# Patient Record
Sex: Female | Born: 1950
Health system: Southern US, Community
[De-identification: ages and names within clinical notes are randomized; demographics above are authoritative.]

## PROBLEM LIST (undated history)

## (undated) DIAGNOSIS — R7302 Impaired glucose tolerance (oral): Secondary | ICD-10-CM

## (undated) DIAGNOSIS — R599 Enlarged lymph nodes, unspecified: Secondary | ICD-10-CM

## (undated) DIAGNOSIS — G9341 Metabolic encephalopathy: Secondary | ICD-10-CM

## (undated) DIAGNOSIS — M25579 Pain in unspecified ankle and joints of unspecified foot: Secondary | ICD-10-CM

## (undated) DIAGNOSIS — H0259 Other disorders affecting eyelid function: Secondary | ICD-10-CM

## (undated) DIAGNOSIS — M2340 Loose body in knee, unspecified knee: Secondary | ICD-10-CM

## (undated) DIAGNOSIS — D869 Sarcoidosis, unspecified: Secondary | ICD-10-CM

## (undated) DIAGNOSIS — R0602 Shortness of breath: Secondary | ICD-10-CM

## (undated) DIAGNOSIS — J984 Other disorders of lung: Secondary | ICD-10-CM

## (undated) DIAGNOSIS — N189 Chronic kidney disease, unspecified: Secondary | ICD-10-CM

## (undated) DIAGNOSIS — E739 Lactose intolerance, unspecified: Secondary | ICD-10-CM

## (undated) DIAGNOSIS — G4710 Hypersomnia, unspecified: Secondary | ICD-10-CM

## (undated) DIAGNOSIS — F411 Generalized anxiety disorder: Secondary | ICD-10-CM

## (undated) DIAGNOSIS — M25469 Effusion, unspecified knee: Secondary | ICD-10-CM

## (undated) DIAGNOSIS — G4730 Sleep apnea, unspecified: Secondary | ICD-10-CM

## (undated) DIAGNOSIS — D649 Anemia, unspecified: Secondary | ICD-10-CM

## (undated) DIAGNOSIS — G43909 Migraine, unspecified, not intractable, without status migrainosus: Secondary | ICD-10-CM

## (undated) DIAGNOSIS — H409 Unspecified glaucoma: Secondary | ICD-10-CM

## (undated) DIAGNOSIS — Z86010 Personal history of colonic polyps: Secondary | ICD-10-CM

## (undated) DIAGNOSIS — M1990 Unspecified osteoarthritis, unspecified site: Secondary | ICD-10-CM

## (undated) DIAGNOSIS — R609 Edema, unspecified: Secondary | ICD-10-CM

## (undated) DIAGNOSIS — J302 Other seasonal allergic rhinitis: Secondary | ICD-10-CM

## (undated) DIAGNOSIS — M25569 Pain in unspecified knee: Secondary | ICD-10-CM

## (undated) DIAGNOSIS — K635 Polyp of colon: Secondary | ICD-10-CM

## (undated) DIAGNOSIS — M25519 Pain in unspecified shoulder: Secondary | ICD-10-CM

## (undated) DIAGNOSIS — F329 Major depressive disorder, single episode, unspecified: Secondary | ICD-10-CM

## (undated) DIAGNOSIS — T07XXXA Unspecified multiple injuries, initial encounter: Secondary | ICD-10-CM

## (undated) DIAGNOSIS — H269 Unspecified cataract: Secondary | ICD-10-CM

## (undated) DIAGNOSIS — R42 Dizziness and giddiness: Secondary | ICD-10-CM

## (undated) DIAGNOSIS — E785 Hyperlipidemia, unspecified: Secondary | ICD-10-CM

## (undated) DIAGNOSIS — I10 Essential (primary) hypertension: Secondary | ICD-10-CM

## (undated) DIAGNOSIS — R7303 Prediabetes: Secondary | ICD-10-CM

## (undated) HISTORY — DX: Metabolic encephalopathy: G93.41

## (undated) HISTORY — DX: Major depressive disorder, single episode, unspecified: F32.9

## (undated) HISTORY — PX: GUM SURGERY: SHX658

## (undated) HISTORY — PX: BREAST SURGERY: SHX581

## (undated) HISTORY — PX: OTHER SURGICAL HISTORY: SHX169

## (undated) HISTORY — PX: KNEE ARTHROSCOPY: SHX127

## (undated) HISTORY — DX: Enlarged lymph nodes, unspecified: R59.9

## (undated) HISTORY — DX: Morbid (severe) obesity due to excess calories: E66.01

## (undated) HISTORY — DX: Pain in unspecified ankle and joints of unspecified foot: M25.579

## (undated) HISTORY — DX: Essential (primary) hypertension: I10

## (undated) HISTORY — DX: Unspecified cataract: H26.9

## (undated) HISTORY — DX: Unspecified glaucoma: H40.9

## (undated) HISTORY — PX: COLONOSCOPY: SHX174

## (undated) HISTORY — PX: CATARACT EXTRACTION: SUR2

## (undated) HISTORY — DX: Edema, unspecified: R60.9

## (undated) HISTORY — DX: Pain in unspecified knee: M25.569

## (undated) HISTORY — DX: Effusion, unspecified knee: M25.469

## (undated) HISTORY — DX: Polyp of colon: K63.5

## (undated) HISTORY — DX: Hyperlipidemia, unspecified: E78.5

## (undated) HISTORY — DX: Chronic kidney disease, unspecified: N18.9

## (undated) HISTORY — DX: Other disorders of lung: J98.4

## (undated) HISTORY — DX: Lactose intolerance, unspecified: E73.9

## (undated) HISTORY — DX: Shortness of breath: R06.02

## (undated) HISTORY — DX: Generalized anxiety disorder: F41.1

## (undated) HISTORY — DX: Other seasonal allergic rhinitis: J30.2

## (undated) HISTORY — DX: Unspecified multiple injuries, initial encounter: T07.XXXA

## (undated) HISTORY — PX: FRACTURE SURGERY: SHX138

## (undated) HISTORY — DX: Dizziness and giddiness: R42

## (undated) HISTORY — DX: Sarcoidosis, unspecified: D86.9

## (undated) HISTORY — DX: Loose body in knee, unspecified knee: M23.40

## (undated) HISTORY — DX: Impaired glucose tolerance (oral): R73.02

## (undated) HISTORY — DX: Other disorders affecting eyelid function: H02.59

## (undated) HISTORY — DX: Pain in unspecified shoulder: M25.519

## (undated) HISTORY — PX: LYMPH NODE BIOPSY: SHX201

## (undated) HISTORY — DX: Personal history of colonic polyps: Z86.010

## (undated) HISTORY — DX: Hypersomnia, unspecified: G47.10

## (undated) HISTORY — DX: Hypercalcemia: E83.52

---

## 1992-07-16 HISTORY — PX: MYOMECTOMY: SHX85

## 1996-07-16 HISTORY — PX: HUMERUS SURGERY: SHX672

## 1998-04-16 ENCOUNTER — Emergency Department (HOSPITAL_COMMUNITY): Admission: EM | Admit: 1998-04-16 | Discharge: 1998-04-16 | Payer: Self-pay | Admitting: Emergency Medicine

## 1998-07-16 HISTORY — PX: REFRACTIVE SURGERY: SHX103

## 1998-08-16 HISTORY — PX: REFRACTIVE SURGERY: SHX103

## 1999-04-08 ENCOUNTER — Ambulatory Visit: Admission: RE | Admit: 1999-04-08 | Discharge: 1999-04-08 | Payer: Self-pay | Admitting: Internal Medicine

## 1999-04-18 ENCOUNTER — Other Ambulatory Visit: Admission: RE | Admit: 1999-04-18 | Discharge: 1999-04-18 | Payer: Self-pay | Admitting: Obstetrics and Gynecology

## 2000-07-25 ENCOUNTER — Other Ambulatory Visit: Admission: RE | Admit: 2000-07-25 | Discharge: 2000-07-25 | Payer: Self-pay | Admitting: Obstetrics and Gynecology

## 2002-06-16 ENCOUNTER — Ambulatory Visit (HOSPITAL_BASED_OUTPATIENT_CLINIC_OR_DEPARTMENT_OTHER): Admission: RE | Admit: 2002-06-16 | Discharge: 2002-06-16 | Payer: Self-pay | Admitting: Internal Medicine

## 2003-04-30 LAB — HM COLONOSCOPY

## 2004-02-25 ENCOUNTER — Other Ambulatory Visit: Admission: RE | Admit: 2004-02-25 | Discharge: 2004-02-25 | Payer: Self-pay | Admitting: Obstetrics and Gynecology

## 2004-05-23 ENCOUNTER — Ambulatory Visit: Payer: Self-pay | Admitting: Internal Medicine

## 2004-05-30 ENCOUNTER — Ambulatory Visit: Payer: Self-pay | Admitting: Internal Medicine

## 2004-09-04 ENCOUNTER — Ambulatory Visit: Payer: Self-pay | Admitting: Internal Medicine

## 2004-10-31 ENCOUNTER — Ambulatory Visit: Payer: Self-pay | Admitting: Internal Medicine

## 2005-02-26 ENCOUNTER — Other Ambulatory Visit: Admission: RE | Admit: 2005-02-26 | Discharge: 2005-02-26 | Payer: Self-pay | Admitting: Addiction Medicine

## 2005-07-24 ENCOUNTER — Ambulatory Visit: Payer: Self-pay | Admitting: Internal Medicine

## 2005-07-31 ENCOUNTER — Ambulatory Visit: Payer: Self-pay | Admitting: Internal Medicine

## 2005-08-07 ENCOUNTER — Encounter: Admission: RE | Admit: 2005-08-07 | Discharge: 2005-11-05 | Payer: Self-pay | Admitting: Psychiatry

## 2005-08-16 LAB — HM MAMMOGRAPHY: HM Mammogram: NORMAL

## 2005-11-15 ENCOUNTER — Encounter: Admission: RE | Admit: 2005-11-15 | Discharge: 2005-11-15 | Payer: Self-pay | Admitting: Psychiatry

## 2006-02-14 ENCOUNTER — Encounter: Admission: RE | Admit: 2006-02-14 | Discharge: 2006-05-15 | Payer: Self-pay | Admitting: Psychiatry

## 2006-06-05 ENCOUNTER — Encounter: Admission: RE | Admit: 2006-06-05 | Discharge: 2006-09-03 | Payer: Self-pay | Admitting: Psychiatry

## 2006-08-06 ENCOUNTER — Ambulatory Visit: Payer: Self-pay | Admitting: Internal Medicine

## 2006-08-06 LAB — CONVERTED CEMR LAB
ALT: 32 units/L (ref 0–40)
AST: 28 units/L (ref 0–37)
Alkaline Phosphatase: 83 units/L (ref 39–117)
Basophils Relative: 0.5 % (ref 0.0–1.0)
Bilirubin Urine: NEGATIVE
Calcium: 10.1 mg/dL (ref 8.4–10.5)
Chloride: 100 meq/L (ref 96–112)
Cholesterol: 180 mg/dL (ref 0–200)
Direct LDL: 69.7 mg/dL
Eosinophils Relative: 0 % (ref 0.0–5.0)
GFR calc Af Amer: 67 mL/min
GFR calc non Af Amer: 55 mL/min
HCT: 35.9 % — ABNORMAL LOW (ref 36.0–46.0)
HDL: 46.9 mg/dL (ref >39.0)
Hemoglobin, Urine: NEGATIVE
Leukocytes, UA: NEGATIVE
Lymphocytes Relative: 36.5 % (ref 12.0–46.0)
MCV: 81.9 fL (ref 78.0–100.0)
Monocytes Absolute: 0.5 10*3/uL (ref 0.2–0.7)
Platelets: 298 10*3/uL (ref 150–400)
Potassium: 4.2 meq/L (ref 3.5–5.1)
RBC: 4.38 M/uL (ref 3.87–5.11)
Sodium: 137 meq/L (ref 135–145)
Total Protein: 7.9 g/dL (ref 6.0–8.3)
Urobilinogen, UA: 0.2 (ref 0.0–1.0)
WBC: 5.5 10*3/uL (ref 4.5–10.5)

## 2006-08-19 ENCOUNTER — Ambulatory Visit: Payer: Self-pay | Admitting: Internal Medicine

## 2006-10-08 ENCOUNTER — Encounter: Admission: RE | Admit: 2006-10-08 | Discharge: 2007-01-06 | Payer: Self-pay | Admitting: Psychiatry

## 2007-01-28 ENCOUNTER — Encounter: Admission: RE | Admit: 2007-01-28 | Discharge: 2007-01-28 | Payer: Self-pay | Admitting: Psychiatry

## 2007-03-25 ENCOUNTER — Encounter: Admission: RE | Admit: 2007-03-25 | Discharge: 2007-03-25 | Payer: Self-pay | Admitting: Psychiatry

## 2007-04-17 ENCOUNTER — Encounter: Payer: Self-pay | Admitting: Internal Medicine

## 2007-04-17 DIAGNOSIS — F32A Depression, unspecified: Secondary | ICD-10-CM | POA: Insufficient documentation

## 2007-04-17 DIAGNOSIS — F3289 Other specified depressive episodes: Secondary | ICD-10-CM

## 2007-04-17 DIAGNOSIS — I10 Essential (primary) hypertension: Secondary | ICD-10-CM | POA: Insufficient documentation

## 2007-04-17 DIAGNOSIS — G43909 Migraine, unspecified, not intractable, without status migrainosus: Secondary | ICD-10-CM | POA: Insufficient documentation

## 2007-04-17 DIAGNOSIS — F329 Major depressive disorder, single episode, unspecified: Secondary | ICD-10-CM

## 2007-04-17 DIAGNOSIS — F411 Generalized anxiety disorder: Secondary | ICD-10-CM | POA: Insufficient documentation

## 2007-04-17 HISTORY — DX: Other specified depressive episodes: F32.89

## 2007-04-17 HISTORY — DX: Essential (primary) hypertension: I10

## 2007-04-17 HISTORY — DX: Generalized anxiety disorder: F41.1

## 2007-04-17 HISTORY — DX: Major depressive disorder, single episode, unspecified: F32.9

## 2007-04-20 HISTORY — DX: Morbid (severe) obesity due to excess calories: E66.01

## 2007-06-24 ENCOUNTER — Encounter: Admission: RE | Admit: 2007-06-24 | Discharge: 2007-06-25 | Payer: Self-pay | Admitting: Psychiatry

## 2007-07-17 HISTORY — PX: COMBINED MEDIASTINOSCOPY AND BRONCHOSCOPY: SUR303

## 2007-08-01 ENCOUNTER — Ambulatory Visit: Payer: Self-pay | Admitting: Internal Medicine

## 2007-08-01 ENCOUNTER — Encounter: Payer: Self-pay | Admitting: Internal Medicine

## 2007-08-01 DIAGNOSIS — E739 Lactose intolerance, unspecified: Secondary | ICD-10-CM

## 2007-08-01 DIAGNOSIS — Z860100 Personal history of colon polyps, unspecified: Secondary | ICD-10-CM

## 2007-08-01 DIAGNOSIS — R42 Dizziness and giddiness: Secondary | ICD-10-CM

## 2007-08-01 DIAGNOSIS — E785 Hyperlipidemia, unspecified: Secondary | ICD-10-CM

## 2007-08-01 DIAGNOSIS — J984 Other disorders of lung: Secondary | ICD-10-CM

## 2007-08-01 DIAGNOSIS — R0602 Shortness of breath: Secondary | ICD-10-CM | POA: Insufficient documentation

## 2007-08-01 HISTORY — DX: Shortness of breath: R06.02

## 2007-08-01 HISTORY — DX: Personal history of colonic polyps: Z86.010

## 2007-08-01 HISTORY — DX: Lactose intolerance, unspecified: E73.9

## 2007-08-01 HISTORY — DX: Personal history of colon polyps, unspecified: Z86.0100

## 2007-08-01 HISTORY — DX: Hyperlipidemia, unspecified: E78.5

## 2007-08-01 HISTORY — DX: Dizziness and giddiness: R42

## 2007-08-01 HISTORY — DX: Other disorders of lung: J98.4

## 2007-08-11 ENCOUNTER — Encounter: Payer: Self-pay | Admitting: Internal Medicine

## 2007-08-11 ENCOUNTER — Ambulatory Visit: Payer: Self-pay | Admitting: Cardiology

## 2007-08-11 ENCOUNTER — Ambulatory Visit: Payer: Self-pay

## 2007-08-13 ENCOUNTER — Encounter: Payer: Self-pay | Admitting: Internal Medicine

## 2007-08-13 DIAGNOSIS — R599 Enlarged lymph nodes, unspecified: Secondary | ICD-10-CM | POA: Insufficient documentation

## 2007-08-13 HISTORY — DX: Enlarged lymph nodes, unspecified: R59.9

## 2007-08-15 ENCOUNTER — Ambulatory Visit: Payer: Self-pay | Admitting: Internal Medicine

## 2007-08-16 LAB — CONVERTED CEMR LAB
Alkaline Phosphatase: 104 units/L (ref 39–117)
BUN: 13 mg/dL (ref 6–23)
Basophils Relative: 1.3 % — ABNORMAL HIGH (ref 0.0–1.0)
Bilirubin Urine: NEGATIVE
Bilirubin, Direct: 0.1 mg/dL (ref 0.0–0.3)
CO2: 31 meq/L (ref 19–32)
Calcium: 9.8 mg/dL (ref 8.4–10.5)
Cholesterol: 176 mg/dL (ref 0–200)
Eosinophils Absolute: 0.2 10*3/uL (ref 0.0–0.6)
GFR calc Af Amer: 74 mL/min
GFR calc non Af Amer: 61 mL/min
HDL: 42.2 mg/dL (ref >39.0)
Hemoglobin, Urine: NEGATIVE
Hemoglobin: 12.5 g/dL (ref 12.0–15.0)
Leukocytes, UA: NEGATIVE
Lymphocytes Relative: 20.7 % (ref 12.0–46.0)
MCHC: 34.2 g/dL (ref 30.0–36.0)
MCV: 83.8 fL (ref 78.0–100.0)
Monocytes Absolute: 0.7 10*3/uL (ref 0.2–0.7)
Monocytes Relative: 12.8 % — ABNORMAL HIGH (ref 3.0–11.0)
Neutro Abs: 3.1 10*3/uL (ref 1.4–7.7)
Platelets: 247 10*3/uL (ref 150–400)
Potassium: 4.3 meq/L (ref 3.5–5.1)
Specific Gravity, Urine: <=1.005 (ref 1.000–1.03)
TSH: 6.05 microintl units/mL — ABNORMAL HIGH (ref 0.35–5.50)
Total Protein, Urine: NEGATIVE mg/dL
Total Protein: 7.2 g/dL (ref 6.0–8.3)
Triglycerides: 201 mg/dL (ref 0–149)
Urine Glucose: NEGATIVE mg/dL

## 2007-08-17 HISTORY — PX: COMBINED MEDIASTINOSCOPY AND BRONCHOSCOPY: SUR303

## 2007-08-19 ENCOUNTER — Ambulatory Visit: Payer: Self-pay | Admitting: Internal Medicine

## 2007-08-19 ENCOUNTER — Ambulatory Visit: Payer: Self-pay | Admitting: Thoracic Surgery

## 2007-08-26 ENCOUNTER — Ambulatory Visit: Payer: Self-pay | Admitting: Internal Medicine

## 2007-08-26 ENCOUNTER — Encounter: Payer: Self-pay | Admitting: Internal Medicine

## 2007-09-05 ENCOUNTER — Encounter: Payer: Self-pay | Admitting: Thoracic Surgery

## 2007-09-05 ENCOUNTER — Encounter: Payer: Self-pay | Admitting: Internal Medicine

## 2007-09-05 ENCOUNTER — Ambulatory Visit (HOSPITAL_COMMUNITY): Admission: RE | Admit: 2007-09-05 | Discharge: 2007-09-05 | Payer: Self-pay | Admitting: Thoracic Surgery

## 2007-09-08 ENCOUNTER — Ambulatory Visit: Payer: Self-pay | Admitting: Thoracic Surgery

## 2007-09-09 ENCOUNTER — Ambulatory Visit: Payer: Self-pay | Admitting: Thoracic Surgery

## 2007-09-09 ENCOUNTER — Encounter: Payer: Self-pay | Admitting: Internal Medicine

## 2007-09-16 ENCOUNTER — Encounter: Admission: RE | Admit: 2007-09-16 | Discharge: 2007-09-16 | Payer: Self-pay | Admitting: Psychiatry

## 2007-09-25 ENCOUNTER — Ambulatory Visit: Payer: Self-pay | Admitting: Internal Medicine

## 2007-09-25 DIAGNOSIS — D869 Sarcoidosis, unspecified: Secondary | ICD-10-CM | POA: Insufficient documentation

## 2007-09-25 HISTORY — DX: Sarcoidosis, unspecified: D86.9

## 2007-09-30 ENCOUNTER — Ambulatory Visit: Payer: Self-pay | Admitting: Internal Medicine

## 2007-09-30 LAB — CONVERTED CEMR LAB
ALT: 30 units/L (ref 0–35)
Albumin: 3.7 g/dL (ref 3.5–5.2)
Angiotensin 1 Converting Enzyme: 103 units/L — ABNORMAL HIGH (ref 9–67)
Bilirubin, Direct: 0.1 mg/dL (ref 0.0–0.3)

## 2007-10-01 ENCOUNTER — Ambulatory Visit: Payer: Self-pay | Admitting: Thoracic Surgery

## 2007-10-02 ENCOUNTER — Ambulatory Visit: Payer: Self-pay | Admitting: Internal Medicine

## 2007-10-02 ENCOUNTER — Encounter: Payer: Self-pay | Admitting: Internal Medicine

## 2007-10-31 ENCOUNTER — Ambulatory Visit: Payer: Self-pay | Admitting: Internal Medicine

## 2007-11-02 ENCOUNTER — Encounter: Payer: Self-pay | Admitting: Internal Medicine

## 2007-11-25 ENCOUNTER — Encounter: Payer: Self-pay | Admitting: Internal Medicine

## 2007-12-05 ENCOUNTER — Ambulatory Visit: Payer: Self-pay | Admitting: Internal Medicine

## 2007-12-30 ENCOUNTER — Encounter: Admission: RE | Admit: 2007-12-30 | Discharge: 2007-12-30 | Payer: Self-pay | Admitting: Psychiatry

## 2008-02-10 ENCOUNTER — Ambulatory Visit: Payer: Self-pay | Admitting: Internal Medicine

## 2008-04-01 ENCOUNTER — Ambulatory Visit: Payer: Self-pay | Admitting: Internal Medicine

## 2008-04-01 DIAGNOSIS — M25579 Pain in unspecified ankle and joints of unspecified foot: Secondary | ICD-10-CM

## 2008-04-01 HISTORY — DX: Pain in unspecified ankle and joints of unspecified foot: M25.579

## 2008-04-21 ENCOUNTER — Encounter: Admission: RE | Admit: 2008-04-21 | Discharge: 2008-04-21 | Payer: Self-pay | Admitting: Psychiatry

## 2008-05-08 ENCOUNTER — Encounter: Payer: Self-pay | Admitting: Internal Medicine

## 2008-05-12 ENCOUNTER — Ambulatory Visit: Payer: Self-pay | Admitting: Internal Medicine

## 2008-05-24 ENCOUNTER — Ambulatory Visit: Payer: Self-pay | Admitting: Obstetrics and Gynecology

## 2008-05-24 ENCOUNTER — Other Ambulatory Visit: Admission: RE | Admit: 2008-05-24 | Discharge: 2008-05-24 | Payer: Self-pay | Admitting: Obstetrics and Gynecology

## 2008-05-24 ENCOUNTER — Encounter: Payer: Self-pay | Admitting: Obstetrics and Gynecology

## 2008-06-22 ENCOUNTER — Ambulatory Visit: Payer: Self-pay | Admitting: Obstetrics and Gynecology

## 2008-06-25 ENCOUNTER — Ambulatory Visit (HOSPITAL_BASED_OUTPATIENT_CLINIC_OR_DEPARTMENT_OTHER): Admission: RE | Admit: 2008-06-25 | Discharge: 2008-06-25 | Payer: Self-pay | Admitting: Internal Medicine

## 2008-06-25 ENCOUNTER — Encounter: Payer: Self-pay | Admitting: Internal Medicine

## 2008-07-08 ENCOUNTER — Encounter: Payer: Self-pay | Admitting: Internal Medicine

## 2008-07-10 ENCOUNTER — Ambulatory Visit: Payer: Self-pay | Admitting: Internal Medicine

## 2008-07-13 ENCOUNTER — Ambulatory Visit: Payer: Self-pay | Admitting: Internal Medicine

## 2008-07-28 DIAGNOSIS — G4710 Hypersomnia, unspecified: Secondary | ICD-10-CM

## 2008-07-28 HISTORY — DX: Hypersomnia, unspecified: G47.10

## 2008-10-04 ENCOUNTER — Ambulatory Visit: Payer: Self-pay | Admitting: Internal Medicine

## 2008-10-21 ENCOUNTER — Ambulatory Visit: Payer: Self-pay | Admitting: Internal Medicine

## 2008-10-21 LAB — CONVERTED CEMR LAB
ALT: 29 units/L (ref 0–35)
AST: 23 units/L (ref 0–37)
Albumin: 3.8 g/dL (ref 3.5–5.2)
Basophils Relative: 0.4 % (ref 0.0–3.0)
Chloride: 95 meq/L — ABNORMAL LOW (ref 96–112)
Cholesterol: 157 mg/dL (ref 0–200)
Eosinophils Relative: 3.2 % (ref 0.0–5.0)
GFR calc non Af Amer: 78.89 mL/min (ref >60)
HCT: 40.9 % (ref 36.0–46.0)
Hemoglobin: 14.2 g/dL (ref 12.0–15.0)
Lymphs Abs: 1 10*3/uL (ref 0.7–4.0)
Monocytes Relative: 11.9 % (ref 3.0–12.0)
Neutro Abs: 5.2 10*3/uL (ref 1.4–7.7)
Potassium: 4 meq/L (ref 3.5–5.1)
Sodium: 136 meq/L (ref 135–145)
Specific Gravity, Urine: <=1.005 (ref 1.000–1.030)
Total Bilirubin: 0.5 mg/dL (ref 0.3–1.2)
Total CHOL/HDL Ratio: 3
Total Protein: 7.1 g/dL (ref 6.0–8.3)
Urine Glucose: NEGATIVE mg/dL
Urobilinogen, UA: 0.2 (ref 0.0–1.0)
WBC: 7.3 10*3/uL (ref 4.5–10.5)

## 2008-10-22 LAB — CONVERTED CEMR LAB: Vit D, 25-Hydroxy: 44 ng/mL (ref 30–89)

## 2008-11-09 ENCOUNTER — Telehealth: Payer: Self-pay | Admitting: Internal Medicine

## 2008-11-10 ENCOUNTER — Telehealth: Payer: Self-pay | Admitting: Internal Medicine

## 2008-11-18 ENCOUNTER — Telehealth (INDEPENDENT_AMBULATORY_CARE_PROVIDER_SITE_OTHER): Payer: Self-pay | Admitting: *Deleted

## 2009-04-01 ENCOUNTER — Encounter: Payer: Self-pay | Admitting: Internal Medicine

## 2009-04-01 ENCOUNTER — Ambulatory Visit: Payer: Self-pay | Admitting: Internal Medicine

## 2009-04-01 DIAGNOSIS — M25519 Pain in unspecified shoulder: Secondary | ICD-10-CM | POA: Insufficient documentation

## 2009-04-01 DIAGNOSIS — T07XXXA Unspecified multiple injuries, initial encounter: Secondary | ICD-10-CM | POA: Insufficient documentation

## 2009-04-01 DIAGNOSIS — M25569 Pain in unspecified knee: Secondary | ICD-10-CM

## 2009-04-01 DIAGNOSIS — M2340 Loose body in knee, unspecified knee: Secondary | ICD-10-CM

## 2009-04-01 HISTORY — DX: Pain in unspecified shoulder: M25.519

## 2009-04-01 HISTORY — DX: Pain in unspecified knee: M25.569

## 2009-04-01 HISTORY — DX: Loose body in knee, unspecified knee: M23.40

## 2009-04-01 HISTORY — DX: Unspecified multiple injuries, initial encounter: T07.XXXA

## 2009-04-04 ENCOUNTER — Encounter: Payer: Self-pay | Admitting: Internal Medicine

## 2009-04-06 ENCOUNTER — Encounter: Payer: Self-pay | Admitting: Internal Medicine

## 2009-04-21 ENCOUNTER — Ambulatory Visit: Payer: Self-pay | Admitting: Internal Medicine

## 2009-04-21 DIAGNOSIS — R609 Edema, unspecified: Secondary | ICD-10-CM

## 2009-04-21 HISTORY — DX: Edema, unspecified: R60.9

## 2009-04-21 LAB — CONVERTED CEMR LAB
BUN: 14 mg/dL (ref 6–23)
Basophils Absolute: 0 10*3/uL (ref 0.0–0.1)
Bilirubin, Direct: 0 mg/dL (ref 0.0–0.3)
CO2: 34 meq/L — ABNORMAL HIGH (ref 19–32)
Chloride: 106 meq/L (ref 96–112)
Creatinine, Ser: 0.8 mg/dL (ref 0.4–1.2)
Eosinophils Absolute: 0 10*3/uL (ref 0.0–0.7)
MCHC: 34.2 g/dL (ref 30.0–36.0)
MCV: 83.2 fL (ref 78.0–100.0)
Monocytes Absolute: 0.7 10*3/uL (ref 0.1–1.0)
Neutrophils Relative %: 64.2 % (ref 43.0–77.0)
Nitrite: NEGATIVE
Platelets: 249 10*3/uL (ref 150.0–400.0)
Total Bilirubin: 0.5 mg/dL (ref 0.3–1.2)
Total Protein, Urine: NEGATIVE mg/dL
WBC: 5.4 10*3/uL (ref 4.5–10.5)
pH: 7 (ref 5.0–8.0)

## 2009-05-03 ENCOUNTER — Encounter: Payer: Self-pay | Admitting: Internal Medicine

## 2009-05-03 ENCOUNTER — Ambulatory Visit: Payer: Self-pay

## 2009-05-03 ENCOUNTER — Ambulatory Visit: Payer: Self-pay | Admitting: Internal Medicine

## 2009-05-03 ENCOUNTER — Ambulatory Visit (HOSPITAL_COMMUNITY): Admission: RE | Admit: 2009-05-03 | Discharge: 2009-05-03 | Payer: Self-pay | Admitting: Internal Medicine

## 2009-05-12 ENCOUNTER — Ambulatory Visit: Payer: Self-pay | Admitting: Internal Medicine

## 2009-05-25 ENCOUNTER — Telehealth (INDEPENDENT_AMBULATORY_CARE_PROVIDER_SITE_OTHER): Payer: Self-pay | Admitting: *Deleted

## 2009-06-13 ENCOUNTER — Encounter: Payer: Self-pay | Admitting: Internal Medicine

## 2009-11-03 ENCOUNTER — Ambulatory Visit: Payer: Self-pay | Admitting: Internal Medicine

## 2009-11-10 ENCOUNTER — Ambulatory Visit: Payer: Self-pay | Admitting: Internal Medicine

## 2009-11-10 LAB — CONVERTED CEMR LAB
ALT: 35 units/L (ref 0–35)
AST: 27 units/L (ref 0–37)
Albumin: 3.9 g/dL (ref 3.5–5.2)
Alkaline Phosphatase: 106 units/L (ref 39–117)
Basophils Relative: 0.6 % (ref 0.0–3.0)
CO2: 35 meq/L — ABNORMAL HIGH (ref 19–32)
Calcium: 9.5 mg/dL (ref 8.4–10.5)
Chloride: 100 meq/L (ref 96–112)
Direct LDL: 86.1 mg/dL
Eosinophils Absolute: 0 10*3/uL (ref 0.0–0.7)
Eosinophils Relative: 0 % (ref 0.0–5.0)
HDL: 42.1 mg/dL (ref >39.00)
Hemoglobin: 14.4 g/dL (ref 12.0–15.0)
Leukocytes, UA: NEGATIVE
Lymphocytes Relative: 27.7 % (ref 12.0–46.0)
MCHC: 34 g/dL (ref 30.0–36.0)
Neutro Abs: 3.7 10*3/uL (ref 1.4–7.7)
Nitrite: NEGATIVE
RBC: 5.11 M/uL (ref 3.87–5.11)
Sodium: 141 meq/L (ref 135–145)
Specific Gravity, Urine: <=1.005 (ref 1.000–1.030)
TSH: 2.67 microintl units/mL (ref 0.35–5.50)
Total Protein, Urine: NEGATIVE mg/dL
Total Protein: 7.2 g/dL (ref 6.0–8.3)
WBC: 6 10*3/uL (ref 4.5–10.5)
pH: 7 (ref 5.0–8.0)

## 2010-04-06 ENCOUNTER — Telehealth (INDEPENDENT_AMBULATORY_CARE_PROVIDER_SITE_OTHER): Payer: Self-pay | Admitting: *Deleted

## 2010-06-02 ENCOUNTER — Ambulatory Visit: Payer: Self-pay | Admitting: Internal Medicine

## 2010-06-02 DIAGNOSIS — M25469 Effusion, unspecified knee: Secondary | ICD-10-CM

## 2010-06-02 HISTORY — DX: Effusion, unspecified knee: M25.469

## 2010-06-13 ENCOUNTER — Encounter: Admission: RE | Admit: 2010-06-13 | Discharge: 2010-06-13 | Payer: Self-pay | Admitting: Internal Medicine

## 2010-07-16 HISTORY — PX: KNEE SURGERY: SHX244

## 2010-07-31 ENCOUNTER — Encounter: Payer: Self-pay | Admitting: Internal Medicine

## 2010-08-13 LAB — CONVERTED CEMR LAB: Pap Smear: NORMAL

## 2010-08-17 NOTE — Assessment & Plan Note (Signed)
Summary: left knee problem/#/cd   Vital Signs:  Patient profile:   60 year old female Height:      66 inches Weight:      241 pounds BMI:     39.04 O2 Sat:      97 % on Room air Temp:     97.4 degrees F oral Pulse rate:   58 / minute BP sitting:   100 / 64  (left arm) Cuff size:   large  Vitals Entered By: Shirlean Mylar Ewing CMA ([place]) (June 02, 2010 3:20 PM)  O2 Flow:  Room air CC: Left knee pain/RE   Primary Care Adylene Dlugosz:  Cathlean Cower  CC:  Left knee pain/RE.  History of Present Illness: here with c/o left knee pain and sweling, mild to mod over 4 wks, without obvious trauma, twisting, fever, or hx of gout. Denies increaed activity such as more standing, walking, or other wt bearing excercise.  Does have significant click/catch but no locking, with pain to the anterolat aspect, and some sense of tendency to give away over the past wk.  No falls, injury.   Pt denies CP, worsening sob, doe, wheezing, orthopnea, pnd, worsening LE edema, palps, dizziness or syncope  Pt denies new neuro symptoms such as headache, facial or extremity weakness  Pt denies polydipsia, polyuria.   Overall good compliance with meds, trying to follow low chol, DM diet with some indiscretions, wt down 5 lbs from last visit, little excercise however  Denies worsening depressive symptoms, suicidal ideation, or panic., and good med tolerance, overall improved recently with addition of abilty.    Problems Prior to Update: 1)  Joint Effusion, Left Knee  (ICD-719.06) 2)  Knee Pain, Left  (ICD-719.46) 3)  Peripheral Edema  (ICD-782.3) 4)  Loose Body in Knee  (ICD-717.6) 5)  Contusions, Multiple  (ICD-924.8) 6)  Shoulder Pain, Left  (ICD-719.41) 7)  Knee Pain, Left, Acute  (ICD-719.46) 8)  Preventive Health Care  (ICD-V70.0) 9)  Hypersomnia  (ICD-780.54) 10)  Ankle Pain, Left  (ICD-719.47) 11)  Sarcoidosis  (ICD-135) 12)  Preventive Health Care  (ICD-V70.0) 13)  Routine General Medical Exam@health  Care Facl   (ICD-V70.0) 14)  Enlargement of Lymph Nodes  (ICD-785.6) 15)  Colonic Polyps, Hx of  (ICD-V12.72) 16)  Hyperlipidemia  (ICD-272.4) 17)  Other Diseases of Lung Not Elsewhere Classified  (ICD-518.89) 18)  Dizziness  (ICD-780.4) 19)  Dyspnea  (ICD-786.05) 20)  Glucose Intolerance  (ICD-271.3) 21)  Morbid Obesity  (ICD-278.01) 22)  Migraines, Hx of  (ICD-V13.8) 23)  Hypertension  (ICD-401.9) 24)  Depression  (ICD-311) 25)  Anxiety  (ICD-300.00)  Medications Prior to Update: 1)  Diltiazem Hcl Coated Beads 240 Mg Cp24 (Diltiazem Hcl Coated Beads) .... Take 1 Capsule By Mouth Once A Day 2)  Diovan 320 Mg Tabs (Valsartan) .Marland Kitchen.. 1 By Mouth Once Daily 3)  Tenormin 50 Mg Tabs (Atenolol) .... Take 1 Tablet By Mouth Every Night 4)  Wellbutrin Sr 100 Mg  Tb12 (Bupropion Hcl) .... 3 By Mouth Qd 5)  Clorazepate Dipotassium 7.5 Mg  Tabs (Clorazepate Dipotassium) .Marland Kitchen.. 1 By Mouth Qid 6)  Flexeril 10 Mg  Tabs (Cyclobenzaprine Hcl) .Marland Kitchen.. 1po Once Daily Prn 7)  Ecotrin Low Strength 81 Mg  Tbec (Aspirin) .Marland Kitchen.. 1 By Mouth Qd 8)  Cpap 5 Cwp [facility] 9)  Lexapro 10 Mg Tabs (Escitalopram Oxalate) .Marland Kitchen.. 1po Once Daily 10)  Furosemide 40 Mg Tabs (Furosemide) .Marland Kitchen.. 1po Once Daily  Current Medications (verified): 1)  Diltiazem Hcl Coated  Beads 240 Mg Cp24 (Diltiazem Hcl Coated Beads) .... Take 1 Capsule By Mouth Once A Day 2)  Diovan 320 Mg Tabs (Valsartan) .Marland Kitchen.. 1 By Mouth Once Daily 3)  Tenormin 50 Mg Tabs (Atenolol) .... Take 1 Tablet By Mouth Every Night 4)  Wellbutrin Sr 100 Mg  Tb12 (Bupropion Hcl) .Marland Kitchen.. 1 By Mouth Two Times A Day 5)  Clorazepate Dipotassium 7.5 Mg  Tabs (Clorazepate Dipotassium) .Marland Kitchen.. 1 By Mouth Qid 6)  Flexeril 10 Mg  Tabs (Cyclobenzaprine Hcl) .Marland Kitchen.. 1po Once Daily Prn 7)  Ecotrin Low Strength 81 Mg  Tbec (Aspirin) .Marland Kitchen.. 1 By Mouth Qd 8)  Cpap 5 Cwp [facility] 9)  Lexapro 10 Mg Tabs (Escitalopram Oxalate) .Marland Kitchen.. 1po Once Daily 10)  Furosemide 40 Mg Tabs (Furosemide) .Marland Kitchen.. 1po Once Daily 11)  Tramadol Hcl  50 Mg Tabs (Tramadol Hcl) .Marland Kitchen.. 1po Once Daily 12)  Abilify 5 Mg Tabs (Aripiprazole) .... 1/2 By Mouth Once Daily  Allergies (verified): 1)  ! Sulfa 2)  ! Prozac  Past History:  Past Medical History: Last updated: 05/12/2009 Anxiety Depression Hypertension Migraine Headache OSA- NPSG 04/08/99 AHI 15/hr, CPAP 9/ AHI 3/hr.  Morbid Obesity glucose intolerance Hyperlipidemia Colonic polyps, hx of sarcoid by med. 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   Head:  normocephalic and atraumatic.   Eyes:  vision grossly intact, pupils equal, and pupils round.   Ears:  R ear normal and L ear normal.   Nose:  no external deformity and no nasal discharge.   Mouth:  no gingival abnormalities and pharynx pink and moist.   Neck:  supple and no masses.   Lungs:  normal respiratory effort and normal breath sounds.   Heart:  normal rate and regular rhythm.   Msk:  left knee with tender anterolat aspect with 2+ diffuse knee swelling, mild crepitus and mild decreased ROM Extremities:  no edema, no erythema  Psych:  not depressed appearing and slightly anxious.     Impression & Recommendations:  Problem # 1:  KNEE PAIN, LEFT (ICD-719.46)  Her updated medication list for this problem includes:    Flexeril 10 Mg Tabs (Cyclobenzaprine hcl) .Marland Kitchen... 1po once daily prn    Ecotrin Low Strength 81 Mg Tbec (Aspirin) .Marland Kitchen... 1 by mouth qd     Tramadol Hcl 50 Mg Tabs (Tramadol hcl) .Marland Kitchen... 1po once daily with marked effusion and pain anterolat with click - cant r/o recurrent cartilage damage - for MRI left knee, refer Dr Carollee Leitz whom she has seen in the past for the right knee  Orders: Radiology Referral (Radiology) Orthopedic Surgeon Referral (Ortho Surgeon)  Problem # 2:  HYPERTENSION (ICD-401.9)  Her updated medication list for this problem includes:    Diltiazem Hcl Coated Beads 240 Mg Cp24 (Diltiazem hcl coated beads) .Marland Kitchen... Take 1 capsule by mouth once a day    Diovan 320 Mg Tabs (Valsartan) .Marland Kitchen... 1 by mouth once daily    Tenormin 50 Mg Tabs (Atenolol) .Marland Kitchen... Take  1 tablet by mouth every night    Furosemide 40 Mg Tabs (Furosemide) .Marland Kitchen... 1po once daily  BP today: 100/64 Prior BP: 100/70 (11/10/2009)  Labs Reviewed: K+: 4.4 (11/10/2009) Creat: : 0.9 (11/10/2009)   Chol: 220 (11/10/2009)   HDL: 42.10 (11/10/2009)   LDL: DEL (08/15/2007)   TG: 345.0 (11/10/2009) stable overall by hx and exam, ok to continue meds/tx as is   Problem # 3:  DEPRESSION (ICD-311)  Her updated medication list for this problem includes:    Wellbutrin Sr 100 Mg Tb12 (Bupropion hcl) .Marland Kitchen... 1 by mouth two times a day    Clorazepate Dipotassium 7.5 Mg Tabs (Clorazepate dipotassium) .Marland Kitchen... 1 by mouth qid    Lexapro 10 Mg Tabs (Escitalopram oxalate) .Marland Kitchen... 1po once daily stable overall by hx and exam, ok to continue meds/tx as is , to cont to see her psychiatrist,  declines counseling at this time  Complete Medication List: 1)  Diltiazem Hcl Coated Beads 240 Mg Cp24 (Diltiazem hcl coated beads) .... Take 1 capsule by mouth once a day 2)  Diovan 320 Mg Tabs (Valsartan) .Marland Kitchen.. 1 by mouth once daily 3)  Tenormin 50 Mg Tabs (Atenolol) .... Take 1 tablet by mouth every night 4)  Wellbutrin Sr 100 Mg Tb12 (Bupropion hcl) .Marland Kitchen.. 1 by mouth two times a day 5)  Clorazepate Dipotassium 7.5 Mg Tabs (Clorazepate dipotassium) .Marland Kitchen.. 1 by mouth qid 6)  Flexeril 10 Mg  Tabs (Cyclobenzaprine hcl) .Marland Kitchen.. 1po once daily prn 7)  Ecotrin Low Strength 81 Mg Tbec (Aspirin) .Marland Kitchen.. 1 by mouth qd 8)  Cpap 5 C[facility]  9)  Lexapro 10 Mg Tabs (Escitalopram oxalate) .Marland Kitchen.. 1po once daily 10)  Furosemide 40 Mg Tabs (Furosemide) .Marland Kitchen.. 1po once daily 11)  Tramadol Hcl 50 Mg Tabs (Tramadol hcl) .Marland Kitchen.. 1po once daily 12)  Abilify 5 Mg Tabs (Aripiprazole) .... 1/2 by mouth once daily  Patient Instructions: 1)  Please take all new medications as prescribed 2)  Continue all previous medications as before this visit  3)  You will be contacted about the referral(s) to: MRI for the left knee, and Dr Collins/ortho 4)  Please schedule a follow-up appointment as needed. Prescriptions: TRAMADOL HCL 50 MG TABS (TRAMADOL HCL) 1po once daily  #60 x 2   Entered and Authorized by:   Biagio Borg MD   Signed by:   Biagio Borg MD on 06/02/2010   Method used:   Print then Give to Patient   RxID:   [id number]    Orders Added: 1)  Radiology Referral [Radiology] 2)  Orthopedic Surgeon Referral [Ortho Surgeon] 3)  Est. Patient Level IV [id number]

## 2010-08-17 NOTE — Assessment & Plan Note (Signed)
Summary: 60min walk  Nurse Visit   Vital Signs:  Patient Profile:   60 Years Old Female Pulse rate:   56 / minute BP sitting:   124 / 64                 Prior Medications: DILTIAZEM HCL COATED BEADS 240 MG CP24 (DILTIAZEM HCL COATED BEADS) Take 1 capsule by mouth once a day DIOVAN HCT 320-25 MG TABS (VALSARTAN-HYDROCHLOROTHIAZIDE) 1 by mouth qd TENORMIN 50 MG TABS (ATENOLOL) Take 1 tablet by mouth every night LEXAPRO 20 MG  TABS (ESCITALOPRAM OXALATE) 1po qd WELLBUTRIN SR 100 MG  TB12 (BUPROPION HCL) 3 by mouth qd KEPPRA 500 MG  TABS (LEVETIRACETAM) 3 by mouth qd CLORAZEPATE DIPOTASSIUM 7.5 MG  TABS (CLORAZEPATE DIPOTASSIUM) 1 by mouth qid FLEXERIL 10 MG  TABS (CYCLOBENZAPRINE HCL) 1po once daily prn ECOTRIN LOW STRENGTH 81 MG  TBEC (ASPIRIN) 1 by mouth qd Current Allergies: ! SULFA ! PROZAC    Orders Added: 1)  Pulmonary Stress (6 min walk) [email or web address]    ]  Six Minute Walk Test Medications taken before test(dose and time): diova, wellbutrin, clorazepate, aspirin, flexeril, and lexapro taken at 8am Supplemental oxygen during the test: No  Lap counter(place a tick mark inside a square for each lap completed) lap 1 complete  lap 2 complete   lap 3 complete   lap 4 complete  lap 5 complete  lap 6 complete  lap 7 complete   lap 8 complete   lap 9 complete  lap 10 complete  lap 11 complete   lap 12 complete   Baseline  BP sitting: 124/ 64 Heart rate: 56 Dyspnea ( Borg scale) 0.5 Fatigue (Borg scale) 4 SPO2 96  End Of Test  BP sitting: 150/ 76 Heart rate: 89 Dyspnea ( Borg scale) 2 Fatigue (Borg scale) 4 SPO2 95  2 Minutes post  BP sitting: 126/ 72 Heart rate: 73 SPO2 94  Stopped or paused before six minutes? No  Interpretation: Number of laps  12 X 48 meters =   576 meters+ final partial lap: 24 meters =    600 meters   Total distance walked in six minutes: 600 meters  Tech ID: Parke Poisson CNA (October 02, 2007 2:13 PM) Tech Comments pt  completed 6 min walk with no rest breaks and no complaints at end of test.

## 2010-08-17 NOTE — Consult Note (Signed)
Summary: [facility]  [facility] PA   Imported By: Rise Patience 08/09/2010 14:10:32  _____________________________________________________________________  External Attachment:    Type:   Image     Comment:   External Document

## 2010-08-17 NOTE — Assessment & Plan Note (Signed)
Summary: f/u appt/cd   Vital Signs:  Patient profile:   60 year old female Height:      66 inches Weight:      246.50 pounds BMI:     39.93 O2 Sat:      97 % on Room air Temp:     97.5 degrees F oral Pulse rate:   56 / minute BP sitting:   100 / 70  (left arm) Cuff size:   large  Vitals Entered ByShirlean Mylar Ewing (November 10, 2009 2:29 PM)  O2 Flow:  Room air  Preventive Care Screening  Bone Density:    Next Due:  06/2010  CC: followoup/RE   Primary Care Provider:  Cathlean Cower  CC:  followoup/RE.  History of Present Illness: overall doing well, Pt denies CP, sob, doe, wheezing, orthopnea, pnd, worsening LE edema, palps, dizziness or syncope  Pt denies new neuro symptoms such as headache, facial or extremity weakness   Pt denies polydipsia, polyuria, or low sugar symptoms such as shakiness improved with eating.  Left knee much improved after recent surgury.    Preventive Screening-Counseling & Management      Drug Use:  no.    Problems Prior to Update: 1)  Peripheral Edema  (ICD-782.3) 2)  Loose Body in Knee  (ICD-717.6) 3)  Contusions, Multiple  (ICD-924.8) 4)  Shoulder Pain, Left  (ICD-719.41) 5)  Knee Pain, Left, Acute  (ICD-719.46) 6)  Preventive Health Care  (ICD-V70.0) 7)  Hypersomnia  (ICD-780.54) 8)  Ankle Pain, Left  (ICD-719.47) 9)  Sarcoidosis  (ICD-135) 10)  Preventive Health Care  (ICD-V70.0) 11)  Routine General Medical Exam@health  Care Facl  (ICD-V70.0) 12)  Enlargement of Lymph Nodes  (ICD-785.6) 13)  Colonic Polyps, Hx of  (ICD-V12.72) 14)  Hyperlipidemia  (ICD-272.4) 15)  Other Diseases of Lung Not Elsewhere Classified  (ICD-518.89) 16)  Dizziness  (ICD-780.4) 17)  Dyspnea  (ICD-786.05) 18)  Glucose Intolerance  (ICD-271.3) 19)  Morbid Obesity  (ICD-278.01) 20)  Migraines, Hx of  (ICD-V13.8) 21)  Hypertension  (ICD-401.9) 22)  Depression  (ICD-311) 23)  Anxiety  (ICD-300.00)  Medications Prior to Update: 1)  Diltiazem Hcl Coated Beads 240 Mg  Cp24 (Diltiazem Hcl Coated Beads) .... Take 1 Capsule By Mouth Once A Day 2)  Diovan 320 Mg Tabs (Valsartan) .Marland Kitchen.. 1 By Mouth Once Daily 3)  Tenormin 50 Mg Tabs (Atenolol) .... Take 1 Tablet By Mouth Every Night 4)  Wellbutrin Sr 100 Mg  Tb12 (Bupropion Hcl) .... 3 By Mouth Qd 5)  Clorazepate Dipotassium 7.5 Mg  Tabs (Clorazepate Dipotassium) .Marland Kitchen.. 1 By Mouth Qid 6)  Flexeril 10 Mg  Tabs (Cyclobenzaprine Hcl) .Marland Kitchen.. 1po Once Daily Prn 7)  Ecotrin Low Strength 81 Mg  Tbec (Aspirin) .Marland Kitchen.. 1 By Mouth Qd 8)  Cpap 5 C[facility] 9)  Darvocet-N 100 100-650 Mg Tabs (Propoxyphene N-Apap) .Marland Kitchen.. 1 By Mouth Q 6 Hrs As Needed Pain 10)  Nuvigil 250 Mg Tabs (Armodafinil) .Marland Kitchen.. 1 Daily As Need 11)  Pristiq 50 Mg Xr24h-Tab (Desvenlafaxine Succinate) .Marland Kitchen.. 1 By Mouth Once Daily 12)  Furosemide 40 Mg Tabs (Furosemide) .Marland Kitchen.. 1po Once Daily  Current Medications (verified): 1)  Diltiazem Hcl Coated Beads 240 Mg Cp24 (Diltiazem Hcl Coated Beads) .... Take 1 Capsule By Mouth Once A Day 2)  Diovan 320 Mg Tabs (Valsartan) .Marland Kitchen.. 1 By Mouth Once Daily 3)  Tenormin 50 Mg Tabs (Atenolol) .... Take 1 Tablet By Mouth Every Night 4)  Wellbutrin Sr 100 Mg  Tb12 (Bupropion Hcl) .... 3 By Mouth Qd 5)  Clorazepate Dipotassium 7.5 Mg  Tabs (Clorazepate Dipotassium) .Marland Kitchen.. 1 By Mouth Qid 6)  Flexeril 10 Mg  Tabs (Cyclobenzaprine Hcl) .Marland Kitchen.. 1po Once Daily Prn 7)  Ecotrin Low Strength 81 Mg  Tbec (Aspirin) .Marland Kitchen.. 1 By Mouth Qd 8)  Cpap 5 C[facility] 9)  Lexapro 10 Mg Tabs (Escitalopram Oxalate) .Marland Kitchen.. 1po Once Daily 10)  Furosemide 40 Mg Tabs (Furosemide) .Marland Kitchen.. 1po Once Daily  Allergies (verified): 1)  ! Sulfa 2)  ! Prozac  Past History:  Past Medical History: Last updated: 05/12/2009 Anxiety Depression Hypertension Migraine Headache OSA- NPSG 04/08/99 AHI 15/hr, CPAP 9/ AHI 3/hr.  Morbid Obesity glucose intolerance Hyperlipidemia Colonic polyps, hx of sarcoid by med. 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 Smoked Alcohol use-yes no children Drug use-no  Risk Factors: Smoking Status: never (08/01/2007)  Past Surgical History: removal uterine fibroids left arm surgery with rod s/p right knee arthroscopy lasik vision sgy mediastinoscopy and bronchoscopy 08/2007 gum surgeries s/p left knee arthroscopy dec 2010 - dr Theda Sers  Social History: financial analyst widow, 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         all otherwise negative per pt -    Physical Exam  General:  alert and overweight-appearing.   Head:  normocephalic and atraumatic.   Eyes:  vision grossly intact, pupils equal, and pupils round.   Ears:  R ear normal and L ear normal.   Nose:  no external deformity and no nasal discharge.   Mouth:  no gingival abnormalities and pharynx pink and moist.   Neck:  supple and no masses.   Lungs:  normal respiratory effort and normal breath sounds.   Heart:  normal rate and regular rhythm.   Abdomen:  soft, non-tender, and normal bowel sounds.   Msk:  no joint tenderness and no joint swelling.   Extremities:  no edema, no erythema  Neurologic:  cranial nerves II-XII intact and strength normal in all extremities.   Skin:   color normal and no rashes.   Psych:  not depressed appearing and slightly anxious.     Impression & Recommendations:  Problem # 1:  Preventive Health Care (ICD-V70.0)  Overall doing well, age appropriate education and counseling updated and referral for appropriate preventive services done unless declined, immunizations up to date or declined, diet counseling done if overweight, urged to quit smoking if smokes , most recent labs reviewed and current ordered if appropriate, ecg reviewed or declined (interpretation per ECG scanned in the EMR if done); information regarding Medicare Prevention requirements given if appropriate   Orders: TLB-BMP (Basic Metabolic Panel-BMET) (99991111) TLB-CBC Platelet - w/Differential (85025-CBCD) TLB-Hepatic/Liver Function Pnl (80076-HEPATIC) TLB-Lipid Panel (80061-LIPID) TLB-TSH (Thyroid Stimulating Hormone) (84443-TSH) TLB-Udip ONLY (81003-UDIP)  Complete Medication List: 1)  Diltiazem Hcl Coated Beads 240 Mg Cp24 (Diltiazem hcl  coated beads) .... Take 1 capsule by mouth once a day 2)  Diovan 320 Mg Tabs (Valsartan) .Marland Kitchen.. 1 by mouth once daily 3)  Tenormin 50 Mg Tabs (Atenolol) .... Take 1 tablet by mouth every night 4)  Wellbutrin Sr 100 Mg Tb12 (Bupropion hcl) .... 3 by mouth qd 5)  Clorazepate Dipotassium 7.5 Mg Tabs (Clorazepate dipotassium) .Marland Kitchen.. 1 by mouth qid 6)  Flexeril 10 Mg Tabs (Cyclobenzaprine hcl) .Marland Kitchen.. 1po once daily prn 7)  Ecotrin Low Strength 81 Mg Tbec (Aspirin) .Marland Kitchen.. 1 by mouth qd 8)  Cpap 5 C[facility]  9)  Lexapro 10 Mg Tabs (Escitalopram oxalate) .Marland Kitchen.. 1po once daily 10)  Furosemide 40 Mg Tabs (Furosemide) .Marland Kitchen.. 1po once daily  Patient Instructions: 1)  Please go to the Lab in the basement for your blood and/or urine tests today  2)  Continue all previous medications as before this visit  3)  Your medications were sent to the pharmacy ([facility]) 4)  Please schedule a follow-up appointment in 6 months or sooner or  needed Prescriptions: FUROSEMIDE 40 MG TABS (FUROSEMIDE) 1po once daily  #90 x 3   Entered and Authorized by:   Biagio Borg MD   Signed by:   Biagio Borg MD on 11/10/2009   Method used:   Electronically to        [id number]* (retail)       [place], [place]  [address]       Ph: [phone]       Fax: [phone]   RxID:   [id number] TENORMIN 50 MG TABS (ATENOLOL) Take 1 tablet by mouth every night  #90 x 3   Entered and Authorized by:   Biagio Borg MD   Signed by:   Biagio Borg MD on 11/10/2009   Method used:   Electronically to        [facility]. [id number]* (retail)       [place], [place]  [address]       Ph: [phone]       Fax: [phone]   RxID:   [id number] DIOVAN 320 MG TABS (VALSARTAN) 1 by mouth once daily  #90 x 3   Entered and Authorized by:   Biagio Borg MD   Signed by:   Biagio Borg MD on 11/10/2009   Method used:   Electronically to        [facility]. #[id number]* (retail)       [place], [place]  [address]       Ph: [phone]       Fax: [phone]   RxID:   [id number] DILTIAZEM HCL COATED BEADS 240 MG CP24 (DILTIAZEM HCL COATED BEADS) Take 1 capsule by mouth once a day  #90 x 3   Entered and Authorized by:   Biagio Borg MD   Signed by:   Biagio Borg MD on 11/10/2009   Method used:   Electronically to        [facility]. [id number]* (retail)       [place], [place]  [address]       Ph: [phone]       Fax: [phone]   RxID:   [id number]

## 2010-08-17 NOTE — Progress Notes (Signed)
  Phone Note Other Incoming   Request: Send information Summary of Call: Request for records received from [facility]. Request forwarded to [place]. [name])

## 2010-10-31 ENCOUNTER — Other Ambulatory Visit: Payer: Self-pay | Admitting: Internal Medicine

## 2010-11-19 ENCOUNTER — Other Ambulatory Visit: Payer: Self-pay | Admitting: Internal Medicine

## 2010-11-28 NOTE — Assessment & Plan Note (Signed)
OFFICE VISIT   Tara [name], Tara [name]  DOB:  [birth year]                                        October 01, 2007  CHART #:  [id number]   Tara [name] came for followup and her incisions are well healed.  She is  seeing Dr. Baird Lyons for her sarcoidosis.  Her blood pressure is  152/83, pulse 61, respirations 18.  Sats are 97%.   I will see her back again if she has any future problems.   Nicanor Alcon, M.D.  Electronically Signed   DPB/MEDQ  D:  10/01/2007  T:  10/01/2007  Job:  [id number]

## 2010-11-28 NOTE — Op Note (Signed)
Tara [name], Tara [name]               ACCOUNT NO.:  [id number]   MEDICAL RECORD NO.:  [id number]          PATIENT TYPE:  AMB   LOCATION:  SDS                          FACILITY:  [facility]   PHYSICIAN:  Nicanor Alcon, M.D. DATE OF BIRTH:  18-Mar-1951   DATE OF PROCEDURE:  09/05/2007  DATE OF DISCHARGE:                               OPERATIVE REPORT   PREOPERATIVE DIAGNOSIS:  Mediastinal adenopathy.   POSTOPERATIVE DIAGNOSIS:  Mediastinal adenopathy.   OPERATION PERFORMED:  Fiber bronchoscopy, mediastinoscopy.   After general anesthesia, the video bronchoscope was passed through the  endotracheal tube.  The carina was in the midline.  The right upper  lobe, right middle lobe, and right lower lobe orifices were normal.  The  left main stem, left upper lobe, and left lower lobe orifices were  normal.  Cultures and washings for cytology were taken.  The video  bronchoscope was removed.  The anterior neck was prepped and draped in  the usual sterile manner.  A transverse incision was made and carried  down with electrocautery through subcutaneous tissue and fascia.  The  pretracheal fascia was entered.  Biopsies of a 2R and a 4R node were  done and they revealed sarcoidosis.  The strap muscles were closed with  2-0 Vicryl, the subcutaneous tissue with 3-0 Vicryl, and Dermabond for  the skin.  The patient returned to the recovery room in stable  condition.      Nicanor Alcon, M.D.  Electronically Signed     DPB/MEDQ  D:  09/05/2007  T:  09/05/2007  Job:  [id number]   cc:   Biagio Borg, MD

## 2010-11-28 NOTE — Letter (Signed)
September 09, 2007   Biagio Borg, MD  [address], [place] [address]   Re:  Tara [name], Tara [name]                 DOB:  [birth year]   Dear Dr. [name]:   I saw Tara [name] back today after her bronchoscopy.  Her  mediastinoscopy site was healing well.  Her blood pressure was 147/85.  Pulse 63.  Respirations 18.  Saturations were 96%.  Her pathology report  revealed granulomatous lymphadenitis consistent with sarcoid, and I  explained this to her, and told her to check with you to see if you  would require any treatment with steroids.  Given that adenopathy, this  probably just a stage I sarcoidosis.  I would think that observation  would be the way to go, but I will let you decide that.  I will see her  back in 3 weeks and check on her wound.   Nicanor Alcon, M.D.  Electronically Signed   DPB/MEDQ  D:  09/09/2007  T:  09/10/2007  Job:  [id number]

## 2010-11-28 NOTE — Procedures (Signed)
NAME:  Tara [name], Tara [name]               ACCOUNT NO.:  [id number]   MEDICAL RECORD NO.:  [id number]          PATIENT TYPE:  OUT   LOCATION:  SLEEP CENTER                 FACILITY:  [facility]   PHYSICIAN:  Clinton D. Annamaria Boots, MD, FCCP, FACPDATE OF BIRTH:  1950-10-04   DATE OF STUDY:  06/25/2008                            NOCTURNAL POLYSOMNOGRAM   REFERRING PHYSICIAN:   DATE OF STUDY:  June 25, 2008.   INDICATION FOR STUDY:  Hypersomnia with sleep apnea.   EPWORTH SLEEPINESS SCORE:  Epworth sleepiness score 17/24.  BMI 39.5.  Weight 245 pounds.  Height 66 inches.  Neck 17 inches.   MEDICATIONS:  Home medications charted and reviewed.   SLEEP ARCHITECTURE:  Total sleep time 338 minutes with sleep efficiency  81.9%.  Stage I is 15.5%.  Stage II 84.5%.  Stage III and REM were  absent.  Sleep latency 44 minutes.  Awake after sleep onset 31.5  minutes.  Arousal index 19.3.  Bedtime medication included atenolol,  diltiazem, and clorazepate.   RESPIRATORY DATA:  Apnea-hypopnea index (AHI) 1.4 per hour, respiratory  disturbance index (RDI) 2.5 per hour.  A total of 8 events were scored  including 1 obstructive apnea and 7 hypopneas.  Events were most common  while supine.  There were insufficient events to permit CPAP titration  by split study protocol on the study night.   OXYGEN DATA:  Moderately loud snoring with oxygen desaturation to a  nadir of 89% with mean oxygen saturation through the study was 94.4% on  room air.   CARDIAC DATA:  Normal sinus rhythm.   MOVEMENT/PARASOMNIA:  No significant movement disturbance.  No bathroom  trips.   IMPRESSION/RECOMMENDATIONS:  1. Sleep architecture after clorazepate was significant for absent      rapid eye movements and slow wave sleep.  2. Occasional respiratory event affecting sleep, apnea-hypopnea index      1.4 per hour (normal range 0-5 per hour).      The events were more common while supine.  Moderately loud snoring      without oxygen  desaturation to a nadir of 89%.      Clinton D. Annamaria Boots, MD, [facility], FACP  Diplomate, Tax adviser of Sleep Medicine  Electronically Signed     CDY/MEDQ  D:  07/03/2008 10:56:23  T:  07/03/2008 23:01:51  Job:  [id number]

## 2010-11-28 NOTE — Letter (Signed)
August 19, 2007   Biagio Borg, MD  [address], [place] [address]   Re:  Tara [name], Tara [name]                 DOB:  22-Sep-1950   Dear Dr. [name]:   I appreciate the opportunity to examine this patient.  She is a 60-year-  old Caucasian female who was found to have mediastinal adenopathy.  She  had a chest x-ray and subsequent CT that showed increased thoracic  inlet, mediastinal and hilar, and upper abdominal adenopathy, which is  worrisome for possible sarcoidosis or lymphoma.  Her 2D echo showed  ejection fraction of 65%.  No effusion.  No hemoptysis, fevers or  chills.  No weight loss.   PAST MEDICAL HISTORY:  Medications include Diltiazem 240 mg a day,  Diovan hydrochlorothiazide 300 mg daily.  Tenormin 50 mg daily.  Lexapro  20 mg daily.  Wellbutrin SR 100 mg.  Keppra 500 mg 3 times a day.  Clorazepate 7.5 mg 4 times a day.  Flexeril 10 mg a day.  Ecotrin.   She has been treated for anxiety, depression, migraine headaches,  glucose intolerance, hyperlipidemia, colon polyps, and osteoarthritis.  Previous surgeries include left arm surgery, right knee arthroscopy, and  removal of fibroids.   FAMILY HISTORY:  Not positive for cardiac disease.   SOCIAL HISTORY:  She is widowed.  Has no children.  Works as Sports administrator.  Does not smoke or drink.  She is allergic to Prozac, which  causes mental reaction, and sulfa, which causes rash.   REVIEW OF SYSTEMS:  She is 235 pounds, 5 feet 6 inches.  CARDIAC:  She gets short of breath with exertion.  No angina or atrial  fibrillation.  PULMONARY:  She has had no hemoptysis, fevers or chills.  GASTROINTESTINAL:  No nausea or vomiting.  No constipation or diarrhea.  GENITOURINARY:  No kidney disease, dysuria, or frequent urination.  VASCULAR:  No claudication, DVT, TIA.  NEUROLOGIC:  She has migraine headaches.  MUSCULOSKELETAL:  Osteoarthritis.  PSYCH:  Depression and nervousness.  HEENT:  No change in her eyesight or  hearing.  HEMATOLOGICAL:  NO problems with bleeding or clotting disorders.   PHYSICAL EXAM:  She is an obese Caucasian female in no acute distress.  Blood pressure 133/88, pulse 62, respirations 18, sats 98%.  Head, eyes,  ears, nose, and throat unremarkable.  Neck supple.  No thyromegaly.  Pulses good.  No clavicular adenopathy.  Chest is clear to auscultation  and percussion.  Heart is normal sinus rhythm.  No murmurs.  Abdomen is  soft with no hepatosplenomegaly.  Pulses 2+.  There is no clubbing or  edema.  Neurological, she is alert and oriented x3.  Sensory and motor  intact.  Cranial nerves intact.   I feel that she probably has either sarcoidosis or lymphoma.  I  discussed the options with her.  We plan to do bronchoscopy and  mediastinoscopy on her on the 20th.  I will let you know the operative  findings.   Nicanor Alcon, M.D.  Electronically Signed   DPB/MEDQ  D:  08/19/2007  T:  08/20/2007  Job:  [id number]

## 2011-03-09 DIAGNOSIS — R0602 Shortness of breath: Secondary | ICD-10-CM

## 2011-03-21 ENCOUNTER — Ambulatory Visit: Payer: Managed Care, Other (non HMO)

## 2011-03-21 DIAGNOSIS — Z0000 Encounter for general adult medical examination without abnormal findings: Secondary | ICD-10-CM

## 2011-03-21 LAB — TSH: TSH: 2.14 u[IU]/mL (ref 0.35–5.50)

## 2011-03-21 LAB — BASIC METABOLIC PANEL
BUN: 20 mg/dL (ref 6–23)
CO2: 30 mEq/L (ref 19–32)
Calcium: 9.7 mg/dL (ref 8.4–10.5)
Chloride: 103 mEq/L (ref 96–112)
Creatinine, Ser: 1 mg/dL (ref 0.4–1.2)

## 2011-03-21 LAB — HEPATIC FUNCTION PANEL
ALT: 20 U/L (ref 0–35)
Total Bilirubin: 0.7 mg/dL (ref 0.3–1.2)

## 2011-03-21 LAB — CBC WITH DIFFERENTIAL/PLATELET
Basophils Absolute: 0 10*3/uL (ref 0.0–0.1)
Eosinophils Relative: 3.3 % (ref 0.0–5.0)
HCT: 43.7 % (ref 36.0–46.0)
Lymphocytes Relative: 19.3 % (ref 12.0–46.0)
Lymphs Abs: 1.1 10*3/uL (ref 0.7–4.0)
Monocytes Relative: 9.6 % (ref 3.0–12.0)
Platelets: 206 10*3/uL (ref 150.0–400.0)
WBC: 5.8 10*3/uL (ref 4.5–10.5)

## 2011-03-21 LAB — URINALYSIS
Hgb urine dipstick: NEGATIVE
Leukocytes, UA: NEGATIVE
Specific Gravity, Urine: 1.015 (ref 1.000–1.030)
Urine Glucose: NEGATIVE
Urobilinogen, UA: 0.2 (ref 0.0–1.0)

## 2011-03-21 LAB — LIPID PANEL
Cholesterol: 177 mg/dL (ref 0–200)
HDL: 47.4 mg/dL (ref >39.00)
LDL Cholesterol: 91 mg/dL (ref 0–99)
Triglycerides: 192 mg/dL — ABNORMAL HIGH (ref 0.0–149.0)

## 2011-03-23 ENCOUNTER — Encounter: Payer: Self-pay | Admitting: Internal Medicine

## 2011-03-23 DIAGNOSIS — E099 Drug or chemical induced diabetes mellitus without complications: Secondary | ICD-10-CM | POA: Insufficient documentation

## 2011-03-23 DIAGNOSIS — T380X5A Adverse effect of glucocorticoids and synthetic analogues, initial encounter: Secondary | ICD-10-CM

## 2011-03-23 DIAGNOSIS — Z0001 Encounter for general adult medical examination with abnormal findings: Secondary | ICD-10-CM | POA: Insufficient documentation

## 2011-03-23 DIAGNOSIS — R7302 Impaired glucose tolerance (oral): Secondary | ICD-10-CM

## 2011-03-23 HISTORY — DX: Impaired glucose tolerance (oral): R73.02

## 2011-03-26 ENCOUNTER — Ambulatory Visit (INDEPENDENT_AMBULATORY_CARE_PROVIDER_SITE_OTHER): Payer: Managed Care, Other (non HMO) | Admitting: Internal Medicine

## 2011-03-26 ENCOUNTER — Encounter: Payer: Self-pay | Admitting: Internal Medicine

## 2011-03-26 VITALS — BP 112/80 | HR 48 | Temp 97.8°F | Ht 66.0 in | Wt 226.5 lb

## 2011-03-26 DIAGNOSIS — Z0000 Encounter for general adult medical examination without abnormal findings: Secondary | ICD-10-CM

## 2011-03-26 DIAGNOSIS — Z23 Encounter for immunization: Secondary | ICD-10-CM

## 2011-03-26 DIAGNOSIS — R7302 Impaired glucose tolerance (oral): Secondary | ICD-10-CM

## 2011-03-26 DIAGNOSIS — R7309 Other abnormal glucose: Secondary | ICD-10-CM

## 2011-03-26 MED ORDER — ESCITALOPRAM OXALATE 20 MG PO TABS: 20.0000 mg | ORAL_TABLET | Freq: Every day | ORAL | Status: DC

## 2011-03-26 NOTE — Patient Instructions (Addendum)
Please remember to followup with your GYN for the yearly pap smear and/or mammogram You are otherwise up to date with prevention today Please continue to exercise as best as you can, continue to lose wt, and continue your low cholesterol diet Please return in 1 year for your yearly visit, or sooner if needed, with Lab testing done 3-5 days before

## 2011-03-26 NOTE — Assessment & Plan Note (Signed)

## 2011-03-26 NOTE — Progress Notes (Signed)
Subjective:    Patient ID: Tara [name], female    DOB: 09-09-1950, 60 y.o.   MRN: [id number]  HPI  Here for wellness and f/u;  Overall doing ok;  Pt denies CP, worsening SOB, DOE, wheezing, orthopnea, PND, worsening LE edema, palpitations, dizziness or syncope.  Pt denies neurological change such as new Headache, facial or extremity weakness.  Pt denies polydipsia, polyuria, or low sugar symptoms. Pt states overall good compliance with treatment and medications, good tolerability, and trying to follow lower cholesterol diet.  Pt denies worsening depressive symptoms, suicidal ideation or panic. No fever, wt loss, night sweats, loss of appetite, or other constitutional symptoms.  Pt states good ability with ADL's, low fall risk, home safety reviewed and adequate, no significant changes in hearing or vision, and occasionally active with exercise.  Still has some left knee pain after hitting it on gym equipment.  Did have wellbutrin stopped due to ongoing fear and anxiety - lexparo now at 20 mg (though she was incr to 30 mg per psychiatry)  Past Medical History  Diagnosis Date  . ANKLE PAIN, LEFT 04/01/2008  . ANXIETY 04/17/2007  . COLONIC POLYPS, HX OF 08/01/2007  . CONTUSIONS, MULTIPLE 04/01/2009  . DEPRESSION 04/17/2007  . DIZZINESS 08/01/2007  . DYSPNEA 08/01/2007  . Enlargement of lymph nodes 08/13/2007  . GLUCOSE INTOLERANCE 08/01/2007  . HYPERLIPIDEMIA 08/01/2007  . HYPERSOMNIA 07/28/2008  . HYPERTENSION 04/17/2007  . JOINT EFFUSION, LEFT KNEE 06/02/2010  . Loose body in knee 04/01/2009  . MIGRAINES, HX OF 04/17/2007  . Morbid obesity 04/20/2007  . OTHER DISEASES OF LUNG NOT ELSEWHERE CLASSIFIED 08/01/2007  . Pain in joint, lower leg 04/01/2009  . PERIPHERAL EDEMA 04/21/2009  . Sarcoidosis 09/25/2007  . SHOULDER PAIN, LEFT 04/01/2009  . Impaired glucose tolerance 03/23/2011   Past Surgical History  Procedure Date  . Removal uterine fibroids   . Left arm surgury with rod   . S/p right knee  arthroscopy   . Lasik vision surg   . Combined mediastinoscopy and bronchoscopy 08/2007  . Gum surgeries   . S/p left knee arthroscopy dec. 2010    Dr. Theda Sers    reports that she has never smoked. She does not have any smokeless tobacco history on file. She reports that she drinks alcohol. She reports that she does not use illicit drugs. family history includes Heart disease (age of onset:78) in her father. Allergies  Allergen Reactions  . Fluoxetine Hcl   . Sulfonamide Derivatives    Current Outpatient Prescriptions on File Prior to Visit  Medication Sig Dispense Refill  . ARIPiprazole (ABILIFY) 5 MG tablet 1/2 by mouth once daily       . aspirin 81 MG tablet Take 81 mg by mouth daily.        Marland Kitchen atenolol (TENORMIN) 50 MG tablet TAKE 1 TABLET BY MOUTH EVERY NIGHT AT BEDTIME  90 tablet  1  . CARTIA XT 240 MG 24 hr capsule TAKE ONE CAPSULE BY MOUTH DAILY  90 capsule  1  . clorazepate (TRANXENE) 7.5 MG tablet Take 7.5 mg by mouth QID.        Marland Kitchen DIOVAN 320 MG tablet TAKE 1 TABLET BY MOUTH DAILY  90 tablet  2  . escitalopram (LEXAPRO) 10 MG tablet Take 10 mg by mouth daily.        . furosemide (LASIX) 40 MG tablet TAKE 1 TABLET BY MOUTH DAILY  90 tablet  1  . traMADol (ULTRAM) 50 MG tablet Take 50  mg by mouth daily.        Marland Kitchen buPROPion (WELLBUTRIN SR) 100 MG 12 hr tablet Take 100 mg by mouth 2 (two) times daily.        . cyclobenzaprine (FLEXERIL) 10 MG tablet Take 10 mg by mouth daily as needed.        . NON FORMULARY CPAP 5 CWP [employer]        Review of Systems Review of Systems  Constitutional: Negative for diaphoresis, activity change, appetite change and unexpected weight change.  HENT: Negative for hearing loss, ear pain, facial swelling, mouth sores and neck stiffness.   Eyes: Negative for pain, redness and visual disturbance.  Respiratory: Negative for shortness of breath and wheezing.   Cardiovascular: Negative for chest pain and palpitations.  Gastrointestinal: Negative for  diarrhea, blood in stool, abdominal distention and rectal pain.  Genitourinary: Negative for hematuria, flank pain and decreased urine volume.  Musculoskeletal: Negative for myalgias and joint swelling.  Skin: Negative for color change and wound.  Neurological: Negative for syncope and numbness.  Hematological: Negative for adenopathy.  Psychiatric/Behavioral: Negative for hallucinations, self-injury, decreased concentration and agitation.      Objective:   Physical Exam BP 112/80  Pulse 48  Temp(Src) 97.8 F (36.6 C) (Oral)  Ht 5\' 6"  (1.676 m)  Wt 226 lb 8 oz (102.74 kg)  BMI 36.56 kg/m2  SpO2 95% Physical Exam  VS noted Constitutional: Pt is oriented to person, place, and time. Appears well-developed and well-nourished.  HENT:  Head: Normocephalic and atraumatic.  Right Ear: External ear normal.  Left Ear: External ear normal.  Nose: Nose normal.  Mouth/Throat: Oropharynx is clear and moist.  Eyes: Conjunctivae and EOM are normal. Pupils are equal, round, and reactive to light.  Neck: Normal range of motion. Neck supple. No JVD present. No tracheal deviation present.  Cardiovascular: Normal rate, regular rhythm, normal heart sounds and intact distal pulses.   Pulmonary/Chest: Effort normal and breath sounds normal.  Abdominal: Soft. Bowel sounds are normal. There is no tenderness.  Musculoskeletal: Normal range of motion. Exhibits no edema.  Lymphadenopathy:  Has no cervical adenopathy.  Neurological: Pt is alert and oriented to person, place, and time. Pt has normal reflexes. No cranial nerve deficit.  Skin: Skin is warm and dry. No rash noted.  Psychiatric:  Has  normal mood and affect. Behavior is normal. 1+ nervous       Assessment & Plan:

## 2011-04-06 LAB — COMPREHENSIVE METABOLIC PANEL
Albumin: 3.9
Alkaline Phosphatase: 123 — ABNORMAL HIGH
BUN: 10
CO2: 32
Chloride: 92 — ABNORMAL LOW
Glucose, Bld: 94
Potassium: 3.8
Total Bilirubin: 0.6

## 2011-04-06 LAB — TYPE AND SCREEN

## 2011-04-06 LAB — AFB CULTURE WITH SMEAR (NOT AT ARMC): Acid Fast Smear: NONE SEEN

## 2011-04-06 LAB — FUNGUS CULTURE W SMEAR

## 2011-04-06 LAB — CULTURE, RESPIRATORY W GRAM STAIN: Gram Stain: NONE SEEN

## 2011-04-06 LAB — CBC
HCT: 38.6
Hemoglobin: 13.3
Platelets: 262
RBC: 4.55
WBC: 4.9

## 2011-04-06 LAB — PROTIME-INR: INR: 0.9

## 2011-04-23 ENCOUNTER — Other Ambulatory Visit: Payer: Self-pay | Admitting: Internal Medicine

## 2011-05-21 ENCOUNTER — Other Ambulatory Visit: Payer: Self-pay | Admitting: Internal Medicine

## 2011-07-20 ENCOUNTER — Other Ambulatory Visit: Payer: Self-pay | Admitting: Obstetrics and Gynecology

## 2011-07-20 DIAGNOSIS — Z1231 Encounter for screening mammogram for malignant neoplasm of breast: Secondary | ICD-10-CM

## 2011-08-03 ENCOUNTER — Other Ambulatory Visit: Payer: Self-pay | Admitting: Internal Medicine

## 2011-08-09 ENCOUNTER — Ambulatory Visit
Admission: RE | Admit: 2011-08-09 | Discharge: 2011-08-09 | Disposition: A | Discharge status: Home / Self Care | Payer: Commercial Indemnity | Source: Ambulatory Visit | Attending: Obstetrics and Gynecology | Admitting: Obstetrics and Gynecology

## 2011-08-09 DIAGNOSIS — Z1231 Encounter for screening mammogram for malignant neoplasm of breast: Secondary | ICD-10-CM

## 2011-08-13 ENCOUNTER — Encounter: Payer: Self-pay | Admitting: Gynecology

## 2011-08-13 DIAGNOSIS — D219 Benign neoplasm of connective and other soft tissue, unspecified: Secondary | ICD-10-CM | POA: Insufficient documentation

## 2011-08-13 DIAGNOSIS — N915 Oligomenorrhea, unspecified: Secondary | ICD-10-CM | POA: Insufficient documentation

## 2011-08-17 ENCOUNTER — Other Ambulatory Visit: Payer: Self-pay | Admitting: *Deleted

## 2011-08-17 DIAGNOSIS — N63 Unspecified lump in unspecified breast: Secondary | ICD-10-CM

## 2011-08-28 ENCOUNTER — Other Ambulatory Visit: Payer: Self-pay | Admitting: Obstetrics and Gynecology

## 2011-08-28 ENCOUNTER — Ambulatory Visit
Admission: RE | Admit: 2011-08-28 | Discharge: 2011-08-28 | Disposition: A | Discharge status: Home / Self Care | Payer: Commercial Indemnity | Source: Ambulatory Visit | Attending: Obstetrics and Gynecology | Admitting: Obstetrics and Gynecology

## 2011-08-28 DIAGNOSIS — N63 Unspecified lump in unspecified breast: Secondary | ICD-10-CM

## 2011-09-03 ENCOUNTER — Encounter: Payer: Self-pay | Admitting: Obstetrics and Gynecology

## 2011-09-03 ENCOUNTER — Ambulatory Visit (INDEPENDENT_AMBULATORY_CARE_PROVIDER_SITE_OTHER): Payer: Managed Care, Other (non HMO) | Admitting: Obstetrics and Gynecology

## 2011-09-03 ENCOUNTER — Other Ambulatory Visit (HOSPITAL_COMMUNITY)
Admission: RE | Admit: 2011-09-03 | Discharge: 2011-09-03 | Disposition: A | Discharge status: Home / Self Care | Payer: Managed Care, Other (non HMO) | Source: Ambulatory Visit | Attending: Obstetrics and Gynecology | Admitting: Obstetrics and Gynecology

## 2011-09-03 VITALS — BP 124/74 | Ht 66.0 in | Wt 226.0 lb

## 2011-09-03 DIAGNOSIS — Z01419 Encounter for gynecological examination (general) (routine) without abnormal findings: Secondary | ICD-10-CM

## 2011-09-03 LAB — URINALYSIS W MICROSCOPIC + REFLEX CULTURE
Bilirubin Urine: NEGATIVE
Glucose, UA: NEGATIVE mg/dL
Ketones, ur: NEGATIVE mg/dL
Protein, ur: NEGATIVE mg/dL

## 2011-09-03 NOTE — Progress Notes (Signed)
The patient came back to see me today for annual GYN exam. She is having no menstrual cycle. She is having no vaginal bleeding. She is having no pelvic pain. She just had an abnormal mammogram. Tomorrow she is having bilateral breast biopsies. She does her lab through her PCP. She has had 2 normal bone densities.  Physical examination: [name] present. HEENT within normal limits. Neck: Thyroid not large. No masses. Supraclavicular nodes: not enlarged. Breasts: Examined in both sitting midline position. No skin changes and no masses. Abdomen: Soft no guarding rebound or masses or hernia. Pelvic: External: Within normal limits. BUS: Within normal limits. Vaginal:within normal limits. Good estrogen effect. No evidence of cystocele rectocele or enterocele. Cervix: clean. Uterus: Normal size and shape. Adnexa: No masses. Rectovaginal exam: Confirmatory and negative. Extremities: Within normal limits.  Assessment: Normal GYN exam. Abnormal mammogram.  Plan: Bilateral breast biopsies scheduled.

## 2011-09-04 ENCOUNTER — Other Ambulatory Visit: Payer: Self-pay

## 2011-09-04 ENCOUNTER — Other Ambulatory Visit: Payer: Self-pay | Admitting: Obstetrics and Gynecology

## 2011-09-04 ENCOUNTER — Ambulatory Visit
Admission: RE | Admit: 2011-09-04 | Discharge: 2011-09-04 | Disposition: A | Discharge status: Home / Self Care | Payer: Managed Care, Other (non HMO) | Source: Ambulatory Visit | Attending: Obstetrics and Gynecology | Admitting: Obstetrics and Gynecology

## 2011-09-04 DIAGNOSIS — N63 Unspecified lump in unspecified breast: Secondary | ICD-10-CM

## 2011-09-04 DIAGNOSIS — R3129 Other microscopic hematuria: Secondary | ICD-10-CM

## 2011-09-04 LAB — URINE CULTURE
Colony Count: NO GROWTH
Organism ID, Bacteria: NO GROWTH

## 2011-10-03 ENCOUNTER — Other Ambulatory Visit: Payer: Managed Care, Other (non HMO)

## 2011-10-03 DIAGNOSIS — R3129 Other microscopic hematuria: Secondary | ICD-10-CM

## 2011-10-04 LAB — URINALYSIS W MICROSCOPIC + REFLEX CULTURE
Bacteria, UA: NONE SEEN
Hgb urine dipstick: NEGATIVE
Ketones, ur: NEGATIVE mg/dL
Nitrite: NEGATIVE
Urobilinogen, UA: 0.2 mg/dL (ref 0.0–1.0)

## 2012-03-04 ENCOUNTER — Other Ambulatory Visit: Payer: Self-pay

## 2012-03-04 MED ORDER — VALSARTAN 320 MG PO TABS: 320.0000 mg | ORAL_TABLET | Freq: Every day | ORAL | Status: DC

## 2012-03-04 MED ORDER — ATENOLOL 50 MG PO TABS: 50.0000 mg | ORAL_TABLET | Freq: Every day | ORAL | Status: DC

## 2012-03-05 ENCOUNTER — Other Ambulatory Visit: Payer: Self-pay

## 2012-03-05 MED ORDER — FUROSEMIDE 40 MG PO TABS: 40.0000 mg | ORAL_TABLET | Freq: Every day | ORAL | Status: DC

## 2012-03-05 MED ORDER — DILTIAZEM HCL ER COATED BEADS 240 MG PO CP24: 240.0000 mg | ORAL_CAPSULE | Freq: Every day | ORAL | Status: DC

## 2012-03-05 NOTE — Telephone Encounter (Signed)
Called the patient to inform (left detailed message) refilled her prescriptions today but does need to schedule office visit as will be one year in September since being seen by PCP.

## 2012-04-04 ENCOUNTER — Other Ambulatory Visit: Payer: Self-pay | Admitting: Internal Medicine

## 2012-05-21 ENCOUNTER — Encounter: Payer: Self-pay | Admitting: Internal Medicine

## 2012-05-21 ENCOUNTER — Ambulatory Visit (INDEPENDENT_AMBULATORY_CARE_PROVIDER_SITE_OTHER): Payer: Managed Care, Other (non HMO) | Admitting: Internal Medicine

## 2012-05-21 VITALS — BP 160/80 | HR 67 | Temp 97.0°F | Ht 66.0 in | Wt 230.0 lb

## 2012-05-21 DIAGNOSIS — I10 Essential (primary) hypertension: Secondary | ICD-10-CM

## 2012-05-21 DIAGNOSIS — R7309 Other abnormal glucose: Secondary | ICD-10-CM

## 2012-05-21 DIAGNOSIS — E785 Hyperlipidemia, unspecified: Secondary | ICD-10-CM

## 2012-05-21 DIAGNOSIS — R7302 Impaired glucose tolerance (oral): Secondary | ICD-10-CM

## 2012-05-21 DIAGNOSIS — Z0000 Encounter for general adult medical examination without abnormal findings: Secondary | ICD-10-CM

## 2012-05-21 NOTE — Patient Instructions (Addendum)
Please check with your insurance to see if the shingles shot is covered Please check your blood pressures at home weekly and bring with you to your next visit Continue all other medications as before Please have the pharmacy call with any refills you may need. Please return in Jul 23, 2012 for your yearly visit, or sooner if needed, with Lab testing done 3-5 days before

## 2012-05-21 NOTE — Progress Notes (Signed)
Subjective:    Patient ID: Tara [name], female    DOB: [birth year], 61 y.o.   MRN: [id number]  HPI  Here to f/u; overall doing ok,  Pt denies chest pain, increased sob or doe, wheezing, orthopnea, PND, increased LE swelling, palpitations, dizziness or syncope.  Pt denies new neurological symptoms such as new headache, or facial or extremity weakness or numbness   Pt denies polydipsia, polyuria, or low sugar symptoms such as weakness or confusion improved with po intake.  Pt states overall good compliance with meds, trying to follow lower cholesterol, diabetic diet, wt overall stable but little exercise however.   Pt denies fever, wt loss, night sweats, loss of appetite, or other constitutional symptoms  Denies worsening depressive symptoms, suicidal ideation, or panic, though has ongoing anxiety, not increased recently.  Past Medical History  Diagnosis Date  . ANKLE PAIN, LEFT 04/01/2008  . ANXIETY 04/17/2007  . COLONIC POLYPS, HX OF 08/01/2007  . CONTUSIONS, MULTIPLE 04/01/2009  . DEPRESSION 04/17/2007  . DIZZINESS 08/01/2007  . DYSPNEA 08/01/2007  . Enlargement of lymph nodes 08/13/2007  . GLUCOSE INTOLERANCE 08/01/2007  . HYPERLIPIDEMIA 08/01/2007  . HYPERSOMNIA 07/28/2008  . HYPERTENSION 04/17/2007  . JOINT EFFUSION, LEFT KNEE 06/02/2010  . Loose body in knee 04/01/2009  . MIGRAINES, HX OF 04/17/2007  . Morbid obesity 04/20/2007  . OTHER DISEASES OF LUNG NOT ELSEWHERE CLASSIFIED 08/01/2007  . Pain in joint, lower leg 04/01/2009  . PERIPHERAL EDEMA 04/21/2009  . Sarcoidosis 09/25/2007  . SHOULDER PAIN, LEFT 04/01/2009  . Impaired glucose tolerance 03/23/2011  . Fibroid   . Oligomenorrhea    Past Surgical History  Procedure Date  . Removal uterine fibroids   . Left arm surgury with rod   . S/p right knee arthroscopy   . Lasik vision surg   . Combined mediastinoscopy and bronchoscopy 08/2007  . Gum surgeries   . S/p left knee arthroscopy dec. 2010    Dr. Theda Sers  . Lymph node biopsy     reports that she has never smoked. She does not have any smokeless tobacco history on file. She reports that she drinks alcohol. She reports that she does not use illicit drugs. family history includes Diabetes in her mother; Heart disease (age of onset:78) in her father; Hypertension in her father, mother, and sister; and Stroke in her mother. Allergies  Allergen Reactions  . Floxin (Ocuflox)   . Fluoxetine Hcl   . Sulfonamide Derivatives    Current Outpatient Prescriptions on File Prior to Visit  Medication Sig Dispense Refill  . amphetamine-dextroamphetamine (ADDERALL, 20MG ,) 20 MG tablet Take 20 mg by mouth 2 (two) times daily.      Marland Kitchen aspirin 81 MG tablet Take 81 mg by mouth daily.        Marland Kitchen atenolol (TENORMIN) 50 MG tablet Take 1 tablet (50 mg total) by mouth daily.  90 tablet  0  . Calcium Carbonate-Vitamin D (CALCIUM + D PO) Take by mouth.      . clorazepate (TRANXENE) 7.5 MG tablet Take 7.5 mg by mouth QID.        Marland Kitchen desvenlafaxine (PRISTIQ) 50 MG 24 hr tablet Take 50 mg by mouth daily.      Marland Kitchen diltiazem (CARTIA XT) 240 MG 24 hr capsule Take 1 capsule (240 mg total) by mouth daily.  90 capsule  0  . furosemide (LASIX) 40 MG tablet Take 1 tablet (40 mg total) by mouth daily.  90 tablet  0  . glucosamine-chondroitin 500-400 MG  tablet Take 1 tablet by mouth 3 (three) times daily.      . Multiple Vitamin (MULTIVITAMIN) tablet Take 1 tablet by mouth daily.      . valsartan (DIOVAN) 320 MG tablet Take 1 tablet (320 mg total) by mouth daily.  90 tablet  0  . [DISCONTINUED] CARTIA XT 240 MG 24 hr capsule TAKE ONE CAPSULE BY MOUTH DAILY  90 each  0  . escitalopram (LEXAPRO) 20 MG tablet Take 1 tablet (20 mg total) by mouth daily.  30 tablet  11   Review of Systems  Constitutional: Negative for diaphoresis and unexpected weight change.  HENT: Negative for tinnitus.   Eyes: Negative for photophobia and visual disturbance.  Respiratory: Negative for choking and stridor.   Gastrointestinal:  Negative for vomiting and blood in stool.  Genitourinary: Negative for hematuria and decreased urine volume.  Musculoskeletal: Negative for gait problem.  Skin: Negative for color change and wound.  Neurological: Negative for tremors and numbness.  Psychiatric/Behavioral: Negative for decreased concentration. The patient is not hyperactive.       Objective:   Physical Exam BP 160/80  Pulse 67  Temp 97 F (36.1 C) (Oral)  Ht 5\' 6"  (1.676 m)  Wt 230 lb (104.327 kg)  BMI 37.12 kg/m2  SpO2 96% Physical Exam  VS noted Constitutional: Pt appears well-developed and well-nourished.  HENT: Head: Normocephalic.  Right Ear: External ear normal.  Left Ear: External ear normal.  Eyes: Conjunctivae and EOM are normal. Pupils are equal, round, and reactive to light.  Neck: Normal range of motion. Neck supple.  Cardiovascular: Normal rate and regular rhythm.   Pulmonary/Chest: Effort normal and breath sounds normal.  Neurological: Pt is alert. Not confused  Skin: Skin is warm. No erythema.  Psychiatric: Pt behavior is normal. Thought content normal.     Assessment & Plan:

## 2012-05-21 NOTE — Assessment & Plan Note (Addendum)
Elevated today, d/w pt , declines change in tx at this time, o/w stable overall by hx and exam, most recent data reviewed with pt, and pt to continue medical treatment as before, f/u BP at home and drug store, f/u next visit BP Readings from Last 3 Encounters:  05/21/12 160/80  09/03/11 124/74  03/26/11 112/80

## 2012-05-21 NOTE — Assessment & Plan Note (Signed)
stable overall by hx and exam, most recent data reviewed with pt, and pt to continue medical treatment as before Lab Results  Component Value Date   HGBA1C 6.0 04/21/2009

## 2012-05-21 NOTE — Assessment & Plan Note (Signed)
stable overall by hx and exam, most recent data reviewed with pt, and pt to continue medical treatment as before Lab Results  Component Value Date   LDLCALC 91 03/21/2011

## 2012-05-22 ENCOUNTER — Other Ambulatory Visit: Payer: Self-pay

## 2012-05-22 MED ORDER — FUROSEMIDE 40 MG PO TABS: 40.0000 mg | ORAL_TABLET | Freq: Every day | ORAL | Status: DC

## 2012-05-23 ENCOUNTER — Other Ambulatory Visit: Payer: Self-pay

## 2012-05-23 MED ORDER — ATENOLOL 50 MG PO TABS: 50.0000 mg | ORAL_TABLET | Freq: Every day | ORAL | Status: DC

## 2012-05-23 MED ORDER — VALSARTAN 320 MG PO TABS: 320.0000 mg | ORAL_TABLET | Freq: Every day | ORAL | Status: DC

## 2012-05-23 MED ORDER — DILTIAZEM HCL ER COATED BEADS 240 MG PO CP24: 240.0000 mg | ORAL_CAPSULE | Freq: Every day | ORAL | Status: DC

## 2012-07-16 DIAGNOSIS — D869 Sarcoidosis, unspecified: Secondary | ICD-10-CM

## 2012-07-16 HISTORY — DX: Sarcoidosis, unspecified: D86.9

## 2012-07-16 HISTORY — DX: Hypercalcemia: E83.52

## 2012-07-22 ENCOUNTER — Encounter (HOSPITAL_COMMUNITY): Payer: Self-pay | Admitting: *Deleted

## 2012-07-22 ENCOUNTER — Inpatient Hospital Stay (HOSPITAL_COMMUNITY)
Admission: EM | Admit: 2012-07-22 | Discharge: 2012-07-25 | DRG: 197 | Disposition: A | Discharge status: Home / Self Care | Payer: Managed Care, Other (non HMO) | Attending: Internal Medicine | Admitting: Internal Medicine

## 2012-07-22 ENCOUNTER — Telehealth: Payer: Self-pay | Admitting: Internal Medicine

## 2012-07-22 ENCOUNTER — Telehealth: Payer: Self-pay

## 2012-07-22 ENCOUNTER — Emergency Department (HOSPITAL_COMMUNITY): Payer: Managed Care, Other (non HMO)

## 2012-07-22 ENCOUNTER — Other Ambulatory Visit (INDEPENDENT_AMBULATORY_CARE_PROVIDER_SITE_OTHER): Payer: Managed Care, Other (non HMO)

## 2012-07-22 DIAGNOSIS — N19 Unspecified kidney failure: Secondary | ICD-10-CM

## 2012-07-22 DIAGNOSIS — Z882 Allergy status to sulfonamides status: Secondary | ICD-10-CM

## 2012-07-22 DIAGNOSIS — E785 Hyperlipidemia, unspecified: Secondary | ICD-10-CM | POA: Diagnosis present

## 2012-07-22 DIAGNOSIS — Z860100 Personal history of colon polyps, unspecified: Secondary | ICD-10-CM

## 2012-07-22 DIAGNOSIS — Z7982 Long term (current) use of aspirin: Secondary | ICD-10-CM

## 2012-07-22 DIAGNOSIS — J99 Respiratory disorders in diseases classified elsewhere: Secondary | ICD-10-CM | POA: Diagnosis present

## 2012-07-22 DIAGNOSIS — Z0000 Encounter for general adult medical examination without abnormal findings: Secondary | ICD-10-CM

## 2012-07-22 DIAGNOSIS — I10 Essential (primary) hypertension: Secondary | ICD-10-CM | POA: Diagnosis present

## 2012-07-22 DIAGNOSIS — F329 Major depressive disorder, single episode, unspecified: Secondary | ICD-10-CM | POA: Diagnosis present

## 2012-07-22 DIAGNOSIS — F411 Generalized anxiety disorder: Secondary | ICD-10-CM | POA: Diagnosis present

## 2012-07-22 DIAGNOSIS — N179 Acute kidney failure, unspecified: Secondary | ICD-10-CM | POA: Diagnosis present

## 2012-07-22 DIAGNOSIS — N289 Disorder of kidney and ureter, unspecified: Secondary | ICD-10-CM | POA: Diagnosis present

## 2012-07-22 DIAGNOSIS — F3289 Other specified depressive episodes: Secondary | ICD-10-CM | POA: Diagnosis present

## 2012-07-22 DIAGNOSIS — Z888 Allergy status to other drugs, medicaments and biological substances status: Secondary | ICD-10-CM

## 2012-07-22 DIAGNOSIS — Z79899 Other long term (current) drug therapy: Secondary | ICD-10-CM

## 2012-07-22 DIAGNOSIS — D869 Sarcoidosis, unspecified: Principal | ICD-10-CM | POA: Diagnosis present

## 2012-07-22 DIAGNOSIS — F22 Delusional disorders: Secondary | ICD-10-CM

## 2012-07-22 HISTORY — DX: Migraine, unspecified, not intractable, without status migrainosus: G43.909

## 2012-07-22 LAB — CBC WITH DIFFERENTIAL/PLATELET
Basophils Absolute: 0 10*3/uL (ref 0.0–0.1)
Basophils Relative: 0 % (ref 0–1)
Eosinophils Absolute: 0 10*3/uL (ref 0.0–0.7)
Eosinophils Relative: 0 % (ref 0.0–5.0)
HCT: 36 % (ref 36.0–46.0)
HCT: 34.1 % — ABNORMAL LOW (ref 36.0–46.0)
Hemoglobin: 12.4 g/dL (ref 12.0–15.0)
Hemoglobin: 11.8 g/dL — ABNORMAL LOW (ref 12.0–15.0)
Lymphocytes Relative: 13 % (ref 12–46)
Lymphs Abs: 0.8 10*3/uL (ref 0.7–4.0)
Monocytes Absolute: 1 10*3/uL (ref 0.1–1.0)
Monocytes Relative: 15.3 % — ABNORMAL HIGH (ref 3.0–12.0)
Monocytes Relative: 14 % — ABNORMAL HIGH (ref 3–12)
Neutro Abs: 4.7 10*3/uL (ref 1.4–7.7)
Neutro Abs: 4.9 10*3/uL (ref 1.7–7.7)
Neutrophils Relative %: 73 % (ref 43–77)
Platelets: 211 10*3/uL (ref 150.0–400.0)
RBC: 4.44 Mil/uL (ref 3.87–5.11)
RBC: 4.34 MIL/uL (ref 3.87–5.11)
WBC: 6.8 10*3/uL (ref 4.5–10.5)
WBC: 6.7 10*3/uL (ref 4.0–10.5)

## 2012-07-22 LAB — URINALYSIS, ROUTINE W REFLEX MICROSCOPIC
Nitrite: NEGATIVE
Specific Gravity, Urine: 1.015 (ref 1.000–1.030)
pH: 6.5 (ref 5.0–8.0)

## 2012-07-22 LAB — HEPATIC FUNCTION PANEL
ALT: 22 U/L (ref 0–35)
AST: 20 U/L (ref 0–37)
Bilirubin, Direct: 0.1 mg/dL (ref 0.0–0.3)
Total Bilirubin: 0.6 mg/dL (ref 0.3–1.2)

## 2012-07-22 LAB — BASIC METABOLIC PANEL
BUN: 33 mg/dL — ABNORMAL HIGH (ref 6–23)
Chloride: 97 mEq/L (ref 96–112)
Creatinine, Ser: 2.5 mg/dL — ABNORMAL HIGH (ref 0.4–1.2)
GFR: 20.62 mL/min — ABNORMAL LOW (ref >60.00)
Potassium: 3.7 mEq/L (ref 3.5–5.1)

## 2012-07-22 LAB — COMPREHENSIVE METABOLIC PANEL
Albumin: 3.7 g/dL (ref 3.5–5.2)
Alkaline Phosphatase: 101 U/L (ref 39–117)
BUN: 30 mg/dL — ABNORMAL HIGH (ref 6–23)
CO2: 27 mEq/L (ref 19–32)
Chloride: 93 mEq/L — ABNORMAL LOW (ref 96–112)
GFR calc non Af Amer: 21 mL/min — ABNORMAL LOW (ref >90)
Glucose, Bld: 99 mg/dL (ref 70–99)
Potassium: 3.4 mEq/L — ABNORMAL LOW (ref 3.5–5.1)
Total Bilirubin: 0.3 mg/dL (ref 0.3–1.2)

## 2012-07-22 LAB — LIPID PANEL
LDL Cholesterol: 61 mg/dL (ref 0–99)
Total CHOL/HDL Ratio: 3

## 2012-07-22 LAB — TSH: TSH: 4.47 u[IU]/mL (ref 0.35–5.50)

## 2012-07-22 NOTE — Telephone Encounter (Signed)
Patient informed and will return to the lab in the morning.

## 2012-07-22 NOTE — ED Provider Notes (Signed)
History     CSN: [id number]  Arrival date & time 07/22/12  [id number]   First MD Initiated Contact with Patient 07/22/12 2257      Chief Complaint  Patient presents with  . abnormal labs     (Consider location/radiation/quality/duration/timing/severity/associated sxs/prior treatment) HPI Comments: 62 year old female with a history of sarcoidosis, hypertension, hyperlipidemia, obesity. She presents with a complaint of weakness and was told by her family Dr. that she had abnormal lab values today. The patient states that she was supposed to see her family doctor tomorrow for a routine physical and was asked to come in today for laboratory draw. She was called later this evening by her doctor to come to the hospital for emergent evaluation for acute renal failure. The patient states that over the last week she has felt increasingly weak, she has had difficulty walking because her legs feels awake. Does weakness his bilateral, she has no unilateral symptoms and does not have any weakness of her upper extremities. She denies vertigo or a feeling of in balance. She also denies dysuria and states that she has had increased urinary frequency. She has a history of Lasix eye surgery many years ago but states that she's had increased dryness of her eyes and an increased headache over the last couple of weeks. She also admits to having a midabdominal pain which is rather persistent. She denies diarrhea, swelling, rashes or changes in her speech. There has been no slurred speech and no changes in her vision. The patient does take calcium supplementation but was unaware that her calcium was elevated today. She denies any history of thyroid or parathyroid problems and denies any history of cancer.    The weakness was gradual in onset, persistent, gradually worsening, nothing seems to make it better or worse. It is not associated with fevers chills nausea or vomiting and she has had a normal appetite though she does note  a slight weight loss over the last several months.  The history is provided by the patient.    Past Medical History  Diagnosis Date  . ANKLE PAIN, LEFT 04/01/2008  . ANXIETY 04/17/2007  . COLONIC POLYPS, HX OF 08/01/2007  . CONTUSIONS, MULTIPLE 04/01/2009  . DEPRESSION 04/17/2007  . DIZZINESS 08/01/2007  . DYSPNEA 08/01/2007  . Enlargement of lymph nodes 08/13/2007  . GLUCOSE INTOLERANCE 08/01/2007  . HYPERLIPIDEMIA 08/01/2007  . HYPERSOMNIA 07/28/2008  . HYPERTENSION 04/17/2007  . JOINT EFFUSION, LEFT KNEE 06/02/2010  . Loose body in knee 04/01/2009  . MIGRAINES, HX OF 04/17/2007  . Morbid obesity 04/20/2007  . OTHER DISEASES OF LUNG NOT ELSEWHERE CLASSIFIED 08/01/2007  . Pain in joint, lower leg 04/01/2009  . PERIPHERAL EDEMA 04/21/2009  . Sarcoidosis 09/25/2007  . SHOULDER PAIN, LEFT 04/01/2009  . Impaired glucose tolerance 03/23/2011  . Fibroid   . Oligomenorrhea     Past Surgical History  Procedure Date  . Removal uterine fibroids   . Left arm surgury with rod   . S/p right knee arthroscopy   . Lasik vision surg   . Combined mediastinoscopy and bronchoscopy 08/2007  . Gum surgeries   . S/p left knee arthroscopy dec. 2010    Dr. Theda Sers  . Lymph node biopsy     Family History  Problem Relation Age of Onset  . Heart disease Father 30  . Hypertension Father   . Diabetes Mother   . Hypertension Mother   . Stroke Mother   . Hypertension Sister     History  Substance Use Topics  . Smoking status: Never Smoker   . Smokeless tobacco: Not on file  . Alcohol Use: Yes     Comment: rare    OB History    Grav Para Term Preterm Abortions TAB SAB Ect Mult Living   0               Review of Systems  All other systems reviewed and are negative.    Allergies  Floxin; Fluoxetine hcl; and Sulfonamide derivatives  Home Medications   Current Outpatient Rx  Name  Route  Sig  Dispense  Refill  . AMPHETAMINE-DEXTROAMPHETAMINE 20 MG PO TABS   Oral   Take 20 mg by mouth 2 (two)  times daily.         . ASPIRIN 81 MG PO TABS   Oral   Take 81 mg by mouth daily.           . ATENOLOL 50 MG PO TABS   Oral   Take 1 tablet (50 mg total) by mouth daily.   90 tablet   3   . CALCIUM + D PO   Oral   Take 1 tablet by mouth daily.          Marland Kitchen CLORAZEPATE DIPOTASSIUM 7.5 MG PO TABS   Oral   Take 7.5 mg by mouth QID.          Marland Kitchen DESVENLAFAXINE SUCCINATE ER 50 MG PO TB24   Oral   Take 50 mg by mouth daily.         Marland Kitchen DILTIAZEM HCL ER COATED BEADS 240 MG PO CP24   Oral   Take 1 capsule (240 mg total) by mouth daily.   90 capsule   3   . FUROSEMIDE 40 MG PO TABS   Oral   Take 1 tablet (40 mg total) by mouth daily.   90 tablet   3   . GLUCOSAMINE-CHONDROITIN 500-400 MG PO TABS   Oral   Take 1 tablet by mouth 3 (three) times daily.         Marland Kitchen ONE-DAILY MULTI VITAMINS PO TABS   Oral   Take 1 tablet by mouth daily.         Marland Kitchen VALSARTAN 320 MG PO TABS   Oral   Take 1 tablet (320 mg total) by mouth daily.   90 tablet   3     BP 208/64  Pulse 61  Temp 97.9 F (36.6 C) (Oral)  Resp 18  SpO2 98%  Physical Exam  Nursing note and vitals reviewed. Constitutional: She appears well-developed and well-nourished. No distress.  HENT:  Head: Normocephalic and atraumatic.  Mouth/Throat: Oropharynx is clear and moist. No oropharyngeal exudate.  Eyes: Conjunctivae normal and EOM are normal. Pupils are equal, round, and reactive to light. Right eye exhibits no discharge. Left eye exhibits no discharge. No scleral icterus.  Neck: Normal range of motion. Neck supple. No JVD present. No thyromegaly present.  Cardiovascular: Normal rate, regular rhythm, normal heart sounds and intact distal pulses.  Exam reveals no gallop and no friction rub.   No murmur heard. Pulmonary/Chest: Effort normal and breath sounds normal. No respiratory distress. She has no wheezes. She has no rales.  Abdominal: Soft. Bowel sounds are normal. She exhibits no distension and no  mass. There is tenderness ( minimal LUQ ttp.).  Musculoskeletal: Normal range of motion. She exhibits no edema and no tenderness.  Lymphadenopathy:    She has no cervical adenopathy.  Neurological: She is alert. Coordination normal.       The patient is alert and oriented, she has 5 out of 5 strength in all 4 extremities and follows commands flawlessly. She is able to ambulate without difficulty on my exam and has no difficulty with balance. She ambulates unassisted.  Skin: Skin is warm and dry. No rash noted. No erythema.  Psychiatric: She has a normal mood and affect. Her behavior is normal.    ED Course  Procedures (including critical care time)  Labs Reviewed  CBC WITH DIFFERENTIAL - Abnormal; Notable for the following:    Hemoglobin 11.8 (*)     HCT 34.1 (*)     Monocytes Relative 14 (*)     All other components within normal limits  COMPREHENSIVE METABOLIC PANEL - Abnormal; Notable for the following:    Sodium 131 (*)     Potassium 3.4 (*)     Chloride 93 (*)     BUN 30 (*)     Creatinine, Ser 2.38 (*)     Calcium 15.8 (*)     GFR calc non Af Amer 21 (*)     GFR calc Af Amer 25 (*)     All other components within normal limits  URINALYSIS, ROUTINE W REFLEX MICROSCOPIC  TSH  T4   Ct Head Wo Contrast  07/23/2012  *RADIOLOGY REPORT*  Clinical Data: Severe headache.  CT HEAD WITHOUT CONTRAST  Technique:  Contiguous axial images were obtained from the base of the skull through the vertex without contrast.  Comparison: Brain MRI 05/08/2008.  Findings: The brain appears normal without infarct, hemorrhage, mass lesion, mass effect, midline shift or abnormal extra-axial fluid collection.  No hydrocephalus or pneumocephalus.  Calvarium intact.  IMPRESSION: Negative exam.   Original Report Authenticated By: Orlean Patten, M.D.      1. Hypercalcemia   2. Renal failure       MDM  Labs show that the patient is hyponatremic, hypokalemic, acute renal failure with a creatinine of  2.4 and a calcium of close to 16. According to prior lab values from one year ago she had a normal calcium, normal renal function. We'll obtain CT scan of the head due to the patient's increased headache, thyroid function studies, urinalysis to evaluate for UTI. Due to her urinary flow it is unlikely to be an obstructive uropathy.  ED ECG REPORT  I personally interpreted this EKG   Date: 07/23/2012   Rate: 62  Rhythm: normal sinus rhythm and sinus arrhythmia  QRS Axis: normal  Intervals: normal  ST/T Wave abnormalities: normal  Conduction Disutrbances:nonspecific intraventricular conduction delay  Narrative Interpretation:   Old EKG Reviewed: Compared with 03/26/2011, QRS is prolonged from 94-104.  Laboratory data shows a normal urinalysis without dehydration or infection, mild hyponatremia, hypokalemia and renal failure with hyper calcium you. I have discussed the patient's care with the hospitalist who will admit her to the hospital for further evaluation of her hypercalcemia and evaluation for cancer, possible parathyroid dysfunction. CT scan of the head did not show any signs of abnormalities. The patient appears comfortable at this time     Johnna Acosta, MD 07/23/12 [phone]

## 2012-07-22 NOTE — Telephone Encounter (Signed)
Ok for pt to come back in AM -   Will need PTH with calcium, Vit D, spep, vit A (already had albumin and TSH done)  I will order

## 2012-07-22 NOTE — ED Notes (Signed)
Lab called and notified of pt's critical calcium level, notified EDP [place].

## 2012-07-22 NOTE — Telephone Encounter (Signed)
Patient informed of MD instructions.  She agreed to go to the hospital, but would have to wait to go later today after work.

## 2012-07-22 NOTE — Telephone Encounter (Signed)
This is very unusually high  Please call pt - to repeat calcium (hypercalcemia) stat

## 2012-07-22 NOTE — Telephone Encounter (Signed)
CRITICAL LAB RESULTS Calcium 14.8

## 2012-07-22 NOTE — Telephone Encounter (Signed)
Just received full labs from this am;  Pt also with evidence for acute renal failure, likely does have significant hypercalcemia and prob volume depletion as the reason -   Please ask pt to present to ER  - needs urgent eval and tx in ER, and possibly even inpatient stay if the calcium is not reduced and the Kidney function improved easily (may be appropriate for "observation" status to get this done)

## 2012-07-22 NOTE — Telephone Encounter (Signed)
Labs for CPX entered.

## 2012-07-22 NOTE — Addendum Note (Signed)
Addended by: Biagio Borg on: 07/22/2012 01:06 PM   Modules accepted: Orders

## 2012-07-22 NOTE — ED Notes (Signed)
Pt ambulated to bathroom with steady gait. 

## 2012-07-22 NOTE — ED Notes (Signed)
The pt had her physical exam this am and was called back this afternoon and told to come to the ed due to abnormal labs

## 2012-07-23 ENCOUNTER — Encounter: Payer: Managed Care, Other (non HMO) | Admitting: Internal Medicine

## 2012-07-23 ENCOUNTER — Encounter (HOSPITAL_COMMUNITY): Payer: Self-pay | Admitting: Radiology

## 2012-07-23 ENCOUNTER — Inpatient Hospital Stay (HOSPITAL_COMMUNITY): Payer: Managed Care, Other (non HMO)

## 2012-07-23 DIAGNOSIS — F411 Generalized anxiety disorder: Secondary | ICD-10-CM

## 2012-07-23 DIAGNOSIS — D869 Sarcoidosis, unspecified: Principal | ICD-10-CM

## 2012-07-23 DIAGNOSIS — N289 Disorder of kidney and ureter, unspecified: Secondary | ICD-10-CM | POA: Diagnosis present

## 2012-07-23 DIAGNOSIS — N19 Unspecified kidney failure: Secondary | ICD-10-CM

## 2012-07-23 LAB — CBC
Hemoglobin: 12 g/dL (ref 12.0–15.0)
MCH: 27 pg (ref 26.0–34.0)
MCV: 78.8 fL (ref 78.0–100.0)
Platelets: 181 10*3/uL (ref 150–400)
Platelets: 183 10*3/uL (ref 150–400)
RBC: 4.44 MIL/uL (ref 3.87–5.11)
RBC: 4.29 MIL/uL (ref 3.87–5.11)
RDW: 14.3 % (ref 11.5–15.5)
WBC: 6.4 10*3/uL (ref 4.0–10.5)
WBC: 6.4 10*3/uL (ref 4.0–10.5)

## 2012-07-23 LAB — NA AND K (SODIUM & POTASSIUM), RAND UR: Potassium Urine: 26 mEq/L

## 2012-07-23 LAB — CREATININE, SERUM
Creatinine, Ser: 2.47 mg/dL — ABNORMAL HIGH (ref 0.50–1.10)
GFR calc Af Amer: 23 mL/min — ABNORMAL LOW (ref >90)
GFR calc non Af Amer: 20 mL/min — ABNORMAL LOW (ref >90)

## 2012-07-23 LAB — URINALYSIS, ROUTINE W REFLEX MICROSCOPIC
Glucose, UA: NEGATIVE mg/dL
Hgb urine dipstick: NEGATIVE
Ketones, ur: NEGATIVE mg/dL
Leukocytes, UA: NEGATIVE
Protein, ur: NEGATIVE mg/dL

## 2012-07-23 LAB — BASIC METABOLIC PANEL
Calcium: 14.7 mg/dL (ref 8.4–10.5)
Chloride: 98 mEq/L (ref 96–112)
Creatinine, Ser: 2.28 mg/dL — ABNORMAL HIGH (ref 0.50–1.10)
GFR calc Af Amer: 26 mL/min — ABNORMAL LOW (ref >90)
Sodium: 135 mEq/L (ref 135–145)

## 2012-07-23 MED ORDER — CALCITONIN (SALMON) 200 UNIT/ML IJ SOLN
400.0000 [IU] | Freq: Once | INTRAMUSCULAR | Status: AC
  Administered 2012-07-23: 400 [IU] via SUBCUTANEOUS
  Filled 2012-07-23: qty 2

## 2012-07-23 MED ORDER — CALCITONIN (SALMON) 200 UNIT/ML IJ SOLN
400.0000 [IU] | Freq: Two times a day (BID) | INTRAMUSCULAR | Status: DC
  Administered 2012-07-23 – 2012-07-24 (×3): 400 [IU] via SUBCUTANEOUS
  Filled 2012-07-23 (×7): qty 2

## 2012-07-23 MED ORDER — SODIUM CHLORIDE 0.9 % IV SOLN: INTRAVENOUS | Status: DC

## 2012-07-23 MED ORDER — CALCITONIN (SALMON) 200 UNIT/ML IJ SOLN: 4.0000 [IU]/kg | Freq: Two times a day (BID) | INTRAMUSCULAR | Status: DC

## 2012-07-23 MED ORDER — ATENOLOL 50 MG PO TABS
50.0000 mg | ORAL_TABLET | Freq: Every day | ORAL | Status: DC
  Administered 2012-07-23 – 2012-07-25 (×3): 50 mg via ORAL
  Filled 2012-07-23 (×3): qty 1

## 2012-07-23 MED ORDER — ASPIRIN 81 MG PO TABS: 81.0000 mg | ORAL_TABLET | Freq: Every day | ORAL | Status: DC

## 2012-07-23 MED ORDER — VENLAFAXINE HCL ER 75 MG PO CP24
75.0000 mg | ORAL_CAPSULE | Freq: Every day | ORAL | Status: DC
  Administered 2012-07-23 – 2012-07-25 (×3): 75 mg via ORAL
  Filled 2012-07-23 (×4): qty 1

## 2012-07-23 MED ORDER — IOHEXOL 300 MG/ML  SOLN
20.0000 mL | INTRAMUSCULAR | Status: AC
  Administered 2012-07-23 (×2): 20 mL via ORAL

## 2012-07-23 MED ORDER — IRBESARTAN 300 MG PO TABS
300.0000 mg | ORAL_TABLET | Freq: Every day | ORAL | Status: DC
  Filled 2012-07-23: qty 1

## 2012-07-23 MED ORDER — ASPIRIN EC 81 MG PO TBEC
81.0000 mg | DELAYED_RELEASE_TABLET | Freq: Every day | ORAL | Status: DC
  Administered 2012-07-23 – 2012-07-25 (×3): 81 mg via ORAL
  Filled 2012-07-23 (×3): qty 1

## 2012-07-23 MED ORDER — ONDANSETRON HCL 4 MG/2ML IJ SOLN
4.0000 mg | Freq: Four times a day (QID) | INTRAMUSCULAR | Status: DC | PRN
  Administered 2012-07-23: 4 mg via INTRAVENOUS
  Filled 2012-07-23: qty 2

## 2012-07-23 MED ORDER — HEPARIN SODIUM (PORCINE) 5000 UNIT/ML IJ SOLN
5000.0000 [IU] | Freq: Three times a day (TID) | INTRAMUSCULAR | Status: DC
  Administered 2012-07-23 – 2012-07-25 (×6): 5000 [IU] via SUBCUTANEOUS
  Filled 2012-07-23 (×10): qty 1

## 2012-07-23 MED ORDER — CLORAZEPATE DIPOTASSIUM 3.75 MG PO TABS
7.5000 mg | ORAL_TABLET | Freq: Four times a day (QID) | ORAL | Status: DC
  Administered 2012-07-23 – 2012-07-25 (×8): 7.5 mg via ORAL
  Filled 2012-07-23: qty 2
  Filled 2012-07-23: qty 1
  Filled 2012-07-23 (×2): qty 2
  Filled 2012-07-23: qty 1
  Filled 2012-07-23 (×4): qty 2

## 2012-07-23 MED ORDER — PREDNISONE 50 MG PO TABS
60.0000 mg | ORAL_TABLET | Freq: Every day | ORAL | Status: DC
  Administered 2012-07-24 – 2012-07-25 (×2): 60 mg via ORAL
  Filled 2012-07-23 (×4): qty 1

## 2012-07-23 MED ORDER — POTASSIUM CHLORIDE CRYS ER 20 MEQ PO TBCR
40.0000 meq | EXTENDED_RELEASE_TABLET | Freq: Every day | ORAL | Status: DC
  Administered 2012-07-23 – 2012-07-25 (×3): 40 meq via ORAL
  Filled 2012-07-23 (×3): qty 2

## 2012-07-23 MED ORDER — SODIUM CHLORIDE 0.9 % IV SOLN
INTRAVENOUS | Status: DC
  Administered 2012-07-23 – 2012-07-25 (×5): via INTRAVENOUS

## 2012-07-23 MED ORDER — FUROSEMIDE 10 MG/ML IJ SOLN
40.0000 mg | Freq: Three times a day (TID) | INTRAMUSCULAR | Status: DC
  Administered 2012-07-23 (×2): 40 mg via INTRAVENOUS
  Filled 2012-07-23 (×5): qty 4

## 2012-07-23 MED ORDER — SODIUM CHLORIDE 0.9 % IV BOLUS (SEPSIS)
1000.0000 mL | Freq: Once | INTRAVENOUS | Status: AC
  Administered 2012-07-23: 1000 mL via INTRAVENOUS

## 2012-07-23 MED ORDER — DILTIAZEM HCL ER COATED BEADS 240 MG PO CP24
240.0000 mg | ORAL_CAPSULE | Freq: Every day | ORAL | Status: DC
  Administered 2012-07-23 – 2012-07-25 (×3): 240 mg via ORAL
  Filled 2012-07-23 (×3): qty 1

## 2012-07-23 MED ORDER — ACETAMINOPHEN 325 MG PO TABS: 650.0000 mg | ORAL_TABLET | Freq: Four times a day (QID) | ORAL | Status: DC | PRN

## 2012-07-23 NOTE — Progress Notes (Signed)
PATIENT DETAILS Name: Tara [name] Age: 62 y.o. Sex: female Date of Birth: 1951-03-14 Admit Date: 07/22/2012 Admitting Physician Etta Quill, DO [id number] [name], MD  Subjective: Admitted with ARF and hypercalcemia  Assessment/Plan: Principal Problem:  *Hypercalcemia -reviewed her prior history-patient has Biopsy proven sarcoid-previously she was on prednisone -suspect hypercalcemia is from sarcoidosis-repeated a CT Chest today-she now has small nodules in the lung parenchyma -will restart steroids -start Lasix -continue with IVF and Calcitonin -anticipate calcium improving over the next few days, we can try pamidronate if renal function improves further -await PTH, PTH related peptide, Vit D levels, SPEP, UPEP-However suspicion is for active sarcoidosis at this point, therefore will get a ACE level as well  Active Problems:  Renal insufficiency -likely 2/2 sarcoidosis -hydrate and with improvement of hypercalcemia-suspect improvement in renal function as well.  Sarcoidosis -as above -starting steroids -PCCM to see  HTN -continue with atenolol and cardizem -uncontrolled-starting Lasix for hypercalcemia -Valsartan on hold given ARF  Depression/Anxiety -c/w Effexor and Clorazepate  Disposition: Remain inpatient  DVT Prophylaxis: Prophylactic Heparin  Code Status: Full code   Procedures:  None  CONSULTS:  pulmonary/intensive care  PHYSICAL EXAM: Vital signs in last 24 hours: Filed Vitals:   07/23/12 0100 07/23/12 0300 07/23/12 0309 07/23/12 0613  BP: 168/82  191/88 175/84  Pulse: 62  65 67  Temp:   97.6 F (36.4 C) 98.4 F (36.9 C)  TempSrc:   Oral Oral  Resp: 14  18 18   Height:  5' 6.14" (1.68 m) 5\' 6"  (1.676 m)   Weight:  104.3 kg (229 lb 15 oz) 104.6 kg (230 lb 9.6 oz)   SpO2: 100%  100% 100%    Weight change:  Body mass index is 37.22 kg/(m^2).   Gen Exam: Awake and alert with clear speech.   Neck: Supple, No JVD.   Chest: B/L Clear.     CVS: S1 S2 Regular, no murmurs.  Abdomen: soft, BS +, non tender, non distended.  Extremities: no edema, lower extremities warm to touch. Neurologic: Non Focal.   Skin: No Rash.   Wounds: N/A.    Intake/Output from previous day:  Intake/Output Summary (Last 24 hours) at 07/23/12 1340 Last data filed at 07/23/12 0600  Gross per 24 hour  Intake   1551 ml  Output      0 ml  Net   1551 ml     LAB RESULTS: CBC  Lab 07/23/12 0630 07/23/12 0208 07/22/12 1942 07/22/12 0844  WBC 6.4 6.4 6.7 6.8  HGB 11.5* 12.0 11.8* 12.4  HCT 33.8* 35.1* 34.1* 36.0  PLT 183 181 194 211.0  MCV 78.8 79.1 78.6 80.9  MCH 26.8 27.0 27.2 --  MCHC 34.0 34.2 34.6 34.4  RDW 14.3 14.0 14.0 14.5  LYMPHSABS -- -- 0.8 1.0  MONOABS -- -- 0.9 1.0  EOSABS -- -- 0.0 0.0  BASOSABS -- -- 0.0 0.0  BANDABS -- -- -- --    Chemistries   Lab 07/23/12 0630 07/23/12 0300 07/23/12 0208 07/22/12 1942 07/22/12 0844  NA 135 -- -- 131* 134*  K 3.4* -- -- 3.4* 3.7  CL 98 -- -- 93* 97  CO2 27 -- -- 27 31  GLUCOSE 111* -- -- 99 123*  BUN 28* -- -- 30* 33*  CREATININE 2.28* -- 2.47* 2.38* 2.5*  CALCIUM 14.7* -- -- 15.8* 14.8*  MG -- 2.3 -- -- --    CBG: No results found for this basename: GLUCAP:5 in the last 168 hours  GFR Estimated Creatinine Clearance: 32.5 ml/min (by C-G formula based on Cr of 2.28).  Coagulation profile No results found for this basename: INR:5,PROTIME:5 in the last 168 hours  Cardiac Enzymes No results found for this basename: CK:3,CKMB:3,TROPONINI:3,MYOGLOBIN:3 in the last 168 hours  No components found with this basename: POCBNP:3 No results found for this basename: DDIMER:2 in the last 72 hours No results found for this basename: HGBA1C:2 in the last 72 hours  Basename 07/22/12 0844  CHOL 149  HDL 53.50  LDLCALC 61  TRIG 171.0*  CHOLHDL 3  LDLDIRECT --    Basename 07/22/12 2338  TSH 3.379  T4TOTAL 9.7  T3FREE --  THYROIDAB --   No results found for this basename:  VITAMINB12:2,FOLATE:2,FERRITIN:2,TIBC:2,IRON:2,RETICCTPCT:2 in the last 72 hours No results found for this basename: LIPASE:2,AMYLASE:2 in the last 72 hours  Urine Studies No results found for this basename: UACOL:2,UAPR:2,USPG:2,UPH:2,UTP:2,UGL:2,UKET:2,UBIL:2,UHGB:2,UNIT:2,UROB:2,ULEU:2,UEPI:2,UWBC:2,URBC:2,UBAC:2,CAST:2,CRYS:2,UCOM:2,BILUA:2 in the last 72 hours  MICROBIOLOGY: No results found for this or any previous visit (from the past 240 hour(s)).  RADIOLOGY STUDIES/RESULTS: Ct Abdomen Pelvis Wo Contrast  07/23/2012  *RADIOLOGY REPORT*  Clinical Data: Periumbilical pain.  CT ABDOMEN AND PELVIS WITHOUT CONTRAST  Technique:  Multidetector CT imaging of the abdomen and pelvis was performed following the standard protocol without intravenous contrast.  Comparison: None.  Findings: Multiple micronodules are present in the lung bases bilaterally most consistent with some prior infectious or inflammatory process.  There is no pneumothorax or pleural effusion.  The gallbladder, liver, spleen, pancreas and adrenal glands appear normal.  Uterus, adnexa and urinary bladder unremarkable.  The kidneys have a normal appearance.  No urinary tract stones are identified.  The stomach and small and large bowel appear normal. The appendix is not visualized but no evidence of inflammatory process is seen.  No lymphadenopathy or fluid.  There is no focal bony abnormality with degenerative disc disease noted at L5-S1.  IMPRESSION:  1.  No acute finding.  Negative for urinary tract stone. 2.  Multiple micronodules in the lung bases are most consistent with prior infectious or inflammatory process.   Original Report Authenticated By: Orlean Patten, M.D.    Ct Head Wo Contrast  07/23/2012  *RADIOLOGY REPORT*  Clinical Data: Severe headache.  CT HEAD WITHOUT CONTRAST  Technique:  Contiguous axial images were obtained from the base of the skull through the vertex without contrast.  Comparison: Brain MRI 05/08/2008.   Findings: The brain appears normal without infarct, hemorrhage, mass lesion, mass effect, midline shift or abnormal extra-axial fluid collection.  No hydrocephalus or pneumocephalus.  Calvarium intact.  IMPRESSION: Negative exam.   Original Report Authenticated By: Orlean Patten, M.D.    Ct Chest Wo Contrast  07/23/2012  *RADIOLOGY REPORT*  Clinical Data: Hypercalcemia.  History of sarcoid.  CT CHEST WITHOUT CONTRAST  Technique:  Multidetector CT imaging of the chest was performed following the standard protocol without IV contrast.  Comparison: Chest CT 08/11/2007.  Findings:  Mediastinum: Heart size is borderline enlarged. There is no significant pericardial fluid, thickening or pericardial calcification.  There are numerous borderline enlarged and mildly enlarged mediastinal and bilateral hilar lymph nodes, with the largest single node measuring approximately 1.1 cm in short axis in the right paratracheal station.  Hilar lymphadenopathy is poorly evaluated on this noncontrast CT examination.  Esophagus is unremarkable in appearance.  Lungs/Pleura: Scattered throughout the lungs bilaterally there are innumerable pulmonary nodules which appear to be located predominantly in a peribronchovascular distribution, although there are several subpleural nodules in the periphery of  the lungs and along the fissures bilaterally (i.e., this is a perilymphatic distribution of this process).  Additionally, there is an upper lung predominance of these findings with relative sparing of the lung bases.  No acute consolidative airspace disease.  No pleural effusions.  Upper Abdomen: Unremarkable.  Musculoskeletal: There are no aggressive appearing lytic or blastic lesions noted in the visualized portions of the skeleton.  IMPRESSION: 1.  While the previously noted mediastinal and bilateral hilar lymphadenopathy appears to have decreased, there are now innumerable perilymphatic nodules scattered throughout the lungs  bilaterally, with a definite upper lung predominance.  Overall, the findings are compatible with sarcoidosis (i.e., progression from stage I to stage II).   Original Report Authenticated By: [name], M.D.     MEDICATIONS: Scheduled Meds:   . sodium chloride   Intravenous STAT  . aspirin EC  81 mg Oral Daily  . atenolol  50 mg Oral Daily  . calcitonin  400 Units Subcutaneous Q12H  . clorazepate  7.5 mg Oral QID  . diltiazem  240 mg Oral Daily  . furosemide  40 mg Intravenous TID  . heparin  5,000 Units Subcutaneous Q8H  . predniSONE  60 mg Oral Q breakfast  . venlafaxine XR  75 mg Oral Q breakfast   Continuous Infusions:   . sodium chloride 200 mL/hr at 07/23/12 1037   PRN Meds:.acetaminophen, ondansetron (ZOFRAN) IV  Antibiotics: Anti-infectives    None       Oren Binet, MD  [facility] Pager:[phone]  If 7PM-7AM, please contact night-coverage [email or web address] Password [email or web address] 07/23/2012, 1:40 PM   LOS: 1 day

## 2012-07-23 NOTE — Consult Note (Signed)
PULMONARY  / CRITICAL CARE MEDICINE  Name: Tara [name] MRN: [id number] DOB: September 04, 1950    LOS: 1  REFERRING PROVIDER:  Dene Gentry  CHIEF COMPLAINT:  Sarcoidosis  BRIEF PATIENT DESCRIPTION:  62 yo female never smoker went to PCP for routine labs and admitted on 07/22/2012 with renal failure and hypercalcemia.  She has hx of sarcoidosis after LAN bx February 2009, and previously followed by Dr. Annamaria Boots (Last seen 10/04/08).  CT chest showed findings of sarcoidosis and PCCM consulted 1/08.  Significant PMHx of Sarcoidosis, HTN, Depression, Anxiety, Migraines, Hyperlipidemia  EVENTS:   TESTS: PFT 10/02/07 >> FEV1 1.97 (71%), FEV1% 82, TLC 4.27 (74%), DLCO 71%, +BD CT chest 07/23/12 >> b/l hilar and mediastinal LAN up to 1.1 cm, innumerable pulmonary nodules with peribronchovascular distribution with upper lobe predominance   HISTORY OF PRESENT ILLNESS:   62 yo female with prior hx of sarcoidosis (off tx for several yrs) presented to her PCP on 07/21/12 for routine physical.  She noted fatigue, weakness, and nausea.  She had lab work done with showed creatinine 2.5 and calcium 14.8.  She was advised to go to hospital for admission.  After admission she had CT chest which showed b/l upper lobe predominant pulmonary nodules and b/l hilar and mediastinal adenopathy.  She denies recent cough, fever, chills, sweats, gland swelling, weight loss, chest pain, joint swelling, muscle cramps, or skin rash.  She denies history of smoking.  She denies history of pneumonia or tuberculosis.  She has not had recent sick exposures.  She had hx of sleep apnea.  She was told this was mild, and has not been on CPAP therapy.  Of note is that her AHI from sleep study in December 2009 was only 1.4.  Of note is that she had Rt and Lt breast biopsy and Lt axillary node biopsy from February 2013 which showed non-caseating granulomas also.  PAST MEDICAL HISTORY :  Past Medical History  Diagnosis Date  . ANKLE PAIN,  LEFT 04/01/2008  . ANXIETY 04/17/2007  . COLONIC POLYPS, HX OF 08/01/2007  . CONTUSIONS, MULTIPLE 04/01/2009  . DEPRESSION 04/17/2007  . DIZZINESS 08/01/2007  . DYSPNEA 08/01/2007  . Enlargement of lymph nodes 08/13/2007  . GLUCOSE INTOLERANCE 08/01/2007  . HYPERLIPIDEMIA 08/01/2007  . HYPERSOMNIA 07/28/2008  . HYPERTENSION 04/17/2007  . JOINT EFFUSION, LEFT KNEE 06/02/2010  . Loose body in knee 04/01/2009  . MIGRAINES, HX OF 04/17/2007  . Morbid obesity 04/20/2007  . OTHER DISEASES OF LUNG NOT ELSEWHERE CLASSIFIED 08/01/2007  . Pain in joint, lower leg 04/01/2009  . PERIPHERAL EDEMA 04/21/2009  . Sarcoidosis 09/25/2007  . SHOULDER PAIN, LEFT 04/01/2009  . Impaired glucose tolerance 03/23/2011  . Fibroid   . Oligomenorrhea    Past Surgical History  Procedure Date  . Removal uterine fibroids   . Left arm surgury with rod   . S/p right knee arthroscopy   . Lasik vision surg   . Combined mediastinoscopy and bronchoscopy 08/2007  . Gum surgeries   . S/p left knee arthroscopy dec. 2010    Dr. Theda Sers  . Lymph node biopsy    Prior to Admission medications   Medication Sig Start Date End Date Taking? Authorizing Provider  amphetamine-dextroamphetamine (ADDERALL, 20MG ,) 20 MG tablet Take 20 mg by mouth 2 (two) times daily.   Yes Historical Provider, MD  aspirin 81 MG tablet Take 81 mg by mouth daily.     Yes Historical Provider, MD  atenolol (TENORMIN) 50 MG tablet Take 1 tablet (  50 mg total) by mouth daily. 05/23/12  Yes Biagio Borg, MD  Calcium Carbonate-Vitamin D (CALCIUM + D PO) Take 1 tablet by mouth daily.    Yes Historical Provider, MD  clorazepate (TRANXENE) 7.5 MG tablet Take 7.5 mg by mouth QID.    Yes Historical Provider, MD  desvenlafaxine (PRISTIQ) 50 MG 24 hr tablet Take 50 mg by mouth daily.   Yes Historical Provider, MD  diltiazem (CARTIA XT) 240 MG 24 hr capsule Take 1 capsule (240 mg total) by mouth daily. 05/23/12  Yes Biagio Borg, MD  furosemide (LASIX) 40 MG tablet Take 1 tablet  (40 mg total) by mouth daily. 05/22/12  Yes Biagio Borg, MD  glucosamine-chondroitin 500-400 MG tablet Take 1 tablet by mouth 3 (three) times daily.   Yes Historical Provider, MD  Multiple Vitamin (MULTIVITAMIN) tablet Take 1 tablet by mouth daily.   Yes Historical Provider, MD  valsartan (DIOVAN) 320 MG tablet Take 1 tablet (320 mg total) by mouth daily. 05/23/12  Yes Biagio Borg, MD   Allergies  Allergen Reactions  . Floxin (Ocuflox)   . Fluoxetine Hcl   . Sulfonamide Derivatives     FAMILY HISTORY:  Family History  Problem Relation Age of Onset  . Heart disease Father 53  . Hypertension Father   . Diabetes Mother   . Hypertension Mother   . Stroke Mother   . Hypertension Sister    SOCIAL HISTORY:  reports that she has never smoked. She does not have any smokeless tobacco history on file. She reports that she drinks alcohol. She reports that she does not use illicit drugs.  REVIEW OF SYSTEMS:   Negative except above.  INTERVAL HISTORY:   VITAL SIGNS: Temp:  [97.6 F (36.4 C)-98.4 F (36.9 C)] 98.4 F (36.9 C) (01/08 [id number]) Pulse Rate:  [61-67] 67  (01/08 0613) Resp:  [14-20] 18  (01/08 0613) BP: (168-208)/(64-88) 175/84 mmHg (01/08 0613) SpO2:  [98 %-100 %] 100 % (01/08 0613) Weight:  [229 lb 15 oz (104.3 kg)-230 lb 9.6 oz (104.6 kg)] 230 lb 9.6 oz (104.6 kg) (01/08 0309)  PHYSICAL EXAMINATION: General:  No distress Neuro:  Alert, follows commands, normal strength, CN intact, moves all extremities HEENT:  PERRLA, EOMI, no sinus tenderness, MP 3, no oral exudate, no LAN Cardiovascular:  s1s2 regular, no murmur Lungs:  Good air entry, no wheeze/rales Abdomen:  Soft, non tender, + bowel sounds, no organomegaly Musculoskeletal:  No e/c/c Skin:  No rashes   Lab 07/23/12 0630 07/23/12 0208 07/22/12 1942 07/22/12 0844  NA 135 -- 131* 134*  K 3.4* -- 3.4* 3.7  CL 98 -- 93* 97  CO2 27 -- 27 31  BUN 28* -- 30* 33*  CREATININE 2.28* 2.47* 2.38* --  GLUCOSE 111* -- 99  123*    Lab 07/23/12 0630 07/23/12 0208 07/22/12 1942  HGB 11.5* 12.0 11.8*  HCT 33.8* 35.1* 34.1*  WBC 6.4 6.4 6.7  PLT 183 181 194   Ct Abdomen Pelvis Wo Contrast  07/23/2012  *RADIOLOGY REPORT*  Clinical Data: Periumbilical pain.  CT ABDOMEN AND PELVIS WITHOUT CONTRAST  Technique:  Multidetector CT imaging of the abdomen and pelvis was performed following the standard protocol without intravenous contrast.  Comparison: None.  Findings: Multiple micronodules are present in the lung bases bilaterally most consistent with some prior infectious or inflammatory process.  There is no pneumothorax or pleural effusion.  The gallbladder, liver, spleen, pancreas and adrenal glands appear normal.  Uterus,  adnexa and urinary bladder unremarkable.  The kidneys have a normal appearance.  No urinary tract stones are identified.  The stomach and small and large bowel appear normal. The appendix is not visualized but no evidence of inflammatory process is seen.  No lymphadenopathy or fluid.  There is no focal bony abnormality with degenerative disc disease noted at L5-S1.  IMPRESSION:  1.  No acute finding.  Negative for urinary tract stone. 2.  Multiple micronodules in the lung bases are most consistent with prior infectious or inflammatory process.   Original Report Authenticated By: Orlean Patten, M.D.    Ct Head Wo Contrast  07/23/2012  *RADIOLOGY REPORT*  Clinical Data: Severe headache.  CT HEAD WITHOUT CONTRAST  Technique:  Contiguous axial images were obtained from the base of the skull through the vertex without contrast.  Comparison: Brain MRI 05/08/2008.  Findings: The brain appears normal without infarct, hemorrhage, mass lesion, mass effect, midline shift or abnormal extra-axial fluid collection.  No hydrocephalus or pneumocephalus.  Calvarium intact.  IMPRESSION: Negative exam.   Original Report Authenticated By: Orlean Patten, M.D.    Ct Chest Wo Contrast  07/23/2012  *RADIOLOGY REPORT*  Clinical  Data: Hypercalcemia.  History of sarcoid.  CT CHEST WITHOUT CONTRAST  Technique:  Multidetector CT imaging of the chest was performed following the standard protocol without IV contrast.  Comparison: Chest CT 08/11/2007.  Findings:  Mediastinum: Heart size is borderline enlarged. There is no significant pericardial fluid, thickening or pericardial calcification.  There are numerous borderline enlarged and mildly enlarged mediastinal and bilateral hilar lymph nodes, with the largest single node measuring approximately 1.1 cm in short axis in the right paratracheal station.  Hilar lymphadenopathy is poorly evaluated on this noncontrast CT examination.  Esophagus is unremarkable in appearance.  Lungs/Pleura: Scattered throughout the lungs bilaterally there are innumerable pulmonary nodules which appear to be located predominantly in a peribronchovascular distribution, although there are several subpleural nodules in the periphery of the lungs and along the fissures bilaterally (i.e., this is a perilymphatic distribution of this process).  Additionally, there is an upper lung predominance of these findings with relative sparing of the lung bases.  No acute consolidative airspace disease.  No pleural effusions.  Upper Abdomen: Unremarkable.  Musculoskeletal: There are no aggressive appearing lytic or blastic lesions noted in the visualized portions of the skeleton.  IMPRESSION: 1.  While the previously noted mediastinal and bilateral hilar lymphadenopathy appears to have decreased, there are now innumerable perilymphatic nodules scattered throughout the lungs bilaterally, with a definite upper lung predominance.  Overall, the findings are compatible with sarcoidosis (i.e., progression from stage I to stage II).   Original Report Authenticated By: [name], M.D.     ASSESSMENT / PLAN:  62 yo female with b/l pulmonary nodules with upper lob predominance, b/l hilar and mediastinal adenopathy, and hypercalcemia  with hx of sarcoidosis.   Her current lab, and radiographic findings are consistent with sarcoidosis.  Pulmonary sarcoidosis. Plan: Agree with plan for prednisone F/u CXR Monitor oxygenation Don't think she needs repeat lung tissue sampling at this time  Hypercalcemia. Likely related to sarcoidosis. Plan: Continue prednisone Rest per primary team  Acute renal failure. Likely related to hypercalcemia in setting of pulmonary sarcoidosis. Plan: Per primary team   Chesley Mires, MD [facility] Pulmonary/Critical Care 07/23/2012, 2:04 PM Pager:  [phone] After 3pm call: [phone]

## 2012-07-23 NOTE — Progress Notes (Signed)
CRITICAL VALUE ALERT  Critical value received:  Ca 14.7  Date of notification:  1/8  Time of notification:  0751  Critical value read back:y  Nurse who received alert:  Z Wava Kildow  MD notified (1st page):  Ghimire  Time of first page:  0750  MD notified (2nd page):  Time of second page:  Responding MD:  Sloan Leiter  Time MD responded:  [phone]

## 2012-07-23 NOTE — H&P (Signed)
[facility] History and Physical  Tara [name] [id number] DOB: 1951-05-15 DOA: 07/22/2012  Referring physician: ED PCP: Cathlean Cower, MD  Specialists: None  Chief Complaint: Abnormal labs  HPI: Tara [name] is a 62 y.o. female who presents to the ED after getting pre-physical labs which came back with hypercalcemia and elevated creatinine.  Her labs 1 year ago were normal.  The patient does note she has been feeling more fatigued and weak recently and has been having a nagging abdominal pain in her periumbilical area for the past couple of weeks.  In the ED patient was confirmed to have a calcium of 15.8, and creatinine of 2.38.  Hospitalist has been asked to admit.  Review of Systems: 12 systems reviewed and otherwise negative.  Past Medical History  Diagnosis Date  . ANKLE PAIN, LEFT 04/01/2008  . ANXIETY 04/17/2007  . COLONIC POLYPS, HX OF 08/01/2007  . CONTUSIONS, MULTIPLE 04/01/2009  . DEPRESSION 04/17/2007  . DIZZINESS 08/01/2007  . DYSPNEA 08/01/2007  . Enlargement of lymph nodes 08/13/2007  . GLUCOSE INTOLERANCE 08/01/2007  . HYPERLIPIDEMIA 08/01/2007  . HYPERSOMNIA 07/28/2008  . HYPERTENSION 04/17/2007  . JOINT EFFUSION, LEFT KNEE 06/02/2010  . Loose body in knee 04/01/2009  . MIGRAINES, HX OF 04/17/2007  . Morbid obesity 04/20/2007  . OTHER DISEASES OF LUNG NOT ELSEWHERE CLASSIFIED 08/01/2007  . Pain in joint, lower leg 04/01/2009  . PERIPHERAL EDEMA 04/21/2009  . Sarcoidosis 09/25/2007  . SHOULDER PAIN, LEFT 04/01/2009  . Impaired glucose tolerance 03/23/2011  . Fibroid   . Oligomenorrhea    Past Surgical History  Procedure Date  . Removal uterine fibroids   . Left arm surgury with rod   . S/p right knee arthroscopy   . Lasik vision surg   . Combined mediastinoscopy and bronchoscopy 08/2007  . Gum surgeries   . S/p left knee arthroscopy dec. 2010    Dr. Theda Sers  . Lymph node biopsy    Social History:  reports that she has never smoked. She does not have any  smokeless tobacco history on file. She reports that she drinks alcohol. She reports that she does not use illicit drugs.   Allergies  Allergen Reactions  . Floxin (Ocuflox)   . Fluoxetine Hcl   . Sulfonamide Derivatives     Family History  Problem Relation Age of Onset  . Heart disease Father 35  . Hypertension Father   . Diabetes Mother   . Hypertension Mother   . Stroke Mother   . Hypertension Sister      Prior to Admission medications   Medication Sig Start Date End Date Taking? Authorizing Provider  amphetamine-dextroamphetamine (ADDERALL, 20MG ,) 20 MG tablet Take 20 mg by mouth 2 (two) times daily.   Yes Historical Provider, MD  aspirin 81 MG tablet Take 81 mg by mouth daily.     Yes Historical Provider, MD  atenolol (TENORMIN) 50 MG tablet Take 1 tablet (50 mg total) by mouth daily. 05/23/12  Yes Biagio Borg, MD  Calcium Carbonate-Vitamin D (CALCIUM + D PO) Take 1 tablet by mouth daily.    Yes Historical Provider, MD  clorazepate (TRANXENE) 7.5 MG tablet Take 7.5 mg by mouth QID.    Yes Historical Provider, MD  desvenlafaxine (PRISTIQ) 50 MG 24 hr tablet Take 50 mg by mouth daily.   Yes Historical Provider, MD  diltiazem (CARTIA XT) 240 MG 24 hr capsule Take 1 capsule (240 mg total) by mouth daily. 05/23/12  Yes Biagio Borg, MD  furosemide (LASIX) 40 MG tablet Take 1 tablet (40 mg total) by mouth daily. 05/22/12  Yes Biagio Borg, MD  glucosamine-chondroitin 500-400 MG tablet Take 1 tablet by mouth 3 (three) times daily.   Yes Historical Provider, MD  Multiple Vitamin (MULTIVITAMIN) tablet Take 1 tablet by mouth daily.   Yes Historical Provider, MD  valsartan (DIOVAN) 320 MG tablet Take 1 tablet (320 mg total) by mouth daily. 05/23/12  Yes Biagio Borg, MD   Physical Exam: Filed Vitals:   07/22/12 1933 07/22/12 2323  BP: 178/83 208/64  Pulse: 65 61  Temp: 98 F (36.7 C) 97.9 F (36.6 C)  TempSrc: Oral Oral  Resp: 20 18  SpO2: 100% 98%    General:  NAD, resting  comfortably in bed Eyes: PEERLA EOMI ENT: mucous membranes moist Neck: supple w/o JVD Cardiovascular: RRR w/o MRG Respiratory: CTA B Abdomen: soft, very mild diffuse periumbilical tenderness, nd, bs+ Skin: no rash nor lesion Musculoskeletal: MAE, full ROM all 4 extremities Psychiatric: normal tone and affect Neurologic: AAOx3, grossly non-focal  Labs on Admission:  Basic Metabolic Panel:  Lab [id number] 1942 07/22/12 0844  NA 131* 134*  K 3.4* 3.7  CL 93* 97  CO2 27 31  GLUCOSE 99 123*  BUN 30* 33*  CREATININE 2.38* 2.5*  CALCIUM 15.8* 14.8*  MG -- --  PHOS -- --   Liver Function Tests:  Lab 07/22/12 1942 07/22/12 0844  AST 21 20  ALT 19 22  ALKPHOS 101 102  BILITOT 0.3 0.6  PROT 7.5 7.3  ALBUMIN 3.7 3.6   No results found for this basename: LIPASE:5,AMYLASE:5 in the last 168 hours No results found for this basename: AMMONIA:5 in the last 168 hours CBC:  Lab 07/23/12 0208 07/22/12 1942 07/22/12 0844  WBC 6.4 6.7 6.8  NEUTROABS -- 4.9 4.7  HGB 12.0 11.8* 12.4  HCT 35.1* 34.1* 36.0  MCV 79.1 78.6 80.9  PLT 181 194 211.0   Cardiac Enzymes: No results found for this basename: CKTOTAL:5,CKMB:5,CKMBINDEX:5,TROPONINI:5 in the last 168 hours  BNP (last 3 results) No results found for this basename: PROBNP:3 in the last 8760 hours CBG: No results found for this basename: GLUCAP:5 in the last 168 hours  Radiological Exams on Admission: Ct Head Wo Contrast  07/23/2012  *RADIOLOGY REPORT*  Clinical Data: Severe headache.  CT HEAD WITHOUT CONTRAST  Technique:  Contiguous axial images were obtained from the base of the skull through the vertex without contrast.  Comparison: Brain MRI 05/08/2008.  Findings: The brain appears normal without infarct, hemorrhage, mass lesion, mass effect, midline shift or abnormal extra-axial fluid collection.  No hydrocephalus or pneumocephalus.  Calvarium intact.  IMPRESSION: Negative exam.   Original Report Authenticated By: Orlean Patten,  M.D.     EKG: Independently reviewed.  Assessment/Plan Principal Problem:  *Hypercalcemia Active Problems:  Renal insufficiency   1. Hypercalcemia - getting fluid bolus in ED followed by 125cc/hr NS, also ordered calcitonin 4 units/kg, will hold off on ordering a bisphosphonate given lack of symptoms for emergent treatment and issue #2 and instead defer this decision to day team.   PTH/PTHrp are ordered, cancer is at the top of the differential unfortunately as I discussed with patient, given her vague periumbilical pain will start with non-contrast (due to #2) CT abd/pelvis to see if there is a neoplastic process that shows up. 2. Renal insufficiency - acute vs chronic - ordered FeNa, check daily BMPs, fluids for #1 as above.   Code Status: Full  Code (must indicate code status--if unknown or must be presumed, indicate so) Family Communication: no family at bedside (indicate person spoken with, if applicable, with phone number if by telephone) Disposition Plan: admit to inpatient (indicate anticipated LOS)  Time spent: 70 min  Luvina Poirier M. [facility] Pager [phone]  If 7PM-7AM, please contact night-coverage [email or web address] Password [email or web address] 07/23/2012, 2:45 AM

## 2012-07-24 LAB — RENAL FUNCTION PANEL
BUN: 24 mg/dL — ABNORMAL HIGH (ref 6–23)
CO2: 27 mEq/L (ref 19–32)
Chloride: 103 mEq/L (ref 96–112)
Creatinine, Ser: 2.4 mg/dL — ABNORMAL HIGH (ref 0.50–1.10)
Glucose, Bld: 103 mg/dL — ABNORMAL HIGH (ref 70–99)

## 2012-07-24 LAB — PARATHYROID HORMONE, INTACT (NO CA): PTH: 7.2 pg/mL — ABNORMAL LOW (ref 14.0–72.0)

## 2012-07-24 NOTE — Progress Notes (Addendum)
PATIENT DETAILS Name: Tara [name] Age: 62 y.o. Sex: female Date of Birth: August 05, 1950 Admit Date: 07/22/2012 Admitting Physician Etta Quill, DO [id number] [name], MD  Subjective: No major complaints overnight.  Assessment/Plan: Principal Problem:  *Hypercalcemia -reviewed her prior history-patient has Biopsy proven sarcoid-previously she was on prednisone -suspect hypercalcemia is from sarcoidosis-repeated a CT Chest on 1/8-she now has small nodules in the lung parenchyma - On admission she was given IV fluids, and started on calcitonin. Given severe hypercalcemia, she was also started on Lasix. Once her prior records were reviewed and it was confirmed that she did have biopsy proven sarcoidosis, prednisone was started on 1/8. With these measures, her calcium has significantly improved today and is down to 11.6. -will stop Lasix on 1/9-given significant improvement in Calcium -continue with IVF, prednisone and Calcitonin. Calcitonin can be stopped on 1/10 -await PTH, PTH related peptide, Vit D levels, SPEP, UPEP-However suspicion is for active sarcoidosis at this point, tACE level significantly elevated at 86.  Active Problems:  Renal insufficiency -likely 2/2 sarcoidosis - Creatinine still at 2.4, continue with IV fluids but will stop Lasix today. Significant diuresis of 6 L overnight. Hopefully her creatinine would improve once Lasix discontinued - Repeat electrolytes in the morning  Sarcoidosis -as above - On 60 mg of prednisone, taper per PCCM on discharge  - Appreciate PCCM evaluation - Will need followup with Dr. Annamaria Boots on discharge  HTN -continue with atenolol and cardizem -Valsartan on hold given ARF - BP with moderate control and fluctuating, hopefully decreasing the IV fluid will further control it. Monitor for another 24 hours before adjusting medications.  Depression/Anxiety -c/w Effexor and Clorazepate  Disposition: Remain inpatient- possible home in the next  1-2 days.  DVT Prophylaxis: Prophylactic Heparin  Code Status: Full code   Procedures:  None  CONSULTS:  pulmonary/intensive care  PHYSICAL EXAM: Vital signs in last 24 hours: Filed Vitals:   07/23/12 0613 07/23/12 1415 07/23/12 2301 07/24/12 0703  BP: 175/84 166/70 162/70 157/69  Pulse: 67  70 66  Temp: 98.4 F (36.9 C) 98.1 F (36.7 C) 99.1 F (37.3 C) 98.6 F (37 C)  TempSrc: Oral Oral Oral Oral  Resp: 18 18 18 18   Height:      Weight:      SpO2: 100% 94% 95% 98%    Weight change:  Body mass index is 37.22 kg/(m^2).   Gen Exam: Awake and alert with clear speech.   Neck: Supple, No JVD.   Chest: B/L Clear.   CVS: S1 S2 Regular, no murmurs.  Abdomen: soft, BS +, non tender, non distended.  Extremities: no edema, lower extremities warm to touch. Neurologic: Non Focal.   Skin: No Rash.   Wounds: N/A.    Intake/Output from previous day:  Intake/Output Summary (Last 24 hours) at 07/24/12 1249 Last data filed at 07/24/12 0900  Gross per 24 hour  Intake 5655.33 ml  Output   6000 ml  Net -344.67 ml     LAB RESULTS: CBC  Lab 07/23/12 0630 07/23/12 0208 07/22/12 1942 07/22/12 0844  WBC 6.4 6.4 6.7 6.8  HGB 11.5* 12.0 11.8* 12.4  HCT 33.8* 35.1* 34.1* 36.0  PLT 183 181 194 211.0  MCV 78.8 79.1 78.6 80.9  MCH 26.8 27.0 27.2 --  MCHC 34.0 34.2 34.6 34.4  RDW 14.3 14.0 14.0 14.5  LYMPHSABS -- -- 0.8 1.0  MONOABS -- -- 0.9 1.0  EOSABS -- -- 0.0 0.0  BASOSABS -- -- 0.0 0.0  BANDABS -- -- -- --  Chemistries   Lab 07/24/12 0425 07/23/12 0630 07/23/12 0300 07/23/12 0208 07/22/12 1942 07/22/12 0844  NA 139 135 -- -- 131* 134*  K 3.2* 3.4* -- -- 3.4* 3.7  CL 103 98 -- -- 93* 97  CO2 27 27 -- -- 27 31  GLUCOSE 103* 111* -- -- 99 123*  BUN 24* 28* -- -- 30* 33*  CREATININE 2.40* 2.28* -- 2.47* 2.38* 2.5*  CALCIUM 11.6* 14.7* -- -- 15.8* 14.8*  MG -- -- 2.3 -- -- --    CBG: No results found for this basename: GLUCAP:5 in the last 168  hours  GFR Estimated Creatinine Clearance: 30.8 ml/min (by C-G formula based on Cr of 2.4).  Coagulation profile No results found for this basename: INR:5,PROTIME:5 in the last 168 hours  Cardiac Enzymes No results found for this basename: CK:3,CKMB:3,TROPONINI:3,MYOGLOBIN:3 in the last 168 hours  No components found with this basename: POCBNP:3 No results found for this basename: DDIMER:2 in the last 72 hours No results found for this basename: HGBA1C:2 in the last 72 hours  Basename 07/22/12 0844  CHOL 149  HDL 53.50  LDLCALC 61  TRIG 171.0*  CHOLHDL 3  LDLDIRECT --    Basename 07/22/12 2338  TSH 3.379  T4TOTAL 9.7  T3FREE --  THYROIDAB --   No results found for this basename: VITAMINB12:2,FOLATE:2,FERRITIN:2,TIBC:2,IRON:2,RETICCTPCT:2 in the last 72 hours No results found for this basename: LIPASE:2,AMYLASE:2 in the last 72 hours  Urine Studies No results found for this basename: UACOL:2,UAPR:2,USPG:2,UPH:2,UTP:2,UGL:2,UKET:2,UBIL:2,UHGB:2,UNIT:2,UROB:2,ULEU:2,UEPI:2,UWBC:2,URBC:2,UBAC:2,CAST:2,CRYS:2,UCOM:2,BILUA:2 in the last 72 hours  MICROBIOLOGY: No results found for this or any previous visit (from the past 240 hour(s)).  RADIOLOGY STUDIES/RESULTS: Ct Abdomen Pelvis Wo Contrast  07/23/2012  *RADIOLOGY REPORT*  Clinical Data: Periumbilical pain.  CT ABDOMEN AND PELVIS WITHOUT CONTRAST  Technique:  Multidetector CT imaging of the abdomen and pelvis was performed following the standard protocol without intravenous contrast.  Comparison: None.  Findings: Multiple micronodules are present in the lung bases bilaterally most consistent with some prior infectious or inflammatory process.  There is no pneumothorax or pleural effusion.  The gallbladder, liver, spleen, pancreas and adrenal glands appear normal.  Uterus, adnexa and urinary bladder unremarkable.  The kidneys have a normal appearance.  No urinary tract stones are identified.  The stomach and small and large bowel  appear normal. The appendix is not visualized but no evidence of inflammatory process is seen.  No lymphadenopathy or fluid.  There is no focal bony abnormality with degenerative disc disease noted at L5-S1.  IMPRESSION:  1.  No acute finding.  Negative for urinary tract stone. 2.  Multiple micronodules in the lung bases are most consistent with prior infectious or inflammatory process.   Original Report Authenticated By: Orlean Patten, M.D.    Ct Head Wo Contrast  07/23/2012  *RADIOLOGY REPORT*  Clinical Data: Severe headache.  CT HEAD WITHOUT CONTRAST  Technique:  Contiguous axial images were obtained from the base of the skull through the vertex without contrast.  Comparison: Brain MRI 05/08/2008.  Findings: The brain appears normal without infarct, hemorrhage, mass lesion, mass effect, midline shift or abnormal extra-axial fluid collection.  No hydrocephalus or pneumocephalus.  Calvarium intact.  IMPRESSION: Negative exam.   Original Report Authenticated By: Orlean Patten, M.D.    Ct Chest Wo Contrast  07/23/2012  *RADIOLOGY REPORT*  Clinical Data: Hypercalcemia.  History of sarcoid.  CT CHEST WITHOUT CONTRAST  Technique:  Multidetector CT imaging of the chest was performed following the standard protocol without IV contrast.  Comparison: Chest CT 08/11/2007.  Findings:  Mediastinum: Heart size is borderline enlarged. There is no significant pericardial fluid, thickening or pericardial calcification.  There are numerous borderline enlarged and mildly enlarged mediastinal and bilateral hilar lymph nodes, with the largest single node measuring approximately 1.1 cm in short axis in the right paratracheal station.  Hilar lymphadenopathy is poorly evaluated on this noncontrast CT examination.  Esophagus is unremarkable in appearance.  Lungs/Pleura: Scattered throughout the lungs bilaterally there are innumerable pulmonary nodules which appear to be located predominantly in a peribronchovascular  distribution, although there are several subpleural nodules in the periphery of the lungs and along the fissures bilaterally (i.e., this is a perilymphatic distribution of this process).  Additionally, there is an upper lung predominance of these findings with relative sparing of the lung bases.  No acute consolidative airspace disease.  No pleural effusions.  Upper Abdomen: Unremarkable.  Musculoskeletal: There are no aggressive appearing lytic or blastic lesions noted in the visualized portions of the skeleton.  IMPRESSION: 1.  While the previously noted mediastinal and bilateral hilar lymphadenopathy appears to have decreased, there are now innumerable perilymphatic nodules scattered throughout the lungs bilaterally, with a definite upper lung predominance.  Overall, the findings are compatible with sarcoidosis (i.e., progression from stage I to stage II).   Original Report Authenticated By: [name], M.D.     MEDICATIONS: Scheduled Meds:    . aspirin EC  81 mg Oral Daily  . atenolol  50 mg Oral Daily  . calcitonin  400 Units Subcutaneous Q12H  . clorazepate  7.5 mg Oral QID  . diltiazem  240 mg Oral Daily  . heparin  5,000 Units Subcutaneous Q8H  . potassium chloride  40 mEq Oral Daily  . predniSONE  60 mg Oral Q breakfast  . venlafaxine XR  75 mg Oral Q breakfast   Continuous Infusions:    . sodium chloride 75 mL/hr at 07/24/12 1231   PRN Meds:.acetaminophen, ondansetron (ZOFRAN) IV  Antibiotics: Anti-infectives    None       Oren Binet, MD  [facility] Pager:[phone]  If 7PM-7AM, please contact night-coverage [email or web address] Password [email or web address] 07/24/2012, 12:49 PM   LOS: 2 days

## 2012-07-24 NOTE — Progress Notes (Signed)
PULMONARY  / CRITICAL CARE MEDICINE  Name: Tara [name] MRN: [id number] DOB: 10-28-1950    LOS: 2  REFERRING PROVIDER:  Dene Gentry  CHIEF COMPLAINT:  Sarcoidosis  BRIEF PATIENT DESCRIPTION:  49 yowf never smoker went to PCP for routine labs and admitted on 07/22/2012 with renal failure and hypercalcemia.  She has hx of sarcoidosis after LAN bx February 2009, and previously followed by Dr. Annamaria Boots (Last seen 10/04/08).  CT chest showed findings of sarcoidosis and PCCM consulted 1/08.  Significant PMHx of Sarcoidosis, HTN, Depression, Anxiety, Migraines, Hyperlipidemia  EVENTS:   TESTS: PFT 10/02/07 >> FEV1 1.97 (71%), FEV1% 82, TLC 4.27 (74%), DLCO 71%, +BD CT chest 07/23/12 >> b/l hilar and mediastinal LAN up to 1.1 cm, innumerable pulmonary nodules with peribronchovascular distribution with upper lobe predominance  INTERVAL HISTORY:  No c/o, but worried about her sick cat  VITAL SIGNS: Temp:  [98.1 F (36.7 C)-99.1 F (37.3 C)] 98.6 F (37 C) (01/09 0703) Pulse Rate:  [66-70] 66  (01/09 0703) Resp:  [18] 18  (01/09 0703) BP: (157-166)/(69-70) 157/69 mmHg (01/09 0703) SpO2:  [94 %-98 %] 98 % (01/09 0703) FIO2  Room air   PHYSICAL EXAMINATION: General:  No distress Neuro:  Alert, follows commands, normal strength, CN intact, moves all extremities HEENT:  PERRLA, EOMI, no sinus tenderness, MP 3, no oral exudate, no LAN Cardiovascular:  s1s2 regular, no murmur Lungs:  Good air entry, no wheeze/rales Abdomen:  Soft, non tender, + bowel sounds, no organomegaly Musculoskeletal:  No e/c/c Skin:  No rashes   Lab 07/24/12 0425 07/23/12 0630 07/23/12 0208 07/22/12 1942  NA 139 135 -- 131*  K 3.2* 3.4* -- 3.4*  CL 103 98 -- 93*  CO2 27 27 -- 27  BUN 24* 28* -- 30*  CREATININE 2.40* 2.28* 2.47* --  GLUCOSE 103* 111* -- 99    Lab 07/23/12 0630 07/23/12 0208 07/22/12 1942  HGB 11.5* 12.0 11.8*  HCT 33.8* 35.1* 34.1*  WBC 6.4 6.4 6.7  PLT 183 181 194   Lab Results    Component Value Date   CALCIUM 11.6* 07/24/2012   PHOS 2.0* 07/24/2012    ASSESSMENT / PLAN:  62 yo female with b/l pulmonary nodules with upper lob predominance, b/l hilar and mediastinal adenopathy, and hypercalcemia with hx of sarcoidosis.   Her current lab, and radiographic findings are consistent with sarcoidosis.  Pulmonary sarcoidosis. Plan: Cont pred. Will work on slow taper, likely looking at holding at a maintenance dose. Of 20 mg per day until seen by Dr Annamaria Boots as outpt Don't think she needs repeat lung tissue sampling at this time Call [phone] prior to d/c to set up follow-up w/ Dr young  Hypercalcemia. Likely related to sarcoidosis. Plan: Continue prednisone Rest per primary team  Acute renal failure. Likely related to hypercalcemia in setting of pulmonary sarcoidosis. Not better. Agree w/ stopping lasix Plan: Per primary team Not much further to add at this point. Call PRN.     Christinia Gully, MD [facility] [place] [phone] After 5:30 PM or weekends, call [phone]

## 2012-07-25 LAB — RENAL FUNCTION PANEL
BUN: 22 mg/dL (ref 6–23)
Chloride: 102 mEq/L (ref 96–112)
GFR calc Af Amer: 32 mL/min — ABNORMAL LOW (ref >90)
Glucose, Bld: 149 mg/dL — ABNORMAL HIGH (ref 70–99)
Potassium: 3.9 mEq/L (ref 3.5–5.1)
Sodium: 136 mEq/L (ref 135–145)

## 2012-07-25 LAB — PROTEIN ELECTROPHORESIS, SERUM
Albumin ELP: 51.2 % — ABNORMAL LOW (ref 55.8–66.1)
Beta 2: 7 % — ABNORMAL HIGH (ref 3.2–6.5)
Gamma Globulin: 18.4 % (ref 11.1–18.8)
M-Spike, %: NOT DETECTED g/dL

## 2012-07-25 LAB — VITAMIN D 1,25 DIHYDROXY
Vitamin D2 1, 25 (OH)2: <8 pg/mL
Vitamin D3 1, 25 (OH)2: 116 pg/mL

## 2012-07-25 LAB — UIFE/LIGHT CHAINS/TP QN, 24-HR UR
Alpha 2, Urine: DETECTED — AB
Beta, Urine: DETECTED — AB
Free Kappa Lt Chains,Ur: 4.05 mg/dL — ABNORMAL HIGH (ref 0.14–2.42)
Gamma Globulin, Urine: DETECTED — AB
Total Protein, Urine: 6.8 mg/dL

## 2012-07-25 MED ORDER — PREDNISONE 20 MG PO TABS: 60.0000 mg | ORAL_TABLET | Freq: Every day | ORAL | Status: DC

## 2012-07-25 NOTE — Evaluation (Signed)
Physical Therapy Evaluation Patient Details Name: Tara [name] MRN: [id number] DOB: Sep 18, 1950 Today's Date: 07/25/2012 Time: [id number] PT Time Calculation (min): 20 min  PT Assessment / Plan / Recommendation Clinical Impression  Pt admitted with hypercalcemia and acute renal failure. Pt presenting with gernealized weakness and fatigue. Pt will benefit from skilled PT in the acute care setting in order to maximize functional strength and independence prior to d/c home    PT Assessment  Patient needs continued PT services    Follow Up Recommendations  No PT follow up;Supervision - Intermittent    Does the patient have the potential to tolerate intense rehabilitation      Barriers to Discharge        Equipment Recommendations  None recommended by PT    Recommendations for Other Services     Frequency Min 3X/week    Precautions / Restrictions Precautions Precautions: Fall Restrictions Weight Bearing Restrictions: No   Pertinent Vitals/Pain No complaints of pain      Mobility  Bed Mobility Bed Mobility: Supine to Sit Supine to Sit: 4: Min guard Details for Bed Mobility Assistance: Minguard for safety. Pt slow moving, slightly dizzy upon sitting Transfers Transfers: Sit to Stand;Stand to Sit Sit to Stand: 4: Min guard;With upper extremity assist;From bed Stand to Sit: 4: Min guard;With upper extremity assist;To bed Details for Transfer Assistance: Minguard for safety as pt slightly unsteady. Upon standing, pt feeling lightheaded and sat back down. After 2nd stand, pt able to ambulate with less fatigue Ambulation/Gait Ambulation/Gait Assistance: 4: Min guard;4: Min Wellsite geologist (Feet): 100 Feet Assistive device: Other (Comment);None (IV pole) Ambulation/Gait Assistance Details: Min assist without AD, with pt holding IV pole, pt was minguard only. Discussed use of pt's cane upon d/c for increased stability and safety.  Gait Pattern: Step-to pattern Gait  velocity: slow gait speed Stairs: No    Shoulder Instructions     Exercises     PT Diagnosis: Generalized weakness  PT Problem List: Decreased strength;Decreased activity tolerance;Decreased mobility;Decreased balance;Decreased knowledge of use of DME;Decreased safety awareness;Decreased knowledge of precautions PT Treatment Interventions: DME instruction;Gait training;Functional mobility training;Therapeutic activities;Therapeutic exercise;Balance training;Patient/family education   PT Goals Acute Rehab PT Goals PT Goal Formulation: With patient Time For Goal Achievement: 08/01/12 Potential to Achieve Goals: Good Pt will go Supine/Side to Sit: with modified independence PT Goal: Supine/Side to Sit - Progress: Goal set today Pt will go Sit to Supine/Side: with modified independence PT Goal: Sit to Supine/Side - Progress: Goal set today Pt will go Sit to Stand: with modified independence PT Goal: Sit to Stand - Progress: Goal set today Pt will go Stand to Sit: with modified independence PT Goal: Stand to Sit - Progress: Goal set today Pt will Transfer Bed to Chair/Chair to Bed: with modified independence PT Transfer Goal: Bed to Chair/Chair to Bed - Progress: Goal set today Pt will Ambulate: >150 feet;with modified independence;with least restrictive assistive device PT Goal: Ambulate - Progress: Goal set today  Visit Information  Last PT Received On: 07/25/12 Assistance Needed: +1    Subjective Data  Patient Stated Goal: to get better   Prior Functioning  Home Living Lives With: Alone Available Help at Discharge: Neighbor;Available PRN/intermittently Type of Home: House Home Access: Ramped entrance Home Layout: One level Bathroom Shower/Tub: Tub/shower unit;Curtain Biochemist, clinical: Standard Bathroom Accessibility: No Home Adaptive Equipment: Straight cane Prior Function Level of Independence: Independent Able to Take Stairs?: Yes Driving: Yes Vocation: Full time  employment Comments: Proofreader Communication:  No difficulties Dominant Hand: Right    Cognition  Overall Cognitive Status: Appears within functional limits for tasks assessed/performed Arousal/Alertness: Awake/alert Orientation Level: Appears intact for tasks assessed Behavior During Session: [facility] for tasks performed    Extremity/Trunk Assessment Right Lower Extremity Assessment RLE ROM/Strength/Tone: Deficits RLE ROM/Strength/Tone Deficits: grossly 4/5 RLE Sensation: WFL - Light Touch Left Lower Extremity Assessment LLE ROM/Strength/Tone: Deficits LLE ROM/Strength/Tone Deficits: grossly 4/5 LLE Sensation: WFL - Light Touch   Balance    End of Session PT - End of Session Equipment Utilized During Treatment: Gait belt Activity Tolerance: Patient limited by fatigue Patient left: in chair;with call bell/phone within reach Nurse Communication: Mobility status  GP     Ambrose Finland 07/25/2012, 12:13 PM  07/25/2012 Ambrose Finland DPT PAGER: [phone] OFFICE: [phone]

## 2012-07-25 NOTE — Discharge Summary (Signed)
Physician Discharge Summary  Tara [name] [id number] DOB: 16-Feb-1951 DOA: 07/22/2012  PCP: Tara Cower, MD  Admit date: 07/22/2012 Discharge date: 07/25/2012  Time spent: 25 minutes  Recommendations for Outpatient Follow-up:  1. Patient will follow up with her primary care physician in the next few weeks. 2. She will hold on her Lasix and Diovan until she follows up with her primary care physician who can reassess how her renal function is doing 3. Primary care physician will follow up on patient's serum and urine protein electrophoresis  Discharge Diagnoses:  Principal Problem:  *Hypercalcemia Active Problems:  Sarcoidosis  HYPERLIPIDEMIA  HYPERTENSION  Renal insufficiency   Discharge Condition: Improved, being discharged home  Diet recommendation: Low-sodium  Filed Weights   07/23/12 0300 07/23/12 0309  Weight: 104.3 kg (229 lb 15 oz) 104.6 kg (230 lb 9.6 oz)    History of present illness:  Tara [name] is a 62 y.o. female who presents to the ED after getting pre-physical labs which came back with hypercalcemia and elevated creatinine. Her labs 1 year ago were normal. The patient does note she has been feeling more fatigued and weak recently and has been having a nagging abdominal pain in her periumbilical area for the past couple of weeks.   In the ED patient was confirmed to have a calcium of 15.8, and creatinine of 2.38. Hospitalist has been asked to admit.  Hospital Course:  *Hypercalcemia  -reviewed her prior history-patient has Biopsy proven sarcoid-previously she was on prednisone  -suspect hypercalcemia is from sarcoidosis-repeated a CT Chest on 1/8-she now has small nodules in the lung parenchyma  - On admission she was given IV fluids, and started on calcitonin. Given severe hypercalcemia, she was also started on Lasix. Once her prior records were reviewed and it was confirmed that she did have biopsy proven sarcoidosis, prednisone was started on 1/8. With these  measures, her calcium has significantly improved today and is down to 10.4 on day of discharge Lasix was stopped on 1/9 given significant improvement in calcium. Patient was continued on continue with IVF, prednisone. Calcitonin was stopped after 1/9 PTH level came back suppressed, consistent with hypercalcemia from outside source. Serum and urine electrophoresis is pending will be followed by primary care physician Active Problems:  Renal insufficiency  -likely 2/2 sarcoidosis  Lasix was discontinued on 1/9 and creatinine improved from 2.4 down to 1.93. Plan will be to discharge patient home holding Lasix and Diovan until patient follows with her primary care physician  Sarcoidosis  -as above  - On 60 mg of prednisone, patient will continue on this dose for the next month followed by a taper slowly of 5-10 per few weeks - Appreciate PCCM evaluation  - Will need followup with Dr. Annamaria Boots on discharge  HTN  -continue with atenolol and cardizem  Lasix and valsartan put on hold because of renal failure. Patient will hold on these medications even as outpatient as she follows up with her primary care physician Depression/Anxiety  -c/w Effexor and Clorazepate   Procedures:  None  Consultations:  Pulmonary medicine  Discharge Exam: Filed Vitals:   07/24/12 0703 07/24/12 1325 07/24/12 2204 07/25/12 0605  BP: 157/69 157/79 171/73 162/80  Pulse: 66 68 70 70  Temp: 98.6 F (37 C) 97.8 F (36.6 C) 98.2 F (36.8 C) 98.4 F (36.9 C)  TempSrc: Oral  Oral Oral  Resp: 18 20 18 16   Height:      Weight:      SpO2: 98% 97% 98%  98%    General: Alert and oriented x3, no acute distress, fatigued Cardiovascular: Regular rate and rhythm, S1-S2 Respiratory: Clear to auscultation bilaterally Abdomen: Soft, nontender, nondistended, positive bowel sounds next  extremities: No clubbing or cyanosis, trace pitting edema  Discharge Instructions  Discharge Orders    Future Orders Please Complete  By Expires   Diet - low sodium heart healthy      Increase activity slowly          Medication List     As of 07/25/2012 11:18 AM    STOP taking these medications         CALCIUM + D PO      furosemide 40 MG tablet   Commonly known as: LASIX      valsartan 320 MG tablet   Commonly known as: DIOVAN      TAKE these medications         ADDERALL 20 MG tablet   Generic drug: amphetamine-dextroamphetamine   Take 20 mg by mouth 2 (two) times daily.      aspirin 81 MG tablet   Take 81 mg by mouth daily.      atenolol 50 MG tablet   Commonly known as: TENORMIN   Take 1 tablet (50 mg total) by mouth daily.      clorazepate 7.5 MG tablet   Commonly known as: TRANXENE   Take 7.5 mg by mouth QID.      diltiazem 240 MG 24 hr capsule   Commonly known as: CARDIZEM CD   Take 1 capsule (240 mg total) by mouth daily.      glucosamine-chondroitin 500-400 MG tablet   Take 1 tablet by mouth 3 (three) times daily.      multivitamin tablet   Take 1 tablet by mouth daily.      predniSONE 20 MG tablet   Commonly known as: DELTASONE   Take 3 tablets (60 mg total) by mouth daily.      PRISTIQ 50 MG 24 hr tablet   Generic drug: desvenlafaxine   Take 50 mg by mouth daily.           Follow-up Information    Follow up with Tara Cower, MD. Schedule an appointment as soon as possible for a visit in 2 weeks. (Stop Diovan & Lasix until you talk to [place])    Contact information:   [address] [place] [address] [phone]           The results of significant diagnostics from this hospitalization (including imaging, microbiology, ancillary and laboratory) are listed below for reference.    Significant Diagnostic Studies: Ct Abdomen Pelvis Wo Contrast  07/23/2012    IMPRESSION:  1.  No acute finding.  Negative for urinary tract stone. 2.  Multiple micronodules in the lung bases are most consistent with prior infectious or inflammatory process.    Original Report Authenticated By: Orlean Patten, M.D.    Ct Head Wo Contrast  07/23/2012    IMPRESSION: Negative exam.   Original Report Authenticated By: Orlean Patten, M.D.    Ct Chest Wo Contrast  07/23/2012  IMPRESSION: 1.  While the previously noted mediastinal and bilateral hilar lymphadenopathy appears to have decreased, there are now innumerable perilymphatic nodules scattered throughout the lungs bilaterally, with a definite upper lung predominance.  Overall, the findings are compatible with sarcoidosis (i.e., progression from stage I to stage II).   Original Report Authenticated By: [name], M.D.  Microbiology: No results found for this or any previous visit (from the past 240 hour(s)).   Labs: Basic Metabolic Panel:  Lab [id number] 07/24/12 0425 07/23/12 0630 07/23/12 0300 07/23/12 0208 07/22/12 1942 07/22/12 0844  NA 136 139 135 -- -- 131* 134*  K 3.9 3.2* 3.4* -- -- 3.4* 3.7  CL 102 103 98 -- -- 93* 97  CO2 24 27 27  -- -- 27 31  GLUCOSE 149* 103* 111* -- -- 99 123*  BUN 22 24* 28* -- -- 30* 33*  CREATININE 1.93* 2.40* 2.28* -- 2.47* 2.38* --  CALCIUM 10.4 11.6* 14.7* -- -- 15.8* 14.8*  MG -- -- -- 2.3 -- -- --  PHOS 1.3* 2.0* -- 3.5 -- -- --   Liver Function Tests:  Lab 07/25/12 0430 07/24/12 0425 07/22/12 1942 07/22/12 0844  AST -- -- 21 20  ALT -- -- 19 22  ALKPHOS -- -- 101 102  BILITOT -- -- 0.3 0.6  PROT -- -- 7.5 7.3  ALBUMIN 2.9* 3.1* 3.7 3.6   CBC:  Lab 07/23/12 0630 07/23/12 0208 07/22/12 1942 07/22/12 0844  WBC 6.4 6.4 6.7 6.8  NEUTROABS -- -- 4.9 4.7  HGB 11.5* 12.0 11.8* 12.4  HCT 33.8* 35.1* 34.1* 36.0  MCV 78.8 79.1 78.6 80.9  PLT 183 181 194 211.0      Signed:  Emiko Osorto K  [facility] 07/25/2012, 11:18 AM

## 2012-07-25 NOTE — Discharge Planning (Signed)
d'cd per w/c with all personal belongings to private car home. Has copy of home instructions

## 2012-07-25 NOTE — Progress Notes (Signed)
Paged Pincus Sanes, NP to clarify if patient needed to receive calcitonin this am. Per Pincus Sanes RN to hold am dose and clarify with rounding physician this am.

## 2012-07-29 ENCOUNTER — Encounter: Payer: Self-pay | Admitting: Internal Medicine

## 2012-07-29 ENCOUNTER — Other Ambulatory Visit (INDEPENDENT_AMBULATORY_CARE_PROVIDER_SITE_OTHER): Payer: Managed Care, Other (non HMO)

## 2012-07-29 ENCOUNTER — Ambulatory Visit (INDEPENDENT_AMBULATORY_CARE_PROVIDER_SITE_OTHER): Payer: Managed Care, Other (non HMO) | Admitting: Internal Medicine

## 2012-07-29 VITALS — BP 142/90 | HR 73 | Temp 98.1°F | Ht 64.0 in | Wt 233.2 lb

## 2012-07-29 DIAGNOSIS — R609 Edema, unspecified: Secondary | ICD-10-CM

## 2012-07-29 DIAGNOSIS — I10 Essential (primary) hypertension: Secondary | ICD-10-CM

## 2012-07-29 DIAGNOSIS — N289 Disorder of kidney and ureter, unspecified: Secondary | ICD-10-CM

## 2012-07-29 DIAGNOSIS — Z0000 Encounter for general adult medical examination without abnormal findings: Secondary | ICD-10-CM

## 2012-07-29 MED ORDER — VALSARTAN 320 MG PO TABS: 320.0000 mg | ORAL_TABLET | Freq: Every day | ORAL | Status: DC

## 2012-07-29 NOTE — Patient Instructions (Addendum)
Please take all new medication as prescribed - to re-start the [place] to hold off on the lasix for now Please continue all other medications as before, and refills have been done if requested. Please have the pharmacy call with any other refills you may need. You are otherwise up to date with prevention measures today. Please go to the LAB in the Basement (turn left off the elevator) for the tests to be done today You will be contacted by phone if any changes need to be made immediately.  Otherwise, you will receive a letter about your results with an explanation, but please check with MyChart first. Thank you for enrolling in [facility]. Please follow the instructions below to securely access your online medical record. MyChart allows you to send messages to your doctor, view your test results, renew your prescriptions, schedule appointments, and more. To Log into My Chart online, please go by [employer] or [employer] to [email or web address], or download the MyChart App from the [employer].  Your Username is:  [email or web address] Please keep your appointments with your specialists as you have planned  - Dr Charyl Dancer for the sarcoid Please return in 6 months, or sooner if needed

## 2012-07-30 ENCOUNTER — Encounter: Payer: Self-pay | Admitting: Internal Medicine

## 2012-07-30 LAB — BASIC METABOLIC PANEL
CO2: 26 mEq/L (ref 19–32)
GFR: 38.58 mL/min — ABNORMAL LOW (ref >60.00)
Glucose, Bld: 106 mg/dL — ABNORMAL HIGH (ref 70–99)
Potassium: 3.5 mEq/L (ref 3.5–5.1)
Sodium: 136 mEq/L (ref 135–145)

## 2012-07-30 LAB — PTH-RELATED PEPTIDE: PTH-related peptide: 21 pg/mL (ref 14–27)

## 2012-07-30 NOTE — Progress Notes (Signed)
Subjective:    Patient ID: Tara [name], female    DOB: 11-13-1950, 62 y.o.   MRN: [id number]  HPI Here for wellness and f/u;  Overall doing ok;  Pt denies CP, worsening SOB, DOE, wheezing, orthopnea, PND, worsening LE edema, palpitations, dizziness or syncope.  Pt denies neurological change such as new headache, facial or extremity weakness.  Pt denies polydipsia, polyuria, or low sugar symptoms. Pt states overall good compliance with treatment and medications, good tolerability, and has been trying to follow lower cholesterol diet.  Pt denies worsening depressive symptoms, suicidal ideation or panic. No fever, night sweats, wt loss, loss of appetite, or other constitutional symptoms.  Pt states good ability with ADL's, has low fall risk, home safety reviewed and adequate, no other significant changes in hearing or vision, and only occasionally active with exercise.  Was hospd recently with elev calcium, due to sardoisos, has f/u with Dr Annamaria Boots soon.  Needs f/u bmet/calcium, never had SPEP actually drawn yet.  Still holdin diovan and lasix Past Medical History  Diagnosis Date  . ANKLE PAIN, LEFT 04/01/2008  . ANXIETY 04/17/2007  . COLONIC POLYPS, HX OF 08/01/2007  . CONTUSIONS, MULTIPLE 04/01/2009  . DEPRESSION 04/17/2007  . DIZZINESS 08/01/2007  . DYSPNEA 08/01/2007  . Enlargement of lymph nodes 08/13/2007  . GLUCOSE INTOLERANCE 08/01/2007  . HYPERLIPIDEMIA 08/01/2007  . HYPERSOMNIA 07/28/2008  . HYPERTENSION 04/17/2007  . JOINT EFFUSION, LEFT KNEE 06/02/2010  . Loose body in knee 04/01/2009  . Morbid obesity 04/20/2007  . OTHER DISEASES OF LUNG NOT ELSEWHERE CLASSIFIED 08/01/2007  . Pain in joint, lower leg 04/01/2009  . PERIPHERAL EDEMA 04/21/2009  . Sarcoidosis 09/25/2007  . SHOULDER PAIN, LEFT 04/01/2009  . Impaired glucose tolerance 03/23/2011  . Fibroid   . Oligomenorrhea   . Migraines     "stopped 3-4 yr ago" (07/23/2012)   Past Surgical History  Procedure Date  . Myomectomy 1994  . Fracture  surgery ?02/1997    "left upper arm; put rod in" (07/23/2012)  . Refractive surgery 08/1998    "both eyes" (07/23/2012)  . Combined mediastinoscopy and bronchoscopy 08/2007  . Gum surgery 2000-?2009    "several ORs; soft tissue graft; took material from roof of mouth" (07/23/2012  . Knee arthroscopy 03/2004; 06/2009    "right; left" Dr. Theda Sers  . Lymph node biopsy ~ 2009    "for sarcoidosis; don't know exactly which nodes" (07/23/2102)    reports that she has never smoked. She has never used smokeless tobacco. She reports that she drinks alcohol. She reports that she does not use illicit drugs. family history includes Diabetes in her mother; Heart disease (age of onset:78) in her father; Hypertension in her father, mother, and sister; and Stroke in her mother. Allergies  Allergen Reactions  . Floxin (Ocuflox)   . Fluoxetine Hcl   . Sulfonamide Derivatives    Current Outpatient Prescriptions on File Prior to Visit  Medication Sig Dispense Refill  . amphetamine-dextroamphetamine (ADDERALL, 20MG ,) 20 MG tablet Take 20 mg by mouth 2 (two) times daily.      Marland Kitchen aspirin 81 MG tablet Take 81 mg by mouth daily.        Marland Kitchen atenolol (TENORMIN) 50 MG tablet Take 1 tablet (50 mg total) by mouth daily.  90 tablet  3  . clorazepate (TRANXENE) 7.5 MG tablet Take 7.5 mg by mouth QID.       Marland Kitchen desvenlafaxine (PRISTIQ) 50 MG 24 hr tablet Take 50 mg by mouth daily.      Marland Kitchen  diltiazem (CARTIA XT) 240 MG 24 hr capsule Take 1 capsule (240 mg total) by mouth daily.  90 capsule  3  . glucosamine-chondroitin 500-400 MG tablet Take 1 tablet by mouth 3 (three) times daily.      . Multiple Vitamin (MULTIVITAMIN) tablet Take 1 tablet by mouth daily.      . predniSONE (DELTASONE) 20 MG tablet Take 3 tablets (60 mg total) by mouth daily.  90 tablet  1   Review of Systems Constitutional: Negative for diaphoresis, activity change, appetite change or unexpected weight change.  HENT: Negative for hearing loss, ear pain, facial  swelling, mouth sores and neck stiffness.   Eyes: Negative for pain, redness and visual disturbance.  Respiratory: Negative for shortness of breath and wheezing.   Cardiovascular: Negative for chest pain and palpitations.  Gastrointestinal: Negative for diarrhea, blood in stool, abdominal distention or other pain Genitourinary: Negative for hematuria, flank pain or change in urine volume.  Musculoskeletal: Negative for myalgias and joint swelling.  Skin: Negative for color change and wound.  Neurological: Negative for syncope and numbness. other than noted Hematological: Negative for adenopathy.  Psychiatric/Behavioral: Negative for hallucinations, self-injury, decreased concentration and agitation.      Objective:   Physical Exam BP 142/90  Pulse 73  Temp 98.1 F (36.7 C) (Oral)  Ht 5\' 4"  (1.626 m)  Wt 233 lb 4 oz (105.802 kg)  BMI 40.04 kg/m2  SpO2 95% VS noted,  Constitutional: Pt is oriented to person, place, and time. Appears well-developed and well-nourished./obese  Head: Normocephalic and atraumatic.  Right Ear: External ear normal.  Left Ear: External ear normal.  Nose: Nose normal.  Mouth/Throat: Oropharynx is clear and moist.  Eyes: Conjunctivae and EOM are normal. Pupils are equal, round, and reactive to light.  Neck: Normal range of motion. Neck supple. No JVD present. No tracheal deviation present.  Cardiovascular: Normal rate, regular rhythm, normal heart sounds and intact distal pulses.   Pulmonary/Chest: Effort normal and breath sounds normal.  Abdominal: Soft. Bowel sounds are normal. There is no tenderness. No HSM  Musculoskeletal: Normal range of motion. Exhibits no edema.  Lymphadenopathy:  Has no cervical adenopathy.  Neurological: Pt is alert and oriented to person, place, and time. Pt has normal reflexes. No cranial nerve deficit.  Skin: Skin is warm and dry. No rash noted.  Psychiatric:  Has  normal mood and affect. Behavior is normal.     Assessment &  Plan:

## 2012-07-30 NOTE — Assessment & Plan Note (Signed)
To cont holding lasix

## 2012-07-30 NOTE — Assessment & Plan Note (Signed)
stable overall by history and exam, recent data reviewed with pt,  to f/u any worsening symptoms or concerns - to re-start diovan

## 2012-07-30 NOTE — Assessment & Plan Note (Signed)
For f/u lab/ca+2, for SPEP as well but elev ca likely related to sarcoid

## 2012-07-30 NOTE — Assessment & Plan Note (Signed)

## 2012-08-01 LAB — PROTEIN ELECTROPHORESIS, SERUM
Albumin ELP: 54.2 % — ABNORMAL LOW (ref 55.8–66.1)
Alpha-2-Globulin: 12.7 % — ABNORMAL HIGH (ref 7.1–11.8)
Beta Globulin: 5.9 % (ref 4.7–7.2)
Total Protein, Serum Electrophoresis: 6.9 g/dL (ref 6.0–8.3)

## 2012-08-04 ENCOUNTER — Other Ambulatory Visit: Payer: Managed Care, Other (non HMO)

## 2012-08-04 LAB — VITAMIN A: Vitamin A (Retinoic Acid): 82 ug/dL (ref 38–98)

## 2012-08-06 LAB — UPEP/TP, 24-HR URINE: Total Protein, Urine: 19 mg/dL

## 2012-08-22 ENCOUNTER — Encounter: Payer: Self-pay | Admitting: Internal Medicine

## 2012-08-22 ENCOUNTER — Other Ambulatory Visit (INDEPENDENT_AMBULATORY_CARE_PROVIDER_SITE_OTHER): Payer: Managed Care, Other (non HMO)

## 2012-08-22 ENCOUNTER — Ambulatory Visit (INDEPENDENT_AMBULATORY_CARE_PROVIDER_SITE_OTHER): Payer: Managed Care, Other (non HMO) | Admitting: Internal Medicine

## 2012-08-22 VITALS — BP 148/92 | HR 78 | Ht 66.0 in | Wt 243.0 lb

## 2012-08-22 DIAGNOSIS — D869 Sarcoidosis, unspecified: Secondary | ICD-10-CM

## 2012-08-22 DIAGNOSIS — N289 Disorder of kidney and ureter, unspecified: Secondary | ICD-10-CM

## 2012-08-22 LAB — BASIC METABOLIC PANEL
BUN: 29 mg/dL — ABNORMAL HIGH (ref 6–23)
CO2: 26 mEq/L (ref 19–32)
Calcium: 9.1 mg/dL (ref 8.4–10.5)
Glucose, Bld: 182 mg/dL — ABNORMAL HIGH (ref 70–99)
Sodium: 132 mEq/L — ABNORMAL LOW (ref 135–145)

## 2012-08-22 NOTE — Patient Instructions (Addendum)
Prednisone- reduce now to 50 mg daily ( 2 twenties and a half= 10 mg) x 7 days                     Then 40 mg daily x 1 week, then 30 mg (1 twenty and a half) until you see me next  Order lab- ACE, BMET   Dx sarcoid  Continue to drink generous amounts of water  Please call as needed

## 2012-08-22 NOTE — Assessment & Plan Note (Signed)
We discussed sarcoid and we discussed prednisone purpose, side effects and alternatives, answering her questions Plan-she has completed one month prednisone 60 mg daily. Gradually taper. Lab for chemistry followup with potential for hyperglycemia and need to track renal function. Metabolic management decisions are directed to Dr. [name] as her PCP.

## 2012-08-22 NOTE — Assessment & Plan Note (Signed)
After hospital 1/7 through 07/25/12 creatinine was down to 1.93. Denies history of diabetes.

## 2012-08-22 NOTE — Progress Notes (Signed)
08/22/12- 61 yoF never smoker followed for sarcoid complicated by hypercalcemia, renal insufficency  Post Hospital-also would like to know about Iron supplement. Dr [name] PCP, Dr [facility]. Last seen here 10/04/08- had been dx'd w/ OSA but disliked CPAP and f/u NPSG 06/25/08 was negative so that issue considered resolved Sarcoid dx'd 2009 - mediastinoscopy and bronchoscopy. Prednisone x 1 year. Now hosp 1/7-1/10/14 for hypercalcemia and renal insufficiency associated with multiple pulmonary nodules of stage II sarcoid. Breast biopsy positive for noncaseating granulomas. Started on prednisone 60 mg daily at that time, anticipating one month to reduce by 5-10 mg increments. She notes that eyes burn and visual acuity is not as sharp, appetite increased on steroids. No night sweats or fever and little cough. ACE 07/23/12  86 (8-52) CT chest 07/23/12 IMPRESSION:  1. While the previously noted mediastinal and bilateral hilar  lymphadenopathy appears to have decreased, there are now  innumerable perilymphatic nodules scattered throughout the lungs  bilaterally, with a definite upper lung predominance. Overall, the  findings are compatible with sarcoidosis (i.e., progression from  stage I to stage II).  Original Report Authenticated By: [name], M.D.  ROS-see HPI Constitutional:   No-   weight loss, night sweats, fevers, +chills, no-fatigue, lassitude. HEENT:   No-  headaches, difficulty swallowing, tooth/dental problems, sore throat,       No-  sneezing, itching, ear ache, nasal congestion, post nasal drip,  CV:  No-   chest pain, orthopnea, PND, swelling in lower extremities, anasarca,                                  dizziness, palpitations Resp: No-   shortness of breath with exertion or at rest.              No-   productive cough,  No non-productive cough,  No- coughing up of blood.              No-   change in color of mucus.  No- wheezing.   Skin: No-   rash or lesions. GI:  No-    heartburn, indigestion, abdominal pain, nausea, vomiting, diarrhea,                 change in bowel habits, loss of appetite GU: No-   dysuria, change in color of urine, no urgency or frequency.  No- flank pain. MS:  No-   joint pain or swelling.  No- decreased range of motion.  No- back pain. Neuro-     nothing unusual Psych:  No- change in mood or affect. No depression or anxiety.  No memory loss.  OBJ- Physical Exam General- Alert, Oriented, Affect-appropriate, Distress- none acute Skin- rash-none, lesions- none, excoriation- none Lymphadenopathy- none Head- atraumatic            Eyes- Gross vision intact, PERRLA, conjunctivae and secretions clear, frequent blinking            Ears- Hearing, canals-normal            Nose- Clear, no-Septal dev, mucus, polyps, erosion, perforation             Throat- Mallampati II , mucosa clear , drainage- none, tonsils- atrophic Neck- flexible , trachea midline, no stridor , thyroid nl, carotid no bruit Chest - symmetrical excursion , unlabored           Heart/CV- RRR , no murmur , no gallop  ,  no rub, nl s1 s2                           - JVD- none , edema- none, stasis changes- none, varices- none           Lung- clear to P&A, wheeze- none, cough- none , dullness-none, rub- none           Chest wall-  Abd- tender-no, distended-no, bowel sounds-present, HSM- no Br/ Gen/ Rectal- Not done, not indicated Extrem- cyanosis- none, clubbing, none, atrophy- none, strength- nl Neuro- blinking, grimacing

## 2012-08-22 NOTE — Assessment & Plan Note (Signed)
This should respond clinically with treatment of sarcoid

## 2012-08-27 ENCOUNTER — Ambulatory Visit: Payer: Managed Care, Other (non HMO) | Admitting: Internal Medicine

## 2012-08-27 ENCOUNTER — Encounter: Payer: Self-pay | Admitting: Internal Medicine

## 2012-08-27 ENCOUNTER — Other Ambulatory Visit (INDEPENDENT_AMBULATORY_CARE_PROVIDER_SITE_OTHER): Payer: Managed Care, Other (non HMO)

## 2012-08-27 ENCOUNTER — Ambulatory Visit (INDEPENDENT_AMBULATORY_CARE_PROVIDER_SITE_OTHER): Payer: Managed Care, Other (non HMO) | Admitting: Internal Medicine

## 2012-08-27 VITALS — BP 122/72 | HR 73 | Temp 97.0°F | Ht 66.0 in | Wt 239.2 lb

## 2012-08-27 DIAGNOSIS — R7302 Impaired glucose tolerance (oral): Secondary | ICD-10-CM

## 2012-08-27 DIAGNOSIS — R7309 Other abnormal glucose: Secondary | ICD-10-CM

## 2012-08-27 DIAGNOSIS — I10 Essential (primary) hypertension: Secondary | ICD-10-CM

## 2012-08-27 DIAGNOSIS — N289 Disorder of kidney and ureter, unspecified: Secondary | ICD-10-CM

## 2012-08-27 DIAGNOSIS — R809 Proteinuria, unspecified: Secondary | ICD-10-CM | POA: Insufficient documentation

## 2012-08-27 MED ORDER — LANCETS MISC: 1.0000 "application " | Freq: Two times a day (BID) | Status: DC

## 2012-08-27 MED ORDER — METFORMIN HCL ER 500 MG PO TB24: ORAL_TABLET | ORAL | Status: DC

## 2012-08-27 MED ORDER — GLUCOSE BLOOD VI STRP: ORAL_STRIP | Status: DC

## 2012-08-27 NOTE — Patient Instructions (Addendum)
Please take all new medication as prescribed - the metformin as prescribed Please continue all other medications as before You were instructed on how to check your blood sugars, given the OneTouch VerioIQ glucometer, and the prescription for the supplies were sent to your pharmacy (the strips and lancets) Please check your blood sugars twice per day for the first week, and send a message in 1 week to me (Dr [name]) with the results After that, we should be able to cut back to checking once per day, either before breakfast, or before dinner Please let us know by message or calling if your blood sugars are over 200 despite the medication Please go to the LAB in the Basement (turn left off the elevator) for the tests to be done today  - just the Hgba1c (sugar blood test) You will be contacted regarding the referral for: Nephrology Please keep your appointments with your specialists as you have planned - Dr Annamaria Boots Please return in 12 wks, or sooner if needed, with Hgba1c done 2-3 days before

## 2012-08-28 NOTE — Assessment & Plan Note (Signed)
signficant 24 hr of unclear etiology - for renal referral

## 2012-08-28 NOTE — Progress Notes (Signed)
Subjective:    Patient ID: Tara [name], female    DOB: 1951-03-18, 62 y.o.   MRN: [id number]  HPI  Here to f/u, recent calcium, renal fxn improved with steroid tx now planned for prolonged course up to a yr for the sarcoid, but also found to have significant proteinuria - 570 mg on 24 hr urine, and also elevated sugars.   Pt denies polydipsia, polyuria, or low sugar symptoms such as weakness or confusion improved with po intake.  Pt states overall good compliance with meds, trying to follow lower cholesterol, diabetic diet, wt overall stable but little exercise however.   Pt denies chest pain, increased sob or doe, wheezing, orthopnea, PND, increased LE swelling, palpitations, dizziness or syncope.  Pt denies new neurological symptoms such as new headache, or facial or extremity weakness or numbness  No abd pain, n/v, worsening confusion, weakness, recent calcium normal. Cr 1.2.      Past Medical History  Diagnosis Date  . ANKLE PAIN, LEFT 04/01/2008  . ANXIETY 04/17/2007  . COLONIC POLYPS, HX OF 08/01/2007  . CONTUSIONS, MULTIPLE 04/01/2009  . DEPRESSION 04/17/2007  . DIZZINESS 08/01/2007  . DYSPNEA 08/01/2007  . Enlargement of lymph nodes 08/13/2007  . GLUCOSE INTOLERANCE 08/01/2007  . HYPERLIPIDEMIA 08/01/2007  . HYPERSOMNIA 07/28/2008  . HYPERTENSION 04/17/2007  . JOINT EFFUSION, LEFT KNEE 06/02/2010  . Loose body in knee 04/01/2009  . Morbid obesity 04/20/2007  . OTHER DISEASES OF LUNG NOT ELSEWHERE CLASSIFIED 08/01/2007  . Pain in joint, lower leg 04/01/2009  . PERIPHERAL EDEMA 04/21/2009  . Sarcoidosis 09/25/2007  . SHOULDER PAIN, LEFT 04/01/2009  . Impaired glucose tolerance 03/23/2011  . Fibroid   . Oligomenorrhea   . Migraines     "stopped 3-4 yr ago" (07/23/2012)  . 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 Chronic kidney disease    Past Surgical History  Procedure Laterality Date  . Myomectomy  1994  . Fracture surgery  ?02/1997    "left upper arm; put rod in" (07/23/2012)  . Refractive  surgery  08/1998    "both eyes" (07/23/2012)  . Combined mediastinoscopy and bronchoscopy  08/2007  . Gum surgery  2000-?2009    "several ORs; soft tissue graft; took material from roof of mouth" (07/23/2012  . Knee arthroscopy  03/2004; 06/2009    "right; left" Dr. Theda Sers  . Lymph node biopsy  ~ 2009    "for sarcoidosis; don't know exactly which nodes" (07/23/2102)  . Combined mediastinoscopy and bronchoscopy  2009    reports that she has never smoked. She has never used smokeless tobacco. She reports that  drinks alcohol. She reports that she does not use illicit drugs. family history includes Diabetes in her mother; Heart disease (age of onset: 55) in her father; Hypertension in her father, mother, and sister; and Stroke in her mother. Allergies  Allergen Reactions  . Floxin (Ocuflox)   . Fluoxetine Hcl   . Sulfonamide Derivatives    Current Outpatient Prescriptions on File Prior to Visit  Medication Sig Dispense Refill  . amphetamine-dextroamphetamine (ADDERALL) 30 MG tablet Take 30 mg by mouth 2 (two) times daily.      Marland Kitchen aspirin 81 MG tablet Take 81 mg by mouth daily.        Marland Kitchen atenolol (TENORMIN) 50 MG tablet Take 1 tablet (50 mg total) by mouth daily.  90 tablet  3  . clorazepate (TRANXENE) 7.5 MG tablet Take 7.5 mg by mouth QID.       Marland Kitchen desvenlafaxine (  PRISTIQ) 50 MG 24 hr tablet Take 50 mg by mouth daily.      Marland Kitchen diltiazem (CARTIA XT) 240 MG 24 hr capsule Take 1 capsule (240 mg total) by mouth daily.  90 capsule  3  . glucosamine-chondroitin 500-400 MG tablet Take 1 tablet by mouth 3 (three) times daily.      . Multiple Vitamin (MULTIVITAMIN) tablet Take 1 tablet by mouth daily.      . predniSONE (DELTASONE) 20 MG tablet Take 3 tablets (60 mg total) by mouth daily.  90 tablet  1  . valsartan (DIOVAN) 320 MG tablet Take 1 tablet (320 mg total) by mouth daily.  90 tablet  3   No current facility-administered medications on file prior to visit.   Review of Systems  Constitutional:  Negative for unexpected weight change, or unusual diaphoresis  HENT: Negative for tinnitus.   Eyes: Negative for photophobia and visual disturbance.  Respiratory: Negative for choking and stridor.   Gastrointestinal: Negative for vomiting and blood in stool.  Genitourinary: Negative for hematuria and decreased urine volume.  Musculoskeletal: Negative for acute joint swelling Skin: Negative for color change and wound.  Neurological: Negative for tremors and numbness other than noted  Psychiatric/Behavioral: Negative for decreased concentration or  hyperactivity.       Objective:   Physical Exam BP 122/72  Pulse 73  Temp(Src) 97 F (36.1 C) (Oral)  Ht 5\' 6"  ([id number] m)  Wt 239 lb 4 oz (108.523 kg)  BMI 38.63 kg/m2  SpO2 97% VS noted,  Constitutional: Pt appears well-developed and well-nourished.  HENT: Head: NCAT.  Right Ear: External ear normal.  Left Ear: External ear normal.  Eyes: Conjunctivae and EOM are normal. Pupils are equal, round, and reactive to light.  Neck: Normal range of motion. Neck supple.  Cardiovascular: Normal rate and regular rhythm.   Pulmonary/Chest: Effort normal and breath sounds normal.  Abd:  Soft, NT, non-distended, + BS Neurological: Pt is alert. Not confused  Skin: Skin is warm. No erythema.  Psychiatric: Pt behavior is normal.     Assessment & Plan:

## 2012-08-28 NOTE — Progress Notes (Deleted)
Subjective:     Patient ID: Tara [name], female   DOB: 04-03-1951, 62 y.o.   MRN: [id number]  HPI   Review of Systems     Objective:   Physical Exam     Assessment:     ***    Plan:     ***

## 2012-08-28 NOTE — Assessment & Plan Note (Addendum)
Improved, for f/u labs soon, volume stable, BP ok

## 2012-08-28 NOTE — Assessment & Plan Note (Signed)
Mild uncontrolled in obese pt with prior impair glu tol/morbid obesity with normal a1c, will add "sliding scale" metformin relatively low dose, to check cbg's bid (gave glucometer, supplies,and teaching today in office) for 1 wk, then daily after, recent cr in female adequate for metformin use and hopeful no further wt increase due to med in the setting of obesity and planned extended prednisone use, declines DM education for now

## 2012-08-28 NOTE — Assessment & Plan Note (Signed)
stable overall by history and exam, recent data reviewed with pt, and pt to continue medical treatment as before,  to f/u any worsening symptoms or concerns BP Readings from Last 3 Encounters:  08/27/12 122/72  08/22/12 148/92  07/29/12 142/90

## 2012-09-02 ENCOUNTER — Other Ambulatory Visit: Payer: Self-pay | Admitting: Gynecology

## 2012-09-02 DIAGNOSIS — Z1231 Encounter for screening mammogram for malignant neoplasm of breast: Secondary | ICD-10-CM

## 2012-09-10 ENCOUNTER — Encounter: Payer: Self-pay | Admitting: Internal Medicine

## 2012-09-25 ENCOUNTER — Ambulatory Visit (INDEPENDENT_AMBULATORY_CARE_PROVIDER_SITE_OTHER): Payer: Managed Care, Other (non HMO) | Admitting: Internal Medicine

## 2012-09-25 ENCOUNTER — Ambulatory Visit (INDEPENDENT_AMBULATORY_CARE_PROVIDER_SITE_OTHER)
Admission: RE | Admit: 2012-09-25 | Discharge: 2012-09-25 | Disposition: A | Discharge status: Home / Self Care | Payer: Managed Care, Other (non HMO) | Source: Ambulatory Visit | Attending: Internal Medicine | Admitting: Internal Medicine

## 2012-09-25 ENCOUNTER — Encounter: Payer: Self-pay | Admitting: Internal Medicine

## 2012-09-25 VITALS — BP 140/98 | HR 87 | Ht 66.0 in | Wt 246.6 lb

## 2012-09-25 DIAGNOSIS — E139 Other specified diabetes mellitus without complications: Secondary | ICD-10-CM

## 2012-09-25 DIAGNOSIS — E099 Drug or chemical induced diabetes mellitus without complications: Secondary | ICD-10-CM

## 2012-09-25 DIAGNOSIS — D869 Sarcoidosis, unspecified: Secondary | ICD-10-CM

## 2012-09-25 DIAGNOSIS — T380X5A Adverse effect of glucocorticoids and synthetic analogues, initial encounter: Secondary | ICD-10-CM

## 2012-09-25 NOTE — Patient Instructions (Addendum)
Reduce prednisone to 20 mg daily until our next visit  Order- CXR   Dx sarcoid

## 2012-09-25 NOTE — Progress Notes (Signed)
08/22/12- 61 yoF never smoker followed for sarcoid complicated by hypercalcemia, renal insufficency  [facility]-also would like to know about Iron supplement. Dr [name] PCP, [facility]. Last seen here 10/04/08- had been dx'd w/ OSA but disliked CPAP and f/u NPSG 06/25/08 was negative so that issue considered resolved Sarcoid dx'd 2009 - mediastinoscopy and bronchoscopy. Prednisone x 1 year. Now hosp 1/7-1/10/14 for hypercalcemia and renal insufficiency associated with multiple pulmonary nodules of stage II sarcoid. Breast biopsy positive for noncaseating granulomas. Started on prednisone 60 mg daily at that time, anticipating one month to reduce by 5-10 mg increments. She notes that eyes burn and visual acuity is not as sharp, appetite increased on steroids. No night sweats or fever and little cough. ACE 07/23/12  86 (8-52) CT chest 07/23/12 IMPRESSION:  1. While the previously noted mediastinal and bilateral hilar  lymphadenopathy appears to have decreased, there are now  innumerable perilymphatic nodules scattered throughout the lungs  bilaterally, with a definite upper lung predominance. Overall, the  findings are compatible with sarcoidosis (i.e., progression from  stage I to stage II).  Original Report Authenticated By: [name], M.D.  09/25/12- 57 yoF never smoker followed for sarcoid complicated by hypercalcemia, renal insufficency   Dr [name] PCP, Dr Ninetta Lights. FOLLOWS FOR: increased SOB but no wheezing; "just feel winded". She remains on prednisone, started at 60 mg per day on January 10 while in hospital. Now tapering by 10 mg per week and is at 30 mg daily. ACE 08/22/12- down from 86 2 months ago to 26. She says her persistent blinking is because of dry eyes despite use of lubricant from her eye doctor.  ROS-see HPI Constitutional:   No-   weight loss, night sweats, fevers, +chills, no-fatigue, lassitude. HEENT:   No-  headaches, difficulty swallowing, tooth/dental problems, sore  throat,       No-  sneezing, itching, ear ache, nasal congestion, post nasal drip,  CV:  No-   chest pain, orthopnea, PND, swelling in lower extremities, anasarca,                                  dizziness, palpitations Resp: + shortness of breath with exertion or at rest.              No-   productive cough,  No non-productive cough,  No- coughing up of blood.              No-   change in color of mucus.  No- wheezing.   Skin: No-   rash or lesions. GI:  No-   heartburn, indigestion, abdominal pain, nausea, vomiting,  GU:  MS:  No-   joint pain or swelling.   Neuro-     nothing unusual Psych:  No- change in mood or affect. No depression or anxiety.  No memory loss.  OBJ- Physical Exam General- Alert, Oriented, Affect-appropriate, Distress- none acute, overweight Skin- rash-none, lesions- none, excoriation- none Lymphadenopathy- none Head- atraumatic            Eyes- Gross vision intact, PERRLA, conjunctivae clear, frequent blinking            Ears- Hearing, canals-normal            Nose- Clear, no-Septal dev, mucus, polyps, erosion, perforation             Throat- Mallampati II , mucosa clear , drainage- none, tonsils- atrophic  Neck- flexible , trachea midline, no stridor , thyroid nl, carotid no bruit Chest - symmetrical excursion , unlabored           Heart/CV- RRR , no murmur , no gallop  , no rub, nl s1 s2                           - JVD- none , edema+1-2, stasis changes- none, varices- none           Lung- clear to P&A, wheeze- none, cough- none , dullness-none, rub- none           Chest wall-  Abd-  Br/ Gen/ Rectal- Not done, not indicated Extrem- cyanosis- none, clubbing, none, atrophy- none, strength- nl Neuro- blinking, grimacing

## 2012-09-28 NOTE — Assessment & Plan Note (Signed)
Prednisone restarted at 60 mg daily at 07/25/2012. For the last 2 weeks she has been holding at 30 mg daily. Fluid retention noted in her legs. Plan-reduce prednisone to 20 mg daily. Chest x-ray. Elevate legs  But call if edema persists.

## 2012-09-28 NOTE — Assessment & Plan Note (Addendum)
This is being monitored. Expect improvement as the steroid dose is lowered.

## 2012-09-29 ENCOUNTER — Ambulatory Visit
Admission: RE | Admit: 2012-09-29 | Discharge: 2012-09-29 | Disposition: A | Discharge status: Home / Self Care | Payer: Managed Care, Other (non HMO) | Source: Ambulatory Visit | Attending: Gynecology | Admitting: Gynecology

## 2012-09-29 DIAGNOSIS — Z1231 Encounter for screening mammogram for malignant neoplasm of breast: Secondary | ICD-10-CM

## 2012-09-30 LAB — HM MAMMOGRAPHY

## 2012-10-06 ENCOUNTER — Ambulatory Visit (INDEPENDENT_AMBULATORY_CARE_PROVIDER_SITE_OTHER): Payer: Managed Care, Other (non HMO) | Admitting: Gynecology

## 2012-10-06 ENCOUNTER — Encounter: Payer: Self-pay | Admitting: Gynecology

## 2012-10-06 ENCOUNTER — Other Ambulatory Visit (HOSPITAL_COMMUNITY)
Admission: RE | Admit: 2012-10-06 | Discharge: 2012-10-06 | Disposition: A | Discharge status: Home / Self Care | Payer: Managed Care, Other (non HMO) | Source: Ambulatory Visit | Attending: Gynecology | Admitting: Gynecology

## 2012-10-06 VITALS — BP 124/78 | Ht 66.0 in | Wt 240.0 lb

## 2012-10-06 DIAGNOSIS — Z1151 Encounter for screening for human papillomavirus (HPV): Secondary | ICD-10-CM | POA: Insufficient documentation

## 2012-10-06 DIAGNOSIS — Z01419 Encounter for gynecological examination (general) (routine) without abnormal findings: Secondary | ICD-10-CM

## 2012-10-06 DIAGNOSIS — Z780 Asymptomatic menopausal state: Secondary | ICD-10-CM

## 2012-10-06 NOTE — Patient Instructions (Signed)
Followup for bone density as scheduled. Otherwise followup in 1 year for annual exam.

## 2012-10-06 NOTE — Progress Notes (Signed)
Tara [name] Mar 23, 1951 [id number]        61 y.o.  G0P0 for annual exam.  Former patient of Dr. Cherylann Banas. Several issues noted below.  Past medical history,surgical history, medications, allergies, family history and social history were all reviewed and documented in the EPIC chart. ROS:  Was performed and pertinent positives and negatives are included in the history.  Exam: [name] assistant Filed Vitals:   10/06/12 1357  BP: 124/78  Height: 5\' 6"  (1.676 m)  Weight: 240 lb (108.863 kg)   General appearance  Normal Skin grossly normal Head/Neck normal with no cervical or supraclavicular adenopathy thyroid normal Lungs  clear Cardiac RR, without RMG Abdominal  soft, nontender, without masses, organomegaly or hernia Breasts  examined lying and sitting without masses, retractions, discharge or axillary adenopathy. Pelvic  Ext/BUS/vagina  normal with mild atrophic changes  Cervix  normal high in the vaginal vault  Uterus  grossly normal size, midline and mobile nontender   Adnexa  Without gross masses or tenderness    Anus and perineum  normal   Rectovaginal  normal sphincter tone without palpated masses or tenderness.    Assessment/Plan:  62 y.o. G0P0 female for annual exam.   1. Postmenopausal. Doing well without significant hot flashes/sweats or vaginal symptoms such as dryness/dyspareunia. We'll continue to monitor. Patient knows to report any vaginal bleeding or other symptoms. 2. DEXA 2009 normal. Repeat now at the 5 year interval as she is postmenopausal and on chronic steroids for her sarcoidosis. 3. Mammography 09/2012. Continue with annual mammography. SBE monthly. 4. Pap smear 2013. No endocervical cells and no HPV done. Pap with HPV today. No history of abnormal Pap smears. We'll plan less frequent screening interval assuming this Pap returns normal. 5. Colonoscopy 2004. Due this year and she knows to schedule this. 6. Health maintenance. No blood work done as it is all done  through her other 32 office who she actively sees for her medical conditions. Followup for DEXA otherwise annually.    Anastasio Auerbach MD, 2:27 PM 10/06/2012

## 2012-10-07 ENCOUNTER — Encounter: Payer: Self-pay | Admitting: Gynecology

## 2012-10-07 ENCOUNTER — Other Ambulatory Visit: Payer: Self-pay | Admitting: Gynecology

## 2012-10-07 LAB — URINALYSIS W MICROSCOPIC + REFLEX CULTURE
Bacteria, UA: NONE SEEN
Bilirubin Urine: NEGATIVE
Crystals: NONE SEEN
Glucose, UA: 100 mg/dL — AB
Ketones, ur: NEGATIVE mg/dL
Protein, ur: NEGATIVE mg/dL
Squamous Epithelial / LPF: NONE SEEN

## 2012-10-07 MED ORDER — NITROFURANTOIN MONOHYD MACRO 100 MG PO CAPS: 100.0000 mg | ORAL_CAPSULE | Freq: Two times a day (BID) | ORAL | Status: DC

## 2012-10-09 LAB — URINE CULTURE

## 2012-10-16 ENCOUNTER — Ambulatory Visit (INDEPENDENT_AMBULATORY_CARE_PROVIDER_SITE_OTHER): Payer: Managed Care, Other (non HMO)

## 2012-10-16 DIAGNOSIS — Z780 Asymptomatic menopausal state: Secondary | ICD-10-CM

## 2012-10-16 DIAGNOSIS — D860 Sarcoidosis of lung: Secondary | ICD-10-CM

## 2012-10-16 DIAGNOSIS — Z13820 Encounter for screening for osteoporosis: Secondary | ICD-10-CM

## 2012-11-03 ENCOUNTER — Other Ambulatory Visit: Payer: Managed Care, Other (non HMO)

## 2012-11-03 ENCOUNTER — Encounter: Payer: Self-pay | Admitting: Internal Medicine

## 2012-11-03 ENCOUNTER — Encounter: Payer: Self-pay | Admitting: *Deleted

## 2012-11-03 ENCOUNTER — Ambulatory Visit (INDEPENDENT_AMBULATORY_CARE_PROVIDER_SITE_OTHER): Payer: Managed Care, Other (non HMO) | Admitting: Internal Medicine

## 2012-11-03 VITALS — BP 140/86 | HR 71 | Ht 66.0 in | Wt 251.0 lb

## 2012-11-03 DIAGNOSIS — D869 Sarcoidosis, unspecified: Secondary | ICD-10-CM

## 2012-11-03 NOTE — Patient Instructions (Addendum)
Reduce prednisone now to 10 mg daily ( 1/2 x 20 mg tab)  If you run out before next visit, call and we will refill with 10 mg tabs  Order- ACE level     Dx Sarcoid

## 2012-11-03 NOTE — Progress Notes (Signed)
08/22/12- 61 yoF never smoker followed for sarcoid complicated by hypercalcemia, renal insufficency  Post Hospital-also would like to know about Iron supplement. Dr [name] PCP, Dr [facility]. Last seen here 10/04/08- had been dx'd w/ OSA but disliked CPAP and f/u NPSG 06/25/08 was negative so that issue considered resolved Sarcoid dx'd 2009 - mediastinoscopy and bronchoscopy. Prednisone x 1 year. Now hosp 1/7-1/10/14 for hypercalcemia and renal insufficiency associated with multiple pulmonary nodules of stage II sarcoid. Breast biopsy positive for noncaseating granulomas. Started on prednisone 60 mg daily at that time, anticipating one month to reduce by 5-10 mg increments. She notes that eyes burn and visual acuity is not as sharp, appetite increased on steroids. No night sweats or fever and little cough. ACE 07/23/12  86 (8-52) CT chest 07/23/12 IMPRESSION:  1. While the previously noted mediastinal and bilateral hilar  lymphadenopathy appears to have decreased, there are now  innumerable perilymphatic nodules scattered throughout the lungs  bilaterally, with a definite upper lung predominance. Overall, the  findings are compatible with sarcoidosis (i.e., progression from  stage I to stage II).  Original Report Authenticated By: [name], M.D.  09/25/12- 41 yoF never smoker followed for sarcoid complicated by hypercalcemia, renal insufficency   Dr [name] PCP, Dr Ninetta Lights. FOLLOWS FOR: increased SOB but no wheezing; "just feel winded". She remains on prednisone, started at 60 mg per day on January 10 while in hospital. Now tapering by 10 mg per week and is at 30 mg daily. ACE 08/22/12- down from 86 2 months ago to 26. She says her persistent blinking is because of dry eyes despite use of lubricant from her eye doctor.  11/03/12- 61 yoF never smoker followed for sarcoid complicated by hypercalcemia, renal insufficency   Dr [name] PCP, Dr Ninetta Lights. FOLLOWS FOR: Breathing is unchanged. Reports SOB  with exertion, chest pain and coughing from time to time. Denies chest tightness or wheezing. She did a multiple sclerosis fundraiser walk, stopping on hills. This is an unusual amount of exercise for her. Denies seasonal rhinitis complaints. Continues prednisone 20 mg daily for sarcoid. ACE 08/22/12- 26  CXR 09/25/12 Comparison: Chest x-ray of 04/21/2009 and CT of 07/23/2012.  Findings: The heart is borderline. The hila and mediastinum appear  normal. Right paratracheal thickening on the preceding chest x-ray  is less obvious. The lungs appear clear. Changes of sarcoid seen  on the recent CT examination are not clearly evident on the  radiographs because of the small size of the perilymphatic nodules  noted. No airspace disease. No effusions or pneumothoraces. No  acute bony changes.  IMPRESSION:  No acute findings in the chest.  Original Report Authenticated By: Vallery Ridge, M.D.   ROS-see HPI Constitutional:   No-   weight loss, night sweats, fevers, chills, no-fatigue, lassitude. HEENT:   No-  headaches, difficulty swallowing, tooth/dental problems, sore throat,       No-  sneezing, itching, ear ache, nasal congestion, post nasal drip,  CV:  No-   chest pain, orthopnea, PND, swelling in lower extremities, anasarca,                                  dizziness, palpitations Resp: + shortness of breath with exertion or at rest.              No-   productive cough,  No non-productive cough,  No- coughing up of blood.  No- wheezing.   Skin: No-   rash or lesions. GI:  No-   heartburn, indigestion, abdominal pain, nausea, vomiting,  GU:  MS:  No-   joint pain or swelling.   Neuro-     nothing unusual Psych:  No- change in mood or affect. No depression or anxiety.  No memory loss.  OBJ- Physical Exam General- Alert, Oriented, Affect-appropriate, Distress- none acute, overweight Skin- rash-none, lesions- none, excoriation- none Lymphadenopathy- none Head- atraumatic             Eyes- Gross vision intact, PERRLA, conjunctivae clear, frequent blinking            Ears- Hearing, canals-normal            Nose- Clear, no-Septal dev, mucus, polyps, erosion, perforation             Throat- Mallampati II , mucosa clear , drainage- none, tonsils- atrophic Neck- flexible , trachea midline, no stridor , thyroid nl, carotid no bruit Chest - symmetrical excursion , unlabored           Heart/CV- RRR , no murmur , no gallop  , no rub, nl s1 s2                           - JVD- none , edema+1, stasis changes- none, varices- none           Lung- clear to P&A, wheeze- none, cough- none , dullness-none, rub- none           Chest wall-  Abd-  Br/ Gen/ Rectal- Not done, not indicated Extrem- cyanosis- none, clubbing, none, atrophy- none, strength- nl Neuro- +blinking, grimacing

## 2012-11-07 NOTE — Progress Notes (Signed)
Quick Note:  ATC patient at given numbers-no answer and unable to leave VM; will need to try again later. ______

## 2012-11-09 NOTE — Assessment & Plan Note (Signed)
She has been on 20 mg daily prednisone and is clinically much improved. Steroid side effects have been discussed again. Plan-reduce prednisone to 10 mg daily (1/2x20 mg)

## 2012-11-12 NOTE — Progress Notes (Signed)
Quick Note:  LMTCB at work number ______

## 2012-11-13 NOTE — Progress Notes (Signed)
Quick Note:  Pt aware of results. ______ 

## 2012-11-26 ENCOUNTER — Encounter: Payer: Self-pay | Admitting: Internal Medicine

## 2012-11-26 ENCOUNTER — Other Ambulatory Visit (INDEPENDENT_AMBULATORY_CARE_PROVIDER_SITE_OTHER): Payer: Managed Care, Other (non HMO)

## 2012-11-26 ENCOUNTER — Ambulatory Visit (INDEPENDENT_AMBULATORY_CARE_PROVIDER_SITE_OTHER): Payer: Managed Care, Other (non HMO) | Admitting: Internal Medicine

## 2012-11-26 VITALS — BP 120/80 | HR 69 | Temp 98.1°F | Ht 66.0 in | Wt 247.2 lb

## 2012-11-26 DIAGNOSIS — E139 Other specified diabetes mellitus without complications: Secondary | ICD-10-CM

## 2012-11-26 DIAGNOSIS — E099 Drug or chemical induced diabetes mellitus without complications: Secondary | ICD-10-CM

## 2012-11-26 DIAGNOSIS — R609 Edema, unspecified: Secondary | ICD-10-CM

## 2012-11-26 DIAGNOSIS — I10 Essential (primary) hypertension: Secondary | ICD-10-CM

## 2012-11-26 DIAGNOSIS — E785 Hyperlipidemia, unspecified: Secondary | ICD-10-CM

## 2012-11-26 DIAGNOSIS — T380X5A Adverse effect of glucocorticoids and synthetic analogues, initial encounter: Secondary | ICD-10-CM

## 2012-11-26 LAB — BASIC METABOLIC PANEL
Calcium: 9.5 mg/dL (ref 8.4–10.5)
GFR: 47.79 mL/min — ABNORMAL LOW (ref >60.00)
Potassium: 3.8 mEq/L (ref 3.5–5.1)
Sodium: 138 mEq/L (ref 135–145)

## 2012-11-26 LAB — LIPID PANEL
HDL: 54.7 mg/dL (ref >39.00)
Total CHOL/HDL Ratio: 3
Triglycerides: 221 mg/dL — ABNORMAL HIGH (ref 0.0–149.0)

## 2012-11-26 LAB — HEPATIC FUNCTION PANEL
Alkaline Phosphatase: 90 U/L (ref 39–117)
Bilirubin, Direct: 0 mg/dL (ref 0.0–0.3)

## 2012-11-26 NOTE — Assessment & Plan Note (Signed)
stable overall by history and exam, recent data reviewed with pt, and pt to continue medical treatment as before,  to f/u any worsening symptoms or concerns BP Readings from Last 3 Encounters:  11/26/12 120/80  11/03/12 140/86  10/06/12 124/78

## 2012-11-26 NOTE — Assessment & Plan Note (Signed)
None today, stable overall by history and exam, recent data reviewed with pt, and pt to continue medical treatment as before,  to f/u any worsening symptoms or concerns Lab Results  Component Value Date   WBC 6.4 07/23/2012   HGB 11.5* 07/23/2012   HCT 33.8* 07/23/2012   PLT 183 07/23/2012   GLUCOSE 147* 11/26/2012   CHOL 177 11/26/2012   TRIG 221.0* 11/26/2012   HDL 54.70 11/26/2012   LDLDIRECT 59.8 11/26/2012   LDLCALC 61 07/22/2012   ALT 29 11/26/2012   AST 23 11/26/2012   NA 138 11/26/2012   K 3.8 11/26/2012   CL 101 11/26/2012   CREATININE 1.2 11/26/2012   BUN 17 11/26/2012   CO2 31 11/26/2012   TSH 3.379 07/22/2012   INR 0.9 09/04/2007   HGBA1C 6.5 11/26/2012

## 2012-11-26 NOTE — Progress Notes (Signed)
Subjective:    Patient ID: Tara [name], female    DOB: Nov 17, 1950, 62 y.o.   MRN: [id number]  HPI Here to f/u; overall doing ok,  Pt denies chest pain, increased sob or doe, wheezing, orthopnea, PND, increased LE swelling, palpitations, dizziness or syncope.  Pt denies polydipsia, polyuria, or low sugar symptoms such as weakness or confusion improved with po intake.  Pt denies new neurological symptoms such as new headache, or facial or extremity weakness or numbness.   Pt states overall good compliance with meds, has been trying to follow lower cholesterol, diabetic diet, with wt overall stable,  but little exercise however. On prednisone tapering slowly, recent ACE normal. CBG's in low 100's Past Medical History  Diagnosis Date  . ANKLE PAIN, LEFT 04/01/2008  . ANXIETY 04/17/2007  . COLONIC POLYPS, HX OF 08/01/2007  . CONTUSIONS, MULTIPLE 04/01/2009  . DEPRESSION 04/17/2007  . DIZZINESS 08/01/2007  . DYSPNEA 08/01/2007  . Enlargement of lymph nodes 08/13/2007  . GLUCOSE INTOLERANCE 08/01/2007  . HYPERLIPIDEMIA 08/01/2007  . HYPERSOMNIA 07/28/2008  . HYPERTENSION 04/17/2007  . JOINT EFFUSION, LEFT KNEE 06/02/2010  . Loose body in knee 04/01/2009  . Morbid obesity 04/20/2007  . OTHER DISEASES OF LUNG NOT ELSEWHERE CLASSIFIED 08/01/2007  . Pain in joint, lower leg 04/01/2009  . PERIPHERAL EDEMA 04/21/2009  . Sarcoidosis 09/25/2007  . SHOULDER PAIN, LEFT 04/01/2009  . Impaired glucose tolerance 03/23/2011  . Oligomenorrhea   . Migraines     "stopped 3-4 yr ago" (07/23/2012)  . 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 Chronic kidney disease    Past Surgical History  Procedure Laterality Date  . Myomectomy  1994  . Fracture surgery  ?02/1997    "left upper arm; put rod in" (07/23/2012)  . Refractive surgery  08/1998    "both eyes" (07/23/2012)  . Combined mediastinoscopy and bronchoscopy  08/2007  . Gum surgery  2000-?2009    "several ORs; soft tissue graft; took material from roof of mouth" (07/23/2012  .  Knee arthroscopy  03/2004; 06/2009    "right; left" Dr. Theda Sers  . Lymph node biopsy  ~ 2009    "for sarcoidosis; don't know exactly which nodes" (07/23/2102)  . Combined mediastinoscopy and bronchoscopy  2009    reports that she has never smoked. She has never used smokeless tobacco. She reports that  drinks alcohol. She reports that she does not use illicit drugs. family history includes Asthma in her sister; Diabetes in her mother; Heart disease (age of onset: 68) in her father; Hypertension in her father, mother, and sister; and Stroke in her mother. Allergies  Allergen Reactions  . Floxin (Ocuflox)   . Fluoxetine Hcl   . Sulfonamide Derivatives    Current Outpatient Prescriptions on File Prior to Visit  Medication Sig Dispense Refill  . amphetamine-dextroamphetamine (ADDERALL) 30 MG tablet Take 30 mg by mouth 2 (two) times daily.      Marland Kitchen aspirin 81 MG tablet Take 81 mg by mouth daily.        Marland Kitchen atenolol (TENORMIN) 50 MG tablet Take 1 tablet (50 mg total) by mouth daily.  90 tablet  3  . clorazepate (TRANXENE) 7.5 MG tablet Take 7.5 mg by mouth QID.       Marland Kitchen desvenlafaxine (PRISTIQ) 50 MG 24 hr tablet Take 50 mg by mouth daily.      Marland Kitchen diltiazem (CARTIA XT) 240 MG 24 hr capsule Take 1 capsule (240 mg total) by mouth daily.  90 capsule  3  .  ferrous sulfate 325 (65 FE) MG tablet Take 325 mg by mouth daily with breakfast.      . glucosamine-chondroitin 500-400 MG tablet Take 1 tablet by mouth 3 (three) times daily.      Marland Kitchen glucose blood (ONETOUCH VERIO) test strip Use as instructed twice per day  250.02  200 each  12  . Lancets MISC 1 application by Does not apply route 2 (two) times daily.  200 each  5  . metFORMIN (GLUCOPHAGE XR) 500 MG 24 hr tablet 1 tab by mouth every AM, except for 2 tabs in the AM for CBG > 180  180 tablet  3  . Multiple Vitamin (MULTIVITAMIN) tablet Take 1 tablet by mouth daily.      . predniSONE (DELTASONE) 20 MG tablet Take 20 mg by mouth daily.       . valsartan  (DIOVAN) 320 MG tablet Take 1 tablet (320 mg total) by mouth daily.  90 tablet  3   No current facility-administered medications on file prior to visit.   Review of Systems  Constitutional: Negative for unexpected weight change, or unusual diaphoresis  HENT: Negative for tinnitus.   Eyes: Negative for photophobia and visual disturbance.  Respiratory: Negative for choking and stridor.   Gastrointestinal: Negative for vomiting and blood in stool.  Genitourinary: Negative for hematuria and decreased urine volume.  Musculoskeletal: Negative for acute joint swelling Skin: Negative for color change and wound.  Neurological: Negative for tremors and numbness other than noted  Psychiatric/Behavioral: Negative for decreased concentration or  hyperactivity.       Objective:   Physical Exam BP 120/80  Pulse 69  Temp(Src) 98.1 F (36.7 C) (Oral)  Ht 5\' 6"  (1.676 m)  Wt 247 lb 4 oz (112.152 kg)  BMI 39.93 kg/m2  SpO2 97% VS noted,  Constitutional: Pt appears well-developed and well-nourished.  HENT: Head: NCAT.  Right Ear: External ear normal.  Left Ear: External ear normal.  Eyes: Conjunctivae and EOM are normal. Pupils are equal, round, and reactive to light.  Neck: Normal range of motion. Neck supple.  Cardiovascular: Normal rate and regular rhythm.   Pulmonary/Chest: Effort normal and breath sounds normal.  Abd:  Soft, NT, non-distended, + BS Neurological: Pt is alert. Not confused  Skin: Skin is warm. No erythema. No LE edema Psychiatric: Pt behavior is normal. Thought content normal.     Assessment & Plan:

## 2012-11-26 NOTE — Patient Instructions (Signed)
Please continue all other medications as before, and refills have been done if requested. Please have the pharmacy call with any other refills you may need. Please continue to monitor your blood sugars as you have been doing Please go to the LAB in the Basement (turn left off the elevator) for the tests to be done today You will be contacted by phone if any changes need to be made immediately.  Otherwise, you will receive a letter about your results with an explanation, but please check with MyChart first. Thank you for enrolling in [facility]. Please follow the instructions below to securely access your online medical record. MyChart allows you to send messages to your doctor, view your test results, renew your prescriptions, schedule appointments, and more. Please keep your appointments with your specialists as you have planned Please return in 3 months, or sooner if needed

## 2012-11-26 NOTE — Assessment & Plan Note (Signed)
stable overall by history and exam, recent data reviewed with pt, and pt to continue medical treatment as before,  to f/u any worsening symptoms or concerns Lab Results  Component Value Date   LDLCALC 61 07/22/2012

## 2012-11-26 NOTE — Assessment & Plan Note (Signed)
stable overall by history and exam, recent data reviewed with pt, and pt to continue medical treatment as before,  to f/u any worsening symptoms or concerns Lab Results  Component Value Date   HGBA1C 6.5 11/26/2012

## 2012-12-15 ENCOUNTER — Telehealth: Payer: Self-pay

## 2012-12-15 ENCOUNTER — Other Ambulatory Visit: Payer: Self-pay | Admitting: Internal Medicine

## 2012-12-15 MED ORDER — ATENOLOL 50 MG PO TABS: 50.0000 mg | ORAL_TABLET | Freq: Every day | ORAL | Status: DC

## 2012-12-15 NOTE — Telephone Encounter (Signed)
refill 

## 2012-12-19 ENCOUNTER — Ambulatory Visit (INDEPENDENT_AMBULATORY_CARE_PROVIDER_SITE_OTHER): Payer: Managed Care, Other (non HMO) | Admitting: Internal Medicine

## 2012-12-19 ENCOUNTER — Encounter: Payer: Self-pay | Admitting: Internal Medicine

## 2012-12-19 VITALS — BP 138/80 | HR 74 | Ht 66.0 in | Wt 250.2 lb

## 2012-12-19 DIAGNOSIS — D869 Sarcoidosis, unspecified: Secondary | ICD-10-CM

## 2012-12-19 NOTE — Progress Notes (Signed)
08/22/12- 61 yoF never smoker followed for sarcoid complicated by hypercalcemia, renal insufficency  [facility]-also would like to know about Iron supplement. Dr [name] PCP, Dr [facility]. Last seen here 10/04/08- had been dx'd w/ OSA but disliked CPAP and f/u NPSG 06/25/08 was negative so that issue considered resolved Sarcoid dx'd 2009 - mediastinoscopy and bronchoscopy. Prednisone x 1 year. Now hosp 1/7-1/10/14 for hypercalcemia and renal insufficiency associated with multiple pulmonary nodules of stage II sarcoid. Breast biopsy positive for noncaseating granulomas. Started on prednisone 60 mg daily at that time, anticipating one month to reduce by 5-10 mg increments. She notes that eyes burn and visual acuity is not as sharp, appetite increased on steroids. No night sweats or fever and little cough. ACE 07/23/12  86 (8-52) CT chest 07/23/12 IMPRESSION:  1. While the previously noted mediastinal and bilateral hilar  lymphadenopathy appears to have decreased, there are now  innumerable perilymphatic nodules scattered throughout the lungs  bilaterally, with a definite upper lung predominance. Overall, the  findings are compatible with sarcoidosis (i.e., progression from  stage I to stage II).  Original Report Authenticated By: [name], M.D.  09/25/12- 73 yoF never smoker followed for sarcoid complicated by hypercalcemia, renal insufficency   Dr [name] PCP, Dr Ninetta Lights. FOLLOWS FOR: increased SOB but no wheezing; "just feel winded". She remains on prednisone, started at 60 mg per day on January 10 while in hospital. Now tapering by 10 mg per week and is at 30 mg daily. ACE 08/22/12- down from 86 2 months ago to 26. She says her persistent blinking is because of dry eyes despite use of lubricant from her eye doctor.  11/03/12- 61 yoF never smoker followed for sarcoid complicated by hypercalcemia, renal insufficency   Dr [name] PCP, Dr Ninetta Lights. FOLLOWS FOR: Breathing is unchanged. Reports SOB  with exertion, chest pain and coughing from time to time. Denies chest tightness or wheezing. She did a multiple sclerosis fundraiser walk, stopping on hills. This is an unusual amount of exercise for her. Denies seasonal rhinitis complaints. Continues prednisone 20 mg daily for sarcoid. ACE 08/22/12- 26  CXR 09/25/12 Comparison: Chest x-ray of 04/21/2009 and CT of 07/23/2012.  Findings: The heart is borderline. The hila and mediastinum appear  normal. Right paratracheal thickening on the preceding chest x-ray  is less obvious. The lungs appear clear. Changes of sarcoid seen  on the recent CT examination are not clearly evident on the  radiographs because of the small size of the perilymphatic nodules  noted. No airspace disease. No effusions or pneumothoraces. No  acute bony changes.  IMPRESSION:  No acute findings in the chest.  Original Report Authenticated By: Vallery Ridge, M.D.  12/19/12- 51 yoF never smoker followed for sarcoid complicated by hypercalcemia, renal insufficency   Dr [name] PCP, Dr Ninetta Lights.ROS-see HPI FOLLOWS FOR: still notices SOB with activity Occasionally sore upper sternum, dyspnea with exertion but denies cough more swollen glands. Has been on prednisone 10 mg daily since last visit. ACE 11/03/12- 30 (8-52)   Glucose elevated on steroids CXR 09/25/12 IMPRESSION:  No acute findings in the chest.  Original Report Authenticated By: Vallery Ridge, M.D.   Constitutional:   No-   weight loss, night sweats, fevers, chills, no-fatigue, lassitude. HEENT:   No-  headaches, difficulty swallowing, tooth/dental problems, sore throat,       No-  sneezing, itching, ear ache, nasal congestion, post nasal drip,  CV:  +atypical chest pain, no-orthopnea, PND, swelling in lower  extremities, anasarca,                                  dizziness, palpitations Resp: + shortness of breath with exertion or at rest.              No-   productive cough,  No non-productive cough,  No-  coughing up of blood.              No- wheezing.   Skin: No-   rash or lesions. GI:  No-   heartburn, indigestion, abdominal pain, nausea, vomiting,  GU:  MS:  No-   joint pain or swelling.   Neuro-     nothing unusual Psych:  No- change in mood or affect. No depression or anxiety.  No memory loss.  OBJ- Physical Exam General- Alert, Oriented, Affect-appropriate, Distress- none acute, overweight Skin- rash-none, lesions- none, excoriation- none Lymphadenopathy- none Head- atraumatic            Eyes- Gross vision intact, PERRLA, conjunctivae clear, frequent blinking            Ears- Hearing, canals-normal            Nose- Clear, no-Septal dev, mucus, polyps, erosion, perforation             Throat- Mallampati II , mucosa clear , drainage- none, tonsils- atrophic Neck- flexible , trachea midline, no stridor , thyroid nl, carotid no bruit Chest - symmetrical excursion , unlabored           Heart/CV- RRR , no murmur , no gallop  , no rub, nl s1 s2                           - JVD- none , edema+1, stasis changes- none, varices- none           Lung- clear to P&A, wheeze- none, cough- none , dullness-none, rub- none           Chest wall-  Abd-  Br/ Gen/ Rectal- Not done, not indicated Extrem- cyanosis- none, clubbing, none, atrophy- none, strength- nl Neuro- +blinking, grimacing, ? Tics or frontal lobe

## 2012-12-19 NOTE — Patient Instructions (Addendum)
We can reduce prednisone to 1/2 x 20 mg = 10 mg EVERY OTHER DAY

## 2013-01-01 ENCOUNTER — Encounter: Payer: Self-pay | Admitting: Internal Medicine

## 2013-01-01 NOTE — Assessment & Plan Note (Addendum)
ACE level is within normal range. Not coughing. We discussed reduction of prednisone to 10 mg every other day. Steroid talk

## 2013-01-03 ENCOUNTER — Ambulatory Visit (INDEPENDENT_AMBULATORY_CARE_PROVIDER_SITE_OTHER): Payer: Managed Care, Other (non HMO) | Admitting: Emergency Medicine

## 2013-01-03 VITALS — BP 140/82 | HR 78 | Temp 97.7°F | Resp 16 | Ht 67.0 in | Wt 251.2 lb

## 2013-01-03 DIAGNOSIS — L255 Unspecified contact dermatitis due to plants, except food: Secondary | ICD-10-CM

## 2013-01-03 DIAGNOSIS — L259 Unspecified contact dermatitis, unspecified cause: Secondary | ICD-10-CM

## 2013-01-03 MED ORDER — OLOPATADINE HCL 0.1 % OP SOLN: 1.0000 [drp] | Freq: Two times a day (BID) | OPHTHALMIC | Status: DC

## 2013-01-03 MED ORDER — HYDROCORTISONE 1 % EX CREA: TOPICAL_CREAM | Freq: Two times a day (BID) | CUTANEOUS | Status: DC

## 2013-01-03 NOTE — Patient Instructions (Addendum)

## 2013-01-03 NOTE — Progress Notes (Signed)
[facility] [address], [place] [address] [phone]  Date:  01/03/2013   Name:  Tara [name]   DOB:  1951-07-15   MRN:  [id number]  PCP:  Cathlean Cower, MD    Chief Complaint: Rash and Toe Pain   History of Present Illness:  Tara [name] is a 62 y.o. very pleasant female patient who presents with the following:  Has a week long duration rash under her left eye that is pruritic.  No drainage or discharge,  No gluing or redness of eye.  No FB sensation.  Has an avulsed nail dangling by a thread.  No improvement with over the counter medications or other home remedies. Denies other complaint or health concern today.   Patient Active Problem List   Diagnosis Date Noted  . Asymptomatic proteinuria 08/27/2012  . Hypercalcemia 07/23/2012  . Renal insufficiency 07/23/2012  . Fibroid   . Oligomenorrhea   . 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 Preventative health care 03/23/2011  . JOINT EFFUSION, LEFT KNEE 06/02/2010  . PERIPHERAL EDEMA 04/21/2009  . Pain in joint, lower leg 04/01/2009  . HYPERSOMNIA 07/28/2008  . Sarcoidosis 09/25/2007  . HYPERLIPIDEMIA 08/01/2007  . DIZZINESS 08/01/2007  . COLONIC POLYPS, HX OF 08/01/2007  . Morbid obesity 04/20/2007  . ANXIETY 04/17/2007  . DEPRESSION 04/17/2007  . HYPERTENSION 04/17/2007  . MIGRAINES, HX OF 04/17/2007    Past Medical History  Diagnosis Date  . ANKLE PAIN, LEFT 04/01/2008  . ANXIETY 04/17/2007  . COLONIC POLYPS, HX OF 08/01/2007  . CONTUSIONS, MULTIPLE 04/01/2009  . DEPRESSION 04/17/2007  . DIZZINESS 08/01/2007  . DYSPNEA 08/01/2007  . Enlargement of lymph nodes 08/13/2007  . GLUCOSE INTOLERANCE 08/01/2007  . HYPERLIPIDEMIA 08/01/2007  . HYPERSOMNIA 07/28/2008  . HYPERTENSION 04/17/2007  . JOINT EFFUSION, LEFT KNEE 06/02/2010  . Loose body in knee 04/01/2009  . Morbid obesity 04/20/2007  . OTHER DISEASES OF LUNG NOT ELSEWHERE CLASSIFIED 08/01/2007  . Pain in joint, lower leg 04/01/2009  .  PERIPHERAL EDEMA 04/21/2009  . Sarcoidosis 09/25/2007  . SHOULDER PAIN, LEFT 04/01/2009  . Impaired glucose tolerance 03/23/2011  . Oligomenorrhea   . Migraines     "stopped 3-4 yr ago" (07/23/2012)  . 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 Chronic kidney disease     Past Surgical History  Procedure Laterality Date  . Myomectomy  1994  . Fracture surgery  ?02/1997    "left upper arm; put rod in" (07/23/2012)  . Refractive surgery  08/1998    "both eyes" (07/23/2012)  . Combined mediastinoscopy and bronchoscopy  08/2007  . Gum surgery  2000-?2009    "several ORs; soft tissue graft; took material from roof of mouth" (07/23/2012  . Knee arthroscopy  03/2004; 06/2009    "right; left" Dr. Theda Sers  . Lymph node biopsy  ~ 2009    "for sarcoidosis; don't know exactly which nodes" (07/23/2102)  . Combined mediastinoscopy and bronchoscopy  2009    History  Substance Use Topics  . Smoking status: Never Smoker   . Smokeless tobacco: Never Used  . Alcohol Use: Yes     Comment: rare    Family History  Problem Relation Age of Onset  . Heart disease Father 19  . Hypertension Father   . Diabetes Mother   . Hypertension Mother   . Stroke Mother   . Hypertension Sister   . Asthma Sister     Allergies  Allergen Reactions  . Floxin (Ocuflox)   .  Fluoxetine Hcl   . Sulfonamide Derivatives     Medication list has been reviewed and updated.  Current Outpatient Prescriptions on File Prior to Visit  Medication Sig Dispense Refill  . amphetamine-dextroamphetamine (ADDERALL) 30 MG tablet Take 30 mg by mouth 2 (two) times daily.      Marland Kitchen aspirin 81 MG tablet Take 81 mg by mouth daily.        Marland Kitchen atenolol (TENORMIN) 50 MG tablet TAKE 1 TABLET BY MOUTH EVERY NIGHT AT BEDTIME  90 tablet  3  . clorazepate (TRANXENE) 7.5 MG tablet Take 7.5 mg by mouth QID.       Marland Kitchen desvenlafaxine (PRISTIQ) 50 MG 24 hr tablet Take 50 mg by mouth daily.      Marland Kitchen diltiazem (CARTIA XT) 240 MG 24 hr capsule Take 1 capsule (240 mg  total) by mouth daily.  90 capsule  3  . ferrous sulfate 325 (65 FE) MG tablet Take 325 mg by mouth daily with breakfast.      . furosemide (LASIX) 20 MG tablet Take 20 mg by mouth daily.      Marland Kitchen glucosamine-chondroitin 500-400 MG tablet Take 1 tablet by mouth 3 (three) times daily.      Marland Kitchen glucose blood (ONETOUCH VERIO) test strip Use as instructed twice per day  250.02  200 each  12  . Lancets MISC 1 application by Does not apply route 2 (two) times daily.  200 each  5  . metFORMIN (GLUCOPHAGE XR) 500 MG 24 hr tablet 1 tab by mouth every AM, except for 2 tabs in the AM for CBG > 180  180 tablet  3  . Multiple Vitamin (MULTIVITAMIN) tablet Take 1 tablet by mouth daily.      . predniSONE (DELTASONE) 20 MG tablet Take 10 mg by mouth daily.       . valsartan (DIOVAN) 320 MG tablet Take 1 tablet (320 mg total) by mouth daily.  90 tablet  3   No current facility-administered medications on file prior to visit.    Review of Systems:  As per HPI, otherwise negative.    Physical Examination: Filed Vitals:   01/03/13 1435  BP: 140/82  Pulse: 78  Temp: 97.7 F (36.5 C)  Resp: 16   Filed Vitals:   01/03/13 1435  Height: 5\' 7"  (1.702 m)  Weight: 251 lb 3.2 oz (113.944 kg)   Body mass index is 39.33 kg/(m^2). Ideal Body Weight: Weight in (lb) to have BMI = 25: 159.3   GEN: WDWN, NAD, Non-toxic, Alert & Oriented x 3 HEENT: Atraumatic, Normocephalic. PRRERLA EOMI  No conjunctival injection or marginal exudate Ears and Nose: No external deformity. EXTR: No clubbing/cyanosis/edema NEURO: Normal gait.  PSYCH: Normally interactive. Conversant. Not depressed or anxious appearing.  Calm demeanor.  Skin: erythematous rash on left cheek below left eyelid.    Assessment and Plan: Contact dermatitis Hydrocortisone 1% patanol   Signed,  Ellison Carwin, MD

## 2013-01-28 ENCOUNTER — Ambulatory Visit: Payer: Managed Care, Other (non HMO) | Admitting: Internal Medicine

## 2013-02-06 ENCOUNTER — Ambulatory Visit (INDEPENDENT_AMBULATORY_CARE_PROVIDER_SITE_OTHER): Payer: Managed Care, Other (non HMO) | Admitting: Internal Medicine

## 2013-02-06 ENCOUNTER — Encounter: Payer: Self-pay | Admitting: Internal Medicine

## 2013-02-06 ENCOUNTER — Other Ambulatory Visit: Payer: Managed Care, Other (non HMO)

## 2013-02-06 ENCOUNTER — Ambulatory Visit (INDEPENDENT_AMBULATORY_CARE_PROVIDER_SITE_OTHER)
Admission: RE | Admit: 2013-02-06 | Discharge: 2013-02-06 | Disposition: A | Discharge status: Home / Self Care | Payer: Managed Care, Other (non HMO) | Source: Ambulatory Visit | Attending: Internal Medicine | Admitting: Internal Medicine

## 2013-02-06 VITALS — BP 140/80 | HR 66 | Ht 66.0 in | Wt 247.6 lb

## 2013-02-06 DIAGNOSIS — D869 Sarcoidosis, unspecified: Secondary | ICD-10-CM

## 2013-02-06 MED ORDER — PREDNISONE 20 MG PO TABS: 10.0000 mg | ORAL_TABLET | ORAL | Status: DC

## 2013-02-06 NOTE — Progress Notes (Signed)
08/22/12- 61 yoF never smoker followed for sarcoid complicated by hypercalcemia, renal insufficency  [facility]-also would like to know about Iron supplement. Dr [name] PCP, Dr [facility]. Last seen here 10/04/08- had been dx'd w/ OSA but disliked CPAP and f/u NPSG 06/25/08 was negative so that issue considered resolved Sarcoid dx'd 2009 - mediastinoscopy and bronchoscopy. Prednisone x 1 year. Now hosp 1/7-1/10/14 for hypercalcemia and renal insufficiency associated with multiple pulmonary nodules of stage II sarcoid. Breast biopsy positive for noncaseating granulomas. Started on prednisone 60 mg daily at that time, anticipating one month to reduce by 5-10 mg increments. She notes that eyes burn and visual acuity is not as sharp, appetite increased on steroids. No night sweats or fever and little cough. ACE 07/23/12  86 (8-52) CT chest 07/23/12 IMPRESSION:  1. While the previously noted mediastinal and bilateral hilar  lymphadenopathy appears to have decreased, there are now  innumerable perilymphatic nodules scattered throughout the lungs  bilaterally, with a definite upper lung predominance. Overall, the  findings are compatible with sarcoidosis (i.e., progression from  stage I to stage II).  Original Report Authenticated By: [name], M.D.  09/25/12- 17 yoF never smoker followed for sarcoid complicated by hypercalcemia, renal insufficency   Dr [name] PCP, Dr Ninetta Lights. FOLLOWS FOR: increased SOB but no wheezing; "just feel winded". She remains on prednisone, started at 60 mg per day on January 10 while in hospital. Now tapering by 10 mg per week and is at 30 mg daily. ACE 08/22/12- down from 86 2 months ago to 26. She says her persistent blinking is because of dry eyes despite use of lubricant from her eye doctor.  11/03/12- 61 yoF never smoker followed for sarcoid complicated by hypercalcemia, renal insufficency   Dr [name] PCP, Dr Ninetta Lights. FOLLOWS FOR: Breathing is unchanged. Reports SOB  with exertion, chest pain and coughing from time to time. Denies chest tightness or wheezing. She did a multiple sclerosis fundraiser walk, stopping on hills. This is an unusual amount of exercise for her. Denies seasonal rhinitis complaints. Continues prednisone 20 mg daily for sarcoid. ACE 08/22/12- 26  CXR 09/25/12 Comparison: Chest x-ray of 04/21/2009 and CT of 07/23/2012.  Findings: The heart is borderline. The hila and mediastinum appear  normal. Right paratracheal thickening on the preceding chest x-ray  is less obvious. The lungs appear clear. Changes of sarcoid seen  on the recent CT examination are not clearly evident on the  radiographs because of the small size of the perilymphatic nodules  noted. No airspace disease. No effusions or pneumothoraces. No  acute bony changes.  IMPRESSION:  No acute findings in the chest.  Original Report Authenticated By: Vallery Ridge, M.D.  12/19/12- 16 yoF never smoker followed for sarcoid complicated by hypercalcemia, renal insufficency   Dr [name] PCP, Dr Ninetta Lights.ROS-see HPI FOLLOWS FOR: still notices SOB with activity Occasionally sore upper sternum, dyspnea with exertion but denies cough more swollen glands. Has been on prednisone 10 mg daily since last visit. ACE 11/03/12- 30 (8-52)   Glucose elevated on steroids CXR 09/25/12 IMPRESSION:  No acute findings in the chest.  Original Report Authenticated By: Vallery Ridge, M.D.  02/06/13- 62 yoF never smoker followed for sarcoid complicated by hypercalcemia, renal insufficency   Dr [name] PCP, Dr Ninetta Lights. FOLLOWS FOR: Pt reports breathing is about the same-- denies any other concerns at this time  has been on 1/2x20 mg prednisone every other day x3 months. Feels well. Describes one day of  transient nonexertional left anterior chest pain associated with migraine. Still, nonradiating, left of sternum. Chronic left knee pain limits exercise. Pending injection to treat.  ROS- see  HPI Constitutional:   No-   weight loss, night sweats, fevers, chills, no-fatigue, lassitude. HEENT:   No-  headaches, difficulty swallowing, tooth/dental problems, sore throat,       No-  sneezing, itching, ear ache, nasal congestion, post nasal drip,  CV:  +atypical chest pain, no-orthopnea, PND, swelling in lower extremities, anasarca,                                  dizziness, palpitations Resp: + shortness of breath with exertion or at rest.              No-   productive cough,  No non-productive cough,  No- coughing up of blood.              No- wheezing.   Skin: No-   rash or lesions. GI:  No-   heartburn, indigestion, abdominal pain, nausea, vomiting,  GU:  MS:  No-   joint pain or swelling.   Neuro-     nothing unusual Psych:  No- change in mood or affect. No depression or anxiety.  No memory loss.  OBJ- Physical Exam General- Alert, Oriented, Affect-appropriate, Distress- none acute, overweight Skin- rash-none, lesions- none, excoriation- none Lymphadenopathy- none Head- atraumatic            Eyes- Gross vision intact, PERRLA, conjunctivae clear, frequent blinking            Ears- Hearing, canals-normal            Nose- Clear, no-Septal dev, mucus, polyps, erosion, perforation             Throat- Mallampati II , mucosa clear , drainage- none, tonsils- atrophic Neck- flexible , trachea midline, no stridor , thyroid nl, carotid no bruit Chest - symmetrical excursion , unlabored           Heart/CV- RRR , no murmur , no gallop  , no rub, nl s1 s2                           - JVD- none , edema+1, stasis changes- none, varices- none           Lung- clear to P&A, wheeze- none, cough- none , dullness-none, rub- none           Chest wall-  Abd-  Br/ Gen/ Rectal- Not done, not indicated Extrem- cyanosis- none, clubbing, none, atrophy- none, strength- nl Neuro- +blinking, grimacing, ? Tics or frontal lobe

## 2013-02-06 NOTE — Patient Instructions (Addendum)
We can continue prednisone 1/2 x 20 mg every other day until next visit  Order- CXR    Dx Sarcoid              Lab-  ACE level

## 2013-02-10 NOTE — Progress Notes (Signed)
Quick Note:  Pt aware of results. ______ 

## 2013-02-12 ENCOUNTER — Encounter: Payer: Self-pay | Admitting: Gastroenterology

## 2013-02-13 IMAGING — CT CT HEAD W/O CM
1 series · 16 of 30 positions shown, 20 images · non-contrast
Comparison: Brain MRI 05/08/2008.

CLINICAL DATA: Severe headache.

CT HEAD WITHOUT CONTRAST
TECHNIQUE: Contiguous axial images were obtained from the base of
the skull through the vertex without contrast.

[Series 2: head routine 4.8 h37s · axial · 0.43mm/px · z∈[-153,-1]mm · 16 of 36 slices shown, 20 images]
[im 2/36  brain]
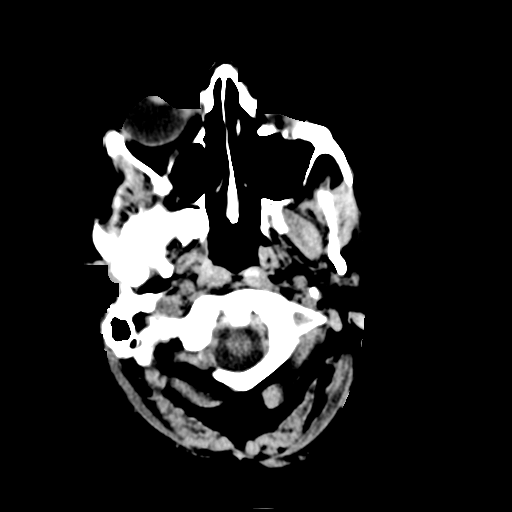
[im 2/36  bone]
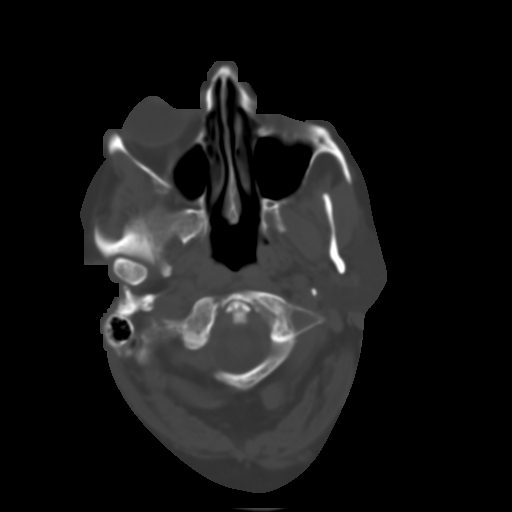
[im 4/36  brain]
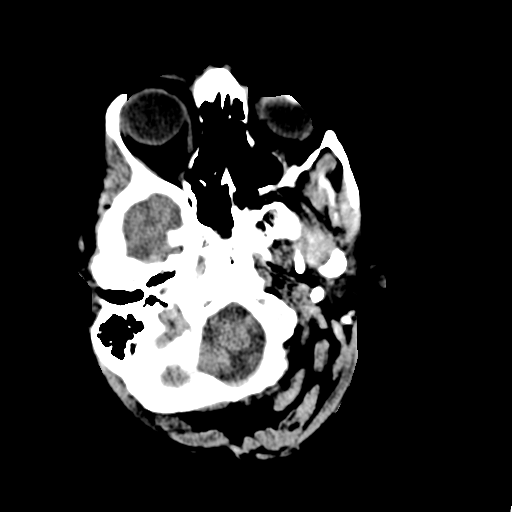
[im 7/36  brain]
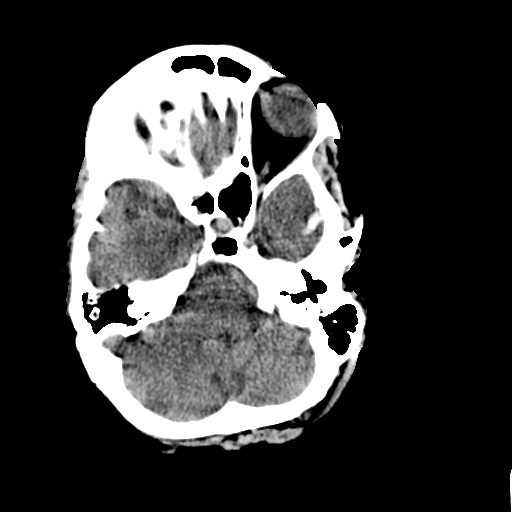
[im 9/36  brain]
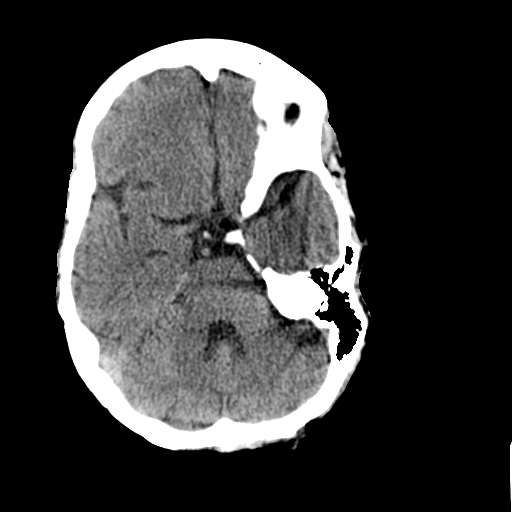
[im 10/36  brain]
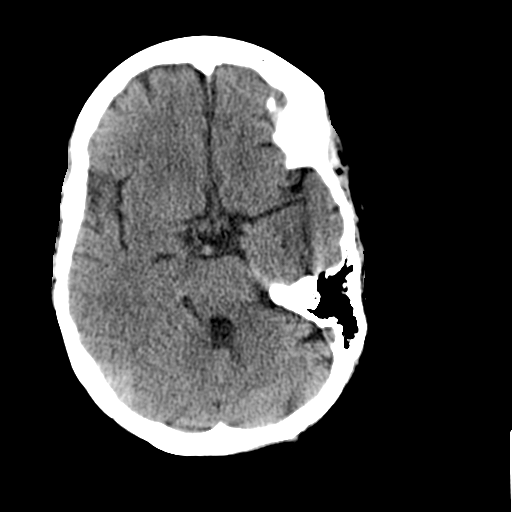
[im 10/36  bone]
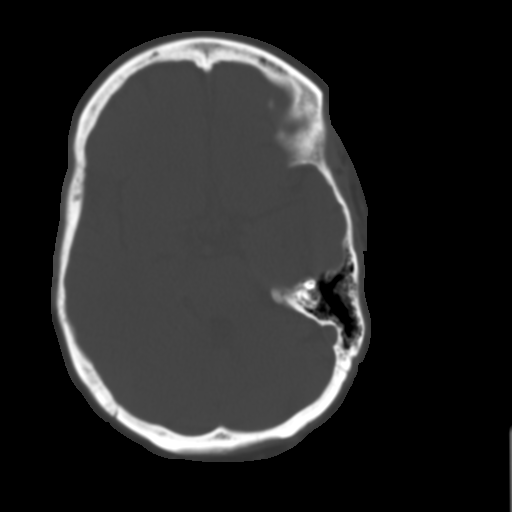
[im 13/36  brain]
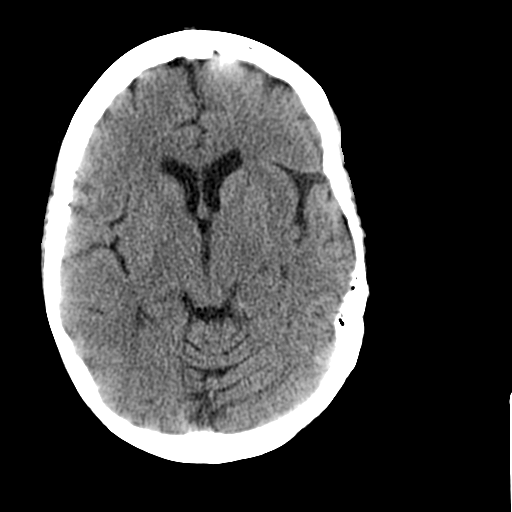
[im 15/36  brain]
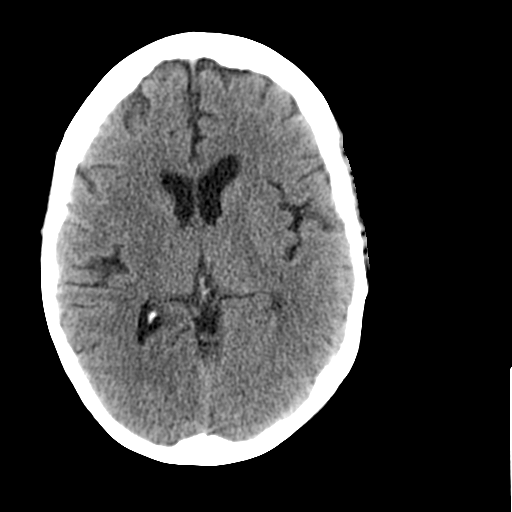
[im 17/36  brain]
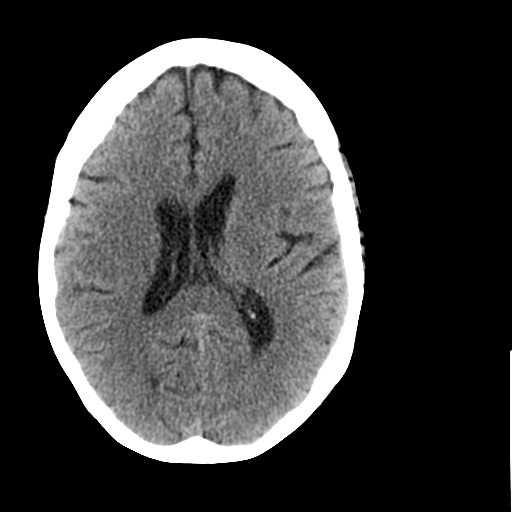
[im 19/36  brain]
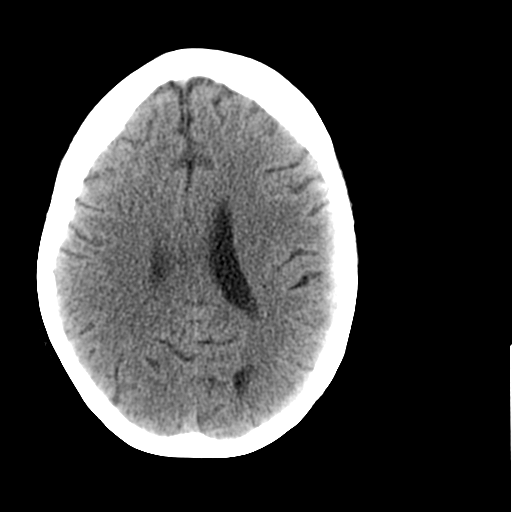
[im 19/36  bone]
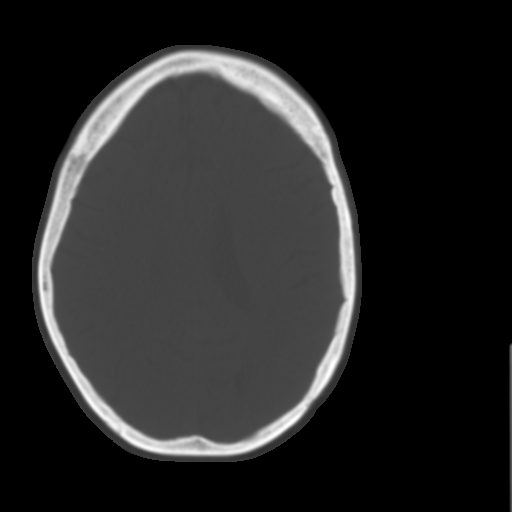
[im 21/36  brain]
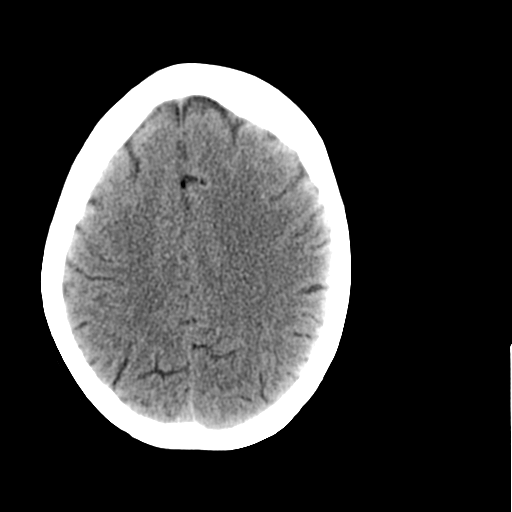
[im 23/36  brain]
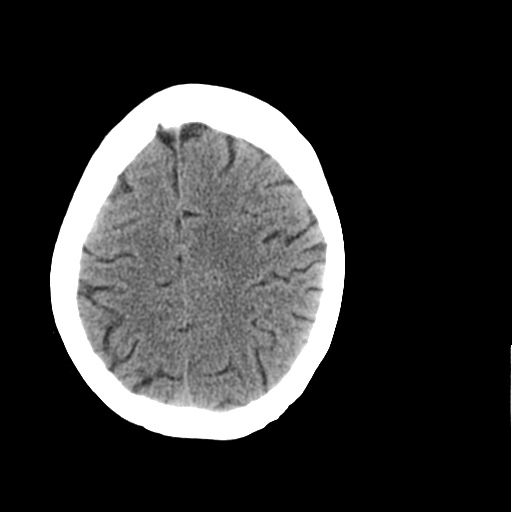
[im 26/36  brain]
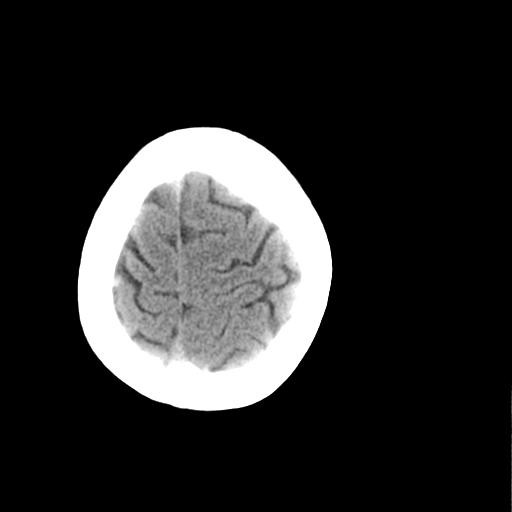
[im 27/36  brain]
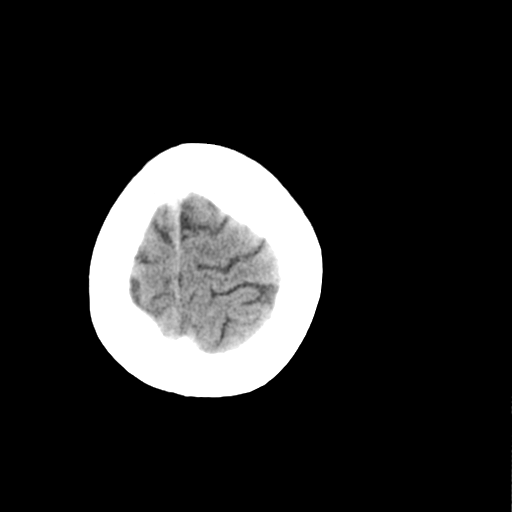
[im 27/36  bone]
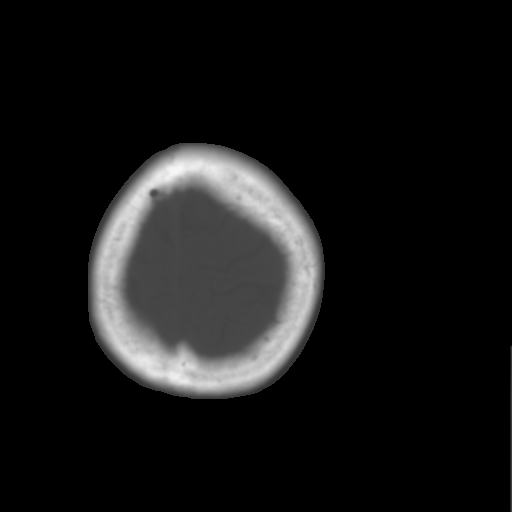
[im 29/36  brain]
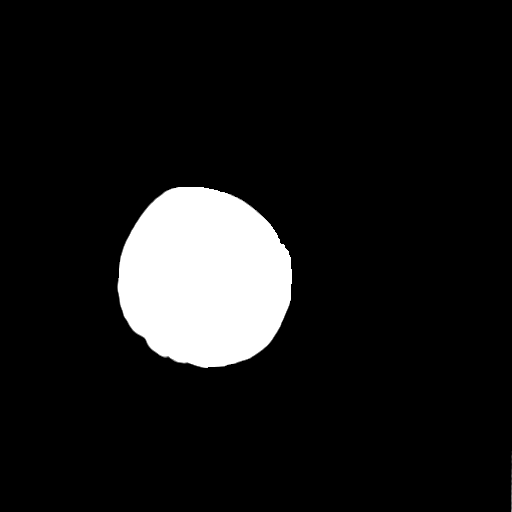
[im 32/36  brain]
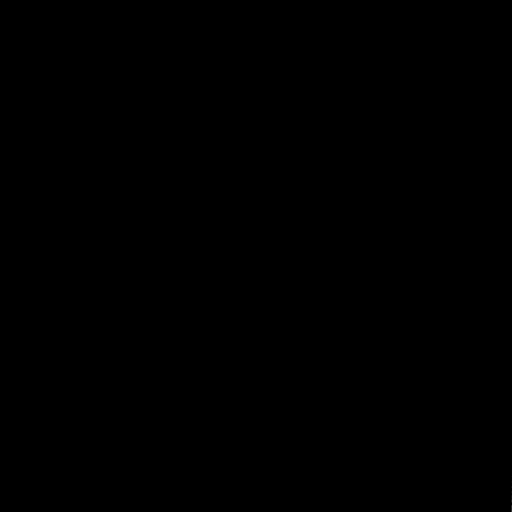
[im 34/36  brain]
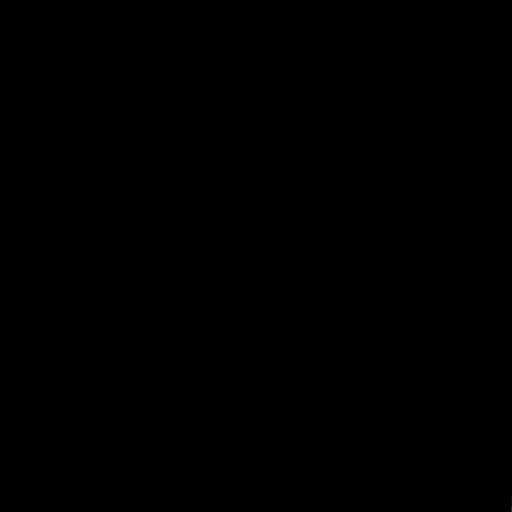

[16 of 30 positions shown; findings below may reference images not displayed]

FINDINGS: The brain appears normal without infarct, hemorrhage,
mass lesion, mass effect, midline shift or abnormal extra-axial
fluid collection.  No hydrocephalus or pneumocephalus.  Calvarium
intact.
IMPRESSION: Negative exam.

## 2013-02-13 IMAGING — CT CT CHEST W/O CM
2 of 4 series · 15 of 36 positions shown, 18 images · non-contrast
Comparison: Chest CT 08/11/2007.

CLINICAL DATA: Hypercalcemia.  History of sarcoid.

CT CHEST WITHOUT CONTRAST
TECHNIQUE: Multidetector CT imaging of the chest was performed
following the standard protocol without IV contrast.

[Series 2: routine chest 5.0 st · axial · 0.64mm/px · z∈[-266,-40]mm · 12 of 53 slices shown, 15 images]
[im 4/53  mediastinal]
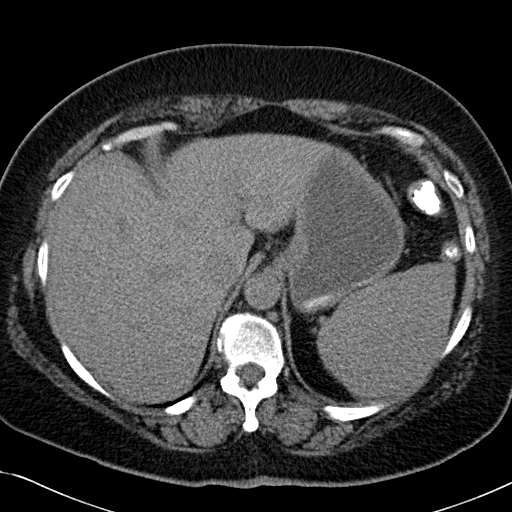
[im 4/53  lung]
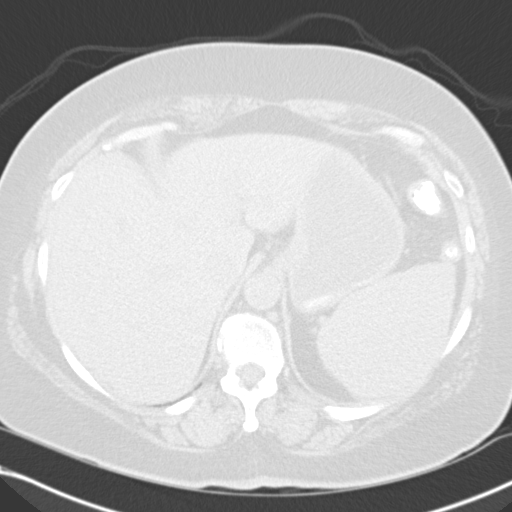
[im 8/53  lung]
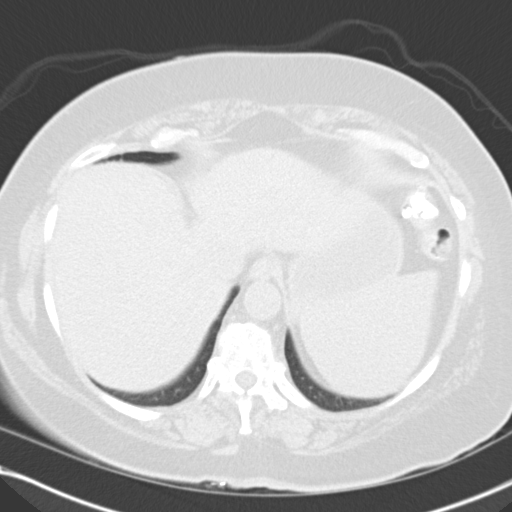
[im 12/53  lung]
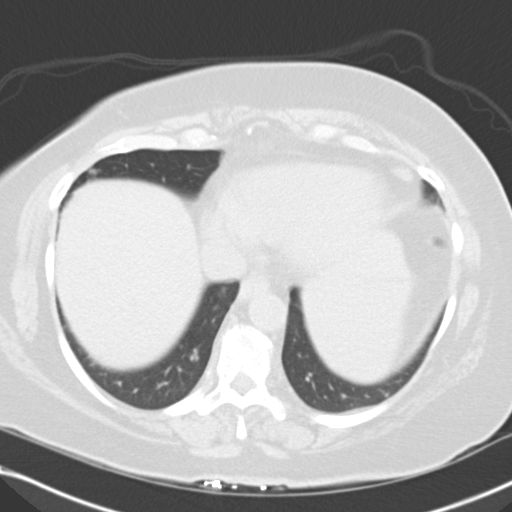
[im 15/53  lung]
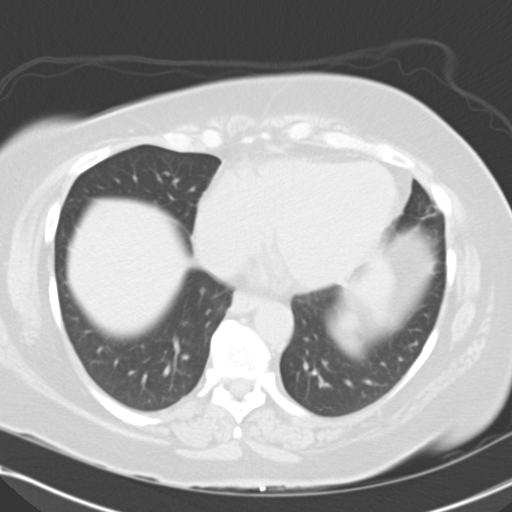
[im 19/53  mediastinal]
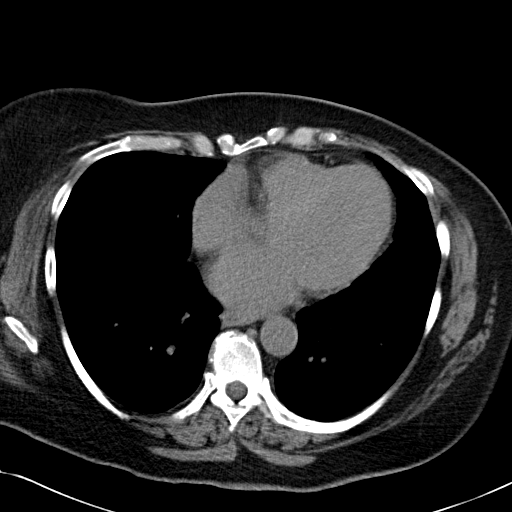
[im 19/53  lung]
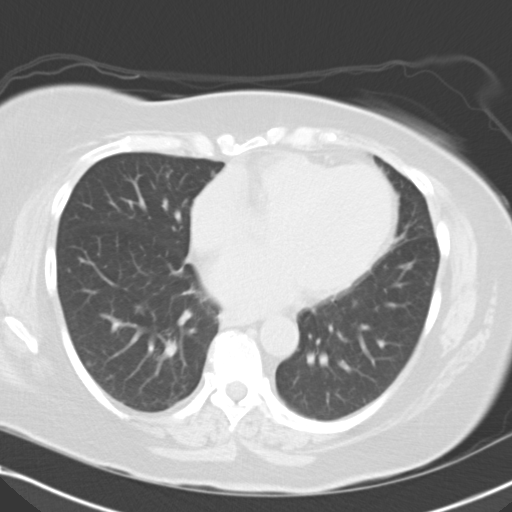
[im 23/53  lung]
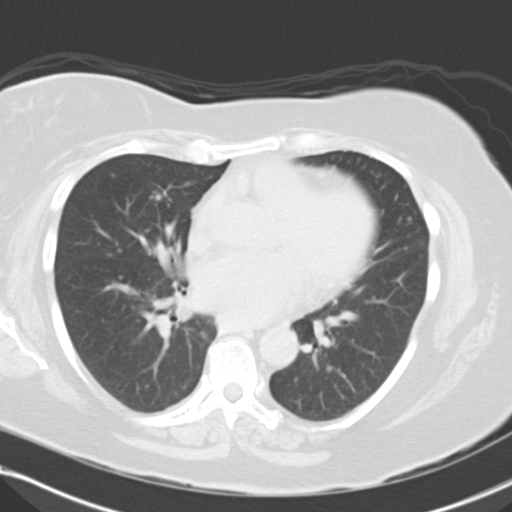
[im 30/53  lung]
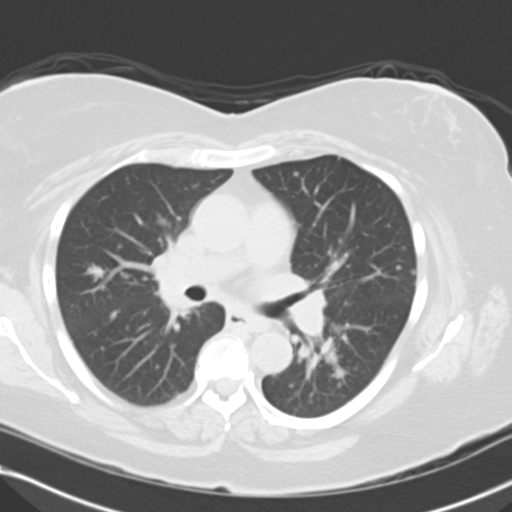
[im 34/53  lung]
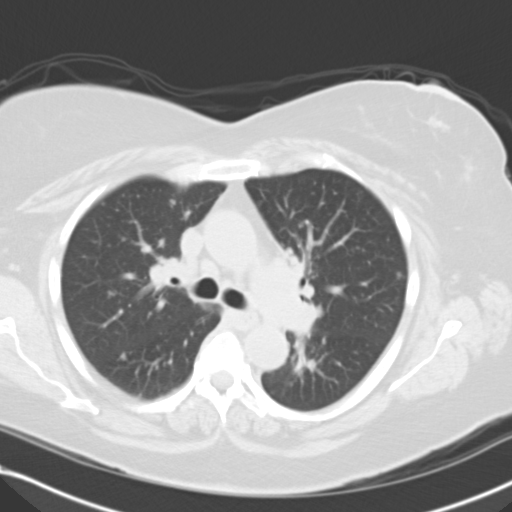
[im 38/53  mediastinal]
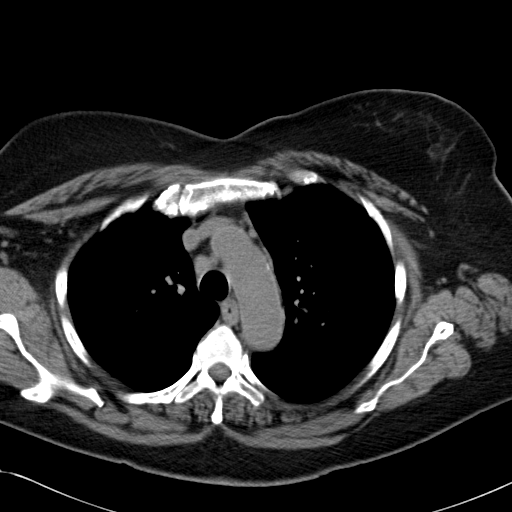
[im 38/53  lung]
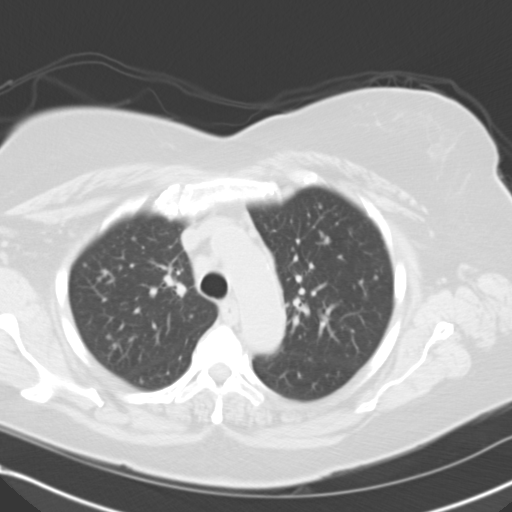
[im 41/53  lung]
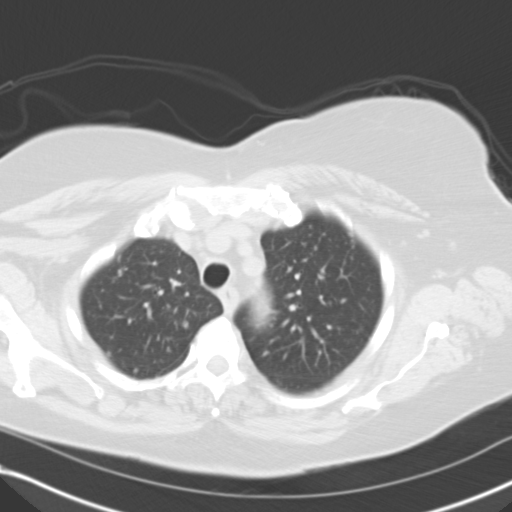
[im 45/53  lung]
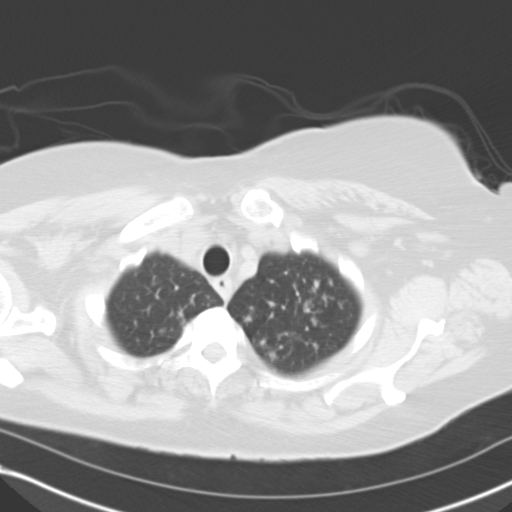
[im 49/53  lung]
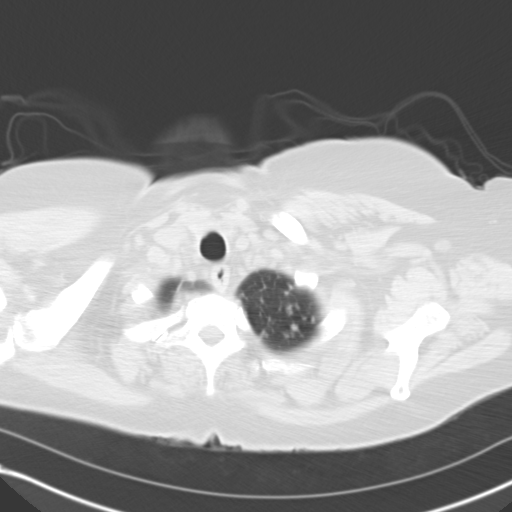

[Series 5: coronals · coronal · 0.53mm/px · 3 of 98 slices shown]
[im 20/98  lung]
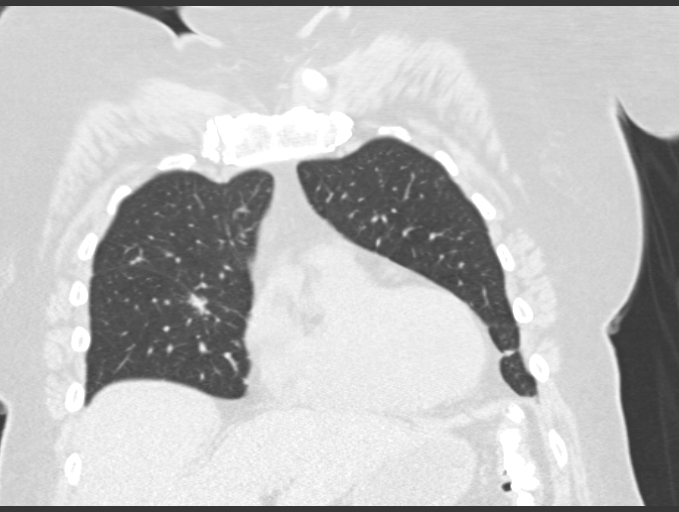
[im 39/98  lung]
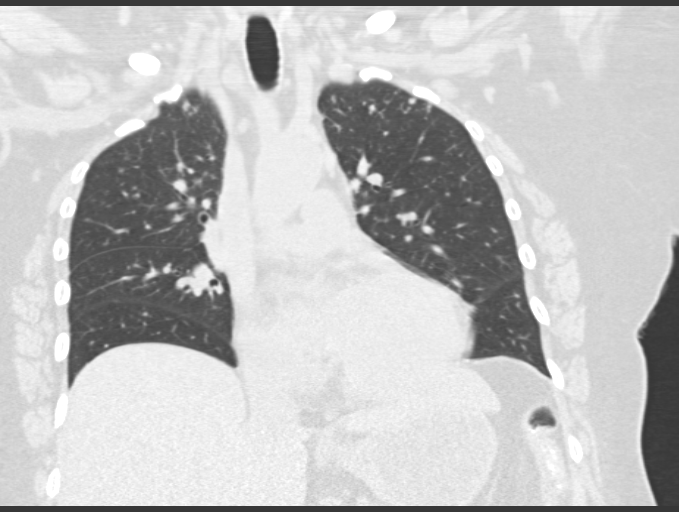
[im 59/98  lung]
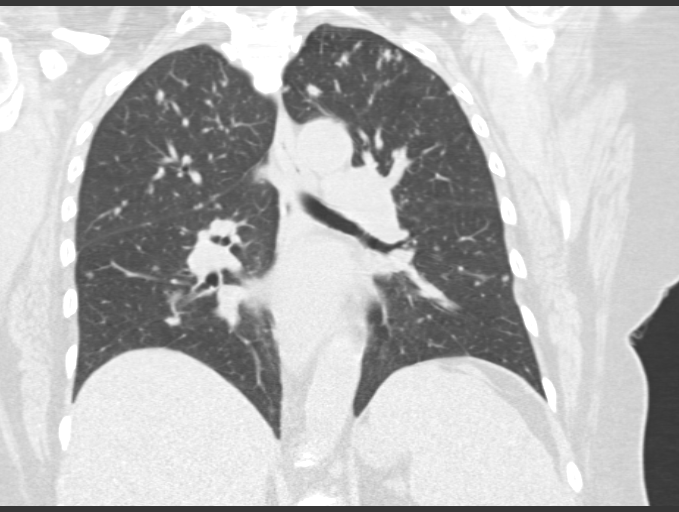

[15 of 36 positions shown; findings below may reference images not displayed]

FINDINGS: Mediastinum: Heart size is borderline enlarged. There is no
significant pericardial fluid, thickening or pericardial
calcification.  There are numerous borderline enlarged and mildly
enlarged mediastinal and bilateral hilar lymph nodes, with the
largest single node measuring approximately 1.1 cm in short axis in
the right paratracheal station.  Hilar lymphadenopathy is poorly
evaluated on this noncontrast CT examination.  Esophagus is
unremarkable in appearance.

Lungs/Pleura: Scattered throughout the lungs bilaterally there are
innumerable pulmonary nodules which appear to be located
predominantly in a peribronchovascular distribution, although there
are several subpleural nodules in the periphery of the lungs and
along the fissures bilaterally (i.e., this is a perilymphatic
distribution of this process).  Additionally, there is an upper
lung predominance of these findings with relative sparing of the
lung bases.  No acute consolidative airspace disease.  No pleural
effusions.

Upper Abdomen: Unremarkable.

Musculoskeletal: There are no aggressive appearing lytic or blastic
lesions noted in the visualized portions of the skeleton.
IMPRESSION: 1.  While the previously noted mediastinal and bilateral hilar
lymphadenopathy appears to have decreased, there are now
innumerable perilymphatic nodules scattered throughout the lungs
bilaterally, with a definite upper lung predominance.  Overall, the
findings are compatible with sarcoidosis (i.e., progression from
stage I to stage II).

## 2013-02-21 NOTE — Assessment & Plan Note (Signed)
Has been on prednisone 10 mg every other day for the past 3 months. She blames recent atypical chest pains on sarcoid. Plan-continue prednisone 10 mg every other day for now while we check chest x-ray and ACE level for sarcoid activity. Steroid talk done again.

## 2013-02-26 ENCOUNTER — Encounter: Payer: Self-pay | Admitting: Internal Medicine

## 2013-02-26 ENCOUNTER — Ambulatory Visit (INDEPENDENT_AMBULATORY_CARE_PROVIDER_SITE_OTHER): Payer: Managed Care, Other (non HMO) | Admitting: Internal Medicine

## 2013-02-26 VITALS — BP 142/90 | HR 76 | Temp 97.3°F | Ht 66.0 in | Wt 242.0 lb

## 2013-02-26 DIAGNOSIS — E099 Drug or chemical induced diabetes mellitus without complications: Secondary | ICD-10-CM

## 2013-02-26 DIAGNOSIS — T380X5A Adverse effect of glucocorticoids and synthetic analogues, initial encounter: Secondary | ICD-10-CM

## 2013-02-26 DIAGNOSIS — Z0000 Encounter for general adult medical examination without abnormal findings: Secondary | ICD-10-CM

## 2013-02-26 DIAGNOSIS — E139 Other specified diabetes mellitus without complications: Secondary | ICD-10-CM

## 2013-02-26 DIAGNOSIS — I10 Essential (primary) hypertension: Secondary | ICD-10-CM

## 2013-02-26 DIAGNOSIS — F3289 Other specified depressive episodes: Secondary | ICD-10-CM

## 2013-02-26 DIAGNOSIS — F329 Major depressive disorder, single episode, unspecified: Secondary | ICD-10-CM

## 2013-02-26 DIAGNOSIS — IMO0001 Reserved for inherently not codable concepts without codable children: Secondary | ICD-10-CM

## 2013-02-26 NOTE — Progress Notes (Signed)
Subjective:    Patient ID: Tara [name], female    DOB: [birth year], 62 y.o.   MRN: [id number]  HPI  Here to f/u; overall doing ok,  Pt denies chest pain, increased sob or doe, wheezing, orthopnea, PND, increased LE swelling, palpitations, dizziness or syncope.  Pt denies polydipsia, polyuria, or low sugar symptoms such as weakness or confusion improved with po intake.  Pt denies new neurological symptoms such as new headache, or facial or extremity weakness or numbness.   Pt states overall good compliance with meds, has been trying to follow lower cholesterol, diabetic diet, with wt overall stable,  but little exercise however. Due for colonoscopy later this yr  Still with left knee pain, has seen [facility], due for 5th injection hyalgan next wk. . Has seen DR Annamaria Boots for sarcoid.  CBG's occas in the 140's  Denies worsening depressive symptoms, suicidal ideation, or panic; has ongoing anxiety Past Medical History  Diagnosis Date  . ANKLE PAIN, LEFT 04/01/2008  . ANXIETY 04/17/2007  . COLONIC POLYPS, HX OF 08/01/2007  . CONTUSIONS, MULTIPLE 04/01/2009  . DEPRESSION 04/17/2007  . DIZZINESS 08/01/2007  . DYSPNEA 08/01/2007  . Enlargement of lymph nodes 08/13/2007  . GLUCOSE INTOLERANCE 08/01/2007  . HYPERLIPIDEMIA 08/01/2007  . HYPERSOMNIA 07/28/2008  . HYPERTENSION 04/17/2007  . JOINT EFFUSION, LEFT KNEE 06/02/2010  . Loose body in knee 04/01/2009  . Morbid obesity 04/20/2007  . OTHER DISEASES OF LUNG NOT ELSEWHERE CLASSIFIED 08/01/2007  . Pain in joint, lower leg 04/01/2009  . PERIPHERAL EDEMA 04/21/2009  . Sarcoidosis 09/25/2007  . SHOULDER PAIN, LEFT 04/01/2009  . Impaired glucose tolerance 03/23/2011  . Oligomenorrhea   . Migraines     "stopped 3-4 yr ago" (07/23/2012)  . 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 Chronic kidney disease    Past Surgical History  Procedure Laterality Date  . Myomectomy  1994  . Fracture surgery  ?02/1997    "left upper arm; put rod in" (07/23/2012)  . Refractive  surgery  08/1998    "both eyes" (07/23/2012)  . Combined mediastinoscopy and bronchoscopy  08/2007  . Gum surgery  2000-?2009    "several ORs; soft tissue graft; took material from roof of mouth" (07/23/2012  . Knee arthroscopy  03/2004; 06/2009    "right; left" Dr. Theda Sers  . Lymph node biopsy  ~ 2009    "for sarcoidosis; don't know exactly which nodes" (07/23/2102)  . Combined mediastinoscopy and bronchoscopy  2009    reports that she has never smoked. She has never used smokeless tobacco. She reports that  drinks alcohol. She reports that she does not use illicit drugs. family history includes Asthma in her sister; Diabetes in her mother; Heart disease (age of onset: 32) in her father; Hypertension in her father, mother, and sister; Stroke in her mother. Allergies  Allergen Reactions  . Floxin [Ocuflox]   . Fluoxetine Hcl   . Sulfonamide Derivatives    Current Outpatient Prescriptions on File Prior to Visit  Medication Sig Dispense Refill  . amphetamine-dextroamphetamine (ADDERALL) 30 MG tablet Take 30 mg by mouth 2 (two) times daily.      Marland Kitchen aspirin 81 MG tablet Take 81 mg by mouth daily.        Marland Kitchen atenolol (TENORMIN) 50 MG tablet TAKE 1 TABLET BY MOUTH EVERY NIGHT AT BEDTIME  90 tablet  3  . clorazepate (TRANXENE) 7.5 MG tablet Take 7.5 mg by mouth QID.       Marland Kitchen desvenlafaxine (PRISTIQ) 50 MG  24 hr tablet Take 50 mg by mouth daily.      Marland Kitchen diltiazem (CARTIA XT) 240 MG 24 hr capsule Take 1 capsule (240 mg total) by mouth daily.  90 capsule  3  . ferrous sulfate 325 (65 FE) MG tablet Take 325 mg by mouth daily with breakfast.      . furosemide (LASIX) 20 MG tablet Take 20 mg by mouth 2 (two) times daily.       Marland Kitchen glucosamine-chondroitin 500-400 MG tablet Take 1 tablet by mouth 3 (three) times daily.      Marland Kitchen glucose blood (ONETOUCH VERIO) test strip Use as instructed twice per day  250.02  200 each  12  . Lancets MISC 1 application by Does not apply route 2 (two) times daily.  200 each  5  .  metFORMIN (GLUCOPHAGE XR) 500 MG 24 hr tablet 1 tab by mouth every AM, except for 2 tabs in the AM for CBG > 180  180 tablet  3  . Multiple Vitamin (MULTIVITAMIN) tablet Take 1 tablet by mouth daily.      . predniSONE (DELTASONE) 20 MG tablet Take 0.5 tablets (10 mg total) by mouth every other day. Every other day  30 tablet  prn  . valsartan (DIOVAN) 320 MG tablet Take 1 tablet (320 mg total) by mouth daily.  90 tablet  3   No current facility-administered medications on file prior to visit.   Review of Systems  Constitutional: Negative for unexpected weight change, or unusual diaphoresis  HENT: Negative for tinnitus.   Eyes: Negative for photophobia and visual disturbance.  Respiratory: Negative for choking and stridor.   Gastrointestinal: Negative for vomiting and blood in stool.  Genitourinary: Negative for hematuria and decreased urine volume.  Musculoskeletal: Negative for acute joint swelling Skin: Negative for color change and wound.  Neurological: Negative for tremors and numbness other than noted  Psychiatric/Behavioral: Negative for decreased concentration or  hyperactivity.  '    Objective:   Physical Exam BP 142/90  Pulse 76  Temp(Src) 97.3 F (36.3 C) (Oral)  Ht 5\' 6"  (1.676 m)  Wt 242 lb (109.77 kg)  BMI 39.08 kg/m2  SpO2 96% VS noted,  Constitutional: Pt appears well-developed and well-nourished/obese.  HENT: Head: NCAT.  Right Ear: External ear normal.  Left Ear: External ear normal.  Eyes: Conjunctivae and EOM are normal. Pupils are equal, round, and reactive to light.  Neck: Normal range of motion. Neck supple.  Cardiovascular: Normal rate and regular rhythm.   Pulmonary/Chest: Effort normal and breath sounds normal.  Abd:  Soft, NT, non-distended, + BS Neurological: Pt is alert. Not confused, motor 5/5  Skin: Skin is warm. No erythema.  Psychiatric: Pt behavior is normal. Thought content normal. mild nervous    Assessment & Plan:

## 2013-02-26 NOTE — Assessment & Plan Note (Signed)
stable overall by history and exam, recent data reviewed with pt, and pt to continue medical treatment as before,  to f/u any worsening symptoms or concerns Lab Results  Component Value Date   WBC 6.4 07/23/2012   HGB 11.5* 07/23/2012   HCT 33.8* 07/23/2012   PLT 183 07/23/2012   GLUCOSE 147* 11/26/2012   CHOL 177 11/26/2012   TRIG 221.0* 11/26/2012   HDL 54.70 11/26/2012   LDLDIRECT 59.8 11/26/2012   LDLCALC 61 07/22/2012   ALT 29 11/26/2012   AST 23 11/26/2012   NA 138 11/26/2012   K 3.8 11/26/2012   CL 101 11/26/2012   CREATININE 1.2 11/26/2012   BUN 17 11/26/2012   CO2 31 11/26/2012   TSH 3.379 07/22/2012   INR 0.9 09/04/2007   HGBA1C 6.5 11/26/2012

## 2013-02-26 NOTE — Assessment & Plan Note (Signed)
stable overall by history and exam, recent data reviewed with pt, and pt to continue medical treatment as before,  to f/u any worsening symptoms or concerns BP Readings from Last 3 Encounters:  02/26/13 142/90  02/06/13 140/80  01/03/13 140/82

## 2013-02-26 NOTE — Patient Instructions (Addendum)
You will be contacted regarding the referral for: colonoscopy Please continue all other medications as before, and refills have been done if requested. Please have the pharmacy call with any other refills you may need. Please keep your appointments with your specialists as you have planned, including orthopedic for the left knee No further lab work needed today  Please return in 6 months, or sooner if needed, with Lab testing done 3-5 days before

## 2013-02-26 NOTE — Assessment & Plan Note (Signed)
stable overall by history and exam, recent data reviewed with pt, and pt to continue medical treatment as before,  to f/u any worsening symptoms or concerns Lab Results  Component Value Date   HGBA1C 6.5 11/26/2012

## 2013-03-02 ENCOUNTER — Encounter: Payer: Self-pay | Admitting: Gastroenterology

## 2013-05-08 ENCOUNTER — Ambulatory Visit (AMBULATORY_SURGERY_CENTER): Payer: Self-pay | Admitting: *Deleted

## 2013-05-08 VITALS — Ht 66.0 in | Wt 241.0 lb

## 2013-05-08 DIAGNOSIS — Z86010 Personal history of colonic polyps: Secondary | ICD-10-CM

## 2013-05-08 MED ORDER — MOVIPREP 100 G PO SOLR: ORAL | Status: DC

## 2013-05-08 NOTE — Progress Notes (Signed)
No allergies to eggs or soy. No problems with anesthesia.  No oxygen use

## 2013-05-13 ENCOUNTER — Encounter: Payer: Self-pay | Admitting: Gastroenterology

## 2013-05-15 ENCOUNTER — Ambulatory Visit (INDEPENDENT_AMBULATORY_CARE_PROVIDER_SITE_OTHER): Payer: Managed Care, Other (non HMO) | Admitting: Internal Medicine

## 2013-05-15 ENCOUNTER — Encounter: Payer: Self-pay | Admitting: Internal Medicine

## 2013-05-15 VITALS — BP 118/78 | HR 86 | Ht 66.0 in | Wt 244.6 lb

## 2013-05-15 DIAGNOSIS — D869 Sarcoidosis, unspecified: Secondary | ICD-10-CM

## 2013-05-15 NOTE — Progress Notes (Signed)
08/22/12- 61 yoF never smoker followed for sarcoid complicated by hypercalcemia, renal insufficency  Post Hospital-also would like to know about Iron supplement. Dr [name] PCP, Dr [facility]. Last seen here 10/04/08- had been dx'd w/ OSA but disliked CPAP and f/u NPSG 06/25/08 was negative so that issue considered resolved Sarcoid dx'd 2009 - mediastinoscopy and bronchoscopy. Prednisone x 1 year. Now hosp 1/7-1/10/14 for hypercalcemia and renal insufficiency associated with multiple pulmonary nodules of stage II sarcoid. Breast biopsy positive for noncaseating granulomas. Started on prednisone 60 mg daily at that time, anticipating one month to reduce by 5-10 mg increments. She notes that eyes burn and visual acuity is not as sharp, appetite increased on steroids. No night sweats or fever and little cough. ACE 07/23/12  86 (8-52) CT chest 07/23/12 IMPRESSION:  1. While the previously noted mediastinal and bilateral hilar  lymphadenopathy appears to have decreased, there are now  innumerable perilymphatic nodules scattered throughout the lungs  bilaterally, with a definite upper lung predominance. Overall, the  findings are compatible with sarcoidosis (i.e., progression from  stage I to stage II).  Original Report Authenticated By: [name], M.D.  09/25/12- 17 yoF never smoker followed for sarcoid complicated by hypercalcemia, renal insufficency   Dr [name] PCP, Dr Ninetta Lights. FOLLOWS FOR: increased SOB but no wheezing; "just feel winded". She remains on prednisone, started at 60 mg per day on January 10 while in hospital. Now tapering by 10 mg per week and is at 30 mg daily. ACE 08/22/12- down from 86 2 months ago to 26. She says her persistent blinking is because of dry eyes despite use of lubricant from her eye doctor.  11/03/12- 61 yoF never smoker followed for sarcoid complicated by hypercalcemia, renal insufficency   Dr [name] PCP, Dr Ninetta Lights. FOLLOWS FOR: Breathing is unchanged. Reports SOB  with exertion, chest pain and coughing from time to time. Denies chest tightness or wheezing. She did a multiple sclerosis fundraiser walk, stopping on hills. This is an unusual amount of exercise for her. Denies seasonal rhinitis complaints. Continues prednisone 20 mg daily for sarcoid. ACE 08/22/12- 26  CXR 09/25/12 Comparison: Chest x-ray of 04/21/2009 and CT of 07/23/2012.  Findings: The heart is borderline. The hila and mediastinum appear  normal. Right paratracheal thickening on the preceding chest x-ray  is less obvious. The lungs appear clear. Changes of sarcoid seen  on the recent CT examination are not clearly evident on the  radiographs because of the small size of the perilymphatic nodules  noted. No airspace disease. No effusions or pneumothoraces. No  acute bony changes.  IMPRESSION:  No acute findings in the chest.  Original Report Authenticated By: Vallery Ridge, M.D.  12/19/12- 16 yoF never smoker followed for sarcoid complicated by hypercalcemia, renal insufficency   Dr [name] PCP, Dr Ninetta Lights.ROS-see HPI FOLLOWS FOR: still notices SOB with activity Occasionally sore upper sternum, dyspnea with exertion but denies cough more swollen glands. Has been on prednisone 10 mg daily since last visit. ACE 11/03/12- 30 (8-52)   Glucose elevated on steroids CXR 09/25/12 IMPRESSION:  No acute findings in the chest.  Original Report Authenticated By: Vallery Ridge, M.D.  02/06/13- 62 yoF never smoker followed for sarcoid complicated by hypercalcemia, renal insufficency   Dr [name] PCP, Dr Ninetta Lights. FOLLOWS FOR: Pt reports breathing is about the same-- denies any other concerns at this time  has been on 1/2x20 mg prednisone every other day x3 months. Feels well. Describes one day of  transient nonexertional left anterior chest pain associated with migraine. Still, nonradiating, left of sternum. Chronic left knee pain limits exercise. Pending injection to treat.  05/15/13- 63 yoF  never smoker followed for sarcoid complicated by hypercalcemia, renal insufficency   Dr [name] PCP, Dr Ninetta Lights. FOLLOWS FOR: patient states she continues to have SOB with activity; also states she has recently had knee pain/issues/ osteoarthritis. Occasional dry cough. Knee pain limits exertion more than her dyspnea does. Has continued prednisone 10 mg every other day for sarcoid and dyspnea. CXR 02/06/13 Findings: The heart size and pulmonary vascularity are normal and  the lungs are clear. No osseous abnormality. No effusions.  IMPRESSION:  No acute disease.  Original Report Authenticated By: Lorriane Shire, M.D.  ROS- see HPI Constitutional:   No-   weight loss, night sweats, fevers, chills, no-fatigue, lassitude. HEENT:   No-  headaches, difficulty swallowing, tooth/dental problems, sore throat,       No-  sneezing, itching, ear ache, nasal congestion, post nasal drip,  CV:  No- chest pain, no-orthopnea, PND, swelling in lower extremities, anasarca,dizziness, palpitations  Resp: + shortness of breath with exertion or at rest.              No-   productive cough,  No non-productive cough,  No- coughing up of blood.              No- wheezing.   Skin: No-   rash or lesions. GI:  No-   heartburn, indigestion, abdominal pain, nausea, vomiting,  GU:  MS:  +  joint pain or swelling.   Neuro-     nothing unusual Psych:  No- change in mood or affect. No depression or anxiety.  No memory loss.  OBJ- Physical Exam General- Alert, Oriented, Affect-appropriate, Distress- none acute, overweight Skin- rash-none, lesions- none, excoriation- none Lymphadenopathy- none Head- atraumatic            Eyes- Gross vision intact, PERRLA, conjunctivae clear, frequent blinking            Ears- Hearing, canals-normal            Nose- Clear, no-Septal dev, mucus, polyps, erosion, perforation             Throat- Mallampati II , mucosa clear , drainage- none, tonsils- atrophic Neck- flexible , trachea  midline, no stridor , thyroid nl, carotid no bruit Chest - symmetrical excursion , unlabored           Heart/CV- RRR , no murmur , no gallop  , no rub, nl s1 s2                           - JVD- none , edema+1, stasis changes- none, varices- none           Lung- clear to P&A, wheeze- none, cough- none , dullness-none, rub- none           Chest wall-  Abd-  Br/ Gen/ Rectal- Not done, not indicated Extrem- cyanosis- none, clubbing, none, atrophy- none, strength- nl Neuro- +blinking, grimacing, ? Tics or frontal lobe

## 2013-05-15 NOTE — Patient Instructions (Signed)
Ok to stop prednisone and we will watch to see how you do.  Order- PFT   Dx sarcoid

## 2013-05-22 ENCOUNTER — Ambulatory Visit (AMBULATORY_SURGERY_CENTER): Payer: Managed Care, Other (non HMO) | Admitting: Gastroenterology

## 2013-05-22 ENCOUNTER — Encounter: Payer: Self-pay | Admitting: Gastroenterology

## 2013-05-22 VITALS — BP 140/64 | HR 74 | Temp 98.9°F | Resp 16 | Ht 66.0 in | Wt 241.0 lb

## 2013-05-22 DIAGNOSIS — Z860100 Personal history of colon polyps, unspecified: Secondary | ICD-10-CM

## 2013-05-22 DIAGNOSIS — D126 Benign neoplasm of colon, unspecified: Secondary | ICD-10-CM

## 2013-05-22 LAB — GLUCOSE, CAPILLARY: Glucose-Capillary: 115 mg/dL — ABNORMAL HIGH (ref 70–99)

## 2013-05-22 MED ORDER — SODIUM CHLORIDE 0.9 % IV SOLN: 500.0000 mL | INTRAVENOUS | Status: DC

## 2013-05-22 NOTE — Progress Notes (Signed)
Patient did not experience any of the following events: a burn prior to discharge; a fall within the facility; wrong site/side/patient/procedure/implant event; or a hospital transfer or hospital admission upon discharge from the facility. (G8907) Patient did not have preoperative order for IV antibiotic SSI prophylaxis. (G8918)  

## 2013-05-22 NOTE — Patient Instructions (Signed)
YOU HAD AN ENDOSCOPIC PROCEDURE TODAY AT THE [facility]: Refer to the procedure report that was given to you for any specific questions about what was found during the examination.  If the procedure report does not answer your questions, please call your gastroenterologist to clarify.  If you requested that your care partner not be given the details of your procedure findings, then the procedure report has been included in a sealed envelope for you to review at your convenience later.  YOU SHOULD EXPECT: Some feelings of bloating in the abdomen. Passage of more gas than usual.  Walking can help get rid of the air that was put into your GI tract during the procedure and reduce the bloating. If you had a lower endoscopy (such as a colonoscopy or flexible sigmoidoscopy) you may notice spotting of blood in your stool or on the toilet paper. If you underwent a bowel prep for your procedure, then you may not have a normal bowel movement for a few days.  DIET: Your first meal following the procedure should be a light meal and then it is ok to progress to your normal diet.  A half-sandwich or bowl of soup is an example of a good first meal.  Heavy or fried foods are harder to digest and may make you feel nauseous or bloated.  Likewise meals heavy in dairy and vegetables can cause extra gas to form and this can also increase the bloating.  Drink plenty of fluids but you should avoid alcoholic beverages for 24 hours.  ACTIVITY: Your care partner should take you home directly after the procedure.  You should plan to take it easy, moving slowly for the rest of the day.  You can resume normal activity the day after the procedure however you should NOT DRIVE or use heavy machinery for 24 hours (because of the sedation medicines used during the test).    SYMPTOMS TO REPORT IMMEDIATELY: A gastroenterologist can be reached at any hour.  During normal business hours, 8:30 AM to 5:00 PM Monday through Friday,  call [phone].  After hours and on weekends, please call the GI answering service at [phone] who will take a message and have the physician on call contact you.   Following lower endoscopy (colonoscopy or flexible sigmoidoscopy):  Excessive amounts of blood in the stool  Significant tenderness or worsening of abdominal pains  Swelling of the abdomen that is new, acute  Fever of 100F or higher    FOLLOW UP: If any biopsies were taken you will be contacted by phone or by letter within the next 1-3 weeks.  Call your gastroenterologist if you have not heard about the biopsies in 3 weeks.  Our staff will call the home number listed on your records the next business day following your procedure to check on you and address any questions or concerns that you may have at that time regarding the information given to you following your procedure. This is a courtesy call and so if there is no answer at the home number and we have not heard from you through the emergency physician on call, we will assume that you have returned to your regular daily activities without incident.  SIGNATURES/CONFIDENTIALITY: You and/or your care partner have signed paperwork which will be entered into your electronic medical record.  These signatures attest to the fact that that the information above on your After Visit Summary has been reviewed and is understood.  Full responsibility of the confidentiality   of this discharge information lies with you and/or your care-partner.    Information on polyps given to you today 

## 2013-05-22 NOTE — Progress Notes (Signed)
Called to room to assist during endoscopic procedure.  Patient ID and intended procedure confirmed with present staff. Received instructions for my participation in the procedure from the performing physician.  

## 2013-05-22 NOTE — Op Note (Signed)
[facility]  [address] [place], [address]   COLONOSCOPY PROCEDURE REPORT  PATIENT: [name], Tara  MR#: [id number] BIRTHDATE: Sep 29, 1950 , 62  yrs. old GENDER: Female ENDOSCOPIST: Inda Castle, MD REFERRED BY: PROCEDURE DATE:  05/22/2013 PROCEDURE:   Colonoscopy with snare polypectomy and Colonoscopy with cold biopsy polypectomy First Screening Colonoscopy - Avg.  risk and is 50 yrs.  old or older - No.  Prior Negative Screening - Now for repeat screening. 10 or more years since last screening  History of Adenoma - Now for follow-up colonoscopy & has been > or = to 3 yrs.  N/A  Polyps Removed Today? Yes. ASA CLASS:   Class II INDICATIONS:Average risk patient for colon cancer. MEDICATIONS: MAC sedation, administered by CRNA and propofol (Diprivan) 250mg  IV  DESCRIPTION OF PROCEDURE:   After the risks benefits and alternatives of the procedure were thoroughly explained, informed consent was obtained.  A digital rectal exam revealed no abnormalities of the rectum.   The LB [id number]  endoscope was introduced through the anus and advanced to the cecum, which was identified by both the appendix and ileocecal valve. No adverse events experienced.   The quality of the prep was excellent using Suprep  The instrument was then slowly withdrawn as the colon was fully examined.      COLON FINDINGS: Two sessile polyps were found in the ascending colon, 3 and 24mm respectively.   A polypectomy was performed with a cold snare and with cold forceps, respectively.  The resection was complete and the polyp tissue was completely retrieved.   The colon mucosa was otherwise normal.  Retroflexed views revealed no abnormalities. The time to cecum=3 minutes 19 seconds.  Withdrawal time=10 minutes 28 seconds.  The scope was withdrawn and the procedure completed. COMPLICATIONS: There were no complications.  ENDOSCOPIC IMPRESSION: 1.   Two sessile polyps were found  in the ascending colon; polypectomy was performed with a cold snare and with cold forceps, respectively 2.   The colon mucosa was otherwise normal  RECOMMENDATIONS: If the polyp(s) removed today are proven to be adenomatous (pre-cancerous) polyps, you will need a repeat colonoscopy in 5 years.  Otherwise you should continue to follow colorectal cancer screening guidelines for "routine risk" patients with colonoscopy in 10 years.  You will receive a letter within 1-2 weeks with the results of your biopsy as well as final recommendations.  Please call my office if you have not received a letter after 3 weeks.   eSigned:  Inda Castle, MD 05/22/2013 9:54 AM   cc: Biagio Borg, MD   PATIENT NAME:  Tara, [name] MR#: [id number]

## 2013-05-22 NOTE — Progress Notes (Signed)
Pt batted or blinked eyes frequently therefore questioned admitting nurse who said pt did this on admission and pt also says this is normal for her.

## 2013-05-22 NOTE — Progress Notes (Signed)
Procedure ends, to recovery, report given and VSS. 

## 2013-05-25 ENCOUNTER — Telehealth: Payer: Self-pay | Admitting: *Deleted

## 2013-05-25 NOTE — Telephone Encounter (Signed)
  Follow up Call-  Call back number 05/22/2013  Post procedure Call Back phone  # [phone] ( may leave a message here)    or [phone]  Permission to leave phone message Yes     Patient questions:  Do you have a fever, pain , or abdominal swelling? no Pain Score  0 *  Have you tolerated food without any problems? yes  Have you been able to return to your normal activities? yes  Do you have any questions about your discharge instructions: Diet   no Medications  no Follow up visit  no  Do you have questions or concerns about your Care? no  Actions: * If pain score is 4 or above: No action needed, pain <4.pt did say she had a cold with cough all weekend but nothing related to procedure on Friday & planned on making it to work today, she said she did fine when got home on Friday,ate ok and passed flatus without any problem and no abd. Pain.

## 2013-05-27 ENCOUNTER — Encounter: Payer: Self-pay | Admitting: Gastroenterology

## 2013-05-30 ENCOUNTER — Emergency Department (HOSPITAL_COMMUNITY): Payer: Managed Care, Other (non HMO)

## 2013-05-30 ENCOUNTER — Encounter (HOSPITAL_COMMUNITY): Payer: Self-pay | Admitting: Emergency Medicine

## 2013-05-30 ENCOUNTER — Emergency Department (HOSPITAL_COMMUNITY)
Admission: EM | Admit: 2013-05-30 | Discharge: 2013-05-31 | Disposition: A | Discharge status: Home / Self Care | Payer: Managed Care, Other (non HMO) | Attending: Emergency Medicine | Admitting: Emergency Medicine

## 2013-05-30 DIAGNOSIS — Z860100 Personal history of colon polyps, unspecified: Secondary | ICD-10-CM | POA: Insufficient documentation

## 2013-05-30 DIAGNOSIS — Z8619 Personal history of other infectious and parasitic diseases: Secondary | ICD-10-CM | POA: Insufficient documentation

## 2013-05-30 DIAGNOSIS — Z79899 Other long term (current) drug therapy: Secondary | ICD-10-CM | POA: Insufficient documentation

## 2013-05-30 DIAGNOSIS — J40 Bronchitis, not specified as acute or chronic: Secondary | ICD-10-CM

## 2013-05-30 DIAGNOSIS — F3289 Other specified depressive episodes: Secondary | ICD-10-CM | POA: Insufficient documentation

## 2013-05-30 DIAGNOSIS — I129 Hypertensive chronic kidney disease with stage 1 through stage 4 chronic kidney disease, or unspecified chronic kidney disease: Secondary | ICD-10-CM | POA: Insufficient documentation

## 2013-05-30 DIAGNOSIS — N189 Chronic kidney disease, unspecified: Secondary | ICD-10-CM | POA: Insufficient documentation

## 2013-05-30 DIAGNOSIS — R5381 Other malaise: Secondary | ICD-10-CM | POA: Insufficient documentation

## 2013-05-30 DIAGNOSIS — Z8742 Personal history of other diseases of the female genital tract: Secondary | ICD-10-CM | POA: Insufficient documentation

## 2013-05-30 DIAGNOSIS — E871 Hypo-osmolality and hyponatremia: Secondary | ICD-10-CM

## 2013-05-30 DIAGNOSIS — Z9181 History of falling: Secondary | ICD-10-CM | POA: Insufficient documentation

## 2013-05-30 DIAGNOSIS — Z87828 Personal history of other (healed) physical injury and trauma: Secondary | ICD-10-CM | POA: Insufficient documentation

## 2013-05-30 DIAGNOSIS — R630 Anorexia: Secondary | ICD-10-CM | POA: Insufficient documentation

## 2013-05-30 DIAGNOSIS — IMO0001 Reserved for inherently not codable concepts without codable children: Secondary | ICD-10-CM | POA: Insufficient documentation

## 2013-05-30 DIAGNOSIS — Z7982 Long term (current) use of aspirin: Secondary | ICD-10-CM | POA: Insufficient documentation

## 2013-05-30 DIAGNOSIS — F329 Major depressive disorder, single episode, unspecified: Secondary | ICD-10-CM | POA: Insufficient documentation

## 2013-05-30 DIAGNOSIS — F411 Generalized anxiety disorder: Secondary | ICD-10-CM | POA: Insufficient documentation

## 2013-05-30 DIAGNOSIS — J3489 Other specified disorders of nose and nasal sinuses: Secondary | ICD-10-CM | POA: Insufficient documentation

## 2013-05-30 MED ORDER — PREDNISONE 20 MG PO TABS
60.0000 mg | ORAL_TABLET | Freq: Once | ORAL | Status: AC
  Administered 2013-05-31: 60 mg via ORAL
  Filled 2013-05-30: qty 3

## 2013-05-30 MED ORDER — ALBUTEROL SULFATE (5 MG/ML) 0.5% IN NEBU
5.0000 mg | INHALATION_SOLUTION | Freq: Once | RESPIRATORY_TRACT | Status: AC
  Administered 2013-05-31: 5 mg via RESPIRATORY_TRACT
  Filled 2013-05-30: qty 1

## 2013-05-30 NOTE — ED Provider Notes (Signed)
CSN: [id number]     Arrival date & time 05/30/13  2232 History   This chart was scribed for non-physician practitioner Clayton Bibles, PA-C, working with Kathalene Frames, MD, by Neta Ehlers, ED Scribe. This patient was seen in room WTR6/WTR6 and the patient's care was started at 11:17 PM.  First MD Initiated Contact with Patient 05/30/13 2315     Chief Complaint  Patient presents with  . Cough  . Nasal Congestion    Patient is a 62 y.o. female presenting with cough. The history is provided by the patient. No language interpreter was used.  Cough Cough characteristics:  Dry Severity:  Mild Onset quality:  Unable to specify Duration:  7 days Timing:  Intermittent Progression:  Unchanged Smoker: no   Context: not sick contacts   Relieved by:  None tried Worsened by:  Nothing tried Ineffective treatments:  None tried Associated symptoms: chest pain (Post-tussive. ), chills, fever, myalgias and shortness of breath    HPI Comments: Tara [name] is a 62 y.o. female, with a h/o sarcoidisis, who presents to the Emergency Department complaining of a non-productive cough which has persisted for approximately a week. As associated symptoms, she also experienced congestion, wheezing, intermittent dyspnea, post-tussive chest pain, fatigue, generalized weakness, a subjective fever, intermittent myalgia, a decreased appetite, and rhinorrhea. She reports that she has fallen several times due to weakness in her knees; the pt reports the weakness and fatigue are not symptoms she has experienced with prior colds. The pt has not treated the symptoms with any medication. She denies any abdominal pain, emesis, diarrhea, epistaxis, or hematuria. She also denies any sick contacts. The pt states she has experienced baseline urinary incontinency; she has been treated by a nephrologist for the symptoms. The pt recently discontinued prednisone for the sarcoidosis. She is a non-smoker.   The pt works as a Education officer, community.  Past Medical History  Diagnosis Date  . ANKLE PAIN, LEFT 04/01/2008  . ANXIETY 04/17/2007  . COLONIC POLYPS, HX OF 08/01/2007  . CONTUSIONS, MULTIPLE 04/01/2009  . DEPRESSION 04/17/2007  . DIZZINESS 08/01/2007  . DYSPNEA 08/01/2007  . Enlargement of lymph nodes 08/13/2007  . GLUCOSE INTOLERANCE 08/01/2007  . HYPERLIPIDEMIA 08/01/2007  . HYPERSOMNIA 07/28/2008  . HYPERTENSION 04/17/2007  . JOINT EFFUSION, LEFT KNEE 06/02/2010  . Loose body in knee 04/01/2009  . Morbid obesity 04/20/2007  . OTHER DISEASES OF LUNG NOT ELSEWHERE CLASSIFIED 08/01/2007  . Pain in joint, lower leg 04/01/2009  . PERIPHERAL EDEMA 04/21/2009  . Sarcoidosis 09/25/2007  . SHOULDER PAIN, LEFT 04/01/2009  . Impaired glucose tolerance 03/23/2011  . Oligomenorrhea   . Migraines     "stopped 3-4 yr ago" (07/23/2012)  . 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 Chronic kidney disease    Past Surgical History  Procedure Laterality Date  . Myomectomy  1994  . Fracture surgery  ?02/1997    "left upper arm; put rod in" (07/23/2012)  . Refractive surgery  08/1998    "both eyes" (07/23/2012)  . Combined mediastinoscopy and bronchoscopy  08/2007  . Gum surgery  2000-?2009    "several ORs; soft tissue graft; took material from roof of mouth" (07/23/2012  . Knee arthroscopy  03/2004; 06/2009    "right; left" Dr. Theda Sers  . Lymph node biopsy  ~ 2009    "for sarcoidosis; don't know exactly which nodes" (07/23/2102)  . Combined mediastinoscopy and bronchoscopy  2009   Family History  Problem Relation Age of Onset  . Heart  disease Father 82  . Hypertension Father   . Diabetes Mother   . Hypertension Mother   . Stroke Mother   . Hypertension Sister   . Asthma Sister   . Colon cancer Neg Hx    History  Substance Use Topics  . Smoking status: Never Smoker   . Smokeless tobacco: Never Used  . Alcohol Use: Yes     Comment: rare   OB History   Grav Para Term Preterm Abortions TAB SAB Ect Mult Living   0              Review of  Systems  Constitutional: Positive for fever, chills, appetite change and fatigue.  HENT: Positive for congestion.   Respiratory: Positive for cough and shortness of breath.   Cardiovascular: Positive for chest pain (Post-tussive. ).  Gastrointestinal: Negative for vomiting, abdominal pain and diarrhea.  Musculoskeletal: Positive for myalgias.  Neurological: Positive for weakness.    Allergies  Floxin; Fluoxetine hcl; and Sulfonamide derivatives  Home Medications   Current Outpatient Rx  Name  Route  Sig  Dispense  Refill  . amphetamine-dextroamphetamine (ADDERALL) 30 MG tablet   Oral   Take 30 mg by mouth 2 (two) times daily.         Marland Kitchen aspirin 81 MG tablet   Oral   Take 81 mg by mouth daily.           Marland Kitchen atenolol (TENORMIN) 50 MG tablet      TAKE 1 TABLET BY MOUTH EVERY NIGHT AT BEDTIME   90 tablet   3   . clorazepate (TRANXENE) 7.5 MG tablet   Oral   Take 7.5 mg by mouth QID.          Marland Kitchen desvenlafaxine (PRISTIQ) 50 MG 24 hr tablet   Oral   Take 50 mg by mouth daily.         Marland Kitchen diltiazem (CARTIA XT) 240 MG 24 hr capsule   Oral   Take 1 capsule (240 mg total) by mouth daily.   90 capsule   3   . ferrous sulfate 325 (65 FE) MG tablet   Oral   Take 325 mg by mouth daily with breakfast.         . furosemide (LASIX) 20 MG tablet   Oral   Take 20 mg by mouth 2 (two) times daily.          Marland Kitchen glucosamine-chondroitin 500-400 MG tablet   Oral   Take 1 tablet by mouth 3 (three) times daily.         Marland Kitchen glucose blood (ONETOUCH VERIO) test strip      Use as instructed twice per day  250.02   200 each   12   . Lancets MISC   Does not apply   1 application by Does not apply route 2 (two) times daily.   200 each   5   . metFORMIN (GLUCOPHAGE XR) 500 MG 24 hr tablet      1 tab by mouth every AM, except for 2 tabs in the AM for CBG > 180   180 tablet   3   . Multiple Vitamin (MULTIVITAMIN) tablet   Oral   Take 1 tablet by mouth daily.         .  valsartan (DIOVAN) 320 MG tablet   Oral   Take 1 tablet (320 mg total) by mouth daily.   90 tablet   3   . zaleplon (SONATA) 10  MG capsule   Oral   Take 10 mg by mouth at bedtime.          Triage Vitals: BP 125/55  Pulse 74  Temp(Src) 97.6 F (36.4 C) (Oral)  Resp 20  SpO2 98%  Physical Exam  Nursing note and vitals reviewed. Constitutional: She appears well-developed and well-nourished. No distress.  HENT:  Head: Normocephalic and atraumatic.  Neck: Neck supple.  Cardiovascular: Normal rate and regular rhythm.   Pulmonary/Chest: Effort normal. No respiratory distress. She has wheezes. She has no rales.  Abdominal: Soft. She exhibits no distension. There is no tenderness. There is no rebound and no guarding.  Neurological: She is alert.  Skin: She is not diaphoretic.    ED Course  Procedures (including critical care time)  DIAGNOSTIC STUDIES: Oxygen Saturation is 98% on room air, normal by my interpretation.    COORDINATION OF CARE:  11:35 PM- Discussed treatment plan with patient, and the patient agreed to the plan.   Labs Review Labs Reviewed  CBC WITH DIFFERENTIAL - Abnormal; Notable for the following:    WBC 11.3 (*)    RBC 3.67 (*)    Hemoglobin 9.8 (*)    HCT 28.5 (*)    MCV 77.7 (*)    RDW 15.6 (*)    Neutro Abs 8.7 (*)    Monocytes Absolute 1.2 (*)    All other components within normal limits  BASIC METABOLIC PANEL - Abnormal; Notable for the following:    Sodium 127 (*)    Potassium 3.3 (*)    Chloride 88 (*)    Glucose, Bld 123 (*)    Creatinine, Ser 1.55 (*)    Calcium 10.6 (*)    GFR calc non Af Amer 35 (*)    GFR calc Af Amer 41 (*)    All other components within normal limits  POCT I-STAT, CHEM 8 - Abnormal; Notable for the following:    Sodium 131 (*)    Potassium 3.3 (*)    Chloride 94 (*)    Creatinine, Ser 1.70 (*)    Glucose, Bld 121 (*)    Calcium, Ion 1.33 (*)    Hemoglobin 9.5 (*)    HCT 28.0 (*)    All other components  within normal limits   Imaging Review Dg Chest 2 View  05/30/2013   CLINICAL DATA:  Nonproductive cough and nasal congestion. History of sarcoidosis.  EXAM: CHEST  2 VIEW  COMPARISON:  Chest radiograph from 02/06/2013  FINDINGS: The lungs are well-aerated. Vascular congestion is noted. Chronically increased interstitial markings are seen. There is no evidence of pleural effusion or pneumothorax.  The heart is normal in size; the mediastinal contour is within normal limits. No acute osseous abnormalities are seen.  IMPRESSION: Mild chronic lung changes noted, with underlying vascular congestion. No definite superimposed focal airspace consolidation seen.   Electronically Signed   By: Garald Balding M.D.   On: 05/30/2013 23:10    EKG Interpretation   None      4:22 AM Discussed Patient and labs with Dr Sharol Given.   MDM   1. Bronchitis   2. Hyponatremia    Pt with cough, myalgias, generalized weakness x 1 week.  Wheezing on exam, improved with nebs.  Pt d/c home with tussionex, albuterol hfa, prednisone.  IVF given in ED.  Pt resting comfortably.  Afebrile, nontoxic.  Pt anemic at baseline, hyponatremic.  Renal function is baseline.  Advised close PCP follow up this week.  Discussed  result, findings, treatment, and follow up  with patient.  Pt given return precautions.  Pt verbalizes understanding and agrees with plan.      I personally performed the services described in this documentation, which was scribed in my presence. The recorded information has been reviewed and is accurate.    Clayton Bibles, PA-C 05/31/13 [phone]

## 2013-05-30 NOTE — ED Notes (Signed)
Per pt, cough congestion and weakness x 1 week.  Unknown for fever at home. Not taking meds at home.  Cough is dry.

## 2013-05-31 LAB — POCT I-STAT, CHEM 8
BUN: 19 mg/dL (ref 6–23)
Calcium, Ion: 1.33 mmol/L — ABNORMAL HIGH (ref 1.13–1.30)
Chloride: 94 mEq/L — ABNORMAL LOW (ref 96–112)
Creatinine, Ser: 1.7 mg/dL — ABNORMAL HIGH (ref 0.50–1.10)
Glucose, Bld: 121 mg/dL — ABNORMAL HIGH (ref 70–99)
HCT: 28 % — ABNORMAL LOW (ref 36.0–46.0)
Hemoglobin: 9.5 g/dL — ABNORMAL LOW (ref 12.0–15.0)
Potassium: 3.3 mEq/L — ABNORMAL LOW (ref 3.5–5.1)
Sodium: 131 mEq/L — ABNORMAL LOW (ref 135–145)
TCO2: 22 mmol/L (ref 0–100)

## 2013-05-31 LAB — BASIC METABOLIC PANEL
BUN: 20 mg/dL (ref 6–23)
Chloride: 88 mEq/L — ABNORMAL LOW (ref 96–112)
Creatinine, Ser: 1.55 mg/dL — ABNORMAL HIGH (ref 0.50–1.10)
GFR calc non Af Amer: 35 mL/min — ABNORMAL LOW (ref >90)
Glucose, Bld: 123 mg/dL — ABNORMAL HIGH (ref 70–99)
Potassium: 3.3 mEq/L — ABNORMAL LOW (ref 3.5–5.1)
Sodium: 127 mEq/L — ABNORMAL LOW (ref 135–145)

## 2013-05-31 LAB — CBC WITH DIFFERENTIAL/PLATELET
Eosinophils Absolute: 0 10*3/uL (ref 0.0–0.7)
HCT: 28.5 % — ABNORMAL LOW (ref 36.0–46.0)
Hemoglobin: 9.8 g/dL — ABNORMAL LOW (ref 12.0–15.0)
Lymphs Abs: 1.3 10*3/uL (ref 0.7–4.0)
MCH: 26.7 pg (ref 26.0–34.0)
MCHC: 34.4 g/dL (ref 30.0–36.0)
Monocytes Absolute: 1.2 10*3/uL — ABNORMAL HIGH (ref 0.1–1.0)
Monocytes Relative: 11 % (ref 3–12)
Neutro Abs: 8.7 10*3/uL — ABNORMAL HIGH (ref 1.7–7.7)
Neutrophils Relative %: 77 % (ref 43–77)
RBC: 3.67 MIL/uL — ABNORMAL LOW (ref 3.87–5.11)
RDW: 15.6 % — ABNORMAL HIGH (ref 11.5–15.5)

## 2013-05-31 MED ORDER — PREDNISONE (PAK) 10 MG PO TABS: ORAL_TABLET | Freq: Every day | ORAL | Status: DC

## 2013-05-31 MED ORDER — ALBUTEROL SULFATE (5 MG/ML) 0.5% IN NEBU
5.0000 mg | INHALATION_SOLUTION | Freq: Once | RESPIRATORY_TRACT | Status: AC
  Administered 2013-05-31: 5 mg via RESPIRATORY_TRACT
  Filled 2013-05-31: qty 1

## 2013-05-31 MED ORDER — SODIUM CHLORIDE 0.9 % IV BOLUS (SEPSIS)
1000.0000 mL | Freq: Once | INTRAVENOUS | Status: AC
  Administered 2013-05-31: 1000 mL via INTRAVENOUS

## 2013-05-31 MED ORDER — ALBUTEROL SULFATE HFA 108 (90 BASE) MCG/ACT IN AERS: 1.0000 | INHALATION_SPRAY | Freq: Four times a day (QID) | RESPIRATORY_TRACT | Status: DC | PRN

## 2013-05-31 MED ORDER — HYDROCOD POLST-CHLORPHEN POLST 10-8 MG/5ML PO LQCR: 5.0000 mL | Freq: Two times a day (BID) | ORAL | Status: DC | PRN

## 2013-05-31 NOTE — ED Notes (Signed)
Pt has been d/c'd however pt states she is very sleepy and concerned about driving herself home.  Pt reports that there is no one else that can pick her up.  Spoke w/ Raquel Sarna, PA and it was decided that pt should stay in ED until she feels awake enough to safely drive home.

## 2013-05-31 NOTE — Assessment & Plan Note (Signed)
Clinical remission/control on low-dose prednisone. We discussed tapering off and stopping prednisone, with potential adrenal insufficiency or relapse.  Plan- Taper off of prednisone. Schedule PFT

## 2013-05-31 NOTE — ED Provider Notes (Signed)
Medical screening examination/treatment/procedure(s) were performed by non-physician practitioner and as supervising physician I was immediately available for consultation/collaboration.    Kalman Drape, MD 05/31/13 [phone]

## 2013-06-03 ENCOUNTER — Observation Stay (HOSPITAL_COMMUNITY): Payer: Managed Care, Other (non HMO)

## 2013-06-03 ENCOUNTER — Encounter (HOSPITAL_COMMUNITY): Payer: Self-pay | Admitting: Emergency Medicine

## 2013-06-03 ENCOUNTER — Inpatient Hospital Stay (HOSPITAL_COMMUNITY)
Admission: EM | Admit: 2013-06-03 | Discharge: 2013-06-08 | DRG: 641 | Disposition: A | Discharge status: Home Health | Payer: Managed Care, Other (non HMO) | Attending: Internal Medicine | Admitting: Internal Medicine

## 2013-06-03 ENCOUNTER — Other Ambulatory Visit (INDEPENDENT_AMBULATORY_CARE_PROVIDER_SITE_OTHER): Payer: Managed Care, Other (non HMO)

## 2013-06-03 ENCOUNTER — Encounter: Payer: Self-pay | Admitting: Internal Medicine

## 2013-06-03 ENCOUNTER — Ambulatory Visit (INDEPENDENT_AMBULATORY_CARE_PROVIDER_SITE_OTHER): Payer: Managed Care, Other (non HMO) | Admitting: Internal Medicine

## 2013-06-03 VITALS — BP 112/80 | HR 75 | Temp 98.1°F | Ht 66.0 in | Wt 244.4 lb

## 2013-06-03 DIAGNOSIS — R5383 Other fatigue: Secondary | ICD-10-CM

## 2013-06-03 DIAGNOSIS — D869 Sarcoidosis, unspecified: Secondary | ICD-10-CM | POA: Diagnosis present

## 2013-06-03 DIAGNOSIS — R42 Dizziness and giddiness: Secondary | ICD-10-CM

## 2013-06-03 DIAGNOSIS — R5381 Other malaise: Secondary | ICD-10-CM | POA: Diagnosis present

## 2013-06-03 DIAGNOSIS — R531 Weakness: Secondary | ICD-10-CM

## 2013-06-03 DIAGNOSIS — M25569 Pain in unspecified knee: Secondary | ICD-10-CM | POA: Diagnosis present

## 2013-06-03 DIAGNOSIS — E139 Other specified diabetes mellitus without complications: Secondary | ICD-10-CM

## 2013-06-03 DIAGNOSIS — N390 Urinary tract infection, site not specified: Secondary | ICD-10-CM | POA: Diagnosis present

## 2013-06-03 DIAGNOSIS — Z6839 Body mass index (BMI) 39.0-39.9, adult: Secondary | ICD-10-CM

## 2013-06-03 DIAGNOSIS — F329 Major depressive disorder, single episode, unspecified: Secondary | ICD-10-CM | POA: Diagnosis present

## 2013-06-03 DIAGNOSIS — D638 Anemia in other chronic diseases classified elsewhere: Secondary | ICD-10-CM | POA: Diagnosis present

## 2013-06-03 DIAGNOSIS — N179 Acute kidney failure, unspecified: Secondary | ICD-10-CM

## 2013-06-03 DIAGNOSIS — F32A Depression, unspecified: Secondary | ICD-10-CM | POA: Diagnosis present

## 2013-06-03 DIAGNOSIS — I10 Essential (primary) hypertension: Secondary | ICD-10-CM | POA: Diagnosis present

## 2013-06-03 DIAGNOSIS — E099 Drug or chemical induced diabetes mellitus without complications: Secondary | ICD-10-CM

## 2013-06-03 DIAGNOSIS — R609 Edema, unspecified: Secondary | ICD-10-CM | POA: Diagnosis present

## 2013-06-03 DIAGNOSIS — N184 Chronic kidney disease, stage 4 (severe): Secondary | ICD-10-CM | POA: Diagnosis present

## 2013-06-03 DIAGNOSIS — N189 Chronic kidney disease, unspecified: Secondary | ICD-10-CM | POA: Diagnosis present

## 2013-06-03 DIAGNOSIS — T380X5A Adverse effect of glucocorticoids and synthetic analogues, initial encounter: Secondary | ICD-10-CM

## 2013-06-03 DIAGNOSIS — K5900 Constipation, unspecified: Secondary | ICD-10-CM | POA: Diagnosis present

## 2013-06-03 DIAGNOSIS — I129 Hypertensive chronic kidney disease with stage 1 through stage 4 chronic kidney disease, or unspecified chronic kidney disease: Secondary | ICD-10-CM | POA: Diagnosis present

## 2013-06-03 DIAGNOSIS — E119 Type 2 diabetes mellitus without complications: Secondary | ICD-10-CM | POA: Diagnosis present

## 2013-06-03 DIAGNOSIS — Z833 Family history of diabetes mellitus: Secondary | ICD-10-CM

## 2013-06-03 DIAGNOSIS — D509 Iron deficiency anemia, unspecified: Secondary | ICD-10-CM | POA: Diagnosis present

## 2013-06-03 DIAGNOSIS — E785 Hyperlipidemia, unspecified: Secondary | ICD-10-CM | POA: Diagnosis present

## 2013-06-03 DIAGNOSIS — E871 Hypo-osmolality and hyponatremia: Secondary | ICD-10-CM

## 2013-06-03 DIAGNOSIS — Z79899 Other long term (current) drug therapy: Secondary | ICD-10-CM

## 2013-06-03 DIAGNOSIS — T502X5A Adverse effect of carbonic-anhydrase inhibitors, benzothiadiazides and other diuretics, initial encounter: Secondary | ICD-10-CM | POA: Diagnosis present

## 2013-06-03 DIAGNOSIS — D72829 Elevated white blood cell count, unspecified: Secondary | ICD-10-CM | POA: Diagnosis present

## 2013-06-03 DIAGNOSIS — F3289 Other specified depressive episodes: Secondary | ICD-10-CM | POA: Diagnosis present

## 2013-06-03 DIAGNOSIS — Z7982 Long term (current) use of aspirin: Secondary | ICD-10-CM

## 2013-06-03 DIAGNOSIS — M129 Arthropathy, unspecified: Secondary | ICD-10-CM | POA: Diagnosis present

## 2013-06-03 DIAGNOSIS — A498 Other bacterial infections of unspecified site: Secondary | ICD-10-CM | POA: Diagnosis present

## 2013-06-03 LAB — LIPID PANEL
Cholesterol: 111 mg/dL (ref 0–200)
HDL: 28.3 mg/dL — ABNORMAL LOW (ref >39.00)
Total CHOL/HDL Ratio: 4
Triglycerides: 321 mg/dL — ABNORMAL HIGH (ref 0.0–149.0)
VLDL: 64.2 mg/dL — ABNORMAL HIGH (ref 0.0–40.0)

## 2013-06-03 LAB — URINE MICROSCOPIC-ADD ON

## 2013-06-03 LAB — URINALYSIS, ROUTINE W REFLEX MICROSCOPIC
Bilirubin Urine: NEGATIVE
Bilirubin Urine: NEGATIVE
Ketones, ur: NEGATIVE
Ketones, ur: NEGATIVE mg/dL
Nitrite: NEGATIVE
Nitrite: NEGATIVE
Protein, ur: NEGATIVE mg/dL
Urine Glucose: NEGATIVE
Urobilinogen, UA: 0.2 (ref 0.0–1.0)
Urobilinogen, UA: 0.2 mg/dL (ref 0.0–1.0)

## 2013-06-03 LAB — CBC
HCT: 27.6 % — ABNORMAL LOW (ref 36.0–46.0)
Hemoglobin: 9.2 g/dL — ABNORMAL LOW (ref 12.0–15.0)
MCHC: 33.3 g/dL (ref 30.0–36.0)
Platelets: 460 10*3/uL — ABNORMAL HIGH (ref 150–400)
RBC: 3.55 MIL/uL — ABNORMAL LOW (ref 3.87–5.11)

## 2013-06-03 LAB — CBC WITH DIFFERENTIAL/PLATELET
Basophils Relative: 0.5 % (ref 0.0–3.0)
Eosinophils Relative: 0 % (ref 0.0–5.0)
Hemoglobin: 9.5 g/dL — ABNORMAL LOW (ref 12.0–15.0)
Lymphocytes Relative: 2.4 % — ABNORMAL LOW (ref 12.0–46.0)
Monocytes Relative: 5.3 % (ref 3.0–12.0)
Neutro Abs: 31.2 10*3/uL — ABNORMAL HIGH (ref 1.4–7.7)
Neutrophils Relative %: 91.8 % — ABNORMAL HIGH (ref 43.0–77.0)
Platelets: 469 10*3/uL — ABNORMAL HIGH (ref 150.0–400.0)
WBC: 34 10*3/uL (ref 4.5–10.5)

## 2013-06-03 LAB — HEPATIC FUNCTION PANEL
ALT: 25 U/L (ref 0–35)
AST: 16 U/L (ref 0–37)
Albumin: 2.8 g/dL — ABNORMAL LOW (ref 3.5–5.2)

## 2013-06-03 LAB — COMPREHENSIVE METABOLIC PANEL
ALT: 21 U/L (ref 0–35)
Albumin: 2.6 g/dL — ABNORMAL LOW (ref 3.5–5.2)
Alkaline Phosphatase: 98 U/L (ref 39–117)
BUN: 40 mg/dL — ABNORMAL HIGH (ref 6–23)
CO2: 21 mEq/L (ref 19–32)
Creatinine, Ser: 2.08 mg/dL — ABNORMAL HIGH (ref 0.50–1.10)
GFR calc Af Amer: 29 mL/min — ABNORMAL LOW (ref >90)
GFR calc non Af Amer: 25 mL/min — ABNORMAL LOW (ref >90)
Glucose, Bld: 268 mg/dL — ABNORMAL HIGH (ref 70–99)
Potassium: 4.2 mEq/L (ref 3.5–5.1)
Sodium: 125 mEq/L — ABNORMAL LOW (ref 135–145)
Total Bilirubin: 0.2 mg/dL — ABNORMAL LOW (ref 0.3–1.2)

## 2013-06-03 LAB — BASIC METABOLIC PANEL
BUN: 34 mg/dL — ABNORMAL HIGH (ref 6–23)
Chloride: 94 mEq/L — ABNORMAL LOW (ref 96–112)
Creatinine, Ser: 2 mg/dL — ABNORMAL HIGH (ref 0.4–1.2)
Glucose, Bld: 247 mg/dL — ABNORMAL HIGH (ref 70–99)
Potassium: 4.8 mEq/L (ref 3.5–5.1)

## 2013-06-03 LAB — LDL CHOLESTEROL, DIRECT: Direct LDL: 24.5 mg/dL

## 2013-06-03 LAB — SEDIMENTATION RATE: Sed Rate: 86 mm/hr — ABNORMAL HIGH (ref 0–22)

## 2013-06-03 LAB — HEMOGLOBIN A1C: Hgb A1c MFr Bld: 6.7 % — ABNORMAL HIGH (ref 4.6–6.5)

## 2013-06-03 MED ORDER — SODIUM CHLORIDE 0.9 % IV SOLN
Freq: Once | INTRAVENOUS | Status: AC
  Administered 2013-06-03: 23:00:00 via INTRAVENOUS

## 2013-06-03 MED ORDER — SODIUM CHLORIDE 0.9 % IV SOLN
INTRAVENOUS | Status: DC
  Administered 2013-06-03: via INTRAVENOUS

## 2013-06-03 MED ORDER — ONDANSETRON HCL 4 MG/2ML IJ SOLN: 4.0000 mg | Freq: Three times a day (TID) | INTRAMUSCULAR | Status: AC | PRN

## 2013-06-03 NOTE — Progress Notes (Signed)
Subjective:    Patient ID: Tara [name], female    DOB: 30-Jul-1950, 62 y.o.   MRN: [id number]  HPI Here after seen in ER with resp infection, tx with predpack, taking and overall improved; mentioned low sodium that may need to be f/u, on chornic lasix 20 bid, then less po intake last wk. Still quite fatigued and sleepy during the day, has had increased problem getting got sleep at night, did have a rx of sonata per Dr Kaur/psychiatry.  Feels sleepy most days for over 3 mo, "prob" snores at night, lives alone, no real change in wt, did have borderline sleep apnea dx approx 4-5 yrs ago, did not seem better with cpap so stopped. No further fever , but has ongoing prob with incont at night, seems to have no warning, less problem during day but with wetting accidents every 3-4 days.  Has not seen urology. Pt denies chest pain, increased sob or doe, orthopnea, PND, increased LE swelling, palpitations, dizziness or syncope. Not checked sugars lately. Denies hyper or hypo thyroid symptoms such as voice, skin or hair change Past Medical History  Diagnosis Date  . ANKLE PAIN, LEFT 04/01/2008  . ANXIETY 04/17/2007  . COLONIC POLYPS, HX OF 08/01/2007  . CONTUSIONS, MULTIPLE 04/01/2009  . DEPRESSION 04/17/2007  . DIZZINESS 08/01/2007  . DYSPNEA 08/01/2007  . Enlargement of lymph nodes 08/13/2007  . GLUCOSE INTOLERANCE 08/01/2007  . HYPERLIPIDEMIA 08/01/2007  . HYPERSOMNIA 07/28/2008  . HYPERTENSION 04/17/2007  . JOINT EFFUSION, LEFT KNEE 06/02/2010  . Loose body in knee 04/01/2009  . Morbid obesity 04/20/2007  . OTHER DISEASES OF LUNG NOT ELSEWHERE CLASSIFIED 08/01/2007  . Pain in joint, lower leg 04/01/2009  . PERIPHERAL EDEMA 04/21/2009  . Sarcoidosis 09/25/2007  . SHOULDER PAIN, LEFT 04/01/2009  . Impaired glucose tolerance 03/23/2011  . Oligomenorrhea   . Migraines     "stopped 3-4 yr ago" (07/23/2012)  . 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 Chronic kidney disease    Past Surgical History  Procedure  Laterality Date  . Myomectomy  1994  . Fracture surgery  ?02/1997    "left upper arm; put rod in" (07/23/2012)  . Refractive surgery  08/1998    "both eyes" (07/23/2012)  . Combined mediastinoscopy and bronchoscopy  08/2007  . Gum surgery  2000-?2009    "several ORs; soft tissue graft; took material from roof of mouth" (07/23/2012  . Knee arthroscopy  03/2004; 06/2009    "right; left" Dr. Theda Sers  . Lymph node biopsy  ~ 2009    "for sarcoidosis; don't know exactly which nodes" (07/23/2102)  . Combined mediastinoscopy and bronchoscopy  2009    reports that she has never smoked. She has never used smokeless tobacco. She reports that she drinks alcohol. She reports that she does not use illicit drugs. family history includes Asthma in her sister; Diabetes in her mother; Heart disease (age of onset: 32) in her father; Hypertension in her father, mother, and sister; Stroke in her mother. There is no history of Colon cancer. Allergies  Allergen Reactions  . Floxin [Ocuflox]     shaky  . Fluoxetine Hcl     Suicidal thoughts  . Sulfonamide Derivatives Rash   Current Outpatient Prescriptions on File Prior to Visit  Medication Sig Dispense Refill  . albuterol (PROVENTIL HFA;VENTOLIN HFA) 108 (90 BASE) MCG/ACT inhaler Inhale 1-2 puffs into the lungs every 6 (six) hours as needed for wheezing or shortness of breath.  1 Inhaler  0  . amphetamine-dextroamphetamine (  ADDERALL) 30 MG tablet Take 30 mg by mouth 2 (two) times daily.      Marland Kitchen aspirin 81 MG tablet Take 81 mg by mouth daily.        Marland Kitchen atenolol (TENORMIN) 50 MG tablet TAKE 1 TABLET BY MOUTH EVERY NIGHT AT BEDTIME  90 tablet  3  . chlorpheniramine-HYDROcodone (TUSSIONEX PENNKINETIC ER) 10-8 MG/5ML LQCR Take 5 mLs by mouth every 12 (twelve) hours as needed for cough.  115 mL  0  . clorazepate (TRANXENE) 7.5 MG tablet Take 7.5 mg by mouth QID.       Marland Kitchen desvenlafaxine (PRISTIQ) 50 MG 24 hr tablet Take 50 mg by mouth daily.      Marland Kitchen diltiazem (CARTIA XT) 240 MG  24 hr capsule Take 1 capsule (240 mg total) by mouth daily.  90 capsule  3  . ferrous sulfate 325 (65 FE) MG tablet Take 325 mg by mouth daily with breakfast.      . furosemide (LASIX) 20 MG tablet Take 20 mg by mouth 2 (two) times daily.       Marland Kitchen glucosamine-chondroitin 500-400 MG tablet Take 1 tablet by mouth 3 (three) times daily.      . Lancets MISC 1 application by Does not apply route 2 (two) times daily.  200 each  5  . metFORMIN (GLUCOPHAGE XR) 500 MG 24 hr tablet 1 tab by mouth every AM, except for 2 tabs in the AM for CBG > 180  180 tablet  3  . Multiple Vitamin (MULTIVITAMIN) tablet Take 1 tablet by mouth daily.      . predniSONE (STERAPRED UNI-PAK) 10 MG tablet Take by mouth daily. Day 1: take 6 tabs.  Day 2: 5 tabs  Day 3: 4 tabs  Day 4: 3 tabs  Day 5: 2 tabs  Day 6: 1 tab  21 tablet  0  . valsartan (DIOVAN) 320 MG tablet Take 1 tablet (320 mg total) by mouth daily.  90 tablet  3  . zaleplon (SONATA) 10 MG capsule Take 10 mg by mouth at bedtime.       No current facility-administered medications on file prior to visit.    Review of Systems  Constitutional: Negative for unexpected weight change, or unusual diaphoresis  HENT: Negative for tinnitus.   Eyes: Negative for photophobia and visual disturbance.  Respiratory: Negative for choking and stridor.   Gastrointestinal: Negative for vomiting and blood in stool.  Genitourinary: Negative for hematuria and decreased urine volume.  Musculoskeletal: Negative for acute joint swelling Skin: Negative for color change and wound.  Neurological: Negative for tremors and numbness other than noted  Psychiatric/Behavioral: Negative for decreased concentration or  hyperactivity.       Objective:   Physical Exam BP 112/80  Pulse 75  Temp(Src) 98.1 F (36.7 C) (Oral)  Ht 5\' 6"  (1.676 m)  Wt 244 lb 6 oz (110.848 kg)  BMI 39.46 kg/m2  SpO2 97% VS noted,  Constitutional: Pt appears well-developed and well-nourished.obese  HENT: Head:  NCAT.  Right Ear: External ear normal.  Left Ear: External ear normal.  Eyes: Conjunctivae and EOM are normal. Pupils are equal, round, and reactive to light.  Neck: Normal range of motion. Neck supple.  Cardiovascular: Normal rate and regular rhythm.   Pulmonary/Chest: Effort normal and breath sounds decresased, no rales or wheezing.  Abd:  Soft, NT, non-distended, + BS Neurological: Pt is alert. Not confused  Skin: Skin is warm. No erythema.  Psychiatric: Pt behavior is normal.  Thought content normal.     Assessment & Plan:

## 2013-06-03 NOTE — ED Notes (Signed)
EKG given to EDP, Pollina, MD.

## 2013-06-03 NOTE — Assessment & Plan Note (Signed)
With pulm involvement, and steroid induced elev sugar - has pulm f/u dec 2014,  to f/u any worsening symptoms or concerns

## 2013-06-03 NOTE — Progress Notes (Signed)
Pre-visit discussion using our clinic review tool. No additional management support is needed unless otherwise documented below in the visit note.  

## 2013-06-03 NOTE — H&P (Signed)
[facility] History and Physical  Tara [name] [id number] DOB: [birth year] DOA: 06/03/2013  Referring physician: ED PCP: Cathlean Cower, MD   Chief Complaint:  Sent by PCP for abnormal labs  HPI:  62 year old obese female week history of mediastinal sarcoidosis (on off and on prednisone), diabetes mellitus, depression, arthritis, hypertension,CKD stage 2 ( follows with Dr Posey Pronto),  who was seen in the ED few days ago for URI symptoms suggestive of bronchitis and discharged on a short tapering dose of prednisone . She was found to have hyponatremia and worsening of her renal function. Patient is on chronic Lasix 20 mg twice a day for leg edema. She was seen at her PCPs office today and complained that she was feeling fatigued with body aches and chills for past 2 weeks. She also reported persistent symptom of bronchitis. She denied any chest pain, shortness of breath, orthopnea, PND, worsening leg swellings, palpitations, headache, blurred vision, dizziness, polyuria, polydipsia, change in her skin or hair. She does report urinary urgency for possible one month. Denies dysuria or bowel symptoms. Denies nausea or vomiting.   her PCP repeated blood work which showed significant leukocytosis with WBC of 34,000, acute on chronic kidney injury with creatinine of 2 and worsened hyponatremia with sodium of 125. She was then referred to the ED. Patient reports some chest congestion and cough. She also reports feeling increasingly tired with body aches and having chills. Denies any sick contacts. Reports taking a flu vaccine this season. She has chronic knee jt pain and uses walker for ambulation.  She reports taking Aleve for her joint pain until 2 weeks back and stopped upon her nephrologists advice.  Patient was Started on IV hydration and [facility] called  for admission under  observation.  Review of Systems:  Constitutional:chills,  appetite change and fatigue.  bodyaches+, Denies  fever,  diaphoresis, HEENT: Denies photophobia, eye pain, redness, hearing loss, ear pain, congestion, sore throat, rhinorrhea, sneezing, mouth sores, trouble swallowing, neck pain, neck stiffness and tinnitus.   Respiratory: cough and chest congestion, Denies SOB, DOE, chest tightness,  and wheezing.   Cardiovascular: Denies chest pain, palpitations , chronic leg swelling.  Gastrointestinal: Denies nausea, vomiting, abdominal pain, diarrhea, constipation, blood in stool and abdominal distention.  Genitourinary: urgency+, Denies dysuria,  frequency, hematuria, flank pain and difficulty urinating.  Endocrine: Denies: hot or cold intolerance, sweats,  polyuria, polydipsia. Musculoskeletal: Denies myalgias, back pain, joint swelling, arthralgias and gait problem.  Skin: Denies pallor, rash and wound.  Neurological: weakness, Denies dizziness, seizures, syncope,  light-headedness, numbness and headaches.  Psychiatric/Behavioral: reports sleep disturbance, Denies  mood changes, confusion, nervousness,  and agitation   Past Medical History  Diagnosis Date  . ANKLE PAIN, LEFT 04/01/2008  . ANXIETY 04/17/2007  . COLONIC POLYPS, HX OF 08/01/2007  . CONTUSIONS, MULTIPLE 04/01/2009  . DEPRESSION 04/17/2007  . DIZZINESS 08/01/2007  . DYSPNEA 08/01/2007  . Enlargement of lymph nodes 08/13/2007  . GLUCOSE INTOLERANCE 08/01/2007  . HYPERLIPIDEMIA 08/01/2007  . HYPERSOMNIA 07/28/2008  . HYPERTENSION 04/17/2007  . JOINT EFFUSION, LEFT KNEE 06/02/2010  . Loose body in knee 04/01/2009  . Morbid obesity 04/20/2007  . OTHER DISEASES OF LUNG NOT ELSEWHERE CLASSIFIED 08/01/2007  . Pain in joint, lower leg 04/01/2009  . PERIPHERAL EDEMA 04/21/2009  . Sarcoidosis 09/25/2007  . SHOULDER PAIN, LEFT 04/01/2009  . Impaired glucose tolerance 03/23/2011  . Oligomenorrhea   . Migraines     "stopped 3-4 yr ago" (07/23/2012)  . Hypercalcemia due to sarcoidosis  2014  . Chronic kidney disease    Past Surgical History  Procedure  Laterality Date  . Myomectomy  1994  . Fracture surgery  ?02/1997    "left upper arm; put rod in" (07/23/2012)  . Refractive surgery  08/1998    "both eyes" (07/23/2012)  . Combined mediastinoscopy and bronchoscopy  08/2007  . Gum surgery  2000-?2009    "several ORs; soft tissue graft; took material from roof of mouth" (07/23/2012  . Knee arthroscopy  03/2004; 06/2009    "right; left" Dr. Theda Sers  . Lymph node biopsy  ~ 2009    "for sarcoidosis; don't know exactly which nodes" (07/23/2102)  . Combined mediastinoscopy and bronchoscopy  2009   Social History:  reports that she has never smoked. She has never used smokeless tobacco. She reports that she drinks alcohol. She reports that she does not use illicit drugs.  Allergies  Allergen Reactions  . Floxin [Ocuflox]     shaky  . Fluoxetine Hcl     Suicidal thoughts  . Sulfonamide Derivatives Rash    Family History  Problem Relation Age of Onset  . Heart disease Father 25  . Hypertension Father   . Diabetes Mother   . Hypertension Mother   . Stroke Mother   . Hypertension Sister   . Asthma Sister   . Colon cancer Neg Hx     Prior to Admission medications   Medication Sig Start Date End Date Taking? Authorizing Provider  albuterol (PROVENTIL HFA;VENTOLIN HFA) 108 (90 BASE) MCG/ACT inhaler Inhale 1-2 puffs into the lungs every 6 (six) hours as needed for wheezing or shortness of breath. 05/31/13  Yes Clayton Bibles, PA-C  amphetamine-dextroamphetamine (ADDERALL) 30 MG tablet Take 30 mg by mouth 2 (two) times daily.   Yes Historical Provider, MD  aspirin 81 MG tablet Take 81 mg by mouth daily.     Yes Historical Provider, MD  atenolol (TENORMIN) 50 MG tablet TAKE 1 TABLET BY MOUTH EVERY NIGHT AT BEDTIME 12/15/12  Yes Biagio Borg, MD  chlorpheniramine-HYDROcodone [facility] PENNKINETIC ER) 10-8 MG/5ML LQCR Take 5 mLs by mouth every 12 (twelve) hours as needed for cough. 05/31/13  Yes Clayton Bibles, PA-C  clorazepate (TRANXENE) 7.5 MG tablet Take  7.5 mg by mouth QID.    Yes Historical Provider, MD  desvenlafaxine (PRISTIQ) 50 MG 24 hr tablet Take 50 mg by mouth daily.   Yes Historical Provider, MD  diltiazem (CARTIA XT) 240 MG 24 hr capsule Take 1 capsule (240 mg total) by mouth daily. 05/23/12  Yes Biagio Borg, MD  ferrous sulfate 325 (65 FE) MG tablet Take 325 mg by mouth daily with breakfast.   Yes Historical Provider, MD  furosemide (LASIX) 20 MG tablet Take 20 mg by mouth 2 (two) times daily.    Yes Historical Provider, MD  glucosamine-chondroitin 500-400 MG tablet Take 1 tablet by mouth 3 (three) times daily.   Yes Historical Provider, MD  Lancets MISC 1 application by Does not apply route 2 (two) times daily. 08/27/12  Yes Biagio Borg, MD  metFORMIN (GLUCOPHAGE XR) 500 MG 24 hr tablet 1 tab by mouth every AM, except for 2 tabs in the AM for CBG > 180 08/27/12  Yes Biagio Borg, MD  Multiple Vitamin (MULTIVITAMIN) tablet Take 1 tablet by mouth daily.   Yes Historical Provider, MD  predniSONE (STERAPRED UNI-PAK) 10 MG tablet Take by mouth daily. Day 1: take 6 tabs.  Day 2: 5 tabs  Day 3:  4 tabs  Day 4: 3 tabs  Day 5: 2 tabs  Day 6: 1 tab 05/31/13  Yes Clayton Bibles, PA-C  valsartan (DIOVAN) 320 MG tablet Take 1 tablet (320 mg total) by mouth daily. 07/29/12  Yes Biagio Borg, MD  zaleplon (SONATA) 10 MG capsule Take 10 mg by mouth at bedtime.   Yes Historical Provider, MD    Physical Exam:  Filed Vitals:   06/03/13 2016 06/03/13 2237  BP: 120/58 130/70  Pulse: 70 59  Temp: 97.7 F (36.5 C)   TempSrc: Oral   Resp: 18 16  SpO2: 94% 96%    Constitutional: Vital signs reviewed.  Patient is an elderly obese female in NAD HEENT: no pallor, no icterus, moist oral mucosa Cardiovascular: RRR, S1 normal, S2 normal, no MRG, Pulmonary/Chest: CTAB, no wheezes, rales, or rhonchi Abdominal: Soft. Non-tender, non-distended, bowel sounds are normal, no masses, organomegaly, or guarding present.  EXT: Warm, trace edema b/l Hematology: no  cervical adenopathy.  Neurological: A&O x3, non focal  Labs on Admission:  Basic Metabolic Panel:  Recent Labs Lab 05/31/13 0054 05/31/13 0145 06/03/13 1505 06/03/13 2113  NA 131* 127* 125* 125*  K 3.3* 3.3* 4.8 4.2  CL 94* 88* 94* 91*  CO2  --  23 24 21   GLUCOSE 121* 123* 247* 268*  BUN 19 20 34* 40*  CREATININE 1.70* 1.55* 2.0* 2.08*  CALCIUM  --  10.6* 9.8 10.2  MG  --   --   --  1.8   Liver Function Tests:  Recent Labs Lab 06/03/13 1505 06/03/13 2113  AST 16 12  ALT 25 21  ALKPHOS 86 98  BILITOT 0.5 0.2*  PROT 6.8 6.9  ALBUMIN 2.8* 2.6*   No results found for this basename: LIPASE, AMYLASE,  in the last 168 hours No results found for this basename: AMMONIA,  in the last 168 hours CBC:  Recent Labs Lab 05/31/13 0054 05/31/13 0145 06/03/13 1505 06/03/13 2113  WBC  --  11.3* 34.0 Repeated and verified X2.* 32.0*  NEUTROABS  --  8.7* 31.2*  --   HGB 9.5* 9.8* 9.5* 9.2*  HCT 28.0* 28.5* 28.5* 27.6*  MCV  --  77.7* 78.9 77.7*  PLT  --  355 469.0* 460*   Cardiac Enzymes:  Recent Labs Lab 06/03/13 2113  TROPONINI <0.30   BNP: No components found with this basename: POCBNP,  CBG: No results found for this basename: GLUCAP,  in the last 168 hours  Radiological Exams on Admission: No results found.  EKG: Normal sinus rhythm at 67, no ST-T changes  Assessment/Plan Principal Problem:   Acute on chronic kidney failure Possibly in  the setting of poor by mouth intake, lasix and ACEi inhibitor, ?NSAIDs ( although patient denies using it for past few months) Discontinue Lasix, valsartan and metformin. -has baseline stage 2 CKD. ( follows with Dr Posey Pronto) Check renal US. -Avoid NSAIDs   Active Problems:  Hyponatremia Possibly in the setting of diuretics and poor po intake. Monitor urine osm, serum Osm and FeNa.  Hold lasix and given IV hydration.     UTI (urinary tract infection) Place on rocephin empirically. Follow cx  URI symptom with  generalized malaise Still has symptoms . Will place on rocephin and azithromycin. Prn nebs and albuterol inhaler. continue Antitussives. Check flu PCR     Leucocytosis On prednisone for past few days . also has UTI. No clinical sign of sepsis. We will perform prednisone at this time. Monitor with  antibiotics.  Diabetes mellitus hold metformin. Place on SSI. A1C of 6.7    Sarcoidosis Not on any medication at present. Follow as an outpatient  Hypertension Continue Cardizem and atenolol. Holding Lasix and valsartan  Depression Switch pristiq to effexor.   B/l Leg edema  chronic and on lasix. Has low albumin as well       Pain in joint, lower leg continue glucosamine  Diet: diabetic  DVT prophylaxis: sq heparin  Code Status: full code Family Communication: none at bedside Disposition Plan: home once stable  Louellen Molder [facility] Pager [phone]  If 7PM-7AM, please contact night-coverage [email or web address] Password [facility] 06/03/2013, 11:41 PM    Total time spent: 70 minutes

## 2013-06-03 NOTE — Assessment & Plan Note (Signed)
For f/u labs, suspect due to acute on chronic low sodium related to recent decr po intake related to infectious illness

## 2013-06-03 NOTE — Assessment & Plan Note (Addendum)
Unusual for her, hopefully transient related to recent infectious illness, exam o/w benign, for labs today,  to f/u any worsening symptoms or concerns  Note:  Total time for pt hx, exam, review of record with pt in the room, determination of diagnoses and plan for further eval and tx is > 40 min, with over 50% spent in coordination and counseling of patient

## 2013-06-03 NOTE — ED Provider Notes (Signed)
CSN: [id number]     Arrival date & time 06/03/13  1956 History   First MD Initiated Contact with Patient 06/03/13 2030     Chief Complaint  Patient presents with  . abnormal labs    (Consider location/radiation/quality/duration/timing/severity/associated sxs/prior Treatment) The history is provided by the patient and medical records. No language interpreter was used.    Tara [name] is a 62 y.o. female  with a hx of anxiety, depression, hypertension, morbid obesity, peripheral edema, migraines, hypercalcemia, chronic kidney disease presents to the Emergency Department complaining of gradual, persistent, progressively worsening weakness with associated fatigue onset 2 weeks ago. She was seen Saturday night in the ER and found to be hyponatremic.  She reports that she followed-up with Dr. Wille Glaser today as recommended and was told that her sodium was still low.  He recommended admission.  Pt denies difficulties with her sodium in the past. Pt was also Dx with bronchitis and she reports that this is much improved.  Nothing makes her symptoms better or worse Pt denies fever, chills, headache, neck pain, chest pain, SOB above baseline (hx sarcoidosis), abd pain, N/V/D, syncope, dysuria.     Past Medical History  Diagnosis Date  . ANKLE PAIN, LEFT 04/01/2008  . ANXIETY 04/17/2007  . COLONIC POLYPS, HX OF 08/01/2007  . CONTUSIONS, MULTIPLE 04/01/2009  . DEPRESSION 04/17/2007  . DIZZINESS 08/01/2007  . DYSPNEA 08/01/2007  . Enlargement of lymph nodes 08/13/2007  . GLUCOSE INTOLERANCE 08/01/2007  . HYPERLIPIDEMIA 08/01/2007  . HYPERSOMNIA 07/28/2008  . HYPERTENSION 04/17/2007  . JOINT EFFUSION, LEFT KNEE 06/02/2010  . Loose body in knee 04/01/2009  . Morbid obesity 04/20/2007  . OTHER DISEASES OF LUNG NOT ELSEWHERE CLASSIFIED 08/01/2007  . Pain in joint, lower leg 04/01/2009  . PERIPHERAL EDEMA 04/21/2009  . Sarcoidosis 09/25/2007  . SHOULDER PAIN, LEFT 04/01/2009  . Impaired glucose tolerance 03/23/2011  .  Oligomenorrhea   . Migraines     "stopped 3-4 yr ago" (07/23/2012)  . 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 Chronic kidney disease    Past Surgical History  Procedure Laterality Date  . Myomectomy  1994  . Fracture surgery  ?02/1997    "left upper arm; put rod in" (07/23/2012)  . Refractive surgery  08/1998    "both eyes" (07/23/2012)  . Combined mediastinoscopy and bronchoscopy  08/2007  . Gum surgery  2000-?2009    "several ORs; soft tissue graft; took material from roof of mouth" (07/23/2012  . Knee arthroscopy  03/2004; 06/2009    "right; left" Dr. Theda Sers  . Lymph node biopsy  ~ 2009    "for sarcoidosis; don't know exactly which nodes" (07/23/2102)  . Combined mediastinoscopy and bronchoscopy  2009   Family History  Problem Relation Age of Onset  . Heart disease Father 30  . Hypertension Father   . Diabetes Mother   . Hypertension Mother   . Stroke Mother   . Hypertension Sister   . Asthma Sister   . Colon cancer Neg Hx    History  Substance Use Topics  . Smoking status: Never Smoker   . Smokeless tobacco: Never Used  . Alcohol Use: Yes     Comment: rare   OB History   Grav Para Term Preterm Abortions TAB SAB Ect Mult Living   0              Review of Systems  Constitutional: Positive for fatigue. Negative for fever, diaphoresis, appetite change and unexpected weight change.  HENT: Negative  for mouth sores.   Eyes: Negative for visual disturbance.  Respiratory: Negative for cough, chest tightness, shortness of breath and wheezing.   Cardiovascular: Negative for chest pain.  Gastrointestinal: Negative for nausea, vomiting, abdominal pain, diarrhea and constipation.  Endocrine: Negative for polydipsia, polyphagia and polyuria.  Genitourinary: Negative for dysuria, urgency, frequency and hematuria.  Musculoskeletal: Negative for back pain and neck stiffness.  Skin: Negative for rash.  Allergic/Immunologic: Negative for immunocompromised state.  Neurological: Positive  for light-headedness. Negative for syncope and headaches.  Hematological: Does not bruise/bleed easily.  Psychiatric/Behavioral: Negative for sleep disturbance. The patient is not nervous/anxious.     Allergies  Floxin; Fluoxetine hcl; and Sulfonamide derivatives  Home Medications   Current Outpatient Rx  Name  Route  Sig  Dispense  Refill  . albuterol (PROVENTIL HFA;VENTOLIN HFA) 108 (90 BASE) MCG/ACT inhaler   Inhalation   Inhale 1-2 puffs into the lungs every 6 (six) hours as needed for wheezing or shortness of breath.   1 Inhaler   0   . amphetamine-dextroamphetamine (ADDERALL) 30 MG tablet   Oral   Take 30 mg by mouth 2 (two) times daily.         Marland Kitchen aspirin 81 MG tablet   Oral   Take 81 mg by mouth daily.           Marland Kitchen atenolol (TENORMIN) 50 MG tablet      TAKE 1 TABLET BY MOUTH EVERY NIGHT AT BEDTIME   90 tablet   3   . chlorpheniramine-HYDROcodone (TUSSIONEX PENNKINETIC ER) 10-8 MG/5ML LQCR   Oral   Take 5 mLs by mouth every 12 (twelve) hours as needed for cough.   115 mL   0   . clorazepate (TRANXENE) 7.5 MG tablet   Oral   Take 7.5 mg by mouth QID.          Marland Kitchen desvenlafaxine (PRISTIQ) 50 MG 24 hr tablet   Oral   Take 50 mg by mouth daily.         Marland Kitchen diltiazem (CARTIA XT) 240 MG 24 hr capsule   Oral   Take 1 capsule (240 mg total) by mouth daily.   90 capsule   3   . ferrous sulfate 325 (65 FE) MG tablet   Oral   Take 325 mg by mouth daily with breakfast.         . furosemide (LASIX) 20 MG tablet   Oral   Take 20 mg by mouth 2 (two) times daily.          Marland Kitchen glucosamine-chondroitin 500-400 MG tablet   Oral   Take 1 tablet by mouth 3 (three) times daily.         . Lancets MISC   Does not apply   1 application by Does not apply route 2 (two) times daily.   200 each   5   . metFORMIN (GLUCOPHAGE XR) 500 MG 24 hr tablet      1 tab by mouth every AM, except for 2 tabs in the AM for CBG > 180   180 tablet   3   . Multiple Vitamin  (MULTIVITAMIN) tablet   Oral   Take 1 tablet by mouth daily.         . predniSONE (STERAPRED UNI-PAK) 10 MG tablet   Oral   Take by mouth daily. Day 1: take 6 tabs.  Day 2: 5 tabs  Day 3: 4 tabs  Day 4: 3 tabs  Day 5:  2 tabs  Day 6: 1 tab   21 tablet   0   . valsartan (DIOVAN) 320 MG tablet   Oral   Take 1 tablet (320 mg total) by mouth daily.   90 tablet   3   . zaleplon (SONATA) 10 MG capsule   Oral   Take 10 mg by mouth at bedtime.          BP 122/66  Pulse 59  Temp(Src) 97.7 F (36.5 C) (Oral)  Resp 18  SpO2 97% Physical Exam  Nursing note and vitals reviewed. Constitutional: She appears well-developed and well-nourished. No distress.  Awake, alert, nontoxic appearance  HENT:  Head: Normocephalic and atraumatic.  Mouth/Throat: Oropharynx is clear and moist. No oropharyngeal exudate.  Eyes: Conjunctivae and EOM are normal. Pupils are equal, round, and reactive to light. No scleral icterus.  Neck: Normal range of motion. Neck supple.  Cardiovascular: Normal rate, regular rhythm, normal heart sounds and intact distal pulses.   No murmur heard. Pulmonary/Chest: Effort normal and breath sounds normal. No respiratory distress. She has no wheezes.  Abdominal: Soft. Bowel sounds are normal. She exhibits no distension. There is no tenderness. There is no rebound.  Musculoskeletal: Normal range of motion. She exhibits no edema and no tenderness.  Neurological: She is alert. She exhibits normal muscle tone. Coordination normal.  Speech is clear and goal oriented Moves extremities without ataxia  Skin: Skin is warm and dry. No rash noted. She is not diaphoretic. No erythema. There is pallor.  Psychiatric: She has a normal mood and affect. Her behavior is normal.    ED Course  Procedures (including critical care time) Labs Review Labs Reviewed  CBC - Abnormal; Notable for the following:    WBC 32.0 (*)    RBC 3.55 (*)    Hemoglobin 9.2 (*)    HCT 27.6 (*)    MCV  77.7 (*)    MCH 25.9 (*)    RDW 16.6 (*)    Platelets 460 (*)    All other components within normal limits  COMPREHENSIVE METABOLIC PANEL - Abnormal; Notable for the following:    Sodium 125 (*)    Chloride 91 (*)    Glucose, Bld 268 (*)    BUN 40 (*)    Creatinine, Ser 2.08 (*)    Albumin 2.6 (*)    Total Bilirubin 0.2 (*)    GFR calc non Af Amer 25 (*)    GFR calc Af Amer 29 (*)    All other components within normal limits  URINALYSIS, ROUTINE W REFLEX MICROSCOPIC - Abnormal; Notable for the following:    APPearance CLOUDY (*)    Hgb urine dipstick SMALL (*)    Leukocytes, UA MODERATE (*)    All other components within normal limits  URINE MICROSCOPIC-ADD ON - Abnormal; Notable for the following:    Bacteria, UA FEW (*)    All other components within normal limits  URINE CULTURE  TROPONIN I  MAGNESIUM  SODIUM, URINE, RANDOM  CREATININE, URINE, RANDOM  URIC ACID  OSMOLALITY, URINE  OSMOLALITY  INFLUENZA PANEL BY PCR  CREATININE, URINE, RANDOM  SODIUM, URINE, RANDOM  OSMOLALITY, URINE   Imaging Review No results found.  EKG Interpretation    Date/Time:  Wednesday June 03 2013 21:01:09 EST Ventricular Rate:  67 PR Interval:  166 QRS Duration: 95 QT Interval:  394 QTC Calculation: 416 R Axis:   29 Text Interpretation:  Sinus rhythm Normal ECG Confirmed by [facility]  MD, CHRISTOPHER [phone]) on 06/03/2013 9:06:24 PM            MDM   1. Hyponatremia   2. UTI (urinary tract infection)   3. Acute on chronic kidney failure   4. Leucocytosis   5. Sarcoidosis   6. DIZZINESS   7. Weakness      Tara [name] presents from primary care office with reports of abnormal laboratory testing.  Review shows the patient was seen on 11/15 for nonproductive cough and diagnosed with bronchitis. At that time it was noted that she was hyponatremic. She was given IV fluids and discharged home with primary care followup.  Today patient found to be hyponatremic at 125,  hypochloremic at 91 and with increased BUN and creatinine. Patient also with significant leukocytosis of 32.0 and evidence of urinary tract infection. Her EKG is within normal limits and her troponin is negative. Magnesium within normal limits as well.    Of concern at this time is patient's trending downward of her sodium and chloride with a trending up for her to have her BUN and creatinine over the last 3 days. Patient without previous history of hyponatremia. Her leukocytosis is likely secondary to prednisone but may also be due to UTI.  Patient is afebrile and not tachycardic. No hypertension. Alert and oriented, nontoxic-appearing.  Will admit to [facility]. Patient was discussed with and seen by Dr. Betsey Holiday who agrees with admission.    Jarrett Soho Thia Olesen, PA-C 06/04/13 [phone]

## 2013-06-03 NOTE — Patient Instructions (Signed)
Please continue all other medications as before, and refills have been done if requested. Hopefully the fatigue is due your recent illness No new medications are needed today Please go to the LAB in the Basement (turn left off the elevator) for the tests to be done today You will be contacted by phone if any changes need to be made immediately.  Otherwise, you will receive a letter about your results with an explanation, but please check with MyChart first.  Please keep your appointments with your specialists as you have planned  - Dr Annamaria Boots in December 2014  Please return in 6 months, or sooner if needed

## 2013-06-03 NOTE — Assessment & Plan Note (Signed)
stable overall by history and exam, recent data reviewed with pt, and pt to continue medical treatment as before,  to f/u any worsening symptoms or concerns BP Readings from Last 3 Encounters:  06/03/13 112/80  05/31/13 114/51  05/22/13 140/64

## 2013-06-03 NOTE — ED Notes (Signed)
Pt states she was seen here Saturday and was told her sodium level was low  Pt states she followed up today with Dr [name] and had labs repeated and was told her sodium level was still low and return here for treatment

## 2013-06-04 ENCOUNTER — Encounter (HOSPITAL_COMMUNITY): Payer: Self-pay

## 2013-06-04 LAB — GLUCOSE, CAPILLARY
Glucose-Capillary: 139 mg/dL — ABNORMAL HIGH (ref 70–99)
Glucose-Capillary: 136 mg/dL — ABNORMAL HIGH (ref 70–99)
Glucose-Capillary: 188 mg/dL — ABNORMAL HIGH (ref 70–99)
Glucose-Capillary: 178 mg/dL — ABNORMAL HIGH (ref 70–99)
Glucose-Capillary: 123 mg/dL — ABNORMAL HIGH (ref 70–99)

## 2013-06-04 LAB — CBC
MCH: 26.5 pg (ref 26.0–34.0)
MCV: 78.3 fL (ref 78.0–100.0)
Platelets: 442 10*3/uL — ABNORMAL HIGH (ref 150–400)
RBC: 3.55 MIL/uL — ABNORMAL LOW (ref 3.87–5.11)
RDW: 16.8 % — ABNORMAL HIGH (ref 11.5–15.5)
WBC: 24.8 10*3/uL — ABNORMAL HIGH (ref 4.0–10.5)

## 2013-06-04 LAB — BASIC METABOLIC PANEL
CO2: 22 mEq/L (ref 19–32)
Calcium: 9.6 mg/dL (ref 8.4–10.5)
Creatinine, Ser: 2.22 mg/dL — ABNORMAL HIGH (ref 0.50–1.10)
GFR calc Af Amer: 27 mL/min — ABNORMAL LOW (ref >90)
Glucose, Bld: 182 mg/dL — ABNORMAL HIGH (ref 70–99)
Potassium: 4 mEq/L (ref 3.5–5.1)
Sodium: 127 mEq/L — ABNORMAL LOW (ref 135–145)

## 2013-06-04 LAB — OSMOLALITY: Osmolality: 287 mOsm/kg (ref 275–300)

## 2013-06-04 LAB — URIC ACID: Uric Acid, Serum: 8.5 mg/dL — ABNORMAL HIGH (ref 2.4–7.0)

## 2013-06-04 LAB — INFLUENZA PANEL BY PCR (TYPE A & B)
H1N1 flu by pcr: NOT DETECTED
Influenza B By PCR: NEGATIVE

## 2013-06-04 LAB — OSMOLALITY, URINE: Osmolality, Ur: 188 mOsm/kg — ABNORMAL LOW (ref 390–1090)

## 2013-06-04 MED ORDER — GUAIFENESIN-DM 100-10 MG/5ML PO SYRP: 5.0000 mL | ORAL_SOLUTION | ORAL | Status: DC | PRN

## 2013-06-04 MED ORDER — INSULIN ASPART 100 UNIT/ML ~~LOC~~ SOLN
0.0000 [IU] | Freq: Three times a day (TID) | SUBCUTANEOUS | Status: DC
  Administered 2013-06-04 (×2): 3 [IU] via SUBCUTANEOUS
  Administered 2013-06-04: 2 [IU] via SUBCUTANEOUS
  Administered 2013-06-05 (×2): 3 [IU] via SUBCUTANEOUS
  Administered 2013-06-06 (×2): 2 [IU] via SUBCUTANEOUS
  Administered 2013-06-06: 8 [IU] via SUBCUTANEOUS
  Administered 2013-06-07 – 2013-06-08 (×4): 2 [IU] via SUBCUTANEOUS

## 2013-06-04 MED ORDER — SODIUM CHLORIDE 0.9 % IJ SOLN
3.0000 mL | Freq: Two times a day (BID) | INTRAMUSCULAR | Status: DC
  Administered 2013-06-05: 3 mL via INTRAVENOUS

## 2013-06-04 MED ORDER — HEPARIN SODIUM (PORCINE) 5000 UNIT/ML IJ SOLN
5000.0000 [IU] | Freq: Three times a day (TID) | INTRAMUSCULAR | Status: DC
  Administered 2013-06-04 – 2013-06-08 (×13): 5000 [IU] via SUBCUTANEOUS
  Filled 2013-06-04 (×17): qty 1

## 2013-06-04 MED ORDER — ACETAMINOPHEN 325 MG PO TABS
650.0000 mg | ORAL_TABLET | Freq: Four times a day (QID) | ORAL | Status: DC | PRN
  Administered 2013-06-04 – 2013-06-05 (×3): 650 mg via ORAL
  Filled 2013-06-04 (×3): qty 2

## 2013-06-04 MED ORDER — ZOLPIDEM TARTRATE 5 MG PO TABS: 5.0000 mg | ORAL_TABLET | Freq: Every evening | ORAL | Status: DC | PRN

## 2013-06-04 MED ORDER — VENLAFAXINE HCL ER 75 MG PO CP24
75.0000 mg | ORAL_CAPSULE | Freq: Every day | ORAL | Status: DC
  Administered 2013-06-04 – 2013-06-08 (×5): 75 mg via ORAL
  Filled 2013-06-04 (×6): qty 1

## 2013-06-04 MED ORDER — ALBUTEROL SULFATE (5 MG/ML) 0.5% IN NEBU
2.5000 mg | INHALATION_SOLUTION | Freq: Four times a day (QID) | RESPIRATORY_TRACT | Status: DC
  Administered 2013-06-04: 2.5 mg via RESPIRATORY_TRACT
  Filled 2013-06-04: qty 0.5

## 2013-06-04 MED ORDER — ATENOLOL 50 MG PO TABS
50.0000 mg | ORAL_TABLET | Freq: Every day | ORAL | Status: DC
  Administered 2013-06-04 – 2013-06-08 (×5): 50 mg via ORAL
  Filled 2013-06-04 (×5): qty 1

## 2013-06-04 MED ORDER — DILTIAZEM HCL ER 240 MG PO CP24
240.0000 mg | ORAL_CAPSULE | Freq: Every day | ORAL | Status: DC
  Administered 2013-06-04 – 2013-06-08 (×5): 240 mg via ORAL
  Filled 2013-06-04 (×5): qty 1

## 2013-06-04 MED ORDER — DEXTROSE 5 % IV SOLN
1.0000 g | Freq: Every day | INTRAVENOUS | Status: DC
  Administered 2013-06-04 – 2013-06-05 (×3): 1 g via INTRAVENOUS
  Filled 2013-06-04 (×4): qty 10

## 2013-06-04 MED ORDER — ADULT MULTIVITAMIN W/MINERALS CH
1.0000 | ORAL_TABLET | Freq: Every day | ORAL | Status: DC
  Administered 2013-06-04 – 2013-06-08 (×5): 1 via ORAL
  Filled 2013-06-04 (×5): qty 1

## 2013-06-04 MED ORDER — AZITHROMYCIN 500 MG PO TABS
500.0000 mg | ORAL_TABLET | Freq: Every day | ORAL | Status: DC
  Administered 2013-06-04 – 2013-06-08 (×5): 500 mg via ORAL
  Filled 2013-06-04 (×5): qty 1

## 2013-06-04 MED ORDER — ACETAMINOPHEN 650 MG RE SUPP: 650.0000 mg | Freq: Four times a day (QID) | RECTAL | Status: DC | PRN

## 2013-06-04 MED ORDER — PNEUMOCOCCAL VAC POLYVALENT 25 MCG/0.5ML IJ INJ
0.5000 mL | INJECTION | INTRAMUSCULAR | Status: AC
  Administered 2013-06-05: 0.5 mL via INTRAMUSCULAR
  Filled 2013-06-04 (×2): qty 0.5

## 2013-06-04 MED ORDER — GLUCOSAMINE-CHONDROITIN 500-400 MG PO TABS: 1.0000 | ORAL_TABLET | Freq: Three times a day (TID) | ORAL | Status: DC

## 2013-06-04 MED ORDER — CLORAZEPATE DIPOTASSIUM 7.5 MG PO TABS
7.5000 mg | ORAL_TABLET | Freq: Four times a day (QID) | ORAL | Status: DC
  Administered 2013-06-04 – 2013-06-08 (×17): 7.5 mg via ORAL
  Filled 2013-06-04 (×17): qty 1

## 2013-06-04 MED ORDER — FERROUS SULFATE 325 (65 FE) MG PO TABS
325.0000 mg | ORAL_TABLET | Freq: Every day | ORAL | Status: DC
  Administered 2013-06-04 – 2013-06-08 (×5): 325 mg via ORAL
  Filled 2013-06-04 (×6): qty 1

## 2013-06-04 MED ORDER — ALBUTEROL SULFATE HFA 108 (90 BASE) MCG/ACT IN AERS
2.0000 | INHALATION_SPRAY | Freq: Four times a day (QID) | RESPIRATORY_TRACT | Status: DC
  Filled 2013-06-04: qty 6.7

## 2013-06-04 MED ORDER — SODIUM CHLORIDE 0.9 % IV SOLN
INTRAVENOUS | Status: DC
  Administered 2013-06-05 – 2013-06-06 (×5): via INTRAVENOUS
  Administered 2013-06-07: 100 mL via INTRAVENOUS
  Administered 2013-06-07: 03:00:00 via INTRAVENOUS

## 2013-06-04 MED ORDER — ALBUTEROL SULFATE (5 MG/ML) 0.5% IN NEBU
2.5000 mg | INHALATION_SOLUTION | Freq: Four times a day (QID) | RESPIRATORY_TRACT | Status: DC
  Administered 2013-06-04 (×2): 2.5 mg via RESPIRATORY_TRACT
  Filled 2013-06-04 (×2): qty 0.5

## 2013-06-04 NOTE — Progress Notes (Signed)
[facility] PROGRESS NOTE  Tara [name] [id number] DOB: 24-Aug-1950 DOA: 06/03/2013  PCP: Cathlean Cower, MD  Brief HPI: 62 year old obese female with history of mediastinal sarcoidosis (on, off and on prednisone), diabetes mellitus, depression, arthritis, hypertension,CKD stage 2 ( follows with Dr Posey Pronto), who was seen in the ED few days prior to this admission for URI symptoms suggestive of bronchitis and discharged on a short tapering dose of prednisone. She was found to have hyponatremia and worsening of her renal function. Patient is on chronic Lasix 20 mg twice a day for leg edema. She was seen at her PCPs office on day of admission and complained that she was feeling fatigued with body aches and chills for past 2 weeks. She also reported persistent symptom of bronchitis. She denied any chest pain, shortness of breath, orthopnea, PND, worsening leg swellings, palpitations, headache, blurred vision, dizziness, polyuria, polydipsia, change in her skin or hair. She does report urinary urgency for possible one month. Denied dysuria or bowel symptoms. Denied nausea or vomiting. She was found to have low sodium levels with elevated creatinine and was referred for admission.  Past medical history:  Past Medical History  Diagnosis Date  . ANKLE PAIN, LEFT 04/01/2008  . ANXIETY 04/17/2007  . COLONIC POLYPS, HX OF 08/01/2007  . CONTUSIONS, MULTIPLE 04/01/2009  . DEPRESSION 04/17/2007  . DIZZINESS 08/01/2007  . DYSPNEA 08/01/2007  . Enlargement of lymph nodes 08/13/2007  . GLUCOSE INTOLERANCE 08/01/2007  . HYPERLIPIDEMIA 08/01/2007  . HYPERSOMNIA 07/28/2008  . HYPERTENSION 04/17/2007  . JOINT EFFUSION, LEFT KNEE 06/02/2010  . Loose body in knee 04/01/2009  . Morbid obesity 04/20/2007  . OTHER DISEASES OF LUNG NOT ELSEWHERE CLASSIFIED 08/01/2007  . Pain in joint, lower leg 04/01/2009  . PERIPHERAL EDEMA 04/21/2009  . Sarcoidosis 09/25/2007  . SHOULDER PAIN, LEFT 04/01/2009  . Impaired glucose tolerance  03/23/2011  . Oligomenorrhea   . Migraines     "stopped 3-4 yr ago" (07/23/2012)  . 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 Chronic kidney disease     Consultants: None  Procedures: None  Antibiotics: Ceftriaxone 11/19 Azithromycin 11/19  Subjective: Patient feels slightly better. Denies any nausea or vomiting. Denies headaches. Denies chest pain. Cough is present on and off but is dry. Unable to state when she was started on Lasix. Not a good historian.  Objective: Vital Signs  Filed Vitals:   06/03/13 2358 06/04/13 0103 06/04/13 0114 06/04/13 0537  BP: 122/66  172/63 152/74  Pulse:   77 80  Temp: 97.7 F (36.5 C)  97.9 F (36.6 C) 98.2 F (36.8 C)  TempSrc: Oral  Oral Oral  Resp: 18  20 18   Height:  5\' 6"  (1.676 m)    Weight:  110.8 kg (244 lb 4.3 oz)    SpO2: 97%  95% 93%    Filed Weights   06/04/13 0103  Weight: 110.8 kg (244 lb 4.3 oz)    Intake/Output from previous day: 11/19 0701 - 11/20 0700 In: 802.1 [I.V.:752.1; IV Piggyback:50] Out: 800 [Urine:800]  General appearance: alert, cooperative, appears stated age, no distress and moderately obese Head: Normocephalic, without obvious abnormality, atraumatic Eyes: conjunctivae/corneas clear. PERRL, EOM's intact. Resp: decreased air entry at bases but no obvious crackles or wheezes. Cardio: regular rate and rhythm, S1, S2 normal, no murmur, click, rub or gallop GI: soft, non-tender; bowel sounds normal; no masses,  no organomegaly Extremities: minimal edema noted bilateral LE. No calf tenderness. Pulses: 2+ and symmetric Skin: Skin color, texture, turgor normal.  No rashes or lesions Lymph nodes: Cervical, supraclavicular, and axillary nodes normal. Neurologic: No focal deficits.  Lab Results:  Basic Metabolic Panel:  Recent Labs Lab 05/31/13 0054 05/31/13 0145 06/03/13 1505 06/03/13 2113 06/04/13 0421  NA 131* 127* 125* 125* 127*  K 3.3* 3.3* 4.8 4.2 4.0  CL 94* 88* 94* 91* 92*  CO2  --  23 24  21 22   GLUCOSE 121* 123* 247* 268* 182*  BUN 19 20 34* 40* 43*  CREATININE 1.70* 1.55* 2.0* 2.08* 2.22*  CALCIUM  --  10.6* 9.8 10.2 9.6  MG  --   --   --  1.8  --    Liver Function Tests:  Recent Labs Lab 06/03/13 1505 06/03/13 2113  AST 16 12  ALT 25 21  ALKPHOS 86 98  BILITOT 0.5 0.2*  PROT 6.8 6.9  ALBUMIN 2.8* 2.6*   CBC:  Recent Labs Lab 05/31/13 0054 05/31/13 0145 06/03/13 1505 06/03/13 2113 06/04/13 0630  WBC  --  11.3* 34.0 Repeated and verified X2.* 32.0* 24.8*  NEUTROABS  --  8.7* 31.2*  --   --   HGB 9.5* 9.8* 9.5* 9.2* 9.4*  HCT 28.0* 28.5* 28.5* 27.6* 27.8*  MCV  --  77.7* 78.9 77.7* 78.3  PLT  --  355 469.0* 460* 442*   Cardiac Enzymes:  Recent Labs Lab 06/03/13 2113  TROPONINI <0.30   CBG:  Recent Labs Lab 06/04/13 0534 06/04/13 0754  GLUCAP 139* 136*    No results found for this or any previous visit (from the past 240 hour(s)).    Studies/Results: US Renal  06/04/2013   CLINICAL DATA:  Acute kidney injury  EXAM: RENAL/URINARY TRACT ULTRASOUND COMPLETE  COMPARISON:  None.  FINDINGS: Right Kidney  Length: 13 cm. Diffuse increased echogenicity. No mass or hydronephrosis seen.  Left Kidney  Length: 13 cm, relatively similar to CT 07/23/2012. Diffuse increased echogenicity. No mass or hydronephrosis seen.  Bladder  Appears normal for degree of bladder distention. Bladder volume 700 cc.  IMPRESSION: 1. Nephromegaly and medical renal disease. 2. No hydronephrosis. 3. Bladder volume 700 cc.   Electronically Signed   By: Jorje Guild M.D.   On: 06/04/2013 01:46    Medications:  Scheduled: . albuterol  2.5 mg Nebulization Q6H WA  . atenolol  50 mg Oral Daily  . azithromycin  500 mg Oral Daily  . cefTRIAXone (ROCEPHIN)  IV  1 g Intravenous QHS  . clorazepate  7.5 mg Oral Q6H  . diltiazem  240 mg Oral Daily  . ferrous sulfate  325 mg Oral Q breakfast  . heparin  5,000 Units Subcutaneous Q8H  . insulin aspart  0-15 Units Subcutaneous TID  WC  . multivitamin with minerals  1 tablet Oral Daily  . [START ON 06/05/2013] pneumococcal 23 valent vaccine  0.5 mL Intramuscular Tomorrow-1000  . sodium chloride  3 mL Intravenous Q12H  . venlafaxine XR  75 mg Oral Q breakfast   Continuous: . sodium chloride     [id number], acetaminophen, guaiFENesin-dextromethorphan, ondansetron (ZOFRAN) IV, zolpidem  Assessment/Plan:  Principal Problem:   Acute on chronic kidney failure Active Problems:   Sarcoidosis   HYPERLIPIDEMIA   Morbid obesity   DEPRESSION   HYPERTENSION   Pain in joint, lower leg   Other malaise and fatigue   UTI (urinary tract infection)   Leucocytosis    Hyponatremia  Urine osmolality is low. SIADH is unlikely. Likely due to diuretics and dehydration from recent bronchitis. Elevation in BUN  and creatinine supports this as well. Continue IVF. Holding diuretics. Repeat labs daily.   Acute on Chronic Kidney Disease Stage 2 Continue IVF. Follow urine output. [place] report reviewed.  UTI (urinary tract infection)  Continue Ceftriaxone. Follow culture.    URI symptom with generalized malaise  Still has symptoms. Continue rocephin and azithromycin. Prn nebs and albuterol inhaler. continue Antitussives. Unlikely to be Influenza.  Leucocytosis  Could be due to Prednisone that she was on the past few days. Also has UTI. No clinical sign of sepsis.   Diabetes mellitus  Holding metformin. Continue SSI. A1C of 6.7   History of Sarcoidosis  Not on any medication at present. Follow as an outpatient   Hypertension  Continue Cardizem and atenolol. Holding Lasix and valsartan due to renal failure and hyponatremia.  Depression  Continue current medications.  B/l Leg edema  This is chronic per patient. Unclear when she was started on Lasix but could have been 4-5 months ago.   Pain in joint, lower leg  Chronic. Patient states she has had limited mobility. Denies any falls or injuries. Will have PT evaluate.    Code Status: Full Code  DVT Prophylaxis: Heparin    Family Communication: Discussed with patient  Disposition Plan: Not ready for discharge.    LOS: 1 day   [facility] Pager [phone] 06/04/2013, 9:14 AM  If 8PM-8AM, please contact night-coverage at [email or web address], password [facility]

## 2013-06-04 NOTE — Care Management Note (Addendum)
    Page 1 of 1   06/08/2013     1:37:50 PM   CARE MANAGEMENT NOTE 06/08/2013  Patient:  Tara [name], Tara [name]   Account Number:  [id number]  Date Initiated:  06/04/2013  Documentation initiated by:  Sunday Spillers  Subjective/Objective Assessment:   62 yo female admitted with ARF, hypnatremia, elevated WBC. PTA lived at home alone.     Action/Plan:   Home when stable   Anticipated DC Date:  06/06/2013   Anticipated DC Plan:  [place]  CM consult      [facility] Choice  HOME HEALTH   Choice offered to / List presented to:  C-1 Patient        [place] arranged  [place].   Status of service:  Completed, signed off Medicare Important Message given?   (If response is "NO", the following Medicare IM given date fields will be blank) Date Medicare IM given:   Date Additional Medicare IM given:    Discharge Disposition:  [place]  Per UR Regulation:  Reviewed for med. necessity/level of care/duration of stay  If discussed at [place] of Stay Meetings, dates discussed:    Comments:

## 2013-06-04 NOTE — Progress Notes (Signed)
PHARMACIST - PHYSICIAN ORDER COMMUNICATION  CONCERNING: P&T Medication Policy on Herbal Medications  DESCRIPTION:  This patient's order for: Glucosamine-chondroitin    has been noted.  This product(s) is classified as an "herbal" or natural product. Due to a lack of definitive safety studies or FDA approval, nonstandard manufacturing practices, plus the potential risk of unknown drug-drug interactions while on inpatient medications, the Pharmacy and Therapeutics Committee does not permit the use of "herbal" or natural products of this type within [facility].   ACTION TAKEN: The pharmacy department is unable to verify this order at this time and your patient has been informed of this safety policy. Please reevaluate patient's clinical condition at discharge and address if the herbal or natural product(s) should be resumed at that time.  Lawana Pai R 06/04/2013 1:10 AM

## 2013-06-04 NOTE — Evaluation (Addendum)
Physical Therapy Evaluation Patient Details Name: Tara [name] MRN: [id number] DOB: Mar 07, 1951 Today's Date: 06/04/2013 Time: [id number] PT Time Calculation (min): 36 min  PT Assessment / Plan / Recommendation History of Present Illness  62 yo female admitted with acute on chronic kidney failure. Hx of sarcoidosis, DM, chronic knee pain/instability (L knee), bil LE edema  Clinical Impression  On eval, pt required Min assist for mobility-able to ambulate ~150 feet with walker. Pt was unsteady and appeared somewhat lethargic-difficulty keeping eyes open. Recommend HHPT with supervision for OOB/mobility at this time.     PT Assessment  Patient needs continued PT services    Follow Up Recommendations  Home health PT;Supervision for mobility/OOB (depending on progress)    Does the patient have the potential to tolerate intense rehabilitation      Barriers to Discharge        Equipment Recommendations  None recommended by PT    Recommendations for Other Services     Frequency      Precautions / Restrictions Precautions Precautions: Fall Restrictions Weight Bearing Restrictions: No   Pertinent Vitals/Pain Pt denied knee pain during session      Mobility  Bed Mobility Bed Mobility: Supine to Sit Supine to Sit: 5: Supervision Transfers Transfers: Sit to Stand;Stand to Sit Sit to Stand: 4: Min guard;From bed;From toilet Stand to Sit: 4: Min guard;To toilet;To chair/3-in-1 Details for Transfer Assistance: close guard for safety.  Ambulation/Gait Ambulation/Gait Assistance: 4: Min assist Ambulation Distance (Feet): 150 Feet Assistive device: Rolling walker Ambulation/Gait Assistance Details: Assist to stabilize throughout ambulation. Unsteady. Pt reports L knee instability at baseline. Moderate cues for safety, distance from RW.  Gait Pattern: Step-through pattern;Decreased stride length    Exercises     PT Diagnosis: Difficulty walking;Abnormality of gait  PT Problem  List: Decreased mobility;Decreased strength;Decreased safety awareness PT Treatment Interventions: DME instruction;Gait training;Functional mobility training;Therapeutic activities;Therapeutic exercise;Patient/family education     PT Goals(Current goals can be found in the care plan section) Acute Rehab PT Goals Patient Stated Goal: home tomorrow PT Goal Formulation: With patient Time For Goal Achievement: 06/11/13 Potential to Achieve Goals: Good  Visit Information  Last PT Received On: 06/04/13 Assistance Needed: +1 History of Present Illness: 62 yo female admitted with acute on chronic kidney failure. Hx of sarcoidosis, DM, chronic knee pain/instability (L knee), bil LE edema       Prior Functioning  Home Living Family/patient expects to be discharged to:: Private residence Living Arrangements: Alone Available Help at Discharge: Neighbor;Available PRN/intermittently Type of Home: House Home Access: Ramped entrance Home Layout: One level Home Equipment: Walker - 2 wheels;Cane - single point Prior Function Level of Independence: Independent with assistive device(s) Communication Communication: No difficulties    Cognition  Cognition Arousal/Alertness: Lethargic Behavior During Therapy: WFL for tasks assessed/performed Overall Cognitive Status: Within Functional Limits for tasks assessed    Extremity/Trunk Assessment Upper Extremity Assessment Upper Extremity Assessment: Generalized weakness Lower Extremity Assessment Lower Extremity Assessment: Generalized weakness Cervical / Trunk Assessment Cervical / Trunk Assessment: Normal   Balance Balance Balance Assessed: Yes Dynamic Standing Balance Dynamic Standing - Balance Support: Bilateral upper extremity supported Dynamic Standing - Level of Assistance: 4: Min assist  End of Session PT - End of Session Equipment Utilized During Treatment: Gait belt Activity Tolerance: Patient tolerated treatment well Patient left:  in chair;with call bell/phone within reach  GP     Weston Anna, MPT Pager: [phone]

## 2013-06-05 LAB — BASIC METABOLIC PANEL
CO2: 20 mEq/L (ref 19–32)
Calcium: 9.2 mg/dL (ref 8.4–10.5)
Chloride: 98 mEq/L (ref 96–112)
Creatinine, Ser: 2.97 mg/dL — ABNORMAL HIGH (ref 0.50–1.10)
GFR calc Af Amer: 19 mL/min — ABNORMAL LOW (ref >90)
Glucose, Bld: 160 mg/dL — ABNORMAL HIGH (ref 70–99)
Potassium: 3.6 mEq/L (ref 3.5–5.1)
Sodium: 128 mEq/L — ABNORMAL LOW (ref 135–145)

## 2013-06-05 LAB — CBC
HCT: 25 % — ABNORMAL LOW (ref 36.0–46.0)
Hemoglobin: 8.5 g/dL — ABNORMAL LOW (ref 12.0–15.0)
MCH: 26.2 pg (ref 26.0–34.0)
MCV: 77.2 fL — ABNORMAL LOW (ref 78.0–100.0)
Platelets: 417 10*3/uL — ABNORMAL HIGH (ref 150–400)
RBC: 3.24 MIL/uL — ABNORMAL LOW (ref 3.87–5.11)
RDW: 16.8 % — ABNORMAL HIGH (ref 11.5–15.5)
WBC: 20.1 10*3/uL — ABNORMAL HIGH (ref 4.0–10.5)

## 2013-06-05 LAB — GLUCOSE, CAPILLARY
Glucose-Capillary: 108 mg/dL — ABNORMAL HIGH (ref 70–99)
Glucose-Capillary: 177 mg/dL — ABNORMAL HIGH (ref 70–99)
Glucose-Capillary: 188 mg/dL — ABNORMAL HIGH (ref 70–99)

## 2013-06-05 MED ORDER — POLYETHYLENE GLYCOL 3350 17 G PO PACK
17.0000 g | PACK | Freq: Every day | ORAL | Status: DC
  Administered 2013-06-05 – 2013-06-08 (×4): 17 g via ORAL
  Filled 2013-06-05 (×4): qty 1

## 2013-06-05 MED ORDER — ALBUTEROL SULFATE (5 MG/ML) 0.5% IN NEBU
2.5000 mg | INHALATION_SOLUTION | Freq: Three times a day (TID) | RESPIRATORY_TRACT | Status: DC
Start: 2013-06-05 — End: 2013-06-08
  Administered 2013-06-05 – 2013-06-08 (×11): 2.5 mg via RESPIRATORY_TRACT
  Filled 2013-06-05 (×11): qty 0.5

## 2013-06-05 MED ORDER — SODIUM CHLORIDE 0.9 % IV BOLUS (SEPSIS)
500.0000 mL | Freq: Once | INTRAVENOUS | Status: AC
  Administered 2013-06-05: 500 mL via INTRAVENOUS

## 2013-06-05 MED ORDER — ALBUTEROL SULFATE (5 MG/ML) 0.5% IN NEBU: 2.5000 mg | INHALATION_SOLUTION | Freq: Four times a day (QID) | RESPIRATORY_TRACT | Status: DC | PRN

## 2013-06-05 MED ORDER — DOCUSATE SODIUM 100 MG PO CAPS
100.0000 mg | ORAL_CAPSULE | Freq: Two times a day (BID) | ORAL | Status: DC
  Administered 2013-06-05 – 2013-06-08 (×6): 100 mg via ORAL
  Filled 2013-06-05 (×8): qty 1

## 2013-06-05 NOTE — Progress Notes (Signed)
[facility] PROGRESS NOTE  Tara [name] [id number] DOB: 06-01-1951 DOA: 06/03/2013  PCP: Cathlean Cower, MD  Brief HPI: 62 year old obese female with history of mediastinal sarcoidosis (on, off and on prednisone), diabetes mellitus, depression, arthritis, hypertension,CKD stage 2 ( follows with Dr Posey Pronto), who was seen in the ED few days prior to this admission for URI symptoms suggestive of bronchitis and discharged on a short tapering dose of prednisone. She was found to have hyponatremia and worsening of her renal function. Patient is on chronic Lasix 20 mg twice a day for leg edema. She was seen at her PCPs office on day of admission and complained that she was feeling fatigued with body aches and chills for past 2 weeks. She also reported persistent symptom of bronchitis. She denied any chest pain, shortness of breath, orthopnea, PND, worsening leg swellings, palpitations, headache, blurred vision, dizziness, polyuria, polydipsia, change in her skin or hair. She does report urinary urgency for possible one month. Denied dysuria or bowel symptoms. Denied nausea or vomiting. She was found to have low sodium levels with elevated creatinine and was referred for admission.  Past medical history:  Past Medical History  Diagnosis Date  . ANKLE PAIN, LEFT 04/01/2008  . ANXIETY 04/17/2007  . COLONIC POLYPS, HX OF 08/01/2007  . CONTUSIONS, MULTIPLE 04/01/2009  . DEPRESSION 04/17/2007  . DIZZINESS 08/01/2007  . DYSPNEA 08/01/2007  . Enlargement of lymph nodes 08/13/2007  . GLUCOSE INTOLERANCE 08/01/2007  . HYPERLIPIDEMIA 08/01/2007  . HYPERSOMNIA 07/28/2008  . HYPERTENSION 04/17/2007  . JOINT EFFUSION, LEFT KNEE 06/02/2010  . Loose body in knee 04/01/2009  . Morbid obesity 04/20/2007  . OTHER DISEASES OF LUNG NOT ELSEWHERE CLASSIFIED 08/01/2007  . Pain in joint, lower leg 04/01/2009  . PERIPHERAL EDEMA 04/21/2009  . Sarcoidosis 09/25/2007  . SHOULDER PAIN, LEFT 04/01/2009  . Impaired glucose tolerance  03/23/2011  . Oligomenorrhea   . Migraines     "stopped 3-4 yr ago" (07/23/2012)  . 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 Chronic kidney disease     Consultants: None  Procedures: None  Antibiotics: Ceftriaxone 11/19 Azithromycin 11/19  Subjective: Patient complains of some abdominal discomfort but denies nausea or vomiting. Says she has been constipated. Denies shortness of breath.   Objective: Vital Signs  Filed Vitals:   06/04/13 1410 06/04/13 1440 06/04/13 2122 06/05/13 0518  BP: 115/63  120/69 130/69  Pulse: 73  90 82  Temp: 98.1 F (36.7 C)  100.5 F (38.1 C) 99 F (37.2 C)  TempSrc: Oral  Oral Oral  Resp: 18  18 16   Height:      Weight:      SpO2: 94% 96% 93% 98%    Filed Weights   06/04/13 0103  Weight: 110.8 kg (244 lb 4.3 oz)    Intake/Output from previous day: 11/20 0701 - 11/21 0700 In: 3570.4 [P.O.:960; I.V.:2560.4; IV Piggyback:50] Out: 3801 [Urine:3800; Stool:1]  General appearance: alert, cooperative, appears stated age, no distress and moderately obese Resp: decreased air entry at bases but no obvious crackles or wheezes. Cardio: regular rate and rhythm, S1, S2 normal, no murmur, click, rub or gallop GI: soft, non-tender; bowel sounds normal; no masses,  no organomegaly Extremities: minimal edema noted bilateral LE. No calf tenderness. Pulses: 2+ and symmetric Neurologic: No focal deficits.  Lab Results:  Basic Metabolic Panel:  Recent Labs Lab 05/31/13 0145 06/03/13 1505 06/03/13 2113 06/04/13 0421 06/05/13 0450  NA 127* 125* 125* 127* 128*  K 3.3* 4.8 4.2 4.0 3.6  CL 88* 94* 91* 92* 98  CO2 23 24 21 22 20   GLUCOSE 123* 247* 268* 182* 160*  BUN 20 34* 40* 43* 47*  CREATININE 1.55* 2.0* 2.08* 2.22* 2.97*  CALCIUM 10.6* 9.8 10.2 9.6 9.2  MG  --   --  1.8  --   --    Liver Function Tests:  Recent Labs Lab 06/03/13 1505 06/03/13 2113  AST 16 12  ALT 25 21  ALKPHOS 86 98  BILITOT 0.5 0.2*  PROT 6.8 6.9  ALBUMIN 2.8*  2.6*   CBC:  Recent Labs Lab 05/31/13 0145 06/03/13 1505 06/03/13 2113 06/04/13 0630 06/05/13 0450  WBC 11.3* 34.0 Repeated and verified X2.* 32.0* 24.8* 20.1*  NEUTROABS 8.7* 31.2*  --   --   --   HGB 9.8* 9.5* 9.2* 9.4* 8.5*  HCT 28.5* 28.5* 27.6* 27.8* 25.0*  MCV 77.7* 78.9 77.7* 78.3 77.2*  PLT 355 469.0* 460* 442* 417*   Cardiac Enzymes:  Recent Labs Lab 06/03/13 2113  TROPONINI <0.30   CBG:  Recent Labs Lab 06/04/13 0754 06/04/13 1202 06/04/13 1634 06/04/13 2159 06/05/13 0742  GLUCAP 136* 188* 178* 123* 108*    No results found for this or any previous visit (from the past 240 hour(s)).    Studies/Results: US Renal  06/04/2013   CLINICAL DATA:  Acute kidney injury  EXAM: RENAL/URINARY TRACT ULTRASOUND COMPLETE  COMPARISON:  None.  FINDINGS: Right Kidney  Length: 13 cm. Diffuse increased echogenicity. No mass or hydronephrosis seen.  Left Kidney  Length: 13 cm, relatively similar to CT 07/23/2012. Diffuse increased echogenicity. No mass or hydronephrosis seen.  Bladder  Appears normal for degree of bladder distention. Bladder volume 700 cc.  IMPRESSION: 1. Nephromegaly and medical renal disease. 2. No hydronephrosis. 3. Bladder volume 700 cc.   Electronically Signed   By: Jorje Guild M.D.   On: 06/04/2013 01:46    Medications:  Scheduled: . albuterol  2.5 mg Nebulization TID  . atenolol  50 mg Oral Daily  . azithromycin  500 mg Oral Daily  . cefTRIAXone (ROCEPHIN)  IV  1 g Intravenous QHS  . clorazepate  7.5 mg Oral Q6H  . diltiazem  240 mg Oral Daily  . ferrous sulfate  325 mg Oral Q breakfast  . heparin  5,000 Units Subcutaneous Q8H  . insulin aspart  0-15 Units Subcutaneous TID WC  . multivitamin with minerals  1 tablet Oral Daily  . pneumococcal 23 valent vaccine  0.5 mL Intramuscular Tomorrow-1000  . sodium chloride  500 mL Intravenous Once  . sodium chloride  3 mL Intravenous Q12H  . venlafaxine XR  75 mg Oral Q breakfast   Continuous: .  sodium chloride 125 mL/hr at 06/05/13 0825   [id number], acetaminophen, albuterol, guaiFENesin-dextromethorphan, zolpidem  Assessment/Plan:  Principal Problem:   Acute on chronic kidney failure Active Problems:   Sarcoidosis   HYPERLIPIDEMIA   Morbid obesity   DEPRESSION   HYPERTENSION   Pain in joint, lower leg   Other malaise and fatigue   UTI (urinary tract infection)   Leucocytosis    Hyponatremia  Likely due to recent diuretics and dehydration from recent bronchitis. Elevation in BUN and creatinine supports this as well. Continue IVF. Holding diuretics. Repeat labs daily.   Acute on Chronic Kidney Disease Stage 2 Creatinine is worse today. She is putting out more than 3ltrs of urine. Will increase IVF. Follow urine output. [place] report reviewed. May need to involve Nephrology if no improvement in  another day.   UTI (urinary tract infection)  Continue Ceftriaxone. Culture is pending.    URI symptom with generalized malaise  Improved. Continue rocephin and azithromycin. Prn nebs and albuterol inhaler. Continue Antitussives. Influenza panel negative.  Leucocytosis  Could be due to Prednisone that she was on the past few days. Also has UTI. No clinical sign of sepsis. Improving.  Diabetes mellitus  Holding metformin. Continue SSI. A1C of 6.7   History of Sarcoidosis  Not on any medication at present. Follow as an outpatient   Hypertension  Continue Cardizem and atenolol. Holding Lasix and valsartan due to renal failure and hyponatremia.  Depression  Continue current medications.  B/l Leg edema  This is chronic per patient. Unclear when she was started on Lasix but could have been 4-5 months ago.   Pain in joint, lower leg  Chronic. Patient states she has had limited mobility. Denies any falls or injuries. Will have PT evaluate.   Code Status: Full Code  DVT Prophylaxis: Heparin    Family Communication: Discussed with patient  Disposition Plan: Not ready  for discharge. Will need Home health PT on discharge.    LOS: 2 days   [facility] Pager [phone] 06/05/2013, 9:03 AM  If 8PM-8AM, please contact night-coverage at [email or web address], password [facility]

## 2013-06-05 NOTE — Evaluation (Signed)
Occupational Therapy Evaluation Patient Details Name: Tara [name] MRN: [id number] DOB: February 26, 1951 Today's Date: 06/05/2013 Time: [id number] OT Time Calculation (min): 15 min  OT Assessment / Plan / Recommendation History of present illness 62 yo female admitted with acute on chronic kidney failure. Hx of sarcoidosis, DM, chronic knee pain/instability (L knee), bil LE edema   Clinical Impression   Pt opening and closing eyes frequently during session and states her eyes burn. Informed nursing. She appears to stare off at times but able to follow commands. Needs frequent verbal cues for safety especially with RW use as she tends to push it too far in front of her. Feel she needs initial supervision for safety at home.    OT Assessment  Patient needs continued OT Services    Follow Up Recommendations  Home health OT;Supervision/Assistance - 24 hour    Barriers to Discharge      Equipment Recommendations  3 in 1 bedside comode    Recommendations for Other Services    Frequency  Min 2X/week    Precautions / Restrictions Precautions Precautions: Fall Restrictions Weight Bearing Restrictions: No   Pertinent Vitals/Pain Pt states eyes burn; informed nursing.    ADL  Eating/Feeding: Simulated;Independent Where Assessed - Eating/Feeding: Chair Grooming: Performed;Wash/dry hands;Minimal assistance Where Assessed - Grooming: Unsupported standing Upper Body Bathing: Simulated;Chest;Right arm;Left arm;Abdomen;Supervision/safety;Set up Where Assessed - Upper Body Bathing: Unsupported sitting Lower Body Bathing: Simulated;Minimal assistance Where Assessed - Lower Body Bathing: Supported sit to stand Upper Body Dressing: Simulated;Set up;Supervision/safety Where Assessed - Upper Body Dressing: Unsupported sitting Lower Body Dressing: Simulated;Minimal assistance Where Assessed - Lower Body Dressing: Supported sit to stand Toilet Transfer: Performed;Minimal assistance Community education officer: Comfort height toilet Toileting - Clothing Manipulation and Hygiene: Simulated;Minimal assistance Where Assessed - Best boy and Hygiene: Sit to stand from 3-in-1 or toilet Equipment Used: Rolling walker ADL Comments: Pt opening and closing eyes frequently during session. When asked why, pt stating her eyes burn. Informed nursing. DIfficult to determine pt's cognitive status as she appears a little lethargic. Instructed pt to not push the walker too far in front of  her or step outside the walker but pt continued to do so despite repeated cues. ?? lethargic.     OT Diagnosis: Generalized weakness  OT Problem List: Decreased strength;Decreased safety awareness;Decreased knowledge of use of DME or AE OT Treatment Interventions: Self-care/ADL training;DME and/or AE instruction;Therapeutic activities;Patient/family education   OT Goals(Current goals can be found in the care plan section) Acute Rehab OT Goals Patient Stated Goal: none stated OT Goal Formulation: With patient Time For Goal Achievement: 06/19/13 Potential to Achieve Goals: Good  Visit Information  Last OT Received On: 06/05/13 Assistance Needed: +1 History of Present Illness: 62 yo female admitted with acute on chronic kidney failure. Hx of sarcoidosis, DM, chronic knee pain/instability (L knee), bil LE edema       Prior Functioning     Home Living Family/patient expects to be discharged to:: Private residence Living Arrangements: Alone Available Help at Discharge: Neighbor;Available PRN/intermittently Type of Home: House Home Access: Ramped entrance Home Layout: One level Home Equipment: Walker - 2 wheels;Cane - single point Prior Function Level of Independence: Independent with assistive device(s) Communication Communication: No difficulties         Vision/Perception     Cognition  Cognition Arousal/Alertness: Lethargic Behavior During Therapy: WFL for tasks  assessed/performed Overall Cognitive Status: Difficult to assess (pt somewhat lethargic)    Extremity/Trunk Assessment Upper Extremity Assessment Upper Extremity Assessment:  Overall WFL for tasks assessed     Mobility Transfers Transfers: Sit to Stand;Stand to Sit Sit to Stand: 4: Min guard;With upper extremity assist;From chair/3-in-1;From toilet Stand to Sit: 4: Min guard;With upper extremity assist;To chair/3-in-1;To toilet Details for Transfer Assistance: cues for hand placement and min guard for safety.     Exercise     Balance Balance Balance Assessed: Yes Dynamic Standing Balance Dynamic Standing - Balance Support: Bilateral upper extremity supported Dynamic Standing - Level of Assistance: 4: Min assist   End of Session OT - End of Session Equipment Utilized During Treatment: Rolling walker Activity Tolerance: Patient tolerated treatment well Patient left: in chair;with call bell/phone within reach  GO     Jules Schick [id number] 06/05/2013, 11:10 AM

## 2013-06-05 NOTE — Progress Notes (Signed)
Physical Therapy Treatment Patient Details Name: Tara [name] MRN: [id number] DOB: 1951/02/06 Today's Date: 06/05/2013 Time: [id number] PT Time Calculation (min): 16 min  PT Assessment / Plan / Recommendation  History of Present Illness 62 yo female admitted with acute on chronic kidney failure. Hx of sarcoidosis, DM, chronic knee pain/instability (L knee), bil LE edema   PT Comments   Progressing slowly with mobility. Pt remains somewhat lethargic. Recommend daily mobility with nursing,especially over weekend if pt remains in hospital  Follow Up Recommendations  Home health PT;Supervision for mobility/OOB     Does the patient have the potential to tolerate intense rehabilitation     Barriers to Discharge        Equipment Recommendations  None recommended by PT    Recommendations for Other Services OT consult  Frequency Min 3X/week   Progress towards PT Goals Progress towards PT goals: Progressing toward goals  Plan Current plan remains appropriate    Precautions / Restrictions Precautions Precautions: Fall Restrictions Weight Bearing Restrictions: No   Pertinent Vitals/Pain L knee-unrated    Mobility  Bed Mobility Bed Mobility: Not assessed Details for Bed Mobility Assistance: pt sitting in recliner Transfers Transfers: Sit to Stand;Stand to Sit Sit to Stand: 4: Min guard;From chair/3-in-1;From toilet Stand to Sit: 4: Min guard;To toilet;To chair/3-in-1 Details for Transfer Assistance: cues for hand placement and min guard for safety. Ambulation/Gait Ambulation/Gait Assistance: 4: Min guard Ambulation Distance (Feet): 300 Feet Assistive device: Rolling walker Ambulation/Gait Assistance Details: VCs safety, distance from walker. NO LOB with ambulation. Pt requires Moderated cueing for safe walker use Gait Pattern: Step-through pattern;Decreased stride length;Trunk flexed    Exercises     PT Diagnosis:    PT Problem List:   PT Treatment Interventions:     PT  Goals (current goals can now be found in the care plan section)    Visit Information  Last PT Received On: 06/05/13 Assistance Needed: +1 History of Present Illness: 62 yo female admitted with acute on chronic kidney failure. Hx of sarcoidosis, DM, chronic knee pain/instability (L knee), bil LE edema    Subjective Data      Cognition  Cognition Arousal/Alertness: Lethargic Behavior During Therapy: WFL for tasks assessed/performed Overall Cognitive Status: Difficult to assess    Balance     End of Session PT - End of Session Equipment Utilized During Treatment: Gait belt Activity Tolerance: Patient tolerated treatment well Patient left: in chair;with call bell/phone within reach   GP     Weston Anna, MPT Pager: [phone]

## 2013-06-06 DIAGNOSIS — D509 Iron deficiency anemia, unspecified: Secondary | ICD-10-CM

## 2013-06-06 LAB — BASIC METABOLIC PANEL
BUN: 41 mg/dL — ABNORMAL HIGH (ref 6–23)
Calcium: 8.7 mg/dL (ref 8.4–10.5)
GFR calc Af Amer: 20 mL/min — ABNORMAL LOW (ref >90)
GFR calc non Af Amer: 17 mL/min — ABNORMAL LOW (ref >90)
Glucose, Bld: 141 mg/dL — ABNORMAL HIGH (ref 70–99)
Potassium: 3.9 mEq/L (ref 3.5–5.1)
Sodium: 132 mEq/L — ABNORMAL LOW (ref 135–145)

## 2013-06-06 LAB — GLUCOSE, CAPILLARY
Glucose-Capillary: 140 mg/dL — ABNORMAL HIGH (ref 70–99)
Glucose-Capillary: 173 mg/dL — ABNORMAL HIGH (ref 70–99)

## 2013-06-06 LAB — CBC
MCH: 25.6 pg — ABNORMAL LOW (ref 26.0–34.0)
MCHC: 33.5 g/dL (ref 30.0–36.0)
Platelets: 403 10*3/uL — ABNORMAL HIGH (ref 150–400)
RBC: 3.08 MIL/uL — ABNORMAL LOW (ref 3.87–5.11)
RDW: 17.1 % — ABNORMAL HIGH (ref 11.5–15.5)

## 2013-06-06 LAB — URINE CULTURE

## 2013-06-06 MED ORDER — CEPHALEXIN 250 MG PO CAPS
250.0000 mg | ORAL_CAPSULE | Freq: Two times a day (BID) | ORAL | Status: DC
  Administered 2013-06-06 – 2013-06-08 (×5): 250 mg via ORAL
  Filled 2013-06-06 (×6): qty 1

## 2013-06-06 NOTE — Progress Notes (Signed)
[facility] PROGRESS NOTE  Tara [name] [id number] DOB: Mar 25, 1951 DOA: 06/03/2013  PCP: Cathlean Cower, MD  Brief HPI: 62 year old obese female with history of mediastinal sarcoidosis (on, off and on prednisone), diabetes mellitus, depression, arthritis, hypertension,CKD stage 2 ( follows with Dr Posey Pronto), who was seen in the ED few days prior to this admission for URI symptoms suggestive of bronchitis and discharged on a short tapering dose of prednisone. She was found to have hyponatremia and worsening of her renal function. Patient is on chronic Lasix 20 mg twice a day for leg edema. She was seen at her PCPs office on day of admission and complained that she was feeling fatigued with body aches and chills for past 2 weeks. She also reported persistent symptom of bronchitis. She denied any chest pain, shortness of breath, orthopnea, PND, worsening leg swellings, palpitations, headache, blurred vision, dizziness, polyuria, polydipsia, change in her skin or hair. She does report urinary urgency for possible one month. Denied dysuria or bowel symptoms. Denied nausea or vomiting. She was found to have low sodium levels with elevated creatinine and was referred for admission.  Past medical history:  Past Medical History  Diagnosis Date  . ANKLE PAIN, LEFT 04/01/2008  . ANXIETY 04/17/2007  . COLONIC POLYPS, HX OF 08/01/2007  . CONTUSIONS, MULTIPLE 04/01/2009  . DEPRESSION 04/17/2007  . DIZZINESS 08/01/2007  . DYSPNEA 08/01/2007  . Enlargement of lymph nodes 08/13/2007  . GLUCOSE INTOLERANCE 08/01/2007  . HYPERLIPIDEMIA 08/01/2007  . HYPERSOMNIA 07/28/2008  . HYPERTENSION 04/17/2007  . JOINT EFFUSION, LEFT KNEE 06/02/2010  . Loose body in knee 04/01/2009  . Morbid obesity 04/20/2007  . OTHER DISEASES OF LUNG NOT ELSEWHERE CLASSIFIED 08/01/2007  . Pain in joint, lower leg 04/01/2009  . PERIPHERAL EDEMA 04/21/2009  . Sarcoidosis 09/25/2007  . SHOULDER PAIN, LEFT 04/01/2009  . Impaired glucose tolerance  03/23/2011  . Oligomenorrhea   . Migraines     "stopped 3-4 yr ago" (07/23/2012)  . 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 Chronic kidney disease     Consultants: None  Procedures: None  Antibiotics: Ceftriaxone 11/19-->11/22 Azithromycin 11/19 Keflex 11/22-->  Subjective: Patient feels well. Denies any pain today. Denies shortness of breath.   Objective: Vital Signs  Filed Vitals:   06/06/13 0500 06/06/13 0506 06/06/13 0630 06/06/13 0945  BP: 158/74  144/70 159/76  Pulse: 88  76 80  Temp: 100.4 F (38 C)  99.9 F (37.7 C) 97.6 F (36.4 C)  TempSrc: Oral  Oral Oral  Resp: 28  26 20   Height:      Weight:      SpO2: 88% 95% 99% 99%    Filed Weights   06/04/13 0103  Weight: 110.8 kg (244 lb 4.3 oz)    Intake/Output from previous day: 11/21 0701 - 11/22 0700 In: 4403.3 [P.O.:1080; I.V.:3323.3] Out: 4051 [Urine:4050; Stool:1]  General appearance: alert, cooperative, appears stated age, no distress and moderately obese Resp: decreased air entry at bases but no obvious crackles or wheezes. Cardio: regular rate and rhythm, S1, S2 normal, no murmur, click, rub or gallop GI: soft, non-tender; bowel sounds normal; no masses,  no organomegaly Extremities: minimal edema noted bilateral LE. No calf tenderness. Pulses: 2+ and symmetric Neurologic: No focal deficits.  Lab Results:  Basic Metabolic Panel:  Recent Labs Lab 06/03/13 1505 06/03/13 2113 06/04/13 0421 06/05/13 0450 06/06/13 0513  NA 125* 125* 127* 128* 132*  K 4.8 4.2 4.0 3.6 3.9  CL 94* 91* 92* 98 102  CO2  24 21 22 20 20   GLUCOSE 247* 268* 182* 160* 141*  BUN 34* 40* 43* 47* 41*  CREATININE 2.0* 2.08* 2.22* 2.97* 2.79*  CALCIUM 9.8 10.2 9.6 9.2 8.7  MG  --  1.8  --   --   --    Liver Function Tests:  Recent Labs Lab 06/03/13 1505 06/03/13 2113  AST 16 12  ALT 25 21  ALKPHOS 86 98  BILITOT 0.5 0.2*  PROT 6.8 6.9  ALBUMIN 2.8* 2.6*   CBC:  Recent Labs Lab 05/31/13 0145  06/03/13 1505 06/03/13 2113 06/04/13 0630 06/05/13 0450 06/06/13 0513  WBC 11.3* 34.0 Repeated and verified X2.* 32.0* 24.8* 20.1* 17.4*  NEUTROABS 8.7* 31.2*  --   --   --   --   HGB 9.8* 9.5* 9.2* 9.4* 8.5* 7.9*  HCT 28.5* 28.5* 27.6* 27.8* 25.0* 23.6*  MCV 77.7* 78.9 77.7* 78.3 77.2* 76.6*  PLT 355 469.0* 460* 442* 417* 403*   Cardiac Enzymes:  Recent Labs Lab 06/03/13 2113  TROPONINI <0.30   CBG:  Recent Labs Lab 06/05/13 0742 06/05/13 1217 06/05/13 1742 06/05/13 2200 06/06/13 0722  GLUCAP 108* 169* 177* 188* 125*    Recent Results (from the past 240 hour(s))  URINE CULTURE     Status: None   Collection Time    06/03/13  9:01 PM      Result Value Range Status   Specimen Description URINE, CLEAN CATCH   Final   Special Requests NONE   Final   Culture  Setup Time     Final   Value: 06/04/2013 01:10     Performed at [place]     Final   Value: >=100,000 COLONIES/ML     Performed at [facility]   Culture     Final   Value: ESCHERICHIA COLI     Performed at [facility]   Report Status 06/06/2013 FINAL   Final   Organism ID, Bacteria ESCHERICHIA COLI   Final      Studies/Results: No results found.  Medications:  Scheduled: . albuterol  2.5 mg Nebulization TID  . atenolol  50 mg Oral Daily  . azithromycin  500 mg Oral Daily  . cefTRIAXone (ROCEPHIN)  IV  1 g Intravenous QHS  . clorazepate  7.5 mg Oral Q6H  . diltiazem  240 mg Oral Daily  . docusate sodium  100 mg Oral BID  . ferrous sulfate  325 mg Oral Q breakfast  . heparin  5,000 Units Subcutaneous Q8H  . insulin aspart  0-15 Units Subcutaneous TID WC  . multivitamin with minerals  1 tablet Oral Daily  . polyethylene glycol  17 g Oral Daily  . sodium chloride  3 mL Intravenous Q12H  . venlafaxine XR  75 mg Oral Q breakfast   Continuous: . sodium chloride 150 mL/hr at 06/06/13 0036   [id number], acetaminophen, albuterol,  guaiFENesin-dextromethorphan, zolpidem  Assessment/Plan:  Principal Problem:   Acute on chronic kidney failure Active Problems:   Sarcoidosis   HYPERLIPIDEMIA   Morbid obesity   DEPRESSION   HYPERTENSION   Pain in joint, lower leg   Other malaise and fatigue   UTI (urinary tract infection)   Leucocytosis    Hyponatremia  Improving. Likely due to recent diuretics and dehydration from recent bronchitis. Elevation in BUN and creatinine supports this as well. Continue IVF. Holding diuretics. Repeat labs daily.   Acute on Chronic Kidney Disease Stage 2 Creatinine is  better today. Continue current rate of IVF. She is putting out more than 3ltrs of urine. Follow urine output. [place] report reviewed. Can hold off on getting Nephrology input.    UTI (urinary tract infection)  Culture show E coli. Change to Keflex adjusted for renal function.   Microcytic Anemia Check Anemia panel. No overt bleeding identified. Drop in Hgb likely dilutional.  URI symptom with generalized malaise  Improved. Continue azithromycin. Prn nebs and albuterol inhaler. Continue Antitussives. Influenza panel negative.  Leucocytosis  Could be due to Prednisone that she was on the past few days. Also has UTI. No clinical sign of sepsis. Improving.  Diabetes mellitus  Holding metformin. Continue SSI. A1C of 6.7   History of Sarcoidosis  Not on any medication at present. Follow as an outpatient   Hypertension  Continue Cardizem and atenolol. Holding Lasix and valsartan due to renal failure and hyponatremia.  Depression  Continue current medications.  B/l Leg edema  This is chronic per patient. Unclear when she was started on Lasix but could have been 4-5 months ago.   Pain in joint, lower leg  Chronic. Patient states she has had limited mobility. Denies any falls or injuries. Pt/OT has seen and HH recommended.   Code Status: Full Code  DVT Prophylaxis: Heparin    Family Communication: Discussed with  patient  Disposition Plan: Not ready for discharge. Will need Home health PT/OT on discharge. Anticipate discharge in 2-3 days.    LOS: 3 days   [facility] Pager [phone] 06/06/2013, 12:06 PM  If 8PM-8AM, please contact night-coverage at [email or web address], password [facility]

## 2013-06-06 NOTE — Progress Notes (Signed)
Occupational Therapy Treatment Patient Details Name: Tara [name] MRN: [id number] DOB: 1951/02/19 Today's Date: 06/06/2013 Time: 1235-1300 OT Time Calculation (min): 25 min  OT Assessment / Plan / Recommendation  History of present illness 62 yo female admitted with acute on chronic kidney failure. Hx of sarcoidosis, DM, chronic knee pain/instability (L knee), bil LE edema   OT comments  Do not feel this patient is safe to return home alone. Pt needed constant verbal cues during bathing task.  Unsure pts baseline cognitive status.   Pt did not demonstrate ability to problem solve during this OT session   Follow Up Recommendations  SNF                Progress towards OT Goals Progress towards OT goals: Progressing toward goals  Plan Discharge plan needs to be updated    Precautions / Restrictions Precautions Precautions: Fall Restrictions Weight Bearing Restrictions: No       ADL  Grooming: Set up Where Assessed - Grooming: Unsupported sitting Upper Body Bathing: Set up Where Assessed - Upper Body Bathing: Unsupported sitting Lower Body Bathing: Minimal assistance Where Assessed - Lower Body Bathing: Unsupported sit to stand Upper Body Dressing: Set up Where Assessed - Upper Body Dressing: Unsupported sitting Lower Body Dressing: Minimal assistance Where Assessed - Lower Body Dressing: Unsupported sit to stand Toilet Transfer: Minimal assistance Toilet Transfer Method: Sit to stand Toilet Transfer Equipment: Bedside commode Toileting - Clothing Manipulation and Hygiene: Minimal assistance Where Assessed - Camera operator Manipulation and Hygiene: Standing ADL Comments: Pt needed constant verbal cues to perform bath- asking OT what she should do next.  Do not feel pt is safe to return home alone.          Visit Information  Last OT Received On: 06/06/13 Assistance Needed: +1 History of Present Illness: 62 yo female admitted with acute on chronic kidney failure.  Hx of sarcoidosis, DM, chronic knee pain/instability (L knee), bil LE edema          Cognition  Cognition Arousal/Alertness: Awake/alert Behavior During Therapy: Flat affect Overall Cognitive Status: No family/caregiver present to determine baseline cognitive functioning    Mobility  Bed Mobility Bed Mobility: Supine to Sit Supine to Sit: 5: Supervision;HOB flat Transfers Transfers: Sit to Stand;Stand to Sit Sit to Stand: 4: Min guard;From chair/3-in-1;From toilet Stand to Sit: 4: Min guard;To toilet;To chair/3-in-1 Details for Transfer Assistance: cues for hand placement and min guard for safety.          End of Session OT - End of Session Equipment Utilized During Treatment: Rolling walker Activity Tolerance: Patient tolerated treatment well Patient left: in chair;with call bell/phone within reach  [place], Thereasa Parkin 06/06/2013, 1:06 PM

## 2013-06-06 NOTE — ED Provider Notes (Signed)
Medical screening examination/treatment/procedure(s) were conducted as a shared visit with non-physician practitioner(s) and myself.  I personally evaluated the patient during the encounter.  Patient recently treated for bronchitis, had followup to her primary care doctor's office for repeat blood work. Was sent to the emergency department for repeat evaluation for abnormal labs. Patient is found to be hyponatremic. She has had progressive decline in her sodium, now down to 1.5. She has a leukocytosis of 32, at least partially secondary to steroid use. She does have evidence of infection, specifically UTI. No pneumonia seen on x-ray. Patient does exhibit progressively worsening acute kidney injury. Based on these findings, will require hospitalization for further management.  EKG Interpretation    Date/Time:  Wednesday June 03 2013 21:01:09 EST Ventricular Rate:  67 PR Interval:  166 QRS Duration: 95 QT Interval:  394 QTC Calculation: 416 R Axis:   29 Text Interpretation:  Sinus rhythm Normal ECG Confirmed by Betsey Holiday  MD, Deaundre Allston ([id number]) on 06/03/2013 9:06:24 PM              Orpah Greek, MD 06/06/13 [phone]

## 2013-06-07 DIAGNOSIS — I10 Essential (primary) hypertension: Secondary | ICD-10-CM

## 2013-06-07 LAB — GLUCOSE, CAPILLARY
Glucose-Capillary: 127 mg/dL — ABNORMAL HIGH (ref 70–99)
Glucose-Capillary: 139 mg/dL — ABNORMAL HIGH (ref 70–99)

## 2013-06-07 LAB — RETICULOCYTES
RBC.: 2.98 MIL/uL — ABNORMAL LOW (ref 3.87–5.11)
Retic Count, Absolute: 35.8 10*3/uL (ref 19.0–186.0)

## 2013-06-07 LAB — BASIC METABOLIC PANEL
BUN: 30 mg/dL — ABNORMAL HIGH (ref 6–23)
Calcium: 8.7 mg/dL (ref 8.4–10.5)
Creatinine, Ser: 2.34 mg/dL — ABNORMAL HIGH (ref 0.50–1.10)
GFR calc Af Amer: 25 mL/min — ABNORMAL LOW (ref >90)
GFR calc non Af Amer: 21 mL/min — ABNORMAL LOW (ref >90)
Glucose, Bld: 127 mg/dL — ABNORMAL HIGH (ref 70–99)
Potassium: 4.1 mEq/L (ref 3.5–5.1)

## 2013-06-07 LAB — CBC
MCHC: 32.9 g/dL (ref 30.0–36.0)
Platelets: 393 10*3/uL (ref 150–400)
RDW: 17.4 % — ABNORMAL HIGH (ref 11.5–15.5)

## 2013-06-07 LAB — VITAMIN B12: Vitamin B-12: 623 pg/mL (ref 211–911)

## 2013-06-07 LAB — FOLATE: Folate: >20 ng/mL

## 2013-06-07 NOTE — Progress Notes (Signed)
[facility] PROGRESS NOTE  Kasy Doke [id number] DOB: Aug 30, 1950 DOA: 06/03/2013  PCP: Cathlean Cower, MD  Brief HPI: 62 year old obese female with history of mediastinal sarcoidosis (on, off and on prednisone), diabetes mellitus, depression, arthritis, hypertension,CKD stage 2 ( follows with Dr Posey Pronto), who was seen in the ED few days prior to this admission for URI symptoms suggestive of bronchitis and discharged on a short tapering dose of prednisone. She was found to have hyponatremia and worsening of her renal function. Patient is on chronic Lasix 20 mg twice a day for leg edema. She was seen at her PCPs office on day of admission and complained that she was feeling fatigued with body aches and chills for past 2 weeks. She also reported persistent symptom of bronchitis. She denied any chest pain, shortness of breath, orthopnea, PND, worsening leg swellings, palpitations, headache, blurred vision, dizziness, polyuria, polydipsia, change in her skin or hair. She does report urinary urgency for possible one month. Denied dysuria or bowel symptoms. Denied nausea or vomiting. She was found to have low sodium levels with elevated creatinine and was referred for admission.  Past medical history:  Past Medical History  Diagnosis Date  . ANKLE PAIN, LEFT 04/01/2008  . ANXIETY 04/17/2007  . COLONIC POLYPS, HX OF 08/01/2007  . CONTUSIONS, MULTIPLE 04/01/2009  . DEPRESSION 04/17/2007  . DIZZINESS 08/01/2007  . DYSPNEA 08/01/2007  . Enlargement of lymph nodes 08/13/2007  . GLUCOSE INTOLERANCE 08/01/2007  . HYPERLIPIDEMIA 08/01/2007  . HYPERSOMNIA 07/28/2008  . HYPERTENSION 04/17/2007  . JOINT EFFUSION, LEFT KNEE 06/02/2010  . Loose body in knee 04/01/2009  . Morbid obesity 04/20/2007  . OTHER DISEASES OF LUNG NOT ELSEWHERE CLASSIFIED 08/01/2007  . Pain in joint, lower leg 04/01/2009  . PERIPHERAL EDEMA 04/21/2009  . Sarcoidosis 09/25/2007  . SHOULDER PAIN, LEFT 04/01/2009  . Impaired glucose tolerance  03/23/2011  . Oligomenorrhea   . Migraines     "stopped 3-4 yr ago" (07/23/2012)  . 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 Chronic kidney disease     Consultants: None  Procedures: None  Antibiotics: Ceftriaxone 11/19-->11/22 Azithromycin 11/19 Keflex 11/22-->  Subjective: Patient feels well. No complaints offered. Denies any pain today. Denies shortness of breath.   Objective: Vital Signs  Filed Vitals:   06/06/13 1934 06/06/13 2130 06/07/13 0500 06/07/13 0857  BP:  141/73 170/90   Pulse:  74 80   Temp:  98.3 F (36.8 C) 99 F (37.2 C)   TempSrc:  Oral Oral   Resp:  18 16   Height:      Weight:      SpO2: 98% 100% 100% 97%    Filed Weights   06/04/13 0103  Weight: 110.8 kg (244 lb 4.3 oz)    Intake/Output from previous day: 11/22 0701 - 11/23 0700 In: 3990 [P.O.:240; I.V.:3750] Out: 1400 [Urine:1400]  General appearance: alert, cooperative, appears stated age, no distress and moderately obese Resp: decreased air entry at bases but no obvious crackles or wheezes. Cardio: regular rate and rhythm, S1, S2 normal, no murmur, click, rub or gallop GI: soft, non-tender; bowel sounds normal; no masses,  no organomegaly Extremities: minimal edema noted bilateral LE. No calf tenderness. Pulses: 2+ and symmetric Neurologic: No focal deficits.  Lab Results:  Basic Metabolic Panel:  Recent Labs Lab 06/03/13 2113 06/04/13 0421 06/05/13 0450 06/06/13 0513 06/07/13 0511  NA 125* 127* 128* 132* 134*  K 4.2 4.0 3.6 3.9 4.1  CL 91* 92* 98 102 104  CO2 21  22 20 20 20   GLUCOSE 268* 182* 160* 141* 127*  BUN 40* 43* 47* 41* 30*  CREATININE 2.08* 2.22* 2.97* 2.79* 2.34*  CALCIUM 10.2 9.6 9.2 8.7 8.7  MG 1.8  --   --   --   --    Liver Function Tests:  Recent Labs Lab 06/03/13 1505 06/03/13 2113  AST 16 12  ALT 25 21  ALKPHOS 86 98  BILITOT 0.5 0.2*  PROT 6.8 6.9  ALBUMIN 2.8* 2.6*   CBC:  Recent Labs Lab 06/03/13 1505 06/03/13 2113 06/04/13 0630  06/05/13 0450 06/06/13 0513 06/07/13 0511  WBC 34.0 Repeated and verified X2.* 32.0* 24.8* 20.1* 17.4* 18.6*  NEUTROABS 31.2*  --   --   --   --   --   HGB 9.5* 9.2* 9.4* 8.5* 7.9* 7.6*  HCT 28.5* 27.6* 27.8* 25.0* 23.6* 23.1*  MCV 78.9 77.7* 78.3 77.2* 76.6* 77.5*  PLT 469.0* 460* 442* 417* 403* 393   Cardiac Enzymes:  Recent Labs Lab 06/03/13 2113  TROPONINI <0.30   CBG:  Recent Labs Lab 06/06/13 0722 06/06/13 1237 06/06/13 1732 06/06/13 2215 06/07/13 0709  GLUCAP 125* 271* 140* 173* 127*    Recent Results (from the past 240 hour(s))  URINE CULTURE     Status: None   Collection Time    06/03/13  9:01 PM      Result Value Range Status   Specimen Description URINE, CLEAN CATCH   Final   Special Requests NONE   Final   Culture  Setup Time     Final   Value: 06/04/2013 01:10     Performed at [place]     Final   Value: >=100,000 COLONIES/ML     Performed at [facility]   Culture     Final   Value: ESCHERICHIA COLI     Performed at [facility]   Report Status 06/06/2013 FINAL   Final   Organism ID, Bacteria ESCHERICHIA COLI   Final      Studies/Results: No results found.  Medications:  Scheduled: . albuterol  2.5 mg Nebulization TID  . atenolol  50 mg Oral Daily  . azithromycin  500 mg Oral Daily  . cephALEXin  250 mg Oral Q12H  . clorazepate  7.5 mg Oral Q6H  . diltiazem  240 mg Oral Daily  . docusate sodium  100 mg Oral BID  . ferrous sulfate  325 mg Oral Q breakfast  . heparin  5,000 Units Subcutaneous Q8H  . insulin aspart  0-15 Units Subcutaneous TID WC  . multivitamin with minerals  1 tablet Oral Daily  . polyethylene glycol  17 g Oral Daily  . sodium chloride  3 mL Intravenous Q12H  . venlafaxine XR  75 mg Oral Q breakfast   Continuous: . sodium chloride 100 mL (06/07/13 0946)   [id number], acetaminophen, albuterol, guaiFENesin-dextromethorphan, zolpidem  Assessment/Plan:  Principal  Problem:   Acute on chronic kidney failure Active Problems:   Sarcoidosis   HYPERLIPIDEMIA   Morbid obesity   DEPRESSION   HYPERTENSION   Pain in joint, lower leg   Other malaise and fatigue   UTI (urinary tract infection)   Leucocytosis   Microcytic anemia    Hyponatremia  Improving. Likely due to recent diuretics and dehydration from recent bronchitis. Elevation in BUN and creatinine supports this as well. Continue IVF but decrease rate. Holding diuretics. Repeat labs daily.   Acute on Chronic Kidney Disease  Stage 2 Creatinine continues to improve. Decrease IVF. Good UO. [place] report reviewed. Can hold off on getting Nephrology input. She should follow up with her Nephrologist.  UTI (urinary tract infection)  Culture show E coli. Change to Keflex adjusted for renal function.   Microcytic Anemia Anemia panel pending. No overt bleeding identified. Drop in Hgb likely dilutional.  URI symptom with generalized malaise  Improved. Continue azithromycin. Prn nebs and albuterol inhaler. Continue Antitussives. Influenza panel negative.  Leucocytosis  Could be due to Prednisone that she was on the past few days. Also has UTI. No clinical sign of sepsis. Improving.  Diabetes mellitus  Holding metformin. Continue SSI. A1C of 6.7   History of Sarcoidosis  Not on any medication at present. Follow as an outpatient   Hypertension  BP noted to be high this AM. Will monitor for now and adjust meds if remains elevated. Continue Cardizem and atenolol. Holding Lasix and valsartan due to renal failure and hyponatremia.  Depression  Continue current medications.  B/l Leg edema  This is chronic per patient. Unclear when she was started on Lasix but could have been 4-5 months ago.   Pain in joint, lower leg  Chronic. Patient states she has had limited mobility. Denies any falls or injuries. Pt/OT has seen. SNF suggested by OT. Patient would rather go home. Will discuss again in AM.   Check  RA sats.  Code Status: Full Code  DVT Prophylaxis: Heparin    Family Communication: Discussed with patient  Disposition Plan: Anticipate discharge in AM. Patient reluctant to consider SNF. Will discuss again in AM. Will likely go with [facility].    LOS: 4 days   [facility] Pager [phone] 06/07/2013, 9:50 AM  If 8PM-8AM, please contact night-coverage at [email or web address], password [facility]

## 2013-06-08 DIAGNOSIS — N184 Chronic kidney disease, stage 4 (severe): Secondary | ICD-10-CM

## 2013-06-08 LAB — BASIC METABOLIC PANEL
Calcium: 8.9 mg/dL (ref 8.4–10.5)
Chloride: 103 mEq/L (ref 96–112)
GFR calc non Af Amer: 24 mL/min — ABNORMAL LOW (ref >90)
Glucose, Bld: 128 mg/dL — ABNORMAL HIGH (ref 70–99)
Potassium: 4.1 mEq/L (ref 3.5–5.1)
Sodium: 134 mEq/L — ABNORMAL LOW (ref 135–145)

## 2013-06-08 LAB — CBC
Hemoglobin: 7.7 g/dL — ABNORMAL LOW (ref 12.0–15.0)
MCH: 25.3 pg — ABNORMAL LOW (ref 26.0–34.0)
MCHC: 32.8 g/dL (ref 30.0–36.0)
Platelets: 368 10*3/uL (ref 150–400)

## 2013-06-08 LAB — GLUCOSE, CAPILLARY
Glucose-Capillary: 113 mg/dL — ABNORMAL HIGH (ref 70–99)
Glucose-Capillary: 147 mg/dL — ABNORMAL HIGH (ref 70–99)

## 2013-06-08 MED ORDER — CEPHALEXIN 250 MG PO CAPS: 250.0000 mg | ORAL_CAPSULE | Freq: Two times a day (BID) | ORAL | Status: DC

## 2013-06-08 NOTE — Discharge Summary (Addendum)
[facility]  Physician Discharge Summary   Patient ID: Tara [name] MRN: [id number] DOB/AGE: Nov 19, 1950 62 y.o.  Admit date: 06/03/2013 Discharge date: 06/08/2013  PCP: Cathlean Cower, MD  DISCHARGE DIAGNOSES:  Active Problems:   Sarcoidosis   HYPERLIPIDEMIA   Morbid obesity   DEPRESSION   HYPERTENSION   Pain in joint, lower leg   Other malaise and fatigue   UTI (urinary tract infection)   Leucocytosis   Microcytic anemia   CKD (chronic kidney disease) stage 4, GFR 15-29 ml/min   RECOMMENDATIONS FOR OUTPATIENT FOLLOW UP: 1. Patient to have close follow up with PCP for labs 2. Holding Lasix and metformin 3. Home Health arranged  DISCHARGE CONDITION: fair  Diet recommendation: Modified Carb  Filed Weights   06/04/13 0103  Weight: 110.8 kg (244 lb 4.3 oz)    INITIAL HISTORY: 62 year old obese female with history of mediastinal sarcoidosis (on, off and on prednisone), diabetes mellitus, depression, arthritis, hypertension,CKD stage 2 ( follows with Dr Posey Pronto), who was seen in the ED few days prior to this admission for URI symptoms suggestive of bronchitis and discharged on a short tapering dose of prednisone. She was found to have hyponatremia and worsening of her renal function. Patient is on chronic Lasix 20 mg twice a day for leg edema. She was seen at her PCPs office on day of admission and complained that she was feeling fatigued with body aches and chills for past 2 weeks. She also reported persistent symptom of bronchitis. She denied any chest pain, shortness of breath, orthopnea, PND, worsening leg swellings, palpitations, headache, blurred vision, dizziness, polyuria, polydipsia, change in her skin or hair. Denied dysuria or bowel symptoms. Denied nausea or vomiting. She was found to have low sodium levels with elevated creatinine and was referred for admission.  Consultations:  None  Procedures:  None  HOSPITAL COURSE:   Hyponatremia  Improved with  IVF. It was low likely due to recent diuretics and dehydration from recent bronchitis. Elevation in BUN and creatinine supported this as well.   Acute on Chronic Kidney Disease Stage 2  Creatinine continues to improve and is close to baseline. She has good UO. [place] report reviewed. She will need to follow up with her Nephrologist and with her PCP with repeat labs. Can resume ARB with close follow up. Holding Lasix.  UTI (urinary tract infection)  Culture show E coli. Continue Keflex for 4 more days.   Microcytic Anemia/Anemia of Chronic Disease Anemia panel reviewed. No iron deficiency noted. Hgb is stable. No overt blood loss. Drop in Hgb likely dilutional. Can be followed as OP.  URI symptom with generalized malaise  Improved. Completed course of Azithromycin. Influenza panel negative.   Leucocytosis  Could be due to Prednisone that she was on the past few days prior to admission or from UTI. No clinical sign of sepsis. Improving. She is afebrile.  Diabetes mellitus  HBA1C of 6.7. She was on Metformin at homer which is being held due to renal failure. PCP to further address this.  History of Sarcoidosis  Not on any medication at present. Follow as an outpatient   Hypertension  Resume home medications.   Depression  Continue current medications.   B/l Leg edema  This is chronic per patient. Unclear when she was started on Lasix but could have been 4-5 months ago. Holding Lasix for now till sees her PCP in follow up.  Pain in joint, lower leg  This is chronic. Patient states she has had limited  mobility. Denies any falls or injuries. PT/OT has seen. Home health will be arranged.  Patient doing well today. She is ebing discharged. Her O2 sats were 95% on RA with ambulation. Patient asked to have someone drive her home today. Given note for work.   PERTINENT LABS:  The results of significant diagnostics from this hospitalization (including imaging, microbiology, ancillary and  laboratory) are listed below for reference.    Microbiology: Recent Results (from the past 240 hour(s))  URINE CULTURE     Status: None   Collection Time    06/03/13  9:01 PM      Result Value Range Status   Specimen Description URINE, CLEAN CATCH   Final   Special Requests NONE   Final   Culture  Setup Time     Final   Value: 06/04/2013 01:10     Performed at [place]     Final   Value: >=100,000 COLONIES/ML     Performed at [facility]   Culture     Final   Value: ESCHERICHIA COLI     Performed at [facility]   Report Status 06/06/2013 FINAL   Final   Organism ID, Bacteria ESCHERICHIA COLI   Final     Labs: Basic Metabolic Panel:  Recent Labs Lab 06/03/13 2113 06/04/13 0421 06/05/13 0450 06/06/13 0513 06/07/13 0511 06/08/13 0505  NA 125* 127* 128* 132* 134* 134*  K 4.2 4.0 3.6 3.9 4.1 4.1  CL 91* 92* 98 102 104 103  CO2 21 22 20 20 20 21   GLUCOSE 268* 182* 160* 141* 127* 128*  BUN 40* 43* 47* 41* 30* 26*  CREATININE 2.08* 2.22* 2.97* 2.79* 2.34* 2.13*  CALCIUM 10.2 9.6 9.2 8.7 8.7 8.9  MG 1.8  --   --   --   --   --    Liver Function Tests:  Recent Labs Lab 06/03/13 1505 06/03/13 2113  AST 16 12  ALT 25 21  ALKPHOS 86 98  BILITOT 0.5 0.2*  PROT 6.8 6.9  ALBUMIN 2.8* 2.6*   CBC:  Recent Labs Lab 06/03/13 1505  06/04/13 0630 06/05/13 0450 06/06/13 0513 06/07/13 0511 06/08/13 0505  WBC 34.0 Repeated and verified X2.*  < > 24.8* 20.1* 17.4* 18.6* 17.2*  NEUTROABS 31.2*  --   --   --   --   --   --   HGB 9.5*  < > 9.4* 8.5* 7.9* 7.6* 7.7*  HCT 28.5*  < > 27.8* 25.0* 23.6* 23.1* 23.5*  MCV 78.9  < > 78.3 77.2* 76.6* 77.5* 77.3*  PLT 469.0*  < > 442* 417* 403* 393 368  < > = values in this interval not displayed. Cardiac Enzymes:  Recent Labs Lab 06/03/13 2113  TROPONINI <0.30   CBG:  Recent Labs Lab 06/07/13 0709 06/07/13 1138 06/07/13 1712 06/07/13 2221 06/08/13 0712  GLUCAP 127* 127*  136* 139* 113*     IMAGING STUDIES Dg Chest 2 View  05/30/2013   CLINICAL DATA:  Nonproductive cough and nasal congestion. History of sarcoidosis.  EXAM: CHEST  2 VIEW  COMPARISON:  Chest radiograph from 02/06/2013  FINDINGS: The lungs are well-aerated. Vascular congestion is noted. Chronically increased interstitial markings are seen. There is no evidence of pleural effusion or pneumothorax.  The heart is normal in size; the mediastinal contour is within normal limits. No acute osseous abnormalities are seen.  IMPRESSION: Mild chronic lung changes noted, with underlying vascular congestion.  No definite superimposed focal airspace consolidation seen.   Electronically Signed   By: Garald Balding M.D.   On: 05/30/2013 23:10   US Renal  06/04/2013   CLINICAL DATA:  Acute kidney injury  EXAM: RENAL/URINARY TRACT ULTRASOUND COMPLETE  COMPARISON:  None.  FINDINGS: Right Kidney  Length: 13 cm. Diffuse increased echogenicity. No mass or hydronephrosis seen.  Left Kidney  Length: 13 cm, relatively similar to CT 07/23/2012. Diffuse increased echogenicity. No mass or hydronephrosis seen.  Bladder  Appears normal for degree of bladder distention. Bladder volume 700 cc.  IMPRESSION: 1. Nephromegaly and medical renal disease. 2. No hydronephrosis. 3. Bladder volume 700 cc.   Electronically Signed   By: Jorje Guild M.D.   On: 06/04/2013 01:46    DISCHARGE EXAMINATION: Filed Vitals:   06/07/13 2130 06/08/13 0510 06/08/13 0830 06/08/13 0959  BP: 146/81 149/75    Pulse: 73 76    Temp: 98.1 F (36.7 C) 98.7 F (37.1 C)    TempSrc: Oral Oral    Resp: 18 18    Height:      Weight:      SpO2: 100% 99% 99% 95%   General appearance: alert, cooperative, appears stated age and moderately obese Head: Normocephalic, without obvious abnormality, atraumatic Resp: clear to auscultation bilaterally Cardio: regular rate and rhythm, S1, S2 normal, no murmur, click, rub or gallop Neurologic: Grossly  normal  DISPOSITION: Home with Home health  Discharge Orders   Future Appointments Provider Department Dept Phone   07/15/2013 10:45 AM Deneise Lever, MD [facility] [phone]   09/01/2013 1:00 PM Biagio Borg, MD [place] [phone]   Future Orders Complete By Expires   Diet Carb Modified  As directed    Discharge instructions  As directed    Comments:     Please follow up with your PCP by end of week for blood work and examination. We are holding your metformin due to kidney failure. Please discuss this with your PCP. Please follow up with your Nephrologist in 1-2 weeks.   Increase activity slowly  As directed       ALLERGIES:  Allergies  Allergen Reactions  . Floxin [Ocuflox]     shaky  . Fluoxetine Hcl     Suicidal thoughts  . Sulfonamide Derivatives Rash    Current Discharge Medication List    START taking these medications   Details  cephALEXin (KEFLEX) 250 MG capsule Take 1 capsule (250 mg total) by mouth every 12 (twelve) hours. For 4 more days Qty: 8 capsule, Refills: 0      CONTINUE these medications which have NOT CHANGED   Details  albuterol (PROVENTIL HFA;VENTOLIN HFA) 108 (90 BASE) MCG/ACT inhaler Inhale 1-2 puffs into the lungs every 6 (six) hours as needed for wheezing or shortness of breath. Qty: 1 Inhaler, Refills: 0    amphetamine-dextroamphetamine (ADDERALL) 30 MG tablet Take 30 mg by mouth 2 (two) times daily.    aspirin 81 MG tablet Take 81 mg by mouth daily.      atenolol (TENORMIN) 50 MG tablet TAKE 1 TABLET BY MOUTH EVERY NIGHT AT BEDTIME Qty: 90 tablet, Refills: 3    clorazepate (TRANXENE) 7.5 MG tablet Take 7.5 mg by mouth QID.     desvenlafaxine (PRISTIQ) 50 MG 24 hr tablet Take 50 mg by mouth daily.    diltiazem (CARTIA XT) 240 MG 24 hr capsule Take 1 capsule (240 mg total) by mouth daily. Qty: 90 capsule,  Refills: 3    ferrous sulfate 325 (65 FE) MG tablet Take 325 mg by mouth daily with  breakfast.    glucosamine-chondroitin 500-400 MG tablet Take 1 tablet by mouth 3 (three) times daily.    Lancets MISC 1 application by Does not apply route 2 (two) times daily. Qty: 200 each, Refills: 5    Multiple Vitamin (MULTIVITAMIN) tablet Take 1 tablet by mouth daily.    valsartan (DIOVAN) 320 MG tablet Take 1 tablet (320 mg total) by mouth daily. Qty: 90 tablet, Refills: 3    zaleplon (SONATA) 10 MG capsule Take 10 mg by mouth at bedtime.      STOP taking these medications     chlorpheniramine-HYDROcodone (TUSSIONEX PENNKINETIC ER) 10-8 MG/5ML LQCR      furosemide (LASIX) 20 MG tablet      metFORMIN (GLUCOPHAGE XR) 500 MG 24 hr tablet      predniSONE (STERAPRED UNI-PAK) 10 MG tablet        Follow-up Information   Follow up with Cathlean Cower, MD. Schedule an appointment as soon as possible for a visit in 3 days.   Specialties:  Internal Medicine, Radiology   Contact information:   [address] [place] [address] [phone]       Follow up with Ulla Potash., MD. Schedule an appointment as soon as possible for a visit in 2 weeks.   Specialty:  Nephrology   Contact information:   [place] [address] [phone]       TOTAL DISCHARGE TIME: 38 mins  [facility] Pager [phone]  06/08/2013, 11:22 AM

## 2013-06-08 NOTE — Progress Notes (Signed)
Loss of IV access.  Notified Dr. Maryland Pink.  Per Dr. Maryland Pink, IV can be discontinued.  Read back and confirmed.  Updated on patient's O2 levels remaining stable at 98% when ambulating with room air.

## 2013-06-08 NOTE — Progress Notes (Signed)
Physical Therapy Treatment Patient Details Name: Tara [name] MRN: [id number] DOB: [birth year] Today's Date: 06/08/2013 Time: [id number] PT Time Calculation (min): 10 min  PT Assessment / Plan / Recommendation  History of Present Illness 62 yo female admitted with acute on chronic kidney failure. Hx of sarcoidosis, DM, chronic knee pain/instability (L knee), bil LE edema   PT Comments   Progressing with mobility. More alert this session. Plan is for d/c home. Pt's walker (from home) was missing. Therapist located it and replaced L and R rear walker leg "stoppers/glides".   Follow Up Recommendations  Home health PT     Does the patient have the potential to tolerate intense rehabilitation     Barriers to Discharge        Equipment Recommendations  None recommended by PT    Recommendations for Other Services OT consult  Frequency Min 3X/week   Progress towards PT Goals Progress towards PT goals: Progressing toward goals  Plan Current plan remains appropriate    Precautions / Restrictions Precautions Precautions: Fall Restrictions Weight Bearing Restrictions: No   Pertinent Vitals/Pain No c/o pain    Mobility  Bed Mobility Bed Mobility: Not assessed Transfers Transfers: Sit to Stand;Stand to Sit Sit to Stand: 6: Modified independent (Device/Increase time);From chair/3-in-1 Stand to Sit: 6: Modified independent (Device/Increase time);To chair/3-in-1 Ambulation/Gait Ambulation/Gait Assistance: 5: Supervision Ambulation Distance (Feet): 300 Feet Assistive device: Rolling walker Ambulation/Gait Assistance Details: VCs safety, distance from walker. NO LOB Gait Pattern: Step-through pattern;Decreased stride length;Trunk flexed    Exercises     PT Diagnosis:    PT Problem List:   PT Treatment Interventions:     PT Goals (current goals can now be found in the care plan section) Acute Rehab PT Goals Patient Stated Goal: go home and care for cats  Visit Information  Last PT Received On: 06/08/13 Assistance Needed: +1 History of Present Illness: 63 yo female admitted with acute on chronic kidney failure. Hx of sarcoidosis, DM, chronic knee pain/instability (L knee), bil LE edema    Subjective Data  Patient Stated Goal: go home and care for cats   Cognition  Cognition Arousal/Alertness: Awake/alert Behavior During Therapy: WFL for tasks assessed/performed Overall Cognitive Status: Within Functional Limits for tasks assessed    Balance     End of Session PT - End of Session Activity Tolerance: Patient tolerated treatment well Patient left: in chair;with call bell/phone within reach   GP     Weston Anna, MPT Pager: [phone]

## 2013-06-08 NOTE — Progress Notes (Signed)
[facility]  [facility] is providing the following services: Commode  If patient discharges after hours, please call [phone].   Linward Headland 06/08/2013, 12:43 PM

## 2013-06-08 NOTE — Progress Notes (Signed)
Occupational Therapy Treatment Patient Details Name: Tara [name] MRN: [id number] DOB: 08/03/1950 Today's Date: 06/08/2013 Time: [id number] OT Time Calculation (min): 29 min  OT Assessment / Plan / Recommendation  History of present illness 62 yo female admitted with acute on chronic kidney failure. Hx of sarcoidosis, DM, chronic knee pain/instability (L knee), bil LE edema   OT comments  Pt doing much better this OT session  Follow Up Recommendations  Home health OT       Equipment Recommendations  3 in 1 bedside comode       Frequency Min 2X/week   Progress towards OT Goals Progress towards OT goals: Progressing toward goals  Plan Discharge plan needs to be updated    Precautions / Restrictions Precautions Precautions: Fall Restrictions Weight Bearing Restrictions: No       ADL  Grooming: Set up Where Assessed - Grooming: Unsupported sitting Upper Body Bathing: Set up Where Assessed - Upper Body Bathing: Unsupported sitting Lower Body Bathing: Minimal assistance;Set up Where Assessed - Lower Body Bathing: Unsupported sit to stand Upper Body Dressing: Set up Where Assessed - Upper Body Dressing: Unsupported sitting Lower Body Dressing: Set up Where Assessed - Lower Body Dressing: Unsupported sit to stand Toilet Transfer: Set up Toilet Transfer Method: Sit to stand Toilet Transfer Equipment: Regular height toilet Toileting - Clothing Manipulation and Hygiene: Minimal assistance;Set up Where Assessed - Toileting Clothing Manipulation and Hygiene: Standing Equipment Used: Rolling walker ADL Comments: Pt doing better this day .  Feel will benefit from [facility] therapy as well as a bath aide      OT Goals(current goals can now be found in the care plan section) Acute Rehab OT Goals Patient Stated Goal: go home and care for cats  Visit Information  Last OT Received On: 06/08/13 Assistance Needed: +1 History of Present Illness: 62 yo female admitted with acute on  chronic kidney failure. Hx of sarcoidosis, DM, chronic knee pain/instability (L knee), bil LE edema          Cognition  Cognition Arousal/Alertness: Awake/alert Behavior During Therapy: WFL for tasks assessed/performed Overall Cognitive Status: Within Functional Limits for tasks assessed    Mobility  Transfers Transfers: Sit to Stand;Stand to Sit Sit to Stand: From chair/3-in-1;From toilet;5: Supervision Stand to Sit: To toilet;To chair/3-in-1;5: Supervision          End of Session OT - End of Session Equipment Utilized During Treatment: Rolling walker Activity Tolerance: Patient tolerated treatment well Patient left: in chair with call bell with in reach  [place], Thereasa Parkin 06/08/2013, 10:04 AM

## 2013-06-09 ENCOUNTER — Telehealth: Payer: Self-pay

## 2013-06-09 NOTE — Telephone Encounter (Signed)
Ok for verbal 

## 2013-06-09 NOTE — Telephone Encounter (Signed)
Patty at [facility] ext. [phone].  Requesting nursing and PT started on this patient. Verbal is ok.

## 2013-06-09 NOTE — Telephone Encounter (Signed)
HHRN informed of verbal ok per PCP

## 2013-06-10 ENCOUNTER — Encounter: Payer: Self-pay | Admitting: Internal Medicine

## 2013-06-10 ENCOUNTER — Ambulatory Visit (INDEPENDENT_AMBULATORY_CARE_PROVIDER_SITE_OTHER): Payer: Managed Care, Other (non HMO) | Admitting: Internal Medicine

## 2013-06-10 VITALS — BP 118/80 | HR 78 | Temp 97.3°F | Wt 244.5 lb

## 2013-06-10 DIAGNOSIS — E099 Drug or chemical induced diabetes mellitus without complications: Secondary | ICD-10-CM

## 2013-06-10 DIAGNOSIS — D72829 Elevated white blood cell count, unspecified: Secondary | ICD-10-CM

## 2013-06-10 DIAGNOSIS — N184 Chronic kidney disease, stage 4 (severe): Secondary | ICD-10-CM

## 2013-06-10 DIAGNOSIS — T380X5A Adverse effect of glucocorticoids and synthetic analogues, initial encounter: Secondary | ICD-10-CM

## 2013-06-10 DIAGNOSIS — E139 Other specified diabetes mellitus without complications: Secondary | ICD-10-CM

## 2013-06-10 DIAGNOSIS — E871 Hypo-osmolality and hyponatremia: Secondary | ICD-10-CM

## 2013-06-10 NOTE — Progress Notes (Signed)
Pre-visit discussion using our clinic review tool. No additional management support is needed unless otherwise documented below in the visit note.  

## 2013-06-10 NOTE — Assessment & Plan Note (Signed)
To cont to hold metformin for now, check f/u when lab open on nov 28

## 2013-06-10 NOTE — Assessment & Plan Note (Signed)
For f/u lab in 2 days, then f/u with renal as planned,  to f/u any worsening symptoms or concerns

## 2013-06-10 NOTE — Assessment & Plan Note (Signed)
For f/u lab, symptomatically improved,  to f/u any worsening symptoms or concerns

## 2013-06-10 NOTE — Patient Instructions (Signed)
Please continue all other medications as before, and refills have been done if requested. Please have the pharmacy call with any other refills you may need.  Please return on Friday for LAB only - cbc and bmet to re- check the sodium, kidney function and white blood cells  Please keep your appointments with your specialists as you have planned  - Dr Posey Pronto on Dec 30  We will likely need to re-start the metformin, but I think we should let Dr Posey Pronto handle th lasix to re-start if needed

## 2013-06-10 NOTE — Progress Notes (Signed)
Subjective:    Patient ID: Tara [name], female    DOB: Jan 21, 1951, 62 y.o.   MRN: [id number]  HPI  Here to f/u, overall much improved after inpt tx for UTI, leukocytosis, dehydration, AKI, worsening hyponatremia in the setting of CKD, sarcoid, DM and finding of anemia of chronic inflammation.  Pt denies chest pain, increased sob or doe, wheezing, orthopnea, PND, increased LE swelling, palpitations, dizziness or syncope. Denies urinary symptoms such as dysuria, frequency, urgency, flank pain, hematuria or n/v, fever, chills.  No acute complaints.  Still holding the lasix and metformiin as instructed .  Has f/u planned with Dr Patel/renal on Dec 30.  Has some LE edema but no worse than baseling Past Medical History  Diagnosis Date  . ANKLE PAIN, LEFT 04/01/2008  . ANXIETY 04/17/2007  . COLONIC POLYPS, HX OF 08/01/2007  . CONTUSIONS, MULTIPLE 04/01/2009  . DEPRESSION 04/17/2007  . DIZZINESS 08/01/2007  . DYSPNEA 08/01/2007  . Enlargement of lymph nodes 08/13/2007  . GLUCOSE INTOLERANCE 08/01/2007  . HYPERLIPIDEMIA 08/01/2007  . HYPERSOMNIA 07/28/2008  . HYPERTENSION 04/17/2007  . JOINT EFFUSION, LEFT KNEE 06/02/2010  . Loose body in knee 04/01/2009  . Morbid obesity 04/20/2007  . OTHER DISEASES OF LUNG NOT ELSEWHERE CLASSIFIED 08/01/2007  . Pain in joint, lower leg 04/01/2009  . PERIPHERAL EDEMA 04/21/2009  . Sarcoidosis 09/25/2007  . SHOULDER PAIN, LEFT 04/01/2009  . Impaired glucose tolerance 03/23/2011  . Oligomenorrhea   . Migraines     "stopped 3-4 yr ago" (07/23/2012)  . 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 Chronic kidney disease    Past Surgical History  Procedure Laterality Date  . Myomectomy  1994  . Fracture surgery  ?02/1997    "left upper arm; put rod in" (07/23/2012)  . Refractive surgery  08/1998    "both eyes" (07/23/2012)  . Combined mediastinoscopy and bronchoscopy  08/2007  . Gum surgery  2000-?2009    "several ORs; soft tissue graft; took material from roof of mouth" (07/23/2012    . Knee arthroscopy  03/2004; 06/2009    "right; left" Dr. Theda Sers  . Lymph node biopsy  ~ 2009    "for sarcoidosis; don't know exactly which nodes" (07/23/2102)  . Combined mediastinoscopy and bronchoscopy  2009    reports that she has never smoked. She has never used smokeless tobacco. She reports that she drinks alcohol. She reports that she does not use illicit drugs. family history includes Asthma in her sister; Diabetes in her mother; Heart disease (age of onset: 12) in her father; Hypertension in her father, mother, and sister; Stroke in her mother. There is no history of Colon cancer. Allergies  Allergen Reactions  . Floxin [Ocuflox]     shaky  . Fluoxetine Hcl     Suicidal thoughts  . Sulfonamide Derivatives Rash   Current Outpatient Prescriptions on File Prior to Visit  Medication Sig Dispense Refill  . albuterol (PROVENTIL HFA;VENTOLIN HFA) 108 (90 BASE) MCG/ACT inhaler Inhale 1-2 puffs into the lungs every 6 (six) hours as needed for wheezing or shortness of breath.  1 Inhaler  0  . amphetamine-dextroamphetamine (ADDERALL) 30 MG tablet Take 30 mg by mouth 2 (two) times daily.      Marland Kitchen aspirin 81 MG tablet Take 81 mg by mouth daily.        Marland Kitchen atenolol (TENORMIN) 50 MG tablet TAKE 1 TABLET BY MOUTH EVERY NIGHT AT BEDTIME  90 tablet  3  . cephALEXin (KEFLEX) 250 MG capsule Take 1  capsule (250 mg total) by mouth every 12 (twelve) hours. For 4 more days  8 capsule  0  . clorazepate (TRANXENE) 7.5 MG tablet Take 7.5 mg by mouth QID.       Marland Kitchen desvenlafaxine (PRISTIQ) 50 MG 24 hr tablet Take 50 mg by mouth daily.      Marland Kitchen diltiazem (CARTIA XT) 240 MG 24 hr capsule Take 1 capsule (240 mg total) by mouth daily.  90 capsule  3  . ferrous sulfate 325 (65 FE) MG tablet Take 325 mg by mouth daily with breakfast.      . glucosamine-chondroitin 500-400 MG tablet Take 1 tablet by mouth 3 (three) times daily.      . Lancets MISC 1 application by Does not apply route 2 (two) times daily.  200 each  5   . Multiple Vitamin (MULTIVITAMIN) tablet Take 1 tablet by mouth daily.      . valsartan (DIOVAN) 320 MG tablet Take 1 tablet (320 mg total) by mouth daily.  90 tablet  3  . zaleplon (SONATA) 10 MG capsule Take 10 mg by mouth at bedtime.       No current facility-administered medications on file prior to visit.   Review of Systems  Constitutional: Negative for unexpected weight change, or unusual diaphoresis  HENT: Negative for tinnitus.   Eyes: Negative for photophobia and visual disturbance.  Respiratory: Negative for choking and stridor.   Gastrointestinal: Negative for vomiting and blood in stool.  Genitourinary: Negative for hematuria and decreased urine volume.  Musculoskeletal: Negative for acute joint swelling Skin: Negative for color change and wound.  Neurological: Negative for tremors and numbness other than noted  Psychiatric/Behavioral: Negative for decreased concentration or  hyperactivity.       Objective:   Physical Exam BP 118/80  Pulse 78  Temp(Src) 97.3 F (36.3 C) (Oral)  Wt 244 lb 8 oz (110.904 kg)  SpO2 95% VS noted, not ill appearing Constitutional: Pt appears well-developed and well-nourished.  HENT: Head: NCAT.  Right Ear: External ear normal.  Left Ear: External ear normal.  Eyes: Conjunctivae and EOM are normal. Pupils are equal, round, and reactive to light.  Neck: Normal range of motion. Neck supple.  Cardiovascular: Normal rate and regular rhythm.   Pulmonary/Chest: Effort normal and breath sounds normal.  Abd:  Soft, NT, non-distended, + BS Neurological: Pt is alert. Not confused  Skin: Skin is warm. No erythema. trace to 1+ Le edema bilat Psychiatric: Pt behavior is normal. Thought content normal.     Assessment & Plan:

## 2013-06-10 NOTE — Telephone Encounter (Signed)
[facility] went out to do their admissions visit this am.  The patient had just returned from [facility], is going back to work and getting out of the house going to the movies.  HHRN cannot see the patient as patient is obviously not home bound at this time.

## 2013-06-10 NOTE — Telephone Encounter (Signed)
Noted, thanks!

## 2013-06-12 ENCOUNTER — Other Ambulatory Visit (INDEPENDENT_AMBULATORY_CARE_PROVIDER_SITE_OTHER): Payer: Managed Care, Other (non HMO)

## 2013-06-12 DIAGNOSIS — D72829 Elevated white blood cell count, unspecified: Secondary | ICD-10-CM

## 2013-06-12 DIAGNOSIS — E871 Hypo-osmolality and hyponatremia: Secondary | ICD-10-CM

## 2013-06-12 LAB — CBC WITH DIFFERENTIAL/PLATELET
Basophils Absolute: 0 10*3/uL (ref 0.0–0.1)
Basophils Relative: 0.3 % (ref 0.0–3.0)
Eosinophils Absolute: 0 10*3/uL (ref 0.0–0.7)
Eosinophils Relative: 0 % (ref 0.0–5.0)
HCT: 26 % — ABNORMAL LOW (ref 36.0–46.0)
Hemoglobin: 8.7 g/dL — ABNORMAL LOW (ref 12.0–15.0)
Lymphs Abs: 1 10*3/uL (ref 0.7–4.0)
MCHC: 33.6 g/dL (ref 30.0–36.0)
MCV: 76.9 fl — ABNORMAL LOW (ref 78.0–100.0)
Monocytes Absolute: 0.9 10*3/uL (ref 0.1–1.0)
Neutro Abs: 8.8 10*3/uL — ABNORMAL HIGH (ref 1.4–7.7)
Platelets: 466 10*3/uL — ABNORMAL HIGH (ref 150.0–400.0)
RBC: 3.37 Mil/uL — ABNORMAL LOW (ref 3.87–5.11)
RDW: 18.1 % — ABNORMAL HIGH (ref 11.5–14.6)

## 2013-06-12 LAB — BASIC METABOLIC PANEL WITH GFR
BUN: 17 mg/dL (ref 6–23)
CO2: 26 meq/L (ref 19–32)
Calcium: 9.5 mg/dL (ref 8.4–10.5)
Chloride: 101 meq/L (ref 96–112)
Creatinine, Ser: 1.8 mg/dL — ABNORMAL HIGH (ref 0.4–1.2)
GFR: 30.26 mL/min — ABNORMAL LOW
Glucose, Bld: 154 mg/dL — ABNORMAL HIGH (ref 70–99)
Potassium: 3.9 meq/L (ref 3.5–5.1)
Sodium: 135 meq/L (ref 135–145)

## 2013-07-08 ENCOUNTER — Other Ambulatory Visit: Payer: Self-pay | Admitting: Internal Medicine

## 2013-07-15 ENCOUNTER — Encounter: Payer: Self-pay | Admitting: Internal Medicine

## 2013-07-15 ENCOUNTER — Ambulatory Visit (INDEPENDENT_AMBULATORY_CARE_PROVIDER_SITE_OTHER): Payer: Managed Care, Other (non HMO) | Admitting: Internal Medicine

## 2013-07-15 VITALS — BP 126/80 | HR 71 | Ht 66.0 in | Wt 232.0 lb

## 2013-07-15 DIAGNOSIS — D869 Sarcoidosis, unspecified: Secondary | ICD-10-CM

## 2013-07-15 NOTE — Patient Instructions (Signed)
Continue prednisone 10 mg every other day  Nephrology will manage your fluid and calcium balances.   Please call as needd

## 2013-07-15 NOTE — Progress Notes (Signed)
08/22/12- 61 yoF never smoker followed for sarcoid complicated by hypercalcemia, renal insufficency  [facility]-also would like to know about Iron supplement. Dr [name] PCP, Dr [facility]. Last seen here 10/04/08- had been dx'd w/ OSA but disliked CPAP and f/u NPSG 06/25/08 was negative so that issue considered resolved Sarcoid dx'd 2009 - mediastinoscopy and bronchoscopy. Prednisone x 1 year. Now hosp 1/7-1/10/14 for hypercalcemia and renal insufficiency associated with multiple pulmonary nodules of stage II sarcoid. Breast biopsy positive for noncaseating granulomas. Started on prednisone 60 mg daily at that time, anticipating one month to reduce by 5-10 mg increments. She notes that eyes burn and visual acuity is not as sharp, appetite increased on steroids. No night sweats or fever and little cough. ACE 07/23/12  86 (8-52) CT chest 07/23/12 IMPRESSION:  1. While the previously noted mediastinal and bilateral hilar  lymphadenopathy appears to have decreased, there are now  innumerable perilymphatic nodules scattered throughout the lungs  bilaterally, with a definite upper lung predominance. Overall, the  findings are compatible with sarcoidosis (i.e., progression from  stage I to stage II).  Original Report Authenticated By: [name], M.D.  09/25/12- 72 yoF never smoker followed for sarcoid complicated by hypercalcemia, renal insufficency   Dr [name] PCP, Dr Ninetta Lights. FOLLOWS FOR: increased SOB but no wheezing; "just feel winded". She remains on prednisone, started at 60 mg per day on January 10 while in hospital. Now tapering by 10 mg per week and is at 30 mg daily. ACE 08/22/12- down from 86 2 months ago to 26. She says her persistent blinking is because of dry eyes despite use of lubricant from her eye doctor.  11/03/12- 61 yoF never smoker followed for sarcoid complicated by hypercalcemia, renal insufficency   Dr [name] PCP, Dr Ninetta Lights. FOLLOWS FOR: Breathing is unchanged. Reports SOB  with exertion, chest pain and coughing from time to time. Denies chest tightness or wheezing. She did a multiple sclerosis fundraiser walk, stopping on hills. This is an unusual amount of exercise for her. Denies seasonal rhinitis complaints. Continues prednisone 20 mg daily for sarcoid. ACE 08/22/12- 26  CXR 09/25/12 Comparison: Chest x-ray of 04/21/2009 and CT of 07/23/2012.  Findings: The heart is borderline. The hila and mediastinum appear  normal. Right paratracheal thickening on the preceding chest x-ray  is less obvious. The lungs appear clear. Changes of sarcoid seen  on the recent CT examination are not clearly evident on the  radiographs because of the small size of the perilymphatic nodules  noted. No airspace disease. No effusions or pneumothoraces. No  acute bony changes.  IMPRESSION:  No acute findings in the chest.  Original Report Authenticated By: Vallery Ridge, M.D.  12/19/12- 62 yoF never smoker followed for sarcoid complicated by hypercalcemia, renal insufficency   Dr [name] PCP, Dr Ninetta Lights.ROS-see HPI FOLLOWS FOR: still notices SOB with activity Occasionally sore upper sternum, dyspnea with exertion but denies cough more swollen glands. Has been on prednisone 10 mg daily since last visit. ACE 11/03/12- 30 (8-52)   Glucose elevated on steroids CXR 09/25/12 IMPRESSION:  No acute findings in the chest.  Original Report Authenticated By: Vallery Ridge, M.D.  02/06/13- 62 yoF never smoker followed for sarcoid complicated by hypercalcemia, renal insufficency   Dr [name] PCP, Dr Ninetta Lights. FOLLOWS FOR: Pt reports breathing is about the same-- denies any other concerns at this time  has been on 1/2x20 mg prednisone every other day x3 months. Feels well. Describes one day of  transient nonexertional left anterior chest pain associated with migraine. Still, nonradiating, left of sternum. Chronic left knee pain limits exercise. Pending injection to treat.  05/15/13- 89 yoF  never smoker followed for sarcoid complicated by hypercalcemia, renal insufficency   Dr [name] PCP, Dr Ninetta Lights. FOLLOWS FOR: patient states she continues to have SOB with activity; also states she has recently had knee pain/issues/ osteoarthritis. Occasional dry cough. Knee pain limits exertion more than her dyspnea does. Has continued prednisone 10 mg every other day for sarcoid and dyspnea. CXR 02/06/13 Findings: The heart size and pulmonary vascularity are normal and  the lungs are clear. No osseous abnormality. No effusions.  IMPRESSION:  No acute disease.  Original Report Authenticated By: Lorriane Shire, M.D.  07/14/13- 62 yoF never smoker followed for sarcoid complicated by hypercalcemia, renal insufficency   Dr [name] PCP, Dr Ninetta Lights.  Husband died of kidney disease. FOLLOWS FOR: has slight wheezing at times; needs to discuss Prednisone Rx-what to take.  hospital in November with anemia and urinary infection. Was on prednisone 10 mg daily until hospital. Nephrology told her to resume prednisone 10 mg every other day. She was given Lasix 20 mg twice daily. We reviewed her ACE levels recently back up to 86, reflecting interval off of prednisone. We discussed long-term steroids and she will follow directions from nephrology about her calcium management. CXR 05/30/13 IMPRESSION:  Mild chronic lung changes noted, with underlying vascular  congestion. No definite superimposed focal airspace consolidation  seen.  Electronically Signed  By: Garald Balding M.D.  On: 05/30/2013 23:10  ROS- see HPI Constitutional:   No-   weight loss, night sweats, fevers, chills, no-fatigue, lassitude. HEENT:   No-  headaches, difficulty swallowing, tooth/dental problems, sore throat,       No-  sneezing, itching, ear ache, nasal congestion, post nasal drip,  CV:  No- chest pain, no-orthopnea, PND, swelling in lower extremities, anasarca,dizziness, palpitations  Resp: + shortness of breath with exertion or  at rest.              No-   productive cough,  No non-productive cough,  No- coughing up of blood.              No- wheezing.   Skin: No-   rash or lesions. GI:  No-   heartburn, indigestion, abdominal pain, nausea, vomiting,  GU:  MS:  +  joint pain or swelling.   Neuro-     nothing unusual Psych:  No- change in mood or affect. No depression or anxiety.  No memory loss.  OBJ- Physical Exam General- Alert, Oriented, Affect-appropriate, Distress- none acute, overweight Skin- rash-none, lesions- none, excoriation- none Lymphadenopathy- none Head- atraumatic            Eyes- Gross vision intact, PERRLA, conjunctivae clear, frequent blinking            Ears- Hearing, canals-normal            Nose- Clear, no-Septal dev, mucus, polyps, erosion, perforation             Throat- Mallampati II , mucosa clear , drainage- none, tonsils- atrophic Neck- flexible , trachea midline, no stridor , thyroid nl, carotid no bruit Chest - symmetrical excursion , unlabored           Heart/CV- RRR , no murmur , no gallop  , no rub, nl s1 s2                           -  JVD- none , edema+ trace, stasis changes- none, varices- none           Lung- clear to P&A, wheeze- none, cough- none , dullness-none, rub- none           Chest wall-  Abd-  Br/ Gen/ Rectal- Not done, not indicated Extrem- cyanosis- none, clubbing, none, atrophy- none, strength- nl Neuro- +blinking, grimacing, ? Tics or frontal lobe

## 2013-08-08 ENCOUNTER — Encounter: Payer: Self-pay | Admitting: Internal Medicine

## 2013-08-08 NOTE — Assessment & Plan Note (Signed)
Maintaining now prednisone 10 mg every other day with nephrology supervision.

## 2013-08-08 NOTE — Assessment & Plan Note (Signed)
May reflect her sarcoid

## 2013-08-18 ENCOUNTER — Telehealth: Payer: Self-pay

## 2013-08-18 MED ORDER — DILTIAZEM HCL ER COATED BEADS 240 MG PO CP24: 240.0000 mg | ORAL_CAPSULE | Freq: Every day | ORAL | Status: DC

## 2013-08-18 NOTE — Telephone Encounter (Signed)
The pt is hoping to get the medication Cartia 240mg  (generic) called into the [facility] on [address] and [address].

## 2013-08-28 ENCOUNTER — Telehealth: Payer: Self-pay | Admitting: Internal Medicine

## 2013-08-28 ENCOUNTER — Other Ambulatory Visit: Payer: Managed Care, Other (non HMO)

## 2013-08-28 NOTE — Telephone Encounter (Signed)
Patient and lab informed.

## 2013-08-28 NOTE — Telephone Encounter (Signed)
Message copied by Biagio Borg on Fri Aug 28, 2013  9:35 AM ------      Message from: Tara [name]      Created: Fri Aug 28, 2013  9:12 AM       The patient was in the lab today, has appointment on Tuesday 09/01/13.  There was no order in the computer. ------

## 2013-08-28 NOTE — Telephone Encounter (Signed)
This is bc there was no plan for blood work.  When pt seen at [facility] nov 2014, I had asked to return in 6 mo

## 2013-09-01 ENCOUNTER — Encounter: Payer: Managed Care, Other (non HMO) | Admitting: Internal Medicine

## 2013-09-16 ENCOUNTER — Telehealth: Payer: Self-pay | Admitting: *Deleted

## 2013-09-16 MED ORDER — VALSARTAN 320 MG PO TABS: 320.0000 mg | ORAL_TABLET | Freq: Every day | ORAL | Status: DC

## 2013-09-16 NOTE — Telephone Encounter (Signed)
Patient phoned requesting refill on diovan.  Last OV with PCP 06/10/13.  Refilled per protocol and notified patient.

## 2013-09-22 ENCOUNTER — Ambulatory Visit (INDEPENDENT_AMBULATORY_CARE_PROVIDER_SITE_OTHER): Payer: Managed Care, Other (non HMO) | Admitting: Internal Medicine

## 2013-09-22 ENCOUNTER — Encounter: Payer: Self-pay | Admitting: Internal Medicine

## 2013-09-22 ENCOUNTER — Other Ambulatory Visit (INDEPENDENT_AMBULATORY_CARE_PROVIDER_SITE_OTHER): Payer: Managed Care, Other (non HMO)

## 2013-09-22 VITALS — BP 142/82 | HR 72 | Temp 98.0°F | Ht 66.0 in | Wt 232.4 lb

## 2013-09-22 DIAGNOSIS — E099 Drug or chemical induced diabetes mellitus without complications: Secondary | ICD-10-CM

## 2013-09-22 DIAGNOSIS — E139 Other specified diabetes mellitus without complications: Secondary | ICD-10-CM

## 2013-09-22 DIAGNOSIS — Z0000 Encounter for general adult medical examination without abnormal findings: Secondary | ICD-10-CM

## 2013-09-22 DIAGNOSIS — T380X5A Adverse effect of glucocorticoids and synthetic analogues, initial encounter: Secondary | ICD-10-CM

## 2013-09-22 LAB — URINALYSIS, ROUTINE W REFLEX MICROSCOPIC
Bilirubin Urine: NEGATIVE
Ketones, ur: NEGATIVE
Nitrite: POSITIVE — AB
PH: 6 (ref 5.0–8.0)
Specific Gravity, Urine: 1.02 (ref 1.000–1.030)
Total Protein, Urine: NEGATIVE
UROBILINOGEN UA: 0.2 (ref 0.0–1.0)
Urine Glucose: NEGATIVE

## 2013-09-22 LAB — BASIC METABOLIC PANEL
BUN: 25 mg/dL — ABNORMAL HIGH (ref 6–23)
CO2: 28 meq/L (ref 19–32)
CREATININE: 1.6 mg/dL — AB (ref 0.4–1.2)
Calcium: 9.4 mg/dL (ref 8.4–10.5)
Chloride: 104 mEq/L (ref 96–112)
GFR: 34.35 mL/min — ABNORMAL LOW (ref >60.00)
GLUCOSE: 129 mg/dL — AB (ref 70–99)
Potassium: 4 mEq/L (ref 3.5–5.1)
Sodium: 140 mEq/L (ref 135–145)

## 2013-09-22 LAB — HEPATIC FUNCTION PANEL
ALBUMIN: 3.9 g/dL (ref 3.5–5.2)
ALK PHOS: 93 U/L (ref 39–117)
ALT: 35 U/L (ref 0–35)
AST: 24 U/L (ref 0–37)
Bilirubin, Direct: 0 mg/dL (ref 0.0–0.3)
TOTAL PROTEIN: 7.6 g/dL (ref 6.0–8.3)
Total Bilirubin: 0.5 mg/dL (ref 0.3–1.2)

## 2013-09-22 LAB — CBC WITH DIFFERENTIAL/PLATELET
BASOS ABS: 0 10*3/uL (ref 0.0–0.1)
Basophils Relative: 0.3 % (ref 0.0–3.0)
Eosinophils Absolute: 0 10*3/uL (ref 0.0–0.7)
Eosinophils Relative: 0 % (ref 0.0–5.0)
HEMATOCRIT: 37.5 % (ref 36.0–46.0)
HEMOGLOBIN: 12.4 g/dL (ref 12.0–15.0)
LYMPHS PCT: 8.3 % — AB (ref 12.0–46.0)
Lymphs Abs: 0.9 10*3/uL (ref 0.7–4.0)
MCHC: 33.2 g/dL (ref 30.0–36.0)
MCV: 79.9 fl (ref 78.0–100.0)
MONOS PCT: 8.3 % (ref 3.0–12.0)
Monocytes Absolute: 0.9 10*3/uL (ref 0.1–1.0)
NEUTROS ABS: 9.5 10*3/uL — AB (ref 1.4–7.7)
Neutrophils Relative %: 83.1 % — ABNORMAL HIGH (ref 43.0–77.0)
Platelets: 276 10*3/uL (ref 150.0–400.0)
RBC: 4.69 Mil/uL (ref 3.87–5.11)
RDW: 18 % — AB (ref 11.5–14.6)
WBC: 11.4 10*3/uL — AB (ref 4.5–10.5)

## 2013-09-22 LAB — MICROALBUMIN / CREATININE URINE RATIO
Creatinine,U: 88.2 mg/dL
MICROALB UR: 3.8 mg/dL — AB (ref 0.0–1.9)
Microalb Creat Ratio: 4.3 mg/g (ref 0.0–30.0)

## 2013-09-22 LAB — TSH: TSH: 2.53 u[IU]/mL (ref 0.35–5.50)

## 2013-09-22 LAB — LIPID PANEL
CHOL/HDL RATIO: 4
CHOLESTEROL: 261 mg/dL — AB (ref 0–200)
HDL: 64 mg/dL (ref >39.00)
LDL CALC: 133 mg/dL — AB (ref 0–99)
TRIGLYCERIDES: 322 mg/dL — AB (ref 0.0–149.0)
VLDL: 64.4 mg/dL — ABNORMAL HIGH (ref 0.0–40.0)

## 2013-09-22 LAB — HEMOGLOBIN A1C: Hgb A1c MFr Bld: 6.9 % — ABNORMAL HIGH (ref 4.6–6.5)

## 2013-09-22 NOTE — Assessment & Plan Note (Signed)

## 2013-09-22 NOTE — Progress Notes (Signed)
Pre visit review using our clinic review tool, if applicable. No additional management support is needed unless otherwise documented below in the visit note. 

## 2013-09-22 NOTE — Patient Instructions (Signed)
Please continue all other medications as before, and refills have been done if requested. Please have the pharmacy call with any other refills you may need.  Please continue your efforts at being more active, low cholesterol diet, and weight control. You are otherwise up to date with prevention measures today.  Please keep your appointments with your specialists as you have planned - nephrology  Please remember to sign up for MyChart if you have not done so, as this will be important to you in the future with finding out test results, communicating by private email, and scheduling acute appointments online when needed.  Please go to the LAB in the Basement (turn left off the elevator) for the tests to be done today You will be contacted by phone if any changes need to be made immediately.  Otherwise, you will receive a letter about your results with an explanation, but please check with MyChart first.  Please return in 6 months, or sooner if needed, with Lab testing done 3-5 days before

## 2013-09-22 NOTE — Progress Notes (Signed)
Subjective:    Patient ID: Tara [name], female    DOB: 01/01/1951, 63 y.o.   MRN: [id number]  HPI  Here for wellness and f/u;  Overall doing ok;  Pt denies CP, worsening SOB, DOE, wheezing, orthopnea, PND, worsening LE edema, palpitations, dizziness or syncope.  Pt denies neurological change such as new headache, facial or extremity weakness.  Pt denies polydipsia, polyuria, or low sugar symptoms. Pt states overall good compliance with treatment and medications, good tolerability, and has been trying to follow lower cholesterol diet.  Pt denies worsening depressive symptoms, suicidal ideation or panic. No fever, night sweats, wt loss, loss of appetite, or other constitutional symptoms.  Pt states good ability with ADL's, has low fall risk, home safety reviewed and adequate, no other significant changes in hearing or vision, and only occasionally active with exercise.  hospd Nov 2014 with renal insufficiency. Now back on pred 10 mg due to elev calcium per Tara [name].nephrology, also now on calcitonin. Past Medical History  Diagnosis Date  . ANKLE PAIN, LEFT 04/01/2008  . ANXIETY 04/17/2007  . COLONIC POLYPS, HX OF 08/01/2007  . CONTUSIONS, MULTIPLE 04/01/2009  . DEPRESSION 04/17/2007  . DIZZINESS 08/01/2007  . DYSPNEA 08/01/2007  . Enlargement of lymph nodes 08/13/2007  . GLUCOSE INTOLERANCE 08/01/2007  . HYPERLIPIDEMIA 08/01/2007  . HYPERSOMNIA 07/28/2008  . HYPERTENSION 04/17/2007  . JOINT EFFUSION, LEFT KNEE 06/02/2010  . Loose body in knee 04/01/2009  . Morbid obesity 04/20/2007  . OTHER DISEASES OF LUNG NOT ELSEWHERE CLASSIFIED 08/01/2007  . Pain in joint, lower leg 04/01/2009  . PERIPHERAL EDEMA 04/21/2009  . Sarcoidosis 09/25/2007  . SHOULDER PAIN, LEFT 04/01/2009  . Impaired glucose tolerance 03/23/2011  . Oligomenorrhea   . Migraines     "stopped 3-4 yr ago" (07/23/2012)  . 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 Chronic kidney disease    Past Surgical History  Procedure Laterality Date  .  Myomectomy  1994  . Fracture surgery  ?02/1997    "left upper arm; put rod in" (07/23/2012)  . Refractive surgery  08/1998    "both eyes" (07/23/2012)  . Combined mediastinoscopy and bronchoscopy  08/2007  . Gum surgery  2000-?2009    "several ORs; soft tissue graft; took material from roof of mouth" (07/23/2012  . Knee arthroscopy  03/2004; 06/2009    "right; left" Tara. Theda Sers  . Lymph node biopsy  ~ 2009    "for sarcoidosis; don't know exactly which nodes" (07/23/2102)  . Combined mediastinoscopy and bronchoscopy  2009    reports that she has never smoked. She has never used smokeless tobacco. She reports that she drinks alcohol. She reports that she does not use illicit drugs. family history includes Asthma in her sister; Diabetes in her mother; Heart disease (age of onset: 45) in her father; Hypertension in her father, mother, and sister; Stroke in her mother. There is no history of Colon cancer. Allergies  Allergen Reactions  . Floxin [Ocuflox]     shaky  . Fluoxetine Hcl     Suicidal thoughts  . Sulfonamide Derivatives Rash   Current Outpatient Prescriptions on File Prior to Visit  Medication Sig Dispense Refill  . albuterol (PROVENTIL HFA;VENTOLIN HFA) 108 (90 BASE) MCG/ACT inhaler Inhale 1-2 puffs into the lungs every 6 (six) hours as needed for wheezing or shortness of breath.  1 Inhaler  0  . amphetamine-dextroamphetamine (ADDERALL) 30 MG tablet Take 30 mg by mouth 2 (two) times daily.      Marland Kitchen  aspirin 81 MG tablet Take 81 mg by mouth daily.        Marland Kitchen atenolol (TENORMIN) 50 MG tablet TAKE 1 TABLET (50MG  TOTAL) BY MOUTH DAILY  90 tablet  3  . clorazepate (TRANXENE) 7.5 MG tablet Take 7.5 mg by mouth QID.       Marland Kitchen desvenlafaxine (PRISTIQ) 50 MG 24 hr tablet Take 50 mg by mouth daily.      Marland Kitchen diltiazem (CARTIA XT) 240 MG 24 hr capsule Take 1 capsule (240 mg total) by mouth daily.  90 capsule  3  . ferrous sulfate 325 (65 FE) MG tablet Take 325 mg by mouth daily with breakfast.      .  furosemide (LASIX) 40 MG tablet Take 20 mg by mouth 2 (two) times daily.      Marland Kitchen glucosamine-chondroitin 500-400 MG tablet Take 1 tablet by mouth 3 (three) times daily.      . Lancets MISC 1 application by Does not apply route 2 (two) times daily.  200 each  5  . Multiple Vitamin (MULTIVITAMIN) tablet Take 1 tablet by mouth daily.      . valsartan (DIOVAN) 320 MG tablet Take 1 tablet (320 mg total) by mouth daily.  90 tablet  3  . zaleplon (SONATA) 10 MG capsule Take 10 mg by mouth at bedtime.       No current facility-administered medications on file prior to visit.       Review of Systems Constitutional: Negative for diaphoresis, activity change, appetite change or unexpected weight change.  HENT: Negative for hearing loss, ear pain, facial swelling, mouth sores and neck stiffness.   Eyes: Negative for pain, redness and visual disturbance.  Respiratory: Negative for shortness of breath and wheezing.   Cardiovascular: Negative for chest pain and palpitations.  Gastrointestinal: Negative for diarrhea, blood in stool, abdominal distention or other pain Genitourinary: Negative for hematuria, flank pain or change in urine volume.  Musculoskeletal: Negative for myalgias and joint swelling.  Skin: Negative for color change and wound.  Neurological: Negative for syncope and numbness. other than noted Hematological: Negative for adenopathy.  Psychiatric/Behavioral: Negative for hallucinations, self-injury, decreased concentration and agitation.      Objective:   Physical Exam BP 142/82  Pulse 72  Temp(Src) 98 F (36.7 C) (Oral)  Ht 5\' 6"  (1.676 m)  Wt 232 lb 6 oz (105.405 kg)  BMI 37.52 kg/m2  SpO2 99% VS noted,  Constitutional: Pt is oriented to person, place, and time. Appears well-developed and well-nourished. Tara [name] Head: Normocephalic and atraumatic.  Right Ear: External ear normal.  Left Ear: External ear normal.  Nose: Nose normal.  Mouth/Throat: Oropharynx is clear and  moist.  Eyes: Conjunctivae and EOM are normal. Pupils are equal, round, and reactive to light.  Neck: Normal range of motion. Neck supple. No JVD present. No tracheal deviation present.  Cardiovascular: Normal rate, regular rhythm, normal heart sounds and intact distal pulses.   Pulmonary/Chest: Effort normal and breath sounds normal.  Abdominal: Soft. Bowel sounds are normal. There is no tenderness. No HSM  Musculoskeletal: Normal range of motion. Exhibits no edema.  Lymphadenopathy:  Has no cervical adenopathy.  Neurological: Pt is alert and oriented to person, place, and time. Pt has normal reflexes. No cranial nerve deficit.  Skin: Skin is warm and dry. No rash noted.  Psychiatric:  Has  normal mood and affect. Behavior is normal.  Bilat knee bony deg changes noted, no effusion, left some decr ROM, NT  Assessment & Plan:

## 2013-09-22 NOTE — Assessment & Plan Note (Signed)
stable overall by history and exam, recent data reviewed with pt, and pt to continue medical treatment as before,  to f/u any worsening symptoms or concerns Lab Results  Component Value Date   HGBA1C 6.7* 06/03/2013   No longer on metformin due to CKD, for f/u a1c

## 2014-01-01 ENCOUNTER — Other Ambulatory Visit: Payer: Self-pay | Admitting: Internal Medicine

## 2014-01-12 ENCOUNTER — Ambulatory Visit (INDEPENDENT_AMBULATORY_CARE_PROVIDER_SITE_OTHER)
Admission: RE | Admit: 2014-01-12 | Discharge: 2014-01-12 | Disposition: A | Discharge status: Home / Self Care | Payer: Managed Care, Other (non HMO) | Source: Ambulatory Visit | Attending: Internal Medicine | Admitting: Internal Medicine

## 2014-01-12 ENCOUNTER — Encounter: Payer: Self-pay | Admitting: Internal Medicine

## 2014-01-12 ENCOUNTER — Ambulatory Visit (INDEPENDENT_AMBULATORY_CARE_PROVIDER_SITE_OTHER): Payer: Managed Care, Other (non HMO) | Admitting: Internal Medicine

## 2014-01-12 ENCOUNTER — Other Ambulatory Visit: Payer: Managed Care, Other (non HMO)

## 2014-01-12 VITALS — BP 124/72 | HR 69 | Ht 66.0 in | Wt 254.6 lb

## 2014-01-12 DIAGNOSIS — E139 Other specified diabetes mellitus without complications: Secondary | ICD-10-CM

## 2014-01-12 DIAGNOSIS — D869 Sarcoidosis, unspecified: Secondary | ICD-10-CM

## 2014-01-12 DIAGNOSIS — T380X5A Adverse effect of glucocorticoids and synthetic analogues, initial encounter: Secondary | ICD-10-CM

## 2014-01-12 DIAGNOSIS — E099 Drug or chemical induced diabetes mellitus without complications: Secondary | ICD-10-CM

## 2014-01-12 NOTE — Patient Instructions (Signed)
Order- lab- ACE level    Dx sarcoid              CXR   Dx sarcoid  Please call as needed

## 2014-01-12 NOTE — Progress Notes (Signed)
08/22/12- 61 yoF never smoker followed for sarcoid complicated by hypercalcemia, renal insufficency  [facility]-also would like to know about Iron supplement. Dr [name] PCP, Dr [facility]. Last seen here 10/04/08- had been dx'd w/ OSA but disliked CPAP and f/u NPSG 06/25/08 was negative so that issue considered resolved Sarcoid dx'd 2009 - mediastinoscopy and bronchoscopy. Prednisone x 1 year. Now hosp 1/7-1/10/14 for hypercalcemia and renal insufficiency associated with multiple pulmonary nodules of stage II sarcoid. Breast biopsy positive for noncaseating granulomas. Started on prednisone 60 mg daily at that time, anticipating one month to reduce by 5-10 mg increments. She notes that eyes burn and visual acuity is not as sharp, appetite increased on steroids. No night sweats or fever and little cough. ACE 07/23/12  86 (8-52) CT chest 07/23/12 IMPRESSION:  1. While the previously noted mediastinal and bilateral hilar  lymphadenopathy appears to have decreased, there are now  innumerable perilymphatic nodules scattered throughout the lungs  bilaterally, with a definite upper lung predominance. Overall, the  findings are compatible with sarcoidosis (i.e., progression from  stage I to stage II).  Original Report Authenticated By: [name], M.D.  09/25/12- 17 yoF never smoker followed for sarcoid complicated by hypercalcemia, renal insufficency   Dr [name] PCP, Dr Ninetta Lights. FOLLOWS FOR: increased SOB but no wheezing; "just feel winded". She remains on prednisone, started at 60 mg per day on January 10 while in hospital. Now tapering by 10 mg per week and is at 30 mg daily. ACE 08/22/12- down from 86 2 months ago to 26. She says her persistent blinking is because of dry eyes despite use of lubricant from her eye doctor.  11/03/12- 61 yoF never smoker followed for sarcoid complicated by hypercalcemia, renal insufficency   Dr [name] PCP, Dr Ninetta Lights. FOLLOWS FOR: Breathing is unchanged. Reports SOB  with exertion, chest pain and coughing from time to time. Denies chest tightness or wheezing. She did a multiple sclerosis fundraiser walk, stopping on hills. This is an unusual amount of exercise for her. Denies seasonal rhinitis complaints. Continues prednisone 20 mg daily for sarcoid. ACE 08/22/12- 26  CXR 09/25/12 Comparison: Chest x-ray of 04/21/2009 and CT of 07/23/2012.  Findings: The heart is borderline. The hila and mediastinum appear  normal. Right paratracheal thickening on the preceding chest x-ray  is less obvious. The lungs appear clear. Changes of sarcoid seen  on the recent CT examination are not clearly evident on the  radiographs because of the small size of the perilymphatic nodules  noted. No airspace disease. No effusions or pneumothoraces. No  acute bony changes.  IMPRESSION:  No acute findings in the chest.  Original Report Authenticated By: Vallery Ridge, M.D.  12/19/12- 64 yoF never smoker followed for sarcoid complicated by hypercalcemia, renal insufficency   Dr [name] PCP, Dr Ninetta Lights.ROS-see HPI FOLLOWS FOR: still notices SOB with activity Occasionally sore upper sternum, dyspnea with exertion but denies cough more swollen glands. Has been on prednisone 10 mg daily since last visit. ACE 11/03/12- 30 (8-52)   Glucose elevated on steroids CXR 09/25/12 IMPRESSION:  No acute findings in the chest.  Original Report Authenticated By: Vallery Ridge, M.D.  02/06/13- 62 yoF never smoker followed for sarcoid complicated by hypercalcemia, renal insufficency   Dr [name] PCP, Dr Ninetta Lights. FOLLOWS FOR: Pt reports breathing is about the same-- denies any other concerns at this time  has been on 1/2x20 mg prednisone every other day x3 months. Feels well. Describes one day of  transient nonexertional left anterior chest pain associated with migraine. Still, nonradiating, left of sternum. Chronic left knee pain limits exercise. Pending injection to treat.  05/15/13- 51 yoF  never smoker followed for sarcoid complicated by hypercalcemia, renal insufficency   Dr [name] PCP, Dr Ninetta Lights. FOLLOWS FOR: patient states she continues to have SOB with activity; also states she has recently had knee pain/issues/ osteoarthritis. Occasional dry cough. Knee pain limits exertion more than her dyspnea does. Has continued prednisone 10 mg every other day for sarcoid and dyspnea. CXR 02/06/13 Findings: The heart size and pulmonary vascularity are normal and  the lungs are clear. No osseous abnormality. No effusions.  IMPRESSION:  No acute disease.  Original Report Authenticated By: Lorriane Shire, M.D.  07/14/13- 62 yoF never smoker followed for sarcoid complicated by hypercalcemia, renal insufficency   Dr [name] PCP, Dr Ninetta Lights.  Husband died of kidney disease. FOLLOWS FOR: has slight wheezing at times; needs to discuss Prednisone Rx-what to take.  hospital in November with anemia and urinary infection. Was on prednisone 10 mg daily until hospital. Nephrology told her to resume prednisone 10 mg every other day. She was given Lasix 20 mg twice daily. We reviewed her ACE levels recently back up to 86, reflecting interval off of prednisone. We discussed long-term steroids and she will follow directions from nephrology about her calcium management. CXR 05/30/13 IMPRESSION:  Mild chronic lung changes noted, with underlying vascular  congestion. No definite superimposed focal airspace consolidation  seen.  Electronically Signed  By: Garald Balding M.D.  On: 05/30/2013 23:10  01/12/14- 32 yoF never smoker followed for sarcoid complicated by hypercalcemia, renal insufficency   Dr [name] PCP, Dr Ninetta Lights.  Husband died of kidney disease. FOLLOWS FOR: Pt states has occasional chest congestion at times. SOB if carrying alot of weight Tells me she is stable, but can get overheated. Nephrology/Dr Posey Pronto reducing prednisone (given for sarcoid) 10 mg->5 mg daily, to stop in August, for  kidneys. Mentions knees stiff/sore cw osteo, not hot acute arthritis  ROS- see HPI Constitutional:   No-   weight loss, night sweats, fevers, chills, no-fatigue, lassitude. HEENT:   No-  headaches, difficulty swallowing, tooth/dental problems, sore throat,       No-  sneezing, itching, ear ache, nasal congestion, post nasal drip,  CV:  No- chest pain, no-orthopnea, PND, swelling in lower extremities, anasarca,dizziness, palpitations  Resp: + shortness of breath with exertion or at rest.              No-   productive cough,  + non-productive cough,  No- coughing up of blood.              No- wheezing.   Skin: No-   rash or lesions. GI:  No-   heartburn, indigestion, abdominal pain, nausea, vomiting,  GU:  MS:  +  joint pain or swelling.   Neuro-     nothing unusual Psych:  No- change in mood or affect. No depression or anxiety.  No memory loss.  OBJ- Physical Exam General- Alert, Oriented, Affect-appropriate, Distress- none acute, overweight Skin- rash-none, lesions- none, excoriation- none, cosmetics smeared on face Lymphadenopathy- none Head- atraumatic            Eyes- Gross vision intact, PERRLA, conjunctivae clear, frequent blinking            Ears- Hearing, canals-normal            Nose- Clear, no-Septal dev, mucus, polyps, erosion, perforation  Throat- Mallampati II , mucosa clear , drainage- none, tonsils- atrophic Neck- flexible , trachea midline, no stridor , thyroid nl, carotid no bruit Chest - symmetrical excursion , unlabored           Heart/CV- RRR , no murmur , no gallop  , no rub, nl s1 s2                           - JVD- none , edema+ trace, stasis changes- none, varices- none           Lung- clear to P&A, wheeze- none, cough- none , dullness-none, rub- none           Chest wall-  Abd-  Br/ Gen/ Rectal- Not done, not indicated Extrem- cyanosis- none, clubbing, none, atrophy- none, strength- nl Neuro- +blinking, grimacing, ? Tics or frontal lobe ?

## 2014-01-13 LAB — ANGIOTENSIN CONVERTING ENZYME: Angiotensin-Converting Enzyme: 69 U/L — ABNORMAL HIGH (ref 8–52)

## 2014-01-14 DIAGNOSIS — M109 Gout, unspecified: Secondary | ICD-10-CM | POA: Insufficient documentation

## 2014-01-14 NOTE — Assessment & Plan Note (Addendum)
Toe hurts

## 2014-01-14 NOTE — Assessment & Plan Note (Signed)
Clinically stable with stage II-III lung changes, persistent elevation of ACE. Has been on maintenance low dose prednisone, now being reduced. Discussed steroid removal/ adrenal insufficiency. I do not think her arthritis is due to sarcoid.  Plan- ACE level, cxr

## 2014-01-14 NOTE — Assessment & Plan Note (Signed)
Watch sugars as prednisone reduced- PCP

## 2014-03-25 ENCOUNTER — Other Ambulatory Visit (INDEPENDENT_AMBULATORY_CARE_PROVIDER_SITE_OTHER): Payer: Managed Care, Other (non HMO)

## 2014-03-25 ENCOUNTER — Ambulatory Visit (INDEPENDENT_AMBULATORY_CARE_PROVIDER_SITE_OTHER): Payer: Managed Care, Other (non HMO) | Admitting: Internal Medicine

## 2014-03-25 ENCOUNTER — Encounter: Payer: Self-pay | Admitting: Internal Medicine

## 2014-03-25 VITALS — BP 162/90 | HR 75 | Temp 97.9°F | Wt 256.2 lb

## 2014-03-25 DIAGNOSIS — R7989 Other specified abnormal findings of blood chemistry: Secondary | ICD-10-CM

## 2014-03-25 DIAGNOSIS — I10 Essential (primary) hypertension: Secondary | ICD-10-CM

## 2014-03-25 DIAGNOSIS — IMO0001 Reserved for inherently not codable concepts without codable children: Secondary | ICD-10-CM

## 2014-03-25 DIAGNOSIS — H9192 Unspecified hearing loss, left ear: Secondary | ICD-10-CM

## 2014-03-25 DIAGNOSIS — E1165 Type 2 diabetes mellitus with hyperglycemia: Secondary | ICD-10-CM

## 2014-03-25 DIAGNOSIS — Z23 Encounter for immunization: Secondary | ICD-10-CM

## 2014-03-25 DIAGNOSIS — M25569 Pain in unspecified knee: Secondary | ICD-10-CM

## 2014-03-25 DIAGNOSIS — H9190 Unspecified hearing loss, unspecified ear: Secondary | ICD-10-CM

## 2014-03-25 LAB — HEPATIC FUNCTION PANEL
ALBUMIN: 3.6 g/dL (ref 3.5–5.2)
ALT: 23 U/L (ref 0–35)
AST: 25 U/L (ref 0–37)
Alkaline Phosphatase: 113 U/L (ref 39–117)
Bilirubin, Direct: 0 mg/dL (ref 0.0–0.3)
Total Bilirubin: 0.3 mg/dL (ref 0.2–1.2)
Total Protein: 7.3 g/dL (ref 6.0–8.3)

## 2014-03-25 LAB — LIPID PANEL
CHOLESTEROL: 205 mg/dL — AB (ref 0–200)
HDL: 36.7 mg/dL — ABNORMAL LOW (ref >39.00)
NonHDL: 168.3
TRIGLYCERIDES: 280 mg/dL — AB (ref 0.0–149.0)
Total CHOL/HDL Ratio: 6
VLDL: 56 mg/dL — AB (ref 0.0–40.0)

## 2014-03-25 LAB — BASIC METABOLIC PANEL
BUN: 16 mg/dL (ref 6–23)
CHLORIDE: 98 meq/L (ref 96–112)
CO2: 28 mEq/L (ref 19–32)
Calcium: 12.3 mg/dL — ABNORMAL HIGH (ref 8.4–10.5)
Creatinine, Ser: 1.6 mg/dL — ABNORMAL HIGH (ref 0.4–1.2)
GFR: 34.05 mL/min — ABNORMAL LOW (ref >60.00)
Glucose, Bld: 110 mg/dL — ABNORMAL HIGH (ref 70–99)
Potassium: 3.8 mEq/L (ref 3.5–5.1)
Sodium: 134 mEq/L — ABNORMAL LOW (ref 135–145)

## 2014-03-25 LAB — LDL CHOLESTEROL, DIRECT: LDL DIRECT: 98 mg/dL

## 2014-03-25 LAB — HEMOGLOBIN A1C: Hgb A1c MFr Bld: 6.2 % (ref 4.6–6.5)

## 2014-03-25 MED ORDER — HYDROCHLOROTHIAZIDE 25 MG PO TABS: 25.0000 mg | ORAL_TABLET | Freq: Every day | ORAL | Status: DC

## 2014-03-25 NOTE — Assessment & Plan Note (Signed)
Unconttrolled, with recent wt gain, and worsening LE edema, will add HCT 25 qd since cr improved last check to 1.6, f/u 1 month, to check BP at home as well BP Readings from Last 3 Encounters:  03/25/14 162/90  01/12/14 124/72  09/22/13 142/82

## 2014-03-25 NOTE — Patient Instructions (Addendum)
You had the flu shot today  Your left ear was irrigated of wax today  Please take all new medication as prescribed - the fluid pill (HCT 25 mg per day)  Please continue all other medications as before, and refills have been done if requested.  Please have the pharmacy call with any other refills you may need.  Please continue your efforts at being more active, low cholesterol diet, and weight control.  You are otherwise up to date with prevention measures today.  Please keep your appointments with your specialists as you may have planned  You will be contacted regarding the referral for: Dr Tamala Julian - sport med for the knees  Please go to the LAB in the Basement (turn left off the elevator) for the tests to be done today  You will be contacted by phone if any changes need to be made immediately.  Otherwise, you will receive a letter about your results with an explanation, but please check with MyChart first.  Please remember to sign up for MyChart if you have not done so, as this will be important to you in the future with finding out test results, communicating by private email, and scheduling acute appointments online when needed.  Please return in 1 month, or sooner if needed

## 2014-03-25 NOTE — Assessment & Plan Note (Signed)
Improved with wax irrigation

## 2014-03-25 NOTE — Progress Notes (Signed)
Subjective:    Patient ID: Tara [name], female    DOB: 1950/08/04, 63 y.o.   MRN: [id number]  HPI  Here to f/u; overall doing ok,  Pt denies chest pain, increased sob or doe, wheezing, orthopnea, PND, palpitations, dizziness or syncope, but has had midl worsening acute on chronic edema to legs, not clear if related to bilat knee effusions vs other, last echo 2010 with normal EF, + LVH. Has been on lasix 40 in the past, stopped per Dr Patel/renal. Has also ongoing knee pain, s/p left arthroscopy x 2, but both per pt with near end stage DJD, trying to avoid surgury, has seen Dr Theda Sers in past.  Pt denies polydipsia, polyuria, or low sugar symptoms such as weakness or confusion improved with po intake.  Pt denies new neurological symptoms such as new headache, or facial or extremity weakness or numbness.   Pt states overall good compliance with meds, has been trying to follow lower cholesterol, diabetic diet, with wt overall stable,  but little exercise however. Has BP monitor at home, not tried to use yet, BP recently have been controlled at provider visits.  Has gained at least 10 lbs since nov 2014.  Does c/o ongoing fatigue, but denies signficant daytime hypersomnolence during workdays, but often sleeps during day on weekends.  Used to be CPAP for borderline need for tx, did not think helped at the time or very little.  Also with left hearing loss - ? Wax, needs irrigated.  Due for flu shot. Past Medical History  Diagnosis Date  . ANKLE PAIN, LEFT 04/01/2008  . ANXIETY 04/17/2007  . COLONIC POLYPS, HX OF 08/01/2007  . CONTUSIONS, MULTIPLE 04/01/2009  . DEPRESSION 04/17/2007  . DIZZINESS 08/01/2007  . DYSPNEA 08/01/2007  . Enlargement of lymph nodes 08/13/2007  . GLUCOSE INTOLERANCE 08/01/2007  . HYPERLIPIDEMIA 08/01/2007  . HYPERSOMNIA 07/28/2008  . HYPERTENSION 04/17/2007  . JOINT EFFUSION, LEFT KNEE 06/02/2010  . Loose body in knee 04/01/2009  . Morbid obesity 04/20/2007  . OTHER DISEASES OF LUNG NOT  ELSEWHERE CLASSIFIED 08/01/2007  . Pain in joint, lower leg 04/01/2009  . PERIPHERAL EDEMA 04/21/2009  . Sarcoidosis 09/25/2007  . SHOULDER PAIN, LEFT 04/01/2009  . Impaired glucose tolerance 03/23/2011  . Oligomenorrhea   . Migraines     "stopped 3-4 yr ago" (07/23/2012)  . 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 Chronic kidney disease    Past Surgical History  Procedure Laterality Date  . Myomectomy  1994  . Fracture surgery  ?02/1997    "left upper arm; put rod in" (07/23/2012)  . Refractive surgery  08/1998    "both eyes" (07/23/2012)  . Combined mediastinoscopy and bronchoscopy  08/2007  . Gum surgery  2000-?2009    "several ORs; soft tissue graft; took material from roof of mouth" (07/23/2012  . Knee arthroscopy  03/2004; 06/2009    "right; left" Dr. Theda Sers  . Lymph node biopsy  ~ 2009    "for sarcoidosis; don't know exactly which nodes" (07/23/2102)  . Combined mediastinoscopy and bronchoscopy  2009    reports that she has never smoked. She has never used smokeless tobacco. She reports that she drinks alcohol. She reports that she does not use illicit drugs. family history includes Asthma in her sister; Diabetes in her mother; Heart disease (age of onset: 46) in her father; Hypertension in her father, mother, and sister; Stroke in her mother. There is no history of Colon cancer. Allergies  Allergen Reactions  . Floxin [  Ocuflox]     shaky  . Fluoxetine Hcl     Suicidal thoughts  . Sulfonamide Derivatives Rash   Current Outpatient Prescriptions on File Prior to Visit  Medication Sig Dispense Refill  . amphetamine-dextroamphetamine (ADDERALL) 30 MG tablet Take 30 mg by mouth 2 (two) times daily.      Marland Kitchen aspirin 81 MG tablet Take 81 mg by mouth daily.        Marland Kitchen atenolol (TENORMIN) 50 MG tablet TAKE 1 TABLET BY MOUTH EVERY NIGHT AT BEDTIME  90 tablet  3  . clorazepate (TRANXENE) 7.5 MG tablet Take 7.5 mg by mouth QID.       Marland Kitchen desvenlafaxine (PRISTIQ) 50 MG 24 hr tablet Take 50 mg by mouth  daily.      Marland Kitchen diltiazem (CARTIA XT) 240 MG 24 hr capsule Take 1 capsule (240 mg total) by mouth daily.  90 capsule  3  . ferrous sulfate 325 (65 FE) MG tablet Take 325 mg by mouth daily with breakfast.      . glucosamine-chondroitin 500-400 MG tablet Take 1 tablet by mouth 3 (three) times daily.      . Multiple Vitamin (MULTIVITAMIN) tablet Take 1 tablet by mouth daily.      . valsartan (DIOVAN) 320 MG tablet Take 1 tablet (320 mg total) by mouth daily.  90 tablet  3  . zaleplon (SONATA) 10 MG capsule Take 10 mg by mouth at bedtime.       No current facility-administered medications on file prior to visit.    Review of Systems  Constitutional: Negative for unusual diaphoresis or other sweats  HENT: Negative for ringing in ear Eyes: Negative for double vision or worsening visual disturbance.  Respiratory: Negative for choking and stridor.   Gastrointestinal: Negative for vomiting or other signifcant bowel change Genitourinary: Negative for hematuria or decreased urine volume.  Musculoskeletal: Negative for other MSK pain or swelling Skin: Negative for color change and worsening wound.  Neurological: Negative for tremors and numbness other than noted  Psychiatric/Behavioral: Negative for decreased concentration or agitation other than above       Objective:   Physical Exam BP 162/90  Pulse 75  Temp(Src) 97.9 F (36.6 C) (Oral)  Wt 256 lb 4 oz (116.234 kg)  SpO2 95% VS noted,  Constitutional: Pt appears well-developed, well-nourished.  HENT: Head: NCAT.  Right Ear: External ear normal.  Left Ear: External ear normal.  Left ear wax irrigated successfully Eyes: . Pupils are equal, round, and reactive to light. Conjunctivae and EOM are normal Neck: Normal range of motion. Neck supple.  Cardiovascular: Normal rate and regular rhythm.   Pulmonary/Chest: Effort normal and breath sounds normal.  Abd:  Soft, NT, ND, + BS Neurological: Pt is alert. Not confused , motor grossly  intact Skin: Skin is warm. No rash, but 2+ edema to knees Psychiatric: Pt behavior is normal. No agitation.  Bilat knees NT, but small effusion, decr ROM, mild crepitus    Assessment & Plan:   BP Readings from Last 3 Encounters:  03/25/14 162/90  01/12/14 124/72  09/22/13 142/82   Wt Readings from Last 3 Encounters:  03/25/14 256 lb 4 oz (116.234 kg)  01/12/14 254 lb 9.6 oz (115.486 kg)  09/22/13 232 lb 6 oz (105.405 kg)

## 2014-03-25 NOTE — Progress Notes (Signed)
Pre visit review using our clinic review tool, if applicable. No additional management support is needed unless otherwise documented below in the visit note. 

## 2014-03-25 NOTE — Assessment & Plan Note (Signed)
Also for referral DR smith/sport med

## 2014-03-31 ENCOUNTER — Encounter: Payer: Self-pay | Admitting: Internal Medicine

## 2014-04-13 ENCOUNTER — Ambulatory Visit (INDEPENDENT_AMBULATORY_CARE_PROVIDER_SITE_OTHER)
Admission: RE | Admit: 2014-04-13 | Discharge: 2014-04-13 | Disposition: A | Discharge status: Home / Self Care | Payer: Managed Care, Other (non HMO) | Source: Ambulatory Visit | Attending: Family Medicine | Admitting: Family Medicine

## 2014-04-13 ENCOUNTER — Ambulatory Visit (INDEPENDENT_AMBULATORY_CARE_PROVIDER_SITE_OTHER): Payer: Managed Care, Other (non HMO) | Admitting: Family Medicine

## 2014-04-13 ENCOUNTER — Encounter: Payer: Self-pay | Admitting: Family Medicine

## 2014-04-13 VITALS — BP 130/84 | HR 78 | Ht 66.0 in | Wt 247.0 lb

## 2014-04-13 DIAGNOSIS — M25569 Pain in unspecified knee: Secondary | ICD-10-CM

## 2014-04-13 DIAGNOSIS — M25562 Pain in left knee: Principal | ICD-10-CM

## 2014-04-13 DIAGNOSIS — M25561 Pain in right knee: Secondary | ICD-10-CM

## 2014-04-13 DIAGNOSIS — M1710 Unilateral primary osteoarthritis, unspecified knee: Secondary | ICD-10-CM | POA: Insufficient documentation

## 2014-04-13 MED ORDER — FUROSEMIDE 20 MG PO TABS: 20.0000 mg | ORAL_TABLET | Freq: Every day | ORAL | Status: DC

## 2014-04-13 NOTE — Patient Instructions (Signed)
Good to see you Ice 20 minutes 2 times daily. Usually after activity and before bed. Exercises 3 times a week.  Wear the brace with the activity.  Physical therapy will be calling you.  Take tylenol 650 mg three times a day is the best evidence based medicine we have for arthritis.  Glucosamine sulfate 750mg  twice a day is a supplement that has been shown to help moderate to severe arthritis. Vitamin D 1000 IU daily but no extra calcium! I will send Dr. Posey Pronto my note and see what he thinks.  Fish oil 2 grams daily.  Tumeric 500mg  twice daily.  Capsaicin topically up to four times a day may also help with pain. Cortisone injections are an option if these interventions do not seem to make a difference or need more relief.  If cortisone injections do not help, there are different types of shots that may help but they take longer to take effect.  We can discuss this at follow up.  It's important that you continue to stay active. Controlling your weight is important.  Consider physical therapy to strengthen muscles around the joint that hurts to take pressure off of the joint itself. Shoe inserts with good arch support may be helpful.  [employer] orth[facility] at [employer] [facility] could help.  Water aerobics and cycling with low resistance are the best two types of exercise for arthritis. Come back and see me in 3 weeks.

## 2014-04-13 NOTE — Assessment & Plan Note (Signed)
Patient was given an injection today. I did not put patient on any other oral long term medications secondary to patient's other comorbidities. Patient's sarcoidosis and steroid induced diabetes likely unfortunately increase her likelihood of having advanced arch arthritic changes. X-rays were ordered today for further evaluation. I do feel that patient's last vitamin D being 31 and being on the low side of normal and recent evidence showing that vitamin D supplementation without calcium can be helpful in sarcoidosis patient is going to start on vitamin D 1000 international units daily. We will discuss with patient's nephrologist as well. Patient was given a brace was fitted by me as well. The discussed home exercises as well as over-the-counter medications that would be safe for her other comorbidities. Patient is going to formal physical therapy. Patient will come back and see me in 3-4 weeks. Continuing to have difficulty she would be a candidate for viscous supplementation.  Patient being started on vitamin D she is going to have her labs drawn and 1-2 weeks and then we'll be checking her calcium level.

## 2014-04-13 NOTE — Addendum Note (Signed)
Addended by: Biagio Borg on: 04/13/2014 08:23 PM   Modules accepted: Orders, Medications

## 2014-04-13 NOTE — Progress Notes (Signed)
[name] [address] [place], [place] [address] Phone: [phone] Subjective:    I'm seeing this patient by the request  of:  Cathlean Cower, MD   CC: Left knee pain  [id number] Tara [name] is a 63 y.o. female coming in with complaint of knee pain. Patient has had knee pain since 2010. Patient has actually had to arthroscopic procedures done on this knee for severe arthritis she states. Patient has been seen by [facility] per orthopedics. Patient states though that they have told her that all she can do is have a knee replacement at this time and she would like to avoid this. Patient was last seen by them greater than a year ago. Patient states that over the course of the last several months she's been having increasing pain again. Patient states that the pain is all over her knee. This does cause her to have difficulty with walking. Patient states patient has difficulty with medications secondary to her other comorbidities and tries to avoid them at all costs. Patient states that sometimes her left knee feels like it is going to give out on her. Patient has no she's been putting more stress on her right knee recently as well. She rates the severity a 7/10. Once again patient would like to avoid surgical intervention if all possible.     Past medical history, social, surgical and family history all reviewed in electronic medical record.   Review of Systems: No headache, visual changes, nausea, vomiting, diarrhea, constipation, dizziness, abdominal pain, skin rash, fevers, chills, night sweats, weight loss, swollen lymph nodes, body aches, joint swelling, muscle aches, chest pain, shortness of breath, mood changes.   Objective Blood pressure 130/84, pulse 78, height 5\' 6"  (1.676 m), weight 247 lb (112.038 kg), SpO2 96.00%.  General: No apparent distress alert and oriented x3 mood and affect normal, dressed appropriately. Hirsutism, overweight HEENT: Pupils equal,  extraocular movements intact  Respiratory: Patient's speak in full sentences and does not appear short of breath  Cardiovascular: No lower extremity edema, non tender, no erythema  Skin: Warm dry intact with no signs of infection or rash on extremities or on axial skeleton.  Abdomen: Soft nontender  Neuro: Cranial nerves II through XII are intact, neurovascularly intact in all extremities with 2+ DTRs and 2+ pulses.  Lymph: No lymphadenopathy of posterior or anterior cervical chain or axillae bilaterally.  Severe antalgic gait MSK:  Non tender with full range of motion and good stability and symmetric strength and tone of shoulders, elbows, wrist, hip, and ankles bilaterally.  Knee: The left Bilateral knees have valgus deformity at baseline Moderate tenderness to palpation mostly over the medial and lateral joint lines. ROM full in flexion and extension and lower leg rotation. Ligaments with solid consistent endpoints including ACL, PCL, LCL, MCL. Negative Mcmurray's, Apley's, and Thessalonian tests.  painful patellar compression. Patellar glide severe crepitus. Patellar and quadriceps tendons unremarkable. Hamstring and quadriceps strength is normal.   Procedure: Real-time Ultrasound Guided Injection of left knee Device: GE Logiq E  Ultrasound guided injection is preferred based studies that show increased duration, increased effect, greater accuracy, decreased procedural pain, increased response rate, and decreased cost with ultrasound guided versus blind injection.  Verbal informed consent obtained.  Time-out conducted.  Noted no overlying erythema, induration, or other signs of local infection.  Skin prepped in a sterile fashion.  Local anesthesia: Topical Ethyl chloride.  With sterile technique and under real time ultrasound guidance: With a 22-gauge  2 inch needle patient was injected with 4 cc of 0.5% Marcaine and 1 cc of Kenalog 40 mg/dL. This was from a superior lateral approach.    Completed without difficulty  Pain immediately resolved suggesting accurate placement of the medication.  Advised to call if fevers/chills, erythema, induration, drainage, or persistent bleeding.  Images permanently stored and available for review in the ultrasound unit.  Impression: Technically successful ultrasound guided injection.     Impression and Recommendations:     This case required medical decision making of moderate complexity.

## 2014-04-13 NOTE — Assessment & Plan Note (Signed)
Likely secondary to her sarcoid. We will continue to monitor. Patient to is on hydrochlorothiazide which can increase her calcium levels as well. We may want to consider changing patient's diuresis medication. We will discuss with primary care provider. We'll continue to monitor with patient started on vitamin D supplementation but at a lower dose.

## 2014-04-13 NOTE — Progress Notes (Signed)
Robin to contact pt  Dr Tamala Julian noted recent elev calcium, and I agree we should stop the HCTZ , and start lasis 20 qd - a new rx has been sent to her pharmacy

## 2014-04-27 ENCOUNTER — Encounter: Payer: Self-pay | Admitting: Internal Medicine

## 2014-04-27 ENCOUNTER — Other Ambulatory Visit (INDEPENDENT_AMBULATORY_CARE_PROVIDER_SITE_OTHER): Payer: Managed Care, Other (non HMO)

## 2014-04-27 ENCOUNTER — Ambulatory Visit (INDEPENDENT_AMBULATORY_CARE_PROVIDER_SITE_OTHER): Payer: Managed Care, Other (non HMO) | Admitting: Internal Medicine

## 2014-04-27 DIAGNOSIS — I10 Essential (primary) hypertension: Secondary | ICD-10-CM

## 2014-04-27 DIAGNOSIS — E871 Hypo-osmolality and hyponatremia: Secondary | ICD-10-CM

## 2014-04-27 LAB — BASIC METABOLIC PANEL
BUN: 20 mg/dL (ref 6–23)
CALCIUM: 11.5 mg/dL — AB (ref 8.4–10.5)
CO2: 26 mEq/L (ref 19–32)
CREATININE: 1.8 mg/dL — AB (ref 0.4–1.2)
Chloride: 96 mEq/L (ref 96–112)
GFR: 31.37 mL/min — AB (ref >60.00)
Glucose, Bld: 95 mg/dL (ref 70–99)
Potassium: 3.4 mEq/L — ABNORMAL LOW (ref 3.5–5.1)
Sodium: 130 mEq/L — ABNORMAL LOW (ref 135–145)

## 2014-04-27 MED ORDER — HYDROCHLOROTHIAZIDE 25 MG PO TABS: 25.0000 mg | ORAL_TABLET | Freq: Every day | ORAL | Status: DC

## 2014-04-27 NOTE — Patient Instructions (Addendum)

## 2014-04-27 NOTE — Assessment & Plan Note (Signed)
Exam stable, for f/u today

## 2014-04-27 NOTE — Progress Notes (Signed)
Pre visit review using our clinic review tool, if applicable. No additional management support is needed unless otherwise documented below in the visit note. 

## 2014-04-27 NOTE — Progress Notes (Signed)
Subjective:    Patient ID: Tara [name], female    DOB: September 18, 1950, 63 y.o.   MRN: [id number]  HPI  Here to f/u; overall doing ok,  Pt denies chest pain, increased sob or doe, wheezing, orthopnea, PND, increased LE swelling, palpitations, dizziness or syncope.  Pt denies polydipsia, polyuria, or low sugar symptoms such as weakness or confusion improved with po intake.  Pt denies new neurological symptoms such as new headache, or facial or extremity weakness or numbness.   Pt states overall good compliance with meds, has been trying to follow lower cholesterol diet, with wt overall stable.  Did not change the hct 25 to lasix 20 per phone note. Asks for re-check Calcium today.  Knee pain much improved after seeing Dr Tamala Julian for cortisone shot Past Medical History  Diagnosis Date  . ANKLE PAIN, LEFT 04/01/2008  . ANXIETY 04/17/2007  . COLONIC POLYPS, HX OF 08/01/2007  . CONTUSIONS, MULTIPLE 04/01/2009  . DEPRESSION 04/17/2007  . DIZZINESS 08/01/2007  . DYSPNEA 08/01/2007  . Enlargement of lymph nodes 08/13/2007  . GLUCOSE INTOLERANCE 08/01/2007  . HYPERLIPIDEMIA 08/01/2007  . HYPERSOMNIA 07/28/2008  . HYPERTENSION 04/17/2007  . JOINT EFFUSION, LEFT KNEE 06/02/2010  . Loose body in knee 04/01/2009  . Morbid obesity 04/20/2007  . OTHER DISEASES OF LUNG NOT ELSEWHERE CLASSIFIED 08/01/2007  . Pain in joint, lower leg 04/01/2009  . PERIPHERAL EDEMA 04/21/2009  . Sarcoidosis 09/25/2007  . SHOULDER PAIN, LEFT 04/01/2009  . Impaired glucose tolerance 03/23/2011  . Oligomenorrhea   . Migraines     "stopped 3-4 yr ago" (07/23/2012)  . 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 Chronic kidney disease    Past Surgical History  Procedure Laterality Date  . Myomectomy  1994  . Fracture surgery  ?02/1997    "left upper arm; put rod in" (07/23/2012)  . Refractive surgery  08/1998    "both eyes" (07/23/2012)  . Combined mediastinoscopy and bronchoscopy  08/2007  . Gum surgery  2000-?2009    "several ORs; soft tissue graft;  took material from roof of mouth" (07/23/2012  . Knee arthroscopy  03/2004; 06/2009    "right; left" Dr. Theda Sers  . Lymph node biopsy  ~ 2009    "for sarcoidosis; don't know exactly which nodes" (07/23/2102)  . Combined mediastinoscopy and bronchoscopy  2009    reports that she has never smoked. She has never used smokeless tobacco. She reports that she drinks alcohol. She reports that she does not use illicit drugs. family history includes Asthma in her sister; Diabetes in her mother; Heart disease (age of onset: 10) in her father; Hypertension in her father, mother, and sister; Stroke in her mother. There is no history of Colon cancer. Allergies  Allergen Reactions  . Floxin [Ocuflox]     shaky  . Fluoxetine Hcl     Suicidal thoughts  . Sulfonamide Derivatives Rash   Current Outpatient Prescriptions on File Prior to Visit  Medication Sig Dispense Refill  . amphetamine-dextroamphetamine (ADDERALL) 30 MG tablet Take 30 mg by mouth 2 (two) times daily.      Marland Kitchen aspirin 81 MG tablet Take 81 mg by mouth daily.        Marland Kitchen atenolol (TENORMIN) 50 MG tablet TAKE 1 TABLET BY MOUTH EVERY NIGHT AT BEDTIME  90 tablet  3  . clorazepate (TRANXENE) 7.5 MG tablet Take 7.5 mg by mouth QID.       Marland Kitchen desvenlafaxine (PRISTIQ) 50 MG 24 hr tablet Take 50 mg by  mouth daily.      Marland Kitchen diltiazem (CARTIA XT) 240 MG 24 hr capsule Take 1 capsule (240 mg total) by mouth daily.  90 capsule  3  . ferrous sulfate 325 (65 FE) MG tablet Take 325 mg by mouth daily with breakfast.      . glucosamine-chondroitin 500-400 MG tablet Take 1 tablet by mouth 3 (three) times daily.      . Multiple Vitamin (MULTIVITAMIN) tablet Take 1 tablet by mouth daily.      . valsartan (DIOVAN) 320 MG tablet Take 1 tablet (320 mg total) by mouth daily.  90 tablet  3  . zaleplon (SONATA) 10 MG capsule Take 10 mg by mouth at bedtime.       No current facility-administered medications on file prior to visit.     Review of Systems  Constitutional:  Negative for unusual diaphoresis or other sweats  HENT: Negative for ringing in ear Eyes: Negative for double vision or worsening visual disturbance.  Respiratory: Negative for choking and stridor.   Gastrointestinal: Negative for vomiting or other signifcant bowel change Genitourinary: Negative for hematuria or decreased urine volume.  Musculoskeletal: Negative for other MSK pain or swelling Skin: Negative for color change and worsening wound.  Neurological: Negative for tremors and numbness other than noted  Psychiatric/Behavioral: Negative for decreased concentration or agitation other than above       Objective:   Physical Exam BP 120/72  Pulse 81  Temp(Src) 97.7 F (36.5 C) (Oral)  Wt 245 lb 8 oz (111.358 kg)  SpO2 97% VS noted,  Constitutional: Pt appears well-developed, well-nourished.  HENT: Head: NCAT.  Right Ear: External ear normal.  Left Ear: External ear normal.  Eyes: . Pupils are equal, round, and reactive to light. Conjunctivae and EOM are normal Neck: Normal range of motion. Neck supple.  Cardiovascular: Normal rate and regular rhythm.   Pulmonary/Chest: Effort normal and breath sounds normal.  Abd:  Soft, NT, ND, + BS Neurological: Pt is alert. Not confused , motor grossly intact Skin: Skin is warm. No rash Psychiatric: Pt behavior is normal. No agitation.  Trace ankle edema bilat       Assessment & Plan:

## 2014-04-27 NOTE — Assessment & Plan Note (Signed)
stable overall by history and exam, recent data reviewed with pt, and pt to continue medical treatment as before,  to f/u any worsening symptoms or concerns BP Readings from Last 3 Encounters:  04/27/14 120/72  04/13/14 130/84  03/25/14 162/90

## 2014-04-27 NOTE — Assessment & Plan Note (Signed)
For calcium f/u, also PTH level, cont hct for now but consider d/c for low sodium or persistent elev calcium

## 2014-04-28 ENCOUNTER — Other Ambulatory Visit: Payer: Self-pay | Admitting: Internal Medicine

## 2014-05-04 ENCOUNTER — Ambulatory Visit (INDEPENDENT_AMBULATORY_CARE_PROVIDER_SITE_OTHER): Payer: Managed Care, Other (non HMO) | Admitting: Family Medicine

## 2014-05-04 ENCOUNTER — Encounter: Payer: Self-pay | Admitting: Family Medicine

## 2014-05-04 VITALS — BP 140/82 | HR 65 | Ht 66.0 in | Wt 249.0 lb

## 2014-05-04 DIAGNOSIS — M179 Osteoarthritis of knee, unspecified: Secondary | ICD-10-CM

## 2014-05-04 DIAGNOSIS — M1710 Unilateral primary osteoarthritis, unspecified knee: Secondary | ICD-10-CM

## 2014-05-04 NOTE — Patient Instructions (Signed)
You are doing amazing.  Physical therapy will be great! Ice is your friend at night Try to wear the brace a little less especially at home.  Continue the exercises.  See me again in 2 weeks.

## 2014-05-04 NOTE — Assessment & Plan Note (Signed)
Patient is doing remarkably well one month after having a intra-articular injection. Patient is close to 100% she states. I am hoping that this continues. Patient is going to start formal physical therapy though next week which I think will also be beneficial. We discussed continuing the icing and bracing as needed. Patient then is going to continue the over-the-counter medications. Patient will come back and see me again in 2 months for further evaluation and treatment.

## 2014-05-04 NOTE — Progress Notes (Signed)
  [name] [place], [place] [address] Phone: [phone] Subjective:    I'm seeing this patient by the request  of:  Tara Cower, MD   CC: Left knee pain  [id number] Tara [name] is a 63 y.o. female coming in with complaint of knee pain. Patient has had knee pain since 2010. Patient has actually had to arthroscopic procedures done on this knee for severe arthritis she states. Patient has been seen [name]. Patient states though that they have told her that all she can do is have a knee replacement at this time and she would like to avoid this.   Patient was seen 3 weeks ago and was given a corticosteroid injection. Patient states that she is approximately 95-100% better. Some mild discomfort at the end of the day but as long as she wears her brace she seems to be doing significantly well. Patient denies any new symptoms. Denies any swelling. Patient is very happy with the results and is resting comfortably at night.     Past medical history, social, surgical and family history all reviewed in electronic medical record.   Review of Systems: No headache, visual changes, nausea, vomiting, diarrhea, constipation, dizziness, abdominal pain, skin rash, fevers, chills, night sweats, weight loss, swollen lymph nodes, body aches, joint swelling, muscle aches, chest pain, shortness of breath, mood changes.   Objective Blood pressure 140/82, pulse 65, weight 249 lb (112.946 kg), SpO2 99.00%.  General: No apparent distress alert and oriented x3 mood and affect normal, dressed appropriately. Hirsutism, overweight HEENT: Pupils equal, extraocular movements intact  Respiratory: Patient's speak in full sentences and does not appear short of breath  Cardiovascular: No lower extremity edema, non tender, no erythema  Skin: Warm dry intact with no signs of infection or rash on extremities or on axial skeleton.  Abdomen: Soft nontender    Neuro: Cranial nerves II through XII are intact, neurovascularly intact in all extremities with 2+ DTRs and 2+ pulses.  Lymph: No lymphadenopathy of posterior or anterior cervical chain or axillae bilaterally.  Severe antalgic gait MSK:  Non tender with full range of motion and good stability and symmetric strength and tone of shoulders, elbows, wrist, hip, and ankles bilaterally.  Knee: left Bilateral knees have valgus deformity at baseline Mild tenderness over the medial joint line but non-tender over the lateral joint line ROM full in flexion and extension and lower leg rotation. Ligaments with solid consistent endpoints including ACL, PCL, LCL, MCL. Negative Mcmurray's, Apley's, and Thessalonian tests.  Non painful patellar compression. Patellar glide severe crepitus. Patellar and quadriceps tendons unremarkable. Hamstring and quadriceps strength is normal.      Impression and Recommendations:     This case required medical decision making of moderate complexity.

## 2014-05-05 ENCOUNTER — Ambulatory Visit: Payer: Managed Care, Other (non HMO) | Admitting: Physical Therapy

## 2014-05-06 ENCOUNTER — Other Ambulatory Visit: Payer: Self-pay

## 2014-05-06 ENCOUNTER — Ambulatory Visit: Payer: Managed Care, Other (non HMO) | Attending: Family Medicine | Admitting: Physical Therapy

## 2014-05-06 DIAGNOSIS — M25561 Pain in right knee: Secondary | ICD-10-CM | POA: Diagnosis not present

## 2014-05-06 DIAGNOSIS — Y9355 Activity, bike riding: Secondary | ICD-10-CM | POA: Diagnosis not present

## 2014-05-06 DIAGNOSIS — M25562 Pain in left knee: Secondary | ICD-10-CM | POA: Insufficient documentation

## 2014-05-06 DIAGNOSIS — S8992XD Unspecified injury of left lower leg, subsequent encounter: Secondary | ICD-10-CM | POA: Diagnosis not present

## 2014-05-06 DIAGNOSIS — X58XXXD Exposure to other specified factors, subsequent encounter: Secondary | ICD-10-CM | POA: Diagnosis not present

## 2014-05-06 MED ORDER — VALSARTAN 320 MG PO TABS: 320.0000 mg | ORAL_TABLET | Freq: Every day | ORAL | Status: DC

## 2014-05-06 MED ORDER — DILTIAZEM HCL ER COATED BEADS 240 MG PO CP24: 240.0000 mg | ORAL_CAPSULE | Freq: Every day | ORAL | Status: DC

## 2014-05-06 MED ORDER — ATENOLOL 50 MG PO TABS: 50.0000 mg | ORAL_TABLET | Freq: Every day | ORAL | Status: DC

## 2014-05-06 NOTE — Addendum Note (Signed)
Addended by: Sharon Seller B on: 05/06/2014 11:16 AM   Modules accepted: Orders

## 2014-05-11 ENCOUNTER — Ambulatory Visit: Payer: Managed Care, Other (non HMO) | Admitting: Physical Therapy

## 2014-05-11 DIAGNOSIS — M25561 Pain in right knee: Secondary | ICD-10-CM | POA: Diagnosis not present

## 2014-05-14 ENCOUNTER — Ambulatory Visit: Payer: Managed Care, Other (non HMO) | Admitting: Physical Therapy

## 2014-05-14 DIAGNOSIS — M25561 Pain in right knee: Secondary | ICD-10-CM | POA: Diagnosis not present

## 2014-05-18 ENCOUNTER — Other Ambulatory Visit (INDEPENDENT_AMBULATORY_CARE_PROVIDER_SITE_OTHER): Payer: Managed Care, Other (non HMO)

## 2014-05-18 LAB — BASIC METABOLIC PANEL
BUN: 18 mg/dL (ref 6–23)
CALCIUM: 10.7 mg/dL — AB (ref 8.4–10.5)
CO2: 22 mEq/L (ref 19–32)
Chloride: 101 mEq/L (ref 96–112)
Creatinine, Ser: 1.8 mg/dL — ABNORMAL HIGH (ref 0.4–1.2)
GFR: 30.95 mL/min — ABNORMAL LOW (ref >60.00)
Glucose, Bld: 156 mg/dL — ABNORMAL HIGH (ref 70–99)
POTASSIUM: 3.8 meq/L (ref 3.5–5.1)
SODIUM: 136 meq/L (ref 135–145)

## 2014-05-19 ENCOUNTER — Ambulatory Visit: Payer: Managed Care, Other (non HMO) | Attending: Family Medicine | Admitting: Physical Therapy

## 2014-05-19 DIAGNOSIS — M25561 Pain in right knee: Secondary | ICD-10-CM | POA: Insufficient documentation

## 2014-05-19 DIAGNOSIS — M25562 Pain in left knee: Secondary | ICD-10-CM | POA: Insufficient documentation

## 2014-05-25 ENCOUNTER — Ambulatory Visit: Payer: Managed Care, Other (non HMO) | Admitting: Physical Therapy

## 2014-05-25 DIAGNOSIS — M25562 Pain in left knee: Secondary | ICD-10-CM | POA: Diagnosis not present

## 2014-05-25 DIAGNOSIS — M25561 Pain in right knee: Secondary | ICD-10-CM | POA: Diagnosis not present

## 2014-05-28 ENCOUNTER — Ambulatory Visit: Payer: Managed Care, Other (non HMO) | Admitting: Physical Therapy

## 2014-05-28 DIAGNOSIS — M25561 Pain in right knee: Secondary | ICD-10-CM | POA: Diagnosis not present

## 2014-05-31 ENCOUNTER — Ambulatory Visit: Payer: Managed Care, Other (non HMO) | Admitting: Physical Therapy

## 2014-05-31 DIAGNOSIS — M25561 Pain in right knee: Secondary | ICD-10-CM | POA: Diagnosis not present

## 2014-06-02 ENCOUNTER — Ambulatory Visit: Payer: Managed Care, Other (non HMO) | Admitting: Physical Therapy

## 2014-06-02 DIAGNOSIS — M25561 Pain in right knee: Secondary | ICD-10-CM | POA: Diagnosis not present

## 2014-06-14 ENCOUNTER — Ambulatory Visit: Payer: Managed Care, Other (non HMO) | Admitting: Physical Therapy

## 2014-06-14 DIAGNOSIS — M25561 Pain in right knee: Secondary | ICD-10-CM | POA: Diagnosis not present

## 2014-06-18 ENCOUNTER — Ambulatory Visit: Payer: Managed Care, Other (non HMO) | Attending: Family Medicine | Admitting: Physical Therapy

## 2014-06-18 DIAGNOSIS — M25561 Pain in right knee: Secondary | ICD-10-CM | POA: Diagnosis not present

## 2014-06-18 DIAGNOSIS — M25562 Pain in left knee: Secondary | ICD-10-CM | POA: Insufficient documentation

## 2014-06-21 ENCOUNTER — Ambulatory Visit: Payer: Managed Care, Other (non HMO) | Admitting: Physical Therapy

## 2014-06-24 ENCOUNTER — Ambulatory Visit: Payer: Managed Care, Other (non HMO) | Admitting: Physical Therapy

## 2014-06-28 ENCOUNTER — Ambulatory Visit: Payer: Managed Care, Other (non HMO) | Admitting: Physical Therapy

## 2014-06-28 DIAGNOSIS — M25561 Pain in right knee: Secondary | ICD-10-CM | POA: Diagnosis not present

## 2014-06-30 ENCOUNTER — Ambulatory Visit: Payer: Managed Care, Other (non HMO) | Admitting: Physical Therapy

## 2014-06-30 DIAGNOSIS — M25561 Pain in right knee: Secondary | ICD-10-CM | POA: Diagnosis not present

## 2014-07-06 ENCOUNTER — Ambulatory Visit (INDEPENDENT_AMBULATORY_CARE_PROVIDER_SITE_OTHER): Payer: Managed Care, Other (non HMO) | Admitting: Internal Medicine

## 2014-07-06 ENCOUNTER — Encounter: Payer: Self-pay | Admitting: Internal Medicine

## 2014-07-06 VITALS — BP 128/80 | HR 83 | Temp 97.8°F | Ht 66.0 in | Wt 240.4 lb

## 2014-07-06 DIAGNOSIS — G43809 Other migraine, not intractable, without status migrainosus: Secondary | ICD-10-CM

## 2014-07-06 DIAGNOSIS — I10 Essential (primary) hypertension: Secondary | ICD-10-CM

## 2014-07-06 DIAGNOSIS — R7302 Impaired glucose tolerance (oral): Secondary | ICD-10-CM

## 2014-07-06 DIAGNOSIS — Z0000 Encounter for general adult medical examination without abnormal findings: Secondary | ICD-10-CM

## 2014-07-06 DIAGNOSIS — F411 Generalized anxiety disorder: Secondary | ICD-10-CM

## 2014-07-06 DIAGNOSIS — Z0189 Encounter for other specified special examinations: Secondary | ICD-10-CM

## 2014-07-06 MED ORDER — TORSEMIDE 20 MG PO TABS: 20.0000 mg | ORAL_TABLET | Freq: Every day | ORAL | Status: DC

## 2014-07-06 NOTE — Progress Notes (Signed)
Pre visit review using our clinic review tool, if applicable. No additional management support is needed unless otherwise documented below in the visit note. 

## 2014-07-06 NOTE — Assessment & Plan Note (Signed)
stable overall by history and exam, recent data reviewed with pt, and pt to continue medical treatment as before,  to f/u any worsening symptoms or concerns Lab Results  Component Value Date   WBC 11.4* 09/22/2013   HGB 12.4 09/22/2013   HCT 37.5 09/22/2013   PLT 276.0 09/22/2013   GLUCOSE 156* 05/18/2014   CHOL 205* 03/25/2014   TRIG 280.0* 03/25/2014   HDL 36.70* 03/25/2014   LDLDIRECT 98.0 03/25/2014   LDLCALC 133* 09/22/2013   ALT 23 03/25/2014   AST 25 03/25/2014   NA 136 05/18/2014   K 3.8 05/18/2014   CL 101 05/18/2014   CREATININE 1.8* 05/18/2014   BUN 18 05/18/2014   CO2 22 05/18/2014   TSH 2.53 09/22/2013   INR 0.9 09/04/2007   HGBA1C 6.2 03/25/2014   MICROALBUR 3.8* 09/22/2013

## 2014-07-06 NOTE — Assessment & Plan Note (Signed)
stable overall by history and exam, recent data reviewed with pt, and pt to continue medical treatment as before,  to f/u any worsening symptoms or concerns BP Readings from Last 3 Encounters:  07/06/14 128/80  05/04/14 140/82  04/27/14 120/72

## 2014-07-06 NOTE — Progress Notes (Signed)
Subjective:    Patient ID: Tara [name], female    DOB: [birth year], 63 y.o.   MRN: [id number]  HPI  Here to f/u; overall doing ok,  Pt denies chest pain, increased sob or doe, wheezing, orthopnea, PND, increased LE swelling, palpitations, dizziness or syncope.  Pt denies polydipsia, polyuria, or low sugar symptoms such as weakness or confusion improved with po intake.  Pt denies new neurological symptoms such as new headache, or facial or extremity weakness or numbness.   Pt states overall good compliance with meds, has been trying to follow lower cholesterol diet, with wt overall stable,  but little exercise however. Cont's to see mental health for anx/depression, also renal - now torsemide.  Does mention migraine type HA starting last night with persistent nausea today.  Gettting PT for right knee pain, plans to see Dr Smith/sport [facility] next wk.  Still working as a Designer, jewellery Past Medical History  Diagnosis Date  . ANKLE PAIN, LEFT 04/01/2008  . ANXIETY 04/17/2007  . COLONIC POLYPS, HX OF 08/01/2007  . CONTUSIONS, MULTIPLE 04/01/2009  . DEPRESSION 04/17/2007  . DIZZINESS 08/01/2007  . DYSPNEA 08/01/2007  . Enlargement of lymph nodes 08/13/2007  . GLUCOSE INTOLERANCE 08/01/2007  . HYPERLIPIDEMIA 08/01/2007  . HYPERSOMNIA 07/28/2008  . HYPERTENSION 04/17/2007  . JOINT EFFUSION, LEFT KNEE 06/02/2010  . Loose body in knee 04/01/2009  . Morbid obesity 04/20/2007  . OTHER DISEASES OF LUNG NOT ELSEWHERE CLASSIFIED 08/01/2007  . Pain in joint, lower leg 04/01/2009  . PERIPHERAL EDEMA 04/21/2009  . Sarcoidosis 09/25/2007  . SHOULDER PAIN, LEFT 04/01/2009  . Impaired glucose tolerance 03/23/2011  . Oligomenorrhea   . Migraines     "stopped 3-4 yr ago" (07/23/2012)  . 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 Chronic kidney disease    Past Surgical History  Procedure Laterality Date  . Myomectomy  1994  . Fracture surgery  ?02/1997    "left upper arm; put rod in" (07/23/2012)  . Refractive surgery  08/1998     "both eyes" (07/23/2012)  . Combined mediastinoscopy and bronchoscopy  08/2007  . Gum surgery  2000-?2009    "several ORs; soft tissue graft; took material from roof of mouth" (07/23/2012  . Knee arthroscopy  03/2004; 06/2009    "right; left" Dr. Theda Sers  . Lymph node biopsy  ~ 2009    "for sarcoidosis; don't know exactly which nodes" (07/23/2102)  . Combined mediastinoscopy and bronchoscopy  2009    reports that she has never smoked. She has never used smokeless tobacco. She reports that she drinks alcohol. She reports that she does not use illicit drugs. family history includes Asthma in her sister; Diabetes in her mother; Heart disease (age of onset: 38) in her father; Hypertension in her father, mother, and sister; Stroke in her mother. There is no history of Colon cancer. Allergies  Allergen Reactions  . Floxin [Ocuflox]     shaky  . Fluoxetine Hcl     Suicidal thoughts  . Sulfonamide Derivatives Rash   Current Outpatient Prescriptions on File Prior to Visit  Medication Sig Dispense Refill  . amphetamine-dextroamphetamine (ADDERALL) 30 MG tablet Take 30 mg by mouth 2 (two) times daily.    Marland Kitchen aspirin 81 MG tablet Take 81 mg by mouth daily.      Marland Kitchen atenolol (TENORMIN) 50 MG tablet Take 1 tablet (50 mg total) by mouth daily. 90 tablet 3  . clorazepate (TRANXENE) 7.5 MG tablet Take 7.5 mg by mouth QID.     Marland Kitchen  desvenlafaxine (PRISTIQ) 50 MG 24 hr tablet Take 50 mg by mouth daily.    Marland Kitchen diltiazem (CARTIA XT) 240 MG 24 hr capsule Take 1 capsule (240 mg total) by mouth daily. 90 capsule 3  . ferrous sulfate 325 (65 FE) MG tablet Take 325 mg by mouth daily with breakfast.    . glucosamine-chondroitin 500-400 MG tablet Take 1 tablet by mouth 3 (three) times daily.    . Multiple Vitamin (MULTIVITAMIN) tablet Take 1 tablet by mouth daily.    . valsartan (DIOVAN) 320 MG tablet Take 1 tablet (320 mg total) by mouth daily. 90 tablet 3  . zaleplon (SONATA) 10 MG capsule Take 10 mg by mouth at bedtime.      No current facility-administered medications on file prior to visit.   Review of Systems  Constitutional: Negative for unusual diaphoresis or other sweats  HENT: Negative for ringing in ear Eyes: Negative for double vision or worsening visual disturbance.  Respiratory: Negative for choking and stridor.   Gastrointestinal: Negative for vomiting or other signifcant bowel change Genitourinary: Negative for hematuria or decreased urine volume.  Musculoskeletal: Negative for other MSK pain or swelling Skin: Negative for color change and worsening wound.  Neurological: Negative for tremors and numbness other than noted  Psychiatric/Behavioral: Negative for decreased concentration or agitation other than above       Objective:   Physical Exam BP 128/80 mmHg  Pulse 83  Temp(Src) 97.8 F (36.6 C) (Oral)  Ht 5\' 6"  (1.676 m)  Wt 240 lb 6 oz (109.033 kg)  BMI 38.82 kg/m2  SpO2 96% VS noted,  Constitutional: Pt appears well-developed, well-nourished.  HENT: Head: NCAT.  Right Ear: External ear normal.  Left Ear: External ear normal.  Eyes: . Pupils are equal, round, and reactive to light. Conjunctivae and EOM are normal Neck: Normal range of motion. Neck supple.  Cardiovascular: Normal rate and regular rhythm.   Pulmonary/Chest: Effort normal and breath sounds normal.  Abd:  Soft, NT, ND, + BS Neurological: Pt is alert. Not confused , motor grossly intact, cn 2-12 intact Skin: Skin is warm. No rash Psychiatric: Pt behavior is normal. No agitation. mild nervous    Assessment & Plan:

## 2014-07-06 NOTE — Patient Instructions (Signed)
Please continue all other medications as before, and refills have been done if requested.  Please have the pharmacy call with any other refills you may need.  Please continue your efforts at being more active, low cholesterol diet, and weight control..  Please keep your appointments with your specialists as you may have planned  Please return in 6 months, or sooner if needed, with Lab testing done 3-5 days before  

## 2014-07-06 NOTE — Assessment & Plan Note (Signed)
Mild to mod, for otc med use, delicnes other tx at this time,  to f/u any worsening symptoms or concerns

## 2014-07-07 ENCOUNTER — Telehealth: Payer: Self-pay | Admitting: Internal Medicine

## 2014-07-07 NOTE — Telephone Encounter (Signed)
emmi emailed °

## 2014-07-14 ENCOUNTER — Ambulatory Visit (INDEPENDENT_AMBULATORY_CARE_PROVIDER_SITE_OTHER): Payer: Managed Care, Other (non HMO) | Admitting: Internal Medicine

## 2014-07-14 ENCOUNTER — Other Ambulatory Visit: Payer: Managed Care, Other (non HMO)

## 2014-07-14 ENCOUNTER — Encounter: Payer: Self-pay | Admitting: Internal Medicine

## 2014-07-14 VITALS — BP 138/84 | HR 69 | Ht 66.0 in | Wt 246.0 lb

## 2014-07-14 DIAGNOSIS — D869 Sarcoidosis, unspecified: Secondary | ICD-10-CM

## 2014-07-14 NOTE — Progress Notes (Signed)
08/22/12- 61 yoF never smoker followed for sarcoid complicated by hypercalcemia, renal insufficency  [facility]-also would like to know about Iron supplement. Dr [name] PCP, Dr [facility]. Last seen here 10/04/08- had been dx'd w/ OSA but disliked CPAP and f/u NPSG 06/25/08 was negative so that issue considered resolved Sarcoid dx'd 2009 - mediastinoscopy and bronchoscopy. Prednisone x 1 year. Now hosp 1/7-1/10/14 for hypercalcemia and renal insufficiency associated with multiple pulmonary nodules of stage II sarcoid. Breast biopsy positive for noncaseating granulomas. Started on prednisone 60 mg daily at that time, anticipating one month to reduce by 5-10 mg increments. She notes that eyes burn and visual acuity is not as sharp, appetite increased on steroids. No night sweats or fever and little cough. ACE 07/23/12  86 (8-52) CT chest 07/23/12 IMPRESSION:  1. While the previously noted mediastinal and bilateral hilar  lymphadenopathy appears to have decreased, there are now  innumerable perilymphatic nodules scattered throughout the lungs  bilaterally, with a definite upper lung predominance. Overall, the  findings are compatible with sarcoidosis (i.e., progression from  stage I to stage II).  Original Report Authenticated By: [name], M.D.  09/25/12- 25 yoF never smoker followed for sarcoid complicated by hypercalcemia, renal insufficency   Dr [name] PCP, Dr Ninetta Lights. FOLLOWS FOR: increased SOB but no wheezing; "just feel winded". She remains on prednisone, started at 60 mg per day on January 10 while in hospital. Now tapering by 10 mg per week and is at 30 mg daily. ACE 08/22/12- down from 86 2 months ago to 26. She says her persistent blinking is because of dry eyes despite use of lubricant from her eye doctor.  11/03/12- 61 yoF never smoker followed for sarcoid complicated by hypercalcemia, renal insufficency   Dr [name] PCP, Dr Ninetta Lights. FOLLOWS FOR: Breathing is unchanged. Reports SOB  with exertion, chest pain and coughing from time to time. Denies chest tightness or wheezing. She did a multiple sclerosis fundraiser walk, stopping on hills. This is an unusual amount of exercise for her. Denies seasonal rhinitis complaints. Continues prednisone 20 mg daily for sarcoid. ACE 08/22/12- 26  CXR 09/25/12 Comparison: Chest x-ray of 04/21/2009 and CT of 07/23/2012.  Findings: The heart is borderline. The hila and mediastinum appear  normal. Right paratracheal thickening on the preceding chest x-ray  is less obvious. The lungs appear clear. Changes of sarcoid seen  on the recent CT examination are not clearly evident on the  radiographs because of the small size of the perilymphatic nodules  noted. No airspace disease. No effusions or pneumothoraces. No  acute bony changes.  IMPRESSION:  No acute findings in the chest.  Original Report Authenticated By: Vallery Ridge, M.D.  12/19/12- 30 yoF never smoker followed for sarcoid complicated by hypercalcemia, renal insufficency   Dr [name] PCP, Dr Ninetta Lights.ROS-see HPI FOLLOWS FOR: still notices SOB with activity Occasionally sore upper sternum, dyspnea with exertion but denies cough more swollen glands. Has been on prednisone 10 mg daily since last visit. ACE 11/03/12- 30 (8-52)   Glucose elevated on steroids CXR 09/25/12 IMPRESSION:  No acute findings in the chest.  Original Report Authenticated By: Vallery Ridge, M.D.  02/06/13- 62 yoF never smoker followed for sarcoid complicated by hypercalcemia, renal insufficency   Dr [name] PCP, Dr Ninetta Lights. FOLLOWS FOR: Pt reports breathing is about the same-- denies any other concerns at this time  has been on 1/2x20 mg prednisone every other day x3 months. Feels well. Describes one day of  transient nonexertional left anterior chest pain associated with migraine. Still, nonradiating, left of sternum. Chronic left knee pain limits exercise. Pending injection to treat.  05/15/13- 41 yoF  never smoker followed for sarcoid complicated by hypercalcemia, renal insufficency   Dr [name] PCP, Dr Ninetta Lights. FOLLOWS FOR: patient states she continues to have SOB with activity; also states she has recently had knee pain/issues/ osteoarthritis. Occasional dry cough. Knee pain limits exertion more than her dyspnea does. Has continued prednisone 10 mg every other day for sarcoid and dyspnea. CXR 02/06/13 Findings: The heart size and pulmonary vascularity are normal and  the lungs are clear. No osseous abnormality. No effusions.  IMPRESSION:  No acute disease.  Original Report Authenticated By: Lorriane Shire, M.D.  07/14/13- 62 yoF never smoker followed for sarcoid complicated by hypercalcemia, renal insufficency   Dr [name] PCP, Dr Ninetta Lights.  Husband died of kidney disease. FOLLOWS FOR: has slight wheezing at times; needs to discuss Prednisone Rx-what to take.  hospital in November with anemia and urinary infection. Was on prednisone 10 mg daily until hospital. Nephrology told her to resume prednisone 10 mg every other day. She was given Lasix 20 mg twice daily. We reviewed her ACE levels recently back up to 86, reflecting interval off of prednisone. We discussed long-term steroids and she will follow directions from nephrology about her calcium management. CXR 05/30/13 IMPRESSION:  Mild chronic lung changes noted, with underlying vascular  congestion. No definite superimposed focal airspace consolidation  seen.  Electronically Signed  By: Garald Balding M.D.  On: 05/30/2013 23:10  01/12/14- 52 yoF never smoker followed for sarcoid complicated by hypercalcemia, renal insufficency   Dr [name] PCP, Dr Ninetta Lights.  Husband died of kidney disease. FOLLOWS FOR: Pt states has occasional chest congestion at times. SOB if carrying alot of weight Tells me she is stable, but can get overheated. Nephrology/Dr Posey Pronto reducing prednisone (given for sarcoid) 10 mg->5 mg daily, to stop in August, for  kidneys. Mentions knees stiff/sore cw osteo, not hot acute arthritis  07/14/14- 63 yoF never smoker followed for sarcoid complicated by hypercalcemia, renal insufficency  FOLLOWS FOR: pt c/o occasional nonprod cough, sob with exertion.  Pt states this is stable.  She doesn't recognize any real change in exertional dyspnea. Denies fever, sweat, swollen glands, cough. ACE was 69 CXR 01/12/14 IMPRESSION: No active cardiopulmonary disease. Electronically Signed  By: Ivar Drape M.D.  On: 01/12/2014 13:35  ROS- see HPI Constitutional:   No-   weight loss, night sweats, fevers, chills, no-fatigue, lassitude. HEENT:   No-  headaches, difficulty swallowing, tooth/dental problems, sore throat,       No-  sneezing, itching, ear ache, nasal congestion, post nasal drip,  CV:  No- chest pain, no-orthopnea, PND, swelling in lower extremities, anasarca,dizziness, palpitations  Resp: + shortness of breath with exertion or at rest.              No-   productive cough,  No non-productive cough,  No- coughing up of blood.              No- wheezing.   Skin: No-   rash or lesions. GI:  No-   heartburn, indigestion, abdominal pain, nausea, vomiting,  GU:  MS:  +  joint pain or swelling.   Neuro-     nothing unusual Psych:  No- change in mood or affect. No depression or anxiety.  No memory loss.  OBJ- Physical Exam General- Alert, Oriented, Affect-appropriate, Distress- none acute, overweight  Skin- rash-none, lesions- none, excoriation- none, cosmetics smeared on face Lymphadenopathy- none Head- atraumatic            Eyes- Gross vision intact, PERRLA, conjunctivae clear, frequent blinking            Ears- Hearing, canals-normal            Nose- Clear, no-Septal dev, mucus, polyps, erosion, perforation             Throat- Mallampati II , mucosa clear , drainage- none, tonsils- atrophic Neck- flexible , trachea midline, no stridor , thyroid nl, carotid no bruit Chest - symmetrical excursion ,  unlabored           Heart/CV- RRR , no murmur , no gallop  , no rub, nl s1 s2                           - JVD- none , edema+ trace, stasis changes- none, varices- none           Lung- clear to P&A, wheeze- none, cough- none , dullness-none, rub- none           Chest wall-  Abd-  Br/ Gen/ Rectal- Not done, not indicated Extrem- cyanosis- none, clubbing, none, atrophy- none, strength- nl Neuro- +blinking, grimacing, ? Tics or frontal lobe ?

## 2014-07-14 NOTE — Patient Instructions (Signed)
Order- lab  Angiotensin Converting Enzyme level   Dx sarcoid  Please call as needed- good luck with your office move.

## 2014-07-15 LAB — ANGIOTENSIN CONVERTING ENZYME: Angiotensin-Converting Enzyme: >100 U/L — ABNORMAL HIGH (ref 8–52)

## 2014-07-16 NOTE — Assessment & Plan Note (Signed)
No systemic symptoms suggesting relapse Plan-we can skip chest x-ray this time but update ACE level

## 2014-07-19 NOTE — Progress Notes (Signed)
Quick Note:  Patient notified. Nothing further needed. ______ 

## 2014-09-21 ENCOUNTER — Other Ambulatory Visit: Payer: Self-pay

## 2014-09-21 ENCOUNTER — Telehealth: Payer: Self-pay | Admitting: Internal Medicine

## 2014-09-21 MED ORDER — VALSARTAN 320 MG PO TABS: 320.0000 mg | ORAL_TABLET | Freq: Every day | ORAL | Status: DC

## 2014-09-21 NOTE — Telephone Encounter (Signed)
Patient is requesting script for valsartan to be sent to [facility] at [facility] [address].   Patient would also like her mail order pharmacy in the system to be changed to [facility].

## 2014-09-21 NOTE — Telephone Encounter (Signed)
Rx done. 

## 2014-10-27 ENCOUNTER — Ambulatory Visit: Payer: Managed Care, Other (non HMO) | Admitting: Internal Medicine

## 2014-11-02 ENCOUNTER — Ambulatory Visit (INDEPENDENT_AMBULATORY_CARE_PROVIDER_SITE_OTHER)
Admission: RE | Admit: 2014-11-02 | Discharge: 2014-11-02 | Disposition: A | Discharge status: Home / Self Care | Payer: BLUE CROSS/BLUE SHIELD | Source: Ambulatory Visit | Attending: Internal Medicine | Admitting: Internal Medicine

## 2014-11-02 ENCOUNTER — Other Ambulatory Visit (INDEPENDENT_AMBULATORY_CARE_PROVIDER_SITE_OTHER): Payer: BLUE CROSS/BLUE SHIELD

## 2014-11-02 ENCOUNTER — Ambulatory Visit (INDEPENDENT_AMBULATORY_CARE_PROVIDER_SITE_OTHER): Payer: BLUE CROSS/BLUE SHIELD | Admitting: Internal Medicine

## 2014-11-02 ENCOUNTER — Encounter: Payer: Self-pay | Admitting: Internal Medicine

## 2014-11-02 VITALS — BP 120/78 | HR 70 | Temp 97.6°F | Resp 18 | Ht 66.0 in | Wt 234.1 lb

## 2014-11-02 DIAGNOSIS — Z0000 Encounter for general adult medical examination without abnormal findings: Secondary | ICD-10-CM | POA: Diagnosis not present

## 2014-11-02 DIAGNOSIS — M25571 Pain in right ankle and joints of right foot: Secondary | ICD-10-CM

## 2014-11-02 DIAGNOSIS — Z0189 Encounter for other specified special examinations: Secondary | ICD-10-CM

## 2014-11-02 DIAGNOSIS — R7989 Other specified abnormal findings of blood chemistry: Secondary | ICD-10-CM | POA: Diagnosis not present

## 2014-11-02 DIAGNOSIS — T380X5A Adverse effect of glucocorticoids and synthetic analogues, initial encounter: Secondary | ICD-10-CM

## 2014-11-02 DIAGNOSIS — E099 Drug or chemical induced diabetes mellitus without complications: Secondary | ICD-10-CM

## 2014-11-02 DIAGNOSIS — R7302 Impaired glucose tolerance (oral): Secondary | ICD-10-CM | POA: Diagnosis not present

## 2014-11-02 LAB — CBC WITH DIFFERENTIAL/PLATELET
BASOS ABS: 0 10*3/uL (ref 0.0–0.1)
Basophils Relative: 0.3 % (ref 0.0–3.0)
Eosinophils Absolute: 0 10*3/uL (ref 0.0–0.7)
Eosinophils Relative: 0 % (ref 0.0–5.0)
HCT: 33.1 % — ABNORMAL LOW (ref 36.0–46.0)
Hemoglobin: 11.2 g/dL — ABNORMAL LOW (ref 12.0–15.0)
LYMPHS PCT: 26.3 % (ref 12.0–46.0)
Lymphs Abs: 1.5 10*3/uL (ref 0.7–4.0)
MCHC: 34 g/dL (ref 30.0–36.0)
MCV: 81.3 fl (ref 78.0–100.0)
MONOS PCT: 14.1 % — AB (ref 3.0–12.0)
Monocytes Absolute: 0.8 10*3/uL (ref 0.1–1.0)
Neutro Abs: 3.3 10*3/uL (ref 1.4–7.7)
Neutrophils Relative %: 59.3 % (ref 43.0–77.0)
PLATELETS: 241 10*3/uL (ref 150.0–400.0)
RBC: 4.07 Mil/uL (ref 3.87–5.11)
RDW: 15.9 % — ABNORMAL HIGH (ref 11.5–15.5)
WBC: 5.5 10*3/uL (ref 4.0–10.5)

## 2014-11-02 LAB — LIPID PANEL
Cholesterol: 221 mg/dL — ABNORMAL HIGH (ref 0–200)
HDL: 40.7 mg/dL (ref >39.00)
NONHDL: 180.3
Total CHOL/HDL Ratio: 5
Triglycerides: 374 mg/dL — ABNORMAL HIGH (ref 0.0–149.0)
VLDL: 74.8 mg/dL — ABNORMAL HIGH (ref 0.0–40.0)

## 2014-11-02 LAB — HEPATIC FUNCTION PANEL
ALBUMIN: 4.2 g/dL (ref 3.5–5.2)
ALT: 19 U/L (ref 0–35)
AST: 20 U/L (ref 0–37)
Alkaline Phosphatase: 147 U/L — ABNORMAL HIGH (ref 39–117)
BILIRUBIN TOTAL: 0.4 mg/dL (ref 0.2–1.2)
Bilirubin, Direct: 0.1 mg/dL (ref 0.0–0.3)
Total Protein: 7.5 g/dL (ref 6.0–8.3)

## 2014-11-02 LAB — BASIC METABOLIC PANEL
BUN: 27 mg/dL — ABNORMAL HIGH (ref 6–23)
CALCIUM: 10.8 mg/dL — AB (ref 8.4–10.5)
CO2: 30 meq/L (ref 19–32)
CREATININE: 1.96 mg/dL — AB (ref 0.40–1.20)
Chloride: 100 mEq/L (ref 96–112)
GFR: 27.47 mL/min — ABNORMAL LOW (ref >60.00)
GLUCOSE: 105 mg/dL — AB (ref 70–99)
Potassium: 4.2 mEq/L (ref 3.5–5.1)
Sodium: 136 mEq/L (ref 135–145)

## 2014-11-02 LAB — URINALYSIS, ROUTINE W REFLEX MICROSCOPIC
BILIRUBIN URINE: NEGATIVE
Hgb urine dipstick: NEGATIVE
Ketones, ur: NEGATIVE
Nitrite: NEGATIVE
PH: 6.5 (ref 5.0–8.0)
SPECIFIC GRAVITY, URINE: 1.01 (ref 1.000–1.030)
TOTAL PROTEIN, URINE-UPE24: NEGATIVE
Urine Glucose: NEGATIVE
Urobilinogen, UA: 0.2 (ref 0.0–1.0)

## 2014-11-02 LAB — TSH: TSH: 2.51 u[IU]/mL (ref 0.35–4.50)

## 2014-11-02 LAB — HEMOGLOBIN A1C: HEMOGLOBIN A1C: 6 % (ref 4.6–6.5)

## 2014-11-02 LAB — LDL CHOLESTEROL, DIRECT: Direct LDL: 75 mg/dL

## 2014-11-02 NOTE — Assessment & Plan Note (Signed)

## 2014-11-02 NOTE — Progress Notes (Signed)
Subjective:    Patient ID: Tara [name], female    DOB: 1951/07/01, 64 y.o.   MRN: [id number]  HPI Here for wellness and f/u;  Overall doing ok;  Pt denies Chest pain, worsening SOB, DOE, wheezing, orthopnea, PND, worsening LE edema, palpitations, dizziness or syncope.  Pt denies neurological change such as new headache, facial or extremity weakness.  Pt denies polydipsia, polyuria, or low sugar symptoms. Pt states overall good compliance with treatment and medications, good tolerability, and has been trying to follow appropriate diet.  Pt denies worsening depressive symptoms, suicidal ideation or panic. No fever, night sweats, wt loss, loss of appetite, or other constitutional symptoms.  Pt states good ability with ADL's, has low fall risk, home safety reviewed and adequate, no other significant changes in hearing or vision, and only occasionally active with exercise.  C/o right anlke pain with sweling for approx 2 mo, limps slightly to walk, not as severe as ongoing left knee pain however, has seen Dr Tamala Julian for this Past Medical History  Diagnosis Date  . ANKLE PAIN, LEFT 04/01/2008  . ANXIETY 04/17/2007  . COLONIC POLYPS, HX OF 08/01/2007  . CONTUSIONS, MULTIPLE 04/01/2009  . DEPRESSION 04/17/2007  . DIZZINESS 08/01/2007  . DYSPNEA 08/01/2007  . Enlargement of lymph nodes 08/13/2007  . GLUCOSE INTOLERANCE 08/01/2007  . HYPERLIPIDEMIA 08/01/2007  . HYPERSOMNIA 07/28/2008  . HYPERTENSION 04/17/2007  . JOINT EFFUSION, LEFT KNEE 06/02/2010  . Loose body in knee 04/01/2009  . Morbid obesity 04/20/2007  . OTHER DISEASES OF LUNG NOT ELSEWHERE CLASSIFIED 08/01/2007  . Pain in joint, lower leg 04/01/2009  . PERIPHERAL EDEMA 04/21/2009  . Sarcoidosis 09/25/2007  . SHOULDER PAIN, LEFT 04/01/2009  . Impaired glucose tolerance 03/23/2011  . Oligomenorrhea   . Migraines     "stopped 3-4 yr ago" (07/23/2012)  . 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 Chronic kidney disease    Past Surgical History  Procedure  Laterality Date  . Myomectomy  1994  . Fracture surgery  ?02/1997    "left upper arm; put rod in" (07/23/2012)  . Refractive surgery  08/1998    "both eyes" (07/23/2012)  . Combined mediastinoscopy and bronchoscopy  08/2007  . Gum surgery  2000-?2009    "several ORs; soft tissue graft; took material from roof of mouth" (07/23/2012  . Knee arthroscopy  03/2004; 06/2009    "right; left" Dr. Theda Sers  . Lymph node biopsy  ~ 2009    "for sarcoidosis; don't know exactly which nodes" (07/23/2102)  . Combined mediastinoscopy and bronchoscopy  2009    reports that she has never smoked. She has never used smokeless tobacco. She reports that she drinks alcohol. She reports that she does not use illicit drugs. family history includes Asthma in her sister; Diabetes in her mother; Heart disease (age of onset: 10) in her father; Hypertension in her father, mother, and sister; Stroke in her mother. There is no history of Colon cancer. Allergies  Allergen Reactions  . Floxin [Ocuflox]     shaky  . Fluoxetine Hcl     Suicidal thoughts  . Sulfonamide Derivatives Rash   Current Outpatient Prescriptions on File Prior to Visit  Medication Sig Dispense Refill  . amphetamine-dextroamphetamine (ADDERALL) 30 MG tablet Take 30 mg by mouth 2 (two) times daily.    Marland Kitchen aspirin 81 MG tablet Take 81 mg by mouth daily.      Marland Kitchen atenolol (TENORMIN) 50 MG tablet Take 1 tablet (50 mg total) by mouth daily. Suarez  tablet 3  . clorazepate (TRANXENE) 7.5 MG tablet Take 7.5 mg by mouth QID.     Marland Kitchen desvenlafaxine (PRISTIQ) 50 MG 24 hr tablet Take 50 mg by mouth daily.    Marland Kitchen diltiazem (CARTIA XT) 240 MG 24 hr capsule Take 1 capsule (240 mg total) by mouth daily. 90 capsule 3  . ferrous sulfate 325 (65 FE) MG tablet Take 325 mg by mouth daily with breakfast.    . glucosamine-chondroitin 500-400 MG tablet Take 1 tablet by mouth 3 (three) times daily.    . Multiple Vitamin (MULTIVITAMIN) tablet Take 1 tablet by mouth daily.    Marland Kitchen torsemide  (DEMADEX) 20 MG tablet Take 1 tablet (20 mg total) by mouth daily. 90 tablet 3  . valsartan (DIOVAN) 320 MG tablet Take 1 tablet (320 mg total) by mouth daily. 90 tablet 3  . zaleplon (SONATA) 10 MG capsule Take 10 mg by mouth at bedtime.     No current facility-administered medications on file prior to visit.   Review of Systems Constitutional: Negative for increased diaphoresis, other activity, appetite or siginficant weight change other than noted HENT: Negative for worsening hearing loss, ear pain, facial swelling, mouth sores and neck stiffness.   Eyes: Negative for other worsening pain, redness or visual disturbance.  Respiratory: Negative for shortness of breath and wheezing  Cardiovascular: Negative for chest pain and palpitations.  Gastrointestinal: Negative for diarrhea, blood in stool, abdominal distention or other pain Genitourinary: Negative for hematuria, flank pain or change in urine volume.  Musculoskeletal: Negative for myalgias or other joint complaints.  Skin: Negative for color change and wound or drainage.  Neurological: Negative for syncope and numbness. other than noted Hematological: Negative for adenopathy. or other swelling Psychiatric/Behavioral: Negative for hallucinations, SI, self-injury, decreased concentration or other worsening agitation.      Objective:   Physical Exam BP 120/78 mmHg  Pulse 70  Temp(Src) 97.6 F (36.4 C) (Oral)  Resp 18  Ht 5\' 6"  (1.676 m)  Wt 234 lb 1.3 oz (106.178 kg)  BMI 37.80 kg/m2  SpO2 96% BP Readings from Last 3 Encounters:  11/02/14 120/78  07/14/14 138/84  07/06/14 128/80   Wt Readings from Last 3 Encounters:  11/02/14 234 lb 1.3 oz (106.178 kg)  07/14/14 246 lb (111.585 kg)  07/06/14 240 lb 6 oz (109.033 kg)   VS noted,  Constitutional: Pt is oriented to person, place, and time. Appears well-developed and well-nourished, in no significant distress Head: Normocephalic and atraumatic.  Right Ear: External ear  normal.  Left Ear: External ear normal.  Nose: Nose normal.  Mouth/Throat: Oropharynx is clear and moist.  Eyes: Conjunctivae and EOM are normal. Pupils are equal, round, and reactive to light.  Neck: Normal range of motion. Neck supple. No JVD present. No tracheal deviation present or significant neck LA or mass Cardiovascular: Normal rate, regular rhythm, normal heart sounds and intact distal pulses.   Pulmonary/Chest: Effort normal and breath sounds without rales or wheezing  Abdominal: Soft. Bowel sounds are normal. NT. No HSM  Musculoskeletal: Normal range of motion. Exhibits no edema.  Lymphadenopathy:  Has no cervical adenopathy.  Neurological: Pt is alert and oriented to person, place, and time. Pt has normal reflexes. No cranial nerve deficit. Motor grossly intact Skin: Skin is warm and dry. No rash noted.  Psychiatric:  Has normal mood and affect. Behavior is normal.  Right ankle with trace to 1+ effusion, mild tender, non warm, FROM but limps to walk  Lab Results  Component Value Date   WBC 11.4* 09/22/2013   HGB 12.4 09/22/2013   HCT 37.5 09/22/2013   PLT 276.0 09/22/2013   GLUCOSE 156* 05/18/2014   CHOL 205* 03/25/2014   TRIG 280.0* 03/25/2014   HDL 36.70* 03/25/2014   LDLDIRECT 98.0 03/25/2014   LDLCALC 133* 09/22/2013   ALT 23 03/25/2014   AST 25 03/25/2014   NA 136 05/18/2014   K 3.8 05/18/2014   CL 101 05/18/2014   CREATININE 1.8* 05/18/2014   BUN 18 05/18/2014   CO2 22 05/18/2014   TSH 2.53 09/22/2013   INR 0.9 09/04/2007   HGBA1C 6.2 03/25/2014   MICROALBUR 3.8* 09/22/2013       Assessment & Plan:

## 2014-11-02 NOTE — Assessment & Plan Note (Signed)
For f/u a1c today, cont efforts at diet , exercise, wt loss

## 2014-11-02 NOTE — Patient Instructions (Signed)
Please continue all other medications as before, and refills have been done if requested.  Please have the pharmacy call with any other refills you may need.  Please continue your efforts at being more active, low cholesterol diet, and weight control.  You are otherwise up to date with prevention measures today.  Please keep your appointments with your specialists as you may have planned  Please go to the XRAY Department in the Basement (go straight as you get off the elevator) for the x-ray testing  Please go to the LAB in the Basement (turn left off the elevator) for the tests to be done today  You will be contacted by phone if any changes need to be made immediately.  Otherwise, you will receive a letter about your results with an explanation, but please check with MyChart first.  Please remember to sign up for MyChart if you have not done so, as this will be important to you in the future with finding out test results, communicating by private email, and scheduling acute appointments online when needed.  Please return in 6 months, or sooner if needed

## 2014-11-02 NOTE — Progress Notes (Signed)
Pre visit review using our clinic review tool, if applicable. No additional management support is needed unless otherwise documented below in the visit note. 

## 2014-11-02 NOTE — Assessment & Plan Note (Signed)
?   DJD vs other, for xray, also refer to Dr Tamala Julian for this

## 2014-11-03 ENCOUNTER — Encounter: Payer: Self-pay | Admitting: Internal Medicine

## 2014-11-12 ENCOUNTER — Ambulatory Visit: Payer: Managed Care, Other (non HMO) | Admitting: Internal Medicine

## 2014-11-17 ENCOUNTER — Ambulatory Visit (INDEPENDENT_AMBULATORY_CARE_PROVIDER_SITE_OTHER): Payer: BLUE CROSS/BLUE SHIELD | Admitting: Family Medicine

## 2014-11-17 ENCOUNTER — Encounter: Payer: Self-pay | Admitting: Family Medicine

## 2014-11-17 VITALS — BP 138/84 | HR 74 | Ht 66.0 in | Wt 236.0 lb

## 2014-11-17 DIAGNOSIS — M216X9 Other acquired deformities of unspecified foot: Secondary | ICD-10-CM | POA: Insufficient documentation

## 2014-11-17 DIAGNOSIS — M1710 Unilateral primary osteoarthritis, unspecified knee: Secondary | ICD-10-CM

## 2014-11-17 DIAGNOSIS — M216X1 Other acquired deformities of right foot: Secondary | ICD-10-CM

## 2014-11-17 DIAGNOSIS — M7671 Peroneal tendinitis, right leg: Secondary | ICD-10-CM

## 2014-11-17 DIAGNOSIS — M179 Osteoarthritis of knee, unspecified: Secondary | ICD-10-CM

## 2014-11-17 NOTE — Progress Notes (Signed)
[name] [address], [place] [address] Phone: [phone] Subjective:    I'm seeing this patient by the request  of:  Cathlean Cower, MD   CC: Right ankle pain  [id number] Tara [name] is a 64 y.o. female coming in with complaint of  Right ankle pain. Patient states that this been hurting her more recently. Patient did see primary care provider 2 weeks ago and did have x-rays. X-rays were reviewed by me and patient showed some soft tissue swelling with mild osteophytic changes. Patient states the pain seems to be on the lateral aspect of the ankle. States that it can have some weakness and numbness occasionally. Denies that it is ever caused her to fall.  Patient also has left knee pain. It has been since September since we seen patient and patient did have extensive osteophytic changes of the knees bilaterally mostly of the medial compartment. Patient did have a steroid injection and states that it did help for some time. Patient was to continue to avoid any surgical intervention. Patient states that as long as she wears the brace she seems to do relatively well.    Past medical history, social, surgical and family history all reviewed in electronic medical record.   Review of Systems: No headache, visual changes, nausea, vomiting, diarrhea, constipation, dizziness, abdominal pain, skin rash, fevers, chills, night sweats, weight loss, swollen lymph nodes, body aches, joint swelling, muscle aches, chest pain, shortness of breath, mood changes.   Objective Blood pressure 138/84, pulse 74, height 5\' 6"  (1.676 m), weight 236 lb (107.049 kg), SpO2 98 %.  General: No apparent distress alert and oriented x3 mood and affect normal, dressed appropriately.  HEENT: Pupils equal, extraocular movements intact  Respiratory: Patient's speak in full sentences and does not appear short of breath  Cardiovascular: No lower extremity edema, non tender, no  erythema  Skin: Warm dry intact with no signs of infection or rash on extremities or on axial skeleton.  Abdomen: Soft nontender  Neuro: Cranial nerves II through XII are intact, neurovascularly intact in all extremities with 2+ DTRs and 2+ pulses.  Lymph: No lymphadenopathy of posterior or anterior cervical chain or axillae bilaterally.  Gait normal with good balance and coordination.  MSK:  Non tender with full range of motion and good stability and symmetric strength and tone of shoulders, elbows, wrist, hip, bilaterally.   Ankle: Left Significant bony changes of the medial and lateral malleolus Range of motion is full in all directions. Strength is 5/5 in all directions. Stable lateral and medial ligaments; squeeze test and kleiger test unremarkable; Talar dome minimally tender No pain at base of 5th MT; No tenderness over cuboid; No tenderness over N spot or navicular prominence No tenderness on posterior aspects of lateral and medial malleolus Tenderness over the peroneal tendons on the right side Negative tarsal tunnel tinel's Able to walk 4 steps.  Foot exam shows the patient does have severe over pronation of the hindfoot the patient does work walk in a supinated state. Patient also has significant splaying between the first and second toes with breakdown of the transverse arch. Mild hallux rigidus noted  Knee: The left Bilateral knees have valgus deformity at baseline Moderate tenderness to palpation mostly over the medial and lateral joint lines. ROM full in flexion and extension and lower leg rotation. Ligaments with solid consistent endpoints including ACL, PCL, LCL, MCL. Negative Mcmurray's, Apley's, and Thessalonian tests. painful patellar compression. Patellar glide  severe crepitus. Patellar and quadriceps tendons unremarkable. Hamstring and quadriceps strength is normal.    Procedure: Real-time Ultrasound Guided Injection of left knee Device: GE Logiq E    Ultrasound guided injection is preferred based studies that show increased duration, increased effect, greater accuracy, decreased procedural pain, increased response rate, and decreased cost with ultrasound guided versus blind injection.  Verbal informed consent obtained.  Time-out conducted.  Noted no overlying erythema, induration, or other signs of local infection.  Skin prepped in a sterile fashion.  Local anesthesia: Topical Ethyl chloride.  With sterile technique and under real time ultrasound guidance: With a 22-gauge 2 inch needle patient was injected with 4 cc of 0.5% Marcaine and 1 cc of Kenalog 40 mg/dL. This was from a superior lateral approach.  Completed without difficulty  Pain immediately resolved suggesting accurate placement of the medication.  Advised to call if fevers/chills, erythema, induration, drainage, or persistent bleeding.  Images permanently stored and available for review in the ultrasound unit.  Impression: Technically successful ultrasound guided injection.   Impression and Recommendations:     This case required medical decision making of moderate complexity.

## 2014-11-17 NOTE — Assessment & Plan Note (Signed)
Discuss weight loss, discussed custom orthotics and patient will be waist and knees. Gait patient trial topical anti-inflammatories and some home exercises to strengthen the lateral column. Patient will come back and see me again in 4 weeks for further evaluation and treatment. At that time if continuing have pain we will ultrasound the area.

## 2014-11-17 NOTE — Assessment & Plan Note (Signed)
Patient was given another injection today. We discussed icing regimen home exercises. We discussed over-the-counter medications the patient wants to avoid this secondary to her sarcoidosis and hypercalcemia. Patient will continue with the brace and patient was given a new back. Patient and will come back and see me again in 4 weeks. If continuing to have trouble she may be a candidate for viscous supplementation. Patient states she had this done years ago at another facility.

## 2014-11-17 NOTE — Progress Notes (Signed)
Pre visit review using our clinic review tool, if applicable. No additional management support is needed unless otherwise documented below in the visit note. 

## 2014-11-17 NOTE — Patient Instructions (Addendum)
Good to see you Tara [name] is your friend to the foot and the knee Try pennsaid twice daily as needed We will get you in custom orthotics Good shoes with rigid bottom.  Jalene Mullet, Merrell or New balance greater then 700 Injected your knee today and keep up with the exercises.  See me again in  4 weeks.

## 2014-11-29 ENCOUNTER — Encounter: Payer: Self-pay | Admitting: Family Medicine

## 2014-11-29 ENCOUNTER — Ambulatory Visit (INDEPENDENT_AMBULATORY_CARE_PROVIDER_SITE_OTHER): Payer: BLUE CROSS/BLUE SHIELD | Admitting: Family Medicine

## 2014-11-29 DIAGNOSIS — M1710 Unilateral primary osteoarthritis, unspecified knee: Secondary | ICD-10-CM

## 2014-11-29 DIAGNOSIS — M179 Osteoarthritis of knee, unspecified: Secondary | ICD-10-CM | POA: Diagnosis not present

## 2014-11-29 NOTE — Assessment & Plan Note (Signed)
Discussed wear and increasing slowly, discussed RTC in 2-4 weeks  See patient instructions.

## 2014-11-29 NOTE — Progress Notes (Signed)
Patient was fitted for a : standard, cushioned, semi-rigid orthotic. The orthotic was heated and afterward the patient was in a seated position and the orthotic molded. The patient was positioned in subtalar neutral position and 10 degrees of ankle dorsiflexion in a non-weight bearing stance. After completion of molding, patient did have orthotic management which included instructions on acclimating to the orthotics, signs of ill fit as well as care for the orthotic.  I spent 69minutes with the patient fitting the orthotics, discussing proper fit and   The blank was ground to a stable position for weight bearing. Size: 8.5 (igli Comfort)  Base: Carbon fiber Additional Posting and Padding: The following postings were fitted onto the molded orthotics to help maintain a talar neutral position - Wedge posting for transverse arch: None    Silicone posting for longitudinal arch: 250/100 x2 right   250/100 left  The patient ambulated these, and they were very comfortable and supportive.

## 2014-11-29 NOTE — Patient Instructions (Signed)
Acclimating to your Igli orthotics -  ° °We recommend that you allow up to 2 weeks for you to fully acclimate to your new custom orthotics. Please use the following recommended plan to build into full day wear.  ° °Day 1 - 2hours/day °Every day afterwards add 1 hour of wear(3hrs/day, 4hrs/day, etc) until you are able to wear them for an entire day without issues.  ° °If you notice any irritation or increasing discomfort with your new orthotics, please do not hesitate in contacting the office(leave a message for Jody or Lindsay) or sending Jody a message through MyChart to arrange a time to review your fit.  ° °Enjoy your new orthotics!!  ° °Jody McConnell ° ° ° °

## 2014-12-15 ENCOUNTER — Ambulatory Visit: Payer: BLUE CROSS/BLUE SHIELD | Admitting: Family Medicine

## 2014-12-23 ENCOUNTER — Ambulatory Visit (INDEPENDENT_AMBULATORY_CARE_PROVIDER_SITE_OTHER): Payer: BLUE CROSS/BLUE SHIELD | Admitting: Family Medicine

## 2014-12-23 ENCOUNTER — Encounter: Payer: Self-pay | Admitting: Family Medicine

## 2014-12-23 VITALS — BP 116/82 | HR 63 | Ht 66.0 in | Wt 235.0 lb

## 2014-12-23 DIAGNOSIS — M25571 Pain in right ankle and joints of right foot: Secondary | ICD-10-CM | POA: Diagnosis not present

## 2014-12-23 DIAGNOSIS — M179 Osteoarthritis of knee, unspecified: Secondary | ICD-10-CM | POA: Diagnosis not present

## 2014-12-23 DIAGNOSIS — M216X1 Other acquired deformities of right foot: Secondary | ICD-10-CM | POA: Diagnosis not present

## 2014-12-23 DIAGNOSIS — M1710 Unilateral primary osteoarthritis, unspecified knee: Secondary | ICD-10-CM

## 2014-12-23 NOTE — Assessment & Plan Note (Signed)
Continue with current therapy. Patient denies injections every 3-4 months. Patient would be a candidate for viscous supplementation if necessary. We discussed home exercises and patient will continue to wear the brace as tolerated. Patient will follow-up with me again on an as-needed basis.

## 2014-12-23 NOTE — Progress Notes (Signed)
[name] [place],  [address] Phone: [phone] Subjective:    I'm seeing this patient by the request  of:  Cathlean Cower, MD   CC: Right ankle pain, left knee pain  [id number] Tara [name] is a 64 y.o. female coming in with complaint of  Right ankle pain. Patient was seen previously and I think the overpronation was given her ankle pain. Patient as long she continues to wear her orthotics seems to be doing well. Noticed some mild discomfort on the mild side of the longitudinal arch with the orthotics and is wondering if something can be change.  Patient's left knee was given an injection previously for osteoarthritis. Patient has been wearing the custom orthotics and feeling better. Patient sates that there is no locking or giving out on her. Patient can do all daily activities and is resting comfortably at night.    Past medical history, social, surgical and family history all reviewed in electronic medical record.   Review of Systems: No headache, visual changes, nausea, vomiting, diarrhea, constipation, dizziness, abdominal pain, skin rash, fevers, chills, night sweats, weight loss, swollen lymph nodes, body aches, joint swelling, muscle aches, chest pain, shortness of breath, mood changes.   Objective Blood pressure 116/82, pulse 63, height 5\' 6"  (1.676 m), weight 235 lb (106.595 kg), SpO2 97 %.  General: No apparent distress alert and oriented x3 mood and affect normal, dressed appropriately.  HEENT: Pupils equal, extraocular movements intact  Respiratory: Patient's speak in full sentences and does not appear short of breath  Cardiovascular: No lower extremity edema, non tender, no erythema  Skin: Warm dry intact with no signs of infection or rash on extremities or on axial skeleton.  Abdomen: Soft nontender  Neuro: Cranial nerves II through XII are intact, neurovascularly intact in all extremities with 2+ DTRs and 2+  pulses.  Lymph: No lymphadenopathy of posterior or anterior cervical chain or axillae bilaterally.  Gait normal with good balance and coordination.  MSK:  Non tender with full range of motion and good stability and symmetric strength and tone of shoulders, elbows, wrist, hip, bilaterally.   Ankle: Left Significant bony changes of the medial and lateral malleolus still present Range of motion is full in all directions. Strength is 5/5 in all directions. Stable lateral and medial ligaments; squeeze test and kleiger test unremarkable; Talar dome was tender than previous No pain at base of 5th MT; No tenderness over cuboid; No tenderness over N spot or navicular prominence No tenderness on posterior aspects of lateral and medial malleolus Tenderness over the peroneal tendons on the right side Negative tarsal tunnel tinel's Able to walk 4 steps.  Foot exam shows the patient does have severe over pronation of the hindfoot the patient does work walk in a supinated state. Patient also has significant splaying between the first and second toes with breakdown of the transverse arch. Mild hallux rigidus noted  Knee: left Bilateral knees have valgus deformity at baseline Minimal tenderness over the medial joint line and mild tenderness over the lateral joint line ROM full in flexion and extension and lower leg rotation. Ligaments with solid consistent endpoints including ACL, PCL, LCL, MCL. Negative Mcmurray's, Apley's, and Thessalonian tests. painful patellar compression. Patellar glide severe crepitus still present. Patellar and quadriceps tendons unremarkable. Hamstring and quadriceps strength is normal.      Impression and Recommendations:     This case required medical decision making of moderate complexity.

## 2014-12-23 NOTE — Progress Notes (Signed)
Pre visit review using our clinic review tool, if applicable. No additional management support is needed unless otherwise documented below in the visit note. 

## 2014-12-23 NOTE — Patient Instructions (Signed)
Good to see you Ice can help Made adjustment to the orthotics and call if anything else needs to be change Continue the exercises for the knee.  See me again when you need me and can repeat injection every 3-4 months.

## 2014-12-23 NOTE — Assessment & Plan Note (Signed)
Better with patient's use of the orthotics. We'll continue to monitor. Patient will continue the range of motion exercises.

## 2014-12-23 NOTE — Assessment & Plan Note (Signed)
Patient had changes made to the orthotics today. We did decrease the amount of posting on the longitudinal arch. Patient says that that felt significantly better. Denies any numbing sensations. Patient will call if any other changes as necessary.

## 2015-01-10 ENCOUNTER — Other Ambulatory Visit: Payer: Self-pay

## 2015-01-12 ENCOUNTER — Encounter: Payer: Self-pay | Admitting: Internal Medicine

## 2015-01-12 DIAGNOSIS — E119 Type 2 diabetes mellitus without complications: Secondary | ICD-10-CM

## 2015-01-19 ENCOUNTER — Ambulatory Visit (INDEPENDENT_AMBULATORY_CARE_PROVIDER_SITE_OTHER): Payer: BLUE CROSS/BLUE SHIELD | Admitting: Internal Medicine

## 2015-01-19 ENCOUNTER — Other Ambulatory Visit: Payer: BLUE CROSS/BLUE SHIELD

## 2015-01-19 ENCOUNTER — Encounter: Payer: Self-pay | Admitting: Internal Medicine

## 2015-01-19 VITALS — BP 122/88 | HR 62 | Ht 66.0 in | Wt 233.0 lb

## 2015-01-19 DIAGNOSIS — R0609 Other forms of dyspnea: Secondary | ICD-10-CM | POA: Diagnosis not present

## 2015-01-19 DIAGNOSIS — D869 Sarcoidosis, unspecified: Secondary | ICD-10-CM

## 2015-01-19 DIAGNOSIS — E119 Type 2 diabetes mellitus without complications: Secondary | ICD-10-CM | POA: Diagnosis not present

## 2015-01-19 DIAGNOSIS — R0600 Dyspnea, unspecified: Secondary | ICD-10-CM

## 2015-01-19 LAB — MICROALBUMIN / CREATININE URINE RATIO
CREATININE, U: 14.1 mg/dL
MICROALB/CREAT RATIO: 5 mg/g (ref 0.0–30.0)
Microalb, Ur: <0.7 mg/dL (ref 0.0–1.9)

## 2015-01-19 NOTE — Patient Instructions (Signed)
Order- schedule PFT   Dx sarcoid, dyspnea with exertion  Try for any kind of regular exercise so you can build some stamina. With your knees, swimming might be good

## 2015-01-19 NOTE — Assessment & Plan Note (Signed)
Mostly just out of shape. Plan- reschedule PFT, suggested aerobic swimming class and etc to improve stamina.

## 2015-01-19 NOTE — Assessment & Plan Note (Signed)
Last ACE was a little higher, but don't think this is active sarcoid.

## 2015-01-19 NOTE — Progress Notes (Signed)
08/22/12- 61 yoF never smoker followed for sarcoid complicated by hypercalcemia, renal insufficency  Post Hospital-also would like to know about Iron supplement. Dr [name] PCP, Dr [facility]. Last seen here 10/04/08- had been dx'd w/ OSA but disliked CPAP and f/u NPSG 06/25/08 was negative so that issue considered resolved Sarcoid dx'd 2009 - mediastinoscopy and bronchoscopy. Prednisone x 1 year. Now hosp 1/7-1/10/14 for hypercalcemia and renal insufficiency associated with multiple pulmonary nodules of stage II sarcoid. Breast biopsy positive for noncaseating granulomas. Started on prednisone 60 mg daily at that time, anticipating one month to reduce by 5-10 mg increments. She notes that eyes burn and visual acuity is not as sharp, appetite increased on steroids. No night sweats or fever and little cough. ACE 07/23/12  86 (8-52) CT chest 07/23/12 IMPRESSION:  1. While the previously noted mediastinal and bilateral hilar  lymphadenopathy appears to have decreased, there are now  innumerable perilymphatic nodules scattered throughout the lungs  bilaterally, with a definite upper lung predominance. Overall, the  findings are compatible with sarcoidosis (i.e., progression from  stage I to stage II).  Original Report Authenticated By: [name], M.D.  09/25/12- 65 yoF never smoker followed for sarcoid complicated by hypercalcemia, renal insufficency   Dr [name] PCP, Dr Ninetta Lights. FOLLOWS FOR: increased SOB but no wheezing; "just feel winded". She remains on prednisone, started at 60 mg per day on January 10 while in hospital. Now tapering by 10 mg per week and is at 30 mg daily. ACE 08/22/12- down from 86 2 months ago to 26. She says her persistent blinking is because of dry eyes despite use of lubricant from her eye doctor.  11/03/12- 61 yoF never smoker followed for sarcoid complicated by hypercalcemia, renal insufficency   Dr [name] PCP, Dr Ninetta Lights. FOLLOWS FOR: Breathing is unchanged. Reports SOB  with exertion, chest pain and coughing from time to time. Denies chest tightness or wheezing. She did a multiple sclerosis fundraiser walk, stopping on hills. This is an unusual amount of exercise for her. Denies seasonal rhinitis complaints. Continues prednisone 20 mg daily for sarcoid. ACE 08/22/12- 26  CXR 09/25/12 Comparison: Chest x-ray of 04/21/2009 and CT of 07/23/2012.  Findings: The heart is borderline. The hila and mediastinum appear  normal. Right paratracheal thickening on the preceding chest x-ray  is less obvious. The lungs appear clear. Changes of sarcoid seen  on the recent CT examination are not clearly evident on the  radiographs because of the small size of the perilymphatic nodules  noted. No airspace disease. No effusions or pneumothoraces. No  acute bony changes.  IMPRESSION:  No acute findings in the chest.  Original Report Authenticated By: Vallery Ridge, M.D.  12/19/12- 75 yoF never smoker followed for sarcoid complicated by hypercalcemia, renal insufficency   Dr [name] PCP, Dr Ninetta Lights.ROS-see HPI FOLLOWS FOR: still notices SOB with activity Occasionally sore upper sternum, dyspnea with exertion but denies cough more swollen glands. Has been on prednisone 10 mg daily since last visit. ACE 11/03/12- 30 (8-52)   Glucose elevated on steroids CXR 09/25/12 IMPRESSION:  No acute findings in the chest.  Original Report Authenticated By: Vallery Ridge, M.D.  02/06/13- 62 yoF never smoker followed for sarcoid complicated by hypercalcemia, renal insufficency   Dr [name] PCP, Dr Ninetta Lights. FOLLOWS FOR: Pt reports breathing is about the same-- denies any other concerns at this time  has been on 1/2x20 mg prednisone every other day x3 months. Feels well. Describes one day of  transient nonexertional left anterior chest pain associated with migraine. Still, nonradiating, left of sternum. Chronic left knee pain limits exercise. Pending injection to treat.  05/15/13- 52 yoF  never smoker followed for sarcoid complicated by hypercalcemia, renal insufficency   Dr [name] PCP, Dr Ninetta Lights. FOLLOWS FOR: patient states she continues to have SOB with activity; also states she has recently had knee pain/issues/ osteoarthritis. Occasional dry cough. Knee pain limits exertion more than her dyspnea does. Has continued prednisone 10 mg every other day for sarcoid and dyspnea. CXR 02/06/13 Findings: The heart size and pulmonary vascularity are normal and  the lungs are clear. No osseous abnormality. No effusions.  IMPRESSION:  No acute disease.  Original Report Authenticated By: Lorriane Shire, M.D.  07/14/13- 62 yoF never smoker followed for sarcoid complicated by hypercalcemia, renal insufficency   Dr [name] PCP, Dr Ninetta Lights.  Husband died of kidney disease. FOLLOWS FOR: has slight wheezing at times; needs to discuss Prednisone Rx-what to take.  hospital in November with anemia and urinary infection. Was on prednisone 10 mg daily until hospital. Nephrology told her to resume prednisone 10 mg every other day. She was given Lasix 20 mg twice daily. We reviewed her ACE levels recently back up to 86, reflecting interval off of prednisone. We discussed long-term steroids and she will follow directions from nephrology about her calcium management. CXR 05/30/13 IMPRESSION:  Mild chronic lung changes noted, with underlying vascular  congestion. No definite superimposed focal airspace consolidation  seen.  Electronically Signed  By: Garald Balding M.D.  On: 05/30/2013 23:10  01/12/14- 47 yoF never smoker followed for sarcoid complicated by hypercalcemia, renal insufficency   Dr [name] PCP, Dr Ninetta Lights.  Husband died of kidney disease. FOLLOWS FOR: Pt states has occasional chest congestion at times. SOB if carrying alot of weight Tells me she is stable, but can get overheated. Nephrology/Dr Posey Pronto reducing prednisone (given for sarcoid) 10 mg->5 mg daily, to stop in August, for  kidneys. Mentions knees stiff/sore cw osteo, not hot acute arthritis  07/14/14- 63 yoF never smoker followed for sarcoid complicated by hypercalcemia, renal insufficency  FOLLOWS FOR: pt c/o occasional nonprod cough, sob with exertion.  Pt states this is stable.  She doesn't recognize any real change in exertional dyspnea. Denies fever, sweat, swollen glands, cough. ACE was 69 CXR 01/12/14 IMPRESSION: No active cardiopulmonary disease. Electronically Signed  By: Ivar Drape M.D.  On: 01/12/2014 13:35  01/19/15- 65 yoF never smoker followed for sarcoid complicated by hypercalcemia, renal insufficency  FOLLOWS FOR: c/o still having occasional nonprod cough, sob with exertion.  Still DOE, but admits deconditioned. Little cough or wheeze. Got faint [place] bags at airport.  ROS- see HPI Constitutional:   No-   weight loss, night sweats, fevers, chills, no-fatigue, lassitude. HEENT:   No-  headaches, difficulty swallowing, tooth/dental problems, sore throat,       No-  sneezing, itching, ear ache, nasal congestion, post nasal drip,  CV:  No- chest pain, no-orthopnea, PND, swelling in lower extremities, anasarca,dizziness, palpitations  Resp: + shortness of breath with exertion or at rest.              No-   productive cough,  No non-productive cough,  No- coughing up of blood.              No- wheezing.   Skin: No-   rash or lesions. GI:  No-   heartburn, indigestion, abdominal pain, nausea, vomiting,  GU:  MS:  +  joint pain or swelling.   Neuro-     nothing unusual Psych:  No- change in mood or affect. No depression or anxiety.  No memory loss.  OBJ- Physical Exam General- Alert, Oriented, Affect-appropriate, Distress- none acute,                     +overweight Skin- rash-none, lesions- none, excoriation- none, cosmetics smeared on face Lymphadenopathy- none Head- atraumatic            Eyes- Gross vision intact, PERRLA, conjunctivae clear, frequent blinking            Ears-  Hearing, canals-normal            Nose- Clear, no-Septal dev, mucus, polyps, erosion, perforation             Throat- Mallampati II , mucosa clear , drainage- none, tonsils- atrophic Neck- flexible , trachea midline, no stridor , thyroid nl, carotid no bruit Chest - symmetrical excursion , unlabored           Heart/CV- RRR , no murmur , no gallop  , no rub, nl s1 s2                           - JVD- none , edema-none, stasis changes- none, varices- none           Lung- clear to P&A, wheeze- none, cough- none , dullness-none, rub- none           Chest wall-  Abd-  Br/ Gen/ Rectal- Not done, not indicated Extrem- cyanosis- none, clubbing, none, atrophy- none, strength- nl Neuro- +blinking, grimacing, ? Tics or frontal lobe ?

## 2015-01-28 ENCOUNTER — Other Ambulatory Visit: Payer: Self-pay | Admitting: Internal Medicine

## 2015-01-31 ENCOUNTER — Other Ambulatory Visit: Payer: Self-pay

## 2015-01-31 MED ORDER — ATENOLOL 50 MG PO TABS: 50.0000 mg | ORAL_TABLET | Freq: Every day | ORAL | Status: DC

## 2015-02-07 ENCOUNTER — Ambulatory Visit (INDEPENDENT_AMBULATORY_CARE_PROVIDER_SITE_OTHER): Payer: BLUE CROSS/BLUE SHIELD | Admitting: Internal Medicine

## 2015-02-07 DIAGNOSIS — R0609 Other forms of dyspnea: Secondary | ICD-10-CM

## 2015-02-07 DIAGNOSIS — D869 Sarcoidosis, unspecified: Secondary | ICD-10-CM | POA: Diagnosis not present

## 2015-02-07 DIAGNOSIS — R0600 Dyspnea, unspecified: Secondary | ICD-10-CM

## 2015-02-07 LAB — PULMONARY FUNCTION TEST
DL/VA % PRED: 104 %
DL/VA: 5.28 ml/min/mmHg/L
DLCO unc % pred: 78 %
DLCO unc: 21.16 ml/min/mmHg
FEF 25-75 POST: 2.43 L/s
FEF 25-75 Pre: 2.01 L/sec
FEF2575-%Change-Post: 20 %
FEF2575-%Pred-Post: 102 %
FEF2575-%Pred-Pre: 85 %
FEV1-%Change-Post: 5 %
FEV1-%PRED-POST: 81 %
FEV1-%Pred-Pre: 77 %
FEV1-Post: 2.19 L
FEV1-Pre: 2.08 L
FEV1FVC-%Change-Post: 7 %
FEV1FVC-%Pred-Pre: 102 %
FEV6-%Change-Post: -1 %
FEV6-%PRED-PRE: 76 %
FEV6-%Pred-Post: 75 %
FEV6-PRE: 2.59 L
FEV6-Post: 2.55 L
FEV6FVC-%Pred-Post: 103 %
FEV6FVC-%Pred-Pre: 103 %
FVC-%Change-Post: -1 %
FVC-%PRED-PRE: 74 %
FVC-%Pred-Post: 73 %
FVC-PRE: 2.59 L
FVC-Post: 2.55 L
POST FEV6/FVC RATIO: 100 %
PRE FEV1/FVC RATIO: 80 %
Post FEV1/FVC ratio: 86 %
Pre FEV6/FVC Ratio: 100 %
RV % PRED: 55 %
RV: 1.19 L
TLC % pred: 81 %
TLC: 4.35 L

## 2015-02-07 NOTE — Progress Notes (Signed)
PFT done today. Hena Ewalt,CMA  

## 2015-02-08 ENCOUNTER — Other Ambulatory Visit: Payer: Self-pay | Admitting: Internal Medicine

## 2015-02-09 ENCOUNTER — Encounter: Payer: Self-pay | Admitting: Gynecology

## 2015-02-09 ENCOUNTER — Ambulatory Visit (INDEPENDENT_AMBULATORY_CARE_PROVIDER_SITE_OTHER): Payer: BLUE CROSS/BLUE SHIELD | Admitting: Gynecology

## 2015-02-09 VITALS — BP 122/78 | Ht 66.0 in | Wt 234.0 lb

## 2015-02-09 DIAGNOSIS — Z01419 Encounter for gynecological examination (general) (routine) without abnormal findings: Secondary | ICD-10-CM | POA: Diagnosis not present

## 2015-02-09 DIAGNOSIS — N952 Postmenopausal atrophic vaginitis: Secondary | ICD-10-CM | POA: Diagnosis not present

## 2015-02-09 NOTE — Patient Instructions (Signed)
Schedule your mammogram as we discussed.  You may obtain a copy of any labs that were done today by logging onto MyChart as outlined in the instructions provided with your AVS (after visit summary). The office will not call with normal lab results but certainly if there are any significant abnormalities then we will contact you.   Health Maintenance, Female A healthy lifestyle and preventative care can promote health and wellness.  Maintain regular health, dental, and eye exams.  Eat a healthy diet. Foods like vegetables, fruits, whole grains, low-fat dairy products, and lean protein foods contain the nutrients you need without too many calories. Decrease your intake of foods high in solid fats, added sugars, and salt. Get information about a proper diet from your caregiver, if necessary.  Regular physical exercise is one of the most important things you can do for your health. Most adults should get at least 150 minutes of moderate-intensity exercise (any activity that increases your heart rate and causes you to sweat) each week. In addition, most adults need muscle-strengthening exercises on 2 or more days a week.   Maintain a healthy weight. The body mass index (BMI) is a screening tool to identify possible weight problems. It provides an estimate of body fat based on height and weight. Your caregiver can help determine your BMI, and can help you achieve or maintain a healthy weight. For adults 20 years and older:  A BMI below 18.5 is considered underweight.  A BMI of 18.5 to 24.9 is normal.  A BMI of 25 to 29.9 is considered overweight.  A BMI of 30 and above is considered obese.  Maintain normal blood lipids and cholesterol by exercising and minimizing your intake of saturated fat. Eat a balanced diet with plenty of fruits and vegetables. Blood tests for lipids and cholesterol should begin at age 66 and be repeated every 5 years. If your lipid or cholesterol levels are high, you are over  50, or you are a high risk for heart disease, you may need your cholesterol levels checked more frequently.Ongoing high lipid and cholesterol levels should be treated with medicines if diet and exercise are not effective.  If you smoke, find out from your caregiver how to quit. If you do not use tobacco, do not start.  Lung cancer screening is recommended for adults aged 26 80 years who are at high risk for developing lung cancer because of a history of smoking. Yearly low-dose computed tomography (CT) is recommended for people who have at least a 30-pack-year history of smoking and are a current smoker or have quit within the past 15 years. A pack year of smoking is smoking an average of 1 pack of cigarettes a day for 1 year (for example: 1 pack a day for 30 years or 2 packs a day for 15 years). Yearly screening should continue until the smoker has stopped smoking for at least 15 years. Yearly screening should also be stopped for people who develop a health problem that would prevent them from having lung cancer treatment.  If you are pregnant, do not drink alcohol. If you are breastfeeding, be very cautious about drinking alcohol. If you are not pregnant and choose to drink alcohol, do not exceed 1 drink per day. One drink is considered to be 12 ounces (355 mL) of beer, 5 ounces (148 mL) of wine, or 1.5 ounces (44 mL) of liquor.  Avoid use of street drugs. Do not share needles with anyone. Ask for help if  if you need support or instructions about stopping the use of drugs.  High blood pressure causes heart disease and increases the risk of stroke. Blood pressure should be checked at least every 1 to 2 years. Ongoing high blood pressure should be treated with medicines, if weight loss and exercise are not effective.  If you are 55 to 62 years old, ask your caregiver if you should take aspirin to prevent strokes.  Diabetes screening involves taking a blood sample to check your fasting blood sugar level.  This should be done once every 3 years, after age 45, if you are within normal weight and without risk factors for diabetes. Testing should be considered at a younger age or be carried out more frequently if you are overweight and have at least 1 risk factor for diabetes.  Breast cancer screening is essential preventative care for women. You should practice "breast self-awareness." This means understanding the normal appearance and feel of your breasts and may include breast self-examination. Any changes detected, no matter how small, should be reported to a caregiver. Women in their 20s and 30s should have a clinical breast exam (CBE) by a caregiver as part of a regular health exam every 1 to 3 years. After age 40, women should have a CBE every year. Starting at age 40, women should consider having a mammogram (breast X-ray) every year. Women who have a family history of breast cancer should talk to their caregiver about genetic screening. Women at a high risk of breast cancer should talk to their caregiver about having an MRI and a mammogram every year.  Breast cancer gene (BRCA)-related cancer risk assessment is recommended for women who have family members with BRCA-related cancers. BRCA-related cancers include breast, ovarian, tubal, and peritoneal cancers. Having family members with these cancers may be associated with an increased risk for harmful changes (mutations) in the breast cancer genes BRCA1 and BRCA2. Results of the assessment will determine the need for genetic counseling and BRCA1 and BRCA2 testing.  The Pap test is a screening test for cervical cancer. Women should have a Pap test starting at age 21. Between ages 21 and 29, Pap tests should be repeated every 2 years. Beginning at age 30, you should have a Pap test every 3 years as long as the past 3 Pap tests have been normal. If you had a hysterectomy for a problem that was not cancer or a condition that could lead to cancer, then you no  longer need Pap tests. If you are between ages 65 and 70, and you have had normal Pap tests going back 10 years, you no longer need Pap tests. If you have had past treatment for cervical cancer or a condition that could lead to cancer, you need Pap tests and screening for cancer for at least 20 years after your treatment. If Pap tests have been discontinued, risk factors (such as a new sexual partner) need to be reassessed to determine if screening should be resumed. Some women have medical problems that increase the chance of getting cervical cancer. In these cases, your caregiver may recommend more frequent screening and Pap tests.  The human papillomavirus (HPV) test is an additional test that may be used for cervical cancer screening. The HPV test looks for the virus that can cause the cell changes on the cervix. The cells collected during the Pap test can be tested for HPV. The HPV test could be used to screen women aged 30 years and older, and   be used in women of any age who have unclear Pap test results. After the age of 63, women should have HPV testing at the same frequency as a Pap test.  Colorectal cancer can be detected and often prevented. Most routine colorectal cancer screening begins at the age of 67 and continues through age 18. However, your caregiver Emel recommend screening at an earlier age if you have risk factors for colon cancer. On a yearly basis, your caregiver Vila provide home test kits to check for hidden blood in the stool. Use of a small camera at the end of a tube, to directly examine the colon (sigmoidoscopy or colonoscopy), can detect the earliest forms of colorectal cancer. Talk to your caregiver about this at age 65, when routine screening begins. Direct examination of the colon should be repeated every 5 to 10 years through age 70, unless early forms of pre-cancerous polyps or small growths are found.  Hepatitis C blood testing is recommended for all people born from  [birth year] through 1963 and any individual with known risks for hepatitis C.  Practice safe sex. Use condoms and avoid high-risk sexual practices to reduce the spread of sexually transmitted infections (STIs). Sexually active women aged 26 and younger should be checked for Chlamydia, which is a common sexually transmitted infection. Older women with new or multiple partners should also be tested for Chlamydia. Testing for other STIs is recommended if you are sexually active and at increased risk.  Osteoporosis is a disease in which the bones lose minerals and strength with aging. This can result in serious bone fractures. The risk of osteoporosis can be identified using a bone density scan. Women ages 9 and over and women at risk for fractures or osteoporosis should discuss screening with their caregivers. Ask your caregiver whether you should be taking a calcium supplement or vitamin D to reduce the rate of osteoporosis.  Menopause can be associated with physical symptoms and risks. Hormone replacement therapy is available to decrease symptoms and risks. You should talk to your caregiver about whether hormone replacement therapy is right for you.  Use sunscreen. Apply sunscreen liberally and repeatedly throughout the day. You should seek shade when your shadow is shorter than you. Protect yourself by wearing long sleeves, pants, a wide-brimmed hat, and sunglasses year round, whenever you are outdoors.  Notify your caregiver of new moles or changes in moles, especially if there is a change in shape or color. Also notify your caregiver if a mole is larger than the size of a pencil eraser.  Stay current with your immunizations. Document Released: 01/15/2011 Document Revised: 10/27/2012 Document Reviewed: 01/15/2011 [facility] Patient Information 2014 [employer].

## 2015-02-09 NOTE — Progress Notes (Signed)
Tara [name] August 25, 1950 [id number]        64 y.o.  G0P0 for annual exam.  Doing well without complaints.  Past medical history,surgical history, problem list, medications, allergies, family history and social history were all reviewed and documented as reviewed in the EPIC chart.  ROS:  Performed with pertinent positives and negatives included in the history, assessment and plan.   Additional significant findings :  none   Exam: [name] Counsellor Vitals:   02/09/15 1516  BP: 122/78  Height: 5\' 6"  (1.676 m)  Weight: 234 lb (106.142 kg)   General appearance:  Normal affect, orientation and appearance. Skin: Grossly normal HEENT: Without gross lesions.  No cervical or supraclavicular adenopathy. Thyroid normal.  Lungs:  Clear without wheezing, rales or rhonchi Cardiac: RR, without RMG Abdominal:  Soft, nontender, without masses, guarding, rebound, organomegaly or hernia Breasts:  Examined lying and sitting without masses, retractions, discharge or axillary adenopathy. Pelvic:  Ext/BUS/vagina with atrophic changes  Cervix high in the vaginal vault. Grossly normal  Uterus difficult to palpate but grossly normal on bimanual   Adnexa  Without gross masses or tenderness    Anus and perineum  Normal   Rectovaginal  Normal sphincter tone without palpated masses or tenderness.    Assessment/Plan:  64 y.o. G0P0 female for annual exam.   1. Postmenopausal/atrophic genital changes. Patient without significant symptoms of hot flashes, night sweats, vaginal dryness or any vaginal bleeding. Patient knows to report any vaginal bleeding. 2. Pap smear/HPV negative 2014. No Pap smear done today. No history of abnormal Pap smears previously. Plan repeat Pap smear at 3-5 year interval. 3. DEXA 2014 normal. Plan repeat five-year interval. 4. Colonoscopy 2014. Repeated their recommended interval. 5. Mammogram overdo. Patient knows to schedule and plans to do this coming month. SBE monthly  reviewed. 6. Health maintenance. No routine lab work done as patient does this at her primary physician's office. Follow up in one year, sooner as needed.   Anastasio Auerbach MD, 3:34 PM 02/09/2015

## 2015-03-25 ENCOUNTER — Other Ambulatory Visit (INDEPENDENT_AMBULATORY_CARE_PROVIDER_SITE_OTHER): Payer: BLUE CROSS/BLUE SHIELD

## 2015-03-25 ENCOUNTER — Encounter: Payer: Self-pay | Admitting: Family Medicine

## 2015-03-25 ENCOUNTER — Ambulatory Visit (INDEPENDENT_AMBULATORY_CARE_PROVIDER_SITE_OTHER): Payer: BLUE CROSS/BLUE SHIELD | Admitting: Family Medicine

## 2015-03-25 VITALS — BP 132/82 | HR 73 | Wt 243.0 lb

## 2015-03-25 DIAGNOSIS — M25571 Pain in right ankle and joints of right foot: Secondary | ICD-10-CM | POA: Diagnosis not present

## 2015-03-25 NOTE — Progress Notes (Signed)
[name] [address] [place], [place] [address] Phone: [phone] Subjective:    I'm seeing this patient by the request  of:  Cathlean Cower, MD   CC: Right ankle pain follow up  [id number] Tara [name] is a 64 y.o. female coming in with complaint of  Right ankle pain. Patient was seen previously and I think the overpronation was given her ankle pain. Patient as long she continues to wear her orthotics seems to be doing well.  He is having no more pain overall. Patient states that walking long distances causes her to actually stop. Patient has not been able to get new shoes. Patient is also not taking vitamin D because her nephrologist has advised against it secondary to her elevated calcium is likely secondary to the sarcoidosis. Patient states that the pain goes away but does increase when she has to do a lot of walking.  Left knee pain is doing better.  Past medical history, social, surgical and family history all reviewed in electronic medical record.   Review of Systems: No headache, visual changes, nausea, vomiting, diarrhea, constipation, dizziness, abdominal pain, skin rash, fevers, chills, night sweats, weight loss, swollen lymph nodes, body aches, joint swelling, muscle aches, chest pain, shortness of breath, mood changes.   Objective Blood pressure 132/82, pulse 73, weight 243 lb (110.224 kg), SpO2 99 %.  General: No apparent distress alert and oriented x3 mood and affect normal, dressed appropriately.  HEENT: Pupils equal, extraocular movements intact  Respiratory: Patient's speak in full sentences and does not appear short of breath  Cardiovascular: No lower extremity edema, non tender, no erythema  Skin: Warm dry intact with no signs of infection or rash on extremities or on axial skeleton.  Abdomen: Soft nontender  Neuro: Cranial nerves II through XII are intact, neurovascularly intact in all extremities with 2+ DTRs and 2+ pulses.    Lymph: No lymphadenopathy of posterior or anterior cervical chain or axillae bilaterally.  Gait normal with good balance and coordination.  MSK:  Non tender with full range of motion and good stability and symmetric strength and tone of shoulders, elbows, wrist, hip, bilaterally.   Ankle: Left Significant bony changes of the medial and lateral malleolus still present Range of motion is full in all directions. Strength is 5/5 in all directions. Stable lateral and medial ligaments; squeeze test and kleiger test unremarkable; Talar dome was tender and more than previous exam No pain at base of 5th MT; No tenderness over cuboid; No tenderness over N spot or navicular prominence No tenderness on posterior aspects of lateral and medial malleolus Tenderness over the peroneal tendons on the right side Negative tarsal tunnel tinel's Able to walk 4 steps.  Foot exam shows the patient does have severe over pronation of the hindfoot the patient does work walk in a supinated state. Patient also has significant splaying between the first and second toes with breakdown of the transverse arch. Mild hallux rigidus noted   Procedure: Real-time Ultrasound Guided Injection of left ankle mortise Device: GE Logiq E  Ultrasound guided injection is preferred based studies that show increased duration, increased effect, greater accuracy, decreased procedural pain, increased response rate, and decreased cost with ultrasound guided versus blind injection.  Verbal informed consent obtained.  Time-out conducted.  Noted no overlying erythema, induration, or other signs of local infection.  Skin prepped in a sterile fashion.  Local anesthesia: Topical Ethyl chloride.  With sterile technique and under real time  ultrasound guidance: With a 21-gauge 2 inch needle patient was injected with a total of 0.5 mL of 0.5% Marcaine and 0.5 mL of Kenalog 40 mg/dL  Completed without difficulty  Pain immediately resolved  suggesting accurate placement of the medication.  Advised to call if fevers/chills, erythema, induration, drainage, or persistent bleeding.  Images permanently stored and available for review in the ultrasound unit.  Impression: Technically successful ultrasound guided injection.    Impression and Recommendations:     This case required medical decision making of moderate complexity.

## 2015-03-25 NOTE — Assessment & Plan Note (Signed)
Patient's pain is secondary to underlying arthritis. Patient's hypocalcemia as well as [place] likely not helping. I do feel that actually vitamin D supplementation can, as patient to have less calcium and deposits in the joints patient will consider this. We discussed icing regimen. We discussed getting proper shoes and patient will go shopping. Patient will continue with the custom orthotics. Patient and will come back and see me again in 3 weeks to make sure she is responding.

## 2015-03-25 NOTE — Patient Instructions (Signed)
Good to see you Looking at shoes new balance greater than 700 and look for 2E width if possible.  Ice the ankle in 6 hours.  Try to avoid being barefoot for the next 2 weeks even in the house.  See me again in 3 weeks.

## 2015-03-29 ENCOUNTER — Encounter: Payer: Self-pay | Admitting: Internal Medicine

## 2015-03-29 ENCOUNTER — Ambulatory Visit (INDEPENDENT_AMBULATORY_CARE_PROVIDER_SITE_OTHER): Payer: BLUE CROSS/BLUE SHIELD | Admitting: Internal Medicine

## 2015-03-29 ENCOUNTER — Other Ambulatory Visit (INDEPENDENT_AMBULATORY_CARE_PROVIDER_SITE_OTHER): Payer: BLUE CROSS/BLUE SHIELD

## 2015-03-29 VITALS — BP 132/86 | HR 61 | Temp 98.0°F | Ht 66.0 in | Wt 236.0 lb

## 2015-03-29 DIAGNOSIS — I10 Essential (primary) hypertension: Secondary | ICD-10-CM

## 2015-03-29 DIAGNOSIS — E099 Drug or chemical induced diabetes mellitus without complications: Secondary | ICD-10-CM

## 2015-03-29 DIAGNOSIS — T380X5A Adverse effect of glucocorticoids and synthetic analogues, initial encounter: Secondary | ICD-10-CM

## 2015-03-29 DIAGNOSIS — Z0000 Encounter for general adult medical examination without abnormal findings: Secondary | ICD-10-CM

## 2015-03-29 DIAGNOSIS — E785 Hyperlipidemia, unspecified: Secondary | ICD-10-CM

## 2015-03-29 DIAGNOSIS — Z0189 Encounter for other specified special examinations: Secondary | ICD-10-CM

## 2015-03-29 DIAGNOSIS — N184 Chronic kidney disease, stage 4 (severe): Secondary | ICD-10-CM

## 2015-03-29 LAB — HEMOGLOBIN A1C: Hgb A1c MFr Bld: 5.8 % (ref 4.6–6.5)

## 2015-03-29 NOTE — Assessment & Plan Note (Signed)
stable overall by history and exam, recent data reviewed with pt, and pt to continue medical treatment as before,  to f/u any worsening symptoms or concerns BP Readings from Last 3 Encounters:  03/29/15 132/86  03/25/15 132/82  02/09/15 122/78

## 2015-03-29 NOTE — Progress Notes (Signed)
Pre visit review using our clinic review tool, if applicable. No additional management support is needed unless otherwise documented below in the visit note. 

## 2015-03-29 NOTE — Patient Instructions (Signed)

## 2015-03-29 NOTE — Assessment & Plan Note (Signed)
stable overall by history and exam, recent data reviewed with pt, and pt to continue medical treatment as before,  to f/u any worsening symptoms or concerns Lab Results  Component Value Date   LDLCALC 133* 09/22/2013   For fu lab

## 2015-03-29 NOTE — Assessment & Plan Note (Signed)
?   Mild worse recent Lab Results  Component Value Date   CREATININE 1.96* 11/02/2014   Cont f/u with renal as planned

## 2015-03-29 NOTE — Progress Notes (Signed)
Subjective:    Patient ID: Tara [name], female    DOB: Mar 28, 1951, 64 y.o.   MRN: [id number]  HPI  Here to f/u; overall doing ok,  Pt denies chest pain, increasing sob or doe, wheezing, orthopnea, PND, increased LE swelling, palpitations, dizziness or syncope.  Pt denies new neurological symptoms such as new headache, or facial or extremity weakness or numbness.  Pt denies polydipsia, polyuria, or low sugar episode.   Pt denies new neurological symptoms such as new headache, or facial or extremity weakness or numbness.   Pt states overall good compliance with meds, mostly trying to follow appropriate diet, with wt overall stable,  but little exercise however.  Has been seeing Dr Tamala Julian for right ankle pain, and now left knee hurting x 2-3 days. Past Medical History  Diagnosis Date  . ANKLE PAIN, LEFT 04/01/2008  . ANXIETY 04/17/2007  . COLONIC POLYPS, HX OF 08/01/2007  . CONTUSIONS, MULTIPLE 04/01/2009  . DEPRESSION 04/17/2007  . DIZZINESS 08/01/2007  . DYSPNEA 08/01/2007  . Enlargement of lymph nodes 08/13/2007  . GLUCOSE INTOLERANCE 08/01/2007  . HYPERLIPIDEMIA 08/01/2007  . HYPERSOMNIA 07/28/2008  . HYPERTENSION 04/17/2007  . JOINT EFFUSION, LEFT KNEE 06/02/2010  . Loose body in knee 04/01/2009  . Morbid obesity 04/20/2007  . OTHER DISEASES OF LUNG NOT ELSEWHERE CLASSIFIED 08/01/2007  . Pain in joint, lower leg 04/01/2009  . PERIPHERAL EDEMA 04/21/2009  . Sarcoidosis 09/25/2007  . SHOULDER PAIN, LEFT 04/01/2009  . Impaired glucose tolerance 03/23/2011  . Migraines     "stopped 3-4 yr ago" (07/23/2012)  . 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 Chronic kidney disease    Past Surgical History  Procedure Laterality Date  . Myomectomy  1994  . Fracture surgery  ?02/1997    "left upper arm; put rod in" (07/23/2012)  . Refractive surgery  08/1998    "both eyes" (07/23/2012)  . Combined mediastinoscopy and bronchoscopy  08/2007  . Gum surgery  2000-?2009    "several ORs; soft tissue graft; took material  from roof of mouth" (07/23/2012  . Knee arthroscopy  03/2004; 06/2009    "right; left" Dr. Theda Sers  . Lymph node biopsy  ~ 2009    "for sarcoidosis; don't know exactly which nodes" (07/23/2102)  . Combined mediastinoscopy and bronchoscopy  2009  . Breast surgery      Biopsy benign    reports that she has never smoked. She has never used smokeless tobacco. She reports that she drinks about 4.2 oz of alcohol per week. She reports that she does not use illicit drugs. family history includes Asthma in her sister; Diabetes in her mother; Heart disease (age of onset: 68) in her father; Hypertension in her father, mother, and sister; Stroke in her mother. There is no history of Colon cancer. Allergies  Allergen Reactions  . Floxin [Ocuflox]     shaky  . Fluoxetine Hcl     Suicidal thoughts  . Sulfonamide Derivatives Rash   Current Outpatient Prescriptions on File Prior to Visit  Medication Sig Dispense Refill  . amphetamine-dextroamphetamine (ADDERALL) 30 MG tablet Take 30 mg by mouth 2 (two) times daily.    Marland Kitchen aspirin 81 MG tablet Take 81 mg by mouth daily.      Marland Kitchen atenolol (TENORMIN) 50 MG tablet Take 1 tablet (50 mg total) by mouth at bedtime. 90 tablet 3  . calcitonin, salmon, (MIACALCIN/FORTICAL) 200 UNIT/ACT nasal spray Place 1 spray into alternate nostrils.  2  . clorazepate (TRANXENE) 7.5 MG  tablet Take 7.5 mg by mouth QID.     Marland Kitchen desvenlafaxine (PRISTIQ) 50 MG 24 hr tablet Take 50 mg by mouth daily.    Marland Kitchen diltiazem (CARTIA XT) 240 MG 24 hr capsule Take 1 capsule (240 mg total) by mouth daily. 90 capsule 3  . ferrous sulfate 325 (65 FE) MG tablet Take 325 mg by mouth daily with breakfast.    . glucosamine-chondroitin 500-400 MG tablet Take 1 tablet by mouth 3 (three) times daily.    . Multiple Vitamin (MULTIVITAMIN) tablet Take 1 tablet by mouth daily.    Marland Kitchen torsemide (DEMADEX) 20 MG tablet Take 1 tablet (20 mg total) by mouth daily. 90 tablet 3  . valsartan (DIOVAN) 320 MG tablet Take 1  tablet (320 mg total) by mouth daily. 90 tablet 3  . zaleplon (SONATA) 10 MG capsule Take 10 mg by mouth at bedtime.     No current facility-administered medications on file prior to visit.      Review of Systems  Constitutional: Negative for unusual diaphoresis or night sweats HENT: Negative for ringing in ear or discharge Eyes: Negative for double vision or worsening visual disturbance.  Respiratory: Negative for choking and stridor.   Gastrointestinal: Negative for vomiting or other signifcant bowel change Genitourinary: Negative for hematuria or change in urine volume.  Musculoskeletal: Negative for other MSK pain or swelling Skin: Negative for color change and worsening wound.  Neurological: Negative for tremors and numbness other than noted  Psychiatric/Behavioral: Negative for decreased concentration or agitation other than above       Objective:   Physical Exam BP 132/86 mmHg  Pulse 61  Temp(Src) 98 F (36.7 C) (Oral)  Ht 5\' 6"  (1.676 m)  Wt 236 lb (107.049 kg)  BMI 38.11 kg/m2  SpO2 97% VS noted,  Constitutional: Pt appears in no significant distress HENT: Head: NCAT.  Right Ear: External ear normal.  Left Ear: External ear normal.  Eyes: . Pupils are equal, round, and reactive to light. Conjunctivae and EOM are normal Neck: Normal range of motion. Neck supple.  Cardiovascular: Normal rate and regular rhythm.   Pulmonary/Chest: Effort normal and breath sounds without rales or wheezing.  Neurological: Pt is alert. Not confused , motor grossly intact Skin: Skin is warm. No rash, no LE edema Psychiatric: Pt behavior is normal. No agitation.  Left knee with bony deg changes, small effusion       Assessment & Plan:

## 2015-04-15 ENCOUNTER — Ambulatory Visit: Payer: BLUE CROSS/BLUE SHIELD | Admitting: Internal Medicine

## 2015-04-18 ENCOUNTER — Encounter: Payer: Self-pay | Admitting: Family Medicine

## 2015-04-18 ENCOUNTER — Ambulatory Visit (INDEPENDENT_AMBULATORY_CARE_PROVIDER_SITE_OTHER): Payer: BLUE CROSS/BLUE SHIELD | Admitting: Family Medicine

## 2015-04-18 VITALS — BP 130/84 | HR 61 | Ht 66.0 in | Wt 236.0 lb

## 2015-04-18 DIAGNOSIS — M216X1 Other acquired deformities of right foot: Secondary | ICD-10-CM

## 2015-04-18 DIAGNOSIS — M1710 Unilateral primary osteoarthritis, unspecified knee: Secondary | ICD-10-CM

## 2015-04-18 DIAGNOSIS — M179 Osteoarthritis of knee, unspecified: Secondary | ICD-10-CM

## 2015-04-18 DIAGNOSIS — M25571 Pain in right ankle and joints of right foot: Secondary | ICD-10-CM | POA: Diagnosis not present

## 2015-04-18 NOTE — Assessment & Plan Note (Signed)
Significant arthritis but doing much better after the injection. She is 70% better. I do think that her sarcoidosis will likely give her some difficult he from time to time. Patient will continue to monitor. We discussed once again about different supplementation the can be beneficial. Encourage her to continue to wear the orthotics as well as proper shoes. Patient can see me every 3 months for injection if necessary.

## 2015-04-18 NOTE — Assessment & Plan Note (Signed)
We'll continue to monitor knee. If worsening symptoms patient may need injection.

## 2015-04-18 NOTE — Progress Notes (Signed)
Pre visit review using our clinic review tool, if applicable. No additional management support is needed unless otherwise documented below in the visit note. 

## 2015-04-18 NOTE — Assessment & Plan Note (Signed)
Continue orthotics.

## 2015-04-18 NOTE — Patient Instructions (Addendum)
It is good to see you Conitnue with the good shoes Take 1/2 cup of orange juice with the iron to help with absorption.  Look into Dr. Windell Hummingbird.  Valarian root at night Turmeric 500mg  daily can help with some of the pains.  Lets keep trucking along and can repeat injection every 3 months.

## 2015-04-18 NOTE — Progress Notes (Signed)
   [name] [place], [place] [address] Phone: [phone] Subjective:    I'm seeing this patient by the request  of:  Cathlean Cower, MD   CC: Right ankle pain follow up  [id number] Tara [name] is a 64 y.o. female coming in with complaint of  Right ankle pain. Patient was seen previously and I think the overpronation was given her ankle pain. Patient is in custom orthotics and does have underlying osteoarthritic changes. Patient was given an injection in the ankle mortise at last exam. Patient was to increase her activity slowly. Patient states she is a proximally 75% better. States that she can do a lot more activity. States that sitting for long amount of time can give her some mild discomfort. Patient denies though any locking or any feeling of giving out on her. Overall is happy with the results.  Left knee pain is doing better. Still some mild dull aching pain from time to time but not as severe as what spent. Seems to be improving slowly.  Past medical history, social, surgical and family history all reviewed in electronic medical record.   Review of Systems: No headache, visual changes, nausea, vomiting, diarrhea, constipation, dizziness, abdominal pain, skin rash, fevers, chills, night sweats, weight loss, swollen lymph nodes, body aches, joint swelling, muscle aches, chest pain, shortness of breath, mood changes.   Objective Blood pressure 130/84, pulse 61, height 5\' 6"  (1.676 m), weight 236 lb (107.049 kg), SpO2 97 %.  General: No apparent distress alert and oriented x3 mood and affect normal, dressed appropriately.  HEENT: Pupils equal, extraocular movements intact  Respiratory: Patient's speak in full sentences and does not appear short of breath  Cardiovascular: No lower extremity edema, non tender, no erythema  Skin: Warm dry intact with no signs of infection or rash on extremities or on axial skeleton.  Abdomen: Soft nontender    Neuro: Cranial nerves II through XII are intact, neurovascularly intact in all extremities with 2+ DTRs and 2+ pulses.  Lymph: No lymphadenopathy of posterior or anterior cervical chain or axillae bilaterally.  Gait normal with good balance and coordination.  MSK:  Non tender with full range of motion and good stability and symmetric strength and tone of shoulders, elbows, wrist, hip, bilaterally.   Ankle: Left Significant bony changes of the medial and lateral malleolus still present Range of motion is full in all directions. Strength is 5/5 in all directions. Stable lateral and medial ligaments; squeeze test and kleiger test unremarkable; Nontender over the talar dome which is an improvement No pain at base of 5th MT; No tenderness over cuboid; No tenderness over N spot or navicular prominence No tenderness on posterior aspects of lateral and medial malleolus Only minimal tenderness over the peroneal tendon still remaining. Negative tarsal tunnel tinel's Able to walk 4 steps.  Foot exam shows the patient does have severe over pronation of the hindfoot the patient does work walk in a supinated state. Patient also has significant splaying between the first and second toes with breakdown of the transverse arch. Mild hallux rigidus noted      Impression and Recommendations:     This case required medical decision making of moderate complexity.

## 2015-05-02 ENCOUNTER — Other Ambulatory Visit: Payer: Self-pay | Admitting: Internal Medicine

## 2015-05-04 ENCOUNTER — Encounter: Payer: Self-pay | Admitting: Internal Medicine

## 2015-05-04 ENCOUNTER — Ambulatory Visit: Payer: BLUE CROSS/BLUE SHIELD | Admitting: Internal Medicine

## 2015-05-04 VITALS — BP 120/78 | HR 62 | Ht 66.0 in | Wt 240.6 lb

## 2015-05-04 DIAGNOSIS — D869 Sarcoidosis, unspecified: Secondary | ICD-10-CM

## 2015-05-04 NOTE — Progress Notes (Signed)
08/22/12- 61 yoF never smoker followed for sarcoid complicated by hypercalcemia, renal insufficency  [facility]-also would like to know about Iron supplement. Dr [name] PCP, Dr [facility]. Last seen here 10/04/08- had been dx'd w/ OSA but disliked CPAP and f/u NPSG 06/25/08 was negative so that issue considered resolved Sarcoid dx'd 2009 - mediastinoscopy and bronchoscopy. Prednisone x 1 year. Now hosp 1/7-1/10/14 for hypercalcemia and renal insufficiency associated with multiple pulmonary nodules of stage II sarcoid. Breast biopsy positive for noncaseating granulomas. Started on prednisone 60 mg daily at that time, anticipating one month to reduce by 5-10 mg increments. She notes that eyes burn and visual acuity is not as sharp, appetite increased on steroids. No night sweats or fever and little cough. ACE 07/23/12  86 (8-52) CT chest 07/23/12 IMPRESSION:  1. While the previously noted mediastinal and bilateral hilar  lymphadenopathy appears to have decreased, there are now  innumerable perilymphatic nodules scattered throughout the lungs  bilaterally, with a definite upper lung predominance. Overall, the  findings are compatible with sarcoidosis (i.e., progression from  stage I to stage II).  Original Report Authenticated By: [name], M.D.  09/25/12- 64 yoF never smoker followed for sarcoid complicated by hypercalcemia, renal insufficency   Dr [name] PCP, Dr Ninetta Lights. FOLLOWS FOR: increased SOB but no wheezing; "just feel winded". She remains on prednisone, started at 60 mg per day on January 10 while in hospital. Now tapering by 10 mg per week and is at 30 mg daily. ACE 08/22/12- down from 86 2 months ago to 26. She says her persistent blinking is because of dry eyes despite use of lubricant from her eye doctor.  11/03/12- 61 yoF never smoker followed for sarcoid complicated by hypercalcemia, renal insufficency   Dr [name] PCP, Dr Ninetta Lights. FOLLOWS FOR: Breathing is unchanged. Reports SOB  with exertion, chest pain and coughing from time to time. Denies chest tightness or wheezing. She did a multiple sclerosis fundraiser walk, stopping on hills. This is an unusual amount of exercise for her. Denies seasonal rhinitis complaints. Continues prednisone 20 mg daily for sarcoid. ACE 08/22/12- 26  CXR 09/25/12 Comparison: Chest x-ray of 04/21/2009 and CT of 07/23/2012.  Findings: The heart is borderline. The hila and mediastinum appear  normal. Right paratracheal thickening on the preceding chest x-ray  is less obvious. The lungs appear clear. Changes of sarcoid seen  on the recent CT examination are not clearly evident on the  radiographs because of the small size of the perilymphatic nodules  noted. No airspace disease. No effusions or pneumothoraces. No  acute bony changes.  IMPRESSION:  No acute findings in the chest.  Original Report Authenticated By: Vallery Ridge, M.D.  12/19/12- 33 yoF never smoker followed for sarcoid complicated by hypercalcemia, renal insufficency   Dr [name] PCP, Dr Ninetta Lights.ROS-see HPI FOLLOWS FOR: still notices SOB with activity Occasionally sore upper sternum, dyspnea with exertion but denies cough more swollen glands. Has been on prednisone 10 mg daily since last visit. ACE 11/03/12- 30 (8-52)   Glucose elevated on steroids CXR 09/25/12 IMPRESSION:  No acute findings in the chest.  Original Report Authenticated By: Vallery Ridge, M.D.  02/06/13- 62 yoF never smoker followed for sarcoid complicated by hypercalcemia, renal insufficency   Dr [name] PCP, Dr Ninetta Lights. FOLLOWS FOR: Pt reports breathing is about the same-- denies any other concerns at this time  has been on 1/2x20 mg prednisone every other day x3 months. Feels well. Describes one day of  transient nonexertional left anterior chest pain associated with migraine. Still, nonradiating, left of sternum. Chronic left knee pain limits exercise. Pending injection to treat.  05/15/13- 73 yoF  never smoker followed for sarcoid complicated by hypercalcemia, renal insufficency   Dr [name] PCP, Dr Ninetta Lights. FOLLOWS FOR: patient states she continues to have SOB with activity; also states she has recently had knee pain/issues/ osteoarthritis. Occasional dry cough. Knee pain limits exertion more than her dyspnea does. Has continued prednisone 10 mg every other day for sarcoid and dyspnea. CXR 02/06/13 Findings: The heart size and pulmonary vascularity are normal and  the lungs are clear. No osseous abnormality. No effusions.  IMPRESSION:  No acute disease.  Original Report Authenticated By: Lorriane Shire, M.D.  07/14/13- 62 yoF never smoker followed for sarcoid complicated by hypercalcemia, renal insufficency   Dr [name] PCP, Dr Ninetta Lights.  Husband died of kidney disease. FOLLOWS FOR: has slight wheezing at times; needs to discuss Prednisone Rx-what to take.  hospital in November with anemia and urinary infection. Was on prednisone 10 mg daily until hospital. Nephrology told her to resume prednisone 10 mg every other day. She was given Lasix 20 mg twice daily. We reviewed her ACE levels recently back up to 86, reflecting interval off of prednisone. We discussed long-term steroids and she will follow directions from nephrology about her calcium management. CXR 05/30/13 IMPRESSION:  Mild chronic lung changes noted, with underlying vascular  congestion. No definite superimposed focal airspace consolidation  seen.  Electronically Signed  By: Garald Balding M.D.  On: 05/30/2013 23:10  01/12/14- 51 yoF never smoker followed for sarcoid complicated by hypercalcemia, renal insufficency   Dr [name] PCP, Dr Ninetta Lights.  Husband died of kidney disease. FOLLOWS FOR: Pt states has occasional chest congestion at times. SOB if carrying alot of weight Tells me she is stable, but can get overheated. Nephrology/Dr Posey Pronto reducing prednisone (given for sarcoid) 10 mg->5 mg daily, to stop in August, for  kidneys. Mentions knees stiff/sore cw osteo, not hot acute arthritis  07/14/14- 63 yoF never smoker followed for sarcoid complicated by hypercalcemia, renal insufficency  FOLLOWS FOR: pt c/o occasional nonprod cough, sob with exertion.  Pt states this is stable.  She doesn't recognize any real change in exertional dyspnea. Denies fever, sweat, swollen glands, cough. ACE was 69 CXR 01/12/14 IMPRESSION: No active cardiopulmonary disease. Electronically Signed  By: Ivar Drape M.D.  On: 01/12/2014 13:35  01/19/15- 15 yoF never smoker followed for sarcoid complicated by hypercalcemia, renal insufficency  FOLLOWS FOR: c/o still having occasional nonprod cough, sob with exertion.  Still DOE, but admits deconditioned. Little cough or wheeze. Got faint carrying bags at airport.  05/04/15-64 yoF never smoker followed for sarcoid complicated by hypercalcemia, renal insufficency  FOLLOWS FOR: Review PFT with patient in detail; pt continues to have SOB with exertion.  PFT 02/07/15-  ROS- see HPI Constitutional:   No-   weight loss, night sweats, fevers, chills, no-fatigue, lassitude. HEENT:   No-  headaches, difficulty swallowing, tooth/dental problems, sore throat,       No-  sneezing, itching, ear ache, nasal congestion, post nasal drip,  CV:  No- chest pain, no-orthopnea, PND, swelling in lower extremities, anasarca,dizziness, palpitations  Resp: + shortness of breath with exertion or at rest.              No-   productive cough,  No non-productive cough,  No- coughing up of blood.  No- wheezing.   Skin: No-   rash or lesions. GI:  No-   heartburn, indigestion, abdominal pain, nausea, vomiting,  GU:  MS:  +  joint pain or swelling.   Neuro-     nothing unusual Psych:  No- change in mood or affect. No depression or anxiety.  No memory loss.  OBJ- Physical Exam General- Alert, Oriented, Affect-appropriate, Distress- none acute,                     +overweight Skin- rash-none,  lesions- none, excoriation- none, cosmetics smeared on face Lymphadenopathy- none Head- atraumatic            Eyes- Gross vision intact, PERRLA, conjunctivae clear, frequent blinking            Ears- Hearing, canals-normal            Nose- Clear, no-Septal dev, mucus, polyps, erosion, perforation             Throat- Mallampati II , mucosa clear , drainage- none, tonsils- atrophic Neck- flexible , trachea midline, no stridor , thyroid nl, carotid no bruit Chest - symmetrical excursion , unlabored           Heart/CV- RRR , no murmur , no gallop  , no rub, nl s1 s2                           - JVD- none , edema-none, stasis changes- none, varices- none           Lung- clear to P&A, wheeze- none, cough- none , dullness-none, rub- none           Chest wall-  Abd-  Br/ Gen/ Rectal- Not done, not indicated Extrem- cyanosis- none, clubbing, none, atrophy- none, strength- nl Neuro- +blinking, grimacing, ? Tics or frontal lobe ?

## 2015-05-04 NOTE — Patient Instructions (Signed)
Your pulmonary function tests are really quite good and your lungs shouldn't be limiting you. Try to get a little more exercise on a long term basis and see if your stamina improves.

## 2015-05-30 ENCOUNTER — Ambulatory Visit (INDEPENDENT_AMBULATORY_CARE_PROVIDER_SITE_OTHER): Payer: BLUE CROSS/BLUE SHIELD | Admitting: Family Medicine

## 2015-05-30 ENCOUNTER — Other Ambulatory Visit (INDEPENDENT_AMBULATORY_CARE_PROVIDER_SITE_OTHER): Payer: BLUE CROSS/BLUE SHIELD

## 2015-05-30 ENCOUNTER — Encounter: Payer: Self-pay | Admitting: Family Medicine

## 2015-05-30 VITALS — BP 122/82 | HR 68 | Ht 66.0 in | Wt 242.0 lb

## 2015-05-30 DIAGNOSIS — M25571 Pain in right ankle and joints of right foot: Secondary | ICD-10-CM

## 2015-05-30 DIAGNOSIS — M179 Osteoarthritis of knee, unspecified: Secondary | ICD-10-CM

## 2015-05-30 DIAGNOSIS — M1710 Unilateral primary osteoarthritis, unspecified knee: Secondary | ICD-10-CM

## 2015-05-30 NOTE — Patient Instructions (Signed)
Good to see you Ice  Is your friend Stay active Injected the knee and the ankle See me again in 4-6 weeks to make sure you are doing well.  Happy holidays!

## 2015-05-30 NOTE — Progress Notes (Signed)
[name] [address] [place], [place] [address] Phone: [phone] Subjective:    I'm seeing this patient by the request  of:  Cathlean Cower, MD   CC: Right ankle pain follow up, left knee follow-up  [id number] Tara [name] is a 64 y.o. female coming in with complaint of  Right ankle pain. Patient was seen previously and I think the overpronation was given her ankle pain. Patient is in custom orthotics and does have underlying osteoarthritic changes. Patient is doing quite well for quite some time and then started having increasing pain again. Patient states that the pain is mostly in the ankle mortise again. Starting to affect her daily activities and walking. Denies any worsening swelling or any new symptoms such as numbness or weakness.  Left knee pain.  Patient has been found to have severe arthritic changes of the knees bilaterally left greater than right. Seems to be mostly the medial compartment. Patient was doing very well and started having increasing pain. Patient did go on a cruise and do a significant amount walking. Patient states since then it seems to have exacerbated the ankle and the knee. No increasing instability noted.  Past medical history, social, surgical and family history all reviewed in electronic medical record.   Review of Systems: No headache, visual changes, nausea, vomiting, diarrhea, constipation, dizziness, abdominal pain, skin rash, fevers, chills, night sweats, weight loss, swollen lymph nodes, body aches, joint swelling, muscle aches, chest pain, shortness of breath, mood changes.   Objective Blood pressure 122/82, pulse 68, height 5\' 6"  (1.676 m), weight 242 lb (109.77 kg), SpO2 99 %.  General: No apparent distress alert and oriented x3 mood and affect normal, dressed appropriately.  HEENT: Pupils equal, extraocular movements intact  Respiratory: Patient's speak in full sentences and does not appear short of breath    Cardiovascular: No lower extremity edema, non tender, no erythema  Skin: Warm dry intact with no signs of infection or rash on extremities or on axial skeleton.  Abdomen: Soft nontender  Neuro: Cranial nerves II through XII are intact, neurovascularly intact in all extremities with 2+ DTRs and 2+ pulses.  Lymph: No lymphadenopathy of posterior or anterior cervical chain or axillae bilaterally.  Gait normal with good balance and coordination.  MSK:  Non tender with full range of motion and good stability and symmetric strength and tone of shoulders, elbows, wrist, hip, bilaterally.   Ankle: right Significant bony changes of the medial and lateral malleolus still present Range of motion is full in all directions. Strength is 5/5 in all directions. Stable lateral and medial ligaments; squeeze test and kleiger test unremarkable; Nontender over the talar dome which is an improvement No pain at base of 5th MT; No tenderness over cuboid; No tenderness over N spot or navicular prominence No tenderness on posterior aspects of lateral and medial malleolus Only minimal tenderness over the peroneal tendon still remaining. Negative tarsal tunnel tinel's Able to walk 4 steps.   Right Knee exam shows the patient does have varus deformity of the knee. Significant instability even with walking. Antalgic gait noted. Tender to palpation over the medial joint line. Neurovascular intact distally. Contralateral knee has arthritic changes noted as well.  Foot exam shows the patient does have severe over pronation of the hindfoot the patient does work walk in a supinated state. Patient also has significant splaying between the first and second toes with breakdown of the transverse arch. Mild hallux rigidus noted  Procedure: Real-time Ultrasound Guided Injection of rightankle mortise Device: GE Logiq E  Ultrasound guided injection is preferred based studies that show increased duration, increased effect,  greater accuracy, decreased procedural pain, increased response rate, and decreased cost with ultrasound guided versus blind injection.  Verbal informed consent obtained.  Time-out conducted.  Noted no overlying erythema, induration, or other signs of local infection.  Skin prepped in a sterile fashion.  Local anesthesia: Topical Ethyl chloride.  With sterile technique and under real time ultrasound guidance: With a 21-gauge 2 inch needle patient was injected with a total of 0.5 mL of 0.5% Marcaine and 0.5 mL of Kenalog 40 mg/dL  Completed without difficulty  Pain immediately resolved suggesting accurate placement of the medication.  Advised to call if fevers/chills, erythema, induration, drainage, or persistent bleeding.  Images permanently stored and available for review in the ultrasound unit.  Impression: Technically successful ultrasound guided injection.   Procedure: Real-time Ultrasound Guided Injection of left knee Device: GE Logiq E  Ultrasound guided injection is preferred based studies that show increased duration, increased effect, greater accuracy, decreased procedural pain, increased response rate, and decreased cost with ultrasound guided versus blind injection.  Verbal informed consent obtained.  Time-out conducted.  Noted no overlying erythema, induration, or other signs of local infection.  Skin prepped in a sterile fashion.  Local anesthesia: Topical Ethyl chloride.  With sterile technique and under real time ultrasound guidance: With a 22-gauge 2 inch needle patient was injected with 4 cc of 0.5% Marcaine and 1 cc of Kenalog 40 mg/dL. This was from a superior lateral approach.  Completed without difficulty  Pain immediately resolved suggesting accurate placement of the medication.  Advised to call if fevers/chills, erythema, induration, drainage, or persistent bleeding.  Images permanently stored and available for review in the ultrasound unit.  Impression:  Technically successful ultrasound guided injection.   Impression and Recommendations:     This case required medical decision making of moderate complexity.

## 2015-05-30 NOTE — Assessment & Plan Note (Signed)
Severe arthritis of the ankle. Patient was given another injection today. Patient tolerated the procedure very well. We will continue to monitor. Unable to do certain medications secondary to her comorbidities. Patient will come back and see me again in 4 weeks for further evaluation and treatment. Continue custom orthotics.

## 2015-05-30 NOTE — Progress Notes (Signed)
Pre visit review using our clinic review tool, if applicable. No additional management support is needed unless otherwise documented below in the visit note. 

## 2015-05-30 NOTE — Assessment & Plan Note (Signed)
Patient given injection today and tolerated the procedure well. Secondary to patient's body habitus we did use the ultrasound. Patient tolerated the procedure well. Encourage her to continue with the conservative therapy. If any worsening symptoms within the next 3 months patient would be a candidate for viscous supplementation. She would have failed all other conservative therapy.

## 2015-06-27 ENCOUNTER — Ambulatory Visit (INDEPENDENT_AMBULATORY_CARE_PROVIDER_SITE_OTHER): Payer: BLUE CROSS/BLUE SHIELD | Admitting: Family Medicine

## 2015-06-27 ENCOUNTER — Encounter: Payer: Self-pay | Admitting: Family Medicine

## 2015-06-27 VITALS — BP 128/84 | HR 70 | Ht 66.0 in | Wt 242.0 lb

## 2015-06-27 DIAGNOSIS — M25571 Pain in right ankle and joints of right foot: Secondary | ICD-10-CM

## 2015-06-27 DIAGNOSIS — M179 Osteoarthritis of knee, unspecified: Secondary | ICD-10-CM

## 2015-06-27 DIAGNOSIS — M1710 Unilateral primary osteoarthritis, unspecified knee: Secondary | ICD-10-CM

## 2015-06-27 NOTE — Assessment & Plan Note (Signed)
Improved overall continue to monitor. May need reinjection at a later date.

## 2015-06-27 NOTE — Patient Instructions (Addendum)
Good to see you Happy holidays!  You are doing great See me in 3 months to check in on the knees and possible injection.

## 2015-06-27 NOTE — Progress Notes (Signed)
Pre visit review using our clinic review tool, if applicable. No additional management support is needed unless otherwise documented below in the visit note. 

## 2015-06-27 NOTE — Progress Notes (Signed)
[name] [address], [place] [address] Phone: [phone] Subjective:    I'm seeing this patient by the request  of:  Cathlean Cower, MD   CC: Right ankle pain follow up, left knee follow-up  [id number] Tara [name] is a 64 y.o. female coming in with complaint of  Right ankle pain. Patient was seen previously and I think the overpronation was given her ankle pain. Patient is in custom orthotics and does have underlying osteoarthritic changes. Patient was having more of an exacerbation was given an injection. Patient does have a history of sarcoidosis to avoid any significant long-term medications. Patient states significantly improved at this time. Not having as much pain. Very happy with the results.  Bilateral knee pain.  Patient has been found to have severe arthritic changes of the knees bilaterally left greater than right. Seems to be mostly the medial compartment. Patient was given an injection at last exam and was to do conservative therapy including home exercises and icing. Patient states overall patient is doing relatively well. States that the corticosteroid injection has decreased the pain by 90%. Not having any significant instability. Pain is not stopping her from any daily activities. Patient is feeling curative measure would be a knee replacement which patient wants to avoid. Has not had viscous supplementation.  Past medical history, social, surgical and family history all reviewed in electronic medical record.   Review of Systems: No headache, visual changes, nausea, vomiting, diarrhea, constipation, dizziness, abdominal pain, skin rash, fevers, chills, night sweats, weight loss, swollen lymph nodes, body aches, joint swelling, muscle aches, chest pain, shortness of breath, mood changes.   Objective Blood pressure 128/84, pulse 70, height 5\' 6"  (1.676 m), weight 242 lb (109.77 kg), SpO2 98 %.  General: No apparent distress alert  and oriented x3 mood and affect normal, dressed appropriately.  HEENT: Pupils equal, extraocular movements intact  Respiratory: Patient's speak in full sentences and does not appear short of breath  Cardiovascular: No lower extremity edema, non tender, no erythema  Skin: Warm dry intact with no signs of infection or rash on extremities or on axial skeleton.  Abdomen: Soft nontender  Neuro: Cranial nerves II through XII are intact, neurovascularly intact in all extremities with 2+ DTRs and 2+ pulses.  Lymph: No lymphadenopathy of posterior or anterior cervical chain or axillae bilaterally.  Gait normal with good balance and coordination.  MSK:  Non tender with full range of motion and good stability and symmetric strength and tone of shoulders, elbows, wrist, hip, bilaterally.   Ankle: right Significant bony changes of the medial and lateral malleolus still present Range of motion is full in all directions. Strength is 5/5 in all directions. Stable lateral and medial ligaments; squeeze test and kleiger test unremarkable; Nontender over the talar dome which is an improvement No pain at base of 5th MT; No tenderness over cuboid; No tenderness over N spot or navicular prominence No tenderness on posterior aspects of lateral and medial malleolus Nontender over the peroneal tendon Negative tarsal tunnel tinel's Able to walk 4 steps.   Bilateral Knee exam shows the patient does have varus deformity of the knee. Significant instability even with walking. Antalgic gait noted. Significant decrease in tenderness over the medial joint line bilaterally Contralateral knee has arthritic changes noted as well.  Foot exam shows the patient does have severe over pronation of the hindfoot the patient does work walk in a supinated state. Patient also has significant  splaying between the first and second toes with breakdown of the transverse arch. Mild hallux rigidus noted     Impression and  Recommendations:     This case required medical decision making of moderate complexity.

## 2015-06-27 NOTE — Assessment & Plan Note (Signed)
Discussed with patient at great length. Patient has any worsening symptoms we will consider doing another injection. Patient can have this every 3 months. She would be a candidate for viscous supplementation. Patient knows that the prognosis though is fairly poor. Patient will need surgery at some point but with patient's history of sarcoidosis patient wants to avoid any surgical intervention secondary to her being high risk. Patient will come back in 2 months for further evaluation and treatment.

## 2015-07-09 ENCOUNTER — Encounter: Payer: Self-pay | Admitting: Medical

## 2015-07-09 ENCOUNTER — Ambulatory Visit (INDEPENDENT_AMBULATORY_CARE_PROVIDER_SITE_OTHER): Payer: BLUE CROSS/BLUE SHIELD | Admitting: Medical

## 2015-07-09 VITALS — BP 128/80 | HR 69 | Temp 97.8°F | Ht 66.0 in | Wt 245.0 lb

## 2015-07-09 DIAGNOSIS — M791 Myalgia: Secondary | ICD-10-CM

## 2015-07-09 DIAGNOSIS — R062 Wheezing: Secondary | ICD-10-CM | POA: Diagnosis not present

## 2015-07-09 DIAGNOSIS — J209 Acute bronchitis, unspecified: Secondary | ICD-10-CM

## 2015-07-09 DIAGNOSIS — IMO0001 Reserved for inherently not codable concepts without codable children: Secondary | ICD-10-CM

## 2015-07-09 DIAGNOSIS — R0981 Nasal congestion: Secondary | ICD-10-CM

## 2015-07-09 DIAGNOSIS — M609 Myositis, unspecified: Secondary | ICD-10-CM

## 2015-07-09 MED ORDER — AZITHROMYCIN 250 MG PO TABS: ORAL_TABLET | ORAL | Status: DC

## 2015-07-09 MED ORDER — BENZONATATE 100 MG PO CAPS: 100.0000 mg | ORAL_CAPSULE | Freq: Three times a day (TID) | ORAL | Status: DC | PRN

## 2015-07-09 MED ORDER — BECLOMETHASONE DIPROPIONATE 40 MCG/ACT IN AERS: 2.0000 | INHALATION_SPRAY | Freq: Two times a day (BID) | RESPIRATORY_TRACT | Status: DC

## 2015-07-09 MED ORDER — FLUTICASONE PROPIONATE 50 MCG/ACT NA SUSP: 2.0000 | Freq: Every day | NASAL | Status: DC

## 2015-07-09 MED ORDER — ALBUTEROL SULFATE HFA 108 (90 BASE) MCG/ACT IN AERS: 2.0000 | INHALATION_SPRAY | Freq: Four times a day (QID) | RESPIRATORY_TRACT | Status: DC | PRN

## 2015-07-09 NOTE — Progress Notes (Signed)
Subjective:    Patient ID: Tara [name], female    DOB: 1951-01-28, 64 y.o.   MRN: [id number]  HPI   Pt in feeling sick for one week. Pt states on past Saturday had diffuse body aches, cough, fatigued and some chills. The severe soreness 1 wk ago went away. Some nasal congestion last night and she is blowing mucous from her nose. Pt has some chest congestion. Pt has cough but not productive.   Review of Systems  Constitutional: Negative for fever, chills and fatigue.       Pt states on first day of illness one week ago felt hot and sweaty.  HENT: Positive for congestion and sore throat. Negative for ear pain, postnasal drip and sinus pressure.        Very faint occasional st.  Respiratory: Positive for cough. Negative for chest tightness, shortness of breath and wheezing.   Cardiovascular: Negative for chest pain and palpitations.  Gastrointestinal: Negative for abdominal pain.  Musculoskeletal: Negative for back pain.       Faint aches not but much less than a week ago.  Neurological: Negative for dizziness and headaches.  Hematological: Negative for adenopathy. Does not bruise/bleed easily.  Psychiatric/Behavioral: Negative for behavioral problems and confusion.     Past Medical History  Diagnosis Date  . ANKLE PAIN, LEFT 04/01/2008  . ANXIETY 04/17/2007  . COLONIC POLYPS, HX OF 08/01/2007  . CONTUSIONS, MULTIPLE 04/01/2009  . DEPRESSION 04/17/2007  . DIZZINESS 08/01/2007  . DYSPNEA 08/01/2007  . Enlargement of lymph nodes 08/13/2007  . GLUCOSE INTOLERANCE 08/01/2007  . HYPERLIPIDEMIA 08/01/2007  . HYPERSOMNIA 07/28/2008  . HYPERTENSION 04/17/2007  . JOINT EFFUSION, LEFT KNEE 06/02/2010  . Loose body in knee 04/01/2009  . Morbid obesity ([place]) 04/20/2007  . OTHER DISEASES OF LUNG NOT ELSEWHERE CLASSIFIED 08/01/2007  . Pain in joint, lower leg 04/01/2009  . PERIPHERAL EDEMA 04/21/2009  . Sarcoidosis ([place]) 09/25/2007  . SHOULDER PAIN, LEFT 04/01/2009  . Impaired glucose tolerance  03/23/2011  . Migraines     "stopped 3-4 yr ago" (07/23/2012)  . 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 Chronic kidney disease     Social History   Social History  . Marital Status: Widowed    Spouse Name: N/A  . Number of Children: N/A  . Years of Education: N/A   Occupational History  . financial analyst    Social History Main Topics  . Smoking status: Never Smoker   . Smokeless tobacco: Never Used  . Alcohol Use: 4.2 oz/week    7 Standard drinks or equivalent per week  . Drug Use: No  . Sexual Activity: No     Comment: 1st intercourse 9 yo-1 partner   Other Topics Concern  . Not on file   Social History Narrative   Lives with 4 cats    Past Surgical History  Procedure Laterality Date  . Myomectomy  1994  . Fracture surgery  ?02/1997    "left upper arm; put rod in" (07/23/2012)  . Refractive surgery  08/1998    "both eyes" (07/23/2012)  . Combined mediastinoscopy and bronchoscopy  08/2007  . Gum surgery  2000-?2009    "several ORs; soft tissue graft; took material from roof of mouth" (07/23/2012  . Knee arthroscopy  03/2004; 06/2009    "right; left" Dr. Theda Sers  . Lymph node biopsy  ~ 2009    "for sarcoidosis; don't know exactly which nodes" (07/23/2102)  . Combined mediastinoscopy and bronchoscopy  2009  .  Breast surgery      Biopsy benign    Family History  Problem Relation Age of Onset  . Heart disease Father 105  . Hypertension Father   . Diabetes Mother   . Hypertension Mother   . Stroke Mother   . Hypertension Sister   . Asthma Sister   . Colon cancer Neg Hx     Allergies  Allergen Reactions  . Floxin [Ocuflox]     shaky  . Fluoxetine Hcl     Suicidal thoughts  . Sulfonamide Derivatives Rash    Current Outpatient Prescriptions on File Prior to Visit  Medication Sig Dispense Refill  . amphetamine-dextroamphetamine (ADDERALL) 30 MG tablet Take 30 mg by mouth 2 (two) times daily.    Marland Kitchen aspirin 81 MG tablet Take 81 mg by mouth daily.      Marland Kitchen atenolol  (TENORMIN) 50 MG tablet Take 1 tablet (50 mg total) by mouth at bedtime. 90 tablet 3  . calcitonin, salmon, (MIACALCIN/FORTICAL) 200 UNIT/ACT nasal spray Place 1 spray into alternate nostrils.  2  . clorazepate (TRANXENE) 7.5 MG tablet Take 7.5 mg by mouth QID.     Marland Kitchen diltiazem (CARTIA XT) 240 MG 24 hr capsule Take 1 capsule (240 mg total) by mouth daily. 90 capsule 3  . ferrous sulfate 325 (65 FE) MG tablet Take 325 mg by mouth daily with breakfast.    . glucosamine-chondroitin 500-400 MG tablet Take 1 tablet by mouth 3 (three) times daily.    . Multiple Vitamin (MULTIVITAMIN) tablet Take 1 tablet by mouth daily.    Marland Kitchen PRISTIQ 100 MG 24 hr tablet Take 1 tablet by mouth daily.  5  . torsemide (DEMADEX) 20 MG tablet Take 1 tablet (20 mg total) by mouth daily. 90 tablet 3  . valsartan (DIOVAN) 320 MG tablet Take 1 tablet (320 mg total) by mouth daily. 90 tablet 3  . zaleplon (SONATA) 10 MG capsule Take 10 mg by mouth at bedtime.     No current facility-administered medications on file prior to visit.    BP 128/80 mmHg  Pulse 69  Temp(Src) 97.8 F (36.6 C) (Oral)  Ht 5\' 6"  (1.676 m)  Wt 245 lb (111.131 kg)  BMI 39.56 kg/m2  SpO2 99%       Objective:   Physical Exam  General  Mental Status - Alert. General Appearance - Well groomed. Not in acute distress.  Skin Rashes- No Rashes.  HEENT Head- Normal. Ear Auditory Canal - Left- Normal. Right - Normal.Tympanic Membrane- Left- Normal. Right- Normal. Eye Sclera/Conjunctiva- Left- Normal. Right- Normal. Nose & Sinuses Nasal Mucosa- Left-  Boggy and Congested. Right-  Boggy and  Congested.Bilateral no  maxillary and no  frontal sinus pressure. Mouth & Throat Lips: Upper Lip- Normal: no dryness, cracking, pallor, cyanosis, or vesicular eruption. Lower Lip-Normal: no dryness, cracking, pallor, cyanosis or vesicular eruption. Buccal Mucosa- Bilateral- No Aphthous ulcers. Oropharynx- No Discharge or Erythema. Tonsils: Characteristics-  Bilateral- No Erythema or Congestion. Size/Enlargement- Bilateral- No enlargement. Discharge- bilateral-None.  Neck Neck- Supple. No Masses.   Chest and Lung Exam Auscultation: Breath Sounds:- even and unlabored. Scattered mild wheezing.faint upper lobe rhonchi.  Cardiovascular Auscultation:Rythm- Regular, rate and rhythm. Murmurs & Other Heart Sounds:Ausculatation of the heart reveal- No Murmurs.  Lymphatic Head & Neck General Head & Neck Lymphatics: Bilateral: Description- No Localized lymphadenopathy.       Assessment & Plan:  For bronchitis rx azithromycin.  For cough rx benzonatate.  For wheezing rx qvar and albuterol.  For nasal congestion rx flonase.  Your flu test was negative. But your symptoms one week ago may have been the flu. You are past the tx time window. But presently may have secondary infection post flu.  Follow up in 7 days or as needed

## 2015-07-09 NOTE — Progress Notes (Signed)
Pre visit review using our clinic review tool, if applicable. No additional management support is needed unless otherwise documented below in the visit note. 

## 2015-07-09 NOTE — Patient Instructions (Addendum)
For bronchitis rx azithromycin.  For cough rx benzonatate.  For wheezing rx qvar and albuterol.  For nasal congestion rx flonase.  Your flu test was negative. But your symptoms one week ago may have been the flu. You are past the tx time window. But presently may have secondary infection post flu.  Follow up in 7 days or as needed

## 2015-07-19 ENCOUNTER — Ambulatory Visit (INDEPENDENT_AMBULATORY_CARE_PROVIDER_SITE_OTHER): Payer: BLUE CROSS/BLUE SHIELD | Admitting: Internal Medicine

## 2015-07-19 ENCOUNTER — Encounter: Payer: Self-pay | Admitting: Internal Medicine

## 2015-07-19 VITALS — BP 120/86 | HR 75 | Temp 97.6°F | Ht 66.0 in | Wt 242.0 lb

## 2015-07-19 DIAGNOSIS — I10 Essential (primary) hypertension: Secondary | ICD-10-CM

## 2015-07-19 DIAGNOSIS — R059 Cough, unspecified: Secondary | ICD-10-CM

## 2015-07-19 DIAGNOSIS — R05 Cough: Secondary | ICD-10-CM

## 2015-07-19 NOTE — Patient Instructions (Signed)
OK to stop the Qvar  Please continue all other medications as before, and refills have been done if requested.  Please have the pharmacy call with any other refills you may need.  Please continue your efforts at being more active, low cholesterol diet, and weight control.  Please keep your appointments with your specialists as you may have planned

## 2015-07-19 NOTE — Progress Notes (Signed)
Subjective:    Patient ID: Tara [name], female    DOB: [birth year], 65 y.o.   MRN: [id number]  HPI  Here to f/u recent illness, no longer c/o ST, HA, general weakness and malaise, with prod cough greenish sputum, and Pt denies chest pain, increased sob or doe, wheezing, orthopnea, PND, increased LE swelling, palpitations, dizziness or syncope. Pt denies new neurological symptoms such as new headache, or facial or extremity weakness or numbness   Pt denies polydipsia, polyuria Past Medical History  Diagnosis Date  . ANKLE PAIN, LEFT 04/01/2008  . ANXIETY 04/17/2007  . COLONIC POLYPS, HX OF 08/01/2007  . CONTUSIONS, MULTIPLE 04/01/2009  . DEPRESSION 04/17/2007  . DIZZINESS 08/01/2007  . DYSPNEA 08/01/2007  . Enlargement of lymph nodes 08/13/2007  . GLUCOSE INTOLERANCE 08/01/2007  . HYPERLIPIDEMIA 08/01/2007  . HYPERSOMNIA 07/28/2008  . HYPERTENSION 04/17/2007  . JOINT EFFUSION, LEFT KNEE 06/02/2010  . Loose body in knee 04/01/2009  . Morbid obesity ([place]) 04/20/2007  . OTHER DISEASES OF LUNG NOT ELSEWHERE CLASSIFIED 08/01/2007  . Pain in joint, lower leg 04/01/2009  . PERIPHERAL EDEMA 04/21/2009  . Sarcoidosis ([place]) 09/25/2007  . SHOULDER PAIN, LEFT 04/01/2009  . Impaired glucose tolerance 03/23/2011  . Migraines     "stopped 3-4 yr ago" (07/23/2012)  . 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 Chronic kidney disease    Past Surgical History  Procedure Laterality Date  . Myomectomy  1994  . Fracture surgery  ?02/1997    "left upper arm; put rod in" (07/23/2012)  . Refractive surgery  08/1998    "both eyes" (07/23/2012)  . Combined mediastinoscopy and bronchoscopy  08/2007  . Gum surgery  2000-?2009    "several ORs; soft tissue graft; took material from roof of mouth" (07/23/2012  . Knee arthroscopy  03/2004; 06/2009    "right; left" Dr. Theda Sers  . Lymph node biopsy  ~ 2009    "for sarcoidosis; don't know exactly which nodes" (07/23/2102)  . Combined mediastinoscopy and bronchoscopy  2009  . Breast  surgery      Biopsy benign    reports that she has never smoked. She has never used smokeless tobacco. She reports that she drinks about 4.2 oz of alcohol per week. She reports that she does not use illicit drugs. family history includes Asthma in her sister; Diabetes in her mother; Heart disease (age of onset: 19) in her father; Hypertension in her father, mother, and sister; Stroke in her mother. There is no history of Colon cancer. Allergies  Allergen Reactions  . Floxin [Ocuflox]     shaky  . Fluoxetine Hcl     Suicidal thoughts  . Sulfonamide Derivatives Rash   Current Outpatient Prescriptions on File Prior to Visit  Medication Sig Dispense Refill  . albuterol (PROVENTIL HFA;VENTOLIN HFA) 108 (90 BASE) MCG/ACT inhaler Inhale 2 puffs into the lungs every 6 (six) hours as needed for wheezing or shortness of breath. 1 Inhaler 0  . amphetamine-dextroamphetamine (ADDERALL) 30 MG tablet Take 30 mg by mouth 2 (two) times daily.    Marland Kitchen aspirin 81 MG tablet Take 81 mg by mouth daily.      Marland Kitchen atenolol (TENORMIN) 50 MG tablet Take 1 tablet (50 mg total) by mouth at bedtime. 90 tablet 3  . calcitonin, salmon, (MIACALCIN/FORTICAL) 200 UNIT/ACT nasal spray Place 1 spray into alternate nostrils.  2  . clorazepate (TRANXENE) 7.5 MG tablet Take 7.5 mg by mouth QID.     Marland Kitchen diltiazem (CARTIA XT) 240 MG  24 hr capsule Take 1 capsule (240 mg total) by mouth daily. 90 capsule 3  . ferrous sulfate 325 (65 FE) MG tablet Take 325 mg by mouth daily with breakfast.    . fluticasone (FLONASE) 50 MCG/ACT nasal spray Place 2 sprays into both nostrils daily. 16 g 1  . glucosamine-chondroitin 500-400 MG tablet Take 1 tablet by mouth 3 (three) times daily.    . Multiple Vitamin (MULTIVITAMIN) tablet Take 1 tablet by mouth daily.    Marland Kitchen PRISTIQ 100 MG 24 hr tablet Take 1 tablet by mouth daily.  5  . torsemide (DEMADEX) 20 MG tablet Take 1 tablet (20 mg total) by mouth daily. 90 tablet 3  . valsartan (DIOVAN) 320 MG tablet  Take 1 tablet (320 mg total) by mouth daily. 90 tablet 3  . zaleplon (SONATA) 10 MG capsule Take 10 mg by mouth at bedtime.    Marland Kitchen azithromycin (ZITHROMAX) 250 MG tablet Take 2 tablets by mouth on day 1, followed by 1 tablet by mouth daily for 4 days. (Patient not taking: Reported on 07/19/2015) 6 tablet 0  . benzonatate (TESSALON) 100 MG capsule Take 1 capsule (100 mg total) by mouth 3 (three) times daily as needed for cough. (Patient not taking: Reported on 07/19/2015) 21 capsule 0   No current facility-administered medications on file prior to visit.   Review of Systems  All otherwise neg per pt     Objective:   Physical Exam BP 120/86 mmHg  Pulse 75  Temp(Src) 97.6 F (36.4 C) (Oral)  Ht 5\' 6"  (1.676 m)  Wt 242 lb (109.77 kg)  BMI 39.08 kg/m2  SpO2 98% VS noted, not ill appearing Constitutional: Pt appears in no significant distress HENT: Head: NCAT.  Right Ear: External ear normal.  Left Ear: External ear normal.  Eyes: . Pupils are equal, round, and reactive to light. Conjunctivae and EOM are normal Neck: Normal range of motion. Neck supple.  Cardiovascular: Normal rate and regular rhythm.   Pulmonary/Chest: Effort normal and breath sounds without rales or wheezing.  Neurological: Pt is alert. Not confused , motor grossly intact Skin: Skin is warm. No rash, no LE edema Psychiatric: Pt behavior is normal. No agitation.     Assessment & Plan:

## 2015-07-19 NOTE — Progress Notes (Signed)
Pre visit review using our clinic review tool, if applicable. No additional management support is needed unless otherwise documented below in the visit note. 

## 2015-07-20 DIAGNOSIS — R05 Cough: Secondary | ICD-10-CM | POA: Insufficient documentation

## 2015-07-20 DIAGNOSIS — R059 Cough, unspecified: Secondary | ICD-10-CM | POA: Insufficient documentation

## 2015-07-20 NOTE — Assessment & Plan Note (Addendum)
With recent infectious illness now resolved, reassured,  to f/u any worsening symptoms or concerns, ok to stop the qvar

## 2015-07-20 NOTE — Assessment & Plan Note (Signed)
stable overall by history and exam, recent data reviewed with pt, and pt to continue medical treatment as before,  to f/u any worsening symptoms or concerns BP Readings from Last 3 Encounters:  07/19/15 120/86  07/09/15 128/80  06/27/15 128/84

## 2015-07-27 ENCOUNTER — Other Ambulatory Visit: Payer: Self-pay | Admitting: Internal Medicine

## 2015-09-12 ENCOUNTER — Other Ambulatory Visit: Payer: Self-pay | Admitting: Internal Medicine

## 2015-09-27 ENCOUNTER — Other Ambulatory Visit (INDEPENDENT_AMBULATORY_CARE_PROVIDER_SITE_OTHER): Payer: BLUE CROSS/BLUE SHIELD

## 2015-09-27 ENCOUNTER — Ambulatory Visit (INDEPENDENT_AMBULATORY_CARE_PROVIDER_SITE_OTHER): Payer: BLUE CROSS/BLUE SHIELD | Admitting: Internal Medicine

## 2015-09-27 ENCOUNTER — Encounter: Payer: Self-pay | Admitting: Family Medicine

## 2015-09-27 ENCOUNTER — Encounter: Payer: Self-pay | Admitting: Internal Medicine

## 2015-09-27 ENCOUNTER — Ambulatory Visit (INDEPENDENT_AMBULATORY_CARE_PROVIDER_SITE_OTHER): Payer: BLUE CROSS/BLUE SHIELD | Admitting: Family Medicine

## 2015-09-27 VITALS — BP 120/74 | HR 74 | Temp 98.4°F | Resp 20 | Wt 239.0 lb

## 2015-09-27 VITALS — BP 122/82 | HR 71 | Ht 66.0 in | Wt 234.0 lb

## 2015-09-27 DIAGNOSIS — T380X5A Adverse effect of glucocorticoids and synthetic analogues, initial encounter: Secondary | ICD-10-CM

## 2015-09-27 DIAGNOSIS — I10 Essential (primary) hypertension: Secondary | ICD-10-CM

## 2015-09-27 DIAGNOSIS — Z0000 Encounter for general adult medical examination without abnormal findings: Secondary | ICD-10-CM

## 2015-09-27 DIAGNOSIS — M216X1 Other acquired deformities of right foot: Secondary | ICD-10-CM

## 2015-09-27 DIAGNOSIS — E099 Drug or chemical induced diabetes mellitus without complications: Secondary | ICD-10-CM | POA: Diagnosis not present

## 2015-09-27 DIAGNOSIS — F329 Major depressive disorder, single episode, unspecified: Secondary | ICD-10-CM

## 2015-09-27 DIAGNOSIS — M179 Osteoarthritis of knee, unspecified: Secondary | ICD-10-CM

## 2015-09-27 DIAGNOSIS — R7989 Other specified abnormal findings of blood chemistry: Secondary | ICD-10-CM | POA: Diagnosis not present

## 2015-09-27 DIAGNOSIS — M1710 Unilateral primary osteoarthritis, unspecified knee: Secondary | ICD-10-CM

## 2015-09-27 DIAGNOSIS — F32A Depression, unspecified: Secondary | ICD-10-CM

## 2015-09-27 LAB — URINALYSIS, ROUTINE W REFLEX MICROSCOPIC
BILIRUBIN URINE: NEGATIVE
HGB URINE DIPSTICK: NEGATIVE
KETONES UR: NEGATIVE
NITRITE: NEGATIVE
PH: 6.5 (ref 5.0–8.0)
RBC / HPF: NONE SEEN (ref 0–?)
Specific Gravity, Urine: 1.01 (ref 1.000–1.030)
Total Protein, Urine: NEGATIVE
Urine Glucose: NEGATIVE
Urobilinogen, UA: 0.2 (ref 0.0–1.0)

## 2015-09-27 LAB — LIPID PANEL
CHOLESTEROL: 148 mg/dL (ref 0–200)
HDL: 38.6 mg/dL — AB (ref >39.00)
NonHDL: 109.73
TRIGLYCERIDES: 255 mg/dL — AB (ref 0.0–149.0)
Total CHOL/HDL Ratio: 4
VLDL: 51 mg/dL — AB (ref 0.0–40.0)

## 2015-09-27 LAB — BASIC METABOLIC PANEL
BUN: 18 mg/dL (ref 6–23)
CO2: 26 meq/L (ref 19–32)
Calcium: 10.6 mg/dL — ABNORMAL HIGH (ref 8.4–10.5)
Chloride: 100 mEq/L (ref 96–112)
Creatinine, Ser: 1.81 mg/dL — ABNORMAL HIGH (ref 0.40–1.20)
GFR: 30.03 mL/min — AB (ref >60.00)
GLUCOSE: 139 mg/dL — AB (ref 70–99)
POTASSIUM: 3.3 meq/L — AB (ref 3.5–5.1)
SODIUM: 137 meq/L (ref 135–145)

## 2015-09-27 LAB — CBC WITH DIFFERENTIAL/PLATELET
Basophils Absolute: 0 10*3/uL (ref 0.0–0.1)
Basophils Relative: 0.1 % (ref 0.0–3.0)
EOS PCT: 0 % (ref 0.0–5.0)
Eosinophils Absolute: 0 10*3/uL (ref 0.0–0.7)
HCT: 39.6 % (ref 36.0–46.0)
Hemoglobin: 13.3 g/dL (ref 12.0–15.0)
LYMPHS ABS: 1.3 10*3/uL (ref 0.7–4.0)
Lymphocytes Relative: 18.1 % (ref 12.0–46.0)
MCHC: 33.5 g/dL (ref 30.0–36.0)
MCV: 80 fl (ref 78.0–100.0)
MONO ABS: 0.8 10*3/uL (ref 0.1–1.0)
Monocytes Relative: 11.7 % (ref 3.0–12.0)
NEUTROS ABS: 4.9 10*3/uL (ref 1.4–7.7)
NEUTROS PCT: 70.1 % (ref 43.0–77.0)
PLATELETS: 236 10*3/uL (ref 150.0–400.0)
RBC: 4.95 Mil/uL (ref 3.87–5.11)
RDW: 14.6 % (ref 11.5–15.5)
WBC: 7 10*3/uL (ref 4.0–10.5)

## 2015-09-27 LAB — MICROALBUMIN / CREATININE URINE RATIO
Creatinine,U: 68 mg/dL
Microalb Creat Ratio: 2.6 mg/g (ref 0.0–30.0)
Microalb, Ur: 1.8 mg/dL (ref 0.0–1.9)

## 2015-09-27 LAB — HEPATIC FUNCTION PANEL
ALBUMIN: 4.1 g/dL (ref 3.5–5.2)
ALK PHOS: 108 U/L (ref 39–117)
ALT: 23 U/L (ref 0–35)
AST: 21 U/L (ref 0–37)
Bilirubin, Direct: 0 mg/dL (ref 0.0–0.3)
TOTAL PROTEIN: 7.8 g/dL (ref 6.0–8.3)
Total Bilirubin: 0.5 mg/dL (ref 0.2–1.2)

## 2015-09-27 LAB — HEMOGLOBIN A1C: HEMOGLOBIN A1C: 6.6 % — AB (ref 4.6–6.5)

## 2015-09-27 LAB — LDL CHOLESTEROL, DIRECT: Direct LDL: 42 mg/dL

## 2015-09-27 LAB — TSH: TSH: 4.72 u[IU]/mL — AB (ref 0.35–4.50)

## 2015-09-27 NOTE — Patient Instructions (Addendum)
Please continue all other medications as before, and refills have been done if requested.  Please have the pharmacy call with any other refills you may need.  Please continue your efforts at being more active, low cholesterol diet, and weight control.  You are otherwise up to date with prevention measures today.  Please keep your appointments with your specialists as you may have planned  Your lab work was done earlier today  You will be contacted by phone if any changes need to be made immediately.  Otherwise, you will receive a letter about your results with an explanation, but please check with MyChart first.  Please remember to sign up for MyChart if you have not done so, as this will be important to you in the future with finding out test results, communicating by private email, and scheduling acute appointments online when needed.  Please return in 6 months, or sooner if needed, with Lab testing done 3-5 days before

## 2015-09-27 NOTE — Progress Notes (Signed)
Pre visit review using our clinic review tool, if applicable. No additional management support is needed unless otherwise documented below in the visit note. 

## 2015-09-27 NOTE — Assessment & Plan Note (Signed)
Encourage weight loss. 

## 2015-09-27 NOTE — Progress Notes (Signed)
Subjective:    Patient ID: Tara [name], female    DOB: Aug 02, 1950, 65 y.o.   MRN: [id number]  HPI  Here for wellness and f/u;  Overall doing ok;  Pt denies Chest pain, worsening SOB, DOE, wheezing, orthopnea, PND, worsening LE edema, palpitations, dizziness or syncope.  Pt denies neurological change such as new headache, facial or extremity weakness.  Pt denies polydipsia, polyuria, or low sugar symptoms. Pt states overall good compliance with treatment and medications, good tolerability, and has been trying to follow appropriate diet.  Pt denies worsening depressive symptoms, suicidal ideation or panic. No fever, night sweats, wt loss, loss of appetite, or other constitutional symptoms.  Pt states good ability with ADL's, has low fall risk, home safety reviewed and adequate, no other significant changes in hearing or vision, and only occasionally active with exercise. Did have cortisone shot to left knee earlier today, already better pain. May need TKR soon but trying to hold off.  Past Medical History  Diagnosis Date  . ANKLE PAIN, LEFT 04/01/2008  . ANXIETY 04/17/2007  . COLONIC POLYPS, HX OF 08/01/2007  . CONTUSIONS, MULTIPLE 04/01/2009  . DEPRESSION 04/17/2007  . DIZZINESS 08/01/2007  . DYSPNEA 08/01/2007  . Enlargement of lymph nodes 08/13/2007  . GLUCOSE INTOLERANCE 08/01/2007  . HYPERLIPIDEMIA 08/01/2007  . HYPERSOMNIA 07/28/2008  . HYPERTENSION 04/17/2007  . JOINT EFFUSION, LEFT KNEE 06/02/2010  . Loose body in knee 04/01/2009  . Morbid obesity ([place]) 04/20/2007  . OTHER DISEASES OF LUNG NOT ELSEWHERE CLASSIFIED 08/01/2007  . Pain in joint, lower leg 04/01/2009  . PERIPHERAL EDEMA 04/21/2009  . Sarcoidosis ([place]) 09/25/2007  . SHOULDER PAIN, LEFT 04/01/2009  . Impaired glucose tolerance 03/23/2011  . Migraines     "stopped 3-4 yr ago" (07/23/2012)  . 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 Chronic kidney disease    Past Surgical History  Procedure Laterality Date  . Myomectomy  1994  .  Fracture surgery  ?02/1997    "left upper arm; put rod in" (07/23/2012)  . Refractive surgery  08/1998    "both eyes" (07/23/2012)  . Combined mediastinoscopy and bronchoscopy  08/2007  . Gum surgery  2000-?2009    "several ORs; soft tissue graft; took material from roof of mouth" (07/23/2012  . Knee arthroscopy  03/2004; 06/2009    "right; left" Dr. Theda Sers  . Lymph node biopsy  ~ 2009    "for sarcoidosis; don't know exactly which nodes" (07/23/2102)  . Combined mediastinoscopy and bronchoscopy  2009  . Breast surgery      Biopsy benign    reports that she has never smoked. She has never used smokeless tobacco. She reports that she drinks about 4.2 oz of alcohol per week. She reports that she does not use illicit drugs. family history includes Asthma in her sister; Diabetes in her mother; Heart disease (age of onset: 55) in her father; Hypertension in her father, mother, and sister; Stroke in her mother. There is no history of Colon cancer. Allergies  Allergen Reactions  . Floxin [Ocuflox]     shaky  . Fluoxetine Hcl     Suicidal thoughts  . Sulfonamide Derivatives Rash   Current Outpatient Prescriptions on File Prior to Visit  Medication Sig Dispense Refill  . amphetamine-dextroamphetamine (ADDERALL) 30 MG tablet Take 30 mg by mouth 2 (two) times daily.    Marland Kitchen aspirin 81 MG tablet Take 81 mg by mouth daily.      Marland Kitchen atenolol (TENORMIN) 50 MG tablet TAKE 1  TABLET(50 MG) BY MOUTH DAILY 90 tablet 1  . calcitonin, salmon, (MIACALCIN/FORTICAL) 200 UNIT/ACT nasal spray Place 1 spray into alternate nostrils.  2  . clorazepate (TRANXENE) 7.5 MG tablet Take 7.5 mg by mouth QID.     Marland Kitchen diltiazem (CARTIA XT) 240 MG 24 hr capsule Take 1 capsule (240 mg total) by mouth daily. 90 capsule 3  . ferrous sulfate 325 (65 FE) MG tablet Take 325 mg by mouth daily with breakfast.    . glucosamine-chondroitin 500-400 MG tablet Take 1 tablet by mouth 3 (three) times daily.    . Multiple Vitamin (MULTIVITAMIN) tablet  Take 1 tablet by mouth daily.    Marland Kitchen PRISTIQ 100 MG 24 hr tablet Take 1 tablet by mouth daily.  5  . torsemide (DEMADEX) 20 MG tablet Take 1 tablet (20 mg total) by mouth daily. 90 tablet 3  . valsartan (DIOVAN) 320 MG tablet TAKE 1 TABLET BY MOUTH EVERY DAY 90 tablet 0  . zaleplon (SONATA) 10 MG capsule Take 10 mg by mouth at bedtime.     No current facility-administered medications on file prior to visit.    Review of Systems Constitutional: Negative for increased diaphoresis, other activity, appetite or siginficant weight change other than noted HENT: Negative for worsening hearing loss, ear pain, facial swelling, mouth sores and neck stiffness.   Eyes: Negative for other worsening pain, redness or visual disturbance.  Respiratory: Negative for shortness of breath and wheezing  Cardiovascular: Negative for chest pain and palpitations.  Gastrointestinal: Negative for diarrhea, blood in stool, abdominal distention or other pain Genitourinary: Negative for hematuria, flank pain or change in urine volume.  Musculoskeletal: Negative for myalgias or other joint complaints.  Skin: Negative for color change and wound or drainage.  Neurological: Negative for syncope and numbness. other than noted Hematological: Negative for adenopathy. or other swelling Psychiatric/Behavioral: Negative for hallucinations, SI, self-injury, decreased concentration or other worsening agitation.      Objective:   Physical Exam BP 120/74 mmHg  Pulse 74  Temp(Src) 98.4 F (36.9 C) (Oral)  Resp 20  Wt 239 lb (108.41 kg)  SpO2 97% VS noted, morbid obese Constitutional: Pt is oriented to person, place, and time. Appears well-developed and well-nourished, in no significant distress Head: Normocephalic and atraumatic.  Right Ear: External ear normal.  Left Ear: External ear normal.  Nose: Nose normal.  Mouth/Throat: Oropharynx is clear and moist.  Eyes: Conjunctivae and EOM are normal. Pupils are equal, round,  and reactive to light.  Neck: Normal range of motion. Neck supple. No JVD present. No tracheal deviation present or significant neck LA or mass Cardiovascular: Normal rate, regular rhythm, normal heart sounds and intact distal pulses.   Pulmonary/Chest: Effort normal and breath sounds without rales or wheezing  Abdominal: Soft. Bowel sounds are normal. NT. No HSM  Musculoskeletal: Normal range of motion. Exhibits no edema.  Lymphadenopathy:  Has no cervical adenopathy.  Neurological: Pt is alert and oriented to person, place, and time. Pt has normal reflexes. No cranial nerve deficit. Motor grossly intact Skin: Skin is warm and dry. No rash noted.  Psychiatric:  Has normal mood and affect. Behavior is normal.     Assessment & Plan:

## 2015-09-27 NOTE — Progress Notes (Signed)
[name] [address] [place], [place] [address] Phone: [phone] Subjective:    I'm seeing this patient by the request  of:  Cathlean Cower, MD   CC: Left knee follow-up  [id number] Tara [name] is a 65 y.o. female coming in with complaint of  Right ankle pain. Patient states as long as he wears good shoes she is doing relatively well. Denies any giving out on her.  Bilateral knee pain.  Patient has been found to have severe arthritic changes of the knees bilaterally left greater than right. Patient states that her left knee starting to give her more trouble again. States that it seems to be giving her some instability. The wearing the brace on a regular basis. Patient is concerned because it is stopping her from daily activities sometimes. Patient states that she has concern about going upstairs. Was doing very well with her last injection greater than 3 months ago.  Past Medical History  Diagnosis Date  . ANKLE PAIN, LEFT 04/01/2008  . ANXIETY 04/17/2007  . COLONIC POLYPS, HX OF 08/01/2007  . CONTUSIONS, MULTIPLE 04/01/2009  . DEPRESSION 04/17/2007  . DIZZINESS 08/01/2007  . DYSPNEA 08/01/2007  . Enlargement of lymph nodes 08/13/2007  . GLUCOSE INTOLERANCE 08/01/2007  . HYPERLIPIDEMIA 08/01/2007  . HYPERSOMNIA 07/28/2008  . HYPERTENSION 04/17/2007  . JOINT EFFUSION, LEFT KNEE 06/02/2010  . Loose body in knee 04/01/2009  . Morbid obesity ([place]) 04/20/2007  . OTHER DISEASES OF LUNG NOT ELSEWHERE CLASSIFIED 08/01/2007  . Pain in joint, lower leg 04/01/2009  . PERIPHERAL EDEMA 04/21/2009  . Sarcoidosis ([place]) 09/25/2007  . SHOULDER PAIN, LEFT 04/01/2009  . Impaired glucose tolerance 03/23/2011  . Migraines     "stopped 3-4 yr ago" (07/23/2012)  . 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 Chronic kidney disease    Past Surgical History  Procedure Laterality Date  . Myomectomy  1994  . Fracture surgery  ?02/1997    "left upper arm; put rod in" (07/23/2012)  .  Refractive surgery  08/1998    "both eyes" (07/23/2012)  . Combined mediastinoscopy and bronchoscopy  08/2007  . Gum surgery  2000-?2009    "several ORs; soft tissue graft; took material from roof of mouth" (07/23/2012  . Knee arthroscopy  03/2004; 06/2009    "right; left" Dr. Theda Sers  . Lymph node biopsy  ~ 2009    "for sarcoidosis; don't know exactly which nodes" (07/23/2102)  . Combined mediastinoscopy and bronchoscopy  2009  . Breast surgery      Biopsy benign   Social History  Substance Use Topics  . Smoking status: Never Smoker   . Smokeless tobacco: Never Used  . Alcohol Use: 4.2 oz/week    7 Standard drinks or equivalent per week   Allergies  Allergen Reactions  . Floxin [Ocuflox]     shaky  . Fluoxetine Hcl     Suicidal thoughts  . Sulfonamide Derivatives Rash   Family History  Problem Relation Age of Onset  . Heart disease Father 76  . Hypertension Father   . Diabetes Mother   . Hypertension Mother   . Stroke Mother   . Hypertension Sister   . Asthma Sister   . Colon cancer Neg Hx      Past medical history, social, surgical and family history all reviewed in electronic medical record.   Review of Systems: No headache, visual changes, nausea, vomiting, diarrhea, constipation, dizziness, abdominal pain, skin rash, fevers, chills, night sweats, weight  loss, swollen lymph nodes, body aches, joint swelling, muscle aches, chest pain, shortness of breath, mood changes.   Objective Blood pressure 122/82, pulse 71, height 5\' 6"  (1.676 m), weight 234 lb (106.142 kg), SpO2 96 %.  General: No apparent distress alert and oriented x3 mood and affect normal, dressed appropriately.  HEENT: Pupils equal, extraocular movements intact  Respiratory: Patient's speak in full sentences and does not appear short of breath  Cardiovascular: No lower extremity edema, non tender, no erythema  Skin: Warm dry intact with no signs of infection or rash on extremities or on axial skeleton.    Abdomen: Soft nontender  Neuro: Cranial nerves II through XII are intact, neurovascularly intact in all extremities with 2+ DTRs and 2+ pulses.  Lymph: No lymphadenopathy of posterior or anterior cervical chain or axillae bilaterally.  Gait normal with good balance and coordination.  MSK:  Non tender with full range of motion and good stability and symmetric strength and tone of shoulders, elbows, wrist, hip, bilaterally.     Bilateral Knee exam shows the patient does have varus deformity of the knee. Significant instability even with walking. Antalgic gait noted. Significant decrease in tenderness over the medial joint line bilaterally Contralateral knee has arthritic changes noted as well.   After informed written and verbal consent, patient was seated on exam table. Left knee was prepped with alcohol swab and utilizing anterolateral approach, patient's left knee space was injected with 4:1  marcaine 0.5%: Kenalog 40mg /dL. Patient tolerated the procedure well without immediate complications.   Impression and Recommendations:     This case required medical decision making of moderate complexity.

## 2015-09-27 NOTE — Assessment & Plan Note (Signed)
Encourage new shoes.

## 2015-09-27 NOTE — Assessment & Plan Note (Signed)
Patient given another injection today and tolerated the procedure well. We discussed possible individualized bracing and patient declined. We also discussed formal physical therapy which patient declined. Patient knows that at some point she may need of surgical intervention but she would like to wait longer. Patient will continue to stay active. Patient will come back and see me again in 4 weeks. At that time she would be a candidate for viscous supplementation if necessary.

## 2015-09-27 NOTE — Patient Instructions (Signed)
Good to see you  Ice is your friend still  Continue to monitor it and get back on the bike We can inject the knee every 3 months.  Consider going shoe shopping soon.  See me again in 4 weeks if in pain and we can discuss other injection if needed.

## 2015-09-28 NOTE — Assessment & Plan Note (Signed)
stable overall by history and exam, recent data reviewed with pt, and pt to continue medical treatment as before,  to f/u any worsening symptoms or concerns Lab Results  Component Value Date   HGBA1C 6.6* 09/27/2015

## 2015-09-28 NOTE — Assessment & Plan Note (Signed)
stable overall by history and exam, recent data reviewed with pt, and pt to continue medical treatment as before,  to f/u any worsening symptoms or concerns Lab Results  Component Value Date   WBC 7.0 09/27/2015   HGB 13.3 09/27/2015   HCT 39.6 09/27/2015   PLT 236.0 09/27/2015   GLUCOSE 139* 09/27/2015   CHOL 148 09/27/2015   TRIG 255.0* 09/27/2015   HDL 38.60* 09/27/2015   LDLDIRECT 42.0 09/27/2015   LDLCALC 133* 09/22/2013   ALT 23 09/27/2015   AST 21 09/27/2015   NA 137 09/27/2015   K 3.3* 09/27/2015   CL 100 09/27/2015   CREATININE 1.81* 09/27/2015   BUN 18 09/27/2015   CO2 26 09/27/2015   TSH 4.72* 09/27/2015   INR 0.9 09/04/2007   HGBA1C 6.6* 09/27/2015   MICROALBUR 1.8 09/27/2015

## 2015-09-28 NOTE — Assessment & Plan Note (Signed)

## 2015-09-28 NOTE — Assessment & Plan Note (Signed)
stable overall by history and exam, recent data reviewed with pt, and pt to continue medical treatment as before,  to f/u any worsening symptoms or concerns BP Readings from Last 3 Encounters:  09/27/15 120/74  09/27/15 122/82  07/19/15 120/86

## 2015-10-12 ENCOUNTER — Emergency Department (HOSPITAL_COMMUNITY): Payer: BLUE CROSS/BLUE SHIELD

## 2015-10-12 ENCOUNTER — Encounter (HOSPITAL_COMMUNITY): Payer: Self-pay | Admitting: Cardiology

## 2015-10-12 ENCOUNTER — Emergency Department (HOSPITAL_COMMUNITY)
Admission: EM | Admit: 2015-10-12 | Discharge: 2015-10-12 | Disposition: A | Discharge status: Home / Self Care | Payer: BLUE CROSS/BLUE SHIELD | Attending: Emergency Medicine | Admitting: Emergency Medicine

## 2015-10-12 DIAGNOSIS — Y9389 Activity, other specified: Secondary | ICD-10-CM | POA: Insufficient documentation

## 2015-10-12 DIAGNOSIS — Z79899 Other long term (current) drug therapy: Secondary | ICD-10-CM | POA: Insufficient documentation

## 2015-10-12 DIAGNOSIS — S0990XA Unspecified injury of head, initial encounter: Secondary | ICD-10-CM | POA: Insufficient documentation

## 2015-10-12 DIAGNOSIS — N189 Chronic kidney disease, unspecified: Secondary | ICD-10-CM | POA: Diagnosis not present

## 2015-10-12 DIAGNOSIS — Y92410 Unspecified street and highway as the place of occurrence of the external cause: Secondary | ICD-10-CM | POA: Insufficient documentation

## 2015-10-12 DIAGNOSIS — Y998 Other external cause status: Secondary | ICD-10-CM | POA: Insufficient documentation

## 2015-10-12 DIAGNOSIS — Z86010 Personal history of colonic polyps: Secondary | ICD-10-CM | POA: Diagnosis not present

## 2015-10-12 DIAGNOSIS — E785 Hyperlipidemia, unspecified: Secondary | ICD-10-CM | POA: Diagnosis not present

## 2015-10-12 DIAGNOSIS — F419 Anxiety disorder, unspecified: Secondary | ICD-10-CM | POA: Insufficient documentation

## 2015-10-12 DIAGNOSIS — I129 Hypertensive chronic kidney disease with stage 1 through stage 4 chronic kidney disease, or unspecified chronic kidney disease: Secondary | ICD-10-CM | POA: Insufficient documentation

## 2015-10-12 MED ORDER — HYDROCODONE-ACETAMINOPHEN 5-325 MG PO TABS: 1.0000 | ORAL_TABLET | Freq: Four times a day (QID) | ORAL | Status: DC | PRN
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
## 2015-10-12 NOTE — ED Notes (Signed)
Pt ambulated in the hall without difficulty. No complaints noted.

## 2015-10-12 NOTE — Discharge Instructions (Signed)
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
## 2015-10-18 ENCOUNTER — Ambulatory Visit (INDEPENDENT_AMBULATORY_CARE_PROVIDER_SITE_OTHER): Payer: BLUE CROSS/BLUE SHIELD | Admitting: Internal Medicine

## 2015-10-18 ENCOUNTER — Encounter: Payer: Self-pay | Admitting: Internal Medicine

## 2015-10-18 VITALS — BP 140/70 | HR 65 | Temp 98.0°F | Resp 20 | Wt 238.0 lb

## 2015-10-18 DIAGNOSIS — I10 Essential (primary) hypertension: Secondary | ICD-10-CM

## 2015-10-18 DIAGNOSIS — N184 Chronic kidney disease, stage 4 (severe): Secondary | ICD-10-CM | POA: Diagnosis not present

## 2015-10-18 DIAGNOSIS — F411 Generalized anxiety disorder: Secondary | ICD-10-CM | POA: Diagnosis not present

## 2015-10-18 NOTE — Progress Notes (Signed)
Pre visit review using our clinic review tool, if applicable. No additional management support is needed unless otherwise documented below in the visit note. 

## 2015-10-18 NOTE — Patient Instructions (Signed)
Please continue all other medications as before, and refills have been done if requested.  Please have the pharmacy call with any other refills you may need.  Please keep your appointments with your specialists as you may have planned    
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
## 2015-10-19 ENCOUNTER — Other Ambulatory Visit: Payer: Self-pay | Admitting: Internal Medicine

## 2015-10-19 NOTE — Assessment & Plan Note (Signed)
stable overall by history and exam, recent data reviewed with pt, and pt to continue medical treatment as before,  to f/u any worsening symptoms or concerns BP Readings from Last 3 Encounters:  10/18/15 140/70  10/12/15 159/80  09/27/15 120/74

## 2015-10-19 NOTE — Assessment & Plan Note (Signed)
Mild to mod, stable overall by history and exam, recent data reviewed with pt, and pt to continue medical treatment as before,  to f/u any worsening symptoms or concerns Lab Results  Component Value Date   WBC 7.0 09/27/2015   HGB 13.3 09/27/2015   HCT 39.6 09/27/2015   PLT 236.0 09/27/2015   GLUCOSE 139* 09/27/2015   CHOL 148 09/27/2015   TRIG 255.0* 09/27/2015   HDL 38.60* 09/27/2015   LDLDIRECT 42.0 09/27/2015   LDLCALC 133* 09/22/2013   ALT 23 09/27/2015   AST 21 09/27/2015   NA 137 09/27/2015   K 3.3* 09/27/2015   CL 100 09/27/2015   CREATININE 1.81* 09/27/2015   BUN 18 09/27/2015   CO2 26 09/27/2015   TSH 4.72* 09/27/2015   INR 0.9 09/04/2007   HGBA1C 6.6* 09/27/2015   MICROALBUR 1.8 09/27/2015

## 2015-10-19 NOTE — Assessment & Plan Note (Signed)
With bruising and aching noted today, o/w no other new significant injuries, for tylenol prn,  to f/u any worsening symptoms or concerns

## 2015-10-19 NOTE — Assessment & Plan Note (Signed)
stable overall by history and exam, recent data reviewed with pt, and pt to continue medical treatment as before,  to f/u any worsening symptoms or concerns Lab Results  Component Value Date   CREATININE 1.81* 09/27/2015

## 2015-10-25 ENCOUNTER — Encounter: Payer: Self-pay | Admitting: Family Medicine

## 2015-10-25 ENCOUNTER — Ambulatory Visit (INDEPENDENT_AMBULATORY_CARE_PROVIDER_SITE_OTHER): Payer: BLUE CROSS/BLUE SHIELD | Admitting: Family Medicine

## 2015-10-25 VITALS — BP 128/82 | HR 74 | Ht 66.0 in | Wt 238.0 lb

## 2015-10-25 DIAGNOSIS — M1712 Unilateral primary osteoarthritis, left knee: Secondary | ICD-10-CM | POA: Diagnosis not present

## 2015-10-25 DIAGNOSIS — M1710 Unilateral primary osteoarthritis, unspecified knee: Secondary | ICD-10-CM

## 2015-10-25 DIAGNOSIS — M170 Bilateral primary osteoarthritis of knee: Secondary | ICD-10-CM

## 2015-10-25 NOTE — Progress Notes (Signed)
Pre visit review using our clinic review tool, if applicable. No additional management support is needed unless otherwise documented below in the visit note. 

## 2015-10-25 NOTE — Patient Instructions (Signed)
Good to see you  Ice is your friend Arnica lotion 2 times daily can help with the bruising Started synvisc today and I think it will help See you again next week for 2nd in a series of 3 injections.  I am so glad you are all right.
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
## 2015-10-25 NOTE — Assessment & Plan Note (Addendum)
Patient did have significant amount pain. I do think that she will respond somewhat to the viscous supplementation. We discussed icing regimen and continuing conservative therapy. Patient still does not want to wear her brace on a regular basis. Still avoid any surgical intervention. Patient will come back and see me again in 1 week for her second in a series of 3 injections. Spent  25 minutes with patient face-to-face and had greater than 50% of counseling including as described above in assessment and plan.

## 2015-11-01 ENCOUNTER — Ambulatory Visit (INDEPENDENT_AMBULATORY_CARE_PROVIDER_SITE_OTHER): Payer: BLUE CROSS/BLUE SHIELD | Admitting: Family Medicine

## 2015-11-01 ENCOUNTER — Encounter: Payer: Self-pay | Admitting: Family Medicine

## 2015-11-01 VITALS — BP 122/82 | HR 64 | Ht 66.0 in | Wt 238.0 lb

## 2015-11-01 DIAGNOSIS — M1712 Unilateral primary osteoarthritis, left knee: Secondary | ICD-10-CM | POA: Diagnosis not present

## 2015-11-01 DIAGNOSIS — M1710 Unilateral primary osteoarthritis, unspecified knee: Secondary | ICD-10-CM

## 2015-11-01 NOTE — Progress Notes (Signed)
Pre visit review using our clinic review tool, if applicable. No additional management support is needed unless otherwise documented below in the visit note. 

## 2015-11-01 NOTE — Assessment & Plan Note (Signed)
Patient was given second in a series of 3 injections. Patient will be leaving out of town and come back again in 10 days. Will be fitted for an OA brace. Patient will come back and see me again for the final injection as stated above.

## 2015-11-01 NOTE — Patient Instructions (Signed)
Good to see you  Ice again in 6 hours  Overall I hope you will be another 10-20% better next week.  Keep trucking along' See you next week for 3rd and final injection

## 2015-11-01 NOTE — Progress Notes (Signed)
[name] [address] [place], [place] [address] Phone: [phone] Subjective:    I'm seeing this patient by the request  of:  Cathlean Cower, MD   CC: Left knee follow-up  [id number] Tara [name] is a 65 y.o. female coming in with complaint of  Bilateral knee pain.  Patient has been found to have severe arthritic changes of the knees bilaterally left greater than right.patient was seen previously and started viscous supplementation. Patient is here for second in a series of 3 injections.patient states that she did have some mild improvement. Having more stress though. Had to do a significant amount walking recently and that seem to exacerbate it.  Past Medical History  Diagnosis Date  . ANKLE PAIN, LEFT 04/01/2008  . ANXIETY 04/17/2007  . COLONIC POLYPS, HX OF 08/01/2007  . CONTUSIONS, MULTIPLE 04/01/2009  . DEPRESSION 04/17/2007  . DIZZINESS 08/01/2007  . DYSPNEA 08/01/2007  . Enlargement of lymph nodes 08/13/2007  . GLUCOSE INTOLERANCE 08/01/2007  . HYPERLIPIDEMIA 08/01/2007  . HYPERSOMNIA 07/28/2008  . HYPERTENSION 04/17/2007  . JOINT EFFUSION, LEFT KNEE 06/02/2010  . Loose body in knee 04/01/2009  . Morbid obesity ([place]) 04/20/2007  . OTHER DISEASES OF LUNG NOT ELSEWHERE CLASSIFIED 08/01/2007  . Pain in joint, lower leg 04/01/2009  . PERIPHERAL EDEMA 04/21/2009  . Sarcoidosis ([place]) 09/25/2007  . SHOULDER PAIN, LEFT 04/01/2009  . Impaired glucose tolerance 03/23/2011  . Migraines     "stopped 3-4 yr ago" (07/23/2012)  . 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 Chronic kidney disease    Past Surgical History  Procedure Laterality Date  . Myomectomy  1994  . Fracture surgery  ?02/1997    "left upper arm; put rod in" (07/23/2012)  . Refractive surgery  08/1998    "both eyes" (07/23/2012)  . Combined mediastinoscopy and bronchoscopy  08/2007  . Gum surgery  2000-?2009    "several ORs; soft tissue graft; took material from roof of mouth" (07/23/2012  . Knee  arthroscopy  03/2004; 06/2009    "right; left" Dr. Theda Sers  . Lymph node biopsy  ~ 2009    "for sarcoidosis; don't know exactly which nodes" (07/23/2102)  . Combined mediastinoscopy and bronchoscopy  2009  . Breast surgery      Biopsy benign   Social History  Substance Use Topics  . Smoking status: Never Smoker   . Smokeless tobacco: Never Used  . Alcohol Use: 4.2 oz/week    7 Standard drinks or equivalent per week   Allergies  Allergen Reactions  . Floxin [Ocuflox]     shaky  . Fluoxetine Hcl     Suicidal thoughts  . Sulfonamide Derivatives Rash   Family History  Problem Relation Age of Onset  . Heart disease Father 34  . Hypertension Father   . Diabetes Mother   . Hypertension Mother   . Stroke Mother   . Hypertension Sister   . Asthma Sister   . Colon cancer Neg Hx      Past medical history, social, surgical and family history all reviewed in electronic medical record.   Review of Systems: No headache, visual changes, nausea, vomiting, diarrhea, constipation, dizziness, abdominal pain, skin rash, fevers, chills, night sweats, weight loss, swollen lymph nodes, body aches, joint swelling, muscle aches, chest pain, shortness of breath, mood changes.   Objective Blood pressure 122/82, pulse 64, height 5\' 6"  (1.676 m), weight 238 lb (107.956 kg), SpO2 96 %.  General: No apparent distress alert and  oriented x3 mood and affect normal, dressed appropriately.  HEENT: Pupils equal, extraocular movements intact  Respiratory: Patient's speak in full sentences and does not appear short of breath  Cardiovascular: No lower extremity edema, non tender, no erythema  Skin: Warm dry intact with no signs of infection or rash on extremities or on axial skeleton.  Abdomen: Soft nontender  Neuro: Cranial nerves II through XII are intact, neurovascularly intact in all extremities with 2+ DTRs and 2+ pulses.  Lymph: No lymphadenopathy of posterior or anterior cervical chain or axillae  bilaterally.  Gait normal with good balance and coordination.  MSK:  Non tender with full range of motion and good stability and symmetric strength and tone of shoulders, elbows, wrist, hip, bilaterally.     Bilateral Knee exam shows the patient does have varus deformity of the knee. Significant instability even with walking. Antalgic gait noted. Severe pain over the medial joint line on the left side. Patient also has some bruising on the left knee medial aspect Contralateral knee has arthritic changes noted as well. No change from previous exam   After informed written and verbal consent, patient was seated on exam table. Left knee was prepped with alcohol swab and utilizing anterolateral approach, patient's left knee space was injected with 16 mg/2.5 mL of Synvisc (sodium hyaluronate) in a prefilled syringe was injected easily into the knee through a 22-gauge needle.. Patient tolerated the procedure well without immediate complications.   Impression and Recommendations:     This case required medical decision making of moderate complexity.

## 2015-11-18 ENCOUNTER — Ambulatory Visit (INDEPENDENT_AMBULATORY_CARE_PROVIDER_SITE_OTHER): Payer: BLUE CROSS/BLUE SHIELD | Admitting: Family Medicine

## 2015-11-18 ENCOUNTER — Encounter: Payer: Self-pay | Admitting: Family Medicine

## 2015-11-18 VITALS — BP 144/90 | HR 67 | Ht 66.0 in | Wt 238.0 lb

## 2015-11-18 DIAGNOSIS — M1712 Unilateral primary osteoarthritis, left knee: Secondary | ICD-10-CM

## 2015-11-18 DIAGNOSIS — M1710 Unilateral primary osteoarthritis, unspecified knee: Secondary | ICD-10-CM

## 2015-11-18 MED ORDER — TRAMADOL HCL 50 MG PO TABS: 50.0000 mg | ORAL_TABLET | Freq: Two times a day (BID) | ORAL | Status: DC | PRN

## 2015-11-18 NOTE — Progress Notes (Signed)
[name] [address] [place], [place] [address] Phone: [phone] Subjective:    I'm seeing this patient by the request  of:  Cathlean Cower, MD   CC: Left knee follow-up  [id number] Tara [name] is a 65 y.o. female coming in with complaint of  Bilateral knee pain.  Patient has been found to have severe arthritic changes of the knees bilaterally left greater than right.patient was seen previously and started viscous supplementation. Patient is here to finish her series of viscous supplementation today. No significant improvement at the moment.  Past Medical History  Diagnosis Date  . ANKLE PAIN, LEFT 04/01/2008  . ANXIETY 04/17/2007  . COLONIC POLYPS, HX OF 08/01/2007  . CONTUSIONS, MULTIPLE 04/01/2009  . DEPRESSION 04/17/2007  . DIZZINESS 08/01/2007  . DYSPNEA 08/01/2007  . Enlargement of lymph nodes 08/13/2007  . GLUCOSE INTOLERANCE 08/01/2007  . HYPERLIPIDEMIA 08/01/2007  . HYPERSOMNIA 07/28/2008  . HYPERTENSION 04/17/2007  . JOINT EFFUSION, LEFT KNEE 06/02/2010  . Loose body in knee 04/01/2009  . Morbid obesity ([place]) 04/20/2007  . OTHER DISEASES OF LUNG NOT ELSEWHERE CLASSIFIED 08/01/2007  . Pain in joint, lower leg 04/01/2009  . PERIPHERAL EDEMA 04/21/2009  . Sarcoidosis ([place]) 09/25/2007  . SHOULDER PAIN, LEFT 04/01/2009  . Impaired glucose tolerance 03/23/2011  . Migraines     "stopped 3-4 yr ago" (07/23/2012)  . 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 Chronic kidney disease    Past Surgical History  Procedure Laterality Date  . Myomectomy  1994  . Fracture surgery  ?02/1997    "left upper arm; put rod in" (07/23/2012)  . Refractive surgery  08/1998    "both eyes" (07/23/2012)  . Combined mediastinoscopy and bronchoscopy  08/2007  . Gum surgery  2000-?2009    "several ORs; soft tissue graft; took material from roof of mouth" (07/23/2012  . Knee arthroscopy  03/2004; 06/2009    "right; left" Dr. Theda Sers  . Lymph node biopsy  ~ 2009    "for  sarcoidosis; don't know exactly which nodes" (07/23/2102)  . Combined mediastinoscopy and bronchoscopy  2009  . Breast surgery      Biopsy benign   Social History  Substance Use Topics  . Smoking status: Never Smoker   . Smokeless tobacco: Never Used  . Alcohol Use: 4.2 oz/week    7 Standard drinks or equivalent per week   Allergies  Allergen Reactions  . Floxin [Ocuflox]     shaky  . Fluoxetine Hcl     Suicidal thoughts  . Sulfonamide Derivatives Rash   Family History  Problem Relation Age of Onset  . Heart disease Father 87  . Hypertension Father   . Diabetes Mother   . Hypertension Mother   . Stroke Mother   . Hypertension Sister   . Asthma Sister   . Colon cancer Neg Hx      Past medical history, social, surgical and family history all reviewed in electronic medical record.   Review of Systems: No headache, visual changes, nausea, vomiting, diarrhea, constipation, dizziness, abdominal pain, skin rash, fevers, chills, night sweats, weight loss, swollen lymph nodes, body aches, joint swelling, muscle aches, chest pain, shortness of breath, mood changes.   Objective There were no vitals taken for this visit.  General: No apparent distress alert and oriented x3 mood and affect normal, dressed appropriately.  HEENT: Pupils equal, extraocular movements intact  Respiratory: Patient's speak in full sentences and does not appear short of breath  Cardiovascular: No lower extremity edema, non tender, no erythema  Skin: Warm dry intact with no signs of infection or rash on extremities or on axial skeleton.  Abdomen: Soft nontender  Neuro: Cranial nerves II through XII are intact, neurovascularly intact in all extremities with 2+ DTRs and 2+ pulses.  Lymph: No lymphadenopathy of posterior or anterior cervical chain or axillae bilaterally.  Gait normal with good balance and coordination.  MSK:  Non tender with full range of motion and good stability and symmetric strength and tone  of shoulders, elbows, wrist, hip, bilaterally.     Bilateral Knee exam shows the patient does have varus deformity of the knee. Significant instability even with walking. Antalgic gait noted. Severe pain over the medial joint line on the left side. Patient also has some bruising on the left knee medial aspect Contralateral knee has arthritic changes noted as well. No change from previous exam   After informed written and verbal consent, patient was seated on exam table. Left knee was prepped with alcohol swab and utilizing anterolateral approach, patient's left knee space was injected with 16 mg/2.5 mL of Synvisc (sodium hyaluronate) in a prefilled syringe was injected easily into the knee through a 22-gauge needle.. Patient tolerated the procedure well without immediate complications.   Impression and Recommendations:     This case required medical decision making of moderate complexity.

## 2015-11-18 NOTE — Assessment & Plan Note (Signed)
Patient did not respond to the viscous supplementation yet. I'm hoping that this will be beneficial. We discussed icing regimen. Prescription for tramadol given. Patient come back and see me again in 1 month. If continued have pain patient will need surgical intervention.

## 2015-11-18 NOTE — Patient Instructions (Addendum)
Good to see you  I am going to keep my fingers crossed and this one will help you round the corner.  If not we can go back to a steroid injection  If pain is worse then we may need to consider replacement.  Tramadol 50mg  up to 2 times a day can help and take with 325 mg of tylenol Continue icing,  See me again in 1 month

## 2015-11-18 NOTE — Progress Notes (Signed)
Pre visit review using our clinic review tool, if applicable. No additional management support is needed unless otherwise documented below in the visit note. 

## 2015-11-22 DIAGNOSIS — N183 Chronic kidney disease, stage 3 (moderate): Secondary | ICD-10-CM | POA: Diagnosis not present

## 2015-11-22 DIAGNOSIS — I129 Hypertensive chronic kidney disease with stage 1 through stage 4 chronic kidney disease, or unspecified chronic kidney disease: Secondary | ICD-10-CM | POA: Diagnosis not present

## 2015-11-22 DIAGNOSIS — D631 Anemia in chronic kidney disease: Secondary | ICD-10-CM | POA: Diagnosis not present

## 2015-12-09 ENCOUNTER — Encounter (HOSPITAL_COMMUNITY): Payer: Self-pay | Admitting: *Deleted

## 2015-12-09 ENCOUNTER — Emergency Department (HOSPITAL_COMMUNITY): Payer: BLUE CROSS/BLUE SHIELD

## 2015-12-09 ENCOUNTER — Inpatient Hospital Stay (HOSPITAL_COMMUNITY)
Admission: EM | Admit: 2015-12-09 | Discharge: 2015-12-27 | DRG: 640 | Disposition: A | Discharge status: Skilled Nursing Facility | Payer: BLUE CROSS/BLUE SHIELD | Attending: Internal Medicine | Admitting: Internal Medicine

## 2015-12-09 DIAGNOSIS — F319 Bipolar disorder, unspecified: Secondary | ICD-10-CM | POA: Diagnosis present

## 2015-12-09 DIAGNOSIS — N184 Chronic kidney disease, stage 4 (severe): Secondary | ICD-10-CM | POA: Diagnosis present

## 2015-12-09 DIAGNOSIS — E860 Dehydration: Secondary | ICD-10-CM | POA: Diagnosis not present

## 2015-12-09 DIAGNOSIS — Z6837 Body mass index (BMI) 37.0-37.9, adult: Secondary | ICD-10-CM

## 2015-12-09 DIAGNOSIS — F330 Major depressive disorder, recurrent, mild: Secondary | ICD-10-CM | POA: Diagnosis present

## 2015-12-09 DIAGNOSIS — T502X5A Adverse effect of carbonic-anhydrase inhibitors, benzothiadiazides and other diuretics, initial encounter: Secondary | ICD-10-CM | POA: Diagnosis not present

## 2015-12-09 DIAGNOSIS — G9341 Metabolic encephalopathy: Secondary | ICD-10-CM | POA: Diagnosis present

## 2015-12-09 DIAGNOSIS — R4781 Slurred speech: Secondary | ICD-10-CM | POA: Diagnosis not present

## 2015-12-09 DIAGNOSIS — S37009S Unspecified injury of unspecified kidney, sequela: Secondary | ICD-10-CM | POA: Diagnosis not present

## 2015-12-09 DIAGNOSIS — R253 Fasciculation: Secondary | ICD-10-CM | POA: Diagnosis not present

## 2015-12-09 DIAGNOSIS — I129 Hypertensive chronic kidney disease with stage 1 through stage 4 chronic kidney disease, or unspecified chronic kidney disease: Secondary | ICD-10-CM | POA: Diagnosis present

## 2015-12-09 DIAGNOSIS — N390 Urinary tract infection, site not specified: Secondary | ICD-10-CM | POA: Diagnosis not present

## 2015-12-09 DIAGNOSIS — E785 Hyperlipidemia, unspecified: Secondary | ICD-10-CM | POA: Diagnosis present

## 2015-12-09 DIAGNOSIS — E876 Hypokalemia: Secondary | ICD-10-CM | POA: Diagnosis present

## 2015-12-09 DIAGNOSIS — R7989 Other specified abnormal findings of blood chemistry: Secondary | ICD-10-CM

## 2015-12-09 DIAGNOSIS — R4182 Altered mental status, unspecified: Secondary | ICD-10-CM | POA: Diagnosis not present

## 2015-12-09 DIAGNOSIS — R471 Dysarthria and anarthria: Secondary | ICD-10-CM

## 2015-12-09 DIAGNOSIS — N179 Acute kidney failure, unspecified: Secondary | ICD-10-CM | POA: Diagnosis present

## 2015-12-09 DIAGNOSIS — R05 Cough: Secondary | ICD-10-CM | POA: Diagnosis not present

## 2015-12-09 DIAGNOSIS — F419 Anxiety disorder, unspecified: Secondary | ICD-10-CM | POA: Diagnosis not present

## 2015-12-09 DIAGNOSIS — I248 Other forms of acute ischemic heart disease: Secondary | ICD-10-CM | POA: Diagnosis not present

## 2015-12-09 DIAGNOSIS — Z7982 Long term (current) use of aspirin: Secondary | ICD-10-CM | POA: Diagnosis not present

## 2015-12-09 DIAGNOSIS — B9620 Unspecified Escherichia coli [E. coli] as the cause of diseases classified elsewhere: Secondary | ICD-10-CM | POA: Diagnosis present

## 2015-12-09 DIAGNOSIS — R6889 Other general symptoms and signs: Secondary | ICD-10-CM | POA: Diagnosis not present

## 2015-12-09 DIAGNOSIS — R410 Disorientation, unspecified: Secondary | ICD-10-CM | POA: Diagnosis not present

## 2015-12-09 DIAGNOSIS — N189 Chronic kidney disease, unspecified: Secondary | ICD-10-CM | POA: Diagnosis not present

## 2015-12-09 DIAGNOSIS — F3110 Bipolar disorder, current episode manic without psychotic features, unspecified: Secondary | ICD-10-CM | POA: Diagnosis not present

## 2015-12-09 DIAGNOSIS — R278 Other lack of coordination: Secondary | ICD-10-CM | POA: Diagnosis not present

## 2015-12-09 DIAGNOSIS — I10 Essential (primary) hypertension: Secondary | ICD-10-CM | POA: Diagnosis not present

## 2015-12-09 DIAGNOSIS — G9340 Encephalopathy, unspecified: Secondary | ICD-10-CM | POA: Diagnosis not present

## 2015-12-09 DIAGNOSIS — G43909 Migraine, unspecified, not intractable, without status migrainosus: Secondary | ICD-10-CM | POA: Diagnosis not present

## 2015-12-09 DIAGNOSIS — F339 Major depressive disorder, recurrent, unspecified: Secondary | ICD-10-CM | POA: Diagnosis not present

## 2015-12-09 DIAGNOSIS — D869 Sarcoidosis, unspecified: Secondary | ICD-10-CM | POA: Diagnosis present

## 2015-12-09 DIAGNOSIS — R778 Other specified abnormalities of plasma proteins: Secondary | ICD-10-CM | POA: Diagnosis present

## 2015-12-09 DIAGNOSIS — N19 Unspecified kidney failure: Secondary | ICD-10-CM | POA: Diagnosis not present

## 2015-12-09 DIAGNOSIS — M6259 Muscle wasting and atrophy, not elsewhere classified, multiple sites: Secondary | ICD-10-CM | POA: Diagnosis not present

## 2015-12-09 DIAGNOSIS — Z09 Encounter for follow-up examination after completed treatment for conditions other than malignant neoplasm: Secondary | ICD-10-CM

## 2015-12-09 DIAGNOSIS — M6281 Muscle weakness (generalized): Secondary | ICD-10-CM | POA: Diagnosis not present

## 2015-12-09 LAB — CBC
HEMATOCRIT: 32.4 % — AB (ref 36.0–46.0)
Hemoglobin: 10.8 g/dL — ABNORMAL LOW (ref 12.0–15.0)
MCH: 26.2 pg (ref 26.0–34.0)
MCHC: 33.3 g/dL (ref 30.0–36.0)
MCV: 78.6 fL (ref 78.0–100.0)
Platelets: 255 10*3/uL (ref 150–400)
RBC: 4.12 MIL/uL (ref 3.87–5.11)
RDW: 15.2 % (ref 11.5–15.5)
WBC: 7.3 10*3/uL (ref 4.0–10.5)

## 2015-12-09 LAB — URINALYSIS, ROUTINE W REFLEX MICROSCOPIC
Bilirubin Urine: NEGATIVE
Glucose, UA: NEGATIVE mg/dL
Ketones, ur: NEGATIVE mg/dL
NITRITE: NEGATIVE
Protein, ur: 30 mg/dL — AB
SPECIFIC GRAVITY, URINE: 1.013 (ref 1.005–1.030)
pH: 6.5 (ref 5.0–8.0)

## 2015-12-09 LAB — COMPREHENSIVE METABOLIC PANEL
ALT: 27 U/L (ref 14–54)
AST: 34 U/L (ref 15–41)
Albumin: 3.9 g/dL (ref 3.5–5.0)
Alkaline Phosphatase: 71 U/L (ref 38–126)
Anion gap: 9 (ref 5–15)
BUN: 23 mg/dL — AB (ref 6–20)
CHLORIDE: 102 mmol/L (ref 101–111)
CO2: 28 mmol/L (ref 22–32)
Calcium: 14.5 mg/dL (ref 8.9–10.3)
Creatinine, Ser: 2.82 mg/dL — ABNORMAL HIGH (ref 0.44–1.00)
GFR, EST AFRICAN AMERICAN: 19 mL/min — AB (ref >60)
GFR, EST NON AFRICAN AMERICAN: 17 mL/min — AB (ref >60)
Glucose, Bld: 95 mg/dL (ref 65–99)
POTASSIUM: 2.3 mmol/L — AB (ref 3.5–5.1)
Sodium: 139 mmol/L (ref 135–145)
Total Bilirubin: 0.5 mg/dL (ref 0.3–1.2)
Total Protein: 7.8 g/dL (ref 6.5–8.1)

## 2015-12-09 LAB — CBG MONITORING, ED: Glucose-Capillary: 86 mg/dL (ref 65–99)

## 2015-12-09 LAB — ETHANOL: Alcohol, Ethyl (B): <5 mg/dL (ref <5)

## 2015-12-09 LAB — I-STAT TROPONIN, ED: Troponin i, poc: 0.16 ng/mL (ref 0.00–0.08)

## 2015-12-09 LAB — URINE MICROSCOPIC-ADD ON

## 2015-12-09 LAB — AMMONIA: Ammonia: 17 umol/L (ref 9–35)

## 2015-12-09 LAB — RAPID URINE DRUG SCREEN, HOSP PERFORMED
AMPHETAMINES: POSITIVE — AB
Barbiturates: NOT DETECTED
Benzodiazepines: POSITIVE — AB
COCAINE: NOT DETECTED
OPIATES: NOT DETECTED
TETRAHYDROCANNABINOL: NOT DETECTED

## 2015-12-09 LAB — I-STAT CG4 LACTIC ACID, ED: Lactic Acid, Venous: 1.06 mmol/L (ref 0.5–2.0)

## 2015-12-09 MED ORDER — ONDANSETRON HCL 4 MG PO TABS: 4.0000 mg | ORAL_TABLET | Freq: Four times a day (QID) | ORAL | Status: DC | PRN

## 2015-12-09 MED ORDER — SODIUM CHLORIDE 0.9 % IV BOLUS (SEPSIS)
500.0000 mL | Freq: Once | INTRAVENOUS | Status: AC
  Administered 2015-12-09: 500 mL via INTRAVENOUS

## 2015-12-09 MED ORDER — ACETAMINOPHEN 650 MG RE SUPP: 650.0000 mg | Freq: Four times a day (QID) | RECTAL | Status: DC | PRN

## 2015-12-09 MED ORDER — POTASSIUM CHLORIDE 20 MEQ PO PACK
40.0000 meq | PACK | Freq: Once | ORAL | Status: AC
  Administered 2015-12-09: 40 meq via ORAL
  Filled 2015-12-09 (×2): qty 2

## 2015-12-09 MED ORDER — HYDRALAZINE HCL 20 MG/ML IJ SOLN
10.0000 mg | Freq: Four times a day (QID) | INTRAMUSCULAR | Status: DC | PRN
  Administered 2015-12-10: 10 mg via INTRAVENOUS
  Filled 2015-12-09: qty 1

## 2015-12-09 MED ORDER — POTASSIUM CHLORIDE 10 MEQ/100ML IV SOLN
10.0000 meq | INTRAVENOUS | Status: AC
  Administered 2015-12-10 (×3): 10 meq via INTRAVENOUS
  Filled 2015-12-09 (×3): qty 100

## 2015-12-09 MED ORDER — ONDANSETRON HCL 4 MG/2ML IJ SOLN: 4.0000 mg | Freq: Four times a day (QID) | INTRAMUSCULAR | Status: DC | PRN

## 2015-12-09 MED ORDER — HYDROMORPHONE HCL 1 MG/ML IJ SOLN
0.5000 mg | INTRAMUSCULAR | Status: DC | PRN
  Administered 2015-12-10 – 2015-12-11 (×7): 1 mg via INTRAVENOUS
  Filled 2015-12-09 (×7): qty 1

## 2015-12-09 MED ORDER — ACETAMINOPHEN 325 MG PO TABS
650.0000 mg | ORAL_TABLET | Freq: Four times a day (QID) | ORAL | Status: DC | PRN
  Filled 2015-12-09: qty 2

## 2015-12-09 MED ORDER — SODIUM CHLORIDE 0.9 % IV SOLN
60.0000 mg | Freq: Once | INTRAVENOUS | Status: AC
  Administered 2015-12-10: 60 mg via INTRAVENOUS
  Filled 2015-12-09: qty 20

## 2015-12-09 MED ORDER — DILTIAZEM HCL ER COATED BEADS 240 MG PO CP24
240.0000 mg | ORAL_CAPSULE | Freq: Every day | ORAL | Status: DC
  Administered 2015-12-10 – 2015-12-27 (×18): 240 mg via ORAL
  Filled 2015-12-09 (×19): qty 1

## 2015-12-09 MED ORDER — ENOXAPARIN SODIUM 30 MG/0.3ML ~~LOC~~ SOLN
30.0000 mg | SUBCUTANEOUS | Status: DC
  Administered 2015-12-09 – 2015-12-10 (×2): 30 mg via SUBCUTANEOUS
  Filled 2015-12-09 (×2): qty 0.3

## 2015-12-09 MED ORDER — POTASSIUM CHLORIDE 10 MEQ/100ML IV SOLN
10.0000 meq | INTRAVENOUS | Status: AC
  Administered 2015-12-09 (×3): 10 meq via INTRAVENOUS
  Filled 2015-12-09 (×3): qty 100

## 2015-12-09 MED ORDER — DEXTROSE 5 % IV SOLN
1.0000 g | Freq: Once | INTRAVENOUS | Status: AC
  Administered 2015-12-09: 1 g via INTRAVENOUS
  Filled 2015-12-09: qty 10

## 2015-12-09 MED ORDER — DEXTROSE 5 % IV SOLN
1.0000 g | INTRAVENOUS | Status: DC
  Administered 2015-12-10 – 2015-12-16 (×7): 1 g via INTRAVENOUS
  Filled 2015-12-09 (×8): qty 10

## 2015-12-09 MED ORDER — SODIUM CHLORIDE 0.9 % IV SOLN
INTRAVENOUS | Status: DC
  Administered 2015-12-09 – 2015-12-12 (×5): via INTRAVENOUS

## 2015-12-09 MED ORDER — ATENOLOL 50 MG PO TABS
50.0000 mg | ORAL_TABLET | Freq: Every day | ORAL | Status: DC
  Administered 2015-12-10 – 2015-12-27 (×18): 50 mg via ORAL
  Filled 2015-12-09 (×18): qty 1

## 2015-12-09 MED ORDER — CALCITONIN (SALMON) 200 UNIT/ACT NA SOLN
1.0000 | Freq: Every day | NASAL | Status: DC
  Administered 2015-12-10 – 2015-12-26 (×15): 1 via NASAL
  Filled 2015-12-09 (×4): qty 3.7

## 2015-12-09 NOTE — ED Provider Notes (Signed)
CSN: [id number]     Arrival date & time 12/09/15  1812 History   First MD Initiated Contact with Patient 12/09/15 1838     Chief Complaint  Patient presents with  . Altered Mental Status     (Consider location/radiation/quality/duration/timing/severity/associated sxs/prior Treatment) HPI Comments: 65 y.o. Female with history of anxiety, hyperlipidemia, sarcoidosis presents for altered mental status.  The patient was brought in by EMS after being found at home by coworkers who went to check on her when she did not show up for work.  Reportedly she had a bad day at work yesterday which she reports was just because she arrived a little late and then didn't get home until midnight.  The police were apparently called last night and walked her home after she went to her neighbors house to talk late at night.  Patient only gives limited history but answers basic questions.  She knows she is at the hospital, her name, and general time frame.  Denies any complaints.  Reports taking double of her clonazepam this AM.  Denies alcohol consumption.  Denies fever, chills, chest pain, shortness of breath.   Past Medical History  Diagnosis Date  . ANKLE PAIN, LEFT 04/01/2008  . ANXIETY 04/17/2007  . COLONIC POLYPS, HX OF 08/01/2007  . CONTUSIONS, MULTIPLE 04/01/2009  . DEPRESSION 04/17/2007  . DIZZINESS 08/01/2007  . DYSPNEA 08/01/2007  . Enlargement of lymph nodes 08/13/2007  . GLUCOSE INTOLERANCE 08/01/2007  . HYPERLIPIDEMIA 08/01/2007  . HYPERSOMNIA 07/28/2008  . HYPERTENSION 04/17/2007  . JOINT EFFUSION, LEFT KNEE 06/02/2010  . Loose body in knee 04/01/2009  . Morbid obesity ([place]) 04/20/2007  . OTHER DISEASES OF LUNG NOT ELSEWHERE CLASSIFIED 08/01/2007  . Pain in joint, lower leg 04/01/2009  . PERIPHERAL EDEMA 04/21/2009  . Sarcoidosis ([place]) 09/25/2007  . SHOULDER PAIN, LEFT 04/01/2009  . Impaired glucose tolerance 03/23/2011  . Migraines     "stopped 3-4 yr ago" (07/23/2012)  . Hypercalcemia due to sarcoidosis  2014  . Chronic kidney disease    Past Surgical History  Procedure Laterality Date  . Myomectomy  1994  . Fracture surgery  ?02/1997    "left upper arm; put rod in" (07/23/2012)  . Refractive surgery  08/1998    "both eyes" (07/23/2012)  . Combined mediastinoscopy and bronchoscopy  08/2007  . Gum surgery  2000-?2009    "several ORs; soft tissue graft; took material from roof of mouth" (07/23/2012  . Knee arthroscopy  03/2004; 06/2009    "right; left" Dr. Theda Sers  . Lymph node biopsy  ~ 2009    "for sarcoidosis; don't know exactly which nodes" (07/23/2102)  . Combined mediastinoscopy and bronchoscopy  2009  . Breast surgery      Biopsy benign   Family History  Problem Relation Age of Onset  . Heart disease Father 39  . Hypertension Father   . Diabetes Mother   . Hypertension Mother   . Stroke Mother   . Hypertension Sister   . Asthma Sister   . Colon cancer Neg Hx    Social History  Substance Use Topics  . Smoking status: Never Smoker   . Smokeless tobacco: Never Used  . Alcohol Use: 4.2 oz/week    7 Standard drinks or equivalent per week   OB History    Gravida Para Term Preterm AB TAB SAB Ectopic Multiple Living   0              Review of Systems  Unable to perform ROS:  Mental status change  Constitutional: Negative for fever and chills.  HENT: Negative for congestion, postnasal drip and trouble swallowing.   Eyes: Negative for pain.  Respiratory: Negative for cough and shortness of breath.   Cardiovascular: Negative for chest pain.  Gastrointestinal: Negative for nausea, vomiting and abdominal pain.  Genitourinary: Negative for dysuria and flank pain.  Musculoskeletal: Negative for myalgias and back pain.  Skin: Negative for rash.  Neurological: Negative for dizziness, weakness and headaches.  Hematological: Does not bruise/bleed easily.      Allergies  Chocolate; Floxin; Fluoxetine hcl; Other; and Sulfonamide derivatives  Home Medications   Prior to Admission  medications   Medication Sig Start Date End Date Taking? Authorizing Provider  amphetamine-dextroamphetamine (ADDERALL) 30 MG tablet Take 30 mg by mouth 2 (two) times daily.    Historical Provider, MD  aspirin 81 MG tablet Take 81 mg by mouth daily.      Historical Provider, MD  atenolol (TENORMIN) 50 MG tablet TAKE 1 TABLET(50 MG) BY MOUTH DAILY 07/27/15   Biagio Borg, MD  calcitonin, salmon, (MIACALCIN/FORTICAL) 200 UNIT/ACT nasal spray Place 1 spray into alternate nostrils. 01/14/15   Historical Provider, MD  clorazepate (TRANXENE) 7.5 MG tablet Take 7.5 mg by mouth QID.     Historical Provider, MD  DILT-XR 240 MG 24 hr capsule TAKE 1 CAPSULE BY MOUTH DAILY 10/19/15   Biagio Borg, MD  diltiazem (CARTIA XT) 240 MG 24 hr capsule Take 1 capsule (240 mg total) by mouth daily. 05/06/14   Biagio Borg, MD  ferrous sulfate 325 (65 FE) MG tablet Take 325 mg by mouth daily with breakfast.    Historical Provider, MD  glucosamine-chondroitin 500-400 MG tablet Take 1 tablet by mouth daily.     Historical Provider, MD  HYDROcodone-acetaminophen (NORCO/VICODIN) 5-325 MG tablet Take 1 tablet by mouth every 6 (six) hours as needed. 10/12/15   Montine Circle, PA-C  Multiple Vitamins-Minerals (MULTIVITAMIN WITH MINERALS) tablet Take 1 tablet by mouth daily.    Historical Provider, MD  PRISTIQ 100 MG 24 hr tablet Take 1 tablet by mouth at bedtime as needed.  04/13/15   Historical Provider, MD  torsemide (DEMADEX) 20 MG tablet Take 1 tablet (20 mg total) by mouth daily. 07/06/14   Biagio Borg, MD  traMADol (ULTRAM) 50 MG tablet Take 1 tablet (50 mg total) by mouth every 12 (twelve) hours as needed. 11/18/15   Lyndal Pulley, DO  valsartan (DIOVAN) 320 MG tablet TAKE 1 TABLET BY MOUTH EVERY DAY 09/12/15   Biagio Borg, MD  zaleplon (SONATA) 10 MG capsule Take 10 mg by mouth at bedtime.    Historical Provider, MD   BP 161/78 mmHg  Pulse 64  Temp(Src) 98.4 F (36.9 C) (Rectal)  Resp 17  Ht 5\' 6"  (1.676 m)  Wt 235 lb  (106.595 kg)  BMI 37.95 kg/m2  SpO2 98% Physical Exam  Constitutional: She is oriented to person, place, and time. No distress.  Appears confused and disheveled  HENT:  Head: Normocephalic and atraumatic.  Right Ear: External ear normal.  Left Ear: External ear normal.  Nose: Nose normal.  Mouth/Throat: Oropharynx is clear and moist. No oropharyngeal exudate.  Eyes: EOM are normal. Pupils are equal, round, and reactive to light.  Neck: Normal range of motion. Neck supple.  Cardiovascular: Normal rate, regular rhythm, normal heart sounds and intact distal pulses.   No murmur heard. Pulmonary/Chest: Effort normal. No respiratory distress. She has no wheezes. She has  no rales.  Abdominal: Soft. She exhibits no distension. There is no tenderness.  Musculoskeletal: Normal range of motion. She exhibits no edema or tenderness.  Neurological: She is alert and oriented to person, place, and time. No cranial nerve deficit. She exhibits normal muscle tone.  Skin: Skin is warm. No rash noted. She is diaphoretic.  Vitals reviewed.   ED Course  Procedures (including critical care time) Labs Review Labs Reviewed  COMPREHENSIVE METABOLIC PANEL - Abnormal; Notable for the following:    Potassium 2.3 (*)    BUN 23 (*)    Creatinine, Ser 2.82 (*)    Calcium 14.5 (*)    GFR calc non Af Amer 17 (*)    GFR calc Af Amer 19 (*)    All other components within normal limits  CBC - Abnormal; Notable for the following:    Hemoglobin 10.8 (*)    HCT 32.4 (*)    All other components within normal limits  URINALYSIS, ROUTINE W REFLEX MICROSCOPIC (NOT AT [facility]) - Abnormal; Notable for the following:    APPearance CLOUDY (*)    Hgb urine dipstick SMALL (*)    Protein, ur 30 (*)    Leukocytes, UA MODERATE (*)    All other components within normal limits  URINE RAPID DRUG SCREEN, HOSP PERFORMED - Abnormal; Notable for the following:    Benzodiazepines POSITIVE (*)    Amphetamines POSITIVE (*)    All  other components within normal limits  URINE MICROSCOPIC-ADD ON - Abnormal; Notable for the following:    Squamous Epithelial / LPF 0-5 (*)    Bacteria, UA FEW (*)    All other components within normal limits  I-STAT TROPOININ, ED - Abnormal; Notable for the following:    Troponin i, poc 0.16 (*)    All other components within normal limits  URINE CULTURE  ETHANOL  AMMONIA  CBG MONITORING, ED  I-STAT CG4 LACTIC ACID, ED    Imaging Review Dg Chest 2 View  12/09/2015  CLINICAL DATA:  Altered mental status. EXAM: CHEST  2 VIEW COMPARISON:  October 12, 2015. FINDINGS: The heart size and mediastinal contours are within normal limits. Both lungs are clear. No pneumothorax or pleural effusion is noted. The visualized skeletal structures are unremarkable. IMPRESSION: No active cardiopulmonary disease. Electronically Signed   By: Marijo Conception, M.D.   On: 12/09/2015 19:44   Ct Head Wo Contrast  12/09/2015  CLINICAL DATA:  Altered mental status. EXAM: CT HEAD WITHOUT CONTRAST TECHNIQUE: Contiguous axial images were obtained from the base of the skull through the vertex without intravenous contrast. COMPARISON:  CT scan of July 23, 2012. FINDINGS: Bony calvarium appears intact. No mass effect or midline shift is noted. Ventricular size is within normal limits. There is no evidence of mass lesion, hemorrhage or acute infarction. IMPRESSION: Normal head CT. Electronically Signed   By: Marijo Conception, M.D.   On: 12/09/2015 19:55   I have personally reviewed and evaluated these images and lab results as part of my medical decision-making.   EKG Interpretation   Date/Time:  Friday Dec 09 2015 18:23:02 EDT Ventricular Rate:  62 PR Interval:  192 QRS Duration: 111 QT Interval:  369 QTC Calculation: 375 R Axis:   18 Text Interpretation:  Sinus rhythm Nonspecific repol abnormality, diffuse  leads T wave flattening compared to previous tracing Confirmed by Lonia Skinner ([id number]) on 12/09/2015  6:46:33 PM      MDM  Patient seen and evaluated in  stable condition.  Appears confused and unkempt but answers questions appropriately.  LAbs reveal hypokalemia, hypercalcemia, elevated troponin.  EKG with t wave flattening likely secondary to hypokalemia.  Potassium supplemented.  IV fluids given for hypercalcemia.  No sign of chest pain or shortness of breath at this time.  Discussed with Dr. Arnoldo Morale from [facility] who agrees with admission and patient admitted to Telemetry for continued treatment, trending troponins. Final diagnoses:  None    1. Hypercalcemia  2. Hypokalemia  3. Elevated troponin    Harvel Quale, MD 12/11/15 2045

## 2015-12-09 NOTE — H&P (Addendum)
[facility] Admission History and Physical       Tara [name] [id number] DOB: September 23, 1950 DOA: 12/09/2015  Referring physician: EDP PCP: Cathlean Cower, MD  Specialists:   Chief Complaint: Confusion  HPI: Tara [name] is a 65 y.o. female with a history of Sarcoidosis, HTN, CKD who did not show up for work today and her colleagues had [employer] go to her home and she was found to be severely confused.  [employer], and EMS had been told that she had a very bad day at work the day before.   In the ED, patient was evaluated and a CT scan of the head was performed and was negative for acute findings, and so was the Chest X-Ray.   Laboratory Studies revealed Hypercalcemia at 14.5, and Hypokalemia at 2.3, along with a elevated BUN/Cr  Of 23/2.82.   She also had an elevated Troponin level at 0.16.    She was referred for admission.       Review of Systems:   Unable to Obtain from Patient  Past Medical History  Diagnosis Date  . ANKLE PAIN, LEFT 04/01/2008  . ANXIETY 04/17/2007  . COLONIC POLYPS, HX OF 08/01/2007  . CONTUSIONS, MULTIPLE 04/01/2009  . DEPRESSION 04/17/2007  . DIZZINESS 08/01/2007  . DYSPNEA 08/01/2007  . Enlargement of lymph nodes 08/13/2007  . GLUCOSE INTOLERANCE 08/01/2007  . HYPERLIPIDEMIA 08/01/2007  . HYPERSOMNIA 07/28/2008  . HYPERTENSION 04/17/2007  . JOINT EFFUSION, LEFT KNEE 06/02/2010  . Loose body in knee 04/01/2009  . Morbid obesity ([place]) 04/20/2007  . OTHER DISEASES OF LUNG NOT ELSEWHERE CLASSIFIED 08/01/2007  . Pain in joint, lower leg 04/01/2009  . PERIPHERAL EDEMA 04/21/2009  . Sarcoidosis ([place]) 09/25/2007  . SHOULDER PAIN, LEFT 04/01/2009  . Impaired glucose tolerance 03/23/2011  . Migraines     "stopped 3-4 yr ago" (07/23/2012)  . 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 Chronic kidney disease      Past Surgical History  Procedure Laterality Date  . Myomectomy  1994  . Fracture surgery  ?02/1997    "left upper arm; put rod in" (07/23/2012)  . Refractive surgery   08/1998    "both eyes" (07/23/2012)  . Combined mediastinoscopy and bronchoscopy  08/2007  . Gum surgery  2000-?2009    "several ORs; soft tissue graft; took material from roof of mouth" (07/23/2012  . Knee arthroscopy  03/2004; 06/2009    "right; left" Dr. Theda Sers  . Lymph node biopsy  ~ 2009    "for sarcoidosis; don't know exactly which nodes" (07/23/2102)  . Combined mediastinoscopy and bronchoscopy  2009  . Breast surgery      Biopsy benign      Prior to Admission medications   Medication Sig Start Date End Date Taking? Authorizing Provider  amphetamine-dextroamphetamine (ADDERALL) 30 MG tablet Take 30 mg by mouth 2 (two) times daily.    Historical Provider, MD  aspirin 81 MG tablet Take 81 mg by mouth daily.      Historical Provider, MD  atenolol (TENORMIN) 50 MG tablet TAKE 1 TABLET(50 MG) BY MOUTH DAILY 07/27/15   Biagio Borg, MD  calcitonin, salmon, (MIACALCIN/FORTICAL) 200 UNIT/ACT nasal spray Place 1 spray into alternate nostrils. 01/14/15   Historical Provider, MD  clorazepate (TRANXENE) 7.5 MG tablet Take 7.5 mg by mouth QID.     Historical Provider, MD  DILT-XR 240 MG 24 hr capsule TAKE 1 CAPSULE BY MOUTH DAILY 10/19/15   Biagio Borg, MD  diltiazem (CARTIA XT) 240 MG 24  hr capsule Take 1 capsule (240 mg total) by mouth daily. 05/06/14   Biagio Borg, MD  ferrous sulfate 325 (65 FE) MG tablet Take 325 mg by mouth daily with breakfast.    Historical Provider, MD  glucosamine-chondroitin 500-400 MG tablet Take 1 tablet by mouth daily.     Historical Provider, MD  HYDROcodone-acetaminophen (NORCO/VICODIN) 5-325 MG tablet Take 1 tablet by mouth every 6 (six) hours as needed. 10/12/15   Montine Circle, PA-C  Multiple Vitamins-Minerals (MULTIVITAMIN WITH MINERALS) tablet Take 1 tablet by mouth daily.    Historical Provider, MD  PRISTIQ 100 MG 24 hr tablet Take 1 tablet by mouth at bedtime as needed.  04/13/15   Historical Provider, MD  torsemide (DEMADEX) 20 MG tablet Take 1 tablet (20 mg  total) by mouth daily. 07/06/14   Biagio Borg, MD  traMADol (ULTRAM) 50 MG tablet Take 1 tablet (50 mg total) by mouth every 12 (twelve) hours as needed. 11/18/15   Lyndal Pulley, DO  valsartan (DIOVAN) 320 MG tablet TAKE 1 TABLET BY MOUTH EVERY DAY 09/12/15   Biagio Borg, MD  zaleplon (SONATA) 10 MG capsule Take 10 mg by mouth at bedtime.    Historical Provider, MD     Allergies  Allergen Reactions  . Chocolate Other (See Comments)    SOMETIMES CAUSES SEVERE HEADACHES  . Floxin [Ocuflox]     shaky  . Fluoxetine Hcl     Suicidal thoughts  . Other Nausea And Vomiting    INSTANT ICED TEA PACKETS  . Sulfonamide Derivatives Rash    Social History:  reports that she has never smoked. She has never used smokeless tobacco. She reports that she drinks about 4.2 oz of alcohol per week. She reports that she does not use illicit drugs.    Family History  Problem Relation Age of Onset  . Heart disease Father 63  . Hypertension Father   . Diabetes Mother   . Hypertension Mother   . Stroke Mother   . Hypertension Sister   . Asthma Sister   . Colon cancer Neg Hx        Physical Exam:  GEN:  Pleasant Confused Obese 65 y.o. Caucasian female examined and in no acute distress; cooperative with exam Filed Vitals:   12/09/15 2045 12/09/15 2100 12/09/15 2115 12/09/15 2130  BP: 172/93 179/80 161/78 172/83  Pulse: 61 64 64 61  Temp:      TempSrc:      Resp: 16 14 17 16   Height:      Weight:      SpO2: 99% 100% 98% 100%   Blood pressure 172/83, pulse 61, temperature 98.4 F (36.9 C), temperature source Rectal, resp. rate 16, height 5\' 6"  (1.676 m), weight 106.595 kg (235 lb), SpO2 100 %. PSYCH: She is alert and oriented x4; does not appear anxious does not appear depressed; affect is normal HEENT: Normocephalic and Atraumatic, Mucous membranes pink; PERRLA; EOM intact; Fundi:  Benign;  No scleral icterus, Nares: Patent, Oropharynx: Clear, Fair Dentition,    Neck:  FROM, No Cervical  Lymphadenopathy nor Thyromegaly or Carotid Bruit; No JVD; Breasts:: Not examined CHEST WALL: No tenderness CHEST: Normal respiration, clear to auscultation bilaterally HEART: Regular rate and rhythm; no murmurs rubs or gallops BACK: No kyphosis or scoliosis; No CVA tenderness ABDOMEN: Positive Bowel Sounds, Obese, Soft Non-Tender, No Rebound or Guarding; No Masses, No Organomegaly Rectal Exam: Not done EXTREMITIES: No Cyanosis, Clubbing, or Edema; No Ulcerations. Genitalia: not  examined PULSES: 2+ and symmetric SKIN: Normal hydration no rash or ulceration CNS:  Alert and Oriented x  1, No Focal Deficits Vascular: pulses palpable throughout    Labs on Admission:  Basic Metabolic Panel:  Recent Labs Lab 12/09/15 1850  NA 139  K 2.3*  CL 102  CO2 28  GLUCOSE 95  BUN 23*  CREATININE 2.82*  CALCIUM 14.5*   Liver Function Tests:  Recent Labs Lab 12/09/15 1850  AST 34  ALT 27  ALKPHOS 71  BILITOT 0.5  PROT 7.8  ALBUMIN 3.9   No results for input(s): LIPASE, AMYLASE in the last 168 hours.  Recent Labs Lab 12/09/15 2014  AMMONIA 17   CBC:  Recent Labs Lab 12/09/15 1850  WBC 7.3  HGB 10.8*  HCT 32.4*  MCV 78.6  PLT 255   Cardiac Enzymes: No results for input(s): CKTOTAL, CKMB, CKMBINDEX, TROPONINI in the last 168 hours.  BNP (last 3 results) No results for input(s): BNP in the last 8760 hours.  ProBNP (last 3 results) No results for input(s): PROBNP in the last 8760 hours.  CBG:  Recent Labs Lab 12/09/15 1832  GLUCAP 74    Radiological Exams on Admission: Dg Chest 2 View  12/09/2015  CLINICAL DATA:  Altered mental status. EXAM: CHEST  2 VIEW COMPARISON:  October 12, 2015. FINDINGS: The heart size and mediastinal contours are within normal limits. Both lungs are clear. No pneumothorax or pleural effusion is noted. The visualized skeletal structures are unremarkable. IMPRESSION: No active cardiopulmonary disease. Electronically Signed   By: Marijo Conception, M.D.   On: 12/09/2015 19:44   Ct Head Wo Contrast  12/09/2015  CLINICAL DATA:  Altered mental status. EXAM: CT HEAD WITHOUT CONTRAST TECHNIQUE: Contiguous axial images were obtained from the base of the skull through the vertex without intravenous contrast. COMPARISON:  CT scan of July 23, 2012. FINDINGS: Bony calvarium appears intact. No mass effect or midline shift is noted. Ventricular size is within normal limits. There is no evidence of mass lesion, hemorrhage or acute infarction. IMPRESSION: Normal head CT. Electronically Signed   By: Marijo Conception, M.D.   On: 12/09/2015 19:55     EKG: Independently reviewed. Normal Sinus Rhythm  Rate = 62    Assessment/Plan:     65 y.o. female with  Principal Problem:    Metabolic encephalopathy- due to Hypercalcemia and Uremia   IVFs   IV Pamidronate   Correct Electrolytes   Cardiac Monitoring  Active Problems:    Hypercalcemia   Cardiac Monitoring   IV Pamidronate   Resume Nasal Calcitonin      Hypokalemia- due to Diuretic Rx   Replace K+ IV and PO   Check Magnesium level Replace PRN    UTI   Urine C+S sent   IV Rocephin      Acute on chronic kidney failure (HCC)   Hold Torsemide   IVFs     CKD (chronic kidney disease) stage 4, GFR 15-29 ml/min (HCC)   Monitor BUN/Cr   Hold Torsemide Rx        Elevated troponin   Cardiac Monitoring   Cycle Troponins    Sarcoidosis (HCC)   Hx      Essential hypertension   Monitor BPs        DVT Prophylaxis   Lovenox    Code Status:     FULL CODE        Family Communication:   No Family Present  Disposition Plan:    Inpatient Status        Time spent:  [facility] Pager [phone]   If [place] Please Contact the Day Rounding Team MD for [facility]  If 7PM-7AM, Please Contact Night-Floor Coverage  [email or web address] Password [facility] 12/09/2015, 9:46 PM     ADDENDUM:   Patient was seen and examined on  12/09/2015

## 2015-12-09 NOTE — ED Notes (Signed)
Potassium 2.3, critical lab value, calcium 14.5

## 2015-12-09 NOTE — ED Notes (Signed)
Dr. Alfonse Spruce notified of lab values

## 2015-12-09 NOTE — ED Notes (Signed)
Pt returned from CT °

## 2015-12-09 NOTE — ED Notes (Addendum)
Pt here via [facility] from home for altered mental status.  Pt's co-workers went to her house today b/c she did not show up to work (they also stated she "had a bad day at work yesterday".  Per neighbors, last night pt knocked on their door and came over to talk to them, so they called the police b/c she never does that and [employer] walked pt back to her house.  Pt states she doubled her clonazipam this morning, but cannot give a reason why.  Able to answer all questions correctly, but pt appears "drunk".  Per EMS, pt denies drinking, but her fridge was full of alcohol.  Bil LE swelling, redness and warmth.  Multiple bruises.  CBG  115.

## 2015-12-10 ENCOUNTER — Inpatient Hospital Stay (HOSPITAL_COMMUNITY): Payer: BLUE CROSS/BLUE SHIELD

## 2015-12-10 DIAGNOSIS — R4182 Altered mental status, unspecified: Secondary | ICD-10-CM

## 2015-12-10 LAB — BASIC METABOLIC PANEL
Anion gap: 13 (ref 5–15)
Anion gap: 10 (ref 5–15)
BUN: 20 mg/dL (ref 6–20)
BUN: 21 mg/dL — AB (ref 6–20)
CALCIUM: 13.4 mg/dL — AB (ref 8.9–10.3)
CHLORIDE: 109 mmol/L (ref 101–111)
CHLORIDE: 106 mmol/L (ref 101–111)
CO2: 21 mmol/L — AB (ref 22–32)
CO2: 22 mmol/L (ref 22–32)
CREATININE: 2.5 mg/dL — AB (ref 0.44–1.00)
Calcium: 12.7 mg/dL — ABNORMAL HIGH (ref 8.9–10.3)
Creatinine, Ser: 2.41 mg/dL — ABNORMAL HIGH (ref 0.44–1.00)
GFR calc non Af Amer: 19 mL/min — ABNORMAL LOW (ref >60)
GFR calc non Af Amer: 20 mL/min — ABNORMAL LOW (ref >60)
GFR, EST AFRICAN AMERICAN: 22 mL/min — AB (ref >60)
GFR, EST AFRICAN AMERICAN: 23 mL/min — AB (ref >60)
GLUCOSE: 108 mg/dL — AB (ref 65–99)
GLUCOSE: 192 mg/dL — AB (ref 65–99)
POTASSIUM: 3.1 mmol/L — AB (ref 3.5–5.1)
Potassium: 3.3 mmol/L — ABNORMAL LOW (ref 3.5–5.1)
SODIUM: 138 mmol/L (ref 135–145)
Sodium: 143 mmol/L (ref 135–145)

## 2015-12-10 LAB — CBC
HCT: 34.5 % — ABNORMAL LOW (ref 36.0–46.0)
HEMOGLOBIN: 11.3 g/dL — AB (ref 12.0–15.0)
MCH: 26 pg (ref 26.0–34.0)
MCHC: 32.8 g/dL (ref 30.0–36.0)
MCV: 79.3 fL (ref 78.0–100.0)
PLATELETS: 255 10*3/uL (ref 150–400)
RBC: 4.35 MIL/uL (ref 3.87–5.11)
RDW: 15.3 % (ref 11.5–15.5)
WBC: 9.4 10*3/uL (ref 4.0–10.5)

## 2015-12-10 LAB — TROPONIN I
TROPONIN I: 0.18 ng/mL — AB (ref <0.031)
Troponin I: 0.13 ng/mL — ABNORMAL HIGH (ref <0.031)
Troponin I: 0.12 ng/mL — ABNORMAL HIGH (ref <0.031)

## 2015-12-10 LAB — PHOSPHORUS: Phosphorus: 1.3 mg/dL — ABNORMAL LOW (ref 2.5–4.6)

## 2015-12-10 LAB — MAGNESIUM: Magnesium: 1.9 mg/dL (ref 1.7–2.4)

## 2015-12-10 LAB — ECHOCARDIOGRAM COMPLETE
Height: 66 in
WEIGHTICAEL: 3760 [oz_av]

## 2015-12-10 MED ORDER — HYDRALAZINE HCL 20 MG/ML IJ SOLN
20.0000 mg | Freq: Four times a day (QID) | INTRAMUSCULAR | Status: DC | PRN
  Administered 2015-12-10 – 2015-12-26 (×7): 20 mg via INTRAVENOUS
  Filled 2015-12-10 (×8): qty 1

## 2015-12-10 MED ORDER — HYDRALAZINE HCL 10 MG PO TABS
10.0000 mg | ORAL_TABLET | Freq: Three times a day (TID) | ORAL | Status: DC
  Administered 2015-12-10 – 2015-12-11 (×3): 10 mg via ORAL
  Filled 2015-12-10 (×3): qty 1

## 2015-12-10 MED ORDER — LORAZEPAM 0.5 MG PO TABS
0.5000 mg | ORAL_TABLET | Freq: Once | ORAL | Status: AC
  Administered 2015-12-10: 0.5 mg via ORAL
  Filled 2015-12-10: qty 1

## 2015-12-10 MED ORDER — MAGNESIUM SULFATE 2 GM/50ML IV SOLN
2.0000 g | Freq: Once | INTRAVENOUS | Status: AC
  Administered 2015-12-10: 2 g via INTRAVENOUS
  Filled 2015-12-10: qty 50

## 2015-12-10 MED ORDER — MAGNESIUM SULFATE 50 % IJ SOLN
2.0000 g | Freq: Once | INTRAVENOUS | Status: DC
  Administered 2015-12-10: 2 g via INTRAVENOUS

## 2015-12-10 MED ORDER — DEXTROSE 5 % IV SOLN
30.0000 mmol | Freq: Once | INTRAVENOUS | Status: AC
  Administered 2015-12-10: 30 mmol via INTRAVENOUS
  Filled 2015-12-10: qty 10

## 2015-12-10 MED ORDER — POLYVINYL ALCOHOL 1.4 % OP SOLN
1.0000 [drp] | OPHTHALMIC | Status: DC | PRN
  Administered 2015-12-10 – 2015-12-27 (×6): 1 [drp] via OPHTHALMIC
  Filled 2015-12-10: qty 15

## 2015-12-10 MED ORDER — PREDNISONE 20 MG PO TABS
20.0000 mg | ORAL_TABLET | Freq: Every day | ORAL | Status: DC
  Administered 2015-12-10 – 2015-12-25 (×16): 20 mg via ORAL
  Filled 2015-12-10 (×17): qty 1

## 2015-12-10 MED ORDER — POTASSIUM CHLORIDE CRYS ER 20 MEQ PO TBCR
40.0000 meq | EXTENDED_RELEASE_TABLET | Freq: Once | ORAL | Status: AC
  Administered 2015-12-10: 40 meq via ORAL
  Filled 2015-12-10: qty 2

## 2015-12-10 MED ORDER — HYDRALAZINE HCL 20 MG/ML IJ SOLN
10.0000 mg | Freq: Once | INTRAMUSCULAR | Status: AC
  Administered 2015-12-10: 10 mg via INTRAVENOUS

## 2015-12-10 MED ORDER — FUROSEMIDE 10 MG/ML IJ SOLN
40.0000 mg | Freq: Once | INTRAMUSCULAR | Status: AC
  Administered 2015-12-10: 40 mg via INTRAVENOUS
  Filled 2015-12-10: qty 4
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
## 2015-12-10 NOTE — Progress Notes (Signed)
[facility]                                                                              Patient Demographics  Tara [name], is a 65 y.o. female, DOB - [birth year], [id number]  Admit date - 12/09/2015   Admitting Physician Theressa Millard, MD  Outpatient Primary MD for the patient is Cathlean Cower, MD  Outpatient specialists:   LOS - 1  days    Chief Complaint  Patient presents with  . Altered Mental Status       Brief summary   Tara [name] is a 65 y.o. female with a history of Sarcoidosis, HTN, CKD who did not show up for work today and her colleagues had [employer] go to her home and she was found to be severely confused.In the ED, patient was evaluated and a CT scan of the head, Chest x-ray all were unremarkable.BMET Hypercalcemia at 14.5, and Hypokalemia at 2.3, along with a elevated BUN/Cr Of 23/2.82. She also had an elevated Troponin level at 0.16. She was referred for admission.    Assessment & Plan    Principal Problem:   Acute Metabolic encephalopathy: Likely due to profound dehydration, hypercalcemia and uremia.  -CT head negative, ammonia level 17 - Patient placed on aggressive IV fluid hydration, IV pamidronate 1, miacalcin - Calcium only slightly down, renal consult obtained, discussed with Dr. Posey Pronto  Active Problems:   Sarcoidosis [facility]) - Follows Dr. Annamaria Boots, last visit 04/2015, sarcoidosis diagnosed in 2009. Patient was hospitalized in 2014 with similar symptoms, hypercalcemia and renal insufficiency and was placed on prednisone with slow taper at that time - Obtain ACE level, likely will need to be back on prednisone, will also await renal recommendations given acute kidney injury at this time    Essential hypertension - Currently stable    Hypercalcemia: Due to underlying sarcoidosis, not on steroids prior to admission - Continue aggressive IV fluid hydration, received IV pamidronate 1, continue miacalcin    Acute on chronic  kidney failure [facility]): Due to profound dehydration and hypercalcemia, has underlying chronic kidney disease, stage IV - Continue aggressive IV fluid hydration - Hold torsemide, valsartan    UTI (urinary tract infection) - Follow urine culture, continue IV Rocephin    Hypokalemia - Replaced    Elevated troponin: Likely due to acute renal insufficiency and hypercalcemia, demand ischemia - No chest pain or shortness of breath currently, follow 2-D echo  Code Status: Full CODE STATUS  DVT Prophylaxis:  Lovenox   Family Communication: Discussed in detail with the patient, all imaging results, lab results explained to the patient    Disposition Plan:  Time Spent in minutes   25 minutes  Procedures:  CT head negative   Consultants:   Nephrology  Antimicrobials    IV Rocephin 5/26 >   Medications  Scheduled Meds: . atenolol  50 mg Oral Daily  . calcitonin (salmon)  1 spray Alternating Nares Daily  . cefTRIAXone (ROCEPHIN)  IV  1 g Intravenous Q24H  . diltiazem  240 mg Oral Daily  . enoxaparin (LOVENOX) injection  30 mg Subcutaneous Q24H  . potassium chloride  40 mEq Oral Once   Continuous Infusions: . sodium chloride 100 mL/hr at 12/10/15 0648   PRN Meds:.acetaminophen **OR** acetaminophen, hydrALAZINE, HYDROmorphone (DILAUDID) injection, ondansetron **OR** ondansetron (ZOFRAN) IV, polyvinyl alcohol   Antibiotics   Anti-infectives    Start     Dose/Rate Route Frequency Ordered Stop   12/10/15 2200  cefTRIAXone (ROCEPHIN) 1 g in dextrose 5 % 50 mL IVPB     1 g 100 mL/hr over 30 Minutes Intravenous Every 24 hours 12/09/15 2155     12/09/15 2200  cefTRIAXone (ROCEPHIN) 1 g in dextrose 5 % 50 mL IVPB     1 g 100 mL/hr over 30 Minutes Intravenous  Once 12/09/15 2148 12/09/15 2233        Subjective:   Tara [name] was seen and examined today. Somewhat confused, oriented to herself. Denies any pain. Somewhat tremulous. Denies any chest pain or shortness of  breath. No abdominal pain, nausea, vomiting or diarrhea.   difficult to obtain review of system from the patient   Objective:   Filed Vitals:   12/10/15 0300 12/10/15 0511 12/10/15 0649 12/10/15 0650  BP:  180/91 154/134 148/121  Pulse:  79 89 76  Temp:  98.2 F (36.8 C)    TempSrc:  Oral    Resp:      Height:      Weight:      SpO2: 97% 97%      Intake/Output Summary (Last 24 hours) at 12/10/15 0956 Last data filed at 12/10/15 0900  Gross per 24 hour  Intake   2075 ml  Output   1350 ml  Net    725 ml     Wt Readings from Last 3 Encounters:  12/09/15 106.595 kg (235 lb)  11/18/15 107.956 kg (238 lb)  11/01/15 107.956 kg (238 lb)     Exam  General: Alert and oriented x self, NAD confused   HEENT:  PERRLA, EOMI, Anicteric Sclera, mucous membranes moist.   Neck: Supple, no JVD, no masses  Cardiovascular: S1 S2 auscultated, no rubs, murmurs or gallops. Regular rate and rhythm.  Respiratory: Clear to auscultation bilaterally, no wheezing, rales or rhonchi  Gastrointestinal: Soft, nontender, nondistended, + bowel sounds  Ext: no cyanosis clubbing or edema, somewhat tremulous   Neuro: moving all 4 extremities   Skin: No rashes  Psych: confused    Data Reviewed:  I have personally reviewed following labs and imaging studies  Micro Results No results found for this or any previous visit (from the past 240 hour(s)).  Radiology Reports Dg Chest 2 View  12/09/2015  CLINICAL DATA:  Altered mental status. EXAM: CHEST  2 VIEW COMPARISON:  October 12, 2015. FINDINGS: The heart size and mediastinal contours are within normal limits. Both lungs are clear. No pneumothorax or pleural effusion is noted. The visualized skeletal structures are unremarkable. IMPRESSION: No active cardiopulmonary disease. Electronically Signed   By: Marijo Conception, M.D.   On: 12/09/2015 19:44   Ct Head Wo Contrast  12/09/2015  CLINICAL DATA:  Altered mental status. EXAM: CT HEAD WITHOUT  CONTRAST TECHNIQUE: Contiguous axial images were obtained from the base of the skull through the vertex without intravenous contrast. COMPARISON:  CT scan of July 23, 2012. FINDINGS: Bony calvarium appears intact. No mass effect or midline shift is noted. Ventricular size is within normal limits. There is no evidence of mass lesion, hemorrhage or acute infarction. IMPRESSION: Normal head CT. Electronically Signed   By: Marijo Conception, M.D.  On: 12/09/2015 19:55    Lab Data:  CBC:  Recent Labs Lab 12/09/15 1850 12/10/15 0555  WBC 7.3 9.4  HGB 10.8* 11.3*  HCT 32.4* 34.5*  MCV 78.6 79.3  PLT 255 [id number]   Basic Metabolic Panel:  Recent Labs Lab 12/09/15 1850 12/10/15 0015 12/10/15 0555  NA 139  --  143  K 2.3*  --  3.3*  CL 102  --  109  CO2 28  --  21*  GLUCOSE 95  --  108*  BUN 23*  --  20  CREATININE 2.82*  --  2.50*  CALCIUM 14.5*  --  13.4*  MG  --  1.9  --    GFR: Estimated Creatinine Clearance: 28.4 mL/min (by C-G formula based on Cr of 2.5). Liver Function Tests:  Recent Labs Lab 12/09/15 1850  AST 34  ALT 27  ALKPHOS 71  BILITOT 0.5  PROT 7.8  ALBUMIN 3.9   No results for input(s): LIPASE, AMYLASE in the last 168 hours.  Recent Labs Lab 12/09/15 2014  AMMONIA 17   Coagulation Profile: No results for input(s): INR, PROTIME in the last 168 hours. Cardiac Enzymes:  Recent Labs Lab 12/10/15 0015 12/10/15 0555  TROPONINI 0.13* 0.12*   BNP (last 3 results) No results for input(s): PROBNP in the last 8760 hours. HbA1C: No results for input(s): HGBA1C in the last 72 hours. CBG:  Recent Labs Lab 12/09/15 1832  GLUCAP 86   Lipid Profile: No results for input(s): CHOL, HDL, LDLCALC, TRIG, CHOLHDL, LDLDIRECT in the last 72 hours. Thyroid Function Tests: No results for input(s): TSH, T4TOTAL, FREET4, T3FREE, THYROIDAB in the last 72 hours. Anemia Panel: No results for input(s): VITAMINB12, FOLATE, FERRITIN, TIBC, IRON, RETICCTPCT in the last  72 hours. Urine analysis:    Component Value Date/Time   COLORURINE YELLOW 12/09/2015 1837   APPEARANCEUR CLOUDY* 12/09/2015 1837   LABSPEC 1.013 12/09/2015 1837   PHURINE 6.5 12/09/2015 1837   GLUCOSEU NEGATIVE 12/09/2015 1837   GLUCOSEU NEGATIVE 09/27/2015 0849   HGBUR SMALL* 12/09/2015 1837   BILIRUBINUR NEGATIVE 12/09/2015 1837   [place] 12/09/2015 1837   PROTEINUR 30* 12/09/2015 1837   UROBILINOGEN 0.2 09/27/2015 0849   NITRITE NEGATIVE 12/09/2015 1837   LEUKOCYTESUR MODERATE* 12/09/2015 1837     RAI,RIPUDEEP M.D. [facility] 12/10/2015, 9:56 AM  Pager: [phone] Between 7am to 7pm - call Pager - [phone]  After 7pm go to [email or web address] - password [email or web address]  Call night coverage person covering after 7pm

## 2015-12-10 NOTE — Progress Notes (Signed)
*  PRELIMINARY RESULTS* Echocardiogram 2D Echocardiogram has been performed.  Leavy Cella 12/10/2015, 3:54 PM

## 2015-12-10 NOTE — Progress Notes (Signed)
NURSING PROGRESS NOTE  [name]: [id number] Admission Data: 12/09/2015 at 11PM Attending Provider: Theressa Millard, MD PCP: Cathlean Cower, MD Code status: Full  Allergies:  Allergies  Allergen Reactions  . Chocolate Other (See Comments)    SOMETIMES CAUSES SEVERE HEADACHES  . Floxin [Ocuflox]     shaky  . Fluoxetine Hcl     Suicidal thoughts  . Other Nausea And Vomiting    INSTANT ICED TEA PACKETS  . Sulfonamide Derivatives Rash    Past Medical History:  Past Medical History  Diagnosis Date  . ANKLE PAIN, LEFT 04/01/2008  . ANXIETY 04/17/2007  . COLONIC POLYPS, HX OF 08/01/2007  . CONTUSIONS, MULTIPLE 04/01/2009  . DEPRESSION 04/17/2007  . DIZZINESS 08/01/2007  . DYSPNEA 08/01/2007  . Enlargement of lymph nodes 08/13/2007  . GLUCOSE INTOLERANCE 08/01/2007  . HYPERLIPIDEMIA 08/01/2007  . HYPERSOMNIA 07/28/2008  . HYPERTENSION 04/17/2007  . JOINT EFFUSION, LEFT KNEE 06/02/2010  . Loose body in knee 04/01/2009  . Morbid obesity ([place]) 04/20/2007  . OTHER DISEASES OF LUNG NOT ELSEWHERE CLASSIFIED 08/01/2007  . Pain in joint, lower leg 04/01/2009  . PERIPHERAL EDEMA 04/21/2009  . Sarcoidosis ([place]) 09/25/2007  . SHOULDER PAIN, LEFT 04/01/2009  . Impaired glucose tolerance 03/23/2011  . Migraines     "stopped 3-4 yr ago" (07/23/2012)  . 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 Chronic kidney disease     Past Surgical History:  Past Surgical History  Procedure Laterality Date  . Myomectomy  1994  . Fracture surgery  ?02/1997    "left upper arm; put rod in" (07/23/2012)  . Refractive surgery  08/1998    "both eyes" (07/23/2012)  . Combined mediastinoscopy and bronchoscopy  08/2007  . Gum surgery  2000-?2009    "several ORs; soft tissue graft; took material from roof of mouth" (07/23/2012  . Knee arthroscopy  03/2004; 06/2009    "right; left" Dr. Theda Sers  . Lymph node biopsy  ~ 2009    "for sarcoidosis; don't know exactly which nodes" (07/23/2102)  . Combined mediastinoscopy and  bronchoscopy  2009  . Breast surgery      Biopsy benign    Tara [name] is a 65 y.o. female patient, arrived to floor in room [phone] via stretcher, transferred from ED. Patient alert and oriented X 4. No acute distress noted. Denies pain.   Vital signs: Oral temperature 97.9 F (36.6 C), Blood pressure 189/72, Pulse 66, RR 18, SpO2 100 % on room air.  Cardiac monitoring: Telemetry box 5W #23 in place.  IV access: Left AC; condition patent and no redness with IV potassium infusing.  Skin: intact, no pressure ulcer noted in sacral area; Bruising noted on bilateral arms and legs and on left hip.  Patient's ID armband verified with patient and in place. Information packet given to patient. Fall risk assessed, SR up X2, patient able to verbalize understanding of risks associated with falls and to call nurse or staff to assist before getting out of bed. Patient oriented to room and equipment. Call bell within reach.

## 2015-12-11 LAB — RENAL FUNCTION PANEL
ALBUMIN: 3.4 g/dL — AB (ref 3.5–5.0)
ANION GAP: 11 (ref 5–15)
BUN: 21 mg/dL — ABNORMAL HIGH (ref 6–20)
CHLORIDE: 109 mmol/L (ref 101–111)
CO2: 20 mmol/L — ABNORMAL LOW (ref 22–32)
Calcium: 11.3 mg/dL — ABNORMAL HIGH (ref 8.9–10.3)
Creatinine, Ser: 2.3 mg/dL — ABNORMAL HIGH (ref 0.44–1.00)
GFR calc Af Amer: 25 mL/min — ABNORMAL LOW (ref >60)
GFR calc non Af Amer: 21 mL/min — ABNORMAL LOW (ref >60)
GLUCOSE: 133 mg/dL — AB (ref 65–99)
PHOSPHORUS: 2.4 mg/dL — AB (ref 2.5–4.6)
POTASSIUM: 3.6 mmol/L (ref 3.5–5.1)
Sodium: 140 mmol/L (ref 135–145)

## 2015-12-11 LAB — CBC
HEMATOCRIT: 34.2 % — AB (ref 36.0–46.0)
HEMOGLOBIN: 11.3 g/dL — AB (ref 12.0–15.0)
MCH: 26.6 pg (ref 26.0–34.0)
MCHC: 33 g/dL (ref 30.0–36.0)
MCV: 80.5 fL (ref 78.0–100.0)
Platelets: 262 10*3/uL (ref 150–400)
RBC: 4.25 MIL/uL (ref 3.87–5.11)
RDW: 16.1 % — ABNORMAL HIGH (ref 11.5–15.5)
WBC: 16.6 10*3/uL — ABNORMAL HIGH (ref 4.0–10.5)

## 2015-12-11 LAB — PARATHYROID HORMONE, INTACT (NO CA): PTH: 13 pg/mL — ABNORMAL LOW (ref 15–65)

## 2015-12-11 LAB — ANGIOTENSIN CONVERTING ENZYME: Angiotensin-Converting Enzyme: 90 U/L — ABNORMAL HIGH (ref 14–82)

## 2015-12-11 MED ORDER — POTASSIUM CHLORIDE CRYS ER 20 MEQ PO TBCR
20.0000 meq | EXTENDED_RELEASE_TABLET | Freq: Two times a day (BID) | ORAL | Status: AC
Start: 2015-12-11 — End: 2015-12-11
  Administered 2015-12-11 (×2): 20 meq via ORAL
  Filled 2015-12-11 (×2): qty 1

## 2015-12-11 MED ORDER — CLONIDINE HCL 0.1 MG PO TABS
0.1000 mg | ORAL_TABLET | Freq: Once | ORAL | Status: AC
  Administered 2015-12-11: 0.1 mg via ORAL
  Filled 2015-12-11: qty 1

## 2015-12-11 MED ORDER — ENOXAPARIN SODIUM 40 MG/0.4ML ~~LOC~~ SOLN
40.0000 mg | SUBCUTANEOUS | Status: DC
  Administered 2015-12-11 – 2015-12-26 (×16): 40 mg via SUBCUTANEOUS
  Filled 2015-12-11 (×16): qty 0.4

## 2015-12-11 MED ORDER — FUROSEMIDE 10 MG/ML IJ SOLN
40.0000 mg | Freq: Once | INTRAMUSCULAR | Status: AC
  Administered 2015-12-11: 40 mg via INTRAVENOUS
  Filled 2015-12-11: qty 4

## 2015-12-11 MED ORDER — HYDRALAZINE HCL 25 MG PO TABS
25.0000 mg | ORAL_TABLET | Freq: Three times a day (TID) | ORAL | Status: DC
  Administered 2015-12-11 – 2015-12-12 (×3): 25 mg via ORAL
  Filled 2015-12-11 (×3): qty 1

## 2015-12-11 NOTE — Progress Notes (Signed)
pt blood pressure this morning is 187/103. I have given scheduled and PRN hydralazine throughout the night without any significant drop in BP. Text paged T.Rogue Bussing. No new orders at this time

## 2015-12-11 NOTE — Progress Notes (Signed)
Pharmacy - enoxaparin dose  Body mass index is 37.95 kg/(m^2). CrCl ~31 ml/min  Adjusted enoxaparin doe to 40 mg SQ q24h for BMI >30 and borderline CrCl  [facility], Pharm.D., BCPS Clinical Pharmacist Pager: [phone] 12/11/2015 2:40 PM

## 2015-12-11 NOTE — Progress Notes (Signed)
[facility] Progress Note   Assessment/ Plan:   1. Hypercalcemia: This appears most consistent with her sarcoidosis. PTH, 25 and 1,25-hydroxy vitamin D Ievels pending. Calcium level improving status post intravenous bisphosphonate given in the emergency room, volume expansion/forced diuresis and now prednisone. Mental status improving but not yet back to baseline . 2. Acute renal failure on chronic kidney disease stage III-IV: Most likely hemodynamically mediated from hypercalcemia with attendant polyuria/volume contraction with ongoing diuretic and ARB use - renal function initially better with intravenous fluids however, unchanged overnight. Will decrease rate of IV fluids. 3. Anemia: Suspect that hemoglobin level is artificially elevated by hemoconcentration/volume contraction from hypercalcemia. We'll continue to follow hemoglobin trend with volume expansion. She has not required ESA so far. 4. Hypokalemia: Likely secondary to polyuria from hypercalcemia with limited oral intake on account of altered mental status- will replace cautiously via oral route. 5. Hypertension: Likely worsened by ongoing intravenous normal saline, on atenolol/diltiazem-elevated blood pressures noted overnight. Decrease the rate of normal saline to limit salt load. Repeat dose furosemide.  Subjective:   Reports she feels somewhat better and denies any chest pain or shortness of breath. Her sister from [place] who spoke to me on the phone states that Mr. Madyun is still having confusion and word finding difficulty which I agree with.    Objective:   BP 189/106 mmHg  Pulse 81  Temp(Src) 97.6 F (36.4 C) (Oral)  Resp 20  Ht 5\' 6"  (1.676 m)  Wt 106.595 kg (235 lb)  BMI 37.95 kg/m2  SpO2 96%  Intake/Output Summary (Last 24 hours) at 12/11/15 1106 Last data filed at 12/11/15 1000  Gross per 24 hour  Intake 3127.25 ml  Output   1550 ml  Net 1577.25 ml   Weight change:   Physical  Exam: [id number], alert, still intermittently confused CVS: Pulse regular rhythm, S1 and S2 with ESM Resp: Decreased breath sounds bases without rales. Intermittent expiratory rhonchi audible Abd: Soft, obese, nontender Ext: 1+ edema over lower extremities  Imaging: Dg Chest 2 View  12/09/2015  CLINICAL DATA:  Altered mental status. EXAM: CHEST  2 VIEW COMPARISON:  October 12, 2015. FINDINGS: The heart size and mediastinal contours are within normal limits. Both lungs are clear. No pneumothorax or pleural effusion is noted. The visualized skeletal structures are unremarkable. IMPRESSION: No active cardiopulmonary disease. Electronically Signed   By: Marijo Conception, M.D.   On: 12/09/2015 19:44   Ct Head Wo Contrast  12/09/2015  CLINICAL DATA:  Altered mental status. EXAM: CT HEAD WITHOUT CONTRAST TECHNIQUE: Contiguous axial images were obtained from the base of the skull through the vertex without intravenous contrast. COMPARISON:  CT scan of July 23, 2012. FINDINGS: Bony calvarium appears intact. No mass effect or midline shift is noted. Ventricular size is within normal limits. There is no evidence of mass lesion, hemorrhage or acute infarction. IMPRESSION: Normal head CT. Electronically Signed   By: Marijo Conception, M.D.   On: 12/09/2015 19:55    Labs: BMET  Recent Labs Lab 12/09/15 1850 12/10/15 0555 12/10/15 1201 12/10/15 1603 12/11/15 0453  NA 139 143  --  138 140  K 2.3* 3.3*  --  3.1* 3.6  CL 102 109  --  106 109  CO2 28 21*  --  22 20*  GLUCOSE 95 108*  --  192* 133*  BUN 23* 20  --  21* 21*  CREATININE 2.82* 2.50*  --  2.41* 2.30*  CALCIUM 14.5* 13.4*  --  12.7* 11.3*  PHOS  --   --  1.3*  --  2.4*   CBC  Recent Labs Lab 12/09/15 1850 12/10/15 0555 12/11/15 0453  WBC 7.3 9.4 16.6*  HGB 10.8* 11.3* 11.3*  HCT 32.4* 34.5* 34.2*  MCV 78.6 79.3 80.5  PLT 255 255 262    Medications:    . atenolol  50 mg Oral Daily  . calcitonin (salmon)  1 spray Alternating  Nares Daily  . cefTRIAXone (ROCEPHIN)  IV  1 g Intravenous Q24H  . diltiazem  240 mg Oral Daily  . enoxaparin (LOVENOX) injection  30 mg Subcutaneous Q24H  . hydrALAZINE  25 mg Oral Q8H  . predniSONE  20 mg Oral Q breakfast   Elmarie Shiley, MD 12/11/2015, 11:06 AM

## 2015-12-11 NOTE — Progress Notes (Signed)
[facility]                                                                              Patient Demographics  Tara [name], is a 65 y.o. female, DOB - Apr 17, 1951, [id number]  Admit date - 12/09/2015   Admitting Physician Theressa Millard, MD  Outpatient Primary MD for the patient is Cathlean Cower, MD  Outpatient specialists:   LOS - 2  days    Chief Complaint  Patient presents with  . Altered Mental Status       Brief summary   Tara [name] is a 65 y.o. female with a history of Sarcoidosis, HTN, CKD who did not show up for work today and her colleagues had [employer] go to her home and she was found to be severely confused.In the ED, patient was evaluated and a CT scan of the head, Chest x-ray all were unremarkable.BMET Hypercalcemia at 14.5, and Hypokalemia at 2.3, along with a elevated BUN/Cr Of 23/2.82. She also had an elevated Troponin level at 0.16. She was referred for admission.    Assessment & Plan    Principal Problem:   Acute Metabolic encephalopathy: Likely due to profound dehydration, hypercalcemia and uremia.  - CT head negative, ammonia level 17 - Patient placed on aggressive IV fluid hydration, IV pamidronate 1, miacalcin - Calcium trending down, appreciate nephrology recommendations  Active Problems:   Sarcoidosis [facility]) - Follows Dr. Annamaria Boots, last visit 04/2015, sarcoidosis diagnosed in 2009. Patient was hospitalized in 2014 with similar symptoms, hypercalcemia and renal insufficiency and was placed on prednisone with slow taper at that time - Continue prednisone    Essential hypertension - Currently stable    Hypercalcemia: Due to underlying sarcoidosis, not on steroids prior to admission - Continue aggressive IV fluid hydration, received IV pamidronate 1, continue miacalcin - Started prednisone, follow PTH RP, PTH, vitamin D levels, ACE     Acute on chronic kidney failure ([place]): Due to profound dehydration and hypercalcemia,  has underlying chronic kidney disease, stage IV - Continue aggressive IV fluid hydration - Hold torsemide, valsartan  Escherichia coli  UTI (urinary tract infection) - Urine culture showing Escherichia coli, continue IV Rocephin    Elevated troponin: Likely due to acute renal insufficiency and hypercalcemia, demand ischemia - No chest pain or shortness of breath currently - 2-D echo showed EF of 60-65% with normal wall motion  Code Status: Full CODE STATUS  DVT Prophylaxis:  Lovenox   Family Communication: Discussed in detail with the patient, all imaging results, lab results explained to the patient    Disposition Plan:  Time Spent in minutes   25 minutes  Procedures:  CT head negative   Consultants:   Nephrology  Antimicrobials    IV Rocephin 5/26 >   Medications  Scheduled Meds: . atenolol  50 mg Oral Daily  . calcitonin (salmon)  1 spray Alternating Nares Daily  . cefTRIAXone (ROCEPHIN)  IV  1 g Intravenous Q24H  . diltiazem  240 mg Oral Daily  . enoxaparin (LOVENOX) injection  30 mg Subcutaneous Q24H  . hydrALAZINE  25 mg Oral Q8H  . predniSONE  20  mg Oral Q breakfast   Continuous Infusions: . sodium chloride 150 mL/hr at 12/11/15 0152   PRN Meds:.acetaminophen **OR** acetaminophen, hydrALAZINE, HYDROmorphone (DILAUDID) injection, ondansetron **OR** ondansetron (ZOFRAN) IV, polyvinyl alcohol   Antibiotics   Anti-infectives    Start     Dose/Rate Route Frequency Ordered Stop   12/10/15 2200  cefTRIAXone (ROCEPHIN) 1 g in dextrose 5 % 50 mL IVPB     1 g 100 mL/hr over 30 Minutes Intravenous Every 24 hours 12/09/15 2155     12/09/15 2200  cefTRIAXone (ROCEPHIN) 1 g in dextrose 5 % 50 mL IVPB     1 g 100 mL/hr over 30 Minutes Intravenous  Once 12/09/15 2148 12/09/15 2233        Subjective:   Tara [name] was seen and examined today. Still somewhat confused. Tremulousness is improving. Denies any chest pain or shortness of breath. No abdominal  pain, nausea, vomiting or diarrhea.   Objective:   Filed Vitals:   12/10/15 2110 12/11/15 0006 12/11/15 0415 12/11/15 0650  BP: 187/83 188/83 187/103 189/106  Pulse: 77 71 85 81  Temp: 99 F (37.2 C) 98.6 F (37 C) 97.6 F (36.4 C)   TempSrc:      Resp: 17 18 20    Height:      Weight:      SpO2: 97% 100% 96%     Intake/Output Summary (Last 24 hours) at 12/11/15 1041 Last data filed at 12/11/15 1000  Gross per 24 hour  Intake 3127.25 ml  Output   1550 ml  Net 1577.25 ml     Wt Readings from Last 3 Encounters:  12/09/15 106.595 kg (235 lb)  11/18/15 107.956 kg (238 lb)  11/01/15 107.956 kg (238 lb)     Exam  General: Alert and oriented x self, NAD confused   HEENT:   Neck:   Cardiovascular: S1 S2 auscultated, no rubs, murmurs or gallops. Regular rate and rhythm.  Respiratory:Mild bilateral expiratory wheezing  Gastrointestinal: Soft, nontender, nondistended, + bowel sounds  Ext: no cyanosis clubbing or edema, somewhat tremulous   Neuro: moving all 4 extremities   Skin: No rashes  Psych: confused    Data Reviewed:  I have personally reviewed following labs and imaging studies  Micro Results Recent Results (from the past 240 hour(s))  Urine culture     Status: Abnormal (Preliminary result)   Collection Time: 12/09/15  6:37 PM  Result Value Ref Range Status   Specimen Description URINE, RANDOM  Final   Special Requests NONE  Final   Culture >=100,000 COLONIES/mL ESCHERICHIA COLI (A)  Final   Report Status PENDING  Incomplete    Radiology Reports Dg Chest 2 View  12/09/2015  CLINICAL DATA:  Altered mental status. EXAM: CHEST  2 VIEW COMPARISON:  October 12, 2015. FINDINGS: The heart size and mediastinal contours are within normal limits. Both lungs are clear. No pneumothorax or pleural effusion is noted. The visualized skeletal structures are unremarkable. IMPRESSION: No active cardiopulmonary disease. Electronically Signed   By: Marijo Conception, M.D.    On: 12/09/2015 19:44   Ct Head Wo Contrast  12/09/2015  CLINICAL DATA:  Altered mental status. EXAM: CT HEAD WITHOUT CONTRAST TECHNIQUE: Contiguous axial images were obtained from the base of the skull through the vertex without intravenous contrast. COMPARISON:  CT scan of July 23, 2012. FINDINGS: Bony calvarium appears intact. No mass effect or midline shift is noted. Ventricular size is within normal limits. There is no evidence of mass  lesion, hemorrhage or acute infarction. IMPRESSION: Normal head CT. Electronically Signed   By: Marijo Conception, M.D.   On: 12/09/2015 19:55    Lab Data:  CBC:  Recent Labs Lab 12/09/15 1850 12/10/15 0555 12/11/15 0453  WBC 7.3 9.4 16.6*  HGB 10.8* 11.3* 11.3*  HCT 32.4* 34.5* 34.2*  MCV 78.6 79.3 80.5  PLT 255 255 [id number]   Basic Metabolic Panel:  Recent Labs Lab 12/09/15 1850 12/10/15 0015 12/10/15 0555 12/10/15 1201 12/10/15 1603 12/11/15 0453  NA 139  --  143  --  138 140  K 2.3*  --  3.3*  --  3.1* 3.6  CL 102  --  109  --  106 109  CO2 28  --  21*  --  22 20*  GLUCOSE 95  --  108*  --  192* 133*  BUN 23*  --  20  --  21* 21*  CREATININE 2.82*  --  2.50*  --  2.41* 2.30*  CALCIUM 14.5*  --  13.4*  --  12.7* 11.3*  MG  --  1.9  --   --   --   --   PHOS  --   --   --  1.3*  --  2.4*   GFR: Estimated Creatinine Clearance: 30.9 mL/min (by C-G formula based on Cr of 2.3). Liver Function Tests:  Recent Labs Lab 12/09/15 1850 12/11/15 0453  AST 34  --   ALT 27  --   ALKPHOS 71  --   BILITOT 0.5  --   PROT 7.8  --   ALBUMIN 3.9 3.4*   No results for input(s): LIPASE, AMYLASE in the last 168 hours.  Recent Labs Lab 12/09/15 2014  AMMONIA 17   Coagulation Profile: No results for input(s): INR, PROTIME in the last 168 hours. Cardiac Enzymes:  Recent Labs Lab 12/10/15 0015 12/10/15 0555 12/10/15 1105  TROPONINI 0.13* 0.12* 0.18*   BNP (last 3 results) No results for input(s): PROBNP in the last 8760  hours. HbA1C: No results for input(s): HGBA1C in the last 72 hours. CBG:  Recent Labs Lab 12/09/15 1832  GLUCAP 86   Lipid Profile: No results for input(s): CHOL, HDL, LDLCALC, TRIG, CHOLHDL, LDLDIRECT in the last 72 hours. Thyroid Function Tests: No results for input(s): TSH, T4TOTAL, FREET4, T3FREE, THYROIDAB in the last 72 hours. Anemia Panel: No results for input(s): VITAMINB12, FOLATE, FERRITIN, TIBC, IRON, RETICCTPCT in the last 72 hours. Urine analysis:    Component Value Date/Time   COLORURINE YELLOW 12/09/2015 1837   APPEARANCEUR CLOUDY* 12/09/2015 1837   LABSPEC 1.013 12/09/2015 1837   PHURINE 6.5 12/09/2015 1837   GLUCOSEU NEGATIVE 12/09/2015 1837   GLUCOSEU NEGATIVE 09/27/2015 0849   HGBUR SMALL* 12/09/2015 1837   BILIRUBINUR NEGATIVE 12/09/2015 1837   [place] 12/09/2015 1837   PROTEINUR 30* 12/09/2015 1837   UROBILINOGEN 0.2 09/27/2015 0849   NITRITE NEGATIVE 12/09/2015 1837   LEUKOCYTESUR MODERATE* 12/09/2015 1837     RAI,RIPUDEEP M.D. [facility] 12/11/2015, 10:41 AM  Pager: [phone] Between 7am to 7pm - call Pager - [phone]  After 7pm go to [email or web address] - password [email or web address]  Call night coverage person covering after 7pm

## 2015-12-12 ENCOUNTER — Inpatient Hospital Stay (HOSPITAL_COMMUNITY): Payer: BLUE CROSS/BLUE SHIELD

## 2015-12-12 LAB — CBC
HEMATOCRIT: 33.8 % — AB (ref 36.0–46.0)
HEMOGLOBIN: 10.7 g/dL — AB (ref 12.0–15.0)
MCH: 26.2 pg (ref 26.0–34.0)
MCHC: 31.7 g/dL (ref 30.0–36.0)
MCV: 82.6 fL (ref 78.0–100.0)
Platelets: 253 10*3/uL (ref 150–400)
RBC: 4.09 MIL/uL (ref 3.87–5.11)
RDW: 16.7 % — ABNORMAL HIGH (ref 11.5–15.5)
WBC: 14.2 10*3/uL — ABNORMAL HIGH (ref 4.0–10.5)

## 2015-12-12 LAB — RENAL FUNCTION PANEL
ANION GAP: 11 (ref 5–15)
Albumin: 3.2 g/dL — ABNORMAL LOW (ref 3.5–5.0)
BUN: 22 mg/dL — ABNORMAL HIGH (ref 6–20)
CALCIUM: 10.3 mg/dL (ref 8.9–10.3)
CO2: 19 mmol/L — AB (ref 22–32)
Chloride: 109 mmol/L (ref 101–111)
Creatinine, Ser: 2.3 mg/dL — ABNORMAL HIGH (ref 0.44–1.00)
GFR calc non Af Amer: 21 mL/min — ABNORMAL LOW (ref >60)
GFR, EST AFRICAN AMERICAN: 25 mL/min — AB (ref >60)
Glucose, Bld: 92 mg/dL (ref 65–99)
PHOSPHORUS: 1.5 mg/dL — AB (ref 2.5–4.6)
Potassium: 3.4 mmol/L — ABNORMAL LOW (ref 3.5–5.1)
SODIUM: 139 mmol/L (ref 135–145)

## 2015-12-12 LAB — URINE CULTURE

## 2015-12-12 LAB — CALCITRIOL (1,25 DI-OH VIT D): Vit D, 1,25-Dihydroxy: 58.6 pg/mL (ref 19.9–79.3)

## 2015-12-12 MED ORDER — SODIUM BICARBONATE 650 MG PO TABS
1300.0000 mg | ORAL_TABLET | Freq: Two times a day (BID) | ORAL | Status: DC
  Administered 2015-12-12 – 2015-12-27 (×31): 1300 mg via ORAL
  Filled 2015-12-12 (×31): qty 2

## 2015-12-12 MED ORDER — CLONIDINE HCL 0.1 MG PO TABS
0.1000 mg | ORAL_TABLET | Freq: Two times a day (BID) | ORAL | Status: DC
  Administered 2015-12-12 – 2015-12-27 (×31): 0.1 mg via ORAL
  Filled 2015-12-12 (×31): qty 1

## 2015-12-12 MED ORDER — HYDRALAZINE HCL 50 MG PO TABS
50.0000 mg | ORAL_TABLET | Freq: Three times a day (TID) | ORAL | Status: DC
  Administered 2015-12-12 – 2015-12-15 (×8): 50 mg via ORAL
  Filled 2015-12-12 (×8): qty 1

## 2015-12-12 MED ORDER — CLONIDINE HCL 0.1 MG PO TABS
0.1000 mg | ORAL_TABLET | Freq: Once | ORAL | Status: AC
  Administered 2015-12-12: 0.1 mg via ORAL
  Filled 2015-12-12: qty 1

## 2015-12-12 MED ORDER — POTASSIUM CHLORIDE CRYS ER 20 MEQ PO TBCR
40.0000 meq | EXTENDED_RELEASE_TABLET | Freq: Two times a day (BID) | ORAL | Status: AC
  Administered 2015-12-12 (×2): 40 meq via ORAL
  Filled 2015-12-12 (×2): qty 2

## 2015-12-12 MED ORDER — FUROSEMIDE 80 MG PO TABS
80.0000 mg | ORAL_TABLET | Freq: Two times a day (BID) | ORAL | Status: DC
  Administered 2015-12-12 – 2015-12-13 (×3): 80 mg via ORAL
  Filled 2015-12-12 (×3): qty 1

## 2015-12-12 NOTE — Progress Notes (Signed)
[facility]                                                                              Patient Demographics  Tara [name], is a 65 y.o. female, DOB - 1951/04/02, [id number]  Admit date - 12/09/2015   Admitting Physician Ron Parker, MD  Outpatient Primary MD for the patient is Oliver Barre, MD  Outpatient specialists:   LOS - 3  days    Chief Complaint  Patient presents with  . Altered Mental Status       Brief summary   Tara [name] is a 65 y.o. female with a history of Sarcoidosis, HTN, CKD who did not show up for work today and her colleagues had [employer] go to her home and she was found to be severely confused.In the ED, patient was evaluated and a CT scan of the head, Chest x-ray all were unremarkable.BMET Hypercalcemia at 14.5, and Hypokalemia at 2.3, along with a elevated BUN/Cr Of 23/2.82. She also had an elevated Troponin level at 0.16. She was referred for admission.    Assessment & Plan    Principal Problem:   Acute Metabolic encephalopathy: Likely due to profound dehydration, hypercalcemia and uremia. - Significantly improving today, much more alert and oriented - CT head negative, ammonia level 17 - Patient was cplaced on aggressive IV fluid hydration, IV pamidronate 1, miacalcin - Calcium trending down, appreciate nephrology recommendations - Still slurring her speech, tangential speech although she is much more alert and oriented and appropriately answering many questions, rule out any underlying CVA   Active Problems:   Sarcoidosis ([facility]) - Follows Dr. Maple Hudson, last visit 04/2015, sarcoidosis diagnosed in 2009. Patient was hospitalized in 2014 with similar symptoms, hypercalcemia and renal insufficiency and was placed on prednisone with slow taper at that time - Continue prednisone, will make pulmonology follow-up prior to discharge  Uncontrolled hypertension - Continue beta blocker, added hydralazine, clonidine   Hypercalcemia: Due to underlying sarcoidosis, not on steroids prior to admission - Patient received aggressive IV fluid hydration, received IV pamidronate 1, continue miacalcin - Started prednisone - PTH appropriately low 13, vitamin D levels 1,25  58.6, PTH RP pending - ACE level high, 90 will need to stay on prednisone     Acute on chronic kidney failure ([facility]): Due to profound dehydration and hypercalcemia, has underlying chronic kidney disease, stage IV - Creatinine appears to be at baseline, plateaued at 2.3 - Currently on Lasix, renal following  Escherichia coli  UTI (urinary tract infection) - Urine culture showing Escherichia coli, continue IV Rocephin    Elevated troponin: Likely due to acute renal insufficiency and hypercalcemia, demand ischemia - No chest pain or shortness of breath currently - 2-D echo showed EF of 60-65% with normal wall motion  Generalized debility - PTOT evaluation, increase ambulation, out of bed  Code Status: Full CODE STATUS  DVT Prophylaxis:  Lovenox   Family Communication: Discussed in detail with the patient, all imaging results, lab results explained to the patient    Disposition Plan: Hopefully DC in 24-48 hours  Time Spent in minutes   25 minutes  Procedures:  CT  head negative   Consultants:   Nephrology  Antimicrobials    IV Rocephin 5/26 >   Medications  Scheduled Meds: . atenolol  50 mg Oral Daily  . calcitonin (salmon)  1 spray Alternating Nares Daily  . cefTRIAXone (ROCEPHIN)  IV  1 g Intravenous Q24H  . cloNIDine  0.1 mg Oral BID  . diltiazem  240 mg Oral Daily  . enoxaparin (LOVENOX) injection  40 mg Subcutaneous Q24H  . furosemide  80 mg Oral BID  . hydrALAZINE  50 mg Oral Q8H  . potassium chloride  40 mEq Oral BID  . predniSONE  20 mg Oral Q breakfast   Continuous Infusions:   PRN Meds:.acetaminophen **OR** acetaminophen, hydrALAZINE, HYDROmorphone (DILAUDID) injection, ondansetron **OR** ondansetron  (ZOFRAN) IV, polyvinyl alcohol   Antibiotics   Anti-infectives    Start     Dose/Rate Route Frequency Ordered Stop   12/10/15 2200  cefTRIAXone (ROCEPHIN) 1 g in dextrose 5 % 50 mL IVPB     1 g 100 mL/hr over 30 Minutes Intravenous Every 24 hours 12/09/15 2155     12/09/15 2200  cefTRIAXone (ROCEPHIN) 1 g in dextrose 5 % 50 mL IVPB     1 g 100 mL/hr over 30 Minutes Intravenous  Once 12/09/15 2148 12/09/15 2233        Subjective:   Tara [name] was seen and examined today. Much more alert and awake and oriented today, still has slurred speech and word finding difficulty. Denies any chest pain or shortness of breath. No abdominal pain, nausea, vomiting or diarrhea.   Objective:   Filed Vitals:   12/12/15 0019 12/12/15 0210 12/12/15 0415 12/12/15 0628  BP: 179/77 171/80 173/70 185/80  Pulse: 70 72 73 70  Temp: 97.9 F (36.6 C)  99.1 F (37.3 C)   TempSrc: Oral  Oral   Resp: 18 18 18    Height:      Weight:      SpO2: 100% 100% 96%     Intake/Output Summary (Last 24 hours) at 12/12/15 1029 Last data filed at 12/11/15 1300  Gross per 24 hour  Intake 566.25 ml  Output      0 ml  Net 566.25 ml     Wt Readings from Last 3 Encounters:  12/09/15 106.595 kg (235 lb)  11/18/15 107.956 kg (238 lb)  11/01/15 107.956 kg (238 lb)     Exam  General: Alert and oriented x 3, nad, slurred speech  HEENT:   Neck:   Cardiovascular: S1 S2 Clear, RRR  Respiratory:  Decreased breath sounds at the bases  Gastrointestinal: Soft, nontender, nondistended, + bowel sounds  Ext: no cyanosis clubbing or edema, somewhat tremulous   Neuro: slurred speech but strength 5/5 in upper and lower extremities  Skin: No rashes  Psych: alert and oriented   Data Reviewed:  I have personally reviewed following labs and imaging studies  Micro Results Recent Results (from the past 240 hour(s))  Urine culture     Status: Abnormal   Collection Time: 12/09/15  6:37 PM  Result Value Ref  Range Status   Specimen Description URINE, RANDOM  Final   Special Requests NONE  Final   Culture >=100,000 COLONIES/mL ESCHERICHIA COLI (A)  Final   Report Status 12/12/2015 FINAL  Final   Organism ID, Bacteria ESCHERICHIA COLI (A)  Final      Susceptibility   Escherichia coli - MIC*    AMPICILLIN <=2 SENSITIVE Sensitive     CEFAZOLIN <=4 SENSITIVE Sensitive  CEFTRIAXONE <=1 SENSITIVE Sensitive     CIPROFLOXACIN <=0.25 SENSITIVE Sensitive     GENTAMICIN <=1 SENSITIVE Sensitive     IMIPENEM <=0.25 SENSITIVE Sensitive     NITROFURANTOIN <=16 SENSITIVE Sensitive     TRIMETH/SULFA <=20 SENSITIVE Sensitive     AMPICILLIN/SULBACTAM <=2 SENSITIVE Sensitive     PIP/TAZO <=4 SENSITIVE Sensitive     * >=100,000 COLONIES/mL ESCHERICHIA COLI    Radiology Reports Dg Chest 2 View  12/09/2015  CLINICAL DATA:  Altered mental status. EXAM: CHEST  2 VIEW COMPARISON:  October 12, 2015. FINDINGS: The heart size and mediastinal contours are within normal limits. Both lungs are clear. No pneumothorax or pleural effusion is noted. The visualized skeletal structures are unremarkable. IMPRESSION: No active cardiopulmonary disease. Electronically Signed   By: Lupita Raider, M.D.   On: 12/09/2015 19:44   Ct Head Wo Contrast  12/09/2015  CLINICAL DATA:  Altered mental status. EXAM: CT HEAD WITHOUT CONTRAST TECHNIQUE: Contiguous axial images were obtained from the base of the skull through the vertex without intravenous contrast. COMPARISON:  CT scan of July 23, 2012. FINDINGS: Bony calvarium appears intact. No mass effect or midline shift is noted. Ventricular size is within normal limits. There is no evidence of mass lesion, hemorrhage or acute infarction. IMPRESSION: Normal head CT. Electronically Signed   By: Lupita Raider, M.D.   On: 12/09/2015 19:55    Lab Data:  CBC:  Recent Labs Lab 12/09/15 1850 12/10/15 0555 12/11/15 0453 12/12/15 0440  WBC 7.3 9.4 16.6* 14.2*  HGB 10.8* 11.3* 11.3*  10.7*  HCT 32.4* 34.5* 34.2* 33.8*  MCV 78.6 79.3 80.5 82.6  PLT 255 255 262 253   Basic Metabolic Panel:  Recent Labs Lab 12/09/15 1850 12/10/15 0015 12/10/15 0555 12/10/15 1201 12/10/15 1603 12/11/15 0453 12/12/15 0440  NA 139  --  143  --  138 140 139  K 2.3*  --  3.3*  --  3.1* 3.6 3.4*  CL 102  --  109  --  106 109 109  CO2 28  --  21*  --  22 20* 19*  GLUCOSE 95  --  108*  --  192* 133* 92  BUN 23*  --  20  --  21* 21* 22*  CREATININE 2.82*  --  2.50*  --  2.41* 2.30* 2.30*  CALCIUM 14.5*  --  13.4*  --  12.7* 11.3* 10.3  MG  --  1.9  --   --   --   --   --   PHOS  --   --   --  1.3*  --  2.4* 1.5*   GFR: Estimated Creatinine Clearance: 30.9 mL/min (by C-G formula based on Cr of 2.3). Liver Function Tests:  Recent Labs Lab 12/09/15 1850 12/11/15 0453 12/12/15 0440  AST 34  --   --   ALT 27  --   --   ALKPHOS 71  --   --   BILITOT 0.5  --   --   PROT 7.8  --   --   ALBUMIN 3.9 3.4* 3.2*   No results for input(s): LIPASE, AMYLASE in the last 168 hours.  Recent Labs Lab 12/09/15 2014  AMMONIA 17   Coagulation Profile: No results for input(s): INR, PROTIME in the last 168 hours. Cardiac Enzymes:  Recent Labs Lab 12/10/15 0015 12/10/15 0555 12/10/15 1105  TROPONINI 0.13* 0.12* 0.18*   BNP (last 3 results) No results for input(s): PROBNP in  the last 8760 hours. HbA1C: No results for input(s): HGBA1C in the last 72 hours. CBG:  Recent Labs Lab 12/09/15 1832  GLUCAP 86   Lipid Profile: No results for input(s): CHOL, HDL, LDLCALC, TRIG, CHOLHDL, LDLDIRECT in the last 72 hours. Thyroid Function Tests: No results for input(s): TSH, T4TOTAL, FREET4, T3FREE, THYROIDAB in the last 72 hours. Anemia Panel: No results for input(s): VITAMINB12, FOLATE, FERRITIN, TIBC, IRON, RETICCTPCT in the last 72 hours. Urine analysis:    Component Value Date/Time   COLORURINE YELLOW 12/09/2015 1837   APPEARANCEUR CLOUDY* 12/09/2015 1837   LABSPEC 1.013  12/09/2015 1837   PHURINE 6.5 12/09/2015 1837   GLUCOSEU NEGATIVE 12/09/2015 1837   GLUCOSEU NEGATIVE 09/27/2015 0849   HGBUR SMALL* 12/09/2015 1837   BILIRUBINUR NEGATIVE 12/09/2015 1837   KETONESUR NEGATIVE 12/09/2015 1837   PROTEINUR 30* 12/09/2015 1837   UROBILINOGEN 0.2 09/27/2015 0849   NITRITE NEGATIVE 12/09/2015 1837   LEUKOCYTESUR MODERATE* 12/09/2015 1837     Tara [name] M.D. [facility] 12/12/2015, 10:29 AM  Pager: [phone] Between 7am to 7pm - call Pager - [phone]  After 7pm go to [email or web address] - password [email or web address]  Call night coverage person covering after 7pm

## 2015-12-12 NOTE — Evaluation (Signed)
Physical Therapy Evaluation Patient Details Name: Tara [name] MRN: [id number] DOB: Feb 20, 1951 Today's Date: 12/12/2015   History of Present Illness  Patient is a 65 y/o female with hx of chronic kidney failure, hx sarcoidosis, DM, chronic knee pain/stability presents with confusion. Found to have metabolic encephalopathy- due to Hypercalcemia and Uremia.  Clinical Impression  Patient presents with confusion, tangential speech, reported hallucinations, vision deficits, dyspnea on exertion, difficulty using the phone/call bell and impaired balance/mobility. Tolerated gait training with Min A and RW for support for safety. Pt with DOE. Difficult to obtain history/PLOF due to expressive deficits? 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 Not safe to return home. Will follow acutely to maximize independence and mobility prior to return home.    Follow Up Recommendations SNF    Equipment Recommendations  None recommended by PT    Recommendations for Other Services OT consult     Precautions / Restrictions Precautions Precautions: Fall Restrictions Weight Bearing Restrictions: No      Mobility  Bed Mobility               General bed mobility comments: Sitting EOB upon PT arrival.  Transfers Overall transfer level: Needs assistance Equipment used: Rolling walker (2 wheeled) Transfers: Sit to/from Stand Sit to Stand: Min guard         General transfer comment: Min guard for safety. Slow to stand.   Ambulation/Gait Ambulation/Gait assistance: Min assist Ambulation Distance (Feet): 150 Feet Assistive device: Rolling walker (2 wheeled) Gait Pattern/deviations: Step-through pattern;Decreased stride length;Trunk flexed   Gait velocity interpretation: at or above normal speed for age/gender General Gait Details: Increased gait speed. Cues for RW proximity.  3/4 DOE.  Stairs            Wheelchair Mobility     Modified Rankin (Stroke Patients Only)       Balance Overall balance assessment: Needs assistance Sitting-balance support: Feet supported;No upper extremity supported Sitting balance-Leahy Scale: Good     Standing balance support: During functional activity Standing balance-Leahy Scale: Poor Standing balance comment: Reliant on BUEs for support.                              Pertinent Vitals/Pain Pain Assessment: No/denies pain    Home Living Family/patient expects to be discharged to:: Private residence Living Arrangements: Alone   Type of Home: House Home Access: Ramped entrance     Home Layout: One level Home Equipment: Environmental consultant - 2 wheels;Cane - single point      Prior Function Level of Independence: Independent         Comments: Optician, dispensing Dominance   Dominant Hand: Right    Extremity/Trunk Assessment   Upper Extremity Assessment: Defer to OT evaluation           Lower Extremity Assessment: Generalized weakness         Communication   Communication: Expressive difficulties (tangential speech)  Cognition Arousal/Alertness: Awake/alert Behavior During Therapy: WFL for tasks assessed/performed Overall Cognitive Status: Impaired/Different from baseline Area of Impairment: Orientation;Safety/judgement;Problem solving         Safety/Judgement: Decreased awareness of safety   Problem Solving: Requires verbal cues General Comments: Tangetial speech. Difficult to get accurate history of information. Reports seeing 4 of me and having vision problems. Reports seeing electrical wires jumping out of her purse and seeing her house pad locked.    General  Comments General comments (skin integrity, edema, etc.): Pt reports being very concerned about not being able to use the call bell and phone.     Exercises        Assessment/Plan    PT Assessment Patient needs continued PT services  PT Diagnosis Difficulty  walking;Generalized weakness;Altered mental status   PT Problem List Decreased strength;Cardiopulmonary status limiting activity;Decreased cognition;Decreased activity tolerance;Decreased balance;Decreased mobility;Decreased knowledge of use of DME;Decreased safety awareness  PT Treatment Interventions Balance training;Gait training;Functional mobility training;Therapeutic activities;Therapeutic exercise;Patient/family education;Stair training;DME instruction   PT Goals (Current goals can be found in the Care Plan section) Acute Rehab PT Goals Patient Stated Goal: to get myself sorted out PT Goal Formulation: With patient Time For Goal Achievement: 12/26/15 Potential to Achieve Goals: Fair    Frequency Min 3X/week   Barriers to discharge Decreased caregiver support lives alone    Co-evaluation               End of Session Equipment Utilized During Treatment: Gait belt Activity Tolerance: Patient tolerated treatment well Patient left: in bed;with call bell/phone within reach;with bed alarm set (sitting EOB.) Nurse Communication: Mobility status;Other (comment) (behaviors)         Time: [id number] PT Time Calculation (min) (ACUTE ONLY): 25 min   Charges:   PT Evaluation $PT Eval Moderate Complexity: 1 Procedure PT Treatments $Gait Training: 8-22 mins   PT G Codes:        Clarice Zulauf A Porshea Janowski 12/12/2015, 3:50 PM  Mylo Red, PT, DPT [phone]

## 2015-12-12 NOTE — Progress Notes (Signed)
Subjective: Interval History: has complaints concern about being able to do fine tasks..  Objective: Vital signs in last 24 hours: Temp:  [97.9 F (36.6 C)-99.1 F (37.3 C)] 99.1 F (37.3 C) (05/29 0415) Pulse Rate:  [70-74] 70 (05/29 0628) Resp:  [18-20] 18 (05/29 0415) BP: (152-185)/(65-80) 185/80 mmHg (05/29 0628) SpO2:  [94 %-100 %] 96 % (05/29 0415) Weight change:   Intake/Output from previous day: 05/28 0701 - 05/29 0700 In: 1176.3 [P.O.:360; I.V.:816.3] Out: 450 [Urine:450] Intake/Output this shift:    General appearance: alert, cooperative and aggitated Resp: diminished breath sounds bilaterally Cardio: S1, S2 normal GI: soft, non-tender; bowel sounds normal; no masses,  no organomegaly and obese Extremities: edema tr-1+  Lab Results:  Recent Labs  12/11/15 0453 12/12/15 0440  WBC 16.6* 14.2*  HGB 11.3* 10.7*  HCT 34.2* 33.8*  PLT 262 253   BMET:  Recent Labs  12/11/15 0453 12/12/15 0440  NA 140 139  K 3.6 3.4*  CL 109 109  CO2 20* 19*  GLUCOSE 133* 92  BUN 21* 22*  CREATININE 2.30* 2.30*  CALCIUM 11.3* 10.3    Recent Labs  12/10/15 1201  PTH 13*   Iron Studies: No results for input(s): IRON, TIBC, TRANSFERRIN, FERRITIN in the last 72 hours.  Studies/Results: No results found.  I have reviewed the patient's current medications.  Assessment/Plan: 1 CKD/AKI  Improving.  Not far from baseline. Ca normalizing on steroids,fluids, calcitonin, pamidronate.  Has mild acidemia will tx 2 HTN vol xs. Will add Lasix on bid dose 3 Anemia follow 4 Sarcoid cause of ^ Ca 5 AMS not at baseline P Lasix, stop ivf, cont Pred, will s/o for now    LOS: 3 days   Tara [name] L 12/12/2015,12:00 PM

## 2015-12-12 NOTE — Progress Notes (Signed)
Pt still with elevated bp post receiving scheduled and prn dose of hydralazine. Paged probider, Rogue Bussing. Awaiting further orders. Will continue to monitor pt. Pt to get clonidine tablet po

## 2015-12-12 NOTE — Progress Notes (Signed)
Pt bp still elevated at 180/80. Pt given scheduled dose of hydralazine po. Will pass on to nurse to reassess.

## 2015-12-13 ENCOUNTER — Other Ambulatory Visit: Payer: Self-pay | Admitting: Internal Medicine

## 2015-12-13 LAB — RENAL FUNCTION PANEL
ALBUMIN: 3.1 g/dL — AB (ref 3.5–5.0)
ANION GAP: 9 (ref 5–15)
BUN: 30 mg/dL — ABNORMAL HIGH (ref 6–20)
CALCIUM: 9.9 mg/dL (ref 8.9–10.3)
CO2: 22 mmol/L (ref 22–32)
CREATININE: 1.94 mg/dL — AB (ref 0.44–1.00)
Chloride: 108 mmol/L (ref 101–111)
GFR calc non Af Amer: 26 mL/min — ABNORMAL LOW (ref >60)
GFR, EST AFRICAN AMERICAN: 31 mL/min — AB (ref >60)
Glucose, Bld: 127 mg/dL — ABNORMAL HIGH (ref 65–99)
PHOSPHORUS: 2.3 mg/dL — AB (ref 2.5–4.6)
Potassium: 3.6 mmol/L (ref 3.5–5.1)
SODIUM: 139 mmol/L (ref 135–145)

## 2015-12-13 LAB — CBC
HCT: 31 % — ABNORMAL LOW (ref 36.0–46.0)
HEMOGLOBIN: 9.9 g/dL — AB (ref 12.0–15.0)
MCH: 25.9 pg — AB (ref 26.0–34.0)
MCHC: 31.9 g/dL (ref 30.0–36.0)
MCV: 81.2 fL (ref 78.0–100.0)
PLATELETS: 276 10*3/uL (ref 150–400)
RBC: 3.82 MIL/uL — AB (ref 3.87–5.11)
RDW: 16.7 % — ABNORMAL HIGH (ref 11.5–15.5)
WBC: 9.6 10*3/uL (ref 4.0–10.5)

## 2015-12-13 LAB — VITAMIN D 25 HYDROXY (VIT D DEFICIENCY, FRACTURES): Vit D, 25-Hydroxy: 35.3 ng/mL (ref 30.0–100.0)

## 2015-12-13 MED ORDER — LORAZEPAM 0.5 MG PO TABS
0.5000 mg | ORAL_TABLET | Freq: Once | ORAL | Status: AC
  Administered 2015-12-13: 0.5 mg via ORAL
  Filled 2015-12-13: qty 1

## 2015-12-13 MED ORDER — AMPHETAMINE-DEXTROAMPHET ER 10 MG PO CP24
20.0000 mg | ORAL_CAPSULE | Freq: Every day | ORAL | Status: DC
  Administered 2015-12-13 – 2015-12-15 (×3): 20 mg via ORAL
  Filled 2015-12-13 (×2): qty 2
  Filled 2015-12-13: qty 1

## 2015-12-13 MED ORDER — CLORAZEPATE DIPOTASSIUM 3.75 MG PO TABS
7.5000 mg | ORAL_TABLET | Freq: Every day | ORAL | Status: DC
  Administered 2015-12-13 – 2015-12-14 (×2): 7.5 mg via ORAL
  Filled 2015-12-13 (×2): qty 2

## 2015-12-13 MED ORDER — VENLAFAXINE HCL ER 75 MG PO CP24
75.0000 mg | ORAL_CAPSULE | Freq: Every day | ORAL | Status: DC
Start: 2015-12-14 — End: 2015-12-27
  Administered 2015-12-14 – 2015-12-27 (×14): 75 mg via ORAL
  Filled 2015-12-13 (×14): qty 1

## 2015-12-13 NOTE — Care Management Note (Addendum)
Case Management Note  Patient Details  Name: Tara [name] MRN: [id number] Date of Birth: 06-Feb-1951  Subjective/Objective:          Admitted with confused/ metabolic encephalopathy secondary to Hypercalcemia and Uremia. Hx of chronic kidney failure, sarcoidosis, DM, chronic knee pain/stability. Per pt's sister Fraser Din, [place] pt lived alone and was independent with ADL's and required no DME usage. PCP: Cathlean Cower.  Action/Plan: Psych consult pending Per PT's recommedation,SNF. CM to f/u with disposition needs  Expected Discharge Date:                  Expected Discharge Plan:  [place].  In-House Referral:  Clinical Social Work  Ship broker  CM Consult  Post Acute Care Choice:    Choice offered to:     DME Arranged:    DME Agency:     HH Arranged:    HH Agency:     Status of Service:  In process, will continue to follow  Medicare Important Message Given:    Date Medicare IM Given:    Medicare IM give by:    Date Additional Medicare IM Given:    Additional Medicare Important Message give by:     If discussed at [place] of Stay Meetings, dates discussed:    Additional Comments: [name] (Sister) [phone]  Whitman Hero [place], [place] [phone] 12/13/2015, 9:04 AM
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
## 2015-12-13 NOTE — Clinical Social Work Note (Signed)
Clinical Social Work Assessment  Patient Details  Name: Tara [name] MRN: [id number] Date of Birth: 08-08-1950  Date of referral:  12/13/15               Reason for consult:  Facility Placement                Permission sought to share information with:  Facility Sport and exercise psychologist, Family Supports Permission granted to share information::  Yes, Verbal Permission Granted  Name::     Actuary::  SNFs  Relationship::  Sister  Contact Information:  [phone]  Housing/Transportation Living arrangements for the past 2 months:  Single Family Home Source of Information:  Other (Comment Required) (Sister) Patient Interpreter Needed:  None Criminal Activity/Legal Involvement Pertinent to Current Situation/Hospitalization:  No - Comment as needed Significant Relationships:  Siblings Lives with:  Self Do you feel safe going back to the place where you live?  No Need for family participation in patient care:  Yes (Comment)  Care giving concerns:  CSW received referral for possible SNF placement at time of discharge. Patient is confused. CSW spoke with patient's sister regarding PT recommendation of SNF placement at time of discharge. Per patient's sister, patient lives alone and is currently unable to care for herself at home given patient's current physical needs. Patient's sister expressed understanding of PT recommendation and is agreeable to SNF placement at time of discharge. CSW to continue to follow and assist with discharge planning needs.   Social Worker assessment / plan:  CSW spoke with patient's sister concerning possibility of rehab at [facility] before returning home.  Employment status:  Other (Comment) Insurance information:  Managed Care PT Recommendations:  [place] / Referral to community resources:  [place]  Patient/Family's Response to care:  Patient's sister recognizes need for rehab before returning home and is  agreeable to a SNF. Patient's sister lives in [place] and will not be able to help patient at home. CSW explained insurance approval process and patient's sister stated that she thought patient would be able to pay privately at [facility] until insurance authorization is obtained. CSW to follow up.   Patient/Family's Understanding of and Emotional Response to Diagnosis, Current Treatment, and Prognosis:  Patient is realistic regarding therapy needs. No questions/concerns about plan or treatment.    Emotional Assessment Appearance:  Appears stated age Attitude/Demeanor/Rapport:  Paranoid Affect (typically observed):  Unable to Assess Orientation:  Oriented to Self, Oriented to Place, Oriented to  Time, Oriented to Situation Alcohol / Substance use:  Not Applicable Psych involvement (Current and /or in the community):  Yes (Comment) (Psych consulted for hallucinations)  Discharge Needs  Concerns to be addressed:  Care Coordination, Cognitive Concerns Readmission within the last 30 days:  No Current discharge risk:  None Barriers to Discharge:  Continued Medical Work up   [employer], [place] 12/13/2015, 10:53 AM

## 2015-12-13 NOTE — Progress Notes (Signed)
Physical Therapy Treatment Patient Details Name: Tara [name] MRN: [id number] DOB: 1951/02/22 Today's Date: 12/13/2015    History of Present Illness Patient is a 65 y/o female with hx of chronic kidney failure, hx sarcoidosis, DM, chronic knee pain/stability presents with confusion. Found to have metabolic encephalopathy- due to Hypercalcemia and Uremia.    PT Comments    Pt in recliner having difficulty using/dialing phone.  Tremors throughout.  Appears nervous/anxious.  Assisted with dialing the phone for pt then amb to bathroom.  Very unsteady/shaky gait.  Poor self correction reaction.  HIGH FALL RISK.  amb in hallway a limited distance due to c/o fatigue.  Cont to demonstrate difficulty finding her words but very able to follow directions and multi step instructions.  Pt will need ST Rehab at SNF prior to returning home.   Follow Up Recommendations  SNF     Equipment Recommendations       Recommendations for Other Services       Precautions / Restrictions Precautions Precautions: Fall Precaution Comments: acute expressive aphagia Restrictions Weight Bearing Restrictions: No    Mobility  Bed Mobility                  Transfers                    Ambulation/Gait                 Stairs            Wheelchair Mobility    Modified Rankin (Stroke Patients Only)       Balance                                    Cognition Arousal/Alertness: Awake/alert Behavior During Therapy: Anxious;Impulsive Overall Cognitive Status: Impaired/Different from baseline Area of Impairment: Orientation;Safety/judgement;Problem solving               General Comments: expressive aphagia with difficulty word finding with some frustration and anxiety    Exercises      General Comments        Pertinent Vitals/Pain Pain Assessment: No/denies pain    Home Living                      Prior Function            PT  Goals (current goals can now be found in the care plan section) Progress towards PT goals: Progressing toward goals    Frequency  Min 3X/week    PT Plan Current plan remains appropriate    Co-evaluation             End of Session Equipment Utilized During Treatment: Gait belt Activity Tolerance: Patient tolerated treatment well Patient left: in chair;with call bell/phone within reach     Time: [id number] PT Time Calculation (min) (ACUTE ONLY): 18 min  Charges:  $Gait Training: 8-22 mins                    G Codes:      Rica Koyanagi  PTA [facility]  Acute  Rehab Pager      [phone]

## 2015-12-13 NOTE — Progress Notes (Signed)
[facility]                                                                              Patient Demographics  Tara [name], is a 65 y.o. female, DOB - [birth year], KGM:[id number]  Admit date - 12/09/2015   Admitting Physician Ron Parker, MD  Outpatient Primary MD for the patient is Oliver Barre, MD  Outpatient specialists:   LOS - 4  days    Chief Complaint  Patient presents with  . Altered Mental Status       Brief summary   Tara [name] is a 65 y.o. female with a history of Sarcoidosis, HTN, CKD who did not show up for work today and her colleagues had [employer] go to her home and she was found to be severely confused.In the ED, patient was evaluated and a CT scan of the head, Chest x-ray all were unremarkable.BMET Hypercalcemia at 14.5, and Hypokalemia at 2.3, along with a elevated BUN/Cr Of 23/2.82. She also had an elevated Troponin level at 0.16. She was referred for admission.    Assessment & Plan    Principal Problem:   Acute Metabolic encephalopathy: Likely due to profound dehydration, hypercalcemia and uremia. Patient acting off today, suspicious and paranoid and rambling - CT head negative, ammonia level 17 - Patient was placed on aggressive IV fluid hydration, IV pamidronate 1, miacalcin - Calcium has trended down - MRI brain Negative for any acute CVA - Patient continued to be paranoid, pressured speech and rambling, "I am being set up, my phone disappeared this morning, talking about the cats and Romeo (?)". I called patient's sister however was unable to contact her to assess if she has any underlying psych history. Called psychiatry consult. Ativan 0.5 mg by mouth 1   Active Problems:   Sarcoidosis (HCC) - Follows Dr. Maple Hudson, last visit 04/2015, sarcoidosis diagnosed in 2009. Patient was hospitalized in 2014 with similar symptoms, hypercalcemia and renal insufficiency and was placed on prednisone with slow taper at that time -  Continue prednisone, will make pulmonology follow-up prior to discharge  Uncontrolled hypertension - Continue beta blocker, added hydralazine, clonidine - BP also somewhat elevated due to anxiety    Hypercalcemia: Due to underlying sarcoidosis, not on steroids prior to admission - Patient received aggressive IV fluid hydration, received IV pamidronate 1, continue miacalcin - Started prednisone - PTH appropriately low 13, vitamin D levels 1,25  58.6, PTH RP pending - ACE level high, 90 will need to stay on prednisone     Acute on chronic kidney failure (HCC): Due to profound dehydration and hypercalcemia, has underlying chronic kidney disease, stage IV - Creatinine improving, 1.9 today - Currently on Lasix, renal following  Escherichia coli  UTI (urinary tract infection) - Urine culture showing Escherichia coli, continue IV Rocephin    Elevated troponin: Likely due to acute renal insufficiency and hypercalcemia, demand ischemia - No chest pain or shortness of breath currently - 2-D echo showed EF of 60-65% with normal wall motion  Generalized debility - PTOT evaluation recommending skilled nursing facility  Code Status: Full CODE STATUS  DVT Prophylaxis:  Lovenox  Family Communication: Discussed in detail with the patient, all imaging results, lab results explained to the patient. I was unable to contact her sister.    Disposition Plan: Hopefully DC in 24-48 hours if mental status is improving, will need skilled nursing facility  Time Spent in minutes   25 minutes  Procedures:  CT head negative   Consultants:   Nephrology  Antimicrobials    IV Rocephin 5/26 >   Medications  Scheduled Meds: . atenolol  50 mg Oral Daily  . calcitonin (salmon)  1 spray Alternating Nares Daily  . cefTRIAXone (ROCEPHIN)  IV  1 g Intravenous Q24H  . cloNIDine  0.1 mg Oral BID  . diltiazem  240 mg Oral Daily  . enoxaparin (LOVENOX) injection  40 mg Subcutaneous Q24H  . furosemide   80 mg Oral BID  . hydrALAZINE  50 mg Oral Q8H  . predniSONE  20 mg Oral Q breakfast  . sodium bicarbonate  1,300 mg Oral BID   Continuous Infusions:   PRN Meds:.acetaminophen **OR** acetaminophen, hydrALAZINE, HYDROmorphone (DILAUDID) injection, ondansetron **OR** ondansetron (ZOFRAN) IV, polyvinyl alcohol   Antibiotics   Anti-infectives    Start     Dose/Rate Route Frequency Ordered Stop   12/10/15 2200  cefTRIAXone (ROCEPHIN) 1 g in dextrose 5 % 50 mL IVPB     1 g 100 mL/hr over 30 Minutes Intravenous Every 24 hours 12/09/15 2155     12/09/15 2200  cefTRIAXone (ROCEPHIN) 1 g in dextrose 5 % 50 mL IVPB     1 g 100 mL/hr over 30 Minutes Intravenous  Once 12/09/15 2148 12/09/15 2233        Subjective:   Tara [name] was seen and examined today. Alert and awake, oriented to self and place. However very confused, tangential, paranoid and tearful. Denies any chest pain or shortness of breath. No abdominal pain, nausea, vomiting or diarrhea.   Objective:   Filed Vitals:   12/12/15 1615 12/12/15 2226 12/13/15 0208 12/13/15 0934  BP: 160/79 192/84 140/90 161/99  Pulse: 62 65 65   Temp: 98.5 F (36.9 C) 98.3 F (36.8 C) 98 F (36.7 C)   TempSrc:  Oral Oral   Resp: 19 18 18    Height:      Weight:      SpO2: 100% 100% 97%     Intake/Output Summary (Last 24 hours) at 12/13/15 1019 Last data filed at 12/13/15 0900  Gross per 24 hour  Intake    680 ml  Output   1702 ml  Net  -1022 ml     Wt Readings from Last 3 Encounters:  12/09/15 106.595 kg (235 lb)  11/18/15 107.956 kg (238 lb)  11/01/15 107.956 kg (238 lb)     Exam  General: Alert and oriented x 2, Oriented to self and place however otherwise tangential, paranoid in her speech  HEENT:   Neck:   Cardiovascular: S1 S2 Clear, RRR  Respiratory:  Decreased breath sounds at the bases  Gastrointestinal: Soft, nontender, nondistended, + bowel sounds  Ext: no cyanosis clubbing or edema   Neuro: strength  5/5 in upper and lower extremities, ambulating  Skin: No rashes  Psych: paranoid and anxious.   Data Reviewed:  I have personally reviewed following labs and imaging studies  Micro Results Recent Results (from the past 240 hour(s))  Urine culture     Status: Abnormal   Collection Time: 12/09/15  6:37 PM  Result Value Ref Range Status   Specimen Description URINE,  RANDOM  Final   Special Requests NONE  Final   Culture >=100,000 COLONIES/mL ESCHERICHIA COLI (A)  Final   Report Status 12/12/2015 FINAL  Final   Organism ID, Bacteria ESCHERICHIA COLI (A)  Final      Susceptibility   Escherichia coli - MIC*    AMPICILLIN <=2 SENSITIVE Sensitive     CEFAZOLIN <=4 SENSITIVE Sensitive     CEFTRIAXONE <=1 SENSITIVE Sensitive     CIPROFLOXACIN <=0.25 SENSITIVE Sensitive     GENTAMICIN <=1 SENSITIVE Sensitive     IMIPENEM <=0.25 SENSITIVE Sensitive     NITROFURANTOIN <=16 SENSITIVE Sensitive     TRIMETH/SULFA <=20 SENSITIVE Sensitive     AMPICILLIN/SULBACTAM <=2 SENSITIVE Sensitive     PIP/TAZO <=4 SENSITIVE Sensitive     * >=100,000 COLONIES/mL ESCHERICHIA COLI    Radiology Reports Dg Chest 2 View  12/09/2015  CLINICAL DATA:  Altered mental status. EXAM: CHEST  2 VIEW COMPARISON:  October 12, 2015. FINDINGS: The heart size and mediastinal contours are within normal limits. Both lungs are clear. No pneumothorax or pleural effusion is noted. The visualized skeletal structures are unremarkable. IMPRESSION: No active cardiopulmonary disease. Electronically Signed   By: Lupita Raider, M.D.   On: 12/09/2015 19:44   Ct Head Wo Contrast  12/09/2015  CLINICAL DATA:  Altered mental status. EXAM: CT HEAD WITHOUT CONTRAST TECHNIQUE: Contiguous axial images were obtained from the base of the skull through the vertex without intravenous contrast. COMPARISON:  CT scan of July 23, 2012. FINDINGS: Bony calvarium appears intact. No mass effect or midline shift is noted. Ventricular size is within normal  limits. There is no evidence of mass lesion, hemorrhage or acute infarction. IMPRESSION: Normal head CT. Electronically Signed   By: Lupita Raider, M.D.   On: 12/09/2015 19:55   Mr Brain Wo Contrast  12/12/2015  CLINICAL DATA:  65 year old hypertensive female with history of sarcoidosis, chronic kidney disease and migraines. Episode of slurred speech with difficulty finding words. Confusion. Subsequent encounter. EXAM: MRI HEAD WITHOUT CONTRAST TECHNIQUE: Multiplanar, multiecho pulse sequences of the brain and surrounding structures were obtained without intravenous contrast. COMPARISON:  12/09/2015 CT.  05/08/2008 MR. FINDINGS: Exam is significantly motion degraded. No acute infarct or intracranial hemorrhage. Minimal nonspecific white matter changes may be related to small vessel disease or migraine headaches. Mild global atrophy without hydrocephalus. No intracranial mass lesion noted on this unenhanced exam. Major intracranial vascular structures are patent. No acute orbital abnormality noted. Cervical medullary junction appears intact. IMPRESSION: Significantly motion degraded exam without evidence of acute infarct. Electronically Signed   By: Lacy Duverney M.D.   On: 12/12/2015 14:35    Lab Data:  CBC:  Recent Labs Lab 12/09/15 1850 12/10/15 0555 12/11/15 0453 12/12/15 0440 12/13/15 0659  WBC 7.3 9.4 16.6* 14.2* 9.6  HGB 10.8* 11.3* 11.3* 10.7* 9.9*  HCT 32.4* 34.5* 34.2* 33.8* 31.0*  MCV 78.6 79.3 80.5 82.6 81.2  PLT 255 255 262 253 276   Basic Metabolic Panel:  Recent Labs Lab 12/10/15 0015 12/10/15 0555 12/10/15 1201 12/10/15 1603 12/11/15 0453 12/12/15 0440 12/13/15 0659  NA  --  143  --  138 140 139 139  K  --  3.3*  --  3.1* 3.6 3.4* 3.6  CL  --  109  --  106 109 109 108  CO2  --  21*  --  22 20* 19* 22  GLUCOSE  --  108*  --  192* 133* 92 127*  BUN  --  20  --  21* 21* 22* 30*  CREATININE  --  2.50*  --  2.41* 2.30* 2.30* 1.94*  CALCIUM  --  13.4*  --  12.7*  11.3* 10.3 9.9  MG 1.9  --   --   --   --   --   --   PHOS  --   --  1.3*  --  2.4* 1.5* 2.3*   GFR: Estimated Creatinine Clearance: 36.6 mL/min (by C-G formula based on Cr of 1.94). Liver Function Tests:  Recent Labs Lab 12/09/15 1850 12/11/15 0453 12/12/15 0440 12/13/15 0659  AST 34  --   --   --   ALT 27  --   --   --   ALKPHOS 71  --   --   --   BILITOT 0.5  --   --   --   PROT 7.8  --   --   --   ALBUMIN 3.9 3.4* 3.2* 3.1*   No results for input(s): LIPASE, AMYLASE in the last 168 hours.  Recent Labs Lab 12/09/15 2014  AMMONIA 17   Coagulation Profile: No results for input(s): INR, PROTIME in the last 168 hours. Cardiac Enzymes:  Recent Labs Lab 12/10/15 0015 12/10/15 0555 12/10/15 1105  TROPONINI 0.13* 0.12* 0.18*   BNP (last 3 results) No results for input(s): PROBNP in the last 8760 hours. HbA1C: No results for input(s): HGBA1C in the last 72 hours. CBG:  Recent Labs Lab 12/09/15 1832  GLUCAP 86   Lipid Profile: No results for input(s): CHOL, HDL, LDLCALC, TRIG, CHOLHDL, LDLDIRECT in the last 72 hours. Thyroid Function Tests: No results for input(s): TSH, T4TOTAL, FREET4, T3FREE, THYROIDAB in the last 72 hours. Anemia Panel: No results for input(s): VITAMINB12, FOLATE, FERRITIN, TIBC, IRON, RETICCTPCT in the last 72 hours. Urine analysis:    Component Value Date/Time   COLORURINE YELLOW 12/09/2015 1837   APPEARANCEUR CLOUDY* 12/09/2015 1837   LABSPEC 1.013 12/09/2015 1837   PHURINE 6.5 12/09/2015 1837   GLUCOSEU NEGATIVE 12/09/2015 1837   GLUCOSEU NEGATIVE 09/27/2015 0849   HGBUR SMALL* 12/09/2015 1837   BILIRUBINUR NEGATIVE 12/09/2015 1837   KETONESUR NEGATIVE 12/09/2015 1837   PROTEINUR 30* 12/09/2015 1837   UROBILINOGEN 0.2 09/27/2015 0849   NITRITE NEGATIVE 12/09/2015 1837   LEUKOCYTESUR MODERATE* 12/09/2015 1837     Darl Kuss M.D. [facility] 12/13/2015, 10:19 AM  Pager: [phone] Between 7am to 7pm - call Pager -  [phone]  After 7pm go to [email or web address] - password [email or web address]  Call night coverage person covering after 7pm
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
## 2015-12-13 NOTE — Clinical Social Work Placement (Signed)
   CLINICAL SOCIAL WORK PLACEMENT  NOTE  Date:  12/13/2015  Patient Details  Name: Tara [name] MRN: [id number] Date of Birth: March 11, 1951  Clinical Social Work is seeking post-discharge placement for this patient at the [place] level of care (*CSW will initial, date and re-position this form in  chart as items are completed):      Patient/family provided with [facility] Work Department's list of facilities offering this level of care within the geographic area requested by the patient (or if unable, by the patient's family).      Patient/family informed of their freedom to choose among providers that offer the needed level of care, that participate in Medicare, Medicaid or managed care program needed by the patient, have an available bed and are willing to accept the patient.      Patient/family informed of [facility]'s ownership interest in [facility] and [facility], as well as of the fact that they are under no obligation to receive care at these facilities.  PASRR submitted to EDS on 12/13/15     PASRR number received on 12/13/15     Existing PASRR number confirmed on       FL2 transmitted to all facilities in geographic area requested by pt/family on 12/13/15     FL2 transmitted to all facilities within larger geographic area on       Patient informed that his/her managed care company has contracts with or will negotiate with certain facilities, including the following:            Patient/family informed of bed offers received.  Patient chooses bed at       Physician recommends and patient chooses bed at      Patient to be transferred to   on  .  Patient to be transferred to facility by       Patient family notified on   of transfer.  Name of family member notified:        PHYSICIAN Please sign FL2     Additional Comment:    _______________________________________________ Benard Halsted, [place] 12/13/2015, 11:01  AM

## 2015-12-14 LAB — CBC
HCT: 33 % — ABNORMAL LOW (ref 36.0–46.0)
HEMOGLOBIN: 10.5 g/dL — AB (ref 12.0–15.0)
MCH: 25.6 pg — AB (ref 26.0–34.0)
MCHC: 31.8 g/dL (ref 30.0–36.0)
MCV: 80.5 fL (ref 78.0–100.0)
PLATELETS: 338 10*3/uL (ref 150–400)
RBC: 4.1 MIL/uL (ref 3.87–5.11)
RDW: 16.2 % — ABNORMAL HIGH (ref 11.5–15.5)
WBC: 10.1 10*3/uL (ref 4.0–10.5)

## 2015-12-14 LAB — RENAL FUNCTION PANEL
ALBUMIN: 3.3 g/dL — AB (ref 3.5–5.0)
Anion gap: 10 (ref 5–15)
BUN: 32 mg/dL — AB (ref 6–20)
CALCIUM: 9.7 mg/dL (ref 8.9–10.3)
CHLORIDE: 105 mmol/L (ref 101–111)
CO2: 26 mmol/L (ref 22–32)
Creatinine, Ser: 2.14 mg/dL — ABNORMAL HIGH (ref 0.44–1.00)
GFR calc Af Amer: 27 mL/min — ABNORMAL LOW (ref >60)
GFR calc non Af Amer: 23 mL/min — ABNORMAL LOW (ref >60)
GLUCOSE: 105 mg/dL — AB (ref 65–99)
POTASSIUM: 3.2 mmol/L — AB (ref 3.5–5.1)
Phosphorus: 2.9 mg/dL (ref 2.5–4.6)
Sodium: 141 mmol/L (ref 135–145)

## 2015-12-14 MED ORDER — FUROSEMIDE 40 MG PO TABS
40.0000 mg | ORAL_TABLET | Freq: Two times a day (BID) | ORAL | Status: DC
  Administered 2015-12-14 – 2015-12-27 (×26): 40 mg via ORAL
  Filled 2015-12-14 (×27): qty 1
  Filled 2015-12-14: qty 2

## 2015-12-14 MED ORDER — POTASSIUM CHLORIDE CRYS ER 20 MEQ PO TBCR
40.0000 meq | EXTENDED_RELEASE_TABLET | Freq: Once | ORAL | Status: AC
  Administered 2015-12-14: 40 meq via ORAL
  Filled 2015-12-14: qty 2

## 2015-12-14 NOTE — Progress Notes (Signed)
[facility]                                                                              Patient Demographics  Tara [name], is a 65 y.o. female, DOB - 1951-06-07, [id number]  Admit date - 12/09/2015   Admitting Physician Theressa Millard, MD  Outpatient Primary MD for the patient is Cathlean Cower, MD  Outpatient specialists:   LOS - 5  days    Chief Complaint  Patient presents with  . Altered Mental Status       Brief summary   Tara [name] is a 65 y.o. female with a history of Sarcoidosis, HTN, CKD who did not show up for work today and her colleagues had [employer] go to her home and she was found to be severely confused.In the ED, patient was evaluated and a CT scan of the head, Chest x-ray all were unremarkable.BMET Hypercalcemia at 14.5, and Hypokalemia at 2.3, along with a elevated BUN/Cr Of 23/2.82. She also had an elevated Troponin level at 0.16. She was referred for admission.    Assessment & Plan    Principal Problem:   Acute Metabolic encephalopathy: Likely due to profound dehydration, hypercalcemia and uremia. Patient acting off today, suspicious and paranoid and rambling - CT head negative, ammonia level 17 - Patient was placed on aggressive IV fluid hydration, IV pamidronate 1, miacalcin - Calcium has trended down - MRI brain Negative for any acute CVA - Confusion is slightly improving, more calm and cooperative today, appreciate psychiatry recommendations. Per psychiatry, patient was started on Adderall XR, venlafaxine XL, clorazepate. Will require inpatient psych hospitalization if dental status is not improved.  Active Problems:   Sarcoidosis [facility]) - Follows Dr. Annamaria Boots, last visit 04/2015, sarcoidosis diagnosed in 2009. Patient was hospitalized in 2014 with similar symptoms, hypercalcemia and renal insufficiency and was placed on prednisone with slow taper at that time - Continue prednisone, will make pulmonology follow-up prior to  discharge  Uncontrolled hypertension -  better controlled today, continue atenolol, hydralazine, clonidine    Hypercalcemia: Due to underlying sarcoidosis, not on steroids prior to admission - Patient received aggressive IV fluid hydration, received IV pamidronate 1, continue miacalcin - Started prednisone - PTH appropriately low 13, vitamin D levels 1,25  58.6, PTH RP pending - ACE level high, 90 will need to stay on prednisone     Acute on chronic kidney failure ([place]): Due to profound dehydration and hypercalcemia, has underlying chronic kidney disease, stage IV -  creatinine trend up to 2.14 today, decrease Lasix to 40 mg twice a day  - Potassium replaced   Escherichia coli  UTI (urinary tract infection) - Urine culture showing Escherichia coli, continue IV Rocephin    Elevated troponin: Likely due to acute renal insufficiency and hypercalcemia, demand ischemia - No chest pain or shortness of breath currently - 2-D echo showed EF of 60-65% with normal wall motion  Generalized debility - PTOT evaluation recommending skilled nursing facility  Code Status: Full CODE STATUS  DVT Prophylaxis:  Lovenox   Family Communication: Discussed in detail with the patient, all imaging results, lab results explained to the patient.  I was unable to contact her sister.    Disposition Plan:   Time Spent in minutes   25 minutes  Procedures:  CT head negative   Consultants:   Nephrology  Antimicrobials    IV Rocephin 5/26 >   Medications  Scheduled Meds: . amphetamine-dextroamphetamine  20 mg Oral Daily  . atenolol  50 mg Oral Daily  . calcitonin (salmon)  1 spray Alternating Nares Daily  . cefTRIAXone (ROCEPHIN)  IV  1 g Intravenous Q24H  . cloNIDine  0.1 mg Oral BID  . clorazepate  7.5 mg Oral QHS  . diltiazem  240 mg Oral Daily  . enoxaparin (LOVENOX) injection  40 mg Subcutaneous Q24H  . furosemide  40 mg Oral BID  . hydrALAZINE  50 mg Oral Q8H  . predniSONE  20 mg  Oral Q breakfast  . sodium bicarbonate  1,300 mg Oral BID  . venlafaxine XR  75 mg Oral Q breakfast   Continuous Infusions:   PRN Meds:.acetaminophen **OR** acetaminophen, hydrALAZINE, HYDROmorphone (DILAUDID) injection, ondansetron **OR** ondansetron (ZOFRAN) IV, polyvinyl alcohol   Antibiotics   Anti-infectives    Start     Dose/Rate Route Frequency Ordered Stop   12/10/15 2200  cefTRIAXone (ROCEPHIN) 1 g in dextrose 5 % 50 mL IVPB     1 g 100 mL/hr over 30 Minutes Intravenous Every 24 hours 12/09/15 2155     12/09/15 2200  cefTRIAXone (ROCEPHIN) 1 g in dextrose 5 % 50 mL IVPB     1 g 100 mL/hr over 30 Minutes Intravenous  Once 12/09/15 2148 12/09/15 2233        Subjective:   Tara [name] was seen and examined today.Much more alert and oriented today. Still a bit off and confused although improving from yesterday.   Denies any chest pain or shortness of breath. No abdominal pain, nausea, vomiting or diarrhea.   Objective:   Filed Vitals:   12/13/15 1613 12/13/15 2037 12/14/15 0012 12/14/15 0453  BP: 153/63 144/76 136/67 156/62  Pulse: 69 72 71 61  Temp:  98.9 F (37.2 C) 98.4 F (36.9 C) 98.7 F (37.1 C)  TempSrc:      Resp:  16 16 16   Height:      Weight:      SpO2:  98% 99% 98%    Intake/Output Summary (Last 24 hours) at 12/14/15 1304 Last data filed at 12/14/15 0900  Gross per 24 hour  Intake    840 ml  Output   1300 ml  Net   -460 ml     Wt Readings from Last 3 Encounters:  12/09/15 106.595 kg (235 lb)  11/18/15 107.956 kg (238 lb)  11/01/15 107.956 kg (238 lb)     Exam  General: Alert and oriented x 2, NAD  HEENT:   Neck:   Cardiovascular: S1 S2 Clear, RRR  Respiratory:  Decreased breath sounds at the bases  Gastrointestinal: Soft, nontender, nondistended, + bowel sounds  Ext: no cyanosis clubbing or edema   Neuro: strength 5/5 in upper and lower extremities, ambulating  Skin: No rashes  Psych: much more alert and oriented today  however still somewhat confused   Data Reviewed:  I have personally reviewed following labs and imaging studies  Micro Results Recent Results (from the past 240 hour(s))  Urine culture     Status: Abnormal   Collection Time: 12/09/15  6:37 PM  Result Value Ref Range Status   Specimen Description URINE, RANDOM  Final   Special  Requests NONE  Final   Culture >=100,000 COLONIES/mL ESCHERICHIA COLI (A)  Final   Report Status 12/12/2015 FINAL  Final   Organism ID, Bacteria ESCHERICHIA COLI (A)  Final      Susceptibility   Escherichia coli - MIC*    AMPICILLIN <=2 SENSITIVE Sensitive     CEFAZOLIN <=4 SENSITIVE Sensitive     CEFTRIAXONE <=1 SENSITIVE Sensitive     CIPROFLOXACIN <=0.25 SENSITIVE Sensitive     GENTAMICIN <=1 SENSITIVE Sensitive     IMIPENEM <=0.25 SENSITIVE Sensitive     NITROFURANTOIN <=16 SENSITIVE Sensitive     TRIMETH/SULFA <=20 SENSITIVE Sensitive     AMPICILLIN/SULBACTAM <=2 SENSITIVE Sensitive     PIP/TAZO <=4 SENSITIVE Sensitive     * >=100,000 COLONIES/mL ESCHERICHIA COLI    Radiology Reports Dg Chest 2 View  12/09/2015  CLINICAL DATA:  Altered mental status. EXAM: CHEST  2 VIEW COMPARISON:  October 12, 2015. FINDINGS: The heart size and mediastinal contours are within normal limits. Both lungs are clear. No pneumothorax or pleural effusion is noted. The visualized skeletal structures are unremarkable. IMPRESSION: No active cardiopulmonary disease. Electronically Signed   By: Marijo Conception, M.D.   On: 12/09/2015 19:44   Ct Head Wo Contrast  12/09/2015  CLINICAL DATA:  Altered mental status. EXAM: CT HEAD WITHOUT CONTRAST TECHNIQUE: Contiguous axial images were obtained from the base of the skull through the vertex without intravenous contrast. COMPARISON:  CT scan of July 23, 2012. FINDINGS: Bony calvarium appears intact. No mass effect or midline shift is noted. Ventricular size is within normal limits. There is no evidence of mass lesion, hemorrhage or acute  infarction. IMPRESSION: Normal head CT. Electronically Signed   By: Marijo Conception, M.D.   On: 12/09/2015 19:55   Mr Brain Wo Contrast  12/12/2015  CLINICAL DATA:  65 year old hypertensive female with history of sarcoidosis, chronic kidney disease and migraines. Episode of slurred speech with difficulty finding words. Confusion. Subsequent encounter. EXAM: MRI HEAD WITHOUT CONTRAST TECHNIQUE: Multiplanar, multiecho pulse sequences of the brain and surrounding structures were obtained without intravenous contrast. COMPARISON:  12/09/2015 CT.  05/08/2008 MR. FINDINGS: Exam is significantly motion degraded. No acute infarct or intracranial hemorrhage. Minimal nonspecific white matter changes may be related to small vessel disease or migraine headaches. Mild global atrophy without hydrocephalus. No intracranial mass lesion noted on this unenhanced exam. Major intracranial vascular structures are patent. No acute orbital abnormality noted. Cervical medullary junction appears intact. IMPRESSION: Significantly motion degraded exam without evidence of acute infarct. Electronically Signed   By: Genia Del M.D.   On: 12/12/2015 14:35    Lab Data:  CBC:  Recent Labs Lab 12/10/15 0555 12/11/15 0453 12/12/15 0440 12/13/15 0659 12/14/15 0625  WBC 9.4 16.6* 14.2* 9.6 10.1  HGB 11.3* 11.3* 10.7* 9.9* 10.5*  HCT 34.5* 34.2* 33.8* 31.0* 33.0*  MCV 79.3 80.5 82.6 81.2 80.5  PLT 255 262 253 276 [id number]   Basic Metabolic Panel:  Recent Labs Lab 12/10/15 0015  12/10/15 1201 12/10/15 1603 12/11/15 0453 12/12/15 0440 12/13/15 0659 12/14/15 0625  NA  --   < >  --  138 140 139 139 141  K  --   < >  --  3.1* 3.6 3.4* 3.6 3.2*  CL  --   < >  --  106 109 109 108 105  CO2  --   < >  --  22 20* 19* 22 26  GLUCOSE  --   < >  --  192* 133* 92 127* 105*  BUN  --   < >  --  21* 21* 22* 30* 32*  CREATININE  --   < >  --  2.41* 2.30* 2.30* 1.94* 2.14*  CALCIUM  --   < >  --  12.7* 11.3* 10.3 9.9 9.7  MG 1.9  --    --   --   --   --   --   --   PHOS  --   --  1.3*  --  2.4* 1.5* 2.3* 2.9  < > = values in this interval not displayed. GFR: Estimated Creatinine Clearance: 33.2 mL/min (by C-G formula based on Cr of 2.14). Liver Function Tests:  Recent Labs Lab 12/09/15 1850 12/11/15 0453 12/12/15 0440 12/13/15 0659 12/14/15 0625  AST 34  --   --   --   --   ALT 27  --   --   --   --   ALKPHOS 71  --   --   --   --   BILITOT 0.5  --   --   --   --   PROT 7.8  --   --   --   --   ALBUMIN 3.9 3.4* 3.2* 3.1* 3.3*   No results for input(s): LIPASE, AMYLASE in the last 168 hours.  Recent Labs Lab 12/09/15 2014  AMMONIA 17   Coagulation Profile: No results for input(s): INR, PROTIME in the last 168 hours. Cardiac Enzymes:  Recent Labs Lab 12/10/15 0015 12/10/15 0555 12/10/15 1105  TROPONINI 0.13* 0.12* 0.18*   BNP (last 3 results) No results for input(s): PROBNP in the last 8760 hours. HbA1C: No results for input(s): HGBA1C in the last 72 hours. CBG:  Recent Labs Lab 12/09/15 1832  GLUCAP 86   Lipid Profile: No results for input(s): CHOL, HDL, LDLCALC, TRIG, CHOLHDL, LDLDIRECT in the last 72 hours. Thyroid Function Tests: No results for input(s): TSH, T4TOTAL, FREET4, T3FREE, THYROIDAB in the last 72 hours. Anemia Panel: No results for input(s): VITAMINB12, FOLATE, FERRITIN, TIBC, IRON, RETICCTPCT in the last 72 hours. Urine analysis:    Component Value Date/Time   COLORURINE YELLOW 12/09/2015 1837   APPEARANCEUR CLOUDY* 12/09/2015 1837   LABSPEC 1.013 12/09/2015 1837   PHURINE 6.5 12/09/2015 1837   GLUCOSEU NEGATIVE 12/09/2015 1837   GLUCOSEU NEGATIVE 09/27/2015 0849   HGBUR SMALL* 12/09/2015 1837   BILIRUBINUR NEGATIVE 12/09/2015 1837   [place] 12/09/2015 1837   PROTEINUR 30* 12/09/2015 1837   UROBILINOGEN 0.2 09/27/2015 0849   NITRITE NEGATIVE 12/09/2015 1837   LEUKOCYTESUR MODERATE* 12/09/2015 1837     RAI,RIPUDEEP M.D. [facility] 12/14/2015, 1:04 PM  Pager: [phone] Between 7am to 7pm - call Pager - [phone]  After 7pm go to [email or web address] - password [email or web address]  Call night coverage person covering after 7pm

## 2015-12-15 DIAGNOSIS — F3110 Bipolar disorder, current episode manic without psychotic features, unspecified: Secondary | ICD-10-CM

## 2015-12-15 LAB — BASIC METABOLIC PANEL
Anion gap: 9 (ref 5–15)
BUN: 33 mg/dL — AB (ref 6–20)
CHLORIDE: 104 mmol/L (ref 101–111)
CO2: 24 mmol/L (ref 22–32)
CREATININE: 2.01 mg/dL — AB (ref 0.44–1.00)
Calcium: 9.3 mg/dL (ref 8.9–10.3)
GFR calc Af Amer: 29 mL/min — ABNORMAL LOW (ref >60)
GFR calc non Af Amer: 25 mL/min — ABNORMAL LOW (ref >60)
GLUCOSE: 103 mg/dL — AB (ref 65–99)
POTASSIUM: 3.7 mmol/L (ref 3.5–5.1)
SODIUM: 137 mmol/L (ref 135–145)

## 2015-12-15 MED ORDER — AMPHETAMINE-DEXTROAMPHET ER 10 MG PO CP24
30.0000 mg | ORAL_CAPSULE | Freq: Every day | ORAL | Status: DC
  Administered 2015-12-16 – 2015-12-27 (×12): 30 mg via ORAL
  Filled 2015-12-15 (×12): qty 3

## 2015-12-15 MED ORDER — CLORAZEPATE DIPOTASSIUM 3.75 MG PO TABS
15.0000 mg | ORAL_TABLET | Freq: Every day | ORAL | Status: DC
  Administered 2015-12-15 – 2015-12-26 (×12): 15 mg via ORAL
  Filled 2015-12-15 (×12): qty 4

## 2015-12-15 MED ORDER — QUETIAPINE FUMARATE ER 50 MG PO TB24
100.0000 mg | ORAL_TABLET | Freq: Every day | ORAL | Status: DC
  Administered 2015-12-15 – 2015-12-22 (×8): 100 mg via ORAL
  Filled 2015-12-15 (×8): qty 2

## 2015-12-15 MED ORDER — HYDRALAZINE HCL 50 MG PO TABS
100.0000 mg | ORAL_TABLET | Freq: Three times a day (TID) | ORAL | Status: DC
Start: 2015-12-15 — End: 2015-12-27
  Administered 2015-12-15 – 2015-12-27 (×36): 100 mg via ORAL
  Filled 2015-12-15 (×38): qty 2

## 2015-12-15 NOTE — Progress Notes (Signed)
Physical Therapy Treatment Patient Details Name: Tara [name] MRN: [id number] DOB: [birth year] Today's Date: 12/15/2015    History of Present Illness Patient is a 65 y/o female with hx of chronic kidney failure, hx sarcoidosis, DM, chronic knee pain/stability presents with confusion. Found to have metabolic encephalopathy- due to Hypercalcemia and Uremia.    PT Comments    Patient with tangential speech with breaks only when asked questions/given cues but answered questions inappropriately and fixated on her late husband. Min A with HHA for ambulation. Pt with DOE. Current plan remains appropriate.   Follow Up Recommendations  SNF     Equipment Recommendations  None recommended by PT    Recommendations for Other Services OT consult     Precautions / Restrictions Precautions Precautions: Fall Precaution Comments: acute expressive aphagia Restrictions Weight Bearing Restrictions: No    Mobility  Bed Mobility               General bed mobility comments: OOB in chair upon arrival  Transfers Overall transfer level: Needs assistance Equipment used: None Transfers: Sit to/from Stand Sit to Stand: Min guard         General transfer comment: increased time  Ambulation/Gait Ambulation/Gait assistance: Min assist Ambulation Distance (Feet): 250 Feet Assistive device: 1 person hand held assist Gait Pattern/deviations: Step-through pattern;Decreased stride length     General Gait Details: cues for cadence and posture; increased gait velocity; cues for directional changes with slow processing at times   Stairs            Wheelchair Mobility    Modified Rankin (Stroke Patients Only)       Balance Overall balance assessment: Needs assistance Sitting-balance support: No upper extremity supported;Feet supported Sitting balance-Leahy Scale: Good     Standing balance support: Single extremity supported Standing balance-Leahy Scale: Fair                      Cognition Arousal/Alertness: Awake/alert Behavior During Therapy: Anxious;Impulsive Overall Cognitive Status: Impaired/Different from baseline Area of Impairment: Orientation;Safety/judgement;Problem solving Orientation Level: Situation       Safety/Judgement: Decreased awareness of safety   Problem Solving: Requires verbal cues General Comments: tangential speech     Exercises      General Comments General comments (skin integrity, edema, etc.): educated pt on use of call bell and phone       Pertinent Vitals/Pain Pain Assessment: Faces Faces Pain Scale: No hurt    Home Living                      Prior Function            PT Goals (current goals can now be found in the care plan section) Acute Rehab PT Goals Patient Stated Goal: none stated Progress towards PT goals: Progressing toward goals    Frequency  Min 3X/week    PT Plan Current plan remains appropriate    Co-evaluation             End of Session Equipment Utilized During Treatment: Gait belt Activity Tolerance: Patient tolerated treatment well Patient left: in chair;with call bell/phone within reach;with chair alarm set     Time: 1206-1227 PT Time Calculation (min) (ACUTE ONLY): 21 min  Charges:  $Gait Training: 8-22 mins                    G Codes:      Salina April, PTA  Pager: [phone]   12/15/2015, 1:50 PM
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
## 2015-12-15 NOTE — Progress Notes (Signed)
CSW was following for possible SNF placement- walked 250 ft with PT today and has no medical needs that would skill patient- very unlikely to get [employer] authorization  Per psych note pt is being recommended for inpatient psych placement  CSW will sign off at this time- please reconsult if needed  Domenica Reamer, [place] Worker [phone]

## 2015-12-15 NOTE — Progress Notes (Signed)
[facility]                                                                              Patient Demographics  Tara [name], is a 65 y.o. female, DOB - 05/07/1951, [id number]  Admit date - 12/09/2015   Admitting Physician Theressa Millard, MD  Outpatient Primary MD for the patient is Cathlean Cower, MD  Outpatient specialists:   LOS - 6  days    Chief Complaint  Patient presents with  . Altered Mental Status       Brief summary   Tara [name] is a 65 y.o. female with a history of Sarcoidosis, HTN, CKD who did not show up for work today and her colleagues had [employer] go to her home and she was found to be severely confused.In the ED, patient was evaluated and a CT scan of the head, Chest x-ray all were unremarkable.BMET Hypercalcemia at 14.5, and Hypokalemia at 2.3, along with a elevated BUN/Cr Of 23/2.82. She also had an elevated Troponin level at 0.16. She was referred for admission.    Assessment & Plan    Principal Problem:   Acute Metabolic encephalopathy: Likely due to profound dehydration, hypercalcemia and uremia.Improving but still has tangential speech - CT head negative, ammonia level 17 - Patient was placed on aggressive IV fluid hydration, IV pamidronate 1, miacalcin - Calcium has trended down - MRI brain Negative for any acute CVA - Per psychiatry, patient was started on Adderall XR, venlafaxine XL, clorazepate. Psych following.  Active Problems:   Sarcoidosis [facility]) - Follows Dr. Annamaria Boots, last visit 04/2015, sarcoidosis diagnosed in 2009. Patient was hospitalized in 2014 with similar symptoms, hypercalcemia and renal insufficiency and was placed on prednisone with slow taper at that time - Continue prednisone, will make pulmonology follow-up prior to discharge  Uncontrolled hypertension -  Increase hydralazine, continue Lasix, atenolol, hydralazine, clonidine    Hypercalcemia: Due to underlying sarcoidosis, not on steroids prior to  admission - Patient received aggressive IV fluid hydration, received IV pamidronate 1, continue miacalcin - Started prednisone - PTH appropriately low 13, vitamin D levels 1,25  58.6, PTH RP pending - ACE level high, 90 will need to stay on prednisone     Acute on chronic kidney failure ([place]): Due to profound dehydration and hypercalcemia, has underlying chronic kidney disease, stage IV - Creatinine slightly improving today, continue Lasix 40 mg twice a day   Escherichia coli  UTI (urinary tract infection) - Urine culture showing Escherichia coli, continue IV Rocephin    Elevated troponin: Likely due to acute renal insufficiency and hypercalcemia, demand ischemia - No chest pain or shortness of breath currently - 2-D echo showed EF of 60-65% with normal wall motion  Generalized debility - PTOT evaluation recommending skilled nursing facility versus inpatient psych  Code Status: Full CODE STATUS  DVT Prophylaxis:  Lovenox   Family Communication:  no family member at bedside   Disposition Plan:   Time Spent in minutes   25 minutes  Procedures:  CT head negative   Consultants:   Nephrology  Antimicrobials    IV Rocephin 5/26 >   Medications  Scheduled Meds: . amphetamine-dextroamphetamine  20 mg Oral Daily  . atenolol  50 mg Oral Daily  . calcitonin (salmon)  1 spray Alternating Nares Daily  . cefTRIAXone (ROCEPHIN)  IV  1 g Intravenous Q24H  . cloNIDine  0.1 mg Oral BID  . clorazepate  7.5 mg Oral QHS  . diltiazem  240 mg Oral Daily  . enoxaparin (LOVENOX) injection  40 mg Subcutaneous Q24H  . furosemide  40 mg Oral BID  . hydrALAZINE  50 mg Oral Q8H  . predniSONE  20 mg Oral Q breakfast  . sodium bicarbonate  1,300 mg Oral BID  . venlafaxine XR  75 mg Oral Q breakfast   Continuous Infusions:   PRN Meds:.acetaminophen **OR** acetaminophen, hydrALAZINE, HYDROmorphone (DILAUDID) injection, ondansetron **OR** ondansetron (ZOFRAN) IV, polyvinyl  alcohol   Antibiotics   Anti-infectives    Start     Dose/Rate Route Frequency Ordered Stop   12/10/15 2200  cefTRIAXone (ROCEPHIN) 1 g in dextrose 5 % 50 mL IVPB     1 g 100 mL/hr over 30 Minutes Intravenous Every 24 hours 12/09/15 2155     12/09/15 2200  cefTRIAXone (ROCEPHIN) 1 g in dextrose 5 % 50 mL IVPB     1 g 100 mL/hr over 30 Minutes Intravenous  Once 12/09/15 2148 12/09/15 2233        Subjective:   Tara [name] was seen and examined today.Alert and awake, now she is in the hospital, needed help. However tangential after that.   Denies any chest pain or shortness of breath. No abdominal pain, nausea, vomiting or diarrhea.   Objective:   Filed Vitals:   12/15/15 0124 12/15/15 0125 12/15/15 0224 12/15/15 0327  BP:  179/67 162/66 156/54  Pulse:  70  71  Temp: 98.2 F (36.8 C)   98.2 F (36.8 C)  TempSrc: Oral   Oral  Resp:  17  17  Height:      Weight:      SpO2:  98%  98%    Intake/Output Summary (Last 24 hours) at 12/15/15 1357 Last data filed at 12/15/15 0900  Gross per 24 hour  Intake    240 ml  Output   2300 ml  Net  -2060 ml     Wt Readings from Last 3 Encounters:  12/09/15 106.595 kg (235 lb)  11/18/15 107.956 kg (238 lb)  11/01/15 107.956 kg (238 lb)     Exam  General: Alert and oriented x 2, Tangential speech NAD  HEENT:   Neck:   Cardiovascular: S1 S2 Clear, RRR  Respiratory:  Decreased breath sounds at the bases  Gastrointestinal: Soft, nontender, nondistended, + bowel sounds  Ext: no cyanosis clubbing or edema   Neuro: strength 5/5 in upper and lower extremities, ambulating  Skin: No rashes  Psych: confused   Data Reviewed:  I have personally reviewed following labs and imaging studies  Micro Results Recent Results (from the past 240 hour(s))  Urine culture     Status: Abnormal   Collection Time: 12/09/15  6:37 PM  Result Value Ref Range Status   Specimen Description URINE, RANDOM  Final   Special Requests NONE   Final   Culture >=100,000 COLONIES/mL ESCHERICHIA COLI (A)  Final   Report Status 12/12/2015 FINAL  Final   Organism ID, Bacteria ESCHERICHIA COLI (A)  Final      Susceptibility   Escherichia coli - MIC*    AMPICILLIN <=2 SENSITIVE Sensitive     CEFAZOLIN <=4 SENSITIVE Sensitive  CEFTRIAXONE <=1 SENSITIVE Sensitive     CIPROFLOXACIN <=0.25 SENSITIVE Sensitive     GENTAMICIN <=1 SENSITIVE Sensitive     IMIPENEM <=0.25 SENSITIVE Sensitive     NITROFURANTOIN <=16 SENSITIVE Sensitive     TRIMETH/SULFA <=20 SENSITIVE Sensitive     AMPICILLIN/SULBACTAM <=2 SENSITIVE Sensitive     PIP/TAZO <=4 SENSITIVE Sensitive     * >=100,000 COLONIES/mL ESCHERICHIA COLI    Radiology Reports Dg Chest 2 View  12/09/2015  CLINICAL DATA:  Altered mental status. EXAM: CHEST  2 VIEW COMPARISON:  October 12, 2015. FINDINGS: The heart size and mediastinal contours are within normal limits. Both lungs are clear. No pneumothorax or pleural effusion is noted. The visualized skeletal structures are unremarkable. IMPRESSION: No active cardiopulmonary disease. Electronically Signed   By: Marijo Conception, M.D.   On: 12/09/2015 19:44   Ct Head Wo Contrast  12/09/2015  CLINICAL DATA:  Altered mental status. EXAM: CT HEAD WITHOUT CONTRAST TECHNIQUE: Contiguous axial images were obtained from the base of the skull through the vertex without intravenous contrast. COMPARISON:  CT scan of July 23, 2012. FINDINGS: Bony calvarium appears intact. No mass effect or midline shift is noted. Ventricular size is within normal limits. There is no evidence of mass lesion, hemorrhage or acute infarction. IMPRESSION: Normal head CT. Electronically Signed   By: Marijo Conception, M.D.   On: 12/09/2015 19:55   Mr Brain Wo Contrast  12/12/2015  CLINICAL DATA:  65 year old hypertensive female with history of sarcoidosis, chronic kidney disease and migraines. Episode of slurred speech with difficulty finding words. Confusion. Subsequent  encounter. EXAM: MRI HEAD WITHOUT CONTRAST TECHNIQUE: Multiplanar, multiecho pulse sequences of the brain and surrounding structures were obtained without intravenous contrast. COMPARISON:  12/09/2015 CT.  05/08/2008 MR. FINDINGS: Exam is significantly motion degraded. No acute infarct or intracranial hemorrhage. Minimal nonspecific white matter changes may be related to small vessel disease or migraine headaches. Mild global atrophy without hydrocephalus. No intracranial mass lesion noted on this unenhanced exam. Major intracranial vascular structures are patent. No acute orbital abnormality noted. Cervical medullary junction appears intact. IMPRESSION: Significantly motion degraded exam without evidence of acute infarct. Electronically Signed   By: Genia Del M.D.   On: 12/12/2015 14:35    Lab Data:  CBC:  Recent Labs Lab 12/10/15 0555 12/11/15 0453 12/12/15 0440 12/13/15 0659 12/14/15 0625  WBC 9.4 16.6* 14.2* 9.6 10.1  HGB 11.3* 11.3* 10.7* 9.9* 10.5*  HCT 34.5* 34.2* 33.8* 31.0* 33.0*  MCV 79.3 80.5 82.6 81.2 80.5  PLT 255 262 253 276 [id number]   Basic Metabolic Panel:  Recent Labs Lab 12/10/15 0015  12/10/15 1201  12/11/15 0453 12/12/15 0440 12/13/15 0659 12/14/15 0625 12/15/15 0531  NA  --   < >  --   < > 140 139 139 141 137  K  --   < >  --   < > 3.6 3.4* 3.6 3.2* 3.7  CL  --   < >  --   < > 109 109 108 105 104  CO2  --   < >  --   < > 20* 19* 22 26 24   GLUCOSE  --   < >  --   < > 133* 92 127* 105* 103*  BUN  --   < >  --   < > 21* 22* 30* 32* 33*  CREATININE  --   < >  --   < > 2.30* 2.30* 1.94* 2.14* 2.01*  CALCIUM  --   < >  --   < > 11.3* 10.3 9.9 9.7 9.3  MG 1.9  --   --   --   --   --   --   --   --   PHOS  --   --  1.3*  --  2.4* 1.5* 2.3* 2.9  --   < > = values in this interval not displayed. GFR: Estimated Creatinine Clearance: 35.4 mL/min (by C-G formula based on Cr of 2.01). Liver Function Tests:  Recent Labs Lab 12/09/15 1850 12/11/15 0453  12/12/15 0440 12/13/15 0659 12/14/15 0625  AST 34  --   --   --   --   ALT 27  --   --   --   --   ALKPHOS 71  --   --   --   --   BILITOT 0.5  --   --   --   --   PROT 7.8  --   --   --   --   ALBUMIN 3.9 3.4* 3.2* 3.1* 3.3*   No results for input(s): LIPASE, AMYLASE in the last 168 hours.  Recent Labs Lab 12/09/15 2014  AMMONIA 17   Coagulation Profile: No results for input(s): INR, PROTIME in the last 168 hours. Cardiac Enzymes:  Recent Labs Lab 12/10/15 0015 12/10/15 0555 12/10/15 1105  TROPONINI 0.13* 0.12* 0.18*   BNP (last 3 results) No results for input(s): PROBNP in the last 8760 hours. HbA1C: No results for input(s): HGBA1C in the last 72 hours. CBG:  Recent Labs Lab 12/09/15 1832  GLUCAP 86   Lipid Profile: No results for input(s): CHOL, HDL, LDLCALC, TRIG, CHOLHDL, LDLDIRECT in the last 72 hours. Thyroid Function Tests: No results for input(s): TSH, T4TOTAL, FREET4, T3FREE, THYROIDAB in the last 72 hours. Anemia Panel: No results for input(s): VITAMINB12, FOLATE, FERRITIN, TIBC, IRON, RETICCTPCT in the last 72 hours. Urine analysis:    Component Value Date/Time   COLORURINE YELLOW 12/09/2015 1837   APPEARANCEUR CLOUDY* 12/09/2015 1837   LABSPEC 1.013 12/09/2015 1837   PHURINE 6.5 12/09/2015 1837   GLUCOSEU NEGATIVE 12/09/2015 1837   GLUCOSEU NEGATIVE 09/27/2015 0849   HGBUR SMALL* 12/09/2015 1837   BILIRUBINUR NEGATIVE 12/09/2015 1837   [place] 12/09/2015 1837   PROTEINUR 30* 12/09/2015 1837   UROBILINOGEN 0.2 09/27/2015 0849   NITRITE NEGATIVE 12/09/2015 1837   LEUKOCYTESUR MODERATE* 12/09/2015 1837     RAI,RIPUDEEP M.D. [facility] 12/15/2015, 1:57 PM  Pager: [phone] Between 7am to 7pm - call Pager - [phone]  After 7pm go to [email or web address] - password [email or web address]  Call night coverage person covering after 7pm

## 2015-12-16 LAB — BASIC METABOLIC PANEL
Anion gap: 7 (ref 5–15)
BUN: 37 mg/dL — AB (ref 6–20)
CHLORIDE: 105 mmol/L (ref 101–111)
CO2: 25 mmol/L (ref 22–32)
Calcium: 8.8 mg/dL — ABNORMAL LOW (ref 8.9–10.3)
Creatinine, Ser: 2.04 mg/dL — ABNORMAL HIGH (ref 0.44–1.00)
GFR calc Af Amer: 29 mL/min — ABNORMAL LOW (ref >60)
GFR calc non Af Amer: 25 mL/min — ABNORMAL LOW (ref >60)
GLUCOSE: 100 mg/dL — AB (ref 65–99)
POTASSIUM: 3.7 mmol/L (ref 3.5–5.1)
Sodium: 137 mmol/L (ref 135–145)

## 2015-12-16 NOTE — Clinical Social Work Psych Note (Signed)
Covering Psych LCSW has completed appropriate referral to [facility] Inpatient placement.  Awaiting referral review.  Lubertha Sayres, [facility]: [phone] Surgical: [phone]

## 2015-12-16 NOTE — Progress Notes (Signed)
[facility]                                                                              Patient Demographics  Tara [name], is a 65 y.o. female, DOB - 10-May-1951, [id number]  Admit date - 12/09/2015   Admitting Physician Ron Parker, MD  Outpatient Primary MD for the patient is Oliver Barre, MD  Outpatient specialists:   LOS - 7  days    Chief Complaint  Patient presents with  . Altered Mental Status       Brief summary   Tara [name] is a 65 y.o. female with a history of Sarcoidosis, HTN, CKD who did not show up for work today and her colleagues had [employer] go to her home and she was found to be severely confused.In the ED, patient was evaluated and a CT scan of the head, Chest x-ray all were unremarkable.BMET Hypercalcemia at 14.5, and Hypokalemia at 2.3, along with a elevated BUN/Cr Of 23/2.82. She also had an elevated Troponin level at 0.16. She was referred for admission.  Patient was seen by nephrology, calcium has been normalized after aggressive IV fluid hydration, diaphoresis, pamidronate, Miacalcin. Nephrology has signed off. She was also placed on prednisone and she will need to continue prednisone and outpatient follow-up with Dr. Maple Hudson, her pulmonologist. Patient is being closely followed by psychiatry service, patient started on Seroquel, Ativan, venlafaxine, clorazepate. She will need inpatient psych when bed available. At this point, patient is medically clear.   Assessment & Plan    Principal Problem:   Acute Metabolic encephalopathy: Likely due to profound dehydration, hypercalcemia and uremia.Improving but still has tangential speech - CT head negative, ammonia level 17 - Patient was placed on aggressive IV fluid hydration, IV pamidronate 1, miacalcin - Calcium has trended down, Now normalized. - MRI brain Negative for any acute CVA - Per psychiatry, patient was started on Adderall XR, venlafaxine XL, clorazepate, started  on Seroquel on 6/1. Psych following. Patient is very tangential and rambling today.  Active Problems:   Sarcoidosis [facility]) - Follows Dr. Maple Hudson, last visit 04/2015, sarcoidosis diagnosed in 2009. Patient was hospitalized in 2014 with similar symptoms, hypercalcemia and renal insufficiency and was placed on prednisone with slow taper at that time - Continue prednisone, will make pulmonology follow-up prior to discharge  Uncontrolled hypertension - BP now better controlled, continue Lasix, atenolol, hydralazine, clonidine    Hypercalcemia: Due to underlying sarcoidosis, not on steroids prior to admission - Patient received aggressive IV fluid hydration, received IV pamidronate 1, continue miacalcin - Started prednisone - PTH appropriately low 13, vitamin D levels 1,25  58.6, PTH RP pending - ACE level high, 90 will need to stay on prednisone     Acute on chronic kidney failure ([facility]): Due to profound dehydration and hypercalcemia, has underlying chronic kidney disease, stage IV - Creatinine slightly improving today, continue Lasix 40 mg twice a day   Escherichia coli  UTI (urinary tract infection) - Urine culture showing Escherichia coli, continue IV Rocephin    Elevated troponin: Likely due to acute renal insufficiency and hypercalcemia, demand ischemia - No chest pain or shortness  of breath currently - 2-D echo showed EF of 60-65% with normal wall motion  Generalized debility - Patient will need inpatient psych at the time of discharge  Code Status: Full CODE STATUS  DVT Prophylaxis:  Lovenox   Family Communication:  no family member at bedside   Disposition Plan:   Time Spent in minutes   25 minutes  Procedures:  CT head negative   Consultants:   Nephrology  Antimicrobials    IV Rocephin 5/26 >   Medications  Scheduled Meds: . amphetamine-dextroamphetamine  30 mg Oral Daily  . atenolol  50 mg Oral Daily  . calcitonin (salmon)  1 spray Alternating Nares Daily    . cefTRIAXone (ROCEPHIN)  IV  1 g Intravenous Q24H  . cloNIDine  0.1 mg Oral BID  . clorazepate  15 mg Oral QHS  . diltiazem  240 mg Oral Daily  . enoxaparin (LOVENOX) injection  40 mg Subcutaneous Q24H  . furosemide  40 mg Oral BID  . hydrALAZINE  100 mg Oral Q8H  . predniSONE  20 mg Oral Q breakfast  . QUEtiapine  100 mg Oral QHS  . sodium bicarbonate  1,300 mg Oral BID  . venlafaxine XR  75 mg Oral Q breakfast   Continuous Infusions:   PRN Meds:.acetaminophen **OR** acetaminophen, hydrALAZINE, HYDROmorphone (DILAUDID) injection, ondansetron **OR** ondansetron (ZOFRAN) IV, polyvinyl alcohol   Antibiotics   Anti-infectives    Start     Dose/Rate Route Frequency Ordered Stop   12/10/15 2200  cefTRIAXone (ROCEPHIN) 1 g in dextrose 5 % 50 mL IVPB     1 g 100 mL/hr over 30 Minutes Intravenous Every 24 hours 12/09/15 2155     12/09/15 2200  cefTRIAXone (ROCEPHIN) 1 g in dextrose 5 % 50 mL IVPB     1 g 100 mL/hr over 30 Minutes Intravenous  Once 12/09/15 2148 12/09/15 2233        Subjective:   Tara [name] was seen and examined today. Patient is alert and oriented however very tangential in her conversations. No abdominal pain, nausea, vomiting or diarrhea. No fevers or chills.  Objective:   Filed Vitals:   12/16/15 0224 12/16/15 0646 12/16/15 0845 12/16/15 1209  BP: 102/53 105/58 131/57 139/65  Pulse: 63 53 50 60  Temp:  97.4 F (36.3 C) 97.8 F (36.6 C) 97.9 F (36.6 C)  TempSrc:      Resp: 18 18 16 17   Height:      Weight:      SpO2: 100% 98% 100% 100%    Intake/Output Summary (Last 24 hours) at 12/16/15 1400 Last data filed at 12/16/15 1030  Gross per 24 hour  Intake    640 ml  Output      0 ml  Net    640 ml     Wt Readings from Last 3 Encounters:  12/09/15 106.595 kg (235 lb)  11/18/15 107.956 kg (238 lb)  11/01/15 107.956 kg (238 lb)     Exam  General: Alert and oriented x 2, Tangential speech, Rambling  HEENT:   Neck:    Cardiovascular: S1 S2 Clear, RRR  Respiratory:  Clear to auscultation bilaterally  Gastrointestinal: Soft, nontender, nondistended, + bowel sounds  Ext: no cyanosis clubbing or edema   Neuro: no new deficits  Skin: No rashes  Psych: confused, tangential and rambling   Data Reviewed:  I have personally reviewed following labs and imaging studies  Micro Results Recent Results (from the past 240 hour(s))  Urine  culture     Status: Abnormal   Collection Time: 12/09/15  6:37 PM  Result Value Ref Range Status   Specimen Description URINE, RANDOM  Final   Special Requests NONE  Final   Culture >=100,000 COLONIES/mL ESCHERICHIA COLI (A)  Final   Report Status 12/12/2015 FINAL  Final   Organism ID, Bacteria ESCHERICHIA COLI (A)  Final      Susceptibility   Escherichia coli - MIC*    AMPICILLIN <=2 SENSITIVE Sensitive     CEFAZOLIN <=4 SENSITIVE Sensitive     CEFTRIAXONE <=1 SENSITIVE Sensitive     CIPROFLOXACIN <=0.25 SENSITIVE Sensitive     GENTAMICIN <=1 SENSITIVE Sensitive     IMIPENEM <=0.25 SENSITIVE Sensitive     NITROFURANTOIN <=16 SENSITIVE Sensitive     TRIMETH/SULFA <=20 SENSITIVE Sensitive     AMPICILLIN/SULBACTAM <=2 SENSITIVE Sensitive     PIP/TAZO <=4 SENSITIVE Sensitive     * >=100,000 COLONIES/mL ESCHERICHIA COLI    Radiology Reports Dg Chest 2 View  12/09/2015  CLINICAL DATA:  Altered mental status. EXAM: CHEST  2 VIEW COMPARISON:  October 12, 2015. FINDINGS: The heart size and mediastinal contours are within normal limits. Both lungs are clear. No pneumothorax or pleural effusion is noted. The visualized skeletal structures are unremarkable. IMPRESSION: No active cardiopulmonary disease. Electronically Signed   By: Lupita Raider, M.D.   On: 12/09/2015 19:44   Ct Head Wo Contrast  12/09/2015  CLINICAL DATA:  Altered mental status. EXAM: CT HEAD WITHOUT CONTRAST TECHNIQUE: Contiguous axial images were obtained from the base of the skull through the vertex  without intravenous contrast. COMPARISON:  CT scan of July 23, 2012. FINDINGS: Bony calvarium appears intact. No mass effect or midline shift is noted. Ventricular size is within normal limits. There is no evidence of mass lesion, hemorrhage or acute infarction. IMPRESSION: Normal head CT. Electronically Signed   By: Lupita Raider, M.D.   On: 12/09/2015 19:55   Mr Brain Wo Contrast  12/12/2015  CLINICAL DATA:  65 year old hypertensive female with history of sarcoidosis, chronic kidney disease and migraines. Episode of slurred speech with difficulty finding words. Confusion. Subsequent encounter. EXAM: MRI HEAD WITHOUT CONTRAST TECHNIQUE: Multiplanar, multiecho pulse sequences of the brain and surrounding structures were obtained without intravenous contrast. COMPARISON:  12/09/2015 CT.  05/08/2008 MR. FINDINGS: Exam is significantly motion degraded. No acute infarct or intracranial hemorrhage. Minimal nonspecific white matter changes may be related to small vessel disease or migraine headaches. Mild global atrophy without hydrocephalus. No intracranial mass lesion noted on this unenhanced exam. Major intracranial vascular structures are patent. No acute orbital abnormality noted. Cervical medullary junction appears intact. IMPRESSION: Significantly motion degraded exam without evidence of acute infarct. Electronically Signed   By: Lacy Duverney M.D.   On: 12/12/2015 14:35    Lab Data:  CBC:  Recent Labs Lab 12/10/15 0555 12/11/15 0453 12/12/15 0440 12/13/15 0659 12/14/15 0625  WBC 9.4 16.6* 14.2* 9.6 10.1  HGB 11.3* 11.3* 10.7* 9.9* 10.5*  HCT 34.5* 34.2* 33.8* 31.0* 33.0*  MCV 79.3 80.5 82.6 81.2 80.5  PLT 255 262 253 276 338   Basic Metabolic Panel:  Recent Labs Lab 12/10/15 0015  12/10/15 1201  12/11/15 0453 12/12/15 0440 12/13/15 0659 12/14/15 0625 12/15/15 0531 12/16/15 0503  NA  --   < >  --   < > 140 139 139 141 137 137  K  --   < >  --   < > 3.6 3.4* 3.6  3.2* 3.7 3.7   CL  --   < >  --   < > 109 109 108 105 104 105  CO2  --   < >  --   < > 20* 19* 22 26 24 25   GLUCOSE  --   < >  --   < > 133* 92 127* 105* 103* 100*  BUN  --   < >  --   < > 21* 22* 30* 32* 33* 37*  CREATININE  --   < >  --   < > 2.30* 2.30* 1.94* 2.14* 2.01* 2.04*  CALCIUM  --   < >  --   < > 11.3* 10.3 9.9 9.7 9.3 8.8*  MG 1.9  --   --   --   --   --   --   --   --   --   PHOS  --   --  1.3*  --  2.4* 1.5* 2.3* 2.9  --   --   < > = values in this interval not displayed. GFR: Estimated Creatinine Clearance: 34.8 mL/min (by C-G formula based on Cr of 2.04). Liver Function Tests:  Recent Labs Lab 12/09/15 1850 12/11/15 0453 12/12/15 0440 12/13/15 0659 12/14/15 0625  AST 34  --   --   --   --   ALT 27  --   --   --   --   ALKPHOS 71  --   --   --   --   BILITOT 0.5  --   --   --   --   PROT 7.8  --   --   --   --   ALBUMIN 3.9 3.4* 3.2* 3.1* 3.3*   No results for input(s): LIPASE, AMYLASE in the last 168 hours.  Recent Labs Lab 12/09/15 2014  AMMONIA 17   Coagulation Profile: No results for input(s): INR, PROTIME in the last 168 hours. Cardiac Enzymes:  Recent Labs Lab 12/10/15 0015 12/10/15 0555 12/10/15 1105  TROPONINI 0.13* 0.12* 0.18*   BNP (last 3 results) No results for input(s): PROBNP in the last 8760 hours. HbA1C: No results for input(s): HGBA1C in the last 72 hours. CBG:  Recent Labs Lab 12/09/15 1832  GLUCAP 86   Lipid Profile: No results for input(s): CHOL, HDL, LDLCALC, TRIG, CHOLHDL, LDLDIRECT in the last 72 hours. Thyroid Function Tests: No results for input(s): TSH, T4TOTAL, FREET4, T3FREE, THYROIDAB in the last 72 hours. Anemia Panel: No results for input(s): VITAMINB12, FOLATE, FERRITIN, TIBC, IRON, RETICCTPCT in the last 72 hours. Urine analysis:    Component Value Date/Time   COLORURINE YELLOW 12/09/2015 1837   APPEARANCEUR CLOUDY* 12/09/2015 1837   LABSPEC 1.013 12/09/2015 1837   PHURINE 6.5 12/09/2015 1837   GLUCOSEU NEGATIVE  12/09/2015 1837   GLUCOSEU NEGATIVE 09/27/2015 0849   HGBUR SMALL* 12/09/2015 1837   BILIRUBINUR NEGATIVE 12/09/2015 1837   KETONESUR NEGATIVE 12/09/2015 1837   PROTEINUR 30* 12/09/2015 1837   UROBILINOGEN 0.2 09/27/2015 0849   NITRITE NEGATIVE 12/09/2015 1837   LEUKOCYTESUR MODERATE* 12/09/2015 1837     Adelia Baptista M.D. [facility] 12/16/2015, 2:00 PM  Pager: [phone] Between 7am to 7pm - call Pager - [phone]  After 7pm go to [email or web address] - password [email or web address]  Call night coverage person covering after 7pm

## 2015-12-17 LAB — BASIC METABOLIC PANEL
ANION GAP: 11 (ref 5–15)
BUN: 43 mg/dL — ABNORMAL HIGH (ref 6–20)
CALCIUM: 9 mg/dL (ref 8.9–10.3)
CO2: 24 mmol/L (ref 22–32)
Chloride: 102 mmol/L (ref 101–111)
Creatinine, Ser: 2.01 mg/dL — ABNORMAL HIGH (ref 0.44–1.00)
GFR, EST AFRICAN AMERICAN: 29 mL/min — AB (ref >60)
GFR, EST NON AFRICAN AMERICAN: 25 mL/min — AB (ref >60)
Glucose, Bld: 114 mg/dL — ABNORMAL HIGH (ref 65–99)
POTASSIUM: 3.6 mmol/L (ref 3.5–5.1)
Sodium: 137 mmol/L (ref 135–145)

## 2015-12-17 LAB — VITAMIN B12: Vitamin B-12: 381 pg/mL (ref 180–914)

## 2015-12-17 MED ORDER — CYANOCOBALAMIN 1000 MCG/ML IJ SOLN
1000.0000 ug | Freq: Once | INTRAMUSCULAR | Status: AC
  Administered 2015-12-17: 1000 ug via INTRAMUSCULAR
  Filled 2015-12-17: qty 1

## 2015-12-17 MED ORDER — SODIUM CHLORIDE 0.9 % IV SOLN
500.0000 mg | INTRAVENOUS | Status: DC
  Administered 2015-12-17 – 2015-12-19 (×3): 500 mg via INTRAVENOUS
  Filled 2015-12-17 (×4): qty 5

## 2015-12-17 NOTE — Progress Notes (Signed)
PROGRESS NOTE  Tara [name] [id number] DOB: 03-Nov-1950 DOA: 12/09/2015 PCP: Cathlean Cower, MD   LOS: 8 days   Brief Narrative: Tara [name] is a 65 y.o. female with a history of Sarcoidosis, HTN, CKD who did not show up for work today and her colleagues had [employer] go to her home and she was found to be severely confused.In the ED, patient was evaluated and a CT scan of the head, Chest x-ray all were unremarkable.BMET Hypercalcemia at 14.5, and Hypokalemia at 2.3, along with a elevated BUN/Cr Of 23/2.82. She also had an elevated Troponin level at 0.16.She was referred for admission.Patient was seen by nephrology, calcium has been normalized after aggressive IV fluid hydration, diaphoresis, pamidronate, Miacalcin. Nephrology has signed off. She was also placed on prednisone and she will need to continue prednisone and outpatient follow-up with Dr. Annamaria Boots, her pulmonologist. Patient is being closely followed by psychiatry service, patient started on Seroquel, Ativan, venlafaxine, clorazepate. She will need inpatient psych when bed available. At this point, patient is medically clear.  Assessment & Plan: Principal Problem:   Metabolic encephalopathy Active Problems:   Sarcoidosis ([place])   Essential hypertension   Hypercalcemia   Acute on chronic kidney failure (HCC)   UTI (urinary tract infection)   CKD (chronic kidney disease) stage 4, GFR 15-29 ml/min (HCC)   Hypokalemia   Elevated troponin   Acute kidney injury ([place])   Bipolar I disorder, most recent episode (or current) manic ([place])   Acute Metabolic encephalopathy  - Likely due to profound dehydration, hypercalcemia and uremia. Improving but still has tangential speech - CT head negative, ammonia level 17 - Patient was placed on aggressive IV fluid hydration, IV pamidronate 1 - Calcium has trended down, Now normalized. - MRI brain Negative for any acute CVA - Per psychiatry, patient was started on Adderall XR, venlafaxine XL,  clorazepate, started on Seroquel on 6/1. Psych following. Patient is very tangential and rambling today. - check B1, B12. Empiric thiamine and B12 until resulted  Sarcoidosis ([place]) - Follows Dr. Annamaria Boots, last visit 04/2015, sarcoidosis diagnosed in 2009. Patient was hospitalized in 2014 with similar symptoms, hypercalcemia and renal insufficiency and was placed on prednisone with slow taper at that time - Continue prednisone, will make pulmonology follow-up prior to discharge  Uncontrolled hypertension - BP now better controlled, continue Lasix, atenolol, hydralazine, clonidine  Hypercalcemia: Due to underlying sarcoidosis, not on steroids prior to admission - Patient received aggressive IV fluid hydration, received IV pamidronate 1, continue miacalcin - Started prednisone - PTH appropriately low 13, vitamin D levels 1,25 58.6, PTH RP pending - ACE level high, 90 will need to stay on prednisone  Acute on chronic kidney failure ([place]): Due to profound dehydration and hypercalcemia, has underlying chronic kidney disease, stage IV - Creatinine stable, continue Lasix 40 mg twice a day   Escherichia coli UTI (urinary tract infection) - Urine culture showing Escherichia coli, s/p 8 days of Rocephin, discontinue today   Elevated troponin: Likely due to acute renal insufficiency and hypercalcemia, demand ischemia - No chest pain or shortness of breath currently - 2-D echo showed EF of 60-65% with normal wall motion  Generalized debility - Patient will need inpatient psych at the time of discharge   DVT prophylaxis: Lovenox Code Status: Full Family Communication: called sister several times, no answer, could not leave message due to full mailbox. Will try later. Disposition Plan: inpatient psych  Consultants:   Psychiatry   Nephrology  Procedures:   None  Antimicrobials:  Ceftriaxone 5/26 >> 6/3   Subjective: - confused, tangential speech, confabulating - no chest pain,  shortness of breath, no abdominal pain, nausea or vomiting.   Objective: Filed Vitals:   12/17/15 0021 12/17/15 0422 12/17/15 0422 12/17/15 0842  BP: 150/64  143/56 149/68  Pulse: 73  65 93  Temp:  98.1 F (36.7 C)    TempSrc:  Oral    Resp: 18  18 17   Height:      Weight:      SpO2: 98%  98% 99%    Intake/Output Summary (Last 24 hours) at 12/17/15 1125 Last data filed at 12/17/15 [id number]  Gross per 24 hour  Intake   1010 ml  Output      0 ml  Net   1010 ml   Filed Weights   12/09/15 1825  Weight: 106.595 kg (235 lb)    Examination: Constitutional: NAD Filed Vitals:   12/17/15 0021 12/17/15 0422 12/17/15 0422 12/17/15 0842  BP: 150/64  143/56 149/68  Pulse: 73  65 93  Temp:  98.1 F (36.7 C)    TempSrc:  Oral    Resp: 18  18 17   Height:      Weight:      SpO2: 98%  98% 99%   Eyes: PERRL, lids and conjunctivae normal ENMT: Mucous membranes are moist.  Respiratory: clear to auscultation bilaterally, no wheezing, no crackles. Normal respiratory effort. No accessory muscle use.  Cardiovascular: Regular rate and rhythm, no murmurs / rubs / gallops. No LE edema. Abdomen: no tenderness. Bowel sounds positive.  Neurologic: CN 2-12 grossly intact. Strength 5/5 in all 4.    Data Reviewed: I have personally reviewed following labs and imaging studies  CBC:  Recent Labs Lab 12/11/15 0453 12/12/15 0440 12/13/15 0659 12/14/15 0625  WBC 16.6* 14.2* 9.6 10.1  HGB 11.3* 10.7* 9.9* 10.5*  HCT 34.2* 33.8* 31.0* 33.0*  MCV 80.5 82.6 81.2 80.5  PLT 262 253 276 [id number]   Basic Metabolic Panel:  Recent Labs Lab 12/10/15 1201  12/11/15 0453 12/12/15 0440 12/13/15 0659 12/14/15 0625 12/15/15 0531 12/16/15 0503 12/17/15 0549  NA  --   < > 140 139 139 141 137 137 137  K  --   < > 3.6 3.4* 3.6 3.2* 3.7 3.7 3.6  CL  --   < > 109 109 108 105 104 105 102  CO2  --   < > 20* 19* 22 26 24 25 24   GLUCOSE  --   < > 133* 92 127* 105* 103* 100* 114*  BUN  --   < > 21* 22* 30*  32* 33* 37* 43*  CREATININE  --   < > 2.30* 2.30* 1.94* 2.14* 2.01* 2.04* 2.01*  CALCIUM  --   < > 11.3* 10.3 9.9 9.7 9.3 8.8* 9.0  PHOS 1.3*  --  2.4* 1.5* 2.3* 2.9  --   --   --   < > = values in this interval not displayed. GFR: Estimated Creatinine Clearance: 35.4 mL/min (by C-G formula based on Cr of 2.01). Liver Function Tests:  Recent Labs Lab 12/11/15 0453 12/12/15 0440 12/13/15 0659 12/14/15 0625  ALBUMIN 3.4* 3.2* 3.1* 3.3*   No results for input(s): LIPASE, AMYLASE in the last 168 hours. No results for input(s): AMMONIA in the last 168 hours. Coagulation Profile: No results for input(s): INR, PROTIME in the last 168 hours. Cardiac Enzymes: No results for input(s): CKTOTAL, CKMB, CKMBINDEX, TROPONINI in the last  168 hours. BNP (last 3 results) No results for input(s): PROBNP in the last 8760 hours. HbA1C: No results for input(s): HGBA1C in the last 72 hours. CBG: No results for input(s): GLUCAP in the last 168 hours. Lipid Profile: No results for input(s): CHOL, HDL, LDLCALC, TRIG, CHOLHDL, LDLDIRECT in the last 72 hours. Thyroid Function Tests: No results for input(s): TSH, T4TOTAL, FREET4, T3FREE, THYROIDAB in the last 72 hours. Anemia Panel:  Recent Labs  12/17/15 0948  VITAMINB12 381   Urine analysis:    Component Value Date/Time   COLORURINE YELLOW 12/09/2015 1837   APPEARANCEUR CLOUDY* 12/09/2015 1837   LABSPEC 1.013 12/09/2015 1837   PHURINE 6.5 12/09/2015 1837   GLUCOSEU NEGATIVE 12/09/2015 1837   GLUCOSEU NEGATIVE 09/27/2015 0849   HGBUR SMALL* 12/09/2015 1837   BILIRUBINUR NEGATIVE 12/09/2015 [place] 12/09/2015 1837   PROTEINUR 30* 12/09/2015 1837   UROBILINOGEN 0.2 09/27/2015 0849   NITRITE NEGATIVE 12/09/2015 1837   LEUKOCYTESUR MODERATE* 12/09/2015 1837   Sepsis Labs: Invalid input(s): PROCALCITONIN, LACTICIDVEN  Recent Results (from the past 240 hour(s))  Urine culture     Status: Abnormal   Collection Time:  12/09/15  6:37 PM  Result Value Ref Range Status   Specimen Description URINE, RANDOM  Final   Special Requests NONE  Final   Culture >=100,000 COLONIES/mL ESCHERICHIA COLI (A)  Final   Report Status 12/12/2015 FINAL  Final   Organism ID, Bacteria ESCHERICHIA COLI (A)  Final      Susceptibility   Escherichia coli - MIC*    AMPICILLIN <=2 SENSITIVE Sensitive     CEFAZOLIN <=4 SENSITIVE Sensitive     CEFTRIAXONE <=1 SENSITIVE Sensitive     CIPROFLOXACIN <=0.25 SENSITIVE Sensitive     GENTAMICIN <=1 SENSITIVE Sensitive     IMIPENEM <=0.25 SENSITIVE Sensitive     NITROFURANTOIN <=16 SENSITIVE Sensitive     TRIMETH/SULFA <=20 SENSITIVE Sensitive     AMPICILLIN/SULBACTAM <=2 SENSITIVE Sensitive     PIP/TAZO <=4 SENSITIVE Sensitive     * >=100,000 COLONIES/mL ESCHERICHIA COLI      Radiology Studies: No results found.   Scheduled Meds: . amphetamine-dextroamphetamine  30 mg Oral Daily  . atenolol  50 mg Oral Daily  . calcitonin (salmon)  1 spray Alternating Nares Daily  . cefTRIAXone (ROCEPHIN)  IV  1 g Intravenous Q24H  . cloNIDine  0.1 mg Oral BID  . clorazepate  15 mg Oral QHS  . cyanocobalamin  1,000 mcg Intramuscular Once  . diltiazem  240 mg Oral Daily  . enoxaparin (LOVENOX) injection  40 mg Subcutaneous Q24H  . furosemide  40 mg Oral BID  . hydrALAZINE  100 mg Oral Q8H  . predniSONE  20 mg Oral Q breakfast  . QUEtiapine  100 mg Oral QHS  . sodium bicarbonate  1,300 mg Oral BID  . thiamine IV  500 mg Intravenous Q24H  . venlafaxine XR  75 mg Oral Q breakfast   Continuous Infusions:    Marzetta Board, MD, PhD [facility] Pager [phone]  If 7PM-7AM, please contact night-coverage [email or web address] Password [email or web address] 12/17/2015, 11:25 AM

## 2015-12-18 LAB — CBC
HCT: 36.1 % (ref 36.0–46.0)
HEMOGLOBIN: 11.5 g/dL — AB (ref 12.0–15.0)
MCH: 25.8 pg — AB (ref 26.0–34.0)
MCHC: 31.9 g/dL (ref 30.0–36.0)
MCV: 81.1 fL (ref 78.0–100.0)
PLATELETS: 321 10*3/uL (ref 150–400)
RBC: 4.45 MIL/uL (ref 3.87–5.11)
RDW: 15.9 % — ABNORMAL HIGH (ref 11.5–15.5)
WBC: 10.5 10*3/uL (ref 4.0–10.5)

## 2015-12-18 LAB — BASIC METABOLIC PANEL
ANION GAP: 10 (ref 5–15)
BUN: 43 mg/dL — ABNORMAL HIGH (ref 6–20)
CALCIUM: 9 mg/dL (ref 8.9–10.3)
CO2: 25 mmol/L (ref 22–32)
Chloride: 102 mmol/L (ref 101–111)
Creatinine, Ser: 2.14 mg/dL — ABNORMAL HIGH (ref 0.44–1.00)
GFR calc non Af Amer: 23 mL/min — ABNORMAL LOW (ref >60)
GFR, EST AFRICAN AMERICAN: 27 mL/min — AB (ref >60)
Glucose, Bld: 131 mg/dL — ABNORMAL HIGH (ref 65–99)
Potassium: 3.5 mmol/L (ref 3.5–5.1)
SODIUM: 137 mmol/L (ref 135–145)

## 2015-12-18 LAB — PTH-RELATED PEPTIDE: PTH-related peptide: <1.1 pmol/L

## 2015-12-18 NOTE — Progress Notes (Signed)
Error--this was charted on the wrong pt-these stats are not for this pt

## 2015-12-18 NOTE — Progress Notes (Signed)
PROGRESS NOTE  Tara [name] [id number] DOB: 07/17/1950 DOA: 12/09/2015 PCP: Cathlean Cower, MD   LOS: 9 days   Brief Narrative: Tara [name] is a 65 y.o. female with a history of Sarcoidosis, HTN, CKD who did not show up for work today and her colleagues had [employer] go to her home and she was found to be severely confused.In the ED, patient was evaluated and a CT scan of the head, Chest x-ray all were unremarkable.BMET Hypercalcemia at 14.5, and Hypokalemia at 2.3, along with a elevated BUN/Cr Of 23/2.82. She also had an elevated Troponin level at 0.16.She was referred for admission.Patient was seen by nephrology, calcium has been normalized after aggressive IV fluid hydration, diaphoresis, pamidronate, Miacalcin. Nephrology has signed off. She was also placed on prednisone and she will need to continue prednisone and outpatient follow-up with Dr. Annamaria Boots, her pulmonologist. Patient is being closely followed by psychiatry service, patient started on Seroquel, Ativan, venlafaxine, clorazepate. She will need inpatient psych when bed available. At this point, patient is medically clear.  Assessment & Plan: Principal Problem:   Metabolic encephalopathy Active Problems:   Sarcoidosis ([place])   Essential hypertension   Hypercalcemia   Acute on chronic kidney failure (HCC)   UTI (urinary tract infection)   CKD (chronic kidney disease) stage 4, GFR 15-29 ml/min (HCC)   Hypokalemia   Elevated troponin   Acute kidney injury ([place])   Bipolar I disorder, most recent episode (or current) manic ([place])   Acute Metabolic encephalopathy  - Likely due to profound dehydration, hypercalcemia and uremia. Improving but still has tangential speech - CT head negative, ammonia level 17 - Patient was placed on aggressive IV fluid hydration, IV pamidronate 1 - Calcium has trended down, Now normalized. - MRI brain Negative for any acute CVA - Per psychiatry, patient was started on Adderall XR, venlafaxine XL,  clorazepate, started on Seroquel on 6/1. Psych following. Patient is very tangential and rambling today. - check B1, B12. Empiric thiamine and B12 until resulted  Sarcoidosis ([place]) - Follows Dr. Annamaria Boots, last visit 04/2015, sarcoidosis diagnosed in 2009. Patient was hospitalized in 2014 with similar symptoms, hypercalcemia and renal insufficiency and was placed on prednisone with slow taper at that time - Continue prednisone, will make pulmonology follow-up prior to discharge  Uncontrolled hypertension - BP now better controlled, continue Lasix, atenolol, hydralazine, clonidine  Hypercalcemia: Due to underlying sarcoidosis, not on steroids prior to admission - Patient received aggressive IV fluid hydration, received IV pamidronate 1, continue miacalcin - Started prednisone - PTH appropriately low 13, vitamin D levels 1,25 58.6, PTH RP pending - ACE level high, 90 will need to stay on prednisone  Acute on chronic kidney failure ([place]): Due to profound dehydration and hypercalcemia, has underlying chronic kidney disease, stage IV - Creatinine stable, continue Lasix 40 mg twice a day   Escherichia coli UTI (urinary tract infection) - Urine culture showing Escherichia coli, s/p 8 days of Rocephin, discontinue today   Elevated troponin: Likely due to acute renal insufficiency and hypercalcemia, demand ischemia - No chest pain or shortness of breath currently - 2-D echo showed EF of 60-65% with normal wall motion  Generalized debility - Patient will need inpatient psych at the time of discharge   DVT prophylaxis: Lovenox Code Status: Full Family Communication: called sister several times, no answer, could not leave message due to full mailbox. Will try later. Disposition Plan: inpatient psych  Consultants:   Psychiatry   Nephrology  Procedures:   None  Antimicrobials:  Ceftriaxone 5/26 >> 6/3   Subjective: - confused, tangential speech, confabulating, no significant  improvement since yesterday  Objective: Filed Vitals:   12/17/15 1327 12/17/15 2111 12/18/15 0415 12/18/15 0425  BP: 139/60 136/69 149/75 121/61  Pulse: 90 88 90 82  Temp: 98.1 F (36.7 C) 98.2 F (36.8 C) 98.4 F (36.9 C) 97.7 F (36.5 C)  TempSrc: Oral Oral Oral Oral  Resp: 19 20 16 16   Height:      Weight:      SpO2: 98%  99% 97%    Intake/Output Summary (Last 24 hours) at 12/18/15 1057 Last data filed at 12/17/15 1328  Gross per 24 hour  Intake    460 ml  Output      0 ml  Net    460 ml   Filed Weights   12/09/15 1825  Weight: 106.595 kg (235 lb)    Examination: Constitutional: NAD Filed Vitals:   12/17/15 1327 12/17/15 2111 12/18/15 0415 12/18/15 0425  BP: 139/60 136/69 149/75 121/61  Pulse: 90 88 90 82  Temp: 98.1 F (36.7 C) 98.2 F (36.8 C) 98.4 F (36.9 C) 97.7 F (36.5 C)  TempSrc: Oral Oral Oral Oral  Resp: 19 20 16 16   Height:      Weight:      SpO2: 98%  99% 97%   ENMT: Mucous membranes are moist.  Respiratory: clear to auscultation bilaterally, no wheezing, no crackles. Normal respiratory effort. No accessory muscle use.  Cardiovascular: Regular rate and rhythm, no murmurs / rubs / gallops. No LE edema.    Data Reviewed: I have personally reviewed following labs and imaging studies  CBC:  Recent Labs Lab 12/12/15 0440 12/13/15 0659 12/14/15 0625 12/18/15 0536  WBC 14.2* 9.6 10.1 10.5  HGB 10.7* 9.9* 10.5* 11.5*  HCT 33.8* 31.0* 33.0* 36.1  MCV 82.6 81.2 80.5 81.1  PLT 253 276 338 [id number]   Basic Metabolic Panel:  Recent Labs Lab 12/12/15 0440 12/13/15 0659 12/14/15 0625 12/15/15 0531 12/16/15 0503 12/17/15 0549 12/18/15 0536  NA 139 139 141 137 137 137 137  K 3.4* 3.6 3.2* 3.7 3.7 3.6 3.5  CL 109 108 105 104 105 102 102  CO2 19* 22 26 24 25 24 25   GLUCOSE 92 127* 105* 103* 100* 114* 131*  BUN 22* 30* 32* 33* 37* 43* 43*  CREATININE 2.30* 1.94* 2.14* 2.01* 2.04* 2.01* 2.14*  CALCIUM 10.3 9.9 9.7 9.3 8.8* 9.0 9.0  PHOS  1.5* 2.3* 2.9  --   --   --   --    GFR: Estimated Creatinine Clearance: 33.2 mL/min (by C-G formula based on Cr of 2.14). Liver Function Tests:  Recent Labs Lab 12/12/15 0440 12/13/15 0659 12/14/15 0625  ALBUMIN 3.2* 3.1* 3.3*   No results for input(s): LIPASE, AMYLASE in the last 168 hours. No results for input(s): AMMONIA in the last 168 hours. Coagulation Profile: No results for input(s): INR, PROTIME in the last 168 hours. Cardiac Enzymes: No results for input(s): CKTOTAL, CKMB, CKMBINDEX, TROPONINI in the last 168 hours. BNP (last 3 results) No results for input(s): PROBNP in the last 8760 hours. HbA1C: No results for input(s): HGBA1C in the last 72 hours. CBG: No results for input(s): GLUCAP in the last 168 hours. Lipid Profile: No results for input(s): CHOL, HDL, LDLCALC, TRIG, CHOLHDL, LDLDIRECT in the last 72 hours. Thyroid Function Tests: No results for input(s): TSH, T4TOTAL, FREET4, T3FREE, THYROIDAB in the last 72 hours. Anemia Panel:  Recent  Labs  12/17/15 0948  VITAMINB12 381   Urine analysis:    Component Value Date/Time   COLORURINE YELLOW 12/09/2015 1837   APPEARANCEUR CLOUDY* 12/09/2015 1837   LABSPEC 1.013 12/09/2015 1837   PHURINE 6.5 12/09/2015 1837   GLUCOSEU NEGATIVE 12/09/2015 1837   GLUCOSEU NEGATIVE 09/27/2015 0849   HGBUR SMALL* 12/09/2015 1837   BILIRUBINUR NEGATIVE 12/09/2015 [place] 12/09/2015 1837   PROTEINUR 30* 12/09/2015 1837   UROBILINOGEN 0.2 09/27/2015 0849   NITRITE NEGATIVE 12/09/2015 1837   LEUKOCYTESUR MODERATE* 12/09/2015 1837   Sepsis Labs: Invalid input(s): PROCALCITONIN, LACTICIDVEN  Recent Results (from the past 240 hour(s))  Urine culture     Status: Abnormal   Collection Time: 12/09/15  6:37 PM  Result Value Ref Range Status   Specimen Description URINE, RANDOM  Final   Special Requests NONE  Final   Culture >=100,000 COLONIES/mL ESCHERICHIA COLI (A)  Final   Report Status 12/12/2015  FINAL  Final   Organism ID, Bacteria ESCHERICHIA COLI (A)  Final      Susceptibility   Escherichia coli - MIC*    AMPICILLIN <=2 SENSITIVE Sensitive     CEFAZOLIN <=4 SENSITIVE Sensitive     CEFTRIAXONE <=1 SENSITIVE Sensitive     CIPROFLOXACIN <=0.25 SENSITIVE Sensitive     GENTAMICIN <=1 SENSITIVE Sensitive     IMIPENEM <=0.25 SENSITIVE Sensitive     NITROFURANTOIN <=16 SENSITIVE Sensitive     TRIMETH/SULFA <=20 SENSITIVE Sensitive     AMPICILLIN/SULBACTAM <=2 SENSITIVE Sensitive     PIP/TAZO <=4 SENSITIVE Sensitive     * >=100,000 COLONIES/mL ESCHERICHIA COLI      Radiology Studies: No results found.   Scheduled Meds: . amphetamine-dextroamphetamine  30 mg Oral Daily  . atenolol  50 mg Oral Daily  . calcitonin (salmon)  1 spray Alternating Nares Daily  . cloNIDine  0.1 mg Oral BID  . clorazepate  15 mg Oral QHS  . diltiazem  240 mg Oral Daily  . enoxaparin (LOVENOX) injection  40 mg Subcutaneous Q24H  . furosemide  40 mg Oral BID  . hydrALAZINE  100 mg Oral Q8H  . predniSONE  20 mg Oral Q breakfast  . QUEtiapine  100 mg Oral QHS  . sodium bicarbonate  1,300 mg Oral BID  . thiamine IV  500 mg Intravenous Q24H  . venlafaxine XR  75 mg Oral Q breakfast   Continuous Infusions:    Marzetta Board, MD, PhD [facility] Pager [phone]  If 7PM-7AM, please contact night-coverage [email or web address] Password [email or web address] 12/18/2015, 10:57 AM

## 2015-12-19 LAB — BASIC METABOLIC PANEL
ANION GAP: 9 (ref 5–15)
BUN: 40 mg/dL — ABNORMAL HIGH (ref 6–20)
CO2: 24 mmol/L (ref 22–32)
Calcium: 8.6 mg/dL — ABNORMAL LOW (ref 8.9–10.3)
Chloride: 103 mmol/L (ref 101–111)
Creatinine, Ser: 2.06 mg/dL — ABNORMAL HIGH (ref 0.44–1.00)
GFR calc Af Amer: 28 mL/min — ABNORMAL LOW (ref >60)
GFR, EST NON AFRICAN AMERICAN: 24 mL/min — AB (ref >60)
GLUCOSE: 109 mg/dL — AB (ref 65–99)
POTASSIUM: 3.1 mmol/L — AB (ref 3.5–5.1)
Sodium: 136 mmol/L (ref 135–145)

## 2015-12-19 LAB — VITAMIN B1: VITAMIN B1 (THIAMINE): 149.2 nmol/L (ref 66.5–200.0)

## 2015-12-19 MED ORDER — POTASSIUM CHLORIDE CRYS ER 20 MEQ PO TBCR
30.0000 meq | EXTENDED_RELEASE_TABLET | ORAL | Status: AC
  Administered 2015-12-19 (×2): 30 meq via ORAL
  Filled 2015-12-19 (×2): qty 1

## 2015-12-19 NOTE — Clinical Social Work Psych Note (Addendum)
Psych CSW sent referrals last week to the following facilities: [facility] - 6/5 states admissions are on hold due to critical staffing issues [facility]- 6/5- no "high acuity" bed available [facility]- referral resent [facility]- referral resent [facility]- referral resent [facility]- referral resent [facility]- referral resent [facility]- currently at [place]- referral resent [facility]- currently reviewing referral (resent on 6/6) [facility]- patient admitted to admission waitlist  Psych CSW continues to seek inpatient gero-psych placement for patient as the patient is medically ready to be transferred.  Lubertha Sayres, [facility] [facility]: [phone] Surgical: [phone]

## 2015-12-19 NOTE — Progress Notes (Signed)
Physical Therapy Treatment Patient Details Name: Tara [name] MRN: [id number] DOB: 02/26/1951 Today's Date: 12/19/2015    History of Present Illness Patient is a 65 y/o female with hx of chronic kidney failure, hx sarcoidosis, DM, chronic knee pain/stability presents with confusion. Found to have metabolic encephalopathy- due to Hypercalcemia and Uremia.    PT Comments    Patient continues with tangential speech throughout session, answered yes/no questions appropriately, and followed commands consistently. Decreased initiation of tasks and needs frequent cues to stay task focused. Continue to need min A for ambulation due to unsteadiness.   Follow Up Recommendations  SNF     Equipment Recommendations  None recommended by PT    Recommendations for Other Services OT consult     Precautions / Restrictions Precautions Precautions: Fall Precaution Comments: acute expressive aphagia Restrictions Weight Bearing Restrictions: No    Mobility  Bed Mobility               General bed mobility comments: OOB in chair upon arrival  Transfers Overall transfer level: Needs assistance Equipment used: None   Sit to Stand: Min guard         General transfer comment: min guard for safety due to impulsivity  Ambulation/Gait Ambulation/Gait assistance: Min assist Ambulation Distance (Feet): 100 Feet Assistive device:  (held onto rail in hallway) Gait Pattern/deviations: Step-through pattern;Decreased stride length;Staggering left;Staggering right     General Gait Details: vc for cadence and tactile cues for weight shifting and min A to maintain balance with frequent changes in gait velocity   Stairs            Wheelchair Mobility    Modified Rankin (Stroke Patients Only)       Balance     Sitting balance-Leahy Scale: Good       Standing balance-Leahy Scale: Fair                      Cognition Arousal/Alertness: Awake/alert Behavior During  Therapy: Anxious;Impulsive Overall Cognitive Status: Impaired/Different from baseline Area of Impairment: Orientation;Safety/judgement;Problem solving Orientation Level: Situation;Disoriented to       Safety/Judgement: Decreased awareness of safety   Problem Solving: Requires verbal cues;Decreased initiation General Comments: tangential speech     Exercises      General Comments General comments (skin integrity, edema, etc.): pt able to demo using call button this session       Pertinent Vitals/Pain Pain Assessment: Faces Faces Pain Scale: No hurt Pain Intervention(s): Monitored during session    Home Living                      Prior Function            PT Goals (current goals can now be found in the care plan section) Acute Rehab PT Goals Patient Stated Goal: none stated Progress towards PT goals: Progressing toward goals    Frequency  Min 3X/week    PT Plan Current plan remains appropriate    Co-evaluation             End of Session Equipment Utilized During Treatment: Gait belt Activity Tolerance: Patient tolerated treatment well Patient left: in chair;with call bell/phone within reach;with chair alarm set     Time: 1400-1417 PT Time Calculation (min) (ACUTE ONLY): 17 min  Charges:  $Gait Training: 8-22 mins                    G Codes:  [place] April, PTA Pager: [phone]   12/19/2015, 2:24 PM

## 2015-12-19 NOTE — Progress Notes (Signed)
PROGRESS NOTE  Tara [name] [id number] DOB: 01/16/1951 DOA: 12/09/2015 PCP: Cathlean Cower, MD   LOS: 10 days   Brief Narrative: Tara [name] is a 65 y.o. female with a history of Sarcoidosis, HTN, CKD who did not show up for work today and her colleagues had [employer] go to her home and she was found to be severely confused.In the ED, patient was evaluated and a CT scan of the head, Chest x-ray all were unremarkable.BMET Hypercalcemia at 14.5, and Hypokalemia at 2.3, along with a elevated BUN/Cr Of 23/2.82. She also had an elevated Troponin level at 0.16.She was referred for admission.Patient was seen by nephrology, calcium has been normalized after aggressive IV fluid hydration, diaphoresis, pamidronate, Miacalcin. Nephrology has signed off. She was also placed on prednisone and she will need to continue prednisone and outpatient follow-up with Dr. Annamaria Boots, her pulmonologist. Patient is being closely followed by psychiatry service, patient started on Seroquel, Ativan, venlafaxine, clorazepate. She will need inpatient psych when bed available.   Assessment & Plan: Principal Problem:   Metabolic encephalopathy Active Problems:   Sarcoidosis ([place])   Essential hypertension   Hypercalcemia   Acute on chronic kidney failure (HCC)   UTI (urinary tract infection)   CKD (chronic kidney disease) stage 4, GFR 15-29 ml/min (HCC)   Hypokalemia   Elevated troponin   Acute kidney injury ([place])   Bipolar I disorder, most recent episode (or current) manic ([place])   Acute Metabolic encephalopathy  - Likely due to profound dehydration, hypercalcemia and uremia. Improving but still has tangential speech - CT head negative, ammonia level 17 - Patient was placed on aggressive IV fluid hydration, IV pamidronate 1 - Calcium has trended down, Now normalized. - MRI brain Negative for any acute CVA - Per psychiatry, patient was started on Adderall XR, venlafaxine XL, clorazepate, started on Seroquel on 6/1.  Psych following. Patient is very tangential and rambling today. - check B1, B12. Empiric thiamine for now. - patient medically stable for discharge  Sarcoidosis [facility]) - Follows Dr. Annamaria Boots, last visit 04/2015, sarcoidosis diagnosed in 2009. Patient was hospitalized in 2014 with similar symptoms, hypercalcemia and renal insufficiency and was placed on prednisone with slow taper at that time - Continue prednisone  Uncontrolled hypertension - BP now better controlled, continue Lasix, atenolol, hydralazine, clonidine  Hypercalcemia: Due to underlying sarcoidosis, not on steroids prior to admission - Patient received aggressive IV fluid hydration, received IV pamidronate 1, continue miacalcin - Started prednisone - PTH appropriately low 13, vitamin D levels 1,25 58.6, PTH RP negative - ACE level high, 90 will need to stay on prednisone  Acute on chronic kidney failure ([place]): Due to profound dehydration and hypercalcemia, has underlying chronic kidney disease, stage IV - Creatinine stable, continue Lasix 40 mg twice a day   Escherichia coli UTI (urinary tract infection) - Urine culture showing Escherichia coli, s/p 8 days of Rocephin  Elevated troponin: Likely due to acute renal insufficiency and hypercalcemia, demand ischemia - No chest pain or shortness of breath currently - 2-D echo showed EF of 60-65% with normal wall motion  Generalized debility - Patient will need inpatient psych at the time of discharge.  - Medically stable   DVT prophylaxis: Lovenox Code Status: Full Family Communication: called sister several times, no answer, could not leave message due to full mailbox.  Disposition Plan: inpatient psych  Consultants:   Psychiatry   Nephrology  Procedures:   None   Antimicrobials:  Ceftriaxone 5/26 >> 6/3   Subjective: -  remains confused  Objective: Filed Vitals:   12/19/15 0059 12/19/15 0434 12/19/15 0920 12/19/15 0932  BP: 129/60 110/52 132/65 132/65    Pulse: 73 73 72 73  Temp: 97.7 F (36.5 C) 98 F (36.7 C)  97.7 F (36.5 C)  TempSrc:      Resp: 18 18  17   Height:      Weight:      SpO2: 97% 98%  98%   No intake or output data in the 24 hours ending 12/19/15 1133 Filed Weights   12/09/15 1825  Weight: 106.595 kg (235 lb)    Examination: Constitutional: NAD Filed Vitals:   12/19/15 0059 12/19/15 0434 12/19/15 0920 12/19/15 0932  BP: 129/60 110/52 132/65 132/65  Pulse: 73 73 72 73  Temp: 97.7 F (36.5 C) 98 F (36.7 C)  97.7 F (36.5 C)  TempSrc:      Resp: 18 18  17   Height:      Weight:      SpO2: 97% 98%  98%   ENMT: Mucous membranes are moist.  Respiratory: clear to auscultation bilaterally, no wheezing, no crackles. Normal respiratory effort. No accessory muscle use.  Cardiovascular: Regular rate and rhythm, no murmurs / rubs / gallops. No LE edema.    Data Reviewed: I have personally reviewed following labs and imaging studies  CBC:  Recent Labs Lab 12/13/15 0659 12/14/15 0625 12/18/15 0536  WBC 9.6 10.1 10.5  HGB 9.9* 10.5* 11.5*  HCT 31.0* 33.0* 36.1  MCV 81.2 80.5 81.1  PLT 276 338 [id number]   Basic Metabolic Panel:  Recent Labs Lab 12/13/15 0659 12/14/15 0625 12/15/15 0531 12/16/15 0503 12/17/15 0549 12/18/15 0536 12/19/15 0618  NA 139 141 137 137 137 137 136  K 3.6 3.2* 3.7 3.7 3.6 3.5 3.1*  CL 108 105 104 105 102 102 103  CO2 22 26 24 25 24 25 24   GLUCOSE 127* 105* 103* 100* 114* 131* 109*  BUN 30* 32* 33* 37* 43* 43* 40*  CREATININE 1.94* 2.14* 2.01* 2.04* 2.01* 2.14* 2.06*  CALCIUM 9.9 9.7 9.3 8.8* 9.0 9.0 8.6*  PHOS 2.3* 2.9  --   --   --   --   --    GFR: Estimated Creatinine Clearance: 34.5 mL/min (by C-G formula based on Cr of 2.06). Liver Function Tests:  Recent Labs Lab 12/13/15 0659 12/14/15 0625  ALBUMIN 3.1* 3.3*   No results for input(s): LIPASE, AMYLASE in the last 168 hours. No results for input(s): AMMONIA in the last 168 hours. Coagulation Profile: No  results for input(s): INR, PROTIME in the last 168 hours. Cardiac Enzymes: No results for input(s): CKTOTAL, CKMB, CKMBINDEX, TROPONINI in the last 168 hours. BNP (last 3 results) No results for input(s): PROBNP in the last 8760 hours. HbA1C: No results for input(s): HGBA1C in the last 72 hours. CBG: No results for input(s): GLUCAP in the last 168 hours. Lipid Profile: No results for input(s): CHOL, HDL, LDLCALC, TRIG, CHOLHDL, LDLDIRECT in the last 72 hours. Thyroid Function Tests: No results for input(s): TSH, T4TOTAL, FREET4, T3FREE, THYROIDAB in the last 72 hours. Anemia Panel:  Recent Labs  12/17/15 0948  VITAMINB12 381   Urine analysis:    Component Value Date/Time   COLORURINE YELLOW 12/09/2015 1837   APPEARANCEUR CLOUDY* 12/09/2015 1837   LABSPEC 1.013 12/09/2015 1837   PHURINE 6.5 12/09/2015 1837   GLUCOSEU NEGATIVE 12/09/2015 1837   GLUCOSEU NEGATIVE 09/27/2015 0849   HGBUR SMALL* 12/09/2015 1837   BILIRUBINUR NEGATIVE  12/09/2015 [place] 12/09/2015 1837   PROTEINUR 30* 12/09/2015 1837   UROBILINOGEN 0.2 09/27/2015 0849   NITRITE NEGATIVE 12/09/2015 1837   LEUKOCYTESUR MODERATE* 12/09/2015 1837   Sepsis Labs: Invalid input(s): PROCALCITONIN, LACTICIDVEN  Recent Results (from the past 240 hour(s))  Urine culture     Status: Abnormal   Collection Time: 12/09/15  6:37 PM  Result Value Ref Range Status   Specimen Description URINE, RANDOM  Final   Special Requests NONE  Final   Culture >=100,000 COLONIES/mL ESCHERICHIA COLI (A)  Final   Report Status 12/12/2015 FINAL  Final   Organism ID, Bacteria ESCHERICHIA COLI (A)  Final      Susceptibility   Escherichia coli - MIC*    AMPICILLIN <=2 SENSITIVE Sensitive     CEFAZOLIN <=4 SENSITIVE Sensitive     CEFTRIAXONE <=1 SENSITIVE Sensitive     CIPROFLOXACIN <=0.25 SENSITIVE Sensitive     GENTAMICIN <=1 SENSITIVE Sensitive     IMIPENEM <=0.25 SENSITIVE Sensitive     NITROFURANTOIN <=16  SENSITIVE Sensitive     TRIMETH/SULFA <=20 SENSITIVE Sensitive     AMPICILLIN/SULBACTAM <=2 SENSITIVE Sensitive     PIP/TAZO <=4 SENSITIVE Sensitive     * >=100,000 COLONIES/mL ESCHERICHIA COLI      Radiology Studies: No results found.   Scheduled Meds: . amphetamine-dextroamphetamine  30 mg Oral Daily  . atenolol  50 mg Oral Daily  . calcitonin (salmon)  1 spray Alternating Nares Daily  . cloNIDine  0.1 mg Oral BID  . clorazepate  15 mg Oral QHS  . diltiazem  240 mg Oral Daily  . enoxaparin (LOVENOX) injection  40 mg Subcutaneous Q24H  . furosemide  40 mg Oral BID  . hydrALAZINE  100 mg Oral Q8H  . potassium chloride  30 mEq Oral Q3H  . predniSONE  20 mg Oral Q breakfast  . QUEtiapine  100 mg Oral QHS  . sodium bicarbonate  1,300 mg Oral BID  . thiamine IV  500 mg Intravenous Q24H  . venlafaxine XR  75 mg Oral Q breakfast   Continuous Infusions:    Marzetta Board, MD, PhD [facility] Pager [phone]  If 7PM-7AM, please contact night-coverage [email or web address] Password [facility] 12/19/2015, 11:33 AM

## 2015-12-20 ENCOUNTER — Ambulatory Visit: Payer: BLUE CROSS/BLUE SHIELD | Admitting: Family Medicine

## 2015-12-20 DIAGNOSIS — F330 Major depressive disorder, recurrent, mild: Secondary | ICD-10-CM | POA: Diagnosis present
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
## 2015-12-20 NOTE — Progress Notes (Addendum)
PROGRESS NOTE  Tara [name] [id number] DOB: Nov 20, 1950 DOA: 12/09/2015 PCP: Cathlean Cower, MD   LOS: 11 days   Brief Narrative: Tara [name] is a 65 y.o. female with a history of Sarcoidosis, HTN, CKD who did not show up for work today and her colleagues had [employer] go to her home and she was found to be severely confused.In the ED, patient was evaluated and a CT scan of the head, Chest x-ray all were unremarkable.BMET Hypercalcemia at 14.5, and Hypokalemia at 2.3, along with a elevated BUN/Cr Of 23/2.82. She also had an elevated Troponin level at 0.16.She was referred for admission.Patient was seen by nephrology, calcium has been normalized after aggressive IV fluid hydration, diaphoresis, pamidronate, Miacalcin. Nephrology has signed off. She was also placed on prednisone and she will need to continue prednisone and outpatient follow-up with Dr. Annamaria Boots, her pulmonologist. Patient is being closely followed by psychiatry service, patient started on Seroquel, Ativan, venlafaxine, clorazepate. She will need inpatient psych when bed available.   Assessment & Plan: Principal Problem:   Metabolic encephalopathy Active Problems:   Sarcoidosis ([place])   Essential hypertension   Hypercalcemia   Acute on chronic kidney failure (HCC)   UTI (urinary tract infection)   CKD (chronic kidney disease) stage 4, GFR 15-29 ml/min (HCC)   Hypokalemia   Elevated troponin   Acute kidney injury ([place])   Major depressive disorder, recurrent episode, mild (HCC)   Acute Metabolic encephalopathy  - Likely due to profound dehydration, hypercalcemia and uremia. Improving but still has tangential speech - CT head negative, ammonia level 17 - Patient was placed on aggressive IV fluid hydration, IV pamidronate 1 - Calcium has trended down, Now normalized. - MRI brain Negative for any acute CVA - Per psychiatry, patient was started on Adderall XR, venlafaxine XL, clorazepate, started on Seroquel on 6/1. Psych  following. Patient is very tangential and rambling today. - check B1, B12. Empiric thiamine for now. - patient medically stable for discharge  Sarcoidosis [facility]) - Follows Dr. Annamaria Boots, last visit 04/2015, sarcoidosis diagnosed in 2009. Patient was hospitalized in 2014 with similar symptoms, hypercalcemia and renal insufficiency and was placed on prednisone with slow taper at that time - Continue prednisone  Uncontrolled hypertension - BP now better controlled, continue Lasix, atenolol, hydralazine, clonidine  Hypercalcemia: Due to underlying sarcoidosis, not on steroids prior to admission - Patient received aggressive IV fluid hydration, received IV pamidronate 1, continue miacalcin - Started prednisone - PTH appropriately low 13, vitamin D levels 1,25 58.6, PTH RP negative - ACE level high, 90 will need to stay on prednisone  Acute on chronic kidney failure ([place]): Due to profound dehydration and hypercalcemia, has underlying chronic kidney disease, stage IV - Creatinine stable, continue Lasix 40 mg twice a day   Escherichia coli UTI (urinary tract infection) - Urine culture showing Escherichia coli, s/p 8 days of Rocephin  Elevated troponin: Likely due to acute renal insufficiency and hypercalcemia, demand ischemia - No chest pain or shortness of breath currently - 2-D echo showed EF of 60-65% with normal wall motion  Generalized debility - Patient will need inpatient psych at the time of discharge.  - Medically stable   DVT prophylaxis: Lovenox Code Status: Full Family Communication: called sister several times, no answer, could not leave message due to full mailbox.  Disposition Plan: inpatient psych  Consultants:   Psychiatry   Nephrology  Procedures:   None   Antimicrobials:  Ceftriaxone 5/26 >> 6/3   Subjective: - remains confused. No  new complaints  Objective: Filed Vitals:   12/19/15 2058 12/20/15 0401 12/20/15 0837 12/20/15 1053  BP: 158/72 124/69  111/55 140/68  Pulse: 34 70 64 66  Temp: 98.1 F (36.7 C) 97.9 F (36.6 C) 98.6 F (37 C)   TempSrc: Oral Oral    Resp: 18 17 18 20   Height:      Weight:      SpO2: 95% 100% 98% 100%    Intake/Output Summary (Last 24 hours) at 12/20/15 1208 Last data filed at 12/20/15 1139  Gross per 24 hour  Intake    240 ml  Output      0 ml  Net    240 ml   Filed Weights   12/09/15 1825  Weight: 106.595 kg (235 lb)    Examination: Constitutional: NAD Filed Vitals:   12/19/15 2058 12/20/15 0401 12/20/15 0837 12/20/15 1053  BP: 158/72 124/69 111/55 140/68  Pulse: 34 70 64 66  Temp: 98.1 F (36.7 C) 97.9 F (36.6 C) 98.6 F (37 C)   TempSrc: Oral Oral    Resp: 18 17 18 20   Height:      Weight:      SpO2: 95% 100% 98% 100%   ENMT: Mucous membranes are moist.  Respiratory: clear to auscultation bilaterally, no wheezing, no crackles. Normal respiratory effort. No accessory muscle use.  Cardiovascular: Regular rate and rhythm, no murmurs / rubs / gallops. No LE edema.    Data Reviewed: I have personally reviewed following labs and imaging studies  CBC:  Recent Labs Lab 12/14/15 0625 12/18/15 0536  WBC 10.1 10.5  HGB 10.5* 11.5*  HCT 33.0* 36.1  MCV 80.5 81.1  PLT 338 [id number]   Basic Metabolic Panel:  Recent Labs Lab 12/14/15 0625 12/15/15 0531 12/16/15 0503 12/17/15 0549 12/18/15 0536 12/19/15 0618  NA 141 137 137 137 137 136  K 3.2* 3.7 3.7 3.6 3.5 3.1*  CL 105 104 105 102 102 103  CO2 26 24 25 24 25 24   GLUCOSE 105* 103* 100* 114* 131* 109*  BUN 32* 33* 37* 43* 43* 40*  CREATININE 2.14* 2.01* 2.04* 2.01* 2.14* 2.06*  CALCIUM 9.7 9.3 8.8* 9.0 9.0 8.6*  PHOS 2.9  --   --   --   --   --    GFR: Estimated Creatinine Clearance: 34.5 mL/min (by C-G formula based on Cr of 2.06). Liver Function Tests:  Recent Labs Lab 12/14/15 0625  ALBUMIN 3.3*   No results for input(s): LIPASE, AMYLASE in the last 168 hours. No results for input(s): AMMONIA in the last  168 hours. Coagulation Profile: No results for input(s): INR, PROTIME in the last 168 hours. Cardiac Enzymes: No results for input(s): CKTOTAL, CKMB, CKMBINDEX, TROPONINI in the last 168 hours. BNP (last 3 results) No results for input(s): PROBNP in the last 8760 hours. HbA1C: No results for input(s): HGBA1C in the last 72 hours. CBG: No results for input(s): GLUCAP in the last 168 hours. Lipid Profile: No results for input(s): CHOL, HDL, LDLCALC, TRIG, CHOLHDL, LDLDIRECT in the last 72 hours. Thyroid Function Tests: No results for input(s): TSH, T4TOTAL, FREET4, T3FREE, THYROIDAB in the last 72 hours. Anemia Panel: No results for input(s): VITAMINB12, FOLATE, FERRITIN, TIBC, IRON, RETICCTPCT in the last 72 hours. Urine analysis:    Component Value Date/Time   COLORURINE YELLOW 12/09/2015 1837   APPEARANCEUR CLOUDY* 12/09/2015 1837   LABSPEC 1.013 12/09/2015 1837   PHURINE 6.5 12/09/2015 1837   GLUCOSEU NEGATIVE 12/09/2015 1837  GLUCOSEU NEGATIVE 09/27/2015 0849   HGBUR SMALL* 12/09/2015 1837   BILIRUBINUR NEGATIVE 12/09/2015 [place] 12/09/2015 1837   PROTEINUR 30* 12/09/2015 1837   UROBILINOGEN 0.2 09/27/2015 0849   NITRITE NEGATIVE 12/09/2015 1837   LEUKOCYTESUR MODERATE* 12/09/2015 1837   Sepsis Labs: Invalid input(s): PROCALCITONIN, LACTICIDVEN  No results found for this or any previous visit (from the past 240 hour(s)).    Radiology Studies: No results found.   Scheduled Meds: . amphetamine-dextroamphetamine  30 mg Oral Daily  . atenolol  50 mg Oral Daily  . calcitonin (salmon)  1 spray Alternating Nares Daily  . cloNIDine  0.1 mg Oral BID  . clorazepate  15 mg Oral QHS  . diltiazem  240 mg Oral Daily  . enoxaparin (LOVENOX) injection  40 mg Subcutaneous Q24H  . furosemide  40 mg Oral BID  . hydrALAZINE  100 mg Oral Q8H  . predniSONE  20 mg Oral Q breakfast  . QUEtiapine  100 mg Oral QHS  . sodium bicarbonate  1,300 mg Oral BID  .  venlafaxine XR  75 mg Oral Q breakfast   Continuous Infusions:    Marzetta Board, MD, PhD [facility] Pager [phone]  If 7PM-7AM, please contact night-coverage [email or web address] Password [email or web address] 12/20/2015, 12:08 PM

## 2015-12-21 ENCOUNTER — Inpatient Hospital Stay (HOSPITAL_COMMUNITY): Payer: BLUE CROSS/BLUE SHIELD

## 2015-12-21 ENCOUNTER — Ambulatory Visit: Payer: BLUE CROSS/BLUE SHIELD | Admitting: Family Medicine

## 2015-12-21 ENCOUNTER — Telehealth: Payer: Self-pay | Admitting: Internal Medicine

## 2015-12-21 DIAGNOSIS — N179 Acute kidney failure, unspecified: Secondary | ICD-10-CM

## 2015-12-21 DIAGNOSIS — N189 Chronic kidney disease, unspecified: Secondary | ICD-10-CM

## 2015-12-21 DIAGNOSIS — Z0289 Encounter for other administrative examinations: Secondary | ICD-10-CM

## 2015-12-21 DIAGNOSIS — I10 Essential (primary) hypertension: Secondary | ICD-10-CM

## 2015-12-21 LAB — BASIC METABOLIC PANEL
ANION GAP: 9 (ref 5–15)
BUN: 35 mg/dL — AB (ref 6–20)
CALCIUM: 8.2 mg/dL — AB (ref 8.9–10.3)
CO2: 23 mmol/L (ref 22–32)
CREATININE: 1.73 mg/dL — AB (ref 0.44–1.00)
Chloride: 105 mmol/L (ref 101–111)
GFR calc Af Amer: 35 mL/min — ABNORMAL LOW (ref >60)
GFR calc non Af Amer: 30 mL/min — ABNORMAL LOW (ref >60)
GLUCOSE: 117 mg/dL — AB (ref 65–99)
Potassium: 3.7 mmol/L (ref 3.5–5.1)
Sodium: 137 mmol/L (ref 135–145)

## 2015-12-21 LAB — CBC
HEMATOCRIT: 35.6 % — AB (ref 36.0–46.0)
Hemoglobin: 11.1 g/dL — ABNORMAL LOW (ref 12.0–15.0)
MCH: 25.8 pg — AB (ref 26.0–34.0)
MCHC: 31.2 g/dL (ref 30.0–36.0)
MCV: 82.6 fL (ref 78.0–100.0)
Platelets: 289 10*3/uL (ref 150–400)
RBC: 4.31 MIL/uL (ref 3.87–5.11)
RDW: 15.9 % — AB (ref 11.5–15.5)
WBC: 10.7 10*3/uL — ABNORMAL HIGH (ref 4.0–10.5)

## 2015-12-21 LAB — COPPER, SERUM: Copper: 158 ug/dL (ref 72–166)

## 2015-12-21 NOTE — Progress Notes (Signed)
PROGRESS NOTE  Tara [name] [id number] DOB: 12/24/1950 DOA: 12/09/2015 PCP: Cathlean Cower, MD   LOS: 12 days   Brief Narrative: Tara [name] is a 65 y.o. female with a history of Sarcoidosis, HTN, CKD who did not show up for work today and her colleagues had [employer] go to her home and she was found to be severely confused.In the ED, patient was evaluated and a CT scan of the head, Chest x-ray all were unremarkable.BMET Hypercalcemia at 14.5, and Hypokalemia at 2.3, along with a elevated BUN/Cr Of 23/2.82. She also had an elevated Troponin level at 0.16.She was referred for admission.Patient was seen by nephrology, calcium has been normalized after aggressive IV fluid hydration, diaphoresis, pamidronate, Miacalcin. Nephrology has signed off. She was also placed on prednisone and she will need to continue prednisone and outpatient follow-up with Dr. Annamaria Boots, her pulmonologist. Patient is being closely followed by psychiatry service, patient started on Seroquel, Ativan, venlafaxine, clorazepate. She will need inpatient psych and SNF when bed available.   Assessment & Plan: Principal Problem:   Metabolic encephalopathy Active Problems:   Sarcoidosis ([place])   Essential hypertension   Hypercalcemia   Acute on chronic kidney failure (HCC)   UTI (urinary tract infection)   CKD (chronic kidney disease) stage 4, GFR 15-29 ml/min (HCC)   Hypokalemia   Elevated troponin   Acute kidney injury ([place])   Major depressive disorder, recurrent episode, mild (HCC)   Acute Metabolic encephalopathy  - Likely due to profound dehydration, hypercalcemia and uremia. Improving but still has tangential speech. - CT head negative, ammonia level 17 - Patient was placed on aggressive IV fluid hydration, IV pamidronate 1 - Calcium has trended down, Now normalized. - MRI brain Negative for any acute CVA - Per psychiatry, patient was started on Adderall XR, venlafaxine XL, clorazepate, started on Seroquel on 6/1.  Psych following. Patient is very tangential TODAY. - normal B1, B12. Empiric thiamine for now. - patient medically stable for discharge  Sarcoidosis [facility]) - Follows Dr. Annamaria Boots, last visit 04/2015, sarcoidosis diagnosed in 2009. Patient was hospitalized in 2014 with similar symptoms, hypercalcemia and renal insufficiency and was placed on prednisone with slow taper at that time - Continue prednisone  Uncontrolled hypertension - BP now better controlled, continue Lasix, atenolol, hydralazine, clonidine  Hypercalcemia: Due to underlying sarcoidosis, not on steroids prior to admission - Patient received aggressive IV fluid hydration, received IV pamidronate 1, continue miacalcin - Started prednisone - PTH appropriately low 13, vitamin D levels 1,25 58.6, PTH RP negative - ACE level high, 90 will need to stay on prednisone  Acute on chronic kidney failure ([place]): Due to profound dehydration and hypercalcemia, has underlying chronic kidney disease, stage IV - Creatinine stable, continue Lasix 40 mg twice a day   Escherichia coli UTI (urinary tract infection) - Urine culture showing Escherichia coli, s/p 8 days of Rocephin. Completed the treatement.  Elevated troponin: Likely due to acute renal insufficiency and hypercalcemia, demand ischemia - No chest pain or shortness of breath currently - 2-D echo showed EF of 60-65% with normal wall motion  Generalized debility - Patient will need inpatient psych at the time of discharge.  - Medically stable   DVT prophylaxis: Lovenox Code Status: Full Family Communication: called sister several times, no answer, could not leave message due to full mailbox.  Disposition Plan: inpatient psych/ SNF  Consultants:   Psychiatry   Nephrology  Procedures:   None   Antimicrobials:  Ceftriaxone 5/26 >> 6/3   Subjective: -  remains confused. No new complaints  Objective: Filed Vitals:   12/20/15 1520 12/20/15 2129 12/21/15 0522 12/21/15  1411  BP: 103/58 166/75 133/68 119/51  Pulse:  75 75 67  Temp:  98.3 F (36.8 C) 98.3 F (36.8 C) 97.7 F (36.5 C)  TempSrc:  Oral Oral Oral  Resp:  20 20 18   Height:      Weight:      SpO2:  99% 98% 99%    Intake/Output Summary (Last 24 hours) at 12/21/15 1726 Last data filed at 12/21/15 1335  Gross per 24 hour  Intake    240 ml  Output      0 ml  Net    240 ml   Filed Weights   12/09/15 1825  Weight: 106.595 kg (235 lb)    Examination: Constitutional: NAD Filed Vitals:   12/20/15 1520 12/20/15 2129 12/21/15 0522 12/21/15 1411  BP: 103/58 166/75 133/68 119/51  Pulse:  75 75 67  Temp:  98.3 F (36.8 C) 98.3 F (36.8 C) 97.7 F (36.5 C)  TempSrc:  Oral Oral Oral  Resp:  20 20 18   Height:      Weight:      SpO2:  99% 98% 99%   ENMT: Mucous membranes are moist.  Respiratory: clear to auscultation bilaterally, no wheezing, no crackles. Normal respiratory effort. No accessory muscle use.  Cardiovascular: Regular rate and rhythm, no murmurs / rubs / gallops. No LE edema.    Data Reviewed: I have personally reviewed following labs and imaging studies  CBC:  Recent Labs Lab 12/18/15 0536 12/21/15 0507  WBC 10.5 10.7*  HGB 11.5* 11.1*  HCT 36.1 35.6*  MCV 81.1 82.6  PLT 321 [id number]   Basic Metabolic Panel:  Recent Labs Lab 12/16/15 0503 12/17/15 0549 12/18/15 0536 12/19/15 0618 12/21/15 0507  NA 137 137 137 136 137  K 3.7 3.6 3.5 3.1* 3.7  CL 105 102 102 103 105  CO2 25 24 25 24 23   GLUCOSE 100* 114* 131* 109* 117*  BUN 37* 43* 43* 40* 35*  CREATININE 2.04* 2.01* 2.14* 2.06* 1.73*  CALCIUM 8.8* 9.0 9.0 8.6* 8.2*   GFR: Estimated Creatinine Clearance: 41.1 mL/min (by C-G formula based on Cr of 1.73). Liver Function Tests: No results for input(s): AST, ALT, ALKPHOS, BILITOT, PROT, ALBUMIN in the last 168 hours. No results for input(s): LIPASE, AMYLASE in the last 168 hours. No results for input(s): AMMONIA in the last 168 hours. Coagulation  Profile: No results for input(s): INR, PROTIME in the last 168 hours. Cardiac Enzymes: No results for input(s): CKTOTAL, CKMB, CKMBINDEX, TROPONINI in the last 168 hours. BNP (last 3 results) No results for input(s): PROBNP in the last 8760 hours. HbA1C: No results for input(s): HGBA1C in the last 72 hours. CBG: No results for input(s): GLUCAP in the last 168 hours. Lipid Profile: No results for input(s): CHOL, HDL, LDLCALC, TRIG, CHOLHDL, LDLDIRECT in the last 72 hours. Thyroid Function Tests: No results for input(s): TSH, T4TOTAL, FREET4, T3FREE, THYROIDAB in the last 72 hours. Anemia Panel: No results for input(s): VITAMINB12, FOLATE, FERRITIN, TIBC, IRON, RETICCTPCT in the last 72 hours. Urine analysis:    Component Value Date/Time   COLORURINE YELLOW 12/09/2015 1837   APPEARANCEUR CLOUDY* 12/09/2015 1837   LABSPEC 1.013 12/09/2015 1837   PHURINE 6.5 12/09/2015 1837   GLUCOSEU NEGATIVE 12/09/2015 1837   GLUCOSEU NEGATIVE 09/27/2015 0849   HGBUR SMALL* 12/09/2015 1837   BILIRUBINUR NEGATIVE 12/09/2015 [place]  12/09/2015 1837   PROTEINUR 30* 12/09/2015 1837   UROBILINOGEN 0.2 09/27/2015 0849   NITRITE NEGATIVE 12/09/2015 1837   LEUKOCYTESUR MODERATE* 12/09/2015 1837   Sepsis Labs: Invalid input(s): PROCALCITONIN, LACTICIDVEN  No results found for this or any previous visit (from the past 240 hour(s)).    Radiology Studies: Dg Chest 2 View  12/21/2015  CLINICAL DATA:  Cough, confusion EXAM: CHEST  2 VIEW COMPARISON:  12/09/2015 FINDINGS: Cardiomediastinal silhouette is unremarkable. No acute infiltrate or pleural effusion. No pulmonary edema. Mild degenerative changes mid thoracic spine. IMPRESSION: No active cardiopulmonary disease. Electronically Signed   By: Lahoma Crocker M.D.   On: 12/21/2015 14:05     Scheduled Meds: . amphetamine-dextroamphetamine  30 mg Oral Daily  . atenolol  50 mg Oral Daily  . calcitonin (salmon)  1 spray Alternating Nares  Daily  . cloNIDine  0.1 mg Oral BID  . clorazepate  15 mg Oral QHS  . diltiazem  240 mg Oral Daily  . enoxaparin (LOVENOX) injection  40 mg Subcutaneous Q24H  . furosemide  40 mg Oral BID  . hydrALAZINE  100 mg Oral Q8H  . predniSONE  20 mg Oral Q breakfast  . QUEtiapine  100 mg Oral QHS  . sodium bicarbonate  1,300 mg Oral BID  . venlafaxine XR  75 mg Oral Q breakfast   Continuous Infusions:    Hosie Poisson, MD,  [facility] Pager [phone]   If 7PM-7AM, please contact night-coverage [email or web address] Password [facility] 12/21/2015, 5:26 PM

## 2015-12-21 NOTE — Telephone Encounter (Signed)
[name], sister, called request to speak to the assistant concern Ms. Markson. [name] stated that the pt is in the hospital for 2 weeks now and she is not getting any better, pt is confuse, unaware of what kind of treatment she is getting or who her doctor in the hospital is. Pt need some advise to help her sister out, no other family member is with her in the hospital at the moment.

## 2015-12-21 NOTE — Progress Notes (Signed)
Physical Therapy Treatment Patient Details Name: Tara [name] MRN: [id number] DOB: 12/08/1950 Today's Date: 12/21/2015    History of Present Illness Patient is a 65 y/o female with hx of chronic kidney failure, hx sarcoidosis, DM, chronic knee pain/stability presents with confusion. Found to have metabolic encephalopathy- due to Hypercalcemia and Uremia.    PT Comments    Patient with greatly improved communication and appropriately answered questions 75% time. Oriented X3 with inability to state why she is in the hospital. No tangential speech this session. Pt appeared to be more anxious in distracting environment. Continues to require assistance for problem solving. Current plan remains appropriate.   Follow Up Recommendations  SNF     Equipment Recommendations  None recommended by PT    Recommendations for Other Services OT consult     Precautions / Restrictions Precautions Precautions: Fall Precaution Comments: acute expressive aphagia Restrictions Weight Bearing Restrictions: No    Mobility  Bed Mobility               General bed mobility comments: OOB in chair upon arrival  Transfers Overall transfer level: Needs assistance Equipment used: None Transfers: Sit to/from Stand Sit to Stand: Supervision         General transfer comment: supervision for safety  Ambulation/Gait Ambulation/Gait assistance: Min guard Ambulation Distance (Feet): 200 Feet Assistive device: None Gait Pattern/deviations: Step-through pattern;Decreased stride length;Drifts right/left   Gait velocity interpretation: at or above normal speed for age/gender General Gait Details: cues for posture and cadence; pt drifts R and L but no LOB; directional cues needed; pt felt slightly SOB but VSS   Stairs            Wheelchair Mobility    Modified Rankin (Stroke Patients Only)       Balance     Sitting balance-Leahy Scale: Good       Standing balance-Leahy Scale:  Fair                      Cognition Arousal/Alertness: Awake/alert Behavior During Therapy: Anxious Overall Cognitive Status: Impaired/Different from baseline Area of Impairment: Safety/judgement;Problem solving;Orientation Orientation Level: Situation       Safety/Judgement: Decreased awareness of safety   Problem Solving: Requires verbal cues General Comments: pt oriented X3 and unable to state why she is in the hospital; without tangential speech and answered questions appropriately 75 % of the time which is much different than in previous sessions    Exercises General Exercises - Lower Extremity Long Arc Quad: Strengthening;Both;10 reps;Seated Hip ABduction/ADduction: Strengthening;Both;10 reps;Seated Hip Flexion/Marching: Strengthening;Both;10 reps;Standing Toe Raises: Strengthening;Both;10 reps;Seated Heel Raises: Strengthening;Both;10 reps;Seated Mini-Sqauts: Strengthening;10 reps;Standing    General Comments        Pertinent Vitals/Pain Pain Assessment: No/denies pain    Home Living                      Prior Function            PT Goals (current goals can now be found in the care plan section) Acute Rehab PT Goals Patient Stated Goal: none stated Progress towards PT goals: Progressing toward goals    Frequency  Min 3X/week    PT Plan Current plan remains appropriate    Co-evaluation             End of Session Equipment Utilized During Treatment: Gait belt Activity Tolerance: Patient tolerated treatment well Patient left: in chair;with call bell/phone within reach;with chair alarm set  Time: [id number] PT Time Calculation (min) (ACUTE ONLY): 29 min  Charges:  $Gait Training: 8-22 mins $Therapeutic Exercise: 8-22 mins                    G Codes:      [place] April, PTA Pager: [phone]   12/21/2015, 5:06 PM

## 2015-12-21 NOTE — Clinical Social Work Psych Note (Signed)
Psych CSW spoke with admission's coordinator at [facility] who requests patient have: EKG, chest x-ray, and UA within the last 72 hours.  Upon chart review the requested labs/images were completed in late May.  MD paged to have updated labs/images.    Nonnie Done, LCSW [phone]  [address] [place] Licensed Clinical Social Worker

## 2015-12-21 NOTE — Telephone Encounter (Signed)
i tried to call patient, got ring tone but never went to voicemail---if sister calls back, she needs to talk with tamara

## 2015-12-22 DIAGNOSIS — G9341 Metabolic encephalopathy: Secondary | ICD-10-CM

## 2015-12-22 LAB — BASIC METABOLIC PANEL
Anion gap: 10 (ref 5–15)
BUN: 32 mg/dL — AB (ref 6–20)
CHLORIDE: 101 mmol/L (ref 101–111)
CO2: 23 mmol/L (ref 22–32)
CREATININE: 1.7 mg/dL — AB (ref 0.44–1.00)
Calcium: 8.6 mg/dL — ABNORMAL LOW (ref 8.9–10.3)
GFR calc Af Amer: 36 mL/min — ABNORMAL LOW (ref >60)
GFR calc non Af Amer: 31 mL/min — ABNORMAL LOW (ref >60)
Glucose, Bld: 273 mg/dL — ABNORMAL HIGH (ref 65–99)
POTASSIUM: 4.3 mmol/L (ref 3.5–5.1)
Sodium: 134 mmol/L — ABNORMAL LOW (ref 135–145)

## 2015-12-22 LAB — URINALYSIS, ROUTINE W REFLEX MICROSCOPIC
BILIRUBIN URINE: NEGATIVE
Glucose, UA: 100 mg/dL — AB
HGB URINE DIPSTICK: NEGATIVE
Ketones, ur: NEGATIVE mg/dL
Leukocytes, UA: NEGATIVE
Nitrite: NEGATIVE
PH: 6.5 (ref 5.0–8.0)
Protein, ur: NEGATIVE mg/dL
SPECIFIC GRAVITY, URINE: 1.008 (ref 1.005–1.030)

## 2015-12-22 MED ORDER — FUROSEMIDE 40 MG PO TABS: 40.0000 mg | ORAL_TABLET | Freq: Two times a day (BID) | ORAL | Status: DC

## 2015-12-22 MED ORDER — AMPHETAMINE-DEXTROAMPHET ER 30 MG PO CP24: 30.0000 mg | ORAL_CAPSULE | Freq: Every day | ORAL | Status: DC

## 2015-12-22 MED ORDER — CLONIDINE HCL 0.1 MG PO TABS: 0.1000 mg | ORAL_TABLET | Freq: Two times a day (BID) | ORAL | Status: DC

## 2015-12-22 MED ORDER — VENLAFAXINE HCL ER 75 MG PO CP24: 75.0000 mg | ORAL_CAPSULE | Freq: Every day | ORAL | Status: DC

## 2015-12-22 MED ORDER — PREDNISONE 20 MG PO TABS: ORAL_TABLET | ORAL | Status: DC

## 2015-12-22 MED ORDER — CLORAZEPATE DIPOTASSIUM 15 MG PO TABS: 15.0000 mg | ORAL_TABLET | Freq: Every day | ORAL | Status: DC

## 2015-12-22 MED ORDER — QUETIAPINE FUMARATE ER 50 MG PO TB24: 100.0000 mg | ORAL_TABLET | Freq: Every day | ORAL | Status: DC

## 2015-12-22 MED ORDER — PREDNISONE 20 MG PO TABS: 20.0000 mg | ORAL_TABLET | Freq: Every day | ORAL | Status: DC

## 2015-12-22 MED ORDER — HYDRALAZINE HCL 100 MG PO TABS: 100.0000 mg | ORAL_TABLET | Freq: Three times a day (TID) | ORAL | Status: DC

## 2015-12-22 NOTE — Discharge Summary (Addendum)
Physician Discharge Summary  Tara [name] [id number] DOB: 1951/07/07 DOA: 12/09/2015  PCP: Cathlean Cower, MD  Admit date: 12/09/2015 Discharge date: 12/22/2015  Time spent: 25 minutes  Recommendations for Outpatient Follow-up:  1. Follow up with PCP in 1 to 2 weeks.  2. Please check your renal function at your pcp office.  3. We have started you on steroids, with a slow taper, please follow up with your PCP regarding that.  4. followup with your nephrologist Dr Elmarie Shiley in 2 weeks. Please check renal function and calcium level.    Discharge Diagnoses:  Principal Problem:   Metabolic encephalopathy Active Problems:   Sarcoidosis ([place])   Essential hypertension   Hypercalcemia   Acute on chronic kidney failure (HCC)   UTI (urinary tract infection)   CKD (chronic kidney disease) stage 4, GFR 15-29 ml/min (HCC)   Hypokalemia   Elevated troponin   Acute kidney injury ([place])   Major depressive disorder, recurrent episode, mild ([place])   Discharge Condition: improved  Diet recommendation: low sodium diet.   Filed Weights   12/09/15 1825  Weight: 106.595 kg (235 lb)    History of present illness:  Tara [name] is a 65 y.o. female with a history of Sarcoidosis, HTN, CKD who did not show up for work today and her colleagues had [employer] go to her home and she was found to be severely confused.In the ED, patient was evaluated and a CT scan of the head, Chest x-ray all were unremarkable.BMET Hypercalcemia at 14.5, and Hypokalemia at 2.3, along with a elevated BUN/Cr Of 23/2.82. She also had an elevated Troponin level at 0.16.She was referred for admission.Patient was seen by nephrology, calcium has been normalized after aggressive IV fluid hydration, diaphoresis, pamidronate, Miacalcin. Nephrology has signed off. She was also placed on prednisone and she will need to continue prednisone and outpatient follow-up with Dr. Annamaria Boots, her pulmonologist. Patient is being closely followed by  psychiatry service, patient started on Seroquel, Ativan, venlafaxine, clorazepate. She will need inpatient psych when bed available.   Hospital Course:  Acute Metabolic encephalopathy  - Likely due to profound dehydration, hypercalcemia and uremia. Improving but still has tangential speech - CT head negative, ammonia level 17 - Patient was placed on aggressive IV fluid hydration, IV pamidronate 1 - Calcium has trended down, Now normalized. - MRI brain Negative for any acute CVA - Per psychiatry, patient was started on Adderall XR, venlafaxine XL, clorazepate, started on Seroquel on 6/1. Psych following.  - patient's B1, B12 levels normal.  - patient medically stable for discharge  Sarcoidosis [facility]) - Follows Dr. Annamaria Boots, last visit 04/2015, sarcoidosis diagnosed in 2009. Patient was hospitalized in 2014 with similar symptoms, hypercalcemia and renal insufficiency and was placed on prednisone with slow taper at this time - Continue prednisone  Uncontrolled hypertension - BP now better controlled, continue Lasix, atenolol, hydralazine, clonidine  Hypercalcemia: Due to underlying sarcoidosis, not on steroids prior to admission - Patient received aggressive IV fluid hydration, received IV pamidronate 1, continue miacalcin - Started prednisone - PTH appropriately low 13, vitamin D levels 1,25 58.6, PTH RP negative - ACE level high, 90 will need to stay on prednisone  Acute on chronic kidney failure ([place]): Due to profound dehydration and hypercalcemia, has underlying chronic kidney disease, stage IV - Creatinine stable, continue Lasix 40 mg twice a day   Escherichia coli UTI (urinary tract infection) - Urine culture showing Escherichia coli, s/p 8 days of Rocephin  Elevated troponin: Likely due to acute  renal insufficiency and hypercalcemia, demand ischemia.  - No chest pain or shortness of breath currently - 2-D echo showed EF of 60-65% with normal wall motion  Generalized  debility - Patient will need inpatient psych at the time of discharge.  - Medically stable   Procedures:  None  Consultations:  Psychiatry.  Nephrology  Discharge Exam: Filed Vitals:   12/21/15 1411 12/22/15 0612  BP: 119/51 143/69  Pulse: 67 72  Temp: 97.7 F (36.5 C) 98 F (36.7 C)  Resp: 18 19    General: alert comfortable. confused Cardiovascular: s1s2 Respiratory: ctab  Discharge Instructions   Discharge Instructions    Diet - low sodium heart healthy    Complete by:  As directed      Discharge instructions    Complete by:  As directed   Please follow up with your PCP IN 1 to 2 weeks, post hospitalization visit.  We have started you on prednisone for your sarcoidosis.          Current Discharge Medication List    START taking these medications   Details  amphetamine-dextroamphetamine (ADDERALL XR) 30 MG 24 hr capsule Take 1 capsule (30 mg total) by mouth daily. Refills: 0    cloNIDine (CATAPRES) 0.1 MG tablet Take 1 tablet (0.1 mg total) by mouth 2 (two) times daily. Qty: 60 tablet, Refills: 11    furosemide (LASIX) 40 MG tablet Take 1 tablet (40 mg total) by mouth 2 (two) times daily. Qty: 30 tablet    hydrALAZINE (APRESOLINE) 100 MG tablet Take 1 tablet (100 mg total) by mouth every 8 (eight) hours.    predniSONE (DELTASONE) 20 MG tablet Prednisone 20 mg daily for 4 day followed by  Prednisone 10 mg daily for 4 days.  Please see PCP before the steroids are completed. Qty: 7 tablet, Refills: 0    QUEtiapine (SEROQUEL XR) 50 MG TB24 24 hr tablet Take 2 tablets (100 mg total) by mouth at bedtime. Qty: 90 each    venlafaxine XR (EFFEXOR-XR) 75 MG 24 hr capsule Take 1 capsule (75 mg total) by mouth daily with breakfast.      CONTINUE these medications which have CHANGED   Details  clorazepate (TRANXENE) 15 MG tablet Take 1 tablet (15 mg total) by mouth at bedtime. Qty: 10 tablet, Refills: 0      CONTINUE these medications which have NOT  CHANGED   Details  albuterol (PROVENTIL HFA;VENTOLIN HFA) 108 (90 Base) MCG/ACT inhaler Inhale 2 puffs into the lungs every 6 (six) hours as needed for wheezing or shortness of breath.    aspirin 81 MG tablet Take 81 mg by mouth daily.      atenolol (TENORMIN) 50 MG tablet TAKE 1 TABLET(50 MG) BY MOUTH DAILY Qty: 90 tablet, Refills: 1    calcitonin, salmon, (MIACALCIN/FORTICAL) 200 UNIT/ACT nasal spray Place 1 spray into both nostrils daily.  Refills: 2    ferrous sulfate 325 (65 FE) MG tablet Take 325 mg by mouth daily with breakfast.    fluticasone (FLONASE) 50 MCG/ACT nasal spray Place 2 sprays into both nostrils daily.    glucosamine-chondroitin 500-400 MG tablet Take 1 tablet by mouth daily.     Multiple Vitamins-Minerals (MULTIVITAMIN WITH MINERALS) tablet Take 1 tablet by mouth daily.    traMADol (ULTRAM) 50 MG tablet Take 1 tablet (50 mg total) by mouth every 12 (twelve) hours as needed. Qty: 60 tablet, Refills: 0    DILT-XR 240 MG 24 hr capsule TAKE 1 CAPSULE BY  MOUTH DAILY Qty: 90 capsule, Refills: 0      STOP taking these medications     amphetamine-dextroamphetamine (ADDERALL) 30 MG tablet      HYDROcodone-acetaminophen (NORCO/VICODIN) 5-325 MG tablet      PRISTIQ 100 MG 24 hr tablet      torsemide (DEMADEX) 20 MG tablet      zaleplon (SONATA) 10 MG capsule      diltiazem (CARTIA XT) 240 MG 24 hr capsule      valsartan (DIOVAN) 320 MG tablet        Allergies  Allergen Reactions  . Chocolate Other (See Comments)    SOMETIMES CAUSES SEVERE HEADACHES  . Floxin [Ocuflox]     shaky  . Fluoxetine Hcl     Suicidal thoughts  . Other Nausea And Vomiting    INSTANT ICED TEA PACKETS  . Sulfonamide Derivatives Rash   Follow-up Information    Follow up with Cathlean Cower, MD. Schedule an appointment as soon as possible for a visit in 1 week.   Specialties:  Internal Medicine, Radiology   Why:  post hospitalization visit.    Contact information:   [place] [address] [phone]        The results of significant diagnostics from this hospitalization (including imaging, microbiology, ancillary and laboratory) are listed below for reference.    Significant Diagnostic Studies: Dg Chest 2 View  12/21/2015  CLINICAL DATA:  Cough, confusion EXAM: CHEST  2 VIEW COMPARISON:  12/09/2015 FINDINGS: Cardiomediastinal silhouette is unremarkable. No acute infiltrate or pleural effusion. No pulmonary edema. Mild degenerative changes mid thoracic spine. IMPRESSION: No active cardiopulmonary disease. Electronically Signed   By: Lahoma Crocker M.D.   On: 12/21/2015 14:05   Dg Chest 2 View  12/09/2015  CLINICAL DATA:  Altered mental status. EXAM: CHEST  2 VIEW COMPARISON:  October 12, 2015. FINDINGS: The heart size and mediastinal contours are within normal limits. Both lungs are clear. No pneumothorax or pleural effusion is noted. The visualized skeletal structures are unremarkable. IMPRESSION: No active cardiopulmonary disease. Electronically Signed   By: Marijo Conception, M.D.   On: 12/09/2015 19:44   Ct Head Wo Contrast  12/09/2015  CLINICAL DATA:  Altered mental status. EXAM: CT HEAD WITHOUT CONTRAST TECHNIQUE: Contiguous axial images were obtained from the base of the skull through the vertex without intravenous contrast. COMPARISON:  CT scan of July 23, 2012. FINDINGS: Bony calvarium appears intact. No mass effect or midline shift is noted. Ventricular size is within normal limits. There is no evidence of mass lesion, hemorrhage or acute infarction. IMPRESSION: Normal head CT. Electronically Signed   By: Marijo Conception, M.D.   On: 12/09/2015 19:55   Mr Brain Wo Contrast  12/12/2015  CLINICAL DATA:  65 year old hypertensive female with history of sarcoidosis, chronic kidney disease and migraines. Episode of slurred speech with difficulty finding words. Confusion. Subsequent encounter. EXAM: MRI HEAD WITHOUT CONTRAST TECHNIQUE: Multiplanar,  multiecho pulse sequences of the brain and surrounding structures were obtained without intravenous contrast. COMPARISON:  12/09/2015 CT.  05/08/2008 MR. FINDINGS: Exam is significantly motion degraded. No acute infarct or intracranial hemorrhage. Minimal nonspecific white matter changes may be related to small vessel disease or migraine headaches. Mild global atrophy without hydrocephalus. No intracranial mass lesion noted on this unenhanced exam. Major intracranial vascular structures are patent. No acute orbital abnormality noted. Cervical medullary junction appears intact. IMPRESSION: Significantly motion degraded exam without evidence of acute infarct. Electronically Signed  By: Genia Del M.D.   On: 12/12/2015 14:35    Microbiology: No results found for this or any previous visit (from the past 240 hour(s)).   Labs: Basic Metabolic Panel:  Recent Labs Lab 12/17/15 0549 12/18/15 0536 12/19/15 0618 12/21/15 0507 12/22/15 1238  NA 137 137 136 137 134*  K 3.6 3.5 3.1* 3.7 4.3  CL 102 102 103 105 101  CO2 24 25 24 23 23   GLUCOSE 114* 131* 109* 117* 273*  BUN 43* 43* 40* 35* 32*  CREATININE 2.01* 2.14* 2.06* 1.73* 1.70*  CALCIUM 9.0 9.0 8.6* 8.2* 8.6*   Liver Function Tests: No results for input(s): AST, ALT, ALKPHOS, BILITOT, PROT, ALBUMIN in the last 168 hours. No results for input(s): LIPASE, AMYLASE in the last 168 hours. No results for input(s): AMMONIA in the last 168 hours. CBC:  Recent Labs Lab 12/18/15 0536 12/21/15 0507  WBC 10.5 10.7*  HGB 11.5* 11.1*  HCT 36.1 35.6*  MCV 81.1 82.6  PLT 321 289   Cardiac Enzymes: No results for input(s): CKTOTAL, CKMB, CKMBINDEX, TROPONINI in the last 168 hours. BNP: BNP (last 3 results) No results for input(s): BNP in the last 8760 hours.  ProBNP (last 3 results) No results for input(s): PROBNP in the last 8760 hours.  CBG: No results for input(s): GLUCAP in the last 168 hours.     SignedHosie Poisson MD.   [facility] 12/22/2015, 1:41 PM
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
## 2015-12-23 MED ORDER — QUETIAPINE FUMARATE ER 200 MG PO TB24
200.0000 mg | ORAL_TABLET | Freq: Every day | ORAL | Status: DC
  Administered 2015-12-23 – 2015-12-26 (×4): 200 mg via ORAL
  Filled 2015-12-23 (×5): qty 1

## 2015-12-23 NOTE — Discharge Summary (Signed)
Physician Discharge Summary  Tara [name] DOB: 1951/01/18 DOA: 12/09/2015  PCP: Cathlean Cower, MD  Admit date: 12/09/2015 Discharge date: 12/23/2015  Time spent: 25 minutes  Recommendations for Outpatient Follow-up:  1. Follow up with PCP in 1 to 2 weeks.  2. Please check your renal function at your pcp office.  3. We have started you on steroids, with a slow taper, please follow up with your PCP regarding that.  4. followup with your nephrologist Dr Elmarie Shiley in 2 weeks. Please check renal function and calcium level.    Discharge Diagnoses:  Principal Problem:   Metabolic encephalopathy Active Problems:   Sarcoidosis ([place])   Essential hypertension   Hypercalcemia   Acute on chronic kidney failure (HCC)   UTI (urinary tract infection)   CKD (chronic kidney disease) stage 4, GFR 15-29 ml/min (HCC)   Hypokalemia   Elevated troponin   Acute kidney injury ([place])   Major depressive disorder, recurrent episode, mild ([place])   Discharge Condition: improved  Diet recommendation: low sodium diet.   Filed Weights   12/09/15 1825  Weight: 106.595 kg (235 lb)    History of present illness:  Tara [name] is a 65 y.o. female with a history of Sarcoidosis, HTN, CKD who did not show up for work today and her colleagues had [employer] go to her home and she was found to be severely confused.In the ED, patient was evaluated and a CT scan of the head, Chest x-ray all were unremarkable.BMET Hypercalcemia at 14.5, and Hypokalemia at 2.3, along with a elevated BUN/Cr Of 23/2.82. She also had an elevated Troponin level at 0.16.She was referred for admission.Patient was seen by nephrology, calcium has been normalized after aggressive IV fluid hydration, diaphoresis, pamidronate, Miacalcin. Nephrology has signed off. She was also placed on prednisone and she will need to continue prednisone and outpatient follow-up with Dr. Annamaria Boots, her pulmonologist. Patient is being closely followed by  psychiatry service, patient started on Seroquel, Ativan, venlafaxine, clorazepate. She will need inpatient psych when bed available.   Hospital Course:  Acute Metabolic encephalopathy  - Likely due to profound dehydration, hypercalcemia and uremia. Improving but still has tangential speech - CT head negative, ammonia level 17 - Patient was placed on aggressive IV fluid hydration, IV pamidronate 1 - Calcium has trended down, Now normalized. - MRI brain Negative for any acute CVA - Per psychiatry, patient was started on Adderall XR, venlafaxine XL, clorazepate, started on Seroquel on 6/1. Psych following.  - patient's B1, B12 levels normal.  - patient medically stable for discharge  Sarcoidosis [name]) - Follows Dr. Annamaria Boots, last visit 04/2015, sarcoidosis diagnosed in 2009. Patient was hospitalized in 2014 with similar symptoms, hypercalcemia and renal insufficiency and was placed on prednisone with slow taper at this time - Continue prednisone  Uncontrolled hypertension - BP now better controlled, continue Lasix, atenolol, hydralazine, clonidine  Hypercalcemia: Due to underlying sarcoidosis, not on steroids prior to admission - Patient received aggressive IV fluid hydration, received IV pamidronate 1, continue miacalcin - Started prednisone - PTH appropriately low 13, vitamin D levels 1,25 58.6, PTH RP negative - ACE level high, 90 will need to stay on prednisone  Acute on chronic kidney failure ([place]): Due to profound dehydration and hypercalcemia, has underlying chronic kidney disease, stage IV - Creatinine stable, continue Lasix 40 mg twice a day   Escherichia coli UTI (urinary tract infection) - Urine culture showing Escherichia coli, s/p 8 days of Rocephin  Elevated troponin: Likely due to acute  renal insufficiency and hypercalcemia, demand ischemia.  - No chest pain or shortness of breath currently - 2-D echo showed EF of 60-65% with normal wall motion  Generalized  debility - Patient will need inpatient psych at the time of discharge.  - Medically stable   Procedures:  None  Consultations:  Psychiatry.  Nephrology  Discharge Exam: Filed Vitals:   12/23/15 0551 12/23/15 1306  BP: 133/66 159/62  Pulse: 70 73  Temp: 98.1 F (36.7 C) 98 F (36.7 C)  Resp: 20 20    General: alert comfortable. confused Cardiovascular: s1s2 Respiratory: ctab  Discharge Instructions   Discharge Instructions    Diet - low sodium heart healthy    Complete by:  As directed      Discharge instructions    Complete by:  As directed   Please follow up with your PCP IN 1 to 2 weeks, post hospitalization visit.  We have started you on prednisone for your sarcoidosis.          Current Discharge Medication List    START taking these medications   Details  amphetamine-dextroamphetamine (ADDERALL XR) 30 MG 24 hr capsule Take 1 capsule (30 mg total) by mouth daily. Refills: 0    cloNIDine (CATAPRES) 0.1 MG tablet Take 1 tablet (0.1 mg total) by mouth 2 (two) times daily. Qty: 60 tablet, Refills: 11    furosemide (LASIX) 40 MG tablet Take 1 tablet (40 mg total) by mouth 2 (two) times daily. Qty: 30 tablet    hydrALAZINE (APRESOLINE) 100 MG tablet Take 1 tablet (100 mg total) by mouth every 8 (eight) hours.    predniSONE (DELTASONE) 20 MG tablet Prednisone 20 mg daily for 4 day followed by  Prednisone 10 mg daily for 4 days.  Please see PCP before the steroids are completed. Qty: 7 tablet, Refills: 0    QUEtiapine (SEROQUEL XR) 50 MG TB24 24 hr tablet Take 2 tablets (100 mg total) by mouth at bedtime. Qty: 90 each    venlafaxine XR (EFFEXOR-XR) 75 MG 24 hr capsule Take 1 capsule (75 mg total) by mouth daily with breakfast.      CONTINUE these medications which have CHANGED   Details  clorazepate (TRANXENE) 15 MG tablet Take 1 tablet (15 mg total) by mouth at bedtime. Qty: 10 tablet, Refills: 0      CONTINUE these medications which have NOT  CHANGED   Details  albuterol (PROVENTIL HFA;VENTOLIN HFA) 108 (90 Base) MCG/ACT inhaler Inhale 2 puffs into the lungs every 6 (six) hours as needed for wheezing or shortness of breath.    aspirin 81 MG tablet Take 81 mg by mouth daily.      atenolol (TENORMIN) 50 MG tablet TAKE 1 TABLET(50 MG) BY MOUTH DAILY Qty: 90 tablet, Refills: 1    calcitonin, salmon, (MIACALCIN/FORTICAL) 200 UNIT/ACT nasal spray Place 1 spray into both nostrils daily.  Refills: 2    ferrous sulfate 325 (65 FE) MG tablet Take 325 mg by mouth daily with breakfast.    fluticasone (FLONASE) 50 MCG/ACT nasal spray Place 2 sprays into both nostrils daily.    glucosamine-chondroitin 500-400 MG tablet Take 1 tablet by mouth daily.     Multiple Vitamins-Minerals (MULTIVITAMIN WITH MINERALS) tablet Take 1 tablet by mouth daily.    traMADol (ULTRAM) 50 MG tablet Take 1 tablet (50 mg total) by mouth every 12 (twelve) hours as needed. Qty: 60 tablet, Refills: 0    DILT-XR 240 MG 24 hr capsule TAKE 1 CAPSULE BY  MOUTH DAILY Qty: 90 capsule, Refills: 0      STOP taking these medications     amphetamine-dextroamphetamine (ADDERALL) 30 MG tablet      HYDROcodone-acetaminophen (NORCO/VICODIN) 5-325 MG tablet      PRISTIQ 100 MG 24 hr tablet      torsemide (DEMADEX) 20 MG tablet      zaleplon (SONATA) 10 MG capsule      diltiazem (CARTIA XT) 240 MG 24 hr capsule      valsartan (DIOVAN) 320 MG tablet        Allergies  Allergen Reactions  . Chocolate Other (See Comments)    SOMETIMES CAUSES SEVERE HEADACHES  . Floxin [Ocuflox]     shaky  . Fluoxetine Hcl     Suicidal thoughts  . Other Nausea And Vomiting    INSTANT ICED TEA PACKETS  . Sulfonamide Derivatives Rash   Follow-up Information    Follow up with Cathlean Cower, MD. Schedule an appointment as soon as possible for a visit in 1 week.   Specialties:  Internal Medicine, Radiology   Why:  post hospitalization visit.    Contact information:   [place] [address] [phone]        The results of significant diagnostics from this hospitalization (including imaging, microbiology, ancillary and laboratory) are listed below for reference.    Significant Diagnostic Studies: Dg Chest 2 View  12/21/2015  CLINICAL DATA:  Cough, confusion EXAM: CHEST  2 VIEW COMPARISON:  12/09/2015 FINDINGS: Cardiomediastinal silhouette is unremarkable. No acute infiltrate or pleural effusion. No pulmonary edema. Mild degenerative changes mid thoracic spine. IMPRESSION: No active cardiopulmonary disease. Electronically Signed   By: Lahoma Crocker M.D.   On: 12/21/2015 14:05   Dg Chest 2 View  12/09/2015  CLINICAL DATA:  Altered mental status. EXAM: CHEST  2 VIEW COMPARISON:  October 12, 2015. FINDINGS: The heart size and mediastinal contours are within normal limits. Both lungs are clear. No pneumothorax or pleural effusion is noted. The visualized skeletal structures are unremarkable. IMPRESSION: No active cardiopulmonary disease. Electronically Signed   By: Marijo Conception, M.D.   On: 12/09/2015 19:44   Ct Head Wo Contrast  12/09/2015  CLINICAL DATA:  Altered mental status. EXAM: CT HEAD WITHOUT CONTRAST TECHNIQUE: Contiguous axial images were obtained from the base of the skull through the vertex without intravenous contrast. COMPARISON:  CT scan of July 23, 2012. FINDINGS: Bony calvarium appears intact. No mass effect or midline shift is noted. Ventricular size is within normal limits. There is no evidence of mass lesion, hemorrhage or acute infarction. IMPRESSION: Normal head CT. Electronically Signed   By: Marijo Conception, M.D.   On: 12/09/2015 19:55   Mr Brain Wo Contrast  12/12/2015  CLINICAL DATA:  65 year old hypertensive female with history of sarcoidosis, chronic kidney disease and migraines. Episode of slurred speech with difficulty finding words. Confusion. Subsequent encounter. EXAM: MRI HEAD WITHOUT CONTRAST TECHNIQUE: Multiplanar,  multiecho pulse sequences of the brain and surrounding structures were obtained without intravenous contrast. COMPARISON:  12/09/2015 CT.  05/08/2008 MR. FINDINGS: Exam is significantly motion degraded. No acute infarct or intracranial hemorrhage. Minimal nonspecific white matter changes may be related to small vessel disease or migraine headaches. Mild global atrophy without hydrocephalus. No intracranial mass lesion noted on this unenhanced exam. Major intracranial vascular structures are patent. No acute orbital abnormality noted. Cervical medullary junction appears intact. IMPRESSION: Significantly motion degraded exam without evidence of acute infarct. Electronically Signed  By: Genia Del M.D.   On: 12/12/2015 14:35    Microbiology: No results found for this or any previous visit (from the past 240 hour(s)).   Labs: Basic Metabolic Panel:  Recent Labs Lab 12/17/15 0549 12/18/15 0536 12/19/15 0618 12/21/15 0507 12/22/15 1238  NA 137 137 136 137 134*  K 3.6 3.5 3.1* 3.7 4.3  CL 102 102 103 105 101  CO2 24 25 24 23 23   GLUCOSE 114* 131* 109* 117* 273*  BUN 43* 43* 40* 35* 32*  CREATININE 2.01* 2.14* 2.06* 1.73* 1.70*  CALCIUM 9.0 9.0 8.6* 8.2* 8.6*   Liver Function Tests: No results for input(s): AST, ALT, ALKPHOS, BILITOT, PROT, ALBUMIN in the last 168 hours. No results for input(s): LIPASE, AMYLASE in the last 168 hours. No results for input(s): AMMONIA in the last 168 hours. CBC:  Recent Labs Lab 12/18/15 0536 12/21/15 0507  WBC 10.5 10.7*  HGB 11.5* 11.1*  HCT 36.1 35.6*  MCV 81.1 82.6  PLT 321 289   Cardiac Enzymes: No results for input(s): CKTOTAL, CKMB, CKMBINDEX, TROPONINI in the last 168 hours. BNP: BNP (last 3 results) No results for input(s): BNP in the last 8760 hours.  ProBNP (last 3 results) No results for input(s): PROBNP in the last 8760 hours.  CBG: No results for input(s): GLUCAP in the last 168 hours.     SignedHosie Poisson MD.   [facility] 12/23/2015, 5:28 PM   Addendum: currently awaiting for a bed at the facility. No new changes.   Hosie Poisson, MD [phone]
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
## 2015-12-24 NOTE — Progress Notes (Signed)
PROGRESS NOTE  Tara [name] [id number] DOB: August 28, 1950 DOA: 12/09/2015 PCP: Cathlean Cower, MD   LOS: 15 days   Brief Narrative: Tara [name] is a 65 y.o. female with a history of Sarcoidosis, HTN, CKD who did not show up for work today and her colleagues had GPD go to her home and she was found to be severely confused.In the ED, patient was evaluated and a CT scan of the head, Chest x-ray all were unremarkable.BMET Hypercalcemia at 14.5, and Hypokalemia at 2.3, along with a elevated BUN/Cr Of 23/2.82. She also had an elevated Troponin level at 0.16.She was referred for admission.Patient was seen by nephrology, calcium has been normalized after aggressive IV fluid hydration, diaphoresis, pamidronate, Miacalcin. Nephrology has signed off. She was also placed on prednisone and she will need to continue prednisone and outpatient follow-up with Dr. Annamaria Boots, her pulmonologist. Patient is being closely followed by psychiatry service, patient started on Seroquel, Ativan, venlafaxine, clorazepate. She will need inpatient psych and SNF when bed available.   Assessment & Plan: Principal Problem:   Metabolic encephalopathy Active Problems:   Sarcoidosis ([place])   Essential hypertension   Hypercalcemia   Acute on chronic kidney failure (HCC)   UTI (urinary tract infection)   CKD (chronic kidney disease) stage 4, GFR 15-29 ml/min (HCC)   Hypokalemia   Elevated troponin   Acute kidney injury ([place])   Major depressive disorder, recurrent episode, mild (HCC)   Acute Metabolic encephalopathy  - Likely due to profound dehydration, hypercalcemia and uremia. Improving but still has tangential speech. - CT head negative, ammonia level 17 - Patient was placed on aggressive IV fluid hydration, IV pamidronate 1 - Calcium has trended down, Now normalized. - MRI brain Negative for any acute CVA - Per psychiatry, patient was started on Adderall XR, venlafaxine XL, clorazepate, started on Seroquel on 6/1.  Psych following. Patient is very tangential TODAY. - normal B1, B12. Empiric thiamine for now. - patient medically stable for discharge  Sarcoidosis [facility]) - Follows Dr. Annamaria Boots, last visit 04/2015, sarcoidosis diagnosed in 2009. Patient was hospitalized in 2014 with similar symptoms, hypercalcemia and renal insufficiency and was placed on prednisone with slow taper at that time - Continue prednisone  Uncontrolled hypertension - BP now better controlled, continue Lasix, atenolol, hydralazine, clonidine  Hypercalcemia: Due to underlying sarcoidosis, not on steroids prior to admission - Patient received aggressive IV fluid hydration, received IV pamidronate 1, continue miacalcin - Started prednisone - PTH appropriately low 13, vitamin D levels 1,25 58.6, PTH RP negative - ACE level high, 90 will need to stay on prednisone  Acute on chronic kidney failure ([place]): Due to profound dehydration and hypercalcemia, has underlying chronic kidney disease, stage IV - Creatinine stable, continue Lasix 40 mg twice a day   Escherichia coli UTI (urinary tract infection) - Urine culture showing Escherichia coli, s/p 8 days of Rocephin. Completed the treatement.  Elevated troponin: Likely due to acute renal insufficiency and hypercalcemia, demand ischemia - No chest pain or shortness of breath currently - 2-D echo showed EF of 60-65% with normal wall motion  Generalized debility - Patient will need inpatient psych at the time of discharge.  - Medically stable   DVT prophylaxis: Lovenox Code Status: Full Family Communication: called sister several times, no answer, could not leave message due to full mailbox.  Disposition Plan: inpatient psych/ SNF  Consultants:   Psychiatry   Nephrology  Procedures:   None   Antimicrobials:  Ceftriaxone 5/26 >> 6/3   Subjective: -  remains confused. No new complaints  Objective: Filed Vitals:   12/24/15 0613 12/24/15 1022 12/24/15 1104 12/24/15  1344  BP: 131/72 117/54 113/58 134/84  Pulse: 84 91 93 75  Temp: 98.2 F (36.8 C)  97.4 F (36.3 C) 98.1 F (36.7 C)  TempSrc: Oral  Oral Oral  Resp: 17  19 16   Height:      Weight:      SpO2: 99%  100% 100%    Intake/Output Summary (Last 24 hours) at 12/24/15 1752 Last data filed at 12/24/15 1400  Gross per 24 hour  Intake    822 ml  Output   1000 ml  Net   -178 ml   Filed Weights   12/09/15 1825  Weight: 106.595 kg (235 lb)    Examination: Constitutional: NAD Filed Vitals:   12/24/15 0613 12/24/15 1022 12/24/15 1104 12/24/15 1344  BP: 131/72 117/54 113/58 134/84  Pulse: 84 91 93 75  Temp: 98.2 F (36.8 C)  97.4 F (36.3 C) 98.1 F (36.7 C)  TempSrc: Oral  Oral Oral  Resp: 17  19 16   Height:      Weight:      SpO2: 99%  100% 100%   ENMT: Mucous membranes are moist.  Respiratory: clear to auscultation bilaterally, no wheezing, no crackles. Normal respiratory effort. No accessory muscle use.  Cardiovascular: Regular rate and rhythm, no murmurs / rubs / gallops. No LE edema.    Data Reviewed: I have personally reviewed following labs and imaging studies  CBC:  Recent Labs Lab 12/18/15 0536 12/21/15 0507  WBC 10.5 10.7*  HGB 11.5* 11.1*  HCT 36.1 35.6*  MCV 81.1 82.6  PLT 321 [id number]   Basic Metabolic Panel:  Recent Labs Lab 12/18/15 0536 12/19/15 0618 12/21/15 0507 12/22/15 1238  NA 137 136 137 134*  K 3.5 3.1* 3.7 4.3  CL 102 103 105 101  CO2 25 24 23 23   GLUCOSE 131* 109* 117* 273*  BUN 43* 40* 35* 32*  CREATININE 2.14* 2.06* 1.73* 1.70*  CALCIUM 9.0 8.6* 8.2* 8.6*   GFR: Estimated Creatinine Clearance: 41.8 mL/min (by C-G formula based on Cr of 1.7). Liver Function Tests: No results for input(s): AST, ALT, ALKPHOS, BILITOT, PROT, ALBUMIN in the last 168 hours. No results for input(s): LIPASE, AMYLASE in the last 168 hours. No results for input(s): AMMONIA in the last 168 hours. Coagulation Profile: No results for input(s): INR,  PROTIME in the last 168 hours. Cardiac Enzymes: No results for input(s): CKTOTAL, CKMB, CKMBINDEX, TROPONINI in the last 168 hours. BNP (last 3 results) No results for input(s): PROBNP in the last 8760 hours. HbA1C: No results for input(s): HGBA1C in the last 72 hours. CBG: No results for input(s): GLUCAP in the last 168 hours. Lipid Profile: No results for input(s): CHOL, HDL, LDLCALC, TRIG, CHOLHDL, LDLDIRECT in the last 72 hours. Thyroid Function Tests: No results for input(s): TSH, T4TOTAL, FREET4, T3FREE, THYROIDAB in the last 72 hours. Anemia Panel: No results for input(s): VITAMINB12, FOLATE, FERRITIN, TIBC, IRON, RETICCTPCT in the last 72 hours. Urine analysis:    Component Value Date/Time   COLORURINE YELLOW 12/22/2015 [place] 12/22/2015 0557   LABSPEC 1.008 12/22/2015 0557   PHURINE 6.5 12/22/2015 0557   GLUCOSEU 100* 12/22/2015 0557   GLUCOSEU NEGATIVE 09/27/2015 0849   HGBUR NEGATIVE 12/22/2015 0557   BILIRUBINUR NEGATIVE 12/22/2015 0557   KETONESUR NEGATIVE 12/22/2015 0557   PROTEINUR NEGATIVE 12/22/2015 0557   UROBILINOGEN 0.2 09/27/2015 0849  NITRITE NEGATIVE 12/22/2015 0557   LEUKOCYTESUR NEGATIVE 12/22/2015 0557   Sepsis Labs: Invalid input(s): PROCALCITONIN, LACTICIDVEN  No results found for this or any previous visit (from the past 240 hour(s)).    Radiology Studies: No results found.   Scheduled Meds: . amphetamine-dextroamphetamine  30 mg Oral Daily  . atenolol  50 mg Oral Daily  . calcitonin (salmon)  1 spray Alternating Nares Daily  . cloNIDine  0.1 mg Oral BID  . clorazepate  15 mg Oral QHS  . diltiazem  240 mg Oral Daily  . enoxaparin (LOVENOX) injection  40 mg Subcutaneous Q24H  . furosemide  40 mg Oral BID  . hydrALAZINE  100 mg Oral Q8H  . predniSONE  20 mg Oral Q breakfast  . QUEtiapine  200 mg Oral QHS  . sodium bicarbonate  1,300 mg Oral BID  . venlafaxine XR  75 mg Oral Q breakfast   Continuous Infusions:     Hosie Poisson, MD,  [facility] Pager [phone]   If 7PM-7AM, please contact night-coverage [email or web address] Password [email or web address] 12/24/2015, 5:52 PM

## 2015-12-24 NOTE — Progress Notes (Signed)
Physical Therapy Treatment Patient Details Name: Tara [name] MRN: [id number] DOB: [birth year] Today's Date: 12/24/2015    History of Present Illness Patient is a 65 y/o female with hx of chronic kidney failure, hx sarcoidosis, DM, chronic knee pain/stability presents with confusion. Found to have metabolic encephalopathy- due to Hypercalcemia and Uremia.    PT Comments    Pt is significantly more tangential today compared to last session.  She is unable to stay focused and needs manual/tactile cues to initiate mobility.  PT did not feel safe ambulating her into the hallway today, so stayed in room.  Pt needed more assist.  Nothing she is saying is related to topic at hand or commands.  PT continues to recommend SNF at discharge.    Follow Up Recommendations  SNF     Equipment Recommendations  None recommended by PT    Recommendations for Other Services   NA     Precautions / Restrictions Precautions Precautions: Fall    Mobility  Bed Mobility Overal bed mobility: Needs Assistance Bed Mobility: Supine to Sit     Supine to sit: Min guard     General bed mobility comments: PT had to initiate movement to EOB, but once initiated, pt followed  Transfers Overall transfer level: Needs assistance Equipment used: 1 person hand held assist Transfers: Sit to/from Stand Sit to Stand: Min assist         General transfer comment: Min assist for safety and balance.  Once again, PT had to initiate forward momentum to come to stand as pt was too distracted to initiate to verbal command only.   Ambulation/Gait Ambulation/Gait assistance: Min assist Ambulation Distance (Feet): 20 Feet Assistive device: 1 person hand held assist Gait Pattern/deviations: Step-through pattern;Staggering left;Staggering right     General Gait Details: Manual assist to steer pt to bathroom and to chair on far side of bed.  PT did not feel safe taking pt into hallway today because I was worried that she  would not want to go back in her room and she was significantly more unsteady.  Heavy min assist for balance during short distance, in room gait today.           Balance Overall balance assessment: Needs assistance Sitting-balance support: Feet supported;No upper extremity supported Sitting balance-Leahy Scale: Good     Standing balance support: Single extremity supported;Bilateral upper extremity supported;No upper extremity supported Standing balance-Leahy Scale: Poor Standing balance comment: needs external assist today during dynamic mobility.                     Cognition Arousal/Alertness: Awake/alert Behavior During Therapy: Restless Overall Cognitive Status: Impaired/Different from baseline Area of Impairment: Orientation;Memory;Attention;Following commands;Safety/judgement;Awareness;Problem solving Orientation Level: Disoriented to;Person;Place;Time;Situation Current Attention Level: Focused Memory: Decreased recall of precautions;Decreased short-term memory Following Commands: Follows one step commands inconsistently Safety/Judgement: Decreased awareness of safety;Decreased awareness of deficits Awareness: Intellectual Problem Solving: Decreased initiation;Requires verbal cues;Requires tactile cues General Comments: Pt not making any sense today.  Extremely tangental           Pertinent Vitals/Pain Pain Assessment: Faces Pain Score: 0-No pain           PT Goals (current goals can now be found in the care plan section) Acute Rehab PT Goals Patient Stated Goal: none stated Progress towards PT goals: Not progressing toward goals - comment (more tangental today)    Frequency  Min 2X/week (would benefit from more, 2xs is departmental standard)    PT Plan  Frequency needs to be updated       End of Session Equipment Utilized During Treatment: Gait belt Activity Tolerance: Other (comment) (limited by poor cognition. ) Patient left: in chair;with call  bell/phone within reach;with chair alarm set     Time: 1610-9604 PT Time Calculation (min) (ACUTE ONLY): 20 min  Charges:  $Therapeutic Activity: 8-22 mins                      Jasmyne Lodato B. Juanmanuel Marohl, PT, DPT [phone]   12/24/2015, 10:11 AM

## 2015-12-24 NOTE — Plan of Care (Signed)
Problem: Health Behavior/Discharge Planning: Goal: Ability to manage health-related needs will improve Outcome: Not Progressing Pt cannot care for herself or meds etc

## 2015-12-25 MED ORDER — PREDNISONE 10 MG PO TABS
10.0000 mg | ORAL_TABLET | Freq: Every day | ORAL | Status: DC
  Administered 2015-12-26 – 2015-12-27 (×2): 10 mg via ORAL
  Filled 2015-12-25 (×2): qty 1

## 2015-12-25 NOTE — Progress Notes (Signed)
PROGRESS NOTE  Tara [name] [id number] DOB: [birth year] DOA: 12/09/2015 PCP: Cathlean Cower, MD   LOS: 16 days   Brief Narrative: Tara [name] is a 65 y.o. female with a history of Sarcoidosis, HTN, CKD who did not show up for work today and her colleagues had [employer] go to her home and she was found to be severely confused.In the ED, patient was evaluated and a CT scan of the head, Chest x-ray all were unremarkable.BMET Hypercalcemia at 14.5, and Hypokalemia at 2.3, along with a elevated BUN/Cr Of 23/2.82. She also had an elevated Troponin level at 0.16.She was referred for admission.Patient was seen by nephrology, calcium has been normalized after aggressive IV fluid hydration, diaphoresis, pamidronate, Miacalcin. Nephrology has signed off. She was also placed on prednisone and she will need to continue prednisone and outpatient follow-up with Dr. Annamaria Boots, her pulmonologist. Patient is being closely followed by psychiatry service, patient started on Seroquel, Ativan, venlafaxine, clorazepate. She will need inpatient psych and SNF when bed available.   Assessment & Plan: Principal Problem:   Metabolic encephalopathy Active Problems:   Sarcoidosis ([place])   Essential hypertension   Hypercalcemia   Acute on chronic kidney failure (HCC)   UTI (urinary tract infection)   CKD (chronic kidney disease) stage 4, GFR 15-29 ml/min (HCC)   Hypokalemia   Elevated troponin   Acute kidney injury ([place])   Major depressive disorder, recurrent episode, mild (HCC)   Acute Metabolic encephalopathy  - Likely due to profound dehydration, hypercalcemia and uremia. Improving but still has tangential speech. - CT head negative, ammonia level 17 - Patient was placed on aggressive IV fluid hydration, IV pamidronate 1 - Calcium has trended down, Now normalized. - MRI brain Negative for any acute CVA - Per psychiatry, patient was started on Adderall XR, venlafaxine XL, clorazepate, started on Seroquel on 6/1.  Psych following. Patient is very tangential TODAY. - normal B1, B12. Empiric thiamine for now. - patient medically stable for discharge - CURRENTLY AWAITING BED.   Sarcoidosis (HCC) - Follows Dr. Annamaria Boots, last visit 04/2015, sarcoidosis diagnosed in 2009. Patient was hospitalized in 2014 with similar symptoms, hypercalcemia and renal insufficiency and was placed on prednisone with slow taper at that time - Continue prednisone  Uncontrolled hypertension - BP now better controlled, continue Lasix, atenolol, hydralazine, clonidine  Hypercalcemia: Due to underlying sarcoidosis, not on steroids prior to admission - Patient received aggressive IV fluid hydration, received IV pamidronate 1, continue miacalcin - Started prednisone - PTH appropriately low 13, vitamin D levels 1,25 58.6, PTH RP negative - ACE level high, 90 will need to stay on prednisone  Acute on chronic kidney failure ([place]): Due to profound dehydration and hypercalcemia, has underlying chronic kidney disease, stage IV - Creatinine stable, continue Lasix 40 mg twice a day   Escherichia coli UTI (urinary tract infection) - Urine culture showing Escherichia coli, s/p 8 days of Rocephin. Completed the treatement.  Elevated troponin: Likely due to acute renal insufficiency and hypercalcemia, demand ischemia - No chest pain or shortness of breath currently - 2-D echo showed EF of 60-65% with normal wall motion  Generalized debility - Patient will need inpatient psych at the time of discharge.  - Medically stable   DVT prophylaxis: Lovenox Code Status: Full Family Communication: SPOKE TO SISTER ON 6/9.  Disposition Plan: inpatient psych/ SNF  Consultants:   Psychiatry   Nephrology  Procedures:   None   Antimicrobials:  Ceftriaxone 5/26 >> 6/3   Subjective: - remains confused. No  new complaints  Objective: Filed Vitals:   12/24/15 2012 12/24/15 2351 12/25/15 0430 12/25/15 1304  BP: 146/75 126/64 134/69  121/69  Pulse: 76 79 72 74  Temp: 98.4 F (36.9 C) 98.4 F (36.9 C) 98.2 F (36.8 C) 98.1 F (36.7 C)  TempSrc: Oral Oral Oral Oral  Resp: 16 16 16 18   Height:      Weight:      SpO2: 99% 96% 100% 96%    Intake/Output Summary (Last 24 hours) at 12/25/15 1502 Last data filed at 12/25/15 1347  Gross per 24 hour  Intake    480 ml  Output   1000 ml  Net   -520 ml   Filed Weights   12/09/15 1825  Weight: 106.595 kg (235 lb)    Examination: Constitutional: NAD Filed Vitals:   12/24/15 2012 12/24/15 2351 12/25/15 0430 12/25/15 1304  BP: 146/75 126/64 134/69 121/69  Pulse: 76 79 72 74  Temp: 98.4 F (36.9 C) 98.4 F (36.9 C) 98.2 F (36.8 C) 98.1 F (36.7 C)  TempSrc: Oral Oral Oral Oral  Resp: 16 16 16 18   Height:      Weight:      SpO2: 99% 96% 100% 96%   ENMT: Mucous membranes are moist.  Respiratory: clear to auscultation bilaterally, no wheezing, no crackles. Normal respiratory effort. No accessory muscle use.  Cardiovascular: Regular rate and rhythm, no murmurs / rubs / gallops. No LE edema.    Data Reviewed: I have personally reviewed following labs and imaging studies  CBC:  Recent Labs Lab 12/21/15 0507  WBC 10.7*  HGB 11.1*  HCT 35.6*  MCV 82.6  PLT [id number]   Basic Metabolic Panel:  Recent Labs Lab 12/19/15 0618 12/21/15 0507 12/22/15 1238  NA 136 137 134*  K 3.1* 3.7 4.3  CL 103 105 101  CO2 24 23 23   GLUCOSE 109* 117* 273*  BUN 40* 35* 32*  CREATININE 2.06* 1.73* 1.70*  CALCIUM 8.6* 8.2* 8.6*   GFR: Estimated Creatinine Clearance: 41.8 mL/min (by C-G formula based on Cr of 1.7). Liver Function Tests: No results for input(s): AST, ALT, ALKPHOS, BILITOT, PROT, ALBUMIN in the last 168 hours. No results for input(s): LIPASE, AMYLASE in the last 168 hours. No results for input(s): AMMONIA in the last 168 hours. Coagulation Profile: No results for input(s): INR, PROTIME in the last 168 hours. Cardiac Enzymes: No results for input(s):  CKTOTAL, CKMB, CKMBINDEX, TROPONINI in the last 168 hours. BNP (last 3 results) No results for input(s): PROBNP in the last 8760 hours. HbA1C: No results for input(s): HGBA1C in the last 72 hours. CBG: No results for input(s): GLUCAP in the last 168 hours. Lipid Profile: No results for input(s): CHOL, HDL, LDLCALC, TRIG, CHOLHDL, LDLDIRECT in the last 72 hours. Thyroid Function Tests: No results for input(s): TSH, T4TOTAL, FREET4, T3FREE, THYROIDAB in the last 72 hours. Anemia Panel: No results for input(s): VITAMINB12, FOLATE, FERRITIN, TIBC, IRON, RETICCTPCT in the last 72 hours. Urine analysis:    Component Value Date/Time   COLORURINE YELLOW 12/22/2015 [place] 12/22/2015 0557   LABSPEC 1.008 12/22/2015 0557   PHURINE 6.5 12/22/2015 0557   GLUCOSEU 100* 12/22/2015 0557   GLUCOSEU NEGATIVE 09/27/2015 0849   HGBUR NEGATIVE 12/22/2015 0557   BILIRUBINUR NEGATIVE 12/22/2015 0557   KETONESUR NEGATIVE 12/22/2015 0557   PROTEINUR NEGATIVE 12/22/2015 0557   UROBILINOGEN 0.2 09/27/2015 0849   NITRITE NEGATIVE 12/22/2015 0557   LEUKOCYTESUR NEGATIVE 12/22/2015 0557  Sepsis Labs: Invalid input(s): PROCALCITONIN, LACTICIDVEN  No results found for this or any previous visit (from the past 240 hour(s)).    Radiology Studies: No results found.   Scheduled Meds: . amphetamine-dextroamphetamine  30 mg Oral Daily  . atenolol  50 mg Oral Daily  . calcitonin (salmon)  1 spray Alternating Nares Daily  . cloNIDine  0.1 mg Oral BID  . clorazepate  15 mg Oral QHS  . diltiazem  240 mg Oral Daily  . enoxaparin (LOVENOX) injection  40 mg Subcutaneous Q24H  . furosemide  40 mg Oral BID  . hydrALAZINE  100 mg Oral Q8H  . predniSONE  20 mg Oral Q breakfast  . QUEtiapine  200 mg Oral QHS  . sodium bicarbonate  1,300 mg Oral BID  . venlafaxine XR  75 mg Oral Q breakfast   Continuous Infusions:    Hosie Poisson, MD,  [facility] Pager [phone]   If 7PM-7AM,  please contact night-coverage [email or web address] Password [email or web address] 12/25/2015, 3:02 PM

## 2015-12-26 NOTE — Progress Notes (Signed)
Physical Therapy Treatment Patient Details Name: Tara [name] MRN: [id number] DOB: March 27, 1951 Today's Date: 12/26/2015    History of Present Illness Patient is a 65 y/o female with hx of chronic kidney failure, hx sarcoidosis, DM, chronic knee pain/stability presents with confusion. Found to have metabolic encephalopathy- due to Hypercalcemia and Uremia.    PT Comments    Pt ambulated 150' with min HHA but was progressively more unsteady with distance in addition to being unable to hold any kind of conversation of follow commands, highly distracted by compulsory taking off and putting on of glasses. No insight into safety or awareness of deficits. Unsafe to be left alone for any period of time. PT will continue to follow.   Follow Up Recommendations  SNF;Supervision/Assistance - 24 hour     Equipment Recommendations  None recommended by PT    Recommendations for Other Services OT consult     Precautions / Restrictions Precautions Precautions: Fall Precaution Comments: acute expressive aphasia Restrictions Weight Bearing Restrictions: No    Mobility  Bed Mobility               General bed mobility comments: received in chair  Transfers Overall transfer level: Needs assistance Equipment used: None Transfers: Sit to/from Stand Sit to Stand: Min guard         General transfer comment: pt instructed to wait until gait belt was applied, she did not. Had the power to stand without assist but was unsteady and required min once up  Ambulation/Gait Ambulation/Gait assistance: Min assist Ambulation Distance (Feet): 150 Feet Assistive device: 1 person hand held assist Gait Pattern/deviations: Step-through pattern;Staggering left;Staggering right Gait velocity: WFL Gait velocity interpretation: at or above normal speed for age/gender General Gait Details: got progrerssively more unsteady with distance, did not feel safe walking her any further than this. She was still  taking glasses on and off and appeared to be unable to focus which caused her to be unsteady and several times took large steps to front or side, seemingly to avoid something. Tactile cues needed to guide her.    Stairs            Wheelchair Mobility    Modified Rankin (Stroke Patients Only)       Balance Overall balance assessment: Needs assistance Sitting-balance support: No upper extremity supported Sitting balance-Leahy Scale: Good     Standing balance support: No upper extremity supported Standing balance-Leahy Scale: Poor Standing balance comment: extremely unsafe with decreased balance reactions and awareness of fall risk                    Cognition Arousal/Alertness: Awake/alert Behavior During Therapy: Restless Overall Cognitive Status: Impaired/Different from baseline Area of Impairment: Orientation;Memory;Attention;Following commands;Safety/judgement;Awareness;Problem solving Orientation Level: Disoriented to;Person;Place;Time;Situation Current Attention Level:  (not even focused) Memory: Decreased short-term memory;Decreased recall of precautions Following Commands: Follows one step commands inconsistently Safety/Judgement: Decreased awareness of safety;Decreased awareness of deficits Awareness: Intellectual Problem Solving: Decreased initiation;Requires verbal cues;Requires tactile cues General Comments: pt repeatedly taking glasses on and off face, poked self in eye, but could not stop doing it even while ambulating. Throughout session was looking at letters and stating what they stood for, eg "P, now that's for patriarch, the father at the wedding". Could not hold any kind of conversation, followed a few directional commands but otherwise remained in her own world completely distracted by what was going on in her head    Exercises      General Comments General comments (  skin integrity, edema, etc.): pt had not eaten her lunch, could not attend to  it. Was pleasant but completely confused.       Pertinent Vitals/Pain Pain Assessment: Faces Faces Pain Scale: No hurt    Home Living                      Prior Function            PT Goals (current goals can now be found in the care plan section) Acute Rehab PT Goals Patient Stated Goal: none stated PT Goal Formulation: With patient Time For Goal Achievement: 01/09/16 Potential to Achieve Goals: Fair Progress towards PT goals: Not progressing toward goals - comment (limited by cognitive deficits)    Frequency  Min 2X/week    PT Plan Current plan remains appropriate    Co-evaluation             End of Session Equipment Utilized During Treatment: Gait belt Activity Tolerance: Treatment limited secondary to agitation Patient left: in chair;with chair alarm set;with call bell/phone within reach;with nursing/sitter in room     Time: [id number] PT Time Calculation (min) (ACUTE ONLY): 11 min  Charges:  $Gait Training: 8-22 mins                    G Codes:     Leighton Roach, PT  Acute Rehab Services  [phone]  Leighton Roach 12/26/2015, 2:49 PM

## 2015-12-26 NOTE — Progress Notes (Signed)
CSW spoke with patient's HCPOA (sister) Wannetta Sender. Wannetta Sender is very concerned with patient being discharged without knowing what is causing her altered mental status. CSW explained that the cause could be addressed in an outpatient setting, but Wannetta Sender is still concerned since she lives in [place] and is unable to help the patient. Wannetta Sender is working on getting access to patient's financial information so that she may arrange care on patient's behalf but has not yet been successful. CSW explained that patient was not safe to go home alone and Trish agreed but states that she is not able to bring patient to her house in [place], especially if she does not know patient's diagnosis. CSW explained that SNF is short term solution and would have to be paid privately if insurance does not authorize stay. Wannetta Sender stated she is not able to come to [facility] for a few days.  CSW to continue to follow.  Percell Locus Derry Arbogast LCSWA [phone]
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
## 2015-12-26 NOTE — Progress Notes (Signed)
PROGRESS NOTE  Tara [name] [id number] DOB: 05/31/1951 DOA: 12/09/2015 PCP: Cathlean Cower, MD   LOS: 17 days   Brief Narrative: Tara [name] is a 66 y.o. female with a history of Sarcoidosis, HTN, CKD who did not show up for work today and her colleagues had [employer] go to her home and she was found to be severely confused.In the ED, patient was evaluated and a CT scan of the head, Chest x-ray all were unremarkable.BMET Hypercalcemia at 14.5, and Hypokalemia at 2.3, along with a elevated BUN/Cr Of 23/2.82. She also had an elevated Troponin level at 0.16.She was referred for admission.Patient was seen by nephrology, calcium has been normalized after aggressive IV fluid hydration, diaphoresis, pamidronate, Miacalcin. Nephrology has signed off. She was also placed on prednisone and she will need to continue prednisone and outpatient follow-up with Dr. Annamaria Boots, her pulmonologist. Patient is being closely followed by psychiatry service, patient started on Seroquel, Ativan, venlafaxine, clorazepate. She will need  SNF when bed available.   Assessment & Plan: Principal Problem:   Metabolic encephalopathy Active Problems:   Sarcoidosis ([place])   Essential hypertension   Hypercalcemia   Acute on chronic kidney failure (HCC)   UTI (urinary tract infection)   CKD (chronic kidney disease) stage 4, GFR 15-29 ml/min (HCC)   Hypokalemia   Elevated troponin   Acute kidney injury ([place])   Major depressive disorder, recurrent episode, mild (HCC)   Acute Metabolic encephalopathy  - Likely due to profound dehydration, hypercalcemia and uremia. Improving but still has tangential speech. - CT head negative, ammonia level 17 - Patient was placed on aggressive IV fluid hydration, IV pamidronate 1 - Calcium has trended down, Now normalized. - MRI brain Negative for any acute CVA - Per psychiatry, patient was started on Adderall XR, venlafaxine XL, clorazepate, started on Seroquel on 6/1. Psych following.  Patient is very tangential TODAY. - normal B1, B12. Empiric thiamine for now. - patient medically stable for discharge - CURRENTLY AWAITING BED.   Sarcoidosis (HCC) - Follows Dr. Annamaria Boots, last visit 04/2015, sarcoidosis diagnosed in 2009. Patient was hospitalized in 2014 with similar symptoms, hypercalcemia and renal insufficiency and was placed on prednisone with slow taper at that time - Continue prednisone  Uncontrolled hypertension - BP now better controlled, continue Lasix, atenolol, hydralazine, clonidine  Hypercalcemia: Due to underlying sarcoidosis, not on steroids prior to admission - Patient received aggressive IV fluid hydration, received IV pamidronate 1, continue miacalcin - Started prednisone - PTH appropriately low 13, vitamin D levels 1,25 58.6, PTH RP negative - ACE level high, 90 will need to stay on prednisone  Acute on chronic kidney failure ([place]): Due to profound dehydration and hypercalcemia, has underlying chronic kidney disease, stage IV - Creatinine stable, continue Lasix 40 mg twice a day   Escherichia coli UTI (urinary tract infection) - Urine culture showing Escherichia coli, s/p 8 days of Rocephin. Completed the treatement.  Elevated troponin: Likely due to acute renal insufficiency and hypercalcemia, demand ischemia - No chest pain or shortness of breath currently - 2-D echo showed EF of 60-65% with normal wall motion  Generalized debility - Patient will need inpatient psych at the time of discharge.  - Medically stable   DVT prophylaxis: Lovenox Code Status: Full Family Communication: SPOKE TO SISTER ON 6/9.  Disposition Plan: inpatient psych/ SNF  Consultants:   Psychiatry   Nephrology  Procedures:   None   Antimicrobials:  Ceftriaxone 5/26 >> 6/3   Subjective: - remains confused. No new complaints  Objective: Filed Vitals:   12/26/15 0432 12/26/15 1101 12/26/15 1625 12/26/15 1813  BP: 134/74 142/78 162/75 140/56  Pulse: 76  82 77   Temp: 97.7 F (36.5 C) 98.3 F (36.8 C) 98.3 F (36.8 C)   TempSrc: Oral Oral Oral   Resp: 16 16 17    Height:      Weight:      SpO2: 97% 97% 99%     Intake/Output Summary (Last 24 hours) at 12/26/15 1848 Last data filed at 12/26/15 1600  Gross per 24 hour  Intake    240 ml  Output   1800 ml  Net  -1560 ml   Filed Weights   12/09/15 1825  Weight: 106.595 kg (235 lb)    Examination: Constitutional: NAD Filed Vitals:   12/26/15 0432 12/26/15 1101 12/26/15 1625 12/26/15 1813  BP: 134/74 142/78 162/75 140/56  Pulse: 76 82 77   Temp: 97.7 F (36.5 C) 98.3 F (36.8 C) 98.3 F (36.8 C)   TempSrc: Oral Oral Oral   Resp: 16 16 17    Height:      Weight:      SpO2: 97% 97% 99%    ENMT: Mucous membranes are moist.  Respiratory: clear to auscultation bilaterally, no wheezing, no crackles. Normal respiratory effort. No accessory muscle use.  Cardiovascular: Regular rate and rhythm, no murmurs / rubs / gallops. No LE edema.    Data Reviewed: I have personally reviewed following labs and imaging studies  CBC:  Recent Labs Lab 12/21/15 0507  WBC 10.7*  HGB 11.1*  HCT 35.6*  MCV 82.6  PLT [id number]   Basic Metabolic Panel:  Recent Labs Lab 12/21/15 0507 12/22/15 1238  NA 137 134*  K 3.7 4.3  CL 105 101  CO2 23 23  GLUCOSE 117* 273*  BUN 35* 32*  CREATININE 1.73* 1.70*  CALCIUM 8.2* 8.6*   GFR: Estimated Creatinine Clearance: 41.8 mL/min (by C-G formula based on Cr of 1.7). Liver Function Tests: No results for input(s): AST, ALT, ALKPHOS, BILITOT, PROT, ALBUMIN in the last 168 hours. No results for input(s): LIPASE, AMYLASE in the last 168 hours. No results for input(s): AMMONIA in the last 168 hours. Coagulation Profile: No results for input(s): INR, PROTIME in the last 168 hours. Cardiac Enzymes: No results for input(s): CKTOTAL, CKMB, CKMBINDEX, TROPONINI in the last 168 hours. BNP (last 3 results) No results for input(s): PROBNP in the last 8760  hours. HbA1C: No results for input(s): HGBA1C in the last 72 hours. CBG: No results for input(s): GLUCAP in the last 168 hours. Lipid Profile: No results for input(s): CHOL, HDL, LDLCALC, TRIG, CHOLHDL, LDLDIRECT in the last 72 hours. Thyroid Function Tests: No results for input(s): TSH, T4TOTAL, FREET4, T3FREE, THYROIDAB in the last 72 hours. Anemia Panel: No results for input(s): VITAMINB12, FOLATE, FERRITIN, TIBC, IRON, RETICCTPCT in the last 72 hours. Urine analysis:    Component Value Date/Time   COLORURINE YELLOW 12/22/2015 [place] 12/22/2015 0557   LABSPEC 1.008 12/22/2015 0557   PHURINE 6.5 12/22/2015 0557   GLUCOSEU 100* 12/22/2015 0557   GLUCOSEU NEGATIVE 09/27/2015 0849   HGBUR NEGATIVE 12/22/2015 0557   BILIRUBINUR NEGATIVE 12/22/2015 0557   KETONESUR NEGATIVE 12/22/2015 0557   PROTEINUR NEGATIVE 12/22/2015 0557   UROBILINOGEN 0.2 09/27/2015 0849   NITRITE NEGATIVE 12/22/2015 0557   LEUKOCYTESUR NEGATIVE 12/22/2015 0557   Sepsis Labs: Invalid input(s): PROCALCITONIN, LACTICIDVEN  No results found for this or any previous visit (from the past 240  hour(s)).    Radiology Studies: No results found.   Scheduled Meds: . amphetamine-dextroamphetamine  30 mg Oral Daily  . atenolol  50 mg Oral Daily  . calcitonin (salmon)  1 spray Alternating Nares Daily  . cloNIDine  0.1 mg Oral BID  . clorazepate  15 mg Oral QHS  . diltiazem  240 mg Oral Daily  . enoxaparin (LOVENOX) injection  40 mg Subcutaneous Q24H  . furosemide  40 mg Oral BID  . hydrALAZINE  100 mg Oral Q8H  . predniSONE  10 mg Oral Q breakfast  . QUEtiapine  200 mg Oral QHS  . sodium bicarbonate  1,300 mg Oral BID  . venlafaxine XR  75 mg Oral Q breakfast   Continuous Infusions:    Hosie Poisson, MD,  [facility] Pager [phone]   If 7PM-7AM, please contact night-coverage [email or web address] Password [facility] 12/26/2015, 6:48 PM

## 2015-12-27 DIAGNOSIS — N184 Chronic kidney disease, stage 4 (severe): Secondary | ICD-10-CM

## 2015-12-27 DIAGNOSIS — N189 Chronic kidney disease, unspecified: Secondary | ICD-10-CM | POA: Diagnosis not present

## 2015-12-27 DIAGNOSIS — I10 Essential (primary) hypertension: Secondary | ICD-10-CM | POA: Diagnosis not present

## 2015-12-27 DIAGNOSIS — M6259 Muscle wasting and atrophy, not elsewhere classified, multiple sites: Secondary | ICD-10-CM | POA: Diagnosis not present

## 2015-12-27 DIAGNOSIS — S37009S Unspecified injury of unspecified kidney, sequela: Secondary | ICD-10-CM | POA: Diagnosis not present

## 2015-12-27 DIAGNOSIS — G3184 Mild cognitive impairment, so stated: Secondary | ICD-10-CM | POA: Diagnosis not present

## 2015-12-27 DIAGNOSIS — F333 Major depressive disorder, recurrent, severe with psychotic symptoms: Secondary | ICD-10-CM | POA: Diagnosis not present

## 2015-12-27 DIAGNOSIS — N390 Urinary tract infection, site not specified: Secondary | ICD-10-CM | POA: Diagnosis not present

## 2015-12-27 DIAGNOSIS — N179 Acute kidney failure, unspecified: Secondary | ICD-10-CM | POA: Diagnosis not present

## 2015-12-27 DIAGNOSIS — F339 Major depressive disorder, recurrent, unspecified: Secondary | ICD-10-CM | POA: Diagnosis not present

## 2015-12-27 DIAGNOSIS — N19 Unspecified kidney failure: Secondary | ICD-10-CM | POA: Diagnosis not present

## 2015-12-27 DIAGNOSIS — D869 Sarcoidosis, unspecified: Secondary | ICD-10-CM | POA: Diagnosis not present

## 2015-12-27 DIAGNOSIS — R278 Other lack of coordination: Secondary | ICD-10-CM | POA: Diagnosis not present

## 2015-12-27 DIAGNOSIS — M6281 Muscle weakness (generalized): Secondary | ICD-10-CM | POA: Diagnosis not present

## 2015-12-27 DIAGNOSIS — G9341 Metabolic encephalopathy: Secondary | ICD-10-CM | POA: Diagnosis not present

## 2015-12-27 DIAGNOSIS — E876 Hypokalemia: Secondary | ICD-10-CM | POA: Diagnosis not present

## 2015-12-27 DIAGNOSIS — F419 Anxiety disorder, unspecified: Secondary | ICD-10-CM | POA: Diagnosis not present

## 2015-12-27 LAB — BASIC METABOLIC PANEL
Anion gap: 8 (ref 5–15)
BUN: 34 mg/dL — AB (ref 6–20)
CALCIUM: 9.2 mg/dL (ref 8.9–10.3)
CHLORIDE: 97 mmol/L — AB (ref 101–111)
CO2: 30 mmol/L (ref 22–32)
CREATININE: 1.86 mg/dL — AB (ref 0.44–1.00)
GFR calc non Af Amer: 28 mL/min — ABNORMAL LOW (ref >60)
GFR, EST AFRICAN AMERICAN: 32 mL/min — AB (ref >60)
Glucose, Bld: 201 mg/dL — ABNORMAL HIGH (ref 65–99)
Potassium: 3.2 mmol/L — ABNORMAL LOW (ref 3.5–5.1)
SODIUM: 135 mmol/L (ref 135–145)

## 2015-12-27 MED ORDER — PREDNISONE 10 MG PO TABS: 10.0000 mg | ORAL_TABLET | Freq: Every day | ORAL | Status: DC

## 2015-12-27 MED ORDER — AMPHETAMINE-DEXTROAMPHET ER 30 MG PO CP24: 30.0000 mg | ORAL_CAPSULE | Freq: Every day | ORAL | Status: DC

## 2015-12-27 MED ORDER — TRAMADOL HCL 50 MG PO TABS: 50.0000 mg | ORAL_TABLET | Freq: Two times a day (BID) | ORAL | Status: DC | PRN

## 2015-12-27 MED ORDER — FUROSEMIDE 40 MG PO TABS: 40.0000 mg | ORAL_TABLET | Freq: Two times a day (BID) | ORAL | Status: DC

## 2015-12-27 MED ORDER — POTASSIUM CHLORIDE CRYS ER 20 MEQ PO TBCR: 40.0000 meq | EXTENDED_RELEASE_TABLET | Freq: Two times a day (BID) | ORAL | Status: DC

## 2015-12-27 NOTE — Clinical Social Work Placement (Signed)
   CLINICAL SOCIAL WORK PLACEMENT  NOTE  Date:  12/27/2015  Patient Details  Name: Tara [name] MRN: [id number] Date of Birth: Nov 08, 1950  Clinical Social Work is seeking post-discharge placement for this patient at the [place] level of care (*CSW will initial, date and re-position this form in  chart as items are completed):  Yes   Patient/family provided with [place] Work Department's list of facilities offering this level of care within the geographic area requested by the patient (or if unable, by the patient's family).  Yes   Patient/family informed of their freedom to choose among providers that offer the needed level of care, that participate in Medicare, Medicaid or managed care program needed by the patient, have an available bed and are willing to accept the patient.  Yes   Patient/family informed of [facility]'s ownership interest in [facility] and [facility], as well as of the fact that they are under no obligation to receive care at these facilities.  PASRR submitted to EDS on 12/13/15     PASRR number received on 12/13/15     Existing PASRR number confirmed on       FL2 transmitted to all facilities in geographic area requested by pt/family on 12/13/15     FL2 transmitted to all facilities within larger geographic area on       Patient informed that his/her managed care company has contracts with or will negotiate with certain facilities, including the following:        Yes   Patient/family informed of bed offers received.  Patient chooses bed at [facility]     Physician recommends and patient chooses bed at      Patient to be transferred to [facility] on 12/27/15.  Patient to be transferred to facility by [facility]     Patient family notified on 12/27/15 of transfer.  Name of family member notified:  Trish     PHYSICIAN       Additional Comment:     _______________________________________________ Benard Halsted, [place] 12/27/2015, 4:07 PM

## 2015-12-27 NOTE — Progress Notes (Signed)
Patient will DC to: [place] Anticipated DC date: 12/27/15 Family notified: Sister   Transport by: Corey Harold   Per MD patient ready for DC to [place]. RN, patient, patient's family, and facility notified of DC. RN given number for report. DC packet on chart. Ambulance transport requested for patient.   CSW signing off.  Cedric Fishman, [place] Social Worker [phone]

## 2015-12-27 NOTE — Progress Notes (Addendum)
Report given to receiving nurse at [facility]. All questions answered. Pt remains in stable condition. Pt will be d/c'd via [facility] to SNF. IV removed and paper gown placed on pt.

## 2015-12-27 NOTE — Discharge Summary (Signed)
Physician Discharge Summary  Katrenia Copenhaver [id number] DOB: 05-31-1951 DOA: 12/09/2015  PCP: Cathlean Cower, MD  Admit date: 12/09/2015 Discharge date: 12/27/2015  Time spent: 25 minutes  Recommendations for Outpatient Follow-up:  1. Follow up with PCP in 1 to 2 weeks.  2. Please check your renal function at your pcp office.  3. We have started you on steroids, with a slow taper, please follow up with your PCP regarding that.  4. followup with your nephrologist Dr Elmarie Shiley in 2 weeks. Please check renal function and calcium level.  5. Please follow up with psychiatry in one week.  6. Please check your bmp in two days to check your potassium level.  7. Please follow up with Dr Annamaria Boots her pulmonologist in one week.    Discharge Diagnoses:  Principal Problem:   Metabolic encephalopathy Active Problems:   Sarcoidosis ([place])   Essential hypertension   Hypercalcemia   Acute on chronic kidney failure (HCC)   UTI (urinary tract infection)   CKD (chronic kidney disease) stage 4, GFR 15-29 ml/min (HCC)   Hypokalemia   Elevated troponin   Acute kidney injury ([place])   Major depressive disorder, recurrent episode, mild ([place])   Discharge Condition: improved  Diet recommendation: low sodium diet.   Filed Weights   12/09/15 1825  Weight: 106.595 kg (235 lb)    History of present illness:  Tara [name] is a 65 y.o. female with a history of Sarcoidosis, HTN, CKD who did not show up for work today and her colleagues had [employer] go to her home and she was found to be severely confused.In the ED, patient was evaluated and a CT scan of the head, Chest x-ray all were unremarkable.BMET Hypercalcemia at 14.5, and Hypokalemia at 2.3, along with a elevated BUN/Cr Of 23/2.82. She also had an elevated Troponin level at 0.16.She was referred for admission.Patient was seen by nephrology, calcium has been normalized after aggressive IV fluid hydration, diaphoresis, pamidronate, Miacalcin. Nephrology has  signed off. She was also placed on prednisone and she will need to continue prednisone and outpatient follow-up with Dr. Annamaria Boots, her pulmonologist. Patient is being closely followed by psychiatry service, patient started on Seroquel, Ativan, venlafaxine, clorazepate.    Hospital Course:  Acute Metabolic encephalopathy  - Likely due to profound dehydration, hypercalcemia and uremia. Improving but still has tangential speech - CT head negative, ammonia level 17 - Patient was placed on aggressive IV fluid hydration, IV pamidronate 1 - Calcium has trended down, Now normalized. - MRI brain Negative for any acute CVA - Per psychiatry, patient was started on Adderall XR, venlafaxine XL, clorazepate, started on Seroquel on 6/1. Psych following.  - patient's B1, B12 levels normal.  - patient medically stable for discharge  Sarcoidosis [facility]) - Follows Dr. Annamaria Boots, last visit 04/2015, sarcoidosis diagnosed in 2009. Patient was hospitalized in 2014 with similar symptoms, hypercalcemia and renal insufficiency and was placed on prednisone with slow taper at this time - Continue prednisone  Uncontrolled hypertension - BP now better controlled, continue Lasix, atenolol, hydralazine, clonidine  Hypercalcemia: Due to underlying sarcoidosis, not on steroids prior to admission - Patient received aggressive IV fluid hydration, received IV pamidronate 1, continue miacalcin - Started prednisone - PTH appropriately low 13, vitamin D levels 1,25 58.6, PTH RP negative - ACE level high, 90 will need to stay on prednisone  Acute on chronic kidney failure ([place]): Due to profound dehydration and hypercalcemia, has underlying chronic kidney disease, stage IV - Creatinine stable, continue Lasix 40  mg twice a day   Escherichia coli UTI (urinary tract infection) - Urine culture showing Escherichia coli, s/p 8 days of Rocephin  Elevated troponin: Likely due to acute renal insufficiency and hypercalcemia, demand  ischemia.  - No chest pain or shortness of breath currently - 2-D echo showed EF of 60-65% with normal wall motion  Generalized debility - Patient will need inpatient psych at the time of discharge.  - Medically stable   Bipolar disorder: patient is on seroquel and venlafaxine as per psychiatry recommendations.   Procedures:  None  Consultations:  Psychiatry.  Nephrology  Discharge Exam: Filed Vitals:   12/27/15 0906 12/27/15 1342  BP: 114/47 142/68  Pulse: 77 64  Temp: 98.4 F (36.9 C) 98.4 F (36.9 C)  Resp: 18 20    General: alert comfortable. confused Cardiovascular: s1s2 Respiratory: ctab  Discharge Instructions   Discharge Instructions    Diet - low sodium heart healthy    Complete by:  As directed      Diet - low sodium heart healthy    Complete by:  As directed      Discharge instructions    Complete by:  As directed   Please follow up with your PCP IN 1 to 2 weeks, post hospitalization visit.  We have started you on prednisone for your sarcoidosis.     Discharge instructions    Complete by:  As directed   Please follow up with psychiatry in less than one week for medication management.  Please follow up with BMP in two days to check potassium  Please follow up with PCP in one week post hospitalization visit.          Current Discharge Medication List    START taking these medications   Details  amphetamine-dextroamphetamine (ADDERALL XR) 30 MG 24 hr capsule Take 1 capsule (30 mg total) by mouth daily. Refills: 0    cloNIDine (CATAPRES) 0.1 MG tablet Take 1 tablet (0.1 mg total) by mouth 2 (two) times daily. Qty: 60 tablet, Refills: 11    furosemide (LASIX) 40 MG tablet Take 1 tablet (40 mg total) by mouth 2 (two) times daily. Qty: 30 tablet, Refills: 0    hydrALAZINE (APRESOLINE) 100 MG tablet Take 1 tablet (100 mg total) by mouth every 8 (eight) hours.    predniSONE (DELTASONE) 10 MG tablet Take 1 tablet (10 mg total) by mouth daily  with breakfast. Qty: 3 tablet, Refills: 0    QUEtiapine (SEROQUEL XR) 50 MG TB24 24 hr tablet Take 2 tablets (100 mg total) by mouth at bedtime. Qty: 90 each    venlafaxine XR (EFFEXOR-XR) 75 MG 24 hr capsule Take 1 capsule (75 mg total) by mouth daily with breakfast.      CONTINUE these medications which have CHANGED   Details  clorazepate (TRANXENE) 15 MG tablet Take 1 tablet (15 mg total) by mouth at bedtime. Qty: 10 tablet, Refills: 0    traMADol (ULTRAM) 50 MG tablet Take 1 tablet (50 mg total) by mouth every 12 (twelve) hours as needed. Qty: 10 tablet, Refills: 0      CONTINUE these medications which have NOT CHANGED   Details  albuterol (PROVENTIL HFA;VENTOLIN HFA) 108 (90 Base) MCG/ACT inhaler Inhale 2 puffs into the lungs every 6 (six) hours as needed for wheezing or shortness of breath.    aspirin 81 MG tablet Take 81 mg by mouth daily.      atenolol (TENORMIN) 50 MG tablet TAKE 1 TABLET(50 MG) BY  MOUTH DAILY Qty: 90 tablet, Refills: 1    calcitonin, salmon, (MIACALCIN/FORTICAL) 200 UNIT/ACT nasal spray Place 1 spray into both nostrils daily.  Refills: 2    ferrous sulfate 325 (65 FE) MG tablet Take 325 mg by mouth daily with breakfast.    fluticasone (FLONASE) 50 MCG/ACT nasal spray Place 2 sprays into both nostrils daily.    glucosamine-chondroitin 500-400 MG tablet Take 1 tablet by mouth daily.     Multiple Vitamins-Minerals (MULTIVITAMIN WITH MINERALS) tablet Take 1 tablet by mouth daily.    DILT-XR 240 MG 24 hr capsule TAKE 1 CAPSULE BY MOUTH DAILY Qty: 90 capsule, Refills: 0      STOP taking these medications     amphetamine-dextroamphetamine (ADDERALL) 30 MG tablet      HYDROcodone-acetaminophen (NORCO/VICODIN) 5-325 MG tablet      PRISTIQ 100 MG 24 hr tablet      torsemide (DEMADEX) 20 MG tablet      zaleplon (SONATA) 10 MG capsule      diltiazem (CARTIA XT) 240 MG 24 hr capsule      valsartan (DIOVAN) 320 MG tablet        Allergies   Allergen Reactions  . Chocolate Other (See Comments)    SOMETIMES CAUSES SEVERE HEADACHES  . Floxin [Ocuflox]     shaky  . Fluoxetine Hcl     Suicidal thoughts  . Other Nausea And Vomiting    INSTANT ICED TEA PACKETS  . Sulfonamide Derivatives Rash   Follow-up Information    Follow up with Cathlean Cower, MD. Schedule an appointment as soon as possible for a visit in 1 week.   Specialties:  Internal Medicine, Radiology   Why:  post hospitalization visit.    Contact information:   [place]  [id number] [phone]        The results of significant diagnostics from this hospitalization (including imaging, microbiology, ancillary and laboratory) are listed below for reference.    Significant Diagnostic Studies: Dg Chest 2 View  12/21/2015  CLINICAL DATA:  Cough, confusion EXAM: CHEST  2 VIEW COMPARISON:  12/09/2015 FINDINGS: Cardiomediastinal silhouette is unremarkable. No acute infiltrate or pleural effusion. No pulmonary edema. Mild degenerative changes mid thoracic spine. IMPRESSION: No active cardiopulmonary disease. Electronically Signed   By: Lahoma Crocker M.D.   On: 12/21/2015 14:05   Dg Chest 2 View  12/09/2015  CLINICAL DATA:  Altered mental status. EXAM: CHEST  2 VIEW COMPARISON:  October 12, 2015. FINDINGS: The heart size and mediastinal contours are within normal limits. Both lungs are clear. No pneumothorax or pleural effusion is noted. The visualized skeletal structures are unremarkable. IMPRESSION: No active cardiopulmonary disease. Electronically Signed   By: Marijo Conception, M.D.   On: 12/09/2015 19:44   Ct Head Wo Contrast  12/09/2015  CLINICAL DATA:  Altered mental status. EXAM: CT HEAD WITHOUT CONTRAST TECHNIQUE: Contiguous axial images were obtained from the base of the skull through the vertex without intravenous contrast. COMPARISON:  CT scan of July 23, 2012. FINDINGS: Bony calvarium appears intact. No mass effect or midline shift is noted.  Ventricular size is within normal limits. There is no evidence of mass lesion, hemorrhage or acute infarction. IMPRESSION: Normal head CT. Electronically Signed   By: Marijo Conception, M.D.   On: 12/09/2015 19:55   Mr Brain Wo Contrast  12/12/2015  CLINICAL DATA:  65 year old hypertensive female with history of sarcoidosis, chronic kidney disease and migraines. Episode of slurred speech with  difficulty finding words. Confusion. Subsequent encounter. EXAM: MRI HEAD WITHOUT CONTRAST TECHNIQUE: Multiplanar, multiecho pulse sequences of the brain and surrounding structures were obtained without intravenous contrast. COMPARISON:  12/09/2015 CT.  05/08/2008 MR. FINDINGS: Exam is significantly motion degraded. No acute infarct or intracranial hemorrhage. Minimal nonspecific white matter changes may be related to small vessel disease or migraine headaches. Mild global atrophy without hydrocephalus. No intracranial mass lesion noted on this unenhanced exam. Major intracranial vascular structures are patent. No acute orbital abnormality noted. Cervical medullary junction appears intact. IMPRESSION: Significantly motion degraded exam without evidence of acute infarct. Electronically Signed   By: Genia Del M.D.   On: 12/12/2015 14:35    Microbiology: No results found for this or any previous visit (from the past 240 hour(s)).   Labs: Basic Metabolic Panel:  Recent Labs Lab 12/21/15 0507 12/22/15 1238 12/27/15 1028  NA 137 134* 135  K 3.7 4.3 3.2*  CL 105 101 97*  CO2 23 23 30   GLUCOSE 117* 273* 201*  BUN 35* 32* 34*  CREATININE 1.73* 1.70* 1.86*  CALCIUM 8.2* 8.6* 9.2   Liver Function Tests: No results for input(s): AST, ALT, ALKPHOS, BILITOT, PROT, ALBUMIN in the last 168 hours. No results for input(s): LIPASE, AMYLASE in the last 168 hours. No results for input(s): AMMONIA in the last 168 hours. CBC:  Recent Labs Lab 12/21/15 0507  WBC 10.7*  HGB 11.1*  HCT 35.6*  MCV 82.6  PLT 289    Cardiac Enzymes: No results for input(s): CKTOTAL, CKMB, CKMBINDEX, TROPONINI in the last 168 hours. BNP: BNP (last 3 results) No results for input(s): BNP in the last 8760 hours.  ProBNP (last 3 results) No results for input(s): PROBNP in the last 8760 hours.  CBG: No results for input(s): GLUCAP in the last 168 hours.     SignedHosie Poisson MD.  [facility] 12/27/2015, 3:47 PM

## 2015-12-27 NOTE — Progress Notes (Signed)
   12/27/15 1200  Clinical Encounter Type  Visited With Patient  Visit Type Initial  Spiritual Encounters  Spiritual Needs Emotional  Stress Factors  Patient Stress Factors Health changes;Loss of control  Family Stress Factors Not reviewed   Chaplain visited with pt. During conversation, Chaplain noticed that the pt was confused and not aware.  Chaplain offered ministry of presence to pt and listened to her for awhile.    Tara [name] 12/27/2015 12:18 PM [id number]

## 2015-12-28 ENCOUNTER — Telehealth: Payer: Self-pay | Admitting: Internal Medicine

## 2015-12-28 NOTE — Telephone Encounter (Signed)
Pt request speak to the assistant, she need more information and what to do to get Dr. [name] to coordinate her care since she is in the rehabilitation right now (just got out of the hospital).

## 2015-12-29 NOTE — Telephone Encounter (Signed)
Unable to reach patient, and also unable to leave voicemail due to inbox being full.

## 2015-12-30 DIAGNOSIS — N179 Acute kidney failure, unspecified: Secondary | ICD-10-CM | POA: Diagnosis not present

## 2015-12-30 DIAGNOSIS — N184 Chronic kidney disease, stage 4 (severe): Secondary | ICD-10-CM | POA: Diagnosis not present

## 2015-12-30 DIAGNOSIS — D869 Sarcoidosis, unspecified: Secondary | ICD-10-CM | POA: Diagnosis not present

## 2016-01-02 ENCOUNTER — Telehealth: Payer: Self-pay | Admitting: Internal Medicine

## 2016-01-02 DIAGNOSIS — G3184 Mild cognitive impairment, so stated: Secondary | ICD-10-CM | POA: Diagnosis not present

## 2016-01-02 DIAGNOSIS — N189 Chronic kidney disease, unspecified: Secondary | ICD-10-CM | POA: Diagnosis not present

## 2016-01-02 DIAGNOSIS — I10 Essential (primary) hypertension: Secondary | ICD-10-CM | POA: Diagnosis not present

## 2016-01-02 DIAGNOSIS — F333 Major depressive disorder, recurrent, severe with psychotic symptoms: Secondary | ICD-10-CM | POA: Diagnosis not present

## 2016-01-02 NOTE — Telephone Encounter (Signed)
Yes this is fine. Thanks.

## 2016-01-03 ENCOUNTER — Telehealth: Payer: Self-pay | Admitting: Family Medicine

## 2016-01-03 NOTE — Telephone Encounter (Signed)
Patient states she is in a rehabilitation facility.  Patient does not want facility to coordinate issues in regard to no slip socks and braces for her knee.  She is requesting a call back in regard.

## 2016-01-03 NOTE — Telephone Encounter (Signed)
HFU scheduled for 01/04/16 @ 11:30 with Eric Form Nothing further needed.

## 2016-01-04 ENCOUNTER — Inpatient Hospital Stay: Payer: Self-pay | Admitting: Acute Care

## 2016-01-04 NOTE — Telephone Encounter (Signed)
Called pt, vmail box is full. Unable to leave a msg.

## 2016-01-05 ENCOUNTER — Inpatient Hospital Stay: Payer: Self-pay | Admitting: Internal Medicine

## 2016-01-13 ENCOUNTER — Telehealth: Payer: Self-pay

## 2016-01-13 NOTE — Telephone Encounter (Signed)
Pt lm on triage wanting to know about her medications. She stated that it changed after she got out of the hospital and she states that there is some BP meds not listed on medication list any more.   Tried to call pt back to got over the discharge instructions and determine if there were any additional information needed from PCP.   Not able to lm - pt mailbox is full.

## 2016-01-18 ENCOUNTER — Ambulatory Visit (INDEPENDENT_AMBULATORY_CARE_PROVIDER_SITE_OTHER): Payer: BLUE CROSS/BLUE SHIELD | Admitting: Internal Medicine

## 2016-01-18 ENCOUNTER — Telehealth: Payer: Self-pay | Admitting: Internal Medicine

## 2016-01-18 ENCOUNTER — Encounter: Payer: Self-pay | Admitting: Internal Medicine

## 2016-01-18 ENCOUNTER — Other Ambulatory Visit (INDEPENDENT_AMBULATORY_CARE_PROVIDER_SITE_OTHER): Payer: BLUE CROSS/BLUE SHIELD

## 2016-01-18 VITALS — BP 130/70 | HR 67 | Temp 98.1°F | Resp 20 | Wt 223.0 lb

## 2016-01-18 DIAGNOSIS — D869 Sarcoidosis, unspecified: Secondary | ICD-10-CM

## 2016-01-18 DIAGNOSIS — I10 Essential (primary) hypertension: Secondary | ICD-10-CM

## 2016-01-18 DIAGNOSIS — F330 Major depressive disorder, recurrent, mild: Secondary | ICD-10-CM

## 2016-01-18 DIAGNOSIS — N179 Acute kidney failure, unspecified: Secondary | ICD-10-CM | POA: Diagnosis not present

## 2016-01-18 DIAGNOSIS — N189 Chronic kidney disease, unspecified: Secondary | ICD-10-CM

## 2016-01-18 LAB — BASIC METABOLIC PANEL
BUN: 13 mg/dL (ref 6–23)
CALCIUM: 9.9 mg/dL (ref 8.4–10.5)
CO2: 34 mEq/L — ABNORMAL HIGH (ref 19–32)
CREATININE: 1.44 mg/dL — AB (ref 0.40–1.20)
Chloride: 98 mEq/L (ref 96–112)
GFR: 39.06 mL/min — AB (ref >60.00)
GLUCOSE: 113 mg/dL — AB (ref 70–99)
Potassium: 3.9 mEq/L (ref 3.5–5.1)
SODIUM: 135 meq/L (ref 135–145)

## 2016-01-18 MED ORDER — ATENOLOL 50 MG PO TABS: ORAL_TABLET | ORAL | Status: DC

## 2016-01-18 MED ORDER — DILTIAZEM HCL ER 240 MG PO CP24: 240.0000 mg | ORAL_CAPSULE | Freq: Every day | ORAL | Status: DC

## 2016-01-18 MED ORDER — ALBUTEROL SULFATE HFA 108 (90 BASE) MCG/ACT IN AERS: 2.0000 | INHALATION_SPRAY | Freq: Four times a day (QID) | RESPIRATORY_TRACT | Status: DC | PRN

## 2016-01-18 NOTE — Patient Instructions (Addendum)
OK to stop the valsartan for now due to the recent kidney issues  OK to stay off of the seroquel as you have not been taking.  We can hold on further steroid for now    Please continue all other medications as before, and refills have been done if requested - the atenolol, diltiazem, and the inhaler  Please have the pharmacy call with any other refills you may need.  Please keep your appointments with your specialists as you may have planned, but please make appt with Dr Annamaria Boots in 1 week , also see Dr Posey Pronto as planned.   Please go to the LAB in the Basement (turn left off the elevator) for the tests to be done today  You will be contacted by phone if any changes need to be made immediately.  Otherwise, you will receive a letter about your results with an explanation, but please check with MyChart first.  Please remember to sign up for MyChart if you have not done so, as this will be important to you in the future with finding out test results, communicating by private email, and scheduling acute appointments online when needed.  Please return in 3 months, or sooner if needed

## 2016-01-18 NOTE — Progress Notes (Signed)
Subjective:    Patient ID: Tara [name], female    DOB: [birth year], 65 y.o.   MRN: [id number]  HPI  Here to f/u recent hypcalcemia related to sarcoid not on steroid at the time, along with acute TME/confusion, AKI likely volume and UTI related, and uncontrolled HTN. Last calcium: Lab Results  Component Value Date   CALCIUM 9.2 12/27/2015   PHOS 2.9 12/14/2015   BP Readings from Last 3 Encounters:  01/18/16 130/70  12/27/15 145/68  11/18/15 144/90  D/c med list stated prednisone 10 qd for 3 days, but she's not sure if she took this as the rehab since she still had some confusion at time of d/c to rehab, now fortunately resolved.  Seen per telepsych/ Dr Toy Care last Friday at home, and pt has been contd on her meds, though she will need to look for the adderall rx in the mail (coming), and asked to change the effexor back to the Pristiq., and tranxene changed fro qid to tid.  Was rec'd to see Dr Annamaria Boots at one wk from June 13 d/c, but pt did not realize this. Does have appt with him Oct 19, and also has f/u appt with Dr Patel/renal for July 24.  Pt due for f/u labs today.  Also still some confused about her meds as several were changed.  Not taking the seroquel as she was not sure what it was for.  On multiple meds for BP, but as  Above was not sure of a few things, and did go back to taking her previous valsartan as she was fearful of increased BP again.   Past Medical History  Diagnosis Date  . ANKLE PAIN, LEFT 04/01/2008  . ANXIETY 04/17/2007  . COLONIC POLYPS, HX OF 08/01/2007  . CONTUSIONS, MULTIPLE 04/01/2009  . DEPRESSION 04/17/2007  . DIZZINESS 08/01/2007  . DYSPNEA 08/01/2007  . Enlargement of lymph nodes 08/13/2007  . GLUCOSE INTOLERANCE 08/01/2007  . HYPERLIPIDEMIA 08/01/2007  . HYPERSOMNIA 07/28/2008  . HYPERTENSION 04/17/2007  . JOINT EFFUSION, LEFT KNEE 06/02/2010  . Loose body in knee 04/01/2009  . Morbid obesity ([place]) 04/20/2007  . OTHER DISEASES OF LUNG NOT ELSEWHERE CLASSIFIED  08/01/2007  . Pain in joint, lower leg 04/01/2009  . PERIPHERAL EDEMA 04/21/2009  . Sarcoidosis ([place]) 09/25/2007  . SHOULDER PAIN, LEFT 04/01/2009  . Impaired glucose tolerance 03/23/2011  . Migraines     "stopped 3-4 yr ago" (07/23/2012)  . 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 Chronic kidney disease    Past Surgical History  Procedure Laterality Date  . Myomectomy  1994  . Fracture surgery  ?02/1997    "left upper arm; put rod in" (07/23/2012)  . Refractive surgery  08/1998    "both eyes" (07/23/2012)  . Combined mediastinoscopy and bronchoscopy  08/2007  . Gum surgery  2000-?2009    "several ORs; soft tissue graft; took material from roof of mouth" (07/23/2012  . Knee arthroscopy  03/2004; 06/2009    "right; left" Dr. Theda Sers  . Lymph node biopsy  ~ 2009    "for sarcoidosis; don't know exactly which nodes" (07/23/2102)  . Combined mediastinoscopy and bronchoscopy  2009  . Breast surgery      Biopsy benign    reports that she has never smoked. She has never used smokeless tobacco. She reports that she drinks about 4.2 oz of alcohol per week. She reports that she does not use illicit drugs. family history includes Asthma in her sister; Diabetes in her mother; Heart  disease (age of onset: 54) in her father; Hypertension in her father, mother, and sister; Stroke in her mother. There is no history of Colon cancer. Allergies  Allergen Reactions  . Chocolate Other (See Comments)    SOMETIMES CAUSES SEVERE HEADACHES  . Floxin [Ocuflox]     shaky  . Fluoxetine Hcl     Suicidal thoughts  . Other Nausea And Vomiting    INSTANT ICED TEA PACKETS  . Sulfonamide Derivatives Rash   Review of Systems  Constitutional: Negative for unusual diaphoresis or night sweats HENT: Negative for ear swelling or discharge Eyes: Negative for worsening visual haziness  Respiratory: Negative for choking and stridor.   Gastrointestinal: Negative for distension or worsening eructation Genitourinary: Negative for  retention or change in urine volume.  Musculoskeletal: Negative for other MSK pain or swelling Skin: Negative for color change and worsening wound Neurological: Negative for tremors and numbness other than noted  Psychiatric/Behavioral: Negative for decreased concentration or agitation other than above       Objective:   Physical Exam BP 130/70 mmHg  Pulse 67  Temp(Src) 98.1 F (36.7 C) (Oral)  Resp 20  Wt 223 lb (101.152 kg)  SpO2 98% VS noted,  Constitutional: Pt appears in no apparent distress HENT: Head: NCAT.  Right Ear: External ear normal.  Left Ear: External ear normal.  Eyes: . Pupils are equal, round, and reactive to light. Conjunctivae and EOM are normal Neck: Normal range of motion. Neck supple.  Cardiovascular: Normal rate and regular rhythm.   Pulmonary/Chest: Effort normal and breath sounds without rales or wheezing.  Abd:  Soft, NT, ND, + BS Neurological: Pt is alert. Not confused , motor grossly intact Skin: Skin is warm. No rash, no LE edema Psychiatric: Pt behavior is normal. No agitation.     Assessment & Plan:

## 2016-01-18 NOTE — Telephone Encounter (Signed)
Per hospital d/c notes pt needs to be seen by CY within a week of d/c, which was 12/27/15. Pt called today to schedule OV, but CY's schedule is full and there are no NP openings.   CY/Katie -  Please advise as to when this pt can be seen. Thanks!

## 2016-01-18 NOTE — Progress Notes (Signed)
Pre visit review using our clinic review tool, if applicable. No additional management support is needed unless otherwise documented below in the visit note. 

## 2016-01-18 NOTE — Telephone Encounter (Signed)
Pt can be seen by NP. Thanks.

## 2016-01-19 NOTE — Telephone Encounter (Signed)
Attempted to contact pt. No answer, no option to leave a message. Will try back.  

## 2016-01-19 NOTE — Telephone Encounter (Signed)
Made the patient an appt on 01/25/16 with Judson Roch

## 2016-01-19 NOTE — Telephone Encounter (Signed)
ATC pt. Phone would ring then someone would pick up but not speak. WCB.

## 2016-01-23 NOTE — Assessment & Plan Note (Signed)
stable overall by history and exam, recent data reviewed with pt, and pt to continue medical treatment as before,  to f/u any worsening symptoms or concerns . Lab Results  Component Value Date   WBC 10.7* 12/21/2015   HGB 11.1* 12/21/2015   HCT 35.6* 12/21/2015   PLT 289 12/21/2015   GLUCOSE 113* 01/18/2016   CHOL 148 09/27/2015   TRIG 255.0* 09/27/2015   HDL 38.60* 09/27/2015   LDLDIRECT 42.0 09/27/2015   LDLCALC 133* 09/22/2013   ALT 27 12/09/2015   AST 34 12/09/2015   NA 135 01/18/2016   K 3.9 01/18/2016   CL 98 01/18/2016   CREATININE 1.44* 01/18/2016   BUN 13 01/18/2016   CO2 34* 01/18/2016   TSH 4.72* 09/27/2015   INR 0.9 09/04/2007   HGBA1C 6.6* 09/27/2015   MICROALBUR 1.8 09/27/2015

## 2016-01-23 NOTE — Assessment & Plan Note (Signed)
With recent exacerbation, for f/u BMP today, cont same tx for now pending results

## 2016-01-23 NOTE — Assessment & Plan Note (Signed)
Will hold on further steroid at this time, f/u pulm as planned

## 2016-01-23 NOTE — Assessment & Plan Note (Signed)
Ok to stop the valsartan she has taken on her own at home due to recent AKI, cont monitor BP at home and next visit BP Readings from Last 3 Encounters:  01/18/16 130/70  12/27/15 145/68  11/18/15 144/90

## 2016-01-24 ENCOUNTER — Other Ambulatory Visit: Payer: Self-pay | Admitting: Family Medicine

## 2016-01-25 ENCOUNTER — Encounter: Payer: Self-pay | Admitting: Acute Care

## 2016-01-25 ENCOUNTER — Telehealth: Payer: Self-pay | Admitting: Internal Medicine

## 2016-01-25 ENCOUNTER — Other Ambulatory Visit: Payer: BLUE CROSS/BLUE SHIELD

## 2016-01-25 ENCOUNTER — Ambulatory Visit (INDEPENDENT_AMBULATORY_CARE_PROVIDER_SITE_OTHER): Payer: BLUE CROSS/BLUE SHIELD | Admitting: Acute Care

## 2016-01-25 DIAGNOSIS — D869 Sarcoidosis, unspecified: Secondary | ICD-10-CM | POA: Diagnosis not present

## 2016-01-25 NOTE — Telephone Encounter (Signed)
Please advise 

## 2016-01-25 NOTE — Patient Instructions (Addendum)
It is nice to meet you today. We will check some labs today.( ACE Level) We will call you with results. Continue taking your Flonase for allergies. Follow up with Dr. Annamaria Boots in 1 month to recheck labs at that time to determine if you need to be placed on prednisone Please contact office for sooner follow up if symptoms do not improve or worsen or seek emergency care

## 2016-01-25 NOTE — Telephone Encounter (Signed)
Patient states she was reviewing her medication list and seen clonidine, lasix and hydralazine on the list.  Patient states she does not take these medications.  She would like to know if she needs to be taking them and if so she will need scripts sent to [facility] at [address].

## 2016-01-25 NOTE — Progress Notes (Addendum)
History of Present Illness Tara [name] is a 65 y.o. female never smoker with sarcoid diagnosed in 2009 by  mediastinoscopy and bronchoscopy, with pulmonary and renal involvement.    7/12/2017Hospital Follow up:  Pt. Presents to the office for hospital follow up. She was admitted to the hospital for confusion and hypercalcemia from 12/09/15-12/27/2015. She was treated with aggressive IV fluid hydration, diaphoresis, pamidronate, and Miacalcin. She was told to follow up with Pulmonary in 1 week in her discharge paperwork, but did not realize that she needed to do this. She comes in today for her follow up one month after discharge.. She states she is better. She was placed on prednisone in the hospital, with a slow taper  and was told to follow up with pulmonary regarding further prednisone use. Her confusion is resolved. Her hypercalcemia is resolved.She did have her follow up appointment with Dr. [name] today, but did not ask for the  refills she needed for several of her medications. She is going to stop by his office on the way out today for those refills. She stated that she has not been taking here lasix, as she had run out of the medication. Last calcium checked 01/18/16 by Dr. [name] was 9.9 mg/dL.I spoke with Dr. Annamaria Boots about this patient as he knows her well. She has been prednisone dependent in the past, but has not been on chronic  prednisone since 12/2013 due to worsening renal function. He feels the best plan at this point, as her calcium is currently WNL,  is to check ACE levels and base further prednisone dosage on the result. She will follow up with Dr. Annamaria Boots in 1 month. She states she has rarely needed to use her rescue inhaler for dyspnea. She is compliant with her Flonase use for allergies.We reviewed her medication list in detail.She has not been taking her Lasix or anti hypertensive medications.  Significant Events:   Admit date: 12/09/2015 Discharge date: 12/27/2015  Time spent: 25  minutes  Recommendations for Outpatient Follow-up:  1. Follow up with PCP in 1 to 2 weeks.  2. Please check your renal function at your pcp office.  3. We have started you on steroids, with a slow taper, please follow up with your PCP regarding that.  4. followup with your nephrologist Dr Elmarie Shiley in 2 weeks. Please check renal function and calcium level.  5. Please follow up with psychiatry in one week.  6. Please check your bmp in two days to check your potassium level.  7. Please follow up with Dr Annamaria Boots her pulmonologist in one week.   Discharge Diagnoses:  Principal Problem:  Metabolic encephalopathy Active Problems:  Sarcoidosis ([place])  Essential hypertension  Hypercalcemia  Acute on chronic kidney failure (HCC)  UTI (urinary tract infection)  CKD (chronic kidney disease) stage 4, GFR 15-29 ml/min (HCC)  Hypokalemia  Elevated troponin  Acute kidney injury ([place])  Major depressive disorder, recurrent episode, mild ([place])   Discharge Condition: improved  Diet recommendation: low sodium diet.   Tests  CXR 12/21/2015  FINDINGS: Cardiomediastinal silhouette is unremarkable. No acute infiltrate or pleural effusion. No pulmonary edema. Mild degenerative changes mid thoracic spine.  IMPRESSION: No active cardiopulmonary disease  Echo 12/10/2015  Study Conclusions  - Left ventricle: The cavity size was normal. Wall thickness was  increased in a pattern of mild LVH. Systolic function was normal.  The estimated ejection fraction was in the range of 60% to 65%.  Wall motion was normal; there were no  regional wall motion  abnormalities. Left ventricular diastolic function parameters  were normal. - Left atrium: The atrium was moderately dilated. - Atrial septum: No defect or patent foramen ovale was identified.  Past medical hx Past Medical History  Diagnosis Date  . ANKLE PAIN, LEFT 04/01/2008  . ANXIETY 04/17/2007  . COLONIC POLYPS, HX OF  08/01/2007  . CONTUSIONS, MULTIPLE 04/01/2009  . DEPRESSION 04/17/2007  . DIZZINESS 08/01/2007  . DYSPNEA 08/01/2007  . Enlargement of lymph nodes 08/13/2007  . GLUCOSE INTOLERANCE 08/01/2007  . HYPERLIPIDEMIA 08/01/2007  . HYPERSOMNIA 07/28/2008  . HYPERTENSION 04/17/2007  . JOINT EFFUSION, LEFT KNEE 06/02/2010  . Loose body in knee 04/01/2009  . Morbid obesity ([place]) 04/20/2007  . OTHER DISEASES OF LUNG NOT ELSEWHERE CLASSIFIED 08/01/2007  . Pain in joint, lower leg 04/01/2009  . PERIPHERAL EDEMA 04/21/2009  . Sarcoidosis ([place]) 09/25/2007  . SHOULDER PAIN, LEFT 04/01/2009  . Impaired glucose tolerance 03/23/2011  . Migraines     "stopped 3-4 yr ago" (07/23/2012)  . 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 Chronic kidney disease      Past surgical hx, Family hx, Social hx all reviewed.  Current Outpatient Prescriptions on File Prior to Visit  Medication Sig  . albuterol (PROVENTIL HFA;VENTOLIN HFA) 108 (90 Base) MCG/ACT inhaler Inhale 2 puffs into the lungs every 6 (six) hours as needed for wheezing or shortness of breath.  . amphetamine-dextroamphetamine (ADDERALL XR) 30 MG 24 hr capsule Take 1 capsule (30 mg total) by mouth daily.  Marland Kitchen aspirin 81 MG tablet Take 81 mg by mouth daily.    Marland Kitchen atenolol (TENORMIN) 50 MG tablet TAKE 1 TABLET(50 MG) BY MOUTH DAILY  . calcitonin, salmon, (MIACALCIN/FORTICAL) 200 UNIT/ACT nasal spray Place 1 spray into both nostrils daily.   . clorazepate (TRANXENE) 15 MG tablet Take 1 tablet (15 mg total) by mouth at bedtime.  Marland Kitchen diltiazem (DILT-XR) 240 MG 24 hr capsule Take 1 capsule (240 mg total) by mouth daily.  . ferrous sulfate 325 (65 FE) MG tablet Take 325 mg by mouth daily with breakfast.  . fluticasone (FLONASE) 50 MCG/ACT nasal spray Place 2 sprays into both nostrils daily.  . furosemide (LASIX) 40 MG tablet Take 1 tablet (40 mg total) by mouth 2 (two) times daily.  Marland Kitchen glucosamine-chondroitin 500-400 MG tablet Take 1 tablet by mouth daily.   . Multiple  Vitamins-Minerals (MULTIVITAMIN WITH MINERALS) tablet Take 1 tablet by mouth daily.  . traMADol (ULTRAM) 50 MG tablet TAKE 1 TABLET BY MOUTH EVERY 12 HOURS AS NEEDED  . venlafaxine XR (EFFEXOR-XR) 75 MG 24 hr capsule Take 1 capsule (75 mg total) by mouth daily with breakfast.  . cloNIDine (CATAPRES) 0.1 MG tablet Take 1 tablet (0.1 mg total) by mouth 2 (two) times daily. (Patient not taking: Reported on 01/25/2016)  . hydrALAZINE (APRESOLINE) 100 MG tablet Take 1 tablet (100 mg total) by mouth every 8 (eight) hours. (Patient not taking: Reported on 01/25/2016)   No current facility-administered medications on file prior to visit.     Allergies  Allergen Reactions  . Chocolate Other (See Comments)    SOMETIMES CAUSES SEVERE HEADACHES  . Floxin [Ocuflox]     shaky  . Fluoxetine Hcl     Suicidal thoughts  . Other Nausea And Vomiting    INSTANT ICED TEA PACKETS  . Sulfonamide Derivatives Rash    Review Of Systems:  Constitutional:   No  weight loss, night sweats,  Fevers, chills, fatigue, or  lassitude.  HEENT:  No headaches,  Difficulty swallowing,  Tooth/dental problems, or  Sore throat,                No sneezing, itching, ear ache, nasal congestion,+ post nasal drip,   CV:  No chest pain,  Orthopnea, PND, swelling in lower extremities, anasarca, dizziness, palpitations, syncope.   GI  No heartburn, indigestion, abdominal pain, nausea, vomiting, diarrhea, change in bowel habits, loss of appetite, bloody stools.   Resp: No shortness of breath with exertion or at rest.  No excess mucus, no productive cough,  No non-productive cough,  No coughing up of blood.  No change in color of mucus.  No wheezing.  No chest wall deformity  Skin: no rash or lesions.  GU: no dysuria, change in color of urine, no urgency or frequency.  No flank pain, no hematuria   MS:  No joint pain or swelling.  No decreased range of motion.  No back pain.  Psych:  No change in mood or affect. No depression or  anxiety.  No memory loss.   Vital Signs BP 128/84 mmHg  Pulse 70  Ht 5\' 6"  (1.676 m)  Wt 223 lb 3.2 oz (101.243 kg)  BMI 36.04 kg/m2  SpO2 100%   Physical Exam:  General- No distress,  A&Ox3, flat affect ENT: No sinus tenderness, TM clear, pale nasal mucosa, no oral exudate,no post nasal drip, no LAN Cardiac: S1, S2, regular rate and rhythm, no murmur Chest: No wheeze/ rales/ dullness; no accessory muscle use, no nasal flaring, no sternal retractions Abd.: Soft Non-tender Ext: No clubbing cyanosis, edema Neuro:  normal strength Skin: No rashes, warm and dry Psych: flat affect   Assessment/Plan  Sarcoidosis (HCC) Relapse with hypercalcemia and confusion, renal insufficiency requiring hospitalization. Calcium normalized, renal function to baseline Plan: We will check some labs today.( ACE Level) We will call you with results. If ACE level elavated we will treat with prednisone. Continue taking your Flonase for allergies. Follow up with Dr. Annamaria Boots in 1 month to recheck labs at that time to determine if you need to be placed on prednisone Please contact office for sooner follow up if symptoms do not improve or worsen or seek emergency care       Magdalen Spatz, NP 01/25/2016  8:57 PM

## 2016-01-25 NOTE — Telephone Encounter (Signed)
Ok to not take those meds at this time  OK to stay off the seroquel

## 2016-01-25 NOTE — Telephone Encounter (Signed)
Patient advised.

## 2016-01-25 NOTE — Telephone Encounter (Signed)
Patient also would like to double check with Dr. [name] to make sure she does not need to be taking seroquel.  Please follow up in regards.

## 2016-01-25 NOTE — Assessment & Plan Note (Signed)
Relapse with hypercalcemia and confusion, renal insufficiency requiring hospitalization. Calcium normalized, renal function to baseline Plan: We will check some labs today.( ACE Level) We will call you with results. If ACE level elavated we will treat with prednisone. Continue taking your Flonase for allergies. Follow up with Dr. Annamaria Boots in 1 month to recheck labs at that time to determine if you need to be placed on prednisone Please contact office for sooner follow up if symptoms do not improve or worsen or seek emergency care

## 2016-01-25 NOTE — Telephone Encounter (Signed)
Refill done.  

## 2016-01-26 LAB — ANGIOTENSIN CONVERTING ENZYME: ANGIOTENSIN-CONVERTING ENZYME: 74 U/L — AB (ref 8–52)

## 2016-01-27 ENCOUNTER — Ambulatory Visit (INDEPENDENT_AMBULATORY_CARE_PROVIDER_SITE_OTHER): Payer: BLUE CROSS/BLUE SHIELD | Admitting: Family Medicine

## 2016-01-27 ENCOUNTER — Encounter: Payer: Self-pay | Admitting: Family Medicine

## 2016-01-27 VITALS — BP 126/84 | HR 72 | Wt 214.0 lb

## 2016-01-27 DIAGNOSIS — M170 Bilateral primary osteoarthritis of knee: Secondary | ICD-10-CM | POA: Insufficient documentation

## 2016-01-27 MED ORDER — TRAMADOL HCL 50 MG PO TABS: 50.0000 mg | ORAL_TABLET | Freq: Two times a day (BID) | ORAL | Status: DC | PRN

## 2016-01-27 NOTE — Patient Instructions (Signed)
God to see you  Please only use the tramadol when in severe pain  Try to do the exercises 2-3 times a week I do feel the weight loss should help a lot. Keep it up New shoes 8.5 size and 2E width could be great  Good shoes with rigid bottom.  Jalene Mullet, Merrell or New balance greater then [employer] is still a good idea after activity  See me again if it gets worse.

## 2016-01-27 NOTE — Assessment & Plan Note (Signed)
Degenerative knees bilaterally. We discussed icing regimen, patient work with Product/process development scientist to learn home exercises in greater detail, patient declined formal physical therapy. We discussed proper shoes and how this will be beneficial as well. Discussed icing regimen. We will look into possible custom bracing again for patient. Patient knows that there is severe osteophytic changes and can follow-up more on an as-needed basis knowing that we would either consider injections or possible referral for replacement.  Spent  25 minutes with patient face-to-face and had greater than 50% of counseling including as described above in assessment and plan.

## 2016-01-27 NOTE — Progress Notes (Signed)
[name] [place], [place] [address] Phone: [phone] Subjective:    I'm seeing this patient by the request  of:  Cathlean Cower, MD   CC: Left knee follow-up  [id number] Tara [name] is a 65 y.o. female coming in with complaint of  Bilateral knee pain.  Patient has been found to have severe arthritic changes of the knees bilaterally left greater than right. Patient recently did have a flare of her sarcoidosis and hypercalcemia. Patient was in rehabilitation. Patient states she has lost weight since then. States that she has been more active but since she has been more active she is noticing mild increase in pain in the knees. We discussed icing regimen which she has been doing occasionally. Feels that she can do more activity but she does not remember the exercises that have been given to her. Does not want to go to formal physical therapy. Still has some instability in his stating that she does not have the custom brace made for her.  Past Medical History  Diagnosis Date  . ANKLE PAIN, LEFT 04/01/2008  . ANXIETY 04/17/2007  . COLONIC POLYPS, HX OF 08/01/2007  . CONTUSIONS, MULTIPLE 04/01/2009  . DEPRESSION 04/17/2007  . DIZZINESS 08/01/2007  . DYSPNEA 08/01/2007  . Enlargement of lymph nodes 08/13/2007  . GLUCOSE INTOLERANCE 08/01/2007  . HYPERLIPIDEMIA 08/01/2007  . HYPERSOMNIA 07/28/2008  . HYPERTENSION 04/17/2007  . JOINT EFFUSION, LEFT KNEE 06/02/2010  . Loose body in knee 04/01/2009  . Morbid obesity ([place]) 04/20/2007  . OTHER DISEASES OF LUNG NOT ELSEWHERE CLASSIFIED 08/01/2007  . Pain in joint, lower leg 04/01/2009  . PERIPHERAL EDEMA 04/21/2009  . Sarcoidosis ([place]) 09/25/2007  . SHOULDER PAIN, LEFT 04/01/2009  . Impaired glucose tolerance 03/23/2011  . Migraines     "stopped 3-4 yr ago" (07/23/2012)  . 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 Chronic kidney disease    Past Surgical History  Procedure Laterality Date  . Myomectomy  1994    . Fracture surgery  ?02/1997    "left upper arm; put rod in" (07/23/2012)  . Refractive surgery  08/1998    "both eyes" (07/23/2012)  . Combined mediastinoscopy and bronchoscopy  08/2007  . Gum surgery  2000-?2009    "several ORs; soft tissue graft; took material from roof of mouth" (07/23/2012  . Knee arthroscopy  03/2004; 06/2009    "right; left" Dr. Theda Sers  . Lymph node biopsy  ~ 2009    "for sarcoidosis; don't know exactly which nodes" (07/23/2102)  . Combined mediastinoscopy and bronchoscopy  2009  . Breast surgery      Biopsy benign   Social History  Substance Use Topics  . Smoking status: Never Smoker   . Smokeless tobacco: Never Used  . Alcohol Use: 4.2 oz/week    7 Standard drinks or equivalent per week   Allergies  Allergen Reactions  . Chocolate Other (See Comments)    SOMETIMES CAUSES SEVERE HEADACHES  . Floxin [Ocuflox]     shaky  . Fluoxetine Hcl     Suicidal thoughts  . Other Nausea And Vomiting    INSTANT ICED TEA PACKETS  . Sulfonamide Derivatives Rash   Family History  Problem Relation Age of Onset  . Heart disease Father 35  . Hypertension Father   . Diabetes Mother   . Hypertension Mother   . Stroke Mother   . Hypertension Sister   . Asthma Sister   . Colon cancer Neg Hx  Past medical history, social, surgical and family history all reviewed in electronic medical record.   Review of Systems: No headache, visual changes, nausea, vomiting, diarrhea, constipation, dizziness, abdominal pain, skin rash, fevers, chills, night sweats, weight loss, swollen lymph nodes, body aches, joint swelling, muscle aches, chest pain, shortness of breath, mood changes.   Objective Blood pressure 126/84, pulse 72, weight 214 lb (97.07 kg).  General: No apparent distress alert and oriented x3 mood and affect normal, dressed appropriately. Has lost weight from previous exam HEENT: Pupils equal, extraocular movements intact and blinks a lot likely nervous  twitch Respiratory: Patient's speak in full sentences and does not appear short of breath  Cardiovascular: No lower extremity edema, non tender, no erythema  Skin: Warm dry intact with no signs of infection or rash on extremities or on axial skeleton.  Abdomen: Soft nontender  Neuro: Cranial nerves II through XII are intact, neurovascularly intact in all extremities with 2+ DTRs and 2+ pulses.  Lymph: No lymphadenopathy of posterior or anterior cervical chain or axillae bilaterally.  Gait normal with good balance and coordination.  MSK:  Non tender with full range of motion and good stability and symmetric strength and tone of shoulders, elbows, wrist, hip, bilaterally.    Bilateral Knee exam shows the patient does have varus deformity of the knee. Significant instability even with walking. Antalgic gait noted but mild improvement. Severe pain over the medial joint line on the left side Contralateral knee has arthritic changes noted as well. Mild pain. Patient though on of the medius easier to C7 she has lost weight.  Procedure note [id number]; 15 minutes spent for Therapeutic exercises as stated in above notes.  This included exercises focusing on stretching, strengthening, with significant focus on eccentric aspects.  Patient in flexion and extension exercises working on the vastus medialis oblique, hip abductor's as well as stretching of the hamstrings. Proper technique shown and discussed handout in great detail with ATC.  All questions were discussed and answered.     Impression and Recommendations:     This case required medical decision making of moderate complexity.

## 2016-02-02 DIAGNOSIS — M179 Osteoarthritis of knee, unspecified: Secondary | ICD-10-CM | POA: Diagnosis not present

## 2016-02-06 DIAGNOSIS — N183 Chronic kidney disease, stage 3 (moderate): Secondary | ICD-10-CM | POA: Diagnosis not present

## 2016-02-06 DIAGNOSIS — I129 Hypertensive chronic kidney disease with stage 1 through stage 4 chronic kidney disease, or unspecified chronic kidney disease: Secondary | ICD-10-CM | POA: Diagnosis not present

## 2016-02-06 DIAGNOSIS — N189 Chronic kidney disease, unspecified: Secondary | ICD-10-CM | POA: Diagnosis not present

## 2016-02-06 DIAGNOSIS — D631 Anemia in chronic kidney disease: Secondary | ICD-10-CM | POA: Diagnosis not present

## 2016-02-06 DIAGNOSIS — E669 Obesity, unspecified: Secondary | ICD-10-CM | POA: Diagnosis not present

## 2016-02-11 ENCOUNTER — Other Ambulatory Visit: Payer: Self-pay | Admitting: Internal Medicine

## 2016-02-29 ENCOUNTER — Ambulatory Visit: Payer: BLUE CROSS/BLUE SHIELD | Admitting: Acute Care

## 2016-03-10 NOTE — Progress Notes (Signed)
[name] [address] [place], [place] [address] Phone: [phone] Subjective:    I'm seeing this patient by the request  of:  Cathlean Cower, MD   CC: Left knee follow-up  [id number]  Tara [name] is a 65 y.o. female coming in with complaint of  Bilateral knee pain.  Patient has been found to have severe arthritic changes of the knees bilaterally left greater than right. Patient recently did have a flare of her sarcoidosis and hypercalcemia. Patient was in rehabilitation. Patient seems to be doing very well. We discussed icing regimen and home exercises. We discussed which activities to do an which was potentially avoid. Patient will continue to be very active. Patient states that she is having worsening pain when she wears the brace. Denies though any new symptoms. Patient is looking forward to starting a walking regimen.  Past Medical History:  Diagnosis Date  . ANKLE PAIN, LEFT 04/01/2008  . ANXIETY 04/17/2007  . Chronic kidney disease   . COLONIC POLYPS, HX OF 08/01/2007  . CONTUSIONS, MULTIPLE 04/01/2009  . DEPRESSION 04/17/2007  . DIZZINESS 08/01/2007  . DYSPNEA 08/01/2007  . Enlargement of lymph nodes 08/13/2007  . GLUCOSE INTOLERANCE 08/01/2007  . 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 HYPERLIPIDEMIA 08/01/2007  . HYPERSOMNIA 07/28/2008  . HYPERTENSION 04/17/2007  . Impaired glucose tolerance 03/23/2011  . JOINT EFFUSION, LEFT KNEE 06/02/2010  . Loose body in knee 04/01/2009  . Migraines    "stopped 3-4 yr ago" (07/23/2012)  . Morbid obesity ([place]) 04/20/2007  . OTHER DISEASES OF LUNG NOT ELSEWHERE CLASSIFIED 08/01/2007  . Pain in joint, lower leg 04/01/2009  . PERIPHERAL EDEMA 04/21/2009  . Sarcoidosis ([place]) 09/25/2007  . SHOULDER PAIN, LEFT 04/01/2009   Past Surgical History:  Procedure Laterality Date  . BREAST SURGERY     Biopsy benign  . COMBINED MEDIASTINOSCOPY AND BRONCHOSCOPY  08/2007  . COMBINED MEDIASTINOSCOPY AND BRONCHOSCOPY  2009    . FRACTURE SURGERY  ?02/1997   "left upper arm; put rod in" (07/23/2012)  . GUM SURGERY  2000-?2009   "several ORs; soft tissue graft; took material from roof of mouth" (07/23/2012  . KNEE ARTHROSCOPY  03/2004; 06/2009   "right; left" Dr. Theda Sers  . LYMPH NODE BIOPSY  ~ 2009   "for sarcoidosis; don't know exactly which nodes" (07/23/2102)  . MYOMECTOMY  1994  . REFRACTIVE SURGERY  08/1998   "both eyes" (07/23/2012)   Social History  Substance Use Topics  . Smoking status: Never Smoker  . Smokeless tobacco: Never Used  . Alcohol use 4.2 oz/week    7 Standard drinks or equivalent per week   Allergies  Allergen Reactions  . Chocolate Other (See Comments)    SOMETIMES CAUSES SEVERE HEADACHES  . Floxin [Ocuflox]     shaky  . Fluoxetine Hcl     Suicidal thoughts  . Other Nausea And Vomiting    INSTANT ICED TEA PACKETS  . Sulfonamide Derivatives Rash   Family History  Problem Relation Age of Onset  . Heart disease Father 54  . Hypertension Father   . Diabetes Mother   . Hypertension Mother   . Stroke Mother   . Hypertension Sister   . Asthma Sister   . Colon cancer Neg Hx      Past medical history, social, surgical and family history all reviewed in electronic medical record.   Review of Systems: No headache, visual changes, nausea, vomiting, diarrhea, constipation, dizziness, abdominal pain, skin rash,  fevers, chills, night sweats, weight loss, swollen lymph nodes, body aches, joint swelling, muscle aches, chest pain, shortness of breath, mood changes.   Objective  Blood pressure 130/80, pulse 76, weight 212 lb (96.2 kg), SpO2 99 %.  General: No apparent distress alert and oriented x3 mood and affect normal, dressed appropriately. Has lost weight from previous exam HEENT: Pupils equal, extraocular movements intact and blinks a lot likely nervous twitch Respiratory: Patient's speak in full sentences and does not appear short of breath  Cardiovascular: No lower extremity edema,  non tender, no erythema  Skin: Warm dry intact with no signs of infection or rash on extremities or on axial skeleton.  Abdomen: Soft nontender  Neuro: Cranial nerves II through XII are intact, neurovascularly intact in all extremities with 2+ DTRs and 2+ pulses.  Lymph: No lymphadenopathy of posterior or anterior cervical chain or axillae bilaterally.  Gait normal with good balance and coordination.  MSK:  Non tender with full range of motion and good stability and symmetric strength and tone of shoulders, elbows, wrist, hip, bilaterally.    Bilateral Knee exam shows the patient does have varus deformity of the knee. Significant instability even with walking. Antalgic gait noted but mild improvement. Continue mild instability noted. Patient has significant less pain but still is tender over the medial joint line.     Impression and Recommendations:     This case required medical decision making of moderate complexity.

## 2016-03-12 ENCOUNTER — Encounter: Payer: Self-pay | Admitting: Family Medicine

## 2016-03-12 ENCOUNTER — Ambulatory Visit (INDEPENDENT_AMBULATORY_CARE_PROVIDER_SITE_OTHER): Payer: BLUE CROSS/BLUE SHIELD | Admitting: Family Medicine

## 2016-03-12 DIAGNOSIS — M170 Bilateral primary osteoarthritis of knee: Secondary | ICD-10-CM | POA: Diagnosis not present

## 2016-03-12 NOTE — Patient Instructions (Signed)
I am proud of you.  I will have Thur[facility] Butts give you a call and see if they can make adjustments.  I think you are doing great and do not change a thing.  Try to increase activity slowly.   Start with walking 3 times a week.  Start 20 minutes every day Next week add 5 minutes to 25 minutes.   Continue to add 5 minutes a week.  If worsening pain then take a step back. Wear the good shoes See me again in 5 weeks and we will make sure you are dong well.

## 2016-03-12 NOTE — Assessment & Plan Note (Signed)
Patient seems to be doing relatively well with conservative therapy. Patient doesn't want to start doing more walking/running protocol. Patient given very slowly titration up. We discussed that running with her knees only likely not be the best thing. Patient is going to work on icing regimen. Patient come back and see me again if any worsening symptoms any point. Patient will have her custom brace readjusted to see if this will be beneficial.  Spent  25 minutes with patient face-to-face and had greater than 50% of counseling including as described above in assessment and plan.

## 2016-03-28 ENCOUNTER — Encounter: Payer: Self-pay | Admitting: Internal Medicine

## 2016-03-28 ENCOUNTER — Encounter: Payer: Self-pay | Admitting: Family Medicine

## 2016-03-28 DIAGNOSIS — Z23 Encounter for immunization: Secondary | ICD-10-CM | POA: Diagnosis not present

## 2016-03-29 ENCOUNTER — Other Ambulatory Visit (INDEPENDENT_AMBULATORY_CARE_PROVIDER_SITE_OTHER): Payer: BLUE CROSS/BLUE SHIELD

## 2016-03-29 ENCOUNTER — Ambulatory Visit (INDEPENDENT_AMBULATORY_CARE_PROVIDER_SITE_OTHER): Payer: BLUE CROSS/BLUE SHIELD | Admitting: Internal Medicine

## 2016-03-29 ENCOUNTER — Encounter: Payer: Self-pay | Admitting: Internal Medicine

## 2016-03-29 VITALS — BP 124/78 | HR 71 | Temp 98.6°F | Resp 20 | Wt 218.0 lb

## 2016-03-29 DIAGNOSIS — T380X5A Adverse effect of glucocorticoids and synthetic analogues, initial encounter: Secondary | ICD-10-CM

## 2016-03-29 DIAGNOSIS — I10 Essential (primary) hypertension: Secondary | ICD-10-CM

## 2016-03-29 DIAGNOSIS — E099 Drug or chemical induced diabetes mellitus without complications: Secondary | ICD-10-CM

## 2016-03-29 DIAGNOSIS — N184 Chronic kidney disease, stage 4 (severe): Secondary | ICD-10-CM

## 2016-03-29 DIAGNOSIS — Z0000 Encounter for general adult medical examination without abnormal findings: Secondary | ICD-10-CM | POA: Diagnosis not present

## 2016-03-29 LAB — LIPID PANEL
CHOL/HDL RATIO: 3
CHOLESTEROL: 157 mg/dL (ref 0–200)
HDL: 46 mg/dL (ref >39.00)
LDL Cholesterol: 78 mg/dL (ref 0–99)
NonHDL: 110.98
TRIGLYCERIDES: 166 mg/dL — AB (ref 0.0–149.0)
VLDL: 33.2 mg/dL (ref 0.0–40.0)

## 2016-03-29 LAB — BASIC METABOLIC PANEL
BUN: 38 mg/dL — AB (ref 6–23)
CHLORIDE: 100 meq/L (ref 96–112)
CO2: 31 mEq/L (ref 19–32)
CREATININE: 1.49 mg/dL — AB (ref 0.40–1.20)
Calcium: 10 mg/dL (ref 8.4–10.5)
GFR: 37.53 mL/min — ABNORMAL LOW (ref >60.00)
GLUCOSE: 103 mg/dL — AB (ref 70–99)
Potassium: 3.9 mEq/L (ref 3.5–5.1)
Sodium: 136 mEq/L (ref 135–145)

## 2016-03-29 LAB — HEPATIC FUNCTION PANEL
ALBUMIN: 4.1 g/dL (ref 3.5–5.2)
ALT: 22 U/L (ref 0–35)
AST: 19 U/L (ref 0–37)
Alkaline Phosphatase: 103 U/L (ref 39–117)
BILIRUBIN TOTAL: 0.4 mg/dL (ref 0.2–1.2)
Bilirubin, Direct: 0.1 mg/dL (ref 0.0–0.3)
TOTAL PROTEIN: 7.4 g/dL (ref 6.0–8.3)

## 2016-03-29 LAB — HEMOGLOBIN A1C: Hgb A1c MFr Bld: 5.9 % (ref 4.6–6.5)

## 2016-03-29 NOTE — Assessment & Plan Note (Signed)
stable overall by history and exam, recent data reviewed with pt, and pt to continue medical treatment as before,  to f/u any worsening symptoms or concerns Lab Results  Component Value Date   HGBA1C 6.6 (H) 09/27/2015

## 2016-03-29 NOTE — Patient Instructions (Signed)
Please continue all other medications as before, and refills have been done if requested.  Please have the pharmacy call with any other refills you may need.  Please continue your efforts at being more active, low cholesterol diet, and weight control.  You are otherwise up to date with prevention measures today.  Please keep your appointments with your specialists as you may have planned - Dr Toy Care, and Dr Posey Pronto  Please go to the LAB in the Basement (turn left off the elevator) for the tests to be done today  You will be contacted by phone if any changes need to be made immediately.  Otherwise, you will receive a letter about your results with an explanation, but please check with MyChart first.  Please remember to sign up for MyChart if you have not done so, as this will be important to you in the future with finding out test results, communicating by private email, and scheduling acute appointments online when needed.  Please return in 6 months, or sooner if needed

## 2016-03-29 NOTE — Assessment & Plan Note (Signed)
stable overall by history and exam, recent data reviewed with pt, and pt to continue medical treatment as before,  to f/u any worsening symptoms or concerns Lab Results  Component Value Date   CREATININE 1.44 (H) 01/18/2016   For fu lab, also f//u renal as planned

## 2016-03-29 NOTE — Assessment & Plan Note (Signed)
stable overall by history and exam, recent data reviewed with pt, and pt to continue medical treatment as before,  to f/u any worsening symptoms or concerns BP Readings from Last 3 Encounters:  03/29/16 124/78  03/12/16 130/80  01/27/16 126/84

## 2016-03-29 NOTE — Progress Notes (Signed)
Pre visit review using our clinic review tool, if applicable. No additional management support is needed unless otherwise documented below in the visit note. 

## 2016-03-29 NOTE — Progress Notes (Signed)
Subjective:    Patient ID: Tara [name], female    DOB: [birth year], 65 y.o.   MRN: [id number]  HPI  Here to f/u; overall doing ok,  Pt denies chest pain, increasing sob or doe, wheezing, orthopnea, PND, increased LE swelling, palpitations, dizziness or syncope.  Pt denies new neurological symptoms such as new headache, or facial or extremity weakness or numbness.  Pt denies polydipsia, polyuria, or low sugar episode.   Pt denies new neurological symptoms such as new headache, or facial or extremity weakness or numbness.   Pt states overall good compliance with meds, mostly trying to follow appropriate diet, with wt overall stable,  but little exercise however.  Conts to see renal with next appt in oct per pt.  Back to work since aug 15, going ok, but still with recurring dizzy, nausea without HA, fever, ear or sinus disease. Fredrich Birks does not want her to work overtime, has done this quite a bit in past without being paid.  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   Past Medical History:  Diagnosis Date  . ANKLE PAIN, LEFT 04/01/2008  . ANXIETY 04/17/2007  . Chronic kidney disease   . COLONIC POLYPS, HX OF 08/01/2007  . CONTUSIONS, MULTIPLE 04/01/2009  . DEPRESSION 04/17/2007  . DIZZINESS 08/01/2007  . DYSPNEA 08/01/2007  . Enlargement of lymph nodes 08/13/2007  . GLUCOSE INTOLERANCE 08/01/2007  . 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 HYPERLIPIDEMIA 08/01/2007  . HYPERSOMNIA 07/28/2008  . HYPERTENSION 04/17/2007  . Impaired glucose tolerance 03/23/2011  . JOINT EFFUSION, LEFT KNEE 06/02/2010  . Loose body in knee 04/01/2009  . Migraines    "stopped 3-4 yr ago" (07/23/2012)  . Morbid obesity ([place]) 04/20/2007  . OTHER DISEASES OF LUNG NOT ELSEWHERE CLASSIFIED 08/01/2007  . Pain in joint, lower leg 04/01/2009  . PERIPHERAL EDEMA 04/21/2009  . Sarcoidosis ([place]) 09/25/2007  . SHOULDER PAIN, LEFT 04/01/2009   Past Surgical History:  Procedure Laterality Date  . BREAST SURGERY     Biopsy  benign  . COMBINED MEDIASTINOSCOPY AND BRONCHOSCOPY  08/2007  . COMBINED MEDIASTINOSCOPY AND BRONCHOSCOPY  2009  . FRACTURE SURGERY  ?02/1997   "left upper arm; put rod in" (07/23/2012)  . GUM SURGERY  2000-?2009   "several ORs; soft tissue graft; took material from roof of mouth" (07/23/2012  . KNEE ARTHROSCOPY  03/2004; 06/2009   "right; left" Dr. Theda Sers  . LYMPH NODE BIOPSY  ~ 2009   "for sarcoidosis; don't know exactly which nodes" (07/23/2102)  . MYOMECTOMY  1994  . REFRACTIVE SURGERY  08/1998   "both eyes" (07/23/2012)    reports that she has never smoked. She has never used smokeless tobacco. She reports that she drinks about 4.2 oz of alcohol per week . She reports that she does not use drugs. family history includes Asthma in her sister; Diabetes in her mother; Heart disease (age of onset: 9) in her father; Hypertension in her father, mother, and sister; Stroke in her mother. Allergies  Allergen Reactions  . Chocolate Other (See Comments)    SOMETIMES CAUSES SEVERE HEADACHES  . Floxin [Ocuflox]     shaky  . Fluoxetine Hcl     Suicidal thoughts  . Other Nausea And Vomiting    INSTANT ICED TEA PACKETS  . Sulfonamide Derivatives Rash   Current Outpatient Prescriptions on File Prior to Visit  Medication Sig Dispense Refill  . albuterol (PROVENTIL HFA;VENTOLIN HFA) 108 (90 Base) MCG/ACT inhaler Inhale 2 puffs into  the lungs every 6 (six) hours as needed for wheezing or shortness of breath. 18 g 11  . amphetamine-dextroamphetamine (ADDERALL XR) 30 MG 24 hr capsule Take 1 capsule (30 mg total) by mouth daily. 10 capsule 0  . aspirin 81 MG tablet Take 81 mg by mouth daily.      Marland Kitchen atenolol (TENORMIN) 50 MG tablet TAKE 1 TABLET(50 MG) BY MOUTH DAILY 90 tablet 3  . atenolol (TENORMIN) 50 MG tablet TAKE 1 TABLET(50 MG) BY MOUTH AT BEDTIME 90 tablet 0  . calcitonin, salmon, (MIACALCIN/FORTICAL) 200 UNIT/ACT nasal spray Place 1 spray into both nostrils daily.   2  . clorazepate (TRANXENE) 15 MG  tablet Take 1 tablet (15 mg total) by mouth at bedtime. 10 tablet 0  . diltiazem (DILT-XR) 240 MG 24 hr capsule Take 1 capsule (240 mg total) by mouth daily. 90 capsule 3  . ferrous sulfate 325 (65 FE) MG tablet Take 325 mg by mouth daily with breakfast.    . fluticasone (FLONASE) 50 MCG/ACT nasal spray Place 2 sprays into both nostrils daily.    . furosemide (LASIX) 40 MG tablet Take 1 tablet (40 mg total) by mouth 2 (two) times daily. 30 tablet 0  . glucosamine-chondroitin 500-400 MG tablet Take 1 tablet by mouth daily.     . hydrALAZINE (APRESOLINE) 100 MG tablet Take 1 tablet (100 mg total) by mouth every 8 (eight) hours.    . Multiple Vitamins-Minerals (MULTIVITAMIN WITH MINERALS) tablet Take 1 tablet by mouth daily.    . traMADol (ULTRAM) 50 MG tablet Take 1 tablet (50 mg total) by mouth every 12 (twelve) hours as needed. 60 tablet 0  . venlafaxine XR (EFFEXOR-XR) 75 MG 24 hr capsule Take 1 capsule (75 mg total) by mouth daily with breakfast.     No current facility-administered medications on file prior to visit.      Review of Systems  Constitutional: Negative for unusual diaphoresis or night sweats HENT: Negative for ear swelling or discharge Eyes: Negative for worsening visual haziness  Respiratory: Negative for choking and stridor.   Gastrointestinal: Negative for distension or worsening eructation Genitourinary: Negative for retention or change in urine volume.  Musculoskeletal: Negative for other MSK pain or swelling Skin: Negative for color change and worsening wound Neurological: Negative for tremors and numbness other than noted  Psychiatric/Behavioral: Negative for decreased concentration or agitation other than above       Objective:   Physical Exam BP 124/78   Pulse 71   Temp 98.6 F (37 C) (Oral)   Resp 20   Wt 218 lb (98.9 kg)   SpO2 97%   BMI 35.19 kg/m  VS noted, not ill appaering Constitutional: Pt appears in no apparent distress HENT: Head: NCAT.    Right Ear: External ear normal.  Left Ear: External ear normal.  Eyes: . Pupils are equal, round, and reactive to light. Conjunctivae and EOM are normal Neck: Normal range of motion. Neck supple.  Cardiovascular: Normal rate and regular rhythm.   Pulmonary/Chest: Effort normal and breath sounds without rales or wheezing.  Neurological: Pt is alert. Not confused , motor grossly intact Skin: Skin is warm. No rash, no LE edema Psychiatric: Pt behavior is normal. No agitation.     Assessment & Plan:

## 2016-03-29 NOTE — Assessment & Plan Note (Signed)
Mild to mod, for f/u lab,  to f/u any worsening symptoms or concerns

## 2016-03-30 LAB — HEPATITIS C ANTIBODY: HCV AB: NEGATIVE

## 2016-04-09 ENCOUNTER — Encounter: Payer: Self-pay | Admitting: Internal Medicine

## 2016-04-09 MED ORDER — ATENOLOL 50 MG PO TABS: ORAL_TABLET | ORAL | 1 refills | Status: DC

## 2016-04-09 NOTE — Telephone Encounter (Signed)
Sent rx for atenolol to [facility]...Tara [name]

## 2016-04-10 ENCOUNTER — Other Ambulatory Visit: Payer: Self-pay | Admitting: Gynecology

## 2016-04-10 DIAGNOSIS — Z1231 Encounter for screening mammogram for malignant neoplasm of breast: Secondary | ICD-10-CM

## 2016-04-17 ENCOUNTER — Inpatient Hospital Stay: Admission: RE | Admit: 2016-04-17 | Payer: Self-pay | Source: Ambulatory Visit

## 2016-04-19 ENCOUNTER — Ambulatory Visit
Admission: RE | Admit: 2016-04-19 | Discharge: 2016-04-19 | Disposition: A | Discharge status: Home / Self Care | Payer: BLUE CROSS/BLUE SHIELD | Source: Ambulatory Visit | Attending: Gynecology | Admitting: Gynecology

## 2016-04-19 DIAGNOSIS — Z1231 Encounter for screening mammogram for malignant neoplasm of breast: Secondary | ICD-10-CM

## 2016-04-23 NOTE — Progress Notes (Signed)
[name] [place],  [address] Phone: [phone] Subjective:    I'm seeing this patient by the request  of:  Cathlean Cower, MD   CC: Left knee follow-up  HMC:[id number]  Tara [name] is a 65 y.o. female coming in with complaint of  Bilateral knee pain.  Patient has been found to have severe arthritic changes of the knees bilaterally left greater than right. Patient recently did have a flare of her sarcoidosis and hypercalcemia. Patient was in rehabilitation. Patient seems to be doing very well. We discussed icing regimen and home exercises. Patient continued to lose weight. Feels that her knee feels better overall but still has instability. Has been wearing the custom brace but feels that it is moving. Patient states that it has even rubbed a blister before but now seems to have resolved. [place] to increase walking but has not found the time. No new symptoms.  Past Medical History:  Diagnosis Date  . ANKLE PAIN, LEFT 04/01/2008  . ANXIETY 04/17/2007  . Chronic kidney disease   . COLONIC POLYPS, HX OF 08/01/2007  . CONTUSIONS, MULTIPLE 04/01/2009  . DEPRESSION 04/17/2007  . DIZZINESS 08/01/2007  . DYSPNEA 08/01/2007  . Enlargement of lymph nodes 08/13/2007  . GLUCOSE INTOLERANCE 08/01/2007  . 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 HYPERLIPIDEMIA 08/01/2007  . HYPERSOMNIA 07/28/2008  . HYPERTENSION 04/17/2007  . Impaired glucose tolerance 03/23/2011  . JOINT EFFUSION, LEFT KNEE 06/02/2010  . Loose body in knee 04/01/2009  . Migraines    "stopped 3-4 yr ago" (07/23/2012)  . Morbid obesity ([place]) 04/20/2007  . OTHER DISEASES OF LUNG NOT ELSEWHERE CLASSIFIED 08/01/2007  . Pain in joint, lower leg 04/01/2009  . PERIPHERAL EDEMA 04/21/2009  . Sarcoidosis ([place]) 09/25/2007  . SHOULDER PAIN, LEFT 04/01/2009   Past Surgical History:  Procedure Laterality Date  . BREAST SURGERY     Biopsy benign  . COMBINED MEDIASTINOSCOPY AND BRONCHOSCOPY  08/2007  .  COMBINED MEDIASTINOSCOPY AND BRONCHOSCOPY  2009  . FRACTURE SURGERY  ?02/1997   "left upper arm; put rod in" (07/23/2012)  . GUM SURGERY  2000-?2009   "several ORs; soft tissue graft; took material from roof of mouth" (07/23/2012  . KNEE ARTHROSCOPY  03/2004; 06/2009   "right; left" Dr. Theda Sers  . LYMPH NODE BIOPSY  ~ 2009   "for sarcoidosis; don't know exactly which nodes" (07/23/2102)  . MYOMECTOMY  1994  . REFRACTIVE SURGERY  08/1998   "both eyes" (07/23/2012)   Social History  Substance Use Topics  . Smoking status: Never Smoker  . Smokeless tobacco: Never Used  . Alcohol use 4.2 oz/week    7 Standard drinks or equivalent per week   Allergies  Allergen Reactions  . Chocolate Other (See Comments)    SOMETIMES CAUSES SEVERE HEADACHES  . Floxin [Ocuflox]     shaky  . Fluoxetine Hcl     Suicidal thoughts  . Other Nausea And Vomiting    INSTANT ICED TEA PACKETS  . Sulfonamide Derivatives Rash   Family History  Problem Relation Age of Onset  . Heart disease Father 57  . Hypertension Father   . Diabetes Mother   . Hypertension Mother   . Stroke Mother   . Hypertension Sister   . Asthma Sister   . Colon cancer Neg Hx      Past medical history, social, surgical and family history all reviewed in electronic medical record.   Review of Systems: No headache,  visual changes, nausea, vomiting, diarrhea, constipation, dizziness, abdominal pain, skin rash, fevers, chills, night sweats, weight loss, swollen lymph nodes, body aches, joint swelling, muscle aches, chest pain, shortness of breath, mood changes.   Objective  Blood pressure 112/72, pulse 65, weight 219 lb (99.3 kg), SpO2 97 %.  General: No apparent distress alert and oriented x3 mood and affect normal, dressed appropriately.  HEENT: Pupils equal, extraocular movements intact and blinks a lot nervous twitch Respiratory: Patient's speak in full sentences and does not appear short of breath  Cardiovascular: No lower extremity  edema, non tender, no erythema  Skin: Warm dry intact with no signs of infection or rash on extremities or on axial skeleton.  Abdomen: Soft nontender  Neuro: Cranial nerves II through XII are intact, neurovascularly intact in all extremities with 2+ DTRs and 2+ pulses.  Lymph: No lymphadenopathy of posterior or anterior cervical chain or axillae bilaterally.  MSK:  Non tender with full range of motion and good stability and symmetric strength and tone of shoulders, elbows, wrist, hip, bilaterally.    Bilateral Knee exam shows the patient does have varus deformity of the knee. Significant instability even with walking. Antalgic gait noted but mild improvement. Continue mild instability noted. Continued tenderness over the medial and patellofemoral joint     Impression and Recommendations:     This case required medical decision making of moderate complexity.

## 2016-04-24 ENCOUNTER — Encounter: Payer: Self-pay | Admitting: Family Medicine

## 2016-04-24 ENCOUNTER — Ambulatory Visit: Payer: Self-pay | Admitting: Internal Medicine

## 2016-04-24 ENCOUNTER — Ambulatory Visit (INDEPENDENT_AMBULATORY_CARE_PROVIDER_SITE_OTHER): Payer: BLUE CROSS/BLUE SHIELD | Admitting: Family Medicine

## 2016-04-24 DIAGNOSIS — M170 Bilateral primary osteoarthritis of knee: Secondary | ICD-10-CM

## 2016-04-24 NOTE — Assessment & Plan Note (Signed)
Discussed with patient at great length. Patient does not want to make any significant changes with her seeming to be stable. Patient's has not taken any medication for pain on a regular dose at this moment. Doing more of the natural supplementations. Encourage patient to try to do some walking but we also discussed lower impact exercises. We discussed the possibility of surgical intervention with her having severe osteophytic changes. Patient states that she would like to continue to consider it. Otherwise patient will follow-up with me again on an as-needed basis.

## 2016-05-03 ENCOUNTER — Encounter: Payer: Self-pay | Admitting: Internal Medicine

## 2016-05-03 ENCOUNTER — Other Ambulatory Visit (INDEPENDENT_AMBULATORY_CARE_PROVIDER_SITE_OTHER): Payer: BLUE CROSS/BLUE SHIELD

## 2016-05-03 ENCOUNTER — Ambulatory Visit (INDEPENDENT_AMBULATORY_CARE_PROVIDER_SITE_OTHER)
Admission: RE | Admit: 2016-05-03 | Discharge: 2016-05-03 | Disposition: A | Discharge status: Home / Self Care | Payer: BLUE CROSS/BLUE SHIELD | Source: Ambulatory Visit | Attending: Internal Medicine | Admitting: Internal Medicine

## 2016-05-03 ENCOUNTER — Ambulatory Visit (INDEPENDENT_AMBULATORY_CARE_PROVIDER_SITE_OTHER): Payer: BLUE CROSS/BLUE SHIELD | Admitting: Internal Medicine

## 2016-05-03 VITALS — BP 118/80 | HR 66 | Ht 66.0 in | Wt 219.6 lb

## 2016-05-03 DIAGNOSIS — I10 Essential (primary) hypertension: Secondary | ICD-10-CM | POA: Diagnosis not present

## 2016-05-03 DIAGNOSIS — D869 Sarcoidosis, unspecified: Secondary | ICD-10-CM

## 2016-05-03 DIAGNOSIS — G4710 Hypersomnia, unspecified: Secondary | ICD-10-CM

## 2016-05-03 LAB — COMPREHENSIVE METABOLIC PANEL
ALBUMIN: 4.3 g/dL (ref 3.5–5.2)
ALK PHOS: 118 U/L — AB (ref 39–117)
ALT: 28 U/L (ref 0–35)
AST: 26 U/L (ref 0–37)
BUN: 41 mg/dL — AB (ref 6–23)
CALCIUM: 10.3 mg/dL (ref 8.4–10.5)
CHLORIDE: 101 meq/L (ref 96–112)
CO2: 30 mEq/L (ref 19–32)
Creatinine, Ser: 1.49 mg/dL — ABNORMAL HIGH (ref 0.40–1.20)
GFR: 37.52 mL/min — ABNORMAL LOW (ref >60.00)
Glucose, Bld: 133 mg/dL — ABNORMAL HIGH (ref 70–99)
POTASSIUM: 4.1 meq/L (ref 3.5–5.1)
SODIUM: 139 meq/L (ref 135–145)
TOTAL PROTEIN: 7.8 g/dL (ref 6.0–8.3)
Total Bilirubin: 0.4 mg/dL (ref 0.2–1.2)

## 2016-05-03 NOTE — Progress Notes (Signed)
F never smoker followed for sarcoid complicated by hypercalcemia, renal insufficency    01/19/15- 64 yoF never smoker followed for sarcoid complicated by hypercalcemia, renal insufficency  FOLLOWS FOR: c/o still having occasional nonprod cough, sob with exertion.  Still DOE, but admits deconditioned. Little cough or wheeze. Got faint carrying bags at airport.  05/04/15-64 yoF never smoker followed for sarcoid complicated by hypercalcemia, renal insufficency  FOLLOWS FOR: Review PFT with patient in detail; pt continues to have SOB with exertion. PFT 02/07/15-minimal obstructive airways disease, minimal diffusion defect, insignificant response to bronchodilator  05/03/2016-65 yoF never smoker followed for sarcoid complicated by hypercalcemia, renal insufficency  FOLLOWS FOR: Pt. states her breathing is doing well, occ. still feels SOB, some coughing, Pt. denies chest pain, or tightness Hospitalized in May with? Metabolic encephalopathy/hypercalcemia. Followed by nephrology. She dropped off prednisone after hospital. Trouble sleeping at night and daytime sleepiness. Getting new bed. Rare need for rescue inhaler. Does not have an active diagnosis of asthma. PFT minimally abnormal 02/07/2015. CXR 12/21/2015-NAD ACE in July remained elevated 74 (8-52)  ROS- see HPI Constitutional:   +  weight loss, no-night sweats, fevers, chills, + fatigue, lassitude. HEENT:   No-  headaches, difficulty swallowing, tooth/dental problems, sore throat,       No-  sneezing, itching, ear ache, nasal congestion, post nasal drip,  CV:  No- chest pain, no-orthopnea, PND, swelling in lower extremities, anasarca,dizziness, palpitations  Resp: + shortness of breath with exertion or at rest.              No-   productive cough,  No non-productive cough,  No- coughing up of blood.              No- wheezing.   Skin: No-   rash or lesions. GI:  No-   heartburn, indigestion, abdominal pain, nausea, vomiting,  GU:  MS:  +   joint pain or swelling.   Neuro-     nothing unusual Psych:  No- change in mood or affect. No depression or anxiety.  No memory loss.  OBJ- Physical Exam General- Alert, Oriented, Affect-appropriate, Distress- none acute,  Skin- rash-none, lesions- none, excoriation- none, cosmetics smeared on face Lymphadenopathy- none Head- atraumatic            Eyes- Gross vision intact, PERRLA, conjunctivae clear, frequent blinking            Ears- Hearing, canals-normal            Nose- Clear, no-Septal dev, mucus, polyps, erosion, perforation             Throat- Mallampati II , mucosa clear , drainage- none, tonsils- atrophic Neck- flexible , trachea midline, no stridor , thyroid nl, carotid no bruit Chest - symmetrical excursion , unlabored           Heart/CV- RRR , no murmur , no gallop  , no rub, nl s1 s2                           - JVD- none , edema-none, stasis changes- none, varices- none           Lung- clear to P&A, wheeze- none, cough- none , dullness-none, rub- none           Chest wall-  Abd-  Br/ Gen/ Rectal- Not done, not indicated Extrem- cyanosis- none, clubbing, none, atrophy- none, strength- nl Neuro- +blinking, grimacing, ? Tics or frontal lobe ?

## 2016-05-03 NOTE — Patient Instructions (Addendum)
Order- CXR    Dx sarcoid  Order- lab      Angiotensin Converting Enzyme level, CMET   Dx sarcoid  Please call if needed

## 2016-05-04 LAB — ANGIOTENSIN CONVERTING ENZYME: ANGIOTENSIN-CONVERTING ENZYME: 106 U/L — AB (ref 9–67)

## 2016-05-06 NOTE — Assessment & Plan Note (Signed)
She thinks she has been sleepy because of poor quality sleep blamed on 63 year old mattress. She has ordered a new mattress. Plan-reviewed basic sleep hygiene. Watch for improvement.

## 2016-05-06 NOTE — Assessment & Plan Note (Signed)
She dropped off prednisone after leaving hospital. Chronically elevated ACE level of uncertain clinical significance. Plan-update ACE level off prednisone

## 2016-05-09 ENCOUNTER — Ambulatory Visit (INDEPENDENT_AMBULATORY_CARE_PROVIDER_SITE_OTHER): Payer: BLUE CROSS/BLUE SHIELD | Admitting: Gynecology

## 2016-05-09 ENCOUNTER — Encounter: Payer: Self-pay | Admitting: Gynecology

## 2016-05-09 VITALS — BP 112/70 | Ht 66.0 in | Wt 217.0 lb

## 2016-05-09 DIAGNOSIS — Z01419 Encounter for gynecological examination (general) (routine) without abnormal findings: Secondary | ICD-10-CM | POA: Diagnosis not present

## 2016-05-09 DIAGNOSIS — N952 Postmenopausal atrophic vaginitis: Secondary | ICD-10-CM

## 2016-05-09 NOTE — Progress Notes (Signed)
    Maycel Riffe 06-06-1951 [id number]        65 y.o.  G0P0  for annual exam.  Doing well from a gynecologic standpoint without complaints  Past medical history,surgical history, problem list, medications, allergies, family history and social history were all reviewed and documented as reviewed in the EPIC chart.  ROS:  Performed with pertinent positives and negatives included in the history, assessment and plan.   Additional significant findings :  None   Exam: [name] assistant Vitals:   05/09/16 1223  BP: 112/70  Weight: 217 lb (98.4 kg)  Height: 5\' 6"  (1.676 m)   Body mass index is 35.02 kg/m.  General appearance:  Normal affect, orientation and appearance. Skin: Grossly normal HEENT: Without gross lesions.  No cervical or supraclavicular adenopathy. Thyroid normal.  Lungs:  Clear without wheezing, rales or rhonchi Cardiac: RR, without RMG Abdominal:  Soft, nontender, without masses, guarding, rebound, organomegaly or hernia Breasts:  Examined lying and sitting without masses, retractions, discharge or axillary adenopathy. Pelvic:  Ext, BUS, Vagina with atrophic changes  Cervix hila vaginal vault difficult to visualize. No palpable abnormalities.  Uterus difficult to palpate but no gross masses or tenderness  Adnexa without gross masses or tenderness    Anus and perineum normal   Rectovaginal normal sphincter tone without palpated masses or tenderness.    Assessment/Plan:  65 y.o. G0P0 female for annual exam.   1. postmenopausal/atrophic genital changes. No significant hot flushes, night sweats, vaginal dryness or any vaginal bleeding. Continue to monitor report any issues or bleeding. 2. Mammography 04/2016. Continue with annual mammography when due. SBE monthly reviewed. 3. Pap smear/HPV 2014. No Pap smear done today. No history of significant abnormal Pap smears. Plan repeat Pap smear at 5 year interval per current screening guidelines. 4. DEXA 2014 normal. Plan  repeat DEXA at 5 year interval. 5. Colonoscopy 2014. Repeat at their recommended interval.SBE monthly reviewed. 6. health maintenance. No routine lab work done as patient reports this done elsewhere. Follow up in one year, sooner as needed.   Anastasio Auerbach MD, 12:41 PM 05/09/2016

## 2016-05-09 NOTE — Patient Instructions (Signed)

## 2016-05-10 DIAGNOSIS — D631 Anemia in chronic kidney disease: Secondary | ICD-10-CM | POA: Diagnosis not present

## 2016-05-10 DIAGNOSIS — N183 Chronic kidney disease, stage 3 (moderate): Secondary | ICD-10-CM | POA: Diagnosis not present

## 2016-05-10 DIAGNOSIS — I129 Hypertensive chronic kidney disease with stage 1 through stage 4 chronic kidney disease, or unspecified chronic kidney disease: Secondary | ICD-10-CM | POA: Diagnosis not present

## 2016-06-26 DIAGNOSIS — H5213 Myopia, bilateral: Secondary | ICD-10-CM | POA: Diagnosis not present

## 2016-06-26 DIAGNOSIS — H2513 Age-related nuclear cataract, bilateral: Secondary | ICD-10-CM | POA: Diagnosis not present

## 2016-06-27 ENCOUNTER — Encounter: Payer: Self-pay | Admitting: Family Medicine

## 2016-06-27 ENCOUNTER — Ambulatory Visit (INDEPENDENT_AMBULATORY_CARE_PROVIDER_SITE_OTHER): Payer: BLUE CROSS/BLUE SHIELD | Admitting: Family Medicine

## 2016-06-27 VITALS — BP 122/84 | HR 71 | Ht 66.0 in | Wt 220.0 lb

## 2016-06-27 DIAGNOSIS — M25562 Pain in left knee: Secondary | ICD-10-CM

## 2016-06-27 DIAGNOSIS — M170 Bilateral primary osteoarthritis of knee: Secondary | ICD-10-CM

## 2016-06-27 NOTE — Patient Instructions (Signed)
Good to see you  Wear the brace at night and try to keep the arm down.  Keep it up on the weight I am proud of you Dr. Mayer Camel at [facility] will be calling you  See me when you need me Happy holidays!

## 2016-06-27 NOTE — Assessment & Plan Note (Signed)
Patient has arthritis of the knees bilaterally. Patient's left knee has failed all conservative therapy including viscous supplementation. Patient's weight loss has helped out significantly but continues to have difficulty. Patient is ready to have a replacement. Patient is a good candidate for this. Will be referred today to orthopedic surgery for further evaluation and treatment. We discussed continuing to wear the custom brace if possible but would patient's recent weight loss does not seem to fit as well. Encourage patient to continue to stay active to be careful with certain activities.  Spent  25 minutes with patient face-to-face and had greater than 50% of counseling including as described above in assessment and plan.

## 2016-06-27 NOTE — Progress Notes (Signed)
[name] [name], [name] [address] Phone: [phone] Subjective:    I'm seeing this patient by the request  of:  Tara Cower, MD   CC: Left knee follow-up  UYQ:[id number]  Tara [name] is a 65 y.o. female coming in with complaint of  Bilateral knee pain.  Patient has been found to have severe arthritic changes of the knees bilaterally left greater than right Patient at last follow-up was doing very well. Was to increase her activity as well as increase her walking. Patient states she has been doing very well. Patient has noticed plateauing though of the weight loss. Patient is having some mild increase in instability of the knee. Patient is ready to have a knee replacement at this time.  Past Medical History:  Diagnosis Date  . ANKLE PAIN, LEFT 04/01/2008  . ANXIETY 04/17/2007  . Chronic kidney disease   . COLONIC POLYPS, HX OF 08/01/2007  . CONTUSIONS, MULTIPLE 04/01/2009  . DEPRESSION 04/17/2007  . DIZZINESS 08/01/2007  . DYSPNEA 08/01/2007  . Enlargement of lymph nodes 08/13/2007  . GLUCOSE INTOLERANCE 08/01/2007  . 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 HYPERLIPIDEMIA 08/01/2007  . HYPERSOMNIA 07/28/2008  . HYPERTENSION 04/17/2007  . Impaired glucose tolerance 03/23/2011  . JOINT EFFUSION, LEFT KNEE 06/02/2010  . Loose body in knee 04/01/2009  . Migraines    "stopped 3-4 yr ago" (07/23/2012)  . Morbid obesity ([place]) 04/20/2007  . OTHER DISEASES OF LUNG NOT ELSEWHERE CLASSIFIED 08/01/2007  . Pain in joint, lower leg 04/01/2009  . PERIPHERAL EDEMA 04/21/2009  . Sarcoidosis ([place]) 09/25/2007  . SHOULDER PAIN, LEFT 04/01/2009   Past Surgical History:  Procedure Laterality Date  . BREAST SURGERY     Biopsy benign  . COMBINED MEDIASTINOSCOPY AND BRONCHOSCOPY  08/2007  . COMBINED MEDIASTINOSCOPY AND BRONCHOSCOPY  2009  . FRACTURE SURGERY  ?02/1997   "left upper arm; put rod in" (07/23/2012)  . GUM SURGERY  2000-?2009   "several ORs; soft tissue  graft; took material from roof of mouth" (07/23/2012  . KNEE ARTHROSCOPY  03/2004; 06/2009   "right; left" Dr. Theda Sers  . LYMPH NODE BIOPSY  ~ 2009   "for sarcoidosis; don't know exactly which nodes" (07/23/2102)  . MYOMECTOMY  1994   Open  . REFRACTIVE SURGERY  08/1998   "both eyes" (07/23/2012)   Social History  Substance Use Topics  . Smoking status: Never Smoker  . Smokeless tobacco: Never Used  . Alcohol use Yes     Comment: Occas   Allergies  Allergen Reactions  . Chocolate Other (See Comments)    SOMETIMES CAUSES SEVERE HEADACHES  . Floxin [Ocuflox]     shaky  . Fluoxetine Hcl     Suicidal thoughts  . Other Nausea And Vomiting    INSTANT ICED TEA PACKETS  . Sulfonamide Derivatives Rash   Family History  Problem Relation Age of Onset  . Diabetes Mother   . Hypertension Mother   . Stroke Mother   . Hypertension Sister   . Asthma Sister   . Heart disease Father 60  . Hypertension Father   . Colon cancer Neg Hx      Past medical history, social, surgical and family history all reviewed in electronic medical record.   Review of Systems: No headache, visual changes, nausea, vomiting, diarrhea, constipation, dizziness, abdominal pain, skin rash, fevers, chills, night sweats, weight loss, swollen lymph nodes, body aches, joint swelling, muscle aches, chest pain, shortness of  breath, mood changes.    Objective  Blood pressure 122/84, pulse 71, height 5\' 6"  (1.676 m), weight 220 lb (99.8 kg), SpO2 98 %.  Systems examined below as of 06/27/16 General: NAD A&O x3 mood, affect normal  HEENT: Pupils equal, extraocular movements intact  Respiratory: not short of breath at rest or with speaking Cardiovascular: No lower extremity edema, non tender Skin: Warm dry intact with no signs of infection or rash on extremities or on axial skeleton. Abdomen: Soft nontender, no masses Neuro: Cranial nerves  intact, neurovascularly intact in all extremities with 2+ DTRs and 2+  pulses. Lymph: No lymphadenopathy appreciated today  Gait Antalgic gait MSK: Non tender with full range of motion and good stability and symmetric strength and tone of shoulders, elbows, wrist,  hips and ankles bilaterally.      Bilateral Knee exam shows the patient does have varus deformity of the knee. Significant instability But patient does have a better gait than previous exam. Still minorly antalgic. Patient's left knee has more tenderness over the medial joint line. Mild crepitus with range of motion.    Impression and Recommendations:     This case required medical decision making of moderate complexity.

## 2016-07-03 DIAGNOSIS — F411 Generalized anxiety disorder: Secondary | ICD-10-CM | POA: Diagnosis not present

## 2016-07-03 DIAGNOSIS — F3342 Major depressive disorder, recurrent, in full remission: Secondary | ICD-10-CM | POA: Diagnosis not present

## 2016-07-03 DIAGNOSIS — F900 Attention-deficit hyperactivity disorder, predominantly inattentive type: Secondary | ICD-10-CM | POA: Diagnosis not present

## 2016-07-03 DIAGNOSIS — M25562 Pain in left knee: Secondary | ICD-10-CM | POA: Diagnosis not present

## 2016-08-03 DIAGNOSIS — H2513 Age-related nuclear cataract, bilateral: Secondary | ICD-10-CM | POA: Diagnosis not present

## 2016-08-03 DIAGNOSIS — H04123 Dry eye syndrome of bilateral lacrimal glands: Secondary | ICD-10-CM | POA: Diagnosis not present

## 2016-08-03 DIAGNOSIS — H401134 Primary open-angle glaucoma, bilateral, indeterminate stage: Secondary | ICD-10-CM | POA: Diagnosis not present

## 2016-08-03 DIAGNOSIS — H43813 Vitreous degeneration, bilateral: Secondary | ICD-10-CM | POA: Diagnosis not present

## 2016-08-23 ENCOUNTER — Other Ambulatory Visit: Payer: Self-pay | Admitting: Orthopedic Surgery

## 2016-08-29 DIAGNOSIS — N183 Chronic kidney disease, stage 3 (moderate): Secondary | ICD-10-CM | POA: Diagnosis not present

## 2016-08-29 DIAGNOSIS — N189 Chronic kidney disease, unspecified: Secondary | ICD-10-CM | POA: Diagnosis not present

## 2016-08-31 ENCOUNTER — Other Ambulatory Visit: Payer: Self-pay | Admitting: Internal Medicine

## 2016-09-03 DIAGNOSIS — N183 Chronic kidney disease, stage 3 (moderate): Secondary | ICD-10-CM | POA: Diagnosis not present

## 2016-09-03 DIAGNOSIS — D631 Anemia in chronic kidney disease: Secondary | ICD-10-CM | POA: Diagnosis not present

## 2016-09-03 DIAGNOSIS — I129 Hypertensive chronic kidney disease with stage 1 through stage 4 chronic kidney disease, or unspecified chronic kidney disease: Secondary | ICD-10-CM | POA: Diagnosis not present

## 2016-09-05 ENCOUNTER — Ambulatory Visit (INDEPENDENT_AMBULATORY_CARE_PROVIDER_SITE_OTHER): Payer: BLUE CROSS/BLUE SHIELD | Admitting: Internal Medicine

## 2016-09-05 ENCOUNTER — Encounter: Payer: Self-pay | Admitting: Internal Medicine

## 2016-09-05 DIAGNOSIS — D869 Sarcoidosis, unspecified: Secondary | ICD-10-CM | POA: Diagnosis not present

## 2016-09-05 DIAGNOSIS — M25469 Effusion, unspecified knee: Secondary | ICD-10-CM | POA: Diagnosis not present

## 2016-09-05 NOTE — Progress Notes (Signed)
HPI F never smoker followed for sarcoid complicated by hypercalcemia, renal insufficency  PFT 02/07/15-minimal obstructive airways disease, minimal diffusion defect, insignificant response to bronchodilator ACE in July, 2017 remained elevated 74 (8-52) ---------------------------------------------------------------------------   05/03/2016-65 yoF never smoker followed for sarcoid complicated by hypercalcemia, renal insufficency  FOLLOWS FOR: Pt. states her breathing is doing well, occ. still feels SOB, some coughing, Pt. denies chest pain, or tightness Hospitalized in May with? Metabolic encephalopathy/hypercalcemia. Followed by nephrology. She dropped off prednisone after hospital. Trouble sleeping at night and daytime sleepiness. Getting new bed. Rare need for rescue inhaler. Does not have an active diagnosis of asthma. PFT minimally abnormal 02/07/2015. CXR 12/21/2015-NAD ACE in July, 2017 remained elevated 74 (8-52)  09/05/2016-66 year old female never smoker followed for sarcoid, complicated by hypercalcemia, renal insufficiency, HBP, depression Follows For: Sarcoid, pt reports she is doing well, she is having knee surgery For left knee TKR/Dr. Berenice Primas. Reports she has had no significant respiratory problems this winter with no major respiratory infection. Unchanged dyspnea on exertion if she tries to exercise. Denies fever, adenopathy, night sweats, rash  ROS- see HPI Constitutional:     weight loss, no-night sweats, fevers, chills, + fatigue, lassitude. HEENT:   No-  headaches, difficulty swallowing, tooth/dental problems, sore throat,       No-  sneezing, itching, ear ache, nasal congestion, post nasal drip,  CV:  No- chest pain, no-orthopnea, PND, swelling in lower extremities, anasarca,dizziness, palpitations  Resp: + shortness of breath with exertion or at rest.              No-   productive cough,  No non-productive cough,  No- coughing up of blood.              No- wheezing.    Skin: No-   rash or lesions. GI:  No-   heartburn, indigestion, abdominal pain, nausea, vomiting,  GU:  MS:  +  joint pain or swelling.   Neuro-     nothing unusual Psych:  No- change in mood or affect. No depression or anxiety.  No memory loss.  OBJ- Physical Exam  + stable baseline exam for her General- Alert, Oriented, Affect-appropriate, Distress- none acute,  Skin- rash-none, lesions- none, excoriation- none, cosmetics smeared on face Lymphadenopathy- none Head- atraumatic            Eyes- Gross vision intact, PERRLA, conjunctivae clear, frequent blinking            Ears- Hearing, canals-normal            Nose- Clear, no-Septal dev, mucus, polyps, erosion, perforation             Throat- Mallampati II , mucosa clear , drainage- none, tonsils- atrophic Neck- flexible , trachea midline, no stridor , thyroid nl, carotid no bruit Chest - symmetrical excursion , unlabored           Heart/CV- RRR , no murmur , no gallop  , no rub, nl s1 s2                           - JVD- none , edema-none, stasis changes- none, varices- none           Lung- clear to P&A, wheeze- none, cough- none , dullness-none, rub- none           Chest wall-  Abd-  Br/ Gen/ Rectal- Not done, not indicated Extrem- cyanosis- none, clubbing, none, atrophy- none, strength- nl  Neuro- +blinking, grimacing, ? Tics or frontal lobe ?

## 2016-09-05 NOTE — Patient Instructions (Signed)
Glad you are doing well. Good luck with your knee and cataract surgeries !  Please call if we can help

## 2016-09-07 ENCOUNTER — Encounter: Payer: Self-pay | Admitting: Family Medicine

## 2016-09-07 DIAGNOSIS — H401122 Primary open-angle glaucoma, left eye, moderate stage: Secondary | ICD-10-CM | POA: Diagnosis not present

## 2016-09-07 DIAGNOSIS — H401113 Primary open-angle glaucoma, right eye, severe stage: Secondary | ICD-10-CM | POA: Diagnosis not present

## 2016-09-07 NOTE — Assessment & Plan Note (Signed)
ACE remained elevated at 74 in July, 2017. She has not had symptoms suggestive of active systemic disease so we are choosing to update CXR and ACE level at next visit 6 months from now unless she has new problems.

## 2016-09-07 NOTE — Assessment & Plan Note (Signed)
Now pending left TKR.

## 2016-09-17 ENCOUNTER — Encounter (HOSPITAL_COMMUNITY): Payer: Self-pay

## 2016-09-17 ENCOUNTER — Encounter (HOSPITAL_COMMUNITY)
Admission: RE | Admit: 2016-09-17 | Discharge: 2016-09-17 | Disposition: A | Discharge status: Home / Self Care | Payer: BLUE CROSS/BLUE SHIELD | Source: Ambulatory Visit | Attending: Orthopedic Surgery | Admitting: Orthopedic Surgery

## 2016-09-17 DIAGNOSIS — Z01818 Encounter for other preprocedural examination: Secondary | ICD-10-CM | POA: Diagnosis not present

## 2016-09-17 DIAGNOSIS — M1712 Unilateral primary osteoarthritis, left knee: Secondary | ICD-10-CM | POA: Insufficient documentation

## 2016-09-17 HISTORY — DX: Sleep apnea, unspecified: G47.30

## 2016-09-17 HISTORY — DX: Prediabetes: R73.03

## 2016-09-17 HISTORY — DX: Unspecified osteoarthritis, unspecified site: M19.90

## 2016-09-17 HISTORY — DX: Anemia, unspecified: D64.9

## 2016-09-17 LAB — COMPREHENSIVE METABOLIC PANEL
ALBUMIN: 4.1 g/dL (ref 3.5–5.0)
ALT: 26 U/L (ref 14–54)
ANION GAP: 7 (ref 5–15)
AST: 29 U/L (ref 15–41)
Alkaline Phosphatase: 91 U/L (ref 38–126)
BILIRUBIN TOTAL: 0.6 mg/dL (ref 0.3–1.2)
BUN: 22 mg/dL — ABNORMAL HIGH (ref 6–20)
CALCIUM: 9.8 mg/dL (ref 8.9–10.3)
CO2: 29 mmol/L (ref 22–32)
Chloride: 101 mmol/L (ref 101–111)
Creatinine, Ser: 1.44 mg/dL — ABNORMAL HIGH (ref 0.44–1.00)
GFR calc non Af Amer: 38 mL/min — ABNORMAL LOW (ref >60)
GFR, EST AFRICAN AMERICAN: 44 mL/min — AB (ref >60)
GLUCOSE: 113 mg/dL — AB (ref 65–99)
POTASSIUM: 3.4 mmol/L — AB (ref 3.5–5.1)
SODIUM: 137 mmol/L (ref 135–145)
TOTAL PROTEIN: 7.7 g/dL (ref 6.5–8.1)

## 2016-09-17 LAB — CBC WITH DIFFERENTIAL/PLATELET
BASOS PCT: 0 %
Basophils Absolute: 0 10*3/uL (ref 0.0–0.1)
EOS ABS: 0 10*3/uL (ref 0.0–0.7)
Eosinophils Relative: 0 %
HCT: 39.3 % (ref 36.0–46.0)
Hemoglobin: 12.8 g/dL (ref 12.0–15.0)
LYMPHS ABS: 1.5 10*3/uL (ref 0.7–4.0)
Lymphocytes Relative: 25 %
MCH: 27.1 pg (ref 26.0–34.0)
MCHC: 32.6 g/dL (ref 30.0–36.0)
MCV: 83.1 fL (ref 78.0–100.0)
MONOS PCT: 11 %
Monocytes Absolute: 0.7 10*3/uL (ref 0.1–1.0)
Neutro Abs: 3.9 10*3/uL (ref 1.7–7.7)
Neutrophils Relative %: 64 %
Platelets: 251 10*3/uL (ref 150–400)
RBC: 4.73 MIL/uL (ref 3.87–5.11)
RDW: 14.7 % (ref 11.5–15.5)
WBC: 6.1 10*3/uL (ref 4.0–10.5)

## 2016-09-17 LAB — URINALYSIS, ROUTINE W REFLEX MICROSCOPIC
Bilirubin Urine: NEGATIVE
Glucose, UA: NEGATIVE mg/dL
Hgb urine dipstick: NEGATIVE
KETONES UR: NEGATIVE mg/dL
LEUKOCYTES UA: NEGATIVE
NITRITE: NEGATIVE
PH: 6 (ref 5.0–8.0)
PROTEIN: NEGATIVE mg/dL
Specific Gravity, Urine: 1.002 — ABNORMAL LOW (ref 1.005–1.030)

## 2016-09-17 LAB — PROTIME-INR
INR: 0.98
Prothrombin Time: 13 seconds (ref 11.4–15.2)

## 2016-09-17 LAB — GLUCOSE, CAPILLARY: GLUCOSE-CAPILLARY: 162 mg/dL — AB (ref 65–99)

## 2016-09-17 LAB — SURGICAL PCR SCREEN
MRSA, PCR: NEGATIVE
STAPHYLOCOCCUS AUREUS: POSITIVE — AB

## 2016-09-17 LAB — APTT: aPTT: 34 seconds (ref 24–36)

## 2016-09-17 NOTE — Pre-Procedure Instructions (Addendum)
    Tara [name]  09/17/2016      [facility] [address], [place] [address] Phone: [phone] Fax: [phone]  [facility] [place] [place] [facility] [address] [facility] [address] [place] [address] Phone: [phone] Fax: [phone]    Your procedure is scheduled on Monday, March 12th   Report to [facility] Admitting at 10:30 am   Call this number if you have problems the [facility] of surgery:  [phone] or [phone] Mon-Fri from 8am - 4:30 pm   Remember:  Do not eat food or drink liquids after midnight Sunday.              4-5 days prior to surgery, STOP taking any Vitamins, Herbal Supplements, Anti-inflammatories   Take these medicines the morning of surgery with A SIP OF WATER : Tranxene, Tramadol, clonidine,apresaline   Do not wear jewelry, make-up or nail polish.  Do not wear lotions, powders, perfumes, or deoderant.  Do not shave 48 hours prior to surgery.     Do not bring valuables to the hospital.  [facility] is not responsible for any belongings or valuables.  Contacts, dentures or bridgework may not be worn into surgery.  Leave your suitcase in the car.  After surgery it may be brought to your room.  For patients admitted to the hospital, discharge time will be determined by your treatment team.  Please read over the following fact sheets that you were given. Pain Booklet, MRSA Information and Surgical Site Infection Prevention

## 2016-09-17 NOTE — Progress Notes (Signed)
Mupirocin Ointment Rx called into [facility] on [address] and [facility] for positive PCR of Staph. Pt notified and voiced understanding.

## 2016-09-18 LAB — HEMOGLOBIN A1C
Hgb A1c MFr Bld: 5.7 % — ABNORMAL HIGH (ref 4.8–5.6)
Mean Plasma Glucose: 117 mg/dL

## 2016-09-18 NOTE — Progress Notes (Signed)
Blood bank called and said yesterdays sample 'showed' antibodies.  Another sample to be drawn the DOS

## 2016-09-20 ENCOUNTER — Ambulatory Visit (INDEPENDENT_AMBULATORY_CARE_PROVIDER_SITE_OTHER): Payer: BLUE CROSS/BLUE SHIELD | Admitting: Internal Medicine

## 2016-09-20 VITALS — BP 126/86 | HR 103 | Temp 98.2°F | Ht 66.0 in | Wt 224.0 lb

## 2016-09-20 DIAGNOSIS — E099 Drug or chemical induced diabetes mellitus without complications: Secondary | ICD-10-CM

## 2016-09-20 DIAGNOSIS — T380X5A Adverse effect of glucocorticoids and synthetic analogues, initial encounter: Secondary | ICD-10-CM

## 2016-09-20 DIAGNOSIS — Z0000 Encounter for general adult medical examination without abnormal findings: Secondary | ICD-10-CM | POA: Diagnosis not present

## 2016-09-20 NOTE — Patient Instructions (Signed)
Please continue all other medications as before, and refills have been done if requested.  Please have the pharmacy call with any other refills you may need.  Please continue your efforts at being more active, low cholesterol diet, and weight control.  You are otherwise up to date with prevention measures today.  Please keep your appointments with your specialists as you may have planned  Please return in 6 months, or sooner if needed, with Lab testing done 3-5 days before  

## 2016-09-20 NOTE — Progress Notes (Signed)
Subjective:    Patient ID: Tara [name], female    DOB: 01-05-1951, 66 y.o.   MRN: [id number]  HPI  Here for wellness and f/u;  Overall doing ok;  Pt denies Chest pain, worsening SOB, DOE, wheezing, orthopnea, PND, worsening LE edema, palpitations, dizziness or syncope.  Pt denies neurological change such as new headache, facial or extremity weakness.  Pt denies polydipsia, polyuria, or low sugar symptoms. Pt states overall good compliance with treatment and medications, good tolerability, and has been trying to follow appropriate diet.  Pt denies worsening depressive symptoms, suicidal ideation or panic. No fever, night sweats, wt loss, loss of appetite, or other constitutional symptoms.  Pt states good ability with ADL's, has low fall risk, home safety reviewed and adequate, no other significant changes in hearing or vision, and only occasionally active with exercise, due to knee pain.  Due for left knee TKA next Monday.  May need cataract surgury soon, last seen per Optho feb 23, had some elevated pressures to the eyes but mild, no surgury now, has f/u appt may 2018.  Also fu planned with Dr Patel/renal may 2018. Past Medical History:  Diagnosis Date  . Anemia   . ANKLE PAIN, LEFT 04/01/2008  . ANXIETY 04/17/2007  . Arthritis   . Chronic kidney disease   . COLONIC POLYPS, HX OF 08/01/2007  . CONTUSIONS, MULTIPLE 04/01/2009  . DEPRESSION 04/17/2007  . DIZZINESS 08/01/2007  . DYSPNEA 08/01/2007   with exertion  . Enlargement of lymph nodes 08/13/2007  . GLUCOSE INTOLERANCE 08/01/2007  . 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 HYPERLIPIDEMIA 08/01/2007  . HYPERSOMNIA 07/28/2008  . HYPERTENSION 04/17/2007  . Impaired glucose tolerance 03/23/2011  . JOINT EFFUSION, LEFT KNEE 06/02/2010  . Loose body in knee 04/01/2009  . Migraines    "stopped 3-4 yr ago" (07/23/2012)  . Morbid obesity ([place]) 04/20/2007  . OTHER DISEASES OF LUNG NOT ELSEWHERE CLASSIFIED 08/01/2007  . Pain in joint, lower leg 04/01/2009    . PERIPHERAL EDEMA 04/21/2009  . Pre-diabetes   . Sarcoidosis ([place]) 09/25/2007  . SHOULDER PAIN, LEFT 04/01/2009  . Sleep apnea    does not use Cpap   Past Surgical History:  Procedure Laterality Date  . BREAST SURGERY     Biopsy benign  . COMBINED MEDIASTINOSCOPY AND BRONCHOSCOPY  08/2007  . COMBINED MEDIASTINOSCOPY AND BRONCHOSCOPY  2009  . FRACTURE SURGERY  ?02/1997   "left upper arm; put rod in" (07/23/2012)  . GUM SURGERY  2000-?2009   "several ORs; soft tissue graft; took material from roof of mouth" (07/23/2012  . KNEE ARTHROSCOPY  03/2004; 06/2009   "right; left" Dr. Theda Sers  . LYMPH NODE BIOPSY  ~ 2009   "for sarcoidosis; don't know exactly which nodes" (07/23/2102)  . MYOMECTOMY  1994   Open  . REFRACTIVE SURGERY  08/1998   "both eyes" (07/23/2012)    reports that she has never smoked. She has never used smokeless tobacco. She reports that she drinks alcohol. She reports that she does not use drugs. family history includes Asthma in her sister; Diabetes in her mother; Heart disease (age of onset: 47) in her father; Hypertension in her father, mother, and sister; Stroke in her mother. Allergies  Allergen Reactions  . Chocolate Other (See Comments)    SOMETIMES CAUSES SEVERE HEADACHES  . Floxin [Ocuflox]     shaky  . Fluoxetine Hcl     (PROZAC) Suicidal thoughts  . Other Nausea And Vomiting    INSTANT ICED  TEA PACKETS  . Sulfonamide Derivatives Rash   Current Outpatient Prescriptions on File Prior to Visit  Medication Sig Dispense Refill  . acetaminophen (TYLENOL) 500 MG tablet Take 500-1,000 mg by mouth every 6 (six) hours as needed (for pain.).    Marland Kitchen albuterol (PROVENTIL HFA;VENTOLIN HFA) 108 (90 Base) MCG/ACT inhaler Inhale 2 puffs into the lungs every 6 (six) hours as needed for wheezing or shortness of breath. 18 g 11  . amphetamine-dextroamphetamine (ADDERALL XR) 30 MG 24 hr capsule Take 1 capsule (30 mg total) by mouth daily. 10 capsule 0  . amphetamine-dextroamphetamine  (ADDERALL) 30 MG tablet Take 30 mg by mouth 2 (two) times daily.    Marland Kitchen antiseptic oral rinse (BIOTENE) LIQD 15 mLs by Mouth Rinse route 2 (two) times daily as needed for dry mouth.    Marland Kitchen aspirin EC 81 MG tablet Take 81 mg by mouth daily.    Marland Kitchen atenolol (TENORMIN) 50 MG tablet TAKE 1 TABLET(50 MG) BY MOUTH DAILY (Patient taking differently: Take 50 mg by mouth at bedtime. ) 90 tablet 3  . calcitonin, salmon, (MIACALCIN/FORTICAL) 200 UNIT/ACT nasal spray Place 1 spray into alternate nostrils daily.    . Carboxymethylcell-Hypromellose (GENTEAL OP) Place 1-2 drops into both eyes 3 (three) times daily as needed (for dry eyes/irritated eyes).    . cloNIDine (CATAPRES) 0.1 MG tablet Take 0.1 mg by mouth 2 (two) times daily.    . clorazepate (TRANXENE) 7.5 MG tablet Take 7.5 mg by mouth 3 (three) times daily.    Marland Kitchen desvenlafaxine (PRISTIQ) 100 MG 24 hr tablet Take 100 mg by mouth at bedtime.    Marland Kitchen diltiazem (DILT-XR) 240 MG 24 hr capsule Take 1 capsule (240 mg total) by mouth daily. (Patient taking differently: Take 240 mg by mouth at bedtime. ) 90 capsule 3  . ferrous sulfate 325 (65 FE) MG tablet Take 325 mg by mouth daily with breakfast.    . furosemide (LASIX) 40 MG tablet Take 1 tablet (40 mg total) by mouth 2 (two) times daily. (Patient taking differently: Take 40 mg by mouth daily. ) 30 tablet 0  . glucosamine-chondroitin 500-400 MG tablet Take 2 tablets by mouth every evening.     . hydrALAZINE (APRESOLINE) 100 MG tablet Take 1 tablet (100 mg total) by mouth every 8 (eight) hours. (Patient taking differently: Take 100 mg by mouth 3 (three) times daily. )    . latanoprost (XALATAN) 0.005 % ophthalmic solution Place 1 drop into both eyes at bedtime.    . Melatonin 5 MG TABS Take 10 mg by mouth at bedtime.    . Multiple Vitamins-Minerals (MULTIVITAMIN WITH MINERALS) tablet Take 1 tablet by mouth daily.    . Omega 3 1200 MG CAPS Take 2,400 mg by mouth at bedtime.    . timolol (TIMOPTIC) 0.5 % ophthalmic  solution Place 1 drop into both eyes 2 (two) times daily.  0  . traMADol (ULTRAM) 50 MG tablet Take 1 tablet (50 mg total) by mouth every 12 (twelve) hours as needed. (Patient taking differently: Take 50 mg by mouth every 12 (twelve) hours as needed (FOR PAIN.). ) 60 tablet 0  . zaleplon (SONATA) 10 MG capsule Take 10 mg by mouth at bedtime as needed for sleep.     No current facility-administered medications on file prior to visit.    Review of Systems Constitutional: Negative for increased diaphoresis, or other activity, appetite or siginficant weight change other than noted HENT: Negative for worsening hearing loss, ear pain,  facial swelling, mouth sores and neck stiffness.   Eyes: Negative for other worsening pain, redness or visual disturbance.  Respiratory: Negative for choking or stridor Cardiovascular: Negative for other chest pain and palpitations.  Gastrointestinal: Negative for worsening diarrhea, blood in stool, or abdominal distention Genitourinary: Negative for hematuria, flank pain or change in urine volume.  Musculoskeletal: Negative for myalgias or other joint complaints.  Skin: Negative for other color change and wound or drainage.  Neurological: Negative for syncope and numbness. other than noted Hematological: Negative for adenopathy. or other swelling Psychiatric/Behavioral: Negative for hallucinations, SI, self-injury, decreased concentration or other worsening agitation.  All other system neg per pt    Objective:   Physical Exam BP 126/86   Pulse (!) 103   Temp 98.2 F (36.8 C)   Ht 5\' 6"  (1.676 m)   Wt 224 lb (101.6 kg)   SpO2 98%   BMI 36.15 kg/m  VS noted,  Constitutional: Pt is oriented to person, place, and time. Appears well-developed and well-nourished, in no significant distress Head: Normocephalic and atraumatic  Eyes: Conjunctivae and EOM are normal. Pupils are equal, round, and reactive to light Right Ear: External ear normal.  Left Ear: External  ear normal Nose: Nose normal.  Mouth/Throat: Oropharynx is clear and moist  Neck: Normal range of motion. Neck supple. No JVD present. No tracheal deviation present or significant neck LA or mass Cardiovascular: Normal rate, regular rhythm, normal heart sounds and intact distal pulses.   Pulmonary/Chest: Effort normal and breath sounds without rales or wheezing  Abdominal: Soft. Bowel sounds are normal. NT. No HSM  Musculoskeletal: Normal range of motion. Exhibits no edema Lymphadenopathy: Has no cervical adenopathy.  Neurological: Pt is alert and oriented to person, place, and time. Pt has normal reflexes. No cranial nerve deficit. Motor grossly intact Skin: Skin is warm and dry. No rash noted or new ulcers Psychiatric:  Has normal mood and affect. Behavior is normal.  Left knee with small effusion, tender and limps to walk, reduced ROM No other exam findings  Lab Results  Component Value Date   WBC 6.1 09/17/2016   HGB 12.8 09/17/2016   HCT 39.3 09/17/2016   PLT 251 09/17/2016   GLUCOSE 113 (H) 09/17/2016   CHOL 157 03/29/2016   TRIG 166.0 (H) 03/29/2016   HDL 46.00 03/29/2016   LDLDIRECT 42.0 09/27/2015   LDLCALC 78 03/29/2016   ALT 26 09/17/2016   AST 29 09/17/2016   NA 137 09/17/2016   K 3.4 (L) 09/17/2016   CL 101 09/17/2016   CREATININE 1.44 (H) 09/17/2016   BUN 22 (H) 09/17/2016   CO2 29 09/17/2016   TSH 4.72 (H) 09/27/2015   INR 0.98 09/17/2016   HGBA1C 5.7 (H) 09/17/2016   MICROALBUR 1.8 09/27/2015       Assessment & Plan:

## 2016-09-22 NOTE — Assessment & Plan Note (Signed)
stable overall by history and exam, recent data reviewed with pt, and pt to continue medical treatment as before,  to f/u any worsening symptoms or concerns Lab Results  Component Value Date   HGBA1C 5.7 (H) 09/17/2016

## 2016-09-22 NOTE — Assessment & Plan Note (Signed)
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
## 2016-09-24 ENCOUNTER — Encounter (HOSPITAL_COMMUNITY): Payer: Self-pay | Admitting: *Deleted

## 2016-09-24 ENCOUNTER — Encounter (HOSPITAL_COMMUNITY)
Admission: RE | Disposition: A | Discharge status: Home Health | Payer: Self-pay | Source: Ambulatory Visit | Attending: Orthopedic Surgery

## 2016-09-24 ENCOUNTER — Inpatient Hospital Stay (HOSPITAL_COMMUNITY): Payer: BLUE CROSS/BLUE SHIELD | Admitting: Anesthesiology

## 2016-09-24 ENCOUNTER — Inpatient Hospital Stay (HOSPITAL_COMMUNITY)
Admission: RE | Admit: 2016-09-24 | Discharge: 2016-09-26 | DRG: 470 | Disposition: A | Discharge status: Home Health | Payer: BLUE CROSS/BLUE SHIELD | Source: Ambulatory Visit | Attending: Orthopedic Surgery | Admitting: Orthopedic Surgery

## 2016-09-24 DIAGNOSIS — Z91018 Allergy to other foods: Secondary | ICD-10-CM | POA: Diagnosis not present

## 2016-09-24 DIAGNOSIS — Z888 Allergy status to other drugs, medicaments and biological substances status: Secondary | ICD-10-CM | POA: Diagnosis not present

## 2016-09-24 DIAGNOSIS — F330 Major depressive disorder, recurrent, mild: Secondary | ICD-10-CM | POA: Diagnosis not present

## 2016-09-24 DIAGNOSIS — Z882 Allergy status to sulfonamides status: Secondary | ICD-10-CM | POA: Diagnosis not present

## 2016-09-24 DIAGNOSIS — Z79899 Other long term (current) drug therapy: Secondary | ICD-10-CM

## 2016-09-24 DIAGNOSIS — Z825 Family history of asthma and other chronic lower respiratory diseases: Secondary | ICD-10-CM | POA: Diagnosis not present

## 2016-09-24 DIAGNOSIS — M25762 Osteophyte, left knee: Secondary | ICD-10-CM | POA: Diagnosis not present

## 2016-09-24 DIAGNOSIS — G43909 Migraine, unspecified, not intractable, without status migrainosus: Secondary | ICD-10-CM | POA: Diagnosis not present

## 2016-09-24 DIAGNOSIS — I129 Hypertensive chronic kidney disease with stage 1 through stage 4 chronic kidney disease, or unspecified chronic kidney disease: Secondary | ICD-10-CM | POA: Diagnosis not present

## 2016-09-24 DIAGNOSIS — Z6836 Body mass index (BMI) 36.0-36.9, adult: Secondary | ICD-10-CM

## 2016-09-24 DIAGNOSIS — M170 Bilateral primary osteoarthritis of knee: Secondary | ICD-10-CM | POA: Diagnosis not present

## 2016-09-24 DIAGNOSIS — Z22321 Carrier or suspected carrier of Methicillin susceptible Staphylococcus aureus: Secondary | ICD-10-CM | POA: Diagnosis not present

## 2016-09-24 DIAGNOSIS — M1712 Unilateral primary osteoarthritis, left knee: Secondary | ICD-10-CM | POA: Diagnosis present

## 2016-09-24 DIAGNOSIS — G8918 Other acute postprocedural pain: Secondary | ICD-10-CM | POA: Diagnosis not present

## 2016-09-24 DIAGNOSIS — Z823 Family history of stroke: Secondary | ICD-10-CM

## 2016-09-24 DIAGNOSIS — F419 Anxiety disorder, unspecified: Secondary | ICD-10-CM | POA: Diagnosis present

## 2016-09-24 DIAGNOSIS — Z833 Family history of diabetes mellitus: Secondary | ICD-10-CM

## 2016-09-24 DIAGNOSIS — E876 Hypokalemia: Secondary | ICD-10-CM | POA: Diagnosis not present

## 2016-09-24 DIAGNOSIS — N184 Chronic kidney disease, stage 4 (severe): Secondary | ICD-10-CM | POA: Diagnosis present

## 2016-09-24 DIAGNOSIS — Z8249 Family history of ischemic heart disease and other diseases of the circulatory system: Secondary | ICD-10-CM | POA: Diagnosis not present

## 2016-09-24 HISTORY — PX: TOTAL KNEE ARTHROPLASTY: SHX125

## 2016-09-24 LAB — TYPE AND SCREEN
ABO/RH(D): O NEG
Antibody Screen: NEGATIVE

## 2016-09-24 LAB — GLUCOSE, CAPILLARY
GLUCOSE-CAPILLARY: 139 mg/dL — AB (ref 65–99)
GLUCOSE-CAPILLARY: 102 mg/dL — AB (ref 65–99)

## 2016-09-24 SURGERY — ARTHROPLASTY, KNEE, TOTAL
Anesthesia: Monitor Anesthesia Care | Laterality: Left

## 2016-09-24 MED ORDER — OXYCODONE HCL 5 MG PO TABS
ORAL_TABLET | ORAL | Status: AC
  Administered 2016-09-24: 10 mg via ORAL
  Filled 2016-09-24: qty 2

## 2016-09-24 MED ORDER — OXYCODONE HCL 5 MG PO TABS
5.0000 mg | ORAL_TABLET | ORAL | Status: DC | PRN
  Administered 2016-09-24 – 2016-09-26 (×7): 10 mg via ORAL
  Filled 2016-09-24 (×6): qty 2

## 2016-09-24 MED ORDER — PROMETHAZINE HCL 25 MG/ML IJ SOLN
6.2500 mg | INTRAMUSCULAR | Status: DC | PRN
  Administered 2016-09-24: 6.25 mg via INTRAVENOUS

## 2016-09-24 MED ORDER — MIDAZOLAM HCL 2 MG/2ML IJ SOLN
INTRAMUSCULAR | Status: AC
  Filled 2016-09-24: qty 2

## 2016-09-24 MED ORDER — GABAPENTIN 300 MG PO CAPS
300.0000 mg | ORAL_CAPSULE | Freq: Two times a day (BID) | ORAL | Status: DC
  Administered 2016-09-24 – 2016-09-26 (×4): 300 mg via ORAL
  Filled 2016-09-24 (×4): qty 1

## 2016-09-24 MED ORDER — DOCUSATE SODIUM 100 MG PO CAPS
100.0000 mg | ORAL_CAPSULE | Freq: Two times a day (BID) | ORAL | Status: DC
  Administered 2016-09-24 – 2016-09-26 (×4): 100 mg via ORAL
  Filled 2016-09-24 (×4): qty 1

## 2016-09-24 MED ORDER — TRANEXAMIC ACID 1000 MG/10ML IV SOLN
1000.0000 mg | Freq: Once | INTRAVENOUS | Status: AC
  Administered 2016-09-24: 1000 mg via INTRAVENOUS
  Filled 2016-09-24: qty 10

## 2016-09-24 MED ORDER — ONDANSETRON HCL 4 MG/2ML IJ SOLN: 4.0000 mg | Freq: Four times a day (QID) | INTRAMUSCULAR | Status: DC | PRN

## 2016-09-24 MED ORDER — ACETAMINOPHEN 500 MG PO TABS
ORAL_TABLET | ORAL | Status: AC
  Administered 2016-09-24: 1000 mg via ORAL
  Filled 2016-09-24: qty 2

## 2016-09-24 MED ORDER — PROPOFOL 10 MG/ML IV BOLUS
INTRAVENOUS | Status: AC
  Filled 2016-09-24: qty 20

## 2016-09-24 MED ORDER — FENTANYL CITRATE (PF) 100 MCG/2ML IJ SOLN
INTRAMUSCULAR | Status: AC
  Filled 2016-09-24: qty 2

## 2016-09-24 MED ORDER — TRANEXAMIC ACID 1000 MG/10ML IV SOLN
1000.0000 mg | INTRAVENOUS | Status: AC
  Administered 2016-09-24: 1000 mg via INTRAVENOUS
  Filled 2016-09-24: qty 10

## 2016-09-24 MED ORDER — ACETAMINOPHEN 650 MG RE SUPP: 650.0000 mg | Freq: Four times a day (QID) | RECTAL | Status: DC | PRN

## 2016-09-24 MED ORDER — ASPIRIN EC 325 MG PO TBEC: 325.0000 mg | DELAYED_RELEASE_TABLET | Freq: Two times a day (BID) | ORAL | 0 refills | Status: DC

## 2016-09-24 MED ORDER — KETOROLAC TROMETHAMINE 30 MG/ML IJ SOLN
INTRAMUSCULAR | Status: AC
  Administered 2016-09-24: 30 mg via INTRAVENOUS
  Filled 2016-09-24: qty 1

## 2016-09-24 MED ORDER — MIDAZOLAM HCL 2 MG/2ML IJ SOLN
2.0000 mg | Freq: Once | INTRAMUSCULAR | Status: AC
  Administered 2016-09-24: 2 mg via INTRAVENOUS

## 2016-09-24 MED ORDER — HYDROMORPHONE HCL 2 MG/ML IJ SOLN
0.5000 mg | INTRAMUSCULAR | Status: DC | PRN
Start: 2016-09-24 — End: 2016-09-26

## 2016-09-24 MED ORDER — LACTATED RINGERS IV SOLN
INTRAVENOUS | Status: DC
  Administered 2016-09-24: 12:00:00 via INTRAVENOUS

## 2016-09-24 MED ORDER — DEXAMETHASONE SODIUM PHOSPHATE 10 MG/ML IJ SOLN
10.0000 mg | Freq: Two times a day (BID) | INTRAMUSCULAR | Status: AC
  Administered 2016-09-24 – 2016-09-25 (×3): 10 mg via INTRAVENOUS
  Filled 2016-09-24 (×3): qty 1

## 2016-09-24 MED ORDER — CEFAZOLIN SODIUM-DEXTROSE 2-4 GM/100ML-% IV SOLN
INTRAVENOUS | Status: AC
  Filled 2016-09-24: qty 100

## 2016-09-24 MED ORDER — PHENYLEPHRINE HCL 10 MG/ML IJ SOLN
INTRAVENOUS | Status: DC | PRN
  Administered 2016-09-24: 25 ug/min via INTRAVENOUS

## 2016-09-24 MED ORDER — ONDANSETRON HCL 4 MG PO TABS: 4.0000 mg | ORAL_TABLET | Freq: Four times a day (QID) | ORAL | Status: DC | PRN

## 2016-09-24 MED ORDER — ACETAMINOPHEN 325 MG PO TABS: 650.0000 mg | ORAL_TABLET | Freq: Four times a day (QID) | ORAL | Status: DC | PRN

## 2016-09-24 MED ORDER — BUPIVACAINE LIPOSOME 1.3 % IJ SUSP
20.0000 mL | INTRAMUSCULAR | Status: AC
  Administered 2016-09-24: 20 mL
  Filled 2016-09-24: qty 20

## 2016-09-24 MED ORDER — BUPIVACAINE HCL (PF) 0.5 % IJ SOLN
INTRAMUSCULAR | Status: DC | PRN
  Administered 2016-09-24: 20 mL

## 2016-09-24 MED ORDER — FENTANYL CITRATE (PF) 100 MCG/2ML IJ SOLN
INTRAMUSCULAR | Status: AC
  Administered 2016-09-24: 100 ug via INTRAVENOUS
  Filled 2016-09-24: qty 2

## 2016-09-24 MED ORDER — CLONIDINE HCL 0.1 MG PO TABS
0.1000 mg | ORAL_TABLET | Freq: Two times a day (BID) | ORAL | Status: DC
  Administered 2016-09-24 – 2016-09-26 (×4): 0.1 mg via ORAL
  Filled 2016-09-24 (×4): qty 1

## 2016-09-24 MED ORDER — POLYETHYLENE GLYCOL 3350 17 G PO PACK: 17.0000 g | PACK | Freq: Every day | ORAL | Status: DC | PRN

## 2016-09-24 MED ORDER — OXYCODONE-ACETAMINOPHEN 5-325 MG PO TABS: 1.0000 | ORAL_TABLET | ORAL | 0 refills | Status: DC | PRN

## 2016-09-24 MED ORDER — ATENOLOL 50 MG PO TABS
50.0000 mg | ORAL_TABLET | Freq: Every day | ORAL | Status: DC
  Administered 2016-09-24 – 2016-09-25 (×2): 50 mg via ORAL
  Filled 2016-09-24 (×2): qty 1

## 2016-09-24 MED ORDER — HYDROMORPHONE HCL 1 MG/ML IJ SOLN: 0.5000 mg | INTRAMUSCULAR | Status: DC | PRN

## 2016-09-24 MED ORDER — MIDAZOLAM HCL 2 MG/2ML IJ SOLN
INTRAMUSCULAR | Status: AC
  Administered 2016-09-24: 2 mg via INTRAVENOUS
  Filled 2016-09-24: qty 2

## 2016-09-24 MED ORDER — SODIUM CHLORIDE 0.9 % IV SOLN
INTRAVENOUS | Status: DC
  Administered 2016-09-24 – 2016-09-25 (×2): via INTRAVENOUS

## 2016-09-24 MED ORDER — CLORAZEPATE DIPOTASSIUM 3.75 MG PO TABS
7.5000 mg | ORAL_TABLET | Freq: Three times a day (TID) | ORAL | Status: DC
  Administered 2016-09-24 – 2016-09-26 (×5): 7.5 mg via ORAL
  Filled 2016-09-24 (×5): qty 2

## 2016-09-24 MED ORDER — HYDROMORPHONE HCL 1 MG/ML IJ SOLN: 0.2500 mg | INTRAMUSCULAR | Status: DC | PRN

## 2016-09-24 MED ORDER — CEFAZOLIN SODIUM-DEXTROSE 2-4 GM/100ML-% IV SOLN
2.0000 g | INTRAVENOUS | Status: AC
  Administered 2016-09-24: 2 g via INTRAVENOUS

## 2016-09-24 MED ORDER — DOCUSATE SODIUM 100 MG PO CAPS: 100.0000 mg | ORAL_CAPSULE | Freq: Two times a day (BID) | ORAL | 0 refills | Status: DC

## 2016-09-24 MED ORDER — HYDRALAZINE HCL 50 MG PO TABS
100.0000 mg | ORAL_TABLET | Freq: Three times a day (TID) | ORAL | Status: DC
  Administered 2016-09-24 – 2016-09-26 (×6): 100 mg via ORAL
  Filled 2016-09-24 (×6): qty 2

## 2016-09-24 MED ORDER — TIMOLOL MALEATE 0.5 % OP SOLN
1.0000 [drp] | Freq: Two times a day (BID) | OPHTHALMIC | Status: DC
  Administered 2016-09-24 – 2016-09-26 (×3): 1 [drp] via OPHTHALMIC
  Filled 2016-09-24 (×2): qty 5

## 2016-09-24 MED ORDER — ALUM & MAG HYDROXIDE-SIMETH 200-200-20 MG/5ML PO SUSP
30.0000 mL | ORAL | Status: DC | PRN
Start: 2016-09-24 — End: 2016-09-26

## 2016-09-24 MED ORDER — 0.9 % SODIUM CHLORIDE (POUR BTL) OPTIME
TOPICAL | Status: DC | PRN
  Administered 2016-09-24: 1000 mL

## 2016-09-24 MED ORDER — ALBUTEROL SULFATE (2.5 MG/3ML) 0.083% IN NEBU: 3.0000 mL | INHALATION_SOLUTION | Freq: Four times a day (QID) | RESPIRATORY_TRACT | Status: DC | PRN

## 2016-09-24 MED ORDER — ALBUMIN HUMAN 5 % IV SOLN
12.5000 g | Freq: Once | INTRAVENOUS | Status: AC
  Administered 2016-09-24: 12.5 g via INTRAVENOUS

## 2016-09-24 MED ORDER — ASPIRIN EC 325 MG PO TBEC
325.0000 mg | DELAYED_RELEASE_TABLET | Freq: Two times a day (BID) | ORAL | Status: DC
  Administered 2016-09-25 – 2016-09-26 (×3): 325 mg via ORAL
  Filled 2016-09-24 (×3): qty 1

## 2016-09-24 MED ORDER — BISACODYL 5 MG PO TBEC: 5.0000 mg | DELAYED_RELEASE_TABLET | Freq: Every day | ORAL | Status: DC | PRN

## 2016-09-24 MED ORDER — CEFAZOLIN SODIUM-DEXTROSE 2-4 GM/100ML-% IV SOLN
2.0000 g | Freq: Four times a day (QID) | INTRAVENOUS | Status: AC
  Administered 2016-09-24 – 2016-09-25 (×2): 2 g via INTRAVENOUS
  Filled 2016-09-24 (×2): qty 100

## 2016-09-24 MED ORDER — ACETAMINOPHEN 500 MG PO TABS
1000.0000 mg | ORAL_TABLET | Freq: Once | ORAL | Status: AC
  Administered 2016-09-24: 1000 mg via ORAL

## 2016-09-24 MED ORDER — SODIUM CHLORIDE 0.9 % IJ SOLN
INTRAMUSCULAR | Status: DC | PRN
  Administered 2016-09-24: 10 mL

## 2016-09-24 MED ORDER — DEXTROSE 5 % IV SOLN
500.0000 mg | Freq: Four times a day (QID) | INTRAVENOUS | Status: DC | PRN
  Filled 2016-09-24: qty 5

## 2016-09-24 MED ORDER — BUPIVACAINE HCL (PF) 0.5 % IJ SOLN
INTRAMUSCULAR | Status: AC
  Filled 2016-09-24: qty 30

## 2016-09-24 MED ORDER — DIPHENHYDRAMINE HCL 12.5 MG/5ML PO ELIX: 12.5000 mg | ORAL_SOLUTION | ORAL | Status: DC | PRN

## 2016-09-24 MED ORDER — PROMETHAZINE HCL 25 MG/ML IJ SOLN
INTRAMUSCULAR | Status: AC
  Administered 2016-09-24: 6.25 mg via INTRAVENOUS
  Filled 2016-09-24: qty 1

## 2016-09-24 MED ORDER — METHOCARBAMOL 750 MG PO TABS: 750.0000 mg | ORAL_TABLET | Freq: Three times a day (TID) | ORAL | 0 refills | Status: DC | PRN

## 2016-09-24 MED ORDER — PROPOFOL 500 MG/50ML IV EMUL
INTRAVENOUS | Status: DC | PRN
  Administered 2016-09-24: 100 ug/kg/min via INTRAVENOUS

## 2016-09-24 MED ORDER — METHOCARBAMOL 500 MG PO TABS
500.0000 mg | ORAL_TABLET | Freq: Four times a day (QID) | ORAL | Status: DC | PRN
  Administered 2016-09-24 – 2016-09-26 (×4): 500 mg via ORAL
  Filled 2016-09-24 (×4): qty 1

## 2016-09-24 MED ORDER — LATANOPROST 0.005 % OP SOLN
1.0000 [drp] | Freq: Every day | OPHTHALMIC | Status: DC
  Administered 2016-09-24 – 2016-09-26 (×2): 1 [drp] via OPHTHALMIC
  Filled 2016-09-24 (×2): qty 2.5

## 2016-09-24 MED ORDER — CHLORHEXIDINE GLUCONATE 4 % EX LIQD: 60.0000 mL | Freq: Once | CUTANEOUS | Status: DC

## 2016-09-24 MED ORDER — PROMETHAZINE HCL 25 MG/ML IJ SOLN: 6.2500 mg | Freq: Four times a day (QID) | INTRAMUSCULAR | Status: DC | PRN

## 2016-09-24 MED ORDER — EPHEDRINE 5 MG/ML INJ
INTRAVENOUS | Status: AC
  Filled 2016-09-24: qty 10

## 2016-09-24 MED ORDER — LIDOCAINE 2% (20 MG/ML) 5 ML SYRINGE
INTRAMUSCULAR | Status: AC
  Filled 2016-09-24: qty 5

## 2016-09-24 MED ORDER — FENTANYL CITRATE (PF) 100 MCG/2ML IJ SOLN
100.0000 ug | Freq: Once | INTRAMUSCULAR | Status: AC
  Administered 2016-09-24: 100 ug via INTRAVENOUS

## 2016-09-24 MED ORDER — DESVENLAFAXINE SUCCINATE ER 100 MG PO TB24
100.0000 mg | ORAL_TABLET | Freq: Every day | ORAL | Status: DC
  Administered 2016-09-24 – 2016-09-25 (×2): 100 mg via ORAL
  Filled 2016-09-24 (×3): qty 1

## 2016-09-24 MED ORDER — MEPERIDINE HCL 25 MG/ML IJ SOLN: 6.2500 mg | INTRAMUSCULAR | Status: DC | PRN

## 2016-09-24 MED ORDER — FUROSEMIDE 40 MG PO TABS
40.0000 mg | ORAL_TABLET | Freq: Every day | ORAL | Status: DC
  Administered 2016-09-25 – 2016-09-26 (×2): 40 mg via ORAL
  Filled 2016-09-24 (×2): qty 1

## 2016-09-24 MED ORDER — AMPHETAMINE-DEXTROAMPHETAMINE 10 MG PO TABS
30.0000 mg | ORAL_TABLET | Freq: Two times a day (BID) | ORAL | Status: DC
  Administered 2016-09-25 – 2016-09-26 (×2): 30 mg via ORAL
  Filled 2016-09-24 (×3): qty 3

## 2016-09-24 MED ORDER — SODIUM CHLORIDE 0.9 % IR SOLN
Status: DC | PRN
  Administered 2016-09-24: 3000 mL

## 2016-09-24 MED ORDER — ALBUMIN HUMAN 5 % IV SOLN
INTRAVENOUS | Status: AC
  Administered 2016-09-24: 12.5 g via INTRAVENOUS
  Filled 2016-09-24: qty 250

## 2016-09-24 MED ORDER — METHOCARBAMOL 500 MG PO TABS
ORAL_TABLET | ORAL | Status: AC
  Administered 2016-09-24: 500 mg via ORAL
  Filled 2016-09-24: qty 1

## 2016-09-24 MED ORDER — KETOROLAC TROMETHAMINE 30 MG/ML IJ SOLN
30.0000 mg | Freq: Once | INTRAMUSCULAR | Status: AC
  Administered 2016-09-24: 30 mg via INTRAVENOUS

## 2016-09-24 MED ORDER — MAGNESIUM CITRATE PO SOLN: 1.0000 | Freq: Once | ORAL | Status: DC | PRN

## 2016-09-24 MED ORDER — EPHEDRINE SULFATE-NACL 50-0.9 MG/10ML-% IV SOSY
PREFILLED_SYRINGE | INTRAVENOUS | Status: DC | PRN
  Administered 2016-09-24 (×4): 5 mg via INTRAVENOUS

## 2016-09-24 SURGICAL SUPPLY — 63 items
APL SKNCLS STERI-STRIP NONHPOA (GAUZE/BANDAGES/DRESSINGS) ×1
BANDAGE ESMARK 6X9 LF (GAUZE/BANDAGES/DRESSINGS) ×1 IMPLANT
BENZOIN TINCTURE PRP APPL 2/3 (GAUZE/BANDAGES/DRESSINGS) ×2 IMPLANT
BLADE SAGITTAL 25.0X1.19X90 (BLADE) ×2 IMPLANT
BLADE SAW SAG 90X13X1.27 (BLADE) ×2 IMPLANT
BNDG CMPR 9X6 STRL LF SNTH (GAUZE/BANDAGES/DRESSINGS) ×1
BNDG ESMARK 6X9 LF (GAUZE/BANDAGES/DRESSINGS) ×2
BOWL SMART MIX CTS (DISPOSABLE) ×2 IMPLANT
CAPT KNEE TOTAL 3 ATTUNE ×1 IMPLANT
CEMENT HV SMART SET (Cement) ×4 IMPLANT
CLSR STERI-STRIP ANTIMIC 1/2X4 (GAUZE/BANDAGES/DRESSINGS) ×1 IMPLANT
COVER SURGICAL LIGHT HANDLE (MISCELLANEOUS) ×3 IMPLANT
CUFF TOURNIQUET SINGLE 34IN LL (TOURNIQUET CUFF) ×2 IMPLANT
CUFF TOURNIQUET SINGLE 44IN (TOURNIQUET CUFF) IMPLANT
DRAPE EXTREMITY T 121X128X90 (DRAPE) ×2 IMPLANT
DRAPE U-SHAPE 47X51 STRL (DRAPES) ×2 IMPLANT
DRSG AQUACEL AG ADV 3.5X10 (GAUZE/BANDAGES/DRESSINGS) IMPLANT
DRSG PAD ABDOMINAL 8X10 ST (GAUZE/BANDAGES/DRESSINGS) ×2 IMPLANT
DURAPREP 26ML APPLICATOR (WOUND CARE) ×2 IMPLANT
ELECT REM PT RETURN 9FT ADLT (ELECTROSURGICAL) ×2
ELECTRODE REM PT RTRN 9FT ADLT (ELECTROSURGICAL) ×1 IMPLANT
EVACUATOR 1/8 PVC DRAIN (DRAIN) IMPLANT
FACESHIELD WRAPAROUND (MASK) ×2 IMPLANT
FACESHIELD WRAPAROUND OR TEAM (MASK) ×1 IMPLANT
GAUZE SPONGE 4X4 12PLY STRL (GAUZE/BANDAGES/DRESSINGS) ×2 IMPLANT
GAUZE SPONGE 4X4 16PLY XRAY LF (GAUZE/BANDAGES/DRESSINGS) ×1 IMPLANT
GLOVE BIOGEL PI IND STRL 8 (GLOVE) ×2 IMPLANT
GLOVE BIOGEL PI INDICATOR 8 (GLOVE) ×2
GLOVE ECLIPSE 7.5 STRL STRAW (GLOVE) ×4 IMPLANT
GOWN STRL REUS W/ TWL LRG LVL3 (GOWN DISPOSABLE) ×1 IMPLANT
GOWN STRL REUS W/ TWL XL LVL3 (GOWN DISPOSABLE) ×2 IMPLANT
GOWN STRL REUS W/TWL LRG LVL3 (GOWN DISPOSABLE) ×2
GOWN STRL REUS W/TWL XL LVL3 (GOWN DISPOSABLE) ×4
HANDPIECE INTERPULSE COAX TIP (DISPOSABLE) ×2
HOOD PEEL AWAY FACE SHEILD DIS (HOOD) ×4 IMPLANT
IMMOBILIZER KNEE 22 (SOFTGOODS) ×1 IMPLANT
IMMOBILIZER KNEE 22 UNIV (SOFTGOODS) ×2 IMPLANT
KIT BASIN OR (CUSTOM PROCEDURE TRAY) ×2 IMPLANT
KIT ROOM TURNOVER OR (KITS) ×2 IMPLANT
MANIFOLD NEPTUNE II (INSTRUMENTS) ×2 IMPLANT
NEEDLE 22X1 1/2 (OR ONLY) (NEEDLE) ×2 IMPLANT
NS IRRIG 1000ML POUR BTL (IV SOLUTION) ×2 IMPLANT
PACK TOTAL JOINT (CUSTOM PROCEDURE TRAY) ×2 IMPLANT
PAD ARMBOARD 7.5X6 YLW CONV (MISCELLANEOUS) ×4 IMPLANT
SET HNDPC FAN SPRY TIP SCT (DISPOSABLE) ×1 IMPLANT
STAPLER VISISTAT 35W (STAPLE) IMPLANT
STRIP CLOSURE SKIN 1/2X4 (GAUZE/BANDAGES/DRESSINGS) IMPLANT
SUCTION FRAZIER HANDLE 10FR (MISCELLANEOUS) ×1
SUCTION TUBE FRAZIER 10FR DISP (MISCELLANEOUS) ×1 IMPLANT
SUT MNCRL AB 3-0 PS2 18 (SUTURE) ×1 IMPLANT
SUT VIC AB 0 CT1 27 (SUTURE) ×2
SUT VIC AB 0 CT1 27XBRD ANBCTR (SUTURE) IMPLANT
SUT VIC AB 0 CTB1 27 (SUTURE) ×3 IMPLANT
SUT VIC AB 1 CT1 27 (SUTURE) ×4
SUT VIC AB 1 CT1 27XBRD ANBCTR (SUTURE) ×2 IMPLANT
SUT VIC AB 2-0 CTB1 (SUTURE) ×4 IMPLANT
SYR 50ML LL SCALE MARK (SYRINGE) ×2 IMPLANT
TOWEL OR 17X24 6PK STRL BLUE (TOWEL DISPOSABLE) ×2 IMPLANT
TOWEL OR 17X26 10 PK STRL BLUE (TOWEL DISPOSABLE) ×2 IMPLANT
TRAY CATH 16FR W/PLASTIC CATH (SET/KITS/TRAYS/PACK) IMPLANT
TRAY FOLEY W/METER SILVER 16FR (SET/KITS/TRAYS/PACK) IMPLANT
WRAP KNEE MAXI GEL POST OP (GAUZE/BANDAGES/DRESSINGS) ×2 IMPLANT
YANKAUER SUCT BULB TIP NO VENT (SUCTIONS) ×1 IMPLANT

## 2016-09-24 NOTE — Anesthesia Postprocedure Evaluation (Addendum)
Anesthesia Post Note  Patient: Tara [name]  Procedure(s) Performed: Procedure(s) (LRB): TOTAL KNEE ARTHROPLASTY (Left)  Patient location during evaluation: PACU Anesthesia Type: Regional Level of consciousness: awake Pain management: pain level controlled Vital Signs Assessment: post-procedure vital signs reviewed and stable Respiratory status: spontaneous breathing Cardiovascular status: stable Postop Assessment: no headache, no backache, spinal receding, patient able to bend at knees and no signs of nausea or vomiting Anesthetic complications: no        Last Vitals:  Vitals:   09/24/16 1610 09/24/16 1620  BP: (!) 85/53 (!) 106/56  Pulse: (!) 55 (!) 53  Resp: 13 11  Temp:      Last Pain:  Vitals:   09/24/16 1214  TempSrc: Oral   Pain Goal:                 Dhruv Christina JR,JOHN Lukis Bunt

## 2016-09-24 NOTE — Anesthesia Procedure Notes (Signed)
Anesthesia Procedure Image    

## 2016-09-24 NOTE — Anesthesia Preprocedure Evaluation (Addendum)
Anesthesia Evaluation  Patient identified by MRN, date of birth, ID band Patient awake    Reviewed: Allergy & Precautions, NPO status , Patient's Chart, lab work & pertinent test results  Airway Mallampati: II       Dental no notable dental hx. (+) Missing,    Pulmonary    Pulmonary exam normal        Cardiovascular hypertension, Pt. on home beta blockers and Pt. on medications Normal cardiovascular exam     Neuro/Psych    GI/Hepatic negative GI ROS, Neg liver ROS,   Endo/Other    Renal/GU   negative genitourinary   Musculoskeletal   Abdominal (+) + obese,   Peds  Hematology   Anesthesia Other Findings   Reproductive/Obstetrics                            Anesthesia Physical Anesthesia Plan  ASA: II  Anesthesia Plan: Spinal   Post-op Pain Management:    Induction:   Airway Management Planned: Natural Airway, Nasal Cannula and Simple Face Mask  Additional Equipment:   Intra-op Plan:   Post-operative Plan:   Informed Consent: I have reviewed the patients History and Physical, chart, labs and discussed the procedure including the risks, benefits and alternatives for the proposed anesthesia with the patient or authorized representative who has indicated his/her understanding and acceptance.     Plan Discussed with: CRNA and Surgeon  Anesthesia Plan Comments:        Anesthesia Quick Evaluation

## 2016-09-24 NOTE — Anesthesia Procedure Notes (Signed)
Procedure Name: MAC Date/Time: 09/24/2016 2:00 PM Performed by: Rush Farmer E Pre-anesthesia Checklist: Patient identified, Emergency Drugs available, Suction available and Patient being monitored Oxygen Delivery Method: Simple face mask Intubation Type: IV induction Placement Confirmation: positive ETCO2

## 2016-09-24 NOTE — Brief Op Note (Signed)
09/24/2016  3:31 PM  PATIENT:  [name]  66 y.o. female  PRE-OPERATIVE DIAGNOSIS:  OSTEOARTHRITIS LEFT KNEE  POST-OPERATIVE DIAGNOSIS:  OSTEOARTHRITIS LEFT KNEE  PROCEDURE:  Procedure(s): TOTAL KNEE ARTHROPLASTY (Left)  SURGEON:  Surgeon(s) and Role:    * Dorna Leitz, MD - Primary  PHYSICIAN ASSISTANT:   ASSISTANTS: bethune   ANESTHESIA:   spinal  EBL:  Total I/O In: -  Out: 100 [Blood:100]  BLOOD ADMINISTERED:none  DRAINS: none   LOCAL MEDICATIONS USED:  MARCAINE    and OTHER experel  SPECIMEN:  No Specimen  DISPOSITION OF SPECIMEN:  N/A  COUNTS:  YES  TOURNIQUET:   Total Tourniquet Time Documented: Thigh (Left) - 62 minutes Total: Thigh (Left) - 62 minutes   DICTATION: .Other Dictation: Dictation Number [id number]  PLAN OF CARE: Admit to inpatient   PATIENT DISPOSITION:  PACU - hemodynamically stable.   Delay start of Pharmacological VTE agent (>24hrs) due to surgical blood loss or risk of bleeding: no

## 2016-09-24 NOTE — Transfer of Care (Addendum)
Immediate Anesthesia Transfer of Care Note  Patient: Mystery Schrupp  Procedure(s) Performed: Procedure(s): TOTAL KNEE ARTHROPLASTY (Left)  Patient Location: PACU  Anesthesia Type:MAC, Regional and Spinal  Level of Consciousness: awake, alert , oriented and patient cooperative  Airway & Oxygen Therapy: Patient Spontanous Breathing and Patient connected to nasal cannula oxygen  Post-op Assessment: Report given to RN and Post -op Vital signs reviewed and stable  BP 85/53.  Albumin started.  Dr hatchett aware and at bedside.  Patient awake, alert, and discussing the weather   Post vital signs: Reviewed and stable  Last Vitals:  Vitals:   09/24/16 1308 09/24/16 1309  BP:    Pulse: (!) 56 (!) 55  Resp: 15 14  Temp:      Last Pain:  Vitals:   09/24/16 1214  TempSrc: Oral         Complications: No apparent anesthesia complications

## 2016-09-24 NOTE — Anesthesia Procedure Notes (Signed)
Spinal  Patient location during procedure: OR Start time: 09/24/2016 1:55 PM End time: 09/24/2016 1:59 PM Staffing Anesthesiologist: Lyn Hollingshead Performed: anesthesiologist  Preanesthetic Checklist Completed: patient identified, surgical consent, pre-op evaluation, timeout performed, IV checked, risks and benefits discussed and monitors and equipment checked Spinal Block Patient position: sitting Prep: site prepped and draped and DuraPrep Patient monitoring: heart rate, cardiac monitor, continuous pulse ox and blood pressure Approach: midline Location: L3-4 Injection technique: single-shot Needle Needle type: Pencan  Needle gauge: 24 G Needle length: 9 cm Needle insertion depth: 6 cm Assessment Sensory level: T8

## 2016-09-24 NOTE — Anesthesia Procedure Notes (Addendum)
Anesthesia Regional Block: Adductor canal block   Pre-Anesthetic Checklist: ,, timeout performed, Correct Patient, Correct Site, Correct Laterality, Correct Procedure, Correct Position, site marked, Risks and benefits discussed,  Surgical consent,  Pre-op evaluation,  At surgeon's request and post-op pain management  Laterality: Left and Upper  Prep: chloraprep       Needles:  Injection technique: Single-shot  Needle Type: Echogenic Stimulator Needle     Needle Length: 9cm  Needle Gauge: 21   Needle insertion depth: 6 cm   Additional Needles:   Procedures: ultrasound guided,,,,,,,,  Narrative:  Start time: 09/24/2016 12:55 PM End time: 09/24/2016 1:05 PM Injection made incrementally with aspirations every 5 mL.  Performed by: Personally  Anesthesiologist: Lyn Hollingshead

## 2016-09-24 NOTE — Discharge Instructions (Signed)

## 2016-09-24 NOTE — Progress Notes (Signed)
Orthopedic Tech Progress Note Patient Details:  Tara [name] 08/11/1950 [id number]  CPM Left Knee CPM Left Knee: On Left Knee Flexion (Degrees): 90 Left Knee Extension (Degrees): 0 Additional Comments: Applied cpm on left knee/leg at 0-90 degrees.  Pt tolerated well.  (Left Knee/leg)   Kristopher Oppenheim 09/24/2016, 4:46 PM

## 2016-09-25 ENCOUNTER — Encounter (HOSPITAL_COMMUNITY): Payer: Self-pay | Admitting: Orthopedic Surgery

## 2016-09-25 LAB — CBC
HCT: 34.2 % — ABNORMAL LOW (ref 36.0–46.0)
Hemoglobin: 11 g/dL — ABNORMAL LOW (ref 12.0–15.0)
MCH: 27.3 pg (ref 26.0–34.0)
MCHC: 32.2 g/dL (ref 30.0–36.0)
MCV: 84.9 fL (ref 78.0–100.0)
Platelets: 198 10*3/uL (ref 150–400)
RBC: 4.03 MIL/uL (ref 3.87–5.11)
RDW: 15.3 % (ref 11.5–15.5)
WBC: 9.3 10*3/uL (ref 4.0–10.5)

## 2016-09-25 LAB — BASIC METABOLIC PANEL
Anion gap: 7 (ref 5–15)
BUN: 26 mg/dL — AB (ref 6–20)
CO2: 26 mmol/L (ref 22–32)
CREATININE: 1.64 mg/dL — AB (ref 0.44–1.00)
Calcium: 9.6 mg/dL (ref 8.9–10.3)
Chloride: 106 mmol/L (ref 101–111)
GFR calc Af Amer: 37 mL/min — ABNORMAL LOW (ref >60)
GFR calc non Af Amer: 32 mL/min — ABNORMAL LOW (ref >60)
GLUCOSE: 200 mg/dL — AB (ref 65–99)
Potassium: 4.3 mmol/L (ref 3.5–5.1)
Sodium: 139 mmol/L (ref 135–145)
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
## 2016-09-25 NOTE — Progress Notes (Signed)
Subjective: 1 Day Post-Op Procedure(s) (LRB): TOTAL KNEE ARTHROPLASTY (Left) Patient reports pain as mild. Taking by mouth. Patient was up to the bathroom this morning.   Objective: Vital signs in last 24 hours: Temp:  [97.3 F (36.3 C)-97.9 F (36.6 C)] 97.8 F (36.6 C) (03/13 0517) Pulse Rate:  [52-73] 73 (03/13 0517) Resp:  [6-18] 16 (03/13 0517) BP: (85-160)/(40-91) 160/67 (03/13 0517) SpO2:  [93 %-100 %] 99 % (03/13 0517) Weight:  [101.6 kg (224 lb)] 101.6 kg (224 lb) (03/12 1214)  Intake/Output from previous day: 03/12 0701 - 03/13 0700 In: 800 [I.V.:800] Out: 900 [Urine:800; Blood:100] Intake/Output this shift: Total I/O In: 600 [P.O.:600] Out: -    Recent Labs  09/25/16 0441  HGB 11.0*    Recent Labs  09/25/16 0441  WBC 9.3  RBC 4.03  HCT 34.2*  PLT 198    Recent Labs  09/25/16 0441  NA 139  K 4.3  CL 106  CO2 26  BUN 26*  CREATININE 1.64*  GLUCOSE 200*  CALCIUM 9.6   No results for input(s): LABPT, INR in the last 72 hours. Left knee exam: Neurovascular intact Sensation intact distally Intact pulses distally Dorsiflexion/Plantar flexion intact Incision: dressing C/D/I Compartment soft  Assessment/Plan: 1 Day Post-Op Procedure(s) (LRB): TOTAL KNEE ARTHROPLASTY (Left) Plan: Aspirin 325 mg twice daily for DVT prophylaxis 1 month. Up with therapy Discharge home with home health tomorrow.  Jannifer Fischler G 09/25/2016, 9:48 AM

## 2016-09-25 NOTE — Progress Notes (Signed)
Inpatient Diabetes Program Recommendations  AACE/ADA: New Consensus Statement on Inpatient Glycemic Control (2015)  Target Ranges:  Prepandial:   less than 140 mg/dL      Peak postprandial:   less than 180 mg/dL (1-2 hours)      Critically ill patients:  140 - 180 mg/dL   Lab Results  Component Value Date   GLUCAP 102 (H) 09/24/2016   HGBA1C 5.7 (H) 09/17/2016   Results for Tara [name], Tara [name] (MRN [id number]) as of 09/25/2016 14:12  Ref. Range 09/25/2016 04:41  Glucose Latest Ref Range: 65 - 99 mg/dL 200 (H)   Review of Glycemic Control  Diabetes history: Steroid induced DM, receiving steroids this admission (3 doses) Outpatient Diabetes medications: None Current orders for Inpatient glycemic control: None  Inpatient Diabetes Program Recommendations:  Please consider moderate correction scale Novolog 0-15 units TIDAC and 0-5 units QHS.  Thank you,  Windy Carina, RN, MSN Diabetes Coordinator Inpatient Diabetes Program [phone] (Team Pager)

## 2016-09-25 NOTE — Op Note (Signed)
NAME:  Tara [name], BORDENAVE                    ACCOUNT NO.:  MEDICAL RECORD NO.:  [id number]  LOCATION:                                 FACILITY:  PHYSICIAN:  Alta Corning, M.D.        DATE OF BIRTH:  DATE OF PROCEDURE:  09/24/2016 DATE OF DISCHARGE:                              OPERATIVE REPORT   PREOPERATIVE DIAGNOSIS:  End-stage degenerative joint disease, left knee with bone-on-bone change.  POSTOPERATIVE DIAGNOSIS:  End-stage degenerative joint disease, left knee with bone-on-bone change.  PROCEDURE:  Left total knee replacement with an Attune system size 5 narrow femur, size 4 tibia, size 5 bridging bearing, and a 38 mm all- polyethylene patella.  SURGEON:  Alta Corning, M.D.  ASSISTANT:  Gary Fleet, P.A.  ANESTHESIA:  General.  BRIEF HISTORY:  Ms. Tara [name] is a 66 year old female with a long history of complaints of left knee pain.  She had been treated conservatively for prolonged period of time.  After failure of all conservative care, she was taken to the operating room for left total knee replacement.  X- ray showed bone-on-bone change.  She was having night pain and light activity pain.  DESCRIPTION OF PROCEDURE:  The patient was taken to the operating room. After adequate anesthesia was obtained with general anesthetic, the patient with spinal anesthetic, placed supine on the operating table. Left leg was prepped and draped in the usual sterile fashion.  Following this, the leg was exsanguinated.  Blood pressure tourniquet was inflated to 300 mmHg.  Following this, a midline incision made with subcutaneous tissue down to the extensor mechanism and a medial parapatellar arthrotomy was undertaken.  Following this, medial meniscus removed, retropatellar fat pad, synovium on the anterior aspect of the femur, and medial and lateral meniscus, and anterior and posterior cruciates.  Once this was done, the intramedullary pilot hole was drilled in the femur. A  5-degree valgus inclination was taken with 9 mm of distal bone resection.  This was then sized to a 5.  Anterior and posterior cuts were made, chamfers and box.  Attention then turned towards the tibia, which after osteophyte resection was sized to a 4.  It was drilled and keeled, and a size 4 trial was placed.  Size 5 on the femur was placed. Looks like it is going to be a 5 narrow.  Following this, attention was turned towards the placement of these components and the trial 5 poly was placed.  Excellent range of motion and stability were achieved. Attention was then turned to the patella.  It was cut down to a level of 13 mm and 38 paddle was chosen.  Lugs were drilled.  The patellar button was placed and knee put through a range of motion.  Excellent stability and range of motion were achieved at this point.  Once that was completed, the trials were removed.  The knee was copiously and thoroughly lavaged with pulsatile lavage, irrigation and suctioned dry. The final components were cemented in place; size 4 tibia, size 5 narrow femur, and a 5 mm bridging bearing trial was placed.  A 38 patella was placed and held with  a clamp.  Once this was all done, the knee was held in extension and the cement allowed to completely harden.  Once that was done, the excess bone cement was removed and the tourniquet was let down.  All bleeding was controlled electrocautery and the knee was put through a range of motion.  Stability was checked.  Range of motion and stability were excellent.  Final size 5 poly was opened and placed. Attention at this time was turned towards closure.  Medial parapatellar arthrotomy was closed with 1 Vicryl running, skin with 0 and 2-0 Vicryl and 3-0 Monocryl subcuticular.  Benzoin and Steri-Strips were applied. Sterile compressive dressing was applied, and the patient was taken to the recovery room and was noted to be in satisfactory condition. Estimated blood loss for the  procedure is minimal.     Alta Corning, M.D.     Corliss Skains  D:  09/24/2016  T:  09/24/2016  Job:  [id number]

## 2016-09-25 NOTE — Evaluation (Signed)
Occupational Therapy Evaluation Patient Details Name: Tara [name] MRN: [id number] DOB: 01-27-1951 Today's Date: 09/25/2016    History of Present Illness Admitted for LTKA, WBAT, KI with amb;  has a past medical history of Anemia; ANKLE PAIN, LEFT (04/01/2008); ANXIETY (04/17/2007); Arthritis; Chronic kidney disease; DEPRESSION (04/17/2007); DIZZINESS (08/01/2007); DYSPNEA (08/01/2007);  GLUCOSE INTOLERANCE (08/01/2007); Hypercalcemia due to sarcoidosis (2014);  HYPERSOMNIA (07/28/2008); HYPERTENSION (04/17/2007); Migraines;   PERIPHERAL EDEMA (04/21/2009); Sarcoidosis ([place]) (09/25/2007); SHOULDER PAIN, LEFT (04/01/2009);  (Simultaneous filing. User may not have seen previous data.)   Clinical Impression   PTA, pt lived alone and was independent in ADLs, IADLs, driving, and working. Currently, pt is min guard for ADLs and functional mobility due to impulsivity. Pt demonstrates near baseline function. Will follow up to educate on safety and fall prevention. Recommend dc home with HHOT to increase pt's safety and independence.    Follow Up Recommendations  Home health OT;Supervision/Assistance - 24 hour    Equipment Recommendations  None recommended by OT    Recommendations for Other Services       Precautions / Restrictions Precautions Precautions: Knee (Simultaneous filing. User may not have seen previous data.) Precaution Booklet Issued: Yes (comment) (Simultaneous filing. User may not have seen previous data.) Required Braces or Orthoses: Knee Immobilizer - Left (in orders, not in room; nice, stable knee in stance) Restrictions Weight Bearing Restrictions: Yes LLE Weight Bearing: Weight bearing as tolerated      Mobility Bed Mobility Overal bed mobility: Needs Assistance (Simultaneous filing. User may not have seen previous data.) Bed Mobility: Sit to Supine (Simultaneous filing. User may not have seen previous data.)     Supine to sit: Supervision Sit to supine: Supervision   General  bed mobility comments: Cues for technique (Simultaneous filing. User may not have seen previous data.)  Transfers Overall transfer level: Needs assistance (Simultaneous filing. User may not have seen previous data.) Equipment used: Rolling walker (2 wheeled) (Simultaneous filing. User may not have seen previous data.) Transfers: Sit to/from Stand (Simultaneous filing. User may not have seen previous data.) Sit to Stand: Min guard (Simultaneous filing. User may not have seen previous data.)         General transfer comment: Cues for hand placement and technique (Simultaneous filing. User may not have seen previous data.)    Balance Overall balance assessment: Needs assistance Sitting-balance support: Feet supported Sitting balance-Leahy Scale: Good Sitting balance - Comments: Able to sit and don/doff socks   Standing balance support: Bilateral upper extremity supported Standing balance-Leahy Scale: Poor (approaching Fair)                              ADL Overall ADL's : Needs assistance/impaired Eating/Feeding: Set up;Sitting   Grooming: Wash/dry hands;Min guard;Standing   Upper Body Bathing: Set up;Supervision/ safety;Sitting   Lower Body Bathing: Minimal assistance;Sit to/from stand   Upper Body Dressing : Supervision/safety;Set up;Sitting   Lower Body Dressing: Min guard;Sit to/from stand   Toilet Transfer: Min guard;Regular Toilet;RW;Ambulation   Toileting- Water quality scientist and Hygiene: Set up;Supervision/safety;Sitting/lateral lean       Functional mobility during ADLs: Min guard;Rolling walker General ADL Comments: Pt able to perform ADLS but demonstrated impulsivity; encourage pt to slow down during ADLs     Vision         Perception     Praxis      Pertinent Vitals/Pain Pain Assessment: Faces (Simultaneous filing. User may not have seen previous data.)  Pain Score: 2  Pain Location: L knee (back of knee) Pain Descriptors / Indicators:  Aching;Sore (Simultaneous filing. User may not have seen previous data.) Pain Intervention(s): Monitored during session     Hand Dominance Right   Extremity/Trunk Assessment Upper Extremity Assessment Upper Extremity Assessment: Overall WFL for tasks assessed   Lower Extremity Assessment Lower Extremity Assessment: LLE deficits/detail LLE Deficits / Details: Grossly decr AROM and strength, limited by mild pain postop; Good straight leg raise with minimal quad lag; active flexion to approx 85 deg       Communication Communication Communication: Expressive difficulties (tangential speech)   Cognition Arousal/Alertness: Awake/alert (Simultaneous filing. User may not have seen previous data.) Behavior During Therapy: [facility] for tasks assessed/performed;Impulsive (Simultaneous filing. User may not have seen previous data.) Overall Cognitive Status: No family/caregiver present to determine baseline cognitive functioning (Simultaneous filing. User may not have seen previous data.)                 General Comments: Very distractible and with tangential speech (Simultaneous filing. User may not have seen previous data.)   General Comments       Exercises       Shoulder Instructions      Home Living Family/patient expects to be discharged to:: Private residence Living Arrangements: Alone Available Help at Discharge: Friend(s);Available PRN/intermittently Type of Home: House Home Access: Ramped entrance     Home Layout: One level     Bathroom Shower/Tub: Tub/shower unit Shower/tub characteristics: Curtain Biochemist, clinical: Standard Bathroom Accessibility: No   Home Equipment: Environmental consultant - 2 wheels;Cane - single point;Tub bench;Bedside commode          Prior Functioning/Environment Level of Independence: Independent        Comments: Designer, jewellery        OT Problem List: Decreased activity tolerance;Impaired balance (sitting and/or standing);Pain;Decreased safety  awareness;Decreased knowledge of precautions;Decreased knowledge of use of DME or AE      OT Treatment/Interventions: Self-care/ADL training;Therapeutic exercise;Energy conservation;Therapeutic activities    OT Goals(Current goals can be found in the care plan section) Acute Rehab OT Goals Patient Stated Goal: less knee pain (Simultaneous filing. User may not have seen previous data.) OT Goal Formulation: With patient Time For Goal Achievement: 10/09/16 Potential to Achieve Goals: Good ADL Goals Pt Will Perform Tub/Shower Transfer: with supervision;ambulating;rolling walker;tub bench Additional ADL Goal #1: Pt will inpendently verbalize three fall prevention strategies  OT Frequency: Min 1X/week   Barriers to D/C:            Co-evaluation              End of Session Equipment Utilized During Treatment: Surveyor, mining Communication: Mobility status  Activity Tolerance: Patient tolerated treatment well Patient left: in bed;with call bell/phone within reach;with nursing/sitter in room  OT Visit Diagnosis: Unsteadiness on feet (R26.81);Pain Pain - Right/Left: Left Pain - part of body: Knee                ADL either performed or assessed with clinical judgement  Time: 2694-8546 OT Time Calculation (min): 27 min Charges:  OT General Charges $OT Visit: 1 Procedure OT Evaluation $OT Eval Low Complexity: 1 Procedure OT Treatments $Self Care/Home Management : 8-22 mins G-Codes:     [employer], OTR/L [place] 09/25/2016, 4:22 PM

## 2016-09-25 NOTE — Progress Notes (Signed)
Physical Therapy Treatment Patient Details Name: Tara [name] MRN: [id number] DOB: 08/13/1950 Today's Date: 09/25/2016    History of Present Illness Admitted for LTKA, WBAT, KI with amb;  has a past medical history of Anemia; ANKLE PAIN, LEFT (04/01/2008); ANXIETY (04/17/2007); Arthritis; Chronic kidney disease; DEPRESSION (04/17/2007); DIZZINESS (08/01/2007); DYSPNEA (08/01/2007);  GLUCOSE INTOLERANCE (08/01/2007); Hypercalcemia due to sarcoidosis (2014);  HYPERSOMNIA (07/28/2008); HYPERTENSION (04/17/2007); Migraines;   PERIPHERAL EDEMA (04/21/2009); Sarcoidosis ([place]) (09/25/2007); SHOULDER PAIN, LEFT (04/01/2009);  (Simultaneous filing. User may not have seen previous data.)    PT Comments    Noting improvements in gait smoothness and symmetry; Discussed with OT -- her gait is improving and I anticipate good improvement tomorrow as well; my concern is for safety with ADLs when at home alone, and I am very interested in OT's input    Follow Up Recommendations  SNF;Other (comment) (REALLY wants to dc home; noting improvements; am interested in her performance with OT)     Equipment Recommendations  Rolling walker with 5" wheels;3in1 (PT)    Recommendations for Other Services OT consult     Precautions / Restrictions Precautions Precautions: Knee Precaution Booklet Issued: Yes (comment) (Simultaneous filing. User may not have seen previous data.) Precaution Comments: Pt educated to not allow any pillow or bolster under knee for healing with optimal range of motion. Required Braces or Orthoses: (P) Knee Immobilizer - Left (in orders, not in room; nice, stable knee in stance) Restrictions Weight Bearing Restrictions: (P) Yes LLE Weight Bearing: (P) Weight bearing as tolerated    Mobility  Bed Mobility Overal bed mobility: Needs Assistance (Simultaneous filing. User may not have seen previous data.) Bed Mobility: Sit to Supine (Simultaneous filing. User may not have seen previous data.)     Supine to sit: (P) Supervision Sit to supine: Supervision   General bed mobility comments: Cues for technique (Simultaneous filing. User may not have seen previous data.)  Transfers Overall transfer level: Needs assistance (Simultaneous filing. User may not have seen previous data.) Equipment used: Rolling walker (2 wheeled) (Simultaneous filing. User may not have seen previous data.) Transfers: Sit to/from Stand (Simultaneous filing. User may not have seen previous data.) Sit to Stand: Min guard (Simultaneous filing. User may not have seen previous data.)         General transfer comment: Cues for hand placement and technique (Simultaneous filing. User may not have seen previous data.)  Ambulation/Gait Ambulation/Gait assistance: Min guard   Assistive device: Rolling walker (2 wheeled) Gait Pattern/deviations: Step-through pattern Gait velocity: fast and unsteady, though improved from am session   General Gait Details: Cues to take slow, londer steps and keep RW on the ground; smoother than this am; cues also to activate L quad for stance stability   Stairs            Wheelchair Mobility    Modified Rankin (Stroke Patients Only)       Balance Overall balance assessment: (P) Needs assistance         Standing balance support: (P) Bilateral upper extremity supported Standing balance-Leahy Scale: (P) Poor (approaching Fair)                      Cognition Arousal/Alertness: Awake/alert (Simultaneous filing. User may not have seen previous data.) Behavior During Therapy: [facility] for tasks assessed/performed;Impulsive (Simultaneous filing. User may not have seen previous data.) Overall Cognitive Status: No family/caregiver present to determine baseline cognitive functioning (Simultaneous filing. User may not have seen previous data.)  General Comments: Very distractible and with tangential speech (Simultaneous filing. User may not have seen  previous data.)    Exercises Total Joint Exercises Quad Sets: AROM;Left;10 reps (Simultaneous filing. User may not have seen previous data.) Short Arc Quad: AROM;Left;10 reps Heel Slides: AROM;Left;10 reps (Simultaneous filing. User may not have seen previous data.) Straight Leg Raises: AROM;Left;10 reps (minimal quad lag Simultaneous filing. User may not have seen previous data.) Long Arc Quad: (P) AROM;Left;5 reps    General Comments        Pertinent Vitals/Pain Pain Assessment: Faces (Simultaneous filing. User may not have seen previous data.) Faces Pain Scale: Hurts little more Pain Location: L knee with flexion therex (Simultaneous filing. User may not have seen previous data.) Pain Descriptors / Indicators: Aching;Sore (Simultaneous filing. User may not have seen previous data.) Pain Intervention(s): Monitored during session    Home Living Family/patient expects to be discharged to:: (P) Private residence Living Arrangements: (P) Alone Available Help at Discharge: (P) Friend(s);Available PRN/intermittently Type of Home: (P) House Home Access: (P) Ramped entrance   Home Layout: (P) One level Home Equipment: (P) Walker - 2 wheels;Cane - single point;Tub bench;Bedside commode      Prior Function Level of Independence: (P) Independent      Comments: (P) Financial analyst   PT Goals (current goals can now be found in the care plan section) Acute Rehab PT Goals Patient Stated Goal: less knee pain (Simultaneous filing. User may not have seen previous data.) PT Goal Formulation: With patient Time For Goal Achievement: 10/02/16 Potential to Achieve Goals: Good Progress towards PT goals: Progressing toward goals    Frequency    7X/week      PT Plan Current plan remains appropriate    Co-evaluation             End of Session Equipment Utilized During Treatment: Gait belt Activity Tolerance: Patient tolerated treatment well Patient left: in bed;in CPM;with  call bell/phone within reach Nurse Communication: Mobility status PT Visit Diagnosis: Unsteadiness on feet (R26.81);Pain Pain - Right/Left: Left Pain - part of body: Knee     Time: 1330-1402 PT Time Calculation (min) (ACUTE ONLY): 32 min  Charges:  $Gait Training: 8-22 mins $Therapeutic Exercise: 8-22 mins                    G Codes:       Tara [name] 09/26/16, 3:40 PM  Tara [name], [place] Pager [phone] Office [phone]

## 2016-09-25 NOTE — Evaluation (Signed)
Physical Therapy Evaluation Patient Details Name: Tara [name] MRN: [id number] DOB: 08-Jun-1951 Today's Date: 09/25/2016   History of Present Illness  Admitted for LTKA, WBAT, KI with amb;  has a past medical history of Anemia; ANKLE PAIN, LEFT (04/01/2008); ANXIETY (04/17/2007); Arthritis; Chronic kidney disease; DEPRESSION (04/17/2007); DIZZINESS (08/01/2007); DYSPNEA (08/01/2007);  GLUCOSE INTOLERANCE (08/01/2007); Hypercalcemia due to sarcoidosis (2014);  HYPERSOMNIA (07/28/2008); HYPERTENSION (04/17/2007); Migraines;   PERIPHERAL EDEMA (04/21/2009); Sarcoidosis ([place]) (09/25/2007); SHOULDER PAIN, LEFT (04/01/2009);   Clinical Impression   Pt is s/p TKA resulting in the deficits listed below (see PT Problem List). Noting good ROM and strength L knee; unsteadiness with gait with fast pace; Ms. Tara [name] must be completely independent and safe to dc home with occasional assist from neighbors; At this point, we must consider SNF for post-acute rehab to maximize independence and safety with mobility prior to dc home; Still, pt very much wants to dc home, and we will monitor progress and update recs as needed; Discussed with Tara [name], [place]; Have requested OT consult for ADLs -- which will be pivotal in the decision of Home vs SNF for dc;  Pt will benefit from skilled PT to increase their independence and safety with mobility to allow discharge to the venue listed below.      Follow Up Recommendations SNF;Other (comment) (REALLY wants to dc home; Will monitor for progress; if good progress, will update to home with [name] therapies)    Equipment Recommendations  Rolling walker with 5" wheels;3in1 (PT)    Recommendations for Other Services OT consult (paged PA for OT consult (thanks!))     Precautions / Restrictions Precautions Precautions: Knee Precaution Booklet Issued: Yes (comment) Precaution Comments: Pt educated to not allow any pillow or bolster under knee for healing with optimal range of motion.  Required  Braces or Orthoses: Knee Immobilizer - Left (in orders, not in room; nice, stable knee in stance) Restrictions Weight Bearing Restrictions: Yes LLE Weight Bearing: Weight bearing as tolerated      Mobility  Bed Mobility Overal bed mobility: Needs Assistance Bed Mobility: Supine to Sit     Supine to sit: Supervision     General bed mobility comments: Cues for technique  Transfers Overall transfer level: Needs assistance Equipment used: Rolling walker (2 wheeled) Transfers: Sit to/from Stand Sit to Stand: Min guard         General transfer comment: Cues for hand placement and technique  Ambulation/Gait Ambulation/Gait assistance: Min assist   Assistive device: Rolling walker (2 wheeled) Gait Pattern/deviations: Step-through pattern (emerging) Gait velocity: fast and unsteady   General Gait Details: Cues to activate L quad for stance stability; Cues for smooth RW use as she tends to pick it up; walks impulsively fast, min assist to steady RW and modify fast, unsteady pace to slower, more stable pace, easily distractible; impulsive  Stairs            Wheelchair Mobility    Modified Rankin (Stroke Patients Only)       Balance Overall balance assessment: Needs assistance         Standing balance support: Bilateral upper extremity supported Standing balance-Leahy Scale: Poor (approaching Fair)                               Pertinent Vitals/Pain Pain Assessment: 0-10 Pain Score: 1  Pain Location: L knee Pain Descriptors / Indicators: Aching;Sore Pain Intervention(s): Monitored during session;Repositioned;Patient requesting pain meds-RN notified  Home Living Family/patient expects to be discharged to:: Private residence Living Arrangements: Alone Available Help at Discharge: Friend(s);Available PRN/intermittently Type of Home: House Home Access: Ramped entrance     Home Layout: One level Home Equipment: Walker - 2 wheels;Cane -  single point      Prior Function Level of Independence: Independent         Comments: Nurse, learning disability Dominance   Dominant Hand: Right    Extremity/Trunk Assessment   Upper Extremity Assessment Upper Extremity Assessment: Defer to OT evaluation    Lower Extremity Assessment Lower Extremity Assessment: LLE deficits/detail LLE Deficits / Details: Grossly decr AROM and strength, limited by mild pain postop; Good straight leg raise with minimal quad lag; active flexion to approx 85 deg       Communication   Communication: Expressive difficulties (tangential speech)  Cognition Arousal/Alertness: Awake/alert Behavior During Therapy: WFL for tasks assessed/performed;Impulsive Overall Cognitive Status: No family/caregiver present to determine baseline cognitive functioning                 General Comments: Very distractible and with tangential speech    General Comments      Exercises Total Joint Exercises Quad Sets: AROM;Left;5 reps Heel Slides: AROM;Left;5 reps Straight Leg Raises: AROM;Left;5 reps Long Arc Quad: AROM;Left;5 reps   Assessment/Plan    PT Assessment Patient needs continued PT services  PT Problem List Decreased strength;Decreased range of motion;Decreased activity tolerance;Decreased balance;Decreased mobility;Decreased coordination;Decreased knowledge of use of DME;Decreased safety awareness;Decreased knowledge of precautions;Pain       PT Treatment Interventions DME instruction;Gait training;Functional mobility training;Therapeutic activities;Therapeutic exercise;Patient/family education    PT Goals (Current goals can be found in the Care Plan section)  Acute Rehab PT Goals Patient Stated Goal: less knee pain PT Goal Formulation: With patient Time For Goal Achievement: 10/02/16 Potential to Achieve Goals: Good    Frequency 7X/week   Barriers to discharge Decreased caregiver support Must be completely independent and safe  to dc home    Co-evaluation               End of Session Equipment Utilized During Treatment: Gait belt Activity Tolerance: Patient tolerated treatment well Patient left: in chair;with call bell/phone within reach Nurse Communication: Mobility status PT Visit Diagnosis: Unsteadiness on feet (R26.81);Pain Pain - Right/Left: Left Pain - part of body: Knee         Time: 3888-2800 PT Time Calculation (min) (ACUTE ONLY): 31 min   Charges:   PT Evaluation $PT Eval Low Complexity: 1 Procedure PT Treatments $Gait Training: 8-22 mins   PT G Codes:         [place]ta [place] 09/25/2016, 10:56 AM  Roney Marion, PT  Acute Rehabilitation Services Pager [phone] Office [phone]

## 2016-09-26 ENCOUNTER — Encounter: Payer: Self-pay | Admitting: Family Medicine

## 2016-09-26 ENCOUNTER — Ambulatory Visit: Payer: Self-pay | Admitting: Internal Medicine

## 2016-09-26 LAB — CBC
HEMATOCRIT: 31.4 % — AB (ref 36.0–46.0)
HEMOGLOBIN: 10.1 g/dL — AB (ref 12.0–15.0)
MCH: 26.9 pg (ref 26.0–34.0)
MCHC: 32.2 g/dL (ref 30.0–36.0)
MCV: 83.5 fL (ref 78.0–100.0)
Platelets: 210 10*3/uL (ref 150–400)
RBC: 3.76 MIL/uL — ABNORMAL LOW (ref 3.87–5.11)
RDW: 15 % (ref 11.5–15.5)
WBC: 16.6 10*3/uL — ABNORMAL HIGH (ref 4.0–10.5)

## 2016-09-26 NOTE — Progress Notes (Signed)
Occupational Therapy Treatment Patient Details Name: Tara [name] MRN: [id number] DOB: Dec 24, 1950 Today's Date: 09/26/2016    History of present illness Admitted for LTKA, WBAT, [place] with amb;  has a past medical history of Anemia; ANKLE PAIN, LEFT (04/01/2008); ANXIETY (04/17/2007); Arthritis; Chronic kidney disease; DEPRESSION (04/17/2007); DIZZINESS (08/01/2007); DYSPNEA (08/01/2007);  GLUCOSE INTOLERANCE (08/01/2007); Hypercalcemia due to sarcoidosis (2014);  HYPERSOMNIA (07/28/2008); HYPERTENSION (04/17/2007); Migraines;   PERIPHERAL EDEMA (04/21/2009); Sarcoidosis ([place]) (09/25/2007); SHOULDER PAIN, LEFT (04/01/2009);    OT comments  Pt performed grooming with supervision and gathered all materials with appropriate RW management use of bag on RW. Discussed with pt fall prevention (hand out provided), safety, and performing IADLs at home. Pt verbalized understanding. Will continue to follow to facilitate safe dc home. Continue to recommend HHOT to increase pt safety and independence.    Follow Up Recommendations  Home health OT;Supervision/Assistance - 24 hour    Equipment Recommendations  None recommended by OT    Recommendations for Other Services      Precautions / Restrictions Precautions Precautions: Knee Precaution Comments: Reviewed precautions Required Braces or Orthoses: Knee Immobilizer - Left Restrictions Weight Bearing Restrictions: Yes LLE Weight Bearing: Weight bearing as tolerated       Mobility Bed Mobility Overal bed mobility: Independent       Supine to sit: Independent     General bed mobility comments: Pt performed bed mobility with supervision and HOB lowered to simulate home  Transfers Overall transfer level: Needs assistance Equipment used: Rolling walker (2 wheeled) Transfers: Sit to/from Stand Sit to Stand: Supervision              Balance Overall balance assessment: Needs assistance Sitting-balance support: Feet supported Sitting balance-Leahy  Scale: Good     Standing balance support: Bilateral upper extremity supported;During functional activity Standing balance-Leahy Scale: Good Standing balance comment: Demonstrated good balance to gather materials around room and perform grooming at sink                   ADL Overall ADL's : Needs assistance/impaired     Grooming: Oral care;Wash/dry face;Supervision/safety;Standing Grooming Details (indicate cue type and reason): Pt gathered all materials and performed grooming at sink                             Functional mobility during ADLs: Supervision/safety;Rolling walker General ADL Comments: Pt performs ADLs and funcitonal mobility wuth supervision and VCs to slow down      Vision                     Perception     Praxis      Cognition   Behavior During Therapy: [facility] for tasks assessed/performed;Impulsive Overall Cognitive Status: No family/caregiver present to determine baseline cognitive functioning                  General Comments: Very distractible and with tangential speech      Exercises     Shoulder Instructions       General Comments      Pertinent Vitals/ Pain       Pain Assessment: Faces Faces Pain Scale: Hurts a little bit Pain Location: L knee (back of knee) Pain Descriptors / Indicators: Aching;Sore Pain Intervention(s): Monitored during session  Home Living  Prior Functioning/Environment              Frequency  Min 1X/week        Progress Toward Goals  OT Goals(current goals can now be found in the care plan section)     Acute Rehab OT Goals Patient Stated Goal: less knee pain OT Goal Formulation: With patient Time For Goal Achievement: 10/09/16 Potential to Achieve Goals: Good ADL Goals Pt Will Perform Tub/Shower Transfer: with supervision;ambulating;rolling walker;tub bench Additional ADL Goal #1: Pt will inpendently  verbalize three fall prevention strategies  Plan Discharge plan remains appropriate    Co-evaluation                 End of Session Equipment Utilized During Treatment: Gait belt;Rolling walker;Other (comment) (Knee immobilizer not in room; continues to demonstrate good strength and WB tolerance)  OT Visit Diagnosis: Unsteadiness on feet (R26.81);Pain Pain - Right/Left: Left Pain - part of body: Knee   Activity Tolerance Patient tolerated treatment well   Patient Left in chair;with call bell/phone within reach (With MD)   Nurse Communication Mobility status        Time: 7493-5521 OT Time Calculation (min): 34 min  Charges: OT General Charges $OT Visit: 1 Procedure OT Treatments $Self Care/Home Management : 23-37 mins  [place], OTR/L [phone]   [place] 09/26/2016, 10:13 AM

## 2016-09-26 NOTE — Progress Notes (Signed)
Physical Therapy Treatment Patient Details Name: Elijah Michaelis MRN: [id number] DOB: April 16, 1951 Today's Date: 09/26/2016    History of Present Illness Admitted for LTKA, WBAT, [place] with amb;  has a past medical history of Anemia; ANKLE PAIN, LEFT (04/01/2008); ANXIETY (04/17/2007); Arthritis; Chronic kidney disease; DEPRESSION (04/17/2007); DIZZINESS (08/01/2007); DYSPNEA (08/01/2007);  GLUCOSE INTOLERANCE (08/01/2007); Hypercalcemia due to sarcoidosis (2014);  HYPERSOMNIA (07/28/2008); HYPERTENSION (04/17/2007); Migraines;   PERIPHERAL EDEMA (04/21/2009); Sarcoidosis ([place]) (09/25/2007); SHOULDER PAIN, LEFT (04/01/2009);     PT Comments    Patient is making good progress with PT.  From a mobility standpoint anticipate patient will be ready for DC home when medically ready.      Follow Up Recommendations  Home health PT     Equipment Recommendations  Rolling walker with 5" wheels;3in1 (PT)    Recommendations for Other Services OT consult     Precautions / Restrictions Precautions Precautions: Knee Precaution Comments: Reviewed precautions Required Braces or Orthoses: Knee Immobilizer - Left Restrictions Weight Bearing Restrictions: Yes LLE Weight Bearing: Weight bearing as tolerated    Mobility  Bed Mobility Overal bed mobility: Independent       Supine to sit: Independent     General bed mobility comments: NA  Transfers Overall transfer level: Needs assistance Equipment used: Rolling walker (2 wheeled) Transfers: Sit to/from Stand Sit to Stand: Supervision         General transfer comment: X2 from recliner; cues for hand placement; second trial with one armrest to simulate chair at home  Ambulation/Gait Ambulation/Gait assistance: Min guard Ambulation Distance (Feet): 200 Feet Assistive device: Rolling walker (2 wheeled) Gait Pattern/deviations: Step-through pattern Gait velocity: fast but more steady today   General Gait Details: cues for safe proximity of RW and gait  speed   Stairs            Wheelchair Mobility    Modified Rankin (Stroke Patients Only)       Balance Overall balance assessment: Needs assistance Sitting-balance support: Feet supported Sitting balance-Leahy Scale: Good     Standing balance support: Bilateral upper extremity supported;During functional activity Standing balance-Leahy Scale: Fair Standing balance comment: Demonstrated good balance to gather materials around room and perform grooming at sink                    Cognition Arousal/Alertness: Awake/alert Behavior During Therapy: [facility] for tasks assessed/performed;Impulsive Overall Cognitive Status: Within Functional Limits for tasks assessed                 General Comments: Very distractible and with tangential speech (pt at baseline)    Exercises Total Joint Exercises Quad Sets: AROM;Left;10 reps Short Arc Quad: AROM;Left;10 reps Heel Slides: AROM;Left;10 reps Hip ABduction/ADduction: AROM;Left;10 reps Straight Leg Raises: AROM;Left;10 reps Goniometric ROM: 0-92    General Comments General comments (skin integrity, edema, etc.): Pt continues to present impulsivity; feel this is near baseline      Pertinent Vitals/Pain Pain Assessment: Faces Faces Pain Scale: Hurts a little bit Pain Location: L knee Pain Descriptors / Indicators: Aching;Sore;Grimacing Pain Intervention(s): Monitored during session;Premedicated before session;Repositioned    Home Living                      Prior Function            PT Goals (current goals can now be found in the care plan section) Acute Rehab PT Goals Patient Stated Goal: less knee pain Progress towards PT goals: Progressing toward goals  Frequency    7X/week      PT Plan Discharge plan needs to be updated    Co-evaluation             End of Session Equipment Utilized During Treatment: Gait belt Activity Tolerance: Patient tolerated treatment well Patient left:  in chair;with call bell/phone within reach Nurse Communication: Mobility status PT Visit Diagnosis: Unsteadiness on feet (R26.81);Pain Pain - Right/Left: Left Pain - part of body: Knee     Time: 0352-4818 PT Time Calculation (min) (ACUTE ONLY): 35 min  Charges:  $Gait Training: 8-22 mins $Therapeutic Exercise: 8-22 mins                    G Codes:       [place] April, PTA Pager: [phone]   09/26/2016, 1:26 PM

## 2016-09-26 NOTE — Progress Notes (Signed)
Subjective: 2 Days Post-Op Procedure(s) (LRB): TOTAL KNEE ARTHROPLASTY (Left) Patient reports pain as mild. Taking by mouth and voiding okay. Progressing with OT/PT.   Objective: Vital signs in last 24 hours: Temp:  [98.5 F (36.9 C)-98.7 F (37.1 C)] 98.5 F (36.9 C) (03/14 0602) Pulse Rate:  [75-82] 75 (03/14 0602) Resp:  [16] 16 (03/14 0602) BP: (130-139)/(62-87) 130/87 (03/14 0602) SpO2:  [96 %-99 %] 96 % (03/14 0602)  Intake/Output from previous day: 03/13 0701 - 03/14 0700 In: 3633.3 [P.O.:1560; I.V.:2073.3] Out: -  Intake/Output this shift: No intake/output data recorded.   Recent Labs  09/25/16 0441 09/26/16 0535  HGB 11.0* 10.1*    Recent Labs  09/25/16 0441 09/26/16 0535  WBC 9.3 16.6*  RBC 4.03 3.76*  HCT 34.2* 31.4*  PLT 198 210    Recent Labs  09/25/16 0441  NA 139  K 4.3  CL 106  CO2 26  BUN 26*  CREATININE 1.64*  GLUCOSE 200*  CALCIUM 9.6   No results for input(s): LABPT, INR in the last 72 hours. Left knee exam: Dressing clean and dry. Range of motion 0 to 90. Able to easily do a straight leg raise. Calf soft and nontender. Good ankle plantar and dorsiflexion. Distal pulses 2+. Sensation intact distally.  Assessment/Plan: 2 Days Post-Op Procedure(s) (LRB): TOTAL KNEE ARTHROPLASTY (Left) Plan: Patient did well with OT this morning. Getting ready to get up with physical therapy. We'll plan discharge today. Most likely can go home if passes physical therapy. She will need home health physical therapy and home health OT. Aspirin 325 mg twice daily 1 month for DVT prophylaxis. Follow-up with Dr. Berenice Primas in 10-14 days.  Iver Fehrenbach G 09/26/2016, 10:18 AM

## 2016-09-26 NOTE — Care Management Note (Signed)
Case Management Note  Patient Details  Name: Tara [name] MRN: [id number] Date of Birth: 10-20-1950  Subjective/Objective:     66 yr old female s/p left total knee arthroplasty.               Action/Plan: Case manager spoke with patient concerning discharge plan. Patient was preoperatively setup with [place], no changes. She states she has rolling walker and 3in1. CPM will be delivered to her home.    Expected Discharge Date:  09/26/16               Expected Discharge Plan:  [place]  In-House Referral:  NA  Discharge planning Services  CM Consult  Post Acute Care Choice:  Home Health, Durable Medical Equipment Choice offered to:  Patient  DME Arranged:  CPM (Has RW and 3in1) DME Agency:  [employer]  HH Arranged:  PT, OT HH Agency:  [place]  Status of Service:  Completed, signed off  If discussed at [place] of Stay Meetings, dates discussed:    Additional Comments:  [place], [place], RN 09/26/2016, 11:17 AM

## 2016-09-26 NOTE — Discharge Summary (Signed)
Patient ID: Tara [name] MRN: [id number] DOB/AGE: [birth year] 66 y.o.  Admit date: 09/24/2016 Discharge date: 09/26/2016  Admission Diagnoses:  Principal Problem:   Primary osteoarthritis of left knee   Discharge Diagnoses:  Same  Past Medical History:  Diagnosis Date  . Anemia   . ANKLE PAIN, LEFT 04/01/2008  . ANXIETY 04/17/2007  . Arthritis   . Chronic kidney disease   . COLONIC POLYPS, HX OF 08/01/2007  . CONTUSIONS, MULTIPLE 04/01/2009  . DEPRESSION 04/17/2007  . DIZZINESS 08/01/2007  . DYSPNEA 08/01/2007   with exertion  . Enlargement of lymph nodes 08/13/2007  . GLUCOSE INTOLERANCE 08/01/2007  . 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 HYPERLIPIDEMIA 08/01/2007  . HYPERSOMNIA 07/28/2008  . HYPERTENSION 04/17/2007  . Impaired glucose tolerance 03/23/2011  . JOINT EFFUSION, LEFT KNEE 06/02/2010  . Loose body in knee 04/01/2009  . Migraines    "stopped 3-4 yr ago" (07/23/2012)  . Morbid obesity (HCC) 04/20/2007  . OTHER DISEASES OF LUNG NOT ELSEWHERE CLASSIFIED 08/01/2007  . Pain in joint, lower leg 04/01/2009  . PERIPHERAL EDEMA 04/21/2009  . Pre-diabetes   . Sarcoidosis ([facility]) 09/25/2007  . SHOULDER PAIN, LEFT 04/01/2009  . Sleep apnea    does not use Cpap    Surgeries: Procedure(s):LEFT TOTAL KNEE ARTHROPLASTY on 09/24/2016 Discharged Condition: Improved  Hospital Course: Tara [name] is an 66 y.o. female who was admitted 09/24/2016 for operative treatment ofPrimary osteoarthritis of left knee. Patient has severe unremitting pain that affects sleep, daily activities, and work/hobbies. After pre-op clearance the patient was taken to the operating room on 09/24/2016 and underwent  Procedure(s):LEFT TOTAL KNEE ARTHROPLASTY.    Patient was given perioperative antibiotics: Anti-infectives    Start     Dose/Rate Route Frequency Ordered Stop   09/24/16 2000  ceFAZolin (ANCEF) IVPB 2g/100 mL premix     2 g 200 mL/hr over 30 Minutes Intravenous Every 6 hours 09/24/16 1829 09/25/16 0230    09/24/16 1141  ceFAZolin (ANCEF) 2-4 GM/100ML-% IVPB    Comments:  Shireen Quan   : cabinet override      09/24/16 1141 09/24/16 1400   09/24/16 1137  ceFAZolin (ANCEF) IVPB 2g/100 mL premix     2 g 200 mL/hr over 30 Minutes Intravenous On call to O.R. 09/24/16 1137 09/24/16 1410       Patient was given sequential compression devices, early ambulation, and chemoprophylaxis to prevent DVT.She made good progress with occupational therapy and physical therapy.  Patient benefited maximally from hospital stay and there were no complications.    Recent vital signs: Patient Vitals for the past 24 hrs:  BP Temp Temp src Pulse Resp SpO2  09/26/16 0602 130/87 98.5 F (36.9 C) Oral 75 16 96 %  09/25/16 2136 139/62 98.7 F (37.1 C) Oral 82 16 99 %     Recent laboratory studies:  Recent Labs  09/25/16 0441 09/26/16 0535  WBC 9.3 16.6*  HGB 11.0* 10.1*  HCT 34.2* 31.4*  PLT 198 210  NA 139  --   K 4.3  --   CL 106  --   CO2 26  --   BUN 26*  --   CREATININE 1.64*  --   GLUCOSE 200*  --   CALCIUM 9.6  --      Discharge Medications:   Allergies as of 09/26/2016      Reactions   Fluoxetine Hcl Other (See Comments)   (PROZAC) Suicidal thoughts   Chocolate Other (See Comments)   SOMETIMES CAUSES  SEVERE HEADACHES   Floxin [ocuflox] Anxiety   shaky   Other Nausea And Vomiting   INSTANT ICED TEA PACKETS   Sulfonamide Derivatives Rash      Medication List    STOP taking these medications   acetaminophen 500 MG tablet Commonly known as:  TYLENOL   glucosamine-chondroitin 500-400 MG tablet     TAKE these medications   albuterol 108 (90 Base) MCG/ACT inhaler Commonly known as:  PROVENTIL HFA;VENTOLIN HFA Inhale 2 puffs into the lungs every 6 (six) hours as needed for wheezing or shortness of breath.   amphetamine-dextroamphetamine 30 MG 24 hr capsule Commonly known as:  ADDERALL XR Take 1 capsule (30 mg total) by mouth daily.   amphetamine-dextroamphetamine 30 MG  tablet Commonly known as:  ADDERALL Take 30 mg by mouth 2 (two) times daily.   antiseptic oral rinse Liqd 15 mLs by Mouth Rinse route 2 (two) times daily as needed for dry mouth.   aspirin EC 325 MG tablet Take 1 tablet (325 mg total) by mouth 2 (two) times daily after a meal. Take x 1 month post op to decrease risk of blood clots. What changed:  medication strength  how much to take  when to take this  additional instructions   atenolol 50 MG tablet Commonly known as:  TENORMIN TAKE 1 TABLET(50 MG) BY MOUTH DAILY What changed:  how much to take  how to take this  when to take this  additional instructions   calcitonin (salmon) 200 UNIT/ACT nasal spray Commonly known as:  MIACALCIN/FORTICAL Place 1 spray into alternate nostrils daily.   cloNIDine 0.1 MG tablet Commonly known as:  CATAPRES Take 0.1 mg by mouth 2 (two) times daily.   clorazepate 7.5 MG tablet Commonly known as:  TRANXENE Take 7.5 mg by mouth 3 (three) times daily.   desvenlafaxine 100 MG 24 hr tablet Commonly known as:  PRISTIQ Take 100 mg by mouth at bedtime.   diltiazem 240 MG 24 hr capsule Commonly known as:  DILT-XR Take 1 capsule (240 mg total) by mouth daily. What changed:  when to take this   docusate sodium 100 MG capsule Commonly known as:  COLACE Take 1 capsule (100 mg total) by mouth 2 (two) times daily.   ferrous sulfate 325 (65 FE) MG tablet Take 325 mg by mouth daily with breakfast.   furosemide 40 MG tablet Commonly known as:  LASIX Take 1 tablet (40 mg total) by mouth 2 (two) times daily. What changed:  when to take this   GENTEAL OP Place 1-2 drops into both eyes 3 (three) times daily as needed (for dry eyes/irritated eyes).   hydrALAZINE 100 MG tablet Commonly known as:  APRESOLINE Take 1 tablet (100 mg total) by mouth every 8 (eight) hours. What changed:  when to take this   latanoprost 0.005 % ophthalmic solution Commonly known as:  XALATAN Place 1 drop into  both eyes at bedtime.   Melatonin 5 MG Tabs Take 10 mg by mouth at bedtime.   methocarbamol 750 MG tablet Commonly known as:  ROBAXIN-750 Take 1 tablet (750 mg total) by mouth every 8 (eight) hours as needed for muscle spasms.   multivitamin with minerals tablet Take 1 tablet by mouth daily.   Omega 3 1200 MG Caps Take 2,400 mg by mouth at bedtime.   oxyCODONE-acetaminophen 5-325 MG tablet Commonly known as:  PERCOCET/ROXICET Take 1-2 tablets by mouth every 4 (four) hours as needed for severe pain.   timolol 0.5 %  ophthalmic solution Commonly known as:  TIMOPTIC Place 1 drop into both eyes 2 (two) times daily.   traMADol 50 MG tablet Commonly known as:  ULTRAM Take 1 tablet (50 mg total) by mouth every 12 (twelve) hours as needed. What changed:  reasons to take this   zaleplon 10 MG capsule Commonly known as:  SONATA Take 10 mg by mouth at bedtime as needed for sleep.       Diagnostic Studies: No results found.  Disposition: 03-Skilled Nursing Facility  Discharge Instructions    CPM    Complete by:  As directed    Continuous passive motion machine (CPM):      Use the CPM from 0 to 90 for 8 hours per day.      You may increase by 5-10 per day.  You may break it up into 2 or 3 sessions per day.      Use CPM for 1-2 weeks or until you are told to stop.   Call MD / Call 911    Complete by:  As directed    If you experience chest pain or shortness of breath, CALL 911 and be transported to the hospital emergency room.  If you develope a fever above 101 F, pus (white drainage) or increased drainage or redness at the wound, or calf pain, call your surgeon's office.   Constipation Prevention    Complete by:  As directed    Drink plenty of fluids.  Prune juice may be helpful.  You may use a stool softener, such as Colace (over the counter) 100 mg twice a day.  Use MiraLax (over the counter) for constipation as needed.   Diet general    Complete by:  As directed    Do  not put a pillow under the knee. Place it under the heel.    Complete by:  As directed    Face-to-face encounter (required for Medicare/Medicaid patients)    Complete by:  As directed    I Hartman Minahan G certify that this patient is under my care and that I, or a nurse practitioner or physician's assistant working with me, had a face-to-face encounter that meets the physician face-to-face encounter requirements with this patient on 09/26/2016. The encounter with the patient was in whole, or in part for the following medical condition(s) which is the primary reason for home health care (List medical condition): Status post left total knee arthroplasty   The encounter with the patient was in whole, or in part, for the following medical condition, which is the primary reason for home health care:  s/p left total knee arthroplasty   I certify that, based on my findings, the following services are medically necessary home health services:  Physical therapy   Reason for Medically Necessary Home Health Services:   Therapy- Investment banker, operational, Patent examiner Therapy- Instruction on Safe use of Assistive Devices for ADLs Therapy- Therapeutic Exercises to Increase Strength and Endurance Therapy- Home Adaptation to Facilitate Safety     My clinical findings support the need for the above services:   Pain interferes with ambulation/mobility Unable to leave home safely without assistance and/or assistive device     Further, I certify that my clinical findings support that this patient is homebound due to:   Ambulates short distances less than 300 feet Pain interferes with ambulation/mobility     Home Health    Complete by:  As directed    To provide the following care/treatments:  PT OT     Increase activity slowly as tolerated    Complete by:  As directed    Weight bearing as tolerated    Complete by:  As directed    Laterality:  left   Extremity:  Lower      Follow-up  Information    Levetta Bognar G, PA-C. Schedule an appointment as soon as possible for a visit on 10/04/2016.   Specialty:  Orthopedic Surgery Contact information: [facility] [address] [place] [address] [phone]            Signed: Matthew Folks 09/26/2016, 10:32 AM

## 2016-09-27 DIAGNOSIS — Z96652 Presence of left artificial knee joint: Secondary | ICD-10-CM | POA: Diagnosis not present

## 2016-09-27 DIAGNOSIS — Z471 Aftercare following joint replacement surgery: Secondary | ICD-10-CM | POA: Diagnosis not present

## 2016-09-27 LAB — TYPE AND SCREEN
ABO/RH(D): O NEG
Antibody Screen: NEGATIVE
UNIT DIVISION: 0
Unit division: 0

## 2016-09-27 LAB — BPAM RBC
BLOOD PRODUCT EXPIRATION DATE: 201803282359
BLOOD PRODUCT EXPIRATION DATE: 201803292359
ISSUE DATE / TIME: 201803051506
UNIT TYPE AND RH: 9500
Unit Type and Rh: 9500

## 2016-10-04 DIAGNOSIS — M1712 Unilateral primary osteoarthritis, left knee: Secondary | ICD-10-CM | POA: Diagnosis not present

## 2016-10-10 ENCOUNTER — Encounter: Payer: Self-pay | Admitting: Physical Therapy

## 2016-10-10 ENCOUNTER — Ambulatory Visit: Payer: BLUE CROSS/BLUE SHIELD | Attending: Orthopedic Surgery | Admitting: Physical Therapy

## 2016-10-10 DIAGNOSIS — R262 Difficulty in walking, not elsewhere classified: Secondary | ICD-10-CM

## 2016-10-10 DIAGNOSIS — M25662 Stiffness of left knee, not elsewhere classified: Secondary | ICD-10-CM | POA: Insufficient documentation

## 2016-10-10 DIAGNOSIS — M25562 Pain in left knee: Secondary | ICD-10-CM | POA: Diagnosis not present

## 2016-10-10 DIAGNOSIS — R2242 Localized swelling, mass and lump, left lower limb: Secondary | ICD-10-CM | POA: Diagnosis not present
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
## 2016-10-16 ENCOUNTER — Ambulatory Visit: Payer: BLUE CROSS/BLUE SHIELD | Attending: Orthopedic Surgery | Admitting: Physical Therapy

## 2016-10-16 ENCOUNTER — Encounter: Payer: Self-pay | Admitting: Physical Therapy

## 2016-10-16 DIAGNOSIS — M25662 Stiffness of left knee, not elsewhere classified: Secondary | ICD-10-CM | POA: Diagnosis not present

## 2016-10-16 DIAGNOSIS — R2242 Localized swelling, mass and lump, left lower limb: Secondary | ICD-10-CM | POA: Insufficient documentation

## 2016-10-16 DIAGNOSIS — M25562 Pain in left knee: Secondary | ICD-10-CM | POA: Insufficient documentation

## 2016-10-16 DIAGNOSIS — R262 Difficulty in walking, not elsewhere classified: Secondary | ICD-10-CM

## 2016-10-16 NOTE — Therapy (Signed)
04/21/2009  . Pain in joint, lower leg 04/01/2009  . Hypersomnia 07/28/2008  . Sarcoidosis ([place]) 09/25/2007  . Hyperlipidemia 08/01/2007  . DIZZINESS 08/01/2007  . COLONIC POLYPS, HX OF 08/01/2007  . Morbid obesity ([place]) 04/20/2007  . Anxiety state 04/17/2007  . Depression 04/17/2007  . Essential hypertension 04/17/2007  . Migraine 04/17/2007    Yzabella Crunk,ANGIE PTA 10/16/2016, 11:44 AM  [address] [place], [place], [address] Phone: [phone]   Fax:  [phone]  Name: Tara [name] MRN: [id number] Date of Birth: [birth year]  04/21/2009  . Pain in joint, lower leg 04/01/2009  . Hypersomnia 07/28/2008  . Sarcoidosis ([place]) 09/25/2007  . Hyperlipidemia 08/01/2007  . DIZZINESS 08/01/2007  . COLONIC POLYPS, HX OF 08/01/2007  . Morbid obesity ([place]) 04/20/2007  . Anxiety state 04/17/2007  . Depression 04/17/2007  . Essential hypertension 04/17/2007  . Migraine 04/17/2007    Yzabella Crunk,ANGIE PTA 10/16/2016, 11:44 AM  [address] [place], [place], [address] Phone: [phone]   Fax:  [phone]  Name: Tara [name] MRN: [id number] Date of Birth: [birth year]  [place], [place], [address] Phone: [phone]   Fax:  [phone]  Physical Therapy Treatment  Patient Details  Name: Tara [name] MRN: [id number] Date of Birth: Nov 25, 1950 Referring Provider: Dorna Leitz  Encounter Date: 10/16/2016      PT End of Session - 10/16/16 1141    Visit Number 2   Date for PT Re-Evaluation 12/10/16   PT Start Time 1112   PT Stop Time 1200   PT Time Calculation (min) 48 min      Past Medical History:  Diagnosis Date  . Anemia   . ANKLE PAIN, LEFT 04/01/2008  . ANXIETY 04/17/2007  . Arthritis   . Chronic kidney disease   . COLONIC POLYPS, HX OF 08/01/2007  . CONTUSIONS, MULTIPLE 04/01/2009  . DEPRESSION 04/17/2007  . DIZZINESS 08/01/2007  . DYSPNEA 08/01/2007   with exertion  . Enlargement of lymph nodes 08/13/2007  . GLUCOSE INTOLERANCE 08/01/2007  . 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 HYPERLIPIDEMIA 08/01/2007  . HYPERSOMNIA 07/28/2008  . HYPERTENSION 04/17/2007  . Impaired glucose tolerance 03/23/2011  . JOINT EFFUSION, LEFT KNEE 06/02/2010  . Loose body in knee 04/01/2009  . Migraines    "stopped 3-4 yr ago" (07/23/2012)  . Morbid obesity ([place]) 04/20/2007  . OTHER DISEASES OF LUNG NOT ELSEWHERE CLASSIFIED 08/01/2007  . Pain in joint, lower leg 04/01/2009  . PERIPHERAL EDEMA 04/21/2009  . Pre-diabetes   . Sarcoidosis ([place]) 09/25/2007  . SHOULDER PAIN, LEFT 04/01/2009  . Sleep apnea    does not use Cpap    Past Surgical History:  Procedure Laterality Date  . BREAST SURGERY     Biopsy benign  . COMBINED MEDIASTINOSCOPY AND BRONCHOSCOPY  08/2007  . COMBINED MEDIASTINOSCOPY AND BRONCHOSCOPY  2009  . FRACTURE SURGERY  ?02/1997   "left upper arm; put rod in" (07/23/2012)  . GUM SURGERY  2000-?2009   "several ORs; soft tissue graft; took material from roof of mouth" (07/23/2012  . KNEE ARTHROSCOPY  03/2004; 06/2009   "right; left" Dr. Theda Sers  . LYMPH NODE BIOPSY   ~ 2009   "for sarcoidosis; don't know exactly which nodes" (07/23/2102)  . MYOMECTOMY  1994   Open  . REFRACTIVE SURGERY  08/1998   "both eyes" (07/23/2012)  . TOTAL KNEE ARTHROPLASTY Left 09/24/2016   Procedure: TOTAL KNEE ARTHROPLASTY;  Surgeon: Dorna Leitz, MD;  Location: [facility];  Service: Orthopedics;  Laterality: Left;    There were no vitals filed for this visit.      Subjective Assessment - 10/16/16 1116    Subjective pt amb in without AD, sleep most of yesterday so didnt do much.   Currently in Pain? Yes   Pain Score 3    Pain Location Knee   Pain Orientation Left            [facility] PT Assessment - 10/16/16 0001      AROM   AROM Assessment Site Knee   Right/Left Knee Left   Left Knee Extension 3   Left Knee Flexion 110                     [facility] Adult PT Treatment/Exercise - 10/16/16 0001      Ambulation/Gait   Gait Comments amb without AD cuing to decrease deficiets     Knee/Hip Exercises: Aerobic   Stationary Bike 6 min L2      Knee/Hip Exercises: Standing   Other Standing

## 2016-10-18 ENCOUNTER — Encounter: Payer: Self-pay | Admitting: Physical Therapy

## 2016-10-18 ENCOUNTER — Ambulatory Visit: Payer: BLUE CROSS/BLUE SHIELD | Admitting: Physical Therapy

## 2016-10-18 DIAGNOSIS — M25662 Stiffness of left knee, not elsewhere classified: Secondary | ICD-10-CM | POA: Diagnosis not present

## 2016-10-18 DIAGNOSIS — R2242 Localized swelling, mass and lump, left lower limb: Secondary | ICD-10-CM | POA: Diagnosis not present

## 2016-10-18 DIAGNOSIS — M25562 Pain in left knee: Secondary | ICD-10-CM | POA: Diagnosis not present

## 2016-10-18 DIAGNOSIS — R262 Difficulty in walking, not elsewhere classified: Secondary | ICD-10-CM | POA: Diagnosis not present
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
## 2016-10-22 DIAGNOSIS — M25562 Pain in left knee: Secondary | ICD-10-CM | POA: Diagnosis not present

## 2016-10-22 DIAGNOSIS — Z96652 Presence of left artificial knee joint: Secondary | ICD-10-CM | POA: Diagnosis not present

## 2016-10-24 ENCOUNTER — Ambulatory Visit: Payer: BLUE CROSS/BLUE SHIELD | Admitting: Physical Therapy

## 2016-10-24 ENCOUNTER — Encounter: Payer: Self-pay | Admitting: Physical Therapy

## 2016-10-24 DIAGNOSIS — M25562 Pain in left knee: Secondary | ICD-10-CM

## 2016-10-24 DIAGNOSIS — M25662 Stiffness of left knee, not elsewhere classified: Secondary | ICD-10-CM | POA: Diagnosis not present

## 2016-10-24 DIAGNOSIS — R2242 Localized swelling, mass and lump, left lower limb: Secondary | ICD-10-CM | POA: Diagnosis not present

## 2016-10-24 DIAGNOSIS — R262 Difficulty in walking, not elsewhere classified: Secondary | ICD-10-CM
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
## 2016-10-26 ENCOUNTER — Encounter: Payer: Self-pay | Admitting: Physical Therapy

## 2016-10-26 ENCOUNTER — Ambulatory Visit: Payer: BLUE CROSS/BLUE SHIELD | Admitting: Physical Therapy

## 2016-10-26 DIAGNOSIS — R2242 Localized swelling, mass and lump, left lower limb: Secondary | ICD-10-CM

## 2016-10-26 DIAGNOSIS — M25662 Stiffness of left knee, not elsewhere classified: Secondary | ICD-10-CM | POA: Diagnosis not present

## 2016-10-26 DIAGNOSIS — M25562 Pain in left knee: Secondary | ICD-10-CM | POA: Diagnosis not present

## 2016-10-26 DIAGNOSIS — R262 Difficulty in walking, not elsewhere classified: Secondary | ICD-10-CM | POA: Diagnosis not present

## 2016-10-26 NOTE — Therapy (Signed)
[place], [place], [address] Phone: [phone]   Fax:  [phone]  Physical Therapy Treatment  Patient Details  Name: Tara [name] MRN: [id number] Date of Birth: 01-01-1951 Referring Provider: Dorna Leitz  Encounter Date: 10/26/2016      PT End of Session - 10/26/16 1009    Visit Number 5   Date for PT Re-Evaluation 12/10/16   PT Start Time 0918   PT Stop Time 1020   PT Time Calculation (min) 62 min   Activity Tolerance Patient tolerated treatment well   Behavior During Therapy [facility] for tasks assessed/performed;Impulsive      Past Medical History:  Diagnosis Date  . Anemia   . ANKLE PAIN, LEFT 04/01/2008  . ANXIETY 04/17/2007  . Arthritis   . Chronic kidney disease   . COLONIC POLYPS, HX OF 08/01/2007  . CONTUSIONS, MULTIPLE 04/01/2009  . DEPRESSION 04/17/2007  . DIZZINESS 08/01/2007  . DYSPNEA 08/01/2007   with exertion  . Enlargement of lymph nodes 08/13/2007  . GLUCOSE INTOLERANCE 08/01/2007  . 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 HYPERLIPIDEMIA 08/01/2007  . HYPERSOMNIA 07/28/2008  . HYPERTENSION 04/17/2007  . Impaired glucose tolerance 03/23/2011  . JOINT EFFUSION, LEFT KNEE 06/02/2010  . Loose body in knee 04/01/2009  . Migraines    "stopped 3-4 yr ago" (07/23/2012)  . Morbid obesity ([place]) 04/20/2007  . OTHER DISEASES OF LUNG NOT ELSEWHERE CLASSIFIED 08/01/2007  . Pain in joint, lower leg 04/01/2009  . PERIPHERAL EDEMA 04/21/2009  . Pre-diabetes   . Sarcoidosis ([place]) 09/25/2007  . SHOULDER PAIN, LEFT 04/01/2009  . Sleep apnea    does not use Cpap    Past Surgical History:  Procedure Laterality Date  . BREAST SURGERY     Biopsy benign  . COMBINED MEDIASTINOSCOPY AND BRONCHOSCOPY  08/2007  . COMBINED MEDIASTINOSCOPY AND BRONCHOSCOPY  2009  . FRACTURE SURGERY  ?02/1997   "left upper arm; put rod in" (07/23/2012)  . GUM SURGERY  2000-?2009   "several ORs; soft tissue graft; took  material from roof of mouth" (07/23/2012  . KNEE ARTHROSCOPY  03/2004; 06/2009   "right; left" Dr. Theda Sers  . LYMPH NODE BIOPSY  ~ 2009   "for sarcoidosis; don't know exactly which nodes" (07/23/2102)  . MYOMECTOMY  1994   Open  . REFRACTIVE SURGERY  08/1998   "both eyes" (07/23/2012)  . TOTAL KNEE ARTHROPLASTY Left 09/24/2016   Procedure: TOTAL KNEE ARTHROPLASTY;  Surgeon: Dorna Leitz, MD;  Location: [place];  Service: Orthopedics;  Laterality: Left;    There were no vitals filed for this visit.      Subjective Assessment - 10/26/16 0938    Subjective walked a couple blocked yesterday so sore   Currently in Pain? Yes   Pain Score 3    Pain Location Knee   Pain Orientation Left   Pain Descriptors / Indicators Aching                         [facility] Adult PT Treatment/Exercise - 10/26/16 0001      Knee/Hip Exercises: Aerobic   Stationary Bike 6 min L2    Elliptical     Nustep Level 6 3 min, Leve 5 3 min  LE only     Knee/Hip Exercises: Machines for Strengthening   Total Gym Leg Press LLE 3 x10 20#      Knee/Hip Exercises: Standing   Other Standing Knee  of right lower extremity 11/17/2014  . Pronation of feet 11/17/2014  . Right ankle pain 11/02/2014  . Localized osteoarthrosis, lower leg 04/13/2014  . Left ear hearing loss 03/25/2014  . CKD (chronic kidney disease) stage 4, GFR 15-29 ml/min (HCC) 06/08/2013  . Microcytic anemia 06/06/2013  . Hyponatremia 06/03/2013  . Acute on chronic kidney failure ([place]) 06/03/2013  . UTI (urinary tract infection) 06/03/2013  . Leucocytosis 06/03/2013  . Asymptomatic proteinuria 08/27/2012  . Hypercalcemia 07/23/2012  . Renal insufficiency 07/23/2012  . Fibroid   . Oligomenorrhea   . Steroid-induced diabetes ([place]) 03/23/2011  . Preventative health care 03/23/2011  . JOINT EFFUSION, LEFT KNEE 06/02/2010  . PERIPHERAL EDEMA 04/21/2009  . Pain in joint, lower leg 04/01/2009  . Hypersomnia 07/28/2008  . Sarcoidosis ([place]) 09/25/2007  . Hyperlipidemia 08/01/2007  . DIZZINESS 08/01/2007  . COLONIC POLYPS, HX OF 08/01/2007  . Morbid obesity ([place]) 04/20/2007  . Anxiety state 04/17/2007  . Depression 04/17/2007  . Essential hypertension 04/17/2007  . Migraine 04/17/2007   Alan Mulder SPTA Vannie Hilgert,ANGIE PTA  10/26/2016, 10:13 AM  [place], [place], [address] Phone: [phone]   Fax:  [phone]  Name: Tara [name] MRN: [id number] Date of Birth: Feb 12, 1951  of right lower extremity 11/17/2014  . Pronation of feet 11/17/2014  . Right ankle pain 11/02/2014  . Localized osteoarthrosis, lower leg 04/13/2014  . Left ear hearing loss 03/25/2014  . CKD (chronic kidney disease) stage 4, GFR 15-29 ml/min (HCC) 06/08/2013  . Microcytic anemia 06/06/2013  . Hyponatremia 06/03/2013  . Acute on chronic kidney failure ([place]) 06/03/2013  . UTI (urinary tract infection) 06/03/2013  . Leucocytosis 06/03/2013  . Asymptomatic proteinuria 08/27/2012  . Hypercalcemia 07/23/2012  . Renal insufficiency 07/23/2012  . Fibroid   . Oligomenorrhea   . Steroid-induced diabetes ([place]) 03/23/2011  . Preventative health care 03/23/2011  . JOINT EFFUSION, LEFT KNEE 06/02/2010  . PERIPHERAL EDEMA 04/21/2009  . Pain in joint, lower leg 04/01/2009  . Hypersomnia 07/28/2008  . Sarcoidosis ([place]) 09/25/2007  . Hyperlipidemia 08/01/2007  . DIZZINESS 08/01/2007  . COLONIC POLYPS, HX OF 08/01/2007  . Morbid obesity ([place]) 04/20/2007  . Anxiety state 04/17/2007  . Depression 04/17/2007  . Essential hypertension 04/17/2007  . Migraine 04/17/2007   Alan Mulder SPTA Vannie Hilgert,ANGIE PTA  10/26/2016, 10:13 AM  [place], [place], [address] Phone: [phone]   Fax:  [phone]  Name: Tara [name] MRN: [id number] Date of Birth: Feb 12, 1951

## 2016-11-02 ENCOUNTER — Ambulatory Visit: Payer: BLUE CROSS/BLUE SHIELD | Admitting: Physical Therapy

## 2016-11-02 ENCOUNTER — Encounter: Payer: Self-pay | Admitting: Physical Therapy

## 2016-11-02 DIAGNOSIS — M25662 Stiffness of left knee, not elsewhere classified: Secondary | ICD-10-CM

## 2016-11-02 DIAGNOSIS — M25562 Pain in left knee: Secondary | ICD-10-CM

## 2016-11-02 DIAGNOSIS — R262 Difficulty in walking, not elsewhere classified: Secondary | ICD-10-CM

## 2016-11-02 DIAGNOSIS — R2242 Localized swelling, mass and lump, left lower limb: Secondary | ICD-10-CM | POA: Diagnosis not present
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
## 2016-11-05 ENCOUNTER — Ambulatory Visit: Payer: BLUE CROSS/BLUE SHIELD | Admitting: Physical Therapy

## 2016-11-05 ENCOUNTER — Encounter: Payer: Self-pay | Admitting: Physical Therapy

## 2016-11-05 DIAGNOSIS — R2242 Localized swelling, mass and lump, left lower limb: Secondary | ICD-10-CM

## 2016-11-05 DIAGNOSIS — R262 Difficulty in walking, not elsewhere classified: Secondary | ICD-10-CM | POA: Diagnosis not present

## 2016-11-05 DIAGNOSIS — M25662 Stiffness of left knee, not elsewhere classified: Secondary | ICD-10-CM

## 2016-11-05 DIAGNOSIS — M25562 Pain in left knee: Secondary | ICD-10-CM
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
## 2016-11-07 ENCOUNTER — Ambulatory Visit: Payer: BLUE CROSS/BLUE SHIELD | Admitting: Physical Therapy

## 2016-11-07 ENCOUNTER — Encounter: Payer: Self-pay | Admitting: Physical Therapy

## 2016-11-07 DIAGNOSIS — R262 Difficulty in walking, not elsewhere classified: Secondary | ICD-10-CM | POA: Diagnosis not present

## 2016-11-07 DIAGNOSIS — R2242 Localized swelling, mass and lump, left lower limb: Secondary | ICD-10-CM

## 2016-11-07 DIAGNOSIS — M25662 Stiffness of left knee, not elsewhere classified: Secondary | ICD-10-CM

## 2016-11-07 DIAGNOSIS — M25562 Pain in left knee: Secondary | ICD-10-CM
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
## 2016-11-13 ENCOUNTER — Encounter: Payer: Self-pay | Admitting: Physical Therapy

## 2016-11-13 ENCOUNTER — Ambulatory Visit: Payer: BLUE CROSS/BLUE SHIELD | Attending: Orthopedic Surgery | Admitting: Physical Therapy

## 2016-11-13 DIAGNOSIS — M25562 Pain in left knee: Secondary | ICD-10-CM | POA: Insufficient documentation

## 2016-11-13 DIAGNOSIS — M25662 Stiffness of left knee, not elsewhere classified: Secondary | ICD-10-CM | POA: Insufficient documentation

## 2016-11-13 DIAGNOSIS — R2242 Localized swelling, mass and lump, left lower limb: Secondary | ICD-10-CM | POA: Insufficient documentation

## 2016-11-13 DIAGNOSIS — R262 Difficulty in walking, not elsewhere classified: Secondary | ICD-10-CM | POA: Diagnosis not present

## 2016-11-13 NOTE — Therapy (Signed)
[place], [place], [address] Phone: [phone]   Fax:  [phone]  Physical Therapy Treatment  Patient Details  Name: Tara [name] MRN: [id number] Date of Birth: [birth year] Referring Provider: Dorna Leitz  Encounter Date: 11/13/2016      PT End of Session - 11/13/16 1012    Visit Number 9   Date for PT Re-Evaluation 12/10/16   PT Start Time 0930   PT Stop Time 1023   PT Time Calculation (min) 53 min   Activity Tolerance Patient tolerated treatment well;Patient limited by fatigue   Behavior During Therapy [facility] for tasks assessed/performed;Impulsive      Past Medical History:  Diagnosis Date  . Anemia   . ANKLE PAIN, LEFT 04/01/2008  . ANXIETY 04/17/2007  . Arthritis   . Chronic kidney disease   . COLONIC POLYPS, HX OF 08/01/2007  . CONTUSIONS, MULTIPLE 04/01/2009  . DEPRESSION 04/17/2007  . DIZZINESS 08/01/2007  . DYSPNEA 08/01/2007   with exertion  . Enlargement of lymph nodes 08/13/2007  . GLUCOSE INTOLERANCE 08/01/2007  . 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 HYPERLIPIDEMIA 08/01/2007  . HYPERSOMNIA 07/28/2008  . HYPERTENSION 04/17/2007  . Impaired glucose tolerance 03/23/2011  . JOINT EFFUSION, LEFT KNEE 06/02/2010  . Loose body in knee 04/01/2009  . Migraines    "stopped 3-4 yr ago" (07/23/2012)  . Morbid obesity ([place]) 04/20/2007  . OTHER DISEASES OF LUNG NOT ELSEWHERE CLASSIFIED 08/01/2007  . Pain in joint, lower leg 04/01/2009  . PERIPHERAL EDEMA 04/21/2009  . Pre-diabetes   . Sarcoidosis 09/25/2007  . SHOULDER PAIN, LEFT 04/01/2009  . Sleep apnea    does not use Cpap    Past Surgical History:  Procedure Laterality Date  . BREAST SURGERY     Biopsy benign  . COMBINED MEDIASTINOSCOPY AND BRONCHOSCOPY  08/2007  . COMBINED MEDIASTINOSCOPY AND BRONCHOSCOPY  2009  . FRACTURE SURGERY  ?02/1997   "left upper arm; put rod in" (07/23/2012)  . GUM SURGERY  2000-?2009   "several ORs; soft  tissue graft; took material from roof of mouth" (07/23/2012  . KNEE ARTHROSCOPY  03/2004; 06/2009   "right; left" Dr. Theda Sers  . LYMPH NODE BIOPSY  ~ 2009   "for sarcoidosis; don't know exactly which nodes" (07/23/2102)  . MYOMECTOMY  1994   Open  . REFRACTIVE SURGERY  08/1998   "both eyes" (07/23/2012)  . TOTAL KNEE ARTHROPLASTY Left 09/24/2016   Procedure: TOTAL KNEE ARTHROPLASTY;  Surgeon: Dorna Leitz, MD;  Location: [facility];  Service: Orthopedics;  Laterality: Left;    There were no vitals filed for this visit.      Subjective Assessment - 11/13/16 0938    Subjective Pt reports no pain today just stiffness in her knee. Reported that she had no issues after last visit.    Currently in Pain? No/denies   Pain Score 0-No pain                         [facility] Adult PT Treatment/Exercise - 11/13/16 0001      Knee/Hip Exercises: Aerobic   Recumbent Bike Lvl 3 2 min/LvL2 4 min     Knee/Hip Exercises: Machines for Strengthening   Cybex Knee Extension 5lbs 2x10   Cybex Knee Flexion 25# 2x15   Cybex Leg Press 40lb 2x10   Total Gym Leg Press LLE 2 x 10 30#     Knee/Hip Exercises: Standing   Other  [place] [address] [place], [place], [address] Phone: [phone]   Fax:  [phone]  Name: Tara [name] MRN: [id number] Date of Birth: Nov 01, 1950  [place], [place], [address] Phone: [phone]   Fax:  [phone]  Physical Therapy Treatment  Patient Details  Name: Tara [name] MRN: [id number] Date of Birth: [birth year] Referring Provider: Dorna Leitz  Encounter Date: 11/13/2016      PT End of Session - 11/13/16 1012    Visit Number 9   Date for PT Re-Evaluation 12/10/16   PT Start Time 0930   PT Stop Time 1023   PT Time Calculation (min) 53 min   Activity Tolerance Patient tolerated treatment well;Patient limited by fatigue   Behavior During Therapy [facility] for tasks assessed/performed;Impulsive      Past Medical History:  Diagnosis Date  . Anemia   . ANKLE PAIN, LEFT 04/01/2008  . ANXIETY 04/17/2007  . Arthritis   . Chronic kidney disease   . COLONIC POLYPS, HX OF 08/01/2007  . CONTUSIONS, MULTIPLE 04/01/2009  . DEPRESSION 04/17/2007  . DIZZINESS 08/01/2007  . DYSPNEA 08/01/2007   with exertion  . Enlargement of lymph nodes 08/13/2007  . GLUCOSE INTOLERANCE 08/01/2007  . 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 HYPERLIPIDEMIA 08/01/2007  . HYPERSOMNIA 07/28/2008  . HYPERTENSION 04/17/2007  . Impaired glucose tolerance 03/23/2011  . JOINT EFFUSION, LEFT KNEE 06/02/2010  . Loose body in knee 04/01/2009  . Migraines    "stopped 3-4 yr ago" (07/23/2012)  . Morbid obesity ([place]) 04/20/2007  . OTHER DISEASES OF LUNG NOT ELSEWHERE CLASSIFIED 08/01/2007  . Pain in joint, lower leg 04/01/2009  . PERIPHERAL EDEMA 04/21/2009  . Pre-diabetes   . Sarcoidosis 09/25/2007  . SHOULDER PAIN, LEFT 04/01/2009  . Sleep apnea    does not use Cpap    Past Surgical History:  Procedure Laterality Date  . BREAST SURGERY     Biopsy benign  . COMBINED MEDIASTINOSCOPY AND BRONCHOSCOPY  08/2007  . COMBINED MEDIASTINOSCOPY AND BRONCHOSCOPY  2009  . FRACTURE SURGERY  ?02/1997   "left upper arm; put rod in" (07/23/2012)  . GUM SURGERY  2000-?2009   "several ORs; soft  tissue graft; took material from roof of mouth" (07/23/2012  . KNEE ARTHROSCOPY  03/2004; 06/2009   "right; left" Dr. Theda Sers  . LYMPH NODE BIOPSY  ~ 2009   "for sarcoidosis; don't know exactly which nodes" (07/23/2102)  . MYOMECTOMY  1994   Open  . REFRACTIVE SURGERY  08/1998   "both eyes" (07/23/2012)  . TOTAL KNEE ARTHROPLASTY Left 09/24/2016   Procedure: TOTAL KNEE ARTHROPLASTY;  Surgeon: Dorna Leitz, MD;  Location: [facility];  Service: Orthopedics;  Laterality: Left;    There were no vitals filed for this visit.      Subjective Assessment - 11/13/16 0938    Subjective Pt reports no pain today just stiffness in her knee. Reported that she had no issues after last visit.    Currently in Pain? No/denies   Pain Score 0-No pain                         [facility] Adult PT Treatment/Exercise - 11/13/16 0001      Knee/Hip Exercises: Aerobic   Recumbent Bike Lvl 3 2 min/LvL2 4 min     Knee/Hip Exercises: Machines for Strengthening   Cybex Knee Extension 5lbs 2x10   Cybex Knee Flexion 25# 2x15   Cybex Leg Press 40lb 2x10   Total Gym Leg Press LLE 2 x 10 30#     Knee/Hip Exercises: Standing   Other

## 2016-11-15 ENCOUNTER — Ambulatory Visit: Payer: BLUE CROSS/BLUE SHIELD | Admitting: Physical Therapy

## 2016-11-15 ENCOUNTER — Encounter: Payer: Self-pay | Admitting: Physical Therapy

## 2016-11-15 DIAGNOSIS — M25562 Pain in left knee: Secondary | ICD-10-CM

## 2016-11-15 DIAGNOSIS — R2242 Localized swelling, mass and lump, left lower limb: Secondary | ICD-10-CM

## 2016-11-15 DIAGNOSIS — R262 Difficulty in walking, not elsewhere classified: Secondary | ICD-10-CM

## 2016-11-15 DIAGNOSIS — M25662 Stiffness of left knee, not elsewhere classified: Secondary | ICD-10-CM
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
## 2016-11-19 DIAGNOSIS — M25562 Pain in left knee: Secondary | ICD-10-CM | POA: Diagnosis not present

## 2016-11-20 ENCOUNTER — Ambulatory Visit: Payer: BLUE CROSS/BLUE SHIELD | Admitting: Physical Therapy

## 2016-11-20 DIAGNOSIS — M25562 Pain in left knee: Secondary | ICD-10-CM | POA: Diagnosis not present

## 2016-11-20 DIAGNOSIS — M25662 Stiffness of left knee, not elsewhere classified: Secondary | ICD-10-CM

## 2016-11-20 DIAGNOSIS — R2242 Localized swelling, mass and lump, left lower limb: Secondary | ICD-10-CM

## 2016-11-20 DIAGNOSIS — R262 Difficulty in walking, not elsewhere classified: Secondary | ICD-10-CM | POA: Diagnosis not present
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
## 2016-11-22 ENCOUNTER — Ambulatory Visit: Payer: BLUE CROSS/BLUE SHIELD | Admitting: Physical Therapy

## 2016-11-22 ENCOUNTER — Encounter: Payer: Self-pay | Admitting: Physical Therapy

## 2016-11-22 DIAGNOSIS — R262 Difficulty in walking, not elsewhere classified: Secondary | ICD-10-CM

## 2016-11-22 DIAGNOSIS — R2242 Localized swelling, mass and lump, left lower limb: Secondary | ICD-10-CM

## 2016-11-22 DIAGNOSIS — M25562 Pain in left knee: Secondary | ICD-10-CM | POA: Diagnosis not present

## 2016-11-22 DIAGNOSIS — M25662 Stiffness of left knee, not elsewhere classified: Secondary | ICD-10-CM | POA: Diagnosis not present
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
## 2016-11-27 ENCOUNTER — Ambulatory Visit: Payer: BLUE CROSS/BLUE SHIELD | Admitting: Physical Therapy

## 2016-11-27 ENCOUNTER — Encounter: Payer: Self-pay | Admitting: Physical Therapy

## 2016-11-27 DIAGNOSIS — M25662 Stiffness of left knee, not elsewhere classified: Secondary | ICD-10-CM | POA: Diagnosis not present

## 2016-11-27 DIAGNOSIS — M25562 Pain in left knee: Secondary | ICD-10-CM

## 2016-11-27 DIAGNOSIS — R262 Difficulty in walking, not elsewhere classified: Secondary | ICD-10-CM

## 2016-11-27 DIAGNOSIS — R2242 Localized swelling, mass and lump, left lower limb: Secondary | ICD-10-CM
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
## 2016-11-29 ENCOUNTER — Encounter: Payer: Self-pay | Admitting: Physical Therapy

## 2016-11-29 ENCOUNTER — Ambulatory Visit: Payer: BLUE CROSS/BLUE SHIELD | Admitting: Physical Therapy

## 2016-11-29 DIAGNOSIS — M25562 Pain in left knee: Secondary | ICD-10-CM

## 2016-11-29 DIAGNOSIS — R262 Difficulty in walking, not elsewhere classified: Secondary | ICD-10-CM

## 2016-11-29 DIAGNOSIS — M25662 Stiffness of left knee, not elsewhere classified: Secondary | ICD-10-CM

## 2016-11-29 DIAGNOSIS — R2242 Localized swelling, mass and lump, left lower limb: Secondary | ICD-10-CM
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
## 2016-12-07 DIAGNOSIS — H401113 Primary open-angle glaucoma, right eye, severe stage: Secondary | ICD-10-CM | POA: Diagnosis not present

## 2016-12-07 DIAGNOSIS — H401122 Primary open-angle glaucoma, left eye, moderate stage: Secondary | ICD-10-CM | POA: Diagnosis not present

## 2016-12-07 DIAGNOSIS — H2513 Age-related nuclear cataract, bilateral: Secondary | ICD-10-CM | POA: Diagnosis not present

## 2016-12-07 DIAGNOSIS — H04123 Dry eye syndrome of bilateral lacrimal glands: Secondary | ICD-10-CM | POA: Diagnosis not present

## 2016-12-14 DIAGNOSIS — H04129 Dry eye syndrome of unspecified lacrimal gland: Secondary | ICD-10-CM | POA: Insufficient documentation

## 2016-12-17 DIAGNOSIS — M25562 Pain in left knee: Secondary | ICD-10-CM | POA: Diagnosis not present

## 2016-12-21 ENCOUNTER — Encounter (HOSPITAL_COMMUNITY): Payer: Self-pay | Admitting: Orthopedic Surgery

## 2016-12-21 NOTE — Addendum Note (Signed)
Addendum  created 12/21/16 1052 by Lyn Hollingshead, MD   Sign clinical note

## 2016-12-25 ENCOUNTER — Other Ambulatory Visit: Payer: Self-pay | Admitting: Internal Medicine

## 2016-12-25 DIAGNOSIS — F900 Attention-deficit hyperactivity disorder, predominantly inattentive type: Secondary | ICD-10-CM | POA: Diagnosis not present

## 2016-12-25 DIAGNOSIS — F3342 Major depressive disorder, recurrent, in full remission: Secondary | ICD-10-CM | POA: Diagnosis not present

## 2016-12-28 DIAGNOSIS — N189 Chronic kidney disease, unspecified: Secondary | ICD-10-CM | POA: Diagnosis not present

## 2016-12-28 DIAGNOSIS — N183 Chronic kidney disease, stage 3 (moderate): Secondary | ICD-10-CM | POA: Diagnosis not present

## 2016-12-31 DIAGNOSIS — I129 Hypertensive chronic kidney disease with stage 1 through stage 4 chronic kidney disease, or unspecified chronic kidney disease: Secondary | ICD-10-CM | POA: Diagnosis not present

## 2016-12-31 DIAGNOSIS — D631 Anemia in chronic kidney disease: Secondary | ICD-10-CM | POA: Diagnosis not present

## 2016-12-31 DIAGNOSIS — N183 Chronic kidney disease, stage 3 (moderate): Secondary | ICD-10-CM | POA: Diagnosis not present

## 2017-01-07 ENCOUNTER — Encounter: Payer: Self-pay | Admitting: Family Medicine

## 2017-01-07 ENCOUNTER — Ambulatory Visit (INDEPENDENT_AMBULATORY_CARE_PROVIDER_SITE_OTHER): Payer: BLUE CROSS/BLUE SHIELD | Admitting: Family Medicine

## 2017-01-07 VITALS — BP 138/77 | HR 75 | Temp 98.9°F | Ht 66.0 in | Wt 223.6 lb

## 2017-01-07 DIAGNOSIS — N189 Chronic kidney disease, unspecified: Secondary | ICD-10-CM | POA: Diagnosis not present

## 2017-01-07 DIAGNOSIS — M25475 Effusion, left foot: Secondary | ICD-10-CM

## 2017-01-07 DIAGNOSIS — R5383 Other fatigue: Secondary | ICD-10-CM

## 2017-01-07 DIAGNOSIS — L03116 Cellulitis of left lower limb: Secondary | ICD-10-CM

## 2017-01-07 LAB — POCT CBC
GRANULOCYTE PERCENT: 69.1 % (ref 37–80)
HCT, POC: 36.5 % — AB (ref 37.7–47.9)
Hemoglobin: 12.2 g/dL (ref 12.2–16.2)
LYMPH, POC: 2.1 (ref 0.6–3.4)
MCH, POC: 26.8 pg — AB (ref 27–31.2)
MCHC: 33.3 g/dL (ref 31.8–35.4)
MCV: 80.6 fL (ref 80–97)
MID (CBC): 0.6 (ref 0–0.9)
MPV: 6.8 fL (ref 0–99.8)
PLATELET COUNT, POC: 219 10*3/uL (ref 142–424)
POC Granulocyte: 6 (ref 2–6.9)
POC LYMPH %: 24.1 % (ref 10–50)
POC MID %: 6.8 %M (ref 0–12)
RBC: 4.53 M/uL (ref 4.04–5.48)
RDW, POC: 16 %
WBC: 8.7 10*3/uL (ref 4.6–10.2)

## 2017-01-07 MED ORDER — CEPHALEXIN 500 MG PO CAPS: 500.0000 mg | ORAL_CAPSULE | Freq: Three times a day (TID) | ORAL | 0 refills | Status: DC
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
## 2017-01-07 NOTE — Patient Instructions (Addendum)
It does appear that you have a skin infection or cellulitis. Start Keflex 3 times per day. Depending on your kidney function tests, we may increase that to 4 times per day. I will also check your calcium as that has been reportedly elevated in the past and a blood count and other electrolytes for fatigue. I would recommend discussing calcium level with your nephrologist.   Recheck tomorrow for skin infection, but I would like you to call your primary care provider to discuss fatigue further and follow up on other chronic medical problems/medicines.   Return to the clinic or go to the nearest emergency room if any of your symptoms worsen or new symptoms occur.   Cellulitis, Adult Cellulitis is a skin infection. The infected area is usually red and tender. This condition occurs most often in the arms and lower legs. The infection can travel to the muscles, blood, and underlying tissue and become serious. It is very important to get treated for this condition. What are the causes? Cellulitis is caused by bacteria. The bacteria enter through a break in the skin, such as a cut, burn, insect bite, open sore, or crack. What increases the risk? This condition is more likely to occur in people who:  Have a weak defense system (immune system).  Have open wounds on the skin such as cuts, burns, bites, and scrapes. Bacteria can enter the body through these open wounds.  Are older.  Have diabetes.  Have a type of long-lasting (chronic) liver disease (cirrhosis) or kidney disease.  Use IV drugs.  What are the signs or symptoms? Symptoms of this condition include:  Redness, streaking, or spotting on the skin.  Swollen area of the skin.  Tenderness or pain when an area of the skin is touched.  Warm skin.  Fever.  Chills.  Blisters.  How is this diagnosed? This condition is diagnosed based on a medical history and physical exam. You may also have tests, including:  Blood tests.  Lab  tests.  Imaging tests.  How is this treated? Treatment for this condition may include:  Medicines, such as antibiotic medicines or antihistamines.  Supportive care, such as rest and application of cold or warm cloths (cold or warm compresses) to the skin.  Hospital care, if the condition is severe.  The infection usually gets better within 1-2 days of treatment. Follow these instructions at home:  Take over-the-counter and prescription medicines only as told by your health care provider.  If you were prescribed an antibiotic medicine, take it as told by your health care provider. Do not stop taking the antibiotic even if you start to feel better.  Drink enough fluid to keep your urine clear or pale yellow.  Do not touch or rub the infected area.  Raise (elevate) the infected area above the level of your heart while you are sitting or lying down.  Apply warm or cold compresses to the affected area as told by your health care provider.  Keep all follow-up visits as told by your health care provider. This is important. These visits let your health care provider make sure a more serious infection is not developing. Contact a health care provider if:  You have a fever.  Your symptoms do not improve within 1-2 days of starting treatment.  Your bone or joint underneath the infected area becomes painful after the skin has healed.  Your infection returns in the same area or another area.  You notice a swollen bump in the  infected area.  You develop new symptoms.  You have a general ill feeling (malaise) with muscle aches and pains. Get help right away if:  Your symptoms get worse.  You feel very sleepy.  You develop vomiting or diarrhea that persists.  You notice red streaks coming from the infected area.  Your red area gets larger or turns dark in color. This information is not intended to replace advice given to you by your health care provider. Make sure you discuss  any questions you have with your health care provider. Document Released: 04/11/2005 Document Revised: 11/10/2015 Document Reviewed: 05/11/2015 Elsevier Interactive Patient Education  2017 [employer].    IF you received an x-ray today, you will receive an invoice from [facility]. Please contact [facility]iology at [phone] with questions or concerns regarding your invoice.   IF you received labwork today, you will receive an invoice from [place]. Please contact [facility] at [phone] with questions or concerns regarding your invoice.   Our billing staff will not be able to assist you with questions regarding bills from these companies.  You will be contacted with the lab results as soon as they are available. The fastest way to get your results is to activate your My Chart account. Instructions are located on the last page of this paperwork. If you have not heard from [place] regarding the results in 2 weeks, please contact this office.    We recommend that you schedule a mammogram for breast cancer screening. Typically, you do not need a referral to do this. Please contact a local imaging center to schedule your mammogram.  [facility] - [phone]  *ask for the Radiology Department [facility] [place] ([place]) - [phone] or [phone]  [facility] - [phone] [place] [phone] [facility] - [phone]  *ask for the [facility] - [phone]  *ask for the Radiology Department [facility] - [phone]  *ask for the [place] - [phone]

## 2017-01-08 ENCOUNTER — Ambulatory Visit (INDEPENDENT_AMBULATORY_CARE_PROVIDER_SITE_OTHER): Payer: BLUE CROSS/BLUE SHIELD | Admitting: Family Medicine

## 2017-01-08 ENCOUNTER — Encounter: Payer: Self-pay | Admitting: Family Medicine

## 2017-01-08 VITALS — BP 113/67 | HR 98 | Temp 98.2°F | Resp 17 | Ht 66.0 in | Wt 220.0 lb

## 2017-01-08 DIAGNOSIS — L03116 Cellulitis of left lower limb: Secondary | ICD-10-CM | POA: Diagnosis not present

## 2017-01-08 DIAGNOSIS — R5383 Other fatigue: Secondary | ICD-10-CM

## 2017-01-08 DIAGNOSIS — N183 Chronic kidney disease, stage 3 unspecified: Secondary | ICD-10-CM

## 2017-01-08 LAB — CMP14+EGFR
A/G RATIO: 1.8 (ref 1.2–2.2)
ALT: 21 IU/L (ref 0–32)
AST: 21 IU/L (ref 0–40)
Albumin: 4.3 g/dL (ref 3.6–4.8)
Alkaline Phosphatase: 126 IU/L — ABNORMAL HIGH (ref 39–117)
BUN/Creatinine Ratio: 18 (ref 12–28)
BUN: 24 mg/dL (ref 8–27)
Bilirubin Total: 0.3 mg/dL (ref 0.0–1.2)
CALCIUM: 10.1 mg/dL (ref 8.7–10.3)
CO2: 23 mmol/L (ref 20–29)
CREATININE: 1.34 mg/dL — AB (ref 0.57–1.00)
Chloride: 96 mmol/L (ref 96–106)
GFR, EST AFRICAN AMERICAN: 48 mL/min/{1.73_m2} — AB (ref >59)
GFR, EST NON AFRICAN AMERICAN: 42 mL/min/{1.73_m2} — AB (ref >59)
Globulin, Total: 2.4 g/dL (ref 1.5–4.5)
Glucose: 110 mg/dL — ABNORMAL HIGH (ref 65–99)
POTASSIUM: 4.7 mmol/L (ref 3.5–5.2)
Sodium: 134 mmol/L (ref 134–144)
TOTAL PROTEIN: 6.7 g/dL (ref 6.0–8.5)
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
## 2017-01-08 NOTE — Patient Instructions (Addendum)
The skin infection in your foot appears to be improving.  Continue cephalexin 3 times per day, Tylenol over-the-counter if needed for milder pain. If you need something stronger for pain, your tramadol or half of a oxycodone would probably be okay. You can discuss this with your primary care provider at follow-up tomorrow. I did outline the redness again today for them to check tomorrow.  Return to the clinic or go to the nearest emergency room if any of your symptoms worsen or new symptoms occur.    Cellulitis, Adult Cellulitis is a skin infection. The infected area is usually red and tender. This condition occurs most often in the arms and lower legs. The infection can travel to the muscles, blood, and underlying tissue and become serious. It is very important to get treated for this condition. What are the causes? Cellulitis is caused by bacteria. The bacteria enter through a break in the skin, such as a cut, burn, insect bite, open sore, or crack. What increases the risk? This condition is more likely to occur in people who:  Have a weak defense system (immune system).  Have open wounds on the skin such as cuts, burns, bites, and scrapes. Bacteria can enter the body through these open wounds.  Are older.  Have diabetes.  Have a type of long-lasting (chronic) liver disease (cirrhosis) or kidney disease.  Use IV drugs.  What are the signs or symptoms? Symptoms of this condition include:  Redness, streaking, or spotting on the skin.  Swollen area of the skin.  Tenderness or pain when an area of the skin is touched.  Warm skin.  Fever.  Chills.  Blisters.  How is this diagnosed? This condition is diagnosed based on a medical history and physical exam. You may also have tests, including:  Blood tests.  Lab tests.  Imaging tests.  How is this treated? Treatment for this condition may include:  Medicines, such as antibiotic medicines or  antihistamines.  Supportive care, such as rest and application of cold or warm cloths (cold or warm compresses) to the skin.  Hospital care, if the condition is severe.  The infection usually gets better within 1-2 days of treatment. Follow these instructions at home:  Take over-the-counter and prescription medicines only as told by your health care provider.  If you were prescribed an antibiotic medicine, take it as told by your health care provider. Do not stop taking the antibiotic even if you start to feel better.  Drink enough fluid to keep your urine clear or pale yellow.  Do not touch or rub the infected area.  Raise (elevate) the infected area above the level of your heart while you are sitting or lying down.  Apply warm or cold compresses to the affected area as told by your health care provider.  Keep all follow-up visits as told by your health care provider. This is important. These visits let your health care provider make sure a more serious infection is not developing. Contact a health care provider if:  You have a fever.  Your symptoms do not improve within 1-2 days of starting treatment.  Your bone or joint underneath the infected area becomes painful after the skin has healed.  Your infection returns in the same area or another area.  You notice a swollen bump in the infected area.  You develop new symptoms.  You have a general ill feeling (malaise) with muscle aches and pains. Get help right away if:  Your symptoms get  worse.  You feel very sleepy.  You develop vomiting or diarrhea that persists.  You notice red streaks coming from the infected area.  Your red area gets larger or turns dark in color. This information is not intended to replace advice given to you by your health care provider. Make sure you discuss any questions you have with your health care provider. Document Released: 04/11/2005 Document Revised: 11/10/2015 Document Reviewed:  05/11/2015 Elsevier Interactive Patient Education  2017 [employer].     IF you received an x-ray today, you will receive an invoice from [facility]. Please contact [facility] at [phone] with questions or concerns regarding your invoice.   IF you received labwork today, you will receive an invoice from [place]. Please contact [facility] at [phone] with questions or concerns regarding your invoice.   Our billing staff will not be able to assist you with questions regarding bills from these companies.  You will be contacted with the lab results as soon as they are available. The fastest way to get your results is to activate your My Chart account. Instructions are located on the last page of this paperwork. If you have not heard from [place] regarding the results in 2 weeks, please contact this office.

## 2017-01-09 ENCOUNTER — Ambulatory Visit (INDEPENDENT_AMBULATORY_CARE_PROVIDER_SITE_OTHER): Payer: BLUE CROSS/BLUE SHIELD | Admitting: Internal Medicine

## 2017-01-09 ENCOUNTER — Encounter: Payer: Self-pay | Admitting: Internal Medicine

## 2017-01-09 VITALS — BP 138/88 | HR 72 | Ht 66.0 in | Wt 218.0 lb

## 2017-01-09 DIAGNOSIS — L03116 Cellulitis of left lower limb: Secondary | ICD-10-CM | POA: Diagnosis not present

## 2017-01-09 DIAGNOSIS — N184 Chronic kidney disease, stage 4 (severe): Secondary | ICD-10-CM | POA: Diagnosis not present

## 2017-01-09 DIAGNOSIS — G4710 Hypersomnia, unspecified: Secondary | ICD-10-CM | POA: Diagnosis not present

## 2017-01-09 DIAGNOSIS — I10 Essential (primary) hypertension: Secondary | ICD-10-CM | POA: Diagnosis not present

## 2017-01-09 NOTE — Progress Notes (Signed)
Subjective:    Patient ID: Tara [name], female    DOB: 1950-09-14, 66 y.o.   MRN: [id number]  HPI Here to f/u left foot cellulitis after initial tx 6/25 at [facility] and f/u 6/26; initial ink to left foot with dates indicates improvement over this 2 days, and since has cont;d to improve as well with less red, tender, swelling; no skin ulcerations, drainage or worsening red streaks.  Does c/o ongoing fatigue, and has signficant daytime hypersomnolence, and remains chronic and distressing.  Pt denies fever, wt loss, night sweats, loss of appetite, or other constitutional symptoms Past Medical History:  Diagnosis Date  . Anemia   . ANKLE PAIN, LEFT 04/01/2008  . ANXIETY 04/17/2007  . Arthritis   . Chronic kidney disease   . COLONIC POLYPS, HX OF 08/01/2007  . CONTUSIONS, MULTIPLE 04/01/2009  . DEPRESSION 04/17/2007  . DIZZINESS 08/01/2007  . DYSPNEA 08/01/2007   with exertion  . Enlargement of lymph nodes 08/13/2007  . GLUCOSE INTOLERANCE 08/01/2007  . 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 HYPERLIPIDEMIA 08/01/2007  . HYPERSOMNIA 07/28/2008  . HYPERTENSION 04/17/2007  . Impaired glucose tolerance 03/23/2011  . JOINT EFFUSION, LEFT KNEE 06/02/2010  . Loose body in knee 04/01/2009  . Migraines    "stopped 3-4 yr ago" (07/23/2012)  . Morbid obesity ([place]) 04/20/2007  . OTHER DISEASES OF LUNG NOT ELSEWHERE CLASSIFIED 08/01/2007  . Pain in joint, lower leg 04/01/2009  . PERIPHERAL EDEMA 04/21/2009  . Pre-diabetes   . Sarcoidosis 09/25/2007  . SHOULDER PAIN, LEFT 04/01/2009  . Sleep apnea    does not use Cpap   Past Surgical History:  Procedure Laterality Date  . BREAST SURGERY     Biopsy benign  . COMBINED MEDIASTINOSCOPY AND BRONCHOSCOPY  08/2007  . COMBINED MEDIASTINOSCOPY AND BRONCHOSCOPY  2009  . FRACTURE SURGERY  ?02/1997   "left upper arm; put rod in" (07/23/2012)  . GUM SURGERY  2000-?2009   "several ORs; soft tissue graft; took material from roof of mouth" (07/23/2012  . KNEE ARTHROSCOPY  03/2004;  06/2009   "right; left" Dr. Theda Sers  . LYMPH NODE BIOPSY  ~ 2009   "for sarcoidosis; don't know exactly which nodes" (07/23/2102)  . MYOMECTOMY  1994   Open  . REFRACTIVE SURGERY  08/1998   "both eyes" (07/23/2012)  . TOTAL KNEE ARTHROPLASTY Left 09/24/2016   Procedure: TOTAL KNEE ARTHROPLASTY;  Surgeon: Dorna Leitz, MD;  Location: [place];  Service: Orthopedics;  Laterality: Left;    reports that she has never smoked. She has never used smokeless tobacco. She reports that she drinks alcohol. She reports that she does not use drugs. family history includes Asthma in her sister; Diabetes in her mother; Heart disease (age of onset: 60) in her father; Hypertension in her father, mother, and sister; Stroke in her mother. Allergies  Allergen Reactions  . Fluoxetine Hcl Other (See Comments)    (PROZAC) Suicidal thoughts  . Chocolate Other (See Comments)    SOMETIMES CAUSES SEVERE HEADACHES  . Floxin [Ocuflox] Anxiety    shaky  . Other Nausea And Vomiting    INSTANT ICED TEA PACKETS  . Sulfonamide Derivatives Rash   Current Outpatient Prescriptions on File Prior to Visit  Medication Sig Dispense Refill  . amphetamine-dextroamphetamine (ADDERALL XR) 30 MG 24 hr capsule Take 1 capsule (30 mg total) by mouth daily. 10 capsule 0  . amphetamine-dextroamphetamine (ADDERALL) 30 MG tablet Take 30 mg by mouth 2 (two) times daily.    Marland Kitchen antiseptic  oral rinse (BIOTENE) LIQD 15 mLs by Mouth Rinse route 2 (two) times daily as needed for dry mouth.    Marland Kitchen aspirin EC 81 MG tablet Take 81 mg by mouth daily.    Marland Kitchen atenolol (TENORMIN) 50 MG tablet TAKE 1 TABLET(50 MG) BY MOUTH DAILY (Patient taking differently: Take 50 mg by mouth at bedtime. ) 90 tablet 3  . Carboxymethylcell-Hypromellose (GENTEAL OP) Place 1-2 drops into both eyes 3 (three) times daily as needed (for dry eyes/irritated eyes).    . cloNIDine (CATAPRES) 0.1 MG tablet Take 0.1 mg by mouth 2 (two) times daily.    . clorazepate (TRANXENE) 7.5 MG tablet  Take 7.5 mg by mouth 3 (three) times daily.    Marland Kitchen desvenlafaxine (PRISTIQ) 100 MG 24 hr tablet Take 100 mg by mouth at bedtime.    Marland Kitchen DILT-XR 240 MG 24 hr capsule TAKE 1 CAPSULE(240 MG) BY MOUTH DAILY 90 capsule 0  . docusate sodium (COLACE) 100 MG capsule Take 1 capsule (100 mg total) by mouth 2 (two) times daily. 30 capsule 0  . ferrous sulfate 325 (65 FE) MG tablet Take 325 mg by mouth daily with breakfast.    . furosemide (LASIX) 40 MG tablet Take 1 tablet (40 mg total) by mouth 2 (two) times daily. (Patient taking differently: Take 40 mg by mouth daily. ) 30 tablet 0  . hydrALAZINE (APRESOLINE) 100 MG tablet Take 1 tablet (100 mg total) by mouth every 8 (eight) hours. (Patient taking differently: Take 100 mg by mouth 3 (three) times daily. )    . latanoprost (XALATAN) 0.005 % ophthalmic solution Place 1 drop into both eyes at bedtime.    . Melatonin 5 MG TABS Take 10 mg by mouth at bedtime.    . Multiple Vitamins-Minerals (MULTIVITAMIN WITH MINERALS) tablet Take 1 tablet by mouth daily.    . Omega 3 1200 MG CAPS Take 2,400 mg by mouth at bedtime.    Marland Kitchen oxyCODONE-acetaminophen (PERCOCET/ROXICET) 5-325 MG tablet Take 1-2 tablets by mouth every 4 (four) hours as needed for severe pain. 60 tablet 0  . timolol (TIMOPTIC) 0.5 % ophthalmic solution Place 1 drop into both eyes 2 (two) times daily.  0  . traMADol (ULTRAM) 50 MG tablet Take 1 tablet (50 mg total) by mouth every 12 (twelve) hours as needed. 60 tablet 0  . zaleplon (SONATA) 10 MG capsule Take 10 mg by mouth at bedtime as needed for sleep.     No current facility-administered medications on file prior to visit.    Review of Systems  Constitutional: Negative for other unusual diaphoresis or sweats HENT: Negative for ear discharge or swelling Eyes: Negative for other worsening visual disturbances Respiratory: Negative for stridor or other swelling  Gastrointestinal: Negative for worsening distension or other blood Genitourinary: Negative  for retention or other urinary change Musculoskeletal: Negative for other MSK pain or swelling Skin: Negative for color change or other new lesions Neurological: Negative for worsening tremors and other numbness  Psychiatric/Behavioral: Negative for worsening agitation or other fatigue All other system eng per pt    Objective:   Physical Exam BP 138/88   Pulse 72   Ht 5\' 6"  (1.676 m)   Wt 218 lb (98.9 kg)   SpO2 98%   BMI 35.19 kg/m  VS noted,  Constitutional: Pt appears in NAD HENT: Head: NCAT.  Right Ear: External ear normal.  Left Ear: External ear normal.  Eyes: . Pupils are equal, round, and reactive to light. Conjunctivae and  EOM are normal Nose: without d/c or deformity Neck: Neck supple. Gross normal ROM Cardiovascular: Normal rate and regular rhythm.   Pulmonary/Chest: Effort normal and breath sounds without rales or wheezing.  Abd:  Soft, NT, ND, + BS, no organomegaly Neurological: Pt is alert. At baseline orientation, motor grossly intact Skin: Skin is warm. No rashes, other new lesions, no LE edema except for distal half left dorsal foot with mild erythema, tender without fluctuance Psychiatric: Pt behavior is normal without agitation  No other exam findings Lab Results  Component Value Date   WBC 8.7 01/07/2017   HGB 12.2 01/07/2017   HCT 36.5 (A) 01/07/2017   PLT 210 09/26/2016   GLUCOSE 110 (H) 01/07/2017   CHOL 157 03/29/2016   TRIG 166.0 (H) 03/29/2016   HDL 46.00 03/29/2016   LDLDIRECT 42.0 09/27/2015   LDLCALC 78 03/29/2016   ALT 21 01/07/2017   AST 21 01/07/2017   NA 134 01/07/2017   K 4.7 01/07/2017   CL 96 01/07/2017   CREATININE 1.34 (H) 01/07/2017   BUN 24 01/07/2017   CO2 23 01/07/2017   TSH 4.72 (H) 09/27/2015   INR 0.98 09/17/2016   HGBA1C 5.7 (H) 09/17/2016   MICROALBUR 1.8 09/27/2015       Assessment & Plan:

## 2017-01-09 NOTE — Patient Instructions (Signed)
Please continue all other medications as before, and refills have been done if requested.  Please have the pharmacy call with any other refills you may need.  Please continue your efforts at being more active, low cholesterol diet, and weight control.  Please keep your appointments with your specialists as you may have planned  You will be contacted regarding the referral for: Pulmonary for possible sleep apnea

## 2017-01-10 ENCOUNTER — Encounter: Payer: Self-pay | Admitting: Internal Medicine

## 2017-01-12 ENCOUNTER — Encounter: Payer: Self-pay | Admitting: Family Medicine

## 2017-01-12 ENCOUNTER — Ambulatory Visit (INDEPENDENT_AMBULATORY_CARE_PROVIDER_SITE_OTHER): Payer: BLUE CROSS/BLUE SHIELD | Admitting: Family Medicine

## 2017-01-12 VITALS — BP 125/78 | HR 67 | Temp 98.7°F | Resp 16 | Ht 66.0 in | Wt 214.4 lb

## 2017-01-12 DIAGNOSIS — L03116 Cellulitis of left lower limb: Secondary | ICD-10-CM | POA: Insufficient documentation

## 2017-01-12 DIAGNOSIS — L03119 Cellulitis of unspecified part of limb: Secondary | ICD-10-CM

## 2017-01-12 DIAGNOSIS — G4710 Hypersomnia, unspecified: Secondary | ICD-10-CM | POA: Insufficient documentation

## 2017-01-12 MED ORDER — DOXYCYCLINE HYCLATE 100 MG PO TABS: 100.0000 mg | ORAL_TABLET | Freq: Two times a day (BID) | ORAL | 0 refills | Status: DC

## 2017-01-12 NOTE — Assessment & Plan Note (Signed)
Mod persistent, cant r/o osa, for refer pulmonary

## 2017-01-12 NOTE — Assessment & Plan Note (Signed)
Mild to mod, for antibx course,  to f/u any worsening symptoms or concerns 
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
## 2017-01-12 NOTE — Assessment & Plan Note (Signed)
stable overall by history and exam, recent data reviewed with pt, and pt to continue medical treatment as before,  to f/u any worsening symptoms or concerns BP Readings from Last 3 Encounters:  01/09/17 138/88  01/08/17 113/67  01/07/17 138/77  for f/u labs today as ordered

## 2017-01-12 NOTE — Patient Instructions (Addendum)
Try to avoid prolonged walking on the affected foot. Elevation when you are seated can help with the swelling. I will add doxycycline 1 pill twice per day, but continue to take Keflex to treat skin infection. Recheck in 48 hours.  Return to the clinic or go to the nearest emergency room if any of your symptoms worsen or new symptoms occur.   Cellulitis, Adult Cellulitis is a skin infection. The infected area is usually red and tender. This condition occurs most often in the arms and lower legs. The infection can travel to the muscles, blood, and underlying tissue and become serious. It is very important to get treated for this condition. What are the causes? Cellulitis is caused by bacteria. The bacteria enter through a break in the skin, such as a cut, burn, insect bite, open sore, or crack. What increases the risk? This condition is more likely to occur in people who:  Have a weak defense system (immune system).  Have open wounds on the skin such as cuts, burns, bites, and scrapes. Bacteria can enter the body through these open wounds.  Are older.  Have diabetes.  Have a type of long-lasting (chronic) liver disease (cirrhosis) or kidney disease.  Use IV drugs.  What are the signs or symptoms? Symptoms of this condition include:  Redness, streaking, or spotting on the skin.  Swollen area of the skin.  Tenderness or pain when an area of the skin is touched.  Warm skin.  Fever.  Chills.  Blisters.  How is this diagnosed? This condition is diagnosed based on a medical history and physical exam. You may also have tests, including:  Blood tests.  Lab tests.  Imaging tests.  How is this treated? Treatment for this condition may include:  Medicines, such as antibiotic medicines or antihistamines.  Supportive care, such as rest and application of cold or warm cloths (cold or warm compresses) to the skin.  Hospital care, if the condition is severe.  The infection  usually gets better within 1-2 days of treatment. Follow these instructions at home:  Take over-the-counter and prescription medicines only as told by your health care provider.  If you were prescribed an antibiotic medicine, take it as told by your health care provider. Do not stop taking the antibiotic even if you start to feel better.  Drink enough fluid to keep your urine clear or pale yellow.  Do not touch or rub the infected area.  Raise (elevate) the infected area above the level of your heart while you are sitting or lying down.  Apply warm or cold compresses to the affected area as told by your health care provider.  Keep all follow-up visits as told by your health care provider. This is important. These visits let your health care provider make sure a more serious infection is not developing. Contact a health care provider if:  You have a fever.  Your symptoms do not improve within 1-2 days of starting treatment.  Your bone or joint underneath the infected area becomes painful after the skin has healed.  Your infection returns in the same area or another area.  You notice a swollen bump in the infected area.  You develop new symptoms.  You have a general ill feeling (malaise) with muscle aches and pains. Get help right away if:  Your symptoms get worse.  You feel very sleepy.  You develop vomiting or diarrhea that persists.  You notice red streaks coming from the infected area.  Your  red area gets larger or turns dark in color. This information is not intended to replace advice given to you by your health care provider. Make sure you discuss any questions you have with your health care provider. Document Released: 04/11/2005 Document Revised: 11/10/2015 Document Reviewed: 05/11/2015 Elsevier Interactive Patient Education  2017 [employer].    IF you received an x-ray today, you will receive an invoice from [facility] Radiology. Please contact [facility]  Radiology at [phone] with questions or concerns regarding your invoice.   IF you received labwork today, you will receive an invoice from [place]. Please contact [facility] at [phone] with questions or concerns regarding your invoice.   Our billing staff will not be able to assist you with questions regarding bills from these companies.  You will be contacted with the lab results as soon as they are available. The fastest way to get your results is to activate your My Chart account. Instructions are located on the last page of this paperwork. If you have not heard from [place] regarding the results in 2 weeks, please contact this office.

## 2017-01-12 NOTE — Assessment & Plan Note (Signed)
stable overall by history and exam, recent data reviewed with pt, and pt to continue medical treatment as before,  to f/u any worsening symptoms or concerns Lab Results  Component Value Date   CREATININE 1.34 (H) 01/07/2017

## 2017-01-13 LAB — CBC
HEMOGLOBIN: 13 g/dL (ref 11.1–15.9)
Hematocrit: 39.2 % (ref 34.0–46.6)
MCH: 26.3 pg — ABNORMAL LOW (ref 26.6–33.0)
MCHC: 33.2 g/dL (ref 31.5–35.7)
MCV: 79 fL (ref 79–97)
Platelets: 291 10*3/uL (ref 150–379)
RBC: 4.95 x10E6/uL (ref 3.77–5.28)
RDW: 15.9 % — ABNORMAL HIGH (ref 12.3–15.4)
WBC: 6.7 10*3/uL (ref 3.4–10.8)

## 2017-01-13 LAB — SEDIMENTATION RATE: SED RATE: 5 mm/h (ref 0–40)

## 2017-01-14 ENCOUNTER — Encounter: Payer: Self-pay | Admitting: Family Medicine

## 2017-01-14 ENCOUNTER — Ambulatory Visit (INDEPENDENT_AMBULATORY_CARE_PROVIDER_SITE_OTHER): Payer: BLUE CROSS/BLUE SHIELD

## 2017-01-14 ENCOUNTER — Ambulatory Visit (INDEPENDENT_AMBULATORY_CARE_PROVIDER_SITE_OTHER): Payer: BLUE CROSS/BLUE SHIELD | Admitting: Family Medicine

## 2017-01-14 VITALS — BP 131/81 | HR 77 | Temp 97.4°F | Resp 16 | Ht 66.0 in | Wt 212.0 lb

## 2017-01-14 DIAGNOSIS — L03119 Cellulitis of unspecified part of limb: Secondary | ICD-10-CM

## 2017-01-14 DIAGNOSIS — M7989 Other specified soft tissue disorders: Secondary | ICD-10-CM

## 2017-01-14 DIAGNOSIS — M79675 Pain in left toe(s): Secondary | ICD-10-CM | POA: Diagnosis not present

## 2017-01-14 DIAGNOSIS — M19072 Primary osteoarthritis, left ankle and foot: Secondary | ICD-10-CM | POA: Diagnosis not present

## 2017-01-14 MED ORDER — PREDNISONE 20 MG PO TABS: 40.0000 mg | ORAL_TABLET | Freq: Every day | ORAL | 0 refills | Status: DC

## 2017-01-14 NOTE — Patient Instructions (Addendum)
Based on the area of swelling today, your foot pain could be coming from gout. I would like you to continue the antibiotics, but start prednisone to help with the inflammation and pain. If you have any hallucinations, or worsening anxiety be seen here or at the emergency room right away.  If your foot swelling is not improving in the next 3 days, please return for recheck with Harrison Mons, PA-C.  Return to the clinic or go to the nearest emergency room if any of your symptoms worsen or new symptoms occur.   IF you received an x-ray today, you will receive an invoice from [facility] Radiology. Please contact [facility] Radiology at [phone] with questions or concerns regarding your invoice.   IF you received labwork today, you will receive an invoice from [facility]. Please contact [facility] at [phone] with questions or concerns regarding your invoice.   Our billing staff will not be able to assist you with questions regarding bills from these companies.  You will be contacted with the lab results as soon as they are available. The fastest way to get your results is to activate your My Chart account. Instructions are located on the last page of this paperwork. If you have not heard from [place] regarding the results in 2 weeks, please contact this office.    We recommend that you schedule a mammogram for breast cancer screening. Typically, you do not need a referral to do this. Please contact a local imaging center to schedule your mammogram.  [facility] - [phone]  *ask for the Radiology Department [facility] - [phone] or [phone]  [facility] - [phone] [place] [phone] [facility] - [phone]  *ask for the [facility] - [phone]  *ask for the Radiology Department [facility] - [phone]  *ask for the [place] - [phone]
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
## 2017-01-15 LAB — URIC ACID: Uric Acid: 7.6 mg/dL — ABNORMAL HIGH (ref 2.5–7.1)

## 2017-01-17 ENCOUNTER — Ambulatory Visit (INDEPENDENT_AMBULATORY_CARE_PROVIDER_SITE_OTHER): Payer: BLUE CROSS/BLUE SHIELD | Admitting: Physician Assistant

## 2017-01-17 ENCOUNTER — Encounter: Payer: Self-pay | Admitting: Physician Assistant

## 2017-01-17 VITALS — BP 111/70 | HR 92 | Temp 98.8°F | Resp 16 | Ht 66.0 in | Wt 220.0 lb

## 2017-01-17 DIAGNOSIS — M7989 Other specified soft tissue disorders: Secondary | ICD-10-CM | POA: Diagnosis not present

## 2017-01-17 DIAGNOSIS — M79675 Pain in left toe(s): Secondary | ICD-10-CM

## 2017-01-17 DIAGNOSIS — M109 Gout, unspecified: Secondary | ICD-10-CM | POA: Diagnosis not present

## 2017-01-17 NOTE — Patient Instructions (Addendum)
You do not need a taper for this prednisone. When you complete the supply (total of 5 days), simply stop it.  Schedule a visit with Dr. [name] to discuss treatment to prevent future gout attacks.    IF you received an x-ray today, you will receive an invoice from [facility]. Please contact [facility] at [phone] with questions or concerns regarding your invoice.   IF you received labwork today, you will receive an invoice from [facility]. Please contact [facility] at [phone] with questions or concerns regarding your invoice.   Our billing staff will not be able to assist you with questions regarding bills from these companies.  You will be contacted with the lab results as soon as they are available. The fastest way to get your results is to activate your My Chart account. Instructions are located on the last page of this paperwork. If you have not heard from [place] regarding the results in 2 weeks, please contact this office.     Gout Gout is painful swelling that can occur in some of your joints. Gout is a type of arthritis. This condition is caused by having too much uric acid in your body. Uric acid is a chemical that forms when your body breaks down substances called purines. Purines are important for building body proteins. When your body has too much uric acid, sharp crystals can form and build up inside your joints. This causes pain and swelling. Gout attacks can happen quickly and be very painful (acute gout). Over time, the attacks can affect more joints and become more frequent (chronic gout). Gout can also cause uric acid to build up under your skin and inside your kidneys. What are the causes? This condition is caused by too much uric acid in your blood. This can occur because:  Your kidneys do not remove enough uric acid from your blood. This is the most common cause.  Your body makes too much uric acid. This can occur with some cancers and cancer treatments.  It can also occur if your body is breaking down too many red blood cells (hemolytic anemia).  You eat too many foods that are high in purines. These foods include organ meats and some seafood. Alcohol, especially beer, is also high in purines.  A gout attack may be triggered by trauma or stress. What increases the risk? This condition is more likely to develop in people who:  Have a family history of gout.  Are female and middle-aged.  Are female and have gone through menopause.  Are obese.  Frequently drink alcohol, especially beer.  Are dehydrated.  Lose weight too quickly.  Have an organ transplant.  Have lead poisoning.  Take certain medicines, including aspirin, cyclosporine, diuretics, levodopa, and niacin.  Have kidney disease or psoriasis.  What are the signs or symptoms? An attack of acute gout happens quickly. It usually occurs in just one joint. The most common place is the big toe. Attacks often start at night. Other joints that may be affected include joints of the feet, ankle, knee, fingers, wrist, or elbow. Symptoms may include:  Severe pain.  Warmth.  Swelling.  Stiffness.  Tenderness. The affected joint may be very painful to touch.  Shiny, red, or purple skin.  Chills and fever.  Chronic gout may cause symptoms more frequently. More joints may be involved. You may also have white or yellow lumps (tophi) on your hands or feet or in other areas near your joints. How is this diagnosed? This  condition is diagnosed based on your symptoms, medical history, and physical exam. You may have tests, such as:  Blood tests to measure uric acid levels.  Removal of joint fluid with a needle (aspiration) to look for uric acid crystals.  X-rays to look for joint damage.  How is this treated? Treatment for this condition has two phases: treating an acute attack and preventing future attacks. Acute gout treatment may include medicines to reduce pain and  swelling, including:  NSAIDs.  Steroids. These are strong anti-inflammatory medicines that can be taken by mouth (orally) or injected into a joint.  Colchicine. This medicine relieves pain and swelling when it is taken soon after an attack. It can be given orally or through an IV tube.  Preventive treatment may include:  Daily use of smaller doses of NSAIDs or colchicine.  Use of a medicine that reduces uric acid levels in your blood.  Changes to your diet. You may need to see a specialist about healthy eating (dietitian).  Follow these instructions at home: During a Gout Attack  If directed, apply ice to the affected area: ? Put ice in a plastic bag. ? Place a towel between your skin and the bag. ? Leave the ice on for 20 minutes, 2-3 times a day.  Rest the joint as much as possible. If the affected joint is in your leg, you may be given crutches to use.  Raise (elevate) the affected joint above the level of your heart as often as possible.  Drink enough fluids to keep your urine clear or pale yellow.  Take over-the-counter and prescription medicines only as told by your health care provider.  Do not drive or operate heavy machinery while taking prescription pain medicine.  Follow instructions from your health care provider about eating or drinking restrictions.  Return to your normal activities as told by your health care provider. Ask your health care provider what activities are safe for you. Avoiding Future Gout Attacks  Follow a low-purine diet as told by your dietitian or health care provider. Avoid foods and drinks that are high in purines, including liver, kidney, anchovies, asparagus, herring, mushrooms, mussels, and beer.  Limit alcohol intake to no more than 1 drink a day for nonpregnant women and 2 drinks a day for men. One drink equals 12 oz of beer, 5 oz of wine, or 1 oz of hard liquor.  Maintain a healthy weight or lose weight if you are overweight. If you  want to lose weight, talk with your health care provider. It is important that you do not lose weight too quickly.  Start or maintain an exercise program as told by your health care provider.  Drink enough fluids to keep your urine clear or pale yellow.  Take over-the-counter and prescription medicines only as told by your health care provider.  Keep all follow-up visits as told by your health care provider. This is important. Contact a health care provider if:  You have another gout attack.  You continue to have symptoms of a gout attack after10 days of treatment.  You have side effects from your medicines.  You have chills or a fever.  You have burning pain when you urinate.  You have pain in your lower back or belly. Get help right away if:  You have severe or uncontrolled pain.  You cannot urinate. This information is not intended to replace advice given to you by your health care provider. Make sure you discuss any questions you  have with your health care provider. Document Released: 06/29/2000 Document Revised: 12/08/2015 Document Reviewed: 04/14/2015 Elsevier Interactive Patient Education  2017 [employer] are compounds that affect the level of uric acid in your body. A low-purine diet is a diet that is low in purines. Eating a low-purine diet can prevent the level of uric acid in your body from getting too high and causing gout or kidney stones or both. What do I need to know about this diet?  Choose low-purine foods. Examples of low-purine foods are listed in the next section.  Drink plenty of fluids, especially water. Fluids can help remove uric acid from your body. Try to drink 8-16 cups (1.9-3.8 L) a day.  Limit foods high in fat, especially saturated fat, as fat makes it harder for the body to get rid of uric acid. Foods high in saturated fat include pizza, cheese, ice cream, whole milk, fried foods, and gravies. Choose foods that are  lower in fat and lean sources of protein. Use olive oil when cooking as it contains healthy fats that are not high in saturated fat.  Limit alcohol. Alcohol interferes with the elimination of uric acid from your body. If you are having a gout attack, avoid all alcohol.  Keep in mind that different people's bodies react differently to different foods. You will probably learn over time which foods do or do not affect you. If you discover that a food tends to cause your gout to flare up, avoid eating that food. You can more freely enjoy foods that do not cause problems. If you have any questions about a food item, talk to your dietitian or health care provider. Which foods are low, moderate, and high in purines? The following is a list of foods that are low, moderate, and high in purines. You can eat any amount of the foods that are low in purines. You may be able to have small amounts of foods that are moderate in purines. Ask your health care provider how much of a food moderate in purines you can have. Avoid foods high in purines. Grains  Foods low in purines: Enriched white bread, pasta, rice, cake, cornbread, popcorn.  Foods moderate in purines: Whole-grain breads and cereals, wheat germ, bran, oatmeal. Uncooked oatmeal. Dry wheat bran or wheat germ.  Foods high in purines: Pancakes, [place] toast, biscuits, muffins. Vegetables  Foods low in purines: All vegetables, except those that are moderate in purines.  Foods moderate in purines: Asparagus, cauliflower, spinach, mushrooms, green peas. Fruits  All fruits are low in purines. Meats and other Protein Foods  Foods low in purines: Eggs, nuts, peanut butter.  Foods moderate in purines: 80-90% lean beef, lamb, veal, pork, poultry, fish, eggs, peanut butter, nuts. Crab, lobster, oysters, and shrimp. Cooked dried beans, peas, and lentils.  Foods high in purines: Anchovies, sardines, herring, mussels, tuna, codfish, scallops, trout, and  haddock. Berniece Salines. Organ meats (such as liver or kidney). Tripe. Game meat. Goose. Sweetbreads. Dairy  All dairy foods are low in purines. Low-fat and fat-free dairy products are best because they are low in saturated fat. Beverages  Drinks low in purines: Water, carbonated beverages, tea, coffee, cocoa.  Drinks moderate in purines: Soft drinks and other drinks sweetened with high-fructose corn syrup. Juices. To find whether a food or drink is sweetened with high-fructose corn syrup, look at the ingredients list.  Drinks high in purines: Alcoholic beverages (such as beer). Condiments  Foods low in purines: Salt, herbs, olives,  pickles, relishes, vinegar.  Foods moderate in purines: Butter, margarine, oils, mayonnaise. Fats and Oils  Foods low in purines: All types, except gravies and sauces made with meat.  Foods high in purines: Gravies and sauces made with meat. Other Foods  Foods low in purines: Sugars, sweets, gelatin. Cake. Soups made without meat.  Foods moderate in purines: Meat-based or fish-based soups, broths, or bouillons. Foods and drinks sweetened with high-fructose corn syrup.  Foods high in purines: High-fat desserts (such as ice cream, cookies, cakes, pies, doughnuts, and chocolate). Contact your dietitian for more information on foods that are not listed here. This information is not intended to replace advice given to you by your health care provider. Make sure you discuss any questions you have with your health care provider. Document Released: 10/27/2010 Document Revised: 12/08/2015 Document Reviewed: 06/08/2013 Elsevier Interactive Patient Education  2017 [employer].
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
## 2017-01-20 ENCOUNTER — Encounter: Payer: Self-pay | Admitting: Internal Medicine

## 2017-01-20 DIAGNOSIS — Z794 Long term (current) use of insulin: Principal | ICD-10-CM

## 2017-01-20 DIAGNOSIS — E119 Type 2 diabetes mellitus without complications: Secondary | ICD-10-CM

## 2017-01-21 ENCOUNTER — Encounter: Payer: Self-pay | Admitting: Internal Medicine

## 2017-01-21 MED ORDER — CLONIDINE HCL 0.1 MG PO TABS: 0.1000 mg | ORAL_TABLET | Freq: Two times a day (BID) | ORAL | 3 refills | Status: DC

## 2017-01-30 ENCOUNTER — Encounter: Payer: Self-pay | Admitting: Internal Medicine

## 2017-01-30 ENCOUNTER — Ambulatory Visit (INDEPENDENT_AMBULATORY_CARE_PROVIDER_SITE_OTHER): Payer: BLUE CROSS/BLUE SHIELD | Admitting: Internal Medicine

## 2017-01-30 VITALS — BP 134/88 | HR 81 | Ht 66.0 in | Wt 222.0 lb

## 2017-01-30 DIAGNOSIS — T380X5A Adverse effect of glucocorticoids and synthetic analogues, initial encounter: Secondary | ICD-10-CM

## 2017-01-30 DIAGNOSIS — R609 Edema, unspecified: Secondary | ICD-10-CM | POA: Diagnosis not present

## 2017-01-30 DIAGNOSIS — E099 Drug or chemical induced diabetes mellitus without complications: Secondary | ICD-10-CM | POA: Diagnosis not present

## 2017-01-30 DIAGNOSIS — I10 Essential (primary) hypertension: Secondary | ICD-10-CM | POA: Diagnosis not present

## 2017-01-30 DIAGNOSIS — N184 Chronic kidney disease, stage 4 (severe): Secondary | ICD-10-CM

## 2017-01-30 NOTE — Patient Instructions (Signed)
Please continue all other medications as before, and refills have been done if requested.  Please have the pharmacy call with any other refills you may need.  Please continue your efforts at being more active, low cholesterol, low purine diet, and weight control.  Please keep your appointments with your specialists as you may have planned  Please return in 6 months, or sooner if needed, with Lab testing done 3-5 days before

## 2017-01-30 NOTE — Progress Notes (Signed)
Subjective:    Patient ID: Tara [name], female    DOB: 11-14-1950, 66 y.o.   MRN: [id number]  HPI  Here to f/u after recent episode gout first MTP left foot now resolved after prednisone per UC  Has hx CKD, stable per pt after seeing Dr Posey Pronto. Has plans to f/uy with pulm for hypersomnolence persisting.  Pt denies chest pain, increased sob or doe, wheezing, orthopnea, PND, palpitations, dizziness or syncope, though has recurring bilat LE mild swelling that is better in the AM, worse in the PM.  Pt denies new neurological symptoms such as new headache, or facial or extremity weakness or numbness   Pt denies polydipsia, polyuria, Wt Readings from Last 3 Encounters:  01/30/17 222 lb (100.7 kg)  01/17/17 220 lb (99.8 kg)  01/14/17 212 lb (96.2 kg)   Past Medical History:  Diagnosis Date  . Anemia   . ANKLE PAIN, LEFT 04/01/2008  . ANXIETY 04/17/2007  . Arthritis   . Chronic kidney disease   . COLONIC POLYPS, HX OF 08/01/2007  . CONTUSIONS, MULTIPLE 04/01/2009  . DEPRESSION 04/17/2007  . DIZZINESS 08/01/2007  . DYSPNEA 08/01/2007   with exertion  . Enlargement of lymph nodes 08/13/2007  . GLUCOSE INTOLERANCE 08/01/2007  . 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 HYPERLIPIDEMIA 08/01/2007  . HYPERSOMNIA 07/28/2008  . HYPERTENSION 04/17/2007  . Impaired glucose tolerance 03/23/2011  . JOINT EFFUSION, LEFT KNEE 06/02/2010  . Loose body in knee 04/01/2009  . Migraines    "stopped 3-4 yr ago" (07/23/2012)  . Morbid obesity ([place]) 04/20/2007  . OTHER DISEASES OF LUNG NOT ELSEWHERE CLASSIFIED 08/01/2007  . Pain in joint, lower leg 04/01/2009  . PERIPHERAL EDEMA 04/21/2009  . Pre-diabetes   . Sarcoidosis 09/25/2007  . SHOULDER PAIN, LEFT 04/01/2009  . Sleep apnea    does not use Cpap   Past Surgical History:  Procedure Laterality Date  . BREAST SURGERY     Biopsy benign  . COMBINED MEDIASTINOSCOPY AND BRONCHOSCOPY  08/2007  . COMBINED MEDIASTINOSCOPY AND BRONCHOSCOPY  2009  . FRACTURE SURGERY  ?02/1997    "left upper arm; put rod in" (07/23/2012)  . GUM SURGERY  2000-?2009   "several ORs; soft tissue graft; took material from roof of mouth" (07/23/2012  . KNEE ARTHROSCOPY  03/2004; 06/2009   "right; left" Dr. Theda Sers  . LYMPH NODE BIOPSY  ~ 2009   "for sarcoidosis; don't know exactly which nodes" (07/23/2102)  . MYOMECTOMY  1994   Open  . REFRACTIVE SURGERY  08/1998   "both eyes" (07/23/2012)  . TOTAL KNEE ARTHROPLASTY Left 09/24/2016   Procedure: TOTAL KNEE ARTHROPLASTY;  Surgeon: Dorna Leitz, MD;  Location: [facility];  Service: Orthopedics;  Laterality: Left;    reports that she has never smoked. She has never used smokeless tobacco. She reports that she drinks alcohol. She reports that she does not use drugs. family history includes Asthma in her sister; Diabetes in her mother; Heart disease (age of onset: 62) in her father; Hypertension in her father, mother, and sister; Stroke in her mother. Allergies  Allergen Reactions  . Fluoxetine Hcl Other (See Comments)    (PROZAC) Suicidal thoughts  . Chocolate Other (See Comments)    SOMETIMES CAUSES SEVERE HEADACHES  . Floxin [Ocuflox] Anxiety    shaky  . Other Nausea And Vomiting    INSTANT ICED TEA PACKETS  . Sulfonamide Derivatives Rash   Current Outpatient Prescriptions on File Prior to Visit  Medication Sig Dispense Refill  .  amphetamine-dextroamphetamine (ADDERALL) 30 MG tablet Take 30 mg by mouth 2 (two) times daily.    Marland Kitchen antiseptic oral rinse (BIOTENE) LIQD 15 mLs by Mouth Rinse route 2 (two) times daily as needed for dry mouth.    Marland Kitchen aspirin EC 81 MG tablet Take 81 mg by mouth daily.    Marland Kitchen atenolol (TENORMIN) 50 MG tablet TAKE 1 TABLET(50 MG) BY MOUTH DAILY (Patient taking differently: Take 50 mg by mouth at bedtime. ) 90 tablet 3  . Carboxymethylcell-Hypromellose (GENTEAL OP) Place 1-2 drops into both eyes 3 (three) times daily as needed (for dry eyes/irritated eyes).    . cloNIDine (CATAPRES) 0.1 MG tablet Take 1 tablet (0.1 mg total) by  mouth 2 (two) times daily. 180 tablet 3  . clorazepate (TRANXENE) 7.5 MG tablet Take 7.5 mg by mouth 3 (three) times daily.    Marland Kitchen desvenlafaxine (PRISTIQ) 100 MG 24 hr tablet Take 100 mg by mouth at bedtime.    Marland Kitchen DILT-XR 240 MG 24 hr capsule TAKE 1 CAPSULE(240 MG) BY MOUTH DAILY 90 capsule 0  . docusate sodium (COLACE) 100 MG capsule Take 1 capsule (100 mg total) by mouth 2 (two) times daily. 30 capsule 0  . doxycycline (VIBRA-TABS) 100 MG tablet Take 1 tablet (100 mg total) by mouth 2 (two) times daily. 20 tablet 0  . ferrous sulfate 325 (65 FE) MG tablet Take 325 mg by mouth daily with breakfast.    . furosemide (LASIX) 40 MG tablet Take 1 tablet (40 mg total) by mouth 2 (two) times daily. (Patient taking differently: Take 40 mg by mouth daily. ) 30 tablet 0  . hydrALAZINE (APRESOLINE) 100 MG tablet Take 1 tablet (100 mg total) by mouth every 8 (eight) hours. (Patient taking differently: Take 100 mg by mouth 3 (three) times daily. )    . latanoprost (XALATAN) 0.005 % ophthalmic solution Place 1 drop into both eyes at bedtime.    . Melatonin 5 MG TABS Take 10 mg by mouth at bedtime.    . Multiple Vitamins-Minerals (MULTIVITAMIN WITH MINERALS) tablet Take 1 tablet by mouth daily.    . Omega 3 1200 MG CAPS Take 2,400 mg by mouth at bedtime.    Marland Kitchen oxyCODONE-acetaminophen (PERCOCET/ROXICET) 5-325 MG tablet Take 1-2 tablets by mouth every 4 (four) hours as needed for severe pain. 60 tablet 0  . predniSONE (DELTASONE) 20 MG tablet Take 2 tablets (40 mg total) by mouth daily with breakfast. 10 tablet 0  . timolol (TIMOPTIC) 0.5 % ophthalmic solution Place 1 drop into both eyes 2 (two) times daily.  0  . traMADol (ULTRAM) 50 MG tablet Take 1 tablet (50 mg total) by mouth every 12 (twelve) hours as needed. 60 tablet 0  . zaleplon (SONATA) 10 MG capsule Take 10 mg by mouth at bedtime as needed for sleep.     No current facility-administered medications on file prior to visit.    Review of Systems   Constitutional: Negative for other unusual diaphoresis or sweats HENT: Negative for ear discharge or swelling Eyes: Negative for other worsening visual disturbances Respiratory: Negative for stridor or other swelling  Gastrointestinal: Negative for worsening distension or other blood Genitourinary: Negative for retention or other urinary change Musculoskeletal: Negative for other MSK pain or swelling Skin: Negative for color change or other new lesions Neurological: Negative for worsening tremors and other numbness  Psychiatric/Behavioral: Negative for worsening agitation or other fatigue All other system neg per pt    Objective:   Physical Exam  BP 134/88   Pulse 81   Ht 5\' 6"  (1.676 m)   Wt 222 lb (100.7 kg)   SpO2 98%   BMI 35.83 kg/m  VS noted, not ill appearing Constitutional: Pt appears in NAD HENT: Head: NCAT.  Right Ear: External ear normal.  Left Ear: External ear normal.  Eyes: . Pupils are equal, round, and reactive to light. Conjunctivae and EOM are normal Nose: without d/c or deformity Neck: Neck supple. Gross normal ROM Cardiovascular: Normal rate and regular rhythm.   Pulmonary/Chest: Effort normal and breath sounds without rales or wheezing.  Neurological: Pt is alert. At baseline orientation, motor grossly intact Skin: Skin is warm. No rashes, other new lesions, has trace to 1+ bilat LE edema, left first MTP is resolved of any increased redness/swelling/tender Psychiatric: Pt behavior is normal without agitation , mild nervous No other exam findings    Assessment & Plan:

## 2017-02-02 NOTE — Assessment & Plan Note (Signed)
stable overall by history and exam, recent data reviewed with pt, and pt to continue medical treatment as before,  to f/u any worsening symptoms or concerns BP Readings from Last 3 Encounters:  01/30/17 134/88  01/17/17 111/70  01/14/17 131/81

## 2017-02-02 NOTE — Assessment & Plan Note (Signed)
stable overall by history and exam, recent data reviewed with pt, and pt to continue medical treatment as before,  to f/u any worsening symptoms or concerns Lab Results  Component Value Date   HGBA1C 5.7 (H) 09/17/2016

## 2017-02-02 NOTE — Assessment & Plan Note (Signed)
C/w venous insufficiency, for compression stockings bilat, leg elevation

## 2017-02-02 NOTE — Assessment & Plan Note (Signed)
stable overall by history and exam, recent data reviewed with pt, and pt to continue medical treatment as before,  to f/u any worsening symptoms or concerns Lab Results  Component Value Date   CREATININE 1.34 (H) 01/07/2017

## 2017-03-05 ENCOUNTER — Encounter: Payer: Self-pay | Admitting: Internal Medicine

## 2017-03-05 ENCOUNTER — Ambulatory Visit (INDEPENDENT_AMBULATORY_CARE_PROVIDER_SITE_OTHER): Payer: BLUE CROSS/BLUE SHIELD | Admitting: Internal Medicine

## 2017-03-05 VITALS — BP 114/68 | HR 58 | Ht 66.0 in | Wt 240.6 lb

## 2017-03-05 DIAGNOSIS — G479 Sleep disorder, unspecified: Secondary | ICD-10-CM | POA: Diagnosis not present

## 2017-03-05 DIAGNOSIS — G4733 Obstructive sleep apnea (adult) (pediatric): Secondary | ICD-10-CM

## 2017-03-05 DIAGNOSIS — G4710 Hypersomnia, unspecified: Secondary | ICD-10-CM | POA: Diagnosis not present

## 2017-03-05 DIAGNOSIS — R002 Palpitations: Secondary | ICD-10-CM

## 2017-03-05 MED ORDER — ZOLPIDEM TARTRATE 5 MG PO TABS: 5.0000 mg | ORAL_TABLET | Freq: Every evening | ORAL | 3 refills | Status: DC | PRN

## 2017-03-05 NOTE — Progress Notes (Signed)
HPI F never smoker followed for sarcoid complicated by hypercalcemia, renal insufficency  PFT 02/07/15-minimal obstructive airways disease, minimal diffusion defect, insignificant response to bronchodilator ACE in July, 2017 remained elevated 74 (8-52) ---------------------------------------------------------------------------  09/05/2016-66 year old female never smoker followed for sarcoid, complicated by hypercalcemia, renal insufficiency, HBP, depression Follows For: Sarcoid, pt reports she is doing well, she is having knee surgery For left knee TKR/Dr. Berenice Primas. Reports she has had no significant respiratory problems this winter with no major respiratory infection. Unchanged dyspnea on exertion if she tries to exercise. Denies fever, adenopathy, night sweats, rash   03/05/17-66 year old female never smoker followed for sarcoid, complicated by hypercalcemia, renal insufficiency, HBP, depression  New Problem-  referred courtesy of Dr Cathlean Cower; Had sleep study 06/25/08; does not wear CPAP.  NPSG  12/ 11/09 - AhI 1.4/hour I desaturation to 89%, body weight 245 lbs Epworth score 14/24 She is widowed but husband had described a bad snoring. We discussed her remote sleep study from 2009. She complains now of inability to sleep a full night, daytime fatigue. Melatonin and Sonata help at bedtime. She continues taking clorazepate 7.5 mg 3 times daily and Adderall 30 mg twice daily. Bedtime around midnight with sleep latency 30-120 minutes. Waking at least once before up at 8 AM. She is physically comfortable in bed and not aware of limb movements or other parasomnias. Most days, in the evening, she is aware of heart pounding and shortness of breath with some lightheadedness. She is trying to cut back on caffeine to one cup daily at most. Naps only on weekends. Denies chest pain with her heart pounding, but chest can feel a little tight. Episodes do not occur during sleep, usually occur at rest or with  minimal activity. Self-limited after a couple of minutes. ENT surgery-none. Epworth score 14/24   ROS- see HPI  + = pos Constitutional:     weight loss, no-night sweats, fevers, chills, + fatigue, lassitude. HEENT:   No-  headaches, difficulty swallowing, tooth/dental problems, sore throat,       No-  sneezing, itching, ear ache, nasal congestion, post nasal drip,  CV:  No- chest pain, no-orthopnea, PND, swelling in lower extremities, anasarca,dizziness, +palpitations  Resp: + shortness of breath with exertion or at rest.              No-   productive cough,  No non-productive cough,  No- coughing up of blood.              No- wheezing.   Skin: No-   rash or lesions. GI:  No-   heartburn, indigestion, abdominal pain, nausea, vomiting,  GU:  MS:  +  joint pain or swelling.   Neuro-     nothing unusual Psych:  No- change in mood or affect. No depression or anxiety.  No memory loss.  OBJ- Physical Exam   General- Alert, Oriented, Affect-appropriate, Distress- none acute, + overweight Skin- rash-none, lesions- none, excoriation- none, cosmetics smeared on face Lymphadenopathy- none Head- atraumatic            Eyes- Gross vision intact, PERRLA, conjunctivae clear, frequent blinking            Ears- Hearing, canals-normal            Nose- Clear, no-Septal dev, mucus, polyps, erosion, perforation             Throat- Mallampati III, mucosa clear , drainage- none, tonsils- atrophic Neck- flexible , trachea midline, no stridor , thyroid nl,  carotid no bruit Chest - symmetrical excursion , unlabored           Heart/CV- RRR + extra beats , no murmur , no gallop  , no rub, nl s1 s2                           - JVD- none , + elastic hose, stasis changes- none, varices- none           Lung- clear to P&A, wheeze- none, cough- none , dullness-none, rub- none           Chest wall-  Abd-  Br/ Gen/ Rectal- Not done, not indicated Extrem- cyanosis- none, clubbing, none, atrophy- none, strength-  nl Neuro- +blinking, grimacing, ? Tics or frontal lobe ?

## 2017-03-05 NOTE — Patient Instructions (Signed)
Order- schedule unattended home sleep test   Dx OSA, hypersomnolence  Script printed - try ambien at bedtime for sleep, instead of sonata. See if it is easier for you to sleep throughthe night and to feel a little better rested the next day. Ok to take an occasional nap if needed.

## 2017-03-12 DIAGNOSIS — G4733 Obstructive sleep apnea (adult) (pediatric): Secondary | ICD-10-CM | POA: Insufficient documentation

## 2017-03-12 DIAGNOSIS — R002 Palpitations: Secondary | ICD-10-CM | POA: Insufficient documentation

## 2017-03-12 NOTE — Assessment & Plan Note (Addendum)
She is describing episodes of heart pounding and shortness of breath late in the day, with some lightheadedness. Need to consider whether her second dose of Adderall is causing this, or she is developing paroxysmal atrial fibrillation. Plan-I recommended she ask her PCP about cardiology evaluation for possible paroxysmal atrial fibrillation. She should not take her second Adderall dose later than 1 or 2 PM. That dose may need to be reduced.

## 2017-03-12 NOTE — Assessment & Plan Note (Signed)
She is describing difficulty maintaining sleep despite clorazepate and Sonata with melatonin. She has a history of loud snoring. A remote sleep study was negative for sleep apnea but it is appropriate to recheck. I don't think she is describing parasomnias such as limb movement. Question role of Adderall. Plan-schedule sleep study. Meanwhile try a sleep medicine with longer half-life-replace Sonata with Ambien 5 mg as discussed.

## 2017-03-13 ENCOUNTER — Other Ambulatory Visit: Payer: Self-pay | Admitting: *Deleted

## 2017-03-13 DIAGNOSIS — G4710 Hypersomnia, unspecified: Secondary | ICD-10-CM

## 2017-03-13 DIAGNOSIS — G4733 Obstructive sleep apnea (adult) (pediatric): Secondary | ICD-10-CM | POA: Diagnosis not present

## 2017-03-14 DIAGNOSIS — H25811 Combined forms of age-related cataract, right eye: Secondary | ICD-10-CM | POA: Diagnosis not present

## 2017-03-14 DIAGNOSIS — H2511 Age-related nuclear cataract, right eye: Secondary | ICD-10-CM | POA: Diagnosis not present

## 2017-03-14 DIAGNOSIS — H25011 Cortical age-related cataract, right eye: Secondary | ICD-10-CM | POA: Diagnosis not present

## 2017-03-24 ENCOUNTER — Other Ambulatory Visit: Payer: Self-pay | Admitting: Internal Medicine

## 2017-03-25 ENCOUNTER — Encounter: Payer: Self-pay | Admitting: Internal Medicine

## 2017-03-25 DIAGNOSIS — G4733 Obstructive sleep apnea (adult) (pediatric): Secondary | ICD-10-CM

## 2017-03-25 NOTE — Telephone Encounter (Signed)
Pt send an email in asking about the result of her Home sleep test at the end of august.  CY please advise. Thanks

## 2017-03-25 NOTE — Addendum Note (Signed)
Addended by: Desmond Dike C on: 03/25/2017 04:25 PM   Modules accepted: Orders

## 2017-03-25 NOTE — Telephone Encounter (Signed)
Her home sleep test showed moderately severe obstructive sleep apnea, with an average of 27 apneas per hour and drops in her blood oxygen saturation. Scores in this range indicate she should be treated.  I recommend order new DME, new CPAP auto 5-20, mask of choice, humidifier, supplies, AirView  Please make sure she has a follow-up appointment with me in 31- 90 days per isnurance regulations.

## 2017-03-26 ENCOUNTER — Ambulatory Visit: Payer: Self-pay | Admitting: Internal Medicine

## 2017-03-27 DIAGNOSIS — F900 Attention-deficit hyperactivity disorder, predominantly inattentive type: Secondary | ICD-10-CM | POA: Diagnosis not present

## 2017-03-27 DIAGNOSIS — F3342 Major depressive disorder, recurrent, in full remission: Secondary | ICD-10-CM | POA: Diagnosis not present

## 2017-03-27 DIAGNOSIS — F411 Generalized anxiety disorder: Secondary | ICD-10-CM | POA: Diagnosis not present

## 2017-04-15 ENCOUNTER — Encounter: Payer: Self-pay | Admitting: Internal Medicine

## 2017-04-15 MED ORDER — ATENOLOL 50 MG PO TABS: ORAL_TABLET | ORAL | 1 refills | Status: DC

## 2017-05-03 DIAGNOSIS — N183 Chronic kidney disease, stage 3 (moderate): Secondary | ICD-10-CM | POA: Diagnosis not present

## 2017-05-03 DIAGNOSIS — N189 Chronic kidney disease, unspecified: Secondary | ICD-10-CM | POA: Diagnosis not present

## 2017-05-08 DIAGNOSIS — N183 Chronic kidney disease, stage 3 (moderate): Secondary | ICD-10-CM | POA: Diagnosis not present

## 2017-05-08 DIAGNOSIS — I129 Hypertensive chronic kidney disease with stage 1 through stage 4 chronic kidney disease, or unspecified chronic kidney disease: Secondary | ICD-10-CM | POA: Diagnosis not present

## 2017-05-08 DIAGNOSIS — D631 Anemia in chronic kidney disease: Secondary | ICD-10-CM | POA: Diagnosis not present

## 2017-05-10 ENCOUNTER — Encounter: Payer: Self-pay | Admitting: Gynecology

## 2017-05-10 ENCOUNTER — Ambulatory Visit (INDEPENDENT_AMBULATORY_CARE_PROVIDER_SITE_OTHER): Payer: BLUE CROSS/BLUE SHIELD | Admitting: Gynecology

## 2017-05-10 VITALS — BP 122/78 | Ht 66.0 in | Wt 226.0 lb

## 2017-05-10 DIAGNOSIS — N952 Postmenopausal atrophic vaginitis: Secondary | ICD-10-CM | POA: Diagnosis not present

## 2017-05-10 DIAGNOSIS — Z01411 Encounter for gynecological examination (general) (routine) with abnormal findings: Secondary | ICD-10-CM | POA: Diagnosis not present

## 2017-05-10 DIAGNOSIS — Z1151 Encounter for screening for human papillomavirus (HPV): Secondary | ICD-10-CM | POA: Diagnosis not present

## 2017-05-10 NOTE — Patient Instructions (Signed)
Follow-up in 1 year for annual gynecologic exam, sooner if any problems

## 2017-05-10 NOTE — Progress Notes (Signed)
    Kellan Boehlke 08/02/1950 [id number]        66 y.o.  G0P0 for annual gynecologic exam.  When well from a gynecologic standpoint.  Past medical history,surgical history, problem list, medications, allergies, family history and social history were all reviewed and documented as reviewed in the EPIC chart.  ROS:  Performed with pertinent positives and negatives included in the history, assessment and plan.   Additional significant findings : None   Exam: [name] assistant Vitals:   05/10/17 1622  BP: 122/78  Weight: 226 lb (102.5 kg)  Height: 5\' 6"  (1.676 m)   Body mass index is 36.48 kg/m.  General appearance:  Normal affect, orientation and appearance. Skin: Grossly normal HEENT: Without gross lesions.  No cervical or supraclavicular adenopathy. Thyroid normal.  Lungs:  Clear without wheezing, rales or rhonchi Cardiac: RR, without RMG Abdominal:  Soft, nontender, without masses, guarding, rebound, organomegaly or hernia Breasts:  Examined lying and sitting without masses, retractions, discharge or axillary adenopathy. Pelvic:  Ext, BUS, Vagina: With atrophic changes  Cervix: High in the vaginal vault difficult to visualize.  No palpable abnormalities.  Pap smear/HPV  Uterus: Difficult to palpate but no gross masses or tenderness  Adnexa: Without masses or tenderness    Anus and perineum: Normal   Rectovaginal: Normal sphincter tone without palpated masses or tenderness.    Assessment/Plan:  66 y.o. G0P0 female for annual gynecologic exam.   1. Postmenopausal/atrophic genital changes.  No significant hot flushes, night sweats, vaginal dryness or any vaginal bleeding.  Continue to monitor and report any issues or bleeding. 2. Mammography 04/2016.  Patient due for mammogram now and knows to call and schedule.  Breast exam normal today.  SBE monthly reviewed. 3. Colonoscopy 2014.  Repeat at their recommended interval. 4. Pap smear/HPV 09/2012.  Pap smear/HPV done today.   No history of abnormal Pap smears previously. 5. DEXA 2014 normal.  Plan repeat DEXA next year at 5-year interval at age 66.  6. Health maintenance.  No routine lab work done as patient reports this done elsewhere.  Follow-up 1 year, sooner as needed.   Anastasio Auerbach MD, 4:50 PM 05/10/2017

## 2017-05-10 NOTE — Addendum Note (Signed)
Addended by: Nelva Nay on: 05/10/2017 04:55 PM   Modules accepted: Orders

## 2017-05-13 DIAGNOSIS — G4733 Obstructive sleep apnea (adult) (pediatric): Secondary | ICD-10-CM | POA: Diagnosis not present

## 2017-05-14 LAB — PAP IG AND HPV HIGH-RISK: HPV DNA High Risk: NOT DETECTED

## 2017-06-13 ENCOUNTER — Other Ambulatory Visit (INDEPENDENT_AMBULATORY_CARE_PROVIDER_SITE_OTHER): Payer: BLUE CROSS/BLUE SHIELD

## 2017-06-13 ENCOUNTER — Encounter: Payer: Self-pay | Admitting: Internal Medicine

## 2017-06-13 ENCOUNTER — Ambulatory Visit (INDEPENDENT_AMBULATORY_CARE_PROVIDER_SITE_OTHER): Payer: BLUE CROSS/BLUE SHIELD | Admitting: Internal Medicine

## 2017-06-13 VITALS — BP 118/66 | HR 55 | Ht 66.0 in | Wt 226.4 lb

## 2017-06-13 DIAGNOSIS — G9341 Metabolic encephalopathy: Secondary | ICD-10-CM

## 2017-06-13 DIAGNOSIS — E8881 Metabolic syndrome: Secondary | ICD-10-CM

## 2017-06-13 DIAGNOSIS — Z23 Encounter for immunization: Secondary | ICD-10-CM | POA: Diagnosis not present

## 2017-06-13 DIAGNOSIS — G4733 Obstructive sleep apnea (adult) (pediatric): Secondary | ICD-10-CM

## 2017-06-13 LAB — CBC WITH DIFFERENTIAL/PLATELET
BASOS ABS: 0.1 10*3/uL (ref 0.0–0.1)
BASOS PCT: 0.9 % (ref 0.0–3.0)
EOS ABS: 0 10*3/uL (ref 0.0–0.7)
Eosinophils Relative: 0.1 % (ref 0.0–5.0)
HEMATOCRIT: 41.6 % (ref 36.0–46.0)
Hemoglobin: 13.6 g/dL (ref 12.0–15.0)
LYMPHS ABS: 1.3 10*3/uL (ref 0.7–4.0)
Lymphocytes Relative: 20.6 % (ref 12.0–46.0)
MCHC: 32.8 g/dL (ref 30.0–36.0)
MCV: 82.2 fl (ref 78.0–100.0)
MONOS PCT: 10.2 % (ref 3.0–12.0)
Monocytes Absolute: 0.7 10*3/uL (ref 0.1–1.0)
NEUTROS ABS: 4.4 10*3/uL (ref 1.4–7.7)
NEUTROS PCT: 68.2 % (ref 43.0–77.0)
PLATELETS: 221 10*3/uL (ref 150.0–400.0)
RBC: 5.06 Mil/uL (ref 3.87–5.11)
RDW: 15.5 % (ref 11.5–15.5)
WBC: 6.4 10*3/uL (ref 4.0–10.5)

## 2017-06-13 NOTE — Progress Notes (Signed)
HPI F never smoker followed for sarcoid, OSA complicated by hypercalcemia, renal insufficency, glaucoma PFT 02/07/15-minimal obstructive airways disease, minimal diffusion defect, insignificant response to bronchodilator ACE in July, 2017 remained elevated 74 (8-52) NPSG  12/ 11/09 - AhI 1.4/hour I desaturation to 89%, body weight 245 lbs Unattended Home Sleep Test 03/12/17-AHI 27.6/hour, desaturation to 75%, body weight 220 pounds --------------------------------------------------------------------------   03/05/17-66 year old female never smoker followed for sarcoid, complicated by hypercalcemia, renal insufficiency, HBP, depression  New Problem-  referred courtesy of Dr Cathlean Cower; Had sleep study 06/25/08; does not wear CPAP.  NPSG  12/ 11/09 - AhI 1.4/hour I desaturation to 89%, body weight 245 lbs Epworth score 14/24 She is widowed but husband had described a bad snoring. We discussed her remote sleep study from 2009. She complains now of inability to sleep a full night, daytime fatigue. Melatonin and Sonata help at bedtime. She continues taking clorazepate 7.5 mg 3 times daily and Adderall 30 mg twice daily. Bedtime around midnight with sleep latency 30-120 minutes. Waking at least once before up at 8 AM. She is physically comfortable in bed and not aware of limb movements or other parasomnias. Most days, in the evening, she is aware of heart pounding and shortness of breath with some lightheadedness. She is trying to cut back on caffeine to one cup daily at most. Naps only on weekends. Denies chest pain with her heart pounding, but chest can feel a little tight. Episodes do not occur during sleep, usually occur at rest or with minimal activity. Self-limited after a couple of minutes. ENT surgery-none. Epworth score 14/24  06/13/17- 66 year old female never smoker followed for Sarcoid, OSA complicated by hypercalcemia, renal insufficiency, HBP, depression, glaucoma  Unattended Home Sleep Test  03/12/17-AHI 27.6/hour, desaturation to 75%, body weight 220 pounds CPAP auto 5-20/[facility]> today to 10-20 OSA: DME [facility].Pt tries to use CPAP as much as possible.  Has trouble getting comfortable with CPAP and says the machine was not "plugging in right" for a while.  Airflow dries her eyes. Download 67% compliance, AHI 6/hour Asks we check some labs to follow-up after hospitalization this year for metabolic syndrome complications.  Occasionally feels she is laboring to breathe but admits deconditioned.  Trying to walk more since knee surgery.  ROS- see HPI  + = pos Constitutional:     weight loss, no-night sweats, fevers, chills, + fatigue, lassitude. HEENT:   No-  headaches, difficulty swallowing, tooth/dental problems, sore throat,       No-  sneezing, itching, ear ache, nasal congestion, post nasal drip,  CV:  No- chest pain, no-orthopnea, PND, swelling in lower extremities, anasarca,dizziness, +palpitations  Resp: + shortness of breath with exertion or at rest.              No-   productive cough,  No non-productive cough,  No- coughing up of blood.              No- wheezing.   Skin: No-   rash or lesions. GI:  No-   heartburn, indigestion, abdominal pain, nausea, vomiting,  GU:  MS:  +  joint pain or swelling.   Neuro-     nothing unusual Psych:  No- change in mood or affect. No depression or anxiety.  No memory loss.  OBJ- Physical Exam   General- Alert, Oriented, Affect-appropriate, Distress- none acute, + overweight Skin- rash-none, lesions- none, excoriation- none, cosmetics smeared on face Lymphadenopathy- none Head- atraumatic  Eyes- Gross vision intact, PERRLA, conjunctivae clear, frequent blinking            Ears- Hearing, canals-normal            Nose- Clear, no-Septal dev, mucus, polyps, erosion, perforation             Throat- Mallampati III, mucosa clear , drainage- none, tonsils- atrophic Neck- flexible , trachea midline, no stridor , thyroid nl, carotid  no bruit Chest - symmetrical excursion , unlabored           Heart/CV- RRR + extra beats , no murmur , no gallop  , no rub, nl s1 s2                           - JVD- none , + elastic hose, stasis changes- none, varices- none           Lung- clear to P&A, wheeze- none, cough- none , dullness-none, rub- none           Chest wall-  Abd-  Br/ Gen/ Rectal- Not done, not indicated Extrem- cyanosis- none, clubbing, none, atrophy- none, strength- nl Neuro- +blinking, grimacing, ? Tics or frontal lobe ?

## 2017-06-13 NOTE — Patient Instructions (Addendum)
Order- DME [facility]   Please change autopap to 10-20, continue mask of choice, humidifier, supplies, AirView   Dx OSA  Our goal with CPAP is to use it at least 4 hours per night for 70% of nights. Ideally we would like to be able to use CPAP all night, every night, and anytime you sleep.   Lab- CBC w diff, CMP14 with EGFR    Dx metabolic syndrome  Please call as needed

## 2017-06-14 NOTE — Assessment & Plan Note (Signed)
Obstructive sleep apnea confirmed and she has started CPAP but needs help with comfort.  She seems to be benefiting when she can wear it and should continue.  We are going to raise lower limit of AutoPap for comfort.  Discussed use of Lacri-Lube oint

## 2017-06-14 NOTE — Assessment & Plan Note (Signed)
To repeat basic labs she had at hospitalization for recheck

## 2017-06-17 DIAGNOSIS — M25562 Pain in left knee: Secondary | ICD-10-CM | POA: Diagnosis not present

## 2017-06-17 DIAGNOSIS — M1711 Unilateral primary osteoarthritis, right knee: Secondary | ICD-10-CM | POA: Diagnosis not present

## 2017-06-17 DIAGNOSIS — Z96652 Presence of left artificial knee joint: Secondary | ICD-10-CM | POA: Diagnosis not present

## 2017-06-18 NOTE — Progress Notes (Signed)
Pt was called today regarding results, and asked to schedule follow up visit with CY for 3 months. Scheduled ROV today in March 2019 with CY.

## 2017-07-12 DIAGNOSIS — H0100A Unspecified blepharitis right eye, upper and lower eyelids: Secondary | ICD-10-CM | POA: Diagnosis not present

## 2017-07-12 DIAGNOSIS — H04123 Dry eye syndrome of bilateral lacrimal glands: Secondary | ICD-10-CM | POA: Diagnosis not present

## 2017-07-12 DIAGNOSIS — H0100B Unspecified blepharitis left eye, upper and lower eyelids: Secondary | ICD-10-CM | POA: Diagnosis not present

## 2017-07-13 DIAGNOSIS — G4733 Obstructive sleep apnea (adult) (pediatric): Secondary | ICD-10-CM | POA: Diagnosis not present

## 2017-08-01 ENCOUNTER — Ambulatory Visit: Payer: Self-pay | Admitting: Family Medicine

## 2017-08-12 DIAGNOSIS — H04123 Dry eye syndrome of bilateral lacrimal glands: Secondary | ICD-10-CM | POA: Diagnosis not present

## 2017-08-12 DIAGNOSIS — H401122 Primary open-angle glaucoma, left eye, moderate stage: Secondary | ICD-10-CM | POA: Diagnosis not present

## 2017-08-12 DIAGNOSIS — H401113 Primary open-angle glaucoma, right eye, severe stage: Secondary | ICD-10-CM | POA: Diagnosis not present

## 2017-08-13 DIAGNOSIS — G4733 Obstructive sleep apnea (adult) (pediatric): Secondary | ICD-10-CM | POA: Diagnosis not present

## 2017-08-14 NOTE — Progress Notes (Signed)
[name] [address] [place], [place] [address] Phone: [phone] Subjective:      CC: Foot pain  [id number]  Mickie Badders is a 67 y.o. female coming in with complaint of left foot pain. She has swelling bilaterally.  Initially her foot was hurting in her big toe. Not much pain. She's been wearing a compression sock. Has a prescription for higher compression socks. She noticed her left foot swelling due to stress.   Onset- June Patient has had this pain before.  Has been in custom orthotics.  Feels like to have broken down at this point. Aggravating factors- Walking Reliving factors-  Therapies tried-not any compression stockings at this time. Severity-5 out of 10     Past Medical History:  Diagnosis Date  . Anemia   . ANKLE PAIN, LEFT 04/01/2008  . ANXIETY 04/17/2007  . Arthritis   . Chronic kidney disease   . COLONIC POLYPS, HX OF 08/01/2007  . CONTUSIONS, MULTIPLE 04/01/2009  . DEPRESSION 04/17/2007  . DIZZINESS 08/01/2007  . DYSPNEA 08/01/2007   with exertion  . Enlargement of lymph nodes 08/13/2007  . GLUCOSE INTOLERANCE 08/01/2007  . 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 HYPERLIPIDEMIA 08/01/2007  . HYPERSOMNIA 07/28/2008  . HYPERTENSION 04/17/2007  . Impaired glucose tolerance 03/23/2011  . JOINT EFFUSION, LEFT KNEE 06/02/2010  . Loose body in knee 04/01/2009  . Migraines    "stopped 3-4 yr ago" (07/23/2012)  . Morbid obesity ([place]) 04/20/2007  . OTHER DISEASES OF LUNG NOT ELSEWHERE CLASSIFIED 08/01/2007  . Pain in joint, lower leg 04/01/2009  . PERIPHERAL EDEMA 04/21/2009  . Pre-diabetes   . Sarcoidosis 09/25/2007  . SHOULDER PAIN, LEFT 04/01/2009  . Sleep apnea    does not use Cpap   Past Surgical History:  Procedure Laterality Date  . BREAST SURGERY     Biopsy benign  . CATARACT EXTRACTION    . COMBINED MEDIASTINOSCOPY AND BRONCHOSCOPY  08/2007  . COMBINED MEDIASTINOSCOPY AND BRONCHOSCOPY  2009  . Dental implant    . FRACTURE  SURGERY  ?02/1997   "left upper arm; put rod in" (07/23/2012)  . GUM SURGERY  2000-?2009   "several ORs; soft tissue graft; took material from roof of mouth" (07/23/2012  . KNEE ARTHROSCOPY  03/2004; 06/2009   "right; left" Dr. Theda Sers  . LYMPH NODE BIOPSY  ~ 2009   "for sarcoidosis; don't know exactly which nodes" (07/23/2102)  . MYOMECTOMY  1994   Open  . REFRACTIVE SURGERY  08/1998   "both eyes" (07/23/2012)  . TOTAL KNEE ARTHROPLASTY Left 09/24/2016   Procedure: TOTAL KNEE ARTHROPLASTY;  Surgeon: Dorna Leitz, MD;  Location: [facility];  Service: Orthopedics;  Laterality: Left;   Social History   Socioeconomic History  . Marital status: Widowed    Spouse name: None  . Number of children: None  . Years of education: None  . Highest education level: None  Social Needs  . Financial resource strain: None  . Food insecurity - worry: None  . Food insecurity - inability: None  . Transportation needs - medical: None  . Transportation needs - non-medical: None  Occupational History  . Occupation: Designer, jewellery  Tobacco Use  . Smoking status: Never Smoker  . Smokeless tobacco: Never Used  Substance and Sexual Activity  . Alcohol use: Yes    Comment: Occas  . Drug use: No  . Sexual activity: Not Currently    Birth control/protection: Post-menopausal    Comment: 1st intercourse  12 yo-1 partner  Other Topics Concern  . None  Social History Narrative   Lives with 4 cats   Allergies  Allergen Reactions  . Fluoxetine Hcl Other (See Comments)    (PROZAC) Suicidal thoughts  . Chocolate Other (See Comments)    SOMETIMES CAUSES SEVERE HEADACHES  . Floxin [Ocuflox] Anxiety    shaky  . Other Nausea And Vomiting    INSTANT ICED TEA PACKETS  . Sulfonamide Derivatives Rash   Family History  Problem Relation Age of Onset  . Diabetes Mother   . Hypertension Mother   . Stroke Mother   . Hypertension Sister   . Asthma Sister   . Heart disease Father 27  . Hypertension Father   . Colon  cancer Neg Hx      Past medical history, social, surgical and family history all reviewed in electronic medical record.  No pertanent information unless stated regarding to the chief complaint.   Review of Systems:Review of systems updated and as accurate as of 08/15/17  No headache, visual changes, nausea, vomiting, diarrhea, constipation, dizziness, abdominal pain, skin rash, fevers, chills, night sweats, weight loss, swollen lymph nodes, body aches, joint swelling, muscle aches, chest pain, shortness of breath, mood changes.   Objective  Blood pressure 124/70, pulse (!) 56, height 5\' 6"  (1.676 m), weight 231 lb (104.8 kg), SpO2 97 %. Systems examined below as of 08/15/17   General: No apparent distress alert and oriented x3 mood and affect normal, dressed appropriately.  HEENT: Pupils equal, extraocular movements intact  Respiratory: Patient's speak in full sentences and does not appear short of breath  Cardiovascular: 1+ lower extremity edema, non tender, no erythema  Skin: Warm dry intact with no signs of infection or rash on extremities or on axial skeleton.  Abdomen: Soft nontender  Neuro: Cranial nerves II through XII are intact, neurovascularly intact in all extremities with 2+ DTRs and 2+ pulses.  Lymph: No lymphadenopathy of posterior or anterior cervical chain or axillae bilaterally.  Gait normal with good balance and coordination.  MSK:  Non tender with full range of motion and good stability and symmetric strength and tone of shoulders, elbows, wrist, hip, and ankles bilaterally.  Mild arthritic changes of multiple joints Knee: valgus deformity noted. Large thigh to calf ratio.  Tender to palpation over medial and PF joint line.  ROM full in flexion and extension and lower leg rotation. instability with valgus force.  painful patellar compression. Patellar glide with moderate crepitus. Patellar and quadriceps tendons unremarkable. Hamstring and quadriceps strength is  normal. Contralateral knee shows  Foot exam shows inversion noted of the foot bilateral Transverse arch with space between the first and second toes.  +1 dependent edema    Impression and Recommendations:     This case required medical decision making of moderate complexity.      Note: This dictation was prepared with Dragon dictation along with smaller phrase technology. Any transcriptional errors that result from this process are unintentional.

## 2017-08-15 ENCOUNTER — Encounter: Payer: Self-pay | Admitting: Family Medicine

## 2017-08-15 ENCOUNTER — Telehealth: Payer: Self-pay | Admitting: Internal Medicine

## 2017-08-15 ENCOUNTER — Ambulatory Visit (INDEPENDENT_AMBULATORY_CARE_PROVIDER_SITE_OTHER): Payer: BLUE CROSS/BLUE SHIELD | Admitting: Family Medicine

## 2017-08-15 DIAGNOSIS — M216X1 Other acquired deformities of right foot: Secondary | ICD-10-CM

## 2017-08-15 MED ORDER — ALLOPURINOL 100 MG PO TABS: 100.0000 mg | ORAL_TABLET | Freq: Every day | ORAL | 6 refills | Status: DC

## 2017-08-15 NOTE — Telephone Encounter (Signed)
Per Dr. [name] patient needs 20-30. Patient has been informed and expressed understanding.

## 2017-08-15 NOTE — Telephone Encounter (Signed)
Copied from [place] [id number]. Topic: Quick Communication - See Telephone Encounter >> Aug 15, 2017  9:39 AM Synthia Innocent wrote: CRM for notification. See Telephone encounter for:  Patient is at [facility], trying to get her compression stockings, they are needing to know the compression level 20-30 or 30-40. Please advise 08/15/17.

## 2017-08-15 NOTE — Assessment & Plan Note (Signed)
Discussed with patient.  Arthritic changes of multiple joints.  Patient did well with custom orthotics but I feel that more aggressive insoles would be beneficial.  Will be referred for a thicker EVA foam.

## 2017-08-15 NOTE — Patient Instructions (Addendum)
Good to see you  Compression stockings would be good allopurinol 100mg  daily for gout Still consider vitmain D 1000 IU 3 times a week.  Clearance Coots for orthotics at [facility] [employer].  See me again in 4 weeks

## 2017-08-19 ENCOUNTER — Ambulatory Visit (INDEPENDENT_AMBULATORY_CARE_PROVIDER_SITE_OTHER): Payer: BLUE CROSS/BLUE SHIELD | Admitting: Sports Medicine

## 2017-08-19 ENCOUNTER — Encounter: Payer: Self-pay | Admitting: Sports Medicine

## 2017-08-19 DIAGNOSIS — R269 Unspecified abnormalities of gait and mobility: Secondary | ICD-10-CM

## 2017-08-19 DIAGNOSIS — M216X1 Other acquired deformities of right foot: Secondary | ICD-10-CM

## 2017-08-19 DIAGNOSIS — M7671 Peroneal tendinitis, right leg: Secondary | ICD-10-CM | POA: Diagnosis not present

## 2017-08-19 DIAGNOSIS — M19079 Primary osteoarthritis, unspecified ankle and foot: Secondary | ICD-10-CM

## 2017-08-19 NOTE — Progress Notes (Signed)
Tara [name]. [name] at [facility] [phone]  Tara [name] - 67 y.o. female MRN [id number]  Date of birth: 02-09-1951  Visit Date: 08/19/2017  PCP: Corwin Levins, MD   Referred by: Corwin Levins, MD   Scribe for today's visit: Stevenson Clinch, CMA     SUBJECTIVE:  Tara [name] is here for No chief complaint on file.  Her LT foot pain symptoms INITIALLY: Began to flare up again this past June. Described as moderate swelling, radiating to KT knee Worsened with walking Improved with orthotics, compression socks.  Additional associated symptoms include: She c/o bilateral swelling, pain in the big toe. She also c/o pain in RT ankle.     At this time symptoms show no change compared to onset  She has been wearing compression socks. She has tried custom orthotics in the past, feels that they have broken down now. She has been taking medication for gout with some relief.      ROS Denies night time disturbances. Denies fevers, chills, or night sweats. Denies unexplained weight loss. Denies personal history of cancer. Denies changes in bowel or bladder habits. Denies recent unreported falls. Reports new or worsening dyspnea or wheezing. Reports headaches or dizziness.  Reports numbness, tingling or weakness in RT arm.  Denies dizziness or presyncopal episodes Denies lower extremity edema     HISTORY & PERTINENT PRIOR DATA:  Prior History reviewed and updated per electronic medical record.  Significant history, findings, studies and interim changes include:  reports that  has never smoked. she has never used smokeless tobacco. Recent Labs    09/17/16 1427 01/14/17 1551  HGBA1C 5.7*  --   LABURIC  --  7.6*   No specialty comments available. No problems updated.  OBJECTIVE:  VS:  HT:    WT:   BMI:     BP:   HR: bpm  TEMP: ( )  RESP:    Marked pronation of the bilateral feet with marked high cavus rigid ankles  and mid feet.  ASSESSMENT & PLAN:   1. Peroneal tendonitis of right lower extremity   2. Pronation of right foot   3. Ankle arthritis   4. Abnormal gait    PLAN:  Custom orthotics today    ++++++++++++++++++++++++++++++++++++++++++++ Follow-up: No Follow-up on file.   Pertinent documentation may be included in additional procedure notes, imaging studies, problem based documentation and patient instructions. Please see these sections of the encounter for additional information regarding this visit. CMA/ATC served as Neurosurgeon during this visit. History, Physical, and Plan performed by medical provider. Documentation and orders reviewed and attested to.      Andrena Mews, DO    [facility]

## 2017-08-19 NOTE — Procedures (Signed)
PROCEDURE: CUSTOM ORTHOTIC FABRICATION Patient's underlying musculoskeletal conditions are directly related to poor biomechanics and will benefit from a functional custom orthotic.  There are no significant foot deformities that complicate the use of a custom orthotic.  The patient was fitted for a standard, cushioned, semi-rigid orthotic. The orthotic was heated & placed on the orthotic stand. The patient was positioned in subtalar neutral position and 10 of ankle dorsiflexion and weight bearing stance on the heated orthotic blank. After completion of the molding a base was applied to the orthotic blank. The orthotic was ground to a stable position for weightbearing. The patient ambulated in these and reported they were comfortable without pressure spots.              BLANK:  Size 8 - Standard Cushioned                 BASE:  Blue EVA      POSTINGS:  n/a

## 2017-09-11 NOTE — Progress Notes (Signed)
[name] [address] [place], [place] [address] Phone: [phone] Subjective:     CC: foot pain follow up   [id number]  Tara [name] is a 67 y.o. female coming in with complaint of foot pain bilaterally, seems to be having more swelling, seems to be more on the medial aspect of the ankles.  Has had more of a peroneal tendinitis.  Put in custom orthotics.  Continues to try to do walking to lose weight.  Patient states that the swelling is somewhat severe in the system discomfort.  Wondering what she can possibly do.  Doing home exercises but feels like she needs more guidance.    Past Medical History:  Diagnosis Date  . Anemia   . ANKLE PAIN, LEFT 04/01/2008  . ANXIETY 04/17/2007  . Arthritis   . Chronic kidney disease   . COLONIC POLYPS, HX OF 08/01/2007  . CONTUSIONS, MULTIPLE 04/01/2009  . DEPRESSION 04/17/2007  . DIZZINESS 08/01/2007  . DYSPNEA 08/01/2007   with exertion  . Enlargement of lymph nodes 08/13/2007  . GLUCOSE INTOLERANCE 08/01/2007  . 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 HYPERLIPIDEMIA 08/01/2007  . HYPERSOMNIA 07/28/2008  . HYPERTENSION 04/17/2007  . Impaired glucose tolerance 03/23/2011  . JOINT EFFUSION, LEFT KNEE 06/02/2010  . Loose body in knee 04/01/2009  . Migraines    "stopped 3-4 yr ago" (07/23/2012)  . Morbid obesity ([place]) 04/20/2007  . OTHER DISEASES OF LUNG NOT ELSEWHERE CLASSIFIED 08/01/2007  . Pain in joint, lower leg 04/01/2009  . PERIPHERAL EDEMA 04/21/2009  . Pre-diabetes   . Sarcoidosis 09/25/2007  . SHOULDER PAIN, LEFT 04/01/2009  . Sleep apnea    does not use Cpap   Past Surgical History:  Procedure Laterality Date  . BREAST SURGERY     Biopsy benign  . CATARACT EXTRACTION    . COMBINED MEDIASTINOSCOPY AND BRONCHOSCOPY  08/2007  . COMBINED MEDIASTINOSCOPY AND BRONCHOSCOPY  2009  . Dental implant    . FRACTURE SURGERY  ?02/1997   "left upper arm; put rod in" (07/23/2012)  . GUM SURGERY  2000-?2009   "several  ORs; soft tissue graft; took material from roof of mouth" (07/23/2012  . KNEE ARTHROSCOPY  03/2004; 06/2009   "right; left" Dr. Theda Sers  . LYMPH NODE BIOPSY  ~ 2009   "for sarcoidosis; don't know exactly which nodes" (07/23/2102)  . MYOMECTOMY  1994   Open  . REFRACTIVE SURGERY  08/1998   "both eyes" (07/23/2012)  . TOTAL KNEE ARTHROPLASTY Left 09/24/2016   Procedure: TOTAL KNEE ARTHROPLASTY;  Surgeon: Dorna Leitz, MD;  Location: [facility];  Service: Orthopedics;  Laterality: Left;   Social History   Socioeconomic History  . Marital status: Widowed    Spouse name: None  . Number of children: None  . Years of education: None  . Highest education level: None  Social Needs  . Financial resource strain: None  . Food insecurity - worry: None  . Food insecurity - inability: None  . Transportation needs - medical: None  . Transportation needs - non-medical: None  Occupational History  . Occupation: Designer, jewellery  Tobacco Use  . Smoking status: Never Smoker  . Smokeless tobacco: Never Used  Substance and Sexual Activity  . Alcohol use: Yes    Comment: Occas  . Drug use: No  . Sexual activity: Not Currently    Birth control/protection: Post-menopausal    Comment: 1st intercourse 5 yo-1 partner  Other Topics Concern  . None  Social History Narrative   Lives with 4 cats   Allergies  Allergen Reactions  . Fluoxetine Hcl Other (See Comments)    (PROZAC) Suicidal thoughts  . Chocolate Other (See Comments)    SOMETIMES CAUSES SEVERE HEADACHES  . Floxin [Ocuflox] Anxiety    shaky  . Other Nausea And Vomiting    INSTANT ICED TEA PACKETS  . Sulfonamide Derivatives Rash   Family History  Problem Relation Age of Onset  . Diabetes Mother   . Hypertension Mother   . Stroke Mother   . Hypertension Sister   . Asthma Sister   . Heart disease Father 22  . Hypertension Father   . Colon cancer Neg Hx      Past medical history, social, surgical and family history all reviewed in  electronic medical record.  No pertanent information unless stated regarding to the chief complaint.   Review of Systems:Review of systems updated and as accurate as of 09/12/17  No headache, visual changes, nausea, vomiting, diarrhea, constipation, dizziness, abdominal pain, skin rash, fevers, chills, night sweats, weight loss, swollen lymph nodes, body aches, muscle aches, chest pain, shortness of breath, mood changes.  Positive joint swelling  Objective  Blood pressure 140/70, pulse (!) 59, height 5\' 6"  (1.676 m), weight 232 lb (105.2 kg), SpO2 97 %. Systems examined below as of 09/12/17   General: No apparent distress alert and oriented x3 mood and affect normal, dressed appropriately.  HEENT: Pupils equal, extraocular movements intact  Respiratory: Patient's speak in full sentences and does not appear short of breath  Cardiovascular: No lower extremity edema, non tender, no erythema  Skin: Warm dry intact with no signs of infection or rash on extremities or on axial skeleton.  Abdomen: Soft nontender  Neuro: Cranial nerves II through XII are intact, neurovascularly intact in all extremities with 2+ DTRs and 2+ pulses.  Lymph: No lymphadenopathy of posterior or anterior cervical chain or axillae bilaterally.  Gait antalgic MSK:  Non tender with full range of motion and good stability and symmetric strength and tone of shoulders, elbows, wrist, hip, kne bilaterally.  Mild arthritic changes of multiple joints  Knee: Bilateral valgus deformity noted. Large thigh to calf ratio.  Tender to palpation over medial and PF joint line.  ROM full in flexion and extension and lower leg rotation. instability with valgus force.  painful patellar compression. Patellar glide with moderate crepitus. Patellar and quadriceps tendons unremarkable. Hamstring and quadriceps strength is normal.  Ankles bilaterally show mild to moderate osteophytic changes.  Patient does have swelling over the medial aspect  of the ankles bilaterally.  Patient still has a peroneal cyst noted on the right ankle.  Mild limitation in all planes.  More pain over the posterior tibialis tendon.   Impression and Recommendations:     This case required medical decision making of moderate complexity.      Note: This dictation was prepared with Dragon dictation along with smaller phrase technology. Any transcriptional errors that result from this process are unintentional.

## 2017-09-12 ENCOUNTER — Encounter: Payer: Self-pay | Admitting: Family Medicine

## 2017-09-12 ENCOUNTER — Ambulatory Visit (INDEPENDENT_AMBULATORY_CARE_PROVIDER_SITE_OTHER): Payer: BLUE CROSS/BLUE SHIELD | Admitting: Family Medicine

## 2017-09-12 VITALS — BP 140/70 | HR 59 | Ht 66.0 in | Wt 232.0 lb

## 2017-09-12 DIAGNOSIS — M79672 Pain in left foot: Secondary | ICD-10-CM

## 2017-09-12 DIAGNOSIS — M76829 Posterior tibial tendinitis, unspecified leg: Secondary | ICD-10-CM | POA: Insufficient documentation

## 2017-09-12 DIAGNOSIS — M2140 Flat foot [pes planus] (acquired), unspecified foot: Secondary | ICD-10-CM

## 2017-09-12 DIAGNOSIS — G4733 Obstructive sleep apnea (adult) (pediatric): Secondary | ICD-10-CM | POA: Diagnosis not present

## 2017-09-12 DIAGNOSIS — M79671 Pain in right foot: Secondary | ICD-10-CM

## 2017-09-12 DIAGNOSIS — M216X1 Other acquired deformities of right foot: Secondary | ICD-10-CM | POA: Diagnosis not present

## 2017-09-12 NOTE — Assessment & Plan Note (Signed)
Has arthritis noted as well.  Patient does have some insufficiency of the posterior tibialis.  Will be sent to formal physical therapy.  Encourage weight loss, patient declined bracing, discussed compression socks and topical anti-inflammatories, if no improvement will consider injections.

## 2017-09-12 NOTE — Patient Instructions (Signed)
Good to se eyo u PT will be calling you  Ice 20 minutes 2 times daily. Usually after activity and before bed. pennsaid pinkie amount topically 2 times daily as needed.  Heel lift under the orthotics may help as well and can get those over the counter.  See me again in 4 weeks

## 2017-09-12 NOTE — Assessment & Plan Note (Signed)
Bilateral, sent to physical therapy, patient declined bracing, will consider heel lifts.  Follow-up again in 4 weeks

## 2017-09-17 DIAGNOSIS — M25562 Pain in left knee: Secondary | ICD-10-CM | POA: Diagnosis not present

## 2017-09-18 DIAGNOSIS — F411 Generalized anxiety disorder: Secondary | ICD-10-CM | POA: Diagnosis not present

## 2017-09-18 DIAGNOSIS — F900 Attention-deficit hyperactivity disorder, predominantly inattentive type: Secondary | ICD-10-CM | POA: Diagnosis not present

## 2017-09-18 DIAGNOSIS — F3342 Major depressive disorder, recurrent, in full remission: Secondary | ICD-10-CM | POA: Diagnosis not present

## 2017-09-22 DIAGNOSIS — H5710 Ocular pain, unspecified eye: Secondary | ICD-10-CM | POA: Diagnosis not present

## 2017-09-22 DIAGNOSIS — I10 Essential (primary) hypertension: Secondary | ICD-10-CM | POA: Diagnosis not present

## 2017-09-23 ENCOUNTER — Ambulatory Visit: Payer: Self-pay | Admitting: Internal Medicine

## 2017-09-24 ENCOUNTER — Other Ambulatory Visit (INDEPENDENT_AMBULATORY_CARE_PROVIDER_SITE_OTHER): Payer: BLUE CROSS/BLUE SHIELD

## 2017-09-24 ENCOUNTER — Encounter: Payer: Self-pay | Admitting: Internal Medicine

## 2017-09-24 ENCOUNTER — Ambulatory Visit (INDEPENDENT_AMBULATORY_CARE_PROVIDER_SITE_OTHER): Payer: BLUE CROSS/BLUE SHIELD | Admitting: Internal Medicine

## 2017-09-24 VITALS — BP 114/72 | HR 64 | Temp 97.7°F | Ht 66.0 in | Wt 234.0 lb

## 2017-09-24 DIAGNOSIS — E785 Hyperlipidemia, unspecified: Secondary | ICD-10-CM

## 2017-09-24 DIAGNOSIS — T380X5A Adverse effect of glucocorticoids and synthetic analogues, initial encounter: Secondary | ICD-10-CM

## 2017-09-24 DIAGNOSIS — Z114 Encounter for screening for human immunodeficiency virus [HIV]: Secondary | ICD-10-CM

## 2017-09-24 DIAGNOSIS — Z0001 Encounter for general adult medical examination with abnormal findings: Secondary | ICD-10-CM

## 2017-09-24 DIAGNOSIS — I10 Essential (primary) hypertension: Secondary | ICD-10-CM

## 2017-09-24 DIAGNOSIS — E099 Drug or chemical induced diabetes mellitus without complications: Secondary | ICD-10-CM | POA: Diagnosis not present

## 2017-09-24 DIAGNOSIS — H109 Unspecified conjunctivitis: Secondary | ICD-10-CM | POA: Diagnosis not present

## 2017-09-24 LAB — CBC WITH DIFFERENTIAL/PLATELET
BASOS ABS: 0.1 10*3/uL (ref 0.0–0.1)
Basophils Relative: 1.1 % (ref 0.0–3.0)
Eosinophils Absolute: 0 10*3/uL (ref 0.0–0.7)
Eosinophils Relative: 0.5 % (ref 0.0–5.0)
HCT: 39.5 % (ref 36.0–46.0)
Hemoglobin: 13.2 g/dL (ref 12.0–15.0)
Lymphocytes Relative: 21.7 % (ref 12.0–46.0)
Lymphs Abs: 1.3 10*3/uL (ref 0.7–4.0)
MCHC: 33.5 g/dL (ref 30.0–36.0)
MCV: 82.8 fl (ref 78.0–100.0)
MONO ABS: 0.9 10*3/uL (ref 0.1–1.0)
Monocytes Relative: 15 % — ABNORMAL HIGH (ref 3.0–12.0)
NEUTROS PCT: 61.7 % (ref 43.0–77.0)
Neutro Abs: 3.8 10*3/uL (ref 1.4–7.7)
PLATELETS: 223 10*3/uL (ref 150.0–400.0)
RBC: 4.77 Mil/uL (ref 3.87–5.11)
RDW: 15.4 % (ref 11.5–15.5)
WBC: 6.1 10*3/uL (ref 4.0–10.5)

## 2017-09-24 LAB — URINALYSIS, ROUTINE W REFLEX MICROSCOPIC
Bilirubin Urine: NEGATIVE
Hgb urine dipstick: NEGATIVE
Ketones, ur: NEGATIVE
LEUKOCYTES UA: NEGATIVE
Nitrite: NEGATIVE
PH: 7 (ref 5.0–8.0)
RBC / HPF: NONE SEEN (ref 0–?)
SPECIFIC GRAVITY, URINE: 1.01 (ref 1.000–1.030)
Total Protein, Urine: NEGATIVE
Urine Glucose: NEGATIVE
Urobilinogen, UA: 0.2 (ref 0.0–1.0)

## 2017-09-24 LAB — LIPID PANEL
CHOL/HDL RATIO: 3
Cholesterol: 161 mg/dL (ref 0–200)
HDL: 51.3 mg/dL (ref >39.00)
NONHDL: 109.91
Triglycerides: 232 mg/dL — ABNORMAL HIGH (ref 0.0–149.0)
VLDL: 46.4 mg/dL — AB (ref 0.0–40.0)

## 2017-09-24 LAB — HEMOGLOBIN A1C: HEMOGLOBIN A1C: 6 % (ref 4.6–6.5)

## 2017-09-24 LAB — BASIC METABOLIC PANEL
BUN: 29 mg/dL — ABNORMAL HIGH (ref 6–23)
CALCIUM: 10.4 mg/dL (ref 8.4–10.5)
CHLORIDE: 96 meq/L (ref 96–112)
CO2: 33 meq/L — AB (ref 19–32)
Creatinine, Ser: 1.27 mg/dL — ABNORMAL HIGH (ref 0.40–1.20)
GFR: 44.91 mL/min — ABNORMAL LOW (ref >60.00)
GLUCOSE: 111 mg/dL — AB (ref 70–99)
Potassium: 3.8 mEq/L (ref 3.5–5.1)
SODIUM: 136 meq/L (ref 135–145)

## 2017-09-24 LAB — HEPATIC FUNCTION PANEL
ALT: 31 U/L (ref 0–35)
AST: 24 U/L (ref 0–37)
Albumin: 4.2 g/dL (ref 3.5–5.2)
Alkaline Phosphatase: 113 U/L (ref 39–117)
BILIRUBIN TOTAL: 0.4 mg/dL (ref 0.2–1.2)
Bilirubin, Direct: 0.1 mg/dL (ref 0.0–0.3)
Total Protein: 7.5 g/dL (ref 6.0–8.3)

## 2017-09-24 LAB — TSH: TSH: 2.74 u[IU]/mL (ref 0.35–4.50)

## 2017-09-24 LAB — MICROALBUMIN / CREATININE URINE RATIO
CREATININE, U: 49.8 mg/dL
MICROALB/CREAT RATIO: 1.4 mg/g (ref 0.0–30.0)
Microalb, Ur: <0.7 mg/dL (ref 0.0–1.9)

## 2017-09-24 LAB — LDL CHOLESTEROL, DIRECT: Direct LDL: 50 mg/dL

## 2017-09-24 MED ORDER — AZELASTINE HCL 0.05 % OP SOLN: 1.0000 [drp] | Freq: Two times a day (BID) | OPHTHALMIC | 12 refills | Status: DC

## 2017-09-24 MED ORDER — TRIAMCINOLONE ACETONIDE 55 MCG/ACT NA AERO: 2.0000 | INHALATION_SPRAY | Freq: Every day | NASAL | 12 refills | Status: DC

## 2017-09-24 NOTE — Assessment & Plan Note (Signed)
?   Allergic, for optivar and nasacort asd,  to f/u any worsening symptoms or concerns  In addition to the time spent performing CPE, I spent an additional 25 minutes face to face,in which greater than 50% of this time was spent in counseling and coordination of care for patient's acute illness as documented, including the differential dx, treatment, further evaluation and other management of bilateral conjunctivitis, DM,HLD and HTN

## 2017-09-24 NOTE — Assessment & Plan Note (Signed)
stable overall by history and exam, recent data reviewed with pt, and pt to continue medical treatment as before,  to f/u any worsening symptoms or concerns  

## 2017-09-24 NOTE — Assessment & Plan Note (Addendum)
Lab Results  Component Value Date   HGBA1C 6.0 09/24/2017  stable overall by history and exam, recent data reviewed with pt, and pt to continue medical treatment as before,  to f/u any worsening symptoms or concerns, also refer to podiatry

## 2017-09-24 NOTE — Progress Notes (Signed)
Subjective:    Patient ID: Tara [name], female    DOB: [birth year], 67 y.o.   MRN: [id number]  HPI  Here for wellness and f/u;  Overall doing ok;  Pt denies Chest pain, worsening SOB, DOE, wheezing, orthopnea, PND, worsening LE edema, palpitations, dizziness or syncope.  Pt denies neurological change such as new headache, facial or extremity weakness.  Pt denies polydipsia, polyuria, or low sugar symptoms. Pt states overall good compliance with treatment and medications, good tolerability, and has been trying to follow appropriate diet.  Pt denies worsening depressive symptoms, suicidal ideation or panic. No fever, night sweats, wt loss, loss of appetite, or other constitutional symptoms.  Pt states good ability with ADL's, has low fall risk, home safety reviewed and adequate, no other significant changes in hearing or vision, and only occasionally active with exercise, slowing down in recent years.   Wt Readings from Last 3 Encounters:  09/24/17 234 lb (106.1 kg)  09/12/17 232 lb (105.2 kg)  08/15/17 231 lb (104.8 kg)  Also c/o mod constant bilat eye discomfort, redness, swelling and irritation without fever or drianage; may have had some dry eye and tx with restasis by optho but reports no improved in 2.5 months.  Has known left cataract, and also other such as intermittent blurred vision, dizziness, and light sensitivity at times.   Past Medical History:  Diagnosis Date  . Anemia   . ANKLE PAIN, LEFT 04/01/2008  . ANXIETY 04/17/2007  . Arthritis   . Chronic kidney disease   . COLONIC POLYPS, HX OF 08/01/2007  . CONTUSIONS, MULTIPLE 04/01/2009  . DEPRESSION 04/17/2007  . DIZZINESS 08/01/2007  . DYSPNEA 08/01/2007   with exertion  . Enlargement of lymph nodes 08/13/2007  . GLUCOSE INTOLERANCE 08/01/2007  . 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 HYPERLIPIDEMIA 08/01/2007  . HYPERSOMNIA 07/28/2008  . HYPERTENSION 04/17/2007  . Impaired glucose tolerance 03/23/2011  . JOINT EFFUSION, LEFT KNEE  06/02/2010  . Loose body in knee 04/01/2009  . Migraines    "stopped 3-4 yr ago" (07/23/2012)  . Morbid obesity ([place]) 04/20/2007  . OTHER DISEASES OF LUNG NOT ELSEWHERE CLASSIFIED 08/01/2007  . Pain in joint, lower leg 04/01/2009  . PERIPHERAL EDEMA 04/21/2009  . Pre-diabetes   . Sarcoidosis 09/25/2007  . SHOULDER PAIN, LEFT 04/01/2009  . Sleep apnea    does not use Cpap   Past Surgical History:  Procedure Laterality Date  . BREAST SURGERY     Biopsy benign  . CATARACT EXTRACTION    . COMBINED MEDIASTINOSCOPY AND BRONCHOSCOPY  08/2007  . COMBINED MEDIASTINOSCOPY AND BRONCHOSCOPY  2009  . Dental implant    . FRACTURE SURGERY  ?02/1997   "left upper arm; put rod in" (07/23/2012)  . GUM SURGERY  2000-?2009   "several ORs; soft tissue graft; took material from roof of mouth" (07/23/2012  . KNEE ARTHROSCOPY  03/2004; 06/2009   "right; left" Dr. Theda Sers  . LYMPH NODE BIOPSY  ~ 2009   "for sarcoidosis; don't know exactly which nodes" (07/23/2102)  . MYOMECTOMY  1994   Open  . REFRACTIVE SURGERY  08/1998   "both eyes" (07/23/2012)  . TOTAL KNEE ARTHROPLASTY Left 09/24/2016   Procedure: TOTAL KNEE ARTHROPLASTY;  Surgeon: Dorna Leitz, MD;  Location: [facility];  Service: Orthopedics;  Laterality: Left;    reports that  has never smoked. she has never used smokeless tobacco. She reports that she drinks alcohol. She reports that she does not use drugs. family history includes Asthma  in her sister; Diabetes in her mother; Heart disease (age of onset: 58) in her father; Hypertension in her father, mother, and sister; Stroke in her mother. Allergies  Allergen Reactions  . Fluoxetine Hcl Other (See Comments)    (PROZAC) Suicidal thoughts  . Chocolate Other (See Comments)    SOMETIMES CAUSES SEVERE HEADACHES  . Floxin [Ocuflox] Anxiety    shaky  . Other Nausea And Vomiting    INSTANT ICED TEA PACKETS  . Sulfonamide Derivatives Rash   Current Outpatient Medications on File Prior to Visit  Medication Sig  Dispense Refill  . allopurinol (ZYLOPRIM) 100 MG tablet Take 1 tablet (100 mg total) by mouth daily. 30 tablet 6  . amphetamine-dextroamphetamine (ADDERALL) 30 MG tablet Take 30 mg by mouth 2 (two) times daily.    Marland Kitchen antiseptic oral rinse (BIOTENE) LIQD 15 mLs by Mouth Rinse route 2 (two) times daily as needed for dry mouth.    Marland Kitchen aspirin EC 81 MG tablet Take 81 mg by mouth daily.    Marland Kitchen atenolol (TENORMIN) 50 MG tablet TAKE 1 TABLET(50 MG) BY MOUTH DAILY 90 tablet 1  . Carboxymethylcell-Hypromellose (GENTEAL OP) Place 1-2 drops into both eyes 3 (three) times daily as needed (for dry eyes/irritated eyes).    . cloNIDine (CATAPRES) 0.1 MG tablet Take 1 tablet (0.1 mg total) by mouth 2 (two) times daily. 180 tablet 3  . clorazepate (TRANXENE) 7.5 MG tablet Take 7.5 mg by mouth 3 (three) times daily.    Marland Kitchen desvenlafaxine (PRISTIQ) 100 MG 24 hr tablet Take 100 mg by mouth at bedtime.    Marland Kitchen DILT-XR 240 MG 24 hr capsule TAKE 1 CAPSULE(240 MG) BY MOUTH DAILY 90 capsule 2  . ferrous sulfate 325 (65 FE) MG tablet Take 325 mg by mouth daily with breakfast.    . furosemide (LASIX) 40 MG tablet Take 1 tablet (40 mg total) by mouth 2 (two) times daily. (Patient taking differently: Take 40 mg by mouth daily. ) 30 tablet 0  . hydrALAZINE (APRESOLINE) 100 MG tablet Take 1 tablet (100 mg total) by mouth every 8 (eight) hours. (Patient taking differently: Take 100 mg by mouth 3 (three) times daily. )    . latanoprost (XALATAN) 0.005 % ophthalmic solution Place 1 drop into both eyes at bedtime.    . Melatonin 5 MG TABS Take 10 mg by mouth at bedtime.    . Multiple Vitamins-Minerals (MULTIVITAMIN WITH MINERALS) tablet Take 1 tablet by mouth daily.    . Omega 3 1200 MG CAPS Take 2,400 mg by mouth at bedtime.    . timolol (TIMOPTIC) 0.5 % ophthalmic solution Place 1 drop into both eyes 2 (two) times daily.  0  . zaleplon (SONATA) 10 MG capsule Take 10 mg by mouth at bedtime as needed for sleep.     No current  facility-administered medications on file prior to visit.    Review of Systems Constitutional: Negative for other unusual diaphoresis, sweats, appetite or weight changes HENT: Negative for other worsening hearing loss, ear pain, facial swelling, mouth sores or neck stiffness.   Eyes: Negative for other worsening pain, redness or other visual disturbance.  Respiratory: Negative for other stridor or swelling Cardiovascular: Negative for other palpitations or other chest pain  Gastrointestinal: Negative for worsening diarrhea or loose stools, blood in stool, distention or other pain Genitourinary: Negative for hematuria, flank pain or other change in urine volume.  Musculoskeletal: Negative for myalgias or other joint swelling.  Skin: Negative for other color  change, or other wound or worsening drainage.  Neurological: Negative for other syncope or numbness. Hematological: Negative for other adenopathy or swelling Psychiatric/Behavioral: Negative for hallucinations, other worsening agitation, SI, self-injury, or new decreased concentration All other system neg per pt    Objective:   Physical Exam BP 114/72   Pulse 64   Temp 97.7 F (36.5 C) (Oral)   Ht 5\' 6"  (1.676 m)   Wt 234 lb (106.1 kg)   SpO2 96%   BMI 37.77 kg/m  VS noted, not ill appearing Constitutional: Pt is oriented to person, place, and time. Appears well-developed and well-nourished, in no significant distress and comfortable Head: Normocephalic and atraumatic  Eyes: Conjunctivae with midl erythema and bilat upper and lower lid redness and sswelling, and EOM are normal. Pupils are equal, round, and reactive to light Right Ear: External ear normal without discharge Left Ear: External ear normal without discharge Nose: Nose without discharge or deformity Mouth/Throat: Oropharynx is without other ulcerations and moist  Neck: Normal range of motion. Neck supple. No JVD present. No tracheal deviation present or significant  neck LA or mass Cardiovascular: Normal rate, regular rhythm, normal heart sounds and intact distal pulses.  Pulmonary/Chest: WOB normal and breath sounds without rales or wheezing  Abdominal: Soft. Bowel sounds are normal. NT. No HSM  Musculoskeletal: Normal range of motion. Exhibits no edema Lymphadenopathy: Has no other cervical adenopathy.  Neurological: Pt is alert and oriented to person, place, and time. Pt has normal reflexes. No cranial nerve deficit. Motor grossly intact, Gait intact Skin: Skin is warm and dry. No rash noted or new ulcerations Psychiatric:  Has normal mood and affect. Behavior is normal without agitation No other exam findings Lab Results  Component Value Date   WBC 6.1 09/24/2017   HGB 13.2 09/24/2017   HCT 39.5 09/24/2017   PLT 223.0 09/24/2017   GLUCOSE 111 (H) 09/24/2017   CHOL 161 09/24/2017   TRIG 232.0 (H) 09/24/2017   HDL 51.30 09/24/2017   LDLDIRECT 50.0 09/24/2017   LDLCALC 78 03/29/2016   ALT 31 09/24/2017   AST 24 09/24/2017   NA 136 09/24/2017   K 3.8 09/24/2017   CL 96 09/24/2017   CREATININE 1.27 (H) 09/24/2017   BUN 29 (H) 09/24/2017   CO2 33 (H) 09/24/2017   TSH 2.74 09/24/2017   INR 0.98 09/17/2016   HGBA1C 6.0 09/24/2017   MICROALBUR <0.7 09/24/2017        Assessment & Plan:

## 2017-09-24 NOTE — Patient Instructions (Signed)
Please take all new medication as prescribed - the optivar eye drops and nasacort  You will be contacted regarding the referral for: podiatry  Please continue all other medications as before, and refills have been done if requested.  Please have the pharmacy call with any other refills you may need.  Please continue your efforts at being more active, low cholesterol diet, and weight control.  You are otherwise up to date with prevention measures today.  Please keep your appointments with your specialists as you may have planned  Please go to the LAB in the Basement (turn left off the elevator) for the tests to be done today  You will be contacted by phone if any changes need to be made immediately.  Otherwise, you will receive a letter about your results with an explanation, but please check with MyChart first.  Please remember to sign up for MyChart if you have not done so, as this will be important to you in the future with finding out test results, communicating by private email, and scheduling acute appointments online when needed.  Please return in 6 months, or sooner if needed, with Lab testing done 3-5 days before

## 2017-09-24 NOTE — Assessment & Plan Note (Signed)

## 2017-09-25 LAB — HIV ANTIBODY (ROUTINE TESTING W REFLEX): HIV: NONREACTIVE

## 2017-09-28 ENCOUNTER — Other Ambulatory Visit: Payer: Self-pay | Admitting: Internal Medicine

## 2017-09-30 ENCOUNTER — Other Ambulatory Visit: Payer: Self-pay

## 2017-09-30 ENCOUNTER — Encounter: Payer: Self-pay | Admitting: Physical Therapy

## 2017-09-30 ENCOUNTER — Ambulatory Visit: Payer: BLUE CROSS/BLUE SHIELD | Attending: Family Medicine | Admitting: Physical Therapy

## 2017-09-30 DIAGNOSIS — M25571 Pain in right ankle and joints of right foot: Secondary | ICD-10-CM | POA: Diagnosis not present

## 2017-09-30 DIAGNOSIS — M25672 Stiffness of left ankle, not elsewhere classified: Secondary | ICD-10-CM | POA: Diagnosis not present

## 2017-09-30 DIAGNOSIS — R262 Difficulty in walking, not elsewhere classified: Secondary | ICD-10-CM | POA: Insufficient documentation

## 2017-09-30 DIAGNOSIS — M25572 Pain in left ankle and joints of left foot: Secondary | ICD-10-CM | POA: Insufficient documentation

## 2017-09-30 DIAGNOSIS — M25671 Stiffness of right ankle, not elsewhere classified: Secondary | ICD-10-CM | POA: Diagnosis not present

## 2017-09-30 DIAGNOSIS — R600 Localized edema: Secondary | ICD-10-CM | POA: Diagnosis not present
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
## 2017-10-01 DIAGNOSIS — H10413 Chronic giant papillary conjunctivitis, bilateral: Secondary | ICD-10-CM | POA: Diagnosis not present

## 2017-10-01 DIAGNOSIS — H04123 Dry eye syndrome of bilateral lacrimal glands: Secondary | ICD-10-CM | POA: Diagnosis not present

## 2017-10-09 ENCOUNTER — Ambulatory Visit: Payer: BLUE CROSS/BLUE SHIELD | Admitting: Physical Therapy

## 2017-10-09 ENCOUNTER — Encounter: Payer: Self-pay | Admitting: Physical Therapy

## 2017-10-09 DIAGNOSIS — R600 Localized edema: Secondary | ICD-10-CM | POA: Diagnosis not present

## 2017-10-09 DIAGNOSIS — M25672 Stiffness of left ankle, not elsewhere classified: Secondary | ICD-10-CM | POA: Diagnosis not present

## 2017-10-09 DIAGNOSIS — M25572 Pain in left ankle and joints of left foot: Secondary | ICD-10-CM | POA: Diagnosis not present

## 2017-10-09 DIAGNOSIS — M25571 Pain in right ankle and joints of right foot: Secondary | ICD-10-CM

## 2017-10-09 DIAGNOSIS — M25671 Stiffness of right ankle, not elsewhere classified: Secondary | ICD-10-CM | POA: Diagnosis not present

## 2017-10-09 DIAGNOSIS — R262 Difficulty in walking, not elsewhere classified: Secondary | ICD-10-CM | POA: Diagnosis not present
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
## 2017-10-10 NOTE — Progress Notes (Signed)
[name] [name] [place], [place] [address] Phone: [phone] Subjective:    I'm seeing this patient by the request  of:    CC: Bilateral ankle pain follow-up  [id number]  Tara [name] is a 67 y.o. female coming in with complaint of bilateral ankle pain.  Patient has been seen previously.  Had more of a posterior tibialis tendinitis and insufficiency with over pronation of the foot.  Been put in custom orthotics and sent to physical therapy.  Has been to times to physical therapy.  Feels like the ankle has improved in range of motion.  She is been doing the exercises more regularly.  Noticing more swelling of the ankles themselves.     Past Medical History:  Diagnosis Date  . Anemia   . ANKLE PAIN, LEFT 04/01/2008  . ANXIETY 04/17/2007  . Arthritis   . Chronic kidney disease   . COLONIC POLYPS, HX OF 08/01/2007  . CONTUSIONS, MULTIPLE 04/01/2009  . DEPRESSION 04/17/2007  . DIZZINESS 08/01/2007  . DYSPNEA 08/01/2007   with exertion  . Enlargement of lymph nodes 08/13/2007  . GLUCOSE INTOLERANCE 08/01/2007  . 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 HYPERLIPIDEMIA 08/01/2007  . HYPERSOMNIA 07/28/2008  . HYPERTENSION 04/17/2007  . Impaired glucose tolerance 03/23/2011  . JOINT EFFUSION, LEFT KNEE 06/02/2010  . Loose body in knee 04/01/2009  . Migraines    "stopped 3-4 yr ago" (07/23/2012)  . Morbid obesity ([place]) 04/20/2007  . OTHER DISEASES OF LUNG NOT ELSEWHERE CLASSIFIED 08/01/2007  . Pain in joint, lower leg 04/01/2009  . PERIPHERAL EDEMA 04/21/2009  . Pre-diabetes   . Sarcoidosis 09/25/2007  . SHOULDER PAIN, LEFT 04/01/2009  . Sleep apnea    does not use Cpap   Past Surgical History:  Procedure Laterality Date  . BREAST SURGERY     Biopsy benign  . CATARACT EXTRACTION    . COMBINED MEDIASTINOSCOPY AND BRONCHOSCOPY  08/2007  . COMBINED MEDIASTINOSCOPY AND BRONCHOSCOPY  2009  . Dental implant    . FRACTURE SURGERY  ?02/1997   "left upper  arm; put rod in" (07/23/2012)  . GUM SURGERY  2000-?2009   "several ORs; soft tissue graft; took material from roof of mouth" (07/23/2012  . KNEE ARTHROSCOPY  03/2004; 06/2009   "right; left" Dr. Theda Sers  . LYMPH NODE BIOPSY  ~ 2009   "for sarcoidosis; don't know exactly which nodes" (07/23/2102)  . MYOMECTOMY  1994   Open  . REFRACTIVE SURGERY  08/1998   "both eyes" (07/23/2012)  . TOTAL KNEE ARTHROPLASTY Left 09/24/2016   Procedure: TOTAL KNEE ARTHROPLASTY;  Surgeon: Dorna Leitz, MD;  Location: [facility];  Service: Orthopedics;  Laterality: Left;   Social History   Socioeconomic History  . Marital status: Widowed    Spouse name: Not on file  . Number of children: Not on file  . Years of education: Not on file  . Highest education level: Not on file  Occupational History  . Occupation: Designer, jewellery  Social Needs  . Financial resource strain: Not on file  . Food insecurity:    Worry: Not on file    Inability: Not on file  . Transportation needs:    Medical: Not on file    Non-medical: Not on file  Tobacco Use  . Smoking status: Never Smoker  . Smokeless tobacco: Never Used  Substance and Sexual Activity  . Alcohol use: Yes    Comment: Occas  . Drug use: No  .  Sexual activity: Not Currently    Birth control/protection: Post-menopausal    Comment: 1st intercourse 27 yo-1 partner  Lifestyle  . Physical activity:    Days per week: Not on file    Minutes per session: Not on file  . Stress: Not on file  Relationships  . Social connections:    Talks on phone: Not on file    Gets together: Not on file    Attends religious service: Not on file    Active member of club or organization: Not on file    Attends meetings of clubs or organizations: Not on file    Relationship status: Not on file  Other Topics Concern  . Not on file  Social History Narrative   Lives with 4 cats   Allergies  Allergen Reactions  . Fluoxetine Hcl Other (See Comments)    (PROZAC) Suicidal thoughts    . Chocolate Other (See Comments)    SOMETIMES CAUSES SEVERE HEADACHES  . Floxin [Ocuflox] Anxiety    shaky  . Other Nausea And Vomiting    INSTANT ICED TEA PACKETS  . Sulfonamide Derivatives Rash   Family History  Problem Relation Age of Onset  . Diabetes Mother   . Hypertension Mother   . Stroke Mother   . Hypertension Sister   . Asthma Sister   . Heart disease Father 48  . Hypertension Father   . Colon cancer Neg Hx      Past medical history, social, surgical and family history all reviewed in electronic medical record.  No pertanent information unless stated regarding to the chief complaint.   Review of Systems:Review of systems updated and as accurate as of 10/11/17  No headache, visual changes, nausea, vomiting, diarrhea, constipation, dizziness, abdominal pain, skin rash, fevers, chills, night sweats, weight loss, swollen lymph nodes, body aches, chest pain, shortness of breath, mood changes.  Positive muscle aches, joint swelling  Objective  Blood pressure (!) 148/90, pulse 72, height 5\' 6"  (1.676 m), weight 234 lb (106.1 kg), SpO2 98 %. Systems examined below as of 10/11/17   General: No apparent distress alert and oriented x3 mood and affect normal, dressed appropriately.  HEENT: Pupils equal, extraocular movements intact  Respiratory: Patient's speak in full sentences and does not appear short of breath  Cardiovascular: No lower extremity edema, non tender, no erythema  Skin: Warm dry intact with no signs of infection or rash on extremities or on axial skeleton.  Abdomen: Soft nontender  Neuro: Cranial nerves II through XII are intact, neurovascularly intact in all extremities with 2+ DTRs and 2+ pulses.  Lymph: No lymphadenopathy of posterior or anterior cervical chain or axillae bilaterally.  Gait normal with good balance and coordination.  MSK:  Non tender with full range of motion and good stability and symmetric strength and tone of shoulders, elbows, wrist,  hip, knee bilaterally.  Mild arthritic changes of multiple joints  Foot exam and ankle exam shows patient still has posterior tibialis insufficiency with over pronation of the hindfoot.  Patient has more swelling but seems to be more of a edema and not over the posterior tibialis tendon.  Patient has good range of motion lacking the only the last 5 degrees of dorsiflexion of the foot bilaterally     Impression and Recommendations:     This case required medical decision making of moderate complexity.      Note: This dictation was prepared with Dragon dictation along with smaller phrase technology. Any transcriptional errors that result  from this process are unintentional.

## 2017-10-11 ENCOUNTER — Ambulatory Visit (INDEPENDENT_AMBULATORY_CARE_PROVIDER_SITE_OTHER): Payer: BLUE CROSS/BLUE SHIELD | Admitting: Family Medicine

## 2017-10-11 ENCOUNTER — Encounter: Payer: Self-pay | Admitting: Family Medicine

## 2017-10-11 DIAGNOSIS — M2140 Flat foot [pes planus] (acquired), unspecified foot: Secondary | ICD-10-CM | POA: Diagnosis not present

## 2017-10-11 DIAGNOSIS — M76829 Posterior tibial tendinitis, unspecified leg: Secondary | ICD-10-CM

## 2017-10-11 DIAGNOSIS — G4733 Obstructive sleep apnea (adult) (pediatric): Secondary | ICD-10-CM | POA: Diagnosis not present

## 2017-10-11 NOTE — Assessment & Plan Note (Signed)
Improvement at this time.  Patient does have more pitting edema of the lower extremities.  Likely secondary to either the acute kidney injuries or possible other difficulties.  I do believe that patient does have dependent edema.  Patient is to do compression, we discussed different medications could be contributing to this.  Patient will continue all medications at this point but will consider increasing Lasix on a short course.  Patient will follow up with me again in 2 months

## 2017-10-11 NOTE — Patient Instructions (Signed)
Good to see you  Edema more Continue the compression socks I would stop the restasis for now and see if that helps The ankles look great  Go to PT as much as you want See me again in 2-3 months

## 2017-10-14 ENCOUNTER — Encounter: Payer: Self-pay | Admitting: Physical Therapy

## 2017-10-14 ENCOUNTER — Ambulatory Visit: Payer: BLUE CROSS/BLUE SHIELD | Attending: Family Medicine | Admitting: Physical Therapy

## 2017-10-14 DIAGNOSIS — M25571 Pain in right ankle and joints of right foot: Secondary | ICD-10-CM

## 2017-10-14 DIAGNOSIS — M25672 Stiffness of left ankle, not elsewhere classified: Secondary | ICD-10-CM

## 2017-10-14 DIAGNOSIS — M25671 Stiffness of right ankle, not elsewhere classified: Secondary | ICD-10-CM | POA: Diagnosis not present

## 2017-10-14 DIAGNOSIS — R262 Difficulty in walking, not elsewhere classified: Secondary | ICD-10-CM | POA: Insufficient documentation

## 2017-10-14 DIAGNOSIS — M25572 Pain in left ankle and joints of left foot: Secondary | ICD-10-CM | POA: Diagnosis not present

## 2017-10-14 DIAGNOSIS — R600 Localized edema: Secondary | ICD-10-CM | POA: Diagnosis not present
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
## 2017-10-17 ENCOUNTER — Encounter: Payer: Self-pay | Admitting: Podiatry

## 2017-10-17 ENCOUNTER — Ambulatory Visit (INDEPENDENT_AMBULATORY_CARE_PROVIDER_SITE_OTHER): Payer: BLUE CROSS/BLUE SHIELD | Admitting: Podiatry

## 2017-10-17 VITALS — BP 150/72 | HR 64

## 2017-10-17 DIAGNOSIS — I872 Venous insufficiency (chronic) (peripheral): Secondary | ICD-10-CM

## 2017-10-17 DIAGNOSIS — B351 Tinea unguium: Secondary | ICD-10-CM | POA: Diagnosis not present

## 2017-10-17 DIAGNOSIS — E1151 Type 2 diabetes mellitus with diabetic peripheral angiopathy without gangrene: Secondary | ICD-10-CM | POA: Diagnosis not present

## 2017-10-21 ENCOUNTER — Encounter: Payer: Self-pay | Admitting: Internal Medicine

## 2017-10-21 ENCOUNTER — Ambulatory Visit (INDEPENDENT_AMBULATORY_CARE_PROVIDER_SITE_OTHER): Payer: BLUE CROSS/BLUE SHIELD | Admitting: Internal Medicine

## 2017-10-21 VITALS — BP 122/78 | HR 67 | Ht 66.0 in | Wt 235.6 lb

## 2017-10-21 DIAGNOSIS — G4733 Obstructive sleep apnea (adult) (pediatric): Secondary | ICD-10-CM | POA: Diagnosis not present

## 2017-10-21 NOTE — Progress Notes (Signed)
HPI F never smoker followed for sarcoid, OSA complicated by hypercalcemia, renal insufficency, glaucoma PFT 02/07/15-minimal obstructive airways disease, minimal diffusion defect, insignificant response to bronchodilator ACE in July, 2017 remained elevated 74 (8-52) NPSG  12/ 11/09 - AhI 1.4/hour I desaturation to 89%, body weight 245 lbs Unattended Home Sleep Test 03/12/17-AHI 27.6/hour, desaturation to 75%, body weight 220 pounds --------------------------------------------------------------------------  06/13/17- 67 year old female never smoker followed for Sarcoid, OSA complicated by hypercalcemia, renal insufficiency, HBP, depression, glaucoma  Unattended Home Sleep Test 03/12/17-AHI 27.6/hour, desaturation to 75%, body weight 220 pounds CPAP auto 5-20/[facility]> today to 10-20 OSA: DME [facility].Pt tries to use CPAP as much as possible.  Has trouble getting comfortable with CPAP and says the machine was not "plugging in right" for a while.  Airflow dries her eyes. Download 67% compliance, AHI 6/hour Asks we check some labs to follow-up after hospitalization this year for metabolic syndrome complications.  Occasionally feels she is laboring to breathe but admits deconditioned.  Trying to walk more since knee surgery.  10/21/17- 67 year old female never smoker followed for Sarcoid, OSA complicated by hypercalcemia, renal insufficiency, HBP, depression, glaucoma ----OSA follow up, has not been using cpap due to dry eyes.  She describes a major problem with burning and irritated dry eyes for which she has been seeing her ophthalmologist.  She says this was why she has not been using CPAP.  She does not know how to adjust her humidifier.  We discussed alternatives.  She understands the option of an oral appliance but wants to try formal mask fitting for better seal, and adjustment of humidifier before giving up on CPAP.  ROS- see HPI  + = pos Constitutional:     weight loss, no-night sweats, fevers,  chills, + fatigue, lassitude. HEENT:   No-  headaches, difficulty swallowing, tooth/dental problems, sore throat,       No-  sneezing, itching, ear ache, nasal congestion, post nasal drip,  CV:  No- chest pain, no-orthopnea, PND, swelling in lower extremities, anasarca,dizziness, +palpitations  Resp: + shortness of breath with exertion or at rest.              No-   productive cough,  No non-productive cough,  No- coughing up of blood.              No- wheezing.   Skin: No-   rash or lesions. GI:  No-   heartburn, indigestion, abdominal pain, nausea, vomiting,  GU:  MS:  +  joint pain or swelling.   Neuro-     nothing unusual Psych:  No- change in mood or affect. No depression or anxiety.  No memory loss.  OBJ- Physical Exam   General- Alert, Oriented, Affect-appropriate, Distress- none acute, + overweight Skin- rash-none, lesions- none, excoriation- none, cosmetics smeared on face Lymphadenopathy- none Head- atraumatic            Eyes- Gross vision intact, PERRLA, conjunctivae clear, frequent blinking            Ears- Hearing, canals-normal            Nose- Clear, no-Septal dev, mucus, polyps, erosion, perforation             Throat- Mallampati III, mucosa clear , drainage- none, tonsils- atrophic Neck- flexible , trachea midline, no stridor , thyroid nl, carotid no bruit Chest - symmetrical excursion , unlabored           Heart/CV- RRR + extra beats , no murmur , no gallop  ,  no rub, nl s1 s2                           - JVD- none , + elastic hose, stasis changes- none, varices- none           Lung- clear to P&A, wheeze- none, cough- none , dullness-none, rub- none           Chest wall-  Abd-  Br/ Gen/ Rectal- Not done, not indicated Extrem- cyanosis- none, clubbing, none, atrophy- none, strength- nl Neuro- +blinking, grimacing, ? Tics or frontal lobe ?

## 2017-10-21 NOTE — Patient Instructions (Signed)
Order- schedule CPAP Mask Fitting with daytime sleep center technician   Dx OSA  Order- referral to Dr Augustina Mood, DDS    Consider oral appliance to treat OSA  Please call if we can help

## 2017-10-22 NOTE — Assessment & Plan Note (Signed)
She has not been able to change lifestyle sufficiently to accomplish useful weight loss.  Encouragement offered.

## 2017-10-22 NOTE — Assessment & Plan Note (Signed)
Based on her discussion today we are going to emphasize mask fitting and adjustment of her humidifier.  If that does not allow her comfortable compliant use of CPAP then she agrees to referral for an oral appliance.  Weight loss is also encouraged.

## 2017-10-23 ENCOUNTER — Ambulatory Visit: Payer: BLUE CROSS/BLUE SHIELD | Admitting: Physical Therapy

## 2017-10-23 DIAGNOSIS — R262 Difficulty in walking, not elsewhere classified: Secondary | ICD-10-CM

## 2017-10-23 DIAGNOSIS — M25572 Pain in left ankle and joints of left foot: Secondary | ICD-10-CM

## 2017-10-23 DIAGNOSIS — M25672 Stiffness of left ankle, not elsewhere classified: Secondary | ICD-10-CM

## 2017-10-23 DIAGNOSIS — M25671 Stiffness of right ankle, not elsewhere classified: Secondary | ICD-10-CM | POA: Diagnosis not present

## 2017-10-23 DIAGNOSIS — R600 Localized edema: Secondary | ICD-10-CM | POA: Diagnosis not present

## 2017-10-23 DIAGNOSIS — M25571 Pain in right ankle and joints of right foot: Secondary | ICD-10-CM
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
## 2017-10-28 ENCOUNTER — Ambulatory Visit: Payer: BLUE CROSS/BLUE SHIELD | Admitting: Physical Therapy

## 2017-10-28 ENCOUNTER — Encounter: Payer: Self-pay | Admitting: Physical Therapy

## 2017-10-28 DIAGNOSIS — R600 Localized edema: Secondary | ICD-10-CM

## 2017-10-28 DIAGNOSIS — M25672 Stiffness of left ankle, not elsewhere classified: Secondary | ICD-10-CM

## 2017-10-28 DIAGNOSIS — M25572 Pain in left ankle and joints of left foot: Secondary | ICD-10-CM

## 2017-10-28 DIAGNOSIS — M25671 Stiffness of right ankle, not elsewhere classified: Secondary | ICD-10-CM | POA: Diagnosis not present

## 2017-10-28 DIAGNOSIS — R262 Difficulty in walking, not elsewhere classified: Secondary | ICD-10-CM

## 2017-10-28 DIAGNOSIS — M25571 Pain in right ankle and joints of right foot: Secondary | ICD-10-CM | POA: Diagnosis not present
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
## 2017-10-29 DIAGNOSIS — H0100A Unspecified blepharitis right eye, upper and lower eyelids: Secondary | ICD-10-CM | POA: Diagnosis not present

## 2017-10-29 DIAGNOSIS — H04123 Dry eye syndrome of bilateral lacrimal glands: Secondary | ICD-10-CM | POA: Diagnosis not present

## 2017-10-29 DIAGNOSIS — H0100B Unspecified blepharitis left eye, upper and lower eyelids: Secondary | ICD-10-CM | POA: Diagnosis not present

## 2017-11-05 ENCOUNTER — Encounter: Payer: Self-pay | Admitting: Physical Therapy

## 2017-11-05 ENCOUNTER — Ambulatory Visit: Payer: BLUE CROSS/BLUE SHIELD | Admitting: Physical Therapy

## 2017-11-05 DIAGNOSIS — M25672 Stiffness of left ankle, not elsewhere classified: Secondary | ICD-10-CM | POA: Diagnosis not present

## 2017-11-05 DIAGNOSIS — M25572 Pain in left ankle and joints of left foot: Secondary | ICD-10-CM | POA: Diagnosis not present

## 2017-11-05 DIAGNOSIS — M25671 Stiffness of right ankle, not elsewhere classified: Secondary | ICD-10-CM | POA: Diagnosis not present

## 2017-11-05 DIAGNOSIS — M25571 Pain in right ankle and joints of right foot: Secondary | ICD-10-CM | POA: Diagnosis not present

## 2017-11-05 DIAGNOSIS — R262 Difficulty in walking, not elsewhere classified: Secondary | ICD-10-CM | POA: Diagnosis not present

## 2017-11-05 DIAGNOSIS — R600 Localized edema: Secondary | ICD-10-CM | POA: Diagnosis not present
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
## 2017-11-10 NOTE — Progress Notes (Signed)
  Subjective:  Patient ID: Tara [name], female    DOB: Feb 28, 1951,  MRN: [id number]  Chief Complaint  Patient presents with  . Diabetes    diabetic foot exam  . Nail Problem    bilateral elongated thickened toenails   67 y.o. female returns for diabetic foot care. Last AMBS was unknonwn. Reports swelling in her feet. Denies numbness and tingling in their feet. Denies cramping in legs and thighs.  Objective:   Vitals:   10/17/17 0837  BP: (!) 150/72  Pulse: 64   General AA&O x3. Normal mood and affect.  Vascular Dorsalis pedis pulses present 1+ bilaterally  Posterior tibial pulses absent bilaterally  Capillary refill normal to all digits. Pedal hair growth normal.  Neurologic Epicritic sensation present bilaterally. Protective sensation with 5.07 monofilament  present bilaterally. Vibratory sensation present bilaterally.  Dermatologic No open lesions. Interspaces clear of maceration.  Normal skin temperature and turgor. Hyperkeratotic lesions: None bilaterally. Nails: brittle, onychomycosis, thickening, elongation  Orthopedic: No history of amputation. MMT 5/5 in dorsiflexion, plantarflexion, inversion, and eversion. Normal lower extremity joint ROM without pain or crepitus.    Assessment & Plan:  Patient was evaluated and treated and all questions answered.  Diabetes with PAD, Onychomycosis -Educated on diabetic footcare. Diabetic risk level 1 -At risk foot care provided as below.  Procedure: Nail Debridement Rationale: Patient meets criteria for routine foot care due to Class B findings. Type of Debridement: manual, sharp debridement. Instrumentation: Nail nipper, rotary burr. Number of Nails: 10  Return in about 3 months (around 01/16/2018) for diabetic foot care/debride.

## 2017-11-11 DIAGNOSIS — G4733 Obstructive sleep apnea (adult) (pediatric): Secondary | ICD-10-CM | POA: Diagnosis not present

## 2017-11-12 ENCOUNTER — Ambulatory Visit: Payer: BLUE CROSS/BLUE SHIELD | Admitting: Physical Therapy

## 2017-11-12 ENCOUNTER — Encounter: Payer: Self-pay | Admitting: Physical Therapy

## 2017-11-12 DIAGNOSIS — M25571 Pain in right ankle and joints of right foot: Secondary | ICD-10-CM | POA: Diagnosis not present

## 2017-11-12 DIAGNOSIS — M25671 Stiffness of right ankle, not elsewhere classified: Secondary | ICD-10-CM | POA: Diagnosis not present

## 2017-11-12 DIAGNOSIS — R262 Difficulty in walking, not elsewhere classified: Secondary | ICD-10-CM | POA: Diagnosis not present

## 2017-11-12 DIAGNOSIS — M25572 Pain in left ankle and joints of left foot: Secondary | ICD-10-CM

## 2017-11-12 DIAGNOSIS — R600 Localized edema: Secondary | ICD-10-CM | POA: Diagnosis not present

## 2017-11-12 DIAGNOSIS — M25672 Stiffness of left ankle, not elsewhere classified: Secondary | ICD-10-CM | POA: Diagnosis not present
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
## 2017-11-15 ENCOUNTER — Ambulatory Visit (INDEPENDENT_AMBULATORY_CARE_PROVIDER_SITE_OTHER): Payer: BLUE CROSS/BLUE SHIELD | Admitting: Family Medicine

## 2017-11-15 ENCOUNTER — Encounter: Payer: Self-pay | Admitting: Family Medicine

## 2017-11-15 ENCOUNTER — Ambulatory Visit: Payer: Self-pay | Admitting: *Deleted

## 2017-11-15 VITALS — BP 138/82 | HR 68 | Temp 98.2°F | Ht 66.0 in | Wt 229.0 lb

## 2017-11-15 DIAGNOSIS — M7989 Other specified soft tissue disorders: Secondary | ICD-10-CM | POA: Insufficient documentation

## 2017-11-15 NOTE — Progress Notes (Signed)
Tara [name] - 67 y.o. female MRN [id number]  Date of birth: 1950/08/31  SUBJECTIVE:  Including CC & ROS.  Chief Complaint  Patient presents with  . Bilateral leg swelling    Tara [name] is a 67 y.o. female that is presenting with bilateral leg swelling. Ongoing for one month, increasing for one week. Edema present in lower legs and ankles. Denies injury. She has been in physical therapy for foot swelling.  Denies pain or tenderness. She has been wearing compression stockings daily.  Denies pain when ambulating. She feels her legs are restless at night.  Has started walking outside more. Denies any PND. Does have a dry cough at times.    Review of Systems  Constitutional: Negative for fever.  HENT: Negative for congestion.   Respiratory: Positive for cough. Negative for shortness of breath.   Cardiovascular: Positive for leg swelling. Negative for chest pain.    HISTORY: Past Medical, Surgical, Social, and Family History Reviewed & Updated per EMR.   Pertinent Historical Findings include:  Past Medical History:  Diagnosis Date  . Anemia   . ANKLE PAIN, LEFT 04/01/2008  . ANXIETY 04/17/2007  . Arthritis   . Chronic kidney disease   . COLONIC POLYPS, HX OF 08/01/2007  . CONTUSIONS, MULTIPLE 04/01/2009  . DEPRESSION 04/17/2007  . DIZZINESS 08/01/2007  . DYSPNEA 08/01/2007   with exertion  . Enlargement of lymph nodes 08/13/2007  . GLUCOSE INTOLERANCE 08/01/2007  . 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 HYPERLIPIDEMIA 08/01/2007  . HYPERSOMNIA 07/28/2008  . HYPERTENSION 04/17/2007  . Impaired glucose tolerance 03/23/2011  . JOINT EFFUSION, LEFT KNEE 06/02/2010  . Loose body in knee 04/01/2009  . Migraines    "stopped 3-4 yr ago" (07/23/2012)  . Morbid obesity ([place]) 04/20/2007  . OTHER DISEASES OF LUNG NOT ELSEWHERE CLASSIFIED 08/01/2007  . Pain in joint, lower leg 04/01/2009  . PERIPHERAL EDEMA 04/21/2009  . Pre-diabetes   . Sarcoidosis 09/25/2007  . SHOULDER PAIN, LEFT 04/01/2009  .  Sleep apnea    does not use Cpap    Past Surgical History:  Procedure Laterality Date  . BREAST SURGERY     Biopsy benign  . CATARACT EXTRACTION    . COMBINED MEDIASTINOSCOPY AND BRONCHOSCOPY  08/2007  . COMBINED MEDIASTINOSCOPY AND BRONCHOSCOPY  2009  . Dental implant    . FRACTURE SURGERY  ?02/1997   "left upper arm; put rod in" (07/23/2012)  . GUM SURGERY  2000-?2009   "several ORs; soft tissue graft; took material from roof of mouth" (07/23/2012  . KNEE ARTHROSCOPY  03/2004; 06/2009   "right; left" Dr. Theda Sers  . LYMPH NODE BIOPSY  ~ 2009   "for sarcoidosis; don't know exactly which nodes" (07/23/2102)  . MYOMECTOMY  1994   Open  . REFRACTIVE SURGERY  08/1998   "both eyes" (07/23/2012)  . TOTAL KNEE ARTHROPLASTY Left 09/24/2016   Procedure: TOTAL KNEE ARTHROPLASTY;  Surgeon: Dorna Leitz, MD;  Location: [facility];  Service: Orthopedics;  Laterality: Left;    Allergies  Allergen Reactions  . Fluoxetine Hcl Other (See Comments)    (PROZAC) Suicidal thoughts  . Chocolate Other (See Comments)    SOMETIMES CAUSES SEVERE HEADACHES  . Floxin [Ocuflox] Anxiety    shaky  . Other Nausea And Vomiting    INSTANT ICED TEA PACKETS  . Sulfonamide Derivatives Rash    Family History  Problem Relation Age of Onset  . Diabetes Mother   . Hypertension Mother   . Stroke Mother   .  Hypertension Sister   . Asthma Sister   . Heart disease Father 21  . Hypertension Father   . Colon cancer Neg Hx      Social History   Socioeconomic History  . Marital status: Widowed    Spouse name: Not on file  . Number of children: Not on file  . Years of education: Not on file  . Highest education level: Not on file  Occupational History  . Occupation: Designer, jewellery  Social Needs  . Financial resource strain: Not on file  . Food insecurity:    Worry: Not on file    Inability: Not on file  . Transportation needs:    Medical: Not on file    Non-medical: Not on file  Tobacco Use  . Smoking  status: Never Smoker  . Smokeless tobacco: Never Used  Substance and Sexual Activity  . Alcohol use: Yes    Comment: Occas  . Drug use: No  . Sexual activity: Not Currently    Birth control/protection: Post-menopausal    Comment: 1st intercourse 71 yo-1 partner  Lifestyle  . Physical activity:    Days per week: Not on file    Minutes per session: Not on file  . Stress: Not on file  Relationships  . Social connections:    Talks on phone: Not on file    Gets together: Not on file    Attends religious service: Not on file    Active member of club or organization: Not on file    Attends meetings of clubs or organizations: Not on file    Relationship status: Not on file  . Intimate partner violence:    Fear of current or ex partner: Not on file    Emotionally abused: Not on file    Physically abused: Not on file    Forced sexual activity: Not on file  Other Topics Concern  . Not on file  Social History Narrative   Lives with 4 cats     PHYSICAL EXAM:  VS: BP 138/82 (BP Location: Left Arm, Patient Position: Sitting, Cuff Size: Normal)   Pulse 68   Temp 98.2 F (36.8 C) (Oral)   Ht 5\' 6"  (1.676 m)   Wt 229 lb (103.9 kg)   SpO2 98%   BMI 36.96 kg/m  Physical Exam Gen: NAD, alert, cooperative with exam, well-appearing ENT: normal lips, normal nasal mucosa,  Eye: normal EOM, normal conjunctiva and lids CV:  trace edema, +2 pedal pulses, S1-S2, regular rate and rhythm Resp: no accessory muscle use, non-labored, clear to auscultation bilaterally, no crackles or wheezes. Skin: no rashes, no areas of induration  Neuro: normal tone, normal sensation to touch Psych:  normal insight, alert and oriented MSK: normal gait, normal strength      ASSESSMENT & PLAN:   Leg swelling Appears to be dependent edema. Does have renal insufficiency that could contribute. Unlikely for heart failure.  - continue compression  - elevate legs  - if no improvement consider lab work

## 2017-11-15 NOTE — Telephone Encounter (Signed)
No  Availability today with Dr [name] or anyone at [facility] -  Appointment made with Dr Raeford Razor for today at [facility]  Reason for Disposition . [1] MODERATE leg swelling (e.g., swelling extends up to knees) AND [2] new onset or worsening  Answer Assessment - Initial Assessment Questions 1. ONSET: "When did the swelling start?" (e.g., minutes, hours, days)       6 days ago   Worse  Yesterday  2. LOCATION: "What part of the leg is swollen?"  "Are both legs swollen or just one leg?"        Both legs Left worse than right   3. SEVERITY: "How bad is the swelling?" (e.g., localized; mild, moderate, severe)  - Localized - small area of swelling localized to one leg  - MILD pedal edema - swelling limited to foot and ankle, pitting edema < 1/4 inch (6 mm) deep, rest and elevation eliminate most or all swelling  - MODERATE edema - swelling of lower leg to knee, pitting edema > 1/4 inch (6 mm) deep, rest and elevation only partially reduce swelling  - SEVERE edema - swelling extends above knee, facial or hand swelling present      Severe    Face  Feels  Tight   4. REDNESS: "Does the swelling look red or infected?"      No  5. PAIN: "Is the swelling painful to touch?" If so, ask: "How painful is it?"   (Scale 1-10; mild, moderate or severe)       2   6. FEVER: "Do you have a fever?" If so, ask: "What is it, how was it measured, and when did it start?"       No  At far as  She  Knows   7. CAUSE: "What do you think is causing the leg swelling?"      Not sure  but started doxycline  And  steriod eye drops   2  Weeks  Ago   8. MEDICAL HISTORY: "Do you have a history of heart failure, kidney disease, liver failure, or cancer?"       Kidney disease sarcardosis  9. RECURRENT SYMPTOM: "Have you had leg swelling before?" If so, ask: "When was the last time?" "What happened that time?"         Yes   Sev years  Ago  Was  hospitilized   10. OTHER SYMPTOMS: "Do you have any other symptoms?" (e.g., chest pain,  difficulty breathing)           Gets  Shortness of breath on exertion   11. PREGNANCY: "Is there any chance you are pregnant?" "When was your last menstrual period?"       n/a  Protocols used: LEG SWELLING AND EDEMA-A-AH

## 2017-11-15 NOTE — Assessment & Plan Note (Addendum)
Appears to be dependent edema. Does have renal insufficiency that could contribute. Unlikely for heart failure.  - continue compression  - elevate legs  - if no improvement consider lab work

## 2017-11-15 NOTE — Patient Instructions (Addendum)
Please try to elevate your legs.  Please try to elevate them through the course of the day at work  Please let me know if your symptoms don't improve

## 2017-11-18 ENCOUNTER — Encounter: Payer: Self-pay | Admitting: Physical Therapy

## 2017-11-18 ENCOUNTER — Ambulatory Visit: Payer: BLUE CROSS/BLUE SHIELD | Attending: Family Medicine | Admitting: Physical Therapy

## 2017-11-18 DIAGNOSIS — M25571 Pain in right ankle and joints of right foot: Secondary | ICD-10-CM | POA: Diagnosis not present

## 2017-11-18 DIAGNOSIS — M25671 Stiffness of right ankle, not elsewhere classified: Secondary | ICD-10-CM | POA: Diagnosis not present

## 2017-11-18 DIAGNOSIS — R262 Difficulty in walking, not elsewhere classified: Secondary | ICD-10-CM | POA: Diagnosis not present

## 2017-11-18 DIAGNOSIS — M25572 Pain in left ankle and joints of left foot: Secondary | ICD-10-CM

## 2017-11-18 DIAGNOSIS — M25672 Stiffness of left ankle, not elsewhere classified: Secondary | ICD-10-CM | POA: Insufficient documentation

## 2017-11-18 DIAGNOSIS — R600 Localized edema: Secondary | ICD-10-CM
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
## 2017-11-19 ENCOUNTER — Ambulatory Visit (HOSPITAL_BASED_OUTPATIENT_CLINIC_OR_DEPARTMENT_OTHER): Payer: BLUE CROSS/BLUE SHIELD | Attending: Internal Medicine | Admitting: Radiology

## 2017-11-19 DIAGNOSIS — G4733 Obstructive sleep apnea (adult) (pediatric): Secondary | ICD-10-CM

## 2017-11-29 DIAGNOSIS — H0100B Unspecified blepharitis left eye, upper and lower eyelids: Secondary | ICD-10-CM | POA: Diagnosis not present

## 2017-11-29 DIAGNOSIS — H04123 Dry eye syndrome of bilateral lacrimal glands: Secondary | ICD-10-CM | POA: Diagnosis not present

## 2017-11-29 DIAGNOSIS — H0100A Unspecified blepharitis right eye, upper and lower eyelids: Secondary | ICD-10-CM | POA: Diagnosis not present

## 2017-12-03 ENCOUNTER — Ambulatory Visit: Payer: BLUE CROSS/BLUE SHIELD | Admitting: Physical Therapy

## 2017-12-03 ENCOUNTER — Encounter: Payer: Self-pay | Admitting: Physical Therapy

## 2017-12-03 DIAGNOSIS — M25572 Pain in left ankle and joints of left foot: Secondary | ICD-10-CM

## 2017-12-03 DIAGNOSIS — M25672 Stiffness of left ankle, not elsewhere classified: Secondary | ICD-10-CM | POA: Diagnosis not present

## 2017-12-03 DIAGNOSIS — M25571 Pain in right ankle and joints of right foot: Secondary | ICD-10-CM | POA: Diagnosis not present

## 2017-12-03 DIAGNOSIS — M25671 Stiffness of right ankle, not elsewhere classified: Secondary | ICD-10-CM | POA: Diagnosis not present

## 2017-12-03 DIAGNOSIS — R262 Difficulty in walking, not elsewhere classified: Secondary | ICD-10-CM

## 2017-12-03 DIAGNOSIS — R600 Localized edema: Secondary | ICD-10-CM
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
## 2017-12-04 NOTE — Addendum Note (Signed)
Addended by: Sumner Boast on: 12/04/2017 07:54 AM   Modules accepted: Orders

## 2017-12-07 ENCOUNTER — Other Ambulatory Visit: Payer: Self-pay | Admitting: Internal Medicine

## 2017-12-11 DIAGNOSIS — G4733 Obstructive sleep apnea (adult) (pediatric): Secondary | ICD-10-CM | POA: Diagnosis not present

## 2017-12-12 NOTE — Progress Notes (Signed)
[name] [place], [place] [address] Phone: [phone] Subjective:      CC: Bilateral ankle pain  ZYS:[id number]  Tara [name] is a 67 y.o. female coming in with complaint of ankle pain. She states that she has been having ups and down with the ankle but complains of knee pain bilaterally due to all of the walking she did in [place] this past weekend.  Patient has known severe pes planus with overpronation of the hindfoot and posterior tibialis insufficiency.  Patient states overall she seems to be doing relatively well.  She wears her orthotics on a regular basis seems to be doing good.  Has been even able to walk 2 miles with some mild intermittent stopping but more secondary to shortness of breath than truly anything else.    Past Medical History:  Diagnosis Date  . Anemia   . ANKLE PAIN, LEFT 04/01/2008  . ANXIETY 04/17/2007  . Arthritis   . Chronic kidney disease   . COLONIC POLYPS, HX OF 08/01/2007  . CONTUSIONS, MULTIPLE 04/01/2009  . DEPRESSION 04/17/2007  . DIZZINESS 08/01/2007  . DYSPNEA 08/01/2007   with exertion  . Enlargement of lymph nodes 08/13/2007  . GLUCOSE INTOLERANCE 08/01/2007  . 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 HYPERLIPIDEMIA 08/01/2007  . HYPERSOMNIA 07/28/2008  . HYPERTENSION 04/17/2007  . Impaired glucose tolerance 03/23/2011  . JOINT EFFUSION, LEFT KNEE 06/02/2010  . Loose body in knee 04/01/2009  . Migraines    "stopped 3-4 yr ago" (07/23/2012)  . Morbid obesity ([place]) 04/20/2007  . OTHER DISEASES OF LUNG NOT ELSEWHERE CLASSIFIED 08/01/2007  . Pain in joint, lower leg 04/01/2009  . PERIPHERAL EDEMA 04/21/2009  . Pre-diabetes   . Sarcoidosis 09/25/2007  . SHOULDER PAIN, LEFT 04/01/2009  . Sleep apnea    does not use Cpap   Past Surgical History:  Procedure Laterality Date  . BREAST SURGERY     Biopsy benign  . CATARACT EXTRACTION    . COMBINED MEDIASTINOSCOPY AND BRONCHOSCOPY  08/2007  . COMBINED  MEDIASTINOSCOPY AND BRONCHOSCOPY  2009  . Dental implant    . FRACTURE SURGERY  ?02/1997   "left upper arm; put rod in" (07/23/2012)  . GUM SURGERY  2000-?2009   "several ORs; soft tissue graft; took material from roof of mouth" (07/23/2012  . KNEE ARTHROSCOPY  03/2004; 06/2009   "right; left" Dr. Theda Sers  . LYMPH NODE BIOPSY  ~ 2009   "for sarcoidosis; don't know exactly which nodes" (07/23/2102)  . MYOMECTOMY  1994   Open  . REFRACTIVE SURGERY  08/1998   "both eyes" (07/23/2012)  . TOTAL KNEE ARTHROPLASTY Left 09/24/2016   Procedure: TOTAL KNEE ARTHROPLASTY;  Surgeon: Dorna Leitz, MD;  Location: [facility];  Service: Orthopedics;  Laterality: Left;   Social History   Socioeconomic History  . Marital status: Widowed    Spouse name: Not on file  . Number of children: Not on file  . Years of education: Not on file  . Highest education level: Not on file  Occupational History  . Occupation: Designer, jewellery  Social Needs  . Financial resource strain: Not on file  . Food insecurity:    Worry: Not on file    Inability: Not on file  . Transportation needs:    Medical: Not on file    Non-medical: Not on file  Tobacco Use  . Smoking status: Never Smoker  . Smokeless tobacco: Never Used  Substance and Sexual Activity  .  Alcohol use: Yes    Comment: Occas  . Drug use: No  . Sexual activity: Not Currently    Birth control/protection: Post-menopausal    Comment: 1st intercourse 55 yo-1 partner  Lifestyle  . Physical activity:    Days per week: Not on file    Minutes per session: Not on file  . Stress: Not on file  Relationships  . Social connections:    Talks on phone: Not on file    Gets together: Not on file    Attends religious service: Not on file    Active member of club or organization: Not on file    Attends meetings of clubs or organizations: Not on file    Relationship status: Not on file  Other Topics Concern  . Not on file  Social History Narrative   Lives with 4 cats    Allergies  Allergen Reactions  . Fluoxetine Hcl Other (See Comments)    (PROZAC) Suicidal thoughts  . Chocolate Other (See Comments)    SOMETIMES CAUSES SEVERE HEADACHES  . Floxin [Ocuflox] Anxiety    shaky  . Other Nausea And Vomiting    INSTANT ICED TEA PACKETS  . Sulfonamide Derivatives Rash   Family History  Problem Relation Age of Onset  . Diabetes Mother   . Hypertension Mother   . Stroke Mother   . Hypertension Sister   . Asthma Sister   . Heart disease Father 64  . Hypertension Father   . Colon cancer Neg Hx      Past medical history, social, surgical and family history all reviewed in electronic medical record.  No pertanent information unless stated regarding to the chief complaint.   Review of Systems:Review of systems updated and as accurate as of 12/16/17  No headache, visual changes, nausea, vomiting, diarrhea, constipation, dizziness, abdominal pain, skin rash, fevers, chills, night sweats, weight loss, swollen lymph nodes,  chest pain, shortness of breath, mood changes.  Positive muscle aches, joint swelling, body aches  Objective  Blood pressure 122/78, pulse 65, height 5\' 6"  (1.676 m), weight 227 lb (103 kg), SpO2 99 %. Systems examined below as of 12/16/17   General: No apparent distress alert and oriented x3 mood and affect normal, dressed appropriately.  HEENT: Pupils equal, extraocular movements intact  Respiratory: Patient's speak in full sentences and does not appear short of breath  Cardiovascular: Trace lower extremity edema, non tender, no erythema  Skin: Warm dry intact with no signs of infection or rash on extremities or on axial skeleton.  Abdomen: Soft nontender  Neuro: Cranial nerves II through XII are intact, neurovascularly intact in all extremities with 2+ DTRs and 2+ pulses.  Lymph: No lymphadenopathy of posterior or anterior cervical chain or axillae bilaterally.  Gait antalgic MSK:  Non tender with full range of motion and good  stability and symmetric strength and tone of shoulders, elbows, wrist, hip, knee bilaterally.  Arthritic changes of multiple joints.  Left knee replacement  Ankle exam bilaterally does show arthritic changes.  Patient has significant swelling around the posterior tibialis tendon.  Severe overpronation of the hindfoot right greater than left.  Minimal tenderness to palpation though.  Some improvement in range of motion nontender to palpation.        Impression and Recommendations:     This case required medical decision making of moderate complexity.      Note: This dictation was prepared with Dragon dictation along with smaller phrase technology. Any transcriptional errors that result from  this process are unintentional.       ;l

## 2017-12-16 ENCOUNTER — Ambulatory Visit (INDEPENDENT_AMBULATORY_CARE_PROVIDER_SITE_OTHER): Payer: BLUE CROSS/BLUE SHIELD | Admitting: Family Medicine

## 2017-12-16 ENCOUNTER — Telehealth: Payer: Self-pay | Admitting: Internal Medicine

## 2017-12-16 ENCOUNTER — Encounter: Payer: Self-pay | Admitting: Family Medicine

## 2017-12-16 ENCOUNTER — Ambulatory Visit: Payer: BLUE CROSS/BLUE SHIELD | Admitting: Physical Therapy

## 2017-12-16 DIAGNOSIS — M76829 Posterior tibial tendinitis, unspecified leg: Secondary | ICD-10-CM | POA: Diagnosis not present

## 2017-12-16 DIAGNOSIS — G4733 Obstructive sleep apnea (adult) (pediatric): Secondary | ICD-10-CM

## 2017-12-16 NOTE — Telephone Encounter (Signed)
Thank you for talking to her.

## 2017-12-16 NOTE — Telephone Encounter (Signed)
Called and spoke to patient. Reviewed with patient CY's recommendations. Patient states she thinks that would be a good idea and remembers CY mentioning that at her last appointment. Referral placed. Nothing further needed at this time.

## 2017-12-16 NOTE — Telephone Encounter (Signed)
Patient stated she was very unhappy with her experience at the sleep clinic. Patient stated she went for a mask fitting with Lynnae Sandhoff but was not satisfied with the fit of the mast. Patient stated she could feel the air escaping to her eyes and asked him if she could try a different style of mask and she states that [place] told her no to give this one a try. Patient wants CY to know that she is unable to resume CPAP use because she already has eye issues and this mask is going to cause increased problem.   CY please advise. Thank you!

## 2017-12-16 NOTE — Telephone Encounter (Signed)
Suggest we refer her to Tara [name], DDS to consider an oral appliance for OSA

## 2017-12-16 NOTE — Patient Instructions (Signed)
Good to see you  Ice is your friend Keep doing everything for your feet and ankles Consider flonase 1 spray each nostril daily for 2 weeks See me when you need me

## 2017-12-16 NOTE — Assessment & Plan Note (Signed)
Patient does have more of a posterior tibialis insufficiency and doing relatively well with conservative therapy.  Discussed icing regimen and home exercise.  Patient will follow-up as needed

## 2017-12-16 NOTE — Telephone Encounter (Signed)
Patient is requesting more information on what she needs to do next.  CY please advise, thank you.

## 2017-12-17 DIAGNOSIS — H401113 Primary open-angle glaucoma, right eye, severe stage: Secondary | ICD-10-CM | POA: Diagnosis not present

## 2017-12-17 DIAGNOSIS — H04123 Dry eye syndrome of bilateral lacrimal glands: Secondary | ICD-10-CM | POA: Diagnosis not present

## 2017-12-17 DIAGNOSIS — H401122 Primary open-angle glaucoma, left eye, moderate stage: Secondary | ICD-10-CM | POA: Diagnosis not present

## 2017-12-26 ENCOUNTER — Other Ambulatory Visit: Payer: Self-pay | Admitting: Internal Medicine

## 2018-01-11 DIAGNOSIS — G4733 Obstructive sleep apnea (adult) (pediatric): Secondary | ICD-10-CM | POA: Diagnosis not present

## 2018-01-23 DIAGNOSIS — N183 Chronic kidney disease, stage 3 (moderate): Secondary | ICD-10-CM | POA: Diagnosis not present

## 2018-01-24 ENCOUNTER — Encounter: Payer: Self-pay | Admitting: Podiatry

## 2018-01-24 ENCOUNTER — Ambulatory Visit (INDEPENDENT_AMBULATORY_CARE_PROVIDER_SITE_OTHER): Payer: BLUE CROSS/BLUE SHIELD | Admitting: Podiatry

## 2018-01-24 DIAGNOSIS — B351 Tinea unguium: Secondary | ICD-10-CM

## 2018-01-24 DIAGNOSIS — E1151 Type 2 diabetes mellitus with diabetic peripheral angiopathy without gangrene: Secondary | ICD-10-CM

## 2018-01-24 NOTE — Progress Notes (Signed)
  Subjective:  Patient ID: Tara [name], female    DOB: 10-Jan-1951,  MRN: [id number]  Chief Complaint  Patient presents with  . Diabetic Foot Exam    exam and debridement   67 y.o. female returns for diabetic foot care. Denies new pedal concerns.  Objective:   There were no vitals filed for this visit. General AA&O x3. Normal mood and affect.  Vascular Dorsalis pedis pulses present 1+ bilaterally  Posterior tibial pulses absent bilaterally  Capillary refill normal to all digits. Pedal hair growth normal.  Neurologic Epicritic sensation present bilaterally. Protective sensation with 5.07 monofilament  present bilaterally. Vibratory sensation present bilaterally.  Dermatologic No open lesions. Interspaces clear of maceration.  Normal skin temperature and turgor. Hyperkeratotic lesions: None bilaterally. Nails: brittle, onychomycosis, thickening, elongation  Orthopedic: No history of amputation. MMT 5/5 in dorsiflexion, plantarflexion, inversion, and eversion. Normal lower extremity joint ROM without pain or crepitus.    Assessment & Plan:  Patient was evaluated and treated and all questions answered.  Diabetes with PAD, Onychomycosis -Educated on diabetic footcare. Diabetic risk level 1 -At risk foot care provided as below.  Procedure: Nail Debridement Rationale: Patient meets criteria for routine foot care due to Class B findings. Type of Debridement: manual, sharp debridement. Instrumentation: Nail nipper, rotary burr. Number of Nails: 10    No follow-ups on file.

## 2018-01-29 DIAGNOSIS — I129 Hypertensive chronic kidney disease with stage 1 through stage 4 chronic kidney disease, or unspecified chronic kidney disease: Secondary | ICD-10-CM | POA: Diagnosis not present

## 2018-01-29 DIAGNOSIS — N2581 Secondary hyperparathyroidism of renal origin: Secondary | ICD-10-CM | POA: Diagnosis not present

## 2018-01-29 DIAGNOSIS — N183 Chronic kidney disease, stage 3 (moderate): Secondary | ICD-10-CM | POA: Diagnosis not present

## 2018-01-29 DIAGNOSIS — D631 Anemia in chronic kidney disease: Secondary | ICD-10-CM | POA: Diagnosis not present

## 2018-02-03 DIAGNOSIS — Z9889 Other specified postprocedural states: Secondary | ICD-10-CM | POA: Diagnosis not present

## 2018-02-03 DIAGNOSIS — Z961 Presence of intraocular lens: Secondary | ICD-10-CM | POA: Diagnosis not present

## 2018-02-03 DIAGNOSIS — H04123 Dry eye syndrome of bilateral lacrimal glands: Secondary | ICD-10-CM | POA: Diagnosis not present

## 2018-02-03 DIAGNOSIS — H2512 Age-related nuclear cataract, left eye: Secondary | ICD-10-CM | POA: Diagnosis not present

## 2018-02-10 DIAGNOSIS — G4733 Obstructive sleep apnea (adult) (pediatric): Secondary | ICD-10-CM | POA: Diagnosis not present

## 2018-02-27 ENCOUNTER — Encounter: Payer: Self-pay | Admitting: Internal Medicine

## 2018-02-27 ENCOUNTER — Ambulatory Visit (INDEPENDENT_AMBULATORY_CARE_PROVIDER_SITE_OTHER): Payer: BLUE CROSS/BLUE SHIELD | Admitting: Internal Medicine

## 2018-02-27 DIAGNOSIS — G4733 Obstructive sleep apnea (adult) (pediatric): Secondary | ICD-10-CM

## 2018-02-27 DIAGNOSIS — R059 Cough, unspecified: Secondary | ICD-10-CM

## 2018-02-27 DIAGNOSIS — R05 Cough: Secondary | ICD-10-CM | POA: Diagnosis not present

## 2018-02-27 DIAGNOSIS — H10403 Unspecified chronic conjunctivitis, bilateral: Secondary | ICD-10-CM

## 2018-02-27 NOTE — Patient Instructions (Signed)
If  Dr Corky Sing office doesn't help with the oral appliance insurance issue, we can try redirecting you to Dr Ron Parker' office to see if he can help  [facility] to get a cool mist room humidifier for dryness in your bedroom.  Try otc Nasalcrom/ Cromol/ cromolyn nasal spray to see if it helps with cough related to post nasal drip.  Your beta-blockers Timolol and Tenormin might contribute to cough  Pay attention to reflux and heartburn, as these may also contribute to cough.

## 2018-02-27 NOTE — Assessment & Plan Note (Signed)
Active care by ophthalmology, including lacrimal duct plugs

## 2018-02-27 NOTE — Progress Notes (Signed)
HPI F never smoker followed for sarcoid, OSA/ failed CPAP, complicated by hypercalcemia, renal insufficency, glaucoma PFT 02/07/15-minimal obstructive airways disease, minimal diffusion defect, insignificant response to bronchodilator ACE in July, 2017 remained elevated 74 (8-52) NPSG  12/ 11/09 - AhI 1.4/hour I desaturation to 89%, body weight 245 lbs Unattended Home Sleep Test 03/12/17-AHI 27.6/hour, desaturation to 75%, body weight 220 pounds --------------------------------------------------------------------------  10/21/17- 67 year old female never smoker followed for Sarcoid, OSA complicated by hypercalcemia, renal insufficiency, HBP, depression, glaucoma ----OSA follow up, has not been using cpap due to dry eyes.  She describes a major problem with burning and irritated dry eyes for which she has been seeing her ophthalmologist.  She says this was why she has not been using CPAP.  She does not know how to adjust her humidifier.  We discussed alternatives.  She understands the option of an oral appliance but wants to try formal mask fitting for better seal, and adjustment of humidifier before giving up on CPAP.  02/27/2018- 67 year old female never smoker followed for Sarcoid, OSA/ failed CPAP complicated by hypercalcemia, renal insufficiency, HBP, depression, glaucoma -----OSA; Pt is not using CPAP or oral appliance-she still waiting to hear from Dr. Corky Sing office about approval for oral appliance.  Waiting on insurance determination for oral appliance with Dr Toy Cookey. Possible delay because she just went on Medicare. Could not tolerate CPAP because of dry eyes. Notes dry cough. Discussed occasional reflux, beta blockers including timolol eye drops, occasional post nasal drip.  Adderall 30 mg twice daily remains sufficient for daytime sleepiness. 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  ROS- see HPI  + = positive Constitutional:     weight loss,  no-night sweats, fevers, chills, + fatigue, lassitude. HEENT:   No-  headaches, difficulty swallowing, tooth/dental problems, sore throat,       No-  sneezing, itching, ear ache, nasal congestion, post nasal drip,  CV:  No- chest pain, no-orthopnea, PND, swelling in lower extremities, anasarca,dizziness, +palpitations  Resp: + shortness of breath with exertion or at rest.              No-   productive cough,  No non-productive cough,  No- coughing up of blood.              No- wheezing.   Skin: No-   rash or lesions. GI:  No-   heartburn, indigestion, abdominal pain, nausea, vomiting,  GU:  MS:  +  joint pain or swelling.   Neuro-     nothing unusual Psych:  No- change in mood or affect. No depression or anxiety.  No memory loss.  OBJ- Physical Exam   General- Alert, Oriented, Affect-appropriate, Distress- none acute, + overweight Skin- rash-none, lesions- none, excoriation- none,  Lymphadenopathy- none Head- atraumatic            Eyes- +continues frequent blinking             Ears- Hearing, canals-normal            Nose- Clear, no-Septal dev, mucus, polyps, erosion, perforation             Throat- Mallampati III, mucosa clear , drainage- none, tonsils- atrophic Neck- flexible , trachea midline, no stridor , thyroid nl, carotid no bruit Chest - symmetrical excursion , unlabored           Heart/CV- RRR + extra beats , no murmur , no gallop  , no rub, nl s1  s2                           - JVD- none , + elastic hose, stasis changes- none, varices- none           Lung- clear to P&A, wheeze- none, cough- none , dullness-none, rub- none           Chest wall-  Abd-  Br/ Gen/ Rectal- Not done, not indicated Extrem- cyanosis- none, clubbing, none, atrophy- none, strength- nl Neuro- +blinking, grimacing, ? Tics or frontal lobe ?

## 2018-02-27 NOTE — Assessment & Plan Note (Signed)
Persistent dry cough complaint, not evident at this visit. Discussed possible rhinitis with drip and will try nasalcrom to avoid drying antihistamines. She will wstch for reflux and wheeze. Consider role of beta blockerss

## 2018-02-27 NOTE — Assessment & Plan Note (Signed)
She couldn't tolerate CPAP because it bothered her dry eyes. If Dr Toy Cookey can't help with oral appliance insurance questions, we will refer her elsewhere.

## 2018-02-28 ENCOUNTER — Other Ambulatory Visit: Payer: Self-pay | Admitting: Family Medicine

## 2018-03-10 DIAGNOSIS — Z9889 Other specified postprocedural states: Secondary | ICD-10-CM | POA: Diagnosis not present

## 2018-03-10 DIAGNOSIS — H04123 Dry eye syndrome of bilateral lacrimal glands: Secondary | ICD-10-CM | POA: Diagnosis not present

## 2018-03-10 DIAGNOSIS — Z961 Presence of intraocular lens: Secondary | ICD-10-CM | POA: Diagnosis not present

## 2018-03-10 DIAGNOSIS — H2512 Age-related nuclear cataract, left eye: Secondary | ICD-10-CM | POA: Diagnosis not present

## 2018-03-12 DIAGNOSIS — F900 Attention-deficit hyperactivity disorder, predominantly inattentive type: Secondary | ICD-10-CM | POA: Diagnosis not present

## 2018-03-12 DIAGNOSIS — F3342 Major depressive disorder, recurrent, in full remission: Secondary | ICD-10-CM | POA: Diagnosis not present

## 2018-03-18 ENCOUNTER — Other Ambulatory Visit: Payer: Self-pay | Admitting: Internal Medicine

## 2018-03-18 DIAGNOSIS — G4733 Obstructive sleep apnea (adult) (pediatric): Secondary | ICD-10-CM

## 2018-03-21 DIAGNOSIS — H04123 Dry eye syndrome of bilateral lacrimal glands: Secondary | ICD-10-CM | POA: Diagnosis not present

## 2018-03-21 DIAGNOSIS — H1840 Unspecified corneal degeneration: Secondary | ICD-10-CM | POA: Diagnosis not present

## 2018-03-27 ENCOUNTER — Other Ambulatory Visit (INDEPENDENT_AMBULATORY_CARE_PROVIDER_SITE_OTHER): Payer: BLUE CROSS/BLUE SHIELD

## 2018-03-27 ENCOUNTER — Ambulatory Visit (INDEPENDENT_AMBULATORY_CARE_PROVIDER_SITE_OTHER): Payer: BLUE CROSS/BLUE SHIELD | Admitting: Internal Medicine

## 2018-03-27 ENCOUNTER — Encounter: Payer: Self-pay | Admitting: Internal Medicine

## 2018-03-27 VITALS — BP 124/82 | HR 61 | Temp 97.9°F | Ht 66.0 in | Wt 227.0 lb

## 2018-03-27 DIAGNOSIS — H0259 Other disorders affecting eyelid function: Secondary | ICD-10-CM

## 2018-03-27 DIAGNOSIS — E785 Hyperlipidemia, unspecified: Secondary | ICD-10-CM

## 2018-03-27 DIAGNOSIS — E099 Drug or chemical induced diabetes mellitus without complications: Secondary | ICD-10-CM | POA: Diagnosis not present

## 2018-03-27 DIAGNOSIS — T380X5A Adverse effect of glucocorticoids and synthetic analogues, initial encounter: Secondary | ICD-10-CM | POA: Diagnosis not present

## 2018-03-27 DIAGNOSIS — Z23 Encounter for immunization: Secondary | ICD-10-CM

## 2018-03-27 DIAGNOSIS — N184 Chronic kidney disease, stage 4 (severe): Secondary | ICD-10-CM

## 2018-03-27 DIAGNOSIS — I10 Essential (primary) hypertension: Secondary | ICD-10-CM

## 2018-03-27 DIAGNOSIS — H5713 Ocular pain, bilateral: Secondary | ICD-10-CM | POA: Insufficient documentation

## 2018-03-27 LAB — HEMOGLOBIN A1C: HEMOGLOBIN A1C: 6.2 % (ref 4.6–6.5)

## 2018-03-27 NOTE — Assessment & Plan Note (Signed)
With excessive blinking - for optho referral [facility]

## 2018-03-27 NOTE — Assessment & Plan Note (Signed)
stable overall by history and exam, recent data reviewed with pt, and pt to continue medical treatment as before,  to f/u any worsening symptoms or concerns  

## 2018-03-27 NOTE — Patient Instructions (Signed)
You had the flu shot today  Please continue all other medications as before, and refills have been done if requested.  Please have the pharmacy call with any other refills you may need.  Please continue your efforts at being more active, low cholesterol diet, and weight control.  You are otherwise up to date with prevention measures today.  Please keep your appointments with your specialists as you may have planned  You will be contacted regarding the referral for: [facility] if [place] with your insurance  Please go to the LAB in the Basement (turn left off the elevator) for the tests to be done today  You will be contacted by phone if any changes need to be made immediately.  Otherwise, you will receive a letter about your results with an explanation, but please check with MyChart first.  Please remember to sign up for MyChart if you have not done so, as this will be important to you in the future with finding out test results, communicating by private email, and scheduling acute appointments online when needed.

## 2018-03-27 NOTE — Progress Notes (Addendum)
Subjective:    Patient ID: Tara [name], female    DOB: 1951-04-03, 67 y.o.   MRN: [id number]  HPI  Here to f/u; overall doing ok,  Pt denies chest pain, increasing sob or doe, wheezing, orthopnea, PND, increased LE swelling, palpitations, dizziness or syncope.  Pt denies new neurological symptoms such as new headache, or facial or extremity weakness or numbness.  Pt denies polydipsia, polyuria, or low sugar episode.  Pt states overall good compliance with meds, mostly trying to follow appropriate diet, with wt overall stable,  but little exercise however.  Has had no luck with effective tx for dx of dry eyes that leads to eye pain and frequent blinking.  Last optho was at [facility].  She has relatives in [place] and is ok with [place] clinic referral.   Past Medical History:  Diagnosis Date  . Anemia   . ANKLE PAIN, LEFT 04/01/2008  . ANXIETY 04/17/2007  . Arthritis   . Chronic kidney disease   . COLONIC POLYPS, HX OF 08/01/2007  . CONTUSIONS, MULTIPLE 04/01/2009  . DEPRESSION 04/17/2007  . DIZZINESS 08/01/2007  . DYSPNEA 08/01/2007   with exertion  . Enlargement of lymph nodes 08/13/2007  . GLUCOSE INTOLERANCE 08/01/2007  . 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 HYPERLIPIDEMIA 08/01/2007  . HYPERSOMNIA 07/28/2008  . HYPERTENSION 04/17/2007  . Impaired glucose tolerance 03/23/2011  . JOINT EFFUSION, LEFT KNEE 06/02/2010  . Loose body in knee 04/01/2009  . Migraines    "stopped 3-4 yr ago" (07/23/2012)  . Morbid obesity ([place]) 04/20/2007  . OTHER DISEASES OF LUNG NOT ELSEWHERE CLASSIFIED 08/01/2007  . Pain in joint, lower leg 04/01/2009  . PERIPHERAL EDEMA 04/21/2009  . Pre-diabetes   . Sarcoidosis 09/25/2007  . SHOULDER PAIN, LEFT 04/01/2009  . Sleep apnea    does not use Cpap   Past Surgical History:  Procedure Laterality Date  . BREAST SURGERY     Biopsy benign  . CATARACT EXTRACTION    . COMBINED MEDIASTINOSCOPY AND BRONCHOSCOPY  08/2007  . COMBINED MEDIASTINOSCOPY AND  BRONCHOSCOPY  2009  . Dental implant    . FRACTURE SURGERY  ?02/1997   "left upper arm; put rod in" (07/23/2012)  . GUM SURGERY  2000-?2009   "several ORs; soft tissue graft; took material from roof of mouth" (07/23/2012  . KNEE ARTHROSCOPY  03/2004; 06/2009   "right; left" Dr. Theda Sers  . LYMPH NODE BIOPSY  ~ 2009   "for sarcoidosis; don't know exactly which nodes" (07/23/2102)  . MYOMECTOMY  1994   Open  . REFRACTIVE SURGERY  08/1998   "both eyes" (07/23/2012)  . TOTAL KNEE ARTHROPLASTY Left 09/24/2016   Procedure: TOTAL KNEE ARTHROPLASTY;  Surgeon: Dorna Leitz, MD;  Location: [facility];  Service: Orthopedics;  Laterality: Left;    reports that she has never smoked. She has never used smokeless tobacco. She reports that she drinks alcohol. She reports that she does not use drugs. family history includes Asthma in her sister; Diabetes in her mother; Heart disease (age of onset: 36) in her father; Hypertension in her father, mother, and sister; Stroke in her mother. Allergies  Allergen Reactions  . Fluoxetine Hcl Other (See Comments)    (PROZAC) Suicidal thoughts  . Chocolate Other (See Comments)    SOMETIMES CAUSES SEVERE HEADACHES  . Floxin [Ocuflox] Anxiety    shaky  . Other Nausea And Vomiting    INSTANT ICED TEA PACKETS  . Sulfonamide Derivatives Rash   Current Outpatient Medications on  File Prior to Visit  Medication Sig Dispense Refill  . allopurinol (ZYLOPRIM) 100 MG tablet TAKE 1 TABLET(100 MG) BY MOUTH DAILY 30 tablet 0  . amphetamine-dextroamphetamine (ADDERALL) 30 MG tablet Take 30 mg by mouth 2 (two) times daily.    Marland Kitchen antiseptic oral rinse (BIOTENE) LIQD 15 mLs by Mouth Rinse route 2 (two) times daily as needed for dry mouth.    Marland Kitchen aspirin EC 81 MG tablet Take 81 mg by mouth daily.    Marland Kitchen atenolol (TENORMIN) 50 MG tablet TAKE 1 TABLET(50 MG) BY MOUTH DAILY 90 tablet 3  . cloNIDine (CATAPRES) 0.1 MG tablet TAKE 1 TABLET(0.1 MG) BY MOUTH TWICE DAILY 180 tablet 0  . clorazepate  (TRANXENE) 7.5 MG tablet Take 7.5 mg by mouth 3 (three) times daily.    Marland Kitchen desvenlafaxine (PRISTIQ) 100 MG 24 hr tablet Take 100 mg by mouth at bedtime.    Marland Kitchen DILT-XR 240 MG 24 hr capsule TAKE 1 CAPSULE(240 MG) BY MOUTH DAILY 90 capsule 0  . ferrous sulfate 325 (65 FE) MG tablet Take 325 mg by mouth daily with breakfast.    . furosemide (LASIX) 40 MG tablet Take 1 tablet (40 mg total) by mouth 2 (two) times daily. (Patient taking differently: Take 40 mg by mouth daily. ) 30 tablet 0  . hydrALAZINE (APRESOLINE) 100 MG tablet Take 1 tablet (100 mg total) by mouth every 8 (eight) hours. (Patient taking differently: Take 100 mg by mouth 3 (three) times daily. )    . latanoprost (XALATAN) 0.005 % ophthalmic solution Place 1 drop into both eyes at bedtime.    . Melatonin 5 MG TABS Take 10 mg by mouth at bedtime.    . Multiple Vitamins-Minerals (MULTIVITAMIN WITH MINERALS) tablet Take 1 tablet by mouth daily.    . Omega 3 1200 MG CAPS Take 2,400 mg by mouth at bedtime.    . timolol (TIMOPTIC) 0.5 % ophthalmic solution Place 1 drop into both eyes 2 (two) times daily.  0  . zaleplon (SONATA) 10 MG capsule Take 10 mg by mouth at bedtime as needed for sleep.     No current facility-administered medications on file prior to visit.    Review of Systems  Constitutional: Negative for other unusual diaphoresis or sweats HENT: Negative for ear discharge or swelling Eyes: Negative for other worsening visual disturbances Respiratory: Negative for stridor or other swelling  Gastrointestinal: Negative for worsening distension or other blood Genitourinary: Negative for retention or other urinary change Musculoskeletal: Negative for other MSK pain or swelling Skin: Negative for color change or other new lesions Neurological: Negative for worsening tremors and other numbness  Psychiatric/Behavioral: Negative for worsening agitation or other fatigue All other system neg per pt    Objective:   Physical Exam BP  124/82   Pulse 61   Temp 97.9 F (36.6 C) (Oral)   Ht 5\' 6"  (1.676 m)   Wt 227 lb (103 kg)   SpO2 96%   BMI 36.64 kg/m  VS noted,  Constitutional: Pt appears in NAD HENT: Head: NCAT.  Right Ear: External ear normal.  Left Ear: External ear normal.  Eyes: . Pupils are equal, round, and reactive to light. Conjunctivae and EOM are normal Nose: without d/c or deformity bilat eyes with irritation and very frequent blinking and some tearing Neck: Neck supple. Gross normal ROM Cardiovascular: Normal rate and regular rhythm.   Pulmonary/Chest: Effort normal and breath sounds without rales or wheezing.  Abd:  Soft, NT, ND, +  BS, no organomegaly Neurological: Pt is alert. At baseline orientation, motor grossly intact Skin: Skin is warm. No rashes, other new lesions, no LE edema Psychiatric: Pt behavior is normal without agitation  No other exam findings Lab Results  Component Value Date   WBC 6.1 09/24/2017   HGB 13.2 09/24/2017   HCT 39.5 09/24/2017   PLT 223.0 09/24/2017   GLUCOSE 111 (H) 09/24/2017   CHOL 161 09/24/2017   TRIG 232.0 (H) 09/24/2017   HDL 51.30 09/24/2017   LDLDIRECT 50.0 09/24/2017   LDLCALC 78 03/29/2016   ALT 31 09/24/2017   AST 24 09/24/2017   NA 136 09/24/2017   K 3.8 09/24/2017   CL 96 09/24/2017   CREATININE 1.27 (H) 09/24/2017   BUN 29 (H) 09/24/2017   CO2 33 (H) 09/24/2017   TSH 2.74 09/24/2017   INR 0.98 09/17/2016   HGBA1C 6.0 09/24/2017   MICROALBUR <0.7 09/24/2017       Assessment & Plan:

## 2018-03-28 ENCOUNTER — Other Ambulatory Visit: Payer: Self-pay | Admitting: Family Medicine

## 2018-03-28 LAB — LIPID PANEL
CHOL/HDL RATIO: 3
CHOLESTEROL: 145 mg/dL (ref 0–200)
HDL: 47.7 mg/dL (ref >39.00)
LDL CALC: 60 mg/dL (ref 0–99)
NonHDL: 97.62
Triglycerides: 186 mg/dL — ABNORMAL HIGH (ref 0.0–149.0)
VLDL: 37.2 mg/dL (ref 0.0–40.0)

## 2018-03-28 LAB — BASIC METABOLIC PANEL
BUN: 25 mg/dL — AB (ref 6–23)
CHLORIDE: 96 meq/L (ref 96–112)
CO2: 28 mEq/L (ref 19–32)
CREATININE: 1.24 mg/dL — AB (ref 0.40–1.20)
Calcium: 10.3 mg/dL (ref 8.4–10.5)
GFR: 46.09 mL/min — ABNORMAL LOW (ref >60.00)
Glucose, Bld: 98 mg/dL (ref 70–99)
Potassium: 4 mEq/L (ref 3.5–5.1)
Sodium: 136 mEq/L (ref 135–145)

## 2018-03-28 LAB — HEPATIC FUNCTION PANEL
ALK PHOS: 117 U/L (ref 39–117)
ALT: 30 U/L (ref 0–35)
AST: 24 U/L (ref 0–37)
Albumin: 4.4 g/dL (ref 3.5–5.2)
BILIRUBIN DIRECT: 0.1 mg/dL (ref 0.0–0.3)
TOTAL PROTEIN: 7.6 g/dL (ref 6.0–8.3)
Total Bilirubin: 0.4 mg/dL (ref 0.2–1.2)

## 2018-04-01 DIAGNOSIS — G4733 Obstructive sleep apnea (adult) (pediatric): Secondary | ICD-10-CM | POA: Diagnosis not present

## 2018-04-09 ENCOUNTER — Other Ambulatory Visit: Payer: Self-pay | Admitting: Internal Medicine

## 2018-04-09 MED ORDER — DILTIAZEM HCL ER 240 MG PO CP24: 240.0000 mg | ORAL_CAPSULE | Freq: Every day | ORAL | 1 refills | Status: DC

## 2018-04-20 ENCOUNTER — Other Ambulatory Visit: Payer: Self-pay | Admitting: Internal Medicine

## 2018-04-21 MED ORDER — CLONIDINE HCL 0.1 MG PO TABS: 0.1000 mg | ORAL_TABLET | Freq: Two times a day (BID) | ORAL | 1 refills | Status: DC

## 2018-04-25 ENCOUNTER — Ambulatory Visit (INDEPENDENT_AMBULATORY_CARE_PROVIDER_SITE_OTHER): Payer: BLUE CROSS/BLUE SHIELD | Admitting: Podiatry

## 2018-04-25 DIAGNOSIS — E1151 Type 2 diabetes mellitus with diabetic peripheral angiopathy without gangrene: Secondary | ICD-10-CM | POA: Diagnosis not present

## 2018-04-25 DIAGNOSIS — B351 Tinea unguium: Secondary | ICD-10-CM | POA: Diagnosis not present

## 2018-04-25 NOTE — Progress Notes (Signed)
Subjective:  Patient ID: Tara [name], female    DOB: [birth year],  MRN: [id number]  Chief Complaint  Patient presents with  . Nail Problem    pt is here for diabetic nail care. no other comments or concerns.    67 y.o. female presents  for diabetic foot care. Last AMBS was unknown. Using tea tree oil on her nails noticing improvement. Denies numbness and tingling in their feet. Denies cramping in legs and thighs.  Review of Systems: Negative except as noted in the HPI. Denies N/V/F/Ch.  Past Medical History:  Diagnosis Date  . Anemia   . ANKLE PAIN, LEFT 04/01/2008  . ANXIETY 04/17/2007  . Arthritis   . Chronic kidney disease   . COLONIC POLYPS, HX OF 08/01/2007  . CONTUSIONS, MULTIPLE 04/01/2009  . DEPRESSION 04/17/2007  . DIZZINESS 08/01/2007  . DYSPNEA 08/01/2007   with exertion  . Enlargement of lymph nodes 08/13/2007  . GLUCOSE INTOLERANCE 08/01/2007  . 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 HYPERLIPIDEMIA 08/01/2007  . HYPERSOMNIA 07/28/2008  . HYPERTENSION 04/17/2007  . Impaired glucose tolerance 03/23/2011  . JOINT EFFUSION, LEFT KNEE 06/02/2010  . Loose body in knee 04/01/2009  . Migraines    "stopped 3-4 yr ago" (07/23/2012)  . Morbid obesity ([place]) 04/20/2007  . OTHER DISEASES OF LUNG NOT ELSEWHERE CLASSIFIED 08/01/2007  . Pain in joint, lower leg 04/01/2009  . PERIPHERAL EDEMA 04/21/2009  . Pre-diabetes   . Sarcoidosis 09/25/2007  . SHOULDER PAIN, LEFT 04/01/2009  . Sleep apnea    does not use Cpap    Current Outpatient Medications:  .  allopurinol (ZYLOPRIM) 100 MG tablet, TAKE 1 TABLET(100 MG) BY MOUTH DAILY, Disp: 30 tablet, Rfl: 0 .  amphetamine-dextroamphetamine (ADDERALL) 30 MG tablet, Take 30 mg by mouth 2 (two) times daily., Disp: , Rfl:  .  antiseptic oral rinse (BIOTENE) LIQD, 15 mLs by Mouth Rinse route 2 (two) times daily as needed for dry mouth., Disp: , Rfl:  .  aspirin EC 81 MG tablet, Take 81 mg by mouth daily., Disp: , Rfl:  .  atenolol (TENORMIN) 50 MG  tablet, TAKE 1 TABLET(50 MG) BY MOUTH DAILY, Disp: 90 tablet, Rfl: 3 .  cloNIDine (CATAPRES) 0.1 MG tablet, Take 1 tablet (0.1 mg total) by mouth 2 (two) times daily., Disp: 180 tablet, Rfl: 1 .  clorazepate (TRANXENE) 7.5 MG tablet, Take 7.5 mg by mouth 3 (three) times daily., Disp: , Rfl:  .  desvenlafaxine (PRISTIQ) 100 MG 24 hr tablet, Take 100 mg by mouth at bedtime., Disp: , Rfl:  .  diltiazem (DILT-XR) 240 MG 24 hr capsule, Take 1 capsule (240 mg total) by mouth daily., Disp: 90 capsule, Rfl: 1 .  ferrous sulfate 325 (65 FE) MG tablet, Take 325 mg by mouth daily with breakfast., Disp: , Rfl:  .  furosemide (LASIX) 40 MG tablet, Take 1 tablet (40 mg total) by mouth 2 (two) times daily. (Patient taking differently: Take 40 mg by mouth daily. ), Disp: 30 tablet, Rfl: 0 .  hydrALAZINE (APRESOLINE) 100 MG tablet, Take 1 tablet (100 mg total) by mouth every 8 (eight) hours. (Patient taking differently: Take 100 mg by mouth 3 (three) times daily. ), Disp: , Rfl:  .  latanoprost (XALATAN) 0.005 % ophthalmic solution, Place 1 drop into both eyes at bedtime., Disp: , Rfl:  .  Melatonin 5 MG TABS, Take 10 mg by mouth at bedtime., Disp: , Rfl:  .  Multiple Vitamins-Minerals (MULTIVITAMIN WITH MINERALS) tablet, Take  1 tablet by mouth daily., Disp: , Rfl:  .  Omega 3 1200 MG CAPS, Take 2,400 mg by mouth at bedtime., Disp: , Rfl:  .  timolol (TIMOPTIC) 0.5 % ophthalmic solution, Place 1 drop into both eyes 2 (two) times daily., Disp: , Rfl: 0 .  zaleplon (SONATA) 10 MG capsule, Take 10 mg by mouth at bedtime as needed for sleep., Disp: , Rfl:   Social History   Tobacco Use  Smoking Status Never Smoker  Smokeless Tobacco Never Used    Allergies  Allergen Reactions  . Fluoxetine Hcl Other (See Comments)    (PROZAC) Suicidal thoughts  . Chocolate Other (See Comments)    SOMETIMES CAUSES SEVERE HEADACHES  . Floxin [Ocuflox] Anxiety    shaky  . Other Nausea And Vomiting    INSTANT ICED TEA PACKETS    . Sulfonamide Derivatives Rash   Objective:  There were no vitals filed for this visit. There is no height or weight on file to calculate BMI. Constitutional Well developed. Well nourished.  Vascular Dorsalis pedis pulses present 1+ bilaterally  Posterior tibial pulses absent bilaterally  Pedal hair growth absent. Capillary refill normal to all digits.  No cyanosis or clubbing noted.  Neurologic Normal speech. Oriented to person, place, and time. Epicritic sensation to light touch grossly present bilaterally. Protective sensation with 5.07 monofilament  present bilaterally. Vibratory sensation present bilaterally.  Dermatologic Nails elongated, thickened, dystrophic. No open wounds. No skin lesions.  Orthopedic: Normal joint ROM without pain or crepitus bilaterally. No visible deformities. No bony tenderness.   Assessment:   1. Diabetes mellitus type 2 with peripheral artery disease ([place])   2. Onychomycosis    Plan:  Patient was evaluated and treated and all questions answered.  Diabetes with PAD, Onychomycosis -Educated on diabetic footcare. Diabetic risk level 1 -Nails x10 debrided sharply and manually with large nail nipper and rotary burr.   Procedure: Nail Debridement Rationale: Patient meets criteria for routine foot care due to PAD Type of Debridement: manual, sharp debridement. Instrumentation: Nail nipper, rotary burr. Number of Nails: 10  Return in about 3 months (around 07/26/2018) for Diabetic Foot Care.

## 2018-04-26 ENCOUNTER — Other Ambulatory Visit: Payer: Self-pay | Admitting: Family Medicine

## 2018-04-28 NOTE — Telephone Encounter (Signed)
Refill done.  

## 2018-05-13 ENCOUNTER — Encounter: Payer: Self-pay | Admitting: Gynecology

## 2018-05-13 ENCOUNTER — Ambulatory Visit (INDEPENDENT_AMBULATORY_CARE_PROVIDER_SITE_OTHER): Payer: BLUE CROSS/BLUE SHIELD | Admitting: Gynecology

## 2018-05-13 VITALS — BP 118/76 | Ht 66.0 in | Wt 230.0 lb

## 2018-05-13 DIAGNOSIS — Z01419 Encounter for gynecological examination (general) (routine) without abnormal findings: Secondary | ICD-10-CM | POA: Diagnosis not present

## 2018-05-13 DIAGNOSIS — N952 Postmenopausal atrophic vaginitis: Secondary | ICD-10-CM | POA: Diagnosis not present

## 2018-05-13 NOTE — Patient Instructions (Addendum)
A lot for bone density as scheduled  Follow-up in 1 year for annual exam, sooner as needed.

## 2018-05-13 NOTE — Progress Notes (Signed)
    Tara [name] 10-24-1950 [id number]        67 y.o.  G0P0 for annual gynecologic exam.  Without gynecologic complaints  Past medical history,surgical history, problem list, medications, allergies, family history and social history were all reviewed and documented as reviewed in the EPIC chart.  ROS:  Performed with pertinent positives and negatives included in the history, assessment and plan.   Additional significant findings : None   Exam: [name] assistant Vitals:   05/13/18 1631  BP: 118/76  Weight: 230 lb (104.3 kg)  Height: 5\' 6"  (1.676 m)   Body mass index is 37.12 kg/m.  General appearance:  Normal affect, orientation and appearance. Skin: Grossly normal HEENT: Without gross lesions.  No cervical or supraclavicular adenopathy. Thyroid normal.  Lungs:  Clear without wheezing, rales or rhonchi Cardiac: RR, without RMG Abdominal:  Soft, nontender, without masses, guarding, rebound, organomegaly or hernia Breasts:  Examined lying and sitting without masses, retractions, discharge or axillary adenopathy. Pelvic:  Ext, BUS, Vagina: Normal with atrophic changes  Cervix: High in vaginal vault.  Difficult to visualize.  Uterus: Difficult to palpate but no gross masses or tenderness.  Adnexa: Without masses or tenderness    Anus and perineum: Normal   Rectovaginal: Normal sphincter tone without palpated masses or tenderness.    Assessment/Plan:  67 y.o. G0P0 female for annual gynecologic exam.   1. Postmenopausal/atrophic genital changes.  No significant menopausal symptoms or any vaginal bleeding. 2. Pap smear/HPV 2018.  No Pap smear done today.  No history of abnormal Pap smears previously. 3. Colonoscopy 2014.  Repeat at their recommended interval. 4. Mammography 2017.  Reminded patient she is overdue and she agrees to call and schedule.  Breast exam normal today. 5. DEXA 2014 normal.  Recommend follow-up DEXA now and patient will schedule. 6. Health maintenance.   No routine lab work done as patient does this elsewhere.  Follow-up 1 year, sooner as needed.   Anastasio Auerbach MD, 4:53 PM 05/13/2018

## 2018-05-14 DIAGNOSIS — H04123 Dry eye syndrome of bilateral lacrimal glands: Secondary | ICD-10-CM | POA: Diagnosis not present

## 2018-05-14 DIAGNOSIS — Z961 Presence of intraocular lens: Secondary | ICD-10-CM | POA: Diagnosis not present

## 2018-05-14 DIAGNOSIS — Z9889 Other specified postprocedural states: Secondary | ICD-10-CM | POA: Diagnosis not present

## 2018-05-14 DIAGNOSIS — H2512 Age-related nuclear cataract, left eye: Secondary | ICD-10-CM | POA: Diagnosis not present

## 2018-06-09 ENCOUNTER — Telehealth: Payer: Self-pay | Admitting: Internal Medicine

## 2018-06-09 DIAGNOSIS — G4733 Obstructive sleep apnea (adult) (pediatric): Secondary | ICD-10-CM

## 2018-06-09 NOTE — Telephone Encounter (Signed)
Called patient unable to reach left message to give us a call back.

## 2018-06-10 ENCOUNTER — Other Ambulatory Visit: Payer: Self-pay | Admitting: Gynecology

## 2018-06-10 ENCOUNTER — Encounter: Payer: Self-pay | Admitting: Gynecology

## 2018-06-10 ENCOUNTER — Ambulatory Visit (INDEPENDENT_AMBULATORY_CARE_PROVIDER_SITE_OTHER): Payer: BLUE CROSS/BLUE SHIELD

## 2018-06-10 DIAGNOSIS — Z780 Asymptomatic menopausal state: Secondary | ICD-10-CM | POA: Diagnosis not present

## 2018-06-10 DIAGNOSIS — Z01419 Encounter for gynecological examination (general) (routine) without abnormal findings: Secondary | ICD-10-CM

## 2018-06-10 NOTE — Telephone Encounter (Signed)
Patient is aware of recommendations and would like to go with referral to ENT nothing further needed at this time.

## 2018-06-10 NOTE — Telephone Encounter (Signed)
Called and spoke with pt letting her know that we did receive a call from [place] with [employer] stating that pt did pick up her CPAP and also that pt was still having complaints of severe dry eye. Pt has been to 4 different ophthalmologists and has been told that there is nothing they can do.  Pt takes both timolol ophthalmic solution as well as latanoprost ophthalmic solution daily but also takes Retaine (OTC eyed drop) daily but has had no relief with her dry eyes even after all these eye meds.  Dr. Annamaria Boots, please advise if you have any recommendations for pt or if pt needs to check with PCP in regards to this to get their recs. Thanks!  Allergies  Allergen Reactions  . Fluoxetine Hcl Other (See Comments)    (PROZAC) Suicidal thoughts  . Chocolate Other (See Comments)    SOMETIMES CAUSES SEVERE HEADACHES  . Floxin [Ocuflox] Anxiety    shaky  . Other Nausea And Vomiting    INSTANT ICED TEA PACKETS  . Sulfonamide Derivatives Rash     Current Outpatient Medications:  .  allopurinol (ZYLOPRIM) 100 MG tablet, TAKE 1 TABLET(100 MG) BY MOUTH DAILY, Disp: 90 tablet, Rfl: 1 .  amphetamine-dextroamphetamine (ADDERALL) 30 MG tablet, Take 30 mg by mouth 2 (two) times daily., Disp: , Rfl:  .  antiseptic oral rinse (BIOTENE) LIQD, 15 mLs by Mouth Rinse route 2 (two) times daily as needed for dry mouth., Disp: , Rfl:  .  aspirin EC 81 MG tablet, Take 81 mg by mouth daily., Disp: , Rfl:  .  atenolol (TENORMIN) 50 MG tablet, TAKE 1 TABLET(50 MG) BY MOUTH DAILY, Disp: 90 tablet, Rfl: 3 .  cloNIDine (CATAPRES) 0.1 MG tablet, Take 1 tablet (0.1 mg total) by mouth 2 (two) times daily., Disp: 180 tablet, Rfl: 1 .  clorazepate (TRANXENE) 7.5 MG tablet, Take 7.5 mg by mouth 3 (three) times daily., Disp: , Rfl:  .  desvenlafaxine (PRISTIQ) 100 MG 24 hr tablet, Take 100 mg by mouth at bedtime., Disp: , Rfl:  .  diltiazem (DILT-XR) 240 MG 24 hr capsule, Take 1 capsule (240 mg total) by mouth daily., Disp: 90 capsule,  Rfl: 1 .  ferrous sulfate 325 (65 FE) MG tablet, Take 325 mg by mouth daily with breakfast., Disp: , Rfl:  .  furosemide (LASIX) 40 MG tablet, Take 1 tablet (40 mg total) by mouth 2 (two) times daily. (Patient taking differently: Take 40 mg by mouth daily. ), Disp: 30 tablet, Rfl: 0 .  hydrALAZINE (APRESOLINE) 100 MG tablet, Take 1 tablet (100 mg total) by mouth every 8 (eight) hours. (Patient taking differently: Take 100 mg by mouth 3 (three) times daily. ), Disp: , Rfl:  .  latanoprost (XALATAN) 0.005 % ophthalmic solution, Place 1 drop into both eyes at bedtime., Disp: , Rfl:  .  Melatonin 5 MG TABS, Take 10 mg by mouth at bedtime., Disp: , Rfl:  .  Multiple Vitamins-Minerals (MULTIVITAMIN WITH MINERALS) tablet, Take 1 tablet by mouth daily., Disp: , Rfl:  .  Omega 3 1200 MG CAPS, Take 2,400 mg by mouth at bedtime., Disp: , Rfl:  .  timolol (TIMOPTIC) 0.5 % ophthalmic solution, Place 1 drop into both eyes 2 (two) times daily., Disp: , Rfl: 0 .  zaleplon (SONATA) 10 MG capsule, Take 10 mg by mouth at bedtime as needed for sleep., Disp: , Rfl:

## 2018-06-10 NOTE — Telephone Encounter (Signed)
If she can't tolerate CPAP, and if she found insurance won't cover an oral appliance (she had seen Dr Toy Cookey for this), then offer referral to ENT Dr Melida Quitter to learn about surgical options for obstructive sleep apnea.

## 2018-06-25 DIAGNOSIS — H2512 Age-related nuclear cataract, left eye: Secondary | ICD-10-CM | POA: Diagnosis not present

## 2018-06-25 DIAGNOSIS — H401122 Primary open-angle glaucoma, left eye, moderate stage: Secondary | ICD-10-CM | POA: Diagnosis not present

## 2018-06-25 DIAGNOSIS — H524 Presbyopia: Secondary | ICD-10-CM | POA: Diagnosis not present

## 2018-06-25 DIAGNOSIS — H401113 Primary open-angle glaucoma, right eye, severe stage: Secondary | ICD-10-CM | POA: Diagnosis not present

## 2018-06-26 DIAGNOSIS — G4733 Obstructive sleep apnea (adult) (pediatric): Secondary | ICD-10-CM | POA: Diagnosis not present

## 2018-07-07 DIAGNOSIS — H2512 Age-related nuclear cataract, left eye: Secondary | ICD-10-CM | POA: Diagnosis not present

## 2018-07-07 DIAGNOSIS — Z961 Presence of intraocular lens: Secondary | ICD-10-CM | POA: Diagnosis not present

## 2018-07-07 DIAGNOSIS — H04123 Dry eye syndrome of bilateral lacrimal glands: Secondary | ICD-10-CM | POA: Diagnosis not present

## 2018-07-07 DIAGNOSIS — Z9889 Other specified postprocedural states: Secondary | ICD-10-CM | POA: Diagnosis not present

## 2018-07-16 HISTORY — PX: POLYPECTOMY: SHX149

## 2018-07-16 HISTORY — PX: COLONOSCOPY: SHX174

## 2018-07-20 ENCOUNTER — Encounter: Payer: Self-pay | Admitting: Gastroenterology

## 2018-07-25 ENCOUNTER — Ambulatory Visit (INDEPENDENT_AMBULATORY_CARE_PROVIDER_SITE_OTHER): Payer: Commercial Managed Care - PPO | Admitting: Podiatry

## 2018-07-25 DIAGNOSIS — E1151 Type 2 diabetes mellitus with diabetic peripheral angiopathy without gangrene: Secondary | ICD-10-CM

## 2018-07-25 DIAGNOSIS — B351 Tinea unguium: Secondary | ICD-10-CM | POA: Diagnosis not present

## 2018-07-25 NOTE — Progress Notes (Signed)
Subjective:  Patient ID: Tara [name], female    DOB: 05/04/1951,  MRN: [id number]  Chief Complaint  Patient presents with  . Diabetes    3 month diabetic foot care/debride    68 y.o. female presents  for diabetic foot care. Denies new issues. Denies numbness and tingling in their feet. Denies cramping in legs and thighs.  Review of Systems: Negative except as noted in the HPI. Denies N/V/F/Ch.  Past Medical History:  Diagnosis Date  . Anemia   . ANKLE PAIN, LEFT 04/01/2008  . ANXIETY 04/17/2007  . Arthritis   . Chronic kidney disease   . COLONIC POLYPS, HX OF 08/01/2007  . CONTUSIONS, MULTIPLE 04/01/2009  . DEPRESSION 04/17/2007  . DIZZINESS 08/01/2007  . DYSPNEA 08/01/2007   with exertion  . Enlargement of lymph nodes 08/13/2007  . GLUCOSE INTOLERANCE 08/01/2007  . 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 HYPERLIPIDEMIA 08/01/2007  . HYPERSOMNIA 07/28/2008  . HYPERTENSION 04/17/2007  . Impaired glucose tolerance 03/23/2011  . JOINT EFFUSION, LEFT KNEE 06/02/2010  . Loose body in knee 04/01/2009  . Migraines    "stopped 3-4 yr ago" (07/23/2012)  . Morbid obesity ([place]) 04/20/2007  . OTHER DISEASES OF LUNG NOT ELSEWHERE CLASSIFIED 08/01/2007  . Pain in joint, lower leg 04/01/2009  . PERIPHERAL EDEMA 04/21/2009  . Pre-diabetes   . Sarcoidosis 09/25/2007  . SHOULDER PAIN, LEFT 04/01/2009  . Sleep apnea    does not use Cpap    Current Outpatient Medications:  .  allopurinol (ZYLOPRIM) 100 MG tablet, TAKE 1 TABLET(100 MG) BY MOUTH DAILY, Disp: 90 tablet, Rfl: 1 .  amphetamine-dextroamphetamine (ADDERALL) 30 MG tablet, Take 30 mg by mouth 2 (two) times daily., Disp: , Rfl:  .  antiseptic oral rinse (BIOTENE) LIQD, 15 mLs by Mouth Rinse route 2 (two) times daily as needed for dry mouth., Disp: , Rfl:  .  aspirin EC 81 MG tablet, Take 81 mg by mouth daily., Disp: , Rfl:  .  atenolol (TENORMIN) 50 MG tablet, TAKE 1 TABLET(50 MG) BY MOUTH DAILY, Disp: 90 tablet, Rfl: 3 .  cloNIDine  (CATAPRES) 0.1 MG tablet, Take 1 tablet (0.1 mg total) by mouth 2 (two) times daily., Disp: 180 tablet, Rfl: 1 .  clorazepate (TRANXENE) 7.5 MG tablet, Take 7.5 mg by mouth 3 (three) times daily., Disp: , Rfl:  .  desvenlafaxine (PRISTIQ) 100 MG 24 hr tablet, Take 100 mg by mouth at bedtime., Disp: , Rfl:  .  diltiazem (DILT-XR) 240 MG 24 hr capsule, Take 1 capsule (240 mg total) by mouth daily., Disp: 90 capsule, Rfl: 1 .  ferrous sulfate 325 (65 FE) MG tablet, Take 325 mg by mouth daily with breakfast., Disp: , Rfl:  .  furosemide (LASIX) 40 MG tablet, Take 1 tablet (40 mg total) by mouth 2 (two) times daily. (Patient taking differently: Take 40 mg by mouth daily. ), Disp: 30 tablet, Rfl: 0 .  hydrALAZINE (APRESOLINE) 100 MG tablet, Take 1 tablet (100 mg total) by mouth every 8 (eight) hours. (Patient taking differently: Take 100 mg by mouth 3 (three) times daily. ), Disp: , Rfl:  .  latanoprost (XALATAN) 0.005 % ophthalmic solution, Place 1 drop into both eyes at bedtime., Disp: , Rfl:  .  Melatonin 5 MG TABS, Take 10 mg by mouth at bedtime., Disp: , Rfl:  .  Multiple Vitamins-Minerals (MULTIVITAMIN WITH MINERALS) tablet, Take 1 tablet by mouth daily., Disp: , Rfl:  .  Omega 3 1200 MG CAPS, Take 2,400  mg by mouth at bedtime., Disp: , Rfl:  .  timolol (TIMOPTIC) 0.5 % ophthalmic solution, Place 1 drop into both eyes 2 (two) times daily., Disp: , Rfl: 0 .  zaleplon (SONATA) 10 MG capsule, Take 10 mg by mouth at bedtime as needed for sleep., Disp: , Rfl:   Social History   Tobacco Use  Smoking Status Never Smoker  Smokeless Tobacco Never Used    Allergies  Allergen Reactions  . Fluoxetine Hcl Other (See Comments)    (PROZAC) Suicidal thoughts  . Chocolate Other (See Comments)    SOMETIMES CAUSES SEVERE HEADACHES  . Floxin [Ocuflox] Anxiety    shaky  . Other Nausea And Vomiting    INSTANT ICED TEA PACKETS  . Sulfonamide Derivatives Rash   Objective:  There were no vitals filed for  this visit. There is no height or weight on file to calculate BMI. Constitutional Well developed. Well nourished.  Vascular Dorsalis pedis pulses present 1+ bilaterally  Posterior tibial pulses absent bilaterally  Pedal hair growth absent. Capillary refill normal to all digits.  No cyanosis or clubbing noted.  Neurologic Normal speech. Oriented to person, place, and time. Epicritic sensation to light touch grossly present bilaterally. Protective sensation with 5.07 monofilament  present bilaterally. Vibratory sensation present bilaterally.  Dermatologic Nails elongated, thickened, dystrophic. No open wounds. No skin lesions.  Orthopedic: Normal joint ROM without pain or crepitus bilaterally. No visible deformities. No bony tenderness.   Assessment:   1. Onychomycosis   2. Diabetes mellitus type 2 with peripheral artery disease ([place])    Plan:  Patient was evaluated and treated and all questions answered.  Diabetes with PAD, Onychomycosis -Educated on diabetic footcare. Diabetic risk level 1 -Nails x10 debrided sharply and manually with large nail nipper and rotary burr.   Procedure: Nail Debridement Rationale: Patient meets criteria for routine foot care due to PAD Type of Debridement: manual, sharp debridement. Instrumentation: Nail nipper, rotary burr. Number of Nails: 10    No follow-ups on file.

## 2018-08-15 ENCOUNTER — Encounter: Payer: Self-pay | Admitting: Gastroenterology

## 2018-08-18 ENCOUNTER — Other Ambulatory Visit (INDEPENDENT_AMBULATORY_CARE_PROVIDER_SITE_OTHER): Payer: Commercial Managed Care - PPO

## 2018-08-18 ENCOUNTER — Encounter: Payer: Self-pay | Admitting: Internal Medicine

## 2018-08-18 ENCOUNTER — Ambulatory Visit (INDEPENDENT_AMBULATORY_CARE_PROVIDER_SITE_OTHER)
Admission: RE | Admit: 2018-08-18 | Discharge: 2018-08-18 | Disposition: A | Discharge status: Home / Self Care | Payer: Commercial Managed Care - PPO | Source: Ambulatory Visit | Attending: Internal Medicine | Admitting: Internal Medicine

## 2018-08-18 ENCOUNTER — Ambulatory Visit (INDEPENDENT_AMBULATORY_CARE_PROVIDER_SITE_OTHER): Payer: Commercial Managed Care - PPO | Admitting: Internal Medicine

## 2018-08-18 VITALS — BP 124/86 | HR 64 | Temp 98.4°F | Ht 66.0 in | Wt 236.0 lb

## 2018-08-18 DIAGNOSIS — E099 Drug or chemical induced diabetes mellitus without complications: Secondary | ICD-10-CM

## 2018-08-18 DIAGNOSIS — R0600 Dyspnea, unspecified: Secondary | ICD-10-CM | POA: Insufficient documentation

## 2018-08-18 DIAGNOSIS — E785 Hyperlipidemia, unspecified: Secondary | ICD-10-CM

## 2018-08-18 DIAGNOSIS — T380X5A Adverse effect of glucocorticoids and synthetic analogues, initial encounter: Secondary | ICD-10-CM

## 2018-08-18 DIAGNOSIS — R0609 Other forms of dyspnea: Secondary | ICD-10-CM

## 2018-08-18 DIAGNOSIS — I10 Essential (primary) hypertension: Secondary | ICD-10-CM | POA: Diagnosis not present

## 2018-08-18 LAB — BASIC METABOLIC PANEL
BUN: 26 mg/dL — ABNORMAL HIGH (ref 6–23)
CO2: 31 mEq/L (ref 19–32)
Calcium: 9.8 mg/dL (ref 8.4–10.5)
Chloride: 99 mEq/L (ref 96–112)
Creatinine, Ser: 1.26 mg/dL — ABNORMAL HIGH (ref 0.40–1.20)
GFR: 42.52 mL/min — AB (ref >60.00)
Glucose, Bld: 99 mg/dL (ref 70–99)
Potassium: 3.3 mEq/L — ABNORMAL LOW (ref 3.5–5.1)
Sodium: 136 mEq/L (ref 135–145)

## 2018-08-18 LAB — LIPID PANEL
Cholesterol: 135 mg/dL (ref 0–200)
HDL: 42.2 mg/dL (ref >39.00)
LDL Cholesterol: 58 mg/dL (ref 0–99)
NonHDL: 93.17
Total CHOL/HDL Ratio: 3
Triglycerides: 177 mg/dL — ABNORMAL HIGH (ref 0.0–149.0)
VLDL: 35.4 mg/dL (ref 0.0–40.0)

## 2018-08-18 LAB — URINALYSIS, ROUTINE W REFLEX MICROSCOPIC
Bilirubin Urine: NEGATIVE
Hgb urine dipstick: NEGATIVE
Ketones, ur: NEGATIVE
Nitrite: NEGATIVE
RBC / HPF: NONE SEEN (ref 0–?)
Specific Gravity, Urine: <=1.005 — AB (ref 1.000–1.030)
Total Protein, Urine: NEGATIVE
Urine Glucose: NEGATIVE
Urobilinogen, UA: 0.2 (ref 0.0–1.0)
pH: 7 (ref 5.0–8.0)

## 2018-08-18 LAB — HEPATIC FUNCTION PANEL
ALT: 23 U/L (ref 0–35)
AST: 19 U/L (ref 0–37)
Albumin: 4.2 g/dL (ref 3.5–5.2)
Alkaline Phosphatase: 100 U/L (ref 39–117)
Bilirubin, Direct: 0.1 mg/dL (ref 0.0–0.3)
Total Bilirubin: 0.4 mg/dL (ref 0.2–1.2)
Total Protein: 7.2 g/dL (ref 6.0–8.3)

## 2018-08-18 LAB — CBC WITH DIFFERENTIAL/PLATELET
Basophils Absolute: 0.1 10*3/uL (ref 0.0–0.1)
Basophils Relative: 1.1 % (ref 0.0–3.0)
Eosinophils Absolute: 0.2 10*3/uL (ref 0.0–0.7)
Eosinophils Relative: 3.4 % (ref 0.0–5.0)
HCT: 38.1 % (ref 36.0–46.0)
Hemoglobin: 12.8 g/dL (ref 12.0–15.0)
Lymphocytes Relative: 24.9 % (ref 12.0–46.0)
Lymphs Abs: 1.7 10*3/uL (ref 0.7–4.0)
MCHC: 33.5 g/dL (ref 30.0–36.0)
MCV: 82 fl (ref 78.0–100.0)
Monocytes Absolute: 0.8 10*3/uL (ref 0.1–1.0)
Monocytes Relative: 12.1 % — ABNORMAL HIGH (ref 3.0–12.0)
Neutro Abs: 3.9 10*3/uL (ref 1.4–7.7)
Neutrophils Relative %: 58.5 % (ref 43.0–77.0)
Platelets: 208 10*3/uL (ref 150.0–400.0)
RBC: 4.64 Mil/uL (ref 3.87–5.11)
RDW: 15.2 % (ref 11.5–15.5)
WBC: 6.6 10*3/uL (ref 4.0–10.5)

## 2018-08-18 LAB — BRAIN NATRIURETIC PEPTIDE: Pro B Natriuretic peptide (BNP): 229 pg/mL — ABNORMAL HIGH (ref 0.0–100.0)

## 2018-08-18 LAB — SEDIMENTATION RATE: Sed Rate: 31 mm/hr — ABNORMAL HIGH (ref 0–30)

## 2018-08-18 LAB — C-REACTIVE PROTEIN: CRP: 2.8 mg/dL (ref 0.5–20.0)

## 2018-08-18 LAB — HEMOGLOBIN A1C: Hgb A1c MFr Bld: 6 % (ref 4.6–6.5)

## 2018-08-18 NOTE — Assessment & Plan Note (Signed)
stable overall by history and exam, recent data reviewed with pt, and pt to continue medical treatment as before,  to f/u any worsening symptoms or concerns  

## 2018-08-18 NOTE — Patient Instructions (Signed)
Please continue all other medications as before, and refills have been done if requested.  Please have the pharmacy call with any other refills you may need.  Please continue your efforts at being more active, low cholesterol diet, and weight control.  Please keep your appointments with your specialists as you may have planned - pulmonary, and the sleep study  Please go to the XRAY Department in the Basement (go straight as you get off the elevator) for the x-ray testing  Please go to the LAB in the Basement (turn left off the elevator) for the tests to be done today  You will be contacted by phone if any changes need to be made immediately.  Otherwise, you will receive a letter about your results with an explanation, but please check with MyChart first.  Please remember to sign up for MyChart if you have not done so, as this will be important to you in the future with finding out test results, communicating by private email, and scheduling acute appointments online when needed.

## 2018-08-18 NOTE — Assessment & Plan Note (Signed)
Etiology unclear, for cxr and labs as ordered, exam benign

## 2018-08-18 NOTE — Progress Notes (Signed)
Subjective:    Patient ID: Tara [name], female    DOB: 1950/08/26, 68 y.o.   MRN: [id number]  HPI  Just back from [facility], walked about 7 miles she thinks, now with some sob sometimes at rest, but more like dyspnea with exertion after few steps only, no fever or ST, though has some occasional nonprod cough, and no blood.  Not sure if she has been wheezing.  Pt denies chest pain, orthopnea, PND, increased LE swelling, palpitations, dizziness or syncope.  Has some numbness to right hand after using mouse at work., but o/w Pt denies new neurological symptoms such as new headache, or facial or extremity weakness or numbness   Due for sleep study next wk, here to make sure Is stable, also has pulm appt soon as well Overall good compliance with meds  Pt denies polydipsia, polyuria    Past Medical History:  Diagnosis Date  . Anemia   . ANKLE PAIN, LEFT 04/01/2008  . ANXIETY 04/17/2007  . Arthritis   . Chronic kidney disease   . COLONIC POLYPS, HX OF 08/01/2007  . CONTUSIONS, MULTIPLE 04/01/2009  . DEPRESSION 04/17/2007  . DIZZINESS 08/01/2007  . DYSPNEA 08/01/2007   with exertion  . Enlargement of lymph nodes 08/13/2007  . GLUCOSE INTOLERANCE 08/01/2007  . 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 HYPERLIPIDEMIA 08/01/2007  . HYPERSOMNIA 07/28/2008  . HYPERTENSION 04/17/2007  . Impaired glucose tolerance 03/23/2011  . JOINT EFFUSION, LEFT KNEE 06/02/2010  . Loose body in knee 04/01/2009  . Migraines    "stopped 3-4 yr ago" (07/23/2012)  . Morbid obesity ([place]) 04/20/2007  . OTHER DISEASES OF LUNG NOT ELSEWHERE CLASSIFIED 08/01/2007  . Pain in joint, lower leg 04/01/2009  . PERIPHERAL EDEMA 04/21/2009  . Pre-diabetes   . Sarcoidosis 09/25/2007  . SHOULDER PAIN, LEFT 04/01/2009  . Sleep apnea    does not use Cpap   Past Surgical History:  Procedure Laterality Date  . BREAST SURGERY     Biopsy benign  . CATARACT EXTRACTION    . COMBINED MEDIASTINOSCOPY AND BRONCHOSCOPY  08/2007  . COMBINED  MEDIASTINOSCOPY AND BRONCHOSCOPY  2009  . Dental implant    . FRACTURE SURGERY  ?02/1997   "left upper arm; put rod in" (07/23/2012)  . GUM SURGERY  2000-?2009   "several ORs; soft tissue graft; took material from roof of mouth" (07/23/2012  . KNEE ARTHROSCOPY  03/2004; 06/2009   "right; left" Dr. Theda Sers  . LYMPH NODE BIOPSY  ~ 2009   "for sarcoidosis; don't know exactly which nodes" (07/23/2102)  . MYOMECTOMY  1994   Open  . REFRACTIVE SURGERY  08/1998   "both eyes" (07/23/2012)  . TOTAL KNEE ARTHROPLASTY Left 09/24/2016   Procedure: TOTAL KNEE ARTHROPLASTY;  Surgeon: Dorna Leitz, MD;  Location: [facility];  Service: Orthopedics;  Laterality: Left;    reports that she has never smoked. She has never used smokeless tobacco. She reports current alcohol use. She reports that she does not use drugs. family history includes Asthma in her sister; Diabetes in her mother; Heart disease (age of onset: 16) in her father; Hypertension in her father, mother, and sister; Stroke in her mother. Allergies  Allergen Reactions  . Fluoxetine Hcl Other (See Comments)    (PROZAC) Suicidal thoughts  . Chocolate Other (See Comments)    SOMETIMES CAUSES SEVERE HEADACHES  . Floxin [Ocuflox] Anxiety    shaky  . Other Nausea And Vomiting    INSTANT ICED TEA PACKETS  . Sulfonamide  Derivatives Rash   Review of Systems  Constitutional: Negative for other unusual diaphoresis or sweats HENT: Negative for ear discharge or swelling Eyes: Negative for other worsening visual disturbances Respiratory: Negative for stridor or other swelling  Gastrointestinal: Negative for worsening distension or other blood Genitourinary: Negative for retention or other urinary change Musculoskeletal: Negative for other MSK pain or swelling Skin: Negative for color change or other new lesions Neurological: Negative for worsening tremors and other numbness  Psychiatric/Behavioral: Negative for worsening agitation or other fatigue All other  system neg per pt    Objective:   Physical Exam BP 124/86   Pulse 64   Temp 98.4 F (36.9 C) (Oral)   Ht 5\' 6"  (1.676 m)   Wt 236 lb (107 kg)   SpO2 95%   BMI 38.09 kg/m  VS noted,  Constitutional: Pt appears in NAD HENT: Head: NCAT.  Right Ear: External ear normal.  Left Ear: External ear normal.  Eyes: . Pupils are equal, round, and reactive to light. Conjunctivae and EOM are normal Nose: without d/c or deformity Neck: Neck supple. Gross normal ROM Cardiovascular: Normal rate and regular rhythm.   Pulmonary/Chest: Effort normal and breath sounds decreased without rales or wheezing.  Abd:  Soft, NT, ND, + BS, no organomegaly Neurological: Pt is alert. At baseline orientation, motor grossly intact Skin: Skin is warm. No rashes, other new lesions, no LE edema Psychiatric: Pt behavior is normal without agitation  No other exam findings Lab Results  Component Value Date   WBC 6.1 09/24/2017   HGB 13.2 09/24/2017   HCT 39.5 09/24/2017   PLT 223.0 09/24/2017   GLUCOSE 98 03/27/2018   CHOL 145 03/27/2018   TRIG 186.0 (H) 03/27/2018   HDL 47.70 03/27/2018   LDLDIRECT 50.0 09/24/2017   LDLCALC 60 03/27/2018   ALT 30 03/27/2018   AST 24 03/27/2018   NA 136 03/27/2018   K 4.0 03/27/2018   CL 96 03/27/2018   CREATININE 1.24 (H) 03/27/2018   BUN 25 (H) 03/27/2018   CO2 28 03/27/2018   TSH 2.74 09/24/2017   INR 0.98 09/17/2016   HGBA1C 6.2 03/27/2018   MICROALBUR <0.7 09/24/2017        Assessment & Plan:

## 2018-08-18 NOTE — Assessment & Plan Note (Signed)
stable overall by history and exam, recent data reviewed with pt, and pt to continue medical treatment as before,  to f/u any worsening symptoms or concerns, for a1c with labs 

## 2018-08-19 ENCOUNTER — Other Ambulatory Visit: Payer: Self-pay | Admitting: Internal Medicine

## 2018-08-19 ENCOUNTER — Telehealth: Payer: Self-pay

## 2018-08-19 DIAGNOSIS — R0602 Shortness of breath: Secondary | ICD-10-CM

## 2018-08-19 LAB — TSH: TSH: 2.41 u[IU]/mL (ref 0.35–4.50)

## 2018-08-19 LAB — D-DIMER, QUANTITATIVE: D-Dimer, Quant: 1.36 mcg/mL FEU — ABNORMAL HIGH (ref <0.50)

## 2018-08-19 NOTE — Telephone Encounter (Signed)
Called pt, LVM.   CRM created.  

## 2018-08-19 NOTE — Telephone Encounter (Signed)
-----   Message from Biagio Borg, MD sent at 08/19/2018 12:55 PM EST ----- Left message on MyChart, pt to cont same tx except  The test results show that your current treatment is OK, except the D Dimer test is mild elevated, which raises the possibility of blood clot in lung, though you did not have low oxygen recently.  We should check a CT scan of the chest to further look in to this.  I will order, and you should hear from the office as well.    Shirron to please inform pt, I will do order

## 2018-08-20 ENCOUNTER — Telehealth: Payer: Self-pay

## 2018-08-20 NOTE — Telephone Encounter (Signed)
-----   Message from Morey Hummingbird sent at 08/20/2018  8:09 AM EST ----- Pt returned call and lab message given to her with verbal understanding. She would like to have the CT scan of her chest done.

## 2018-08-21 ENCOUNTER — Ambulatory Visit (INDEPENDENT_AMBULATORY_CARE_PROVIDER_SITE_OTHER)
Admission: RE | Admit: 2018-08-21 | Discharge: 2018-08-21 | Disposition: A | Discharge status: Home / Self Care | Payer: Commercial Managed Care - PPO | Source: Ambulatory Visit | Attending: Internal Medicine | Admitting: Internal Medicine

## 2018-08-21 DIAGNOSIS — R0602 Shortness of breath: Secondary | ICD-10-CM

## 2018-08-21 MED ORDER — IOPAMIDOL (ISOVUE-370) INJECTION 76%
75.0000 mL | Freq: Once | INTRAVENOUS | Status: AC | PRN
  Administered 2018-08-21: 75 mL via INTRAVENOUS

## 2018-09-01 ENCOUNTER — Encounter: Payer: Self-pay | Admitting: Gastroenterology

## 2018-09-01 ENCOUNTER — Ambulatory Visit (AMBULATORY_SURGERY_CENTER): Payer: Self-pay

## 2018-09-01 VITALS — Ht 66.0 in | Wt 230.4 lb

## 2018-09-01 DIAGNOSIS — Z86010 Personal history of colonic polyps: Secondary | ICD-10-CM

## 2018-09-01 MED ORDER — NA SULFATE-K SULFATE-MG SULF 17.5-3.13-1.6 GM/177ML PO SOLN: 1.0000 | Freq: Once | ORAL | 0 refills | Status: AC

## 2018-09-01 NOTE — Progress Notes (Signed)
Per pt, no allergies to soy or egg products.Pt not taking any weight loss meds or using  O2 at home.  Pt refused emmi video. 

## 2018-09-04 ENCOUNTER — Encounter: Payer: Self-pay | Admitting: Internal Medicine

## 2018-09-04 ENCOUNTER — Ambulatory Visit (INDEPENDENT_AMBULATORY_CARE_PROVIDER_SITE_OTHER): Payer: Commercial Managed Care - PPO | Admitting: Internal Medicine

## 2018-09-04 VITALS — BP 136/80 | HR 87 | Ht 66.0 in | Wt 232.8 lb

## 2018-09-04 DIAGNOSIS — R0609 Other forms of dyspnea: Secondary | ICD-10-CM

## 2018-09-04 DIAGNOSIS — R0600 Dyspnea, unspecified: Secondary | ICD-10-CM

## 2018-09-04 DIAGNOSIS — D869 Sarcoidosis, unspecified: Secondary | ICD-10-CM | POA: Diagnosis not present

## 2018-09-04 DIAGNOSIS — R609 Edema, unspecified: Secondary | ICD-10-CM | POA: Diagnosis not present

## 2018-09-04 DIAGNOSIS — G4733 Obstructive sleep apnea (adult) (pediatric): Secondary | ICD-10-CM

## 2018-09-04 MED ORDER — FLUTICASONE FUROATE-VILANTEROL 100-25 MCG/INH IN AEPB: 1.0000 | INHALATION_SPRAY | Freq: Every day | RESPIRATORY_TRACT | 0 refills | Status: DC

## 2018-09-04 NOTE — Progress Notes (Signed)
HPI F never smoker followed for sarcoid, OSA/ failed CPAP, complicated by hypercalcemia, renal insufficency, glaucoma PFT 02/07/15-minimal obstructive airways disease, minimal diffusion defect, insignificant response to bronchodilator ACE in July, 2017 remained elevated 74 (8-52) NPSG  12/ 11/09 - AhI 1.4/hour I desaturation to 89%, body weight 245 lbs Unattended Home Sleep Test 03/12/17-AHI 27.6/hour, desaturation to 75%, body weight 220 pounds --------------------------------------------------------------------------  02/27/2018- 68 year old female never smoker followed for Sarcoid, OSA/ failed CPAP complicated by hypercalcemia, renal insufficiency, HBP, depression, glaucoma -----OSA; Pt is not using CPAP or oral appliance-she still waiting to hear from Dr. Corky Sing office about approval for oral appliance.  Waiting on insurance determination for oral appliance with Dr Toy Cookey. Possible delay because she just went on Medicare. Could not tolerate CPAP because of dry eyes. Notes dry cough. Discussed occasional reflux, beta blockers including timolol eye drops, occasional post nasal drip.  Adderall 30 mg twice daily remains sufficient for daytime sleepiness. 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  09/04/2018- 68 year old female never smoker followed for Sarcoid, OSA/ oral appliance Dr Toy Cookey complicated by hypercalcemia, renal insufficiency, HBP, depression, glaucoma -----OSA: Pt uses oral appliance for sleep apnea; had sleep study last week through Dr. Toy Cookey and due for another one on March 9th.  Weight 232 pounds today She is working with Dr. Toy Cookey to adjust oral appliance.  Has not settled into a regular pattern yet. Dr Toy Care / Psy provides Adderall. Still much trouble with dry and painful eyes with frequent blinking.  Has seen a number of different ophthalmologists. Only occasional dry cough without wheeze, on Timoptic Dr. [name] recently for shortness  of breath.  CT was negative for PE, d-dimer elevated 1.36, BNP elevated 229.  We reviewed the CT report which described cardiac enlargement. Admits easier choking with food and drink recently.  I discussed LPR management and chin tuck.  ROS- see HPI  + = positive Constitutional:     weight loss, no-night sweats, fevers, chills, + fatigue, lassitude. HEENT:   No-  headaches, +difficulty swallowing, tooth/dental problems, sore throat,       No-  sneezing, itching, ear ache, nasal congestion, post nasal drip,  CV:  No- chest pain, no-orthopnea, PND, swelling in lower extremities, anasarca,dizziness, +palpitations  Resp: + shortness of breath with exertion or at rest.              No-   productive cough,  + non-productive cough,  No- coughing up of blood.              No- wheezing.   Skin: No-   rash or lesions. GI:  No-   heartburn, indigestion, abdominal pain, nausea, vomiting,  GU:  MS:  +  joint pain or swelling.   Neuro-     nothing unusual Psych:  No- change in mood or affect. No depression or anxiety.  No memory loss.  OBJ- Physical Exam   General- Alert, Oriented, Affect-appropriate, Distress- none acute, + overweight Skin- rash-none, lesions- none, excoriation- none,  Lymphadenopathy- none Head- atraumatic            Eyes- +continues frequent blinking             Ears- Hearing, canals-normal            Nose- Clear, no-Septal dev, mucus, polyps, erosion, perforation             Throat- Mallampati III, mucosa clear , drainage- none, tonsils- atrophic Neck- flexible ,  trachea midline, no stridor , thyroid nl, carotid no bruit Chest - symmetrical excursion , unlabored           Heart/CV- RRR + extra beats , no murmur , no gallop  , no rub, nl s1 s2                           - JVD- none , + elastic hose, stasis changes- none, varices- none           Lung- clear to P&A, wheeze- none, cough- none , dullness-none, rub- none           Chest wall-  Abd-  Br/ Gen/ Rectal- Not done, not  indicated Extrem- cyanosis- none, clubbing, none, atrophy- none, strength- nl Neuro- +blinking, grimacing, ? Tics or frontal lobe ?

## 2018-09-04 NOTE — Patient Instructions (Signed)
Order- schedule PFT     Dx dyspnea on exertion  If you continue to be short of breath--You can ask Dr [name] if you should see a cardiologist since you had an elevated BNP and some cardiac enlargement on your CT scan.  Sample Breo 100    Inhale 1 puff then rinse mouth, once daily

## 2018-09-05 NOTE — Assessment & Plan Note (Signed)
Old sarcoid scarring can cause cough.  Clinically stable.  Cough may reflect LPR.  It might also reflect mild CHF.

## 2018-09-05 NOTE — Assessment & Plan Note (Signed)
Cardiac enlargement on CT scan and elevated BNP invite the possibility of mild CHF which could also contribute to dyspnea on exertion. Plan-I recommended that she discuss these issues with her PCP or cardiologist.

## 2018-09-05 NOTE — Assessment & Plan Note (Signed)
Working with Dr. Toy Cookey, fitting an oral appliance.

## 2018-09-08 ENCOUNTER — Encounter: Payer: Self-pay | Admitting: Gastroenterology

## 2018-09-08 ENCOUNTER — Ambulatory Visit (AMBULATORY_SURGERY_CENTER): Payer: Commercial Managed Care - PPO | Admitting: Gastroenterology

## 2018-09-08 VITALS — BP 158/70 | HR 60 | Temp 97.3°F | Resp 13 | Ht 66.0 in | Wt 230.0 lb

## 2018-09-08 DIAGNOSIS — D123 Benign neoplasm of transverse colon: Secondary | ICD-10-CM

## 2018-09-08 DIAGNOSIS — Z1211 Encounter for screening for malignant neoplasm of colon: Secondary | ICD-10-CM

## 2018-09-08 DIAGNOSIS — D125 Benign neoplasm of sigmoid colon: Secondary | ICD-10-CM

## 2018-09-08 DIAGNOSIS — Z86010 Personal history of colonic polyps: Secondary | ICD-10-CM

## 2018-09-08 MED ORDER — SODIUM CHLORIDE 0.9 % IV SOLN: 500.0000 mL | Freq: Once | INTRAVENOUS | Status: DC

## 2018-09-08 NOTE — Progress Notes (Signed)
To PACU, VSS. Report to [email or web address] 

## 2018-09-08 NOTE — Patient Instructions (Signed)
YOU HAD AN ENDOSCOPIC PROCEDURE TODAY AT [facility]:   Refer to the procedure report that was given to you for any specific questions about what was found during the examination.  If the procedure report does not answer your questions, please call your gastroenterologist to clarify.  If you requested that your care partner not be given the details of your procedure findings, then the procedure report has been included in a sealed envelope for you to review at your convenience later.  YOU SHOULD EXPECT: Some feelings of bloating in the abdomen. Passage of more gas than usual.  Walking can help get rid of the air that was put into your GI tract during the procedure and reduce the bloating. If you had a lower endoscopy (such as a colonoscopy or flexible sigmoidoscopy) you may notice spotting of blood in your stool or on the toilet paper. If you underwent a bowel prep for your procedure, you may not have a normal bowel movement for a few days.  Please Note:  You might notice some irritation and congestion in your nose or some drainage.  This is from the oxygen used during your procedure.  There is no need for concern and it should clear up in a day or so.  SYMPTOMS TO REPORT IMMEDIATELY:   Following lower endoscopy (colonoscopy or flexible sigmoidoscopy):  Excessive amounts of blood in the stool  Significant tenderness or worsening of abdominal pains  Swelling of the abdomen that is new, acute  Fever of 100F or higher  For urgent or emergent issues, a gastroenterologist can be reached at any hour by calling [phone].   DIET:  We do recommend a small meal at first, but then you may proceed to your regular diet.  Drink plenty of fluids but you should avoid alcoholic beverages for 24 hours.  MEDICATIONS: Continue present medications. NO Ibuprofen, Alleve, Naproxen, or other non-steroidal anti-inflammatory drugs for 2 weeks after polyp removal.  Please see handouts given to you  by your recovery nurse.  ACTIVITY:  You should plan to take it easy for the rest of today and you should NOT DRIVE or use heavy machinery until tomorrow (because of the sedation medicines used during the test).    FOLLOW UP: Our staff will call the number listed on your records the next business day following your procedure to check on you and address any questions or concerns that you may have regarding the information given to you following your procedure. If we do not reach you, we will leave a message.  However, if you are feeling well and you are not experiencing any problems, there is no need to return our call.  We will assume that you have returned to your regular daily activities without incident.  If any biopsies were taken you will be contacted by phone or by letter within the next 1-3 weeks.  Please call us at [phone] if you have not heard about the biopsies in 3 weeks.   Thank you for allowing [place] to provide for your healthcare needs today.  SIGNATURES/CONFIDENTIALITY: You and/or your care partner have signed paperwork which will be entered into your electronic medical record.  These signatures attest to the fact that that the information above on your After Visit Summary has been reviewed and is understood.  Full responsibility of the confidentiality of this discharge information lies with you and/or your care-partner.

## 2018-09-08 NOTE — Op Note (Signed)
[name]: Tara [name] Procedure Date: 09/08/2018 2:24 PM MRN: [id number] Endoscopist: Mauri Pole , MD Age: 68 Referring MD:  Date of Birth: 1950/09/19 Gender: Female Account #: [id number] Procedure:                Colonoscopy Indications:              Screening for colorectal malignant neoplasm Medicines:                Monitored Anesthesia Care Procedure:                Pre-Anesthesia Assessment:                           - Prior to the procedure, a History and Physical                            was performed, and patient medications and                            allergies were reviewed. The patient's tolerance of                            previous anesthesia was also reviewed. The risks                            and benefits of the procedure and the sedation                            options and risks were discussed with the patient.                            All questions were answered, and informed consent                            was obtained. Prior Anticoagulants: The patient has                            taken no previous anticoagulant or antiplatelet                            agents. ASA Grade Assessment: II - A patient with                            mild systemic disease. After reviewing the risks                            and benefits, the patient was deemed in                            satisfactory condition to undergo the procedure.                           After obtaining informed consent, the colonoscope  was passed under direct vision. Throughout the                            procedure, the patient's blood pressure, pulse, and                            oxygen saturations were monitored continuously. The                            Colonoscope was introduced through the anus and                            advanced to the the cecum, identified by                            appendiceal orifice  and ileocecal valve. The                            colonoscopy was performed without difficulty. The                            patient tolerated the procedure well. The quality                            of the bowel preparation was good. The ileocecal                            valve, appendiceal orifice, and rectum were                            photographed. Scope In: 2:39:29 PM Scope Out: 2:56:33 PM Scope Withdrawal Time: 0 hours 13 minutes 12 seconds  Total Procedure Duration: 0 hours 17 minutes 4 seconds  Findings:                 The perianal and digital rectal examinations were                            normal.                           Two sessile polyps were found in the sigmoid colon                            and transverse colon. The polyps were 10 to 18 mm                            in size. These polyps were removed with a cold                            biopsy forceps. Resection and retrieval were                            complete.  A few small and large-mouthed diverticula were                            found in the sigmoid colon and descending colon.                           Non-bleeding internal hemorrhoids were found during                            retroflexion. The hemorrhoids were small. Complications:            No immediate complications. Estimated Blood Loss:     Estimated blood loss was minimal. Impression:               - Two 10 to 18 mm polyps in the sigmoid colon and                            in the transverse colon, removed with a cold biopsy                            forceps. Resected and retrieved.                           - Moderate diverticulosis in the sigmoid colon and                            in the descending colon.                           - Non-bleeding internal hemorrhoids. Recommendation:           - Patient has a contact number available for                            emergencies. The signs and symptoms  of potential                            delayed complications were discussed with the                            patient. Return to normal activities tomorrow.                            Written discharge instructions were provided to the                            patient.                           - Resume previous diet.                           - Continue present medications.                           - Await pathology results.                           -  Repeat colonoscopy in 3 years for surveillance                            based on pathology results. Mauri Pole, MD 09/08/2018 3:01:50 PM This report has been signed electronically.

## 2018-09-09 ENCOUNTER — Telehealth: Payer: Self-pay

## 2018-09-09 NOTE — Telephone Encounter (Signed)
Called [phone] and left a messaged we tried to reach pt for a follow up call. maw

## 2018-09-09 NOTE — Telephone Encounter (Signed)
  Follow up Call-  Call back number 09/08/2018  Post procedure Call Back phone  # [phone]  Permission to leave phone message Yes  Some recent data might be hidden     Patient questions:  Do you have a fever, pain , or abdominal swelling? No. Pain Score  0 *  Have you tolerated food without any problems? Yes.    Have you been able to return to your normal activities? Yes.    Do you have any questions about your discharge instructions: Diet   No. Medications  No. Follow up visit  No.  Do you have questions or concerns about your Care? No.  Actions: * If pain score is 4 or above: No action needed, pain <4.

## 2018-09-12 ENCOUNTER — Encounter: Payer: Self-pay | Admitting: Gastroenterology

## 2018-09-17 ENCOUNTER — Other Ambulatory Visit: Payer: Self-pay | Admitting: Internal Medicine

## 2018-09-25 ENCOUNTER — Ambulatory Visit: Payer: Self-pay | Admitting: Internal Medicine

## 2018-10-01 ENCOUNTER — Other Ambulatory Visit: Payer: Self-pay | Admitting: Internal Medicine

## 2018-10-18 ENCOUNTER — Other Ambulatory Visit: Payer: Self-pay | Admitting: Internal Medicine

## 2018-10-23 ENCOUNTER — Ambulatory Visit: Payer: Commercial Managed Care - PPO | Admitting: Podiatry

## 2018-10-24 ENCOUNTER — Ambulatory Visit: Payer: Commercial Managed Care - PPO | Admitting: Podiatry

## 2018-11-04 ENCOUNTER — Telehealth: Payer: Self-pay

## 2018-11-04 DIAGNOSIS — G244 Idiopathic orofacial dystonia: Secondary | ICD-10-CM

## 2018-11-04 NOTE — Telephone Encounter (Signed)
Sonia Baller has been notified that PCP will order labs.

## 2018-11-04 NOTE — Telephone Encounter (Signed)
Ok for this - to see orders

## 2018-11-04 NOTE — Addendum Note (Signed)
Addended by: Biagio Borg on: 11/04/2018 02:19 PM   Modules accepted: Orders

## 2018-11-04 NOTE — Progress Notes (Signed)
[name] [address], [place] [address] Phone: [phone] Subjective:   Virtual Visit via Video Note  I connected with Tara [name] on 11/06/18 at 11:00 AM EDT by a video enabled telemedicine application and verified that I am speaking with the correct person using two identifiers.   I discussed the limitations of evaluation and management by telemedicine and the availability of in person appointments. The patient expressed understanding and agreed to proceed.  Patient was in her home establishment and I was in the work establishment.  Difficulty with the video platform but was able to talk over telephone.    I discussed the assessment and treatment plan with the patient. The patient was provided an opportunity to ask questions and all were answered. The patient agreed with the plan and demonstrated an understanding of the instructions.   The patient was advised to call back or seek an in-person evaluation if the symptoms worsen or if the condition fails to improve as anticipated.  I provided 10 minutes of non-face-to-face time during this encounter.   Lyndal Pulley, DO    TTS:[id number]  Tara [name] is a 68 y.o. female coming in with complaint of mild aches and pains.  Patient has been found to have gout.  Patient wants to discontinue allopurinol.  Wants to see if there is any possible problem with this.  Has been feeling good.  Last time we checked uric acid was in 2018.     Past Medical History:  Diagnosis Date  . Anemia   . ANKLE PAIN, LEFT 04/01/2008  . ANXIETY 04/17/2007  . Arthritis   . Chronic kidney disease    stage 3  . Colon polyp   . COLONIC POLYPS, HX OF 08/01/2007  . CONTUSIONS, MULTIPLE 04/01/2009  . DEPRESSION 04/17/2007  . DIZZINESS 08/01/2007  . DYSPNEA 08/01/2007   with exertion  . Enlargement of lymph nodes 08/13/2007  . Excessive involuntary blinking    per pt,going on since 2018  . GLUCOSE INTOLERANCE  08/01/2007  . 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 HYPERLIPIDEMIA 08/01/2007   no meds  . HYPERSOMNIA 07/28/2008  . HYPERTENSION 04/17/2007  . Impaired glucose tolerance 03/23/2011  . JOINT EFFUSION, LEFT KNEE 06/02/2010  . Loose body in knee 04/01/2009   pt not sure?  . Metabolic encephalopathy [id number]/33/[id number]/6333  . Migraines    "stopped 3-4 yr ago" (07/23/2012)  . Morbid obesity ([place]) 04/20/2007  . OTHER DISEASES OF LUNG NOT ELSEWHERE CLASSIFIED 08/01/2007  . Pain in joint, lower leg 04/01/2009  . PERIPHERAL EDEMA 04/21/2009  . Pre-diabetes   . Sarcoidosis 09/25/2007  . SHOULDER PAIN, LEFT 04/01/2009  . Sleep apnea    does not use Cpap   Past Surgical History:  Procedure Laterality Date  . BREAST SURGERY     Biopsy benign/bil breaST  . CATARACT EXTRACTION     RIGHT EYE  . COLONOSCOPY    . COMBINED MEDIASTINOSCOPY AND BRONCHOSCOPY  08/2007  . COMBINED MEDIASTINOSCOPY AND BRONCHOSCOPY  2009  . Dental implant    . FRACTURE SURGERY  ?02/1997   "left upper arm; put rod in" (07/23/2012)  . GUM SURGERY  2000-?2009   "several ORs; soft tissue graft; took material from roof of mouth" (07/23/2012  . HUMERUS SURGERY Left 1998   rod insertion  . KNEE ARTHROSCOPY  03/2004; 06/2009   "right; left" Dr. Theda Sers  . KNEE SURGERY  2012   ARTHROSCOPIC LEFT KNEE  . LYMPH NODE BIOPSY  ~  2009   "for sarcoidosis; don't know exactly which nodes" (07/23/2102)  . MYOMECTOMY  1994   Open  . REFRACTIVE SURGERY  08/1998   "both eyes" (07/23/2012)  . REFRACTIVE SURGERY  2000   Bil  . TOTAL KNEE ARTHROPLASTY Left 09/24/2016   Procedure: TOTAL KNEE ARTHROPLASTY;  Surgeon: Dorna Leitz, MD;  Location: [facility];  Service: Orthopedics;  Laterality: Left;   Social History   Socioeconomic History  . Marital status: Widowed    Spouse name: Not on file  . Number of children: Not on file  . Years of education: Not on file  . Highest education level: Not on file  Occupational History  . Occupation: Designer, jewellery   Social Needs  . Financial resource strain: Not on file  . Food insecurity:    Worry: Not on file    Inability: Not on file  . Transportation needs:    Medical: Not on file    Non-medical: Not on file  Tobacco Use  . Smoking status: Never Smoker  . Smokeless tobacco: Never Used  Substance and Sexual Activity  . Alcohol use: Yes    Comment: Occas  . Drug use: No  . Sexual activity: Not Currently    Birth control/protection: Post-menopausal    Comment: 1st intercourse 72 yo-1 partner  Lifestyle  . Physical activity:    Days per week: Not on file    Minutes per session: Not on file  . Stress: Not on file  Relationships  . Social connections:    Talks on phone: Not on file    Gets together: Not on file    Attends religious service: Not on file    Active member of club or organization: Not on file    Attends meetings of clubs or organizations: Not on file    Relationship status: Not on file  Other Topics Concern  . Not on file  Social History Narrative   Lives with 4 cats   Allergies  Allergen Reactions  . Fluoxetine Hcl Other (See Comments)    (PROZAC) Suicidal thoughts  . Chocolate Other (See Comments)    SOMETIMES CAUSES SEVERE HEADACHES  . Floxin [Ocuflox] Anxiety    shaky  . Other Nausea And Vomiting    INSTANT ICED TEA PACKETS  . Sulfonamide Derivatives Rash   Family History  Problem Relation Age of Onset  . Diabetes Mother   . Hypertension Mother   . Stroke Mother   . Hypertension Sister   . Asthma Sister   . Gout Sister   . Heart disease Father 81  . Hypertension Father   . Colon cancer Neg Hx       Current Outpatient Medications (Cardiovascular):  .  atenolol (TENORMIN) 50 MG tablet, TAKE 1 TABLET(50 MG) BY MOUTH DAILY .  cloNIDine (CATAPRES) 0.1 MG tablet, TAKE 1 TABLET(0.1 MG) BY MOUTH TWICE DAILY .  DILT-XR 240 MG 24 hr capsule, TAKE 1 CAPSULE(240 MG) BY MOUTH DAILY .  furosemide (LASIX) 40 MG tablet, Take 1 tablet (40 mg total) by mouth 2  (two) times daily. (Patient taking differently: Take 40 mg by mouth daily. ) .  hydrALAZINE (APRESOLINE) 100 MG tablet, Take 1 tablet (100 mg total) by mouth every 8 (eight) hours. (Patient taking differently: Take 100 mg by mouth 3 (three) times daily. )   Current Outpatient Medications (Respiratory):  .  fluticasone furoate-vilanterol (BREO ELLIPTA) 100-25 MCG/INH AEPB, Inhale 1 puff into the lungs daily. (Patient not taking: Reported on 09/08/2018)  Current Outpatient Medications (Analgesics):  .  allopurinol (ZYLOPRIM) 100 MG tablet, TAKE 1 TABLET(100 MG) BY MOUTH DAILY .  aspirin EC 81 MG tablet, Take 81 mg by mouth daily.   Current Outpatient Medications (Hematological):  .  ferrous sulfate 325 (65 FE) MG tablet, Take 325 mg by mouth daily with breakfast.   Current Outpatient Medications (Other):  .  amphetamine-dextroamphetamine (ADDERALL) 30 MG tablet, Take 30 mg by mouth 2 (two) times daily. Marland Kitchen  antiseptic oral rinse (BIOTENE) LIQD, 15 mLs by Mouth Rinse route 2 (two) times daily as needed for dry mouth. .  clorazepate (TRANXENE) 7.5 MG tablet, Take 7.5 mg by mouth 3 (three) times daily. Marland Kitchen  desvenlafaxine (PRISTIQ) 100 MG 24 hr tablet, Take 100 mg by mouth at bedtime. Marland Kitchen  latanoprost (XALATAN) 0.005 % ophthalmic solution, Place 1 drop into both eyes at bedtime. .  Melatonin 5 MG TABS, Take 10 mg by mouth at bedtime. .  Multiple Vitamins-Minerals (MULTIVITAMIN WITH MINERALS) tablet, Take 1 tablet by mouth daily. .  Omega 3 1200 MG CAPS, Take 2,400 mg by mouth at bedtime. .  timolol (TIMOPTIC) 0.5 % ophthalmic solution, Place 1 drop into both eyes 2 (two) times daily. Marland Kitchen  UNABLE TO FIND, Med Name: Special blend of serum eye drops made at [facility]. .  zaleplon (SONATA) 10 MG capsule, Take 10 mg by mouth at bedtime as needed for sleep.  Current Facility-Administered Medications (Other):  .  0.9 %  sodium chloride infusion    Past medical history, social, surgical and family  history all reviewed in electronic medical record.  No pertanent information unless stated regarding to the chief complaint.   Review of Systems:  No headache, visual changes, nausea, vomiting, diarrhea, constipation, dizziness, abdominal pain, skin rash, fevers, chills, night sweats, weight loss, swollen lymph nodes, body aches, joint swelling, chest pain, shortness of breath, mood changes.  Mild positive muscle aches  Objective     Visual platform did not work.  We talked over audio just fine though.    Impression and Recommendations:     This case required medical decision making of moderate complexity. The above documentation has been reviewed and is accurate and complete Lyndal Pulley, DO       Note: This dictation was prepared with Dragon dictation along with smaller phrase technology. Any transcriptional errors that result from this process are unintentional.

## 2018-11-04 NOTE — Telephone Encounter (Signed)
I have received the fax. It contains lab orders that Dr. Dimas Millin would like to have done at PCP office because of close convenience for the patient. I have spoke to Carl, Therapist, sports and informed her that we normally do not do lab orders for physician outside of the [facility] practice but I would discuss with PCP to see if he will allow these labs to be entered.   Dr. [name] please advise. I have the lab orders on my desk that is being requested.

## 2018-11-04 NOTE — Telephone Encounter (Signed)
Called Sonia Baller and requested fax to be sent to [facility] fax number.   Copied from [place] [phone]. Topic: General - Other >> Nov 03, 2018  1:31 PM Celene Kras A wrote: Reason for CRM: Sonia Baller, from [facility], called requesting to know if a fax for pt had been received. She is requesting to be contacted at [phone] with update. Please advise.

## 2018-11-06 ENCOUNTER — Ambulatory Visit (INDEPENDENT_AMBULATORY_CARE_PROVIDER_SITE_OTHER): Payer: Commercial Managed Care - PPO | Admitting: Family Medicine

## 2018-11-06 ENCOUNTER — Encounter: Payer: Self-pay | Admitting: Family Medicine

## 2018-11-06 DIAGNOSIS — N189 Chronic kidney disease, unspecified: Secondary | ICD-10-CM

## 2018-11-06 DIAGNOSIS — N179 Acute kidney failure, unspecified: Secondary | ICD-10-CM | POA: Diagnosis not present

## 2018-11-06 NOTE — Assessment & Plan Note (Signed)
Patient is in chronic kidney disease.  Likely in the area where excessive uric acid can occur.  We discussed with patient about discontinuing for 1 to 2 weeks and then following up through my chart.  We discussed if worsening symptoms we would restart the medication.  Patient is in agreement with the plan.  Otherwise continue all other medications.

## 2018-11-07 ENCOUNTER — Other Ambulatory Visit (INDEPENDENT_AMBULATORY_CARE_PROVIDER_SITE_OTHER): Payer: Commercial Managed Care - PPO

## 2018-11-07 DIAGNOSIS — G244 Idiopathic orofacial dystonia: Secondary | ICD-10-CM

## 2018-11-07 LAB — SEDIMENTATION RATE: Sed Rate: 13 mm/hr (ref 0–30)

## 2018-11-07 NOTE — Telephone Encounter (Signed)
Patient had labs drawn this morning. She would like them faxed to Dr Dimas Millin when they are completed.

## 2018-11-07 NOTE — Telephone Encounter (Signed)
Sonia Baller, RN with Dr. Kandra Nicolas office has access to Epic and will be ale to view the results there.

## 2018-11-10 LAB — ANGIOTENSIN CONVERTING ENZYME: Angiotensin-Converting Enzyme: 60 U/L (ref 9–67)

## 2018-11-14 ENCOUNTER — Ambulatory Visit (INDEPENDENT_AMBULATORY_CARE_PROVIDER_SITE_OTHER): Payer: Commercial Managed Care - PPO | Admitting: Podiatry

## 2018-11-14 ENCOUNTER — Other Ambulatory Visit: Payer: Self-pay

## 2018-11-14 ENCOUNTER — Encounter: Payer: Self-pay | Admitting: Podiatry

## 2018-11-14 DIAGNOSIS — E1169 Type 2 diabetes mellitus with other specified complication: Secondary | ICD-10-CM

## 2018-11-14 DIAGNOSIS — E1151 Type 2 diabetes mellitus with diabetic peripheral angiopathy without gangrene: Secondary | ICD-10-CM | POA: Diagnosis not present

## 2018-11-14 DIAGNOSIS — B351 Tinea unguium: Secondary | ICD-10-CM

## 2018-12-03 ENCOUNTER — Ambulatory Visit: Payer: Self-pay | Admitting: Internal Medicine

## 2018-12-14 NOTE — Progress Notes (Signed)
Subjective:  Patient ID: Tara [name], female    DOB: 1951-04-17,  MRN: [id number]  Chief Complaint  Patient presents with  . Debridement    Trim toenails  . Diabetes    Last a1c was 6.0    68 y.o. female presents  for diabetic foot care. Last A1c 6.0 Denies numbness and tingling in their feet. Denies cramping in legs and thighs.  Review of Systems: Negative except as noted in the HPI. Denies N/V/F/Ch.  Past Medical History:  Diagnosis Date  . Anemia   . ANKLE PAIN, LEFT 04/01/2008  . ANXIETY 04/17/2007  . Arthritis   . Chronic kidney disease    stage 3  . Colon polyp   . COLONIC POLYPS, HX OF 08/01/2007  . CONTUSIONS, MULTIPLE 04/01/2009  . DEPRESSION 04/17/2007  . DIZZINESS 08/01/2007  . DYSPNEA 08/01/2007   with exertion  . Enlargement of lymph nodes 08/13/2007  . Excessive involuntary blinking    per pt,going on since 2018  . GLUCOSE INTOLERANCE 08/01/2007  . 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 HYPERLIPIDEMIA 08/01/2007   no meds  . HYPERSOMNIA 07/28/2008  . HYPERTENSION 04/17/2007  . Impaired glucose tolerance 03/23/2011  . JOINT EFFUSION, LEFT KNEE 06/02/2010  . Loose body in knee 04/01/2009   pt not sure?  . Metabolic encephalopathy 40/98/1191-47/8295  . Migraines    "stopped 3-4 yr ago" (07/23/2012)  . Morbid obesity ([place]) 04/20/2007  . OTHER DISEASES OF LUNG NOT ELSEWHERE CLASSIFIED 08/01/2007  . Pain in joint, lower leg 04/01/2009  . PERIPHERAL EDEMA 04/21/2009  . Pre-diabetes   . Sarcoidosis 09/25/2007  . SHOULDER PAIN, LEFT 04/01/2009  . Sleep apnea    does not use Cpap    Current Outpatient Medications:  .  allopurinol (ZYLOPRIM) 100 MG tablet, TAKE 1 TABLET(100 MG) BY MOUTH DAILY, Disp: 90 tablet, Rfl: 1 .  amphetamine-dextroamphetamine (ADDERALL) 30 MG tablet, Take 30 mg by mouth 2 (two) times daily., Disp: , Rfl:  .  antiseptic oral rinse (BIOTENE) LIQD, 15 mLs by Mouth Rinse route 2 (two) times daily as needed for dry mouth., Disp: , Rfl:  .   aspirin EC 81 MG tablet, Take 81 mg by mouth daily., Disp: , Rfl:  .  atenolol (TENORMIN) 50 MG tablet, TAKE 1 TABLET(50 MG) BY MOUTH DAILY, Disp: 90 tablet, Rfl: 3 .  cloNIDine (CATAPRES) 0.1 MG tablet, TAKE 1 TABLET(0.1 MG) BY MOUTH TWICE DAILY, Disp: 180 tablet, Rfl: 1 .  clorazepate (TRANXENE) 7.5 MG tablet, Take 7.5 mg by mouth 3 (three) times daily., Disp: , Rfl:  .  desvenlafaxine (PRISTIQ) 100 MG 24 hr tablet, Take 100 mg by mouth at bedtime., Disp: , Rfl:  .  DILT-XR 240 MG 24 hr capsule, TAKE 1 CAPSULE(240 MG) BY MOUTH DAILY, Disp: 90 capsule, Rfl: 1 .  ferrous sulfate 325 (65 FE) MG tablet, Take 325 mg by mouth daily with breakfast., Disp: , Rfl:  .  fluticasone furoate-vilanterol (BREO ELLIPTA) 100-25 MCG/INH AEPB, Inhale 1 puff into the lungs daily. (Patient not taking: Reported on 09/08/2018), Disp: 1 each, Rfl: 0 .  furosemide (LASIX) 40 MG tablet, Take 1 tablet (40 mg total) by mouth 2 (two) times daily. (Patient taking differently: Take 40 mg by mouth daily. ), Disp: 30 tablet, Rfl: 0 .  hydrALAZINE (APRESOLINE) 100 MG tablet, Take 1 tablet (100 mg total) by mouth every 8 (eight) hours. (Patient taking differently: Take 100 mg by mouth 3 (three) times daily. ), Disp: , Rfl:  .  latanoprost (XALATAN) 0.005 % ophthalmic solution, Place 1 drop into both eyes at bedtime., Disp: , Rfl:  .  Melatonin 5 MG TABS, Take 10 mg by mouth at bedtime., Disp: , Rfl:  .  Multiple Vitamins-Minerals (MULTIVITAMIN WITH MINERALS) tablet, Take 1 tablet by mouth daily., Disp: , Rfl:  .  Omega 3 1200 MG CAPS, Take 2,400 mg by mouth at bedtime., Disp: , Rfl:  .  timolol (TIMOPTIC) 0.5 % ophthalmic solution, Place 1 drop into both eyes 2 (two) times daily., Disp: , Rfl: 0 .  UNABLE TO FIND, Med Name: Special blend of serum eye drops made at [facility]., Disp: , Rfl:  .  zaleplon (SONATA) 10 MG capsule, Take 10 mg by mouth at bedtime as needed for sleep., Disp: , Rfl:   Current Facility-Administered Medications:   .  0.9 %  sodium chloride infusion, 500 mL, Intravenous, Once, Nandigam, Venia Minks, MD  Social History   Tobacco Use  Smoking Status Never Smoker  Smokeless Tobacco Never Used    Allergies  Allergen Reactions  . Fluoxetine Hcl Other (See Comments)    (PROZAC) Suicidal thoughts  . Chocolate Other (See Comments)    SOMETIMES CAUSES SEVERE HEADACHES  . Floxin [Ocuflox] Anxiety    shaky  . Other Nausea And Vomiting    INSTANT ICED TEA PACKETS  . Sulfonamide Derivatives Rash   Objective:  There were no vitals filed for this visit. There is no height or weight on file to calculate BMI. Constitutional Well developed. Well nourished.  Vascular Dorsalis pedis pulses present 1+ bilaterally  Posterior tibial pulses absent bilaterally  Pedal hair growth absent. Capillary refill normal to all digits.  No cyanosis or clubbing noted.  Neurologic Normal speech. Oriented to person, place, and time. Epicritic sensation to light touch grossly present bilaterally. Protective sensation with 5.07 monofilament  present bilaterally. Vibratory sensation present bilaterally.  Dermatologic Nails elongated, thickened, dystrophic. No open wounds. No skin lesions.  Orthopedic: Normal joint ROM without pain or crepitus bilaterally. No visible deformities. No bony tenderness.   Assessment:   1. Onychomycosis of multiple toenails with type 2 diabetes mellitus and peripheral angiopathy ([place])    Plan:  Patient was evaluated and treated and all questions answered.  Diabetes with PAD, Onychomycosis -Routine foot care as below  Procedure: Nail Debridement Rationale: Patient meets criteria for routine foot care due to PAD Type of Debridement: manual, sharp debridement. Instrumentation: Nail nipper, rotary burr. Number of Nails: 10   Return in about 3 months (around 02/14/2019) for Diabetic Foot Care.

## 2019-02-19 ENCOUNTER — Ambulatory Visit (INDEPENDENT_AMBULATORY_CARE_PROVIDER_SITE_OTHER): Payer: Commercial Managed Care - PPO | Admitting: Podiatry

## 2019-02-19 ENCOUNTER — Other Ambulatory Visit: Payer: Self-pay

## 2019-02-19 DIAGNOSIS — B351 Tinea unguium: Secondary | ICD-10-CM | POA: Diagnosis not present

## 2019-02-19 DIAGNOSIS — E1151 Type 2 diabetes mellitus with diabetic peripheral angiopathy without gangrene: Secondary | ICD-10-CM

## 2019-02-19 DIAGNOSIS — E1169 Type 2 diabetes mellitus with other specified complication: Secondary | ICD-10-CM | POA: Diagnosis not present

## 2019-02-19 NOTE — Progress Notes (Signed)
Subjective:  Patient ID: Tara [name], female    DOB: [birth year],  MRN: [id number]  Chief Complaint  Patient presents with  . Nail Problem    3 month diabetic nail trim    68 y.o. female presents  for diabetic foot care. Last A1c 6.0. Denies new complaints. Denies numbness and tingling in their feet. Denies cramping in legs and thighs.  Review of Systems: Negative except as noted in the HPI. Denies N/V/F/Ch.  Past Medical History:  Diagnosis Date  . Anemia   . ANKLE PAIN, LEFT 04/01/2008  . ANXIETY 04/17/2007  . Arthritis   . Chronic kidney disease    stage 3  . Colon polyp   . COLONIC POLYPS, HX OF 08/01/2007  . CONTUSIONS, MULTIPLE 04/01/2009  . DEPRESSION 04/17/2007  . DIZZINESS 08/01/2007  . DYSPNEA 08/01/2007   with exertion  . Enlargement of lymph nodes 08/13/2007  . Excessive involuntary blinking    per pt,going on since 2018  . GLUCOSE INTOLERANCE 08/01/2007  . 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 HYPERLIPIDEMIA 08/01/2007   no meds  . HYPERSOMNIA 07/28/2008  . HYPERTENSION 04/17/2007  . Impaired glucose tolerance 03/23/2011  . JOINT EFFUSION, LEFT KNEE 06/02/2010  . Loose body in knee 04/01/2009   pt not sure?  . Metabolic encephalopathy 37/90/2409-73/5329  . Migraines    "stopped 3-4 yr ago" (07/23/2012)  . Morbid obesity ([place]) 04/20/2007  . OTHER DISEASES OF LUNG NOT ELSEWHERE CLASSIFIED 08/01/2007  . Pain in joint, lower leg 04/01/2009  . PERIPHERAL EDEMA 04/21/2009  . Pre-diabetes   . Sarcoidosis 09/25/2007  . SHOULDER PAIN, LEFT 04/01/2009  . Sleep apnea    does not use Cpap    Current Outpatient Medications:  .  allopurinol (ZYLOPRIM) 100 MG tablet, TAKE 1 TABLET(100 MG) BY MOUTH DAILY, Disp: 90 tablet, Rfl: 1 .  amphetamine-dextroamphetamine (ADDERALL) 30 MG tablet, Take 30 mg by mouth 2 (two) times daily., Disp: , Rfl:  .  antiseptic oral rinse (BIOTENE) LIQD, 15 mLs by Mouth Rinse route 2 (two) times daily as needed for dry mouth., Disp: , Rfl:  .   aspirin EC 81 MG tablet, Take 81 mg by mouth daily., Disp: , Rfl:  .  atenolol (TENORMIN) 50 MG tablet, TAKE 1 TABLET(50 MG) BY MOUTH DAILY, Disp: 90 tablet, Rfl: 3 .  cloNIDine (CATAPRES) 0.1 MG tablet, TAKE 1 TABLET(0.1 MG) BY MOUTH TWICE DAILY, Disp: 180 tablet, Rfl: 1 .  clorazepate (TRANXENE) 7.5 MG tablet, Take 7.5 mg by mouth 3 (three) times daily., Disp: , Rfl:  .  desvenlafaxine (PRISTIQ) 100 MG 24 hr tablet, Take 100 mg by mouth at bedtime., Disp: , Rfl:  .  diclofenac sodium (VOLTAREN) 1 % GEL, APP 1 GRAM EXT AA BID, Disp: , Rfl:  .  DILT-XR 240 MG 24 hr capsule, TAKE 1 CAPSULE(240 MG) BY MOUTH DAILY, Disp: 90 capsule, Rfl: 1 .  ferrous sulfate 325 (65 FE) MG tablet, Take 325 mg by mouth daily with breakfast., Disp: , Rfl:  .  fluticasone furoate-vilanterol (BREO ELLIPTA) 100-25 MCG/INH AEPB, Inhale 1 puff into the lungs daily. (Patient not taking: Reported on 09/08/2018), Disp: 1 each, Rfl: 0 .  furosemide (LASIX) 40 MG tablet, Take 1 tablet (40 mg total) by mouth 2 (two) times daily. (Patient taking differently: Take 40 mg by mouth daily. ), Disp: 30 tablet, Rfl: 0 .  hydrALAZINE (APRESOLINE) 100 MG tablet, Take 1 tablet (100 mg total) by mouth every 8 (eight) hours. (Patient taking differently:  Take 100 mg by mouth 3 (three) times daily. ), Disp: , Rfl:  .  latanoprost (XALATAN) 0.005 % ophthalmic solution, Place 1 drop into both eyes at bedtime., Disp: , Rfl:  .  Melatonin 5 MG TABS, Take 10 mg by mouth at bedtime., Disp: , Rfl:  .  meloxicam (MOBIC) 15 MG tablet, TK 1 T PO D WF, Disp: , Rfl:  .  Multiple Vitamins-Minerals (MULTIVITAMIN WITH MINERALS) tablet, Take 1 tablet by mouth daily., Disp: , Rfl:  .  Omega 3 1200 MG CAPS, Take 2,400 mg by mouth at bedtime., Disp: , Rfl:  .  timolol (TIMOPTIC) 0.5 % ophthalmic solution, Place 1 drop into both eyes 2 (two) times daily., Disp: , Rfl: 0 .  UNABLE TO FIND, Med Name: Special blend of serum eye drops made at [facility]., Disp: , Rfl:  .   zaleplon (SONATA) 10 MG capsule, Take 10 mg by mouth at bedtime as needed for sleep., Disp: , Rfl:   Current Facility-Administered Medications:  .  0.9 %  sodium chloride infusion, 500 mL, Intravenous, Once, Nandigam, Venia Minks, MD  Social History   Tobacco Use  Smoking Status Never Smoker  Smokeless Tobacco Never Used    Allergies  Allergen Reactions  . Fluoxetine Hcl Other (See Comments)    (PROZAC) Suicidal thoughts  . Chocolate Other (See Comments)    SOMETIMES CAUSES SEVERE HEADACHES  . Floxin [Ocuflox] Anxiety    shaky  . Other Nausea And Vomiting    INSTANT ICED TEA PACKETS  . Sulfonamide Derivatives Rash   Objective:  There were no vitals filed for this visit. There is no height or weight on file to calculate BMI. Constitutional Well developed. Well nourished.  Vascular Dorsalis pedis pulses present 1+ bilaterally  Posterior tibial pulses absent bilaterally  Pedal hair growth absent. Capillary refill normal to all digits.  No cyanosis or clubbing noted.  Neurologic Normal speech. Oriented to person, place, and time. Epicritic sensation to light touch grossly present bilaterally. Protective sensation with 5.07 monofilament  present bilaterally. Vibratory sensation present bilaterally.  Dermatologic Nails elongated, thickened, dystrophic. No open wounds. No skin lesions.  Orthopedic: Normal joint ROM without pain or crepitus bilaterally. No visible deformities. No bony tenderness.   Assessment:   1. Onychomycosis of multiple toenails with type 2 diabetes mellitus and peripheral angiopathy ([place])    Plan:  Patient was evaluated and treated and all questions answered.  Diabetes with PAD, Onychomycosis -Routine foot care as below  Procedure: Nail Debridement Rationale: Patient meets criteria for routine foot care due to PAD Type of Debridement: manual, sharp debridement. Instrumentation: Nail nipper, rotary burr. Number of Nails: 10      No follow-ups  on file.

## 2019-02-25 ENCOUNTER — Other Ambulatory Visit: Payer: Self-pay | Admitting: Internal Medicine

## 2019-03-07 ENCOUNTER — Other Ambulatory Visit (HOSPITAL_COMMUNITY)
Admission: RE | Admit: 2019-03-07 | Discharge: 2019-03-07 | Disposition: A | Discharge status: Home / Self Care | Payer: Commercial Managed Care - PPO | Source: Ambulatory Visit | Attending: Internal Medicine | Admitting: Internal Medicine

## 2019-03-07 DIAGNOSIS — Z20828 Contact with and (suspected) exposure to other viral communicable diseases: Secondary | ICD-10-CM | POA: Insufficient documentation

## 2019-03-07 DIAGNOSIS — Z01812 Encounter for preprocedural laboratory examination: Secondary | ICD-10-CM | POA: Diagnosis present

## 2019-03-07 LAB — SARS CORONAVIRUS 2 (TAT 6-24 HRS): SARS Coronavirus 2: NEGATIVE

## 2019-03-09 ENCOUNTER — Encounter: Payer: Self-pay | Admitting: Internal Medicine

## 2019-03-09 ENCOUNTER — Ambulatory Visit (INDEPENDENT_AMBULATORY_CARE_PROVIDER_SITE_OTHER): Payer: Commercial Managed Care - PPO | Admitting: Internal Medicine

## 2019-03-09 DIAGNOSIS — J309 Allergic rhinitis, unspecified: Secondary | ICD-10-CM | POA: Diagnosis not present

## 2019-03-09 DIAGNOSIS — E785 Hyperlipidemia, unspecified: Secondary | ICD-10-CM

## 2019-03-09 DIAGNOSIS — Z0001 Encounter for general adult medical examination with abnormal findings: Secondary | ICD-10-CM | POA: Diagnosis not present

## 2019-03-09 DIAGNOSIS — E099 Drug or chemical induced diabetes mellitus without complications: Secondary | ICD-10-CM | POA: Diagnosis not present

## 2019-03-09 DIAGNOSIS — E538 Deficiency of other specified B group vitamins: Secondary | ICD-10-CM

## 2019-03-09 DIAGNOSIS — F32A Depression, unspecified: Secondary | ICD-10-CM

## 2019-03-09 DIAGNOSIS — F329 Major depressive disorder, single episode, unspecified: Secondary | ICD-10-CM

## 2019-03-09 DIAGNOSIS — T380X5A Adverse effect of glucocorticoids and synthetic analogues, initial encounter: Secondary | ICD-10-CM | POA: Diagnosis not present

## 2019-03-09 DIAGNOSIS — E611 Iron deficiency: Secondary | ICD-10-CM

## 2019-03-09 DIAGNOSIS — E559 Vitamin D deficiency, unspecified: Secondary | ICD-10-CM

## 2019-03-09 MED ORDER — CETIRIZINE HCL 10 MG PO TABS: 10.0000 mg | ORAL_TABLET | Freq: Every day | ORAL | 11 refills | Status: DC

## 2019-03-09 NOTE — Patient Instructions (Addendum)
OK to restart the nasacort at home  Please take all new medication as prescribed - the zyrtec  Please continue all other medications as before, and refills have been done if requested.  Please have the pharmacy call with any other refills you may need.  Please continue your efforts at being more active, low cholesterol diet, and weight control.  You are otherwise up to date with prevention measures today.  Please keep your appointments with your specialists as you may have planned  Please go to the LAB in the Basement (turn left off the elevator) for the tests to be done  You will be contacted by phone if any changes need to be made immediately.  Otherwise, you will receive a letter about your results with an explanation, but please check with MyChart first  Please remember to sign up for MyChart if you have not done so, as this will be important to you in the future with finding out test results, communicating by private email, and scheduling acute appointments online when needed.  Please return in 6 months, or sooner if needed, with Lab testing done 3-5 days before

## 2019-03-09 NOTE — Progress Notes (Signed)
Patient ID: Tara [name], female   DOB: [birth year], 68 y.o.   MRN: [id number]  Virtual Visit via Video Note  I connected with Tara [name] on 03/09/19 at  2:20 PM EDT by a video enabled telemedicine application and verified that I am speaking with the correct person using two identifiers.  Location: Patient: at home Provider: at office   I discussed the limitations of evaluation and management by telemedicine and the availability of in person appointments. The patient expressed understanding and agreed to proceed.  History of Present Illness: Here for wellness and f/u;  Overall doing ok;  Pt denies Chest pain, worsening SOB, DOE, wheezing, orthopnea, PND, worsening LE edema, palpitations, dizziness or syncope.  Pt denies neurological change such as new headache, facial or extremity weakness.  Pt denies polydipsia, polyuria, or low sugar symptoms. Pt states overall good compliance with treatment and medications, good tolerability, and has been trying to follow appropriate diet.  Pt denies worsening depressive symptoms, suicidal ideation or panic. No fever, night sweats, wt loss, loss of appetite, or other constitutional symptoms.  Pt states good ability with ADL's, has low [name] risk, home safety reviewed and adequate, no other significant changes in hearing or vision, and only occasionally active with exercise. Has appt in Nov for f/u pap and mammoram.  Sees optho and podiatry on regular basis,  Here with increased HA, has seen HA wellness in past for migraine but thinks may be more related to dry eye for which she has been getting botox per optho.  HA start the cheek bones, seems to start mostly in the AM, then develops to the head and is different for migraine in the past which was one sided throbbing type.  Has some stuffiness to the nose, has not tried any otc meds, has nasacort, but willing to try the zyrtec too.  Had COVID testing pre PFT, results pending.   Past Medical History:  Diagnosis Date   . Anemia   . ANKLE PAIN, LEFT 04/01/2008  . ANXIETY 04/17/2007  . Arthritis   . Chronic kidney disease    stage 3  . Colon polyp   . COLONIC POLYPS, HX OF 08/01/2007  . CONTUSIONS, MULTIPLE 04/01/2009  . DEPRESSION 04/17/2007  . DIZZINESS 08/01/2007  . DYSPNEA 08/01/2007   with exertion  . Enlargement of lymph nodes 08/13/2007  . Excessive involuntary blinking    per pt,going on since 2018  . GLUCOSE INTOLERANCE 08/01/2007  . 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 HYPERLIPIDEMIA 08/01/2007   no meds  . HYPERSOMNIA 07/28/2008  . HYPERTENSION 04/17/2007  . Impaired glucose tolerance 03/23/2011  . JOINT EFFUSION, LEFT KNEE 06/02/2010  . Loose body in knee 04/01/2009   pt not sure?  . Metabolic encephalopathy 76/16/0737-04/6268  . Migraines    "stopped 3-4 yr ago" (07/23/2012)  . Morbid obesity ([place]) 04/20/2007  . OTHER DISEASES OF LUNG NOT ELSEWHERE CLASSIFIED 08/01/2007  . Pain in joint, lower leg 04/01/2009  . PERIPHERAL EDEMA 04/21/2009  . Pre-diabetes   . Sarcoidosis 09/25/2007  . SHOULDER PAIN, LEFT 04/01/2009  . Sleep apnea    does not use Cpap   Past Surgical History:  Procedure Laterality Date  . BREAST SURGERY     Biopsy benign/bil breaST  . CATARACT EXTRACTION     RIGHT EYE  . COLONOSCOPY    . COMBINED MEDIASTINOSCOPY AND BRONCHOSCOPY  08/2007  . COMBINED MEDIASTINOSCOPY AND BRONCHOSCOPY  2009  . Dental implant    . FRACTURE SURGERY  ?02/1997   "  left upper arm; put rod in" (07/23/2012)  . GUM SURGERY  2000-?2009   "several ORs; soft tissue graft; took material from roof of mouth" (07/23/2012  . HUMERUS SURGERY Left 1998   rod insertion  . KNEE ARTHROSCOPY  03/2004; 06/2009   "right; left" Dr. Theda Sers  . KNEE SURGERY  2012   ARTHROSCOPIC LEFT KNEE  . LYMPH NODE BIOPSY  ~ 2009   "for sarcoidosis; don't know exactly which nodes" (07/23/2102)  . MYOMECTOMY  1994   Open  . REFRACTIVE SURGERY  08/1998   "both eyes" (07/23/2012)  . REFRACTIVE SURGERY  2000   Bil  . TOTAL KNEE  ARTHROPLASTY Left 09/24/2016   Procedure: TOTAL KNEE ARTHROPLASTY;  Surgeon: Dorna Leitz, MD;  Location: [facility];  Service: Orthopedics;  Laterality: Left;    reports that she has never smoked. She has never used smokeless tobacco. She reports current alcohol use. She reports that she does not use drugs. family history includes Asthma in her sister; Diabetes in her mother; Gout in her sister; Heart disease (age of onset: 78) in her father; Hypertension in her father, mother, and sister; Stroke in her mother. Allergies  Allergen Reactions  . Fluoxetine Hcl Other (See Comments)    (PROZAC) Suicidal thoughts  . Chocolate Other (See Comments)    SOMETIMES CAUSES SEVERE HEADACHES  . Floxin [Ocuflox] Anxiety    shaky  . Other Nausea And Vomiting    INSTANT ICED TEA PACKETS  . Sulfonamide Derivatives Rash   Current Outpatient Medications on File Prior to Visit  Medication Sig Dispense Refill  . allopurinol (ZYLOPRIM) 100 MG tablet TAKE 1 TABLET(100 MG) BY MOUTH DAILY 90 tablet 1  . amphetamine-dextroamphetamine (ADDERALL) 30 MG tablet Take 30 mg by mouth 2 (two) times daily.    Marland Kitchen antiseptic oral rinse (BIOTENE) LIQD 15 mLs by Mouth Rinse route 2 (two) times daily as needed for dry mouth.    Marland Kitchen aspirin EC 81 MG tablet Take 81 mg by mouth daily.    Marland Kitchen atenolol (TENORMIN) 50 MG tablet TAKE 1 TABLET(50 MG) BY MOUTH DAILY 90 tablet 3  . cloNIDine (CATAPRES) 0.1 MG tablet TAKE 1 TABLET(0.1 MG) BY MOUTH TWICE DAILY 180 tablet 1  . clorazepate (TRANXENE) 7.5 MG tablet Take 7.5 mg by mouth 3 (three) times daily.    Marland Kitchen desvenlafaxine (PRISTIQ) 100 MG 24 hr tablet Take 100 mg by mouth at bedtime.    . diclofenac sodium (VOLTAREN) 1 % GEL APP 1 GRAM EXT AA BID    . DILT-XR 240 MG 24 hr capsule TAKE 1 CAPSULE(240 MG) BY MOUTH DAILY 90 capsule 1  . ferrous sulfate 325 (65 FE) MG tablet Take 325 mg by mouth daily with breakfast.    . fluticasone furoate-vilanterol (BREO ELLIPTA) 100-25 MCG/INH AEPB Inhale 1 puff  into the lungs daily. (Patient not taking: Reported on 09/08/2018) 1 each 0  . furosemide (LASIX) 40 MG tablet Take 1 tablet (40 mg total) by mouth 2 (two) times daily. (Patient taking differently: Take 40 mg by mouth daily. ) 30 tablet 0  . hydrALAZINE (APRESOLINE) 100 MG tablet Take 1 tablet (100 mg total) by mouth every 8 (eight) hours. (Patient taking differently: Take 100 mg by mouth 3 (three) times daily. )    . latanoprost (XALATAN) 0.005 % ophthalmic solution Place 1 drop into both eyes at bedtime.    . Melatonin 5 MG TABS Take 10 mg by mouth at bedtime.    . meloxicam (MOBIC) 15 MG  tablet TK 1 T PO D WF    . Multiple Vitamins-Minerals (MULTIVITAMIN WITH MINERALS) tablet Take 1 tablet by mouth daily.    . Omega 3 1200 MG CAPS Take 2,400 mg by mouth at bedtime.    . timolol (TIMOPTIC) 0.5 % ophthalmic solution Place 1 drop into both eyes 2 (two) times daily.  0  . UNABLE TO FIND Med Name: Special blend of serum eye drops made at [facility].    . zaleplon (SONATA) 10 MG capsule Take 10 mg by mouth at bedtime as needed for sleep.     Current Facility-Administered Medications on File Prior to Visit  Medication Dose Route Frequency Provider Last Rate Last Dose  . 0.9 %  sodium chloride infusion  500 mL Intravenous Once Mauri Pole, MD        Observations/Objective: Alert, NAD, appropriate mood and affect, resps normal, cn 2-12 intact, moves all 4s, no visible rash or swelling Lab Results  Component Value Date   WBC 6.6 08/18/2018   HGB 12.8 08/18/2018   HCT 38.1 08/18/2018   PLT 208.0 08/18/2018   GLUCOSE 99 08/18/2018   CHOL 135 08/18/2018   TRIG 177.0 (H) 08/18/2018   HDL 42.20 08/18/2018   LDLDIRECT 50.0 09/24/2017   LDLCALC 58 08/18/2018   ALT 23 08/18/2018   AST 19 08/18/2018   NA 136 08/18/2018   K 3.3 (L) 08/18/2018   CL 99 08/18/2018   CREATININE 1.26 (H) 08/18/2018   BUN 26 (H) 08/18/2018   CO2 31 08/18/2018   TSH 2.41 08/18/2018   INR 0.98 09/17/2016   HGBA1C  6.0 08/18/2018   MICROALBUR <0.7 09/24/2017    Assessment and Plan: See notes  Follow Up Instructions: See notes   I discussed the assessment and treatment plan with the patient. The patient was provided an opportunity to ask questions and all were answered. The patient agreed with the plan and demonstrated an understanding of the instructions.   The patient was advised to call back or seek an in-person evaluation if the symptoms worsen or if the condition fails to improve as anticipated.   Cathlean Cower, MD

## 2019-03-09 NOTE — Assessment & Plan Note (Signed)

## 2019-03-09 NOTE — Assessment & Plan Note (Signed)
stable overall by history and exam, recent data reviewed with pt, and pt to continue medical treatment as before,  to f/u any worsening symptoms or concerns  

## 2019-03-09 NOTE — Assessment & Plan Note (Signed)
stable overall by history and exam, recent data reviewed with pt, and pt to continue medical treatment as before,  to f/u any worsening symptoms or concerns, for lab f/u 

## 2019-03-09 NOTE — Assessment & Plan Note (Addendum)
Mild to mod, for add zyrtec 10 qd prn,  to f/u any worsening symptoms or concerns  In addition to the time spent performing CPE, I spent an additional 25 minutes face to face,in which greater than 50% of this time was spent in counseling and coordination of care for patient's acute illness as documented, including the differential dx, treatment, further evaluation and other management of allergic rhinitis, DM, HLD, depression

## 2019-03-11 ENCOUNTER — Encounter: Payer: Self-pay | Admitting: Adult Health

## 2019-03-11 ENCOUNTER — Ambulatory Visit: Payer: Self-pay | Admitting: Internal Medicine

## 2019-03-11 ENCOUNTER — Other Ambulatory Visit: Payer: Self-pay

## 2019-03-11 ENCOUNTER — Ambulatory Visit (INDEPENDENT_AMBULATORY_CARE_PROVIDER_SITE_OTHER): Payer: Commercial Managed Care - PPO | Admitting: Internal Medicine

## 2019-03-11 ENCOUNTER — Ambulatory Visit (INDEPENDENT_AMBULATORY_CARE_PROVIDER_SITE_OTHER): Payer: Commercial Managed Care - PPO | Admitting: Adult Health

## 2019-03-11 DIAGNOSIS — R0609 Other forms of dyspnea: Secondary | ICD-10-CM

## 2019-03-11 DIAGNOSIS — R0600 Dyspnea, unspecified: Secondary | ICD-10-CM

## 2019-03-11 DIAGNOSIS — G4733 Obstructive sleep apnea (adult) (pediatric): Secondary | ICD-10-CM

## 2019-03-11 DIAGNOSIS — Z23 Encounter for immunization: Secondary | ICD-10-CM

## 2019-03-11 DIAGNOSIS — J309 Allergic rhinitis, unspecified: Secondary | ICD-10-CM | POA: Diagnosis not present

## 2019-03-11 DIAGNOSIS — D869 Sarcoidosis, unspecified: Secondary | ICD-10-CM | POA: Diagnosis not present

## 2019-03-11 LAB — PULMONARY FUNCTION TEST
DL/VA % pred: 116 %
DL/VA: 4.79 ml/min/mmHg/L
DLCO unc % pred: 93 %
DLCO unc: 19.68 ml/min/mmHg
FEF 25-75 Post: 2.7 L/sec
FEF 25-75 Pre: 1.68 L/sec
FEF2575-%Change-Post: 60 %
FEF2575-%Pred-Post: 122 %
FEF2575-%Pred-Pre: 76 %
FEV1-%Change-Post: 7 %
FEV1-%Pred-Post: 78 %
FEV1-%Pred-Pre: 72 %
FEV1-Post: 2.02 L
FEV1-Pre: 1.87 L
FEV1FVC-%Change-Post: 9 %
FEV1FVC-%Pred-Pre: 103 %
FEV6-%Change-Post: 0 %
FEV6-%Pred-Post: 72 %
FEV6-%Pred-Pre: 72 %
FEV6-Post: 2.34 L
FEV6-Pre: 2.34 L
FEV6FVC-%Change-Post: 1 %
FEV6FVC-%Pred-Post: 104 %
FEV6FVC-%Pred-Pre: 102 %
FVC-%Change-Post: -1 %
FVC-%Pred-Post: 69 %
FVC-%Pred-Pre: 70 %
FVC-Post: 2.34 L
FVC-Pre: 2.36 L
Post FEV1/FVC ratio: 86 %
Post FEV6/FVC ratio: 100 %
Pre FEV1/FVC ratio: 79 %
Pre FEV6/FVC Ratio: 99 %
RV % pred: 100 %
RV: 2.21 L
TLC % pred: 84 %
TLC: 4.53 L

## 2019-03-11 NOTE — Progress Notes (Signed)
_0  ID: Tara [name], female    DOB: 30-Sep-1950, 68 y.o.   MRN: [id number]  Chief Complaint  Patient presents with  . Follow-up    Dyspnea     Referring provider: Biagio Borg, MD  HPI: 68 year old female never smoker followed for sarcoidosis complicated by hypercalcemia and renal insufficiency and glaucoma. Has obstructive sleep apnea intolerant to CPAP  TEST/EVENTS :  PFT 02/07/15-minimal obstructive airways disease, minimal diffusion defect, insignificant response to bronchodilator ACE in July, 2017 remained elevated 74 (8-52) NPSG  12/ 11/09 - AhI 1.4/hour I desaturation to 89%, body weight 245 lbs Unattended Home Sleep Test 03/12/17-AHI 27.6/hour, desaturation to 75%, body weight 220 pounds  03/11/2019 Follow up : Sarcoid , Dyspnea  Patient returns for a 48-monthfollow-up.  Patient has underlying sarcoidosis this is complicated by hypercalcemia and renal insufficiency.  She has glaucoma.   Last visit was having increased shortness of breath.  CT chest showed negative PE.  Mild cardiomegaly.  No mediastinal or hilar adenopathy.  Minimal dependent atelectasis. She had an intermittent cough.  She was given a trial of Breo inhaler.  Recommended on GERD treatment for possible LPR component.  ACE level was decreased at 60 ESR 13 proBNP mildly elevated at 221.  Previous 2D echo in 2017 showed preserved EF at 60 to 65% mild LVH.  Calcium was normal February 2020. Patient had PFTs done today that showed moderate restriction with no airflow obstruction.  FEV1 78%, ratio 86, FVC 69%, no significant bronchodilator response, significant mid flow reversibility, DLCO 93% Since last visit she is feeling well with no significant breathing issues.  She denies any shortness of breath, or wheezing.  Says she never used the BREO .  Says cough is better, has rare occasional cough.   Is following with PCP for headaches. Felt to be sinus or allergic in nature. Was recommended to start on Zyrtec but  has not started yet. Nasal dryness, occasional drainage .  Has OSA . Intolerant to CPAP . Has oral appliance but does not use.   Has chronic dry eyes , got botox injections. Care everywhere shows neg Sjogrens panel.   Lost 30lbs , eating healthier. Walking 10,000 each day.   Allergies  Allergen Reactions  . Fluoxetine Hcl Other (See Comments)    (PROZAC) Suicidal thoughts  . Chocolate Other (See Comments)    SOMETIMES CAUSES SEVERE HEADACHES  . Floxin [Ocuflox] Anxiety    shaky  . Other Nausea And Vomiting    INSTANT ICED TEA PACKETS  . Sulfonamide Derivatives Rash    Immunization History  Administered Date(s) Administered  . Fluad Quad(high Dose 65+) 03/11/2019  . Influenza Split 03/29/2013  . Influenza Whole 05/09/1999, 05/19/2007, 04/01/2008, 04/21/2009, 03/26/2011  . Influenza, High Dose Seasonal PF 03/27/2018  . Influenza,inj,Quad PF,6+ Mos 03/25/2014, 03/27/2016, 06/13/2017  . Influenza-Unspecified 04/15/2012, 04/14/2015  . Pneumococcal Polysaccharide-23 06/05/2013  . Td 07/16/1992, 10/21/2008  . Zoster 12/14/2012    Past Medical History:  Diagnosis Date  . Anemia   . ANKLE PAIN, LEFT 04/01/2008  . ANXIETY 04/17/2007  . Arthritis   . Chronic kidney disease    stage 3  . Colon polyp   . COLONIC POLYPS, HX OF 08/01/2007  . CONTUSIONS, MULTIPLE 04/01/2009  . DEPRESSION 04/17/2007  . DIZZINESS 08/01/2007  . DYSPNEA 08/01/2007   with exertion  . Enlargement of lymph nodes 08/13/2007  . Excessive involuntary blinking    per pt,going on since 2018  . GLUCOSE INTOLERANCE 08/01/2007  .  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 HYPERLIPIDEMIA 08/01/2007   no meds  . HYPERSOMNIA 07/28/2008  . HYPERTENSION 04/17/2007  . Impaired glucose tolerance 03/23/2011  . JOINT EFFUSION, LEFT KNEE 06/02/2010  . Loose body in knee 04/01/2009   pt not sure?  . Metabolic encephalopathy 97/35/3299-24/2683  . Migraines    "stopped 3-4 yr ago" (07/23/2012)  . Morbid obesity ([place]) 04/20/2007   . OTHER DISEASES OF LUNG NOT ELSEWHERE CLASSIFIED 08/01/2007  . Pain in joint, lower leg 04/01/2009  . PERIPHERAL EDEMA 04/21/2009  . Pre-diabetes   . Sarcoidosis 09/25/2007  . SHOULDER PAIN, LEFT 04/01/2009  . Sleep apnea    does not use Cpap    Tobacco History: Social History   Tobacco Use  Smoking Status Never Smoker  Smokeless Tobacco Never Used   Counseling given: Not Answered   Outpatient Medications Prior to Visit  Medication Sig Dispense Refill  . allopurinol (ZYLOPRIM) 100 MG tablet TAKE 1 TABLET(100 MG) BY MOUTH DAILY 90 tablet 1  . amphetamine-dextroamphetamine (ADDERALL) 30 MG tablet Take 30 mg by mouth 2 (two) times daily.    Marland Kitchen antiseptic oral rinse (BIOTENE) LIQD 15 mLs by Mouth Rinse route 2 (two) times daily as needed for dry mouth.    Marland Kitchen aspirin EC 81 MG tablet Take 81 mg by mouth daily.    Marland Kitchen atenolol (TENORMIN) 50 MG tablet TAKE 1 TABLET(50 MG) BY MOUTH DAILY 90 tablet 3  . cetirizine (ZYRTEC) 10 MG tablet Take 1 tablet (10 mg total) by mouth daily. 30 tablet 11  . cloNIDine (CATAPRES) 0.1 MG tablet TAKE 1 TABLET(0.1 MG) BY MOUTH TWICE DAILY 180 tablet 1  . clorazepate (TRANXENE) 7.5 MG tablet Take 7.5 mg by mouth 3 (three) times daily.    Marland Kitchen desvenlafaxine (PRISTIQ) 100 MG 24 hr tablet Take 100 mg by mouth at bedtime.    . diclofenac sodium (VOLTAREN) 1 % GEL APP 1 GRAM EXT AA BID    . DILT-XR 240 MG 24 hr capsule TAKE 1 CAPSULE(240 MG) BY MOUTH DAILY 90 capsule 1  . ferrous sulfate 325 (65 FE) MG tablet Take 325 mg by mouth daily with breakfast.    . fluticasone furoate-vilanterol (BREO ELLIPTA) 100-25 MCG/INH AEPB Inhale 1 puff into the lungs daily. 1 each 0  . furosemide (LASIX) 40 MG tablet Take 1 tablet (40 mg total) by mouth 2 (two) times daily. (Patient taking differently: Take 40 mg by mouth daily. ) 30 tablet 0  . hydrALAZINE (APRESOLINE) 100 MG tablet Take 1 tablet (100 mg total) by mouth every 8 (eight) hours. (Patient taking differently: Take 100 mg by  mouth 3 (three) times daily. )    . latanoprost (XALATAN) 0.005 % ophthalmic solution Place 1 drop into both eyes at bedtime.    . Melatonin 5 MG TABS Take 10 mg by mouth at bedtime.    . meloxicam (MOBIC) 15 MG tablet TK 1 T PO D WF    . Multiple Vitamins-Minerals (MULTIVITAMIN WITH MINERALS) tablet Take 1 tablet by mouth daily.    . Omega 3 1200 MG CAPS Take 2,400 mg by mouth at bedtime.    . timolol (TIMOPTIC) 0.5 % ophthalmic solution Place 1 drop into both eyes 2 (two) times daily.  0  . UNABLE TO FIND Med Name: Special blend of serum eye drops made at [facility].    . zaleplon (SONATA) 10 MG capsule Take 10 mg by mouth at bedtime as needed for sleep.     Facility-Administered  Medications Prior to Visit  Medication Dose Route Frequency Provider Last Rate Last Dose  . 0.9 %  sodium chloride infusion  500 mL Intravenous Once Nandigam, Kavitha V, MD         Review of Systems:   Constitutional:   No  weight loss, night sweats,  Fevers, chills,  +fatigue, or  lassitude.  HEENT:  + headaches,  Difficulty swallowing,  Tooth/dental problems, or  Sore throat,                No sneezing, itching, ear ache, nasal congestion, post nasal drip,  No speech/visual change, extremity weakness.   CV:  No chest pain,  Orthopnea, PND, swelling in lower extremities, anasarca, dizziness, palpitations, syncope.   GI  No heartburn, indigestion, abdominal pain, nausea, vomiting, diarrhea, change in bowel habits, loss of appetite, bloody stools.   Resp: No shortness of breath with exertion or at rest.  No excess mucus, no productive cough,  No non-productive cough,  No coughing up of blood.  No change in color of mucus.  No wheezing.  No chest wall deformity  Skin: no rash or lesions.  GU: no dysuria, change in color of urine, no urgency or frequency.  No flank pain, no hematuria   MS:  No joint pain or swelling.  No decreased range of motion.  No back pain.    Physical Exam  BP 124/78   Pulse (!)  53   Temp (!) 97.2 F (36.2 C) (Temporal)   Ht _0  (1.676 m)   Wt 204 lb (92.5 kg)   SpO2 98%   BMI 32.93 kg/m   GEN: A/Ox3; pleasant , NAD, elderly , obese    HEENT:  [place]/AT,   NOSE-clear, THROAT-clear, no lesions, no postnasal drip or exudate noted.   NECK:  Supple w/ fair ROM; no JVD; normal carotid impulses w/o bruits; no thyromegaly or nodules palpated; no lymphadenopathy.    RESP  Clear  P & A; w/o, wheezes/ rales/ or rhonchi. no accessory muscle use, no dullness to percussion  CARD:  RRR, no m/r/g, no peripheral edema, pulses intact, no cyanosis or clubbing.  GI:   Soft & nt; nml bowel sounds; no organomegaly or masses detected.   Musco: Warm bil, no deformities or joint swelling noted.   Neuro: alert, no focal deficits noted.  Facial features symmetrical , maew x 4   Skin: Warm, no lesions or rashes    Lab Results:   BMET  Imaging: No results found.    PFT Results Latest Ref Rng & Units 03/11/2019 02/07/2015  FVC-Pre L 2.36 2.59  FVC-Predicted Pre % 70 74  FVC-Post L 2.34 2.55  FVC-Predicted Post % 69 73  Pre FEV1/FVC % % 79 80  Post FEV1/FCV % % 86 86  FEV1-Pre L 1.87 2.08  FEV1-Predicted Pre % 72 77  FEV1-Post L 2.02 2.19  DLCO UNC% % 93 78  DLCO COR %Predicted % 116 104  TLC L 4.53 4.35  TLC % Predicted % 84 81  RV % Predicted % 100 55    No results found for: NITRICOXIDE      Assessment & Plan:   Sarcoidosis ([place]) Sarcoidosis-appears to be an active.  CT chest February 2020 showed no mediastinal or hilar adenopathy.  Minimal basilar atelectasis.  PFT today shows stable lung function and DLCO.  Moderate restriction with FEV1 at 78%, ratio 86, FVC 69% DLCO 93%. May have a component of mild intermittent asthma/reactive airways with significant  small airways reversibility (no significant airflow obstruction) Currently with no active symptoms of cough or shortness of breath. Would discontinue Breo.  Can use pro-air as needed for rescue inhaler  Congratulated on increased activity and weight loss along with healthy diet  Plan  Patient Instructions  Restart Oral Appliance At bedtime  . Try to wear all night .  Healthy sleep regimen .  Saline nasal rinses Twice daily   Saline nasal gel At bedtime   Activity as tolerated.  Albuterol Inhaler 2 puffs every 4hrs as needed -this is your rescue inhaler .  Flu shot today  Follow up with Dr. Annamaria Boots  In 6 months and As needed        Obstructive sleep apnea Moderate obstructive sleep apnea CPAP intolerant.  Patient is encouraged on oral appliance usage.  Patient education on sleep apnea and potential complications of untreated sleep apnea.  Weight loss encouraged  Plan  Patient Instructions  Restart Oral Appliance At bedtime  . Try to wear all night .  Healthy sleep regimen .  Saline nasal rinses Twice daily   Saline nasal gel At bedtime   Activity as tolerated.  Albuterol Inhaler 2 puffs every 4hrs as needed -this is your rescue inhaler .  Flu shot today  Follow up with Dr. Annamaria Boots  In 6 months and As needed        Allergic rhinitis Add saline rinses and gel  Plan  Patient Instructions  Restart Oral Appliance At bedtime  . Try to wear all night .  Healthy sleep regimen .  Saline nasal rinses Twice daily   Saline nasal gel At bedtime   Activity as tolerated.  Albuterol Inhaler 2 puffs every 4hrs as needed -this is your rescue inhaler .  Flu shot today  Follow up with Dr. Annamaria Boots  In 6 months and As needed        Morbid obesity Healthy weight loss     Rexene Edison, NP 03/11/2019

## 2019-03-11 NOTE — Patient Instructions (Addendum)
Restart Oral Appliance At bedtime  . Try to wear all night .  Healthy sleep regimen .  Saline nasal rinses Twice daily   Saline nasal gel At bedtime   Activity as tolerated.  Albuterol Inhaler 2 puffs every 4hrs as needed -this is your rescue inhaler .  Flu shot today  Follow up with Dr. Annamaria Boots  In 6 months and As needed

## 2019-03-11 NOTE — Assessment & Plan Note (Signed)
Sarcoidosis-appears to be an active.  CT chest February 2020 showed no mediastinal or hilar adenopathy.  Minimal basilar atelectasis.  PFT today shows stable lung function and DLCO.  Moderate restriction with FEV1 at 78%, ratio 86, FVC 69% DLCO 93%. May have a component of mild intermittent asthma/reactive airways with significant small airways reversibility (no significant airflow obstruction) Currently with no active symptoms of cough or shortness of breath. Would discontinue Breo.  Can use pro-air as needed for rescue inhaler Congratulated on increased activity and weight loss along with healthy diet  Plan  Patient Instructions  Restart Oral Appliance At bedtime  . Try to wear all night .  Healthy sleep regimen .  Saline nasal rinses Twice daily   Saline nasal gel At bedtime   Activity as tolerated.  Albuterol Inhaler 2 puffs every 4hrs as needed -this is your rescue inhaler .  Flu shot today  Follow up with Dr. Annamaria Boots  In 6 months and As needed

## 2019-03-11 NOTE — Assessment & Plan Note (Signed)
Moderate obstructive sleep apnea CPAP intolerant.  Patient is encouraged on oral appliance usage.  Patient education on sleep apnea and potential complications of untreated sleep apnea.  Weight loss encouraged  Plan  Patient Instructions  Restart Oral Appliance At bedtime  . Try to wear all night .  Healthy sleep regimen .  Saline nasal rinses Twice daily   Saline nasal gel At bedtime   Activity as tolerated.  Albuterol Inhaler 2 puffs every 4hrs as needed -this is your rescue inhaler .  Flu shot today  Follow up with Dr. Annamaria Boots  In 6 months and As needed

## 2019-03-11 NOTE — Progress Notes (Signed)
PFT done today. 

## 2019-03-11 NOTE — Assessment & Plan Note (Signed)
Add saline rinses and gel  Plan  Patient Instructions  Restart Oral Appliance At bedtime  . Try to wear all night .  Healthy sleep regimen .  Saline nasal rinses Twice daily   Saline nasal gel At bedtime   Activity as tolerated.  Albuterol Inhaler 2 puffs every 4hrs as needed -this is your rescue inhaler .  Flu shot today  Follow up with Dr. Annamaria Boots  In 6 months and As needed

## 2019-03-11 NOTE — Assessment & Plan Note (Signed)
Healthy weight loss 

## 2019-03-13 ENCOUNTER — Other Ambulatory Visit: Payer: Self-pay | Admitting: Internal Medicine

## 2019-03-13 ENCOUNTER — Telehealth: Payer: Self-pay

## 2019-03-13 ENCOUNTER — Other Ambulatory Visit (INDEPENDENT_AMBULATORY_CARE_PROVIDER_SITE_OTHER): Payer: Commercial Managed Care - PPO

## 2019-03-13 DIAGNOSIS — E538 Deficiency of other specified B group vitamins: Secondary | ICD-10-CM | POA: Diagnosis not present

## 2019-03-13 DIAGNOSIS — E611 Iron deficiency: Secondary | ICD-10-CM | POA: Diagnosis not present

## 2019-03-13 DIAGNOSIS — T380X5A Adverse effect of glucocorticoids and synthetic analogues, initial encounter: Secondary | ICD-10-CM

## 2019-03-13 DIAGNOSIS — E099 Drug or chemical induced diabetes mellitus without complications: Secondary | ICD-10-CM | POA: Diagnosis not present

## 2019-03-13 DIAGNOSIS — E559 Vitamin D deficiency, unspecified: Secondary | ICD-10-CM | POA: Diagnosis not present

## 2019-03-13 DIAGNOSIS — Z0001 Encounter for general adult medical examination with abnormal findings: Secondary | ICD-10-CM

## 2019-03-13 LAB — HEPATIC FUNCTION PANEL
ALT: 24 U/L (ref 0–35)
AST: 24 U/L (ref 0–37)
Albumin: 4.2 g/dL (ref 3.5–5.2)
Alkaline Phosphatase: 108 U/L (ref 39–117)
Bilirubin, Direct: 0.1 mg/dL (ref 0.0–0.3)
Total Bilirubin: 0.4 mg/dL (ref 0.2–1.2)
Total Protein: 6.9 g/dL (ref 6.0–8.3)

## 2019-03-13 LAB — CBC WITH DIFFERENTIAL/PLATELET
Basophils Absolute: 0.1 10*3/uL (ref 0.0–0.1)
Basophils Relative: 1.2 % (ref 0.0–3.0)
Eosinophils Absolute: 0.3 10*3/uL (ref 0.0–0.7)
Eosinophils Relative: 5.6 % — ABNORMAL HIGH (ref 0.0–5.0)
HCT: 37.6 % (ref 36.0–46.0)
Hemoglobin: 12.4 g/dL (ref 12.0–15.0)
Lymphocytes Relative: 23.5 % (ref 12.0–46.0)
Lymphs Abs: 1.4 10*3/uL (ref 0.7–4.0)
MCHC: 33 g/dL (ref 30.0–36.0)
MCV: 84.5 fl (ref 78.0–100.0)
Monocytes Absolute: 0.7 10*3/uL (ref 0.1–1.0)
Monocytes Relative: 12.3 % — ABNORMAL HIGH (ref 3.0–12.0)
Neutro Abs: 3.4 10*3/uL (ref 1.4–7.7)
Neutrophils Relative %: 57.4 % (ref 43.0–77.0)
Platelets: 202 10*3/uL (ref 150.0–400.0)
RBC: 4.46 Mil/uL (ref 3.87–5.11)
RDW: 15.4 % (ref 11.5–15.5)
WBC: 5.9 10*3/uL (ref 4.0–10.5)

## 2019-03-13 LAB — URINALYSIS, ROUTINE W REFLEX MICROSCOPIC
Bilirubin Urine: NEGATIVE
Hgb urine dipstick: NEGATIVE
Ketones, ur: NEGATIVE
Nitrite: NEGATIVE
Specific Gravity, Urine: 1.01 (ref 1.000–1.030)
Total Protein, Urine: NEGATIVE
Urine Glucose: NEGATIVE
Urobilinogen, UA: 0.2 (ref 0.0–1.0)
pH: 7 (ref 5.0–8.0)

## 2019-03-13 LAB — IBC PANEL
Iron: 81 ug/dL (ref 42–145)
Saturation Ratios: 23.9 % (ref 20.0–50.0)
Transferrin: 242 mg/dL (ref 212.0–360.0)

## 2019-03-13 LAB — LIPID PANEL
Cholesterol: 121 mg/dL (ref 0–200)
HDL: 53.2 mg/dL (ref >39.00)
LDL Cholesterol: 54 mg/dL (ref 0–99)
NonHDL: 67.71
Total CHOL/HDL Ratio: 2
Triglycerides: 69 mg/dL (ref 0.0–149.0)
VLDL: 13.8 mg/dL (ref 0.0–40.0)

## 2019-03-13 LAB — TSH: TSH: 1.87 u[IU]/mL (ref 0.35–4.50)

## 2019-03-13 LAB — BASIC METABOLIC PANEL
BUN: 27 mg/dL — ABNORMAL HIGH (ref 6–23)
CO2: 27 mEq/L (ref 19–32)
Calcium: 9.5 mg/dL (ref 8.4–10.5)
Chloride: 100 mEq/L (ref 96–112)
Creatinine, Ser: 1.38 mg/dL — ABNORMAL HIGH (ref 0.40–1.20)
GFR: 38.22 mL/min — ABNORMAL LOW (ref >60.00)
Glucose, Bld: 120 mg/dL — ABNORMAL HIGH (ref 70–99)
Potassium: 4 mEq/L (ref 3.5–5.1)
Sodium: 136 mEq/L (ref 135–145)

## 2019-03-13 LAB — VITAMIN D 25 HYDROXY (VIT D DEFICIENCY, FRACTURES): VITD: 37.43 ng/mL (ref 30.00–100.00)

## 2019-03-13 LAB — HEMOGLOBIN A1C: Hgb A1c MFr Bld: 5.6 % (ref 4.6–6.5)

## 2019-03-13 LAB — MICROALBUMIN / CREATININE URINE RATIO
Creatinine,U: 33.2 mg/dL
Microalb Creat Ratio: 2.1 mg/g (ref 0.0–30.0)
Microalb, Ur: <0.7 mg/dL (ref 0.0–1.9)

## 2019-03-13 LAB — VITAMIN B12: Vitamin B-12: 419 pg/mL (ref 211–911)

## 2019-03-13 MED ORDER — CEPHALEXIN 500 MG PO CAPS: 500.0000 mg | ORAL_CAPSULE | Freq: Three times a day (TID) | ORAL | 0 refills | Status: AC

## 2019-03-13 NOTE — Telephone Encounter (Signed)
-----   Message from Biagio Borg, MD sent at 03/13/2019  3:19 PM EDT ----- Left message on MyChart, pt to cont same tx except  The test results show that your current treatment is OK, as the tests are stable, except the urine test seems consistent with possible infection.  I will send an antibiotic.Marland Kitchen    Shirron to please inform pt, I will do rx

## 2019-03-13 NOTE — Telephone Encounter (Signed)
Patient called back.  I did inform patient of Dr. Judi Cong response.  She did understand.

## 2019-03-13 NOTE — Telephone Encounter (Signed)
Called pt, LVM.   CRM created.  

## 2019-03-16 NOTE — Telephone Encounter (Signed)
Noted  

## 2019-04-03 ENCOUNTER — Encounter: Payer: Self-pay | Admitting: Internal Medicine

## 2019-04-06 ENCOUNTER — Encounter: Payer: Self-pay | Admitting: Internal Medicine

## 2019-04-06 MED ORDER — DILTIAZEM HCL ER 240 MG PO CP24: ORAL_CAPSULE | ORAL | 3 refills | Status: DC

## 2019-04-10 ENCOUNTER — Other Ambulatory Visit: Payer: Self-pay | Admitting: *Deleted

## 2019-04-10 MED ORDER — CLONIDINE HCL 0.1 MG PO TABS: ORAL_TABLET | ORAL | 1 refills | Status: DC

## 2019-04-22 ENCOUNTER — Encounter: Payer: Self-pay | Admitting: Gynecology

## 2019-05-06 ENCOUNTER — Other Ambulatory Visit: Payer: Self-pay | Admitting: Gynecology

## 2019-05-06 ENCOUNTER — Ambulatory Visit
Admission: RE | Admit: 2019-05-06 | Discharge: 2019-05-06 | Disposition: A | Discharge status: Home / Self Care | Payer: Commercial Managed Care - PPO | Source: Ambulatory Visit | Attending: Gynecology | Admitting: Gynecology

## 2019-05-06 ENCOUNTER — Other Ambulatory Visit: Payer: Self-pay

## 2019-05-06 DIAGNOSIS — Z1231 Encounter for screening mammogram for malignant neoplasm of breast: Secondary | ICD-10-CM

## 2019-05-15 ENCOUNTER — Other Ambulatory Visit: Payer: Self-pay

## 2019-05-18 ENCOUNTER — Ambulatory Visit (INDEPENDENT_AMBULATORY_CARE_PROVIDER_SITE_OTHER): Payer: Commercial Managed Care - PPO | Admitting: Gynecology

## 2019-05-18 ENCOUNTER — Other Ambulatory Visit: Payer: Self-pay

## 2019-05-18 ENCOUNTER — Encounter: Payer: Self-pay | Admitting: Gynecology

## 2019-05-18 ENCOUNTER — Encounter: Payer: Commercial Managed Care - PPO | Admitting: Gynecology

## 2019-05-18 VITALS — BP 122/78 | Ht 66.0 in | Wt 199.0 lb

## 2019-05-18 DIAGNOSIS — N952 Postmenopausal atrophic vaginitis: Secondary | ICD-10-CM

## 2019-05-18 DIAGNOSIS — Z01419 Encounter for gynecological examination (general) (routine) without abnormal findings: Secondary | ICD-10-CM | POA: Diagnosis not present

## 2019-05-18 NOTE — Patient Instructions (Signed)
Follow-up in 1 year for annual exam, sooner if any issues. 

## 2019-05-18 NOTE — Progress Notes (Signed)
    Tara [name] 1950/08/29 [id number]        68 y.o.  G0P0 for annual gynecologic exam.  Without gynecologic complaints  Past medical history,surgical history, problem list, medications, allergies, family history and social history were all reviewed and documented as reviewed in the EPIC chart.  ROS:  Performed with pertinent positives and negatives included in the history, assessment and plan.   Additional significant findings : None   Exam: Tara [name] assistant Vitals:   05/18/19 1530  BP: 122/78  Weight: 199 lb (90.3 kg)  Height: 5\' 6"  (1.676 m)   Body mass index is 32.12 kg/m.  General appearance:  Normal affect, orientation and appearance. Skin: Grossly normal HEENT: Without gross lesions.  No cervical or supraclavicular adenopathy. Thyroid normal.  Lungs:  Clear without wheezing, rales or rhonchi Cardiac: RR, without RMG Abdominal:  Soft, nontender, without masses, guarding, rebound, organomegaly or hernia Breasts:  Examined lying and sitting without masses, retractions, discharge or axillary adenopathy. Pelvic:  Ext, BUS, Vagina: With atrophic changes  Cervix: High in the vaginal vault difficult to visualize  Uterus: Difficult to palpate but no gross masses or tenderness  Adnexa: Without masses or tenderness    Anus and perineum: Normal   Rectovaginal: Normal sphincter tone without palpated masses or tenderness.    Assessment/Plan:  68 y.o. G0P0 female for annual gynecologic exam.   1. Postmenopausal/atrophic genital changes.  No significant menopausal symptoms or any vaginal bleeding. 2. Pap smear/HPV 2018.  No Pap smear done today.  No history of abnormal Pap smears. 3. Colonoscopy 2020.  Repeat at their recommended interval. 4. Mammography 04/2019.  Continue with annual mammography when due.  Breast exam normal today. 5. DEXA 2019 normal.  Plan repeat DEXA at age 68. 42. Health maintenance.  No routine lab work done as patient does this elsewhere.  Follow-up 1  year, sooner as needed.   Anastasio Auerbach MD, 3:50 PM 05/18/2019

## 2019-05-21 ENCOUNTER — Ambulatory Visit (INDEPENDENT_AMBULATORY_CARE_PROVIDER_SITE_OTHER): Payer: Commercial Managed Care - PPO | Admitting: Podiatry

## 2019-05-21 ENCOUNTER — Other Ambulatory Visit: Payer: Self-pay

## 2019-05-21 DIAGNOSIS — E1169 Type 2 diabetes mellitus with other specified complication: Secondary | ICD-10-CM

## 2019-05-21 DIAGNOSIS — B351 Tinea unguium: Secondary | ICD-10-CM

## 2019-05-21 DIAGNOSIS — L84 Corns and callosities: Secondary | ICD-10-CM | POA: Diagnosis not present

## 2019-05-21 DIAGNOSIS — E1151 Type 2 diabetes mellitus with diabetic peripheral angiopathy without gangrene: Secondary | ICD-10-CM

## 2019-05-21 NOTE — Progress Notes (Signed)
Subjective:  Patient ID: Tara [name], female    DOB: January 20, 1951,  MRN: [id number]  Chief Complaint  Patient presents with  . Nail Problem    Nail trim 1-5 bilateral    68 y.o. female presents  for diabetic foot care. Last A1c 6.0. No new concerns.  Review of Systems: Negative except as noted in the HPI. Denies N/V/F/Ch.  Past Medical History:  Diagnosis Date  . Anemia   . ANKLE PAIN, LEFT 04/01/2008  . ANXIETY 04/17/2007  . Arthritis   . Chronic kidney disease    stage 3  . Colon polyp   . COLONIC POLYPS, HX OF 08/01/2007  . CONTUSIONS, MULTIPLE 04/01/2009  . DEPRESSION 04/17/2007  . DIZZINESS 08/01/2007  . DYSPNEA 08/01/2007   with exertion  . Enlargement of lymph nodes 08/13/2007  . Excessive involuntary blinking    per pt,going on since 2018  . GLUCOSE INTOLERANCE 08/01/2007  . 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 HYPERLIPIDEMIA 08/01/2007   no meds  . HYPERSOMNIA 07/28/2008  . HYPERTENSION 04/17/2007  . Impaired glucose tolerance 03/23/2011  . JOINT EFFUSION, LEFT KNEE 06/02/2010  . Loose body in knee 04/01/2009   pt not sure?  . Metabolic encephalopathy 96/22/2979-89/2119  . Migraines    "stopped 3-4 yr ago" (07/23/2012)  . Morbid obesity ([place]) 04/20/2007  . OTHER DISEASES OF LUNG NOT ELSEWHERE CLASSIFIED 08/01/2007  . Pain in joint, lower leg 04/01/2009  . PERIPHERAL EDEMA 04/21/2009  . Pre-diabetes   . Sarcoidosis 09/25/2007  . SHOULDER PAIN, LEFT 04/01/2009  . Sleep apnea    does not use Cpap    Current Outpatient Medications:  .  amphetamine-dextroamphetamine (ADDERALL) 30 MG tablet, Take 30 mg by mouth 2 (two) times daily., Disp: , Rfl:  .  antiseptic oral rinse (BIOTENE) LIQD, 15 mLs by Mouth Rinse route 2 (two) times daily as needed for dry mouth., Disp: , Rfl:  .  aspirin EC 81 MG tablet, Take 81 mg by mouth daily., Disp: , Rfl:  .  atenolol (TENORMIN) 50 MG tablet, TAKE 1 TABLET(50 MG) BY MOUTH DAILY, Disp: 90 tablet, Rfl: 3 .  cetirizine (ZYRTEC) 10 MG  tablet, Take 1 tablet (10 mg total) by mouth daily., Disp: 30 tablet, Rfl: 11 .  cloNIDine (CATAPRES) 0.1 MG tablet, TAKE 1 TABLET(0.1 MG) BY MOUTH TWICE DAILY, Disp: 180 tablet, Rfl: 1 .  clorazepate (TRANXENE) 7.5 MG tablet, Take 7.5 mg by mouth 3 (three) times daily., Disp: , Rfl:  .  desvenlafaxine (PRISTIQ) 100 MG 24 hr tablet, Take 100 mg by mouth at bedtime., Disp: , Rfl:  .  diltiazem (DILT-XR) 240 MG 24 hr capsule, TAKE 1 CAPSULE(240 MG) BY MOUTH DAILY, Disp: 90 capsule, Rfl: 3 .  EMGALITY 120 MG/ML SOAJ, , Disp: , Rfl:  .  ferrous sulfate 325 (65 FE) MG tablet, Take 325 mg by mouth daily with breakfast., Disp: , Rfl:  .  furosemide (LASIX) 40 MG tablet, Take 1 tablet (40 mg total) by mouth 2 (two) times daily. (Patient taking differently: Take 40 mg by mouth daily. ), Disp: 30 tablet, Rfl: 0 .  hydrALAZINE (APRESOLINE) 100 MG tablet, Take 1 tablet (100 mg total) by mouth every 8 (eight) hours. (Patient taking differently: Take 100 mg by mouth 3 (three) times daily. ), Disp: , Rfl:  .  latanoprost (XALATAN) 0.005 % ophthalmic solution, Place 1 drop into both eyes at bedtime., Disp: , Rfl:  .  Melatonin 5 MG TABS, Take 10 mg by mouth  at bedtime., Disp: , Rfl:  .  Multiple Vitamins-Minerals (MULTIVITAMIN WITH MINERALS) tablet, Take 1 tablet by mouth daily., Disp: , Rfl:  .  Olopatadine HCl 0.2 % SOLN, , Disp: , Rfl:  .  Omega 3 1200 MG CAPS, Take 2,400 mg by mouth at bedtime., Disp: , Rfl:  .  timolol (TIMOPTIC) 0.5 % ophthalmic solution, Place 1 drop into both eyes 2 (two) times daily., Disp: , Rfl: 0 .  UNABLE TO FIND, Med Name: Special blend of serum eye drops made at [facility]., Disp: , Rfl:  .  zaleplon (SONATA) 10 MG capsule, Take 10 mg by mouth at bedtime as needed for sleep., Disp: , Rfl:   Current Facility-Administered Medications:  .  0.9 %  sodium chloride infusion, 500 mL, Intravenous, Once, Nandigam, Venia Minks, MD  Social History   Tobacco Use  Smoking Status Never Smoker   Smokeless Tobacco Never Used    Allergies  Allergen Reactions  . Fluoxetine Hcl Other (See Comments)    (PROZAC) Suicidal thoughts  . Chocolate Other (See Comments)    SOMETIMES CAUSES SEVERE HEADACHES  . Floxin [Ocuflox] Anxiety    shaky  . Other Nausea And Vomiting    INSTANT ICED TEA PACKETS  . Sulfonamide Derivatives Rash   Objective:  There were no vitals filed for this visit. There is no height or weight on file to calculate BMI. Constitutional Well developed. Well nourished.  Vascular Dorsalis pedis pulses present 1+ bilaterally  Posterior tibial pulses absent bilaterally  Pedal hair growth absent. Capillary refill normal to all digits.  No cyanosis or clubbing noted.  Neurologic Normal speech. Oriented to person, place, and time. Epicritic sensation to light touch grossly present bilaterally. Protective sensation with 5.07 monofilament  present bilaterally. Vibratory sensation present bilaterally.  Dermatologic Nails elongated, thickened, dystrophic. No open wounds. No skin lesions.  Orthopedic: Normal joint ROM without pain or crepitus bilaterally. No visible deformities. No bony tenderness.   Assessment:   1. Onychomycosis of multiple toenails with type 2 diabetes mellitus and peripheral angiopathy (HCC)   2. Callus    Plan:  Patient was evaluated and treated and all questions answered.  Diabetes with PAD, Onychomycosis -Routine foot care as below  Procedure: Nail Debridement Rationale: Patient meets criteria for routine foot care due to PAD Type of Debridement: manual, sharp debridement. Instrumentation: Nail nipper, rotary burr. Number of Nails: 10  Procedure: Paring of Lesion Rationale: painful hyperkeratotic lesion Type of Debridement: manual, sharp debridement. Instrumentation: 312 blade Number of Lesions: 1    Return in about 3 months (around 08/21/2019) for Diabetic Foot Care.

## 2019-06-05 ENCOUNTER — Other Ambulatory Visit: Payer: Self-pay | Admitting: Specialist

## 2019-06-05 DIAGNOSIS — R519 Headache, unspecified: Secondary | ICD-10-CM

## 2019-06-05 DIAGNOSIS — H5710 Ocular pain, unspecified eye: Secondary | ICD-10-CM

## 2019-06-21 ENCOUNTER — Other Ambulatory Visit: Payer: Self-pay

## 2019-06-21 ENCOUNTER — Ambulatory Visit
Admission: RE | Admit: 2019-06-21 | Discharge: 2019-06-21 | Disposition: A | Discharge status: Home / Self Care | Payer: Commercial Managed Care - PPO | Source: Ambulatory Visit | Attending: Specialist | Admitting: Specialist

## 2019-06-21 DIAGNOSIS — R519 Headache, unspecified: Secondary | ICD-10-CM

## 2019-06-21 DIAGNOSIS — H5710 Ocular pain, unspecified eye: Secondary | ICD-10-CM

## 2019-06-21 MED ORDER — GADOBENATE DIMEGLUMINE 529 MG/ML IV SOLN
18.0000 mL | Freq: Once | INTRAVENOUS | Status: AC | PRN
  Administered 2019-06-21: 17:00:00 18 mL via INTRAVENOUS

## 2019-07-08 ENCOUNTER — Ambulatory Visit (INDEPENDENT_AMBULATORY_CARE_PROVIDER_SITE_OTHER): Payer: Commercial Managed Care - PPO | Admitting: Internal Medicine

## 2019-07-08 ENCOUNTER — Encounter: Payer: Self-pay | Admitting: Internal Medicine

## 2019-07-08 ENCOUNTER — Other Ambulatory Visit: Payer: Self-pay

## 2019-07-08 VITALS — BP 116/60 | HR 56 | Temp 97.9°F | Resp 14 | Ht 66.0 in | Wt 207.0 lb

## 2019-07-08 DIAGNOSIS — I10 Essential (primary) hypertension: Secondary | ICD-10-CM | POA: Diagnosis not present

## 2019-07-08 DIAGNOSIS — J309 Allergic rhinitis, unspecified: Secondary | ICD-10-CM

## 2019-07-08 DIAGNOSIS — R001 Bradycardia, unspecified: Secondary | ICD-10-CM

## 2019-07-08 DIAGNOSIS — G43809 Other migraine, not intractable, without status migrainosus: Secondary | ICD-10-CM

## 2019-07-08 MED ORDER — RIZATRIPTAN BENZOATE 10 MG PO TBDP: 10.0000 mg | ORAL_TABLET | ORAL | 5 refills | Status: DC | PRN

## 2019-07-08 NOTE — Progress Notes (Signed)
Subjective:    Patient ID: Tara [name], female    DOB: 1950/11/28, 68 y.o.   MRN: [id number]  HPI  Here to f/u; overall doing ok,  Pt denies chest pain, increasing sob or doe, wheezing, orthopnea, PND, increased LE swelling, palpitations, dizziness or syncope.  Pt denies new neurological symptoms such as new headache, or facial or extremity weakness or numbness,  But has increased frequency of migraine recently..  Pt denies polydipsia, polyuria, or low sugar episode.  Pt states overall good compliance with meds, mostly trying to follow appropriate diet, with wt overall stable, S/p cataract surgury last thur, and intraop pulse was low, also HR [facility].  Dizzy sometimes and dry eyes and dry mouth.  HR noted to be low today.  Does have several wks ongoing nasal allergy symptoms with clearish congestion, itch and sneezing, without fever, pain, ST, cough, swelling or wheezing.   Past Medical History:  Diagnosis Date  . Anemia   . ANKLE PAIN, LEFT 04/01/2008  . ANXIETY 04/17/2007  . Arthritis   . Chronic kidney disease    stage 3  . Colon polyp   . COLONIC POLYPS, HX OF 08/01/2007  . CONTUSIONS, MULTIPLE 04/01/2009  . DEPRESSION 04/17/2007  . DIZZINESS 08/01/2007  . DYSPNEA 08/01/2007   with exertion  . Enlargement of lymph nodes 08/13/2007  . Excessive involuntary blinking    per pt,going on since 2018  . GLUCOSE INTOLERANCE 08/01/2007  . 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 HYPERLIPIDEMIA 08/01/2007   no meds  . HYPERSOMNIA 07/28/2008  . HYPERTENSION 04/17/2007  . Impaired glucose tolerance 03/23/2011  . JOINT EFFUSION, LEFT KNEE 06/02/2010  . Loose body in knee 04/01/2009   pt not sure?  . Metabolic encephalopathy 91/63/8466-59/9357  . Migraines    "stopped 3-4 yr ago" (07/23/2012)  . Morbid obesity ([place]) 04/20/2007  . OTHER DISEASES OF LUNG NOT ELSEWHERE CLASSIFIED 08/01/2007  . Pain in joint, lower leg 04/01/2009  . PERIPHERAL EDEMA 04/21/2009  .  Pre-diabetes   . Sarcoidosis 09/25/2007  . SHOULDER PAIN, LEFT 04/01/2009  . Sleep apnea    does not use Cpap   Past Surgical History:  Procedure Laterality Date  . BREAST SURGERY     Biopsy benign/bil breaST  . CATARACT EXTRACTION     RIGHT EYE  . COLONOSCOPY    . COMBINED MEDIASTINOSCOPY AND BRONCHOSCOPY  08/2007  . COMBINED MEDIASTINOSCOPY AND BRONCHOSCOPY  2009  . Dental implant    . FRACTURE SURGERY  ?02/1997   "left upper arm; put rod in" (07/23/2012)  . GUM SURGERY  2000-?2009   "several ORs; soft tissue graft; took material from roof of mouth" (07/23/2012  . HUMERUS SURGERY Left 1998   rod insertion  . KNEE ARTHROSCOPY  03/2004; 06/2009   "right; left" Dr. Theda Sers  . KNEE SURGERY  2012   ARTHROSCOPIC LEFT KNEE  . LYMPH NODE BIOPSY  ~ 2009   "for sarcoidosis; don't know exactly which nodes" (07/23/2102)  . MYOMECTOMY  1994   Open  . REFRACTIVE SURGERY  08/1998   "both eyes" (07/23/2012)  . REFRACTIVE SURGERY  2000   Bil  . TOTAL KNEE ARTHROPLASTY Left 09/24/2016   Procedure: TOTAL KNEE ARTHROPLASTY;  Surgeon: Dorna Leitz, MD;  Location: [facility];  Service: Orthopedics;  Laterality: Left;    reports that she has never smoked. She has never used smokeless tobacco. She reports current alcohol use. She reports that she does not  use drugs. family history includes Asthma in her sister; Diabetes in her mother; Gout in her sister; Heart disease (age of onset: 1) in her father; Hypertension in her father, mother, and sister; Stroke in her mother. Allergies  Allergen Reactions  . Fluoxetine Hcl Other (See Comments)    (PROZAC) Suicidal thoughts  . Chocolate Other (See Comments)    SOMETIMES CAUSES SEVERE HEADACHES  . Floxin [Ocuflox] Anxiety    shaky  . Other Nausea And Vomiting    INSTANT ICED TEA PACKETS  . Sulfonamide Derivatives Rash   Current Outpatient Medications on File Prior to Visit  Medication Sig Dispense Refill  . antiseptic oral rinse (BIOTENE) LIQD 15 mLs by Mouth  Rinse route 2 (two) times daily as needed for dry mouth.    Marland Kitchen aspirin EC 81 MG tablet Take 81 mg by mouth daily.    Marland Kitchen atenolol (TENORMIN) 50 MG tablet TAKE 1 TABLET(50 MG) BY MOUTH DAILY 90 tablet 3  . clorazepate (TRANXENE) 7.5 MG tablet Take 7.5 mg by mouth 3 (three) times daily.    Marland Kitchen desvenlafaxine (PRISTIQ) 100 MG 24 hr tablet Take 100 mg by mouth at bedtime.    Marland Kitchen diltiazem (DILT-XR) 240 MG 24 hr capsule TAKE 1 CAPSULE(240 MG) BY MOUTH DAILY 90 capsule 3  . EMGALITY 120 MG/ML SOAJ     . ferrous sulfate 325 (65 FE) MG tablet Take 325 mg by mouth daily with breakfast.    . furosemide (LASIX) 40 MG tablet Take 1 tablet (40 mg total) by mouth 2 (two) times daily. (Patient taking differently: Take 40 mg by mouth daily. ) 30 tablet 0  . hydrALAZINE (APRESOLINE) 100 MG tablet Take 1 tablet (100 mg total) by mouth every 8 (eight) hours. (Patient taking differently: Take 100 mg by mouth 3 (three) times daily. )    . latanoprost (XALATAN) 0.005 % ophthalmic solution Place 1 drop into both eyes at bedtime.    . Melatonin 5 MG TABS Take 10 mg by mouth at bedtime.    . Multiple Vitamins-Minerals (MULTIVITAMIN WITH MINERALS) tablet Take 1 tablet by mouth daily.    . Olopatadine HCl 0.2 % SOLN     . Omega 3 1200 MG CAPS Take 2,400 mg by mouth at bedtime.    . timolol (TIMOPTIC) 0.5 % ophthalmic solution Place 1 drop into both eyes 2 (two) times daily.  0  . zaleplon (SONATA) 10 MG capsule Take 10 mg by mouth at bedtime as needed for sleep.    Marland Kitchen zonisamide (ZONEGRAN) 25 MG capsule Take 25 mg by mouth 2 (two) times daily. 4 tabs daily    . amphetamine-dextroamphetamine (ADDERALL) 30 MG tablet Take 30 mg by mouth 2 (two) times daily.    . cetirizine (ZYRTEC) 10 MG tablet Take 1 tablet (10 mg total) by mouth daily. (Patient not taking: Reported on 07/08/2019) 30 tablet 11  . UNABLE TO FIND Med Name: Special blend of serum eye drops made at [facility].     No current facility-administered medications on file  prior to visit.   Review of Systems  Constitutional: Negative for other unusual diaphoresis or sweats HENT: Negative for ear discharge or swelling Eyes: Negative for other worsening visual disturbances Respiratory: Negative for stridor or other swelling  Gastrointestinal: Negative for worsening distension or other blood Genitourinary: Negative for retention or other urinary change Musculoskeletal: Negative for other MSK pain or swelling Skin: Negative for color change or other new lesions Neurological: Negative for worsening tremors and other  numbness  Psychiatric/Behavioral: Negative for worsening agitation or other fatigue All otherwise neg per pt     Objective:   Physical Exam BP 116/60   Pulse (!) 56   Temp 97.9 F (36.6 C)   Resp 14   Ht 5\' 6"  (1.676 m)   Wt 207 lb (93.9 kg)   SpO2 99%   BMI 33.41 kg/m  VS noted,  Constitutional: Pt appears in NAD HENT: Head: NCAT.  Right Ear: External ear normal.  Left Ear: External ear normal.  Eyes: . Pupils are equal, round, and reactive to light. Conjunctivae and EOM are normal Nose: without d/c or deformity Neck: Neck supple. Gross normal ROM Cardiovascular: Normal rate and regular rhythm.   Pulmonary/Chest: Effort normal and breath sounds without rales or wheezing.  Abd:  Soft, NT, ND, + BS, no organomegaly Neurological: Pt is alert. At baseline orientation, motor grossly intact Skin: Skin is warm. No rashes, other new lesions, no LE edema Psychiatric: Pt behavior is normal without agitation  All otherwise neg per pt  Lab Results  Component Value Date   WBC 5.9 03/13/2019   HGB 12.4 03/13/2019   HCT 37.6 03/13/2019   PLT 202.0 03/13/2019   GLUCOSE 120 (H) 03/13/2019   CHOL 121 03/13/2019   TRIG 69.0 03/13/2019   HDL 53.20 03/13/2019   LDLDIRECT 50.0 09/24/2017   LDLCALC 54 03/13/2019   ALT 24 03/13/2019   AST 24 03/13/2019   NA 136 03/13/2019   K 4.0 03/13/2019   CL 100 03/13/2019   CREATININE 1.38 (H)  03/13/2019   BUN 27 (H) 03/13/2019   CO2 27 03/13/2019   TSH 1.87 03/13/2019   INR 0.98 09/17/2016   HGBA1C 5.6 03/13/2019   MICROALBUR <0.7 03/13/2019       Assessment & Plan:

## 2019-07-08 NOTE — Patient Instructions (Signed)
Ok to wean off the clonidine by taking one in the AM only for 1 wk, then stop completely after that  Please continue all other medications as before, and refills have been done if requested.  Please have the pharmacy call with any other refills you may need.  Please continue your efforts at being more active, low cholesterol diet, and weight control.  You are otherwise up to date with prevention measures today.  Please keep your appointments with your specialists as you may have planned - allergy  Please take all new medication as prescribed - the generic for maxalt for Headaches as needed

## 2019-07-18 ENCOUNTER — Encounter: Payer: Self-pay | Admitting: Internal Medicine

## 2019-07-18 DIAGNOSIS — R001 Bradycardia, unspecified: Secondary | ICD-10-CM | POA: Insufficient documentation

## 2019-07-18 NOTE — Assessment & Plan Note (Signed)
Ok for wean off clonidine and should help dizziness and dry mouth

## 2019-07-18 NOTE — Assessment & Plan Note (Signed)
Mild, to cont home med tx, f/u allergy per pt preference

## 2019-07-18 NOTE — Assessment & Plan Note (Signed)
Ok for change to maxalt prn,  to f/u any worsening symptoms or concerns

## 2019-07-18 NOTE — Assessment & Plan Note (Signed)
stable overall by history and exam, recent data reviewed with pt, and pt to continue medical treatment as before,  to f/u any worsening symptoms or concerns  

## 2019-07-26 ENCOUNTER — Other Ambulatory Visit: Payer: Self-pay | Admitting: Internal Medicine

## 2019-07-26 NOTE — Telephone Encounter (Signed)
Please refill as per office routine med refill policy (all routine meds refilled for 3 mo or monthly per pt preference up to one year from last visit, then month to month grace period for 3 mo, then further med refills will have to be denied)  

## 2019-08-20 ENCOUNTER — Other Ambulatory Visit: Payer: Self-pay

## 2019-08-20 ENCOUNTER — Ambulatory Visit (INDEPENDENT_AMBULATORY_CARE_PROVIDER_SITE_OTHER): Payer: Commercial Managed Care - PPO | Admitting: Podiatry

## 2019-08-20 DIAGNOSIS — E1169 Type 2 diabetes mellitus with other specified complication: Secondary | ICD-10-CM

## 2019-08-20 DIAGNOSIS — L84 Corns and callosities: Secondary | ICD-10-CM

## 2019-08-20 DIAGNOSIS — B351 Tinea unguium: Secondary | ICD-10-CM

## 2019-08-20 DIAGNOSIS — E1151 Type 2 diabetes mellitus with diabetic peripheral angiopathy without gangrene: Secondary | ICD-10-CM

## 2019-08-20 NOTE — Progress Notes (Signed)
  Subjective:  Patient ID: Jacqulyn Barresi, female    DOB: [birth year],  MRN: [id number]  Chief Complaint  Patient presents with  . Nail Problem    Nail trim 1-5 bilateral    69 y.o. female presents with the above complaint. History confirmed with patient.   Objective:  Physical Exam: warm, good capillary refill, nail exam onychomycosis of the toenails, no trophic changes or ulcerative lesions, normal DP and non-palpable PT pulses, and normal sensory exam. Left Foot: HPK submet 5    Assessment:   1. Onychomycosis of multiple toenails with type 2 diabetes mellitus and peripheral angiopathy (HCC)   2. Callus    Plan:  Patient was evaluated and treated and all questions answered.  Onychomycosis, Diabetes and PAD -Patient is diabetic with a qualifying condition for at risk foot care.  Procedure: Nail Debridement Rationale: Patient meets criteria for routine foot care due to PAD Type of Debridement: manual, sharp debridement. Instrumentation: Nail nipper, rotary burr. Number of Nails: 10  Return in about 3 months (around 11/17/2019) for Diabetic Foot Care.

## 2019-09-08 ENCOUNTER — Other Ambulatory Visit: Payer: Self-pay

## 2019-09-08 ENCOUNTER — Encounter: Payer: Self-pay | Admitting: Internal Medicine

## 2019-09-08 ENCOUNTER — Ambulatory Visit (INDEPENDENT_AMBULATORY_CARE_PROVIDER_SITE_OTHER): Payer: Commercial Managed Care - PPO | Admitting: Internal Medicine

## 2019-09-08 VITALS — BP 116/78 | HR 41 | Temp 97.6°F | Ht 66.0 in | Wt 199.0 lb

## 2019-09-08 DIAGNOSIS — D869 Sarcoidosis, unspecified: Secondary | ICD-10-CM

## 2019-09-08 DIAGNOSIS — G4733 Obstructive sleep apnea (adult) (pediatric): Secondary | ICD-10-CM

## 2019-09-08 NOTE — Assessment & Plan Note (Signed)
Updating ACE level. Not obviously clinically active.

## 2019-09-08 NOTE — Assessment & Plan Note (Signed)
Oral appliance per Dr Toy Cookey

## 2019-09-08 NOTE — Patient Instructions (Signed)
Order- lab ACE level   Dx sarcoid  Please call as needed

## 2019-09-08 NOTE — Progress Notes (Signed)
HPI F never smoker followed for sarcoid, OSA/ failed CPAP, complicated by hypercalcemia, renal insufficency, glaucoma PFT 02/07/15-minimal obstructive airways disease, minimal diffusion defect, insignificant response to bronchodilator ACE in July, 2017 remained elevated 74 (8-52) NPSG  12/ 11/09 - AhI 1.4/hour I desaturation to 89%, body weight 245 lbs Unattended Home Sleep Test 03/12/17-AHI 27.6/hour, desaturation to 75%, body weight 220 pounds --------------------------------------------------------------------------  09/04/2018- 69 year old female never smoker followed for Sarcoid, OSA/ oral appliance Dr Toy Cookey complicated by hypercalcemia, renal insufficiency, HBP, depression, glaucoma -----OSA: Pt uses oral appliance for sleep apnea; had sleep study last week through Dr. Toy Cookey and due for another one on March 9th.  Weight 232 pounds today She is working with Dr. Toy Cookey to adjust oral appliance.  Has not settled into a regular pattern yet. Dr [facility] / [facility] provides Adderall. Still much trouble with dry and painful eyes with frequent blinking.  Has seen a number of different ophthalmologists. Only occasional dry cough without wheeze, on Timoptic Dr. [name] recently for shortness of breath.  CT was negative for PE, d-dimer elevated 1.36, BNP elevated 229.  We reviewed the CT report which described cardiac enlargement. Admits easier choking with food and drink recently.  I discussed LPR management and chin tuck.  09/08/19-  69 year old female never smoker followed for Sarcoid, OSA/ oral appliance Dr Toy Cookey complicated by hypercalcemia, renal insufficiency, HBP, depression, glaucoma PFT 03/11/2019- ------F/u DOE/ OSA.Breathing is at patient's baseline.  Body weight today 199 lbs Had flu vax and 1 covid vax Roof leak led to ceiling collapse in December. Asbestos found in insulation. Had to be in temp housing x 3 weeks during remediation. Not needing rescue inhaler- no cough or wheeze. Complains of  burning cheeks- unclear any connection to these home problems. No visible rash.  No adenopathy  CTa chest 06/21/2019- Lungs/Pleura: No focal airspace consolidation, pulmonary edema, pleural effusion or pneumothorax. No suspicious nodule or mass. Minimal dependent atelectasis. MPRESSION: 1. Negative for acute pulmonary embolus, pneumonia or other acute cardiopulmonary process. 2. Cardiomegaly.   ROS- see HPI  + = positive Constitutional:     weight loss, no-night sweats, fevers, chills, + fatigue, lassitude. HEENT:   No-  headaches, +difficulty swallowing, tooth/dental problems, sore throat,       No-  sneezing, itching, ear ache, nasal congestion, post nasal drip,  CV:  No- chest pain, no-orthopnea, PND, swelling in lower extremities, anasarca,dizziness, +palpitations  Resp: + shortness of breath with exertion or at rest.              No-   productive cough,  + non-productive cough,  No- coughing up of blood.              No- wheezing.   Skin: No-   rash or lesions.  +cheeks burn GI:  No-   heartburn, indigestion, abdominal pain, nausea, vomiting,  GU:  MS:  +  joint pain or swelling.   Neuro-     nothing unusual Psych:  No- change in mood or affect. No depression or anxiety.  No memory loss.  OBJ- Physical Exam   General- Alert, Oriented, Affect-appropriate, Distress- none acute, + overweight Skin- rash-none, lesions- none, excoriation- none,  Lymphadenopathy- none Head- atraumatic            Eyes- +continues frequent blinking             Ears- Hearing, canals-normal            Nose- Clear, no-Septal dev, mucus, polyps, erosion, perforation  Throat- Mallampati III, mucosa clear , drainage- none, tonsils- atrophic Neck- flexible , trachea midline, no stridor , thyroid nl, carotid no bruit Chest - symmetrical excursion , unlabored           Heart/CV- RRR + extra beats , no murmur , no gallop  , no rub, nl s1 s2                           - JVD- none , + elastic hose,  stasis changes- none, varices- none           Lung- clear to P&A, wheeze- none, cough- none , dullness-none, rub- none           Chest wall-  Abd-  Br/ Gen/ Rectal- Not done, not indicated Extrem- cyanosis- none, clubbing, none, atrophy- none, strength- nl Neuro- +blinking, grimacing, ? Tics or frontal lobe ?

## 2019-09-09 LAB — ANGIOTENSIN CONVERTING ENZYME: Angiotensin-Converting Enzyme: 44 U/L (ref 9–67)

## 2019-09-15 ENCOUNTER — Ambulatory Visit (INDEPENDENT_AMBULATORY_CARE_PROVIDER_SITE_OTHER): Payer: Commercial Managed Care - PPO | Admitting: Internal Medicine

## 2019-09-15 ENCOUNTER — Encounter: Payer: Self-pay | Admitting: Internal Medicine

## 2019-09-15 ENCOUNTER — Other Ambulatory Visit: Payer: Self-pay

## 2019-09-15 VITALS — BP 118/68 | HR 39 | Temp 98.5°F | Ht 66.0 in | Wt 198.2 lb

## 2019-09-15 DIAGNOSIS — N184 Chronic kidney disease, stage 4 (severe): Secondary | ICD-10-CM | POA: Diagnosis not present

## 2019-09-15 DIAGNOSIS — T380X5D Adverse effect of glucocorticoids and synthetic analogues, subsequent encounter: Secondary | ICD-10-CM | POA: Diagnosis not present

## 2019-09-15 DIAGNOSIS — E099 Drug or chemical induced diabetes mellitus without complications: Secondary | ICD-10-CM

## 2019-09-15 DIAGNOSIS — G8929 Other chronic pain: Secondary | ICD-10-CM | POA: Insufficient documentation

## 2019-09-15 DIAGNOSIS — Z0000 Encounter for general adult medical examination without abnormal findings: Secondary | ICD-10-CM

## 2019-09-15 LAB — URINALYSIS, ROUTINE W REFLEX MICROSCOPIC
Bilirubin Urine: NEGATIVE
Hgb urine dipstick: NEGATIVE
Ketones, ur: NEGATIVE
Nitrite: NEGATIVE
RBC / HPF: NONE SEEN (ref 0–?)
Specific Gravity, Urine: 1.01 (ref 1.000–1.030)
Total Protein, Urine: NEGATIVE
Urine Glucose: NEGATIVE
Urobilinogen, UA: 0.2 (ref 0.0–1.0)
pH: 6.5 (ref 5.0–8.0)

## 2019-09-15 LAB — HEPATIC FUNCTION PANEL
ALT: 22 U/L (ref 0–35)
AST: 24 U/L (ref 0–37)
Albumin: 3.9 g/dL (ref 3.5–5.2)
Alkaline Phosphatase: 99 U/L (ref 39–117)
Bilirubin, Direct: 0.1 mg/dL (ref 0.0–0.3)
Total Bilirubin: 0.4 mg/dL (ref 0.2–1.2)
Total Protein: 6.8 g/dL (ref 6.0–8.3)

## 2019-09-15 LAB — CBC WITH DIFFERENTIAL/PLATELET
Basophils Absolute: 0.1 10*3/uL (ref 0.0–0.1)
Basophils Relative: 1.1 % (ref 0.0–3.0)
Eosinophils Absolute: 0.2 10*3/uL (ref 0.0–0.7)
Eosinophils Relative: 3.7 % (ref 0.0–5.0)
HCT: 38.8 % (ref 36.0–46.0)
Hemoglobin: 12.8 g/dL (ref 12.0–15.0)
Lymphocytes Relative: 22.5 % (ref 12.0–46.0)
Lymphs Abs: 1.5 10*3/uL (ref 0.7–4.0)
MCHC: 33.1 g/dL (ref 30.0–36.0)
MCV: 85.2 fl (ref 78.0–100.0)
Monocytes Absolute: 0.7 10*3/uL (ref 0.1–1.0)
Monocytes Relative: 10.3 % (ref 3.0–12.0)
Neutro Abs: 4.1 10*3/uL (ref 1.4–7.7)
Neutrophils Relative %: 62.4 % (ref 43.0–77.0)
Platelets: 219 10*3/uL (ref 150.0–400.0)
RBC: 4.56 Mil/uL (ref 3.87–5.11)
RDW: 15 % (ref 11.5–15.5)
WBC: 6.5 10*3/uL (ref 4.0–10.5)

## 2019-09-15 LAB — BASIC METABOLIC PANEL
BUN: 33 mg/dL — ABNORMAL HIGH (ref 6–23)
CO2: 29 mEq/L (ref 19–32)
Calcium: 9.5 mg/dL (ref 8.4–10.5)
Chloride: 101 mEq/L (ref 96–112)
Creatinine, Ser: 1.4 mg/dL — ABNORMAL HIGH (ref 0.40–1.20)
GFR: 37.53 mL/min — ABNORMAL LOW (ref >60.00)
Glucose, Bld: 109 mg/dL — ABNORMAL HIGH (ref 70–99)
Potassium: 3.9 mEq/L (ref 3.5–5.1)
Sodium: 138 mEq/L (ref 135–145)

## 2019-09-15 LAB — LIPID PANEL
Cholesterol: 140 mg/dL (ref 0–200)
HDL: 52.1 mg/dL (ref >39.00)
LDL Cholesterol: 73 mg/dL (ref 0–99)
NonHDL: 87.99
Total CHOL/HDL Ratio: 3
Triglycerides: 74 mg/dL (ref 0.0–149.0)
VLDL: 14.8 mg/dL (ref 0.0–40.0)

## 2019-09-15 LAB — MICROALBUMIN / CREATININE URINE RATIO
Creatinine,U: 31.5 mg/dL
Microalb Creat Ratio: 2.2 mg/g (ref 0.0–30.0)
Microalb, Ur: <0.7 mg/dL (ref 0.0–1.9)

## 2019-09-15 LAB — TSH: TSH: 1.64 u[IU]/mL (ref 0.35–4.50)

## 2019-09-15 LAB — HEMOGLOBIN A1C: Hgb A1c MFr Bld: 5.5 % (ref 4.6–6.5)

## 2019-09-15 NOTE — Patient Instructions (Signed)

## 2019-09-15 NOTE — Progress Notes (Signed)
Subjective:    Patient ID: Tara [name], female    DOB: 01-Mar-1951, 69 y.o.   MRN: [id number]  HPI  Here for wellness and f/u;  Overall doing ok;  Pt denies Chest pain, worsening SOB, DOE, wheezing, orthopnea, PND, worsening LE edema, palpitations, dizziness or syncope.  Pt denies neurological change such as new headache, facial or extremity weakness.  Pt denies polydipsia, polyuria, or low sugar symptoms. Pt states overall good compliance with treatment and medications, good tolerability, and has been trying to follow appropriate diet.  Pt denies worsening depressive symptoms, suicidal ideation or panic. No fever, night sweats, wt loss, loss of appetite, or other constitutional symptoms.  Pt states good ability with ADL's, has low fall risk, home safety reviewed and adequate, no other significant changes in hearing or vision, and only occasionally active with exercise. Lost wt with better exercise.   Wt Readings from Last 3 Encounters:  09/15/19 198 lb 4 oz (89.9 kg)  09/08/19 199 lb (90.3 kg)  07/08/19 207 lb (93.9 kg)  Conts to see HA welness for recurring HA, and has seen neurology at [facility] for blinking issue.  Past Medical History:  Diagnosis Date  . Anemia   . ANKLE PAIN, LEFT 04/01/2008  . ANXIETY 04/17/2007  . Arthritis   . Chronic kidney disease    stage 3  . Colon polyp   . COLONIC POLYPS, HX OF 08/01/2007  . CONTUSIONS, MULTIPLE 04/01/2009  . DEPRESSION 04/17/2007  . DIZZINESS 08/01/2007  . DYSPNEA 08/01/2007   with exertion  . Enlargement of lymph nodes 08/13/2007  . Excessive involuntary blinking    per pt,going on since 2018  . GLUCOSE INTOLERANCE 08/01/2007  . 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 HYPERLIPIDEMIA 08/01/2007   no meds  . HYPERSOMNIA 07/28/2008  . HYPERTENSION 04/17/2007  . Impaired glucose tolerance 03/23/2011  . JOINT EFFUSION, LEFT KNEE 06/02/2010  . Loose body in knee 04/01/2009   pt not sure?  . Metabolic encephalopathy 01/74/9449-67/5916  . Migraines      "stopped 3-4 yr ago" (07/23/2012)  . Morbid obesity ([place]) 04/20/2007  . OTHER DISEASES OF LUNG NOT ELSEWHERE CLASSIFIED 08/01/2007  . Pain in joint, lower leg 04/01/2009  . PERIPHERAL EDEMA 04/21/2009  . Pre-diabetes   . Sarcoidosis 09/25/2007  . SHOULDER PAIN, LEFT 04/01/2009  . Sleep apnea    does not use Cpap   Past Surgical History:  Procedure Laterality Date  . BREAST SURGERY     Biopsy benign/bil breaST  . CATARACT EXTRACTION     RIGHT EYE  . COLONOSCOPY    . COMBINED MEDIASTINOSCOPY AND BRONCHOSCOPY  08/2007  . COMBINED MEDIASTINOSCOPY AND BRONCHOSCOPY  2009  . Dental implant    . FRACTURE SURGERY  ?02/1997   "left upper arm; put rod in" (07/23/2012)  . GUM SURGERY  2000-?2009   "several ORs; soft tissue graft; took material from roof of mouth" (07/23/2012  . HUMERUS SURGERY Left 1998   rod insertion  . KNEE ARTHROSCOPY  03/2004; 06/2009   "right; left" Dr. Theda Sers  . KNEE SURGERY  2012   ARTHROSCOPIC LEFT KNEE  . LYMPH NODE BIOPSY  ~ 2009   "for sarcoidosis; don't know exactly which nodes" (07/23/2102)  . MYOMECTOMY  1994   Open  . REFRACTIVE SURGERY  08/1998   "both eyes" (07/23/2012)  . REFRACTIVE SURGERY  2000   Bil  . TOTAL KNEE ARTHROPLASTY Left 09/24/2016   Procedure: TOTAL KNEE ARTHROPLASTY;  Surgeon: Dorna Leitz, MD;  Location: [place] [facility];  Service: Orthopedics;  Laterality: Left;    reports that she has never smoked. She has never used smokeless tobacco. She reports current alcohol use. She reports that she does not use drugs. family history includes Asthma in her sister; Diabetes in her mother; Gout in her sister; Heart disease (age of onset: 40) in her father; Hypertension in her father, mother, and sister; Stroke in her mother. Allergies  Allergen Reactions  . Fluoxetine Hcl Other (See Comments)    (PROZAC) Suicidal thoughts  . Chocolate Other (See Comments)    SOMETIMES CAUSES SEVERE HEADACHES  . Floxin [Ocuflox] Anxiety    shaky  . Other Nausea And Vomiting     INSTANT ICED TEA PACKETS  . Sulfonamide Derivatives Rash   Current Outpatient Medications on File Prior to Visit  Medication Sig Dispense Refill  . amoxicillin (AMOXIL) 500 MG tablet TAKE FOUR TS PO 1 HOUR B DAPP    . amphetamine-dextroamphetamine (ADDERALL) 30 MG tablet Take 30 mg by mouth 2 (two) times daily.    Marland Kitchen antiseptic oral rinse (BIOTENE) LIQD 15 mLs by Mouth Rinse route 2 (two) times daily as needed for dry mouth.    Marland Kitchen aspirin EC 81 MG tablet Take 81 mg by mouth daily.    Marland Kitchen atenolol (TENORMIN) 50 MG tablet TAKE 1 TABLET(50 MG) BY MOUTH DAILY 90 tablet 3  . baclofen (LIORESAL) 10 MG tablet Take 10 mg by mouth 2 (two) times daily.    . clorazepate (TRANXENE) 7.5 MG tablet Take 7.5 mg by mouth 3 (three) times daily.    Marland Kitchen desvenlafaxine (PRISTIQ) 100 MG 24 hr tablet Take 100 mg by mouth at bedtime.    Marland Kitchen diltiazem (DILT-XR) 240 MG 24 hr capsule TAKE 1 CAPSULE(240 MG) BY MOUTH DAILY 90 capsule 3  . EMGALITY 120 MG/ML SOAJ     . ferrous sulfate 325 (65 FE) MG tablet Take 325 mg by mouth daily with breakfast.    . furosemide (LASIX) 40 MG tablet Take 1 tablet (40 mg total) by mouth 2 (two) times daily. (Patient taking differently: Take 40 mg by mouth daily. ) 30 tablet 0  . hydrALAZINE (APRESOLINE) 100 MG tablet Take 1 tablet (100 mg total) by mouth every 8 (eight) hours. (Patient taking differently: Take 100 mg by mouth 3 (three) times daily. )    . latanoprost (XALATAN) 0.005 % ophthalmic solution Place 1 drop into both eyes at bedtime.    . Melatonin 5 MG TABS Take 10 mg by mouth at bedtime.    . Multiple Vitamins-Minerals (MULTIVITAMIN WITH MINERALS) tablet Take 1 tablet by mouth daily.    . Olopatadine HCl 0.2 % SOLN     . Omega 3 1200 MG CAPS Take 2,400 mg by mouth at bedtime.    . timolol (TIMOPTIC) 0.5 % ophthalmic solution Place 1 drop into both eyes 2 (two) times daily.  0  . UNABLE TO FIND Med Name: Special blend of serum eye drops made at [facility].    . zaleplon (SONATA) 10 MG  capsule Take 10 mg by mouth at bedtime as needed for sleep.    Marland Kitchen zonisamide (ZONEGRAN) 25 MG capsule Take 25 mg by mouth 2 (two) times daily. 4 tabs daily     No current facility-administered medications on file prior to visit.    Review of Systems All otherwise neg per pt     Objective:   Physical Exam BP 118/68 (BP Location: Right Arm, Patient Position: Sitting, Cuff Size:  Normal)   Pulse (!) 39   Temp 98.5 F (36.9 C) (Oral)   Ht 5\' 6"  (1.676 m)   Wt 198 lb 4 oz (89.9 kg)   SpO2 98%   BMI 32.00 kg/m  VS noted,  Constitutional: Pt appears in NAD HENT: Head: NCAT.  Right Ear: External ear normal.  Left Ear: External ear normal.  Eyes: . Pupils are equal, round, and reactive to light. Conjunctivae and EOM are normal Nose: without d/c or deformity Neck: Neck supple. Gross normal ROM Cardiovascular: Normal rate and regular rhythm.   Pulmonary/Chest: Effort normal and breath sounds without rales or wheezing.  Abd:  Soft, NT, ND, + BS, no organomegaly Neurological: Pt is alert. At baseline orientation, motor grossly intact Skin: Skin is warm. No rashes, other new lesions, no LE edema Psychiatric: Pt behavior is normal without agitation  All otherwise neg per pt Lab Results  Component Value Date   WBC 6.5 09/15/2019   HGB 12.8 09/15/2019   HCT 38.8 09/15/2019   PLT 219.0 09/15/2019   GLUCOSE 109 (H) 09/15/2019   CHOL 140 09/15/2019   TRIG 74.0 09/15/2019   HDL 52.10 09/15/2019   LDLDIRECT 50.0 09/24/2017   LDLCALC 73 09/15/2019   ALT 22 09/15/2019   AST 24 09/15/2019   NA 138 09/15/2019   K 3.9 09/15/2019   CL 101 09/15/2019   CREATININE 1.40 (H) 09/15/2019   BUN 33 (H) 09/15/2019   CO2 29 09/15/2019   TSH 1.64 09/15/2019   INR 0.98 09/17/2016   HGBA1C 5.5 09/15/2019   MICROALBUR <0.7 09/15/2019      Assessment & Plan:

## 2019-09-19 ENCOUNTER — Encounter: Payer: Self-pay | Admitting: Internal Medicine

## 2019-09-19 NOTE — Assessment & Plan Note (Signed)
stable overall by history and exam, recent data reviewed with pt, and pt to continue medical treatment as before,  to f/u any worsening symptoms or concerns  

## 2019-09-19 NOTE — Assessment & Plan Note (Signed)

## 2019-09-24 ENCOUNTER — Other Ambulatory Visit: Payer: Self-pay | Admitting: Internal Medicine

## 2019-11-19 ENCOUNTER — Other Ambulatory Visit: Payer: Self-pay

## 2019-11-19 ENCOUNTER — Ambulatory Visit (INDEPENDENT_AMBULATORY_CARE_PROVIDER_SITE_OTHER): Payer: Commercial Managed Care - PPO | Admitting: Podiatry

## 2019-11-19 VITALS — Temp 97.3°F

## 2019-11-19 DIAGNOSIS — E1151 Type 2 diabetes mellitus with diabetic peripheral angiopathy without gangrene: Secondary | ICD-10-CM

## 2019-11-19 DIAGNOSIS — B351 Tinea unguium: Secondary | ICD-10-CM

## 2019-11-19 DIAGNOSIS — E1169 Type 2 diabetes mellitus with other specified complication: Secondary | ICD-10-CM

## 2020-01-17 NOTE — Progress Notes (Signed)
  Subjective:  Patient ID: Silveria Botz, female    DOB: March 19, 1951,  MRN: [id number]  Chief Complaint  Patient presents with  . Nail Problem    Thick, long toenails  . Callouses    L plantar forefoot.  . Diabetes    HgbA1c in 09/2019 = 5.5.    69 y.o. female presents with the above complaint. History confirmed with patient.   Objective:  Physical Exam: warm, good capillary refill, nail exam onychomycosis of the toenails, no trophic changes or ulcerative lesions, normal DP and non-palpable PT pulses, and normal sensory exam. Left Foot: HPK submet 5    Assessment:   1. Onychomycosis of multiple toenails with type 2 diabetes mellitus and peripheral angiopathy ([place])    Plan:  Patient was evaluated and treated and all questions answered.  Onychomycosis, Diabetes and PAD -Patient is diabetic with a qualifying condition for at risk foot care.   Procedure: Nail Debridement Rationale: Patient meets criteria for routine foot care due to PAD Type of Debridement: manual, sharp debridement. Instrumentation: Nail nipper, rotary burr. Number of Nails: 10    No follow-ups on file.

## 2020-02-26 ENCOUNTER — Other Ambulatory Visit: Payer: Self-pay

## 2020-02-26 ENCOUNTER — Ambulatory Visit (INDEPENDENT_AMBULATORY_CARE_PROVIDER_SITE_OTHER): Payer: Commercial Managed Care - PPO | Admitting: Podiatry

## 2020-02-26 DIAGNOSIS — E1169 Type 2 diabetes mellitus with other specified complication: Secondary | ICD-10-CM

## 2020-02-26 DIAGNOSIS — B351 Tinea unguium: Secondary | ICD-10-CM | POA: Diagnosis not present

## 2020-02-26 DIAGNOSIS — E1151 Type 2 diabetes mellitus with diabetic peripheral angiopathy without gangrene: Secondary | ICD-10-CM | POA: Diagnosis not present

## 2020-02-26 NOTE — Progress Notes (Signed)
  Subjective:  Patient ID: Tara [name], female    DOB: [birth year],  MRN: [id number]  Chief Complaint  Patient presents with  . Nail Problem    Onychomycosis.  . Diabetes    Pt stated, "I think my last A1c was 6 or 6.5".   69 y.o. female presents with the above complaint. History confirmed with patient.   Objective:  Physical Exam: warm, good capillary refill, nail exam onychomycosis of the toenails, no trophic changes or ulcerative lesions, normal DP and non-palpable PT pulses, and normal sensory exam. Left Foot: HPK submet 5    Assessment:   1. Onychomycosis of multiple toenails with type 2 diabetes mellitus and peripheral angiopathy ([place])    Plan:  Patient was evaluated and treated and all questions answered.  Onychomycosis, Diabetes and PAD -Patient is diabetic with a qualifying condition for at risk foot care.   Procedure: Nail Debridement Rationale: Patient meets criteria for routine foot care due to PAD Type of Debridement: manual, sharp debridement. Instrumentation: Nail nipper, rotary burr. Number of Nails: 10      No follow-ups on file.

## 2020-03-18 ENCOUNTER — Encounter: Payer: Self-pay | Admitting: Internal Medicine

## 2020-03-18 ENCOUNTER — Ambulatory Visit (INDEPENDENT_AMBULATORY_CARE_PROVIDER_SITE_OTHER): Payer: Commercial Managed Care - PPO | Admitting: Internal Medicine

## 2020-03-18 ENCOUNTER — Other Ambulatory Visit: Payer: Self-pay

## 2020-03-18 VITALS — BP 130/70 | HR 42 | Temp 97.8°F | Ht 66.0 in | Wt 188.0 lb

## 2020-03-18 DIAGNOSIS — T380X5D Adverse effect of glucocorticoids and synthetic analogues, subsequent encounter: Secondary | ICD-10-CM

## 2020-03-18 DIAGNOSIS — I10 Essential (primary) hypertension: Secondary | ICD-10-CM | POA: Diagnosis not present

## 2020-03-18 DIAGNOSIS — E099 Drug or chemical induced diabetes mellitus without complications: Secondary | ICD-10-CM | POA: Diagnosis not present

## 2020-03-18 DIAGNOSIS — R001 Bradycardia, unspecified: Secondary | ICD-10-CM

## 2020-03-18 DIAGNOSIS — Z23 Encounter for immunization: Secondary | ICD-10-CM | POA: Diagnosis not present

## 2020-03-18 DIAGNOSIS — N184 Chronic kidney disease, stage 4 (severe): Secondary | ICD-10-CM

## 2020-03-18 LAB — POCT GLYCOSYLATED HEMOGLOBIN (HGB A1C): Hemoglobin A1C: 5.4 % (ref 4.0–5.6)

## 2020-03-18 NOTE — Progress Notes (Signed)
Subjective:    Patient ID: Tara [name], female    DOB: 07-29-1950, 69 y.o.   MRN: [id number]  HPI  Here to f/u; overall doing ok,  Pt denies chest pain, increasing sob or doe, wheezing, orthopnea, PND, increased LE swelling, palpitations, dizziness or syncope.  Pt denies new neurological symptoms such as new headache, or facial or extremity weakness or numbness.  Pt denies polydipsia, polyuria, or low sugar episode.  Pt states overall good compliance with meds, mostly trying to follow appropriate diet, with wt overall stable,  but little exercise however. Past Medical History:  Diagnosis Date  . Anemia   . ANKLE PAIN, LEFT 04/01/2008  . ANXIETY 04/17/2007  . Arthritis   . Chronic kidney disease    stage 3  . Colon polyp   . COLONIC POLYPS, HX OF 08/01/2007  . CONTUSIONS, MULTIPLE 04/01/2009  . DEPRESSION 04/17/2007  . DIZZINESS 08/01/2007  . DYSPNEA 08/01/2007   with exertion  . Enlargement of lymph nodes 08/13/2007  . Excessive involuntary blinking    per pt,going on since 2018  . GLUCOSE INTOLERANCE 08/01/2007  . 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 HYPERLIPIDEMIA 08/01/2007   no meds  . HYPERSOMNIA 07/28/2008  . HYPERTENSION 04/17/2007  . Impaired glucose tolerance 03/23/2011  . JOINT EFFUSION, LEFT KNEE 06/02/2010  . Loose body in knee 04/01/2009   pt not sure?  . Metabolic encephalopathy 27/12/2374-28/3151  . Migraines    "stopped 3-4 yr ago" (07/23/2012)  . Morbid obesity ([place]) 04/20/2007  . OTHER DISEASES OF LUNG NOT ELSEWHERE CLASSIFIED 08/01/2007  . Pain in joint, lower leg 04/01/2009  . PERIPHERAL EDEMA 04/21/2009  . Pre-diabetes   . Sarcoidosis 09/25/2007  . SHOULDER PAIN, LEFT 04/01/2009  . Sleep apnea    does not use Cpap   Past Surgical History:  Procedure Laterality Date  . BREAST SURGERY     Biopsy benign/bil breaST  . CATARACT EXTRACTION     RIGHT EYE  . COLONOSCOPY    . COMBINED MEDIASTINOSCOPY AND BRONCHOSCOPY  08/2007  . COMBINED MEDIASTINOSCOPY AND  BRONCHOSCOPY  2009  . Dental implant    . FRACTURE SURGERY  ?02/1997   "left upper arm; put rod in" (07/23/2012)  . GUM SURGERY  2000-?2009   "several ORs; soft tissue graft; took material from roof of mouth" (07/23/2012  . HUMERUS SURGERY Left 1998   rod insertion  . KNEE ARTHROSCOPY  03/2004; 06/2009   "right; left" Dr. Theda Sers  . KNEE SURGERY  2012   ARTHROSCOPIC LEFT KNEE  . LYMPH NODE BIOPSY  ~ 2009   "for sarcoidosis; don't know exactly which nodes" (07/23/2102)  . MYOMECTOMY  1994   Open  . REFRACTIVE SURGERY  08/1998   "both eyes" (07/23/2012)  . REFRACTIVE SURGERY  2000   Bil  . TOTAL KNEE ARTHROPLASTY Left 09/24/2016   Procedure: TOTAL KNEE ARTHROPLASTY;  Surgeon: Dorna Leitz, MD;  Location: [facility];  Service: Orthopedics;  Laterality: Left;    reports that she has never smoked. She has never used smokeless tobacco. She reports current alcohol use. She reports that she does not use drugs. family history includes Asthma in her sister; Diabetes in her mother; Gout in her sister; Heart disease (age of onset: 27) in her father; Hypertension in her father, mother, and sister; Stroke in her mother. Allergies  Allergen Reactions  . Fluoxetine Hcl Other (See Comments)    (PROZAC) Suicidal thoughts  . Chocolate Other (See Comments)    SOMETIMES CAUSES  SEVERE HEADACHES  . Floxin [Ocuflox] Anxiety    shaky  . Other Nausea And Vomiting    INSTANT ICED TEA PACKETS  . Sulfonamide Derivatives Rash   Current Outpatient Medications on File Prior to Visit  Medication Sig Dispense Refill  . amoxicillin (AMOXIL) 500 MG tablet TAKE FOUR TS PO 1 HOUR B DAPP    . amphetamine-dextroamphetamine (ADDERALL) 30 MG tablet Take 30 mg by mouth 2 (two) times daily.    Marland Kitchen antiseptic oral rinse (BIOTENE) LIQD 15 mLs by Mouth Rinse route 2 (two) times daily as needed for dry mouth.    Marland Kitchen aspirin EC 81 MG tablet Take 81 mg by mouth daily.    Marland Kitchen atenolol (TENORMIN) 50 MG tablet TAKE 1 TABLET(50 MG) BY MOUTH DAILY 90  tablet 3  . baclofen (LIORESAL) 10 MG tablet Take 10 mg by mouth 2 (two) times daily.    . brimonidine-timolol (COMBIGAN) 0.2-0.5 % ophthalmic solution     . clorazepate (TRANXENE) 7.5 MG tablet Take 7.5 mg by mouth 3 (three) times daily.    Marland Kitchen desvenlafaxine (PRISTIQ) 100 MG 24 hr tablet Take 100 mg by mouth at bedtime.    Marland Kitchen diltiazem (DILT-XR) 240 MG 24 hr capsule TAKE 1 CAPSULE(240 MG) BY MOUTH DAILY 90 capsule 3  . ferrous sulfate 325 (65 FE) MG tablet Take 325 mg by mouth daily with breakfast.    . furosemide (LASIX) 40 MG tablet Take 1 tablet (40 mg total) by mouth 2 (two) times daily. (Patient taking differently: Take 40 mg by mouth daily. ) 30 tablet 0  . hydrALAZINE (APRESOLINE) 100 MG tablet Take 1 tablet (100 mg total) by mouth every 8 (eight) hours. (Patient taking differently: Take 100 mg by mouth 3 (three) times daily. )    . latanoprost (XALATAN) 0.005 % ophthalmic solution Place 1 drop into both eyes at bedtime.    . Melatonin 5 MG TABS Take 10 mg by mouth at bedtime.    . Multiple Vitamins-Minerals (MULTIVITAMIN WITH MINERALS) tablet Take 1 tablet by mouth daily.    . Omega 3 1200 MG CAPS Take 2,400 mg by mouth at bedtime.    Marland Kitchen PREVIDENT 5000 DRY MOUTH 1.1 % GEL dental gel See admin instructions.    . zaleplon (SONATA) 10 MG capsule Take 10 mg by mouth at bedtime as needed for sleep.    Marland Kitchen zonisamide (ZONEGRAN) 100 MG capsule Take 200 mg by mouth daily.     No current facility-administered medications on file prior to visit.   Review of Systems All otherwise neg per pt    Objective:   Physical Exam BP 130/70 (BP Location: Left Arm, Patient Position: Sitting, Cuff Size: Large)   Pulse (!) 42   Temp 97.8 F (36.6 C) (Oral)   Ht 5\' 6"  (1.676 m)   Wt 188 lb (85.3 kg)   SpO2 97%   BMI 30.34 kg/m  VS noted,  Constitutional: Pt appears in NAD HENT: Head: NCAT.  Right Ear: External ear normal.  Left Ear: External ear normal.  Eyes: . Pupils are equal, round, and reactive  to light. Conjunctivae and EOM are normal Nose: without d/c or deformity Neck: Neck supple. Gross normal ROM Cardiovascular: Normal rate and regular rhythm.   Pulmonary/Chest: Effort normal and breath sounds without rales or wheezing.  Abd:  Soft, NT, ND, + BS, no organomegaly Neurological: Pt is alert. At baseline orientation, motor grossly intact Skin: Skin is warm. No rashes, other new lesions, no LE edema  Psychiatric: Pt behavior is normal without agitation  All otherwise neg per pt  POCT glycosylated hemoglobin (Hb A1C) Order: [id number] Status:  Final result Visible to patient:  No (scheduled for 03/18/2020 11:15 AM) Dx:  Steroid-induced diabetes mellitus, su...  0 Result Notes   1 HM Topic  Ref Range & Units 10:14  (03/18/20) 6 mo ago  (09/15/19) 1 yr ago  (03/13/19) 1 yr ago  (08/18/18) 1 yr ago  (03/27/18)  Hemoglobin A1C 4.0 - 5.6 % 5.4  5.5 R, CM  5.6 R, CM  6.0 R, CM  6.2 R           Assessment & Plan:

## 2020-03-18 NOTE — Patient Instructions (Signed)
Your A1c was OK today  OK to take HALF of the atenolol to 25 mg per day  If the Heart Rate is still less than 50 for the most part in 1 wk, please stop this completely  You had the Tdap (tetanus) and Prevnar 13 pneumonia shots today  Please continue all other medications as before, and refills have been done if requested.  Please have the pharmacy call with any other refills you may need.  Please continue your efforts at being more active, low cholesterol diet, and weight control.  Please keep your appointments with your specialists as you may have planned - Dr Posey Pronto for kidneys  Please make an Appointment to return in 6 months, or sooner if needed

## 2020-03-20 ENCOUNTER — Encounter: Payer: Self-pay | Admitting: Internal Medicine

## 2020-03-20 NOTE — Assessment & Plan Note (Addendum)

## 2020-03-20 NOTE — Assessment & Plan Note (Signed)
stable overall by history and exam, recent data reviewed with pt, and pt to continue medical treatment as before,  to f/u any worsening symptoms or concerns  

## 2020-03-20 NOTE — Assessment & Plan Note (Signed)
Asympt, but will need reduced atenolol to 25 mg daily, cont to f/u bp and HR at home, and call in 7 days if still < 50

## 2020-03-22 ENCOUNTER — Other Ambulatory Visit: Payer: Self-pay | Admitting: Internal Medicine

## 2020-03-22 NOTE — Telephone Encounter (Signed)
Please refill as per office routine med refill policy (all routine meds refilled for 3 mo or monthly per pt preference up to one year from last visit, then month to month grace period for 3 mo, then further med refills will have to be denied)  

## 2020-05-18 ENCOUNTER — Ambulatory Visit (INDEPENDENT_AMBULATORY_CARE_PROVIDER_SITE_OTHER): Payer: Commercial Managed Care - PPO | Admitting: Obstetrics and Gynecology

## 2020-05-18 ENCOUNTER — Encounter: Payer: Self-pay | Admitting: Obstetrics and Gynecology

## 2020-05-18 ENCOUNTER — Encounter: Payer: Commercial Managed Care - PPO | Admitting: Gynecology

## 2020-05-18 ENCOUNTER — Other Ambulatory Visit: Payer: Self-pay

## 2020-05-18 VITALS — BP 122/82 | Ht 65.0 in | Wt 190.0 lb

## 2020-05-18 DIAGNOSIS — Z01411 Encounter for gynecological examination (general) (routine) with abnormal findings: Secondary | ICD-10-CM

## 2020-05-18 DIAGNOSIS — N906 Unspecified hypertrophy of vulva: Secondary | ICD-10-CM | POA: Diagnosis not present

## 2020-05-18 DIAGNOSIS — Z124 Encounter for screening for malignant neoplasm of cervix: Secondary | ICD-10-CM

## 2020-05-18 NOTE — Progress Notes (Signed)
Tara [name] 1950-10-02 [id number]  SUBJECTIVE:  69 y.o. G0P0 female here for a breast and pelvic exam and Pap smear. She has no gynecologic concerns.  Current Outpatient Medications  Medication Sig Dispense Refill  . amoxicillin (AMOXIL) 500 MG tablet Prior to dental appointments    . antiseptic oral rinse (BIOTENE) LIQD 15 mLs by Mouth Rinse route 2 (two) times daily as needed for dry mouth.    Marland Kitchen aspirin EC 81 MG tablet Take 81 mg by mouth daily.    . baclofen (LIORESAL) 10 MG tablet Take 10 mg by mouth 2 (two) times daily.    . brimonidine-timolol (COMBIGAN) 0.2-0.5 % ophthalmic solution     . clorazepate (TRANXENE) 7.5 MG tablet Take 7.5 mg by mouth 3 (three) times daily.    Marland Kitchen desvenlafaxine (PRISTIQ) 100 MG 24 hr tablet Take 100 mg by mouth at bedtime.    Marland Kitchen diltiazem (DILT-XR) 240 MG 24 hr capsule TAKE 1 CAPSULE(240 MG) BY MOUTH DAILY 90 capsule 3  . ferrous sulfate 325 (65 FE) MG tablet Take 325 mg by mouth daily with breakfast.    . furosemide (LASIX) 40 MG tablet Take 1 tablet (40 mg total) by mouth 2 (two) times daily. (Patient taking differently: Take 40 mg by mouth daily. ) 30 tablet 0  . hydrALAZINE (APRESOLINE) 100 MG tablet Take 1 tablet (100 mg total) by mouth every 8 (eight) hours. (Patient taking differently: Take 100 mg by mouth 3 (three) times daily. )    . latanoprost (XALATAN) 0.005 % ophthalmic solution Place 1 drop into both eyes at bedtime.    . Melatonin 5 MG TABS Take 10 mg by mouth at bedtime.    . Multiple Vitamins-Minerals (MULTIVITAMIN WITH MINERALS) tablet Take 1 tablet by mouth daily.    Marland Kitchen olmesartan (BENICAR) 20 MG tablet Take 20 mg by mouth daily.    . Omega 3 1200 MG CAPS Take 2,400 mg by mouth at bedtime.    Marland Kitchen PREVIDENT 5000 DRY MOUTH 1.1 % GEL dental gel See admin instructions.    . zaleplon (SONATA) 10 MG capsule Take 10 mg by mouth at bedtime as needed for sleep.    Marland Kitchen amphetamine-dextroamphetamine (ADDERALL) 30 MG tablet Take 30 mg by mouth 2 (two)  times daily. (Patient not taking: Reported on 05/18/2020)     No current facility-administered medications for this visit.   Allergies: Fluoxetine hcl, Chocolate, Floxin [ocuflox], Other, and Sulfonamide derivatives  No LMP recorded. Patient is postmenopausal.  Past medical history,surgical history, problem list, medications, allergies, family history and social history were all reviewed and documented as reviewed in the EPIC chart.  GYN ROS: no abnormal bleeding, pelvic pain or discharge, no breast pain or new or enlarging lumps on self exam.  No dysuria, urinary frequency, pain with urination, cloudy/malodorous urine.   OBJECTIVE:  BP 122/82   Ht 5\' 5"  (1.651 m)   Wt 190 lb (86.2 kg)   BMI 31.62 kg/m  The patient appears well, alert, oriented, in no distress.  BREAST EXAM: breasts appear normal, no suspicious masses, no skin or nipple changes or axillary nodes  PELVIC EXAM: VULVA: Right labia majora is symmetrically and significantly larger than the left with noted asymmetry compared to the left, palpation of the right labia majora indicates it is soft tissue, with Valsalva/cough there is no palpable hernia, otherwise normal appearing vulva with atrophic change, no masses, tenderness or lesions, VAGINA: normal appearing vagina with atrophic change, normal color and discharge, no lesions, CERVIX:  Long Pederson speculum used, normal appearing atrophic cervix without discharge or lesions, UTERUS: Difficult to palpate but no notable tenderness or abnormality, ADNEXA: nontender and no palpable masses, PAP: Pap smear done today, thin-prep method  Chaperone: [name] present during the examination  ASSESSMENT:  69 y.o. G0P0 here for a breast and pelvic exam  PLAN:   1. Postmenopausal.  No significant hot flashes or night sweats.  No vaginal bleeding. 2. Pap smear/HPV 2018.  No significant history of abnormal Pap smears.  Discussed current screening intervals and guidelines and she would  like to continue with the Pap smear screening so we did collect that today. 3.  Right labial enlargement/asymmetry.  Not noted on her previous exam notes.  No palpable abnormalities on the examination.  It is not bothering her.  We did show her the finding on the hand-held mirror.  She has lost over 40 pounds in the past few years so perhaps this is just a remnant of an asymmetrical loss of adiposity.  I will have staff contact her to schedule an ultrasound of the area to more definitively rule out underlying cyst or hernia. 4. Mammogram 04/2019.  Normal breast exam today.  She is reminded to schedule an annual mammogram this year as she is due.   5. Colonoscopy 2020.  She will follow up at the interval recommended by her GI specialist.  6. DEXA 2019 was normal.   Next DEXA recommended at age 61. 47. Health maintenance.  No labs today as she normally has these completed elsewhere.  Return annually or sooner, prn.  Joseph Pierini MD 05/18/20

## 2020-05-19 ENCOUNTER — Telehealth: Payer: Self-pay | Admitting: *Deleted

## 2020-05-19 DIAGNOSIS — N906 Unspecified hypertrophy of vulva: Secondary | ICD-10-CM

## 2020-05-19 NOTE — Telephone Encounter (Signed)
-----   Message from Joseph Pierini, MD sent at 05/18/2020  4:41 PM EDT ----- Regarding: Labial ultrasound Hi, this patient has a newly noted right labia majora asymmetry.  I was wondering if we can get her a ultrasound of the right labia majora to rule out any underlying cyst or hernia.  Thank you

## 2020-05-19 NOTE — Telephone Encounter (Signed)
Called patient and ultrasound appt is Monday 11/22 @ 3:30 and to see Dr Delilah Shan after.

## 2020-05-19 NOTE — Telephone Encounter (Signed)
Butch Penny see the below I spoke with Gerri (ultrasound tech) and was informed she can do ultrasound here. She gave me the imaging order # [id number] and told me to enter the below in the note section. Order placed, will route to appointments to schedule this with patient.

## 2020-05-20 LAB — PAP IG W/ RFLX HPV ASCU

## 2020-05-24 ENCOUNTER — Other Ambulatory Visit: Payer: Self-pay

## 2020-05-24 ENCOUNTER — Ambulatory Visit (INDEPENDENT_AMBULATORY_CARE_PROVIDER_SITE_OTHER): Payer: Commercial Managed Care - PPO | Admitting: Podiatry

## 2020-05-24 DIAGNOSIS — L84 Corns and callosities: Secondary | ICD-10-CM | POA: Diagnosis not present

## 2020-05-24 DIAGNOSIS — E1151 Type 2 diabetes mellitus with diabetic peripheral angiopathy without gangrene: Secondary | ICD-10-CM

## 2020-05-24 DIAGNOSIS — E1169 Type 2 diabetes mellitus with other specified complication: Secondary | ICD-10-CM | POA: Diagnosis not present

## 2020-05-24 DIAGNOSIS — B351 Tinea unguium: Secondary | ICD-10-CM

## 2020-05-24 NOTE — Progress Notes (Signed)
  Subjective:  Patient ID: Tara [name], female    DOB: Jun 09, 1951,  MRN: [id number]  Chief Complaint  Patient presents with  . Nail Problem    Nail trim 1-5 bilateral  . Callouses    Left sub 5th callous   69 y.o. female presents with the above complaint. History confirmed with patient. States the callus on the left foot is not hurting her  Objective:  Physical Exam: warm, good capillary refill, nail exam onychomycosis of the toenails, no trophic changes or ulcerative lesions, normal DP and non-palpable PT pulses, and normal sensory exam. Left Foot: HPK submet 5    Assessment:   1. Onychomycosis of multiple toenails with type 2 diabetes mellitus and peripheral angiopathy ([place])   2. Diabetes mellitus type 2 with peripheral artery disease ([facility])   3. Callus    Plan:  Patient was evaluated and treated and all questions answered.  Onychomycosis, Diabetes and PAD -Patient is diabetic with a qualifying condition for at risk foot care.   Procedure: Nail Debridement Type of Debridement: manual, sharp debridement. Instrumentation: Nail nipper, rotary burr. Number of Nails: 10   Procedure: Paring of Lesion Rationale: painful hyperkeratotic lesion Type of Debridement: manual, sharp debridement. Instrumentation: 312 blade Number of Lesions: 1      No follow-ups on file.

## 2020-05-27 ENCOUNTER — Ambulatory Visit: Payer: Commercial Managed Care - PPO | Admitting: Podiatry

## 2020-06-06 ENCOUNTER — Other Ambulatory Visit: Payer: Self-pay

## 2020-06-06 ENCOUNTER — Encounter: Payer: Self-pay | Admitting: Obstetrics and Gynecology

## 2020-06-06 ENCOUNTER — Ambulatory Visit (INDEPENDENT_AMBULATORY_CARE_PROVIDER_SITE_OTHER): Payer: Commercial Managed Care - PPO

## 2020-06-06 ENCOUNTER — Ambulatory Visit (INDEPENDENT_AMBULATORY_CARE_PROVIDER_SITE_OTHER): Payer: Commercial Managed Care - PPO | Admitting: Obstetrics and Gynecology

## 2020-06-06 VITALS — BP 122/84

## 2020-06-06 DIAGNOSIS — N906 Unspecified hypertrophy of vulva: Secondary | ICD-10-CM

## 2020-06-06 NOTE — Progress Notes (Signed)
   Tara [name] 1965-06-22 [id number]  SUBJECTIVE:  69 y.o. G0P0 female presents for a labial ultrasound due to an asymmetrical enlargement of her right labia visualized on her recent examination.  Asymptomatic with no pain in the area.  OBJECTIVE:  BP 122/84  The patient appears well, alert, oriented, in no distress. PELVIC EXAM: Deferred.  Bilateral labial ultrasound Right and left labia show no acute difference in soft tissue, no underlying cyst or mass identified.  No evidence of hernia.  ASSESSMENT:  69 y.o. G0P0 here for labial ultrasound  PLAN:  She is reassured of the normal labial ultrasound and no evidence of underlying hernia or varicocele.  She will notify us if there is development of any symptoms.  Again, she has lost over 40 pounds in the past few years so perhaps this labial finding is just a remnant of an asymmetrical loss of adiposity.  Joseph Pierini MD 06/06/20

## 2020-07-20 ENCOUNTER — Other Ambulatory Visit: Payer: Self-pay | Admitting: Internal Medicine

## 2020-07-20 NOTE — Telephone Encounter (Signed)
Please refill as per office routine med refill policy (all routine meds refilled for 3 mo or monthly per pt preference up to one year from last visit, then month to month grace period for 3 mo, then further med refills will have to be denied)  

## 2020-08-30 ENCOUNTER — Other Ambulatory Visit: Payer: Self-pay

## 2020-08-30 ENCOUNTER — Ambulatory Visit (INDEPENDENT_AMBULATORY_CARE_PROVIDER_SITE_OTHER): Payer: Commercial Managed Care - PPO | Admitting: Podiatry

## 2020-08-30 DIAGNOSIS — E1169 Type 2 diabetes mellitus with other specified complication: Secondary | ICD-10-CM

## 2020-08-30 DIAGNOSIS — L84 Corns and callosities: Secondary | ICD-10-CM | POA: Diagnosis not present

## 2020-08-30 DIAGNOSIS — E1151 Type 2 diabetes mellitus with diabetic peripheral angiopathy without gangrene: Secondary | ICD-10-CM

## 2020-08-30 DIAGNOSIS — B351 Tinea unguium: Secondary | ICD-10-CM | POA: Diagnosis not present

## 2020-09-06 NOTE — Progress Notes (Signed)
HPI F never smoker followed for sarcoid, OSA/ failed CPAP, complicated by hypercalcemia, renal insufficency, glaucoma PFT 02/07/15-minimal obstructive airways disease, minimal diffusion defect, insignificant response to bronchodilator ACE in July, 2017 remained elevated 74 (8-52) NPSG  12/ 11/09 - AhI 1.4/hour I desaturation to 89%, body weight 245 lbs Unattended Home Sleep Test 03/12/17-AHI 27.6/hour, desaturation to 75%, body weight 220 pounds PFT 03/11/19- minimal obstruction, no resp to BD, likely mild restriction --------------------------------------------------------------------------  09/08/19-  70 year old female never smoker followed for Sarcoid, OSA/ oral appliance Dr Toy Cookey complicated by hypercalcemia, renal insufficiency, HBP, depression, glaucoma PFT 03/11/2019- ------F/u DOE/ OSA.Breathing is at patient's baseline.  Body weight today 199 lbs Had flu vax and 1 covid vax Roof leak led to ceiling collapse in December. Asbestos found in insulation. Had to be in temp housing x 3 weeks during remediation. Not needing rescue inhaler- no cough or wheeze. Complains of burning cheeks- unclear any connection to these home problems. No visible rash.  No adenopathy  CTa chest 06/21/2019- Lungs/Pleura: No focal airspace consolidation, pulmonary edema, pleural effusion or pneumothorax. No suspicious nodule or mass. Minimal dependent atelectasis. MPRESSION: 1. Negative for acute pulmonary embolus, pneumonia or other acute cardiopulmonary process. 2. Cardiomegaly.  09/07/20- 70 year old female never smoker followed for Sarcoid, Insomnia, OSA/ oral appliance Dr Toy Cookey complicated by hypercalcemia, renal insufficiency, HBP, depression, glaucoma Adderall 30 bid, Sonata 10,  Body weight today-190 lbs Covid vax-3 Moderna Flu vax-had -----Patient states that she has been having some discomfort with breathing. Not sleeping well, states she sleeps about 4 hours and has trouble going back to sleep,  still using oral device for OSA. Says oral appliance for OSA is not really comfortable and she would like to talk with Dr Redmond Baseman again about Dawna Part, now that she has lost about 40 lbs since last there. There are overlapping discomforts between headaches [facility], facial tics, and burning sensation in cheeks and inside nose she describes as "nerves pulling". No longer on Adderall at all. Dr [facility] indicated she would prescribe Lunesta. Sonata isn't lasting long enough. Discussed relative half-lives of these meds.   ROS- see HPI  + = positive Constitutional:     weight loss, no-night sweats, fevers, chills, + fatigue, lassitude. HEENT:   + headaches, +difficulty swallowing, tooth/dental problems, sore throat,       No-  sneezing, itching, ear ache, nasal congestion, post nasal drip,  CV:  No- chest pain, no-orthopnea, PND, swelling in lower extremities, anasarca,dizziness, +palpitations  Resp: + shortness of breath with exertion or at rest.              No-   productive cough,  + non-productive cough,  No- coughing up of blood.              No- wheezing.   Skin: No-   rash or lesions.  +cheeks burn GI:  No-   heartburn, indigestion, abdominal pain, nausea, vomiting,  GU:  MS:  +  joint pain or swelling.   Neuro-     +facial tics Psych:  No- change in mood or affect. No depression or anxiety.  No memory loss.  OBJ- Physical Exam   General- Alert, Oriented, Affect-appropriate, Distress- none acute, + overweight Skin- rash-none, lesions- none, excoriation- none,  Lymphadenopathy- none Head- atraumatic            Eyes- +continues frequent blinking             Ears- Hearing, canals-normal  Nose- Clear, no-Septal dev, mucus, polyps, erosion, perforation             Throat- Mallampati III, mucosa clear , drainage- none, tonsils- atrophic Neck- flexible , trachea midline, no stridor , thyroid nl, carotid no bruit Chest - symmetrical excursion ,  unlabored           Heart/CV- RRR + extra beats , no murmur , no gallop  , no rub, nl s1 s2                           - JVD- none ,, varices- none           Lung- clear to P&A, wheeze- none, cough- none , dullness-none, rub- none           Chest wall-  Abd-  Br/ Gen/ Rectal- Not done, not indicated Extrem- cyanosis- none, clubbing, none, atrophy- none, strength- nl Neuro- +blinking, grimacing, ? Tics or frontal lobe ?

## 2020-09-07 ENCOUNTER — Other Ambulatory Visit: Payer: Self-pay

## 2020-09-07 ENCOUNTER — Encounter: Payer: Self-pay | Admitting: Internal Medicine

## 2020-09-07 ENCOUNTER — Ambulatory Visit (INDEPENDENT_AMBULATORY_CARE_PROVIDER_SITE_OTHER): Payer: Commercial Managed Care - PPO | Admitting: Internal Medicine

## 2020-09-07 VITALS — BP 118/70 | HR 55 | Temp 97.4°F | Ht 66.0 in | Wt 190.4 lb

## 2020-09-07 DIAGNOSIS — J309 Allergic rhinitis, unspecified: Secondary | ICD-10-CM | POA: Diagnosis not present

## 2020-09-07 DIAGNOSIS — G4733 Obstructive sleep apnea (adult) (pediatric): Secondary | ICD-10-CM | POA: Diagnosis not present

## 2020-09-07 NOTE — Assessment & Plan Note (Signed)
It sounds as if she relates the burning sensation in her nose to "nerves pulling", connected to her facial tics, which are obvious, and to burning sensation in cheeks for which I see no physical sign. Plan- she will keep working with her headache doctors. I suggested she try otc nasal saline gel to see if that helps any in her nose.

## 2020-09-07 NOTE — Patient Instructions (Signed)
Suggest you try otc nasal saline gel in your nose to see if it helps the burning. Try a different drug store if yours doesn't have it. Common store brands, [facility], [employer] all make it.  Ask Dr Starleen Arms office about the status of your [facility] prescription.  Order- referral to ENT/ Dr Redmond Baseman   To discuss Inspire for OSA  Please call if we can help

## 2020-09-07 NOTE — Assessment & Plan Note (Signed)
She has lost weight and asks about looking into Inspire again Plan- refer back to Dr Redmond Baseman to reconsider Dawna Part

## 2020-09-12 NOTE — Progress Notes (Signed)
  Subjective:  Patient ID: Tara [name], female    DOB: 03/22/1951,  MRN: [id number]  Chief Complaint  Patient presents with  . debridmeent    RFC   70 y.o. female presents with the above complaint. History confirmed with patient   Objective:  Physical Exam: warm, good capillary refill, nail exam onychomycosis of the toenails, no trophic changes or ulcerative lesions, normal DP and non-palpable PT pulses, and normal sensory exam. Left Foot: HPK submet 5    Assessment:   1. Onychomycosis of multiple toenails with type 2 diabetes mellitus and peripheral angiopathy ([place])   2. Diabetes mellitus type 2 with peripheral artery disease ([facility])   3. Callus    Plan:  Patient was evaluated and treated and all questions answered.  Onychomycosis, Diabetes and PAD -Patient is diabetic with a qualifying condition for at risk foot care.  Procedure: Nail Debridement Type of Debridement: manual, sharp debridement. Instrumentation: Nail nipper, rotary burr. Number of Nails: 10   Procedure: Paring of Lesion Rationale: painful hyperkeratotic lesion Type of Debridement: manual, sharp debridement. Instrumentation: 312 blade Number of Lesions: 1    No follow-ups on file.

## 2020-09-15 ENCOUNTER — Ambulatory Visit: Payer: Commercial Managed Care - PPO | Admitting: Internal Medicine

## 2020-10-04 DIAGNOSIS — Z6831 Body mass index (BMI) 31.0-31.9, adult: Secondary | ICD-10-CM | POA: Insufficient documentation

## 2020-10-06 ENCOUNTER — Encounter: Payer: Self-pay | Admitting: Internal Medicine

## 2020-10-06 ENCOUNTER — Other Ambulatory Visit: Payer: Self-pay

## 2020-10-06 ENCOUNTER — Ambulatory Visit (INDEPENDENT_AMBULATORY_CARE_PROVIDER_SITE_OTHER): Payer: Commercial Managed Care - PPO | Admitting: Internal Medicine

## 2020-10-06 VITALS — BP 120/76 | HR 54 | Temp 97.7°F | Ht 66.0 in | Wt 191.0 lb

## 2020-10-06 DIAGNOSIS — T380X5D Adverse effect of glucocorticoids and synthetic analogues, subsequent encounter: Secondary | ICD-10-CM | POA: Diagnosis not present

## 2020-10-06 DIAGNOSIS — M25561 Pain in right knee: Secondary | ICD-10-CM

## 2020-10-06 DIAGNOSIS — E538 Deficiency of other specified B group vitamins: Secondary | ICD-10-CM

## 2020-10-06 DIAGNOSIS — E7800 Pure hypercholesterolemia, unspecified: Secondary | ICD-10-CM

## 2020-10-06 DIAGNOSIS — E099 Drug or chemical induced diabetes mellitus without complications: Secondary | ICD-10-CM | POA: Diagnosis not present

## 2020-10-06 DIAGNOSIS — N184 Chronic kidney disease, stage 4 (severe): Secondary | ICD-10-CM | POA: Diagnosis not present

## 2020-10-06 DIAGNOSIS — I10 Essential (primary) hypertension: Secondary | ICD-10-CM

## 2020-10-06 DIAGNOSIS — Z0001 Encounter for general adult medical examination with abnormal findings: Secondary | ICD-10-CM | POA: Diagnosis not present

## 2020-10-06 DIAGNOSIS — E559 Vitamin D deficiency, unspecified: Secondary | ICD-10-CM

## 2020-10-06 LAB — TSH: TSH: 1.83 u[IU]/mL (ref 0.35–4.50)

## 2020-10-06 LAB — LIPID PANEL
Cholesterol: 124 mg/dL (ref 0–200)
HDL: 59.1 mg/dL (ref >39.00)
LDL Cholesterol: 51 mg/dL (ref 0–99)
NonHDL: 65.28
Total CHOL/HDL Ratio: 2
Triglycerides: 71 mg/dL (ref 0.0–149.0)
VLDL: 14.2 mg/dL (ref 0.0–40.0)

## 2020-10-06 LAB — MICROALBUMIN / CREATININE URINE RATIO
Creatinine,U: 15.4 mg/dL
Microalb Creat Ratio: 6.4 mg/g (ref 0.0–30.0)
Microalb, Ur: 1 mg/dL (ref 0.0–1.9)

## 2020-10-06 LAB — CBC WITH DIFFERENTIAL/PLATELET
Basophils Absolute: 0.1 10*3/uL (ref 0.0–0.1)
Basophils Relative: 1.1 % (ref 0.0–3.0)
Eosinophils Absolute: 0.4 10*3/uL (ref 0.0–0.7)
Eosinophils Relative: 5 % (ref 0.0–5.0)
HCT: 43.7 % (ref 36.0–46.0)
Hemoglobin: 14.4 g/dL (ref 12.0–15.0)
Lymphocytes Relative: 18.7 % (ref 12.0–46.0)
Lymphs Abs: 1.6 10*3/uL (ref 0.7–4.0)
MCHC: 32.9 g/dL (ref 30.0–36.0)
MCV: 84.5 fl (ref 78.0–100.0)
Monocytes Absolute: 0.7 10*3/uL (ref 0.1–1.0)
Monocytes Relative: 8.4 % (ref 3.0–12.0)
Neutro Abs: 5.5 10*3/uL (ref 1.4–7.7)
Neutrophils Relative %: 66.8 % (ref 43.0–77.0)
Platelets: 239 10*3/uL (ref 150.0–400.0)
RBC: 5.17 Mil/uL — ABNORMAL HIGH (ref 3.87–5.11)
RDW: 15.5 % (ref 11.5–15.5)
WBC: 8.3 10*3/uL (ref 4.0–10.5)

## 2020-10-06 LAB — BASIC METABOLIC PANEL
BUN: 35 mg/dL — ABNORMAL HIGH (ref 6–23)
CO2: 32 mEq/L (ref 19–32)
Calcium: 9.5 mg/dL (ref 8.4–10.5)
Chloride: 100 mEq/L (ref 96–112)
Creatinine, Ser: 1.27 mg/dL — ABNORMAL HIGH (ref 0.40–1.20)
GFR: 43.65 mL/min — ABNORMAL LOW (ref >60.00)
Glucose, Bld: 92 mg/dL (ref 70–99)
Potassium: 3.7 mEq/L (ref 3.5–5.1)
Sodium: 140 mEq/L (ref 135–145)

## 2020-10-06 LAB — HEPATIC FUNCTION PANEL
ALT: 22 U/L (ref 0–35)
AST: 23 U/L (ref 0–37)
Albumin: 4.3 g/dL (ref 3.5–5.2)
Alkaline Phosphatase: 117 U/L (ref 39–117)
Bilirubin, Direct: 0.1 mg/dL (ref 0.0–0.3)
Total Bilirubin: 0.5 mg/dL (ref 0.2–1.2)
Total Protein: 7.2 g/dL (ref 6.0–8.3)

## 2020-10-06 LAB — VITAMIN D 25 HYDROXY (VIT D DEFICIENCY, FRACTURES): VITD: 48.13 ng/mL (ref 30.00–100.00)

## 2020-10-06 LAB — URINALYSIS, ROUTINE W REFLEX MICROSCOPIC
Bilirubin Urine: NEGATIVE
Hgb urine dipstick: NEGATIVE
Ketones, ur: NEGATIVE
Leukocytes,Ua: NEGATIVE
Nitrite: NEGATIVE
RBC / HPF: NONE SEEN (ref 0–?)
Specific Gravity, Urine: 1.01 (ref 1.000–1.030)
Total Protein, Urine: NEGATIVE
Urine Glucose: NEGATIVE
Urobilinogen, UA: 0.2 (ref 0.0–1.0)
pH: 6.5 (ref 5.0–8.0)

## 2020-10-06 LAB — VITAMIN B12: Vitamin B-12: 588 pg/mL (ref 211–911)

## 2020-10-06 LAB — PHOSPHORUS: Phosphorus: 4.3 mg/dL (ref 2.3–4.6)

## 2020-10-06 LAB — HEMOGLOBIN A1C: Hgb A1c MFr Bld: 5.9 % (ref 4.6–6.5)

## 2020-10-06 NOTE — Progress Notes (Signed)
Patient ID: Tara [name], female   DOB: [birth year], 69 y.o.   MRN: [id number]         Chief Complaint:: wellness exam and Follow-up  right knee pain       HPI:  Tara [name] is a 70 y.o. female here for wellness exam; due for pneumovax, mammogram, pap smear, eye exam late in 2022, and colonoscopy 2023, o/w up to date with preventive referral and immnunizations.                          Also c/o 2 wks onset worsening right knee pain, mod, intermittent but makes her limp somewhat to walk, seemed to started after a trip to [place] with increased activity but just not improving with mild swelling, but no giveaways or falls.  Worse to walk, better to rest.  Nothing else makes better or worse.  Tylenol not helping.  Conts to see Dr [facility] psychiatry; also having botox recurrently for migraine that seems to help - has f/u first wk of April.  Sees Dr Posey Pronto renal for worsening ckd,.  Pt denies chest pain, increased sob or doe, wheezing, orthopnea, PND, increased LE swelling, palpitations, dizziness or syncope.   Pt denies polydipsia, polyuria, Denies new worsening focal neuro s/s,   Pt denies fever, wt loss, night sweats, loss of appetite, or other constitutional symptoms  No other new complaints   Wt Readings from Last 3 Encounters:  10/06/20 191 lb (86.6 kg)  09/07/20 190 lb 6.4 oz (86.4 kg)  05/18/20 190 lb (86.2 kg)   BP Readings from Last 3 Encounters:  10/06/20 120/76  09/07/20 118/70  06/06/20 122/84   Immunization History  Administered Date(s) Administered  . Fluad Quad(high Dose 65+) 03/11/2019, 05/27/2020  . Influenza Split 03/29/2013  . Influenza Whole 05/09/1999, 05/19/2007, 04/01/2008, 04/21/2009, 03/26/2011  . Influenza, High Dose Seasonal PF 03/27/2018  . Influenza,inj,Quad PF,6+ Mos 03/25/2014, 03/27/2016, 06/13/2017  . Influenza-Unspecified 04/15/2012, 04/14/2015  . Moderna Sars-Covid-2 Vaccination 08/28/2019, 09/25/2019, 05/27/2020  . Pneumococcal Conjugate-13 03/18/2020  .  Pneumococcal Polysaccharide-23 06/05/2013  . Td 07/16/1992, 10/21/2008  . Tdap 03/18/2020  . Zoster 12/14/2012   There are no preventive care reminders to display for this patient.    Past Medical History:  Diagnosis Date  . Anemia   . ANKLE PAIN, LEFT 04/01/2008  . ANXIETY 04/17/2007  . Arthritis   . Chronic kidney disease    stage 3  . Colon polyp   . COLONIC POLYPS, HX OF 08/01/2007  . CONTUSIONS, MULTIPLE 04/01/2009  . DEPRESSION 04/17/2007  . DIZZINESS 08/01/2007  . DYSPNEA 08/01/2007   with exertion  . Enlargement of lymph nodes 08/13/2007  . Excessive involuntary blinking    per pt,going on since 2018  . GLUCOSE INTOLERANCE 08/01/2007  . 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 HYPERLIPIDEMIA 08/01/2007   no meds  . HYPERSOMNIA 07/28/2008  . HYPERTENSION 04/17/2007  . Impaired glucose tolerance 03/23/2011  . JOINT EFFUSION, LEFT KNEE 06/02/2010  . Loose body in knee 04/01/2009   pt not sure?  . Metabolic encephalopathy 37/04/6268-48/5462  . Migraines    "stopped 3-4 yr ago" (07/23/2012)  . Morbid obesity ([place]) 04/20/2007  . OTHER DISEASES OF LUNG NOT ELSEWHERE CLASSIFIED 08/01/2007  . Pain in joint, lower leg 04/01/2009  . PERIPHERAL EDEMA 04/21/2009  . Pre-diabetes   . Sarcoidosis 09/25/2007  . SHOULDER PAIN, LEFT 04/01/2009  . Sleep apnea    does not use Cpap   Past  Surgical History:  Procedure Laterality Date  . BREAST SURGERY     Biopsy benign/bil breaST  . CATARACT EXTRACTION     RIGHT EYE  . COLONOSCOPY    . COMBINED MEDIASTINOSCOPY AND BRONCHOSCOPY  08/2007  . COMBINED MEDIASTINOSCOPY AND BRONCHOSCOPY  2009  . Dental implant    . FRACTURE SURGERY  ?02/1997   "left upper arm; put rod in" (07/23/2012)  . GUM SURGERY  2000-?2009   "several ORs; soft tissue graft; took material from roof of mouth" (07/23/2012  . HUMERUS SURGERY Left 1998   rod insertion  . KNEE ARTHROSCOPY  03/2004; 06/2009   "right; left" Dr. Theda Sers  . KNEE SURGERY  2012   ARTHROSCOPIC LEFT KNEE   . LYMPH NODE BIOPSY  ~ 2009   "for sarcoidosis; don't know exactly which nodes" (07/23/2102)  . MYOMECTOMY  1994   Open  . REFRACTIVE SURGERY  08/1998   "both eyes" (07/23/2012)  . REFRACTIVE SURGERY  2000   Bil  . TOTAL KNEE ARTHROPLASTY Left 09/24/2016   Procedure: TOTAL KNEE ARTHROPLASTY;  Surgeon: Dorna Leitz, MD;  Location: [facility];  Service: Orthopedics;  Laterality: Left;    reports that she has never smoked. She has never used smokeless tobacco. She reports current alcohol use. She reports that she does not use drugs. family history includes Asthma in her sister; Diabetes in her mother; Gout in her sister; Heart disease (age of onset: 12) in her father; Hypertension in her father, mother, and sister; Stroke in her mother. Allergies  Allergen Reactions  . Fluoxetine Hcl Other (See Comments)    (PROZAC) Suicidal thoughts  . Sulfa Antibiotics Rash  . Chocolate Other (See Comments)    SOMETIMES CAUSES SEVERE HEADACHES  . Floxin [Ocuflox] Anxiety    shaky  . Other Nausea And Vomiting    INSTANT ICED TEA PACKETS  . Sulfonamide Derivatives Rash   Current Outpatient Medications on File Prior to Visit  Medication Sig Dispense Refill  . amphetamine-dextroamphetamine (ADDERALL) 30 MG tablet Take 30 mg by mouth 2 (two) times daily.     Marland Kitchen antiseptic oral rinse (BIOTENE) LIQD 15 mLs by Mouth Rinse route 2 (two) times daily as needed for dry mouth.    Marland Kitchen aspirin EC 81 MG tablet Take 81 mg by mouth daily.    Marland Kitchen atenolol (TENORMIN) 50 MG tablet TAKE 1 TABLET(50 MG) BY MOUTH DAILY 90 tablet 0  . baclofen (LIORESAL) 10 MG tablet Take 10 mg by mouth 2 (two) times daily.    . brimonidine-timolol (COMBIGAN) 0.2-0.5 % ophthalmic solution     . carboxymethylcellulose (REFRESH PLUS) 0.5 % SOLN with meals as needed.    . clorazepate (TRANXENE) 7.5 MG tablet Take 7.5 mg by mouth 3 (three) times daily.    Marland Kitchen desvenlafaxine (PRISTIQ) 100 MG 24 hr tablet Take 100 mg by mouth at bedtime.    Marland Kitchen dextromethorphan  15 MG/5ML syrup 15 mLs.    Marland Kitchen diltiazem (DILT-XR) 240 MG 24 hr capsule TAKE 1 CAPSULE(240 MG) BY MOUTH DAILY 90 capsule 3  . diltiazem (TIAZAC) 240 MG 24 hr capsule TAKE 1 CAPSULE(240 MG) BY MOUTH DAILY    . EMGALITY 120 MG/ML SOAJ SMARTSIG:1 Milliliter(s) SUB-Q    . ferrous sulfate 325 (65 FE) MG tablet Take 325 mg by mouth daily with breakfast.    . furosemide (LASIX) 40 MG tablet Take 1 tablet (40 mg total) by mouth 2 (two) times daily. (Patient taking differently: Take 40 mg by mouth daily.) 30 tablet  0  . hydrALAZINE (APRESOLINE) 100 MG tablet Take 1 tablet (100 mg total) by mouth every 8 (eight) hours. (Patient taking differently: Take 100 mg by mouth 3 (three) times daily.)    . latanoprost (XALATAN) 0.005 % ophthalmic solution Place 1 drop into both eyes at bedtime.    . Melatonin 5 MG TABS Take 10 mg by mouth at bedtime.    . Multiple Vitamins-Minerals (MULTIVITAMIN WITH MINERALS) tablet Take 1 tablet by mouth daily.    Marland Kitchen olmesartan (BENICAR) 20 MG tablet Take 20 mg by mouth daily.    . Olmesartan-amLODIPine-HCTZ 20-5-12.5 MG TABS     . Omega 3 1200 MG CAPS Take 2,400 mg by mouth at bedtime.    Marland Kitchen PREVIDENT 5000 DRY MOUTH 1.1 % GEL dental gel See admin instructions.    . SUMAtriptan & Camphor-Menthol 50 & 4-10 MG & % THPK     . SUMAtriptan (IMITREX) 50 MG tablet Take 50 mg by mouth 2 (two) times daily as needed.    . UBRELVY 50 MG TABS Take by mouth.    . zaleplon (SONATA) 10 MG capsule Take 10 mg by mouth at bedtime as needed for sleep.    Marland Kitchen amoxicillin (AMOXIL) 500 MG tablet Prior to dental appointments (Patient not taking: Reported on 10/06/2020)    . BOTOX 200 units SOLR      No current facility-administered medications on file prior to visit.        ROS:  All others reviewed and negative.  Objective        PE:  BP 120/76   Pulse (!) 54   Temp 97.7 F (36.5 C) (Oral)   Ht 5\' 6"  (1.676 m)   Wt 191 lb (86.6 kg)   SpO2 99%   BMI 30.83 kg/m                 Constitutional:  Pt appears in NAD               HENT: Head: NCAT.                Right Ear: External ear normal.                 Left Ear: External ear normal.                Eyes: . Pupils are equal, round, and reactive to light. Conjunctivae and EOM are normal               Nose: without d/c or deformity               Neck: Neck supple. Gross normal ROM               Cardiovascular: Normal rate and regular rhythm.                 Pulmonary/Chest: Effort normal and breath sounds without rales or wheezing.                Abd:  Soft, NT, ND, + BS, no organomegaly               Neurological: Pt is alert. At baseline orientation, motor grossly intact               Skin: Skin is warm. No rashes, no other new lesions, LE edema - none               Right knee with trace effusion, decreased rom, and  crepitus               Psychiatric: Pt behavior is normal without agitation   Micro: none  Cardiac tracings I have personally interpreted today:  none  Pertinent Radiological findings (summarize): none   Lab Results  Component Value Date   WBC 8.3 10/06/2020   HGB 14.4 10/06/2020   HCT 43.7 10/06/2020   PLT 239.0 10/06/2020   GLUCOSE 92 10/06/2020   CHOL 124 10/06/2020   TRIG 71.0 10/06/2020   HDL 59.10 10/06/2020   LDLDIRECT 50.0 09/24/2017   LDLCALC 51 10/06/2020   ALT 22 10/06/2020   AST 23 10/06/2020   NA 140 10/06/2020   K 3.7 10/06/2020   CL 100 10/06/2020   CREATININE 1.27 (H) 10/06/2020   BUN 35 (H) 10/06/2020   CO2 32 10/06/2020   TSH 1.83 10/06/2020   INR 0.98 09/17/2016   HGBA1C 5.9 10/06/2020   MICROALBUR 1.0 10/06/2020   Assessment/Plan:  Tara [name] is a 70 y.o. White or Caucasian [1] female with  has a past medical history of Anemia, ANKLE PAIN, LEFT (04/01/2008), ANXIETY (04/17/2007), Arthritis, Chronic kidney disease, Colon polyp, COLONIC POLYPS, HX OF (08/01/2007), CONTUSIONS, MULTIPLE (04/01/2009), DEPRESSION (04/17/2007), DIZZINESS (08/01/2007), DYSPNEA (08/01/2007), Enlargement of  lymph nodes (08/13/2007), Excessive involuntary blinking, GLUCOSE INTOLERANCE (08/01/2007), Hypercalcemia due to sarcoidosis (2014), HYPERLIPIDEMIA (08/01/2007), HYPERSOMNIA (07/28/2008), HYPERTENSION (04/17/2007), Impaired glucose tolerance (03/23/2011), JOINT EFFUSION, LEFT KNEE (06/02/2010), Loose body in knee (26/41/5830), Metabolic encephalopathy (94/01/6807-81/1031), Migraines, Morbid obesity ([place]) (04/20/2007), OTHER DISEASES OF LUNG NOT ELSEWHERE CLASSIFIED (08/01/2007), Pain in joint, lower leg (04/01/2009), PERIPHERAL EDEMA (04/21/2009), Pre-diabetes, Sarcoidosis (09/25/2007), SHOULDER PAIN, LEFT (04/01/2009), and Sleep apnea.  Encounter for well adult exam with abnormal findings Age and sex appropriate education and counseling updated with regular exercise and diet Referrals for preventative services - none needed at this time Immunizations addressed - none needed at this time Smoking counseling  - none needed Evidence for depression or other mood disorder - none significant Most recent labs reviewed. I have personally reviewed and have noted: 1) the patient's medical and social history 2) The patient's current medications and supplements 3) The patient's height, weight, and BMI have been recorded in the chart   CKD (chronic kidney disease) stage 4, GFR 15-29 ml/min (HCC) Lab Results  Component Value Date   CREATININE 1.27 (H) 10/06/2020   Stable overall, cont to avoid nephrotoxins, f/u Dr patel as planned  Steroid-induced diabetes Lab Results  Component Value Date   HGBA1C 5.9 10/06/2020   Stable, pt to continue current medical treatment  - diet, wt control   Pain in joint, lower leg Now worsening right knee, djd related, for volt gel prn, also asked her to f/u Dr Tamala Julian sport medicine  Essential hypertension BP Readings from Last 3 Encounters:  10/06/20 120/76  09/07/20 118/70  06/06/20 122/84   Stable, pt to continue medical treatment tenormin, diltazem,  hydralazine   Hyperlipidemia Lab Results  Component Value Date   LDLCALC 51 10/06/2020   Stable, pt to continue current low chol diet   Followup: Return in about 6 months (around 04/08/2021).  Cathlean Cower, MD 10/09/2020 11:45 AM [facility]

## 2020-10-06 NOTE — Patient Instructions (Signed)
Please see Dr Tamala Julian for the right knee pain at Sports Medicine  Please continue all other medications as before, and refills have been done if requested.  Please have the pharmacy call with any other refills you may need.  Please continue your efforts at being more active, low cholesterol diet, and weight control.  You are otherwise up to date with prevention measures today.  Please keep your appointments with your specialists as you may have planned - Dr Posey Pronto soon for kidney  Please go to the LAB at the blood drawing area for the tests to be done  You will be contacted by phone if any changes need to be made immediately.  Otherwise, you will receive a letter about your results with an explanation, but please check with MyChart first.  Please remember to sign up for MyChart if you have not done so, as this will be important to you in the future with finding out test results, communicating by private email, and scheduling acute appointments online when needed.  Please make an Appointment to return in 6 months, or sooner if needed, also with Lab Appointment for testing done 3-5 days before at the [facility] (so this is for TWO appointments - please see the scheduling desk as you leave)  Due to the ongoing Covid 19 pandemic, our lab now requires an appointment for any labs done at our office.  If you need labs done and do not have an appointment, please call our office ahead of time to schedule before presenting to the lab for your testing.

## 2020-10-09 ENCOUNTER — Encounter: Payer: Self-pay | Admitting: Internal Medicine

## 2020-10-09 LAB — PTH, INTACT AND CALCIUM
Calcium: 9.4 mg/dL (ref 8.6–10.4)
PTH: 33 pg/mL (ref 16–77)

## 2020-10-09 NOTE — Assessment & Plan Note (Signed)
Lab Results  Component Value Date   HGBA1C 5.9 10/06/2020   Stable, pt to continue current medical treatment  - diet, wt control

## 2020-10-09 NOTE — Assessment & Plan Note (Signed)
Lab Results  Component Value Date   CREATININE 1.27 (H) 10/06/2020   Stable overall, cont to avoid nephrotoxins, f/u Dr patel as planned

## 2020-10-09 NOTE — Assessment & Plan Note (Signed)
Age and sex appropriate education and counseling updated with regular exercise and diet Referrals for preventative services - none needed at this time Immunizations addressed - none needed at this time Smoking counseling  - none needed Evidence for depression or other mood disorder - none significant Most recent labs reviewed. I have personally reviewed and have noted: 1) the patient's medical and social history 2) The patient's current medications and supplements 3) The patient's height, weight, and BMI have been recorded in the chart

## 2020-10-09 NOTE — Assessment & Plan Note (Signed)
Lab Results  Component Value Date   LDLCALC 51 10/06/2020   Stable, pt to continue current low chol diet

## 2020-10-09 NOTE — Assessment & Plan Note (Signed)
BP Readings from Last 3 Encounters:  10/06/20 120/76  09/07/20 118/70  06/06/20 122/84   Stable, pt to continue medical treatment tenormin, diltazem, hydralazine

## 2020-10-09 NOTE — Assessment & Plan Note (Signed)
Now worsening right knee, djd related, for volt gel prn, also asked her to f/u Dr Tamala Julian sport medicine

## 2020-10-18 ENCOUNTER — Other Ambulatory Visit: Payer: Self-pay | Admitting: Internal Medicine

## 2020-10-18 NOTE — Telephone Encounter (Signed)
Please refill as per office routine med refill policy (all routine meds refilled for 3 mo or monthly per pt preference up to one year from last visit, then month to month grace period for 3 mo, then further med refills will have to be denied)  

## 2020-10-19 NOTE — Progress Notes (Signed)
[facility] [address] [place] Phone: [phone] Subjective:   I Tara [name] am serving as a Education administrator for Dr. Hulan Saas.  This visit occurred during the SARS-CoV-2 public health emergency.  Safety protocols were in place, including screening questions prior to the visit, additional usage of staff PPE, and extensive cleaning of exam room while observing appropriate contact time as indicated for disinfecting solutions.   I'm seeing this patient by the request  of:  Biagio Borg, MD  CC: Right knee pain  RDE:[id number]  Tara [name] is a 70 y.o. female coming in with complaint of right knee pain. Last seen in 2019 for ankle pain. Patient states she has discomfort more than pain. Patient states the knee is stiff. Sometimes the discomfort keeps her up at night. States that sometimes she has ankle pain. States the issue was caused after walking during a vacation. History of left knee replacement about 4 years ago and patient would like to avoid that on the right. States she has arthritis in the right knee.  Onset- December  Character- discomfort  Aggravating factors- sitting for long periods of time Reliving factors-  Therapies tried-  Severity- 2/10 at it worse     Patient had x-rays in 2015 showing the patient had extensive osteoarthritic changes of the knees bilaterally mostly of the medial compartment.  This was independently visualized by me today.  Past Medical History:  Diagnosis Date  . Anemia   . ANKLE PAIN, LEFT 04/01/2008  . ANXIETY 04/17/2007  . Arthritis   . Chronic kidney disease    stage 3  . Colon polyp   . COLONIC POLYPS, HX OF 08/01/2007  . CONTUSIONS, MULTIPLE 04/01/2009  . DEPRESSION 04/17/2007  . DIZZINESS 08/01/2007  . DYSPNEA 08/01/2007   with exertion  . Enlargement of lymph nodes 08/13/2007  . Excessive involuntary blinking    per pt,going on since 2018  . GLUCOSE INTOLERANCE 08/01/2007  . Hypercalcemia  due to sarcoidosis 2014  . HYPERLIPIDEMIA 08/01/2007   no meds  . HYPERSOMNIA 07/28/2008  . HYPERTENSION 04/17/2007  . Impaired glucose tolerance 03/23/2011  . JOINT EFFUSION, LEFT KNEE 06/02/2010  . Loose body in knee 04/01/2009   pt not sure?  . Metabolic encephalopathy 31/49/7026-37/8588  . Migraines    "stopped 3-4 yr ago" (07/23/2012)  . Morbid obesity ([place]) 04/20/2007  . OTHER DISEASES OF LUNG NOT ELSEWHERE CLASSIFIED 08/01/2007  . Pain in joint, lower leg 04/01/2009  . PERIPHERAL EDEMA 04/21/2009  . Pre-diabetes   . Sarcoidosis 09/25/2007  . SHOULDER PAIN, LEFT 04/01/2009  . Sleep apnea    does not use Cpap   Past Surgical History:  Procedure Laterality Date  . BREAST SURGERY     Biopsy benign/bil breaST  . CATARACT EXTRACTION     RIGHT EYE  . COLONOSCOPY    . COMBINED MEDIASTINOSCOPY AND BRONCHOSCOPY  08/2007  . COMBINED MEDIASTINOSCOPY AND BRONCHOSCOPY  2009  . Dental implant    . FRACTURE SURGERY  ?02/1997   "left upper arm; put rod in" (07/23/2012)  . GUM SURGERY  2000-?2009   "several ORs; soft tissue graft; took material from roof of mouth" (07/23/2012  . HUMERUS SURGERY Left 1998   rod insertion  . KNEE ARTHROSCOPY  03/2004; 06/2009   "right; left" Dr. Theda Sers  . KNEE SURGERY  2012   ARTHROSCOPIC LEFT KNEE  . LYMPH NODE BIOPSY  ~ 2009   "for sarcoidosis; don't know exactly which nodes" (  07/23/2102)  . MYOMECTOMY  1994   Open  . REFRACTIVE SURGERY  08/1998   "both eyes" (07/23/2012)  . REFRACTIVE SURGERY  2000   Bil  . TOTAL KNEE ARTHROPLASTY Left 09/24/2016   Procedure: TOTAL KNEE ARTHROPLASTY;  Surgeon: Dorna Leitz, MD;  Location: [facility];  Service: Orthopedics;  Laterality: Left;   Social History   Socioeconomic History  . Marital status: Widowed    Spouse name: Not on file  . Number of children: Not on file  . Years of education: Not on file  . Highest education level: Not on file  Occupational History  . Occupation: Designer, jewellery  Tobacco Use  . Smoking  status: Never Smoker  . Smokeless tobacco: Never Used  Vaping Use  . Vaping Use: Never used  Substance and Sexual Activity  . Alcohol use: Yes    Comment: Occas  . Drug use: No  . Sexual activity: Not Currently    Birth control/protection: Post-menopausal    Comment: 1st intercourse 8 yo-1 partner  Other Topics Concern  . Not on file  Social History Narrative   Lives with 4 cats   Social Determinants of Health   Financial Resource Strain: Not on file  Food Insecurity: Not on file  Transportation Needs: Not on file  Physical Activity: Not on file  Stress: Not on file  Social Connections: Not on file   Allergies  Allergen Reactions  . Fluoxetine Hcl Other (See Comments)    (PROZAC) Suicidal thoughts  . Sulfa Antibiotics Rash  . Chocolate Other (See Comments)    SOMETIMES CAUSES SEVERE HEADACHES  . Floxin [Ocuflox] Anxiety    shaky  . Other Nausea And Vomiting    INSTANT ICED TEA PACKETS  . Sulfonamide Derivatives Rash   Family History  Problem Relation Age of Onset  . Diabetes Mother   . Hypertension Mother   . Stroke Mother   . Hypertension Sister   . Asthma Sister   . Gout Sister   . Heart disease Father 44  . Hypertension Father   . Colon cancer Neg Hx   . Breast cancer Neg Hx      Current Outpatient Medications (Cardiovascular):  .  atenolol (TENORMIN) 50 MG tablet, TAKE 1 TABLET(50 MG) BY MOUTH DAILY .  diltiazem (DILT-XR) 240 MG 24 hr capsule, TAKE 1 CAPSULE(240 MG) BY MOUTH DAILY .  diltiazem (TIAZAC) 240 MG 24 hr capsule, TAKE 1 CAPSULE(240 MG) BY MOUTH DAILY .  furosemide (LASIX) 40 MG tablet, Take 1 tablet (40 mg total) by mouth 2 (two) times daily. (Patient taking differently: Take 40 mg by mouth daily.) .  hydrALAZINE (APRESOLINE) 100 MG tablet, Take 1 tablet (100 mg total) by mouth every 8 (eight) hours. (Patient taking differently: Take 100 mg by mouth 3 (three) times daily.)   Current Outpatient Medications (Analgesics):  .  aspirin EC 81 MG  tablet, Take 81 mg by mouth daily.  Current Outpatient Medications (Hematological):  .  ferrous sulfate 325 (65 FE) MG tablet, Take 325 mg by mouth daily with breakfast.  Current Outpatient Medications (Other):  .  amoxicillin (AMOXIL) 500 MG tablet, Prior to dental appointments .  amphetamine-dextroamphetamine (ADDERALL) 30 MG tablet, Take 30 mg by mouth 2 (two) times daily.  Marland Kitchen  antiseptic oral rinse (BIOTENE) LIQD, 15 mLs by Mouth Rinse route 2 (two) times daily as needed for dry mouth. .  baclofen (LIORESAL) 10 MG tablet, Take 10 mg by mouth 2 (two) times daily. Marland Kitchen  BOTOX  200 units SOLR,  .  brimonidine-timolol (COMBIGAN) 0.2-0.5 % ophthalmic solution,  .  carboxymethylcellulose (REFRESH PLUS) 0.5 % SOLN, with meals as needed. .  clorazepate (TRANXENE) 7.5 MG tablet, Take 7.5 mg by mouth 3 (three) times daily. Marland Kitchen  desvenlafaxine (PRISTIQ) 100 MG 24 hr tablet, Take 100 mg by mouth at bedtime. Marland Kitchen  latanoprost (XALATAN) 0.005 % ophthalmic solution, Place 1 drop into both eyes at bedtime. .  Melatonin 5 MG TABS, Take 10 mg by mouth at bedtime. .  Multiple Vitamins-Minerals (MULTIVITAMIN WITH MINERALS) tablet, Take 1 tablet by mouth daily. .  Omega 3 1200 MG CAPS, Take 2,400 mg by mouth at bedtime. Marland Kitchen  PREVIDENT 5000 DRY MOUTH 1.1 % GEL dental gel, See admin instructions. .  zaleplon (SONATA) 10 MG capsule, Take 10 mg by mouth at bedtime as needed for sleep.   Reviewed prior external information including notes and imaging from  primary care provider As well as notes that were available from care everywhere and other healthcare systems.  Past medical history, social, surgical and family history all reviewed in electronic medical record.  No pertanent information unless stated regarding to the chief complaint.   Review of Systems:  No headache, visual changes, nausea, vomiting, diarrhea, constipation, dizziness, abdominal pain, skin rash, fevers, chills, night sweats, weight loss, swollen  lymph nodes,, chest pain, shortness of breath, mood changes. POSITIVE muscle aches, body aches, joint swelling  Objective  Blood pressure 100/70, pulse (!) 45, height 5\' 6"  (1.676 m), weight 192 lb (87.1 kg), SpO2 99 %.   General: No apparent distress alert and oriented x3 mood and affect normal, dressed appropriately.  HEENT: Pupils equal, extraocular movements intact  Respiratory: Patient's speak in full sentences and does not appear short of breath  Cardiovascular: No lower extremity edema, non tender, no erythema  Gait antalgic MSK:   Knee: Right valgus deformity noted. Large thigh to calf ratio.  Tender to palpation over medial and PF joint line.  ROM limited lacking the last 10 degrees of flexion. instability with valgus force.  painful patellar compression. Patellar glide with moderate crepitus. Patellar and quadriceps tendons unremarkable. Hamstring and quadriceps strength is normal. Contralateral knee does have replacement noted.  Limited musculoskeletal ultrasound was performed and interpreted by Lyndal Pulley  Limited ultrasound of patient's right knee shows the patient does have severe nearly bone-on-bone osteoarthritic changes of the medial compartment.  Patient does have a large superficial cyst noted but does penetrate under the VMO.  No abnormal vascularity noted. Impression: Severe arthritic changes of the knee with a cyst noted underneath the VMO and going towards the medial joint space   Impression and Recommendations:     The above documentation has been reviewed and is accurate and complete Lyndal Pulley, DO

## 2020-10-20 ENCOUNTER — Encounter: Payer: Self-pay | Admitting: Family Medicine

## 2020-10-20 ENCOUNTER — Other Ambulatory Visit: Payer: Self-pay

## 2020-10-20 ENCOUNTER — Ambulatory Visit: Payer: Self-pay

## 2020-10-20 ENCOUNTER — Ambulatory Visit (INDEPENDENT_AMBULATORY_CARE_PROVIDER_SITE_OTHER)
Admission: RE | Admit: 2020-10-20 | Discharge: 2020-10-20 | Disposition: A | Discharge status: Home / Self Care | Payer: Commercial Managed Care - PPO | Source: Ambulatory Visit | Attending: Family Medicine | Admitting: Family Medicine

## 2020-10-20 ENCOUNTER — Ambulatory Visit (INDEPENDENT_AMBULATORY_CARE_PROVIDER_SITE_OTHER): Payer: Commercial Managed Care - PPO | Admitting: Family Medicine

## 2020-10-20 VITALS — BP 100/70 | HR 45 | Ht 66.0 in | Wt 192.0 lb

## 2020-10-20 DIAGNOSIS — G8929 Other chronic pain: Secondary | ICD-10-CM

## 2020-10-20 DIAGNOSIS — M170 Bilateral primary osteoarthritis of knee: Secondary | ICD-10-CM | POA: Diagnosis not present

## 2020-10-20 DIAGNOSIS — M25561 Pain in right knee: Secondary | ICD-10-CM | POA: Diagnosis not present

## 2020-10-20 NOTE — Patient Instructions (Addendum)
Good to see you Xray of right knee OA stability brace Compression sleeve Exercise 3 times a week Enjoy vacation See me again in 4-6 weeks

## 2020-10-20 NOTE — Assessment & Plan Note (Signed)
Patient has had replacement noted on the left side.  Patient has severe nearly bone-on-bone osteoarthritic changes on the medial aspect of the knee.  Patient does have a large cyst formation noted.  No significant abnormality with vascularity in the area.  Seems to be benign.  Questionable bursitis versus a very large parameniscal cyst that did have extension underneath the VMO.  Patient at this point would like to try compression, home exercises, will get repeat x-rays.  Due to the instability of patient's right knee and abnormal thigh to calf ratio patient would need a custom OA brace.  We will try to get that done for her to elongate the amount of time she can avoid surgery.  Follow-up with me again in 4 to 6 weeks and will consider aspiration of the cyst

## 2020-11-29 ENCOUNTER — Ambulatory Visit (INDEPENDENT_AMBULATORY_CARE_PROVIDER_SITE_OTHER): Payer: Commercial Managed Care - PPO | Admitting: Podiatry

## 2020-11-29 ENCOUNTER — Other Ambulatory Visit: Payer: Self-pay

## 2020-11-29 DIAGNOSIS — E1169 Type 2 diabetes mellitus with other specified complication: Secondary | ICD-10-CM | POA: Diagnosis not present

## 2020-11-29 DIAGNOSIS — E1151 Type 2 diabetes mellitus with diabetic peripheral angiopathy without gangrene: Secondary | ICD-10-CM | POA: Diagnosis not present

## 2020-11-29 DIAGNOSIS — B351 Tinea unguium: Secondary | ICD-10-CM

## 2020-11-29 DIAGNOSIS — L84 Corns and callosities: Secondary | ICD-10-CM

## 2020-11-29 NOTE — Progress Notes (Signed)
  Subjective:  Patient ID: Tara [name], female    DOB: [birth year],  MRN: [id number]  Chief Complaint  Patient presents with  . routine foot care    Routine foot care    69 y.o. female presents with the above complaint. History confirmed with patient   Objective:  Physical Exam: warm, good capillary refill, nail exam onychomycosis of the toenails, no trophic changes or ulcerative lesions, normal DP and non-palpable PT pulses, and normal sensory exam. Left Foot: HPK submet 5    Assessment:   1. Onychomycosis of multiple toenails with type 2 diabetes mellitus and peripheral angiopathy ([place])   2. Diabetes mellitus type 2 with peripheral artery disease ([facility])   3. Callus    Plan:  Patient was evaluated and treated and all questions answered.  Onychomycosis, Diabetes and PAD -Patient is diabetic with a qualifying condition for at risk foot care.   Procedure: Nail Debridement Type of Debridement: manual, sharp debridement. Instrumentation: Nail nipper, rotary burr. Number of Nails: 10   Procedure: Paring of Lesion Rationale: painful hyperkeratotic lesion Type of Debridement: manual, sharp debridement. Instrumentation: 312 blade Number of Lesions: 1  No follow-ups on file.

## 2020-12-01 NOTE — Progress Notes (Signed)
[facility] Phone: [phone] Subjective:   Fontaine No, am serving as a scribe for Dr. Hulan Saas. This visit occurred during the SARS-CoV-2 public health emergency.  Safety protocols were in place, including screening questions prior to the visit, additional usage of staff PPE, and extensive cleaning of exam room while observing appropriate contact time as indicated for disinfecting solutions.   I'm seeing this patient by the request  of:  Biagio Borg, MD  CC: Right knee pain follow-up  [id number]   10/20/2020 Patient has had replacement noted on the left side.  Patient has severe nearly bone-on-bone osteoarthritic changes on the medial aspect of the knee.  Patient does have a large cyst formation noted.  No significant abnormality with vascularity in the area.  Seems to be benign.  Questionable bursitis versus a very large parameniscal cyst that did have extension underneath the VMO.  Patient at this point would like to try compression, home exercises, will get repeat x-rays.  Due to the instability of patient's right knee and abnormal thigh to calf ratio patient would need a custom OA brace.  We will try to get that done for her to elongate the amount of time she can avoid surgery.  Follow-up with me again in 4 to 6 weeks and will consider aspiration of the cyst   Update 12/02/2020 Neville Pauls is a 70 y.o. female coming in with complaint of R knee pain.  Patient has known arthritic changes of the knee noted.  Patient had what appeared to be either a parameniscal cyst versus a large bursitis underneath the VMO area.  Patient states that she has had no change since last visit. Brace is supportive to help her ambulate somewhat better.    Patient did have new x-rays taken October 21, 2020 that were independently visualized by me showing a small joint effusion as well as severe medial joint space narrowing and moderate  to severe patellofemoral joint space narrowing.  Past Medical History:  Diagnosis Date  . Anemia   . ANKLE PAIN, LEFT 04/01/2008  . ANXIETY 04/17/2007  . Arthritis   . Chronic kidney disease    stage 3  . Colon polyp   . COLONIC POLYPS, HX OF 08/01/2007  . CONTUSIONS, MULTIPLE 04/01/2009  . DEPRESSION 04/17/2007  . DIZZINESS 08/01/2007  . DYSPNEA 08/01/2007   with exertion  . Enlargement of lymph nodes 08/13/2007  . Excessive involuntary blinking    per pt,going on since 2018  . GLUCOSE INTOLERANCE 08/01/2007  . 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 HYPERLIPIDEMIA 08/01/2007   no meds  . HYPERSOMNIA 07/28/2008  . HYPERTENSION 04/17/2007  . Impaired glucose tolerance 03/23/2011  . JOINT EFFUSION, LEFT KNEE 06/02/2010  . Loose body in knee 04/01/2009   pt not sure?  . Metabolic encephalopathy 47/42/5956-38/7564  . Migraines    "stopped 3-4 yr ago" (07/23/2012)  . Morbid obesity ([place]) 04/20/2007  . OTHER DISEASES OF LUNG NOT ELSEWHERE CLASSIFIED 08/01/2007  . Pain in joint, lower leg 04/01/2009  . PERIPHERAL EDEMA 04/21/2009  . Pre-diabetes   . Sarcoidosis 09/25/2007  . SHOULDER PAIN, LEFT 04/01/2009  . Sleep apnea    does not use Cpap   Past Surgical History:  Procedure Laterality Date  . BREAST SURGERY     Biopsy benign/bil breaST  . CATARACT EXTRACTION     RIGHT EYE  . COLONOSCOPY    . COMBINED MEDIASTINOSCOPY AND BRONCHOSCOPY  08/2007  . COMBINED MEDIASTINOSCOPY AND BRONCHOSCOPY  2009  . Dental implant    . FRACTURE SURGERY  ?02/1997   "left upper arm; put rod in" (07/23/2012)  . GUM SURGERY  2000-?2009   "several ORs; soft tissue graft; took material from roof of mouth" (07/23/2012  . HUMERUS SURGERY Left 1998   rod insertion  . KNEE ARTHROSCOPY  03/2004; 06/2009   "right; left" Dr. Theda Sers  . KNEE SURGERY  2012   ARTHROSCOPIC LEFT KNEE  . LYMPH NODE BIOPSY  ~ 2009   "for sarcoidosis; don't know exactly which nodes" (07/23/2102)  . MYOMECTOMY  1994   Open  . REFRACTIVE  SURGERY  08/1998   "both eyes" (07/23/2012)  . REFRACTIVE SURGERY  2000   Bil  . TOTAL KNEE ARTHROPLASTY Left 09/24/2016   Procedure: TOTAL KNEE ARTHROPLASTY;  Surgeon: Dorna Leitz, MD;  Location: [facility];  Service: Orthopedics;  Laterality: Left;   Social History   Socioeconomic History  . Marital status: Widowed    Spouse name: Not on file  . Number of children: Not on file  . Years of education: Not on file  . Highest education level: Not on file  Occupational History  . Occupation: Designer, jewellery  Tobacco Use  . Smoking status: Never Smoker  . Smokeless tobacco: Never Used  Vaping Use  . Vaping Use: Never used  Substance and Sexual Activity  . Alcohol use: Yes    Comment: Occas  . Drug use: No  . Sexual activity: Not Currently    Birth control/protection: Post-menopausal    Comment: 1st intercourse 18 yo-1 partner  Other Topics Concern  . Not on file  Social History Narrative   Lives with 4 cats   Social Determinants of Health   Financial Resource Strain: Not on file  Food Insecurity: Not on file  Transportation Needs: Not on file  Physical Activity: Not on file  Stress: Not on file  Social Connections: Not on file   Allergies  Allergen Reactions  . Fluoxetine Hcl Other (See Comments)    (PROZAC) Suicidal thoughts  . Sulfa Antibiotics Rash  . Chocolate Other (See Comments)    SOMETIMES CAUSES SEVERE HEADACHES  . Floxin [Ocuflox] Anxiety    shaky  . Other Nausea And Vomiting    INSTANT ICED TEA PACKETS  . Sulfonamide Derivatives Rash   Family History  Problem Relation Age of Onset  . Diabetes Mother   . Hypertension Mother   . Stroke Mother   . Hypertension Sister   . Asthma Sister   . Gout Sister   . Heart disease Father 48  . Hypertension Father   . Colon cancer Neg Hx   . Breast cancer Neg Hx      Current Outpatient Medications (Cardiovascular):  .  atenolol (TENORMIN) 50 MG tablet, TAKE 1 TABLET(50 MG) BY MOUTH DAILY .  diltiazem (DILT-XR)  240 MG 24 hr capsule, TAKE 1 CAPSULE(240 MG) BY MOUTH DAILY .  diltiazem (TIAZAC) 240 MG 24 hr capsule, TAKE 1 CAPSULE(240 MG) BY MOUTH DAILY .  furosemide (LASIX) 40 MG tablet, Take 1 tablet (40 mg total) by mouth 2 (two) times daily. (Patient taking differently: Take 40 mg by mouth daily.) .  hydrALAZINE (APRESOLINE) 100 MG tablet, Take 1 tablet (100 mg total) by mouth every 8 (eight) hours. (Patient taking differently: Take 100 mg by mouth 3 (three) times daily.)   Current Outpatient Medications (Analgesics):  .  aspirin EC 81 MG tablet, Take 81 mg  by mouth daily.  Current Outpatient Medications (Hematological):  .  ferrous sulfate 325 (65 FE) MG tablet, Take 325 mg by mouth daily with breakfast.  Current Outpatient Medications (Other):  .  amoxicillin (AMOXIL) 500 MG tablet, Prior to dental appointments .  amphetamine-dextroamphetamine (ADDERALL) 30 MG tablet, Take 30 mg by mouth 2 (two) times daily.  Marland Kitchen  antiseptic oral rinse (BIOTENE) LIQD, 15 mLs by Mouth Rinse route 2 (two) times daily as needed for dry mouth. .  baclofen (LIORESAL) 10 MG tablet, Take 10 mg by mouth 2 (two) times daily. Marland Kitchen  BOTOX 200 units SOLR,  .  brimonidine-timolol (COMBIGAN) 0.2-0.5 % ophthalmic solution,  .  carboxymethylcellulose (REFRESH PLUS) 0.5 % SOLN, with meals as needed. .  clorazepate (TRANXENE) 7.5 MG tablet, Take 7.5 mg by mouth 3 (three) times daily. Marland Kitchen  desvenlafaxine (PRISTIQ) 100 MG 24 hr tablet, Take 100 mg by mouth at bedtime. Marland Kitchen  latanoprost (XALATAN) 0.005 % ophthalmic solution, Place 1 drop into both eyes at bedtime. .  Melatonin 5 MG TABS, Take 10 mg by mouth at bedtime. .  Multiple Vitamins-Minerals (MULTIVITAMIN WITH MINERALS) tablet, Take 1 tablet by mouth daily. .  Omega 3 1200 MG CAPS, Take 2,400 mg by mouth at bedtime. Marland Kitchen  PREVIDENT 5000 DRY MOUTH 1.1 % GEL dental gel, See admin instructions. .  zaleplon (SONATA) 10 MG capsule, Take 10 mg by mouth at bedtime as needed for  sleep.   Reviewed prior external information including notes and imaging from  primary care provider As well as notes that were available from care everywhere and other healthcare systems.  Past medical history, social, surgical and family history all reviewed in electronic medical record.  No pertanent information unless stated regarding to the chief complaint.   Review of Systems:  No headache, visual changes, nausea, vomiting, diarrhea, constipation, dizziness, abdominal pain, skin rash, fevers, chills, night sweats, weight loss, swollen lymph nodes, body aches, chest pain, shortness of breath, mood changes. POSITIVE muscle aches, joint swelling  Objective  Blood pressure 110/72, pulse (!) 52, height 5\' 6"  (1.676 m), weight 192 lb (87.1 kg), SpO2 98 %.   General: No apparent distress alert and oriented x3 mood and affect normal, dressed appropriately.  HEENT: Pupils equal, extraocular movements intact  Respiratory: Patient's speak in full sentences and does not appear short of breath  Cardiovascular: trace lower extremity edema, non tender, no erythema  Gait antalgic MSK:   Knee: Right knee valgus deformity noted. Large thigh to calf ratio. Fullness of knee noted on the medial aspect.  Tender to palpation over medial and PF joint line.  ROM full in flexion and extension and lower leg rotation. instability with valgus force.  painful patellar compression. Patellar glide with moderate crepitus. Patellar and quadriceps tendons unremarkable. Hamstring and quadriceps strength is normal. Contralateral knee shows replacement noted   Limited musculoskeletal ultrasound was performed and interpreted by Lyndal Pulley  Limited ultrasound shows that patient does have fullness noted on the medial joint line. Difficult to tell what exactly it is.  Seems to be more of a parameniscal cyst with normal abnormal blood flow patient does still have what appears to be an effusion possibly noted or  large bursitis underneath the VMO but does not seem to cross midline.  Patient does have a severe narrowing of the patellofemoral joint. Impression: Questionable soft tissue mass versus parameniscal cyst and severe arthritic changes.   Impression and Recommendations:     The above documentation has  been reviewed and is accurate and complete Lyndal Pulley, DO

## 2020-12-02 ENCOUNTER — Encounter: Payer: Self-pay | Admitting: Family Medicine

## 2020-12-02 ENCOUNTER — Ambulatory Visit (INDEPENDENT_AMBULATORY_CARE_PROVIDER_SITE_OTHER): Payer: Commercial Managed Care - PPO | Admitting: Family Medicine

## 2020-12-02 ENCOUNTER — Ambulatory Visit: Payer: Self-pay

## 2020-12-02 ENCOUNTER — Other Ambulatory Visit: Payer: Self-pay

## 2020-12-02 VITALS — BP 110/72 | HR 52 | Ht 66.0 in | Wt 192.0 lb

## 2020-12-02 DIAGNOSIS — M25861 Other specified joint disorders, right knee: Secondary | ICD-10-CM | POA: Diagnosis not present

## 2020-12-02 DIAGNOSIS — M25561 Pain in right knee: Secondary | ICD-10-CM

## 2020-12-02 DIAGNOSIS — G8929 Other chronic pain: Secondary | ICD-10-CM | POA: Diagnosis not present

## 2020-12-02 NOTE — Patient Instructions (Signed)
MRI knee [place] I need to know what is happening in the knee before injection Will write you once I have MRI report

## 2020-12-02 NOTE — Assessment & Plan Note (Signed)
Patient has known severe arthritic changes of the right knee with some instability noted.  Patient though on ultrasound shows the patient has a very large possible parameniscal cyst and noted that measures nearly 3.5 cm in diameter.  Patient also has what appears to be hypoechoic changes in the patellofemoral or underneath the VMO but only medially.  Severe patellofemoral arthritis noted.  Do need to rule out the possibility of any type of mass.  Patient does have a history of sarcoidosis and hypercalcemia.  We will get MRI to further evaluate.  After MRI we will discuss the possibility of aspiration or surgical intervention.  Patient did have a replacement on the contralateral knee and would like to avoid a if possible.

## 2020-12-10 ENCOUNTER — Ambulatory Visit (INDEPENDENT_AMBULATORY_CARE_PROVIDER_SITE_OTHER): Payer: Commercial Managed Care - PPO

## 2020-12-10 ENCOUNTER — Other Ambulatory Visit: Payer: Self-pay

## 2020-12-10 DIAGNOSIS — M25561 Pain in right knee: Secondary | ICD-10-CM

## 2020-12-10 DIAGNOSIS — M25861 Other specified joint disorders, right knee: Secondary | ICD-10-CM

## 2020-12-10 DIAGNOSIS — G8929 Other chronic pain: Secondary | ICD-10-CM

## 2020-12-12 ENCOUNTER — Encounter: Payer: Self-pay | Admitting: Family Medicine

## 2020-12-13 ENCOUNTER — Other Ambulatory Visit: Payer: Self-pay

## 2020-12-13 DIAGNOSIS — M25561 Pain in right knee: Secondary | ICD-10-CM

## 2021-01-16 ENCOUNTER — Other Ambulatory Visit: Payer: Self-pay | Admitting: Internal Medicine

## 2021-01-16 NOTE — Telephone Encounter (Signed)
Please refill as per office routine med refill policy (all routine meds refilled for 3 mo or monthly per pt preference up to one year from last visit, then month to month grace period for 3 mo, then further med refills will have to be denied)  

## 2021-02-03 ENCOUNTER — Ambulatory Visit
Admission: EM | Admit: 2021-02-03 | Discharge: 2021-02-03 | Disposition: A | Discharge status: Home / Self Care | Payer: Commercial Managed Care - PPO | Attending: Emergency Medicine | Admitting: Emergency Medicine

## 2021-02-03 ENCOUNTER — Other Ambulatory Visit: Payer: Self-pay

## 2021-02-03 ENCOUNTER — Encounter: Payer: Self-pay | Admitting: Emergency Medicine

## 2021-02-03 DIAGNOSIS — R0989 Other specified symptoms and signs involving the circulatory and respiratory systems: Secondary | ICD-10-CM | POA: Diagnosis not present

## 2021-02-03 DIAGNOSIS — J069 Acute upper respiratory infection, unspecified: Secondary | ICD-10-CM

## 2021-02-03 MED ORDER — BENZONATATE 200 MG PO CAPS: 200.0000 mg | ORAL_CAPSULE | Freq: Three times a day (TID) | ORAL | 0 refills | Status: DC | PRN

## 2021-02-03 MED ORDER — FLUTICASONE PROPIONATE 50 MCG/ACT NA SUSP: 1.0000 | Freq: Every day | NASAL | 0 refills | Status: DC

## 2021-02-03 MED ORDER — DM-GUAIFENESIN ER 30-600 MG PO TB12: 1.0000 | ORAL_TABLET | Freq: Two times a day (BID) | ORAL | 0 refills | Status: DC
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
## 2021-02-03 NOTE — Discharge Instructions (Addendum)
COVID test pending, monitor MyChart for results Rest and fluids Tylenol/ibuprofen as needed for headache, body aches Flonase nasal spray 1 to 2 spray in each nostril daily May use over-the-counter Zyrtec or Claritin Mucinex DM for further relief of congestion Tessalon every 8 hours for cough Follow-up if not improving or worsening

## 2021-02-03 NOTE — ED Triage Notes (Signed)
Cough last 2 days, runny nose, and headaches.  No fevers. Has not tried any OTC medications.

## 2021-02-05 ENCOUNTER — Telehealth: Payer: Commercial Managed Care - PPO | Admitting: Family

## 2021-02-05 DIAGNOSIS — U071 COVID-19: Secondary | ICD-10-CM | POA: Diagnosis not present

## 2021-02-05 LAB — COVID-19, FLU A+B NAA
Influenza A, NAA: NOT DETECTED
Influenza B, NAA: NOT DETECTED
SARS-CoV-2, NAA: DETECTED — AB

## 2021-02-05 MED ORDER — ALBUTEROL SULFATE HFA 108 (90 BASE) MCG/ACT IN AERS: 2.0000 | INHALATION_SPRAY | Freq: Four times a day (QID) | RESPIRATORY_TRACT | 0 refills | Status: DC | PRN

## 2021-02-05 MED ORDER — MOLNUPIRAVIR EUA 200MG CAPSULE: 4.0000 | ORAL_CAPSULE | Freq: Two times a day (BID) | ORAL | 0 refills | Status: AC

## 2021-02-05 MED ORDER — BENZONATATE 100 MG PO CAPS: 100.0000 mg | ORAL_CAPSULE | Freq: Three times a day (TID) | ORAL | 0 refills | Status: DC | PRN
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
## 2021-03-03 ENCOUNTER — Other Ambulatory Visit: Payer: Self-pay

## 2021-03-03 ENCOUNTER — Ambulatory Visit (INDEPENDENT_AMBULATORY_CARE_PROVIDER_SITE_OTHER): Payer: Commercial Managed Care - PPO | Admitting: Podiatry

## 2021-03-03 DIAGNOSIS — B351 Tinea unguium: Secondary | ICD-10-CM

## 2021-03-03 DIAGNOSIS — E1169 Type 2 diabetes mellitus with other specified complication: Secondary | ICD-10-CM | POA: Diagnosis not present

## 2021-03-03 DIAGNOSIS — E1151 Type 2 diabetes mellitus with diabetic peripheral angiopathy without gangrene: Secondary | ICD-10-CM | POA: Diagnosis not present

## 2021-03-03 DIAGNOSIS — J301 Allergic rhinitis due to pollen: Secondary | ICD-10-CM | POA: Insufficient documentation

## 2021-03-03 DIAGNOSIS — R519 Headache, unspecified: Secondary | ICD-10-CM | POA: Insufficient documentation

## 2021-03-03 NOTE — Progress Notes (Signed)
  Subjective:  Patient ID: Tara [name], female    DOB: [birth year],  MRN: [id number]  Chief Complaint  Patient presents with   Nail Problem    Nail trim 1-5 bilateral   70 y.o. female presents with the above complaint. History confirmed with patient   Objective:  Physical Exam: warm, good capillary refill, nail exam onychomycosis of the toenails, no trophic changes or ulcerative lesions, normal DP and non-palpable PT pulses, and normal sensory exam. Left Foot: no HPK noted today   Assessment:   1. Onychomycosis of multiple toenails with type 2 diabetes mellitus and peripheral angiopathy ([place])    Plan:  Patient was evaluated and treated and all questions answered.  Onychomycosis, Diabetes and PAD -Patient is diabetic with a qualifying condition for at risk foot care.  Procedure: Nail Debridement Type of Debridement: manual, sharp debridement. Instrumentation: Nail nipper, rotary burr. Number of Nails: 10   Return in about 3 months (around 06/03/2021) for Diabetic Foot Care.

## 2021-03-17 ENCOUNTER — Other Ambulatory Visit: Payer: Self-pay | Admitting: Internal Medicine

## 2021-03-17 NOTE — Telephone Encounter (Signed)
Please refill as per office routine med refill policy (all routine meds to be refilled for 3 mo or monthly (per pt preference) up to one year from last visit, then month to month grace period for 3 mo, then further med refills will have to be denied) ? ?

## 2021-04-11 ENCOUNTER — Ambulatory Visit (INDEPENDENT_AMBULATORY_CARE_PROVIDER_SITE_OTHER): Payer: Commercial Managed Care - PPO | Admitting: Internal Medicine

## 2021-04-11 ENCOUNTER — Encounter: Payer: Self-pay | Admitting: Internal Medicine

## 2021-04-11 ENCOUNTER — Other Ambulatory Visit: Payer: Self-pay

## 2021-04-11 ENCOUNTER — Encounter: Payer: Self-pay | Admitting: Podiatry

## 2021-04-11 VITALS — BP 106/60 | HR 73 | Temp 98.2°F | Ht 66.0 in | Wt 192.0 lb

## 2021-04-11 DIAGNOSIS — E099 Drug or chemical induced diabetes mellitus without complications: Secondary | ICD-10-CM

## 2021-04-11 DIAGNOSIS — Z23 Encounter for immunization: Secondary | ICD-10-CM | POA: Diagnosis not present

## 2021-04-11 DIAGNOSIS — R059 Cough, unspecified: Secondary | ICD-10-CM

## 2021-04-11 DIAGNOSIS — E7800 Pure hypercholesterolemia, unspecified: Secondary | ICD-10-CM | POA: Diagnosis not present

## 2021-04-11 DIAGNOSIS — N184 Chronic kidney disease, stage 4 (severe): Secondary | ICD-10-CM | POA: Diagnosis not present

## 2021-04-11 DIAGNOSIS — T380X5D Adverse effect of glucocorticoids and synthetic analogues, subsequent encounter: Secondary | ICD-10-CM

## 2021-04-11 DIAGNOSIS — I10 Essential (primary) hypertension: Secondary | ICD-10-CM

## 2021-04-11 LAB — LIPID PANEL
Cholesterol: 118 mg/dL (ref 0–200)
HDL: 53.4 mg/dL (ref >39.00)
LDL Cholesterol: 51 mg/dL (ref 0–99)
NonHDL: 64.15
Total CHOL/HDL Ratio: 2
Triglycerides: 65 mg/dL (ref 0.0–149.0)
VLDL: 13 mg/dL (ref 0.0–40.0)

## 2021-04-11 LAB — HEPATIC FUNCTION PANEL
ALT: 23 U/L (ref 0–35)
AST: 23 U/L (ref 0–37)
Albumin: 4.1 g/dL (ref 3.5–5.2)
Alkaline Phosphatase: 104 U/L (ref 39–117)
Bilirubin, Direct: 0.2 mg/dL (ref 0.0–0.3)
Total Bilirubin: 0.6 mg/dL (ref 0.2–1.2)
Total Protein: 6.8 g/dL (ref 6.0–8.3)

## 2021-04-11 LAB — BASIC METABOLIC PANEL
BUN: 28 mg/dL — ABNORMAL HIGH (ref 6–23)
CO2: 31 mEq/L (ref 19–32)
Calcium: 9.4 mg/dL (ref 8.4–10.5)
Chloride: 100 mEq/L (ref 96–112)
Creatinine, Ser: 1.21 mg/dL — ABNORMAL HIGH (ref 0.40–1.20)
GFR: 46.1 mL/min — ABNORMAL LOW (ref >60.00)
Glucose, Bld: 112 mg/dL — ABNORMAL HIGH (ref 70–99)
Potassium: 3.6 mEq/L (ref 3.5–5.1)
Sodium: 139 mEq/L (ref 135–145)

## 2021-04-11 LAB — HEMOGLOBIN A1C: Hgb A1c MFr Bld: 5.8 % (ref 4.6–6.5)

## 2021-04-11 NOTE — Patient Instructions (Signed)
You had the flu shot today  Please continue all other medications as before, and refills have been done if requested.  Please have the pharmacy call with any other refills you may need.  Please continue your efforts at being more active, low cholesterol diet, and weight control.  You are otherwise up to date with prevention measures today.  Please keep your appointments with your specialists as you may have planned  .Please go to the LAB at the blood drawing area for the tests to be done  You will be contacted by phone if any changes need to be made immediately.  Otherwise, you will receive a letter about your results with an explanation, but please check with MyChart first.  Please remember to sign up for MyChart if you have not done so, as this will be important to you in the future with finding out test results, communicating by private email, and scheduling acute appointments online when needed.  Please make an Appointment to return in 6 months, or sooner if needed  

## 2021-04-11 NOTE — Progress Notes (Signed)
Patient ID: Tara [name], female   DOB: March 25, 1951, 70 y.o.   MRN: [id number]        Chief Complaint: follow up HTN, HLD and hyperglycemia, ckd, migraine       HPI:  Taren Dymek is a 70 y.o. female here overall doing well.  Pt denies chest pain, increased sob or doe, wheezing, orthopnea, PND, increased LE swelling, palpitations, dizziness or syncope.   Pt denies polydipsia, polyuria, or new focal neuro s/s.   Pt denies fever, wt loss, night sweats, loss of appetite, or other constitutional symptoms   Had covid infection July 2022, likely at a [employer] concert.  Due for flu shot.  Botox for migraine working well per [facility] provider.  No other new compalints Wt Readings from Last 3 Encounters:  04/11/21 192 lb (87.1 kg)  02/03/21 190 lb (86.2 kg)  12/02/20 192 lb (87.1 kg)   BP Readings from Last 3 Encounters:  04/11/21 106/60  02/03/21 102/62  12/02/20 110/72         Past Medical History:  Diagnosis Date   Anemia    ANKLE PAIN, LEFT 04/01/2008   ANXIETY 04/17/2007   Arthritis    Chronic kidney disease    stage 3   Colon polyp    COLONIC POLYPS, HX OF 08/01/2007   CONTUSIONS, MULTIPLE 04/01/2009   DEPRESSION 04/17/2007   DIZZINESS 08/01/2007   DYSPNEA 08/01/2007   with exertion   Enlargement of lymph nodes 08/13/2007   Excessive involuntary blinking    per pt,going on since 2018   GLUCOSE INTOLERANCE 08/01/2007   Hypercalcemia due to sarcoidosis 2014   HYPERLIPIDEMIA 08/01/2007   no meds   HYPERSOMNIA 07/28/2008   HYPERTENSION 04/17/2007   Impaired glucose tolerance 03/23/2011   JOINT EFFUSION, LEFT KNEE 06/02/2010   Loose body in knee 04/01/2009   pt not sure?   Metabolic encephalopathy 03/50/0938-18/2993   Migraines    "stopped 3-4 yr ago" (07/23/2012)   Morbid obesity ([place]) 04/20/2007   OTHER DISEASES OF LUNG NOT ELSEWHERE CLASSIFIED 08/01/2007   Pain in joint, lower leg 04/01/2009   PERIPHERAL EDEMA 04/21/2009   Pre-diabetes    Sarcoidosis 09/25/2007   SHOULDER PAIN, LEFT  04/01/2009   Sleep apnea    does not use Cpap   Past Surgical History:  Procedure Laterality Date   BREAST SURGERY     Biopsy benign/bil breaST   CATARACT EXTRACTION     RIGHT EYE   COLONOSCOPY     COMBINED MEDIASTINOSCOPY AND BRONCHOSCOPY  08/2007   COMBINED MEDIASTINOSCOPY AND BRONCHOSCOPY  2009   Dental implant     FRACTURE SURGERY  ?02/1997   "left upper arm; put rod in" (07/23/2012)   GUM SURGERY  2000-?2009   "several ORs; soft tissue graft; took material from roof of mouth" (07/23/2012   HUMERUS SURGERY Left 1998   rod insertion   KNEE ARTHROSCOPY  03/2004; 06/2009   "right; left" Dr. Theda Sers   KNEE SURGERY  2012   ARTHROSCOPIC LEFT KNEE   LYMPH NODE BIOPSY  ~ 2009   "for sarcoidosis; don't know exactly which nodes" (07/23/2102)   [place]  08/1998   "both eyes" (07/23/2012)   [place]   Bil   TOTAL KNEE ARTHROPLASTY Left 09/24/2016   Procedure: TOTAL KNEE ARTHROPLASTY;  Surgeon: Dorna Leitz, MD;  Location: [facility];  Service: Orthopedics;  Laterality: Left;    reports that she has never smoked. She has  never used smokeless tobacco. She reports current alcohol use. She reports that she does not use drugs. family history includes Asthma in her sister; Diabetes in her mother; Gout in her sister; Heart disease (age of onset: 15) in her father; Hypertension in her father, mother, and sister; Stroke in her mother. Allergies  Allergen Reactions   Fluoxetine Hcl Other (See Comments)    (PROZAC) Suicidal thoughts   Sulfa Antibiotics Rash   Chocolate Other (See Comments)    SOMETIMES CAUSES SEVERE HEADACHES   Floxin [Ocuflox] Anxiety    shaky   Other Nausea And Vomiting    INSTANT ICED TEA PACKETS   Sulfonamide Derivatives Rash   Current Outpatient Medications on File Prior to Visit  Medication Sig Dispense Refill   albuterol (VENTOLIN HFA) 108 (90 Base) MCG/ACT inhaler Inhale 2 puffs into the lungs every 6 (six) hours as needed  for wheezing or shortness of breath. 8 g 0   amoxicillin (AMOXIL) 500 MG tablet Prior to dental appointments     antiseptic oral rinse (BIOTENE) LIQD 15 mLs by Mouth Rinse route 2 (two) times daily as needed for dry mouth.     aspirin EC 81 MG tablet Take 81 mg by mouth daily.     atenolol (TENORMIN) 50 MG tablet Take 1 tablet (50 mg total) by mouth daily. 90 tablet 0   BOTOX 200 units SOLR      brimonidine-timolol (COMBIGAN) 0.2-0.5 % ophthalmic solution      carboxymethylcellulose (REFRESH PLUS) 0.5 % SOLN with meals as needed.     clorazepate (TRANXENE) 7.5 MG tablet Take 7.5 mg by mouth 3 (three) times daily.     desvenlafaxine (PRISTIQ) 100 MG 24 hr tablet Take 100 mg by mouth at bedtime.     diltiazem (DILACOR XR) 240 MG 24 hr capsule Take by mouth.     diltiazem (DILT-XR) 240 MG 24 hr capsule TAKE 1 CAPSULE(240 MG) BY MOUTH DAILY 90 capsule 3   diltiazem (TIAZAC) 240 MG 24 hr capsule TAKE 1 CAPSULE(240 MG) BY MOUTH DAILY     ferrous sulfate 325 (65 FE) MG tablet Take 325 mg by mouth daily with breakfast.     furosemide (LASIX) 40 MG tablet Take 1 tablet (40 mg total) by mouth 2 (two) times daily. (Patient taking differently: Take 40 mg by mouth daily.) 30 tablet 0   hydrALAZINE (APRESOLINE) 100 MG tablet Take 1 tablet (100 mg total) by mouth every 8 (eight) hours. (Patient taking differently: Take 100 mg by mouth 3 (three) times daily.)     latanoprost (XALATAN) 0.005 % ophthalmic solution Place 1 drop into both eyes at bedtime.     Melatonin 5 MG TABS Take 10 mg by mouth at bedtime.     Multiple Vitamins-Minerals (MULTIVITAMIN WITH MINERALS) tablet Take 1 tablet by mouth daily.     Omega 3 1200 MG CAPS Take 2,400 mg by mouth at bedtime.     PREVIDENT 5000 BOOSTER PLUS 1.1 % PSTE See admin instructions.     PREVIDENT 5000 DRY MOUTH 1.1 % GEL dental gel See admin instructions.     zaleplon (SONATA) 10 MG capsule Take 10 mg by mouth at bedtime as needed for sleep.     baclofen (LIORESAL)  10 MG tablet Take 10 mg by mouth 2 (two) times daily. (Patient not taking: Reported on 04/11/2021)     cetirizine (ZYRTEC) 10 MG tablet  (Patient not taking: Reported on 04/11/2021)     dextromethorphan-guaiFENesin ([place] DM) 30-600 MG  12hr tablet Take 1 tablet by mouth 2 (two) times daily. (Patient not taking: Reported on 04/11/2021) 15 tablet 0   Doxepin HCl 6 MG TABS Take 1 tablet by mouth at bedtime. (Patient not taking: Reported on 04/11/2021)     fluticasone (FLONASE) 50 MCG/ACT nasal spray Place 1-2 sprays into both nostrils daily. (Patient not taking: Reported on 04/11/2021) 16 g 0   timolol (TIMOPTIC) 0.5 % ophthalmic solution USE  1 DROP IN BOTH EYES BID UTD (Patient not taking: Reported on 04/11/2021)     triamcinolone (NASACORT) 55 MCG/ACT AERO nasal inhaler 1 spray in each nostril (Patient not taking: Reported on 04/11/2021)     zonisamide (ZONEGRAN) 100 MG capsule Take 200 mg by mouth daily. (Patient not taking: Reported on 04/11/2021)     No current facility-administered medications on file prior to visit.        ROS:  All others reviewed and negative.  Objective        PE:  BP 106/60 (BP Location: Right Arm, Patient Position: Sitting, Cuff Size: Large)   Pulse 73   Temp 98.2 F (36.8 C) (Oral)   Ht 5\' 6"  (1.676 m)   Wt 192 lb (87.1 kg)   SpO2 99%   BMI 30.99 kg/m                 Constitutional: Pt appears in NAD               HENT: Head: NCAT.                Right Ear: External ear normal.                 Left Ear: External ear normal.                Eyes: . Pupils are equal, round, and reactive to light. Conjunctivae and EOM are normal               Nose: without d/c or deformity               Neck: Neck supple. Gross normal ROM               Cardiovascular: Normal rate and regular rhythm.                 Pulmonary/Chest: Effort normal and breath sounds without rales or wheezing.                Abd:  Soft, NT, ND, + BS, no organomegaly               Neurological: Pt is  alert. At baseline orientation, motor grossly intact               Skin: Skin is warm. No rashes, no other new lesions, LE edema - none               Psychiatric: Pt behavior is normal without agitation   Micro: none  Cardiac tracings I have personally interpreted today:  none  Pertinent Radiological findings (summarize): none   Lab Results  Component Value Date   WBC 8.3 10/06/2020   HGB 14.4 10/06/2020   HCT 43.7 10/06/2020   PLT 239.0 10/06/2020   GLUCOSE 112 (H) 04/11/2021   CHOL 118 04/11/2021   TRIG 65.0 04/11/2021   HDL 53.40 04/11/2021   LDLDIRECT 50.0 09/24/2017   LDLCALC 51 04/11/2021   ALT 23 04/11/2021   AST 23 04/11/2021  NA 139 04/11/2021   K 3.6 04/11/2021   CL 100 04/11/2021   CREATININE 1.21 (H) 04/11/2021   BUN 28 (H) 04/11/2021   CO2 31 04/11/2021   TSH 1.83 10/06/2020   INR 0.98 09/17/2016   HGBA1C 5.8 04/11/2021   MICROALBUR 1.0 10/06/2020   Assessment/Plan:  Samaa Ueda is a 70 y.o. White or Caucasian [1] female with  has a past medical history of Anemia, ANKLE PAIN, LEFT (04/01/2008), ANXIETY (04/17/2007), Arthritis, Chronic kidney disease, Colon polyp, COLONIC POLYPS, HX OF (08/01/2007), CONTUSIONS, MULTIPLE (04/01/2009), DEPRESSION (04/17/2007), DIZZINESS (08/01/2007), DYSPNEA (08/01/2007), Enlargement of lymph nodes (08/13/2007), Excessive involuntary blinking, GLUCOSE INTOLERANCE (08/01/2007), Hypercalcemia due to sarcoidosis (2014), HYPERLIPIDEMIA (08/01/2007), HYPERSOMNIA (07/28/2008), HYPERTENSION (04/17/2007), Impaired glucose tolerance (03/23/2011), JOINT EFFUSION, LEFT KNEE (06/02/2010), Loose body in knee (78/67/5449), Metabolic encephalopathy (20/04/711-19/7588), Migraines, Morbid obesity ([place]) (04/20/2007), OTHER DISEASES OF LUNG NOT ELSEWHERE CLASSIFIED (08/01/2007), Pain in joint, lower leg (04/01/2009), PERIPHERAL EDEMA (04/21/2009), Pre-diabetes, Sarcoidosis (09/25/2007), SHOULDER PAIN, LEFT (04/01/2009), and Sleep apnea.  CKD (chronic kidney disease)  stage 4, GFR 15-29 ml/min ([facility]) Lab Results  Component Value Date   CREATININE 1.21 (H) 04/11/2021   Stable overall, cont to avoid nephrotoxins  Hyperlipidemia Lab Results  Component Value Date   LDLCALC 51 04/11/2021   Stable, pt to continue current lower chol diet   Essential hypertension BP Readings from Last 3 Encounters:  04/11/21 106/60  02/03/21 102/62  12/02/20 110/72   Stable, pt to continue medical treatment tenormin, diltiazem, hydralazine   Steroid-induced diabetes Lab Results  Component Value Date   HGBA1C 5.8 04/11/2021   Stable, pt to continue current medical treatment  - diet  Followup: Return in about 6 months (around 10/09/2021).  Cathlean Cower, MD 04/14/2021 9:53 PM [facility]

## 2021-04-14 ENCOUNTER — Encounter: Payer: Self-pay | Admitting: Internal Medicine

## 2021-04-14 NOTE — Assessment & Plan Note (Signed)
Lab Results  Component Value Date   LDLCALC 51 04/11/2021   Stable, pt to continue current lower chol diet

## 2021-04-14 NOTE — Assessment & Plan Note (Signed)
Lab Results  Component Value Date   HGBA1C 5.8 04/11/2021   Stable, pt to continue current medical treatment  - diet

## 2021-04-14 NOTE — Assessment & Plan Note (Signed)
BP Readings from Last 3 Encounters:  04/11/21 106/60  02/03/21 102/62  12/02/20 110/72   Stable, pt to continue medical treatment tenormin, diltiazem, hydralazine

## 2021-04-14 NOTE — Assessment & Plan Note (Signed)
Lab Results  Component Value Date   CREATININE 1.21 (H) 04/11/2021   Stable overall, cont to avoid nephrotoxins

## 2021-04-16 ENCOUNTER — Other Ambulatory Visit: Payer: Self-pay | Admitting: Internal Medicine

## 2021-04-16 NOTE — Telephone Encounter (Signed)
Please refill as per office routine med refill policy (all routine meds to be refilled for 3 mo or monthly (per pt preference) up to one year from last visit, then month to month grace period for 3 mo, then further med refills will have to be denied) ? ?

## 2021-05-22 ENCOUNTER — Ambulatory Visit: Payer: Commercial Managed Care - PPO | Admitting: Obstetrics and Gynecology

## 2021-05-22 ENCOUNTER — Other Ambulatory Visit: Payer: Self-pay

## 2021-05-22 ENCOUNTER — Encounter: Payer: Self-pay | Admitting: Nurse Practitioner

## 2021-05-22 ENCOUNTER — Ambulatory Visit (INDEPENDENT_AMBULATORY_CARE_PROVIDER_SITE_OTHER): Payer: Commercial Managed Care - PPO | Admitting: Nurse Practitioner

## 2021-05-22 VITALS — BP 124/76 | Ht 66.0 in | Wt 190.0 lb

## 2021-05-22 DIAGNOSIS — Z01419 Encounter for gynecological examination (general) (routine) without abnormal findings: Secondary | ICD-10-CM

## 2021-05-22 DIAGNOSIS — Z780 Asymptomatic menopausal state: Secondary | ICD-10-CM | POA: Diagnosis not present

## 2021-05-22 NOTE — Progress Notes (Signed)
   Tara [name] 1950/11/14 [id number]   History:  70 y.o. G0 presents for annual exam without GYN complaints. Postmenopausal - no HRT, no bleeding. Normal pap and mammogram history. Unremarkable labial ultrasound 05/2020 for right labial enlargement/asymmetry, asymptomatic. History of sarcoidosis, anemia.   Gynecologic History No LMP recorded. Patient is postmenopausal.   Contraception: post menopausal status Sexually active: No  Health Maintenance Last Pap: 05/18/2020. Results were: Normal, 3-year repeat Last mammogram: 05/06/2019. Results were: Normal Last colonoscopy: 09/08/2018. Results were: tubular adenoma, 3-year recall Last Dexa: 06/10/2018. Results were: Normal  Past medical history, past surgical history, family history and social history were all reviewed and documented in the EPIC chart. 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   ROS:  A ROS was performed and pertinent positives and negatives are included.  Exam:  Vitals:   05/22/21 1609  BP: 124/76  Weight: 190 lb (86.2 kg)  Height: 5\' 6"  (1.676 m)   Body mass index is 30.67 kg/m.  General appearance:  Normal Thyroid:  Symmetrical, normal in size, without palpable masses or nodularity. Respiratory  Auscultation:  Clear without wheezing or rhonchi Cardiovascular  Auscultation:  Regular rate, without rubs, murmurs or gallops  Edema/varicosities:  Not grossly evident Abdominal  Soft,nontender, without masses, guarding or rebound.  Liver/spleen:  No organomegaly noted  Hernia:  None appreciated  Skin  Inspection:  Grossly normal Breasts: Examined lying and sitting.   Right: Without masses, retractions, nipple discharge or axillary adenopathy.   Left: Without masses, retractions, nipple discharge or axillary adenopathy. Genitourinary   Inguinal/mons:  Normal without inguinal adenopathy  External genitalia:  Right labia majora is symmetrically and significantly larger than left, no tenderness,  soft  BUS/Urethra/Skene's glands:  Normal  Vagina:  Normal appearing with normal color and discharge, no lesions  Cervix:  Normal appearing without discharge or lesions  Uterus:  Difficult to palpate due to body habitus but no gross masses or tenderness  Adnexa/parametria:     Rt: Normal in size, without masses or tenderness.   Lt: Normal in size, without masses or tenderness.  Anus and perineum: Normal  Digital rectal exam: Normal sphincter tone without palpated masses or tenderness  Patient informed chaperone available to be present for breast and pelvic exam. Patient has requested no chaperone to be present. Patient has been advised what will be completed during breast and pelvic exam.   Assessment/Plan:  70 y.o. G0 for annual exam.   Well female exam with routine gynecological exam - Education provided on SBEs, importance of preventative screenings, current guidelines, high calcium diet, regular exercise, and multivitamin daily. Labs with PCP.  Postmenopausal - no HRT, no bleeding.  Screening for cervical cancer - Normal Pap history.  If she prefers to continue screenings we will repeat at 3-year interval.  Screening for breast cancer - Normal mammogram history.  Overdue and encouraged to schedule soon.  Normal breast exam today.  Screening for colon cancer - 2020 colonoscopy - tubular adenomas. Will repeat in 3 years at GI's recommended interval.   Screening for osteoporosis - Normal bone density in 2019. Will repeat at 5-year interval per recommendation.  Return in 1 year for annual exam.  Tamela Gammon DNP, 4:24 PM 05/22/2021

## 2021-06-06 ENCOUNTER — Other Ambulatory Visit: Payer: Self-pay

## 2021-06-06 ENCOUNTER — Ambulatory Visit (INDEPENDENT_AMBULATORY_CARE_PROVIDER_SITE_OTHER): Payer: Commercial Managed Care - PPO | Admitting: Podiatry

## 2021-06-06 DIAGNOSIS — B351 Tinea unguium: Secondary | ICD-10-CM | POA: Diagnosis not present

## 2021-06-06 DIAGNOSIS — E1169 Type 2 diabetes mellitus with other specified complication: Secondary | ICD-10-CM

## 2021-06-06 DIAGNOSIS — E1151 Type 2 diabetes mellitus with diabetic peripheral angiopathy without gangrene: Secondary | ICD-10-CM | POA: Diagnosis not present

## 2021-06-06 NOTE — Progress Notes (Signed)
  Subjective:  Patient ID: Tara [name], female    DOB: Jul 29, 1950,  MRN: [id number]  No chief complaint on file.  70 y.o. female presents with the above complaint. History confirmed with patient. Requests care of her nails today.  Objective:  Physical Exam: warm, good capillary refill, nail exam onychomycosis of the toenails, no trophic changes or ulcerative lesions, normal DP and non-palpable PT pulses, and normal sensory exam. Left Foot: no HPK noted today   Assessment:   1. Onychomycosis of multiple toenails with type 2 diabetes mellitus and peripheral angiopathy ([place])    Plan:  Patient was evaluated and treated and all questions answered.  Onychomycosis, Diabetes and PAD -Patient is diabetic with a qualifying condition for at risk foot care.  Procedure: Nail Debridement Type of Debridement: manual, sharp debridement. Instrumentation: Nail nipper, rotary burr. Number of Nails: 10     Return in about 3 months (around 09/06/2021) for Diabetic Foot Care.

## 2021-07-19 DIAGNOSIS — H401113 Primary open-angle glaucoma, right eye, severe stage: Secondary | ICD-10-CM | POA: Diagnosis not present

## 2021-07-19 DIAGNOSIS — H401122 Primary open-angle glaucoma, left eye, moderate stage: Secondary | ICD-10-CM | POA: Diagnosis not present

## 2021-07-22 ENCOUNTER — Telehealth: Payer: Commercial Managed Care - PPO | Admitting: Nurse Practitioner

## 2021-07-22 DIAGNOSIS — J069 Acute upper respiratory infection, unspecified: Secondary | ICD-10-CM

## 2021-07-22 MED ORDER — BINAXNOW COVID-19 AG HOME TEST VI KIT: PACK | 0 refills | Status: DC
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
## 2021-08-01 DIAGNOSIS — G43719 Chronic migraine without aura, intractable, without status migrainosus: Secondary | ICD-10-CM | POA: Diagnosis not present

## 2021-08-22 DIAGNOSIS — F3342 Major depressive disorder, recurrent, in full remission: Secondary | ICD-10-CM | POA: Diagnosis not present

## 2021-08-22 DIAGNOSIS — F5101 Primary insomnia: Secondary | ICD-10-CM | POA: Diagnosis not present

## 2021-08-22 DIAGNOSIS — F900 Attention-deficit hyperactivity disorder, predominantly inattentive type: Secondary | ICD-10-CM | POA: Diagnosis not present

## 2021-08-22 DIAGNOSIS — F411 Generalized anxiety disorder: Secondary | ICD-10-CM | POA: Diagnosis not present

## 2021-09-07 ENCOUNTER — Ambulatory Visit: Payer: Commercial Managed Care - PPO | Admitting: Internal Medicine

## 2021-09-08 ENCOUNTER — Ambulatory Visit: Payer: BC Managed Care – PPO | Admitting: Podiatry

## 2021-09-18 ENCOUNTER — Other Ambulatory Visit: Payer: Self-pay

## 2021-09-18 ENCOUNTER — Encounter: Payer: Self-pay | Admitting: Podiatry

## 2021-09-18 ENCOUNTER — Ambulatory Visit (INDEPENDENT_AMBULATORY_CARE_PROVIDER_SITE_OTHER): Payer: BC Managed Care – PPO | Admitting: Podiatry

## 2021-09-18 DIAGNOSIS — B351 Tinea unguium: Secondary | ICD-10-CM

## 2021-09-18 DIAGNOSIS — N179 Acute kidney failure, unspecified: Secondary | ICD-10-CM

## 2021-09-18 DIAGNOSIS — E1169 Type 2 diabetes mellitus with other specified complication: Secondary | ICD-10-CM | POA: Diagnosis not present

## 2021-09-18 DIAGNOSIS — N189 Chronic kidney disease, unspecified: Secondary | ICD-10-CM

## 2021-09-18 DIAGNOSIS — E1151 Type 2 diabetes mellitus with diabetic peripheral angiopathy without gangrene: Secondary | ICD-10-CM | POA: Diagnosis not present

## 2021-09-18 NOTE — Progress Notes (Signed)
This patient returns to my office for at risk foot care.  This patient requires this care by a professional since this patient will be at risk due to having diabetes and kidney disease. This patient is unable to cut nails herself since the patient cannot reach her nails.These nails are painful walking and wearing shoes.  This patient presents for at risk foot care today.  General Appearance  Alert, conversant and in no acute stress.  Vascular  Dorsalis pedis and pulses are palpable  bilaterally.  Posterior tibial pulses are weakly palpable. Capillary return is within normal limits  bilaterally. Temperature is within normal limits  bilaterally.  Neurologic  Senn-Weinstein monofilament wire test within normal limits  bilaterally. Muscle power within normal limits bilaterally.  Nails Thick disfigured discolored nails with subungual debris  from hallux to fifth toes bilaterally. No evidence of bacterial infection or drainage bilaterally.  Orthopedic  No limitations of motion  feet .  No crepitus or effusions noted.  Mild  hallux Varus  B/L.  Hammer toes 2-4  B/L.  Skin  normotropic skin with no porokeratosis noted bilaterally.  No signs of infections or ulcers noted.   Asymptomatic callus sub 5th met left foot.  Onychomycosis  Pain in right toes  Pain in left toes  Consent was obtained for treatment procedures.   Mechanical debridement of nails 1-5  bilaterally performed with a nail nipper.  Filed with dremel without incident.    Return office visit    4 months                  Told patient to return for periodic foot care and evaluation due to potential at risk complications.   Jaceon Heiberger DPM   

## 2021-10-10 ENCOUNTER — Ambulatory Visit (INDEPENDENT_AMBULATORY_CARE_PROVIDER_SITE_OTHER): Payer: BC Managed Care – PPO | Admitting: Internal Medicine

## 2021-10-10 ENCOUNTER — Encounter: Payer: Self-pay | Admitting: Internal Medicine

## 2021-10-10 ENCOUNTER — Encounter: Payer: Self-pay | Admitting: Gastroenterology

## 2021-10-10 VITALS — BP 122/76 | HR 68 | Temp 97.9°F | Ht 68.0 in | Wt 192.0 lb

## 2021-10-10 DIAGNOSIS — E538 Deficiency of other specified B group vitamins: Secondary | ICD-10-CM | POA: Diagnosis not present

## 2021-10-10 DIAGNOSIS — N184 Chronic kidney disease, stage 4 (severe): Secondary | ICD-10-CM

## 2021-10-10 DIAGNOSIS — I10 Essential (primary) hypertension: Secondary | ICD-10-CM

## 2021-10-10 DIAGNOSIS — T380X5D Adverse effect of glucocorticoids and synthetic analogues, subsequent encounter: Secondary | ICD-10-CM | POA: Diagnosis not present

## 2021-10-10 DIAGNOSIS — E099 Drug or chemical induced diabetes mellitus without complications: Secondary | ICD-10-CM | POA: Diagnosis not present

## 2021-10-10 DIAGNOSIS — Z23 Encounter for immunization: Secondary | ICD-10-CM

## 2021-10-10 DIAGNOSIS — Z1231 Encounter for screening mammogram for malignant neoplasm of breast: Secondary | ICD-10-CM | POA: Diagnosis not present

## 2021-10-10 DIAGNOSIS — E559 Vitamin D deficiency, unspecified: Secondary | ICD-10-CM

## 2021-10-10 DIAGNOSIS — Z0001 Encounter for general adult medical examination with abnormal findings: Secondary | ICD-10-CM | POA: Diagnosis not present

## 2021-10-10 DIAGNOSIS — Z86010 Personal history of colonic polyps: Secondary | ICD-10-CM

## 2021-10-10 LAB — URINALYSIS, ROUTINE W REFLEX MICROSCOPIC
Bilirubin Urine: NEGATIVE
Hgb urine dipstick: NEGATIVE
Ketones, ur: NEGATIVE
Leukocytes,Ua: NEGATIVE
Nitrite: NEGATIVE
RBC / HPF: NONE SEEN (ref 0–?)
Specific Gravity, Urine: <=1.005 — AB (ref 1.000–1.030)
Total Protein, Urine: NEGATIVE
Urine Glucose: NEGATIVE
Urobilinogen, UA: 0.2 (ref 0.0–1.0)
pH: 6.5 (ref 5.0–8.0)

## 2021-10-10 LAB — HEPATIC FUNCTION PANEL
ALT: 38 U/L — ABNORMAL HIGH (ref 0–35)
AST: 31 U/L (ref 0–37)
Albumin: 4.4 g/dL (ref 3.5–5.2)
Alkaline Phosphatase: 151 U/L — ABNORMAL HIGH (ref 39–117)
Bilirubin, Direct: 0.1 mg/dL (ref 0.0–0.3)
Total Bilirubin: 0.4 mg/dL (ref 0.2–1.2)
Total Protein: 7.2 g/dL (ref 6.0–8.3)

## 2021-10-10 LAB — CBC WITH DIFFERENTIAL/PLATELET
Basophils Absolute: 0.1 10*3/uL (ref 0.0–0.1)
Basophils Relative: 1.3 % (ref 0.0–3.0)
Eosinophils Absolute: 0.4 10*3/uL (ref 0.0–0.7)
Eosinophils Relative: 7.4 % — ABNORMAL HIGH (ref 0.0–5.0)
HCT: 42.6 % (ref 36.0–46.0)
Hemoglobin: 14.1 g/dL (ref 12.0–15.0)
Lymphocytes Relative: 22.5 % (ref 12.0–46.0)
Lymphs Abs: 1.3 10*3/uL (ref 0.7–4.0)
MCHC: 33.1 g/dL (ref 30.0–36.0)
MCV: 84.2 fl (ref 78.0–100.0)
Monocytes Absolute: 0.7 10*3/uL (ref 0.1–1.0)
Monocytes Relative: 11.9 % (ref 3.0–12.0)
Neutro Abs: 3.2 10*3/uL (ref 1.4–7.7)
Neutrophils Relative %: 56.9 % (ref 43.0–77.0)
Platelets: 187 10*3/uL (ref 150.0–400.0)
RBC: 5.06 Mil/uL (ref 3.87–5.11)
RDW: 15.3 % (ref 11.5–15.5)
WBC: 5.6 10*3/uL (ref 4.0–10.5)

## 2021-10-10 LAB — BASIC METABOLIC PANEL
BUN: 30 mg/dL — ABNORMAL HIGH (ref 6–23)
CO2: 32 mEq/L (ref 19–32)
Calcium: 9.7 mg/dL (ref 8.4–10.5)
Chloride: 102 mEq/L (ref 96–112)
Creatinine, Ser: 1.15 mg/dL (ref 0.40–1.20)
GFR: 48.83 mL/min — ABNORMAL LOW (ref >60.00)
Glucose, Bld: 79 mg/dL (ref 70–99)
Potassium: 3.7 mEq/L (ref 3.5–5.1)
Sodium: 140 mEq/L (ref 135–145)

## 2021-10-10 LAB — TSH: TSH: 2.68 u[IU]/mL (ref 0.35–5.50)

## 2021-10-10 LAB — LIPID PANEL
Cholesterol: 110 mg/dL (ref 0–200)
HDL: 56.2 mg/dL (ref >39.00)
LDL Cholesterol: 43 mg/dL (ref 0–99)
NonHDL: 54.07
Total CHOL/HDL Ratio: 2
Triglycerides: 56 mg/dL (ref 0.0–149.0)
VLDL: 11.2 mg/dL (ref 0.0–40.0)

## 2021-10-10 LAB — VITAMIN D 25 HYDROXY (VIT D DEFICIENCY, FRACTURES): VITD: 50.52 ng/mL (ref 30.00–100.00)

## 2021-10-10 LAB — HEMOGLOBIN A1C: Hgb A1c MFr Bld: 6.1 % (ref 4.6–6.5)

## 2021-10-10 LAB — VITAMIN B12: Vitamin B-12: 430 pg/mL (ref 211–911)

## 2021-10-10 NOTE — Assessment & Plan Note (Signed)
Age and sex appropriate education and counseling updated with regular exercise and diet ?Referrals for preventative services - for colonoscopy, mammogram ?Immunizations addressed - for pneumovax ?Smoking counseling  - none needed ?Evidence for depression or other mood disorder - none significant ?Most recent labs reviewed. ?I have personally reviewed and have noted: ?1) the patient's medical and social history ?2) The patient's current medications and supplements ?3) The patient's height, weight, and BMI have been recorded in the chart ? ?

## 2021-10-10 NOTE — Patient Instructions (Addendum)
You will be contacted regarding the referral for: mammogram and colonoscopy ? ?Please check on the insurance for your Shingrix shingles shot, and go to the pharmacy to have done if covered ? ?You  had the Pneumovax pneumonia shot today ? ?Your left ear was cleared of wax today ? ?Please continue all other medications as before, and refills have been done if requested. ? ?Please have the pharmacy call with any other refills you may need. ? ?Please continue your efforts at being more active, low cholesterol diet, and weight control. ? ?You are otherwise up to date with prevention measures today. ? ?Please keep your appointments with your specialists as you may have planned - renal Dr Posey Pronto and Neurology soon ? ?Please go to the LAB at the blood drawing area for the tests to be done ? ?You will be contacted by phone if any changes need to be made immediately.  Otherwise, you will receive a letter about your results with an explanation, but please check with MyChart first. ? ?Please remember to sign up for MyChart if you have not done so, as this will be important to you in the future with finding out test results, communicating by private email, and scheduling acute appointments online when needed. ? ?Please make an Appointment to return in 6 months, or sooner if needed, also with Lab Appointment for testing done 3-5 days before at the [facility] (so this is for TWO appointments - please see the scheduling desk as you leave) ? ?Due to the ongoing Covid 19 pandemic, our lab now requires an appointment for any labs done at our office.  If you need labs done and do not have an appointment, please call our office ahead of time to schedule before presenting to the lab for your testing. ? ? ?

## 2021-10-10 NOTE — Assessment & Plan Note (Signed)
Lab Results  ?Component Value Date  ? HGBA1C 6.1 10/10/2021  ? ?Stable, pt to continue current medical treatment  - diet ? ?

## 2021-10-10 NOTE — Assessment & Plan Note (Addendum)
Lab Results  ?Component Value Date  ? CREATININE 1.15 10/10/2021  ? ?Stable overall, cont to avoid nephrotoxins, see Dr Posey Pronto every 6 mo ? ?

## 2021-10-10 NOTE — Progress Notes (Signed)
Patient ID: Tara [name], female   DOB: February 22, 1951, 71 y.o.   MRN: [id number] ? ? ? ?     Chief Complaint:: wellness exam and ckd, dm ? ?     HPI:  Tara [name] is a 71 y.o. female here for wellness exam; due for pneumovax and colonoscopy, mammogram o/w up to date ?         ?              Also sees renal every 6 - 12 mo Dr patel.  Seeing neurology again in April for chronic HA, has tried botox, pain point injections and other med trials but not helped yet. Pt denies chest pain, increased sob or doe, wheezing, orthopnea, PND, increased LE swelling, palpitations, dizziness or syncope.   Pt denies polydipsia, polyuria, or new focal neuro s/s.    Pt denies fever, wt loss, night sweats, loss of appetite, or other constitutional symptoms   ?  ?Wt Readings from Last 3 Encounters:  ?10/10/21 192 lb (87.1 kg)  ?05/22/21 190 lb (86.2 kg)  ?04/11/21 192 lb (87.1 kg)  ? ?BP Readings from Last 3 Encounters:  ?10/10/21 122/76  ?05/22/21 124/76  ?04/11/21 106/60  ? ?Immunization History  ?Administered Date(s) Administered  ? Fluad Quad(high Dose 65+) 03/11/2019, 05/27/2020, 04/11/2021  ? Influenza Split 03/29/2013  ? Influenza Whole 05/09/1999, 05/19/2007, 04/01/2008, 04/21/2009, 03/26/2011  ? Influenza, High Dose Seasonal PF 03/27/2018, 05/25/2019  ? Influenza,inj,Quad PF,6+ Mos 03/25/2014, 03/27/2016, 06/13/2017  ? Influenza-Unspecified 04/15/2012, 04/14/2015  ? Moderna Sars-Covid-2 Vaccination 08/28/2019, 09/25/2019, 11/30/2019, 05/27/2020, 11/25/2020  ? Pneumococcal Conjugate-13 03/18/2020  ? Pneumococcal Polysaccharide-23 06/05/2013, 10/10/2021  ? Td 07/16/1992, 10/21/2008  ? Tdap 03/18/2020  ? Zoster, Live 12/14/2012  ? ?Health Maintenance Due  ?Topic Date Due  ? MAMMOGRAM  05/05/2021  ? COLONOSCOPY (Pts 45-19yr Insurance coverage will need to be confirmed)  09/08/2021  ? ?  ? ?Past Medical History:  ?Diagnosis Date  ? Anemia   ? ANKLE PAIN, LEFT 04/01/2008  ? ANXIETY 04/17/2007  ? Arthritis   ? Chronic kidney disease   ?  stage 3  ? Colon polyp   ? COLONIC POLYPS, HX OF 08/01/2007  ? CONTUSIONS, MULTIPLE 04/01/2009  ? DEPRESSION 04/17/2007  ? DIZZINESS 08/01/2007  ? DYSPNEA 08/01/2007  ? with exertion  ? Enlargement of lymph nodes 08/13/2007  ? Excessive involuntary blinking   ? per pt,going on since 2018  ? GLUCOSE INTOLERANCE 08/01/2007  ? Hypercalcemia due to sarcoidosis 2014  ? HYPERLIPIDEMIA 08/01/2007  ? no meds  ? HYPERSOMNIA 07/28/2008  ? HYPERTENSION 04/17/2007  ? Impaired glucose tolerance 03/23/2011  ? JOINT EFFUSION, LEFT KNEE 06/02/2010  ? Loose body in knee 04/01/2009  ? pt not sure?  ? Metabolic encephalopathy 095/62/[id number]/7846 ? Migraines   ? "stopped 3-4 yr ago" (07/23/2012)  ? [place] 04/20/2007  ? OTHER DISEASES OF LUNG NOT ELSEWHERE CLASSIFIED 08/01/2007  ? Pain in joint, lower leg 04/01/2009  ? PERIPHERAL EDEMA 04/21/2009  ? Pre-diabetes   ? Sarcoidosis 09/25/2007  ? SHOULDER PAIN, LEFT 04/01/2009  ? Sleep apnea   ? does not use Cpap  ? ?Past Surgical History:  ?Procedure Laterality Date  ? BREAST SURGERY    ? Biopsy benign/bil breaST  ? CATARACT EXTRACTION    ? RIGHT EYE  ? COLONOSCOPY    ? COMBINED MEDIASTINOSCOPY AND BRONCHOSCOPY  08/2007  ? COMBINED MEDIASTINOSCOPY AND BRONCHOSCOPY  2009  ? Dental implant    ? FRACTURE SURGERY  ?  02/1997  ? "left upper arm; put rod in" (07/23/2012)  ? GUM SURGERY  2000-?2009  ? "several ORs; soft tissue graft; took material from roof of mouth" (07/23/2012  ? HUMERUS SURGERY Left 1998  ? rod insertion  ? KNEE ARTHROSCOPY  03/2004; 06/2009  ? "right; left" Dr. Theda Sers  ? KNEE SURGERY  2012  ? ARTHROSCOPIC LEFT KNEE  ? LYMPH NODE BIOPSY  ~ 2009  ? "for sarcoidosis; don't know exactly which nodes" (07/23/2102)  ? MYOMECTOMY  1994  ? Open  ? REFRACTIVE SURGERY  08/1998  ? "both eyes" (07/23/2012)  ? REFRACTIVE SURGERY  2000  ? Bil  ? TOTAL KNEE ARTHROPLASTY Left 09/24/2016  ? Procedure: TOTAL KNEE ARTHROPLASTY;  Surgeon: Dorna Leitz, MD;  Location: [facility];  Service: Orthopedics;  Laterality:  Left;  ? ? reports that she has never smoked. She has never used smokeless tobacco. She reports current alcohol use. She reports that she does not use drugs. ?family history includes Asthma in her sister; Diabetes in her mother; Gout in her sister; Heart disease (age of onset: 34) in her father; Hypertension in her father, mother, and sister; Stroke in her mother. ?Allergies  ?Allergen Reactions  ? Fluoxetine Hcl Other (See Comments)  ?  (PROZAC) Suicidal thoughts  ? Sulfa Antibiotics Rash  ? Chocolate Other (See Comments)  ?  SOMETIMES CAUSES SEVERE HEADACHES  ? Floxin [Ocuflox] Anxiety  ?  shaky  ? Other Nausea And Vomiting  ?  INSTANT ICED TEA PACKETS  ? Sulfonamide Derivatives Rash  ? ?Current Outpatient Medications on File Prior to Visit  ?Medication Sig Dispense Refill  ? amoxicillin (AMOXIL) 500 MG tablet Prior to dental appointments    ? antiseptic oral rinse (BIOTENE) LIQD 15 mLs by Mouth Rinse route 2 (two) times daily as needed for dry mouth.    ? aspirin EC 81 MG tablet Take 81 mg by mouth daily.    ? atenolol (TENORMIN) 50 MG tablet TAKE 1 TABLET(50 MG) BY MOUTH DAILY 90 tablet 3  ? baclofen (LIORESAL) 10 MG tablet Take 10 mg by mouth 2 (two) times daily.    ? BOTOX 200 units SOLR     ? brimonidine-timolol (COMBIGAN) 0.2-0.5 % ophthalmic solution     ? carboxymethylcellulose (REFRESH PLUS) 0.5 % SOLN with meals as needed.    ? clorazepate (TRANXENE) 7.5 MG tablet Take 7.5 mg by mouth 3 (three) times daily.    ? desvenlafaxine (PRISTIQ) 100 MG 24 hr tablet Take 100 mg by mouth at bedtime.    ? diltiazem (CARDIZEM CD) 240 MG 24 hr capsule Take 240 mg by mouth daily.    ? ferrous sulfate 325 (65 FE) MG tablet Take 325 mg by mouth daily with breakfast.    ? furosemide (LASIX) 40 MG tablet Take 1 tablet (40 mg total) by mouth 2 (two) times daily. (Patient taking differently: Take 40 mg by mouth daily.) 30 tablet 0  ? hydrALAZINE (APRESOLINE) 100 MG tablet Take 1 tablet (100 mg total) by mouth every 8 (eight)  hours. (Patient taking differently: Take 100 mg by mouth 3 (three) times daily.)    ? Melatonin 5 MG TABS Take 10 mg by mouth at bedtime.    ? Multiple Vitamins-Minerals (MULTIVITAMIN WITH MINERALS) tablet Take 1 tablet by mouth daily.    ? Omega 3 1200 MG CAPS Take 2,400 mg by mouth at bedtime.    ? PREVIDENT 5000 BOOSTER PLUS 1.1 % PSTE See admin instructions.    ?  PREVIDENT 5000 DRY MOUTH 1.1 % GEL dental gel See admin instructions.    ? VYZULTA 0.024 % SOLN Apply 1 drop to eye at bedtime.    ? zaleplon (SONATA) 10 MG capsule Take 10 mg by mouth at bedtime as needed for sleep.    ? albuterol (VENTOLIN HFA) 108 (90 Base) MCG/ACT inhaler Inhale 2 puffs into the lungs every 6 (six) hours as needed for wheezing or shortness of breath. (Patient not taking: Reported on 10/10/2021) 8 g 0  ? cetirizine (ZYRTEC) 10 MG tablet  (Patient not taking: Reported on 05/22/2021)    ? dextromethorphan-guaiFENesin (MUCINEX DM) 30-600 MG 12hr tablet Take 1 tablet by mouth 2 (two) times daily. (Patient not taking: Reported on 05/22/2021) 15 tablet 0  ? fluticasone (FLONASE) 50 MCG/ACT nasal spray Place 1-2 sprays into both nostrils daily. (Patient not taking: Reported on 05/22/2021) 16 g 0  ? triamcinolone (NASACORT) 55 MCG/ACT AERO nasal inhaler 1 spray in each nostril (Patient not taking: Reported on 05/22/2021)    ? ?No current facility-administered medications on file prior to visit.  ? ?     ROS:  All others reviewed and negative. ? ?Objective  ? ?     PE:  BP 122/76 (BP Location: Right Arm, Patient Position: Sitting, Cuff Size: Large)   Pulse 68   Temp 97.9 ?F (36.6 ?C) (Oral)   Ht '5\' 8"'$  (1.727 m)   Wt 192 lb (87.1 kg)   SpO2 98%   BMI 29.19 kg/m?  ? ?              Constitutional: Pt appears in NAD ?              HENT: Head: NCAT.  ?              Right Ear: External ear normal.   ?              Left Ear: External ear normal.  ?              Eyes: . Pupils are equal, round, and reactive to light. Conjunctivae and EOM are  normal ?              Nose: without d/c or deformity ?              Neck: Neck supple. Gross normal ROM ?              Cardiovascular: Normal rate and regular rhythm.   ?              Pulmonary/Chest: Effort normal and

## 2021-10-10 NOTE — Assessment & Plan Note (Signed)
BP Readings from Last 3 Encounters:  ?10/10/21 122/76  ?05/22/21 124/76  ?04/11/21 106/60  ? ?Stable, pt to continue medical treatment tenormin, cardizem, hydralazine ? ?

## 2021-10-10 NOTE — Addendum Note (Signed)
Addended by: Biagio Borg on: 10/10/2021 10:35 PM ? ? Modules accepted: Orders ? ?

## 2021-10-11 NOTE — Progress Notes (Signed)
HPI F never smoker followed for sarcoid, OSA/ failed CPAP, complicated by hypercalcemia, renal insufficency, glaucoma PFT 02/07/15-minimal obstructive airways disease, minimal diffusion defect, insignificant response to bronchodilator ACE in July, 2017 remained elevated 74 (8-52) NPSG  12/ 11/09 - AhI 1.4/hour I desaturation to 89%, body weight 245 lbs Unattended Home Sleep Test 03/12/17-AHI 27.6/hour, desaturation to 75%, body weight 220 pounds PFT 03/11/19- minimal obstruction, no resp to BD, likely mild restriction --------------------------------------------------------------------------   09/07/20- 71 year old female never smoker followed for Sarcoid, Insomnia, OSA/ oral appliance Dr Toy Cookey complicated by hypercalcemia, renal insufficiency, HBP, depression, glaucoma Adderall 30 bid, Sonata 10,  Body weight today-190 lbs Covid vax-3 Moderna Flu vax-had -----Patient states that she has been having some discomfort with breathing. Not sleeping well, states she sleeps about 4 hours and has trouble going back to sleep, still using oral device for OSA. Says oral appliance for OSA is not really comfortable and she would like to talk with Dr Redmond Baseman again about Dawna Part, now that she has lost about 40 lbs since last there. There are overlapping discomforts between headaches [facility], facial tics, and burning sensation in cheeks and inside nose she describes as "nerves pulling". No longer on Adderall at all. Dr [facility] indicated she would prescribe Lunesta. Sonata isn't lasting long enough. Discussed relative half-lives of these meds.   10/12/21- 71 year old female never smoker followed for Sarcoid, Insomnia, OSA/ oral appliance Dr Toy Cookey, complicated by Hypercalcemia, CKD4, HTN, depression, glaucoma, Migraine, Bipolar,  No longer using OAP. Just wears Night Guard, -Sonata Body weight today-190 lbs Covid vax-5 Moderna Flu vax-had -----Patient does not wear oral  device for sleep apnea. Has been having some headaches throughout the day She saw Dr. Johnnette Gourd, considering Dawna Part and understood she needed to have an updated sleep study to move forward.  We will schedule at with her wearing her "night guard" bruxism mouthpiece. Trial with OAP did not help headaches, which she describes as starting with a painful itch in her nose..   She still takes Sonata but action is too short.  ROS- see HPI  + = positive Constitutional:     weight loss, no-night sweats, fevers, chills, + fatigue, lassitude. HEENT:   + headaches, +difficulty swallowing, tooth/dental problems, sore throat,       No-  sneezing, itching, ear ache, nasal congestion, post nasal drip,  CV:  No- chest pain, no-orthopnea, PND, swelling in lower extremities, anasarca,dizziness, +palpitations  Resp: + shortness of breath with exertion or at rest.              No-   productive cough,  + non-productive cough,  No- coughing up of blood.              No- wheezing.   Skin: No-   rash or lesions.  +cheeks burn GI:  No-   heartburn, indigestion, abdominal pain, nausea, vomiting,  GU:  MS:  +  joint pain or swelling.   Neuro-     +facial tics Psych:  No- change in mood or affect. No depression or anxiety.  No memory loss.  OBJ- Physical Exam   General- Alert, Oriented, Affect-appropriate, Distress- none acute,  + overweight Skin- rash-none, lesions- none, excoriation- none,  Lymphadenopathy- none Head- atraumatic            Eyes- +continues frequent blinking             Ears- Hearing, canals-normal  Nose- Clear, no-Septal dev, mucus, polyps, erosion, perforation             Throat- Mallampati III, mucosa clear , drainage- none, tonsils- atrophic Neck- flexible , trachea midline, no stridor , thyroid nl, carotid no bruit Chest - symmetrical excursion , unlabored           Heart/CV- RRR + extra beats , no murmur , no gallop  , no rub, nl s1 s2                           - JVD- none ,,  varices- none           Lung- clear to P&A, wheeze- none, cough- none , dullness-none, rub- none           Chest wall-  Abd-  Br/ Gen/ Rectal- Not done, not indicated Extrem- cyanosis- none, clubbing, none, atrophy- none, strength- nl Neuro- +blinking, grimacing, ? Tics or frontal lobe ?

## 2021-10-12 ENCOUNTER — Ambulatory Visit (INDEPENDENT_AMBULATORY_CARE_PROVIDER_SITE_OTHER): Payer: PRIVATE HEALTH INSURANCE | Admitting: Internal Medicine

## 2021-10-12 ENCOUNTER — Ambulatory Visit (INDEPENDENT_AMBULATORY_CARE_PROVIDER_SITE_OTHER): Payer: BC Managed Care – PPO

## 2021-10-12 ENCOUNTER — Encounter: Payer: Self-pay | Admitting: Internal Medicine

## 2021-10-12 VITALS — BP 120/70 | HR 61 | Temp 97.6°F | Ht 66.0 in | Wt 194.2 lb

## 2021-10-12 DIAGNOSIS — G4733 Obstructive sleep apnea (adult) (pediatric): Secondary | ICD-10-CM

## 2021-10-12 DIAGNOSIS — D869 Sarcoidosis, unspecified: Secondary | ICD-10-CM

## 2021-10-12 DIAGNOSIS — R0609 Other forms of dyspnea: Secondary | ICD-10-CM

## 2021-10-12 DIAGNOSIS — F5101 Primary insomnia: Secondary | ICD-10-CM

## 2021-10-12 MED ORDER — ESZOPICLONE 2 MG PO TABS
2.0000 mg | ORAL_TABLET | Freq: Every evening | ORAL | 1 refills | Status: DC | PRN
Start: 2021-10-12 — End: 2021-11-01

## 2021-10-12 NOTE — Patient Instructions (Addendum)
Order- schedule home seep test   dx OSA ? ?Order - CXR      dx Dyspnea on exertion ? ?Script sent for Lunesta 2 mg- take at bedtime for sleep ? ? ?

## 2021-10-31 DIAGNOSIS — F32A Depression, unspecified: Secondary | ICD-10-CM | POA: Diagnosis not present

## 2021-10-31 DIAGNOSIS — G43719 Chronic migraine without aura, intractable, without status migrainosus: Secondary | ICD-10-CM | POA: Diagnosis not present

## 2021-10-31 DIAGNOSIS — G4733 Obstructive sleep apnea (adult) (pediatric): Secondary | ICD-10-CM | POA: Diagnosis not present

## 2021-10-31 DIAGNOSIS — I10 Essential (primary) hypertension: Secondary | ICD-10-CM | POA: Diagnosis not present

## 2021-11-01 ENCOUNTER — Ambulatory Visit (AMBULATORY_SURGERY_CENTER): Payer: BC Managed Care – PPO

## 2021-11-01 VITALS — Ht 66.0 in | Wt 192.0 lb

## 2021-11-01 DIAGNOSIS — Z86010 Personal history of colonic polyps: Secondary | ICD-10-CM

## 2021-11-01 MED ORDER — PEG 3350-KCL-NA BICARB-NACL 420 G PO SOLR: 4000.0000 mL | Freq: Once | ORAL | 0 refills | Status: AC

## 2021-11-01 NOTE — Progress Notes (Signed)
No egg or soy allergy known to patient  ?No issues known to pt with past sedation with any surgeries or procedures ?Patient denies ever being told they had issues or difficulty with intubation  ?No FH of Malignant Hyperthermia ?Pt is not on diet pills ?Pt is not on home 02  ?Pt is not on blood thinners  ?Pt denies issues with constipation  ?No A fib or A flutter ?NO PA's for preps discussed with pt in PV today  ?Discussed with pt there will be an out-of-pocket cost for prep and that varies from $0 to 70 + dollars - pt verbalized understanding  ?Pt instructed to use Singlecare.com or GoodRx for a price reduction on prep  ?PV completed over the phone. Pt verified name, DOB, address and insurance during PV today.  ?Pt mailed instruction packet with copy of consent form to read and not return, and instructions.  ?Pt encouraged to call with questions or issues.  ?If pt has My chart, procedure instructions sent via My Chart  ? ?

## 2021-11-02 ENCOUNTER — Ambulatory Visit
Admission: RE | Admit: 2021-11-02 | Discharge: 2021-11-02 | Disposition: A | Discharge status: Home / Self Care | Payer: BC Managed Care – PPO | Source: Ambulatory Visit | Attending: Internal Medicine | Admitting: Internal Medicine

## 2021-11-02 DIAGNOSIS — Z1231 Encounter for screening mammogram for malignant neoplasm of breast: Secondary | ICD-10-CM | POA: Diagnosis not present

## 2021-11-08 DIAGNOSIS — G4700 Insomnia, unspecified: Secondary | ICD-10-CM | POA: Insufficient documentation

## 2021-11-08 NOTE — Assessment & Plan Note (Signed)
She did not like CPAP or Oral appliance.  I am not sure how she would do as an [facility] candidate but we will update her home sleep test as requested and she can follow through with Dr. Redmond Baseman. ?

## 2021-11-08 NOTE — Assessment & Plan Note (Signed)
Difficulty initiating and maintaining sleep.  Sleep hygiene reviewed.  Options discussed. ?Plan-try Lunesta 2 mg with appropriate discussion ?

## 2021-11-08 NOTE — Assessment & Plan Note (Signed)
She asked CXR to update status.  Stable chronic low-grade cough usually nonproductive.  No adenopathy rash or night sweats. ?Plan-CXR ?

## 2021-11-10 ENCOUNTER — Encounter: Payer: Self-pay | Admitting: Gastroenterology

## 2021-11-21 DIAGNOSIS — H401113 Primary open-angle glaucoma, right eye, severe stage: Secondary | ICD-10-CM | POA: Diagnosis not present

## 2021-11-21 DIAGNOSIS — H401122 Primary open-angle glaucoma, left eye, moderate stage: Secondary | ICD-10-CM | POA: Diagnosis not present

## 2021-11-22 ENCOUNTER — Encounter: Payer: Self-pay | Admitting: Gastroenterology

## 2021-11-22 ENCOUNTER — Ambulatory Visit (AMBULATORY_SURGERY_CENTER): Payer: BC Managed Care – PPO | Admitting: Gastroenterology

## 2021-11-22 ENCOUNTER — Other Ambulatory Visit: Payer: Self-pay

## 2021-11-22 VITALS — BP 124/67 | HR 60 | Temp 97.5°F | Resp 18 | Ht 66.0 in | Wt 192.0 lb

## 2021-11-22 DIAGNOSIS — Z1211 Encounter for screening for malignant neoplasm of colon: Secondary | ICD-10-CM | POA: Diagnosis not present

## 2021-11-22 DIAGNOSIS — Z86010 Personal history of colonic polyps: Secondary | ICD-10-CM | POA: Diagnosis not present

## 2021-11-22 DIAGNOSIS — D123 Benign neoplasm of transverse colon: Secondary | ICD-10-CM

## 2021-11-22 DIAGNOSIS — I4891 Unspecified atrial fibrillation: Secondary | ICD-10-CM

## 2021-11-22 MED ORDER — SODIUM CHLORIDE 0.9 % IV SOLN: 500.0000 mL | Freq: Once | INTRAVENOUS | Status: DC

## 2021-11-22 NOTE — Progress Notes (Signed)
VS completed by AS.  Pt's states no medical or surgical changes since previsit or office visit.  

## 2021-11-22 NOTE — Progress Notes (Signed)
At approx cecal time, pt went into what looked like a-fib/flutter.  BP was stable.  I captured EKG in leads 1 and 2 (3 would not print). Procedure finish with no real change and first strip in PACU showed same thing.  12 lead ordered in PACU by Dr Delane Ginger ?

## 2021-11-22 NOTE — Patient Instructions (Signed)
Handouts on polyps, hemorrhoids, and diverticulosis given to patient. ?Await pathology results. ?Resume previous diet and continue present medications. ?Repeat colonoscopy in 3-5 years for surveillance once pathology results come back. ?Referral to cardiology - call to make appointment ASAP new onset of Atrial Fibrillation (irregular heart rhythm) ? ? ?YOU HAD AN ENDOSCOPIC PROCEDURE TODAY AT [facility]:   Refer to the procedure report that was given to you for any specific questions about what was found during the examination.  If the procedure report does not answer your questions, please call your gastroenterologist to clarify.  If you requested that your care partner not be given the details of your procedure findings, then the procedure report has been included in a sealed envelope for you to review at your convenience later. ? ?YOU SHOULD EXPECT: Some feelings of bloating in the abdomen. Passage of more gas than usual.  Walking can help get rid of the air that was put into your GI tract during the procedure and reduce the bloating. If you had a lower endoscopy (such as a colonoscopy or flexible sigmoidoscopy) you may notice spotting of blood in your stool or on the toilet paper. If you underwent a bowel prep for your procedure, you may not have a normal bowel movement for a few days. ? ?Please Note:  You might notice some irritation and congestion in your nose or some drainage.  This is from the oxygen used during your procedure.  There is no need for concern and it should clear up in a day or so. ? ?SYMPTOMS TO REPORT IMMEDIATELY: ? ?Following lower endoscopy (colonoscopy or flexible sigmoidoscopy): ? Excessive amounts of blood in the stool ? Significant tenderness or worsening of abdominal pains ? Swelling of the abdomen that is new, acute ? Fever of 100?F or higher ? ?For urgent or emergent issues, a gastroenterologist can be reached at any hour by calling [phone]. ?Do not use  MyChart messaging for urgent concerns.  ? ? ?DIET:  We do recommend a small meal at first, but then you may proceed to your regular diet.  Drink plenty of fluids but you should avoid alcoholic beverages for 24 hours. ? ?ACTIVITY:  You should plan to take it easy for the rest of today and you should NOT DRIVE or use heavy machinery until tomorrow (because of the sedation medicines used during the test).   ? ?FOLLOW UP: ?Our staff will call the number listed on your records 48-72 hours following your procedure to check on you and address any questions or concerns that you may have regarding the information given to you following your procedure. If we do not reach you, we will leave a message.  We will attempt to reach you two times.  During this call, we will ask if you have developed any symptoms of COVID 19. If you develop any symptoms (ie: fever, flu-like symptoms, shortness of breath, cough etc.) before then, please call [phone].  If you test positive for Covid 19 in the 2 weeks post procedure, please call and report this information to [place].   ? ?If any biopsies were taken you will be contacted by phone or by letter within the next 1-3 weeks.  Please call us at [phone] if you have not heard about the biopsies in 3 weeks.  ? ? ?SIGNATURES/CONFIDENTIALITY: ?You and/or your care partner have signed paperwork which will be entered into your electronic medical record.  These signatures attest to the fact that that  the information above on your After Visit Summary has been reviewed and is understood.  Full responsibility of the confidentiality of this discharge information lies with you and/or your care-partner.  ?

## 2021-11-22 NOTE — Progress Notes (Signed)
Patient was noted to have abnormal irregular heart rate on monitor. 12 lead EKG consistent with Afib. ? ?Vitals are stable and patient is asymptomatic ?Will send urgent referral to cardiology for evaluation. Advised patient to contact our office with any change in symptoms and will need to come to ER ? ?The patient was provided an opportunity to ask questions and all were answered. The patient agreed with the plan and demonstrated an understanding of the instructions.  ?

## 2021-11-22 NOTE — Progress Notes (Signed)
Report to PACU, RN, vss, BBS= Clear.  

## 2021-11-22 NOTE — Op Note (Signed)
[place] ?Patient Name: Tanisia [name] ?Procedure Date: 11/22/2021 11:53 AM ?MRN: [id number] ?Endoscopist: Mauri Pole , MD ?Age: 71 ?Referring MD:  ?Date of Birth: Aug 25, 1950 ?Gender: Female ?Account #: [id number] ?Procedure:                Colonoscopy ?Indications:              High risk colon cancer surveillance: Personal  ?                          history of colonic polyps, High risk colon cancer  ?                          surveillance: Personal history of adenoma (10 mm or  ?                          greater in size) ?Medicines:                Monitored Anesthesia Care ?Procedure:                Pre-Anesthesia Assessment: ?                          - Prior to the procedure, a History and Physical  ?                          was performed, and patient medications and  ?                          allergies were reviewed. The patient's tolerance of  ?                          previous anesthesia was also reviewed. The risks  ?                          and benefits of the procedure and the sedation  ?                          options and risks were discussed with the patient.  ?                          All questions were answered, and informed consent  ?                          was obtained. Prior Anticoagulants: The patient has  ?                          taken no previous anticoagulant or antiplatelet  ?                          agents. ASA Grade Assessment: II - A patient with  ?                          mild systemic disease. After reviewing the risks  ?  and benefits, the patient was deemed in  ?                          satisfactory condition to undergo the procedure. ?                          After obtaining informed consent, the colonoscope  ?                          was passed under direct vision. Throughout the  ?                          procedure, the patient's blood pressure, pulse, and  ?                          oxygen saturations were monitored  continuously. The  ?                          Olympus PCF-H190DL (#[id number]) Colonoscope was  ?                          introduced through the anus and advanced to the the  ?                          cecum, identified by appendiceal orifice and  ?                          ileocecal valve. The colonoscopy was performed  ?                          without difficulty. The patient tolerated the  ?                          procedure well. The quality of the bowel  ?                          preparation was good. The ileocecal valve,  ?                          appendiceal orifice, and rectum were photographed. ?Scope In: 11:59:12 AM ?Scope Out: 12:10:35 PM ?Scope Withdrawal Time: 0 hours 5 minutes 59 seconds  ?Total Procedure Duration: 0 hours 11 minutes 23 seconds  ?Findings:                 The perianal and digital rectal examinations were  ?                          normal. ?                          A 10 mm polyp was found in the transverse colon.  ?                          The polyp was sessile. The polyp was removed with a  ?  cold snare. Resection and retrieval were complete. ?                          Scattered small-mouthed diverticula were found in  ?                          the sigmoid colon, descending colon, transverse  ?                          colon and ascending colon. ?                          Non-bleeding external and internal hemorrhoids were  ?                          found during retroflexion. The hemorrhoids were  ?                          small. ?Complications:            No immediate complications. ?Estimated Blood Loss:     Estimated blood loss was minimal. ?Impression:               - One 10 mm polyp in the transverse colon, removed  ?                          with a cold snare. Resected and retrieved. ?                          - Diverticulosis in the sigmoid colon, in the  ?                          descending colon, in the transverse colon and in  ?                           the ascending colon. ?                          - Non-bleeding external and internal hemorrhoids. ?Recommendation:           - Patient has a contact number available for  ?                          emergencies. The signs and symptoms of potential  ?                          delayed complications were discussed with the  ?                          patient. Return to normal activities tomorrow.  ?                          Written discharge instructions were provided to the  ?                          patient. ?                          -  Resume previous diet. ?                          - Continue present medications. ?                          - Await pathology results. ?                          - Repeat colonoscopy in 3 - 5 years for  ?                          surveillance based on pathology results. ?Mauri Pole, MD ?11/22/2021 12:13:24 PM ?This report has been signed electronically. ?

## 2021-11-22 NOTE — Progress Notes (Signed)
[facility] History and Physical ? ? ?Primary Care Physician:  Biagio Borg, MD ? ? ?Reason for Procedure:  History of adenomatous colon polyps ? ?Plan:    Surveillance colonoscopy with possible interventions as needed ? ? ? ? ?HPI: Tara [name] is a very pleasant 71 y.o. female here for surveillance colonoscopy. ?Denies any nausea, vomiting, abdominal pain, melena or bright red blood per rectum ? ?The risks and benefits as well as alternatives of endoscopic procedure(s) have been discussed and reviewed. All questions answered. The patient agrees to proceed. ? ? ? ?Past Medical History:  ?Diagnosis Date  ? Anemia   ? on meds  ? ANKLE PAIN, LEFT 04/01/2008  ? ANXIETY 04/17/2007  ? on meds  ? Arthritis   ? generalized  ? Cataract   ? bilateral sx  ? Chronic kidney disease   ? stage 3  ? Colon polyp   ? COLONIC POLYPS, HX OF 08/01/2007  ? CONTUSIONS, MULTIPLE 04/01/2009  ? DEPRESSION 04/17/2007  ? on meds  ? DIZZINESS 08/01/2007  ? DYSPNEA 08/01/2007  ? with exertion  ? Enlargement of lymph nodes 08/13/2007  ? Excessive involuntary blinking   ? per pt,going on since 2018  ? Glaucoma   ? on meds  ? GLUCOSE INTOLERANCE 08/01/2007  ? Hypercalcemia due to sarcoidosis 2014  ? HYPERLIPIDEMIA 08/01/2007  ? no meds  ? HYPERSOMNIA 07/28/2008  ? HYPERTENSION 04/17/2007  ? Impaired glucose tolerance 03/23/2011  ? JOINT EFFUSION, LEFT KNEE 06/02/2010  ? Loose body in knee 04/01/2009  ? pt not sure?  ? Metabolic encephalopathy 82/70/7867-54/4920  ? Migraines   ? "stopped 3-4 yr ago" (07/23/2012)  ? Morbid obesity ([place]) 04/20/2007  ? OTHER DISEASES OF LUNG NOT ELSEWHERE CLASSIFIED 08/01/2007  ? Pain in joint, lower leg 04/01/2009  ? PERIPHERAL EDEMA 04/21/2009  ? Pre-diabetes   ? Sarcoidosis 09/25/2007  ? Seasonal allergies   ? SHOULDER PAIN, LEFT 04/01/2009  ? Sleep apnea   ? does not use Cpap  ? ? ?Past Surgical History:  ?Procedure Laterality Date  ? BREAST SURGERY    ? Biopsy benign/bil breaST  ? CATARACT EXTRACTION     ? RIGHT EYE  ? COLONOSCOPY  2020  ? KN-MAC-suprep(good)-tics/TA's  ? COMBINED MEDIASTINOSCOPY AND BRONCHOSCOPY  08/2007  ? COMBINED MEDIASTINOSCOPY AND BRONCHOSCOPY  2009  ? Dental implant    ? FRACTURE SURGERY  ?02/1997  ? "left upper arm; put rod in" (07/23/2012)  ? GUM SURGERY  2000-?2009  ? "several ORs; soft tissue graft; took material from roof of mouth" (07/23/2012  ? HUMERUS SURGERY Left 1998  ? rod insertion  ? KNEE ARTHROSCOPY  03/2004; 06/2009  ? "right; left" Dr. Theda Sers  ? KNEE SURGERY  2012  ? ARTHROSCOPIC LEFT KNEE  ? LYMPH NODE BIOPSY  ~ 2009  ? "for sarcoidosis; don't know exactly which nodes" (07/23/2102)  ? MYOMECTOMY  1994  ? Open  ? POLYPECTOMY  2020  ? TA's  ? REFRACTIVE SURGERY  08/1998  ? "both eyes" (07/23/2012)  ? REFRACTIVE SURGERY  2000  ? Bil  ? TOTAL KNEE ARTHROPLASTY Left 09/24/2016  ? Procedure: TOTAL KNEE ARTHROPLASTY;  Surgeon: Dorna Leitz, MD;  Location: [facility];  Service: Orthopedics;  Laterality: Left;  ? ? ?Prior to Admission medications   ?Medication Sig Start Date End Date Taking? Authorizing Provider  ?aspirin EC 81 MG tablet Take 81 mg by mouth daily.   Yes [provider]  ?atenolol (TENORMIN) 50 MG tablet TAKE 1  TABLET(50 MG) BY MOUTH DAILY 04/17/21  Yes Biagio Borg, MD  ?brimonidine-timolol (COMBIGAN) 0.2-0.5 % ophthalmic solution  12/02/19  Yes [provider]  ?clorazepate (TRANXENE) 7.5 MG tablet Take 7.5 mg by mouth 3 (three) times daily.   Yes [provider]  ?desvenlafaxine (PRISTIQ) 100 MG 24 hr tablet Take 100 mg by mouth at bedtime.   Yes [provider]  ?diltiazem (DILACOR XR) 240 MG 24 hr capsule Take 1 capsule by mouth daily at 6 (six) AM. 12/17/20  Yes [provider]  ?furosemide (LASIX) 40 MG tablet Take 1 tablet (40 mg total) by mouth 2 (two) times daily. ?Patient taking differently: Take 40 mg by mouth daily. 12/27/15  Yes Hosie Poisson, MD  ?hydrALAZINE (APRESOLINE) 100 MG tablet Take 1 tablet (100 mg total) by mouth  every 8 (eight) hours. ?Patient taking differently: Take 100 mg by mouth 3 (three) times daily. 12/22/15  Yes Hosie Poisson, MD  ?Melatonin 5 MG TABS Take 10 mg by mouth at bedtime.   Yes [provider]  ?Multiple Vitamins-Minerals (MULTIVITAMIN WITH MINERALS) tablet Take 1 tablet by mouth daily.   Yes [provider]  ?Omega 3 1200 MG CAPS Take 2,400 mg by mouth at bedtime.   Yes [provider]  ?PREVIDENT 5000 BOOSTER PLUS 1.1 % PSTE See admin instructions. 11/12/20  Yes [provider]  ?PREVIDENT 5000 DRY MOUTH 1.1 % GEL dental gel See admin instructions. 09/22/19  Yes [provider]  ?VITAMIN D PO Take 1 tablet by mouth daily at 6 (six) AM.   Yes [provider]  ?VYZULTA 0.024 % SOLN Apply 1 drop to eye at bedtime. 09/05/21  Yes [provider]  ?zaleplon (SONATA) 10 MG capsule Take 10 mg by mouth at bedtime as needed for sleep.   Yes [provider]  ?amoxicillin (AMOXIL) 500 MG tablet Prior to dental appointments 08/03/19   [provider]  ?antiseptic oral rinse (BIOTENE) LIQD 15 mLs by Mouth Rinse route 2 (two) times daily as needed for dry mouth.    [provider]  ?baclofen (LIORESAL) 10 MG tablet Take 10 mg by mouth 2 (two) times daily as needed. 09/01/19   [provider]  ?BOTOX 200 units SOLR  09/27/20   [provider]  ?carboxymethylcellulose (REFRESH PLUS) 0.5 % SOLN 1 drop 3 (three) times daily as needed. RETAIN eye drops instead of refresh    [provider]  ?Erenumab-aooe (AIMOVIG) 70 MG/ML SOAJ Inject 1 Dose into the skin every 30 (thirty) days. ?Patient not taking: Reported on 11/01/2021 10/31/21   [provider]  ?ferrous sulfate 325 (65 FE) MG tablet Take 325 mg by mouth daily with breakfast.    [provider]  ? ? ?Current Outpatient Medications  ?Medication Sig Dispense Refill  ? aspirin EC 81 MG tablet Take 81 mg by mouth daily.    ? atenolol (TENORMIN) 50 MG  tablet TAKE 1 TABLET(50 MG) BY MOUTH DAILY 90 tablet 3  ? brimonidine-timolol (COMBIGAN) 0.2-0.5 % ophthalmic solution     ? clorazepate (TRANXENE) 7.5 MG tablet Take 7.5 mg by mouth 3 (three) times daily.    ? desvenlafaxine (PRISTIQ) 100 MG 24 hr tablet Take 100 mg by mouth at bedtime.    ? diltiazem (DILACOR XR) 240 MG 24 hr capsule Take 1 capsule by mouth daily at 6 (six) AM.    ? furosemide (LASIX) 40 MG tablet Take 1 tablet (40 mg total) by mouth 2 (  two) times daily. (Patient taking differently: Take 40 mg by mouth daily.) 30 tablet 0  ? hydrALAZINE (APRESOLINE) 100 MG tablet Take 1 tablet (100 mg total) by mouth every 8 (eight) hours. (Patient taking differently: Take 100 mg by mouth 3 (three) times daily.)    ? Melatonin 5 MG TABS Take 10 mg by mouth at bedtime.    ? Multiple Vitamins-Minerals (MULTIVITAMIN WITH MINERALS) tablet Take 1 tablet by mouth daily.    ? Omega 3 1200 MG CAPS Take 2,400 mg by mouth at bedtime.    ? PREVIDENT 5000 BOOSTER PLUS 1.1 % PSTE See admin instructions.    ? PREVIDENT 5000 DRY MOUTH 1.1 % GEL dental gel See admin instructions.    ? VITAMIN D PO Take 1 tablet by mouth daily at 6 (six) AM.    ? VYZULTA 0.024 % SOLN Apply 1 drop to eye at bedtime.    ? zaleplon (SONATA) 10 MG capsule Take 10 mg by mouth at bedtime as needed for sleep.    ? amoxicillin (AMOXIL) 500 MG tablet Prior to dental appointments    ? antiseptic oral rinse (BIOTENE) LIQD 15 mLs by Mouth Rinse route 2 (two) times daily as needed for dry mouth.    ? baclofen (LIORESAL) 10 MG tablet Take 10 mg by mouth 2 (two) times daily as needed.    ? BOTOX 200 units SOLR  (Patient not taking: Reported on 11/01/2021)    ? carboxymethylcellulose (REFRESH PLUS) 0.5 % SOLN 1 drop 3 (three) times daily as needed. RETAIN eye drops instead of refresh    ? Erenumab-aooe (AIMOVIG) 70 MG/ML SOAJ Inject 1 Dose into the skin every 30 (thirty) days. (Patient not taking: Reported on 11/01/2021)    ? ferrous sulfate 325 (65 FE) MG tablet  Take 325 mg by mouth daily with breakfast.    ? ?Current Facility-Administered Medications  ?Medication Dose Route Frequency Provider Last Rate Last Admin  ? 0.9 %  sodium chloride infusion  500 mL In

## 2021-11-22 NOTE — Progress Notes (Signed)
Called to room to assist during endoscopic procedure.  Patient ID and intended procedure confirmed with present staff. Received instructions for my participation in the procedure from the performing physician.  

## 2021-11-23 ENCOUNTER — Encounter: Payer: Self-pay | Admitting: Internal Medicine

## 2021-11-23 ENCOUNTER — Ambulatory Visit (INDEPENDENT_AMBULATORY_CARE_PROVIDER_SITE_OTHER): Payer: BC Managed Care – PPO | Admitting: Internal Medicine

## 2021-11-23 ENCOUNTER — Encounter: Payer: Self-pay | Admitting: Gastroenterology

## 2021-11-23 VITALS — BP 116/70 | HR 65 | Ht 66.0 in | Wt 189.8 lb

## 2021-11-23 DIAGNOSIS — I480 Paroxysmal atrial fibrillation: Secondary | ICD-10-CM

## 2021-11-23 MED ORDER — APIXABAN 5 MG PO TABS: 5.0000 mg | ORAL_TABLET | Freq: Two times a day (BID) | ORAL | 3 refills | Status: DC

## 2021-11-23 NOTE — Patient Instructions (Addendum)
Medication Instructions:  ?STOP: Aspirin 81 mg daily ?START: Eliquis 5 mg twice a day ? ?*If you need a refill on your cardiac medications before your next appointment, please call your pharmacy* ? ? ?Lab Work: ?Your physician recommends that you return for lab work 1 week prior to cardioversion ( BMP, CBC) ? ?If you have labs (blood work) drawn today and your tests are completely normal, you will receive your results only by: ?MyChart Message (if you have MyChart) OR ?A paper copy in the mail ?If you have any lab test that is abnormal or we need to change your treatment, we will call you to review the results. ? ? ?Testing/Procedures: ?Cardioversion ?[facility] ? ? ?Follow-Up: ?At [facility], you and your health needs are our priority.  As part of our continuing mission to provide you with exceptional heart care, we have created designated Provider Care Teams.  These Care Teams include your primary Cardiologist (physician) and Advanced Practice Providers (APPs -  Physician Assistants and Nurse Practitioners) who all work together to provide you with the care you need, when you need it. ? ?We recommend signing up for the patient portal called "MyChart".  Sign up information is provided on this After Visit Summary.  MyChart is used to connect with patients for Virtual Visits (Telemedicine).  Patients are able to view lab/test results, encounter notes, upcoming appointments, etc.  Non-urgent messages can be sent to your provider as well.   ?To learn more about what you can do with MyChart, go to [email or web address].   ? ?Your next appointment:   ?3 month(s) ? ?The format for your next appointment:   ?In Person ? ?Provider:   ?Janina Mayo, MD { ? ? ?You are scheduled for a Cardioversion on Friday 12/15/21 with Dr. Oval Linsey.  Please arrive at the [facility] (Main Entrance A) at [facility]: [address] [place], [place] [address] at 9 am. ? ?DIET: Nothing to eat or drink after midnight  except a sip of water with medications (see medication instructions below) ? ?FYI: For your safety, and to allow [place] to monitor your vital signs accurately during the surgery/procedure we request that   ?if you have artificial nails, gel coating, SNS etc. Please have those removed prior to your surgery/procedure. Not having the nail coverings /polish removed may result in cancellation or delay of your surgery/procedure. ? ? ?Medication Instructions: ? ?Continue your anticoagulant: Eliquis 5 mg twice a day ?You will need to continue your anticoagulant after your procedure until you  are told by your  ?Provider that it is safe to stop ? ? ?Labs: 1 week prior to procedure (BMP, CBC) ? ?You must have a responsible person to drive you home and stay in the waiting area during your procedure. Failure to do so could result in cancellation. ? ?Interior and spatial designer cards. ? ?*Special Note: Every effort is made to have your procedure done on time. Occasionally there are emergencies that occur at the hospital that may cause delays. Please be patient if a delay does occur.  ? ? ?Important Information About Sugar ? ? ? ? ? ? ?

## 2021-11-23 NOTE — Progress Notes (Signed)
?Cardiology Office Note:   ? ?Date:  11/23/2021  ? ?ID:  Tara [name], DOB Feb 15, 1951, MRN [id number] ? ?PCP:  Biagio Borg, MD ?  ?[facility] Providers ?Cardiologist:  Janina Mayo, MD    ? ?Referring MD: Biagio Borg, MD  ? ?No chief complaint on file. ?Atrial Fibrillation ? ?History of Present Illness:   ? ?Tara [name] is a 71 y.o. female with a hx of CKD, sarcoidosis (+biopsy), referral for paroxysmal atrial fibrillation ? ?She was seen by GI for surveillance colonoscopy. During her colonoscopy noted to be in afib.  She broought in the 12 lead which shows atrial fibrillation.  TSH is normal. She has sleep apnea, difficulty with cpap adherence 2/2 dry eye exacerbation. She has a sleep study pending with pulmonology, sees Dr. Annamaria Boots. She's planned for repeat sleep study. She's being considered for Inspire. Blood pressures are well controlled. She denies significant ETOH. No GI bleeding history. No hx of stroke.  She's felt more tired than usual. She does not sense the palpations. No PND, orthopnea or LE edema.  ? ?Cardiology Studies: ?TTE 12/10/2015: EF normal, LAVi 41 cc/m2 ? ? ?Past Medical History:  ?Diagnosis Date  ? Anemia   ? on meds  ? ANKLE PAIN, LEFT 04/01/2008  ? ANXIETY 04/17/2007  ? on meds  ? Arthritis   ? generalized  ? Cataract   ? bilateral sx  ? Chronic kidney disease   ? stage 3  ? Colon polyp   ? COLONIC POLYPS, HX OF 08/01/2007  ? CONTUSIONS, MULTIPLE 04/01/2009  ? DEPRESSION 04/17/2007  ? on meds  ? DIZZINESS 08/01/2007  ? DYSPNEA 08/01/2007  ? with exertion  ? Enlargement of lymph nodes 08/13/2007  ? Excessive involuntary blinking   ? per pt,going on since 2018  ? Glaucoma   ? on meds  ? GLUCOSE INTOLERANCE 08/01/2007  ? Hypercalcemia due to sarcoidosis 2014  ? HYPERLIPIDEMIA 08/01/2007  ? no meds  ? HYPERSOMNIA 07/28/2008  ? HYPERTENSION 04/17/2007  ? Impaired glucose tolerance 03/23/2011  ? JOINT EFFUSION, LEFT KNEE 06/02/2010  ? Loose body in knee 04/01/2009  ? pt not sure?  ?  Metabolic encephalopathy 70/62/3762-83/1517  ? Migraines   ? "stopped 3-4 yr ago" (07/23/2012)  ? Morbid obesity ([place]) 04/20/2007  ? OTHER DISEASES OF LUNG NOT ELSEWHERE CLASSIFIED 08/01/2007  ? Pain in joint, lower leg 04/01/2009  ? PERIPHERAL EDEMA 04/21/2009  ? Pre-diabetes   ? Sarcoidosis 09/25/2007  ? Seasonal allergies   ? SHOULDER PAIN, LEFT 04/01/2009  ? Sleep apnea   ? does not use Cpap  ? ? ?Past Surgical History:  ?Procedure Laterality Date  ? BREAST SURGERY    ? Biopsy benign/bil breaST  ? CATARACT EXTRACTION    ? RIGHT EYE  ? COLONOSCOPY  2020  ? KN-MAC-suprep(good)-tics/TA's  ? COMBINED MEDIASTINOSCOPY AND BRONCHOSCOPY  08/2007  ? COMBINED MEDIASTINOSCOPY AND BRONCHOSCOPY  2009  ? Dental implant    ? FRACTURE SURGERY  ?02/1997  ? "left upper arm; put rod in" (07/23/2012)  ? GUM SURGERY  2000-?2009  ? "several ORs; soft tissue graft; took material from roof of mouth" (07/23/2012  ? HUMERUS SURGERY Left 1998  ? rod insertion  ? KNEE ARTHROSCOPY  03/2004; 06/2009  ? "right; left" Dr. Theda Sers  ? KNEE SURGERY  2012  ? ARTHROSCOPIC LEFT KNEE  ? LYMPH NODE BIOPSY  ~ 2009  ? "for sarcoidosis; don't know exactly which nodes" (07/23/2102)  ? MYOMECTOMY  1994  ?  Open  ? POLYPECTOMY  2020  ? TA's  ? REFRACTIVE SURGERY  08/1998  ? "both eyes" (07/23/2012)  ? REFRACTIVE SURGERY  2000  ? Bil  ? TOTAL KNEE ARTHROPLASTY Left 09/24/2016  ? Procedure: TOTAL KNEE ARTHROPLASTY;  Surgeon: Dorna Leitz, MD;  Location: [facility];  Service: Orthopedics;  Laterality: Left;  ? ? ?Current Medications: ?Current Outpatient Medications on File Prior to Visit  ?Medication Sig Dispense Refill  ? amoxicillin (AMOXIL) 500 MG tablet Prior to dental appointments    ? atenolol (TENORMIN) 50 MG tablet TAKE 1 TABLET(50 MG) BY MOUTH DAILY (Patient taking differently: 25 mg.) 90 tablet 3  ? baclofen (LIORESAL) 10 MG tablet Take 10 mg by mouth 2 (two) times daily as needed.    ? brimonidine-timolol (COMBIGAN) 0.2-0.5 % ophthalmic solution     ?  carboxymethylcellulose (REFRESH PLUS) 0.5 % SOLN 1 drop 3 (three) times daily as needed. RETAIN eye drops instead of refresh    ? clorazepate (TRANXENE) 7.5 MG tablet Take 7.5 mg by mouth 3 (three) times daily.    ? desvenlafaxine (PRISTIQ) 100 MG 24 hr tablet Take 100 mg by mouth at bedtime.    ? diltiazem (DILACOR XR) 240 MG 24 hr capsule Take 1 capsule by mouth daily at 6 (six) AM.    ? Erenumab-aooe (AIMOVIG) 70 MG/ML SOAJ Inject 1 Dose into the skin every 30 (thirty) days.    ? ferrous sulfate 325 (65 FE) MG tablet Take 325 mg by mouth daily with breakfast.    ? furosemide (LASIX) 40 MG tablet Take 1 tablet (40 mg total) by mouth 2 (two) times daily. (Patient taking differently: Take 40 mg by mouth daily.) 30 tablet 0  ? hydrALAZINE (APRESOLINE) 100 MG tablet Take 1 tablet (100 mg total) by mouth every 8 (eight) hours. (Patient taking differently: Take 100 mg by mouth 3 (three) times daily.)    ? Melatonin 5 MG TABS Take 10 mg by mouth at bedtime.    ? Multiple Vitamins-Minerals (MULTIVITAMIN WITH MINERALS) tablet Take 1 tablet by mouth daily.    ? Omega 3 1200 MG CAPS Take 2,400 mg by mouth at bedtime.    ? PREVIDENT 5000 DRY MOUTH 1.1 % GEL dental gel See admin instructions.    ? VITAMIN D PO Take 1 tablet by mouth daily at 6 (six) AM.    ? VYZULTA 0.024 % SOLN Apply 1 drop to eye at bedtime.    ? zaleplon (SONATA) 10 MG capsule Take 10 mg by mouth at bedtime as needed for sleep.    ? ?Current Facility-Administered Medications on File Prior to Visit  ?Medication Dose Route Frequency Provider Last Rate Last Admin  ? 0.9 %  sodium chloride infusion  500 mL Intravenous Once Nandigam, Venia Minks, MD      ? ? ? ?Allergies:   Fluoxetine hcl, Sulfa antibiotics, Chocolate, Floxin [ocuflox], Other, and Sulfonamide derivatives  ? ?Social History  ? ?Socioeconomic History  ? Marital status: Widowed  ?  Spouse name: Not on file  ? Number of children: Not on file  ? Years of education: Not on file  ? Highest education  level: Not on file  ?Occupational History  ? Occupation: Designer, jewellery  ?Tobacco Use  ? Smoking status: Never  ? Smokeless tobacco: Never  ?Vaping Use  ? Vaping Use: Never used  ?Substance and Sexual Activity  ? Alcohol use: Yes  ?  Alcohol/week: 1.0 - 2.0 standard drink  ?  Types: 1 -  2 Standard drinks or equivalent per week  ? Drug use: No  ? Sexual activity: Not Currently  ?  Birth control/protection: Post-menopausal  ?  Comment: 1st intercourse 34 yo-1 partner  ?Other Topics Concern  ? Not on file  ?Social History Narrative  ? Lives with 4 cats  ? ?Social Determinants of Health  ? ?Financial Resource Strain: Not on file  ?Food Insecurity: Not on file  ?Transportation Needs: Not on file  ?Physical Activity: Not on file  ?Stress: Not on file  ?Social Connections: Not on file  ?  ? ?Family History: ?The patient's family history includes Asthma in her sister; Diabetes in her mother; Gout in her sister; Heart disease (age of onset: 42) in her father; Hypertension in her father, mother, and sister; Stroke in her mother. There is no history of Colon cancer, Breast cancer, Esophageal cancer, Stomach cancer, Rectal cancer, or Colon polyps. ? ?ROS:   ?Please see the history of present illness.    ? All other systems reviewed and are negative. ? ?EKGs/Labs/Other Studies Reviewed:   ? ?The following studies were reviewed today: ? ? ?EKG:  EKG is  ordered today.  The ekg ordered today demonstrates  ? ?11/23/2021-Rate controlled atrial fibrillation  ? ?Recent Labs: ?10/10/2021: ALT 38; BUN 30; Creatinine, Ser 1.15; Hemoglobin 14.1; Platelets 187.0; Potassium 3.7; Sodium 140; TSH 2.68  ? ? ?Recent Lipid Panel ?   ?Component Value Date/Time  ? CHOL 110 10/10/2021 1013  ? TRIG 56.0 10/10/2021 1013  ? HDL 56.20 10/10/2021 1013  ? CHOLHDL 2 10/10/2021 1013  ? VLDL 11.2 10/10/2021 1013  ? LDLCALC 43 10/10/2021 1013  ? LDLDIRECT 50.0 09/24/2017 1614  ? ? ? ?Risk Assessment/Calculations:   ? ?CHA2DS2-VASc Score = 3  ? This  indicates a 3.2% annual risk of stroke. ?The patient's score is based upon: ?CHF History: 0 ?HTN History: 1 ?Diabetes History: 0 ?Stroke History: 0 ?Vascular Disease History: 0 ?Age Score: 1 ?Gender Score: 1 ?  ? ? ?    ? ?Physical

## 2021-11-23 NOTE — H&P (View-Only) (Signed)
Cardiology Office Note:    Date:  11/23/2021   ID:  Tara [name], DOB [birth year], MRN [id number]  PCP:  Biagio Borg, MD   [facility] Cardiologist:  Janina Mayo, MD     Referring MD: Biagio Borg, MD   No chief complaint on file. Atrial Fibrillation  History of Present Illness:    Tara [name] is a 71 y.o. female with a hx of CKD, sarcoidosis (+biopsy), referral for paroxysmal atrial fibrillation  She was seen by GI for surveillance colonoscopy. During her colonoscopy noted to be in afib.  She broought in the 12 lead which shows atrial fibrillation.  TSH is normal. She has sleep apnea, difficulty with cpap adherence 2/2 dry eye exacerbation. She has a sleep study pending with pulmonology, sees Dr. Annamaria Boots. She's planned for repeat sleep study. She's being considered for Inspire. Blood pressures are well controlled. She denies significant ETOH. No GI bleeding history. No hx of stroke.  She's felt more tired than usual. She does not sense the palpations. No PND, orthopnea or LE edema.   Cardiology Studies: TTE 12/10/2015: EF normal, LAVi 41 cc/m2   Past Medical History:  Diagnosis Date   Anemia    on meds   ANKLE PAIN, LEFT 04/01/2008   ANXIETY 04/17/2007   on meds   Arthritis    generalized   Cataract    bilateral sx   Chronic kidney disease    stage 3   Colon polyp    COLONIC POLYPS, HX OF 08/01/2007   CONTUSIONS, MULTIPLE 04/01/2009   DEPRESSION 04/17/2007   on meds   DIZZINESS 08/01/2007   DYSPNEA 08/01/2007   with exertion   Enlargement of lymph nodes 08/13/2007   Excessive involuntary blinking    per pt,going on since 2018   Glaucoma    on meds   GLUCOSE INTOLERANCE 08/01/2007   Hypercalcemia due to sarcoidosis 2014   HYPERLIPIDEMIA 08/01/2007   no meds   HYPERSOMNIA 07/28/2008   HYPERTENSION 04/17/2007   Impaired glucose tolerance 03/23/2011   JOINT EFFUSION, LEFT KNEE 06/02/2010   Loose body in knee 04/01/2009   pt not sure?    Metabolic encephalopathy 83/25/4982-64/1583   Migraines    "stopped 3-4 yr ago" (07/23/2012)   Morbid obesity ([place]) 04/20/2007   OTHER DISEASES OF LUNG NOT ELSEWHERE CLASSIFIED 08/01/2007   Pain in joint, lower leg 04/01/2009   PERIPHERAL EDEMA 04/21/2009   Pre-diabetes    Sarcoidosis 09/25/2007   Seasonal allergies    SHOULDER PAIN, LEFT 04/01/2009   Sleep apnea    does not use Cpap    Past Surgical History:  Procedure Laterality Date   BREAST SURGERY     Biopsy benign/bil breaST   CATARACT EXTRACTION     RIGHT EYE   COLONOSCOPY  2020   KN-MAC-suprep(good)-tics/TA's   COMBINED MEDIASTINOSCOPY AND BRONCHOSCOPY  08/2007   COMBINED MEDIASTINOSCOPY AND BRONCHOSCOPY  2009   Dental implant     FRACTURE SURGERY  ?02/1997   "left upper arm; put rod in" (07/23/2012)   GUM SURGERY  2000-?2009   "several ORs; soft tissue graft; took material from roof of mouth" (07/23/2012   HUMERUS SURGERY Left 1998   rod insertion   KNEE ARTHROSCOPY  03/2004; 06/2009   "right; left" Dr. Theda Sers   KNEE SURGERY  2012   ARTHROSCOPIC LEFT KNEE   LYMPH NODE BIOPSY  ~ 2009   "for sarcoidosis; don't know exactly which nodes" (07/23/2102)   [place]  Open   POLYPECTOMY  2020   TA's   REFRACTIVE SURGERY  08/1998   "both eyes" (07/23/2012)   [place]   Bil   TOTAL KNEE ARTHROPLASTY Left 09/24/2016   Procedure: TOTAL KNEE ARTHROPLASTY;  Surgeon: Dorna Leitz, MD;  Location: [facility];  Service: Orthopedics;  Laterality: Left;    Current Medications: Current Outpatient Medications on File Prior to Visit  Medication Sig Dispense Refill   amoxicillin (AMOXIL) 500 MG tablet Prior to dental appointments     atenolol (TENORMIN) 50 MG tablet TAKE 1 TABLET(50 MG) BY MOUTH DAILY (Patient taking differently: 25 mg.) 90 tablet 3   baclofen (LIORESAL) 10 MG tablet Take 10 mg by mouth 2 (two) times daily as needed.     brimonidine-timolol (COMBIGAN) 0.2-0.5 % ophthalmic solution       carboxymethylcellulose (REFRESH PLUS) 0.5 % SOLN 1 drop 3 (three) times daily as needed. RETAIN eye drops instead of refresh     clorazepate (TRANXENE) 7.5 MG tablet Take 7.5 mg by mouth 3 (three) times daily.     desvenlafaxine (PRISTIQ) 100 MG 24 hr tablet Take 100 mg by mouth at bedtime.     diltiazem (DILACOR XR) 240 MG 24 hr capsule Take 1 capsule by mouth daily at 6 (six) AM.     Erenumab-aooe (AIMOVIG) 70 MG/ML SOAJ Inject 1 Dose into the skin every 30 (thirty) days.     ferrous sulfate 325 (65 FE) MG tablet Take 325 mg by mouth daily with breakfast.     furosemide (LASIX) 40 MG tablet Take 1 tablet (40 mg total) by mouth 2 (two) times daily. (Patient taking differently: Take 40 mg by mouth daily.) 30 tablet 0   hydrALAZINE (APRESOLINE) 100 MG tablet Take 1 tablet (100 mg total) by mouth every 8 (eight) hours. (Patient taking differently: Take 100 mg by mouth 3 (three) times daily.)     Melatonin 5 MG TABS Take 10 mg by mouth at bedtime.     Multiple Vitamins-Minerals (MULTIVITAMIN WITH MINERALS) tablet Take 1 tablet by mouth daily.     Omega 3 1200 MG CAPS Take 2,400 mg by mouth at bedtime.     PREVIDENT 5000 DRY MOUTH 1.1 % GEL dental gel See admin instructions.     VITAMIN D PO Take 1 tablet by mouth daily at 6 (six) AM.     VYZULTA 0.024 % SOLN Apply 1 drop to eye at bedtime.     zaleplon (SONATA) 10 MG capsule Take 10 mg by mouth at bedtime as needed for sleep.     Current Facility-Administered Medications on File Prior to Visit  Medication Dose Route Frequency Provider Last Rate Last Admin   0.9 %  sodium chloride infusion  500 mL Intravenous Once Nandigam, Venia Minks, MD         Allergies:   Fluoxetine hcl, Sulfa antibiotics, Chocolate, Floxin [ocuflox], Other, and Sulfonamide derivatives   Social History   Socioeconomic History   Marital status: Widowed    Spouse name: Not on file   Number of children: Not on file   Years of education: Not on file   Highest education  level: Not on file  Occupational History   Occupation: Designer, jewellery  Tobacco Use   Smoking status: Never   Smokeless tobacco: Never  Vaping Use   Vaping Use: Never used  Substance and Sexual Activity   Alcohol use: Yes    Alcohol/week: 1.0 - 2.0 standard drink    Types: 1 -  2 Standard drinks or equivalent per week   Drug use: No   Sexual activity: Not Currently    Birth control/protection: Post-menopausal    Comment: 1st intercourse 17 yo-1 partner  Other Topics Concern   Not on file  Social History Narrative   Lives with 4 cats   Social Determinants of Health   Financial Resource Strain: Not on file  Food Insecurity: Not on file  Transportation Needs: Not on file  Physical Activity: Not on file  Stress: Not on file  Social Connections: Not on file     Family History: The patient's family history includes Asthma in her sister; Diabetes in her mother; Gout in her sister; Heart disease (age of onset: 32) in her father; Hypertension in her father, mother, and sister; Stroke in her mother. There is no history of Colon cancer, Breast cancer, Esophageal cancer, Stomach cancer, Rectal cancer, or Colon polyps.  ROS:   Please see the history of present illness.     All other systems reviewed and are negative.  EKGs/Labs/Other Studies Reviewed:    The following studies were reviewed today:   EKG:  EKG is  ordered today.  The ekg ordered today demonstrates   11/23/2021-Rate controlled atrial fibrillation   Recent Labs: 10/10/2021: ALT 38; BUN 30; Creatinine, Ser 1.15; Hemoglobin 14.1; Platelets 187.0; Potassium 3.7; Sodium 140; TSH 2.68    Recent Lipid Panel    Component Value Date/Time   CHOL 110 10/10/2021 1013   TRIG 56.0 10/10/2021 1013   HDL 56.20 10/10/2021 1013   CHOLHDL 2 10/10/2021 1013   VLDL 11.2 10/10/2021 1013   LDLCALC 43 10/10/2021 1013   LDLDIRECT 50.0 09/24/2017 1614     Risk Assessment/Calculations:    CHA2DS2-VASc Score = 3   This  indicates a 3.2% annual risk of stroke. The patient's score is based upon: CHF History: 0 HTN History: 1 Diabetes History: 0 Stroke History: 0 Vascular Disease History: 0 Age Score: 1 Gender Score: 1          Physical Exam:    VS:    Vitals:   11/23/21 0935  BP: 116/70  Pulse: 65  SpO2: 98%     Wt Readings from Last 3 Encounters:  11/23/21 189 lb 12.8 oz (86.1 kg)  11/22/21 192 lb (87.1 kg)  11/01/21 192 lb (87.1 kg)     GEN:  Well nourished, well developed in no acute distress HEENT: Normal NECK: No JVD; No carotid bruits LYMPHATICS: No lymphadenopathy CARDIAC: irregularly irregular no murmurs, rubs, gallops RESPIRATORY:  Clear to auscultation without rales, wheezing or rhonchi  ABDOMEN: Soft, non-tender, non-distended MUSCULOSKELETAL:  No edema; No deformity  SKIN: Warm and dry NEUROLOGIC:  Alert and oriented x 3 PSYCHIATRIC:  Normal affect   ASSESSMENT:    Paroxysmal Afib: New Onset. CHADS2VASC 3. Normal EF, LA noted to be moderately dilated. Highly encouraged management of her OSA.  will Plan for restoration of sinus rhythm with DCCV after 3 weeks of AC.  -rate controlled -continue diltiazem 240 mg daily and atenolol 25 mg daily ( if rates decline and/or kidney function worsens would consider stopping) - plan for DCCV  HTN- well controlled. Continue current regimen above  CKD Stage III- GFR 48. on lasix 40 mg BID  PLAN:    In order of problems listed above:  Start eliquis 5 mg BID (gave samples today) DCCV in 3 weeks Follow up in 3 months     Shared Decision Making/Informed Consent The risks (stroke, cardiac  arrhythmias rarely resulting in the need for a temporary or permanent pacemaker, skin irritation or burns and complications associated with conscious sedation including aspiration, arrhythmia, respiratory failure and death), benefits (restoration of normal sinus rhythm) and alternatives of a direct current cardioversion were explained in detail  to Tara [name] and she agrees to proceed.      Medication Adjustments/Labs and Tests Ordered: Current medicines are reviewed at length with the patient today.  Concerns regarding medicines are outlined above.  Orders Placed This Encounter  Procedures   Basic metabolic panel   CBC   EKG 12-Lead   Meds ordered this encounter  Medications   apixaban (ELIQUIS) 5 MG TABS tablet    Sig: Take 1 tablet (5 mg total) by mouth 2 (two) times daily.    Dispense:  90 tablet    Refill:  3    Patient Instructions  Medication Instructions:  STOP: Aspirin 81 mg daily START: Eliquis 5 mg twice a day  *If you need a refill on your cardiac medications before your next appointment, please call your pharmacy*   Lab Work: Your physician recommends that you return for lab work 1 week prior to cardioversion ( BMP, CBC)  If you have labs (blood work) drawn today and your tests are completely normal, you will receive your results only by: [facility] (if you have MyChart) OR A paper copy in the mail If you have any lab test that is abnormal or we need to change your treatment, we will call you to review the results.   Testing/Procedures: [facility]   Follow-Up: At [facility], you and your health needs are our priority.  As part of our continuing mission to provide you with exceptional heart care, we have created designated Provider Care Teams.  These Care Teams include your primary Cardiologist (physician) and Advanced Practice Providers (APPs -  Physician Assistants and Nurse Practitioners) who all work together to provide you with the care you need, when you need it.  We recommend signing up for the patient portal called "MyChart".  Sign up information is provided on this After Visit Summary.  MyChart is used to connect with patients for Virtual Visits (Telemedicine).  Patients are able to view lab/test results, encounter notes, upcoming appointments, etc.   Non-urgent messages can be sent to your provider as well.   To learn more about what you can do with MyChart, go to [email or web address].    Your next appointment:   3 month(s)  The format for your next appointment:   In Person  Provider:   Janina Mayo, MD {   You are scheduled for a Cardioversion on Friday 12/15/21 with Dr. Oval Linsey.  Please arrive at the [facility] (Main Entrance A) at [facility]: [address] [place], [place] [address] at 9 am.  DIET: Nothing to eat or drink after midnight except a sip of water with medications (see medication instructions below)  FYI: For your safety, and to allow [place] to monitor your vital signs accurately during the surgery/procedure we request that   if you have artificial nails, gel coating, SNS etc. Please have those removed prior to your surgery/procedure. Not having the nail coverings /polish removed may result in cancellation or delay of your surgery/procedure.   Medication Instructions:  Continue your anticoagulant: Eliquis 5 mg twice a day You will need to continue your anticoagulant after your procedure until you  are told by your  Provider that it is safe to  stop   Labs: 1 week prior to procedure (BMP, CBC)  You must have a responsible person to drive you home and stay in the waiting area during your procedure. Failure to do so could result in cancellation.  Bring your insurance cards.  *Special Note: Every effort is made to have your procedure done on time. Occasionally there are emergencies that occur at the hospital that may cause delays. Please be patient if a delay does occur.    Important Information About Sugar         Signed, Janina Mayo, MD  11/23/2021 10:54 AM    [facility]

## 2021-11-24 ENCOUNTER — Encounter: Payer: Self-pay | Admitting: Gastroenterology

## 2021-11-24 ENCOUNTER — Telehealth: Payer: Self-pay | Admitting: *Deleted

## 2021-11-24 NOTE — Telephone Encounter (Signed)
?  Follow up Call- ? ? ?  11/22/2021  ? 10:58 AM  ?Call back number  ?Post procedure Call Back phone  # [phone]  ?Permission to leave phone message Yes  ?  ? ?Patient questions: ? ?Do you have a fever, pain , or abdominal swelling? No. ?Pain Score  0 * ? ?Have you tolerated food without any problems? Yes.   ? ?Have you been able to return to your normal activities? Yes.   ? ?Do you have any questions about your discharge instructions: ?Diet   No. ?Medications  No. ?Follow up visit  No. ? ?Do you have questions or concerns about your Care? No. ? ?Actions: ?* If pain score is 4 or above: ?No action needed, pain <4. ? ? ?

## 2021-11-29 DIAGNOSIS — J301 Allergic rhinitis due to pollen: Secondary | ICD-10-CM | POA: Diagnosis not present

## 2021-11-29 DIAGNOSIS — J3089 Other allergic rhinitis: Secondary | ICD-10-CM | POA: Diagnosis not present

## 2021-11-29 DIAGNOSIS — R519 Headache, unspecified: Secondary | ICD-10-CM | POA: Diagnosis not present

## 2021-12-05 DIAGNOSIS — H401113 Primary open-angle glaucoma, right eye, severe stage: Secondary | ICD-10-CM | POA: Diagnosis not present

## 2021-12-08 DIAGNOSIS — I480 Paroxysmal atrial fibrillation: Secondary | ICD-10-CM | POA: Diagnosis not present

## 2021-12-09 LAB — BASIC METABOLIC PANEL
BUN/Creatinine Ratio: 25 (ref 12–28)
BUN: 29 mg/dL — ABNORMAL HIGH (ref 8–27)
CO2: 26 mmol/L (ref 20–29)
Calcium: 9.7 mg/dL (ref 8.7–10.3)
Chloride: 101 mmol/L (ref 96–106)
Creatinine, Ser: 1.18 mg/dL — ABNORMAL HIGH (ref 0.57–1.00)
Glucose: 117 mg/dL — ABNORMAL HIGH (ref 70–99)
Potassium: 4.1 mmol/L (ref 3.5–5.2)
Sodium: 142 mmol/L (ref 134–144)
eGFR: 50 mL/min/{1.73_m2} — ABNORMAL LOW (ref >59)

## 2021-12-09 LAB — CBC
Hematocrit: 41.8 % (ref 34.0–46.6)
Hemoglobin: 13.7 g/dL (ref 11.1–15.9)
MCH: 28 pg (ref 26.6–33.0)
MCHC: 32.8 g/dL (ref 31.5–35.7)
MCV: 85 fL (ref 79–97)
Platelets: 213 10*3/uL (ref 150–450)
RBC: 4.9 x10E6/uL (ref 3.77–5.28)
RDW: 15 % (ref 11.7–15.4)
WBC: 4.8 10*3/uL (ref 3.4–10.8)

## 2021-12-13 ENCOUNTER — Ambulatory Visit: Payer: BC Managed Care – PPO

## 2021-12-13 DIAGNOSIS — G4733 Obstructive sleep apnea (adult) (pediatric): Secondary | ICD-10-CM

## 2021-12-15 ENCOUNTER — Encounter (HOSPITAL_COMMUNITY): Payer: Self-pay | Admitting: Cardiovascular Disease

## 2021-12-15 ENCOUNTER — Other Ambulatory Visit: Payer: Self-pay

## 2021-12-15 ENCOUNTER — Ambulatory Visit (HOSPITAL_COMMUNITY)
Admission: RE | Admit: 2021-12-15 | Discharge: 2021-12-15 | Disposition: A | Discharge status: Home / Self Care | Payer: BC Managed Care – PPO | Attending: Cardiovascular Disease | Admitting: Cardiovascular Disease

## 2021-12-15 ENCOUNTER — Ambulatory Visit (HOSPITAL_COMMUNITY): Payer: BC Managed Care – PPO | Admitting: Anesthesiology

## 2021-12-15 ENCOUNTER — Encounter (HOSPITAL_COMMUNITY)
Admission: RE | Disposition: A | Discharge status: Home / Self Care | Payer: Self-pay | Source: Home / Self Care | Attending: Cardiovascular Disease

## 2021-12-15 DIAGNOSIS — G4733 Obstructive sleep apnea (adult) (pediatric): Secondary | ICD-10-CM | POA: Diagnosis not present

## 2021-12-15 DIAGNOSIS — I4819 Other persistent atrial fibrillation: Secondary | ICD-10-CM | POA: Diagnosis not present

## 2021-12-15 DIAGNOSIS — Z7901 Long term (current) use of anticoagulants: Secondary | ICD-10-CM | POA: Insufficient documentation

## 2021-12-15 DIAGNOSIS — N183 Chronic kidney disease, stage 3 unspecified: Secondary | ICD-10-CM | POA: Insufficient documentation

## 2021-12-15 DIAGNOSIS — I4891 Unspecified atrial fibrillation: Secondary | ICD-10-CM | POA: Diagnosis not present

## 2021-12-15 DIAGNOSIS — D869 Sarcoidosis, unspecified: Secondary | ICD-10-CM | POA: Diagnosis not present

## 2021-12-15 DIAGNOSIS — I129 Hypertensive chronic kidney disease with stage 1 through stage 4 chronic kidney disease, or unspecified chronic kidney disease: Secondary | ICD-10-CM | POA: Diagnosis not present

## 2021-12-15 DIAGNOSIS — F418 Other specified anxiety disorders: Secondary | ICD-10-CM | POA: Diagnosis not present

## 2021-12-15 DIAGNOSIS — I10 Essential (primary) hypertension: Secondary | ICD-10-CM | POA: Diagnosis not present

## 2021-12-15 DIAGNOSIS — G4730 Sleep apnea, unspecified: Secondary | ICD-10-CM | POA: Diagnosis not present

## 2021-12-15 HISTORY — PX: CARDIOVERSION: SHX1299

## 2021-12-15 SURGERY — CARDIOVERSION
Anesthesia: General

## 2021-12-15 MED ORDER — SODIUM CHLORIDE 0.9 % IV SOLN
INTRAVENOUS | Status: AC | PRN
Start: 2021-12-15 — End: 2021-12-15
  Administered 2021-12-15: 500 mL via INTRAVENOUS

## 2021-12-15 MED ORDER — PROPOFOL 10 MG/ML IV BOLUS
INTRAVENOUS | Status: DC | PRN
  Administered 2021-12-15: 60 mg via INTRAVENOUS

## 2021-12-15 MED ORDER — LIDOCAINE 2% (20 MG/ML) 5 ML SYRINGE
INTRAMUSCULAR | Status: DC | PRN
  Administered 2021-12-15: 60 mg via INTRAVENOUS

## 2021-12-15 NOTE — Discharge Instructions (Signed)

## 2021-12-15 NOTE — Interval H&P Note (Signed)
History and Physical Interval Note:  12/15/2021 9:33 AM  Tara [name]  has presented today for surgery, with the diagnosis of atrial fibrillation.  The various methods of treatment have been discussed with the patient and family. After consideration of risks, benefits and other options for treatment, the patient has consented to  Procedure(s): CARDIOVERSION (N/A) as a surgical intervention.  The patient's history has been reviewed, patient examined, no change in status, stable for surgery.  I have reviewed the patient's chart and labs.  Questions were answered to the patient's satisfaction.     Skeet Latch, MD

## 2021-12-15 NOTE — Transfer of Care (Signed)
Immediate Anesthesia Transfer of Care Note  Patient: Tara [name]  Procedure(s) Performed: CARDIOVERSION  Patient Location: PACU and Endoscopy Unit  Anesthesia Type:General  Level of Consciousness: drowsy  Airway & Oxygen Therapy: Patient Spontanous Breathing  Post-op Assessment: Report given to RN and Post -op Vital signs reviewed and stable  Post vital signs: Reviewed and stable  Last Vitals:  Vitals Value Taken Time  BP    Temp    Pulse    Resp    SpO2      Last Pain:  Vitals:   12/15/21 0903  TempSrc: Oral  PainSc: 0-No pain         Complications: No notable events documented.

## 2021-12-15 NOTE — CV Procedure (Signed)
Electrical Cardioversion Procedure Note Sakia Schrimpf [id number] 1951-04-09  Procedure: Electrical Cardioversion Indications:  Atrial Fibrillation  Procedure Details Consent: Risks of procedure as well as the alternatives and risks of each were explained to the (patient/caregiver).  Consent for procedure obtained. Time Out: Verified patient identification, verified procedure, site/side was marked, verified correct patient position, special equipment/implants available, medications/allergies/relevent history reviewed, required imaging and test results available.  Performed  Patient placed on cardiac monitor, pulse oximetry, supplemental oxygen as necessary.  Sedation given:  propofol Pacer pads placed anterior and posterior chest.  Cardioverted 2 time(s).  Cardioverted at unsuccessful.  After the second attempt there were several long pauses up to 5.8 seconds and she never had a sinus beat.  Evaluation Findings: Post procedure EKG shows: Atrial Fibrillation Complications: None Patient did tolerate procedure well.   Skeet Latch, MD 12/15/2021, 9:38 AM

## 2021-12-15 NOTE — Anesthesia Preprocedure Evaluation (Addendum)
Anesthesia Evaluation  Patient identified by MRN, date of birth, ID band Patient awake    Reviewed: Allergy & Precautions, NPO status , Patient's Chart, lab work & pertinent test results  Airway Mallampati: II  TM Distance: >3 FB Neck ROM: Full    Dental no notable dental hx.    Pulmonary sleep apnea ,    Pulmonary exam normal breath sounds clear to auscultation       Cardiovascular Exercise Tolerance: Good hypertension, Normal cardiovascular exam+ dysrhythmias (on eliquis) Atrial Fibrillation  Rhythm:Regular Rate:Normal     Neuro/Psych  Headaches, PSYCHIATRIC DISORDERS Anxiety Depression    GI/Hepatic negative GI ROS, Neg liver ROS,   Endo/Other  diabetesprediabetes  Renal/GU negative Renal ROS  negative genitourinary   Musculoskeletal  (+) Arthritis ,   Abdominal   Peds negative pediatric ROS (+)  Hematology negative hematology ROS (+)   Anesthesia Other Findings   Reproductive/Obstetrics negative OB ROS                           Anesthesia Physical Anesthesia Plan  ASA: 3  Anesthesia Plan: General   Post-op Pain Management: Minimal or no pain anticipated   Induction: Intravenous  PONV Risk Score and Plan: 3 and Treatment may vary due to age or medical condition  Airway Management Planned: Mask  Additional Equipment: None  Intra-op Plan:   Post-operative Plan:   Informed Consent: I have reviewed the patients History and Physical, chart, labs and discussed the procedure including the risks, benefits and alternatives for the proposed anesthesia with the patient or authorized representative who has indicated his/her understanding and acceptance.       Plan Discussed with: Anesthesiologist and CRNA  Anesthesia Plan Comments:        Anesthesia Quick Evaluation

## 2021-12-15 NOTE — Anesthesia Postprocedure Evaluation (Signed)
Anesthesia Post Note  Patient: Tara [name]  Procedure(s) Performed: CARDIOVERSION     Patient location during evaluation: PACU Anesthesia Type: General Level of consciousness: awake Pain management: pain level controlled Vital Signs Assessment: post-procedure vital signs reviewed and stable Respiratory status: spontaneous breathing and respiratory function stable Cardiovascular status: stable Postop Assessment: no apparent nausea or vomiting Anesthetic complications: no   No notable events documented.  Last Vitals:  Vitals:   12/15/21 0953 12/15/21 1003  BP: 138/74 (!) 150/73  Pulse: 62 61  Resp: 17 15  Temp: 36.5 C   SpO2: 96% 98%    Last Pain:  Vitals:   12/15/21 1003  TempSrc:   PainSc: 0-No pain                 Merlinda Frederick

## 2021-12-18 ENCOUNTER — Encounter (HOSPITAL_COMMUNITY): Payer: Self-pay | Admitting: Cardiovascular Disease

## 2021-12-20 ENCOUNTER — Telehealth: Payer: Self-pay

## 2021-12-20 NOTE — Telephone Encounter (Signed)
-----   Message from Janina Mayo, MD sent at 12/15/2021  1:31 PM EDT ----- Regarding: Afib Clinic [facility] Eliezer Lofts,  Please schedule Mrs Predmore with Afib clinic for initiation of an antiarrhythmic agent. Her cardioversion was not successful.  I thinks she's a good flecainide candidate.   Thank you!  ----- Message ----- From: Skeet Latch, MD Sent: 12/15/2021   9:43 AM EDT To: Janina Mayo, MD  [place].  I cardioverted her twice and she didn't go back into rhythm.  We used 200J then 200J with pressure applied to her chest.  After the second attempt she had long pauses up to 5.8 s and still not a sinus beat in sight, so I didn't try again.  She is going to need antiarrhythmic.  ~Tiffany

## 2021-12-20 NOTE — Telephone Encounter (Signed)
Patient is scheduled to see Atrial Fib clinic on 12/25/21 at 9:30am.

## 2021-12-25 ENCOUNTER — Encounter (HOSPITAL_COMMUNITY): Payer: Self-pay | Admitting: Physician Assistant

## 2021-12-25 ENCOUNTER — Ambulatory Visit (HOSPITAL_COMMUNITY)
Admission: RE | Admit: 2021-12-25 | Discharge: 2021-12-25 | Disposition: A | Discharge status: Home / Self Care | Payer: BC Managed Care – PPO | Source: Ambulatory Visit | Attending: Physician Assistant | Admitting: Physician Assistant

## 2021-12-25 VITALS — BP 114/70 | HR 65 | Ht 66.0 in | Wt 194.6 lb

## 2021-12-25 DIAGNOSIS — D869 Sarcoidosis, unspecified: Secondary | ICD-10-CM | POA: Diagnosis not present

## 2021-12-25 DIAGNOSIS — I4819 Other persistent atrial fibrillation: Secondary | ICD-10-CM | POA: Diagnosis not present

## 2021-12-25 DIAGNOSIS — G4733 Obstructive sleep apnea (adult) (pediatric): Secondary | ICD-10-CM | POA: Diagnosis not present

## 2021-12-25 DIAGNOSIS — Z7901 Long term (current) use of anticoagulants: Secondary | ICD-10-CM | POA: Insufficient documentation

## 2021-12-25 DIAGNOSIS — N189 Chronic kidney disease, unspecified: Secondary | ICD-10-CM | POA: Diagnosis not present

## 2021-12-25 DIAGNOSIS — Z6831 Body mass index (BMI) 31.0-31.9, adult: Secondary | ICD-10-CM | POA: Diagnosis not present

## 2021-12-25 DIAGNOSIS — D6869 Other thrombophilia: Secondary | ICD-10-CM | POA: Insufficient documentation

## 2021-12-25 DIAGNOSIS — I129 Hypertensive chronic kidney disease with stage 1 through stage 4 chronic kidney disease, or unspecified chronic kidney disease: Secondary | ICD-10-CM | POA: Insufficient documentation

## 2021-12-25 DIAGNOSIS — E669 Obesity, unspecified: Secondary | ICD-10-CM | POA: Insufficient documentation

## 2021-12-25 NOTE — Progress Notes (Signed)
Primary Care Physician: Biagio Borg, MD Primary Cardiologist: Dr Phineas Inches Primary Electrophysiologist: none Referring Physician: Dr Sheliah Hatch is a 71 y.o. female with a history of CKD, sarcoidosis, HTN, OSA, atrial fibrillation who presents for consultation in the [facility].  She was seen by GI for surveillance colonoscopy and during her colonoscopy noted to be in afib. She admits she had been more tired than usual and had some intermittent lightheadedness. Patient is on Eliquis for a CHADS2VASC score of 3. She underwent DCCV on 12/15/21 which was unsuccessful. She denies significant alcohol use.  Today, she denies symptoms of palpitations, chest pain, shortness of breath, orthopnea, PND, lower extremity edema, presyncope, syncope, bleeding, or neurologic sequela. The patient is tolerating medications without difficulties and is otherwise without complaint today.    Atrial Fibrillation Risk Factors:  she does have symptoms or diagnosis of sleep apnea. she is not compliant with CPAP therapy. she does not have a history of rheumatic fever. she does not have a history of alcohol use. The patient does have a history of early familial atrial fibrillation or other arrhythmias. Father has afib.  she has a BMI of Body mass index is 31.41 kg/m.Marland Kitchen Filed Weights   12/25/21 0957  Weight: 88.3 kg    Family History  Problem Relation Age of Onset   Diabetes Mother    Hypertension Mother    Stroke Mother    Heart disease Father 92   Hypertension Father    Hypertension Sister    Asthma Sister    Gout Sister    Colon cancer Neg Hx    Breast cancer Neg Hx    Esophageal cancer Neg Hx    Stomach cancer Neg Hx    Rectal cancer Neg Hx    Colon polyps Neg Hx      Atrial Fibrillation Management history:  Previous antiarrhythmic drugs: none Previous cardioversions: 12/15/21 Previous ablations: none CHADS2VASC score: 3 Anticoagulation history:  Eliquis   Past Medical History:  Diagnosis Date   Anemia    on meds   ANKLE PAIN, LEFT 04/01/2008   ANXIETY 04/17/2007   on meds   Arthritis    generalized   Cataract    bilateral sx   Chronic kidney disease    stage 3   Colon polyp    COLONIC POLYPS, HX OF 08/01/2007   CONTUSIONS, MULTIPLE 04/01/2009   DEPRESSION 04/17/2007   on meds   DIZZINESS 08/01/2007   DYSPNEA 08/01/2007   with exertion   Enlargement of lymph nodes 08/13/2007   Excessive involuntary blinking    per pt,going on since 2018   Glaucoma    on meds   GLUCOSE INTOLERANCE 08/01/2007   Hypercalcemia due to sarcoidosis 2014   HYPERLIPIDEMIA 08/01/2007   no meds   HYPERSOMNIA 07/28/2008   HYPERTENSION 04/17/2007   Impaired glucose tolerance 03/23/2011   JOINT EFFUSION, LEFT KNEE 06/02/2010   Loose body in knee 04/01/2009   pt not sure?   Metabolic encephalopathy 56/38/7564-33/2951   Migraines    "stopped 3-4 yr ago" (07/23/2012)   Morbid obesity ([place]) 04/20/2007   OTHER DISEASES OF LUNG NOT ELSEWHERE CLASSIFIED 08/01/2007   Pain in joint, lower leg 04/01/2009   PERIPHERAL EDEMA 04/21/2009   Pre-diabetes    Sarcoidosis 09/25/2007   Seasonal allergies    SHOULDER PAIN, LEFT 04/01/2009   Sleep apnea    does not use Cpap   Past Surgical History:  Procedure Laterality Date  BREAST SURGERY     Biopsy benign/bil breaST   CARDIOVERSION N/A 12/15/2021   Procedure: CARDIOVERSION;  Surgeon: Skeet Latch, MD;  Location: [facility];  Service: Cardiovascular;  Laterality: N/A;   CATARACT EXTRACTION     RIGHT EYE   COLONOSCOPY  2020   KN-MAC-suprep(good)-tics/TA's   COMBINED MEDIASTINOSCOPY AND BRONCHOSCOPY  08/2007   COMBINED MEDIASTINOSCOPY AND BRONCHOSCOPY  2009   Dental implant     FRACTURE SURGERY  ?02/1997   "left upper arm; put rod in" (07/23/2012)   GUM SURGERY  2000-?2009   "several ORs; soft tissue graft; took material from roof of mouth" (07/23/2012   HUMERUS SURGERY Left 1998   rod  insertion   KNEE ARTHROSCOPY  03/2004; 06/2009   "right; left" Dr. Theda Sers   KNEE SURGERY  2012   ARTHROSCOPIC LEFT KNEE   LYMPH NODE BIOPSY  ~ 2009   "for sarcoidosis; don't know exactly which nodes" (07/23/2102)   [place]   Open   POLYPECTOMY  2020   TA's   REFRACTIVE SURGERY  08/1998   "both eyes" (07/23/2012)   [place]   Bil   TOTAL KNEE ARTHROPLASTY Left 09/24/2016   Procedure: TOTAL KNEE ARTHROPLASTY;  Surgeon: Dorna Leitz, MD;  Location: [facility];  Service: Orthopedics;  Laterality: Left;    Current Outpatient Medications  Medication Sig Dispense Refill   amoxicillin (AMOXIL) 500 MG tablet Prior to dental appointments     apixaban (ELIQUIS) 5 MG TABS tablet Take 1 tablet (5 mg total) by mouth 2 (two) times daily. 90 tablet 3   atenolol (TENORMIN) 50 MG tablet TAKE 1 TABLET(50 MG) BY MOUTH DAILY (Patient taking differently: Take 25 mg by mouth daily.) 90 tablet 3   baclofen (LIORESAL) 10 MG tablet Take 10 mg by mouth 2 (two) times daily as needed.     brimonidine-timolol (COMBIGAN) 0.2-0.5 % ophthalmic solution Place 1 drop into both eyes every 12 (twelve) hours.     carboxymethylcellulose (REFRESH PLUS) 0.5 % SOLN 1 drop 3 (three) times daily as needed. RETAIN eye drops instead of refresh     cholecalciferol (VITAMIN D) 25 MCG (1000 UNIT) tablet Take 1,000 Units by mouth daily.     clorazepate (TRANXENE) 7.5 MG tablet Take 7.5 mg by mouth 2 (two) times daily.     desvenlafaxine (PRISTIQ) 100 MG 24 hr tablet Take 100 mg by mouth at bedtime.     diltiazem (CARDIZEM CD) 240 MG 24 hr capsule Take 240 mg by mouth daily.     Erenumab-aooe (AIMOVIG) 70 MG/ML SOAJ Inject 1 Dose into the skin every 30 (thirty) days.     ferrous sulfate 325 (65 FE) MG tablet Take 325 mg by mouth daily with breakfast.     furosemide (LASIX) 40 MG tablet Take 1 tablet (40 mg total) by mouth 2 (two) times daily. (Patient taking differently: Take 40 mg by mouth daily.) 30 tablet 0    hydrALAZINE (APRESOLINE) 100 MG tablet Take 1 tablet (100 mg total) by mouth every 8 (eight) hours.     ketorolac (ACULAR) 0.4 % SOLN Place 1 drop into the right eye daily.     Melatonin 5 MG TABS Take 10 mg by mouth at bedtime.     Multiple Vitamins-Minerals (MULTIVITAMIN WITH MINERALS) tablet Take 1 tablet by mouth daily.     Omega 3 1200 MG CAPS Take 2,400 mg by mouth at bedtime.     PREVIDENT 5000 DRY MOUTH 1.1 % GEL dental gel  Place 1 application. onto teeth daily.     VYZULTA 0.024 % SOLN Place 1 drop into both eyes at bedtime.     zaleplon (SONATA) 10 MG capsule Take 10 mg by mouth at bedtime.     Current Facility-Administered Medications  Medication Dose Route Frequency Provider Last Rate Last Admin   0.9 %  sodium chloride infusion  500 mL Intravenous Once Nandigam, Venia Minks, MD        Allergies  Allergen Reactions   Fluoxetine Hcl Other (See Comments)    (PROZAC) Suicidal thoughts   Sulfa Antibiotics Rash   Chocolate Other (See Comments)    SOMETIMES CAUSES SEVERE HEADACHES   Floxin [Ocuflox] Anxiety    shaky   Other Nausea And Vomiting    INSTANT ICED TEA PACKETS   Sulfonamide Derivatives Rash    Social History   Socioeconomic History   Marital status: Widowed    Spouse name: Not on file   Number of children: Not on file   Years of education: Not on file   Highest education level: Not on file  Occupational History   Occupation: Designer, jewellery  Tobacco Use   Smoking status: Never   Smokeless tobacco: Never   Tobacco comments:    Never smoke 12/25/21  Vaping Use   Vaping Use: Never used  Substance and Sexual Activity   Alcohol use: Yes    Alcohol/week: 1.0 - 2.0 standard drink of alcohol    Types: 1 - 2 Standard drinks or equivalent per week   Drug use: No   Sexual activity: Not Currently    Birth control/protection: Post-menopausal    Comment: 1st intercourse 74 yo-1 partner  Other Topics Concern   Not on file  Social History Narrative   Lives  with 4 cats   Social Determinants of Health   Financial Resource Strain: Not on file  Food Insecurity: Not on file  Transportation Needs: Not on file  Physical Activity: Not on file  Stress: Not on file  Social Connections: Not on file  Intimate Partner Violence: Not on file     ROS- All systems are reviewed and negative except as per the HPI above.  Physical Exam: Vitals:   12/25/21 0957  BP: 114/70  Pulse: 65  Weight: 88.3 kg  Height: _0  (1.676 m)    GEN- The patient is a well appearing obese female, alert and oriented x 3 today.   Head- normocephalic, atraumatic Eyes-  Sclera clear, conjunctiva pink Ears- hearing intact Oropharynx- clear Neck- supple  Lungs- Clear to ausculation bilaterally, normal work of breathing Heart- irregular rate and rhythm, no murmurs, rubs or gallops  GI- soft, NT, ND, + BS Extremities- no clubbing, cyanosis, or edema MS- no significant deformity or atrophy Skin- no rash or lesion Psych- euthymic mood, full affect Neuro- strength and sensation are intact  Wt Readings from Last 3 Encounters:  12/25/21 88.3 kg  12/15/21 87.1 kg  11/23/21 86.1 kg    EKG today demonstrates  Afib Vent. rate 65 BPM PR interval * ms QRS duration 88 ms QT/QTcB 390/405 ms  Echo 12/10/15 demonstrated  Left ventricle: The cavity size was normal. Wall thickness was    increased in a pattern of mild LVH. Systolic function was normal.    The estimated ejection fraction was in the range of 60% to 65%.    Wall motion was normal; there were no regional wall motion    abnormalities. Left ventricular diastolic function parameters  were normal.  - Left atrium: The atrium was moderately dilated.  - Atrial septum: No defect or patent foramen ovale was identified.  Epic records are reviewed at length today  CHA2DS2-VASc Score = 3  The patient's score is based upon: CHF History: 0 HTN History: 1 Diabetes History: 0 Stroke History: 0 Vascular Disease  History: 0 Age Score: 1 Gender Score: 1       ASSESSMENT AND PLAN: 1. Persistent Atrial Fibrillation (ICD10:  I48.19) The patient's CHA2DS2-VASc score is 3, indicating a 3.2% annual risk of stroke.   S/p unsuccessful DCCV on 12/15/21 We discussed rhythm control options today.  Will check echocardiogram to evaluate LA size and EF. If no structural disease, will plan of starting flecainide and repeat DCCV. She will need TST for flecainide after returning to SR. Continue diltiazem 240 mg daily Continue atenolol 25 mg daily  Continue Eliquis 5 mg BID  2. Secondary Hypercoagulable State (ICD10:  D68.69) The patient is at significant risk for stroke/thromboembolism based upon her CHA2DS2-VASc Score of 3.  Continue Apixaban (Eliquis).   3. Obesity Body mass index is 31.41 kg/m. Lifestyle modification was discussed at length including regular exercise and weight reduction.  4. Obstructive sleep apnea The importance of adequate treatment of sleep apnea was discussed today in order to improve our ability to maintain sinus rhythm long term.  5. HTN Stable, no changes today.   Follow up in the AF clinic 01/18/22.    [facility] [address] [place], [place] [address] [phone] 12/25/2021 10:03 AM

## 2022-01-02 ENCOUNTER — Other Ambulatory Visit (HOSPITAL_COMMUNITY): Payer: BC Managed Care – PPO

## 2022-01-02 DIAGNOSIS — H401122 Primary open-angle glaucoma, left eye, moderate stage: Secondary | ICD-10-CM | POA: Diagnosis not present

## 2022-01-08 ENCOUNTER — Encounter (HOSPITAL_COMMUNITY): Payer: BC Managed Care – PPO | Admitting: Physician Assistant

## 2022-01-11 ENCOUNTER — Ambulatory Visit (HOSPITAL_COMMUNITY)
Admission: RE | Admit: 2022-01-11 | Discharge: 2022-01-11 | Disposition: A | Discharge status: Home / Self Care | Payer: BC Managed Care – PPO | Source: Ambulatory Visit | Attending: Physician Assistant | Admitting: Physician Assistant

## 2022-01-11 DIAGNOSIS — I4819 Other persistent atrial fibrillation: Secondary | ICD-10-CM | POA: Diagnosis not present

## 2022-01-11 DIAGNOSIS — I082 Rheumatic disorders of both aortic and tricuspid valves: Secondary | ICD-10-CM | POA: Insufficient documentation

## 2022-01-11 LAB — ECHOCARDIOGRAM COMPLETE
AR max vel: 1.83 cm2
AV Area VTI: 1.7 cm2
AV Area mean vel: 1.73 cm2
AV Mean grad: 3 mmHg
AV Peak grad: 6.3 mmHg
Ao pk vel: 1.25 m/s
S' Lateral: 2.9 cm

## 2022-01-11 NOTE — Progress Notes (Signed)
  Echocardiogram 2D Echocardiogram has been performed.  Merrie Roof F 01/11/2022, 4:05 PM

## 2022-01-18 ENCOUNTER — Ambulatory Visit (HOSPITAL_COMMUNITY)
Admission: RE | Admit: 2022-01-18 | Discharge: 2022-01-18 | Disposition: A | Discharge status: Home / Self Care | Payer: BC Managed Care – PPO | Source: Ambulatory Visit | Attending: Physician Assistant | Admitting: Physician Assistant

## 2022-01-18 DIAGNOSIS — D6869 Other thrombophilia: Secondary | ICD-10-CM

## 2022-01-18 DIAGNOSIS — I4819 Other persistent atrial fibrillation: Secondary | ICD-10-CM | POA: Diagnosis not present

## 2022-01-18 MED ORDER — FLECAINIDE ACETATE 50 MG PO TABS: 50.0000 mg | ORAL_TABLET | Freq: Two times a day (BID) | ORAL | 3 refills | Status: DC

## 2022-01-18 NOTE — Progress Notes (Signed)
ECG today shows rate controlled afib, patient still with symptoms of SOB and fatigue with exercise. Echo showed normal EF 55-60%, no LVH. Will start flecainide 50 mg BID. If she does not chemically convert, will increase dose and plan for repeat DCCV. F/u in AF clinic next week.

## 2022-01-18 NOTE — Patient Instructions (Addendum)
Start Flecainide '50mg'$  twice a day  Cardioversion scheduled for Wednesday, July 19th  - Arrive at the [facility] and go to admitting at [facility] not eat or drink anything after midnight the night prior to your procedure.  - Take all your morning medication (except diabetic medications) with a sip of water prior to arrival.  - You will not be able to drive home after your procedure.  - Do NOT miss any doses of your blood thinner - if you should miss a dose please notify our office immediately.  - If you feel as if you go back into normal rhythm prior to scheduled cardioversion, please notify our office immediately. If your procedure is canceled in the cardioversion suite you will be charged a cancellation fee.

## 2022-01-18 NOTE — H&P (View-Only) (Signed)
ECG today shows rate controlled afib, patient still with symptoms of SOB and fatigue with exercise. Echo showed normal EF 55-60%, no LVH. Will start flecainide 50 mg BID. If she does not chemically convert, will increase dose and plan for repeat DCCV. F/u in AF clinic next week.

## 2022-01-19 ENCOUNTER — Ambulatory Visit (INDEPENDENT_AMBULATORY_CARE_PROVIDER_SITE_OTHER): Payer: BC Managed Care – PPO | Admitting: Podiatry

## 2022-01-19 ENCOUNTER — Encounter: Payer: Self-pay | Admitting: Podiatry

## 2022-01-19 DIAGNOSIS — E1151 Type 2 diabetes mellitus with diabetic peripheral angiopathy without gangrene: Secondary | ICD-10-CM | POA: Diagnosis not present

## 2022-01-19 DIAGNOSIS — N179 Acute kidney failure, unspecified: Secondary | ICD-10-CM

## 2022-01-19 DIAGNOSIS — B351 Tinea unguium: Secondary | ICD-10-CM

## 2022-01-19 DIAGNOSIS — N189 Chronic kidney disease, unspecified: Secondary | ICD-10-CM | POA: Diagnosis not present

## 2022-01-19 DIAGNOSIS — E1169 Type 2 diabetes mellitus with other specified complication: Secondary | ICD-10-CM

## 2022-01-19 NOTE — Progress Notes (Signed)
This patient returns to my office for at risk foot care.  This patient requires this care by a professional since this patient will be at risk due to having diabetes and kidney disease. This patient is unable to cut nails herself since the patient cannot reach her nails.These nails are painful walking and wearing shoes.  This patient presents for at risk foot care today.  General Appearance  Alert, conversant and in no acute stress.  Vascular  Dorsalis pedis and pulses are palpable  bilaterally.  Posterior tibial pulses are weakly palpable. Capillary return is within normal limits  bilaterally. Temperature is within normal limits  bilaterally.  Neurologic  Senn-Weinstein monofilament wire test within normal limits  bilaterally. Muscle power within normal limits bilaterally.  Nails Thick disfigured discolored nails with subungual debris  from hallux to fifth toes bilaterally. No evidence of bacterial infection or drainage bilaterally.  Orthopedic  No limitations of motion  feet .  No crepitus or effusions noted.  Mild  hallux Varus  B/L.  Hammer toes 2-4  B/L.  Skin  normotropic skin with no porokeratosis noted bilaterally.  No signs of infections or ulcers noted.   Asymptomatic callus sub 5th met left foot.  Onychomycosis  Pain in right toes  Pain in left toes  Consent was obtained for treatment procedures.   Mechanical debridement of nails 1-5  bilaterally performed with a nail nipper.  Filed with dremel without incident.    Return office visit    4 months                  Told patient to return for periodic foot care and evaluation due to potential at risk complications.   Gardiner Barefoot DPM

## 2022-01-24 ENCOUNTER — Encounter (HOSPITAL_COMMUNITY): Payer: Self-pay | Admitting: Cardiology

## 2022-01-24 NOTE — Progress Notes (Signed)
Attempted to obtain medical history via telephone, unable to reach at this time. Unable to leave voicemail to return pre surgical testing department's phone call,due to mailbox full.  

## 2022-01-25 ENCOUNTER — Ambulatory Visit (HOSPITAL_COMMUNITY)
Admission: RE | Admit: 2022-01-25 | Discharge: 2022-01-25 | Disposition: A | Discharge status: Home / Self Care | Payer: BC Managed Care – PPO | Source: Ambulatory Visit | Attending: Physician Assistant | Admitting: Physician Assistant

## 2022-01-25 VITALS — HR 73

## 2022-01-25 DIAGNOSIS — I4819 Other persistent atrial fibrillation: Secondary | ICD-10-CM | POA: Diagnosis not present

## 2022-01-25 LAB — CBC
HCT: 44.5 % (ref 36.0–46.0)
Hemoglobin: 14.6 g/dL (ref 12.0–15.0)
MCH: 27.9 pg (ref 26.0–34.0)
MCHC: 32.8 g/dL (ref 30.0–36.0)
MCV: 84.9 fL (ref 80.0–100.0)
Platelets: 218 10*3/uL (ref 150–400)
RBC: 5.24 MIL/uL — ABNORMAL HIGH (ref 3.87–5.11)
RDW: 14.2 % (ref 11.5–15.5)
WBC: 5.8 10*3/uL (ref 4.0–10.5)
nRBC: 0 % (ref 0.0–0.2)

## 2022-01-25 LAB — BASIC METABOLIC PANEL
Anion gap: 9 (ref 5–15)
BUN: 23 mg/dL (ref 8–23)
CO2: 29 mmol/L (ref 22–32)
Calcium: 9.7 mg/dL (ref 8.9–10.3)
Chloride: 102 mmol/L (ref 98–111)
Creatinine, Ser: 1.16 mg/dL — ABNORMAL HIGH (ref 0.44–1.00)
GFR, Estimated: 51 mL/min — ABNORMAL LOW (ref >60)
Glucose, Bld: 107 mg/dL — ABNORMAL HIGH (ref 70–99)
Potassium: 4.1 mmol/L (ref 3.5–5.1)
Sodium: 140 mmol/L (ref 135–145)

## 2022-01-25 NOTE — Progress Notes (Signed)
Patient returns for labs and ECG pre DCCV. ECG shows rate controlled afib HR 73, QRS 90, QTc 451. Patient did note a little dizziness after starting flecainide. Will increase dose to 100 mg BID and proceed with DCCV as scheduled. Instructed patient to call clinic if dizziness worsens on higher dose. F/u with Dr Harl Bowie as scheduled. AF clinic in 3 months.

## 2022-01-25 NOTE — Patient Instructions (Signed)
Cardioversion scheduled for Wednesday, July 19th             - Arrive at the [facility] and go to admitting at [facility] not eat or drink anything after midnight the night prior to your procedure.             - Take all your morning medication (except diabetic medications) with a sip of water prior to arrival.             - You will not be able to drive home after your procedure.             - Do NOT miss any doses of your blood thinner - if you should miss a dose please notify our office immediately.             - If you feel as if you go back into normal rhythm prior to scheduled cardioversion, please notify our office immediately. If your procedure is canceled in the cardioversion suite you will be charged a cancellation fee.

## 2022-01-30 ENCOUNTER — Encounter (HOSPITAL_COMMUNITY): Payer: Self-pay | Admitting: Anesthesiology

## 2022-01-31 ENCOUNTER — Encounter (HOSPITAL_COMMUNITY)
Admission: RE | Disposition: A | Discharge status: Home / Self Care | Payer: Self-pay | Source: Home / Self Care | Attending: Cardiology

## 2022-01-31 ENCOUNTER — Ambulatory Visit (HOSPITAL_COMMUNITY)
Admission: RE | Admit: 2022-01-31 | Discharge: 2022-01-31 | Disposition: A | Discharge status: Home / Self Care | Payer: BC Managed Care – PPO | Attending: Cardiology | Admitting: Cardiology

## 2022-01-31 ENCOUNTER — Other Ambulatory Visit (HOSPITAL_COMMUNITY): Payer: Self-pay | Admitting: *Deleted

## 2022-01-31 DIAGNOSIS — Z539 Procedure and treatment not carried out, unspecified reason: Secondary | ICD-10-CM | POA: Diagnosis not present

## 2022-01-31 SURGERY — CANCELLED PROCEDURE

## 2022-01-31 NOTE — H&P (Signed)
She did not have the procedure today- this was cancel

## 2022-01-31 NOTE — Progress Notes (Signed)
Patient arrived for cardioversion without a support person to be with her for 24 hours following cardioversion.  Per anesthesia policy, unable to proceed with cardioversion today.  Pt discharged home, to reschedule with office when support person available.  Vista Lawman, RN

## 2022-01-31 NOTE — Anesthesia Preprocedure Evaluation (Deleted)
Anesthesia Evaluation    Reviewed: Allergy & Precautions, Patient's Chart, lab work & pertinent test results, reviewed documented beta blocker date and time   History of Anesthesia Complications Negative for: history of anesthetic complications  Airway        Dental   Pulmonary sleep apnea ,  Sarcoidosis          Cardiovascular hypertension, Pt. on home beta blockers and Pt. on medications + dysrhythmias      Neuro/Psych  Headaches, Anxiety Depression    GI/Hepatic negative GI ROS, Neg liver ROS,   Endo/Other  negative endocrine ROS  Renal/GU Renal disease  negative genitourinary   Musculoskeletal  (+) Arthritis ,   Abdominal   Peds  Hematology negative hematology ROS (+)   Anesthesia Other Findings Day of surgery medications reviewed with patient.  Reproductive/Obstetrics negative OB ROS                             Anesthesia Physical Anesthesia Plan  ASA: 3  Anesthesia Plan: General   Post-op Pain Management: Minimal or no pain anticipated   Induction: Intravenous  PONV Risk Score and Plan: Treatment may vary due to age or medical condition and Propofol infusion  Airway Management Planned: Mask  Additional Equipment: None  Intra-op Plan:   Post-operative Plan:   Informed Consent:   Plan Discussed with:   Anesthesia Plan Comments:         Anesthesia Quick Evaluation

## 2022-02-02 ENCOUNTER — Encounter (HOSPITAL_COMMUNITY): Payer: Self-pay | Admitting: Internal Medicine

## 2022-02-05 DIAGNOSIS — I10 Essential (primary) hypertension: Secondary | ICD-10-CM | POA: Diagnosis not present

## 2022-02-05 DIAGNOSIS — G43719 Chronic migraine without aura, intractable, without status migrainosus: Secondary | ICD-10-CM | POA: Diagnosis not present

## 2022-02-05 DIAGNOSIS — F32A Depression, unspecified: Secondary | ICD-10-CM | POA: Diagnosis not present

## 2022-02-05 DIAGNOSIS — G4733 Obstructive sleep apnea (adult) (pediatric): Secondary | ICD-10-CM | POA: Diagnosis not present

## 2022-02-08 ENCOUNTER — Other Ambulatory Visit (HOSPITAL_COMMUNITY): Payer: Self-pay | Admitting: *Deleted

## 2022-02-08 DIAGNOSIS — I4819 Other persistent atrial fibrillation: Secondary | ICD-10-CM

## 2022-02-09 ENCOUNTER — Other Ambulatory Visit: Payer: Self-pay

## 2022-02-09 ENCOUNTER — Ambulatory Visit (HOSPITAL_COMMUNITY): Payer: BC Managed Care – PPO | Admitting: Anesthesiology

## 2022-02-09 ENCOUNTER — Encounter (HOSPITAL_COMMUNITY)
Admission: RE | Disposition: A | Discharge status: Home / Self Care | Payer: Self-pay | Source: Ambulatory Visit | Attending: Internal Medicine

## 2022-02-09 ENCOUNTER — Ambulatory Visit (HOSPITAL_COMMUNITY)
Admission: RE | Admit: 2022-02-09 | Discharge: 2022-02-09 | Disposition: A | Discharge status: Home / Self Care | Payer: BC Managed Care – PPO | Source: Ambulatory Visit | Attending: Internal Medicine | Admitting: Internal Medicine

## 2022-02-09 ENCOUNTER — Encounter (HOSPITAL_COMMUNITY): Payer: Self-pay | Admitting: Internal Medicine

## 2022-02-09 DIAGNOSIS — I10 Essential (primary) hypertension: Secondary | ICD-10-CM | POA: Diagnosis not present

## 2022-02-09 DIAGNOSIS — F32A Depression, unspecified: Secondary | ICD-10-CM | POA: Insufficient documentation

## 2022-02-09 DIAGNOSIS — Z79899 Other long term (current) drug therapy: Secondary | ICD-10-CM | POA: Insufficient documentation

## 2022-02-09 DIAGNOSIS — F418 Other specified anxiety disorders: Secondary | ICD-10-CM | POA: Diagnosis not present

## 2022-02-09 DIAGNOSIS — I4891 Unspecified atrial fibrillation: Secondary | ICD-10-CM | POA: Insufficient documentation

## 2022-02-09 DIAGNOSIS — I4819 Other persistent atrial fibrillation: Secondary | ICD-10-CM | POA: Diagnosis not present

## 2022-02-09 DIAGNOSIS — F419 Anxiety disorder, unspecified: Secondary | ICD-10-CM | POA: Diagnosis not present

## 2022-02-09 DIAGNOSIS — I700 Atherosclerosis of aorta: Secondary | ICD-10-CM | POA: Diagnosis not present

## 2022-02-09 DIAGNOSIS — E119 Type 2 diabetes mellitus without complications: Secondary | ICD-10-CM | POA: Insufficient documentation

## 2022-02-09 DIAGNOSIS — G4730 Sleep apnea, unspecified: Secondary | ICD-10-CM | POA: Insufficient documentation

## 2022-02-09 HISTORY — PX: CARDIOVERSION: SHX1299

## 2022-02-09 LAB — POCT I-STAT, CHEM 8
BUN: 34 mg/dL — ABNORMAL HIGH (ref 8–23)
Calcium, Ion: 1.19 mmol/L (ref 1.15–1.40)
Chloride: 102 mmol/L (ref 98–111)
Creatinine, Ser: 1.2 mg/dL — ABNORMAL HIGH (ref 0.44–1.00)
Glucose, Bld: 124 mg/dL — ABNORMAL HIGH (ref 70–99)
HCT: 44 % (ref 36.0–46.0)
Hemoglobin: 15 g/dL (ref 12.0–15.0)
Potassium: 3.9 mmol/L (ref 3.5–5.1)
Sodium: 140 mmol/L (ref 135–145)
TCO2: 27 mmol/L (ref 22–32)

## 2022-02-09 SURGERY — CARDIOVERSION
Anesthesia: General

## 2022-02-09 MED ORDER — PROPOFOL 10 MG/ML IV BOLUS
INTRAVENOUS | Status: DC | PRN
  Administered 2022-02-09: 60 mg via INTRAVENOUS

## 2022-02-09 MED ORDER — CALCIUM CHLORIDE 10 % IV SOLN
INTRAVENOUS | Status: DC | PRN
  Administered 2022-02-09: 150 mg via INTRAVENOUS

## 2022-02-09 MED ORDER — GLYCOPYRROLATE PF 0.2 MG/ML IJ SOSY
PREFILLED_SYRINGE | INTRAMUSCULAR | Status: DC | PRN
  Administered 2022-02-09: .2 mg via INTRAVENOUS

## 2022-02-09 MED ORDER — LIDOCAINE HCL (CARDIAC) PF 100 MG/5ML IV SOSY
PREFILLED_SYRINGE | INTRAVENOUS | Status: DC | PRN
  Administered 2022-02-09: 40 mg via INTRAVENOUS

## 2022-02-09 MED ORDER — EPHEDRINE SULFATE-NACL 50-0.9 MG/10ML-% IV SOSY
PREFILLED_SYRINGE | INTRAVENOUS | Status: DC | PRN
  Administered 2022-02-09 (×4): 5 mg via INTRAVENOUS

## 2022-02-09 MED ORDER — SODIUM CHLORIDE 0.9 % IV SOLN: INTRAVENOUS | Status: DC

## 2022-02-09 NOTE — Anesthesia Procedure Notes (Signed)
Procedure Name: General with mask airway Date/Time: 02/09/2022 10:51 AM  Performed by: Erick Colace, CRNAPre-anesthesia Checklist: Patient identified, Emergency Drugs available, Suction available and Patient being monitored Patient Re-evaluated:Patient Re-evaluated prior to induction Oxygen Delivery Method: Ambu bag Preoxygenation: Pre-oxygenation with 100% oxygen Induction Type: IV induction Dental Injury: Teeth and Oropharynx as per pre-operative assessment

## 2022-02-09 NOTE — Anesthesia Postprocedure Evaluation (Signed)
Anesthesia Post Note  Patient: Tara [name]  Procedure(s) Performed: CARDIOVERSION     Patient location during evaluation: PACU Anesthesia Type: General Level of consciousness: awake and alert Pain management: pain level controlled Vital Signs Assessment: post-procedure vital signs reviewed and stable Respiratory status: spontaneous breathing, nonlabored ventilation, respiratory function stable and patient connected to nasal cannula oxygen Cardiovascular status: blood pressure returned to baseline and stable Postop Assessment: no apparent nausea or vomiting Anesthetic complications: no   No notable events documented.  Last Vitals:  Vitals:   02/09/22 1130 02/09/22 1141  BP: 121/62 134/62  Pulse: (!) 40 (!) 38  Resp: 16 17  SpO2: 99% 99%    Last Pain:  Vitals:   02/09/22 1141  PainSc: 0-No pain                 Effie Berkshire

## 2022-02-09 NOTE — Transfer of Care (Signed)
Immediate Anesthesia Transfer of Care Note  Patient: Tara [name]  Procedure(s) Performed: CARDIOVERSION  Patient Location: Short Stay  Anesthesia Type:General  Level of Consciousness: awake  Airway & Oxygen Therapy: Patient Spontanous Breathing  Post-op Assessment: Report given to RN and Post -op Vital signs reviewed and stable  Post vital signs: Reviewed and stable  Last Vitals:  Vitals Value Taken Time  BP 105/51   Temp    Pulse 44   Resp 15   SpO2 97     Last Pain:  Vitals:   02/09/22 1024  PainSc: 0-No pain         Complications: No notable events documented.

## 2022-02-09 NOTE — Interval H&P Note (Signed)
History and Physical Interval Note:  02/09/2022 10:21 AM  Tara [name]  has presented today for surgery, with the diagnosis of AFIB.  The various methods of treatment have been discussed with the patient and family. After consideration of risks, benefits and other options for treatment, the patient has consented to  Procedure(s): CARDIOVERSION (N/A) as a surgical intervention.  The patient's history has been reviewed, patient examined, no change in status, stable for surgery.  I have reviewed the patient's chart and labs.  Questions were answered to the patient's satisfaction.     [name]

## 2022-02-09 NOTE — CV Procedure (Signed)
   CARDIOVERSION NOTE  Procedure: Electrical Cardioversion Indications:  Atrial Fibrillation  Procedure Details:  Consent: Risks of procedure as well as the alternatives and risks of each were explained to the (patient/caregiver).  Consent for procedure obtained.  Time Out: Verified patient identification, verified procedure, site/side was marked, verified correct patient position, special equipment/implants available, medications/allergies/relevent history reviewed, required imaging and test results available.  Performed  Patient placed on cardiac monitor, pulse oximetry, supplemental oxygen as necessary.  Sedation given:  propofol per anesthesia Pacer pads placed anterior and posterior chest.  Cardioverted 1 time(s).  Cardioverted at 200J biphasic.  Impression: Findings: Post procedure EKG shows:  junctional bradycardia Complications: None Patient did tolerate procedure well.  Plan: Successful DCCV - long termination pause, then noted to be in a junctional bradycardia with occasional sinus beats. Stop atenolol Continue diltiazem (may need reduced dose) and flecainide Follow-up scheduled with Dr. Harl Bowie on 8/10  Time Spent Directly with the Patient:  26 minutes   [name], MD, [facility], [place] Director of the [facility] &  Cardiovascular [facility] Reduction Clinic Diplomate of the American Board of Clinical Lipidology Attending Cardiologist  Direct Dial: [phone]  Fax: [phone]  Website:  [email or web address]  Nadean Corwin Ted Goodner 02/09/2022, 10:56 AM

## 2022-02-09 NOTE — Anesthesia Preprocedure Evaluation (Addendum)
Anesthesia Evaluation  Patient identified by MRN, date of birth, ID band Patient awake    Reviewed: Allergy & Precautions, NPO status , Patient's Chart, lab work & pertinent test results, reviewed documented beta blocker date and time   Airway Mallampati: I  TM Distance: >3 FB Neck ROM: Full    Dental  (+) Teeth Intact, Dental Advisory Given   Pulmonary sleep apnea ,    breath sounds clear to auscultation       Cardiovascular hypertension, Pt. on medications and Pt. on home beta blockers  Rhythm:Irregular Rate:Normal  Echo: 1. Left ventricular ejection fraction, by estimation, is 55 to 60%. The  left ventricle has normal function. The left ventricle has no regional  wall motion abnormalities. Left ventricular diastolic parameters are  indeterminate.  2. Right ventricular systolic function is normal. The right ventricular  size is normal. There is normal pulmonary artery systolic pressure.  3. Left atrial size was moderately dilated.  4. Right atrial size was mildly dilated.  5. The mitral valve is abnormal. Trivial mitral valve regurgitation. No  evidence of mitral stenosis.  6. The aortic valve is tricuspid. There is mild calcification of the  aortic valve. Aortic valve regurgitation is trivial. Aortic valve  sclerosis is present, with no evidence of aortic valve stenosis.  7. The inferior vena cava is normal in size with greater than 50%  respiratory variability, suggesting right atrial pressure of 3 mmHg.    Neuro/Psych  Headaches, PSYCHIATRIC DISORDERS Anxiety Depression    GI/Hepatic negative GI ROS, Neg liver ROS,   Endo/Other  diabetes  Renal/GU Renal disease     Musculoskeletal  (+) Arthritis ,   Abdominal Normal abdominal exam  (+)   Peds  Hematology negative hematology ROS (+)   Anesthesia Other Findings   Reproductive/Obstetrics                            Anesthesia  Physical Anesthesia Plan  ASA: 3  Anesthesia Plan: General   Post-op Pain Management:    Induction: Intravenous  PONV Risk Score and Plan: 0 and Propofol infusion  Airway Management Planned: Simple Face Mask and Natural Airway  Additional Equipment: None  Intra-op Plan:   Post-operative Plan:   Informed Consent:   Plan Discussed with: CRNA  Anesthesia Plan Comments:         Anesthesia Quick Evaluation

## 2022-02-09 NOTE — Discharge Instructions (Signed)

## 2022-02-10 NOTE — Progress Notes (Signed)
HPI F never smoker followed for sarcoid, OSA/ failed CPAP, complicated by hypercalcemia, renal insufficency, glaucoma PFT 02/07/15-minimal obstructive airways disease, minimal diffusion defect, insignificant response to bronchodilator ACE in July, 2017 remained elevated 74 (8-52) NPSG  12/ 11/09 - AhI 1.4/hour I desaturation to 89%, body weight 245 lbs Unattended Home Sleep Test 03/12/17-AHI 27.6/hour, desaturation to 75%, body weight 220 pounds PFT 03/11/19- minimal obstruction, no resp to BD, likely mild restriction HST 11/25/21-AHI 36/ hr, desaturation to 83%, body weight 221 lbs --------------------------------------------------------------------------   10/12/21- 71 year old female never smoker followed for Sarcoid, Insomnia, OSA/ oral appliance Dr Toy Cookey, complicated by Hypercalcemia, CKD4, HTN, depression, glaucoma, Migraine, Bipolar,  No longer using OAP. Just wears Night Guard, -Sonata Body weight today-190 lbs Covid vax-5 Moderna Flu vax-had -----Patient does not wear oral device for sleep apnea. Has been having some headaches throughout the day She saw Dr. Johnnette Gourd, considering Dawna Part and understood she needed to have an updated sleep study to move forward.  We will schedule at with her wearing her "night guard" bruxism mouthpiece. Trial with OAP did not help headaches, which she describes as starting with a painful itch in her nose..   She still takes Sonata but action is too short.  02/12/22-  71 year old female never smoker followed for Sarcoid, Insomnia, OSA/ oral appliance Dr Toy Cookey, complicated by Hypercalcemia, CKD4, HTN, depression, Glaucoma, Migraine, Bipolar, AFib/ Cardioversion/Eliquis,  No longer using OAP. Just wears Night Guard, HST 11/25/21-AHI 36/ hr, desaturation to 83%, body weight 221 lbs -Sonata Body weight today-190 lbs Covid vax-5 Moderna -----Follow-up: Sleep study. Recently Dx with AFib. Previously failed to tolerate CPAP, complaining of eye irritation from  air flow, and OAP. Considering exploring Inspire- discussed.  Discussed association of OSA with AFib. She will try CPAP again, with eye protection. Will also look more at [facility]. CXR 10/14/21- Mild cardiac enlargement.  No evidence of active pulmonary disease.  ROS- see HPI  + = positive Constitutional:     weight loss, no-night sweats, fevers, chills, + fatigue, lassitude. HEENT:   + headaches, +difficulty swallowing, tooth/dental problems, sore throat,       No-  sneezing, itching, ear ache, nasal congestion, post nasal drip,  CV:  No- chest pain, no-orthopnea, PND, swelling in lower extremities, anasarca,dizziness, +palpitations  Resp: + shortness of breath with exertion or at rest.              No-   productive cough,  + non-productive cough,  No- coughing up of blood.              No- wheezing.   Skin: No-   rash or lesions.  +cheeks burn GI:  No-   heartburn, indigestion, abdominal pain, nausea, vomiting,  GU:  MS:  +  joint pain or swelling.   Neuro-     +facial tics Psych:  No- change in mood or affect. No depression or anxiety.  No memory loss.  OBJ- Physical Exam   General- Alert, Oriented, Affect-appropriate, Distress- none acute,  + overweight Skin- rash-none, lesions- none, excoriation- none,  Lymphadenopathy- none Head- atraumatic            Eyes- +continues frequent blinking             Ears- Hearing, canals-normal            Nose- Clear, no-Septal dev, mucus, polyps, erosion, perforation             Throat- Mallampati III, mucosa clear , drainage- none, tonsils- atrophic Neck-  flexible , trachea midline, no stridor , thyroid nl, carotid no bruit Chest - symmetrical excursion , unlabored           Heart/CV- RRR + extra beats , no murmur , no gallop  , no rub, nl s1 s2                           - JVD- none ,, varices- none           Lung- clear to P&A, wheeze- none, cough- none , dullness-none, rub- none           Chest wall-  Abd-  Br/ Gen/ Rectal- Not done, not  indicated Extrem- cyanosis- none, clubbing, none, atrophy- none, strength- nl Neuro- +blinking, grimacing, ? Tics or frontal lobe ?

## 2022-02-11 ENCOUNTER — Encounter (HOSPITAL_COMMUNITY): Payer: Self-pay | Admitting: Internal Medicine

## 2022-02-12 ENCOUNTER — Encounter: Payer: Self-pay | Admitting: Internal Medicine

## 2022-02-12 ENCOUNTER — Ambulatory Visit (INDEPENDENT_AMBULATORY_CARE_PROVIDER_SITE_OTHER): Payer: Self-pay | Admitting: Internal Medicine

## 2022-02-12 VITALS — BP 110/60 | HR 51 | Temp 97.8°F | Ht 64.0 in | Wt 198.8 lb

## 2022-02-12 DIAGNOSIS — G4730 Sleep apnea, unspecified: Secondary | ICD-10-CM

## 2022-02-12 DIAGNOSIS — G4733 Obstructive sleep apnea (adult) (pediatric): Secondary | ICD-10-CM

## 2022-02-12 DIAGNOSIS — I4819 Other persistent atrial fibrillation: Secondary | ICD-10-CM

## 2022-02-12 NOTE — Patient Instructions (Addendum)
Order- new DME, new CPAP auto 5-20, mask of choice, humidifier, supplies, AirView/ card   dx OSA  Order- referral to ENT/ Dr Redmond Baseman    follow up with possible Inspire for dx OSA

## 2022-02-13 ENCOUNTER — Encounter: Payer: Self-pay | Admitting: Internal Medicine

## 2022-02-14 DIAGNOSIS — F5101 Primary insomnia: Secondary | ICD-10-CM | POA: Diagnosis not present

## 2022-02-14 DIAGNOSIS — F900 Attention-deficit hyperactivity disorder, predominantly inattentive type: Secondary | ICD-10-CM | POA: Diagnosis not present

## 2022-02-14 DIAGNOSIS — F3342 Major depressive disorder, recurrent, in full remission: Secondary | ICD-10-CM | POA: Diagnosis not present

## 2022-02-14 DIAGNOSIS — F411 Generalized anxiety disorder: Secondary | ICD-10-CM | POA: Diagnosis not present

## 2022-02-15 NOTE — Telephone Encounter (Signed)
Called [facility] and spoke with Brumley. Unsure why pt was not given CPAP as she had a recent sleep study, OV and order. [facility] [place] states they will reach out to pt to get pt back on CPAP.

## 2022-02-22 ENCOUNTER — Ambulatory Visit (INDEPENDENT_AMBULATORY_CARE_PROVIDER_SITE_OTHER): Payer: BC Managed Care – PPO | Admitting: Internal Medicine

## 2022-02-22 ENCOUNTER — Encounter: Payer: Self-pay | Admitting: Internal Medicine

## 2022-02-22 VITALS — BP 128/68 | HR 80 | Ht 65.0 in | Wt 197.4 lb

## 2022-02-22 DIAGNOSIS — I4819 Other persistent atrial fibrillation: Secondary | ICD-10-CM | POA: Diagnosis not present

## 2022-02-22 NOTE — Progress Notes (Signed)
Cardiology Office Note:    Date:  02/22/2022   ID:  Tara [name], DOB 1950/12/19, MRN [id number]  PCP:  Biagio Borg, MD   [facility] Providers Cardiologist:  Janina Mayo, MD     Referring MD: Biagio Borg, MD   No chief complaint on file. Atrial Fibrillation  History of Present Illness:    Tara [name] is a 71 y.o. female with a hx of CKD, sarcoidosis (+biopsy), referral for paroxysmal atrial fibrillation  She was seen by GI for surveillance colonoscopy. During her colonoscopy noted to be in afib.  She broought in the 12 lead which shows atrial fibrillation.  TSH is normal. She has sleep apnea, difficulty with cpap adherence 2/2 dry eye exacerbation. She has a sleep study pending with pulmonology, sees Dr. Annamaria Boots. She's planned for repeat sleep study. She's being considered for Inspire. Blood pressures are well controlled. She denies significant ETOH. No GI bleeding history. No hx of stroke.  She's felt more tired than usual. She does not sense the palpations. No PND, orthopnea or LE edema.   Interim Hx 8/10 She is s/p DCCV , converted to junctional bradycardia. Her atenolol was stopped. She was seen by Dr. Annamaria Boots.  She can't tolerate her CPAP. She notes eye dryness. She was recommended to use an eye protector. She is being considered for inspire. She does a lot of walking, and she can feel she is running out of breath, but she can continue a 20 minute work out. She underwent another DCCV 7/28. She started flecinaide 50 mg BID and continues to follow with afib clinic. No PND or orthopnea. No LE edema  Cardiology Studies: TTE 12/10/2015: EF normal, LAVi 41 cc/m2 TTE 01/11/2022: Normal LV function, LAVi 49 cc/m2  Past Medical History:  Diagnosis Date   Anemia    on meds   ANKLE PAIN, LEFT 04/01/2008   ANXIETY 04/17/2007   on meds   Arthritis    generalized   Cataract    bilateral sx   Chronic kidney disease    stage 3   Colon polyp    COLONIC POLYPS, HX OF 08/01/2007    CONTUSIONS, MULTIPLE 04/01/2009   DEPRESSION 04/17/2007   on meds   DIZZINESS 08/01/2007   DYSPNEA 08/01/2007   with exertion   Enlargement of lymph nodes 08/13/2007   Excessive involuntary blinking    per pt,going on since 2018   Glaucoma    on meds   GLUCOSE INTOLERANCE 08/01/2007   Hypercalcemia due to sarcoidosis 2014   HYPERLIPIDEMIA 08/01/2007   no meds   HYPERSOMNIA 07/28/2008   HYPERTENSION 04/17/2007   Impaired glucose tolerance 03/23/2011   JOINT EFFUSION, LEFT KNEE 06/02/2010   Loose body in knee 04/01/2009   pt not sure?   Metabolic encephalopathy 62/22/9798-92/1194   Migraines    "stopped 3-4 yr ago" (07/23/2012)   Morbid obesity ([place]) 04/20/2007   OTHER DISEASES OF LUNG NOT ELSEWHERE CLASSIFIED 08/01/2007   Pain in joint, lower leg 04/01/2009   PERIPHERAL EDEMA 04/21/2009   Pre-diabetes    Sarcoidosis 09/25/2007   Seasonal allergies    SHOULDER PAIN, LEFT 04/01/2009   Sleep apnea    does not use Cpap    Past Surgical History:  Procedure Laterality Date   BREAST SURGERY     Biopsy benign/bil breaST   CARDIOVERSION N/A 12/15/2021   Procedure: CARDIOVERSION;  Surgeon: Skeet Latch, MD;  Location: [facility];  Service: Cardiovascular;  Laterality: N/A;   CARDIOVERSION N/A 02/09/2022  Procedure: CARDIOVERSION;  Surgeon: [name], MD;  Location: [facility];  Service: Cardiovascular;  Laterality: N/A;   CATARACT EXTRACTION     RIGHT EYE   COLONOSCOPY  2020   KN-MAC-suprep(good)-tics/TA's   COMBINED MEDIASTINOSCOPY AND BRONCHOSCOPY  08/2007   COMBINED MEDIASTINOSCOPY AND BRONCHOSCOPY  2009   Dental implant     FRACTURE SURGERY  ?02/1997   "left upper arm; put rod in" (07/23/2012)   GUM SURGERY  2000-?2009   "several ORs; soft tissue graft; took material from roof of mouth" (07/23/2012   HUMERUS SURGERY Left 1998   rod insertion   KNEE ARTHROSCOPY  03/2004; 06/2009   "right; left" Dr. Theda Sers   KNEE SURGERY  2012   ARTHROSCOPIC LEFT KNEE    LYMPH NODE BIOPSY  ~ 2009   "for sarcoidosis; don't know exactly which nodes" (07/23/2102)   [place]   Open   POLYPECTOMY  2020   TA's   REFRACTIVE SURGERY  08/1998   "both eyes" (07/23/2012)   [place]   Bil   TOTAL KNEE ARTHROPLASTY Left 09/24/2016   Procedure: TOTAL KNEE ARTHROPLASTY;  Surgeon: Dorna Leitz, MD;  Location: [facility] [place];  Service: Orthopedics;  Laterality: Left;    Current Medications: Current Outpatient Medications on File Prior to Visit  Medication Sig Dispense Refill   amoxicillin (AMOXIL) 500 MG tablet Take 2,000 mg by mouth once. Prior to dental appointments     apixaban (ELIQUIS) 5 MG TABS tablet Take 1 tablet (5 mg total) by mouth 2 (two) times daily. 90 tablet 3   baclofen (LIORESAL) 10 MG tablet Take 5 mg by mouth 2 (two) times daily as needed (Headache).     brimonidine-timolol (COMBIGAN) 0.2-0.5 % ophthalmic solution Place 1 drop into both eyes every 12 (twelve) hours.     carboxymethylcellulose (REFRESH PLUS) 0.5 % SOLN Place 1 drop into both eyes 3 (three) times daily as needed (Dry eyes). RETAIN eye drops instead of refresh     cholecalciferol (VITAMIN D) 25 MCG (1000 UNIT) tablet Take 1,000 Units by mouth daily.     clorazepate (TRANXENE) 7.5 MG tablet Take 7.5 mg by mouth 2 (two) times daily. Take additional 7.5 mg if needed for Anxiety     desvenlafaxine (PRISTIQ) 100 MG 24 hr tablet Take 100 mg by mouth at bedtime.     diltiazem (CARDIZEM CD) 240 MG 24 hr capsule Take 240 mg by mouth daily.     ferrous sulfate 325 (65 FE) MG tablet Take 325 mg by mouth daily with breakfast.     flecainide (TAMBOCOR) 50 MG tablet Take 1 tablet (50 mg total) by mouth 2 (two) times daily. 60 tablet 3   furosemide (LASIX) 40 MG tablet Take 1 tablet (40 mg total) by mouth 2 (two) times daily. (Patient taking differently: Take 40 mg by mouth daily.) 30 tablet 0   hydrALAZINE (APRESOLINE) 100 MG tablet Take 1 tablet (100 mg total) by mouth every 8 (eight)  hours.     Melatonin 5 MG TABS Take 10 mg by mouth at bedtime.     Multiple Vitamins-Minerals (MULTIVITAMIN WITH MINERALS) tablet Take 1 tablet by mouth daily.     Omega 3 1200 MG CAPS Take 2,400 mg by mouth at bedtime.     PREVIDENT 5000 DRY MOUTH 1.1 % GEL dental gel Place 1 application. onto teeth daily.     VYZULTA 0.024 % SOLN Place 1 drop into both eyes at bedtime.     zaleplon (  SONATA) 10 MG capsule Take 20 mg by mouth at bedtime.     Current Facility-Administered Medications on File Prior to Visit  Medication Dose Route Frequency Provider Last Rate Last Admin   0.9 %  sodium chloride infusion  500 mL Intravenous Once Nandigam, Venia Minks, MD         Allergies:   Fluoxetine hcl, Sulfa antibiotics, Chocolate, Floxin [ocuflox], Other, and Sulfonamide derivatives   Social History   Socioeconomic History   Marital status: Widowed    Spouse name: Not on file   Number of children: Not on file   Years of education: Not on file   Highest education level: Not on file  Occupational History   Occupation: Designer, jewellery  Tobacco Use   Smoking status: Never   Smokeless tobacco: Never   Tobacco comments:    Never smoke 12/25/21  Vaping Use   Vaping Use: Never used  Substance and Sexual Activity   Alcohol use: Yes    Alcohol/week: 1.0 - 2.0 standard drink of alcohol    Types: 1 - 2 Standard drinks or equivalent per week   Drug use: No   Sexual activity: Not Currently    Birth control/protection: Post-menopausal    Comment: 1st intercourse 65 yo-1 partner  Other Topics Concern   Not on file  Social History Narrative   Lives with 4 cats   Social Determinants of Health   Financial Resource Strain: Not on file  Food Insecurity: Not on file  Transportation Needs: Not on file  Physical Activity: Not on file  Stress: Not on file  Social Connections: Not on file     Family History: The patient's family history includes Asthma in her sister; Diabetes in her mother; Gout in  her sister; Heart disease (age of onset: 24) in her father; Hypertension in her father, mother, and sister; Stroke in her mother. There is no history of Colon cancer, Breast cancer, Esophageal cancer, Stomach cancer, Rectal cancer, or Colon polyps.  ROS:   Please see the history of present illness.     All other systems reviewed and are negative.  EKGs/Labs/Other Studies Reviewed:    The following studies were reviewed today:   EKG:  EKG is  ordered today.  The ekg ordered today demonstrates   11/23/2021-Rate controlled atrial fibrillation   02/22/2022- atrial fibrillation rate 80  Recent Labs: 10/10/2021: ALT 38; TSH 2.68 01/25/2022: Platelets 218 02/09/2022: BUN 34; Creatinine, Ser 1.20; Hemoglobin 15.0; Potassium 3.9; Sodium 140    Recent Lipid Panel    Component Value Date/Time   CHOL 110 10/10/2021 1013   TRIG 56.0 10/10/2021 1013   HDL 56.20 10/10/2021 1013   CHOLHDL 2 10/10/2021 1013   VLDL 11.2 10/10/2021 1013   LDLCALC 43 10/10/2021 1013   LDLDIRECT 50.0 09/24/2017 1614     Risk Assessment/Calculations:    CHA2DS2-VASc Score = 3   This indicates a 3.2% annual risk of stroke. The patient's score is based upon: CHF History: 0 HTN History: 1 Diabetes History: 0 Stroke History: 0 Vascular Disease History: 0 Age Score: 1 Gender Score: 1          Physical Exam:    VS:    Vitals:   02/22/22 1021  BP: 128/68  Pulse: 80  SpO2: 100%     Wt Readings from Last 3 Encounters:  02/22/22 197 lb 6.4 oz (89.5 kg)  02/12/22 198 lb 12.8 oz (90.2 kg)  02/09/22 192 lb (87.1 kg)  GEN:  Well nourished, well developed in no acute distress HEENT: Normal NECK: No JVD; No carotid bruits LYMPHATICS: No lymphadenopathy CARDIAC: irregularly irregular no murmurs, rubs, gallops RESPIRATORY:  Clear to auscultation without rales, wheezing or rhonchi  ABDOMEN: Soft, non-tender, non-distended MUSCULOSKELETAL:  No edema; No deformity  SKIN: Warm and dry NEUROLOGIC:   Alert and oriented x 3 PSYCHIATRIC:  Normal affect   ASSESSMENT:    Persistent Atrial Fibrillation: New Onset. CHADS2VASC 3. It is challenging maintaining her in sinus rhythm. Her LA is severely dilated. Highly encouraged continuing her CPAP with the eye protector for OSA which is contributing to the challenge with keeping her in rhythm.  She is asymptomatic. Can continue this regimen for now, if remains in afib despite management of OSA/and/or has symptoms. Would consider PVI. She has sarcoidosis, would like to avoid amiodarone if feasible. Otherwise, she develops significant conduction dx, would get CMR to assess for cardiac sarcoid in which case an ICD would be indicated. - FU afib clinic -continue diltiazem 240 mg daily  -continue eliquis 5 mg BID -continue flecainide 50 mg BID, followed by Afib clinic (no significant change in QRS width)  HTN- well controlled. Continue current regimen above  CKD Stage III- GFR 48. on lasix 40 mg BID  PLAN:    In order of problems listed above:  Follow up 6 months     Shared Decision Making/Informed Consent The risks (stroke, cardiac arrhythmias rarely resulting in the need for a temporary or permanent pacemaker, skin irritation or burns and complications associated with conscious sedation including aspiration, arrhythmia, respiratory failure and death), benefits (restoration of normal sinus rhythm) and alternatives of a direct current cardioversion were explained in detail to Ms. Reich and she agrees to proceed.      Medication Adjustments/Labs and Tests Ordered: Current medicines are reviewed at length with the patient today.  Concerns regarding medicines are outlined above.  No orders of the defined types were placed in this encounter.  No orders of the defined types were placed in this encounter.   There are no Patient Instructions on file for this visit.   Signed, Janina Mayo, MD  02/22/2022 10:29 AM    [facility]

## 2022-02-22 NOTE — Patient Instructions (Signed)
Medication Instructions:  Your physician recommends that you continue on your current medications as directed. Please refer to the Current Medication list given to you today.  *If you need a refill on your cardiac medications before your next appointment, please call your pharmacy*   Follow-Up: At [facility], you and your health needs are our priority.  As part of our continuing mission to provide you with exceptional heart care, we have created designated Provider Care Teams.  These Care Teams include your primary Cardiologist (physician) and Advanced Practice Providers (APPs -  Physician Assistants and Nurse Practitioners) who all work together to provide you with the care you need, when you need it.  We recommend signing up for the patient portal called "MyChart".  Sign up information is provided on this After Visit Summary.  MyChart is used to connect with patients for Virtual Visits (Telemedicine).  Patients are able to view lab/test results, encounter notes, upcoming appointments, etc.  Non-urgent messages can be sent to your provider as well.   To learn more about what you can do with MyChart, go to [email or web address].    Your next appointment:   6 month(s)  The format for your next appointment:   In Person  Provider:   Janina Mayo, MD

## 2022-03-02 DIAGNOSIS — G2401 Drug induced subacute dyskinesia: Secondary | ICD-10-CM | POA: Diagnosis not present

## 2022-03-02 DIAGNOSIS — D869 Sarcoidosis, unspecified: Secondary | ICD-10-CM | POA: Diagnosis not present

## 2022-03-02 DIAGNOSIS — G245 Blepharospasm: Secondary | ICD-10-CM | POA: Insufficient documentation

## 2022-03-02 DIAGNOSIS — R279 Unspecified lack of coordination: Secondary | ICD-10-CM | POA: Diagnosis not present

## 2022-03-02 DIAGNOSIS — R1310 Dysphagia, unspecified: Secondary | ICD-10-CM | POA: Diagnosis not present

## 2022-03-09 ENCOUNTER — Other Ambulatory Visit (HOSPITAL_COMMUNITY): Payer: Self-pay | Admitting: *Deleted

## 2022-03-09 DIAGNOSIS — H401113 Primary open-angle glaucoma, right eye, severe stage: Secondary | ICD-10-CM | POA: Diagnosis not present

## 2022-03-09 MED ORDER — FLECAINIDE ACETATE 100 MG PO TABS: 100.0000 mg | ORAL_TABLET | Freq: Two times a day (BID) | ORAL | 3 refills | Status: DC

## 2022-03-13 ENCOUNTER — Emergency Department (HOSPITAL_BASED_OUTPATIENT_CLINIC_OR_DEPARTMENT_OTHER)
Admission: EM | Admit: 2022-03-13 | Discharge: 2022-03-14 | Disposition: A | Discharge status: Home / Self Care | Payer: BC Managed Care – PPO | Attending: Emergency Medicine | Admitting: Emergency Medicine

## 2022-03-13 ENCOUNTER — Other Ambulatory Visit: Payer: Self-pay

## 2022-03-13 ENCOUNTER — Encounter (HOSPITAL_BASED_OUTPATIENT_CLINIC_OR_DEPARTMENT_OTHER): Payer: Self-pay | Admitting: Emergency Medicine

## 2022-03-13 DIAGNOSIS — R55 Syncope and collapse: Secondary | ICD-10-CM

## 2022-03-13 DIAGNOSIS — Z7901 Long term (current) use of anticoagulants: Secondary | ICD-10-CM | POA: Insufficient documentation

## 2022-03-13 DIAGNOSIS — N189 Chronic kidney disease, unspecified: Secondary | ICD-10-CM | POA: Insufficient documentation

## 2022-03-13 DIAGNOSIS — Z79899 Other long term (current) drug therapy: Secondary | ICD-10-CM | POA: Insufficient documentation

## 2022-03-13 DIAGNOSIS — I10 Essential (primary) hypertension: Secondary | ICD-10-CM | POA: Diagnosis not present

## 2022-03-13 DIAGNOSIS — W19XXXA Unspecified fall, initial encounter: Secondary | ICD-10-CM | POA: Diagnosis not present

## 2022-03-13 DIAGNOSIS — I4819 Other persistent atrial fibrillation: Secondary | ICD-10-CM | POA: Diagnosis not present

## 2022-03-13 DIAGNOSIS — I4891 Unspecified atrial fibrillation: Secondary | ICD-10-CM | POA: Diagnosis not present

## 2022-03-13 LAB — CBG MONITORING, ED: Glucose-Capillary: 204 mg/dL — ABNORMAL HIGH (ref 70–99)

## 2022-03-13 NOTE — ED Triage Notes (Signed)
Patient arrived via EMS c/o dizziness while walking 1hr pta. Patient states feeling dizzy then fell down, scraping rt elbow in process. Patient denies any pain at this time. Patient still feeling dizzy in triage. Patient is AO x 4, VS w/ elevated BP, in wheelchair.

## 2022-03-14 LAB — URINALYSIS, MICROSCOPIC (REFLEX)

## 2022-03-14 LAB — URINALYSIS, ROUTINE W REFLEX MICROSCOPIC
Bilirubin Urine: NEGATIVE
Glucose, UA: NEGATIVE mg/dL
Hgb urine dipstick: NEGATIVE
Ketones, ur: NEGATIVE mg/dL
Nitrite: NEGATIVE
Protein, ur: NEGATIVE mg/dL
Specific Gravity, Urine: 1.01 (ref 1.005–1.030)
pH: 7 (ref 5.0–8.0)

## 2022-03-14 LAB — CBC
HCT: 43.2 % (ref 36.0–46.0)
Hemoglobin: 14.4 g/dL (ref 12.0–15.0)
MCH: 28.2 pg (ref 26.0–34.0)
MCHC: 33.3 g/dL (ref 30.0–36.0)
MCV: 84.7 fL (ref 80.0–100.0)
Platelets: 205 10*3/uL (ref 150–400)
RBC: 5.1 MIL/uL (ref 3.87–5.11)
RDW: 14.7 % (ref 11.5–15.5)
WBC: 8.1 10*3/uL (ref 4.0–10.5)
nRBC: 0 % (ref 0.0–0.2)

## 2022-03-14 LAB — BASIC METABOLIC PANEL
Anion gap: 8 (ref 5–15)
BUN: 29 mg/dL — ABNORMAL HIGH (ref 8–23)
CO2: 28 mmol/L (ref 22–32)
Calcium: 8.7 mg/dL — ABNORMAL LOW (ref 8.9–10.3)
Chloride: 98 mmol/L (ref 98–111)
Creatinine, Ser: 1.23 mg/dL — ABNORMAL HIGH (ref 0.44–1.00)
GFR, Estimated: 48 mL/min — ABNORMAL LOW (ref >60)
Glucose, Bld: 147 mg/dL — ABNORMAL HIGH (ref 70–99)
Potassium: 3.4 mmol/L — ABNORMAL LOW (ref 3.5–5.1)
Sodium: 134 mmol/L — ABNORMAL LOW (ref 135–145)

## 2022-03-14 NOTE — ED Provider Notes (Signed)
[facility]  Provider Note  CSN: [id number] Arrival date & time: 03/13/22 2236  History Chief Complaint  Patient presents with   Near Syncope    Tara [name] is a 71 y.o. female with history of multiple medical problems including CKD, sarcoidosis with recent diagnosis of afib which did not respond to DCCV x 2. Currently on Cardizem CD '240mg'$ /day, flecainide 21m BID and Eliquis reports for the last 2-3 days she has had episodes of feeling dizzy/lightheaded while out walking. Tonight she got so dizzy she fell down, scraping her R elbow, but denies any head injury. Did not have LOC. She has not had any fever, CP, SOB, nausea, vomiting, diarrhea or dysuria recently. She was taken off atenolol when she started flecainide but has not had any other recent medications changes. She did recently start wearing her CPAP again.    Home Medications Prior to Admission medications   Medication Sig Start Date End Date Taking? Authorizing Provider  amoxicillin (AMOXIL) 500 MG tablet Take 2,000 mg by mouth once. Prior to dental appointments 08/03/19   [provider]  apixaban (ELIQUIS) 5 MG TABS tablet Take 1 tablet (5 mg total) by mouth 2 (two) times daily. 11/23/21   BJanina Mayo MD  baclofen (LIORESAL) 10 MG tablet Take 5 mg by mouth 2 (two) times daily as needed (Headache). 09/01/19   [provider]  brimonidine-timolol (COMBIGAN) 0.2-0.5 % ophthalmic solution Place 1 drop into both eyes every 12 (twelve) hours. 12/02/19   [provider]  carboxymethylcellulose (REFRESH PLUS) 0.5 % SOLN Place 1 drop into both eyes 3 (three) times daily as needed (Dry eyes). RETAIN eye drops instead of refresh    [provider]  cholecalciferol (VITAMIN D) 25 MCG (1000 UNIT) tablet Take 1,000 Units by mouth daily.    [provider]  clorazepate (TRANXENE) 7.5 MG tablet Take 7.5 mg by mouth 2 (two) times daily. Take additional 7.5 mg if needed  for Anxiety    [provider]  desvenlafaxine (PRISTIQ) 100 MG 24 hr tablet Take 100 mg by mouth at bedtime.    [provider]  diltiazem (CARDIZEM CD) 240 MG 24 hr capsule Take 240 mg by mouth daily. 12/07/21   [provider]  ferrous sulfate 325 (65 FE) MG tablet Take 325 mg by mouth daily with breakfast.    [provider]  flecainide (TAMBOCOR) 100 MG tablet Take 1 tablet (100 mg total) by mouth 2 (two) times daily. 03/09/22   Fenton, Clint R, PA  furosemide (LASIX) 40 MG tablet Take 1 tablet (40 mg total) by mouth 2 (two) times daily. Patient taking differently: Take 40 mg by mouth daily. 12/27/15   AHosie Poisson MD  hydrALAZINE (APRESOLINE) 100 MG tablet Take 1 tablet (100 mg total) by mouth every 8 (eight) hours. 12/22/15   AHosie Poisson MD  Melatonin 5 MG TABS Take 10 mg by mouth at bedtime.    [provider]  Multiple Vitamins-Minerals (MULTIVITAMIN WITH MINERALS) tablet Take 1 tablet by mouth daily.    [provider]  Omega 3 1200 MG CAPS Take 2,400 mg by mouth at bedtime.    [provider]  PREVIDENT 5000 DRY MOUTH 1.1 % GEL dental gel Place 1 application. onto teeth daily. 09/22/19   [provider]  VYZULTA 0.024 % SOLN Place 1 drop into both eyes at bedtime. 09/05/21   [provider]  zaleplon (SONATA) 10 MG capsule Take 20 mg  by mouth at bedtime.    [provider]     Allergies    Fluoxetine hcl, Sulfa antibiotics, Chocolate, Floxin [ocuflox], Other, and Sulfonamide derivatives   Review of Systems   Review of Systems Please see HPI for pertinent positives and negatives  Physical Exam BP (!) 171/91   Pulse 61   Temp 98.2 F (36.8 C) (Oral)   Resp 16   Ht '5\' 6"'$  (1.676 m)   Wt 89.8 kg   SpO2 100%   BMI 31.96 kg/m   Physical Exam Vitals and nursing note reviewed.  Constitutional:      Appearance: Normal appearance.  HENT:     Head: Normocephalic and atraumatic.     Nose:  Nose normal.     Mouth/Throat:     Mouth: Mucous membranes are moist.  Eyes:     Extraocular Movements: Extraocular movements intact.     Conjunctiva/sclera: Conjunctivae normal.  Cardiovascular:     Rate and Rhythm: Normal rate. Rhythm irregular.  Pulmonary:     Effort: Pulmonary effort is normal.     Breath sounds: Normal breath sounds.  Abdominal:     General: Abdomen is flat.     Palpations: Abdomen is soft.     Tenderness: There is no abdominal tenderness.  Musculoskeletal:        General: No swelling, tenderness or deformity. Normal range of motion.     Cervical back: Neck supple. No tenderness.  Skin:    General: Skin is warm and dry.     Comments: abrasion R elbow  Neurological:     General: No focal deficit present.     Mental Status: She is alert.     Cranial Nerves: No cranial nerve deficit.     Sensory: No sensory deficit.     Motor: No weakness.     Comments: Tardive dyskinesia  Psychiatric:        Mood and Affect: Mood normal.     ED Results / Procedures / Treatments   EKG EKG Interpretation  Date/Time:  Tuesday March 13 2022 22:55:25 EDT Ventricular Rate:  72 PR Interval:    QRS Duration: 98 QT Interval:  412 QTC Calculation: 451 R Axis:   54 Text Interpretation: Atrial fibrillation Cannot rule out Anterior infarct , age undetermined Abnormal ECG When compared with ECG of 09-Feb-2022 11:06, PREVIOUS ECG IS PRESENT since last tracing, afib has replaced junctional bradycardia however priors with Afib appear similar. Confirmed by Calvert Cantor [phone]) on 03/14/2022 12:28:06 AM  Procedures Procedures  Medications Ordered in the ED Medications - No data to display  Initial Impression and Plan  Patient here with episodes of dizziness, only happens when she is walking. She states she walks about 20,000 steps per day as a routine but has not typically had dizziness like this until the last few days. She is asymptomatic on arrival here. Will check labs,  orthostatics and continue to monitor. Currently rate controlled afib on cardiac monitor.   ED Course   Clinical Course as of 03/14/22 0337  Wed Mar 14, 2022  0041 CBC is normal.  [CS]  0140 BMP with CKD at baseline. UA with some bacteria but no other signs of infection. Not significantly orthostatic.  [CS]  0221 Patient able to ambulate without dizziness. No other concerning findings while in the ED. Recommend she discuss her symptoms with her cardiologist as it may be side effects from her afib medications. Otherwise she was advised to be cautious when walking. Avoid  being out in the heat. RtED for any other concerns.  [CS]    Clinical Course User Index [CS] Truddie Hidden, MD     MDM Rules/Calculators/A&P Medical Decision Making Given presenting complaint, I considered that admission might be necessary. After review of results from ED lab and/or imaging studies, admission to the hospital is not indicated at this time.    Problems Addressed: Near syncope: acute illness or injury Persistent atrial fibrillation [facility]): chronic illness or injury  Amount and/or Complexity of Data Reviewed Labs: ordered. Decision-making details documented in ED Course. ECG/medicine tests: ordered and independent interpretation performed. Decision-making details documented in ED Course.  Risk Decision regarding hospitalization.    Final Clinical Impression(s) / ED Diagnoses Final diagnoses:  Near syncope  Persistent atrial fibrillation ([place])    Rx / DC Orders ED Discharge Orders          Ordered    Ambulatory referral to Cardiology       Comments: If you have not heard from the Cardiology office within the next 72 hours please call [phone].   03/14/22 9622             Truddie Hidden, MD 03/14/22 [phone]

## 2022-03-16 ENCOUNTER — Other Ambulatory Visit: Payer: Self-pay

## 2022-03-16 ENCOUNTER — Encounter: Payer: Self-pay | Admitting: Internal Medicine

## 2022-03-16 ENCOUNTER — Ambulatory Visit: Payer: BC Managed Care – PPO | Attending: Internal Medicine | Admitting: Internal Medicine

## 2022-03-16 VITALS — BP 138/82 | HR 88 | Ht 66.0 in | Wt 197.2 lb

## 2022-03-16 DIAGNOSIS — E860 Dehydration: Secondary | ICD-10-CM

## 2022-03-16 DIAGNOSIS — D869 Sarcoidosis, unspecified: Secondary | ICD-10-CM | POA: Diagnosis not present

## 2022-03-16 DIAGNOSIS — G2401 Drug induced subacute dyskinesia: Secondary | ICD-10-CM | POA: Diagnosis not present

## 2022-03-16 MED ORDER — DILTIAZEM HCL ER COATED BEADS 240 MG PO CP24: 240.0000 mg | ORAL_CAPSULE | Freq: Every day | ORAL | 1 refills | Status: DC

## 2022-03-16 NOTE — Patient Instructions (Signed)
Medication Instructions:  Your physician has recommended you make the following change in your medication:  Stop: Lasix  *If you need a refill on your cardiac medications before your next appointment, please call your pharmacy*   Lab Work: None    Testing/Procedures: None   Follow-Up: At [facility], you and your health needs are our priority.  As part of our continuing mission to provide you with exceptional heart care, we have created designated Provider Care Teams.  These Care Teams include your primary Cardiologist (physician) and Advanced Practice Providers (APPs -  Physician Assistants and Nurse Practitioners) who all work together to provide you with the care you need, when you need it.  We recommend signing up for the patient portal called "MyChart".  Sign up information is provided on this After Visit Summary.  MyChart is used to connect with patients for Virtual Visits (Telemedicine).  Patients are able to view lab/test results, encounter notes, upcoming appointments, etc.  Non-urgent messages can be sent to your provider as well.   To learn more about what you can do with MyChart, go to [email or web address].    Your next appointment:   Feb 15th at [place]  The format for your next appointment:   In Person  Provider:   Janina Mayo, MD     Other Instructions   Important Information About Sugar

## 2022-03-16 NOTE — Progress Notes (Signed)
Cardiology Office Note:    Date:  03/16/2022   ID:  Tara [name], DOB 1951-06-13, MRN [id number]  PCP:  Biagio Borg, MD   [place] Providers Cardiologist:  Janina Mayo, MD     Referring MD: Truddie Hidden, MD   No chief complaint on file. Atrial Fibrillation  History of Present Illness:    Tara [name] is a 71 y.o. female with a hx of CKD, sarcoidosis (+biopsy), referral for paroxysmal atrial fibrillation  She was seen by GI for surveillance colonoscopy. During her colonoscopy noted to be in afib.  She broought in the 12 lead which shows atrial fibrillation.  TSH is normal. She has sleep apnea, difficulty with cpap adherence 2/2 dry eye exacerbation. She has a sleep study pending with pulmonology, sees Dr. Annamaria Boots. She's planned for repeat sleep study. She's being considered for Inspire. Blood pressures are well controlled. She denies significant ETOH. No GI bleeding history. No hx of stroke.  She's felt more tired than usual. She does not sense the palpations. No PND, orthopnea or LE edema.   Interim Hx 8/10 She is s/p DCCV , converted to junctional bradycardia. Her atenolol was stopped. She was seen by Dr. Annamaria Boots.  She can't tolerate her CPAP. She notes eye dryness. She was recommended to use an eye protector. She is being considered for inspire. She does a lot of walking, and she can feel she is running out of breath, but she can continue a 20 minute work out. She underwent another DCCV 7/28. She started flecinaide 50 mg BID and continues to follow with afib clinic. No PND or orthopnea. No LE edema  Interim Hx 9/1 She went to the ED and noted dizziness and weakness. She was walking outside for exercise and was dehydrated. She feels better   Cardiology Studies: TTE 12/10/2015: EF normal, LAVi 41 cc/m2 TTE 01/11/2022: Normal LV function, LAVi 49 cc/m2  Past Medical History:  Diagnosis Date   Anemia    on meds   ANKLE PAIN, LEFT 04/01/2008   ANXIETY 04/17/2007   on  meds   Arthritis    generalized   Cataract    bilateral sx   Chronic kidney disease    stage 3   Colon polyp    COLONIC POLYPS, HX OF 08/01/2007   CONTUSIONS, MULTIPLE 04/01/2009   DEPRESSION 04/17/2007   on meds   DIZZINESS 08/01/2007   DYSPNEA 08/01/2007   with exertion   Enlargement of lymph nodes 08/13/2007   Excessive involuntary blinking    per pt,going on since 2018   Glaucoma    on meds   GLUCOSE INTOLERANCE 08/01/2007   Hypercalcemia due to sarcoidosis 2014   HYPERLIPIDEMIA 08/01/2007   no meds   HYPERSOMNIA 07/28/2008   HYPERTENSION 04/17/2007   Impaired glucose tolerance 03/23/2011   JOINT EFFUSION, LEFT KNEE 06/02/2010   Loose body in knee 04/01/2009   pt not sure?   Metabolic encephalopathy 18/56/3149-70/2637   Migraines    "stopped 3-4 yr ago" (07/23/2012)   Morbid obesity ([place]) 04/20/2007   OTHER DISEASES OF LUNG NOT ELSEWHERE CLASSIFIED 08/01/2007   Pain in joint, lower leg 04/01/2009   PERIPHERAL EDEMA 04/21/2009   Pre-diabetes    Sarcoidosis 09/25/2007   Seasonal allergies    SHOULDER PAIN, LEFT 04/01/2009   Sleep apnea    does not use Cpap    Past Surgical History:  Procedure Laterality Date   BREAST SURGERY     Biopsy benign/bil breaST   CARDIOVERSION  N/A 12/15/2021   Procedure: CARDIOVERSION;  Surgeon: Skeet Latch, MD;  Location: [place];  Service: Cardiovascular;  Laterality: N/A;   CARDIOVERSION N/A 02/09/2022   Procedure: CARDIOVERSION;  Surgeon: [name], MD;  Location: [facility] ENDOSCOPY;  Service: Cardiovascular;  Laterality: N/A;   CATARACT EXTRACTION     RIGHT EYE   COLONOSCOPY  2020   KN-MAC-suprep(good)-tics/TA's   COMBINED MEDIASTINOSCOPY AND BRONCHOSCOPY  08/2007   COMBINED MEDIASTINOSCOPY AND BRONCHOSCOPY  2009   Dental implant     FRACTURE SURGERY  ?02/1997   "left upper arm; put rod in" (07/23/2012)   GUM SURGERY  2000-?2009   "several ORs; soft tissue graft; took material from roof of mouth" (07/23/2012   HUMERUS  SURGERY Left 1998   rod insertion   KNEE ARTHROSCOPY  03/2004; 06/2009   "right; left" Dr. Theda Sers   KNEE SURGERY  2012   ARTHROSCOPIC LEFT KNEE   LYMPH NODE BIOPSY  ~ 2009   "for sarcoidosis; don't know exactly which nodes" (07/23/2102)   [place]   Open   POLYPECTOMY  2020   TA's   REFRACTIVE SURGERY  08/1998   "both eyes" (07/23/2012)   [place]   Bil   TOTAL KNEE ARTHROPLASTY Left 09/24/2016   Procedure: TOTAL KNEE ARTHROPLASTY;  Surgeon: Dorna Leitz, MD;  Location: [place];  Service: Orthopedics;  Laterality: Left;    Current Medications: Current Outpatient Medications on File Prior to Visit  Medication Sig Dispense Refill   amoxicillin (AMOXIL) 500 MG tablet Take 2,000 mg by mouth once. Prior to dental appointments     apixaban (ELIQUIS) 5 MG TABS tablet Take 1 tablet (5 mg total) by mouth 2 (two) times daily. 90 tablet 3   baclofen (LIORESAL) 10 MG tablet Take 5 mg by mouth 2 (two) times daily as needed (Headache).     brimonidine-timolol (COMBIGAN) 0.2-0.5 % ophthalmic solution Place 1 drop into both eyes every 12 (twelve) hours.     carboxymethylcellulose (REFRESH PLUS) 0.5 % SOLN Place 1 drop into both eyes 3 (three) times daily as needed (Dry eyes). RETAIN eye drops instead of refresh     cholecalciferol (VITAMIN D) 25 MCG (1000 UNIT) tablet Take 1,000 Units by mouth daily.     clorazepate (TRANXENE) 7.5 MG tablet Take 7.5 mg by mouth 2 (two) times daily. Take additional 7.5 mg if needed for Anxiety     desvenlafaxine (PRISTIQ) 100 MG 24 hr tablet Take 100 mg by mouth at bedtime.     diltiazem (CARDIZEM CD) 240 MG 24 hr capsule Take 240 mg by mouth daily.     ferrous sulfate 325 (65 FE) MG tablet Take 325 mg by mouth daily with breakfast.     flecainide (TAMBOCOR) 100 MG tablet Take 1 tablet (100 mg total) by mouth 2 (two) times daily. 60 tablet 3   hydrALAZINE (APRESOLINE) 100 MG tablet Take 1 tablet (100 mg total) by mouth every 8 (eight) hours.      latanoprost (XALATAN) 0.005 % ophthalmic solution Place 1 drop into both eyes nightly.     Melatonin 5 MG TABS Take 10 mg by mouth at bedtime.     Multiple Vitamins-Minerals (MULTIVITAMIN WITH MINERALS) tablet Take 1 tablet by mouth daily.     Omega 3 1200 MG CAPS Take 2,400 mg by mouth at bedtime.     PREVIDENT 5000 DRY MOUTH 1.1 % GEL dental gel Place 1 application. onto teeth daily.     zaleplon (SONATA) 10  MG capsule Take 20 mg by mouth at bedtime.     No current facility-administered medications on file prior to visit.     Allergies:   Fluoxetine hcl, Sulfa antibiotics, Chocolate, Floxin [ocuflox], Other, and Sulfonamide derivatives   Social History   Socioeconomic History   Marital status: Widowed    Spouse name: Not on file   Number of children: Not on file   Years of education: Not on file   Highest education level: Not on file  Occupational History   Occupation: Designer, jewellery  Tobacco Use   Smoking status: Never   Smokeless tobacco: Never   Tobacco comments:    Never smoke 12/25/21  Vaping Use   Vaping Use: Never used  Substance and Sexual Activity   Alcohol use: Yes    Alcohol/week: 1.0 - 2.0 standard drink of alcohol    Types: 1 - 2 Standard drinks or equivalent per week   Drug use: No   Sexual activity: Not Currently    Birth control/protection: Post-menopausal    Comment: 1st intercourse 59 yo-1 partner  Other Topics Concern   Not on file  Social History Narrative   Lives with 4 cats   Social Determinants of Health   Financial Resource Strain: Not on file  Food Insecurity: Not on file  Transportation Needs: Not on file  Physical Activity: Not on file  Stress: Not on file  Social Connections: Not on file     Family History: The patient's family history includes Asthma in her sister; Diabetes in her mother; Gout in her sister; Heart disease (age of onset: 66) in her father; Hypertension in her father, mother, and sister; Stroke in her mother. There  is no history of Colon cancer, Breast cancer, Esophageal cancer, Stomach cancer, Rectal cancer, or Colon polyps.  ROS:   Please see the history of present illness.     All other systems reviewed and are negative.  EKGs/Labs/Other Studies Reviewed:    The following studies were reviewed today:   EKG:  EKG is  ordered today.  The ekg ordered today demonstrates   11/23/2021-Rate controlled atrial fibrillation   02/22/2022- atrial fibrillation rate 80  Recent Labs: 10/10/2021: ALT 38; TSH 2.68 03/13/2022: BUN 29; Creatinine, Ser 1.23; Hemoglobin 14.4; Platelets 205; Potassium 3.4; Sodium 134    Recent Lipid Panel    Component Value Date/Time   CHOL 110 10/10/2021 1013   TRIG 56.0 10/10/2021 1013   HDL 56.20 10/10/2021 1013   CHOLHDL 2 10/10/2021 1013   VLDL 11.2 10/10/2021 1013   LDLCALC 43 10/10/2021 1013   LDLDIRECT 50.0 09/24/2017 1614     Risk Assessment/Calculations:    CHA2DS2-VASc Score = 3   This indicates a 3.2% annual risk of stroke. The patient's score is based upon: CHF History: 0 HTN History: 1 Diabetes History: 0 Stroke History: 0 Vascular Disease History: 0 Age Score: 1 Gender Score: 1          Physical Exam:    VS:    Vitals:   03/16/22 0816  BP: 138/82  Pulse: 88  SpO2: 98%     Wt Readings from Last 3 Encounters:  03/16/22 197 lb 3.2 oz (89.4 kg)  03/13/22 198 lb (89.8 kg)  02/22/22 197 lb 6.4 oz (89.5 kg)     GEN:  Well nourished, well developed in no acute distress HEENT: Normal NECK: No JVD; No carotid bruits LYMPHATICS: No lymphadenopathy CARDIAC: irregularly irregular no murmurs, rubs, gallops RESPIRATORY:  Clear to auscultation  without rales, wheezing or rhonchi  ABDOMEN: Soft, non-tender, non-distended MUSCULOSKELETAL:  No edema; No deformity  SKIN: Warm and dry NEUROLOGIC:  Alert and oriented x 3 PSYCHIATRIC:  Normal affect   ASSESSMENT:    Persistent Atrial Fibrillation: New Onset. CHADS2VASC 3. It is challenging  maintaining her in sinus rhythm. Her LA is severely dilated. Highly encouraged continuing her CPAP with the eye protector for OSA which is contributing to the challenge with keeping her in rhythm.  She is asymptomatic. Can continue this regimen for now, if remains in afib despite management of OSA/and/or has symptoms. Would consider PVI. She has sarcoidosis, would like to avoid amiodarone if feasible. Otherwise, she develops significant conduction dx, would get CMR to assess for cardiac sarcoid in which case an ICD would be indicated. - FU afib clinic -continue diltiazem 240 mg daily  -continue eliquis 5 mg BID -continue flecainide 50 mg BID, followed by Afib clinic (no significant change in QRS width on prior visit); remains in afib,no palpitations or significant symptoms, will continue for now and can reassess in afib clinic if she remains in afib   HTN- well controlled. Continue current regimen above  CKD Stage III- GFR 48. Will hold lasix 40 mg BID with dehydration and no congestive HF  PLAN:    In order of problems listed above:  Follow up 6 months; if requested or she desires earlier appointment, can be seen by an APP Hold lasix Received a message about valbenazine from Dr. [name], she can take this medication from a cardiac perspective      Medication Adjustments/Labs and Tests Ordered: Current medicines are reviewed at length with the patient today.  Concerns regarding medicines are outlined above.  No orders of the defined types were placed in this encounter.  No orders of the defined types were placed in this encounter.   Patient Instructions  Medication Instructions:  Your physician has recommended you make the following change in your medication:  Stop: Lasix  *If you need a refill on your cardiac medications before your next appointment, please call your pharmacy*   Lab Work: None    Testing/Procedures: None   Follow-Up: At [facility], you and your  health needs are our priority.  As part of our continuing mission to provide you with exceptional heart care, we have created designated Provider Care Teams.  These Care Teams include your primary Cardiologist (physician) and Advanced Practice Providers (APPs -  Physician Assistants and Nurse Practitioners) who all work together to provide you with the care you need, when you need it.  We recommend signing up for the patient portal called "MyChart".  Sign up information is provided on this After Visit Summary.  MyChart is used to connect with patients for Virtual Visits (Telemedicine).  Patients are able to view lab/test results, encounter notes, upcoming appointments, etc.  Non-urgent messages can be sent to your provider as well.   To learn more about what you can do with MyChart, go to [email or web address].    Your next appointment:   Feb 15th at [place]  The format for your next appointment:   In Person  Provider:   Janina Mayo, MD     Other Instructions   Important Information About Sugar         Signed, Janina Mayo, MD  03/16/2022 8:36 AM    [place]

## 2022-03-18 ENCOUNTER — Encounter: Payer: Self-pay | Admitting: Internal Medicine

## 2022-03-18 DIAGNOSIS — N390 Urinary tract infection, site not specified: Secondary | ICD-10-CM | POA: Diagnosis not present

## 2022-03-18 DIAGNOSIS — N189 Chronic kidney disease, unspecified: Secondary | ICD-10-CM | POA: Diagnosis not present

## 2022-03-18 DIAGNOSIS — H532 Diplopia: Secondary | ICD-10-CM | POA: Diagnosis not present

## 2022-03-18 DIAGNOSIS — E860 Dehydration: Secondary | ICD-10-CM | POA: Diagnosis not present

## 2022-03-18 DIAGNOSIS — F32A Depression, unspecified: Secondary | ICD-10-CM | POA: Diagnosis not present

## 2022-03-18 DIAGNOSIS — R42 Dizziness and giddiness: Secondary | ICD-10-CM | POA: Diagnosis not present

## 2022-03-18 DIAGNOSIS — Z20822 Contact with and (suspected) exposure to covid-19: Secondary | ICD-10-CM | POA: Diagnosis not present

## 2022-03-18 DIAGNOSIS — E785 Hyperlipidemia, unspecified: Secondary | ICD-10-CM | POA: Diagnosis not present

## 2022-03-18 DIAGNOSIS — H5710 Ocular pain, unspecified eye: Secondary | ICD-10-CM | POA: Diagnosis not present

## 2022-03-18 DIAGNOSIS — Z9181 History of falling: Secondary | ICD-10-CM | POA: Diagnosis not present

## 2022-03-18 DIAGNOSIS — D869 Sarcoidosis, unspecified: Secondary | ICD-10-CM | POA: Diagnosis not present

## 2022-03-18 DIAGNOSIS — I4819 Other persistent atrial fibrillation: Secondary | ICD-10-CM | POA: Diagnosis not present

## 2022-03-18 DIAGNOSIS — I10 Essential (primary) hypertension: Secondary | ICD-10-CM | POA: Diagnosis not present

## 2022-03-18 DIAGNOSIS — G4733 Obstructive sleep apnea (adult) (pediatric): Secondary | ICD-10-CM | POA: Diagnosis not present

## 2022-03-18 DIAGNOSIS — I129 Hypertensive chronic kidney disease with stage 1 through stage 4 chronic kidney disease, or unspecified chronic kidney disease: Secondary | ICD-10-CM | POA: Diagnosis not present

## 2022-03-18 DIAGNOSIS — F419 Anxiety disorder, unspecified: Secondary | ICD-10-CM | POA: Diagnosis not present

## 2022-03-18 DIAGNOSIS — G2401 Drug induced subacute dyskinesia: Secondary | ICD-10-CM | POA: Diagnosis not present

## 2022-03-18 DIAGNOSIS — Z7901 Long term (current) use of anticoagulants: Secondary | ICD-10-CM | POA: Diagnosis not present

## 2022-03-18 DIAGNOSIS — R778 Other specified abnormalities of plasma proteins: Secondary | ICD-10-CM | POA: Diagnosis not present

## 2022-03-18 NOTE — Assessment & Plan Note (Signed)
Willing to try CPAP again, but also interested in [place]. Plan- referring to Dr Dorien Chihuahua al to follow-up on Inspire Plan- new CPAP auto 5-20. Wear eye shield to keep air out of eyes and use lubricating gel.

## 2022-03-18 NOTE — Assessment & Plan Note (Signed)
[place] with Cardiology. Understands goal of OSA control with AFib.

## 2022-03-19 DIAGNOSIS — I951 Orthostatic hypotension: Secondary | ICD-10-CM | POA: Diagnosis not present

## 2022-03-19 DIAGNOSIS — I10 Essential (primary) hypertension: Secondary | ICD-10-CM | POA: Diagnosis not present

## 2022-03-19 DIAGNOSIS — G2401 Drug induced subacute dyskinesia: Secondary | ICD-10-CM | POA: Diagnosis not present

## 2022-03-19 DIAGNOSIS — R42 Dizziness and giddiness: Secondary | ICD-10-CM | POA: Diagnosis not present

## 2022-03-20 ENCOUNTER — Ambulatory Visit: Payer: BC Managed Care – PPO | Admitting: Internal Medicine

## 2022-03-20 DIAGNOSIS — Z9989 Dependence on other enabling machines and devices: Secondary | ICD-10-CM | POA: Diagnosis not present

## 2022-03-20 DIAGNOSIS — R55 Syncope and collapse: Secondary | ICD-10-CM | POA: Diagnosis not present

## 2022-03-20 DIAGNOSIS — G2401 Drug induced subacute dyskinesia: Secondary | ICD-10-CM | POA: Diagnosis not present

## 2022-03-20 DIAGNOSIS — G4733 Obstructive sleep apnea (adult) (pediatric): Secondary | ICD-10-CM | POA: Diagnosis not present

## 2022-03-20 DIAGNOSIS — I4891 Unspecified atrial fibrillation: Secondary | ICD-10-CM | POA: Diagnosis not present

## 2022-03-22 DIAGNOSIS — I4891 Unspecified atrial fibrillation: Secondary | ICD-10-CM | POA: Diagnosis not present

## 2022-03-22 DIAGNOSIS — R55 Syncope and collapse: Secondary | ICD-10-CM | POA: Diagnosis not present

## 2022-03-24 ENCOUNTER — Other Ambulatory Visit: Payer: Self-pay

## 2022-03-24 ENCOUNTER — Emergency Department (HOSPITAL_COMMUNITY)
Admission: EM | Admit: 2022-03-24 | Discharge: 2022-03-25 | Disposition: A | Discharge status: Home / Self Care | Payer: BC Managed Care – PPO | Attending: Emergency Medicine | Admitting: Emergency Medicine

## 2022-03-24 ENCOUNTER — Emergency Department (HOSPITAL_COMMUNITY): Payer: BC Managed Care – PPO

## 2022-03-24 DIAGNOSIS — R251 Tremor, unspecified: Secondary | ICD-10-CM | POA: Diagnosis not present

## 2022-03-24 DIAGNOSIS — R42 Dizziness and giddiness: Secondary | ICD-10-CM | POA: Diagnosis not present

## 2022-03-24 DIAGNOSIS — F419 Anxiety disorder, unspecified: Secondary | ICD-10-CM | POA: Insufficient documentation

## 2022-03-24 DIAGNOSIS — R0602 Shortness of breath: Secondary | ICD-10-CM | POA: Diagnosis not present

## 2022-03-24 DIAGNOSIS — R531 Weakness: Secondary | ICD-10-CM | POA: Diagnosis not present

## 2022-03-24 DIAGNOSIS — R519 Headache, unspecified: Secondary | ICD-10-CM | POA: Insufficient documentation

## 2022-03-24 DIAGNOSIS — Z7901 Long term (current) use of anticoagulants: Secondary | ICD-10-CM | POA: Diagnosis not present

## 2022-03-24 DIAGNOSIS — I10 Essential (primary) hypertension: Secondary | ICD-10-CM | POA: Insufficient documentation

## 2022-03-24 LAB — COMPREHENSIVE METABOLIC PANEL
ALT: 19 U/L (ref 0–44)
AST: 29 U/L (ref 15–41)
Albumin: 3.7 g/dL (ref 3.5–5.0)
Alkaline Phosphatase: 111 U/L (ref 38–126)
Anion gap: 6 (ref 5–15)
BUN: 19 mg/dL (ref 8–23)
CO2: 26 mmol/L (ref 22–32)
Calcium: 9 mg/dL (ref 8.9–10.3)
Chloride: 104 mmol/L (ref 98–111)
Creatinine, Ser: 1.13 mg/dL — ABNORMAL HIGH (ref 0.44–1.00)
GFR, Estimated: 53 mL/min — ABNORMAL LOW (ref >60)
Glucose, Bld: 109 mg/dL — ABNORMAL HIGH (ref 70–99)
Potassium: 4 mmol/L (ref 3.5–5.1)
Sodium: 136 mmol/L (ref 135–145)
Total Bilirubin: 1 mg/dL (ref 0.3–1.2)
Total Protein: 7.2 g/dL (ref 6.5–8.1)

## 2022-03-24 LAB — CBC WITH DIFFERENTIAL/PLATELET
Abs Immature Granulocytes: 0.02 10*3/uL (ref 0.00–0.07)
Basophils Absolute: 0.1 10*3/uL (ref 0.0–0.1)
Basophils Relative: 1 %
Eosinophils Absolute: 0.3 10*3/uL (ref 0.0–0.5)
Eosinophils Relative: 3 %
HCT: 47.1 % — ABNORMAL HIGH (ref 36.0–46.0)
Hemoglobin: 15 g/dL (ref 12.0–15.0)
Immature Granulocytes: 0 %
Lymphocytes Relative: 21 %
Lymphs Abs: 1.7 10*3/uL (ref 0.7–4.0)
MCH: 28.8 pg (ref 26.0–34.0)
MCHC: 31.8 g/dL (ref 30.0–36.0)
MCV: 90.4 fL (ref 80.0–100.0)
Monocytes Absolute: 0.8 10*3/uL (ref 0.1–1.0)
Monocytes Relative: 10 %
Neutro Abs: 5.1 10*3/uL (ref 1.7–7.7)
Neutrophils Relative %: 65 %
Platelets: 237 10*3/uL (ref 150–400)
RBC: 5.21 MIL/uL — ABNORMAL HIGH (ref 3.87–5.11)
RDW: 15.1 % (ref 11.5–15.5)
WBC: 8 10*3/uL (ref 4.0–10.5)
nRBC: 0 % (ref 0.0–0.2)

## 2022-03-24 LAB — TROPONIN I (HIGH SENSITIVITY): Troponin I (High Sensitivity): 31 ng/L — ABNORMAL HIGH (ref <18)

## 2022-03-24 MED ORDER — DILTIAZEM HCL ER COATED BEADS 120 MG PO CP24
240.0000 mg | ORAL_CAPSULE | Freq: Once | ORAL | Status: AC
  Administered 2022-03-25: 240 mg via ORAL
  Filled 2022-03-24: qty 2

## 2022-03-24 MED ORDER — HYDRALAZINE HCL 25 MG PO TABS
100.0000 mg | ORAL_TABLET | Freq: Once | ORAL | Status: AC
  Administered 2022-03-25: 100 mg via ORAL
  Filled 2022-03-24: qty 4

## 2022-03-24 NOTE — Discharge Instructions (Signed)
Please follow up with your PCP and take your BP medication as prescribed. Return to ER if symptoms are worsening, if you develop unilateral weakness, changes in speech, difficulty swallowing, or vision changes.

## 2022-03-24 NOTE — ED Provider Notes (Signed)
[facility] DEPT Provider Note   CSN: [id number] Arrival date & time: 03/24/22  2134     History  Chief Complaint  Patient presents with   Headache   Hypertension    Tara [name] is a 71 y.o. female, hx of afib and hypertension, presents to the ED secondary to dizziness since 6:30 PM.  She states that it started when she went out to dinner, she felt very dizzy, lightheaded, and weak, states that she was a little short of breath as well, states that this has been persistent, and when she was at dinner she started drooling from the middle of her mouth.  She states her speech is a little bit altered, and she feels unwell.  Denies any chest pain, nausea, vomiting, diarrhea.  Does states she has a history of A-fib, but does not know whether she is in and out of A-fib, or constantly in A-fib.  Has had to be cardioverted twice.  Her cardiologist is Dr. Harl Bowie.   She states the drooping of her left upper lip has been present for the last 2-3 years.  Headache Associated symptoms: dizziness   Hypertension       Home Medications Prior to Admission medications   Medication Sig Start Date End Date Taking? Authorizing Provider  amoxicillin (AMOXIL) 500 MG tablet Take 2,000 mg by mouth once. Prior to dental appointments 08/03/19   [provider]  apixaban (ELIQUIS) 5 MG TABS tablet Take 1 tablet (5 mg total) by mouth 2 (two) times daily. 11/23/21   Janina Mayo, MD  baclofen (LIORESAL) 10 MG tablet Take 5 mg by mouth 2 (two) times daily as needed (Headache). 09/01/19   [provider]  brimonidine-timolol (COMBIGAN) 0.2-0.5 % ophthalmic solution Place 1 drop into both eyes every 12 (twelve) hours. 12/02/19   [provider]  carboxymethylcellulose (REFRESH PLUS) 0.5 % SOLN Place 1 drop into both eyes 3 (three) times daily as needed (Dry eyes). RETAIN eye drops instead of refresh    [provider]  cholecalciferol (VITAMIN D) 25  MCG (1000 UNIT) tablet Take 1,000 Units by mouth daily.    [provider]  clorazepate (TRANXENE) 7.5 MG tablet Take 7.5 mg by mouth 2 (two) times daily. Take additional 7.5 mg if needed for Anxiety    [provider]  desvenlafaxine (PRISTIQ) 100 MG 24 hr tablet Take 100 mg by mouth at bedtime.    [provider]  diltiazem (CARDIZEM CD) 240 MG 24 hr capsule Take 1 capsule (240 mg total) by mouth daily. 03/16/22   Biagio Borg, MD  ferrous sulfate 325 (65 FE) MG tablet Take 325 mg by mouth daily with breakfast.    [provider]  flecainide (TAMBOCOR) 100 MG tablet Take 1 tablet (100 mg total) by mouth 2 (two) times daily. 03/09/22   Fenton, Clint R, PA  hydrALAZINE (APRESOLINE) 100 MG tablet Take 1 tablet (100 mg total) by mouth every 8 (eight) hours. 12/22/15   Hosie Poisson, MD  latanoprost (XALATAN) 0.005 % ophthalmic solution Place 1 drop into both eyes nightly. 08/25/16   [provider]  Melatonin 5 MG TABS Take 10 mg by mouth at bedtime.    [provider]  Multiple Vitamins-Minerals (MULTIVITAMIN WITH MINERALS) tablet Take 1 tablet by mouth daily.    [provider]  Omega 3 1200 MG CAPS Take 2,400 mg by mouth at bedtime.    [provider]  PREVIDENT 5000 DRY MOUTH 1.1 %  GEL dental gel Place 1 application. onto teeth daily. 09/22/19   [provider]  zaleplon (SONATA) 10 MG capsule Take 20 mg by mouth at bedtime.    [provider]      Allergies    Fluoxetine hcl, Sulfa antibiotics, Chocolate, Floxin [ocuflox], Other, and Sulfonamide derivatives    Review of Systems   Review of Systems  HENT:  Positive for drooling.   Neurological:  Positive for dizziness.    Physical Exam Updated Vital Signs There were no vitals taken for this visit. Physical Exam Vitals and nursing note reviewed.  Constitutional:      Appearance: Normal appearance.  HENT:     Head: Normocephalic and atraumatic.      Right Ear: Tympanic membrane normal.     Left Ear: Tympanic membrane normal.     Nose: Nose normal.     Mouth/Throat:     Mouth: Mucous membranes are moist.  Eyes:     Extraocular Movements: Extraocular movements intact.     Conjunctiva/sclera: Conjunctivae normal.     Pupils: Pupils are equal, round, and reactive to light.  Cardiovascular:     Rate and Rhythm: Rhythm irregular.  Pulmonary:     Effort: Pulmonary effort is normal.     Breath sounds: Normal breath sounds.  Abdominal:     General: Abdomen is flat. Bowel sounds are normal.     Palpations: Abdomen is soft.  Musculoskeletal:        General: Normal range of motion.     Cervical back: Normal range of motion and neck supple.  Skin:    General: Skin is warm and dry.     Capillary Refill: Capillary refill takes less than 2 seconds.  Neurological:     General: No focal deficit present.     Mental Status: She is alert and oriented to person, place, and time.     Cranial Nerves: Facial asymmetry present.     Motor: Tremor present.     Comments: +drooping of left upper lip, 5/5 strength in BUE, BLE, normal finger to nose, normal heel to shin  Psychiatric:        Mood and Affect: Mood is anxious.        Thought Content: Thought content normal.     ED Results / Procedures / Treatments   Labs (all labs ordered are listed, but only abnormal results are displayed) Labs Reviewed - No data to display  EKG None  Radiology No results found.  Procedures Procedures    Medications Ordered in ED Medications - No data to display  ED Course/ Medical Decision Making/ A&P Clinical Course as of 03/24/22 2331  Sat Mar 24, 2022  2315 DG Chest 2 View [BS]    Clinical Course User Index [BS] Delbert Darley, Si Gaul, PA                           Medical Decision Making Amount and/or Complexity of Data Reviewed Labs: ordered. Radiology: ordered. Decision-making details documented in ED Course.  Risk Prescription drug  management.   Patient is a 71 year old female, she was here for similar symptoms about a week ago, and her work-up was negative.  Head CT is unremarkable, EKG shows A-fib, however it is rate controlled in the 70s and she has known A-fib.  Chest x-ray was unremarkable. UA, second Trop pending at this time.  Her blood pressure was elevated, however patient did not take her Cardizem,  or night hydralazine, thus this was administered to her while she was in the ER.  Recheck of blood pressure is pending.   Care signed out to Quincy Carnes pending on follow-up of delta troponin and urinalysis, pending complete likely DC home.  Final Clinical Impression(s) / ED Diagnoses Final diagnoses:  None    Rx / DC Orders ED Discharge Orders     None         Elleen Coulibaly, Si Gaul, PA 03/24/22 2346    Lennice Sites, DO 03/25/22 1551

## 2022-03-24 NOTE — ED Triage Notes (Signed)
Pt BIB EMS from home.  Pt states she has had continued HA and dizziness since last week when she was seen for similar symptoms.  Pt has hx of afib and is in controlled afib at this time.

## 2022-03-25 LAB — URINALYSIS, ROUTINE W REFLEX MICROSCOPIC
Bilirubin Urine: NEGATIVE
Glucose, UA: NEGATIVE mg/dL
Hgb urine dipstick: NEGATIVE
Ketones, ur: NEGATIVE mg/dL
Leukocytes,Ua: NEGATIVE
Nitrite: NEGATIVE
Protein, ur: NEGATIVE mg/dL
Specific Gravity, Urine: 1.004 — ABNORMAL LOW (ref 1.005–1.030)
pH: 7 (ref 5.0–8.0)

## 2022-03-25 LAB — TROPONIN I (HIGH SENSITIVITY): Troponin I (High Sensitivity): 35 ng/L — ABNORMAL HIGH (ref <18)

## 2022-03-25 NOTE — ED Provider Notes (Signed)
  Delta trop flat (31, delta 35).  BP has improved after dose of home meds.  UA without signs of infection.  Stable for discharge as per prior team.  Will need to follow-up with PCP.  Return precautions given.   Larene Pickett, PA-C 03/25/22 0228    Maudie Flakes, MD 03/25/22 [phone]

## 2022-03-26 ENCOUNTER — Ambulatory Visit (HOSPITAL_COMMUNITY)
Admission: RE | Admit: 2022-03-26 | Discharge: 2022-03-26 | Disposition: A | Discharge status: Home / Self Care | Payer: BC Managed Care – PPO | Source: Ambulatory Visit | Attending: Physician Assistant | Admitting: Physician Assistant

## 2022-03-26 VITALS — BP 154/82 | HR 74 | Ht 66.0 in | Wt 195.6 lb

## 2022-03-26 DIAGNOSIS — I129 Hypertensive chronic kidney disease with stage 1 through stage 4 chronic kidney disease, or unspecified chronic kidney disease: Secondary | ICD-10-CM | POA: Insufficient documentation

## 2022-03-26 DIAGNOSIS — R9431 Abnormal electrocardiogram [ECG] [EKG]: Secondary | ICD-10-CM | POA: Insufficient documentation

## 2022-03-26 DIAGNOSIS — Z7901 Long term (current) use of anticoagulants: Secondary | ICD-10-CM | POA: Insufficient documentation

## 2022-03-26 DIAGNOSIS — Z713 Dietary counseling and surveillance: Secondary | ICD-10-CM | POA: Diagnosis not present

## 2022-03-26 DIAGNOSIS — R42 Dizziness and giddiness: Secondary | ICD-10-CM | POA: Insufficient documentation

## 2022-03-26 DIAGNOSIS — G4733 Obstructive sleep apnea (adult) (pediatric): Secondary | ICD-10-CM | POA: Diagnosis not present

## 2022-03-26 DIAGNOSIS — E669 Obesity, unspecified: Secondary | ICD-10-CM | POA: Insufficient documentation

## 2022-03-26 DIAGNOSIS — I4819 Other persistent atrial fibrillation: Secondary | ICD-10-CM | POA: Diagnosis not present

## 2022-03-26 DIAGNOSIS — Z6831 Body mass index (BMI) 31.0-31.9, adult: Secondary | ICD-10-CM | POA: Diagnosis not present

## 2022-03-26 DIAGNOSIS — N183 Chronic kidney disease, stage 3 unspecified: Secondary | ICD-10-CM | POA: Insufficient documentation

## 2022-03-26 DIAGNOSIS — Z79899 Other long term (current) drug therapy: Secondary | ICD-10-CM | POA: Diagnosis not present

## 2022-03-26 DIAGNOSIS — D6869 Other thrombophilia: Secondary | ICD-10-CM | POA: Diagnosis not present

## 2022-03-26 DIAGNOSIS — R55 Syncope and collapse: Secondary | ICD-10-CM | POA: Diagnosis not present

## 2022-03-26 NOTE — Progress Notes (Signed)
Primary Care Physician: Biagio Borg, MD Primary Cardiologist: Dr Phineas Inches Primary Electrophysiologist: none Referring Physician: Dr Sheliah Hatch is a 71 y.o. female with a history of CKD, sarcoidosis, HTN, OSA, atrial fibrillation who presents for follow up in the [facility].  She was seen by GI for surveillance colonoscopy and during her colonoscopy noted to be in afib. She admits she had been more tired than usual and had some intermittent lightheadedness. Patient is on Eliquis for a CHADS2VASC score of 3. She underwent DCCV on 12/15/21 which was unsuccessful. She denies significant alcohol use. She was started on flecainide and underwent DCCV on 02/09/22 but was back in afib on follow up 02/22/22. She has been to the ED three times since with dizziness/presyncope, was kept overnight at [facility], felt to be due to dehydration. Patient does report some occasional rapid heart rates which have NOT been typical of her afib. She denies frank syncope. She is dizzy in office today, ECG shows rate controlled afib. Dizziness improved after resting on exam table.   Today, she denies symptoms of chest pain, shortness of breath, orthopnea, PND, lower extremity edema, syncope, bleeding, or neurologic sequela. The patient is tolerating medications without difficulties and is otherwise without complaint today.    Atrial Fibrillation Risk Factors:  she does have symptoms or diagnosis of sleep apnea. she is compliant with CPAP therapy. she does not have a history of rheumatic fever. she does not have a history of alcohol use. The patient does have a history of early familial atrial fibrillation or other arrhythmias. Father has afib.  she has a BMI of Body mass index is 31.57 kg/m.Marland Kitchen Filed Weights   03/26/22 1144  Weight: 88.7 kg     Family History  Problem Relation Age of Onset   Diabetes Mother    Hypertension Mother    Stroke Mother    Heart  disease Father 54   Hypertension Father    Hypertension Sister    Asthma Sister    Gout Sister    Colon cancer Neg Hx    Breast cancer Neg Hx    Esophageal cancer Neg Hx    Stomach cancer Neg Hx    Rectal cancer Neg Hx    Colon polyps Neg Hx      Atrial Fibrillation Management history:  Previous antiarrhythmic drugs: flecainide  Previous cardioversions: 12/15/21, 02/09/22 Previous ablations: none CHADS2VASC score: 3 Anticoagulation history: Eliquis   Past Medical History:  Diagnosis Date   Anemia    on meds   ANKLE PAIN, LEFT 04/01/2008   ANXIETY 04/17/2007   on meds   Arthritis    generalized   Cataract    bilateral sx   Chronic kidney disease    stage 3   Colon polyp    COLONIC POLYPS, HX OF 08/01/2007   CONTUSIONS, MULTIPLE 04/01/2009   DEPRESSION 04/17/2007   on meds   DIZZINESS 08/01/2007   DYSPNEA 08/01/2007   with exertion   Enlargement of lymph nodes 08/13/2007   Excessive involuntary blinking    per pt,going on since 2018   Glaucoma    on meds   GLUCOSE INTOLERANCE 08/01/2007   Hypercalcemia due to sarcoidosis 2014   HYPERLIPIDEMIA 08/01/2007   no meds   HYPERSOMNIA 07/28/2008   HYPERTENSION 04/17/2007   Impaired glucose tolerance 03/23/2011   JOINT EFFUSION, LEFT KNEE 06/02/2010   Loose body in knee 04/01/2009   pt not sure?  Metabolic encephalopathy 72/82/0601-56/1537   Migraines    "stopped 3-4 yr ago" (07/23/2012)   Morbid obesity ([place]) 04/20/2007   OTHER DISEASES OF LUNG NOT ELSEWHERE CLASSIFIED 08/01/2007   Pain in joint, lower leg 04/01/2009   PERIPHERAL EDEMA 04/21/2009   Pre-diabetes    Sarcoidosis 09/25/2007   Seasonal allergies    SHOULDER PAIN, LEFT 04/01/2009   Sleep apnea    does not use Cpap   Past Surgical History:  Procedure Laterality Date   BREAST SURGERY     Biopsy benign/bil breaST   CARDIOVERSION N/A 12/15/2021   Procedure: CARDIOVERSION;  Surgeon: Skeet Latch, MD;  Location: [facility];  Service:  Cardiovascular;  Laterality: N/A;   CARDIOVERSION N/A 02/09/2022   Procedure: CARDIOVERSION;  Surgeon: [name], MD;  Location: [facility];  Service: Cardiovascular;  Laterality: N/A;   CATARACT EXTRACTION     RIGHT EYE   COLONOSCOPY  2020   KN-MAC-suprep(good)-tics/TA's   COMBINED MEDIASTINOSCOPY AND BRONCHOSCOPY  08/2007   COMBINED MEDIASTINOSCOPY AND BRONCHOSCOPY  2009   Dental implant     FRACTURE SURGERY  ?02/1997   "left upper arm; put rod in" (07/23/2012)   GUM SURGERY  2000-?2009   "several ORs; soft tissue graft; took material from roof of mouth" (07/23/2012   HUMERUS SURGERY Left 1998   rod insertion   KNEE ARTHROSCOPY  03/2004; 06/2009   "right; left" Dr. Theda Sers   KNEE SURGERY  2012   ARTHROSCOPIC LEFT KNEE   LYMPH NODE BIOPSY  ~ 2009   "for sarcoidosis; don't know exactly which nodes" (07/23/2102)   [place]   Open   POLYPECTOMY  2020   TA's   REFRACTIVE SURGERY  08/1998   "both eyes" (07/23/2012)   [place]   Bil   TOTAL KNEE ARTHROPLASTY Left 09/24/2016   Procedure: TOTAL KNEE ARTHROPLASTY;  Surgeon: Dorna Leitz, MD;  Location: [facility];  Service: Orthopedics;  Laterality: Left;    Current Outpatient Medications  Medication Sig Dispense Refill   amoxicillin (AMOXIL) 500 MG tablet Take 2,000 mg by mouth once. Prior to dental appointments     antiseptic oral rinse (BIOTENE) LIQD Use as directed 15 mLs in the mouth or throat 3 (three)  times daily as needed.     apixaban (ELIQUIS) 5 MG TABS tablet Take 1 tablet (5 mg total) by mouth 2 (two) times daily. 90 tablet 3   baclofen (LIORESAL) 10 MG tablet Take 5 mg by mouth 2 (two) times daily as needed (Headache).     brimonidine-timolol (COMBIGAN) 0.2-0.5 % ophthalmic solution Place 1 drop into both eyes every 12 (twelve) hours.     carboxymethylcellulose (REFRESH PLUS) 0.5 % SOLN Place 1 drop into both eyes 3 (three) times daily as needed (Dry eyes). RETAIN eye drops instead of refresh      cholecalciferol (VITAMIN D) 25 MCG (1000 UNIT) tablet Take 1,000 Units by mouth daily.     clorazepate (TRANXENE) 7.5 MG tablet Take 7.5 mg by mouth 2 (two) times daily.     desvenlafaxine (PRISTIQ) 100 MG 24 hr tablet Take 100 mg by mouth at bedtime.     diltiazem (CARDIZEM CD) 240 MG 24 hr capsule Take 1 capsule (240 mg total) by mouth daily. 90 capsule 1   ferrous sulfate 325 (65 FE) MG tablet Take 325 mg by mouth daily with breakfast.     flecainide (TAMBOCOR) 100 MG tablet Take 1 tablet (100 mg total) by mouth 2 (two) times daily. [place]  tablet 3   hydrALAZINE (APRESOLINE) 100 MG tablet Take 100 mg by mouth 2 (two) times daily.     INGREZZA 40 MG capsule Take 40 mg by mouth daily.     latanoprost (XALATAN) 0.005 % ophthalmic solution Place 1 drop into both eyes at bedtime.     Melatonin 5 MG TABS Take 10 mg by mouth at bedtime.     Multiple Vitamins-Minerals (MULTIVITAMIN WITH MINERALS) tablet Take 1 tablet by mouth daily.     Omega 3 1200 MG CAPS Take 2,400 mg by mouth at bedtime.     PREVIDENT 5000 DRY MOUTH 1.1 % GEL dental gel Place 1 application. onto teeth daily.     zaleplon (SONATA) 10 MG capsule Take 20 mg by mouth at bedtime.     Erenumab-aooe (AIMOVIG) 70 MG/ML SOAJ Inject into the skin. (Patient not taking: Reported on 03/26/2022)     No current facility-administered medications for this encounter.    Allergies  Allergen Reactions   Fluoxetine Hcl Other (See Comments)    (PROZAC) Suicidal thoughts   Sulfa Antibiotics Rash   Chocolate Other (See Comments)    SOMETIMES CAUSES SEVERE HEADACHES   Floxin [Ocuflox] Anxiety    shaky   Other Nausea And Vomiting    INSTANT ICED TEA PACKETS   Sulfonamide Derivatives Rash    Social History   Socioeconomic History   Marital status: Widowed    Spouse name: Not on file   Number of children: Not on file   Years of education: Not on file   Highest education level: Not on file  Occupational History   Occupation: Designer, jewellery   Tobacco Use   Smoking status: Never   Smokeless tobacco: Never   Tobacco comments:    Never smoke 12/25/21  Vaping Use   Vaping Use: Never used  Substance and Sexual Activity   Alcohol use: Yes    Alcohol/week: 1.0 - 2.0 standard drink of alcohol    Types: 1 - 2 Standard drinks or equivalent per week   Drug use: No   Sexual activity: Not Currently    Birth control/protection: Post-menopausal    Comment: 1st intercourse 50 yo-1 partner  Other Topics Concern   Not on file  Social History Narrative   Lives with 4 cats   Social Determinants of Health   Financial Resource Strain: Not on file  Food Insecurity: Not on file  Transportation Needs: Not on file  Physical Activity: Not on file  Stress: Not on file  Social Connections: Not on file  Intimate Partner Violence: Not on file     ROS- All systems are reviewed and negative except as per the HPI above.  Physical Exam: Vitals:   03/26/22 1144  BP: (!) 154/82  Pulse: 74  Weight: 88.7 kg  Height: _0  (1.676 m)     GEN- The patient is a well appearing obese female, alert and oriented x 3 today.   HEENT-head normocephalic, atraumatic, sclera clear, conjunctiva pink, hearing intact, trachea midline. Lungs- Clear to ausculation bilaterally, normal work of breathing Heart- irregular rate and rhythm, no murmurs, rubs or gallops  GI- soft, NT, ND, + BS Extremities- no clubbing, cyanosis, or edema MS- no significant deformity or atrophy Skin- no rash or lesion Psych- euthymic mood, full affect Neuro- strength and sensation are intact   Wt Readings from Last 3 Encounters:  03/26/22 88.7 kg  03/16/22 89.4 kg  03/13/22 89.8 kg    EKG today demonstrates  Afib Vent.  rate 74 BPM PR interval * ms QRS duration 100 ms QT/QTcB 410/455 ms  Echo 01/11/22 demonstrated   1. Left ventricular ejection fraction, by estimation, is 55 to 60%. The  left ventricle has normal function. The left ventricle has no regional  wall  motion abnormalities. Left ventricular diastolic parameters are  indeterminate.   2. Right ventricular systolic function is normal. The right ventricular  size is normal. There is normal pulmonary artery systolic pressure.   3. Left atrial size was moderately dilated.   4. Right atrial size was mildly dilated.   5. The mitral valve is abnormal. Trivial mitral valve regurgitation. No  evidence of mitral stenosis.   6. The aortic valve is tricuspid. There is mild calcification of the  aortic valve. Aortic valve regurgitation is trivial. Aortic valve  sclerosis is present, with no evidence of aortic valve stenosis.   7. The inferior vena cava is normal in size with greater than 50%  respiratory variability, suggesting right atrial pressure of 3 mmHg.   Epic records are reviewed at length today  CHA2DS2-VASc Score = 3  The patient's score is based upon: CHF History: 0 HTN History: 1 Diabetes History: 0 Stroke History: 0 Vascular Disease History: 0 Age Score: 1 Gender Score: 1       ASSESSMENT AND PLAN: 1. Persistent Atrial Fibrillation (ICD10:  I48.19) The patient's CHA2DS2-VASc score is 3, indicating a 3.2% annual risk of stroke.   Patient appears to have failed flecainide. Will discontinue this today. We discussed alternate rhythm control options. She would like to discuss ablation with EP. Her LA is at least moderately dilated. She is on valbenazine which is QT prolonging if dofetilide is considered.  Continue diltiazem 240 mg daily Continue Eliquis 5 mg BID  2. Secondary Hypercoagulable State (ICD10:  D68.69) The patient is at significant risk for stroke/thromboembolism based upon her CHA2DS2-VASc Score of 3.  Continue Apixaban (Eliquis).   3. Obesity Body mass index is 31.57 kg/m. Lifestyle modification was discussed and encouraged including regular physical activity and weight reduction.  4. Obstructive sleep apnea Patient using CPAP with eye guard, looking at  [facility]  5. HTN Mildly elevated today, will not increase BP meds with dizziness.  6. Dizziness/presyncope Patient having frequent episodes of dizziness, no syncope. Has had tachypalpitations which have not been typical of her afib.  ? If she is having other arrhythmias. May need to consider workup for cardiac sarcoid.  Will place live Zio x 2 weeks to evaluate.    Follow up with EP to discuss possible ablation. Follow up with PCP as scheduled.    [facility] [address] [place], [place] [address] [phone] 03/26/2022 12:14 PM

## 2022-03-26 NOTE — Patient Instructions (Signed)
STOP Flecainide

## 2022-03-27 ENCOUNTER — Emergency Department (HOSPITAL_COMMUNITY)
Admission: EM | Admit: 2022-03-27 | Discharge: 2022-03-28 | Disposition: A | Discharge status: Home / Self Care | Payer: BC Managed Care – PPO | Attending: Emergency Medicine | Admitting: Emergency Medicine

## 2022-03-27 ENCOUNTER — Other Ambulatory Visit: Payer: Self-pay

## 2022-03-27 ENCOUNTER — Encounter (HOSPITAL_COMMUNITY): Payer: Self-pay

## 2022-03-27 DIAGNOSIS — G4489 Other headache syndrome: Secondary | ICD-10-CM | POA: Diagnosis not present

## 2022-03-27 DIAGNOSIS — R519 Headache, unspecified: Secondary | ICD-10-CM | POA: Diagnosis not present

## 2022-03-27 DIAGNOSIS — Z8679 Personal history of other diseases of the circulatory system: Secondary | ICD-10-CM | POA: Diagnosis not present

## 2022-03-27 DIAGNOSIS — N183 Chronic kidney disease, stage 3 unspecified: Secondary | ICD-10-CM | POA: Insufficient documentation

## 2022-03-27 DIAGNOSIS — I129 Hypertensive chronic kidney disease with stage 1 through stage 4 chronic kidney disease, or unspecified chronic kidney disease: Secondary | ICD-10-CM | POA: Insufficient documentation

## 2022-03-27 DIAGNOSIS — R531 Weakness: Secondary | ICD-10-CM | POA: Diagnosis not present

## 2022-03-27 DIAGNOSIS — I10 Essential (primary) hypertension: Secondary | ICD-10-CM | POA: Diagnosis not present

## 2022-03-27 DIAGNOSIS — R42 Dizziness and giddiness: Secondary | ICD-10-CM | POA: Diagnosis not present

## 2022-03-27 NOTE — ED Triage Notes (Signed)
BIBA for headache every other day x2 weeks,  Denies light sensitivity although wearing sunglasses. Recently seen for same Denies n/v

## 2022-03-27 NOTE — ED Provider Triage Note (Signed)
Emergency Medicine Provider Triage Evaluation Note  Tara [name] , a 71 y.o. female  was evaluated in triage.  Pt complains of headache.  She has headaches approximately every other day.  States that when she goes to bed at night she usually wakes up and the headache is gone.  States this time the headache has lasted longer than they usually do.  Was at a restaurant and was not speaking as much as she typically does due to the pain so family members requested that she be seen in the ED.  Not the worst headache of her life.  Not a thunderclap headache.  Diffuse distribution of pain in the head.  No numbness, weakness or tingling anywhere.  No vision or hearing changes..  Review of Systems  Positive: As above Negative: As above, no nausea or vomiting  Physical Exam  There were no vitals taken for this visit. Gen:   Awake, no distress   Resp:  Normal effort  MSK:   Moves extremities without difficulty  Other:  Pupils equal and reactive, no facial asymmetry, no slurred speech, no pronator drift, no unilateral or global weakness, heel-to-shin normal  Medical Decision Making  Medically screening exam initiated at 8:43 PM.  Appropriate orders placed.  Tara [name] was informed that the remainder of the evaluation will be completed by another provider, this initial triage assessment does not replace that evaluation, and the importance of remaining in the ED until their evaluation is complete.  Patient had a CT head 2 days ago.  This headache is similar to headaches she has every other day, is only lasting longer.  Has not tried anything for the headache at home.  No neuro deficits.   Roylene Reason, [place] 03/27/22 2052

## 2022-03-28 ENCOUNTER — Emergency Department (HOSPITAL_COMMUNITY): Payer: BC Managed Care – PPO

## 2022-03-28 DIAGNOSIS — Z8679 Personal history of other diseases of the circulatory system: Secondary | ICD-10-CM | POA: Diagnosis not present

## 2022-03-28 LAB — COMPREHENSIVE METABOLIC PANEL
ALT: 21 U/L (ref 0–44)
AST: 21 U/L (ref 15–41)
Albumin: 3.8 g/dL (ref 3.5–5.0)
Alkaline Phosphatase: 98 U/L (ref 38–126)
Anion gap: 8 (ref 5–15)
BUN: 11 mg/dL (ref 8–23)
CO2: 28 mmol/L (ref 22–32)
Calcium: 9.4 mg/dL (ref 8.9–10.3)
Chloride: 104 mmol/L (ref 98–111)
Creatinine, Ser: 0.95 mg/dL (ref 0.44–1.00)
GFR, Estimated: >60 mL/min (ref >60)
Glucose, Bld: 94 mg/dL (ref 70–99)
Potassium: 3.3 mmol/L — ABNORMAL LOW (ref 3.5–5.1)
Sodium: 140 mmol/L (ref 135–145)
Total Bilirubin: 0.8 mg/dL (ref 0.3–1.2)
Total Protein: 7.2 g/dL (ref 6.5–8.1)

## 2022-03-28 LAB — CBC
HCT: 46.5 % — ABNORMAL HIGH (ref 36.0–46.0)
Hemoglobin: 15.3 g/dL — ABNORMAL HIGH (ref 12.0–15.0)
MCH: 28.7 pg (ref 26.0–34.0)
MCHC: 32.9 g/dL (ref 30.0–36.0)
MCV: 87.1 fL (ref 80.0–100.0)
Platelets: 231 10*3/uL (ref 150–400)
RBC: 5.34 MIL/uL — ABNORMAL HIGH (ref 3.87–5.11)
RDW: 15 % (ref 11.5–15.5)
WBC: 7.8 10*3/uL (ref 4.0–10.5)
nRBC: 0 % (ref 0.0–0.2)

## 2022-03-28 MED ORDER — SODIUM CHLORIDE (PF) 0.9 % IJ SOLN
INTRAMUSCULAR | Status: AC
  Filled 2022-03-28: qty 50

## 2022-03-28 MED ORDER — SODIUM CHLORIDE 0.9 % IV BOLUS
1000.0000 mL | Freq: Once | INTRAVENOUS | Status: AC
  Administered 2022-03-28: 1000 mL via INTRAVENOUS

## 2022-03-28 MED ORDER — PROCHLORPERAZINE EDISYLATE 10 MG/2ML IJ SOLN
10.0000 mg | Freq: Once | INTRAMUSCULAR | Status: AC
  Administered 2022-03-28: 10 mg via INTRAVENOUS
  Filled 2022-03-28: qty 2

## 2022-03-28 MED ORDER — IOHEXOL 350 MG/ML SOLN
80.0000 mL | Freq: Once | INTRAVENOUS | Status: AC | PRN
  Administered 2022-03-28: 75 mL via INTRAVENOUS

## 2022-03-28 MED ORDER — DIPHENHYDRAMINE HCL 50 MG/ML IJ SOLN
25.0000 mg | Freq: Once | INTRAMUSCULAR | Status: AC
  Administered 2022-03-28: 25 mg via INTRAVENOUS
  Filled 2022-03-28: qty 1

## 2022-03-28 NOTE — ED Notes (Signed)
Pt ambulated to BR, steady gait noted 

## 2022-03-28 NOTE — ED Provider Notes (Signed)
[facility] Emergency Department Provider Note MRN:  [id number]  Arrival date & time: 03/28/22     Chief Complaint   Headache   History of Present Illness   Tara [name] is a 71 y.o. year-old female with a history of CKD presenting to the ED with chief complaint of headache.  2 weeks of persistent headache, became severe while at dinner this evening.  Felt like she was having trouble with her speech, the words were coming out slowly.  Nausea but no vomiting.  No numbness or weakness to the arms or legs, no neck pain, no chest pain.  Review of Systems  A thorough review of systems was obtained and all systems are negative except as noted in the HPI and PMH.   Patient's Health History    Past Medical History:  Diagnosis Date   Anemia    on meds   ANKLE PAIN, LEFT 04/01/2008   ANXIETY 04/17/2007   on meds   Arthritis    generalized   Cataract    bilateral sx   Chronic kidney disease    stage 3   Colon polyp    COLONIC POLYPS, HX OF 08/01/2007   CONTUSIONS, MULTIPLE 04/01/2009   DEPRESSION 04/17/2007   on meds   DIZZINESS 08/01/2007   DYSPNEA 08/01/2007   with exertion   Enlargement of lymph nodes 08/13/2007   Excessive involuntary blinking    per pt,going on since 2018   Glaucoma    on meds   GLUCOSE INTOLERANCE 08/01/2007   Hypercalcemia due to sarcoidosis 2014   HYPERLIPIDEMIA 08/01/2007   no meds   HYPERSOMNIA 07/28/2008   HYPERTENSION 04/17/2007   Impaired glucose tolerance 03/23/2011   JOINT EFFUSION, LEFT KNEE 06/02/2010   Loose body in knee 04/01/2009   pt not sure?   Metabolic encephalopathy 56/31/4970-26/3785   Migraines    "stopped 3-4 yr ago" (07/23/2012)   Morbid obesity ([place]) 04/20/2007   OTHER DISEASES OF LUNG NOT ELSEWHERE CLASSIFIED 08/01/2007   Pain in joint, lower leg 04/01/2009   PERIPHERAL EDEMA 04/21/2009   Pre-diabetes    Sarcoidosis 09/25/2007   Seasonal allergies    SHOULDER PAIN, LEFT 04/01/2009   Sleep  apnea    does not use Cpap    Past Surgical History:  Procedure Laterality Date   BREAST SURGERY     Biopsy benign/bil breaST   CARDIOVERSION N/A 12/15/2021   Procedure: CARDIOVERSION;  Surgeon: Skeet Latch, MD;  Location: [place];  Service: Cardiovascular;  Laterality: N/A;   CARDIOVERSION N/A 02/09/2022   Procedure: CARDIOVERSION;  Surgeon: [name], MD;  Location: [place];  Service: Cardiovascular;  Laterality: N/A;   CATARACT EXTRACTION     RIGHT EYE   COLONOSCOPY  2020   KN-MAC-suprep(good)-tics/TA's   COMBINED MEDIASTINOSCOPY AND BRONCHOSCOPY  08/2007   COMBINED MEDIASTINOSCOPY AND BRONCHOSCOPY  2009   Dental implant     FRACTURE SURGERY  ?02/1997   "left upper arm; put rod in" (07/23/2012)   GUM SURGERY  2000-?2009   "several ORs; soft tissue graft; took material from roof of mouth" (07/23/2012   HUMERUS SURGERY Left 1998   rod insertion   KNEE ARTHROSCOPY  03/2004; 06/2009   "right; left" Dr. Theda Sers   KNEE SURGERY  2012   ARTHROSCOPIC LEFT KNEE   LYMPH NODE BIOPSY  ~ 2009   "for sarcoidosis; don't know exactly which nodes" (07/23/2102)   [place]   Open   POLYPECTOMY  2020   TA's  REFRACTIVE SURGERY  08/1998   "both eyes" (07/23/2012)   [place]   Bil   TOTAL KNEE ARTHROPLASTY Left 09/24/2016   Procedure: TOTAL KNEE ARTHROPLASTY;  Surgeon: Dorna Leitz, MD;  Location: [place];  Service: Orthopedics;  Laterality: Left;    Family History  Problem Relation Age of Onset   Diabetes Mother    Hypertension Mother    Stroke Mother    Heart disease Father 65   Hypertension Father    Hypertension Sister    Asthma Sister    Gout Sister    Colon cancer Neg Hx    Breast cancer Neg Hx    Esophageal cancer Neg Hx    Stomach cancer Neg Hx    Rectal cancer Neg Hx    Colon polyps Neg Hx     Social History   Socioeconomic History   Marital status: Widowed    Spouse name: Not on file   Number of children: Not on file   Years of  education: Not on file   Highest education level: Not on file  Occupational History   Occupation: Designer, jewellery  Tobacco Use   Smoking status: Never   Smokeless tobacco: Never   Tobacco comments:    Never smoke 12/25/21  Vaping Use   Vaping Use: Never used  Substance and Sexual Activity   Alcohol use: Yes    Alcohol/week: 1.0 - 2.0 standard drink of alcohol    Types: 1 - 2 Standard drinks or equivalent per week   Drug use: No   Sexual activity: Not Currently    Birth control/protection: Post-menopausal    Comment: 1st intercourse 21 yo-1 partner  Other Topics Concern   Not on file  Social History Narrative   Lives with 4 cats   Social Determinants of Health   Financial Resource Strain: Not on file  Food Insecurity: Not on file  Transportation Needs: Not on file  Physical Activity: Not on file  Stress: Not on file  Social Connections: Not on file  Intimate Partner Violence: Not on file     Physical Exam  There were no vitals filed for this visit.  CONSTITUTIONAL: Well-appearing, NAD NEURO/PSYCH:  Alert and oriented x 3, normal and symmetric strength and sensation, normal coordination, normal speech EYES:  eyes equal and reactive ENT/NECK:  no LAD, no JVD CARDIO: Regular rate, well-perfused, normal S1 and S2 PULM:  CTAB no wheezing or rhonchi GI/GU:  non-distended, non-tender MSK/SPINE:  No gross deformities, no edema SKIN:  no rash, atraumatic   *Additional and/or pertinent findings included in MDM below  Diagnostic and Interventional Summary    EKG Interpretation  Date/Time:    Ventricular Rate:    PR Interval:    QRS Duration:   QT Interval:    QTC Calculation:   R Axis:     Text Interpretation:        *** Labs Reviewed - No data to display  No orders to display    Medications  sodium chloride 0.9 % bolus 1,000 mL (has no administration in time range)  diphenhydrAMINE (BENADRYL) injection 25 mg (has no administration in time range)   prochlorperazine (COMPAZINE) injection 10 mg (has no administration in time range)     Procedures  /  Critical Care Procedures  ED Course and Medical Decision Making  Initial Impression and Ddx History of frequent headaches with this is more severe than normal and she is endorsing some speech disturbance, does not sound like slurred  speech nor aphasia, just slower possibly related to severity of symptoms.  Has had multiple recent work-ups but has not had CTA imaging, will obtain this evening if creatinine allows.  Past medical/surgical history that increases complexity of ED encounter:  ***  Interpretation of Diagnostics I personally reviewed the {BEROINTERP:26835} and my interpretation is as follows:  ***  ***  Patient Reassessment and Ultimate Disposition/Management     ***  Patient management required discussion with the following services or consulting groups:  {BEROCONSULT:26841}  Complexity of Problems Addressed {BEROCOPA:26833}  Additional Data Reviewed and Analyzed Further history obtained from: {BERODATA:26834}  Additional Factors Impacting ED Encounter Risk {BERORISK:26838}  Barth Kirks. Sedonia Small, [facility] [email or web address]  Final Clinical Impressions(s) / ED Diagnoses  No diagnosis found.  ED Discharge Orders     None        Discharge Instructions Discussed with and Provided to Patient:   Discharge Instructions   None

## 2022-03-28 NOTE — ED Notes (Signed)
Pt ambulated to [facility]

## 2022-03-28 NOTE — ED Notes (Signed)
Labs/IV

## 2022-03-28 NOTE — ED Notes (Signed)
Dr. Sedonia Small okay to d/c pt with HTN, pt did not take her afternoon BP meds as prescribe. Pt instructed to go ahead and take her morning dose when she gets home and f/u with PCP regarding BP regiment, may need more afternoon coverage. Pt verbalized understanding.

## 2022-03-28 NOTE — Discharge Instructions (Addendum)
You were evaluated in the Emergency Department and after careful evaluation, we did not find any emergent condition requiring admission or further testing in the hospital.  Your exam/testing today was overall reassuring.  Recommend follow-up with the neurologists to further discuss your headaches.  Please return to the Emergency Department if you experience any worsening of your condition.  Thank you for allowing [place] to be a part of your care.

## 2022-03-28 NOTE — ED Notes (Signed)
Pt return from CT scanner, NAD noted 

## 2022-03-29 ENCOUNTER — Encounter (HOSPITAL_COMMUNITY): Payer: Self-pay | Admitting: Emergency Medicine

## 2022-03-29 ENCOUNTER — Emergency Department (HOSPITAL_COMMUNITY)
Admission: EM | Admit: 2022-03-29 | Discharge: 2022-03-30 | Disposition: A | Discharge status: Home / Self Care | Payer: BC Managed Care – PPO | Attending: Emergency Medicine | Admitting: Emergency Medicine

## 2022-03-29 DIAGNOSIS — G43711 Chronic migraine without aura, intractable, with status migrainosus: Secondary | ICD-10-CM

## 2022-03-29 DIAGNOSIS — Z7901 Long term (current) use of anticoagulants: Secondary | ICD-10-CM | POA: Diagnosis not present

## 2022-03-29 DIAGNOSIS — G43709 Chronic migraine without aura, not intractable, without status migrainosus: Secondary | ICD-10-CM | POA: Diagnosis not present

## 2022-03-29 DIAGNOSIS — G4489 Other headache syndrome: Secondary | ICD-10-CM | POA: Diagnosis not present

## 2022-03-29 DIAGNOSIS — R519 Headache, unspecified: Secondary | ICD-10-CM | POA: Diagnosis not present

## 2022-03-29 DIAGNOSIS — R42 Dizziness and giddiness: Secondary | ICD-10-CM | POA: Diagnosis not present

## 2022-03-29 DIAGNOSIS — G43719 Chronic migraine without aura, intractable, without status migrainosus: Secondary | ICD-10-CM | POA: Insufficient documentation

## 2022-03-29 NOTE — ED Triage Notes (Signed)
Pt here from home with c/o h/a , chronic hx of same , was here on Tuesday for same

## 2022-03-30 MED ORDER — DIPHENHYDRAMINE HCL 25 MG PO CAPS
50.0000 mg | ORAL_CAPSULE | Freq: Once | ORAL | Status: AC
  Administered 2022-03-30: 50 mg via ORAL
  Filled 2022-03-30: qty 2

## 2022-03-30 MED ORDER — PROCHLORPERAZINE EDISYLATE 10 MG/2ML IJ SOLN
10.0000 mg | Freq: Once | INTRAMUSCULAR | Status: AC
  Administered 2022-03-30: 10 mg via INTRAMUSCULAR
  Filled 2022-03-30: qty 2

## 2022-03-30 NOTE — ED Provider Notes (Signed)
[place] DEPT Provider Note   CSN: [id number] Arrival date & time: 03/29/22  1803     History  Chief Complaint  Patient presents with   Headache    Tara [name] is a 71 y.o. female.  Patient presents to the emergency department for evaluation of headache.  Patient indicates that she has a chronic headache that never really goes away but it did worsen tonight.  No fever, neck pain or stiffness.       Home Medications Prior to Admission medications   Medication Sig Start Date End Date Taking? Authorizing Provider  amoxicillin (AMOXIL) 500 MG tablet Take 2,000 mg by mouth once. Prior to dental appointments 08/03/19  Yes [provider]  antiseptic oral rinse (BIOTENE) LIQD 15 mLs by Mouth Rinse route 3 (three) times daily as needed for dry mouth or mouth pain.   Yes [provider]  apixaban (ELIQUIS) 5 MG TABS tablet Take 1 tablet (5 mg total) by mouth 2 (two) times daily. 11/23/21  Yes BranchRoyetta Crochet, MD  baclofen (LIORESAL) 10 MG tablet Take 5 mg by mouth 2 (two) times daily as needed (Headache). 09/01/19  Yes [provider]  brimonidine-timolol (COMBIGAN) 0.2-0.5 % ophthalmic solution Place 1 drop into both eyes every 12 (twelve) hours. 12/02/19  Yes [provider]  carboxymethylcellulose (REFRESH PLUS) 0.5 % SOLN Place 1 drop into both eyes 3 (three) times daily as needed (Dry eyes). RETAIN eye drops instead of refresh   Yes [provider]  cholecalciferol (VITAMIN D) 25 MCG (1000 UNIT) tablet Take 1,000 Units by mouth daily.   Yes [provider]  clorazepate (TRANXENE) 7.5 MG tablet Take 7.5 mg by mouth 2 (two) times daily.   Yes [provider]  desvenlafaxine (PRISTIQ) 100 MG 24 hr tablet Take 100 mg by mouth at bedtime.   Yes [provider]  diltiazem (CARDIZEM CD) 240 MG 24 hr capsule Take 1 capsule (240 mg total) by mouth daily. 03/16/22  Yes Biagio Borg, MD  ferrous  sulfate 325 (65 FE) MG tablet Take 325 mg by mouth daily with breakfast.   Yes [provider]  hydrALAZINE (APRESOLINE) 100 MG tablet Take 100 mg by mouth 2 (two) times daily.   Yes [provider]  INGREZZA 40 MG capsule Take 40 mg by mouth daily. 03/14/22  Yes [provider]  latanoprost (XALATAN) 0.005 % ophthalmic solution Place 1 drop into both eyes at bedtime. 08/25/16  Yes [provider]  Melatonin 5 MG TABS Take 10 mg by mouth at bedtime.   Yes [provider]  Multiple Vitamins-Minerals (MULTIVITAMIN WITH MINERALS) tablet Take 1 tablet by mouth daily.   Yes [provider]  Omega 3 1200 MG CAPS Take 2,400 mg by mouth at bedtime.   Yes [provider]  PREVIDENT 5000 DRY MOUTH 1.1 % GEL dental gel Place 1 application. onto teeth daily. 09/22/19  Yes [provider]  zaleplon (SONATA) 10 MG capsule Take 20 mg by mouth at bedtime.   Yes [provider]      Allergies    Fluoxetine hcl, Sulfa antibiotics, Chocolate, Floxin [ocuflox], Other, and Sulfonamide derivatives    Review of Systems   Review of Systems  Physical Exam Updated Vital Signs BP (!) 139/93   Pulse 87   Temp 98.2 F (36.8 C) (Oral)   Resp 18   SpO2 99%  Physical Exam Vitals and nursing note reviewed.  Constitutional:  General: She is not in acute distress.    Appearance: She is well-developed.  HENT:     Head: Normocephalic and atraumatic.     Mouth/Throat:     Mouth: Mucous membranes are moist.  Eyes:     General: Vision grossly intact. Gaze aligned appropriately.     Extraocular Movements: Extraocular movements intact.     Conjunctiva/sclera: Conjunctivae normal.  Cardiovascular:     Rate and Rhythm: Normal rate and regular rhythm.     Pulses: Normal pulses.     Heart sounds: Normal heart sounds, S1 normal and S2 normal. No murmur heard.    No friction rub. No gallop.  Pulmonary:     Effort: Pulmonary effort is  normal. No respiratory distress.     Breath sounds: Normal breath sounds.  Abdominal:     General: Bowel sounds are normal.     Palpations: Abdomen is soft.     Tenderness: There is no abdominal tenderness. There is no guarding or rebound.     Hernia: No hernia is present.  Musculoskeletal:        General: No swelling.     Cervical back: Full passive range of motion without pain, normal range of motion and neck supple. No spinous process tenderness or muscular tenderness. Normal range of motion.     Right lower leg: No edema.     Left lower leg: No edema.  Skin:    General: Skin is warm and dry.     Capillary Refill: Capillary refill takes less than 2 seconds.     Findings: No ecchymosis, erythema, rash or wound.  Neurological:     General: No focal deficit present.     Mental Status: She is alert and oriented to person, place, and time.     GCS: GCS eye subscore is 4. GCS verbal subscore is 5. GCS motor subscore is 6.     Cranial Nerves: Cranial nerves 2-12 are intact.     Sensory: Sensation is intact.     Motor: Motor function is intact.     Coordination: Coordination is intact.  Psychiatric:        Attention and Perception: Attention normal.        Mood and Affect: Mood normal.        Speech: Speech normal.        Behavior: Behavior normal.     ED Results / Procedures / Treatments   Labs (all labs ordered are listed, but only abnormal results are displayed) Labs Reviewed - No data to display  EKG None  Radiology No results found.  Procedures Procedures    Medications Ordered in ED Medications  prochlorperazine (COMPAZINE) injection 10 mg (10 mg Intramuscular Given 03/30/22 0219)  diphenhydrAMINE (BENADRYL) capsule 50 mg (50 mg Oral Given 03/30/22 0218)    ED Course/ Medical Decision Making/ A&P                           Medical Decision Making Risk Prescription drug management.   Patient presents for headache.  Extensive chart review reveals that this has  been chronic for years.  She has been seen by multiple neurologists in the past.  She has had extensive imaging as well.  Patient was seen 2 days ago with similar headache and had CT angiography of head and neck.  There were no abnormalities noted.  She has no focal neurologic deficits on exam.  Patient does not require repeat imaging  today.  I did discuss with her that she has a very complicated history and the headache appears chronic.  It is unlikely that we will achieve resolution of her headache here.  She does have established relationships with a neurologist at [facility] and at [facility].  I recommend that she attempt further neurologic evaluation in the outpatient setting.         Final Clinical Impression(s) / ED Diagnoses Final diagnoses:  Intractable chronic migraine without aura and with status migrainosus    Rx / DC Orders ED Discharge Orders     None         Orpah Greek, MD 03/30/22 [phone]

## 2022-03-30 NOTE — Discharge Instructions (Signed)
You really need to follow-up with one of your neurologists for this problem.  It is unlikely that we will be able to effectively treated in the emergency department.

## 2022-03-31 ENCOUNTER — Encounter (HOSPITAL_COMMUNITY): Payer: Self-pay

## 2022-03-31 ENCOUNTER — Other Ambulatory Visit: Payer: Self-pay

## 2022-03-31 ENCOUNTER — Emergency Department (HOSPITAL_COMMUNITY)
Admission: EM | Admit: 2022-03-31 | Discharge: 2022-04-01 | Disposition: A | Discharge status: Home / Self Care | Payer: BC Managed Care – PPO | Attending: Emergency Medicine | Admitting: Emergency Medicine

## 2022-03-31 DIAGNOSIS — N183 Chronic kidney disease, stage 3 unspecified: Secondary | ICD-10-CM | POA: Diagnosis not present

## 2022-03-31 DIAGNOSIS — R519 Headache, unspecified: Secondary | ICD-10-CM | POA: Insufficient documentation

## 2022-03-31 DIAGNOSIS — I10 Essential (primary) hypertension: Secondary | ICD-10-CM | POA: Diagnosis not present

## 2022-03-31 DIAGNOSIS — G4489 Other headache syndrome: Secondary | ICD-10-CM | POA: Diagnosis not present

## 2022-03-31 DIAGNOSIS — I129 Hypertensive chronic kidney disease with stage 1 through stage 4 chronic kidney disease, or unspecified chronic kidney disease: Secondary | ICD-10-CM | POA: Diagnosis not present

## 2022-03-31 DIAGNOSIS — Z96652 Presence of left artificial knee joint: Secondary | ICD-10-CM | POA: Insufficient documentation

## 2022-03-31 DIAGNOSIS — Z7901 Long term (current) use of anticoagulants: Secondary | ICD-10-CM | POA: Insufficient documentation

## 2022-03-31 NOTE — ED Provider Triage Note (Signed)
Emergency Medicine Provider Triage Evaluation Note  Tara [name] , a 71 y.o. female  was evaluated in triage.  Pt complains of headache.  Reports that started this morning and has worsened throughout today.  Also feels that she is off balance.  Review of Systems  Positive: H/A Negative: Blurred vision, [place], visual disturbance  Physical Exam  BP (!) 184/118 (BP Location: Right Arm)   Pulse 83   Temp 98 F (36.7 C) (Oral)   Resp 16   SpO2 99%  Gen:   Awake, no distress   Resp:  Normal effort  MSK:   Moves extremities without difficulty  Other:  Cranial nerves II through XII grossly intact.  Moving all extremities.  5 out of 5 strength in bilateral upper and lower extremities.  No facial droop or visual disturbance.  PERRLA,  Medical Decision Making  Medically screening exam initiated at 6:51 PM.  Appropriate orders placed.  Tara [name] was informed that the remainder of the evaluation will be completed by another provider, this initial triage assessment does not replace that evaluation, and the importance of remaining in the ED until their evaluation is complete.  Had CT imaging for the similar a few days ago.  It was negative.  Will defer MRI to work-up of the back.  Basic labs ordered.   Rhae Hammock, [place] 03/31/22 1919

## 2022-03-31 NOTE — ED Triage Notes (Signed)
Per EMS-0 Patient is from home. Patient c/o headache since 0900 today and states it is 10/10 pain. Patient states that she was seen 2 days go for the same. Patient denies light sensitivity.  BP-178/92, HR-85, R-18, and CBG-128. Stroke Screen negative.

## 2022-04-01 ENCOUNTER — Emergency Department (HOSPITAL_COMMUNITY): Payer: BC Managed Care – PPO

## 2022-04-01 ENCOUNTER — Observation Stay (HOSPITAL_COMMUNITY): Payer: BC Managed Care – PPO

## 2022-04-01 ENCOUNTER — Inpatient Hospital Stay (HOSPITAL_COMMUNITY)
Admission: EM | Admit: 2022-04-01 | Discharge: 2022-04-09 | DRG: 690 | Disposition: A | Discharge status: Skilled Nursing Facility | Payer: BC Managed Care – PPO | Attending: Internal Medicine | Admitting: Internal Medicine

## 2022-04-01 ENCOUNTER — Encounter (HOSPITAL_COMMUNITY): Payer: Self-pay | Admitting: Emergency Medicine

## 2022-04-01 DIAGNOSIS — Z96652 Presence of left artificial knee joint: Secondary | ICD-10-CM | POA: Diagnosis present

## 2022-04-01 DIAGNOSIS — R55 Syncope and collapse: Secondary | ICD-10-CM | POA: Diagnosis not present

## 2022-04-01 DIAGNOSIS — E1122 Type 2 diabetes mellitus with diabetic chronic kidney disease: Secondary | ICD-10-CM | POA: Diagnosis not present

## 2022-04-01 DIAGNOSIS — Z79899 Other long term (current) drug therapy: Secondary | ICD-10-CM | POA: Diagnosis not present

## 2022-04-01 DIAGNOSIS — S199XXA Unspecified injury of neck, initial encounter: Secondary | ICD-10-CM | POA: Diagnosis not present

## 2022-04-01 DIAGNOSIS — D869 Sarcoidosis, unspecified: Secondary | ICD-10-CM | POA: Diagnosis not present

## 2022-04-01 DIAGNOSIS — E669 Obesity, unspecified: Secondary | ICD-10-CM | POA: Diagnosis present

## 2022-04-01 DIAGNOSIS — R296 Repeated falls: Secondary | ICD-10-CM | POA: Diagnosis present

## 2022-04-01 DIAGNOSIS — B9620 Unspecified Escherichia coli [E. coli] as the cause of diseases classified elsewhere: Secondary | ICD-10-CM | POA: Diagnosis present

## 2022-04-01 DIAGNOSIS — R5381 Other malaise: Secondary | ICD-10-CM | POA: Diagnosis present

## 2022-04-01 DIAGNOSIS — F411 Generalized anxiety disorder: Secondary | ICD-10-CM | POA: Diagnosis not present

## 2022-04-01 DIAGNOSIS — R1311 Dysphagia, oral phase: Secondary | ICD-10-CM | POA: Diagnosis not present

## 2022-04-01 DIAGNOSIS — I4819 Other persistent atrial fibrillation: Secondary | ICD-10-CM | POA: Diagnosis present

## 2022-04-01 DIAGNOSIS — R2689 Other abnormalities of gait and mobility: Secondary | ICD-10-CM | POA: Diagnosis present

## 2022-04-01 DIAGNOSIS — E876 Hypokalemia: Secondary | ICD-10-CM | POA: Diagnosis present

## 2022-04-01 DIAGNOSIS — I4811 Longstanding persistent atrial fibrillation: Secondary | ICD-10-CM | POA: Diagnosis not present

## 2022-04-01 DIAGNOSIS — W19XXXA Unspecified fall, initial encounter: Secondary | ICD-10-CM

## 2022-04-01 DIAGNOSIS — R531 Weakness: Secondary | ICD-10-CM | POA: Diagnosis not present

## 2022-04-01 DIAGNOSIS — F32A Depression, unspecified: Secondary | ICD-10-CM | POA: Diagnosis present

## 2022-04-01 DIAGNOSIS — E785 Hyperlipidemia, unspecified: Secondary | ICD-10-CM | POA: Diagnosis not present

## 2022-04-01 DIAGNOSIS — R2681 Unsteadiness on feet: Secondary | ICD-10-CM | POA: Diagnosis not present

## 2022-04-01 DIAGNOSIS — I4891 Unspecified atrial fibrillation: Secondary | ICD-10-CM | POA: Diagnosis present

## 2022-04-01 DIAGNOSIS — Z888 Allergy status to other drugs, medicaments and biological substances status: Secondary | ICD-10-CM

## 2022-04-01 DIAGNOSIS — I129 Hypertensive chronic kidney disease with stage 1 through stage 4 chronic kidney disease, or unspecified chronic kidney disease: Secondary | ICD-10-CM | POA: Diagnosis present

## 2022-04-01 DIAGNOSIS — E861 Hypovolemia: Secondary | ICD-10-CM | POA: Diagnosis present

## 2022-04-01 DIAGNOSIS — Z833 Family history of diabetes mellitus: Secondary | ICD-10-CM

## 2022-04-01 DIAGNOSIS — M47812 Spondylosis without myelopathy or radiculopathy, cervical region: Secondary | ICD-10-CM | POA: Diagnosis not present

## 2022-04-01 DIAGNOSIS — N390 Urinary tract infection, site not specified: Principal | ICD-10-CM | POA: Diagnosis present

## 2022-04-01 DIAGNOSIS — H409 Unspecified glaucoma: Secondary | ICD-10-CM | POA: Diagnosis present

## 2022-04-01 DIAGNOSIS — G4733 Obstructive sleep apnea (adult) (pediatric): Secondary | ICD-10-CM | POA: Diagnosis present

## 2022-04-01 DIAGNOSIS — M4692 Unspecified inflammatory spondylopathy, cervical region: Secondary | ICD-10-CM | POA: Diagnosis not present

## 2022-04-01 DIAGNOSIS — Y92009 Unspecified place in unspecified non-institutional (private) residence as the place of occurrence of the external cause: Secondary | ICD-10-CM

## 2022-04-01 DIAGNOSIS — N1831 Chronic kidney disease, stage 3a: Secondary | ICD-10-CM | POA: Diagnosis present

## 2022-04-01 DIAGNOSIS — G9341 Metabolic encephalopathy: Secondary | ICD-10-CM | POA: Diagnosis not present

## 2022-04-01 DIAGNOSIS — Z882 Allergy status to sulfonamides status: Secondary | ICD-10-CM | POA: Diagnosis not present

## 2022-04-01 DIAGNOSIS — Z6831 Body mass index (BMI) 31.0-31.9, adult: Secondary | ICD-10-CM

## 2022-04-01 DIAGNOSIS — Z8249 Family history of ischemic heart disease and other diseases of the circulatory system: Secondary | ICD-10-CM

## 2022-04-01 DIAGNOSIS — G4489 Other headache syndrome: Secondary | ICD-10-CM | POA: Diagnosis not present

## 2022-04-01 DIAGNOSIS — Z91018 Allergy to other foods: Secondary | ICD-10-CM | POA: Diagnosis not present

## 2022-04-01 DIAGNOSIS — I10 Essential (primary) hypertension: Secondary | ICD-10-CM | POA: Diagnosis present

## 2022-04-01 DIAGNOSIS — N184 Chronic kidney disease, stage 4 (severe): Secondary | ICD-10-CM | POA: Diagnosis not present

## 2022-04-01 DIAGNOSIS — Z20822 Contact with and (suspected) exposure to covid-19: Secondary | ICD-10-CM | POA: Diagnosis present

## 2022-04-01 DIAGNOSIS — R410 Disorientation, unspecified: Secondary | ICD-10-CM | POA: Diagnosis not present

## 2022-04-01 DIAGNOSIS — M1712 Unilateral primary osteoarthritis, left knee: Secondary | ICD-10-CM | POA: Diagnosis not present

## 2022-04-01 DIAGNOSIS — N179 Acute kidney failure, unspecified: Secondary | ICD-10-CM | POA: Diagnosis not present

## 2022-04-01 DIAGNOSIS — M6281 Muscle weakness (generalized): Secondary | ICD-10-CM | POA: Diagnosis not present

## 2022-04-01 DIAGNOSIS — Z743 Need for continuous supervision: Secondary | ICD-10-CM | POA: Diagnosis not present

## 2022-04-01 DIAGNOSIS — R42 Dizziness and giddiness: Secondary | ICD-10-CM | POA: Diagnosis not present

## 2022-04-01 DIAGNOSIS — Z043 Encounter for examination and observation following other accident: Secondary | ICD-10-CM | POA: Diagnosis not present

## 2022-04-01 DIAGNOSIS — I480 Paroxysmal atrial fibrillation: Secondary | ICD-10-CM | POA: Diagnosis not present

## 2022-04-01 DIAGNOSIS — Z7901 Long term (current) use of anticoagulants: Secondary | ICD-10-CM

## 2022-04-01 DIAGNOSIS — G2401 Drug induced subacute dyskinesia: Secondary | ICD-10-CM | POA: Diagnosis present

## 2022-04-01 DIAGNOSIS — R519 Headache, unspecified: Secondary | ICD-10-CM | POA: Diagnosis not present

## 2022-04-01 DIAGNOSIS — D6869 Other thrombophilia: Secondary | ICD-10-CM | POA: Diagnosis not present

## 2022-04-01 DIAGNOSIS — Z9181 History of falling: Secondary | ICD-10-CM | POA: Diagnosis not present

## 2022-04-01 DIAGNOSIS — F320 Major depressive disorder, single episode, mild: Secondary | ICD-10-CM | POA: Diagnosis not present

## 2022-04-01 DIAGNOSIS — D509 Iron deficiency anemia, unspecified: Secondary | ICD-10-CM | POA: Diagnosis not present

## 2022-04-01 DIAGNOSIS — G43909 Migraine, unspecified, not intractable, without status migrainosus: Secondary | ICD-10-CM | POA: Diagnosis not present

## 2022-04-01 DIAGNOSIS — E860 Dehydration: Secondary | ICD-10-CM | POA: Diagnosis not present

## 2022-04-01 DIAGNOSIS — F330 Major depressive disorder, recurrent, mild: Secondary | ICD-10-CM | POA: Diagnosis not present

## 2022-04-01 DIAGNOSIS — M7671 Peroneal tendinitis, right leg: Secondary | ICD-10-CM | POA: Diagnosis not present

## 2022-04-01 DIAGNOSIS — S0990XA Unspecified injury of head, initial encounter: Secondary | ICD-10-CM | POA: Diagnosis not present

## 2022-04-01 LAB — CBC WITH DIFFERENTIAL/PLATELET
Abs Immature Granulocytes: 0.05 10*3/uL (ref 0.00–0.07)
Basophils Absolute: 0.1 10*3/uL (ref 0.0–0.1)
Basophils Relative: 1 %
Eosinophils Absolute: 0 10*3/uL (ref 0.0–0.5)
Eosinophils Relative: 0 %
HCT: 43.8 % (ref 36.0–46.0)
Hemoglobin: 14.7 g/dL (ref 12.0–15.0)
Immature Granulocytes: 1 %
Lymphocytes Relative: 10 %
Lymphs Abs: 1.1 10*3/uL (ref 0.7–4.0)
MCH: 28.8 pg (ref 26.0–34.0)
MCHC: 33.6 g/dL (ref 30.0–36.0)
MCV: 85.7 fL (ref 80.0–100.0)
Monocytes Absolute: 1 10*3/uL (ref 0.1–1.0)
Monocytes Relative: 9 %
Neutro Abs: 8.7 10*3/uL — ABNORMAL HIGH (ref 1.7–7.7)
Neutrophils Relative %: 79 %
Platelets: 204 10*3/uL (ref 150–400)
RBC: 5.11 MIL/uL (ref 3.87–5.11)
RDW: 15 % (ref 11.5–15.5)
WBC: 10.9 10*3/uL — ABNORMAL HIGH (ref 4.0–10.5)
nRBC: 0 % (ref 0.0–0.2)

## 2022-04-01 LAB — URINALYSIS, ROUTINE W REFLEX MICROSCOPIC
Bilirubin Urine: NEGATIVE
Glucose, UA: NEGATIVE mg/dL
Ketones, ur: NEGATIVE mg/dL
Nitrite: NEGATIVE
Protein, ur: 100 mg/dL — AB
Specific Gravity, Urine: 1.015 (ref 1.005–1.030)
WBC, UA: >50 WBC/hpf — ABNORMAL HIGH (ref 0–5)
pH: 5 (ref 5.0–8.0)

## 2022-04-01 LAB — COMPREHENSIVE METABOLIC PANEL
ALT: 21 U/L (ref 0–44)
AST: 27 U/L (ref 15–41)
Albumin: 3.5 g/dL (ref 3.5–5.0)
Alkaline Phosphatase: 83 U/L (ref 38–126)
Anion gap: 10 (ref 5–15)
BUN: 18 mg/dL (ref 8–23)
CO2: 21 mmol/L — ABNORMAL LOW (ref 22–32)
Calcium: 9.3 mg/dL (ref 8.9–10.3)
Chloride: 110 mmol/L (ref 98–111)
Creatinine, Ser: 1.29 mg/dL — ABNORMAL HIGH (ref 0.44–1.00)
GFR, Estimated: 45 mL/min — ABNORMAL LOW (ref >60)
Glucose, Bld: 125 mg/dL — ABNORMAL HIGH (ref 70–99)
Potassium: 3.4 mmol/L — ABNORMAL LOW (ref 3.5–5.1)
Sodium: 141 mmol/L (ref 135–145)
Total Bilirubin: 0.8 mg/dL (ref 0.3–1.2)
Total Protein: 6.4 g/dL — ABNORMAL LOW (ref 6.5–8.1)

## 2022-04-01 LAB — CK: Total CK: 253 U/L — ABNORMAL HIGH (ref 38–234)

## 2022-04-01 LAB — BASIC METABOLIC PANEL
Anion gap: 9 (ref 5–15)
BUN: 14 mg/dL (ref 8–23)
CO2: 26 mmol/L (ref 22–32)
Calcium: 8.9 mg/dL (ref 8.9–10.3)
Chloride: 110 mmol/L (ref 98–111)
Creatinine, Ser: 1.14 mg/dL — ABNORMAL HIGH (ref 0.44–1.00)
GFR, Estimated: 52 mL/min — ABNORMAL LOW (ref >60)
Glucose, Bld: 88 mg/dL (ref 70–99)
Potassium: 3.3 mmol/L — ABNORMAL LOW (ref 3.5–5.1)
Sodium: 145 mmol/L (ref 135–145)

## 2022-04-01 LAB — LACTIC ACID, PLASMA: Lactic Acid, Venous: 1.1 mmol/L (ref 0.5–1.9)

## 2022-04-01 LAB — TSH: TSH: 1.389 u[IU]/mL (ref 0.350–4.500)

## 2022-04-01 LAB — HEMOGLOBIN A1C
Hgb A1c MFr Bld: 5.6 % (ref 4.8–5.6)
Mean Plasma Glucose: 114.02 mg/dL

## 2022-04-01 LAB — TROPONIN I (HIGH SENSITIVITY)
Troponin I (High Sensitivity): 36 ng/L — ABNORMAL HIGH (ref <18)
Troponin I (High Sensitivity): 48 ng/L — ABNORMAL HIGH (ref <18)
Troponin I (High Sensitivity): 59 ng/L — ABNORMAL HIGH (ref <18)

## 2022-04-01 LAB — MAGNESIUM: Magnesium: 1.9 mg/dL (ref 1.7–2.4)

## 2022-04-01 LAB — HIV ANTIBODY (ROUTINE TESTING W REFLEX): HIV Screen 4th Generation wRfx: NONREACTIVE

## 2022-04-01 MED ORDER — FERROUS SULFATE 325 (65 FE) MG PO TABS
325.0000 mg | ORAL_TABLET | Freq: Every day | ORAL | Status: DC
  Administered 2022-04-02 – 2022-04-09 (×8): 325 mg via ORAL
  Filled 2022-04-01 (×8): qty 1

## 2022-04-01 MED ORDER — ACETAMINOPHEN 650 MG RE SUPP: 650.0000 mg | Freq: Four times a day (QID) | RECTAL | Status: DC | PRN

## 2022-04-01 MED ORDER — SODIUM CHLORIDE 0.9 % IV BOLUS
1000.0000 mL | Freq: Once | INTRAVENOUS | Status: AC
  Administered 2022-04-01: 1000 mL via INTRAVENOUS

## 2022-04-01 MED ORDER — DILTIAZEM HCL ER COATED BEADS 120 MG PO CP24
240.0000 mg | ORAL_CAPSULE | Freq: Every day | ORAL | Status: DC
  Administered 2022-04-01 – 2022-04-09 (×9): 240 mg via ORAL
  Filled 2022-04-01 (×2): qty 2
  Filled 2022-04-01 (×2): qty 1
  Filled 2022-04-01 (×5): qty 2
  Filled 2022-04-01: qty 1

## 2022-04-01 MED ORDER — HYDRALAZINE HCL 50 MG PO TABS
100.0000 mg | ORAL_TABLET | Freq: Two times a day (BID) | ORAL | Status: DC
  Administered 2022-04-01 – 2022-04-05 (×8): 100 mg via ORAL
  Filled 2022-04-01 (×8): qty 2

## 2022-04-01 MED ORDER — SODIUM CHLORIDE 0.9 % IV SOLN: INTRAVENOUS | Status: AC

## 2022-04-01 MED ORDER — ACETAMINOPHEN 325 MG PO TABS
650.0000 mg | ORAL_TABLET | Freq: Four times a day (QID) | ORAL | Status: DC | PRN
  Administered 2022-04-04 – 2022-04-07 (×3): 650 mg via ORAL
  Filled 2022-04-01 (×3): qty 2

## 2022-04-01 MED ORDER — DROPERIDOL 2.5 MG/ML IJ SOLN
2.5000 mg | Freq: Once | INTRAMUSCULAR | Status: AC
  Administered 2022-04-01: 2.5 mg via INTRAVENOUS
  Filled 2022-04-01: qty 2

## 2022-04-01 MED ORDER — ALBUTEROL SULFATE (2.5 MG/3ML) 0.083% IN NEBU: 2.5000 mg | INHALATION_SOLUTION | RESPIRATORY_TRACT | Status: DC | PRN

## 2022-04-01 MED ORDER — SODIUM CHLORIDE 0.9 % IV SOLN
1.0000 g | INTRAVENOUS | Status: DC
  Administered 2022-04-02: 1 g via INTRAVENOUS
  Filled 2022-04-01: qty 10

## 2022-04-01 MED ORDER — SODIUM CHLORIDE 0.9 % IV SOLN
1.0000 g | Freq: Once | INTRAVENOUS | Status: AC
  Administered 2022-04-01: 1 g via INTRAVENOUS
  Filled 2022-04-01: qty 10

## 2022-04-01 MED ORDER — POTASSIUM CHLORIDE 20 MEQ PO PACK
40.0000 meq | PACK | Freq: Once | ORAL | Status: AC
  Administered 2022-04-01: 40 meq via ORAL
  Filled 2022-04-01: qty 2

## 2022-04-01 MED ORDER — APIXABAN 5 MG PO TABS
5.0000 mg | ORAL_TABLET | Freq: Two times a day (BID) | ORAL | Status: DC
  Administered 2022-04-01 – 2022-04-09 (×16): 5 mg via ORAL
  Filled 2022-04-01 (×16): qty 1
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
## 2022-04-01 NOTE — ED Notes (Signed)
Lab called to check on UA status. Lab states they are processing it shortly.

## 2022-04-01 NOTE — ED Provider Notes (Signed)
[facility] DEPT Provider Note: Tara Spurling, MD, FACEP  CSN: [id number] MRN: [id number] ARRIVAL: 03/31/22 at [place]: [place]  Migraine   HISTORY OF PRESENT ILLNESS  04/01/22 1:06 AM Tara [name] is a 70 y.o. female chronic recurrent headaches.  She is here with a headache that began yesterday morning about 9 AM.  She describes the pain as bitemporal and rates it as a 10 out of 10.  She denies photophobia, nausea or vomiting.  This is her third visit to the ED in a week for a headache.  A CT scan scan of her head (with CTA) on 03/28/2022 was normal except for some mild small vessel ischemic changes.   Past Medical History:  Diagnosis Date   Anemia    on meds   ANKLE PAIN, LEFT 04/01/2008   ANXIETY 04/17/2007   on meds   Arthritis    generalized   Cataract    bilateral sx   Chronic kidney disease    stage 3   Colon polyp    COLONIC POLYPS, HX OF 08/01/2007   CONTUSIONS, MULTIPLE 04/01/2009   DEPRESSION 04/17/2007   on meds   DIZZINESS 08/01/2007   DYSPNEA 08/01/2007   with exertion   Enlargement of lymph nodes 08/13/2007   Excessive involuntary blinking    per pt,going on since 2018   Glaucoma    on meds   GLUCOSE INTOLERANCE 08/01/2007   Hypercalcemia due to sarcoidosis 2014   HYPERLIPIDEMIA 08/01/2007   no meds   HYPERSOMNIA 07/28/2008   HYPERTENSION 04/17/2007   Impaired glucose tolerance 03/23/2011   JOINT EFFUSION, LEFT KNEE 06/02/2010   Loose body in knee 04/01/2009   pt not sure?   Metabolic encephalopathy 54/49/2010-01/1218   Migraines    "stopped 3-4 yr ago" (07/23/2012)   Morbid obesity ([place]) 04/20/2007   OTHER DISEASES OF LUNG NOT ELSEWHERE CLASSIFIED 08/01/2007   Pain in joint, lower leg 04/01/2009   PERIPHERAL EDEMA 04/21/2009   Pre-diabetes    Sarcoidosis 09/25/2007   Seasonal allergies    SHOULDER PAIN, LEFT 04/01/2009   Sleep apnea    does not use Cpap    Past Surgical History:  Procedure Laterality Date    BREAST SURGERY     Biopsy benign/bil breaST   CARDIOVERSION N/A 12/15/2021   Procedure: CARDIOVERSION;  Surgeon: Skeet Latch, MD;  Location: [facility];  Service: Cardiovascular;  Laterality: N/A;   CARDIOVERSION N/A 02/09/2022   Procedure: CARDIOVERSION;  Surgeon: [name], MD;  Location: [facility];  Service: Cardiovascular;  Laterality: N/A;   CATARACT EXTRACTION     RIGHT EYE   COLONOSCOPY  2020   KN-MAC-suprep(good)-tics/TA's   COMBINED MEDIASTINOSCOPY AND BRONCHOSCOPY  08/2007   COMBINED MEDIASTINOSCOPY AND BRONCHOSCOPY  2009   Dental implant     FRACTURE SURGERY  ?02/1997   "left upper arm; put rod in" (07/23/2012)   GUM SURGERY  2000-?2009   "several ORs; soft tissue graft; took material from roof of mouth" (07/23/2012   HUMERUS SURGERY Left 1998   rod insertion   KNEE ARTHROSCOPY  03/2004; 06/2009   "right; left" Dr. Theda Sers   KNEE SURGERY  2012   ARTHROSCOPIC LEFT KNEE   LYMPH NODE BIOPSY  ~ 2009   "for sarcoidosis; don't know exactly which nodes" (07/23/2102)   [place]   Open   POLYPECTOMY  2020   TA's   REFRACTIVE SURGERY  08/1998   "both eyes" (07/23/2012)   [place]  Bil   TOTAL KNEE ARTHROPLASTY Left 09/24/2016   Procedure: TOTAL KNEE ARTHROPLASTY;  Surgeon: Dorna Leitz, MD;  Location: [facility];  Service: Orthopedics;  Laterality: Left;    Family History  Problem Relation Age of Onset   Diabetes Mother    Hypertension Mother    Stroke Mother    Heart disease Father 90   Hypertension Father    Hypertension Sister    Asthma Sister    Gout Sister    Colon cancer Neg Hx    Breast cancer Neg Hx    Esophageal cancer Neg Hx    Stomach cancer Neg Hx    Rectal cancer Neg Hx    Colon polyps Neg Hx     Social History   Tobacco Use   Smoking status: Never   Smokeless tobacco: Never   Tobacco comments:    Never smoke 12/25/21  Vaping Use   Vaping Use: Never used  Substance Use Topics   Alcohol use: Yes    Alcohol/week:  1.0 - 2.0 standard drink of alcohol    Types: 1 - 2 Standard drinks or equivalent per week   Drug use: No    Prior to Admission medications   Medication Sig Start Date End Date Taking? Authorizing Provider  amoxicillin (AMOXIL) 500 MG tablet Take 2,000 mg by mouth once. Prior to dental appointments 08/03/19  Yes [provider]  antiseptic oral rinse (BIOTENE) LIQD 15 mLs by Mouth Rinse route 3 (three) times daily as needed for dry mouth or mouth pain.   Yes [provider]  apixaban (ELIQUIS) 5 MG TABS tablet Take 1 tablet (5 mg total) by mouth 2 (two) times daily. 11/23/21  Yes BranchRoyetta Crochet, MD  baclofen (LIORESAL) 10 MG tablet Take 5 mg by mouth 2 (two) times daily as needed (Headache). 09/01/19  Yes [provider]  brimonidine-timolol (COMBIGAN) 0.2-0.5 % ophthalmic solution Place 1 drop into both eyes every 12 (twelve) hours. 12/02/19  Yes [provider]  carboxymethylcellulose (REFRESH PLUS) 0.5 % SOLN Place 1 drop into both eyes 3 (three) times daily as needed (Dry eyes). RETAIN eye drops instead of refresh   Yes [provider]  cholecalciferol (VITAMIN D) 25 MCG (1000 UNIT) tablet Take 1,000 Units by mouth daily.   Yes [provider]  clorazepate (TRANXENE) 7.5 MG tablet Take 7.5 mg by mouth 2 (two) times daily.   Yes [provider]  desvenlafaxine (PRISTIQ) 100 MG 24 hr tablet Take 100 mg by mouth at bedtime.   Yes [provider]  diltiazem (CARDIZEM CD) 240 MG 24 hr capsule Take 1 capsule (240 mg total) by mouth daily. 03/16/22  Yes Biagio Borg, MD  ferrous sulfate 325 (65 FE) MG tablet Take 325 mg by mouth daily with breakfast.   Yes [provider]  hydrALAZINE (APRESOLINE) 100 MG tablet Take 100 mg by mouth 2 (two) times daily.   Yes [provider]  INGREZZA 40 MG capsule Take 40 mg by mouth daily. 03/14/22  Yes [provider]  latanoprost (XALATAN) 0.005 % ophthalmic solution  Place 1 drop into both eyes at bedtime. 08/25/16  Yes [provider]  Melatonin 5 MG TABS Take 10 mg by mouth at bedtime.   Yes [provider]  Multiple Vitamins-Minerals (MULTIVITAMIN WITH MINERALS) tablet Take 1 tablet by mouth daily.   Yes [provider]  Omega 3 1200 MG CAPS Take 2,400 mg by mouth at bedtime.   Yes [provider]  PREVIDENT 5000 DRY MOUTH 1.1 % GEL dental gel Place 1 application. onto teeth daily. 09/22/19  Yes [provider]  zaleplon (SONATA) 10 MG capsule Take 20 mg by mouth at bedtime.   Yes [provider]    Allergies Fluoxetine hcl, Sulfa antibiotics, Chocolate, Floxin [ocuflox], Other, and Sulfonamide derivatives   REVIEW OF SYSTEMS  Negative except as noted here or in the History of Present Illness.   PHYSICAL EXAMINATION  Initial Vital Signs Blood pressure (!) 184/118, pulse 83, temperature 98 F (36.7 C), temperature source Oral, resp. rate 16, height _0  (1.676 m), weight 88.7 kg, SpO2 99 %.  Examination General: Well-developed, well-nourished female in no acute distress; appearance consistent with age of record HENT: normocephalic; atraumatic Eyes: pupils equal, round and reactive to light; extraocular muscles intact Neck: supple Heart: regular rate and rhythm Lungs: clear to auscultation bilaterally Abdomen: soft; nondistended; nontender; bowel sounds present Extremities: No deformity; full range of motion; pulses normal Neurologic: Awake, alert and oriented; motor function intact in all extremities and symmetric; no facial droop Skin: Warm and dry Psychiatric: Flat affect   RESULTS  Summary of this visit's results, reviewed and interpreted by myself:   EKG Interpretation  Date/Time:  Sunday April 01 2022 01:35:50 EDT Ventricular Rate:  91 PR Interval:    QRS Duration: 87 QT Interval:  390 QTC Calculation: 480 R Axis:   -3 Text Interpretation: Atrial fibrillation Consider  left ventricular hypertrophy Borderline T abnormalities, inferior leads Rate is faster Confirmed by Amani Nodarse, [name] [phone]) on 04/01/2022 1:38:38 AM       Laboratory Studies: No results found for this or any previous visit (from the past 24 hour(s)). Imaging Studies: No results found.  ED COURSE and MDM  Nursing notes, initial and subsequent vitals signs, including pulse oximetry, reviewed and interpreted by myself.  Vitals:   03/31/22 1918 04/01/22 0100 04/01/22 0115 04/01/22 0215  BP:  (!) 180/118 (!) 189/111 (!) 158/82  Pulse:  87 90 82  Resp:   14 16  Temp:   98.3 F (36.8 C)   TempSrc:      SpO2:  97% 96% 96%  Weight: 88.7 kg     Height: _1  (1.676 m)      Medications  droperidol (INAPSINE) 2.5 MG/ML injection 2.5 mg (2.5 mg Intravenous Given 04/01/22 0212)  sodium chloride 0.9 % bolus 1,000 mL (1,000 mLs Intravenous New Bag/Given 04/01/22 0211)   1:39 AM Although the computer reads the QTc on the patient's EKG is 480 the QT on expection is clearly less than 50%.  It should be safe to administer droperidol.   3:09 AM Patient's headache improved after IV fluids and droperidol.  She has no focal neurologic deficits to suggest an acute neurologic event and as noted above she just had a reassuring head CT and CTA.   PROCEDURES  Procedures   ED DIAGNOSES     ICD-10-CM   1. Chronic nonintractable headache, unspecified headache type  R51.9    G89.29          Everett Ricciardelli, [name], MD 04/01/22 2111
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
## 2022-04-01 NOTE — ED Notes (Signed)
Patient transported to CT 

## 2022-04-02 ENCOUNTER — Encounter (HOSPITAL_COMMUNITY): Payer: BC Managed Care – PPO

## 2022-04-02 ENCOUNTER — Observation Stay (HOSPITAL_COMMUNITY): Payer: BC Managed Care – PPO

## 2022-04-02 DIAGNOSIS — N390 Urinary tract infection, site not specified: Secondary | ICD-10-CM | POA: Diagnosis not present

## 2022-04-02 DIAGNOSIS — W19XXXA Unspecified fall, initial encounter: Secondary | ICD-10-CM

## 2022-04-02 DIAGNOSIS — I4811 Longstanding persistent atrial fibrillation: Secondary | ICD-10-CM | POA: Diagnosis not present

## 2022-04-02 DIAGNOSIS — R55 Syncope and collapse: Secondary | ICD-10-CM | POA: Diagnosis not present

## 2022-04-02 DIAGNOSIS — N179 Acute kidney failure, unspecified: Secondary | ICD-10-CM | POA: Diagnosis not present

## 2022-04-02 LAB — CBC
HCT: 43.2 % (ref 36.0–46.0)
Hemoglobin: 14.2 g/dL (ref 12.0–15.0)
MCH: 28.9 pg (ref 26.0–34.0)
MCHC: 32.9 g/dL (ref 30.0–36.0)
MCV: 87.8 fL (ref 80.0–100.0)
Platelets: 195 10*3/uL (ref 150–400)
RBC: 4.92 MIL/uL (ref 3.87–5.11)
RDW: 15.3 % (ref 11.5–15.5)
WBC: 9.9 10*3/uL (ref 4.0–10.5)
nRBC: 0 % (ref 0.0–0.2)

## 2022-04-02 LAB — COMPREHENSIVE METABOLIC PANEL
ALT: 21 U/L (ref 0–44)
AST: 36 U/L (ref 15–41)
Albumin: 3.2 g/dL — ABNORMAL LOW (ref 3.5–5.0)
Alkaline Phosphatase: 78 U/L (ref 38–126)
Anion gap: 13 (ref 5–15)
BUN: 14 mg/dL (ref 8–23)
CO2: 19 mmol/L — ABNORMAL LOW (ref 22–32)
Calcium: 8.9 mg/dL (ref 8.9–10.3)
Chloride: 110 mmol/L (ref 98–111)
Creatinine, Ser: 1.05 mg/dL — ABNORMAL HIGH (ref 0.44–1.00)
GFR, Estimated: 58 mL/min — ABNORMAL LOW (ref >60)
Glucose, Bld: 91 mg/dL (ref 70–99)
Potassium: 3.8 mmol/L (ref 3.5–5.1)
Sodium: 142 mmol/L (ref 135–145)
Total Bilirubin: 0.9 mg/dL (ref 0.3–1.2)
Total Protein: 5.8 g/dL — ABNORMAL LOW (ref 6.5–8.1)

## 2022-04-02 LAB — TROPONIN I (HIGH SENSITIVITY): Troponin I (High Sensitivity): 47 ng/L — ABNORMAL HIGH (ref <18)

## 2022-04-02 LAB — LACTIC ACID, PLASMA: Lactic Acid, Venous: 1.1 mmol/L (ref 0.5–1.9)

## 2022-04-02 LAB — SARS CORONAVIRUS 2 BY RT PCR: SARS Coronavirus 2 by RT PCR: NEGATIVE

## 2022-04-02 MED ORDER — POLYVINYL ALCOHOL 1.4 % OP SOLN: 1.0000 [drp] | Freq: Three times a day (TID) | OPHTHALMIC | Status: DC | PRN

## 2022-04-02 MED ORDER — BRIMONIDINE TARTRATE-TIMOLOL 0.2-0.5 % OP SOLN: 1.0000 [drp] | Freq: Two times a day (BID) | OPHTHALMIC | Status: DC

## 2022-04-02 MED ORDER — CLORAZEPATE DIPOTASSIUM 3.75 MG PO TABS
7.5000 mg | ORAL_TABLET | Freq: Two times a day (BID) | ORAL | Status: DC
  Administered 2022-04-02 – 2022-04-09 (×14): 7.5 mg via ORAL
  Filled 2022-04-02 (×14): qty 2

## 2022-04-02 MED ORDER — TIMOLOL MALEATE 0.5 % OP SOLN
1.0000 [drp] | Freq: Two times a day (BID) | OPHTHALMIC | Status: DC
  Administered 2022-04-02 – 2022-04-09 (×14): 1 [drp] via OPHTHALMIC
  Filled 2022-04-02: qty 5

## 2022-04-02 MED ORDER — LATANOPROST 0.005 % OP SOLN
1.0000 [drp] | Freq: Every day | OPHTHALMIC | Status: DC
  Administered 2022-04-03 – 2022-04-08 (×6): 1 [drp] via OPHTHALMIC
  Filled 2022-04-02 (×2): qty 2.5

## 2022-04-02 MED ORDER — VALBENAZINE TOSYLATE 40 MG PO CAPS
40.0000 mg | ORAL_CAPSULE | Freq: Every day | ORAL | Status: DC
  Administered 2022-04-02 – 2022-04-09 (×8): 40 mg via ORAL
  Filled 2022-04-02 (×12): qty 1

## 2022-04-02 MED ORDER — ORAL CARE MOUTH RINSE: 15.0000 mL | OROMUCOSAL | Status: DC | PRN

## 2022-04-02 MED ORDER — FLECAINIDE ACETATE 50 MG PO TABS
50.0000 mg | ORAL_TABLET | Freq: Two times a day (BID) | ORAL | Status: DC
  Administered 2022-04-02 – 2022-04-09 (×13): 50 mg via ORAL
  Filled 2022-04-02 (×15): qty 1

## 2022-04-02 MED ORDER — BRIMONIDINE TARTRATE 0.2 % OP SOLN
1.0000 [drp] | Freq: Two times a day (BID) | OPHTHALMIC | Status: DC
  Administered 2022-04-02 – 2022-04-09 (×14): 1 [drp] via OPHTHALMIC
  Filled 2022-04-02: qty 5

## 2022-04-02 MED ORDER — DESVENLAFAXINE SUCCINATE ER 100 MG PO TB24
100.0000 mg | ORAL_TABLET | Freq: Every day | ORAL | Status: DC
  Administered 2022-04-02 – 2022-04-08 (×7): 100 mg via ORAL
  Filled 2022-04-02 (×8): qty 1
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
## 2022-04-03 ENCOUNTER — Encounter (HOSPITAL_COMMUNITY): Payer: BC Managed Care – PPO

## 2022-04-03 DIAGNOSIS — I4811 Longstanding persistent atrial fibrillation: Secondary | ICD-10-CM | POA: Diagnosis not present

## 2022-04-03 DIAGNOSIS — W19XXXA Unspecified fall, initial encounter: Secondary | ICD-10-CM | POA: Diagnosis not present

## 2022-04-03 DIAGNOSIS — N390 Urinary tract infection, site not specified: Secondary | ICD-10-CM | POA: Diagnosis not present

## 2022-04-03 DIAGNOSIS — N179 Acute kidney failure, unspecified: Secondary | ICD-10-CM | POA: Diagnosis not present

## 2022-04-03 LAB — CBC
HCT: 38.9 % (ref 36.0–46.0)
Hemoglobin: 12.8 g/dL (ref 12.0–15.0)
MCH: 28.4 pg (ref 26.0–34.0)
MCHC: 32.9 g/dL (ref 30.0–36.0)
MCV: 86.3 fL (ref 80.0–100.0)
Platelets: 183 10*3/uL (ref 150–400)
RBC: 4.51 MIL/uL (ref 3.87–5.11)
RDW: 15.5 % (ref 11.5–15.5)
WBC: 8.2 10*3/uL (ref 4.0–10.5)
nRBC: 0 % (ref 0.0–0.2)

## 2022-04-03 LAB — URINE CULTURE: Culture: >=100000 — AB

## 2022-04-03 LAB — BASIC METABOLIC PANEL
Anion gap: 5 (ref 5–15)
BUN: 16 mg/dL (ref 8–23)
CO2: 25 mmol/L (ref 22–32)
Calcium: 8.8 mg/dL — ABNORMAL LOW (ref 8.9–10.3)
Chloride: 108 mmol/L (ref 98–111)
Creatinine, Ser: 1.14 mg/dL — ABNORMAL HIGH (ref 0.44–1.00)
GFR, Estimated: 52 mL/min — ABNORMAL LOW (ref >60)
Glucose, Bld: 107 mg/dL — ABNORMAL HIGH (ref 70–99)
Potassium: 3.5 mmol/L (ref 3.5–5.1)
Sodium: 138 mmol/L (ref 135–145)

## 2022-04-03 MED ORDER — SODIUM CHLORIDE 0.9 % IV SOLN
2.0000 g | Freq: Two times a day (BID) | INTRAVENOUS | Status: AC
  Administered 2022-04-03 – 2022-04-07 (×10): 2 g via INTRAVENOUS
  Filled 2022-04-03 (×10): qty 12.5

## 2022-04-03 NOTE — Evaluation (Addendum)
Occupational Therapy Evaluation Patient Details Name: Tara [name] MRN: [id number] DOB: October 02, 1950 Today's Date: 04/03/2022   History of Present Illness Pt is a 71 year old femla eadmitted with generalized weakness and fall. Found to have a UTI; PMH signficant for  chronic kidney disease, hypertension, obstructive sleep apnea, atrial fibrillation on anticoagulation   Clinical Impression   Chart reviewed, pt greeted in room agreeable to OT evaluation. Pt is alert and oriented x4, appropriate safety awareness and awareness of deficits. PTA pt was indep in ADL/IADL. Pt lives alone. Pt reports 2 falls in the last 6 months. Pt presents with deficits in endurance, activity tolerance, balance, affecting safe and optimal ADL completion. No nausea/dizziness reported during/following mobility/ADL tasks. Recommend HHOT following discharge to address functional deficits and to facilitate return to PLOF. Pt is left in chair, all needs met. OT will follow acutely.      Recommendations for follow up therapy are one component of a multi-disciplinary discharge planning process, led by the attending physician.  Recommendations may be updated based on patient status, additional functional criteria and insurance authorization.   Follow Up Recommendations  Home health OT    Assistance Recommended at Discharge Intermittent Supervision/Assistance  Patient can return home with the following A little help with bathing/dressing/bathroom;Assistance with cooking/housework    Functional Status Assessment  Patient has had a recent decline in their functional status and demonstrates the ability to make significant improvements in function in a reasonable and predictable amount of time.  Equipment Recommendations  BSC/3in1;Tub/shower seat    Recommendations for Other Services       Precautions / Restrictions Precautions Precautions: Fall Restrictions Weight Bearing Restrictions: No      Mobility Bed Mobility                General bed mobility comments: pt in recliner pre/post session    Transfers Overall transfer level: Needs assistance Equipment used: Rolling walker (2 wheels) Transfers: Sit to/from Stand Sit to Stand: Supervision                  Balance Overall balance assessment: Needs assistance Sitting-balance support: Feet supported Sitting balance-Leahy Scale: Good     Standing balance support: Bilateral upper extremity supported, During functional activity, Reliant on assistive device for balance Standing balance-Leahy Scale: Fair                             ADL either performed or assessed with clinical judgement   ADL Overall ADL's : Needs assistance/impaired Eating/Feeding: Set up;Sitting   Grooming: Wash/dry hands;Wash/dry face;Sitting           Upper Body Dressing : Set up;Sitting   Lower Body Dressing: Set up Lower Body Dressing Details (indicate cue type and reason): socks Toilet Transfer: Supervision/safety;Min guard;Rolling walker (2 wheels) Toilet Transfer Details (indicate cue type and reason): simulated         Functional mobility during ADLs: Supervision/safety;Min guard;Rolling walker (2 wheels) (approx 15' in room with RW, intermittent vcs for safety)       Vision Patient Visual Report: Eye fatigue/eye pain/headache (RN notified)       Perception     Praxis      Pertinent Vitals/Pain Pain Assessment Pain Assessment: 0-10 Pain Score: 7  Pain Location: sensitive eyes Pain Intervention(s): Limited activity within patient's tolerance, Monitored during session, Repositioned     Hand Dominance     Extremity/Trunk Assessment Upper Extremity Assessment Upper Extremity Assessment:  Overall [facility] for tasks assessed   Lower Extremity Assessment Lower Extremity Assessment: Overall WFL for tasks assessed   Cervical / Trunk Assessment Cervical / Trunk Assessment: Normal   Communication Communication Communication: No  difficulties   Cognition Arousal/Alertness: Awake/alert Behavior During Therapy: WFL for tasks assessed/performed Overall Cognitive Status: Within Functional Limits for tasks assessed                                 General Comments: alert and oriented x4     General Comments       Exercises Other Exercises Other Exercises: edu re: role of OT, role of rehab, discharge recommendations, home safety, DME use, safe ADL completion   Shoulder Instructions      Home Living Family/patient expects to be discharged to:: Private residence Living Arrangements: Alone Available Help at Discharge: Neighbor;Friend(s);Available PRN/intermittently Type of Home: House Home Access: Ramped entrance     Home Layout: One level     Bathroom Shower/Tub: Teacher, early years/pre: Standard Bathroom Accessibility: Yes   Home Equipment: Conservation officer, nature (2 wheels)          Prior Functioning/Environment Prior Level of Function : Independent/Modified Independent             Mobility Comments: 2 falls in the last 6 months, amb with no AD, has access to RW ADLs Comments: MOD I-I in ADL/IADL, cooks, cleans, drives, grocery shops; pt is a Museum/gallery curator per report        OT Problem List: Decreased strength;Decreased activity tolerance;Decreased safety awareness;Decreased knowledge of use of DME or AE      OT Treatment/Interventions: Self-care/ADL training;Patient/family education;Therapeutic exercise;Balance training;DME and/or AE instruction;Therapeutic activities    OT Goals(Current goals can be found in the care plan section) Acute Rehab OT Goals Patient Stated Goal: get more rehab OT Goal Formulation: With patient Time For Goal Achievement: 04/17/22 Potential to Achieve Goals: Good ADL Goals Pt Will Perform Grooming: with modified independence;standing;sitting Pt Will Perform Lower Body Dressing: with modified independence;sit to/from stand Pt Will Transfer  to Toilet: with modified independence;ambulating Pt Will Perform Toileting - Clothing Manipulation and hygiene: with modified independence;sit to/from stand  OT Frequency: Min 2X/week    Co-evaluation              AM-PAC OT "6 Clicks" Daily Activity     Outcome Measure Help from another person eating meals?: None Help from another person taking care of personal grooming?: None Help from another person toileting, which includes using toliet, bedpan, or urinal?: A Little Help from another person bathing (including washing, rinsing, drying)?: A Little Help from another person to put on and taking off regular upper body clothing?: None Help from another person to put on and taking off regular lower body clothing?: None 6 Click Score: 22   End of Session Equipment Utilized During Treatment: Rolling walker (2 wheels) Nurse Communication: Mobility status  Activity Tolerance: Patient tolerated treatment well Patient left: in chair;with call bell/phone within reach;with chair alarm set  OT Visit Diagnosis: Unsteadiness on feet (R26.81);History of falling (Z91.81)                Time: 1300-1315 OT Time Calculation (min): 15 min Charges:  OT General Charges $OT Visit: 1 Visit OT Evaluation $OT Eval Low Complexity: [place], OTD OTR/L  04/03/22, 3:03 PM

## 2022-04-03 NOTE — Plan of Care (Signed)

## 2022-04-03 NOTE — Evaluation (Signed)
Physical Therapy Evaluation Patient Details Name: Tara [name] MRN: [id number] DOB: 12/14/1950 Today's Date: 04/03/2022  History of Present Illness  Pt is a 71 year old femla eadmitted with generalized weakness and fall. Found to have a UTI; PMH signficant for  chronic kidney disease, hypertension, obstructive sleep apnea, atrial fibrillation on anticoagulation   Clinical Impression  Tara [name] is 71 y.o. female admitted with above HPI and diagnosis. Pt has presented to ED 7x over the last month. Patient is currently limited by functional impairments below (see PT problem list). Patient lives alone and is intermittent assist from neighbors and friends at baseline. Currently she requires min assist for transfers and gait with RW. She requires use of UE's and momentum to complete 5x sit<>stand in 25.6 seconds indicating significant LE weakness and high risk for falls. Patient will benefit from continued skilled PT interventions to address impairments and progress independence with mobility, recommending ST rehab at SNF prior to return home to increased independence with mobility and reduce fall risk. Acute PT will follow and progress as able.        Recommendations for follow up therapy are one component of a multi-disciplinary discharge planning process, led by the attending physician.  Recommendations may be updated based on patient status, additional functional criteria and insurance authorization. PT Recommendation   Follow Up Recommendations Skilled nursing-short term rehab (<3 hours/day) Filed 04/03/2022 1034  Can patient physically be transported by private vehicle Yes Filed 04/03/2022 1034  Assistance recommended at discharge Intermittent Supervision/Assistance Filed 04/03/2022 1034  Patient can return home with the following A little help with walking and/or transfers, A little help with bathing/dressing/bathroom, Assistance with cooking/housework, Direct supervision/assist for medications  management, Assist for transportation, Help with stairs or ramp for entrance [facility] 04/03/2022 1034  Functional Status Assessment Patient has had a recent decline in their functional status and demonstrates the ability to make significant improvements in function in a reasonable and predictable amount of time. Filed 04/03/2022 1034  PT equipment None recommended by PT Filed 04/03/2022 1034   Precautions / Restrictions Precautions Precautions: Fall Restrictions Weight Bearing Restrictions: No      Mobility  Bed Mobility               General bed mobility comments: pt OOB in recliner    Transfers Overall transfer level: Needs assistance Equipment used: Rolling walker (2 wheels) Transfers: Sit to/from Stand Sit to Stand: Min guard, Min assist           General transfer comment: Min assist initial stand with cues for hand placement, pt requires bil UE for power up from recliner. close guard for repeated sit<>stands with UE use.    Ambulation/Gait Ambulation/Gait assistance: Min guard, Min assist Gait Distance (Feet): 150 Feet Assistive device: Rolling walker (2 wheels) Gait Pattern/deviations: Step-through pattern, Decreased stride length, Shuffle, Narrow base of support Gait velocity: decr     General Gait Details: cues to maintain safe position to RW and assist to steady. pt with flexed posture and drifting Rt/Lt intermittently. no significant LOB noted, knees flexed as pt became fatigued.  Stairs            Wheelchair Mobility    Modified Rankin (Stroke Patients Only)       Balance Overall balance assessment: Needs assistance Sitting-balance support: Feet supported Sitting balance-Leahy Scale: Good     Standing balance support: During functional activity, Reliant on assistive device for balance, Bilateral upper extremity supported Standing balance-Leahy Scale: Poor  Pertinent Vitals/Pain Pain  Assessment Pain Assessment: 0-10 Pain Score: 6  Pain Location: eyes are sensitive (headache from eyes) Pain Descriptors / Indicators: Headache Pain Intervention(s): Limited activity within patient's tolerance, Monitored during session, Repositioned    Home Living Family/patient expects to be discharged to:: Private residence Living Arrangements: Alone Available Help at Discharge: Neighbor;Friend(s);Available PRN/intermittently Type of Home: House Home Access: Ramped entrance       Home Layout: One level Home Equipment: Conservation officer, nature (2 wheels)      Prior Function Prior Level of Function : Independent/Modified Independent             Mobility Comments: 2 falls in the last 6 months, amb with no AD, has access to RW ADLs Comments: MOD I-I in ADL/IADL, cooks, cleans, drives, grocery shops; pt is a Museum/gallery curator per report     Hand Dominance        Extremity/Trunk Assessment   Upper Extremity Assessment Upper Extremity Assessment: Overall WFL for tasks assessed    Lower Extremity Assessment Lower Extremity Assessment: Overall WFL for tasks assessed    Cervical / Trunk Assessment Cervical / Trunk Assessment: Normal  Communication   Communication: No difficulties  Cognition Arousal/Alertness: Awake/alert Behavior During Therapy: WFL for tasks assessed/performed Overall Cognitive Status: Within Functional Limits for tasks assessed                                 General Comments: pleasant, A&Ox4          04/03/22 1034  PT - End of Session  Equipment Utilized During Treatment Gait belt  Activity Tolerance Patient tolerated treatment well  Patient left in chair;with call bell/phone within reach  Nurse Communication Mobility status  PT Assessment  PT Recommendation/Assessment Patient needs continued PT services  PT Visit Diagnosis Muscle weakness (generalized) (M62.81);Difficulty in walking, not elsewhere classified (R26.2);Unsteadiness on  feet (R26.81)  PT Problem List Decreased strength;Decreased activity tolerance;Decreased balance;Decreased mobility;Decreased knowledge of use of DME;Decreased safety awareness;Decreased knowledge of precautions  PT Plan  PT Frequency (ACUTE ONLY) Min 3X/week  PT Treatment/Interventions (ACUTE ONLY) Gait training;DME instruction;Stair training;Functional mobility training;Therapeutic activities;Therapeutic exercise;Balance training;Patient/family education  AM-PAC PT "6 Clicks" Mobility Outcome Measure (Version 2)  Help needed turning from your back to your side while in a flat bed without using bedrails? 3  Help needed moving from lying on your back to sitting on the side of a flat bed without using bedrails? 3  Help needed moving to and from a bed to a chair (including a wheelchair)? 3  Help needed standing up from a chair using your arms (e.g., wheelchair or bedside chair)? 3  Help needed to walk in hospital room? 3  Help needed climbing 3-5 steps with a railing?  2  6 Click Score 17  Consider Recommendation of Discharge To: Home with [facility]  Progressive Mobility  What is the highest level of mobility based on the progressive mobility assessment? Level 5 (Walks with assist in room/hall) - Balance while stepping forward/back and can walk in room with assist - Complete  Activity Ambulated with assistance in hallway  PT Recommendation  Follow Up Recommendations Skilled nursing-short term rehab (<3 hours/day)  Can patient physically be transported by private vehicle Yes  Assistance recommended at discharge Intermittent Supervision/Assistance  Patient can return home with the following A little help with walking and/or transfers;A little help with bathing/dressing/bathroom;Assistance with cooking/housework;Direct supervision/assist for medications management;Assist for transportation;Help with  stairs or ramp for entrance  Functional Status Assessment Patient has had a recent decline in their  functional status and demonstrates the ability to make significant improvements in function in a reasonable and predictable amount of time.  PT equipment None recommended by PT  Individuals Consulted  Consulted and Agree with Results and Recommendations Patient  Acute Rehab PT Goals  Patient Stated Goal get well enough to get home, get stronger  PT Goal Formulation With patient  Time For Goal Achievement 04/17/22  Potential to Achieve Goals Good  PT Time Calculation  PT Start Time (ACUTE ONLY) 1030  PT Stop Time (ACUTE ONLY) 1102  PT Time Calculation (min) (ACUTE ONLY) 32 min  PT General Charges  $$ ACUTE PT VISIT 1 Visit  PT Evaluation  $PT Eval Moderate Complexity 1 Mod  PT Treatments  $Gait Training 8-22 mins     Verner Mould, DPT Acute Rehabilitation Services Office [phone] Pager [phone]  04/03/22 4:07 PM
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
## 2022-04-04 DIAGNOSIS — R2689 Other abnormalities of gait and mobility: Secondary | ICD-10-CM | POA: Diagnosis present

## 2022-04-04 DIAGNOSIS — I129 Hypertensive chronic kidney disease with stage 1 through stage 4 chronic kidney disease, or unspecified chronic kidney disease: Secondary | ICD-10-CM | POA: Diagnosis present

## 2022-04-04 DIAGNOSIS — N179 Acute kidney failure, unspecified: Secondary | ICD-10-CM | POA: Diagnosis present

## 2022-04-04 DIAGNOSIS — R5381 Other malaise: Secondary | ICD-10-CM | POA: Diagnosis present

## 2022-04-04 DIAGNOSIS — N1831 Chronic kidney disease, stage 3a: Secondary | ICD-10-CM | POA: Diagnosis present

## 2022-04-04 DIAGNOSIS — F32A Depression, unspecified: Secondary | ICD-10-CM | POA: Diagnosis present

## 2022-04-04 DIAGNOSIS — F320 Major depressive disorder, single episode, mild: Secondary | ICD-10-CM | POA: Diagnosis not present

## 2022-04-04 DIAGNOSIS — Z882 Allergy status to sulfonamides status: Secondary | ICD-10-CM | POA: Diagnosis not present

## 2022-04-04 DIAGNOSIS — E861 Hypovolemia: Secondary | ICD-10-CM | POA: Diagnosis present

## 2022-04-04 DIAGNOSIS — Z91018 Allergy to other foods: Secondary | ICD-10-CM | POA: Diagnosis not present

## 2022-04-04 DIAGNOSIS — I10 Essential (primary) hypertension: Secondary | ICD-10-CM

## 2022-04-04 DIAGNOSIS — E785 Hyperlipidemia, unspecified: Secondary | ICD-10-CM | POA: Diagnosis present

## 2022-04-04 DIAGNOSIS — E1122 Type 2 diabetes mellitus with diabetic chronic kidney disease: Secondary | ICD-10-CM | POA: Diagnosis present

## 2022-04-04 DIAGNOSIS — E876 Hypokalemia: Secondary | ICD-10-CM | POA: Diagnosis present

## 2022-04-04 DIAGNOSIS — G4733 Obstructive sleep apnea (adult) (pediatric): Secondary | ICD-10-CM | POA: Diagnosis present

## 2022-04-04 DIAGNOSIS — I4819 Other persistent atrial fibrillation: Secondary | ICD-10-CM | POA: Diagnosis present

## 2022-04-04 DIAGNOSIS — D869 Sarcoidosis, unspecified: Secondary | ICD-10-CM | POA: Diagnosis present

## 2022-04-04 DIAGNOSIS — H409 Unspecified glaucoma: Secondary | ICD-10-CM | POA: Diagnosis present

## 2022-04-04 DIAGNOSIS — B9620 Unspecified Escherichia coli [E. coli] as the cause of diseases classified elsewhere: Secondary | ICD-10-CM | POA: Diagnosis present

## 2022-04-04 DIAGNOSIS — E669 Obesity, unspecified: Secondary | ICD-10-CM | POA: Diagnosis present

## 2022-04-04 DIAGNOSIS — I480 Paroxysmal atrial fibrillation: Secondary | ICD-10-CM | POA: Diagnosis not present

## 2022-04-04 DIAGNOSIS — Z20822 Contact with and (suspected) exposure to covid-19: Secondary | ICD-10-CM | POA: Diagnosis present

## 2022-04-04 DIAGNOSIS — N390 Urinary tract infection, site not specified: Secondary | ICD-10-CM | POA: Diagnosis present

## 2022-04-04 DIAGNOSIS — Z6831 Body mass index (BMI) 31.0-31.9, adult: Secondary | ICD-10-CM | POA: Diagnosis not present

## 2022-04-04 DIAGNOSIS — Z888 Allergy status to other drugs, medicaments and biological substances status: Secondary | ICD-10-CM | POA: Diagnosis not present

## 2022-04-04 DIAGNOSIS — E860 Dehydration: Secondary | ICD-10-CM | POA: Diagnosis present

## 2022-04-04 DIAGNOSIS — Z79899 Other long term (current) drug therapy: Secondary | ICD-10-CM | POA: Diagnosis not present

## 2022-04-04 LAB — BASIC METABOLIC PANEL
Anion gap: 10 (ref 5–15)
BUN: 20 mg/dL (ref 8–23)
CO2: 25 mmol/L (ref 22–32)
Calcium: 9 mg/dL (ref 8.9–10.3)
Chloride: 103 mmol/L (ref 98–111)
Creatinine, Ser: 1.28 mg/dL — ABNORMAL HIGH (ref 0.44–1.00)
GFR, Estimated: 45 mL/min — ABNORMAL LOW (ref >60)
Glucose, Bld: 108 mg/dL — ABNORMAL HIGH (ref 70–99)
Potassium: 3.5 mmol/L (ref 3.5–5.1)
Sodium: 138 mmol/L (ref 135–145)

## 2022-04-04 LAB — CBC
HCT: 40.5 % (ref 36.0–46.0)
Hemoglobin: 13.9 g/dL (ref 12.0–15.0)
MCH: 29 pg (ref 26.0–34.0)
MCHC: 34.3 g/dL (ref 30.0–36.0)
MCV: 84.6 fL (ref 80.0–100.0)
Platelets: 183 10*3/uL (ref 150–400)
RBC: 4.79 MIL/uL (ref 3.87–5.11)
RDW: 15.2 % (ref 11.5–15.5)
WBC: 7.5 10*3/uL (ref 4.0–10.5)
nRBC: 0 % (ref 0.0–0.2)

## 2022-04-04 NOTE — Progress Notes (Signed)
Occupational Therapy Treatment Patient Details Name: Tara [name] MRN: [id number] DOB: 10-13-1950 Today's Date: 04/04/2022   History of present illness Pt is a 71 year old female admitted with generalized weakness and fall. Found to have a UTI; PMH signficant for  chronic kidney disease, hypertension, obstructive sleep apnea, atrial fibrillation on anticoagulation.   OT comments  Pt with B UE tremor making self feeding challenging and eyes discomfort with photophobia. Assisted to position and set up for self feeding, grooming seated in chair and adjusted lighting. Pt with new dependence on RW, typically functions independently. Pt is agreeable to going to short term rehab. Updated discharge recommendation to reflect.    Recommendations for follow up therapy are one component of a multi-disciplinary discharge planning process, led by the attending physician.  Recommendations may be updated based on patient status, additional functional criteria and insurance authorization.    Follow Up Recommendations  Skilled nursing-short term rehab (<3 hours/day)    Assistance Recommended at Discharge Frequent or constant Supervision/Assistance  Patient can return home with the following  A little help with bathing/dressing/bathroom;Assistance with cooking/housework;Assist for transportation;Help with stairs or ramp for entrance;A little help with walking and/or transfers   Equipment Recommendations  Other (comment) (defer to next venue)    Recommendations for Other Services      Precautions / Restrictions Precautions Precautions: Fall Restrictions Weight Bearing Restrictions: No       Mobility Bed Mobility               General bed mobility comments: pt in recliner pre/post session    Transfers Overall transfer level: Needs assistance Equipment used: Rolling walker (2 wheels) Transfers: Sit to/from Stand Sit to Stand: Min guard           General transfer comment: reliant on  UEs     Balance Overall balance assessment: Needs assistance Sitting-balance support: Feet supported Sitting balance-Leahy Scale: Good     Standing balance support: Bilateral upper extremity supported, During functional activity, Reliant on assistive device for balance Standing balance-Leahy Scale: Poor Standing balance comment: reliant on RW                           ADL either performed or assessed with clinical judgement   ADL Overall ADL's : Needs assistance/impaired Eating/Feeding: Minimal assistance;Sitting Eating/Feeding Details (indicate cue type and reason): assist to cut food and position to reduce spillage Grooming: Wash/dry hands;Sitting;Set up                   Toilet Transfer: Minimal assistance;Ambulation;Rolling walker (2 wheels)           Functional mobility during ADLs: Minimal assistance;Rolling walker (2 wheels);Min guard      Extremity/Trunk Assessment Upper Extremity Assessment Upper Extremity Assessment: RUE deficits/detail;LUE deficits/detail RUE Deficits / Details: significant tremor RUE Coordination: decreased fine motor LUE Deficits / Details: significant tremor LUE Coordination: decreased fine motor   Lower Extremity Assessment Lower Extremity Assessment: Defer to PT evaluation        Vision       Perception     Praxis      Cognition Arousal/Alertness: Awake/alert Behavior During Therapy: WFL for tasks assessed/performed Overall Cognitive Status: Within Functional Limits for tasks assessed                                 General Comments: pt not asking for  assistance with lunch meal or sharing that she needed a blanket        Exercises Other Exercises Other Exercises: pt aware of recommendation of rehab in SNF and is agreeable    Shoulder Instructions       General Comments      Pertinent Vitals/ Pain       Pain Assessment Pain Assessment: Faces Pain Score: 2  Pain Location:  eyes Pain Descriptors / Indicators: Discomfort Pain Intervention(s): Monitored during session, Other (comment) (adjusted lighting)  Home Living                                          Prior Functioning/Environment              Frequency  Min 2X/week        Progress Toward Goals  OT Goals(current goals can now be found in the care plan section)  Progress towards OT goals: Progressing toward goals  Acute Rehab OT Goals OT Goal Formulation: With patient Time For Goal Achievement: 04/17/22 Potential to Achieve Goals: Good  Plan Discharge plan needs to be updated    Co-evaluation                 AM-PAC OT "6 Clicks" Daily Activity     Outcome Measure   Help from another person eating meals?: A Little Help from another person taking care of personal grooming?: A Little Help from another person toileting, which includes using toliet, bedpan, or urinal?: A Little Help from another person bathing (including washing, rinsing, drying)?: A Little Help from another person to put on and taking off regular upper body clothing?: A Little Help from another person to put on and taking off regular lower body clothing?: A Little 6 Click Score: 18    End of Session Equipment Utilized During Treatment: Gait belt;Rolling walker (2 wheels)  OT Visit Diagnosis: Unsteadiness on feet (R26.81);History of falling (Z91.81)   Activity Tolerance Patient tolerated treatment well   Patient Left in chair;with call bell/phone within reach;with chair alarm set   Nurse Communication          Time: 6468-0321 OT Time Calculation (min): 23 min  Charges: OT General Charges $OT Visit: 1 Visit OT Treatments $Self Care/Home Management : 23-37 mins  Cleta Alberts, OTR/L Acute Rehabilitation Services Office: [phone]   Malka So 04/04/2022, 3:27 PM

## 2022-04-04 NOTE — Progress Notes (Signed)
[facility]                                                                              Tara [name], is a 71 y.o. female, DOB - 04-11-1951, IEP:[id number] Admit date - 04/01/2022    Outpatient Primary MD for the patient is Tara Levins, MD  LOS - 0  days  Chief Complaint  Patient presents with   Fall       Brief summary  71 year old female with a history of chronic kidney disease, hypertension, obstructive sleep apnea, atrial fibrillation on anticoagulation, admitted to the hospital with generalized weakness and a fall.  Recently seen in the hospital and was evaluated for headache.  She was treated in the emergency room and discharged home in improved condition.  She returned to the hospital after having a fall.  Concern for possible underlying UTI.  She is generally weak and awaiting PT eval   Assessment & Plan    Principal Problem:   Complicated UTI (urinary tract infection) -Urine culture positive for E. coli with resistance, currently on IV cefepime  Active Problems: Presyncope and fall -Likely related to hypovolemia and orthostasis -Patient was seen by cardiology, recommended holding Lasix -Continue to encourage p.o. diet, PT OT  Persistent atrial fibrillation - Chads2vasc 3, rate controlled -Continue diltiazem, flecainide -Continue Eliquis 5 mg twice daily for anticoagulation  Generalized debility, gait instability -PT recommended SNF for rehab  Acute kidney injury on CKD stage IIIa -Baseline creatinine 0.9, on admit 1.29 -Patient was placed on IV fluid hydration, improving to 1.2    Depression -Continue Ingrezza    Essential hypertension -BP stable, continue Cardizem, hydralazine   Obesity Estimated body mass index is 31.7 kg/m as calculated from the following:   Height as of this encounter: 5\' 6"  (1.676 m).   Weight as of this encounter: 89.1 kg.  Code Status: Full code DVT Prophylaxis:   apixaban (ELIQUIS) tablet 5 mg   Level  of Care: Level of care: Telemetry Medical Family Communication: Updated patient   Disposition Plan:      Remains inpatient appropriate: Pending SNF   Procedures:  None Consultants:   Cardiology  Antimicrobials:   Anti-infectives (From admission, onward)    Start     Dose/Rate Route Frequency Ordered Stop   04/03/22 1000  ceFEPIme (MAXIPIME) 2 g in sodium chloride 0.9 % 100 mL IVPB        2 g 200 mL/hr over 30 Minutes Intravenous Every 12 hours 04/03/22 0825 04/08/22 0959   04/02/22 2000  cefTRIAXone (ROCEPHIN) 1 g in sodium chloride 0.9 % 100 mL IVPB  Status:  Discontinued        1 g 200 mL/hr over 30 Minutes Intravenous Every 24 hours 04/01/22 2048 04/03/22 0825   04/01/22 1915  cefTRIAXone (ROCEPHIN) 1 g in sodium chloride 0.9 % 100 mL IVPB        1 g 200 mL/hr over 30 Minutes Intravenous  Once 04/01/22 1914 04/01/22 2113          Medications  apixaban  5 mg Oral BID   brimonidine  1 drop Both Eyes Q12H  And   timolol  1 drop Both Eyes Q12H   clorazepate  7.5 mg Oral BID   desvenlafaxine  100 mg Oral QHS   diltiazem  240 mg Oral Daily   ferrous sulfate  325 mg Oral Q breakfast   flecainide  50 mg Oral Q12H   hydrALAZINE  100 mg Oral BID   latanoprost  1 drop Both Eyes QHS   valbenazine  40 mg Oral Daily      Subjective:   Tara [name] was seen and examined today.  Feeling generalized weakness otherwise no acute issues.  No fevers or chills or any pain.  Denies any chest pain, shortness of breath, nausea or vomiting.   Objective:   Vitals:   04/03/22 1920 04/03/22 2100 04/04/22 0327 04/04/22 0752  BP: (!) 151/95  (!) 154/84 (!) 184/97  Pulse: 66 68 68 83  Resp: 14 16 15 16   Temp: 98 F (36.7 C)  97.9 F (36.6 C) 97.8 F (36.6 C)  TempSrc: Oral  Oral Oral  SpO2: 99% 98% 100% 98%  Weight:      Height:        Intake/Output Summary (Last 24 hours) at 04/04/2022 1232 Last data filed at 04/04/2022 0900 Gross per 24 hour  Intake 120 ml  Output --   Net 120 ml     Wt Readings from Last 3 Encounters:  04/03/22 89.1 kg  03/31/22 88.7 kg  03/26/22 88.7 kg     Exam General: Alert and oriented x 3, NAD Cardiovascular: S1 S2 auscultated,  RRR Respiratory: Clear to auscultation bilaterally, no wheezing, rales  Gastrointestinal: Soft, nontender, nondistended, + bowel sounds Ext: no pedal edema bilaterally Neuro: Strength 5/5 upper and lower extremities bilaterally Psych: Normal affect and demeanor, alert and oriented x3     Data Reviewed:  I have personally reviewed following labs    CBC Lab Results  Component Value Date   WBC 7.5 04/04/2022   RBC 4.79 04/04/2022   HGB 13.9 04/04/2022   HCT 40.5 04/04/2022   MCV 84.6 04/04/2022   MCH 29.0 04/04/2022   PLT 183 04/04/2022   MCHC 34.3 04/04/2022   RDW 15.2 04/04/2022   LYMPHSABS 1.1 04/01/2022   MONOABS 1.0 04/01/2022   EOSABS 0.0 04/01/2022   BASOSABS 0.1 04/01/2022     Last metabolic panel Lab Results  Component Value Date   NA 138 04/04/2022   K 3.5 04/04/2022   CL 103 04/04/2022   CO2 25 04/04/2022   BUN 20 04/04/2022   CREATININE 1.28 (H) 04/04/2022   GLUCOSE 108 (H) 04/04/2022   GFRNONAA 45 (L) 04/04/2022   GFRAA 48 (L) 01/07/2017   CALCIUM 9.0 04/04/2022   PHOS 4.3 10/06/2020   PROT 5.8 (L) 04/02/2022   ALBUMIN 3.2 (L) 04/02/2022   LABGLOB 2.4 01/07/2017   AGRATIO 1.8 01/07/2017   BILITOT 0.9 04/02/2022   ALKPHOS 78 04/02/2022   AST 36 04/02/2022   ALT 21 04/02/2022   ANIONGAP 10 04/04/2022       Manda Holstad M.D. [facility] 04/04/2022, 12:32 PM  Available via Epic secure chat 7am-7pm After 7 pm, please refer to night coverage provider listed on amion.

## 2022-04-04 NOTE — Progress Notes (Signed)
Pt is not sure she wants to wear cpap tonight due to being uncomfortable wearing it while asleep.

## 2022-04-04 NOTE — Plan of Care (Signed)

## 2022-04-05 DIAGNOSIS — F320 Major depressive disorder, single episode, mild: Secondary | ICD-10-CM | POA: Diagnosis not present

## 2022-04-05 DIAGNOSIS — N179 Acute kidney failure, unspecified: Secondary | ICD-10-CM | POA: Diagnosis not present

## 2022-04-05 DIAGNOSIS — N390 Urinary tract infection, site not specified: Secondary | ICD-10-CM | POA: Diagnosis not present

## 2022-04-05 DIAGNOSIS — I480 Paroxysmal atrial fibrillation: Secondary | ICD-10-CM | POA: Diagnosis not present

## 2022-04-05 MED ORDER — HYDRALAZINE HCL 50 MG PO TABS
100.0000 mg | ORAL_TABLET | Freq: Three times a day (TID) | ORAL | Status: DC
  Administered 2022-04-05 – 2022-04-09 (×13): 100 mg via ORAL
  Filled 2022-04-05 (×13): qty 2

## 2022-04-05 MED ORDER — MELATONIN 5 MG PO TABS
5.0000 mg | ORAL_TABLET | Freq: Every day | ORAL | Status: DC
  Administered 2022-04-05 – 2022-04-08 (×4): 5 mg via ORAL
  Filled 2022-04-05 (×4): qty 1

## 2022-04-05 MED ORDER — POTASSIUM CHLORIDE CRYS ER 20 MEQ PO TBCR
40.0000 meq | EXTENDED_RELEASE_TABLET | Freq: Once | ORAL | Status: AC
  Administered 2022-04-05: 40 meq via ORAL
  Filled 2022-04-05: qty 2

## 2022-04-05 NOTE — Plan of Care (Signed)

## 2022-04-05 NOTE — Progress Notes (Signed)
[facility]                                                                              Tara [name], is a 71 y.o. female, DOB - 26-Apr-1951, [id number] Admit date - 04/01/2022    Outpatient Primary MD for the patient is Corwin Levins, MD  LOS - 1  days  Chief Complaint  Patient presents with   Fall       Brief summary  71 year old female with a history of chronic kidney disease, hypertension, obstructive sleep apnea, atrial fibrillation on anticoagulation, admitted to the hospital with generalized weakness and a fall.  Recently seen in the hospital and was evaluated for headache.  She was treated in the emergency room and discharged home in improved condition.  She returned to the hospital after having a fall.  Concern for possible underlying UTI.  She is generally weak and awaiting PT eval   Assessment & Plan    Principal Problem:   Complicated UTI (urinary tract infection) -Urine culture positive for E. coli with resistance, -Currently on IV cefepime  Active Problems: Presyncope and fall -Likely related to hypovolemia and orthostasis -Patient was seen by cardiology, recommended holding Lasix -Continue to encourage p.o. diet, PT OT  Persistent atrial fibrillation - Chads2vasc 3, rate controlled -Continue diltiazem, flecainide -Continue Eliquis 5 mg twice daily for anticoagulation  Generalized debility, gait instability -PT recommended SNF for rehab, pending bed availability  Acute kidney injury on CKD stage IIIa -Baseline creatinine 0.9, on admit 1.29 -Patient was placed on IV fluid hydration, improving to 1.2    Depression -Continue Ingrezza    Essential hypertension -BP elevated, continue Cardizem - will increase hydralazine to 3 times daily   Obesity Estimated body mass index is 31.7 kg/m as calculated from the following:   Height as of this encounter: 5\' 6"  (1.676 m).   Weight as of this encounter: 89.1 kg.  Code Status: Full  code DVT Prophylaxis:   apixaban (ELIQUIS) tablet 5 mg   Level of Care: Level of care: Telemetry Medical Family Communication: Updated patient   Disposition Plan:      Remains inpatient appropriate: Pending SNF   Procedures:  None Consultants:   Cardiology  Antimicrobials:   Anti-infectives (From admission, onward)    Start     Dose/Rate Route Frequency Ordered Stop   04/03/22 1000  ceFEPIme (MAXIPIME) 2 g in sodium chloride 0.9 % 100 mL IVPB        2 g 200 mL/hr over 30 Minutes Intravenous Every 12 hours 04/03/22 0825 04/08/22 0959   04/02/22 2000  cefTRIAXone (ROCEPHIN) 1 g in sodium chloride 0.9 % 100 mL IVPB  Status:  Discontinued        1 g 200 mL/hr over 30 Minutes Intravenous Every 24 hours 04/01/22 2048 04/03/22 0825   04/01/22 1915  cefTRIAXone (ROCEPHIN) 1 g in sodium chloride 0.9 % 100 mL IVPB        1 g 200 mL/hr over 30 Minutes Intravenous  Once 04/01/22 1914 04/01/22 2113          Medications  apixaban  5 mg Oral BID  brimonidine  1 drop Both Eyes Q12H   And   timolol  1 drop Both Eyes Q12H   clorazepate  7.5 mg Oral BID   desvenlafaxine  100 mg Oral QHS   diltiazem  240 mg Oral Daily   ferrous sulfate  325 mg Oral Q breakfast   flecainide  50 mg Oral Q12H   hydrALAZINE  100 mg Oral BID   latanoprost  1 drop Both Eyes QHS   melatonin  5 mg Oral QHS   valbenazine  40 mg Oral Daily      Subjective:   Tara [name] was seen and examined today.  Generalized weakness otherwise no acute issues, no fevers or chills, tolerating diet.  Awaiting SNF.    Objective:   Vitals:   04/04/22 2119 04/05/22 0502 04/05/22 0808 04/05/22 0958  BP: 126/66 (!) 147/89 (!) 147/89 (!) 163/112  Pulse: 71 74 75   Resp: 15 16 17    Temp: 98.2 F (36.8 C) 97.9 F (36.6 C)    TempSrc: Oral Oral    SpO2: 98% 98% 97%   Weight:      Height:        Intake/Output Summary (Last 24 hours) at 04/05/2022 1116 Last data filed at 04/04/2022 1500 Gross per 24 hour   Intake 240 ml  Output 500 ml  Net -260 ml     Wt Readings from Last 3 Encounters:  04/03/22 89.1 kg  03/31/22 88.7 kg  03/26/22 88.7 kg   Physical Exam General: Alert and oriented x 3, NAD Cardiovascular: S1 S2 clear, RRR.  Respiratory: CTAB, no wheezing, rales Gastrointestinal: Soft, nontender, nondistended, NBS Ext: no pedal edema bilaterally Neuro: no new deficits Psych: Normal affect and demeanor, alert and oriented x3    Data Reviewed:  I have personally reviewed following labs    CBC Lab Results  Component Value Date   WBC 7.5 04/04/2022   RBC 4.79 04/04/2022   HGB 13.9 04/04/2022   HCT 40.5 04/04/2022   MCV 84.6 04/04/2022   MCH 29.0 04/04/2022   PLT 183 04/04/2022   MCHC 34.3 04/04/2022   RDW 15.2 04/04/2022   LYMPHSABS 1.1 04/01/2022   MONOABS 1.0 04/01/2022   EOSABS 0.0 04/01/2022   BASOSABS 0.1 04/01/2022     Last metabolic panel Lab Results  Component Value Date   NA 138 04/04/2022   K 3.5 04/04/2022   CL 103 04/04/2022   CO2 25 04/04/2022   BUN 20 04/04/2022   CREATININE 1.28 (H) 04/04/2022   GLUCOSE 108 (H) 04/04/2022   GFRNONAA 45 (L) 04/04/2022   GFRAA 48 (L) 01/07/2017   CALCIUM 9.0 04/04/2022   PHOS 4.3 10/06/2020   PROT 5.8 (L) 04/02/2022   ALBUMIN 3.2 (L) 04/02/2022   LABGLOB 2.4 01/07/2017   AGRATIO 1.8 01/07/2017   BILITOT 0.9 04/02/2022   ALKPHOS 78 04/02/2022   AST 36 04/02/2022   ALT 21 04/02/2022   ANIONGAP 10 04/04/2022       Tara [name] M.D. [facility] 04/05/2022, 11:16 AM  Available via Epic secure chat 7am-7pm After 7 pm, please refer to night coverage provider listed on amion.
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
## 2022-04-05 NOTE — TOC Progression Note (Signed)
Transition of Care [facility]) - Progression Note    Patient Details  Name: Chrystie Hagwood MRN: [id number] Date of Birth: 08-21-1950  Transition of Care [facility]) CM/SW [place], [place] Phone Number: 04/05/2022, 3:43 PM  Clinical Narrative:     CSW provided bed offers to pt, she discussed with her neighbors and have decided on [facility]. All questions answered, pt requesting non emergency ambulance transport. CSW left VM for admissions, requesting they call CSW back in order to start auth. TOC will continue to follow for DC needs.  Expected Discharge Plan: Skilled Nursing Facility Barriers to Discharge: SNF Pending bed offer, Insurance Authorization  Expected Discharge Plan and Services Expected Discharge Plan: [place] Choice: [place] arrangements for the past 2 months: Single Family Home                                       Social Determinants of Health (SDOH) Interventions    Readmission Risk Interventions     No data to display

## 2022-04-05 NOTE — Progress Notes (Signed)
Pt refusing CPAP for tonight she said she has trouble trying to fall asleep as well as communicating with staff when wearing the CPAP.

## 2022-04-05 NOTE — Progress Notes (Signed)
Physical Therapy Treatment Patient Details Name: Tara [name] MRN: [id number] DOB: Aug 14, 1950 Today's Date: 04/05/2022   History of Present Illness Pt is a 71 year old female admitted with generalized weakness and fall. Found to have a UTI; PMH signficant for  chronic kidney disease, hypertension, obstructive sleep apnea, atrial fibrillation on anticoagulation.    PT Comments    Pt progressing towards goals, however, continues to be limited by fatigue. Demonstrated deficits in strength and balance. Pt also requiring safety cues throughout gait for safe use of RW. Current recommendations for SNF appropriate. Will continue to follow acutely.     Recommendations for follow up therapy are one component of a multi-disciplinary discharge planning process, led by the attending physician.  Recommendations may be updated based on patient status, additional functional criteria and insurance authorization.  Follow Up Recommendations  Skilled nursing-short term rehab (<3 hours/day) Can patient physically be transported by private vehicle: Yes   Assistance Recommended at Discharge Intermittent Supervision/Assistance  Patient can return home with the following A little help with walking and/or transfers;A little help with bathing/dressing/bathroom;Assistance with cooking/housework;Direct supervision/assist for medications management;Assist for transportation;Help with stairs or ramp for entrance   Equipment Recommendations  None recommended by PT    Recommendations for Other Services       Precautions / Restrictions Precautions Precautions: Fall Restrictions Weight Bearing Restrictions: No     Mobility  Bed Mobility Overal bed mobility: Needs Assistance Bed Mobility: Supine to Sit     Supine to sit: Supervision     General bed mobility comments: Supervision for safety    Transfers Overall transfer level: Needs assistance Equipment used: Rolling walker (2 wheels) Transfers: Sit  to/from Stand Sit to Stand: Min assist           General transfer comment: Pt pulling up on RW and required min A for lift assist and steadying.    Ambulation/Gait Ambulation/Gait assistance: Min guard Gait Distance (Feet): 100 Feet Assistive device: Rolling walker (2 wheels) Gait Pattern/deviations: Step-through pattern, Decreased stride length, Shuffle, Narrow base of support Gait velocity: Decreased     General Gait Details: Cues for upright posture. Tended to drift to the L and required cues to stay on straight path. Increased fatigue noted after short distance.   Stairs             Wheelchair Mobility    Modified Rankin (Stroke Patients Only)       Balance Overall balance assessment: Needs assistance Sitting-balance support: Feet supported Sitting balance-Leahy Scale: Good     Standing balance support: Bilateral upper extremity supported, During functional activity, Reliant on assistive device for balance Standing balance-Leahy Scale: Poor Standing balance comment: reliant on RW                            Cognition Arousal/Alertness: Awake/alert Behavior During Therapy: WFL for tasks assessed/performed Overall Cognitive Status: No family/caregiver present to determine baseline cognitive functioning                                          Exercises General Exercises - Lower Extremity Ankle Circles/Pumps: AROM, Both, 20 reps Long Arc Quad: AROM, Both, 10 reps, Seated Hip Flexion/Marching: AROM, Both, 10 reps, Seated Other Exercises Other Exercises: pt aware of recommendation of rehab in SNF and is agreeable    General Comments  Pertinent Vitals/Pain Pain Assessment Pain Assessment: Faces Faces Pain Scale: Hurts a little bit Pain Location: eyes Pain Descriptors / Indicators: Discomfort Pain Intervention(s): Limited activity within patient's tolerance, Monitored during session, Repositioned    Home Living                           Prior Function            PT Goals (current goals can now be found in the care plan section) Acute Rehab PT Goals Patient Stated Goal: get well enough to get home, get stronger PT Goal Formulation: With patient Time For Goal Achievement: 04/17/22 Potential to Achieve Goals: Good Progress towards PT goals: Progressing toward goals    Frequency    Min 2X/week      PT Plan Current plan remains appropriate    Co-evaluation              AM-PAC PT "6 Clicks" Mobility   Outcome Measure  Help needed turning from your back to your side while in a flat bed without using bedrails?: A Little Help needed moving from lying on your back to sitting on the side of a flat bed without using bedrails?: A Little Help needed moving to and from a bed to a chair (including a wheelchair)?: A Little Help needed standing up from a chair using your arms (e.g., wheelchair or bedside chair)?: A Little Help needed to walk in hospital room?: A Little Help needed climbing 3-5 steps with a railing? : A Lot 6 Click Score: 17    End of Session Equipment Utilized During Treatment: Gait belt Activity Tolerance: Patient tolerated treatment well Patient left: in chair;with call bell/phone within reach;with chair alarm set Nurse Communication: Mobility status PT Visit Diagnosis: Muscle weakness (generalized) (M62.81);Difficulty in walking, not elsewhere classified (R26.2);Unsteadiness on feet (R26.81)     Time: 3267-1245 PT Time Calculation (min) (ACUTE ONLY): 16 min  Charges:  $Gait Training: 8-22 mins                     Reuel Derby, PT, DPT  Acute Rehabilitation Services  Office: [phone]    Rudean Hitt 04/05/2022, 4:08 PM

## 2022-04-06 DIAGNOSIS — I10 Essential (primary) hypertension: Secondary | ICD-10-CM | POA: Diagnosis not present

## 2022-04-06 DIAGNOSIS — N390 Urinary tract infection, site not specified: Secondary | ICD-10-CM | POA: Diagnosis not present

## 2022-04-06 DIAGNOSIS — I480 Paroxysmal atrial fibrillation: Secondary | ICD-10-CM | POA: Diagnosis not present

## 2022-04-06 DIAGNOSIS — F320 Major depressive disorder, single episode, mild: Secondary | ICD-10-CM | POA: Diagnosis not present

## 2022-04-06 NOTE — Progress Notes (Signed)
[facility]                                                                              Stephonie [name], is a 71 y.o. female, DOB - 30-Sep-1950, ZOX:[id number] Admit date - 04/01/2022    Outpatient Primary MD for the patient is Tara Levins, MD  LOS - 2  days  Chief Complaint  Patient presents with   Fall       Brief summary  71 year old female with a history of chronic kidney disease, hypertension, obstructive sleep apnea, atrial fibrillation on anticoagulation, admitted to the hospital with generalized weakness and a fall.  Recently seen in the hospital and was evaluated for headache.  She was treated in the emergency room and discharged home in improved condition.  She returned to the hospital after having a fall.  Concern for possible underlying UTI.  She is generally weak and awaiting PT eval   Assessment & Plan    Principal Problem:   Complicated UTI (urinary tract infection) -Urine culture positive for E. coli with resistance -Currently on IV cefepime, day #4 today, continue for 3 more days to complete 7-day course.   Active Problems: Presyncope and fall -Likely related to hypovolemia and orthostasis -Patient was seen by cardiology, recommended holding Lasix -Continue to encourage p.o. diet, PT OT  Persistent atrial fibrillation - Chads2vasc 3, rate controlled -Continue diltiazem, flecainide (flecainide was resumed this admission by cardiology) -Continue Eliquis 5 mg twice daily for anticoagulation  Generalized debility, gait instability -PT recommended SNF for rehab, currently awaiting bed  Acute kidney injury on CKD stage IIIa -Baseline creatinine 0.9, on admit 1.29 -Patient was placed on IV fluid hydration, improving to 1.2    Depression -Continue Ingrezza    Essential hypertension -BP elevated, continue Cardizem - will increase hydralazine to 3 times daily   Obesity Estimated body mass index is 31.7 kg/m as calculated from the  following:   Height as of this encounter: 5\' 6"  (1.676 m).   Weight as of this encounter: 89.1 kg.  Code Status: Full code DVT Prophylaxis:   apixaban (ELIQUIS) tablet 5 mg   Level of Care: Level of care: Telemetry Medical Family Communication: Updated patient.   Disposition Plan:      Remains inpatient appropriate: Medically stable, awaiting skilled nursing facility bed, pending insurance approval.    Procedures:  None Consultants:   Cardiology  Antimicrobials:   Anti-infectives (From admission, onward)    Start     Dose/Rate Route Frequency Ordered Stop   04/03/22 1000  ceFEPIme (MAXIPIME) 2 g in sodium chloride 0.9 % 100 mL IVPB        2 g 200 mL/hr over 30 Minutes Intravenous Every 12 hours 04/03/22 0825 04/08/22 0959   04/02/22 2000  cefTRIAXone (ROCEPHIN) 1 g in sodium chloride 0.9 % 100 mL IVPB  Status:  Discontinued        1 g 200 mL/hr over 30 Minutes Intravenous Every 24 hours 04/01/22 2048 04/03/22 0825   04/01/22 1915  cefTRIAXone (ROCEPHIN) 1 g in sodium chloride 0.9 % 100 mL IVPB        1 g  200 mL/hr over 30 Minutes Intravenous  Once 04/01/22 1914 04/01/22 2113          Medications  apixaban  5 mg Oral BID   brimonidine  1 drop Both Eyes Q12H   And   timolol  1 drop Both Eyes Q12H   clorazepate  7.5 mg Oral BID   desvenlafaxine  100 mg Oral QHS   diltiazem  240 mg Oral Daily   ferrous sulfate  325 mg Oral Q breakfast   flecainide  50 mg Oral Q12H   hydrALAZINE  100 mg Oral Q8H   latanoprost  1 drop Both Eyes QHS   melatonin  5 mg Oral QHS   valbenazine  40 mg Oral Daily      Subjective:   Tara [name] was seen and examined today.  Denies any specific complaints, awaiting SNF rehab.  Has generalized weakness otherwise no acute issues overnight, no fevers or chills.  Tolerating diet.   Objective:   Vitals:   04/05/22 1933 04/06/22 0344 04/06/22 0728 04/06/22 0842  BP: (!) 164/82 (!) 150/99 (!) 148/87   Pulse: 85 79 77   Resp: 20 18 12     Temp: 98 F (36.7 C) 97.9 F (36.6 C) 97.8 F (36.6 C) 98.1 F (36.7 C)  TempSrc: Oral Oral Oral   SpO2: 100% 100% 100%   Weight:      Height:       No intake or output data in the 24 hours ending 04/06/22 1156    Wt Readings from Last 3 Encounters:  04/03/22 89.1 kg  03/31/22 88.7 kg  03/26/22 88.7 kg   Physical Exam General: Alert and oriented x 3, NAD Cardiovascular: S1 S2 clear, RRR.  Respiratory: CTAB, no wheezing Gastrointestinal: Soft, NT, ND, NBS Ext: no pedal edema bilaterally Neuro: no new deficits Psych: Normal affect and demeanor  Data Reviewed:  I have personally reviewed following labs    CBC Lab Results  Component Value Date   WBC 7.5 04/04/2022   RBC 4.79 04/04/2022   HGB 13.9 04/04/2022   HCT 40.5 04/04/2022   MCV 84.6 04/04/2022   MCH 29.0 04/04/2022   PLT 183 04/04/2022   MCHC 34.3 04/04/2022   RDW 15.2 04/04/2022   LYMPHSABS 1.1 04/01/2022   MONOABS 1.0 04/01/2022   EOSABS 0.0 04/01/2022   BASOSABS 0.1 04/01/2022     Last metabolic panel Lab Results  Component Value Date   NA 138 04/04/2022   K 3.5 04/04/2022   CL 103 04/04/2022   CO2 25 04/04/2022   BUN 20 04/04/2022   CREATININE 1.28 (H) 04/04/2022   GLUCOSE 108 (H) 04/04/2022   GFRNONAA 45 (L) 04/04/2022   GFRAA 48 (L) 01/07/2017   CALCIUM 9.0 04/04/2022   PHOS 4.3 10/06/2020   PROT 5.8 (L) 04/02/2022   ALBUMIN 3.2 (L) 04/02/2022   LABGLOB 2.4 01/07/2017   AGRATIO 1.8 01/07/2017   BILITOT 0.9 04/02/2022   ALKPHOS 78 04/02/2022   AST 36 04/02/2022   ALT 21 04/02/2022   ANIONGAP 10 04/04/2022       Tara [name] M.D. [facility] 04/06/2022, 11:56 AM  Available via Epic secure chat 7am-7pm After 7 pm, please refer to night coverage provider listed on amion.

## 2022-04-06 NOTE — Progress Notes (Signed)
Patient declined CPAP at this time stating she has a headache. Told the patient if she changes her mind to notify the nursing staff and respiratory would come place her on CPAP.

## 2022-04-06 NOTE — Progress Notes (Signed)
Mobility Specialist Progress Note   04/06/22 1540  Mobility  Activity Ambulated with assistance in hallway;Transferred from bed to chair  Level of Assistance Minimal assist, patient does 75% or more  Assistive Device Front wheel walker  Distance Ambulated (ft) 226 ft  Activity Response Tolerated well  $Mobility charge 1 Mobility   Pre Mobility: 84 HR, 100% SpO2 During Mobility: 94 HR, 97% SpO2 Post Mobility: 86 HR, 148/79 BP, 96% SpO2  Received pt in be c/o headache but agreeable to mobility. Required MinA from initial stand from EOB but contact guard for rest of the session. Pt needing constant cues for posture within the RW + Afib present throughout ambulation but pt claimed no symptoms. Able to get back to chair safely w/ call bell in reach and chair alarm on. RN notified  Holland Falling Mobility Specialist MS [facility] #:  [phone] Acute Rehab Office:  [phone]

## 2022-04-06 NOTE — TOC Progression Note (Signed)
Transition of Care [facility]) - Progression Note    Patient Details  Name: Tara [name] MRN: [id number] Date of Birth: [birth year]  Transition of Care [facility]) CM/SW [place], [place] Phone Number: 04/06/2022, 2:18 PM  Clinical Narrative:    CSW followed up with facility several times today, but pt's authorization is still pending. Facility advised that [facility] does not do authorizations over the weekend and that if Josem Kaufmann is not received by 2, pt would have to DC Monday. Pt and medical team notified of delay. Plan to DC Monday.   Expected Discharge Plan: Skilled Nursing Facility Barriers to Discharge: SNF Pending bed offer, Insurance Authorization  Expected Discharge Plan and Services Expected Discharge Plan: [place] Choice: [place] arrangements for the past 2 months: Single Family Home                                       Social Determinants of Health (SDOH) Interventions    Readmission Risk Interventions     No data to display

## 2022-04-07 DIAGNOSIS — N390 Urinary tract infection, site not specified: Secondary | ICD-10-CM | POA: Diagnosis not present

## 2022-04-07 NOTE — Progress Notes (Signed)
PROGRESS NOTE  Tara [name] LTJ:[id number] DOB: 12/17/1950 DOA: 04/01/2022 PCP: Biagio Borg, MD   LOS: 3 days   Brief Narrative / Interim history: 71 year old female with a history of chronic kidney disease, hypertension, obstructive sleep apnea, atrial fibrillation on anticoagulation, admitted to the hospital with generalized weakness and a fall.  Recently seen in the hospital and was evaluated for headache.  She was treated in the emergency room and discharged home in improved condition.  She returned to the hospital after having a fall.  Concern for possible underlying UTI.  Awaiting SNF  Subjective / 24h Interval events: Doing well, no nausea or vomiting, no chest pain, no shortness of breath.  Assesement and Plan: Principal Problem:   Complicated UTI (urinary tract infection) Active Problems:   Depression   Essential hypertension   Acute kidney injury ([name])   Atrial fibrillation ([place])   Fall at home, initial encounter   Principal problem Complicated UTI (urinary tract infection)-Urine culture positive for E. coli with resistance. Currently on IV cefepime, day #5 today, continue for 2 more days to complete 7-day course.    Active Problems: Presyncope and fall -Likely related to hypovolemia and orthostasis. Patient was seen by cardiology, recommended holding Lasix   Persistent atrial fibrillation-Chads2vasc 3, rate controlled. Continue diltiazem, flecainide (flecainide was resumed this admission by cardiology) -Continue Eliquis 5 mg twice daily for anticoagulation   Generalized debility, gait instability -PT recommended SNF for rehab, currently awaiting bed   Acute kidney injury on CKD stage IIIa -Baseline creatinine 0.9, on admit 1.29  Depression-Continue Ingrezza   Essential hypertension-BP elevated, continue Cardizem  Obesity, class I -Estimated body mass index is 31.7.  She would benefit from weight loss  Scheduled Meds:  apixaban  5 mg Oral BID   brimonidine  1  drop Both Eyes Q12H   And   timolol  1 drop Both Eyes Q12H   clorazepate  7.5 mg Oral BID   desvenlafaxine  100 mg Oral QHS   diltiazem  240 mg Oral Daily   ferrous sulfate  325 mg Oral Q breakfast   flecainide  50 mg Oral Q12H   hydrALAZINE  100 mg Oral Q8H   latanoprost  1 drop Both Eyes QHS   melatonin  5 mg Oral QHS   valbenazine  40 mg Oral Daily   Continuous Infusions:  ceFEPime (MAXIPIME) IV 2 g (04/07/22 0946)   PRN Meds:.acetaminophen **OR** acetaminophen, albuterol, mouth rinse, polyvinyl alcohol  Current Outpatient Medications  Medication Instructions   amoxicillin (AMOXIL) 2,000 mg, Oral,  Once, Prior to dental appointments   antiseptic oral rinse (BIOTENE) LIQD 15 mLs, Mouth Rinse, 3 times daily PRN   apixaban (ELIQUIS) 5 mg, Oral, 2 times daily   baclofen (LIORESAL) 5 mg, Oral, 2 times daily PRN   brimonidine-timolol (COMBIGAN) 0.2-0.5 % ophthalmic solution 1 drop, Both Eyes, Every 12 hours   carboxymethylcellulose (REFRESH PLUS) 0.5 % SOLN 1 drop, Both Eyes, 3 times daily PRN, RETAIN eye drops instead of refresh   cholecalciferol (VITAMIN D3) 1,000 Units, Oral, Daily   clorazepate (TRANXENE) 7.5 mg, Oral, 2 times daily   desvenlafaxine (PRISTIQ) 100 mg, Oral, Daily at bedtime   diltiazem (CARDIZEM CD) 240 mg, Oral, Daily   ferrous sulfate 325 mg, Oral, Daily with breakfast   hydrALAZINE (APRESOLINE) 100 mg, Oral, 2 times daily   Ingrezza 40 mg, Oral, Daily   latanoprost (XALATAN) 0.005 % ophthalmic solution 1 drop, Both Eyes, Daily at bedtime   melatonin 10  mg, Oral, Daily at bedtime   Multiple Vitamins-Minerals (MULTIVITAMIN WITH MINERALS) tablet 1 tablet, Oral, Daily   Omega 3 2,400 mg, Oral, Daily at bedtime   zaleplon (SONATA) 20 mg, Oral, Daily at bedtime    Diet Orders (From admission, onward)     Start     Ordered   04/01/22 2048  Diet Heart Room service appropriate? Yes; Fluid consistency: Thin  Diet effective now       Question Answer Comment  Room  service appropriate? Yes   Fluid consistency: Thin      04/01/22 2048            DVT prophylaxis:  apixaban (ELIQUIS) tablet 5 mg   Lab Results  Component Value Date   PLT 183 04/04/2022      Code Status: Full Code  Family Communication: No family at bedside  Status is: Inpatient Remains inpatient appropriate because: Awaiting SNF   Level of care: Telemetry Medical  Consultants:  cards  Objective: Vitals:   04/06/22 1738 04/06/22 2208 04/07/22 0436 04/07/22 0831  BP: (!) 176/93 (!) 146/88 (!) 151/89 (!) 155/84  Pulse: 85  68 89  Resp:   13 20  Temp:    98.3 F (36.8 C)  TempSrc:    Oral  SpO2:   97% 94%  Weight:      Height:        Intake/Output Summary (Last 24 hours) at 04/07/2022 1041 Last data filed at 04/07/2022 0840 Gross per 24 hour  Intake 240 ml  Output 1000 ml  Net -760 ml   Wt Readings from Last 3 Encounters:  04/03/22 89.1 kg  03/31/22 88.7 kg  03/26/22 88.7 kg    Examination:  Constitutional: NAD Eyes: no scleral icterus ENMT: Mucous membranes are moist.  Neck: normal, supple Respiratory: clear to auscultation bilaterally, no wheezing, no crackles. Normal respiratory effort. No accessory muscle use.  Cardiovascular: Regular rate and rhythm, no murmurs / rubs / gallops.  Abdomen: non distended, no tenderness. Bowel sounds positive.  Musculoskeletal: no clubbing / cyanosis.  Skin: no rashes   Data Reviewed: I have independently reviewed following labs and imaging studies   CBC Recent Labs  Lab 04/01/22 1651 04/02/22 0312 04/03/22 0109 04/04/22 0158  WBC 10.9* 9.9 8.2 7.5  HGB 14.7 14.2 12.8 13.9  HCT 43.8 43.2 38.9 40.5  PLT 204 195 183 183  MCV 85.7 87.8 86.3 84.6  MCH 28.8 28.9 28.4 29.0  MCHC 33.6 32.9 32.9 34.3  RDW 15.0 15.3 15.5 15.2  LYMPHSABS 1.1  --   --   --   MONOABS 1.0  --   --   --   EOSABS 0.0  --   --   --   BASOSABS 0.1  --   --   --     Recent Labs  Lab 04/01/22 1651 04/01/22 2042  04/01/22 2112 04/02/22 0207 04/02/22 0311 04/03/22 0109 04/04/22 0158  NA 141 145  --   --  142 138 138  K 3.4* 3.3*  --   --  3.8 3.5 3.5  CL 110 110  --   --  110 108 103  CO2 21* 26  --   --  19* 25 25  GLUCOSE 125* 88  --   --  91 107* 108*  BUN 18 14  --   --  '14 16 20  '$ CREATININE 1.29* 1.14*  --   --  1.05* 1.14* 1.28*  CALCIUM 9.3 8.9  --   --  8.9 8.8* 9.0  AST 27  --   --   --  36  --   --   ALT 21  --   --   --  21  --   --   ALKPHOS 83  --   --   --  78  --   --   BILITOT 0.8  --   --   --  0.9  --   --   ALBUMIN 3.5  --   --   --  3.2*  --   --   MG  --  1.9  --   --   --   --   --   LATICACIDVEN  --   --  1.1 1.1  --   --   --   TSH  --  1.389  --   --   --   --   --   HGBA1C  --  5.6  --   --   --   --   --     ------------------------------------------------------------------------------------------------------------------ No results for input(s): "CHOL", "HDL", "LDLCALC", "TRIG", "CHOLHDL", "LDLDIRECT" in the last 72 hours.  Lab Results  Component Value Date   HGBA1C 5.6 04/01/2022   ------------------------------------------------------------------------------------------------------------------ No results for input(s): "TSH", "T4TOTAL", "T3FREE", "THYROIDAB" in the last 72 hours.  Invalid input(s): "FREET3"  Cardiac Enzymes No results for input(s): "CKMB", "TROPONINI", "MYOGLOBIN" in the last 168 hours.  Invalid input(s): "CK" ------------------------------------------------------------------------------------------------------------------ No results found for: "BNP"  CBG: No results for input(s): "GLUCAP" in the last 168 hours.  Recent Results (from the past 240 hour(s))  Urine Culture     Status: Abnormal   Collection Time: 04/01/22  4:54 PM   Specimen: Urine, Clean Catch  Result Value Ref Range Status   Specimen Description URINE, CLEAN CATCH  Final   Special Requests   Final    NONE Performed at [facility], [address]  [place], [place] [address]    Culture >=100,000 COLONIES/mL ESCHERICHIA COLI (A)  Final   Report Status 04/03/2022 FINAL  Final   Organism ID, Bacteria ESCHERICHIA COLI (A)  Final      Susceptibility   Escherichia coli - MIC*    AMPICILLIN >=32 RESISTANT Resistant     CEFAZOLIN >=64 RESISTANT Resistant     CEFEPIME <=0.12 SENSITIVE Sensitive     CEFTRIAXONE 4 RESISTANT Resistant     CIPROFLOXACIN >=4 RESISTANT Resistant     GENTAMICIN <=1 SENSITIVE Sensitive     IMIPENEM <=0.25 SENSITIVE Sensitive     NITROFURANTOIN 64 INTERMEDIATE Intermediate     TRIMETH/SULFA <=20 SENSITIVE Sensitive     AMPICILLIN/SULBACTAM 16 INTERMEDIATE Intermediate     PIP/TAZO <=4 SENSITIVE Sensitive     * >=100,000 COLONIES/mL ESCHERICHIA COLI  SARS Coronavirus 2 by RT PCR (hospital order, performed in [facility] hospital lab) *cepheid single result test* Anterior Nasal Swab     Status: None   Collection Time: 04/02/22  1:53 AM   Specimen: Anterior Nasal Swab  Result Value Ref Range Status   SARS Coronavirus 2 by RT PCR NEGATIVE NEGATIVE Final    Comment: (NOTE) SARS-CoV-2 target nucleic acids are NOT DETECTED.  The SARS-CoV-2 RNA is generally detectable in upper and lower respiratory specimens during the acute phase of infection. The lowest concentration of SARS-CoV-2 viral copies this assay can detect is 250 copies / mL. A negative result does not preclude SARS-CoV-2 infection and should not be used as the sole basis for  treatment or other patient management decisions.  A negative result may occur with improper specimen collection / handling, submission of specimen other than nasopharyngeal swab, presence of viral mutation(s) within the areas targeted by this assay, and inadequate number of viral copies (<250 copies / mL). A negative result must be combined with clinical observations, patient history, and epidemiological information.  Fact Sheet for Patients:    https://www.patel.info/  Fact Sheet for Healthcare Providers: https://hall.com/  This test is not yet approved or  cleared by the [place] FDA and has been authorized for detection and/or diagnosis of SARS-CoV-2 by FDA under an Emergency Use Authorization (EUA).  This EUA will remain in effect (meaning this test can be used) for the duration of the COVID-19 declaration under Section 564(b)(1) of the Act, 21 U.S.C. section 360bbb-3(b)(1), unless the authorization is terminated or revoked sooner.  Performed at [facility], [place] [address], [place], [place] [address]      Radiology Studies: No results found.   Marzetta Board, MD, PhD [facility]  Between 7 am - 7 pm I am available, please contact me via Amion (for emergencies) or Securechat (non urgent messages)  Between 7 pm - 7 am I am not available, please contact night coverage MD/APP via Amion

## 2022-04-07 NOTE — Progress Notes (Signed)
Pt does not want to wear CPAP 

## 2022-04-08 DIAGNOSIS — N390 Urinary tract infection, site not specified: Secondary | ICD-10-CM | POA: Diagnosis not present

## 2022-04-08 LAB — CBC
HCT: 42.9 % (ref 36.0–46.0)
Hemoglobin: 14.8 g/dL (ref 12.0–15.0)
MCH: 29 pg (ref 26.0–34.0)
MCHC: 34.5 g/dL (ref 30.0–36.0)
MCV: 84.1 fL (ref 80.0–100.0)
Platelets: 202 10*3/uL (ref 150–400)
RBC: 5.1 MIL/uL (ref 3.87–5.11)
RDW: 14.8 % (ref 11.5–15.5)
WBC: 7.7 10*3/uL (ref 4.0–10.5)
nRBC: 0 % (ref 0.0–0.2)

## 2022-04-08 LAB — BASIC METABOLIC PANEL
Anion gap: 9 (ref 5–15)
BUN: 23 mg/dL (ref 8–23)
CO2: 25 mmol/L (ref 22–32)
Calcium: 9.7 mg/dL (ref 8.9–10.3)
Chloride: 105 mmol/L (ref 98–111)
Creatinine, Ser: 0.99 mg/dL (ref 0.44–1.00)
GFR, Estimated: >60 mL/min (ref >60)
Glucose, Bld: 103 mg/dL — ABNORMAL HIGH (ref 70–99)
Potassium: 3.2 mmol/L — ABNORMAL LOW (ref 3.5–5.1)
Sodium: 139 mmol/L (ref 135–145)

## 2022-04-08 LAB — MAGNESIUM: Magnesium: 2.1 mg/dL (ref 1.7–2.4)

## 2022-04-08 MED ORDER — POTASSIUM CHLORIDE CRYS ER 20 MEQ PO TBCR
40.0000 meq | EXTENDED_RELEASE_TABLET | Freq: Once | ORAL | Status: AC
  Administered 2022-04-08: 40 meq via ORAL
  Filled 2022-04-08: qty 2

## 2022-04-08 NOTE — Progress Notes (Signed)
PROGRESS NOTE  Tara [name] BWI:[id number] DOB: 1951-05-20 DOA: 04/01/2022 PCP: Biagio Borg, MD   LOS: 4 days   Brief Narrative / Interim history: 71 year old female with a history of chronic kidney disease, hypertension, obstructive sleep apnea, atrial fibrillation on anticoagulation, admitted to the hospital with generalized weakness and a fall.  Recently seen in the hospital and was evaluated for headache.  She was treated in the emergency room and discharged home in improved condition.  She returned to the hospital after having a fall.  Concern for possible underlying UTI.  Awaiting SNF  Subjective / 24h Interval events: States that yesterday, intermittently, her face "has been giving her problem".  She cannot fully explain her symptoms   Assesement and Plan: Principal Problem:   Complicated UTI (urinary tract infection) Active Problems:   Depression   Essential hypertension   Acute kidney injury ([place])   Atrial fibrillation ([facility])   Fall at home, initial encounter   Principal problem Complicated UTI (urinary tract infection)-Urine culture positive for E. coli with resistance. Currently on IV cefepime, day #6 today, continue for 1 more days to complete 7-day course.    Active Problems: Presyncope and fall -Likely related to hypovolemia and orthostasis. Patient was seen by cardiology, recommended holding Lasix.  MRI of the brain on admission was negative for acute findings   Persistent atrial fibrillation-Chads2vasc 3, rate controlled. Continue diltiazem, flecainide (flecainide was resumed this admission by cardiology) -Continue Eliquis 5 mg twice daily for anticoagulation.  No bleeding   Generalized debility, gait instability -PT recommended SNF for rehab, currently awaiting insurance authorization   Acute kidney injury on CKD stage IIIa -Baseline creatinine 0.9, on admit 1.29.  Creatinine normal this morning at 0.9  Hypokalemia-replete potassium and continue to  monitor  Depression-Continue Ingrezza   Essential hypertension-continue Cardizem, hydralazine.  Blood pressure slightly on the higher side  Obesity, class I -Estimated body mass index is 31.7.  She would benefit from weight loss  Scheduled Meds:  apixaban  5 mg Oral BID   brimonidine  1 drop Both Eyes Q12H   And   timolol  1 drop Both Eyes Q12H   clorazepate  7.5 mg Oral BID   desvenlafaxine  100 mg Oral QHS   diltiazem  240 mg Oral Daily   ferrous sulfate  325 mg Oral Q breakfast   flecainide  50 mg Oral Q12H   hydrALAZINE  100 mg Oral Q8H   latanoprost  1 drop Both Eyes QHS   melatonin  5 mg Oral QHS   valbenazine  40 mg Oral Daily   Continuous Infusions:   PRN Meds:.acetaminophen **OR** acetaminophen, albuterol, mouth rinse, polyvinyl alcohol  Current Outpatient Medications  Medication Instructions   amoxicillin (AMOXIL) 2,000 mg, Oral,  Once, Prior to dental appointments   antiseptic oral rinse (BIOTENE) LIQD 15 mLs, Mouth Rinse, 3 times daily PRN   apixaban (ELIQUIS) 5 mg, Oral, 2 times daily   baclofen (LIORESAL) 5 mg, Oral, 2 times daily PRN   brimonidine-timolol (COMBIGAN) 0.2-0.5 % ophthalmic solution 1 drop, Both Eyes, Every 12 hours   carboxymethylcellulose (REFRESH PLUS) 0.5 % SOLN 1 drop, Both Eyes, 3 times daily PRN, RETAIN eye drops instead of refresh   cholecalciferol (VITAMIN D3) 1,000 Units, Oral, Daily   clorazepate (TRANXENE) 7.5 mg, Oral, 2 times daily   desvenlafaxine (PRISTIQ) 100 mg, Oral, Daily at bedtime   diltiazem (CARDIZEM CD) 240 mg, Oral, Daily   ferrous sulfate 325 mg, Oral, Daily with breakfast  hydrALAZINE (APRESOLINE) 100 mg, Oral, 2 times daily   Ingrezza 40 mg, Oral, Daily   latanoprost (XALATAN) 0.005 % ophthalmic solution 1 drop, Both Eyes, Daily at bedtime   melatonin 10 mg, Oral, Daily at bedtime   Multiple Vitamins-Minerals (MULTIVITAMIN WITH MINERALS) tablet 1 tablet, Oral, Daily   Omega 3 2,400 mg, Oral, Daily at bedtime    zaleplon (SONATA) 20 mg, Oral, Daily at bedtime    Diet Orders (From admission, onward)     Start     Ordered   04/01/22 2048  Diet Heart Room service appropriate? Yes; Fluid consistency: Thin  Diet effective now       Question Answer Comment  Room service appropriate? Yes   Fluid consistency: Thin      04/01/22 2048            DVT prophylaxis:  apixaban (ELIQUIS) tablet 5 mg   Lab Results  Component Value Date   PLT 202 04/08/2022      Code Status: Full Code  Family Communication: No family at bedside  Status is: Inpatient Remains inpatient appropriate because: Awaiting SNF   Level of care: Telemetry Medical  Consultants:  cards  Objective: Vitals:   04/07/22 1258 04/07/22 1938 04/08/22 0355 04/08/22 0852  BP: (!) 152/110 (!) 157/88 (!) 155/83 (!) 148/86  Pulse:  75 65 (!) 101  Resp:  '15 14 16  '$ Temp:  98.9 F (37.2 C) 98.9 F (37.2 C)   TempSrc:  Oral Oral Oral  SpO2:  97% 97% 98%  Weight:      Height:        Intake/Output Summary (Last 24 hours) at 04/08/2022 1030 Last data filed at 04/08/2022 0100 Gross per 24 hour  Intake 1003.33 ml  Output --  Net 1003.33 ml    Wt Readings from Last 3 Encounters:  04/03/22 89.1 kg  03/31/22 88.7 kg  03/26/22 88.7 kg    Examination:  Constitutional: NAD Eyes: lids and conjunctivae normal, no scleral icterus ENMT: mmm Neck: normal, supple Respiratory: clear to auscultation bilaterally, no wheezing, no crackles. Normal respiratory effort.  Cardiovascular: Regular rate and rhythm, no murmurs / rubs / gallops. No LE edema. Abdomen: soft, no distention, no tenderness. Bowel sounds positive.  Skin: no rashes Neurologic: no focal deficits, equal strength   Data Reviewed: I have independently reviewed following labs and imaging studies   CBC Recent Labs  Lab 04/01/22 1651 04/02/22 0312 04/03/22 0109 04/04/22 0158 04/08/22 0206  WBC 10.9* 9.9 8.2 7.5 7.7  HGB 14.7 14.2 12.8 13.9 14.8  HCT 43.8  43.2 38.9 40.5 42.9  PLT 204 195 183 183 202  MCV 85.7 87.8 86.3 84.6 84.1  MCH 28.8 28.9 28.4 29.0 29.0  MCHC 33.6 32.9 32.9 34.3 34.5  RDW 15.0 15.3 15.5 15.2 14.8  LYMPHSABS 1.1  --   --   --   --   MONOABS 1.0  --   --   --   --   EOSABS 0.0  --   --   --   --   BASOSABS 0.1  --   --   --   --      Recent Labs  Lab 04/01/22 1651 04/01/22 2042 04/01/22 2112 04/02/22 0207 04/02/22 0311 04/03/22 0109 04/04/22 0158 04/08/22 0206  NA 141 145  --   --  142 138 138 139  K 3.4* 3.3*  --   --  3.8 3.5 3.5 3.2*  CL 110 110  --   --  110 108 103 105  CO2 21* 26  --   --  19* '25 25 25  '$ GLUCOSE 125* 88  --   --  91 107* 108* 103*  BUN 18 14  --   --  '14 16 20 23  '$ CREATININE 1.29* 1.14*  --   --  1.05* 1.14* 1.28* 0.99  CALCIUM 9.3 8.9  --   --  8.9 8.8* 9.0 9.7  AST 27  --   --   --  36  --   --   --   ALT 21  --   --   --  21  --   --   --   ALKPHOS 83  --   --   --  78  --   --   --   BILITOT 0.8  --   --   --  0.9  --   --   --   ALBUMIN 3.5  --   --   --  3.2*  --   --   --   MG  --  1.9  --   --   --   --   --  2.1  LATICACIDVEN  --   --  1.1 1.1  --   --   --   --   TSH  --  1.389  --   --   --   --   --   --   HGBA1C  --  5.6  --   --   --   --   --   --      ------------------------------------------------------------------------------------------------------------------ No results for input(s): "CHOL", "HDL", "LDLCALC", "TRIG", "CHOLHDL", "LDLDIRECT" in the last 72 hours.  Lab Results  Component Value Date   HGBA1C 5.6 04/01/2022   ------------------------------------------------------------------------------------------------------------------ No results for input(s): "TSH", "T4TOTAL", "T3FREE", "THYROIDAB" in the last 72 hours.  Invalid input(s): "FREET3"  Cardiac Enzymes No results for input(s): "CKMB", "TROPONINI", "MYOGLOBIN" in the last 168 hours.  Invalid input(s):  "CK" ------------------------------------------------------------------------------------------------------------------ No results found for: "BNP"  CBG: No results for input(s): "GLUCAP" in the last 168 hours.  Recent Results (from the past 240 hour(s))  Urine Culture     Status: Abnormal   Collection Time: 04/01/22  4:54 PM   Specimen: Urine, Clean Catch  Result Value Ref Range Status   Specimen Description URINE, CLEAN CATCH  Final   Special Requests   Final    NONE Performed at [facility], [address] [place], [place] [address]    Culture >=100,000 COLONIES/mL ESCHERICHIA COLI (A)  Final   Report Status 04/03/2022 FINAL  Final   Organism ID, Bacteria ESCHERICHIA COLI (A)  Final      Susceptibility   Escherichia coli - MIC*    AMPICILLIN >=32 RESISTANT Resistant     CEFAZOLIN >=64 RESISTANT Resistant     CEFEPIME <=0.12 SENSITIVE Sensitive     CEFTRIAXONE 4 RESISTANT Resistant     CIPROFLOXACIN >=4 RESISTANT Resistant     GENTAMICIN <=1 SENSITIVE Sensitive     IMIPENEM <=0.25 SENSITIVE Sensitive     NITROFURANTOIN 64 INTERMEDIATE Intermediate     TRIMETH/SULFA <=20 SENSITIVE Sensitive     AMPICILLIN/SULBACTAM 16 INTERMEDIATE Intermediate     PIP/TAZO <=4 SENSITIVE Sensitive     * >=100,000 COLONIES/mL ESCHERICHIA COLI  SARS Coronavirus 2 by RT PCR (hospital order, performed in [facility] hospital lab) *cepheid single result test* Anterior Nasal Swab     Status:  None   Collection Time: 04/02/22  1:53 AM   Specimen: Anterior Nasal Swab  Result Value Ref Range Status   SARS Coronavirus 2 by RT PCR NEGATIVE NEGATIVE Final    Comment: (NOTE) SARS-CoV-2 target nucleic acids are NOT DETECTED.  The SARS-CoV-2 RNA is generally detectable in upper and lower respiratory specimens during the acute phase of infection. The lowest concentration of SARS-CoV-2 viral copies this assay can detect is 250 copies / mL. A negative result does not preclude SARS-CoV-2  infection and should not be used as the sole basis for treatment or other patient management decisions.  A negative result may occur with improper specimen collection / handling, submission of specimen other than nasopharyngeal swab, presence of viral mutation(s) within the areas targeted by this assay, and inadequate number of viral copies (<250 copies / mL). A negative result must be combined with clinical observations, patient history, and epidemiological information.  Fact Sheet for Patients:   https://www.patel.info/  Fact Sheet for Healthcare Providers: https://hall.com/  This test is not yet approved or  cleared by the [place] FDA and has been authorized for detection and/or diagnosis of SARS-CoV-2 by FDA under an Emergency Use Authorization (EUA).  This EUA will remain in effect (meaning this test can be used) for the duration of the COVID-19 declaration under Section 564(b)(1) of the Act, 21 U.S.C. section 360bbb-3(b)(1), unless the authorization is terminated or revoked sooner.  Performed at [facility], [place] [address] [place], [place] [address]      Radiology Studies: No results found.   Marzetta Board, MD, PhD [facility]  Between 7 am - 7 pm I am available, please contact me via Amion (for emergencies) or Securechat (non urgent messages)  Between 7 pm - 7 am I am not available, please contact night coverage MD/APP via Amion

## 2022-04-09 DIAGNOSIS — M1712 Unilateral primary osteoarthritis, left knee: Secondary | ICD-10-CM | POA: Diagnosis not present

## 2022-04-09 DIAGNOSIS — E872 Acidosis, unspecified: Secondary | ICD-10-CM | POA: Diagnosis not present

## 2022-04-09 DIAGNOSIS — R1310 Dysphagia, unspecified: Secondary | ICD-10-CM | POA: Diagnosis not present

## 2022-04-09 DIAGNOSIS — I480 Paroxysmal atrial fibrillation: Secondary | ICD-10-CM | POA: Diagnosis not present

## 2022-04-09 DIAGNOSIS — R0603 Acute respiratory distress: Secondary | ICD-10-CM | POA: Diagnosis present

## 2022-04-09 DIAGNOSIS — F330 Major depressive disorder, recurrent, mild: Secondary | ICD-10-CM | POA: Diagnosis not present

## 2022-04-09 DIAGNOSIS — F5105 Insomnia due to other mental disorder: Secondary | ICD-10-CM | POA: Diagnosis not present

## 2022-04-09 DIAGNOSIS — I2510 Atherosclerotic heart disease of native coronary artery without angina pectoris: Secondary | ICD-10-CM | POA: Diagnosis present

## 2022-04-09 DIAGNOSIS — G4733 Obstructive sleep apnea (adult) (pediatric): Secondary | ICD-10-CM | POA: Diagnosis not present

## 2022-04-09 DIAGNOSIS — R0689 Other abnormalities of breathing: Secondary | ICD-10-CM | POA: Diagnosis not present

## 2022-04-09 DIAGNOSIS — R778 Other specified abnormalities of plasma proteins: Secondary | ICD-10-CM | POA: Diagnosis not present

## 2022-04-09 DIAGNOSIS — B351 Tinea unguium: Secondary | ICD-10-CM | POA: Diagnosis not present

## 2022-04-09 DIAGNOSIS — M6281 Muscle weakness (generalized): Secondary | ICD-10-CM | POA: Diagnosis not present

## 2022-04-09 DIAGNOSIS — I2489 Other forms of acute ischemic heart disease: Secondary | ICD-10-CM | POA: Diagnosis not present

## 2022-04-09 DIAGNOSIS — N390 Urinary tract infection, site not specified: Secondary | ICD-10-CM | POA: Diagnosis not present

## 2022-04-09 DIAGNOSIS — N184 Chronic kidney disease, stage 4 (severe): Secondary | ICD-10-CM | POA: Diagnosis not present

## 2022-04-09 DIAGNOSIS — Z7401 Bed confinement status: Secondary | ICD-10-CM | POA: Diagnosis not present

## 2022-04-09 DIAGNOSIS — R42 Dizziness and giddiness: Secondary | ICD-10-CM | POA: Diagnosis not present

## 2022-04-09 DIAGNOSIS — F320 Major depressive disorder, single episode, mild: Secondary | ICD-10-CM | POA: Diagnosis not present

## 2022-04-09 DIAGNOSIS — Z66 Do not resuscitate: Secondary | ICD-10-CM | POA: Diagnosis not present

## 2022-04-09 DIAGNOSIS — Z515 Encounter for palliative care: Secondary | ICD-10-CM | POA: Diagnosis not present

## 2022-04-09 DIAGNOSIS — Z7901 Long term (current) use of anticoagulants: Secondary | ICD-10-CM | POA: Diagnosis not present

## 2022-04-09 DIAGNOSIS — R7989 Other specified abnormal findings of blood chemistry: Secondary | ICD-10-CM | POA: Diagnosis not present

## 2022-04-09 DIAGNOSIS — J189 Pneumonia, unspecified organism: Secondary | ICD-10-CM | POA: Diagnosis not present

## 2022-04-09 DIAGNOSIS — R1314 Dysphagia, pharyngoesophageal phase: Secondary | ICD-10-CM | POA: Diagnosis not present

## 2022-04-09 DIAGNOSIS — R4689 Other symptoms and signs involving appearance and behavior: Secondary | ICD-10-CM | POA: Diagnosis not present

## 2022-04-09 DIAGNOSIS — G2401 Drug induced subacute dyskinesia: Secondary | ICD-10-CM | POA: Diagnosis not present

## 2022-04-09 DIAGNOSIS — J690 Pneumonitis due to inhalation of food and vomit: Secondary | ICD-10-CM | POA: Diagnosis not present

## 2022-04-09 DIAGNOSIS — R519 Headache, unspecified: Secondary | ICD-10-CM | POA: Diagnosis not present

## 2022-04-09 DIAGNOSIS — R000 Tachycardia, unspecified: Secondary | ICD-10-CM | POA: Diagnosis not present

## 2022-04-09 DIAGNOSIS — A419 Sepsis, unspecified organism: Secondary | ICD-10-CM | POA: Diagnosis not present

## 2022-04-09 DIAGNOSIS — F32A Depression, unspecified: Secondary | ICD-10-CM | POA: Diagnosis not present

## 2022-04-09 DIAGNOSIS — I4891 Unspecified atrial fibrillation: Secondary | ICD-10-CM | POA: Diagnosis not present

## 2022-04-09 DIAGNOSIS — M2550 Pain in unspecified joint: Secondary | ICD-10-CM | POA: Diagnosis not present

## 2022-04-09 DIAGNOSIS — Z9181 History of falling: Secondary | ICD-10-CM | POA: Diagnosis not present

## 2022-04-09 DIAGNOSIS — Z743 Need for continuous supervision: Secondary | ICD-10-CM | POA: Diagnosis not present

## 2022-04-09 DIAGNOSIS — K7200 Acute and subacute hepatic failure without coma: Secondary | ICD-10-CM | POA: Diagnosis not present

## 2022-04-09 DIAGNOSIS — R1312 Dysphagia, oropharyngeal phase: Secondary | ICD-10-CM | POA: Diagnosis not present

## 2022-04-09 DIAGNOSIS — F4320 Adjustment disorder, unspecified: Secondary | ICD-10-CM | POA: Diagnosis not present

## 2022-04-09 DIAGNOSIS — W19XXXA Unspecified fall, initial encounter: Secondary | ICD-10-CM | POA: Diagnosis not present

## 2022-04-09 DIAGNOSIS — M7671 Peroneal tendinitis, right leg: Secondary | ICD-10-CM | POA: Diagnosis not present

## 2022-04-09 DIAGNOSIS — E860 Dehydration: Secondary | ICD-10-CM | POA: Diagnosis present

## 2022-04-09 DIAGNOSIS — I4819 Other persistent atrial fibrillation: Secondary | ICD-10-CM | POA: Diagnosis not present

## 2022-04-09 DIAGNOSIS — G4489 Other headache syndrome: Secondary | ICD-10-CM | POA: Diagnosis not present

## 2022-04-09 DIAGNOSIS — N189 Chronic kidney disease, unspecified: Secondary | ICD-10-CM | POA: Diagnosis not present

## 2022-04-09 DIAGNOSIS — R627 Adult failure to thrive: Secondary | ICD-10-CM | POA: Diagnosis present

## 2022-04-09 DIAGNOSIS — R531 Weakness: Secondary | ICD-10-CM | POA: Diagnosis not present

## 2022-04-09 DIAGNOSIS — R6520 Severe sepsis without septic shock: Secondary | ICD-10-CM | POA: Diagnosis not present

## 2022-04-09 DIAGNOSIS — I129 Hypertensive chronic kidney disease with stage 1 through stage 4 chronic kidney disease, or unspecified chronic kidney disease: Secondary | ICD-10-CM | POA: Diagnosis not present

## 2022-04-09 DIAGNOSIS — G527 Disorders of multiple cranial nerves: Secondary | ICD-10-CM | POA: Diagnosis not present

## 2022-04-09 DIAGNOSIS — Z79899 Other long term (current) drug therapy: Secondary | ICD-10-CM | POA: Diagnosis not present

## 2022-04-09 DIAGNOSIS — I10 Essential (primary) hypertension: Secondary | ICD-10-CM | POA: Diagnosis not present

## 2022-04-09 DIAGNOSIS — D509 Iron deficiency anemia, unspecified: Secondary | ICD-10-CM | POA: Diagnosis not present

## 2022-04-09 DIAGNOSIS — N179 Acute kidney failure, unspecified: Secondary | ICD-10-CM | POA: Diagnosis not present

## 2022-04-09 DIAGNOSIS — F411 Generalized anxiety disorder: Secondary | ICD-10-CM | POA: Diagnosis not present

## 2022-04-09 DIAGNOSIS — N1831 Chronic kidney disease, stage 3a: Secondary | ICD-10-CM | POA: Diagnosis not present

## 2022-04-09 DIAGNOSIS — E1169 Type 2 diabetes mellitus with other specified complication: Secondary | ICD-10-CM | POA: Diagnosis not present

## 2022-04-09 DIAGNOSIS — D869 Sarcoidosis, unspecified: Secondary | ICD-10-CM | POA: Diagnosis not present

## 2022-04-09 DIAGNOSIS — T68XXXA Hypothermia, initial encounter: Secondary | ICD-10-CM | POA: Diagnosis not present

## 2022-04-09 DIAGNOSIS — B370 Candidal stomatitis: Secondary | ICD-10-CM | POA: Diagnosis not present

## 2022-04-09 DIAGNOSIS — E876 Hypokalemia: Secondary | ICD-10-CM | POA: Diagnosis present

## 2022-04-09 DIAGNOSIS — R4189 Other symptoms and signs involving cognitive functions and awareness: Secondary | ICD-10-CM | POA: Diagnosis not present

## 2022-04-09 DIAGNOSIS — R404 Transient alteration of awareness: Secondary | ICD-10-CM | POA: Diagnosis not present

## 2022-04-09 DIAGNOSIS — Z20822 Contact with and (suspected) exposure to covid-19: Secondary | ICD-10-CM | POA: Diagnosis not present

## 2022-04-09 DIAGNOSIS — E1151 Type 2 diabetes mellitus with diabetic peripheral angiopathy without gangrene: Secondary | ICD-10-CM | POA: Diagnosis not present

## 2022-04-09 DIAGNOSIS — R1311 Dysphagia, oral phase: Secondary | ICD-10-CM | POA: Diagnosis not present

## 2022-04-09 DIAGNOSIS — R64 Cachexia: Secondary | ICD-10-CM | POA: Diagnosis not present

## 2022-04-09 DIAGNOSIS — E870 Hyperosmolality and hypernatremia: Secondary | ICD-10-CM | POA: Diagnosis not present

## 2022-04-09 DIAGNOSIS — R410 Disorientation, unspecified: Secondary | ICD-10-CM | POA: Diagnosis not present

## 2022-04-09 DIAGNOSIS — N3001 Acute cystitis with hematuria: Secondary | ICD-10-CM | POA: Diagnosis not present

## 2022-04-09 DIAGNOSIS — R2681 Unsteadiness on feet: Secondary | ICD-10-CM | POA: Diagnosis not present

## 2022-04-09 DIAGNOSIS — D6869 Other thrombophilia: Secondary | ICD-10-CM | POA: Diagnosis not present

## 2022-04-09 DIAGNOSIS — G9341 Metabolic encephalopathy: Secondary | ICD-10-CM | POA: Diagnosis not present

## 2022-04-09 DIAGNOSIS — J9601 Acute respiratory failure with hypoxia: Secondary | ICD-10-CM | POA: Diagnosis not present

## 2022-04-09 DIAGNOSIS — E785 Hyperlipidemia, unspecified: Secondary | ICD-10-CM | POA: Diagnosis not present

## 2022-04-09 DIAGNOSIS — D8689 Sarcoidosis of other sites: Secondary | ICD-10-CM | POA: Diagnosis not present

## 2022-04-09 LAB — BASIC METABOLIC PANEL
Anion gap: 7 (ref 5–15)
BUN: 23 mg/dL (ref 8–23)
CO2: 25 mmol/L (ref 22–32)
Calcium: 9.9 mg/dL (ref 8.9–10.3)
Chloride: 105 mmol/L (ref 98–111)
Creatinine, Ser: 1.06 mg/dL — ABNORMAL HIGH (ref 0.44–1.00)
GFR, Estimated: 57 mL/min — ABNORMAL LOW (ref >60)
Glucose, Bld: 107 mg/dL — ABNORMAL HIGH (ref 70–99)
Potassium: 3.8 mmol/L (ref 3.5–5.1)
Sodium: 137 mmol/L (ref 135–145)

## 2022-04-09 MED ORDER — FLECAINIDE ACETATE 50 MG PO TABS: 50.0000 mg | ORAL_TABLET | Freq: Two times a day (BID) | ORAL | Status: DC

## 2022-04-09 MED ORDER — ISOSORBIDE MONONITRATE ER 30 MG PO TB24
30.0000 mg | ORAL_TABLET | Freq: Every day | ORAL | Status: DC
  Administered 2022-04-09: 30 mg via ORAL
  Filled 2022-04-09: qty 1

## 2022-04-09 MED ORDER — ISOSORBIDE MONONITRATE ER 30 MG PO TB24: 30.0000 mg | ORAL_TABLET | Freq: Every day | ORAL | Status: DC

## 2022-04-09 MED ORDER — HYDRALAZINE HCL 100 MG PO TABS: 100.0000 mg | ORAL_TABLET | Freq: Three times a day (TID) | ORAL | Status: DC

## 2022-04-09 MED ORDER — LABETALOL HCL 5 MG/ML IV SOLN: 20.0000 mg | Freq: Once | INTRAVENOUS | Status: DC

## 2022-04-09 NOTE — Plan of Care (Signed)

## 2022-04-09 NOTE — TOC Progression Note (Signed)
Transition of Care [facility]) - Progression Note    Patient Details  Name: Tara [name] MRN: [id number] Date of Birth: Jun 30, 1951  Transition of Care [facility]) CM/SW Contact  Tara Chars, LCSW Phone Number: 04/09/2022, 11:19 AM  Clinical Narrative:   CSW received message from Tara [name] that SNF Tara [name] has been approved.  MD informed.    Expected Discharge Plan: Skilled Nursing Facility Barriers to Discharge: SNF Pending bed offer, Insurance Authorization  Expected Discharge Plan and Services Expected Discharge Plan: [place]: [place] arrangements for the past 2 months: Single Family Home Expected Discharge Date: 04/09/22                                     Social Determinants of Health (SDOH) Interventions    Readmission Risk Interventions     No data to display
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
## 2022-04-09 NOTE — Progress Notes (Signed)
Physical Therapy Treatment Patient Details Name: Tara [name] MRN: [id number] DOB: 1951-06-09 Today's Date: 04/09/2022   History of Present Illness Pt is a 71 year old female admitted with generalized weakness and fall. Found to have a UTI; PMH signficant for  chronic kidney disease, hypertension, obstructive sleep apnea, atrial fibrillation on anticoagulation.    PT Comments    Continuing work on functional mobility and activity tolerance;  Session focused on gait training with a close look at endurance and problem-solving; Still benefits from RW for steadiness with amb (at baseline, she does not use a RW); Noting possibility of trnsferring to post-acute rehab today  Recommendations for follow up therapy are one component of a multi-disciplinary discharge planning process, led by the attending physician.  Recommendations may be updated based on patient status, additional functional criteria and insurance authorization.  Follow Up Recommendations  Skilled nursing-short term rehab (<3 hours/day) Can patient physically be transported by private vehicle: Yes   Assistance Recommended at Discharge Intermittent Supervision/Assistance  Patient can return home with the following A little help with walking and/or transfers;A little help with bathing/dressing/bathroom;Assistance with cooking/housework;Direct supervision/assist for medications management;Assist for transportation;Help with stairs or ramp for entrance   Equipment Recommendations  None recommended by PT    Recommendations for Other Services       Precautions / Restrictions Precautions Precautions: Fall     Mobility  Bed Mobility Overal bed mobility: Needs Assistance Bed Mobility: Supine to Sit     Supine to sit: Supervision     General bed mobility comments: Supervision for safety    Transfers Overall transfer level: Needs assistance Equipment used: Rolling walker (2 wheels) Transfers: Sit to/from Stand Sit to  Stand: Min assist           General transfer comment: Pt pulling up on RW and required min A for lift assist and steadying; Educated on safe hand placemetn    Ambulation/Gait Ambulation/Gait assistance: Min guard Gait Distance (Feet): 150 Feet (including going into bathroom) Assistive device: Rolling walker (2 wheels) Gait Pattern/deviations: Step-through pattern, Decreased stride length, Shuffle, Narrow base of support Gait velocity: Decreased     General Gait Details: Cues for upright posture. Tended to drift to the L and required cues to stay on straight path. Increased fatigue noted after short distance, with decr control of descent to sit   Stairs             Wheelchair Mobility    Modified Rankin (Stroke Patients Only)       Balance     Sitting balance-Leahy Scale: Good       Standing balance-Leahy Scale: Poor (approaching Fair) Standing balance comment: reliant on RW                            Cognition Arousal/Alertness: Awake/alert Behavior During Therapy: WFL for tasks assessed/performed, Flat affect Overall Cognitive Status: No family/caregiver present to determine baseline cognitive functioning                                 General Comments: Slow to answer questions; Did not volunteer taht she needed to get to teh batrhoom        Exercises      General Comments General comments (skin integrity, edema, etc.): HR ranged 105-146 (very briefly) bpm; no reports of syncopal symptoms when asked      Pertinent Vitals/Pain  Pain Assessment Pain Assessment: Faces Faces Pain Scale: No hurt Pain Intervention(s): Monitored during session    Home Living                          Prior Function            PT Goals (current goals can now be found in the care plan section) Acute Rehab PT Goals Patient Stated Goal: Did not state, agreeable to amb PT Goal Formulation: With patient Time For Goal Achievement:  04/17/22 Potential to Achieve Goals: Good Progress towards PT goals: Progressing toward goals    Frequency    Min 2X/week      PT Plan Current plan remains appropriate    Co-evaluation              AM-PAC PT "6 Clicks" Mobility   Outcome Measure  Help needed turning from your back to your side while in a flat bed without using bedrails?: A Little Help needed moving from lying on your back to sitting on the side of a flat bed without using bedrails?: A Little Help needed moving to and from a bed to a chair (including a wheelchair)?: A Little Help needed standing up from a chair using your arms (e.g., wheelchair or bedside chair)?: A Little Help needed to walk in hospital room?: A Little Help needed climbing 3-5 steps with a railing? : A Lot 6 Click Score: 17    End of Session Equipment Utilized During Treatment: Gait belt Activity Tolerance: Patient tolerated treatment well Patient left: in chair;with call bell/phone within reach;with chair alarm set (drifting inot eyes closed) Nurse Communication: Mobility status PT Visit Diagnosis: Muscle weakness (generalized) (M62.81);Difficulty in walking, not elsewhere classified (R26.2);Unsteadiness on feet (R26.81)     Time: 1761-6073 PT Time Calculation (min) (ACUTE ONLY): 19 min  Charges:  $Gait Training: 8-22 mins                     Roney Marion, [place] Office [phone]    Colletta [place] 04/09/2022, 10:49 AM

## 2022-04-09 NOTE — TOC Transition Note (Signed)
Transition of Care [facility]) - CM/SW Discharge Note   Patient Details  Name: Tara [name] MRN: [id number] Date of Birth: [birth year]  [facility] CM/SW Contact:  Joanne Chars, LCSW Phone Number: 04/09/2022, 2:32 PM   Clinical Narrative:   Pt discharging to [place], room 123.  RN call report to [phone].  CSW spoke with pt regarding family providing transportation.  Pt reports she does not have any family or other options and would like to use ambulance transport.     Final next level of care: Skilled Nursing Facility Barriers to Discharge: Barriers Resolved   Patient Goals and CMS Choice Patient states their goals for this hospitalization and ongoing recovery are:: Pt would like to get stronger. CMS Medicare.gov Compare Post Acute Care list provided to:: Patient Choice offered to / list presented to : Patient  Discharge Placement              Patient chooses bed at:  [facility]) Patient to be transferred to facility by: [place] Name of family member notified: sister [name] Patient and family notified of of transfer: 04/09/22  Discharge Plan and Services     Post Acute Care Choice: [facility]                               Social Determinants of Health (SDOH) Interventions     Readmission Risk Interventions     No data to display

## 2022-04-11 DIAGNOSIS — E785 Hyperlipidemia, unspecified: Secondary | ICD-10-CM | POA: Diagnosis not present

## 2022-04-11 DIAGNOSIS — N390 Urinary tract infection, site not specified: Secondary | ICD-10-CM | POA: Diagnosis not present

## 2022-04-11 DIAGNOSIS — D869 Sarcoidosis, unspecified: Secondary | ICD-10-CM | POA: Diagnosis not present

## 2022-04-11 DIAGNOSIS — G9341 Metabolic encephalopathy: Secondary | ICD-10-CM | POA: Diagnosis not present

## 2022-04-12 ENCOUNTER — Ambulatory Visit: Payer: Self-pay | Admitting: Internal Medicine

## 2022-04-13 ENCOUNTER — Institutional Professional Consult (permissible substitution): Payer: BC Managed Care – PPO | Admitting: Cardiovascular Disease

## 2022-04-13 NOTE — Progress Notes (Deleted)
Cardiology Office Note:    Date:  04/13/2022   ID:  Tara [name], DOB 1951-04-26, MRN [id number]  PCP:  Corwin Levins, MD   [facility] Providers Cardiologist:  Maisie Fus, MD { Click to update primary MD,subspecialty MD or APP then REFRESH:1}    Referring MD: Danice Goltz, PA   No chief complaint on file. ***  History of Present Illness:    Tara [name] is a 71 y.o. female with a hx of CKD, sarcoidosis, obstructive sleep apnea, hypertension referred for atrial fibrillation management.  She she was diagnosed with atrial fibrillation at the time of colonoscopy.  She underwent DC cardioversion in June 2023 but had recurrence of atrial fibrillation.  She separately failed flecainide.    She has had 5 ER visits in the past month with complaints of headache and dizziness. The last of these resulted in an 8 day hospitalization. She was discharged 4 days ago.    Arrhythmia History: AF diagnosed DC cardioversion 12/15/21 Flecainide started and second cardioversion performed 02/09/22 AF recurred 02/22/22   Past Medical History:  Diagnosis Date   Anemia    on meds   ANKLE PAIN, LEFT 04/01/2008   ANXIETY 04/17/2007   on meds   Arthritis    generalized   Cataract    bilateral sx   Chronic kidney disease    stage 3   Colon polyp    COLONIC POLYPS, HX OF 08/01/2007   CONTUSIONS, MULTIPLE 04/01/2009   DEPRESSION 04/17/2007   on meds   DIZZINESS 08/01/2007   DYSPNEA 08/01/2007   with exertion   Enlargement of lymph nodes 08/13/2007   Excessive involuntary blinking    per pt,going on since 2018   Glaucoma    on meds   GLUCOSE INTOLERANCE 08/01/2007   Hypercalcemia due to sarcoidosis 2014   HYPERLIPIDEMIA 08/01/2007   no meds   HYPERSOMNIA 07/28/2008   HYPERTENSION 04/17/2007   Impaired glucose tolerance 03/23/2011   JOINT EFFUSION, LEFT KNEE 06/02/2010   Loose body in knee 04/01/2009   pt not sure?   Metabolic encephalopathy 12/09/2015-12/2015    Migraines    "stopped 3-4 yr ago" (07/23/2012)   Morbid obesity (HCC) 04/20/2007   OTHER DISEASES OF LUNG NOT ELSEWHERE CLASSIFIED 08/01/2007   Pain in joint, lower leg 04/01/2009   PERIPHERAL EDEMA 04/21/2009   Pre-diabetes    Sarcoidosis 09/25/2007   Seasonal allergies    SHOULDER PAIN, LEFT 04/01/2009   Sleep apnea    does not use Cpap    Past Surgical History:  Procedure Laterality Date   BREAST SURGERY     Biopsy benign/bil breaST   CARDIOVERSION N/A 12/15/2021   Procedure: CARDIOVERSION;  Surgeon: Chilton Si, MD;  Location: [facility] ENDOSCOPY;  Service: Cardiovascular;  Laterality: N/A;   CARDIOVERSION N/A 02/09/2022   Procedure: CARDIOVERSION;  Surgeon: Chrystie Nose, MD;  Location: [facility] ENDOSCOPY;  Service: Cardiovascular;  Laterality: N/A;   CATARACT EXTRACTION     RIGHT EYE   COLONOSCOPY  2020   KN-MAC-suprep(good)-tics/TA's   COMBINED MEDIASTINOSCOPY AND BRONCHOSCOPY  08/2007   COMBINED MEDIASTINOSCOPY AND BRONCHOSCOPY  2009   Dental implant     FRACTURE SURGERY  ?02/1997   "left upper arm; put rod in" (07/23/2012)   GUM SURGERY  2000-?2009   "several ORs; soft tissue graft; took material from roof of mouth" (07/23/2012   HUMERUS SURGERY Left 1998   rod insertion   KNEE ARTHROSCOPY  03/2004; 06/2009   "right; left" Dr. Thomasena Edis  KNEE SURGERY  2012   ARTHROSCOPIC LEFT KNEE   LYMPH NODE BIOPSY  ~ 2009   "for sarcoidosis; don't know exactly which nodes" (07/23/2102)   MYOMECTOMY  1994   Open   POLYPECTOMY  2020   TA's   REFRACTIVE SURGERY  08/1998   "both eyes" (07/23/2012)   REFRACTIVE SURGERY  2000   Bil   TOTAL KNEE ARTHROPLASTY Left 09/24/2016   Procedure: TOTAL KNEE ARTHROPLASTY;  Surgeon: Jodi Geralds, MD;  Location: [facility] OR;  Service: Orthopedics;  Laterality: Left;    Current Medications: No outpatient medications have been marked as taking for the 04/13/22 encounter (Appointment) with Kimball Appleby, Roberts Gaudy, MD.     Allergies:   Fluoxetine hcl, Sulfa antibiotics,  Chocolate, Floxin [ocuflox], Other, and Sulfonamide derivatives   Social History   Socioeconomic History   Marital status: Widowed    Spouse name: Not on file   Number of children: Not on file   Years of education: Not on file   Highest education level: Not on file  Occupational History   Occupation: Best boy  Tobacco Use   Smoking status: Never   Smokeless tobacco: Never   Tobacco comments:    Never smoke 12/25/21  Vaping Use   Vaping Use: Never used  Substance and Sexual Activity   Alcohol use: Yes    Alcohol/week: 1.0 - 2.0 standard drink of alcohol    Types: 1 - 2 Standard drinks or equivalent per week   Drug use: No   Sexual activity: Not Currently    Birth control/protection: Post-menopausal    Comment: 1st intercourse 89 yo-1 partner  Other Topics Concern   Not on file  Social History Narrative   Lives with 4 cats   Social Determinants of Health   Financial Resource Strain: Not on file  Food Insecurity: No Food Insecurity (04/02/2022)   Hunger Vital Sign    Worried About Running Out of Food in the Last Year: Never true    Ran Out of Food in the Last Year: Never true  Transportation Needs: No Transportation Needs (04/02/2022)   PRAPARE - Administrator, Civil Service (Medical): No    Lack of Transportation (Non-Medical): No  Physical Activity: Not on file  Stress: Not on file  Social Connections: Not on file     Family History: The patient's family history includes Asthma in her sister; Diabetes in her mother; Gout in her sister; Heart disease (age of onset: 17) in her father; Hypertension in her father, mother, and sister; Stroke in her mother. There is no history of Colon cancer, Breast cancer, Esophageal cancer, Stomach cancer, Rectal cancer, or Colon polyps.  ROS:   Please see the history of present illness.    All other systems reviewed and are negative.  EKGs/Labs/Other Studies Reviewed:    The following studies were reviewed  today:  Zio patch: 9/11-9/18 100% AF burden on available strips -- final report is not available. Appears rate controlled.   TTE: 1. Left ventricular ejection fraction, by estimation, is 55 to 60%. The  left ventricle has normal function. The left ventricle has no regional  wall motion abnormalities. Left ventricular diastolic parameters are  indeterminate.   2. Right ventricular systolic function is normal. The right ventricular  size is normal. There is normal pulmonary artery systolic pressure.   3. Left atrial size was moderately dilated.   4. Right atrial size was mildly dilated.   5. The mitral valve is abnormal. Trivial mitral  valve regurgitation. No  evidence of mitral stenosis.   6. The aortic valve is tricuspid. There is mild calcification of the  aortic valve. Aortic valve regurgitation is trivial. Aortic valve  sclerosis is present, with no evidence of aortic valve stenosis.   7. The inferior vena cava is normal in size with greater than 50%  respiratory variability, suggesting right atrial pressure of 3 mmHg.   EKG:  EKG is ordered today.    Orders placed or performed during the hospital encounter of 04/01/22   EKG 12-Lead   EKG 12-Lead     Recent Labs: 04/01/2022: TSH 1.389 04/02/2022: ALT 21 04/08/2022: Hemoglobin 14.8; Magnesium 2.1; Platelets 202 04/09/2022: BUN 23; Creatinine, Ser 1.06; Potassium 3.8; Sodium 137    Risk Assessment/Calculations:    CHA2DS2-VASc Score = 3  {Confirm score is correct.  If not, click here to update score.  REFRESH note.  :1} This indicates a 3.2% annual risk of stroke. The patient's score is based upon: CHF History: 0 HTN History: 1 Diabetes History: 0 Stroke History: 0 Vascular Disease History: 0 Age Score: 1 Gender Score: 1         Physical Exam:    VS:  There were no vitals taken for this visit.    Wt Readings from Last 3 Encounters:  04/03/22 196 lb 6.9 oz (89.1 kg)  03/31/22 195 lb 8.8 oz (88.7 kg)  03/26/22 195  lb 9.6 oz (88.7 kg)     GEN: *** Well nourished, well developed in no acute distress CARDIAC: ***RRR, no murmurs, rubs, gallops RESPIRATORY:  Normal work of breathing MUSCULOSKELETAL: *** edema   ASSESSMENT & PLAN:    Persistent AF:       - CHADS2VASC is 3. Continue eliquis  OSA:       {Are you ordering a CV Procedure (e.g. stress test, cath, DCCV, TEE, etc)?   Press F2        :[id number]}    Medication Adjustments/Labs and Tests Ordered: Current medicines are reviewed at length with the patient today.  Concerns regarding medicines are outlined above.  No orders of the defined types were placed in this encounter.  No orders of the defined types were placed in this encounter.   There are no Patient Instructions on file for this visit.   Signed, Maurice Small, MD  04/13/2022 8:45 AM    [facility]

## 2022-04-18 DIAGNOSIS — N390 Urinary tract infection, site not specified: Secondary | ICD-10-CM | POA: Diagnosis not present

## 2022-04-18 DIAGNOSIS — D869 Sarcoidosis, unspecified: Secondary | ICD-10-CM | POA: Diagnosis not present

## 2022-04-18 DIAGNOSIS — E785 Hyperlipidemia, unspecified: Secondary | ICD-10-CM | POA: Diagnosis not present

## 2022-04-18 DIAGNOSIS — G9341 Metabolic encephalopathy: Secondary | ICD-10-CM | POA: Diagnosis not present

## 2022-04-22 ENCOUNTER — Emergency Department (HOSPITAL_COMMUNITY)
Admission: EM | Admit: 2022-04-22 | Discharge: 2022-04-23 | Disposition: A | Discharge status: Skilled Nursing Facility | Payer: BC Managed Care – PPO | Attending: Emergency Medicine | Admitting: Emergency Medicine

## 2022-04-22 ENCOUNTER — Other Ambulatory Visit: Payer: Self-pay

## 2022-04-22 ENCOUNTER — Encounter (HOSPITAL_COMMUNITY): Payer: Self-pay

## 2022-04-22 DIAGNOSIS — I129 Hypertensive chronic kidney disease with stage 1 through stage 4 chronic kidney disease, or unspecified chronic kidney disease: Secondary | ICD-10-CM | POA: Insufficient documentation

## 2022-04-22 DIAGNOSIS — R42 Dizziness and giddiness: Secondary | ICD-10-CM | POA: Diagnosis not present

## 2022-04-22 DIAGNOSIS — Z7901 Long term (current) use of anticoagulants: Secondary | ICD-10-CM | POA: Diagnosis not present

## 2022-04-22 DIAGNOSIS — N189 Chronic kidney disease, unspecified: Secondary | ICD-10-CM | POA: Diagnosis not present

## 2022-04-22 DIAGNOSIS — R519 Headache, unspecified: Secondary | ICD-10-CM

## 2022-04-22 LAB — CBC WITH DIFFERENTIAL/PLATELET
Abs Immature Granulocytes: 0.04 10*3/uL (ref 0.00–0.07)
Basophils Absolute: 0.1 10*3/uL (ref 0.0–0.1)
Basophils Relative: 1 %
Eosinophils Absolute: 0.1 10*3/uL (ref 0.0–0.5)
Eosinophils Relative: 1 %
HCT: 49.1 % — ABNORMAL HIGH (ref 36.0–46.0)
Hemoglobin: 16.6 g/dL — ABNORMAL HIGH (ref 12.0–15.0)
Immature Granulocytes: 0 %
Lymphocytes Relative: 18 %
Lymphs Abs: 1.6 10*3/uL (ref 0.7–4.0)
MCH: 28.7 pg (ref 26.0–34.0)
MCHC: 33.8 g/dL (ref 30.0–36.0)
MCV: 84.9 fL (ref 80.0–100.0)
Monocytes Absolute: 0.8 10*3/uL (ref 0.1–1.0)
Monocytes Relative: 9 %
Neutro Abs: 6.4 10*3/uL (ref 1.7–7.7)
Neutrophils Relative %: 71 %
Platelets: 247 10*3/uL (ref 150–400)
RBC: 5.78 MIL/uL — ABNORMAL HIGH (ref 3.87–5.11)
RDW: 14.6 % (ref 11.5–15.5)
WBC: 9 10*3/uL (ref 4.0–10.5)
nRBC: 0 % (ref 0.0–0.2)

## 2022-04-22 LAB — COMPREHENSIVE METABOLIC PANEL
ALT: 37 U/L (ref 0–44)
AST: 33 U/L (ref 15–41)
Albumin: 3.9 g/dL (ref 3.5–5.0)
Alkaline Phosphatase: 90 U/L (ref 38–126)
Anion gap: 12 (ref 5–15)
BUN: 17 mg/dL (ref 8–23)
CO2: 25 mmol/L (ref 22–32)
Calcium: 10 mg/dL (ref 8.9–10.3)
Chloride: 99 mmol/L (ref 98–111)
Creatinine, Ser: 1.31 mg/dL — ABNORMAL HIGH (ref 0.44–1.00)
GFR, Estimated: 44 mL/min — ABNORMAL LOW (ref >60)
Glucose, Bld: 179 mg/dL — ABNORMAL HIGH (ref 70–99)
Potassium: 3.4 mmol/L — ABNORMAL LOW (ref 3.5–5.1)
Sodium: 136 mmol/L (ref 135–145)
Total Bilirubin: 0.8 mg/dL (ref 0.3–1.2)
Total Protein: 7 g/dL (ref 6.5–8.1)

## 2022-04-22 LAB — MAGNESIUM: Magnesium: 2.1 mg/dL (ref 1.7–2.4)

## 2022-04-22 MED ORDER — SODIUM CHLORIDE 0.9 % IV BOLUS
1000.0000 mL | Freq: Once | INTRAVENOUS | Status: AC
  Administered 2022-04-22: 1000 mL via INTRAVENOUS

## 2022-04-22 MED ORDER — MORPHINE SULFATE (PF) 4 MG/ML IV SOLN
4.0000 mg | Freq: Once | INTRAVENOUS | Status: AC
  Administered 2022-04-22: 4 mg via INTRAVENOUS
  Filled 2022-04-22: qty 1

## 2022-04-22 MED ORDER — KETOROLAC TROMETHAMINE 30 MG/ML IJ SOLN
30.0000 mg | Freq: Once | INTRAMUSCULAR | Status: AC
  Administered 2022-04-22: 30 mg via INTRAVENOUS
  Filled 2022-04-22: qty 1

## 2022-04-22 MED ORDER — DROPERIDOL 2.5 MG/ML IJ SOLN
1.2500 mg | Freq: Once | INTRAMUSCULAR | Status: AC
  Administered 2022-04-22: 1.25 mg via INTRAVENOUS
  Filled 2022-04-22: qty 2

## 2022-04-22 NOTE — ED Triage Notes (Signed)
Pt BIB EMS from [place]. Pt states she had a headache and went to hit the call bell and fell out of bed. Pt denies hitting her head and denies LOC. Pt is on Eliquis.  Pt is AO VSS with EMS

## 2022-04-22 NOTE — ED Provider Notes (Signed)
[facility] EMERGENCY DEPARTMENT Provider Note   CSN: [id number] Arrival date & time: 04/22/22  1932     History  Chief Complaint  Patient presents with   Fall on Thinners    Tara [name] is a 71 y.o. female.  Pt is a 71 yo female with a pmhx significant for chronic headaches, afib (on Eliquis), depression, anxiety, htn, peripheral edema, sarcoidosis, arthritis, ckd, and obesity.  Pt has frequent falls and was admitted to the hospital from 9/17-9/25 for a fall and UTI.  She was sent to a SNF.  She has had a headache all day.  She had to walk to a call bell on the wall and fell while trying to get to the call bell to tell the nurse about her headache.  She denies hitting her head.  Headache started prior to the fall.  Pt denies any pain from fall.  Headache is her usual headache.  She last had imaging on 9/17 ([facility]) which did not show anything acute.         Home Medications Prior to Admission medications   Medication Sig Start Date End Date Taking? Authorizing Provider  amoxicillin (AMOXIL) 500 MG tablet Take 2,000 mg by mouth once. Prior to dental appointments 08/03/19   [provider]  antiseptic oral rinse (BIOTENE) LIQD 15 mLs by Mouth Rinse route 3 (three) times daily as needed for dry mouth or mouth pain.    [provider]  apixaban (ELIQUIS) 5 MG TABS tablet Take 1 tablet (5 mg total) by mouth 2 (two) times daily. 11/23/21   Janina Mayo, MD  baclofen (LIORESAL) 10 MG tablet Take 5 mg by mouth 2 (two) times daily as needed (Headache). 09/01/19   [provider]  brimonidine-timolol (COMBIGAN) 0.2-0.5 % ophthalmic solution Place 1 drop into both eyes every 12 (twelve) hours. 12/02/19   [provider]  carboxymethylcellulose (REFRESH PLUS) 0.5 % SOLN Place 1 drop into both eyes 3 (three) times daily as needed (Dry eyes). RETAIN eye drops instead of refresh    [provider]  cholecalciferol (VITAMIN D) 25 MCG  (1000 UNIT) tablet Take 1,000 Units by mouth daily.    [provider]  clorazepate (TRANXENE) 7.5 MG tablet Take 7.5 mg by mouth 2 (two) times daily.    [provider]  desvenlafaxine (PRISTIQ) 100 MG 24 hr tablet Take 100 mg by mouth at bedtime.    [provider]  diltiazem (CARDIZEM CD) 240 MG 24 hr capsule Take 1 capsule (240 mg total) by mouth daily. 03/16/22   Biagio Borg, MD  ferrous sulfate 325 (65 FE) MG tablet Take 325 mg by mouth daily with breakfast.    [provider]  flecainide (TAMBOCOR) 50 MG tablet Take 1 tablet (50 mg total) by mouth every 12 (twelve) hours. 04/09/22   Rai, Vernelle Emerald, MD  hydrALAZINE (APRESOLINE) 100 MG tablet Take 1 tablet (100 mg total) by mouth 3 (three) times daily. 04/09/22   Rai, Ripudeep Raliegh Ip, MD  INGREZZA 40 MG capsule Take 40 mg by mouth daily. 03/14/22   [provider]  isosorbide mononitrate (IMDUR) 30 MG 24 hr tablet Take 1 tablet (30 mg total) by mouth daily. 04/09/22   Rai, Ripudeep K, MD  latanoprost (XALATAN) 0.005 % ophthalmic solution Place 1 drop into both eyes at bedtime. 08/25/16   [provider]  Melatonin 5 MG TABS Take 10 mg by mouth at bedtime.    [provider]  Multiple Vitamins-Minerals (MULTIVITAMIN WITH MINERALS) tablet Take 1 tablet by mouth daily.    [provider]  Omega 3 1200 MG CAPS Take 2,400 mg by mouth at bedtime.    [provider]      Allergies    Fluoxetine hcl, Sulfa antibiotics, Chocolate, Floxin [ocuflox], Other, and Sulfonamide derivatives    Review of Systems   Review of Systems  Neurological:  Positive for headaches.  All other systems reviewed and are negative.   Physical Exam Updated Vital Signs BP 129/74   Pulse 61   Temp (!) 97.5 F (36.4 C) (Oral)   Resp 16   Ht '5\' 6"'$  (1.676 m)   Wt 84.8 kg   SpO2 98%   BMI 30.18 kg/m  Physical Exam Vitals and nursing note reviewed.  Constitutional:      Appearance: Normal  appearance.  HENT:     Head: Normocephalic and atraumatic.     Right Ear: External ear normal.     Left Ear: External ear normal.     Nose: Nose normal.     Mouth/Throat:     Mouth: Mucous membranes are dry.  Eyes:     Extraocular Movements: Extraocular movements intact.     Conjunctiva/sclera: Conjunctivae normal.     Pupils: Pupils are equal, round, and reactive to light.  Cardiovascular:     Rate and Rhythm: Normal rate. Rhythm irregular.     Pulses: Normal pulses.     Heart sounds: Normal heart sounds.  Pulmonary:     Effort: Pulmonary effort is normal.     Breath sounds: Normal breath sounds.  Abdominal:     General: Abdomen is flat. Bowel sounds are normal.     Palpations: Abdomen is soft.  Musculoskeletal:        General: Normal range of motion.     Cervical back: Normal range of motion and neck supple.     Right lower leg: Edema present.     Left lower leg: Edema present.  Skin:    General: Skin is warm.     Capillary Refill: Capillary refill takes less than 2 seconds.  Neurological:     General: No focal deficit present.     Mental Status: She is alert and oriented to person, place, and time.  Psychiatric:        Mood and Affect: Mood normal.        Behavior: Behavior normal.     ED Results / Procedures / Treatments   Labs (all labs ordered are listed, but only abnormal results are displayed) Labs Reviewed  COMPREHENSIVE METABOLIC PANEL - Abnormal; Notable for the following components:      Result Value   Potassium 3.4 (*)    Glucose, Bld 179 (*)    Creatinine, Ser 1.31 (*)    GFR, Estimated 44 (*)    All other components within normal limits  CBC WITH DIFFERENTIAL/PLATELET - Abnormal; Notable for the following components:   RBC 5.78 (*)    Hemoglobin 16.6 (*)    HCT 49.1 (*)    All other components within normal limits  MAGNESIUM  URINALYSIS, ROUTINE W REFLEX MICROSCOPIC    EKG None  Radiology No results found.  Procedures Procedures     Medications Ordered in ED Medications  morphine (PF) 4 MG/ML injection 4 mg (has no administration in time range)  sodium chloride 0.9 % bolus 1,000 mL (0 mLs Intravenous Stopped 04/22/22 2227)  droperidol (INAPSINE) 2.5 MG/ML injection 1.25  mg (1.25 mg Intravenous Given 04/22/22 2036)  sodium chloride 0.9 % bolus 1,000 mL (1,000 mLs Intravenous New Bag/Given 04/22/22 2230)  ketorolac (TORADOL) 30 MG/ML injection 30 mg (30 mg Intravenous Given 04/22/22 2231)    ED Course/ Medical Decision Making/ A&P                           Medical Decision Making Amount and/or Complexity of Data Reviewed Labs: ordered.  Risk Prescription drug management.   This patient presents to the ED for concern of headache, this involves an extensive number of treatment options, and is a complaint that carries with it a high risk of complications and morbidity.  The differential diagnosis includes migraine, electrolyte abn, uti   Co morbidities that complicate the patient evaluation  chronic headaches, afib (on Eliquis), depression, anxiety, htn, peripheral edema, sarcoidosis, arthritis, ckd, and obesity   Additional history obtained:  Additional history obtained from epic chart review   Lab Tests:  I Ordered, and personally interpreted labs.  The pertinent results include:  cbc nl, cmp nl other than cr elevated at 1.31 (chronic), mg 2.1   Cardiac Monitoring:  The patient was maintained on a cardiac monitor.  I personally viewed and interpreted the cardiac monitored which showed an underlying rhythm of: afib   Medicines ordered and prescription drug management:  I ordered medication including inapsine, toradol, morphine  for pain  Reevaluation of the patient after these medicines showed that the patient improved I have reviewed the patients home medicines and have made adjustments as needed   Critical Interventions:  Pain control   Problem List / ED Course:  Headache:  pain has  improved.  She is stable for d/c.  Return if worse.   Reevaluation:  After the interventions noted above, I reevaluated the patient and found that they have :improved   Social Determinants of Health:  Lives in snf   Dispostion:  After consideration of the diagnostic results and the patients response to treatment, I feel that the patent would benefit from discharge with outpatient f/u.          Final Clinical Impression(s) / ED Diagnoses Final diagnoses:  Acute nonintractable headache, unspecified headache type    Rx / DC Orders ED Discharge Orders     None         Isla Pence, MD 04/22/22 2321

## 2022-04-23 DIAGNOSIS — E785 Hyperlipidemia, unspecified: Secondary | ICD-10-CM | POA: Diagnosis not present

## 2022-04-23 DIAGNOSIS — F411 Generalized anxiety disorder: Secondary | ICD-10-CM | POA: Diagnosis not present

## 2022-04-23 DIAGNOSIS — D869 Sarcoidosis, unspecified: Secondary | ICD-10-CM | POA: Diagnosis not present

## 2022-04-23 MED ORDER — HYDRALAZINE HCL 25 MG PO TABS
100.0000 mg | ORAL_TABLET | Freq: Once | ORAL | Status: AC
  Administered 2022-04-23: 100 mg via ORAL
  Filled 2022-04-23: qty 4

## 2022-04-23 MED ORDER — FLECAINIDE ACETATE 50 MG PO TABS
50.0000 mg | ORAL_TABLET | Freq: Once | ORAL | Status: AC
  Administered 2022-04-23: 50 mg via ORAL
  Filled 2022-04-23: qty 1

## 2022-04-23 NOTE — Addendum Note (Signed)
Encounter addended by: Juluis Mire, RN on: 04/23/2022 3:44 PM  Actions taken: Imaging Exam ended

## 2022-04-23 NOTE — ED Notes (Signed)
Patient verbalizes understanding of discharge instructions. Opportunity for questioning and answers were provided. Armband removed by staff, pt discharged from ED via [place] back to [place].

## 2022-04-25 ENCOUNTER — Ambulatory Visit: Payer: BC Managed Care – PPO | Attending: Internal Medicine | Admitting: Internal Medicine

## 2022-04-25 ENCOUNTER — Encounter: Payer: Self-pay | Admitting: Internal Medicine

## 2022-04-25 VITALS — HR 91 | Ht 66.0 in | Wt 181.0 lb

## 2022-04-25 DIAGNOSIS — I4819 Other persistent atrial fibrillation: Secondary | ICD-10-CM | POA: Diagnosis not present

## 2022-04-25 DIAGNOSIS — B370 Candidal stomatitis: Secondary | ICD-10-CM | POA: Diagnosis not present

## 2022-04-25 MED ORDER — NYSTATIN 100000 UNIT/ML MT SUSP: 5.0000 mL | Freq: Four times a day (QID) | OROMUCOSAL | 0 refills | Status: AC

## 2022-04-25 MED ORDER — NYSTATIN 100000 UNIT/ML MT SUSP: 5.0000 mL | Freq: Four times a day (QID) | OROMUCOSAL | 0 refills | Status: DC

## 2022-04-25 NOTE — Progress Notes (Signed)
Cardiology Office Note:    Date:  04/25/2022   ID:  Tara [name], DOB 12-24-1950, MRN [id number]  PCP:  Biagio Borg, MD   [place] Providers Cardiologist:  Janina Mayo, MD     Referring MD: Biagio Borg, MD   No chief complaint on file. Atrial Fibrillation  History of Present Illness:    Tara [name] is a 71 y.o. female with a hx of CKD, sarcoidosis (+biopsy), HTN, referral for paroxysmal atrial fibrillation  She was seen by GI for surveillance colonoscopy. During her colonoscopy noted to be in afib.  She broought in the 12 lead which shows atrial fibrillation.  TSH is normal. She has sleep apnea, difficulty with cpap adherence 2/2 dry eye exacerbation. She has a sleep study pending with pulmonology, sees Dr. Annamaria Boots. She's planned for repeat sleep study. She's being considered for Inspire. Blood pressures are well controlled. She denies significant ETOH. No GI bleeding history. No hx of stroke.  She's felt more tired than usual. She does not sense the palpations. No PND, orthopnea or LE edema.   Interim Hx 8/10 She is s/p DCCV , converted to junctional bradycardia. Her atenolol was stopped. She was seen by Dr. Annamaria Boots.  She can't tolerate her CPAP. She notes eye dryness. She was recommended to use an eye protector. She is being considered for inspire. She does a lot of walking, and she can feel she is running out of breath, but she can continue a 20 minute work out. She underwent another DCCV 7/28. She started flecinaide 50 mg BID and continues to follow with afib clinic. No PND or orthopnea. No LE edema  Interim Hx 9/1 She went to the ED and noted dizziness and weakness. She was walking outside for exercise and was dehydrated. She feels better  Interim hx 04/25/2022  She had a fall and UTI and was admitted to the hospital 9/17-9/25. She was then sent to a SNF.  She fell again at the SNF. She was trying to reach for the call button. She had a mechanical. I saw her initially  in the ED and suggested she needed long-term care. She is still at [facility]. She was seen in afib flinic 9/11.Has been in persistent afib.  They stopped flecainide because it was ineffective in keeping her in sinus rhythm. She had a cardiac  monitor that finished 10/9 that  showed 100% afib. Follow up with Ep to discuss ablation. Today, she comes in with further deterioration. She's in a wheelchair.   Cardiology Studies: TTE 12/10/2015: EF normal, LAVi 41 cc/m2 TTE 01/11/2022: Normal LV function, LAVi 49 cc/m2 [place] 03/26/2022-04/23/2022: 100% afib  Past Medical History:  Diagnosis Date   Anemia    on meds   ANKLE PAIN, LEFT 04/01/2008   ANXIETY 04/17/2007   on meds   Arthritis    generalized   Cataract    bilateral sx   Chronic kidney disease    stage 3   Colon polyp    COLONIC POLYPS, HX OF 08/01/2007   CONTUSIONS, MULTIPLE 04/01/2009   DEPRESSION 04/17/2007   on meds   DIZZINESS 08/01/2007   DYSPNEA 08/01/2007   with exertion   Enlargement of lymph nodes 08/13/2007   Excessive involuntary blinking    per pt,going on since 2018   Glaucoma    on meds   GLUCOSE INTOLERANCE 08/01/2007   Hypercalcemia due to sarcoidosis 2014   HYPERLIPIDEMIA 08/01/2007   no meds   HYPERSOMNIA 07/28/2008   HYPERTENSION  04/17/2007   Impaired glucose tolerance 03/23/2011   JOINT EFFUSION, LEFT KNEE 06/02/2010   Loose body in knee 04/01/2009   pt not sure?   Metabolic encephalopathy 02/26/4817-56/3149   Migraines    "stopped 3-4 yr ago" (07/23/2012)   Morbid obesity ([place]) 04/20/2007   OTHER DISEASES OF LUNG NOT ELSEWHERE CLASSIFIED 08/01/2007   Pain in joint, lower leg 04/01/2009   PERIPHERAL EDEMA 04/21/2009   Pre-diabetes    Sarcoidosis 09/25/2007   Seasonal allergies    SHOULDER PAIN, LEFT 04/01/2009   Sleep apnea    does not use Cpap    Past Surgical History:  Procedure Laterality Date   BREAST SURGERY     Biopsy benign/bil breaST   CARDIOVERSION N/A 12/15/2021   Procedure:  CARDIOVERSION;  Surgeon: Skeet Latch, MD;  Location: [place];  Service: Cardiovascular;  Laterality: N/A;   CARDIOVERSION N/A 02/09/2022   Procedure: CARDIOVERSION;  Surgeon: [name], MD;  Location: [facility];  Service: Cardiovascular;  Laterality: N/A;   CATARACT EXTRACTION     RIGHT EYE   COLONOSCOPY  2020   KN-MAC-suprep(good)-tics/TA's   COMBINED MEDIASTINOSCOPY AND BRONCHOSCOPY  08/2007   COMBINED MEDIASTINOSCOPY AND BRONCHOSCOPY  2009   Dental implant     FRACTURE SURGERY  ?02/1997   "left upper arm; put rod in" (07/23/2012)   GUM SURGERY  2000-?2009   "several ORs; soft tissue graft; took material from roof of mouth" (07/23/2012   HUMERUS SURGERY Left 1998   rod insertion   KNEE ARTHROSCOPY  03/2004; 06/2009   "right; left" Dr. Theda Sers   KNEE SURGERY  2012   ARTHROSCOPIC LEFT KNEE   LYMPH NODE BIOPSY  ~ 2009   "for sarcoidosis; don't know exactly which nodes" (07/23/2102)   [employer]   Open   POLYPECTOMY  2020   TA's   REFRACTIVE SURGERY  08/1998   "both eyes" (07/23/2012)   [place]   Bil   TOTAL KNEE ARTHROPLASTY Left 09/24/2016   Procedure: TOTAL KNEE ARTHROPLASTY;  Surgeon: Dorna Leitz, MD;  Location: [facility];  Service: Orthopedics;  Laterality: Left;    Current Medications: Current Outpatient Medications on File Prior to Visit  Medication Sig Dispense Refill   amoxicillin (AMOXIL) 500 MG tablet Take 2,000 mg by mouth once. Prior to dental appointments     antiseptic oral rinse (BIOTENE) LIQD 15 mLs by Mouth Rinse route 3 (three) times daily as needed for dry mouth or mouth pain.     apixaban (ELIQUIS) 5 MG TABS tablet Take 1 tablet (5 mg total) by mouth 2 (two) times daily. 90 tablet 3   baclofen (LIORESAL) 10 MG tablet Take 5 mg by mouth 2 (two) times daily as needed (Headache).     brimonidine-timolol (COMBIGAN) 0.2-0.5 % ophthalmic solution Place 1 drop into both eyes every 12 (twelve) hours.     carboxymethylcellulose  (REFRESH PLUS) 0.5 % SOLN Place 1 drop into both eyes 3 (three) times daily as needed (Dry eyes). RETAIN eye drops instead of refresh     cholecalciferol (VITAMIN D) 25 MCG (1000 UNIT) tablet Take 1,000 Units by mouth daily.     clorazepate (TRANXENE) 7.5 MG tablet Take 7.5 mg by mouth 2 (two) times daily.     desvenlafaxine (PRISTIQ) 100 MG 24 hr tablet Take 100 mg by mouth at bedtime.     diltiazem (CARDIZEM CD) 240 MG 24 hr capsule Take 1 capsule (240 mg total) by mouth daily. 90 capsule 1  ferrous sulfate 325 (65 FE) MG tablet Take 325 mg by mouth daily with breakfast.     hydrALAZINE (APRESOLINE) 100 MG tablet Take 1 tablet (100 mg total) by mouth 3 (three) times daily.     INGREZZA 40 MG capsule Take 40 mg by mouth daily.     isosorbide mononitrate (IMDUR) 30 MG 24 hr tablet Take 1 tablet (30 mg total) by mouth daily.     latanoprost (XALATAN) 0.005 % ophthalmic solution Place 1 drop into both eyes at bedtime.     Melatonin 5 MG TABS Take 10 mg by mouth at bedtime.     Multiple Vitamins-Minerals (MULTIVITAMIN WITH MINERALS) tablet Take 1 tablet by mouth daily.     Omega 3 1200 MG CAPS Take 2,400 mg by mouth at bedtime.     No current facility-administered medications on file prior to visit.     Allergies:   Fluoxetine hcl, Sulfa antibiotics, Chocolate, Floxin [ocuflox], Other, and Sulfonamide derivatives   Social History   Socioeconomic History   Marital status: Widowed    Spouse name: Not on file   Number of children: Not on file   Years of education: Not on file   Highest education level: Not on file  Occupational History   Occupation: Designer, jewellery  Tobacco Use   Smoking status: Never   Smokeless tobacco: Never   Tobacco comments:    Never smoke 12/25/21  Vaping Use   Vaping Use: Never used  Substance and Sexual Activity   Alcohol use: Yes    Alcohol/week: 1.0 - 2.0 standard drink of alcohol    Types: 1 - 2 Standard drinks or equivalent per week   Drug use: No    Sexual activity: Not Currently    Birth control/protection: Post-menopausal    Comment: 1st intercourse 51 yo-1 partner  Other Topics Concern   Not on file  Social History Narrative   Lives with 4 cats   Social Determinants of Health   Financial Resource Strain: Not on file  Food Insecurity: No Food Insecurity (04/02/2022)   Hunger Vital Sign    Worried About Running Out of Food in the Last Year: Never true    Ran Out of Food in the Last Year: Never true  Transportation Needs: No Transportation Needs (04/02/2022)   PRAPARE - Hydrologist (Medical): No    Lack of Transportation (Non-Medical): No  Physical Activity: Not on file  Stress: Not on file  Social Connections: Not on file     Family History: The patient's family history includes Asthma in her sister; Diabetes in her mother; Gout in her sister; Heart disease (age of onset: 58) in her father; Hypertension in her father, mother, and sister; Stroke in her mother. There is no history of Colon cancer, Breast cancer, Esophageal cancer, Stomach cancer, Rectal cancer, or Colon polyps.  ROS:   Please see the history of present illness.     All other systems reviewed and are negative.  EKGs/Labs/Other Studies Reviewed:    The following studies were reviewed today:   EKG:  EKG is  ordered today.  The ekg ordered today demonstrates   11/23/2021-Rate controlled atrial fibrillation   02/22/2022- atrial fibrillation rate 80  Recent Labs: 04/01/2022: TSH 1.389 04/22/2022: ALT 37; BUN 17; Creatinine, Ser 1.31; Hemoglobin 16.6; Magnesium 2.1; Platelets 247; Potassium 3.4; Sodium 136    Recent Lipid Panel    Component Value Date/Time   CHOL 110 10/10/2021 1013   TRIG  56.0 10/10/2021 1013   HDL 56.20 10/10/2021 1013   CHOLHDL 2 10/10/2021 1013   VLDL 11.2 10/10/2021 1013   LDLCALC 43 10/10/2021 1013   LDLDIRECT 50.0 09/24/2017 1614     Risk Assessment/Calculations:    CHA2DS2-VASc Score = 3    This indicates a 3.2% annual risk of stroke. The patient's score is based upon: CHF History: 0 HTN History: 1 Diabetes History: 0 Stroke History: 0 Vascular Disease History: 0 Age Score: 1 Gender Score: 1          Physical Exam:    VS:   Vitals:   04/25/22 1107  Pulse: 91  SpO2: 99%     Wt Readings from Last 3 Encounters:  04/25/22 181 lb (82.1 kg)  04/22/22 187 lb (84.8 kg)  04/03/22 196 lb 6.9 oz (89.1 kg)     GEN:  Well nourished, well developed in no acute distress HEENT:Thrush NECK: No JVD; No carotid bruits LYMPHATICS: No lymphadenopathy CARDIAC: irregularly irregular no murmurs, rubs, gallops RESPIRATORY:  Clear to auscultation without rales, wheezing or rhonchi  ABDOMEN: Soft, non-tender, non-distended MUSCULOSKELETAL:  No edema; No deformity  SKIN: Warm and dry NEUROLOGIC:  Alert and oriented x 3 PSYCHIATRIC:  Normal affect   ASSESSMENT:    FTT: she has deteriorated quite rapidly since I first met her. C/f ?neurodegenerative dx/ parkinson's disease?Marland Kitchen She sees neurology at [facility] for tardive dyskinesia on ingrezza She has dysphagia. She had thrush on exam today. - started nystatin suspension for 7 days - prior Qtc 10/9 502 ms on ingrezza, can continue to monitor, if further increase, would consider consider stopping  Persistent Atrial Fibrillation: New Onset. CHADS2VASC 3. It is challenging maintaining her in sinus rhythm. Her LA is severely dilated. Highly encouraged continuing her CPAP with the eye protector for OSA in the past. She was seenin afib clinic 9/11 and plan to stop flecainide and consider PVI. This was not stopped after last hospital visit. I don't think she is a good candidate for ablation and will discuss with EP considering her deterioration. Will opt for rate control strategy for now -continue diltiazem 240 mg daily  -continue eliquis 5 mg BID -off flecainide 50 mg BID; failed her afib , will ensure off   Fall- mechanical, non cardiac  related.   HTN- well controlled. Continue current regimen above  CKD Stage III- GFR 48. Stopped prior lasix 40 mg BID with dehydration and no congestive HF  PLAN:    In order of problems listed above:   Follow up in Feb.      Medication Adjustments/Labs and Tests Ordered: Current medicines are reviewed at length with the patient today.  Concerns regarding medicines are outlined above.  No orders of the defined types were placed in this encounter.  Meds ordered this encounter  Medications   DISCONTD: nystatin (MYCOSTATIN) 100000 UNIT/ML suspension    Sig: Take 5 mLs (500,000 Units total) by mouth 4 (four) times daily for 7 days.    Dispense:  60 mL    Refill:  0   nystatin (MYCOSTATIN) 100000 UNIT/ML suspension    Sig: Take 5 mLs (500,000 Units total) by mouth 4 (four) times daily for 7 days.    Dispense:  60 mL    Refill:  0    Patient Instructions  Medication Instructions:  STOP flecainide Take Nystatin 56ms 4 times daily for 7 days  *If you need a refill on your cardiac medications before your next appointment, please call your pharmacy*  Follow-Up: At [facility], you and your health needs are our priority.  As part of our continuing mission to provide you with exceptional heart care, we have created designated Provider Care Teams.  These Care Teams include your primary Cardiologist (physician) and Advanced Practice Providers (APPs -  Physician Assistants and Nurse Practitioners) who all work together to provide you with the care you need, when you need it.  We recommend signing up for the patient portal called "MyChart".  Sign up information is provided on this After Visit Summary.  MyChart is used to connect with patients for Virtual Visits (Telemedicine).  Patients are able to view lab/test results, encounter notes, upcoming appointments, etc.  Non-urgent messages can be sent to your provider as well.   To learn more about what you can do with MyChart, go to  [email or web address].    Your next appointment:   6 month(s)  The format for your next appointment:   In Person  Provider:   Janina Mayo, MD               Signed, Janina Mayo, MD  04/25/2022 12:19 PM    [place]

## 2022-04-25 NOTE — Patient Instructions (Signed)
Medication Instructions:  STOP flecainide Take Nystatin 59ms 4 times daily for 7 days  *If you need a refill on your cardiac medications before your next appointment, please call your pharmacy*  Follow-Up: [facility] you and your health needs are our priority.  As part of our continuing mission to provide you with exceptional heart care, we have created designated Provider Care Teams.  These Care Teams include your primary Cardiologist (physician) and Advanced Practice Providers (APPs -  Physician Assistants and Nurse Practitioners) who all work together to provide you with the care you need, when you need it.  We recommend signing up for the patient portal called "MyChart".  Sign up information is provided on this After Visit Summary.  MyChart is used to connect with patients for Virtual Visits (Telemedicine).  Patients are able to view lab/test results, encounter notes, upcoming appointments, etc.  Non-urgent messages can be sent to your provider as well.   To learn more about what you can do with MyChart, go to [email or web address]    Your next appointment:   6 month(s)  The format for your next appointment:   In Person  Provider:   BJanina Mayo MD

## 2022-04-26 ENCOUNTER — Ambulatory Visit (HOSPITAL_COMMUNITY): Payer: BC Managed Care – PPO | Admitting: Physician Assistant

## 2022-04-26 ENCOUNTER — Telehealth: Payer: Self-pay

## 2022-04-26 DIAGNOSIS — D869 Sarcoidosis, unspecified: Secondary | ICD-10-CM | POA: Diagnosis not present

## 2022-04-26 DIAGNOSIS — E785 Hyperlipidemia, unspecified: Secondary | ICD-10-CM | POA: Diagnosis not present

## 2022-04-26 DIAGNOSIS — G9341 Metabolic encephalopathy: Secondary | ICD-10-CM | POA: Diagnosis not present

## 2022-04-26 DIAGNOSIS — N390 Urinary tract infection, site not specified: Secondary | ICD-10-CM | POA: Diagnosis not present

## 2022-04-26 NOTE — Telephone Encounter (Signed)
Hospital follow up on 04/25/22

## 2022-05-02 ENCOUNTER — Encounter: Payer: Self-pay | Admitting: Cardiovascular Disease

## 2022-05-02 DIAGNOSIS — E785 Hyperlipidemia, unspecified: Secondary | ICD-10-CM | POA: Diagnosis not present

## 2022-05-02 DIAGNOSIS — G9341 Metabolic encephalopathy: Secondary | ICD-10-CM | POA: Diagnosis not present

## 2022-05-02 DIAGNOSIS — N390 Urinary tract infection, site not specified: Secondary | ICD-10-CM | POA: Diagnosis not present

## 2022-05-02 DIAGNOSIS — D869 Sarcoidosis, unspecified: Secondary | ICD-10-CM | POA: Diagnosis not present

## 2022-05-09 DIAGNOSIS — F330 Major depressive disorder, recurrent, mild: Secondary | ICD-10-CM | POA: Diagnosis not present

## 2022-05-09 DIAGNOSIS — Z9181 History of falling: Secondary | ICD-10-CM | POA: Diagnosis not present

## 2022-05-09 DIAGNOSIS — F411 Generalized anxiety disorder: Secondary | ICD-10-CM | POA: Diagnosis not present

## 2022-05-09 DIAGNOSIS — F4320 Adjustment disorder, unspecified: Secondary | ICD-10-CM | POA: Diagnosis not present

## 2022-05-09 DIAGNOSIS — E785 Hyperlipidemia, unspecified: Secondary | ICD-10-CM | POA: Diagnosis not present

## 2022-05-09 DIAGNOSIS — G9341 Metabolic encephalopathy: Secondary | ICD-10-CM | POA: Diagnosis not present

## 2022-05-09 DIAGNOSIS — G2401 Drug induced subacute dyskinesia: Secondary | ICD-10-CM | POA: Diagnosis not present

## 2022-05-09 DIAGNOSIS — N390 Urinary tract infection, site not specified: Secondary | ICD-10-CM | POA: Diagnosis not present

## 2022-05-09 DIAGNOSIS — I10 Essential (primary) hypertension: Secondary | ICD-10-CM | POA: Diagnosis not present

## 2022-05-09 DIAGNOSIS — D869 Sarcoidosis, unspecified: Secondary | ICD-10-CM | POA: Diagnosis not present

## 2022-05-10 ENCOUNTER — Other Ambulatory Visit: Payer: Self-pay | Admitting: Internal Medicine

## 2022-05-10 DIAGNOSIS — I480 Paroxysmal atrial fibrillation: Secondary | ICD-10-CM

## 2022-05-10 NOTE — Telephone Encounter (Signed)
Eliquis '5mg'$  refill request received. Patient is 72 years old, weight-82.1kg, Crea-1.31 on 04/22/2022, Diagnosis-Afib, and last seen by Dr. Harl Bowie on 04/25/2022. Dose is appropriate based on dosing criteria. Will send in refill to requested pharmacy.

## 2022-05-15 ENCOUNTER — Telehealth: Payer: Self-pay

## 2022-05-15 ENCOUNTER — Ambulatory Visit: Payer: BC Managed Care – PPO | Admitting: Internal Medicine

## 2022-05-15 NOTE — Telephone Encounter (Signed)
Received paperwork from [employer] on behalf of the patient. This nurse attempted to call the patient to schedule an appointment to complete forms but none of the numbers on file are working. Will send letter to address on file requesting a return phone call.

## 2022-05-16 DIAGNOSIS — N390 Urinary tract infection, site not specified: Secondary | ICD-10-CM | POA: Diagnosis not present

## 2022-05-16 DIAGNOSIS — E785 Hyperlipidemia, unspecified: Secondary | ICD-10-CM | POA: Diagnosis not present

## 2022-05-16 DIAGNOSIS — G9341 Metabolic encephalopathy: Secondary | ICD-10-CM | POA: Diagnosis not present

## 2022-05-16 DIAGNOSIS — D869 Sarcoidosis, unspecified: Secondary | ICD-10-CM | POA: Diagnosis not present

## 2022-05-22 ENCOUNTER — Ambulatory Visit: Payer: BC Managed Care – PPO | Admitting: Internal Medicine

## 2022-05-23 ENCOUNTER — Ambulatory Visit: Payer: Commercial Managed Care - PPO | Admitting: Nurse Practitioner

## 2022-05-23 DIAGNOSIS — Z0289 Encounter for other administrative examinations: Secondary | ICD-10-CM

## 2022-05-23 DIAGNOSIS — N390 Urinary tract infection, site not specified: Secondary | ICD-10-CM | POA: Diagnosis not present

## 2022-05-23 DIAGNOSIS — D869 Sarcoidosis, unspecified: Secondary | ICD-10-CM | POA: Diagnosis not present

## 2022-05-23 DIAGNOSIS — G9341 Metabolic encephalopathy: Secondary | ICD-10-CM | POA: Diagnosis not present

## 2022-05-23 DIAGNOSIS — E785 Hyperlipidemia, unspecified: Secondary | ICD-10-CM | POA: Diagnosis not present

## 2022-05-23 NOTE — Progress Notes (Deleted)
   Tara [name] 09/13/1950 [id number]   History:  71 y.o. G0 presents for annual exam without GYN complaints. Postmenopausal - no HRT, no bleeding. Normal pap and mammogram history. Unremarkable labial ultrasound 05/2020 for right labial enlargement/asymmetry, asymptomatic. History of sarcoidosis, anemia.   Gynecologic History No LMP recorded. Patient is postmenopausal.   Contraception: post menopausal status Sexually active: No  Health Maintenance Last Pap: 05/18/2020. Results were: Normal, 3-year repeat Last mammogram: 11/02/2021. Results were: Normal Last colonoscopy: 11/22/2021 Results were: Tubular adenoma, 3-year recall Last Dexa: 06/10/2018. Results were: Normal  Past medical history, past surgical history, family history and social history were all reviewed and documented in the EPIC chart. 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   ROS:  A ROS was performed and pertinent positives and negatives are included.  Exam:  There were no vitals filed for this visit.  There is no height or weight on file to calculate BMI.  General appearance:  Normal Thyroid:  Symmetrical, normal in size, without palpable masses or nodularity. Respiratory  Auscultation:  Clear without wheezing or rhonchi Cardiovascular  Auscultation:  Regular rate, without rubs, murmurs or gallops  Edema/varicosities:  Not grossly evident Abdominal  Soft,nontender, without masses, guarding or rebound.  Liver/spleen:  No organomegaly noted  Hernia:  None appreciated  Skin  Inspection:  Grossly normal Breasts: Examined lying and sitting.   Right: Without masses, retractions, nipple discharge or axillary adenopathy.   Left: Without masses, retractions, nipple discharge or axillary adenopathy. Genitourinary   Inguinal/mons:  Normal without inguinal adenopathy  External genitalia:  Right labia majora is symmetrically and significantly larger than left, no tenderness, soft  BUS/Urethra/Skene's glands:   Normal  Vagina:  Normal appearing with normal color and discharge, no lesions  Cervix:  Normal appearing without discharge or lesions  Uterus:  Difficult to palpate due to body habitus but no gross masses or tenderness  Adnexa/parametria:     Rt: Normal in size, without masses or tenderness.   Lt: Normal in size, without masses or tenderness.  Anus and perineum: Normal  Digital rectal exam: Normal sphincter tone without palpated masses or tenderness  Patient informed chaperone available to be present for breast and pelvic exam. Patient has requested no chaperone to be present. Patient has been advised what will be completed during breast and pelvic exam.   Assessment/Plan:  71 y.o. G0 for annual exam.   Well female exam with routine gynecological exam - Education provided on SBEs, importance of preventative screenings, current guidelines, high calcium diet, regular exercise, and multivitamin daily. Labs with PCP.  Postmenopausal - no HRT, no bleeding.  Screening for cervical cancer - Normal Pap history.  If she prefers to continue screenings we will repeat at 3-year interval.  Screening for breast cancer - Normal mammogram history.  Overdue and encouraged to schedule soon.  Normal breast exam today.  Screening for colon cancer - 2020 colonoscopy - tubular adenomas. Will repeat in 3 years at GI's recommended interval.   Screening for osteoporosis - Normal bone density in 2019. Will repeat at 5-year interval per recommendation.  Return in 1 year for annual exam.    Tamela Gammon DNP, 2:05 PM 05/23/2022

## 2022-05-24 DIAGNOSIS — F4320 Adjustment disorder, unspecified: Secondary | ICD-10-CM | POA: Diagnosis not present

## 2022-05-24 DIAGNOSIS — E785 Hyperlipidemia, unspecified: Secondary | ICD-10-CM | POA: Diagnosis not present

## 2022-05-24 DIAGNOSIS — F330 Major depressive disorder, recurrent, mild: Secondary | ICD-10-CM | POA: Diagnosis not present

## 2022-05-24 DIAGNOSIS — G2401 Drug induced subacute dyskinesia: Secondary | ICD-10-CM | POA: Diagnosis not present

## 2022-05-24 DIAGNOSIS — I4891 Unspecified atrial fibrillation: Secondary | ICD-10-CM | POA: Diagnosis not present

## 2022-05-24 DIAGNOSIS — F411 Generalized anxiety disorder: Secondary | ICD-10-CM | POA: Diagnosis not present

## 2022-05-24 DIAGNOSIS — I10 Essential (primary) hypertension: Secondary | ICD-10-CM | POA: Diagnosis not present

## 2022-05-25 ENCOUNTER — Encounter: Payer: Self-pay | Admitting: Podiatry

## 2022-05-25 ENCOUNTER — Ambulatory Visit (INDEPENDENT_AMBULATORY_CARE_PROVIDER_SITE_OTHER): Payer: BC Managed Care – PPO | Admitting: Podiatry

## 2022-05-25 DIAGNOSIS — E1169 Type 2 diabetes mellitus with other specified complication: Secondary | ICD-10-CM | POA: Diagnosis not present

## 2022-05-25 DIAGNOSIS — B351 Tinea unguium: Secondary | ICD-10-CM | POA: Diagnosis not present

## 2022-05-25 DIAGNOSIS — E1151 Type 2 diabetes mellitus with diabetic peripheral angiopathy without gangrene: Secondary | ICD-10-CM | POA: Diagnosis not present

## 2022-05-25 NOTE — Progress Notes (Signed)
This patient returns to my office for at risk foot care.  This patient requires this care by a professional since this patient will be at risk due to having diabetes and kidney disease. This patient is unable to cut nails herself since the patient cannot reach her nails.These nails are painful walking and wearing shoes. She presents to the office in a wheelchair. This patient presents for at risk foot care today.  General Appearance  Alert, conversant and in no acute stress.  Vascular  Dorsalis pedis and pulses are palpable  bilaterally.  Posterior tibial pulses are weakly palpable. Capillary return is within normal limits  bilaterally. Temperature is within normal limits  bilaterally.  Neurologic  Senn-Weinstein monofilament wire test within normal limits  bilaterally. Muscle power within normal limits bilaterally.  Nails Thick disfigured discolored nails with subungual debris  from hallux to fifth toes bilaterally. No evidence of bacterial infection or drainage bilaterally.  Orthopedic  No limitations of motion  feet .  No crepitus or effusions noted.  Mild  hallux Varus  B/L.  Hammer toes 2-4  B/L.  Skin  normotropic skin with no porokeratosis noted bilaterally.  No signs of infections or ulcers noted.   Asymptomatic callus sub 5th met left foot.  Onychomycosis  Pain in right toes  Pain in left toes  Consent was obtained for treatment procedures.   Mechanical debridement of nails 1-5  bilaterally performed with a nail nipper.  Filed with dremel without incident.    Return office visit    4 months                  Told patient to return for periodic foot care and evaluation due to potential at risk complications.   Gardiner Barefoot DPM

## 2022-05-30 DIAGNOSIS — N390 Urinary tract infection, site not specified: Secondary | ICD-10-CM | POA: Diagnosis not present

## 2022-05-30 DIAGNOSIS — G9341 Metabolic encephalopathy: Secondary | ICD-10-CM | POA: Diagnosis not present

## 2022-05-30 DIAGNOSIS — E785 Hyperlipidemia, unspecified: Secondary | ICD-10-CM | POA: Diagnosis not present

## 2022-05-30 DIAGNOSIS — D869 Sarcoidosis, unspecified: Secondary | ICD-10-CM | POA: Diagnosis not present

## 2022-06-03 DIAGNOSIS — I4891 Unspecified atrial fibrillation: Secondary | ICD-10-CM | POA: Diagnosis not present

## 2022-06-03 DIAGNOSIS — I10 Essential (primary) hypertension: Secondary | ICD-10-CM | POA: Diagnosis not present

## 2022-06-03 DIAGNOSIS — F411 Generalized anxiety disorder: Secondary | ICD-10-CM | POA: Diagnosis not present

## 2022-06-04 ENCOUNTER — Emergency Department (HOSPITAL_COMMUNITY): Payer: BC Managed Care – PPO

## 2022-06-04 ENCOUNTER — Inpatient Hospital Stay (HOSPITAL_COMMUNITY)
Admission: EM | Admit: 2022-06-04 | Discharge: 2022-07-03 | DRG: 871 | Disposition: A | Discharge status: Skilled Nursing Facility | Payer: BC Managed Care – PPO | Source: Skilled Nursing Facility | Attending: Internal Medicine | Admitting: Internal Medicine

## 2022-06-04 ENCOUNTER — Encounter (HOSPITAL_COMMUNITY): Payer: Self-pay | Admitting: Emergency Medicine

## 2022-06-04 ENCOUNTER — Other Ambulatory Visit: Payer: Self-pay

## 2022-06-04 DIAGNOSIS — R410 Disorientation, unspecified: Secondary | ICD-10-CM | POA: Diagnosis not present

## 2022-06-04 DIAGNOSIS — N189 Chronic kidney disease, unspecified: Secondary | ICD-10-CM | POA: Diagnosis present

## 2022-06-04 DIAGNOSIS — K7200 Acute and subacute hepatic failure without coma: Secondary | ICD-10-CM | POA: Diagnosis present

## 2022-06-04 DIAGNOSIS — Z8249 Family history of ischemic heart disease and other diseases of the circulatory system: Secondary | ICD-10-CM

## 2022-06-04 DIAGNOSIS — M1712 Unilateral primary osteoarthritis, left knee: Secondary | ICD-10-CM | POA: Diagnosis not present

## 2022-06-04 DIAGNOSIS — D8689 Sarcoidosis of other sites: Secondary | ICD-10-CM | POA: Diagnosis present

## 2022-06-04 DIAGNOSIS — R64 Cachexia: Secondary | ICD-10-CM | POA: Diagnosis present

## 2022-06-04 DIAGNOSIS — G500 Trigeminal neuralgia: Secondary | ICD-10-CM | POA: Diagnosis present

## 2022-06-04 DIAGNOSIS — Z515 Encounter for palliative care: Secondary | ICD-10-CM | POA: Diagnosis not present

## 2022-06-04 DIAGNOSIS — F419 Anxiety disorder, unspecified: Secondary | ICD-10-CM | POA: Diagnosis present

## 2022-06-04 DIAGNOSIS — R41841 Cognitive communication deficit: Secondary | ICD-10-CM | POA: Diagnosis not present

## 2022-06-04 DIAGNOSIS — R1314 Dysphagia, pharyngoesophageal phase: Secondary | ICD-10-CM | POA: Diagnosis not present

## 2022-06-04 DIAGNOSIS — J9601 Acute respiratory failure with hypoxia: Secondary | ICD-10-CM | POA: Diagnosis not present

## 2022-06-04 DIAGNOSIS — N179 Acute kidney failure, unspecified: Secondary | ICD-10-CM | POA: Diagnosis not present

## 2022-06-04 DIAGNOSIS — M5023 Other cervical disc displacement, cervicothoracic region: Secondary | ICD-10-CM | POA: Diagnosis not present

## 2022-06-04 DIAGNOSIS — M6281 Muscle weakness (generalized): Secondary | ICD-10-CM | POA: Diagnosis not present

## 2022-06-04 DIAGNOSIS — I4819 Other persistent atrial fibrillation: Secondary | ICD-10-CM | POA: Diagnosis not present

## 2022-06-04 DIAGNOSIS — R0603 Acute respiratory distress: Secondary | ICD-10-CM

## 2022-06-04 DIAGNOSIS — I2510 Atherosclerotic heart disease of native coronary artery without angina pectoris: Secondary | ICD-10-CM | POA: Diagnosis present

## 2022-06-04 DIAGNOSIS — G4733 Obstructive sleep apnea (adult) (pediatric): Secondary | ICD-10-CM | POA: Diagnosis not present

## 2022-06-04 DIAGNOSIS — R0689 Other abnormalities of breathing: Secondary | ICD-10-CM | POA: Diagnosis not present

## 2022-06-04 DIAGNOSIS — R7989 Other specified abnormal findings of blood chemistry: Secondary | ICD-10-CM | POA: Diagnosis not present

## 2022-06-04 DIAGNOSIS — R918 Other nonspecific abnormal finding of lung field: Secondary | ICD-10-CM | POA: Diagnosis not present

## 2022-06-04 DIAGNOSIS — E860 Dehydration: Secondary | ICD-10-CM | POA: Diagnosis present

## 2022-06-04 DIAGNOSIS — A419 Sepsis, unspecified organism: Principal | ICD-10-CM | POA: Diagnosis present

## 2022-06-04 DIAGNOSIS — Z823 Family history of stroke: Secondary | ICD-10-CM

## 2022-06-04 DIAGNOSIS — F32A Depression, unspecified: Secondary | ICD-10-CM | POA: Diagnosis present

## 2022-06-04 DIAGNOSIS — Z4682 Encounter for fitting and adjustment of non-vascular catheter: Secondary | ICD-10-CM | POA: Diagnosis not present

## 2022-06-04 DIAGNOSIS — I2489 Other forms of acute ischemic heart disease: Secondary | ICD-10-CM | POA: Diagnosis not present

## 2022-06-04 DIAGNOSIS — R0682 Tachypnea, not elsewhere classified: Secondary | ICD-10-CM | POA: Diagnosis not present

## 2022-06-04 DIAGNOSIS — K5730 Diverticulosis of large intestine without perforation or abscess without bleeding: Secondary | ICD-10-CM | POA: Diagnosis not present

## 2022-06-04 DIAGNOSIS — N1831 Chronic kidney disease, stage 3a: Secondary | ICD-10-CM | POA: Diagnosis present

## 2022-06-04 DIAGNOSIS — R1312 Dysphagia, oropharyngeal phase: Secondary | ICD-10-CM | POA: Diagnosis not present

## 2022-06-04 DIAGNOSIS — I480 Paroxysmal atrial fibrillation: Secondary | ICD-10-CM | POA: Insufficient documentation

## 2022-06-04 DIAGNOSIS — J189 Pneumonia, unspecified organism: Secondary | ICD-10-CM | POA: Diagnosis not present

## 2022-06-04 DIAGNOSIS — Z79899 Other long term (current) drug therapy: Secondary | ICD-10-CM

## 2022-06-04 DIAGNOSIS — E872 Acidosis, unspecified: Secondary | ICD-10-CM | POA: Diagnosis not present

## 2022-06-04 DIAGNOSIS — R627 Adult failure to thrive: Secondary | ICD-10-CM | POA: Diagnosis present

## 2022-06-04 DIAGNOSIS — E876 Hypokalemia: Secondary | ICD-10-CM | POA: Diagnosis present

## 2022-06-04 DIAGNOSIS — R6520 Severe sepsis without septic shock: Secondary | ICD-10-CM | POA: Diagnosis present

## 2022-06-04 DIAGNOSIS — Z833 Family history of diabetes mellitus: Secondary | ICD-10-CM

## 2022-06-04 DIAGNOSIS — I4891 Unspecified atrial fibrillation: Secondary | ICD-10-CM | POA: Diagnosis not present

## 2022-06-04 DIAGNOSIS — D509 Iron deficiency anemia, unspecified: Secondary | ICD-10-CM | POA: Diagnosis not present

## 2022-06-04 DIAGNOSIS — Z7401 Bed confinement status: Secondary | ICD-10-CM | POA: Diagnosis not present

## 2022-06-04 DIAGNOSIS — R404 Transient alteration of awareness: Secondary | ICD-10-CM | POA: Diagnosis not present

## 2022-06-04 DIAGNOSIS — I129 Hypertensive chronic kidney disease with stage 1 through stage 4 chronic kidney disease, or unspecified chronic kidney disease: Secondary | ICD-10-CM | POA: Diagnosis not present

## 2022-06-04 DIAGNOSIS — G2401 Drug induced subacute dyskinesia: Secondary | ICD-10-CM | POA: Diagnosis not present

## 2022-06-04 DIAGNOSIS — Z66 Do not resuscitate: Secondary | ICD-10-CM | POA: Diagnosis present

## 2022-06-04 DIAGNOSIS — Z0389 Encounter for observation for other suspected diseases and conditions ruled out: Secondary | ICD-10-CM | POA: Diagnosis not present

## 2022-06-04 DIAGNOSIS — G629 Polyneuropathy, unspecified: Secondary | ICD-10-CM | POA: Diagnosis present

## 2022-06-04 DIAGNOSIS — R0602 Shortness of breath: Secondary | ICD-10-CM | POA: Diagnosis not present

## 2022-06-04 DIAGNOSIS — M5030 Other cervical disc degeneration, unspecified cervical region: Secondary | ICD-10-CM | POA: Diagnosis not present

## 2022-06-04 DIAGNOSIS — K828 Other specified diseases of gallbladder: Secondary | ICD-10-CM | POA: Diagnosis not present

## 2022-06-04 DIAGNOSIS — R634 Abnormal weight loss: Secondary | ICD-10-CM | POA: Diagnosis not present

## 2022-06-04 DIAGNOSIS — R509 Fever, unspecified: Secondary | ICD-10-CM | POA: Diagnosis not present

## 2022-06-04 DIAGNOSIS — R778 Other specified abnormalities of plasma proteins: Secondary | ICD-10-CM | POA: Diagnosis not present

## 2022-06-04 DIAGNOSIS — Z20822 Contact with and (suspected) exposure to covid-19: Secondary | ICD-10-CM | POA: Diagnosis present

## 2022-06-04 DIAGNOSIS — G527 Disorders of multiple cranial nerves: Secondary | ICD-10-CM | POA: Diagnosis not present

## 2022-06-04 DIAGNOSIS — Z7901 Long term (current) use of anticoagulants: Secondary | ICD-10-CM

## 2022-06-04 DIAGNOSIS — I10 Essential (primary) hypertension: Secondary | ICD-10-CM | POA: Diagnosis present

## 2022-06-04 DIAGNOSIS — Z96652 Presence of left artificial knee joint: Secondary | ICD-10-CM | POA: Diagnosis present

## 2022-06-04 DIAGNOSIS — R1310 Dysphagia, unspecified: Secondary | ICD-10-CM | POA: Diagnosis not present

## 2022-06-04 DIAGNOSIS — I700 Atherosclerosis of aorta: Secondary | ICD-10-CM | POA: Diagnosis not present

## 2022-06-04 DIAGNOSIS — R519 Headache, unspecified: Secondary | ICD-10-CM | POA: Diagnosis not present

## 2022-06-04 DIAGNOSIS — I517 Cardiomegaly: Secondary | ICD-10-CM | POA: Diagnosis not present

## 2022-06-04 DIAGNOSIS — J690 Pneumonitis due to inhalation of food and vomit: Secondary | ICD-10-CM | POA: Diagnosis not present

## 2022-06-04 DIAGNOSIS — Z6827 Body mass index (BMI) 27.0-27.9, adult: Secondary | ICD-10-CM

## 2022-06-04 DIAGNOSIS — N3001 Acute cystitis with hematuria: Secondary | ICD-10-CM | POA: Diagnosis not present

## 2022-06-04 DIAGNOSIS — Z825 Family history of asthma and other chronic lower respiratory diseases: Secondary | ICD-10-CM

## 2022-06-04 DIAGNOSIS — E785 Hyperlipidemia, unspecified: Secondary | ICD-10-CM | POA: Diagnosis not present

## 2022-06-04 DIAGNOSIS — R4689 Other symptoms and signs involving appearance and behavior: Secondary | ICD-10-CM | POA: Diagnosis not present

## 2022-06-04 DIAGNOSIS — D631 Anemia in chronic kidney disease: Secondary | ICD-10-CM | POA: Diagnosis not present

## 2022-06-04 DIAGNOSIS — R4189 Other symptoms and signs involving cognitive functions and awareness: Secondary | ICD-10-CM | POA: Diagnosis not present

## 2022-06-04 DIAGNOSIS — E870 Hyperosmolality and hypernatremia: Secondary | ICD-10-CM | POA: Diagnosis not present

## 2022-06-04 DIAGNOSIS — T68XXXA Hypothermia, initial encounter: Secondary | ICD-10-CM | POA: Diagnosis not present

## 2022-06-04 DIAGNOSIS — D869 Sarcoidosis, unspecified: Secondary | ICD-10-CM | POA: Diagnosis not present

## 2022-06-04 DIAGNOSIS — R000 Tachycardia, unspecified: Secondary | ICD-10-CM | POA: Diagnosis not present

## 2022-06-04 DIAGNOSIS — H532 Diplopia: Secondary | ICD-10-CM | POA: Diagnosis not present

## 2022-06-04 DIAGNOSIS — K6289 Other specified diseases of anus and rectum: Secondary | ICD-10-CM | POA: Diagnosis not present

## 2022-06-04 DIAGNOSIS — I679 Cerebrovascular disease, unspecified: Secondary | ICD-10-CM | POA: Diagnosis not present

## 2022-06-04 DIAGNOSIS — R2681 Unsteadiness on feet: Secondary | ICD-10-CM | POA: Diagnosis not present

## 2022-06-04 LAB — I-STAT VENOUS BLOOD GAS, ED
Acid-Base Excess: 4 mmol/L — ABNORMAL HIGH (ref 0.0–2.0)
Bicarbonate: 27.4 mmol/L (ref 20.0–28.0)
Calcium, Ion: 1.2 mmol/L (ref 1.15–1.40)
HCT: 49 % — ABNORMAL HIGH (ref 36.0–46.0)
Hemoglobin: 16.7 g/dL — ABNORMAL HIGH (ref 12.0–15.0)
O2 Saturation: 87 %
Potassium: 4.2 mmol/L (ref 3.5–5.1)
Sodium: 145 mmol/L (ref 135–145)
TCO2: 29 mmol/L (ref 22–32)
pCO2, Ven: 37.3 mmHg — ABNORMAL LOW (ref 44–60)
pH, Ven: 7.474 — ABNORMAL HIGH (ref 7.25–7.43)
pO2, Ven: 49 mmHg — ABNORMAL HIGH (ref 32–45)

## 2022-06-04 LAB — CBC WITH DIFFERENTIAL/PLATELET
Abs Immature Granulocytes: 0.11 10*3/uL — ABNORMAL HIGH (ref 0.00–0.07)
Basophils Absolute: 0.1 10*3/uL (ref 0.0–0.1)
Basophils Relative: 0 %
Eosinophils Absolute: 0 10*3/uL (ref 0.0–0.5)
Eosinophils Relative: 0 %
HCT: 51.3 % — ABNORMAL HIGH (ref 36.0–46.0)
Hemoglobin: 16.7 g/dL — ABNORMAL HIGH (ref 12.0–15.0)
Immature Granulocytes: 1 %
Lymphocytes Relative: 5 %
Lymphs Abs: 1 10*3/uL (ref 0.7–4.0)
MCH: 28.3 pg (ref 26.0–34.0)
MCHC: 32.6 g/dL (ref 30.0–36.0)
MCV: 86.8 fL (ref 80.0–100.0)
Monocytes Absolute: 1.3 10*3/uL — ABNORMAL HIGH (ref 0.1–1.0)
Monocytes Relative: 6 %
Neutro Abs: 19.7 10*3/uL — ABNORMAL HIGH (ref 1.7–7.7)
Neutrophils Relative %: 88 %
Platelets: 345 10*3/uL (ref 150–400)
RBC: 5.91 MIL/uL — ABNORMAL HIGH (ref 3.87–5.11)
RDW: 14.7 % (ref 11.5–15.5)
WBC: 22.2 10*3/uL — ABNORMAL HIGH (ref 4.0–10.5)
nRBC: 0 % (ref 0.0–0.2)

## 2022-06-04 LAB — URINALYSIS, ROUTINE W REFLEX MICROSCOPIC
Bilirubin Urine: NEGATIVE
Glucose, UA: NEGATIVE mg/dL
Hgb urine dipstick: NEGATIVE
Ketones, ur: NEGATIVE mg/dL
Nitrite: POSITIVE — AB
Protein, ur: 30 mg/dL — AB
Specific Gravity, Urine: 1.015 (ref 1.005–1.030)
pH: 5 (ref 5.0–8.0)

## 2022-06-04 LAB — RESP PANEL BY RT-PCR (FLU A&B, COVID) ARPGX2
Influenza A by PCR: NEGATIVE
Influenza B by PCR: NEGATIVE
SARS Coronavirus 2 by RT PCR: NEGATIVE

## 2022-06-04 LAB — COMPREHENSIVE METABOLIC PANEL
ALT: 58 U/L — ABNORMAL HIGH (ref 0–44)
AST: 61 U/L — ABNORMAL HIGH (ref 15–41)
Albumin: 3.2 g/dL — ABNORMAL LOW (ref 3.5–5.0)
Alkaline Phosphatase: 74 U/L (ref 38–126)
Anion gap: 16 — ABNORMAL HIGH (ref 5–15)
BUN: 30 mg/dL — ABNORMAL HIGH (ref 8–23)
CO2: 25 mmol/L (ref 22–32)
Calcium: 9.9 mg/dL (ref 8.9–10.3)
Chloride: 108 mmol/L (ref 98–111)
Creatinine, Ser: 1.86 mg/dL — ABNORMAL HIGH (ref 0.44–1.00)
GFR, Estimated: 29 mL/min — ABNORMAL LOW (ref >60)
Glucose, Bld: 116 mg/dL — ABNORMAL HIGH (ref 70–99)
Potassium: 3.5 mmol/L (ref 3.5–5.1)
Sodium: 149 mmol/L — ABNORMAL HIGH (ref 135–145)
Total Bilirubin: 1 mg/dL (ref 0.3–1.2)
Total Protein: 6.3 g/dL — ABNORMAL LOW (ref 6.5–8.1)

## 2022-06-04 LAB — LACTIC ACID, PLASMA
Lactic Acid, Venous: 2.2 mmol/L (ref 0.5–1.9)
Lactic Acid, Venous: 2.3 mmol/L (ref 0.5–1.9)

## 2022-06-04 LAB — TROPONIN I (HIGH SENSITIVITY): Troponin I (High Sensitivity): 197 ng/L (ref <18)

## 2022-06-04 LAB — PROTIME-INR
INR: 1.6 — ABNORMAL HIGH (ref 0.8–1.2)
Prothrombin Time: 19 seconds — ABNORMAL HIGH (ref 11.4–15.2)

## 2022-06-04 LAB — APTT: aPTT: 34 seconds (ref 24–36)

## 2022-06-04 LAB — T4, FREE: Free T4: 1.03 ng/dL (ref 0.61–1.12)

## 2022-06-04 LAB — AMMONIA: Ammonia: 33 umol/L (ref 9–35)

## 2022-06-04 LAB — CBG MONITORING, ED: Glucose-Capillary: 146 mg/dL — ABNORMAL HIGH (ref 70–99)

## 2022-06-04 LAB — BRAIN NATRIURETIC PEPTIDE: B Natriuretic Peptide: 124.6 pg/mL — ABNORMAL HIGH (ref 0.0–100.0)

## 2022-06-04 MED ORDER — VANCOMYCIN HCL 1500 MG/300ML IV SOLN
1500.0000 mg | Freq: Once | INTRAVENOUS | Status: AC
  Administered 2022-06-05: 1500 mg via INTRAVENOUS
  Filled 2022-06-04: qty 300

## 2022-06-04 MED ORDER — SODIUM CHLORIDE 0.9 % IV SOLN
2.0000 g | INTRAVENOUS | Status: DC
  Administered 2022-06-04: 2 g via INTRAVENOUS
  Filled 2022-06-04: qty 20

## 2022-06-04 MED ORDER — SODIUM CHLORIDE 0.9 % IV SOLN: 2.0000 g | INTRAVENOUS | Status: DC

## 2022-06-04 MED ORDER — ENOXAPARIN SODIUM 40 MG/0.4ML IJ SOSY: 40.0000 mg | PREFILLED_SYRINGE | INTRAMUSCULAR | Status: DC

## 2022-06-04 MED ORDER — ASPIRIN 300 MG RE SUPP
300.0000 mg | Freq: Once | RECTAL | Status: AC
  Administered 2022-06-04: 300 mg via RECTAL
  Filled 2022-06-04: qty 1

## 2022-06-04 MED ORDER — SENNOSIDES-DOCUSATE SODIUM 8.6-50 MG PO TABS: 1.0000 | ORAL_TABLET | Freq: Every evening | ORAL | Status: DC | PRN

## 2022-06-04 MED ORDER — SODIUM CHLORIDE 0.9 % IV SOLN
500.0000 mg | INTRAVENOUS | Status: DC
  Administered 2022-06-04: 500 mg via INTRAVENOUS
  Filled 2022-06-04: qty 5

## 2022-06-04 MED ORDER — METOPROLOL TARTRATE 5 MG/5ML IV SOLN
5.0000 mg | Freq: Four times a day (QID) | INTRAVENOUS | Status: DC
  Administered 2022-06-05 – 2022-06-12 (×29): 5 mg via INTRAVENOUS
  Filled 2022-06-04 (×29): qty 5

## 2022-06-04 MED ORDER — ACETAMINOPHEN 650 MG RE SUPP
650.0000 mg | Freq: Four times a day (QID) | RECTAL | Status: DC | PRN
  Administered 2022-06-17: 650 mg via RECTAL
  Filled 2022-06-04: qty 1

## 2022-06-04 MED ORDER — ASPIRIN 81 MG PO CHEW: 324.0000 mg | CHEWABLE_TABLET | Freq: Once | ORAL | Status: DC

## 2022-06-04 MED ORDER — LACTATED RINGERS IV SOLN: INTRAVENOUS | Status: AC

## 2022-06-04 MED ORDER — ACETAMINOPHEN 325 MG PO TABS
650.0000 mg | ORAL_TABLET | Freq: Four times a day (QID) | ORAL | Status: DC | PRN
  Administered 2022-06-09 – 2022-06-21 (×11): 650 mg via ORAL
  Filled 2022-06-04 (×11): qty 2

## 2022-06-04 MED ORDER — ISOSORBIDE MONONITRATE ER 30 MG PO TB24: 30.0000 mg | ORAL_TABLET | Freq: Every day | ORAL | Status: DC

## 2022-06-04 MED ORDER — SODIUM CHLORIDE 0.9 % IV SOLN
2.0000 g | INTRAVENOUS | Status: DC
  Administered 2022-06-05 (×2): 2 g via INTRAVENOUS
  Filled 2022-06-04 (×2): qty 12.5

## 2022-06-04 MED ORDER — LACTATED RINGERS IV BOLUS
1000.0000 mL | Freq: Once | INTRAVENOUS | Status: AC
  Administered 2022-06-04: 1000 mL via INTRAVENOUS

## 2022-06-04 MED ORDER — DILTIAZEM HCL ER COATED BEADS 240 MG PO CP24: 240.0000 mg | ORAL_CAPSULE | Freq: Every day | ORAL | Status: DC

## 2022-06-04 MED ORDER — LACTATED RINGERS IV SOLN: INTRAVENOUS | Status: DC

## 2022-06-04 MED ORDER — ACETAMINOPHEN 650 MG RE SUPP
650.0000 mg | Freq: Once | RECTAL | Status: AC
  Administered 2022-06-04: 650 mg via RECTAL
  Filled 2022-06-04: qty 1

## 2022-06-04 MED ORDER — APIXABAN 5 MG PO TABS: 5.0000 mg | ORAL_TABLET | Freq: Two times a day (BID) | ORAL | Status: DC

## 2022-06-04 MED ORDER — SODIUM CHLORIDE 0.9 % IV SOLN: 500.0000 mg | INTRAVENOUS | Status: DC

## 2022-06-04 MED ORDER — VANCOMYCIN HCL 750 MG/150ML IV SOLN: 750.0000 mg | INTRAVENOUS | Status: DC

## 2022-06-04 MED ORDER — METOPROLOL TARTRATE 5 MG/5ML IV SOLN: 5.0000 mg | Freq: Four times a day (QID) | INTRAVENOUS | Status: DC | PRN

## 2022-06-04 MED ORDER — ACETAMINOPHEN 325 MG PO TABS: 650.0000 mg | ORAL_TABLET | Freq: Once | ORAL | Status: DC

## 2022-06-04 NOTE — ED Notes (Signed)
Geriatric care manager calling with additional information. Patient has had rapid decline in ambulation, speech, continence, as well as difficulties with eating and drinking. Pt states that she was A&O x4 with no deficits 8 weeks ago. She was admitted to [facility] after being diagnosed with a UTI.

## 2022-06-04 NOTE — Progress Notes (Signed)
Pharmacy Antibiotic Note  Tara [name] is a 71 y.o. female admitted on 06/04/2022 with sepsis possible PNA vs urinary source.  Pharmacy has been consulted for vancomycin and cefepime dosing. CXR shows airspace opacity at both lung bases c/f PNA vs asp pneumonitis WBC 22.2/LA 2.3/Tmax 100.5 Scr 1.86 (baseline 1.1-1.3)  Plan: Cefepime 2g IV q24h Vancomycin '1500mg'$  IV x1 followed by vancomycin 750 q24h (goal trough 15-69mg/mL) F/u renal func and recovery, culture data, LOT  Height: '5\' 6"'$  (167.6 cm) Weight: 77.1 kg (170 lb) IBW/kg (Calculated) : 59.3  Temp (24hrs), Avg:101.3 F (38.5 C), Min:100.5 F (38.1 C), Max:102 F (38.9 C)  Recent Labs  Lab 06/04/22 1703 06/04/22 1830 06/04/22 1853  WBC 22.2*  --   --   CREATININE  --  1.86*  --   LATICACIDVEN 2.2*  --  2.3*    Estimated Creatinine Clearance: 29.9 mL/min (A) (by C-G formula based on SCr of 1.86 mg/dL (H)).    Allergies  Allergen Reactions   Fluoxetine Hcl Other (See Comments)    (PROZAC) Suicidal thoughts   Sulfa Antibiotics Rash   Chocolate Other (See Comments)    SOMETIMES CAUSES SEVERE HEADACHES   Floxin [Ocuflox] Anxiety    shaky   Other Nausea And Vomiting    INSTANT ICED TEA PACKETS   Sulfonamide Derivatives Rash    Antimicrobials this admission: Vanc 11/20> Cefepime 11/20>  Dose adjustments this admission:   Microbiology results: 11/20 BCx:  11/20 RVP: 11/20 MRSA PCR:   Thank you for allowing pharmacy to be a part of this patient's care.  MWilson Singer PharmD Clinical Pharmacist 06/04/2022 11:25 PM

## 2022-06-04 NOTE — H&P (Signed)
PCP:   Biagio Borg, MD   Chief Complaint: Altered mentation   HPI: This is a 71 year old female nursing home resident with past medical history of CKD, depression, sarcoid, dyslipidemia, and hypertension.  Today the patient was noted to have decreased level of consciousness, 911 was called.   Patient brought to the ER, patient hypoxic into the 80s on room air.  Chest x-ray shows pneumonia.  She is currently on 4 L oxygen sats in the high 90s.  Patient remains lethargic and poorly responsive.  Patient does open her eyes when stimulated, she follows instructions moving extremities once asked.  She however will not talk and provides no history.  Patient is hemodynamically stable.   Review of Systems:  Unable to obtain due to acuity of patient's condition  Past Medical History: Past Medical History:  Diagnosis Date   Anemia    on meds   ANKLE PAIN, LEFT 04/01/2008   ANXIETY 04/17/2007   on meds   Arthritis    generalized   Cataract    bilateral sx   Chronic kidney disease    stage 3   Colon polyp    COLONIC POLYPS, HX OF 08/01/2007   CONTUSIONS, MULTIPLE 04/01/2009   DEPRESSION 04/17/2007   on meds   DIZZINESS 08/01/2007   DYSPNEA 08/01/2007   with exertion   Enlargement of lymph nodes 08/13/2007   Excessive involuntary blinking    per pt,going on since 2018   Glaucoma    on meds   GLUCOSE INTOLERANCE 08/01/2007   Hypercalcemia due to sarcoidosis 2014   HYPERLIPIDEMIA 08/01/2007   no meds   HYPERSOMNIA 07/28/2008   HYPERTENSION 04/17/2007   Impaired glucose tolerance 03/23/2011   JOINT EFFUSION, LEFT KNEE 06/02/2010   Loose body in knee 04/01/2009   pt not sure?   Metabolic encephalopathy 43/15/[id number]/[id number]   Migraines    "stopped 3-4 yr ago" (07/23/2012)   Morbid obesity ([place]) 04/20/2007   OTHER DISEASES OF LUNG NOT ELSEWHERE CLASSIFIED 08/01/2007   Pain in joint, lower leg 04/01/2009   PERIPHERAL EDEMA 04/21/2009   Pre-diabetes    Sarcoidosis 09/25/2007    Seasonal allergies    SHOULDER PAIN, LEFT 04/01/2009   Sleep apnea    does not use Cpap   Past Surgical History:  Procedure Laterality Date   BREAST SURGERY     Biopsy benign/bil breaST   CARDIOVERSION N/A 12/15/2021   Procedure: CARDIOVERSION;  Surgeon: Skeet Latch, MD;  Location: [facility];  Service: Cardiovascular;  Laterality: N/A;   CARDIOVERSION N/A 02/09/2022   Procedure: CARDIOVERSION;  Surgeon: [name], MD;  Location: [facility];  Service: Cardiovascular;  Laterality: N/A;   CATARACT EXTRACTION     RIGHT EYE   COLONOSCOPY  2020   KN-MAC-suprep(good)-tics/TA's   COMBINED MEDIASTINOSCOPY AND BRONCHOSCOPY  08/2007   COMBINED MEDIASTINOSCOPY AND BRONCHOSCOPY  2009   Dental implant     FRACTURE SURGERY  ?02/1997   "left upper arm; put rod in" (07/23/2012)   GUM SURGERY  2000-?2009   "several ORs; soft tissue graft; took material from roof of mouth" (07/23/2012   HUMERUS SURGERY Left 1998   rod insertion   KNEE ARTHROSCOPY  03/2004; 06/2009   "right; left" Dr. Theda Sers   KNEE SURGERY  2012   ARTHROSCOPIC LEFT KNEE   LYMPH NODE BIOPSY  ~ 2009   "for sarcoidosis; don't know exactly which nodes" (07/23/2102)   [place]   Open   POLYPECTOMY  2020   TA's  REFRACTIVE SURGERY  08/1998   "both eyes" (07/23/2012)   [place]   Bil   TOTAL KNEE ARTHROPLASTY Left 09/24/2016   Procedure: TOTAL KNEE ARTHROPLASTY;  Surgeon: Dorna Leitz, MD;  Location: [facility];  Service: Orthopedics;  Laterality: Left;    Medications: Prior to Admission medications   Medication Sig Start Date End Date Taking? Authorizing Provider  amoxicillin (AMOXIL) 500 MG tablet Take 2,000 mg by mouth once. Prior to dental appointments 08/03/19   [provider]  antiseptic oral rinse (BIOTENE) LIQD 15 mLs by Mouth Rinse route 3 (three) times daily as needed for dry mouth or mouth pain.    [provider]  apixaban (ELIQUIS) 5 MG TABS tablet TAKE 1 TABLET(5 MG) BY  MOUTH TWICE DAILY 05/10/22   Janina Mayo, MD  baclofen (LIORESAL) 10 MG tablet Take 5 mg by mouth 2 (two) times daily as needed (Headache). 09/01/19   [provider]  brimonidine-timolol (COMBIGAN) 0.2-0.5 % ophthalmic solution Place 1 drop into both eyes every 12 (twelve) hours. 12/02/19   [provider]  carboxymethylcellulose (REFRESH PLUS) 0.5 % SOLN Place 1 drop into both eyes 3 (three) times daily as needed (Dry eyes). RETAIN eye drops instead of refresh    [provider]  cholecalciferol (VITAMIN D) 25 MCG (1000 UNIT) tablet Take 1,000 Units by mouth daily.    [provider]  clorazepate (TRANXENE) 7.5 MG tablet Take 7.5 mg by mouth 2 (two) times daily.    [provider]  desvenlafaxine (PRISTIQ) 100 MG 24 hr tablet Take 100 mg by mouth at bedtime.    [provider]  diltiazem (CARDIZEM CD) 240 MG 24 hr capsule Take 1 capsule (240 mg total) by mouth daily. 03/16/22   Biagio Borg, MD  ferrous sulfate 325 (65 FE) MG tablet Take 325 mg by mouth daily with breakfast.    [provider]  hydrALAZINE (APRESOLINE) 100 MG tablet Take 1 tablet (100 mg total) by mouth 3 (three) times daily. 04/09/22   Rai, Ripudeep Raliegh Ip, MD  INGREZZA 40 MG capsule Take 40 mg by mouth daily. 03/14/22   [provider]  isosorbide mononitrate (IMDUR) 30 MG 24 hr tablet Take 1 tablet (30 mg total) by mouth daily. 04/09/22   Rai, Ripudeep K, MD  latanoprost (XALATAN) 0.005 % ophthalmic solution Place 1 drop into both eyes at bedtime. 08/25/16   [provider]  Melatonin 5 MG TABS Take 10 mg by mouth at bedtime.    [provider]  Multiple Vitamins-Minerals (MULTIVITAMIN WITH MINERALS) tablet Take 1 tablet by mouth daily.    [provider]  Omega 3 1200 MG CAPS Take 2,400 mg by mouth at bedtime.    [provider]    Allergies:   Allergies  Allergen Reactions   Fluoxetine Hcl Other (See Comments)    (PROZAC)  Suicidal thoughts   Sulfa Antibiotics Rash   Chocolate Other (See Comments)    SOMETIMES CAUSES SEVERE HEADACHES   Floxin [Ocuflox] Anxiety    shaky   Other Nausea And Vomiting    INSTANT ICED TEA PACKETS   Sulfonamide Derivatives Rash    Social History:  reports that she has never smoked. She has never used smokeless tobacco. She reports current alcohol use of about 1.0 - 2.0 standard drink of alcohol per week. She reports that she does not use drugs.  Family History: Family History  Problem Relation Age of Onset   Diabetes Mother  Hypertension Mother    Stroke Mother    Heart disease Father 5   Hypertension Father    Hypertension Sister    Asthma Sister    Gout Sister    Colon cancer Neg Hx    Breast cancer Neg Hx    Esophageal cancer Neg Hx    Stomach cancer Neg Hx    Rectal cancer Neg Hx    Colon polyps Neg Hx     Physical Exam: Vitals:   06/04/22 1945 06/04/22 2000 06/04/22 2040 06/04/22 2100  BP: (!) 143/91 134/85 (!) 158/90 (!) 153/93  Pulse: 99 97 (!) 108 (!) 101  Resp: _0 Temp:      TempSrc:      SpO2: 99% 99% 100% 100%  Weight:      Height:        General:  awake, very ill appearing female. Follows directions but declining to speak. Eyes: PERRLA, glassy eyed, mildly erythematous, watery ENT: Moist oral mucosa, neck supple, no thyromegaly Lungs: clear to ascultation, shallow breathing, no wheeze, no crackles, no use of accessory muscles Cardiovascular: regular rate and rhythm, no regurgitation, no gallops, no murmurs. No carotid bruits, no JVD Abdomen: soft, positive BS, non-tender, non-distended, no organomegaly, not an acute abdomen GU: not examined Neuro: CN II - XII grossly intact, sensation intact Musculoskeletal: moves all extremities, foot drop LLE, flaccid appearing lower extremities Skin: no rash, no subcutaneous crepitation, no decubitus Psych: appropriate patient   Labs on Admission:  Recent Labs    06/04/22 1725  06/04/22 1830  NA 145 149*  K 4.2 3.5  CL  --  108  CO2  --  25  GLUCOSE  --  116*  BUN  --  30*  CREATININE  --  1.86*  CALCIUM  --  9.9   Recent Labs    06/04/22 1830  AST 61*  ALT 58*  ALKPHOS 74  BILITOT 1.0  PROT 6.3*  ALBUMIN 3.2*   No results for input(s): "LIPASE", "AMYLASE" in the last 72 hours. Recent Labs    06/04/22 1703 06/04/22 1725  WBC 22.2*  --   NEUTROABS 19.7*  --   HGB 16.7* 16.7*  HCT 51.3* 49.0*  MCV 86.8  --   PLT 345  --     Micro Results: Recent Results (from the past 240 hour(s))  Resp Panel by RT-PCR (Flu A&B, Covid) Anterior Nasal Swab     Status: None   Collection Time: 06/04/22  5:16 PM   Specimen: Anterior Nasal Swab  Result Value Ref Range Status   SARS Coronavirus 2 by RT PCR NEGATIVE NEGATIVE Final    Comment: (NOTE) SARS-CoV-2 target nucleic acids are NOT DETECTED.  The SARS-CoV-2 RNA is generally detectable in upper respiratory specimens during the acute phase of infection. The lowest concentration of SARS-CoV-2 viral copies this assay can detect is 138 copies/mL. A negative result does not preclude SARS-Cov-2 infection and should not be used as the sole basis for treatment or other patient management decisions. A negative result may occur with  improper specimen collection/handling, submission of specimen other than nasopharyngeal swab, presence of viral mutation(s) within the areas targeted by this assay, and inadequate number of viral copies(<138 copies/mL). A negative result must be combined with clinical observations, patient history, and epidemiological information. The expected result is Negative.  Fact Sheet for Patients:  EntrepreneurPulse.com.au  Fact Sheet for Healthcare Providers:  IncredibleEmployment.be  This test is no t yet approved or cleared  by the [place] and  has been authorized for detection and/or diagnosis of SARS-CoV-2 by FDA under an Emergency Use  Authorization (EUA). This EUA will remain  in effect (meaning this test can be used) for the duration of the COVID-19 declaration under Section 564(b)(1) of the Act, 21 U.S.C.section 360bbb-3(b)(1), unless the authorization is terminated  or revoked sooner.       Influenza A by PCR NEGATIVE NEGATIVE Final   Influenza B by PCR NEGATIVE NEGATIVE Final    Comment: (NOTE) The Xpert Xpress SARS-CoV-2/FLU/RSV plus assay is intended as an aid in the diagnosis of influenza from Nasopharyngeal swab specimens and should not be used as a sole basis for treatment. Nasal washings and aspirates are unacceptable for Xpert Xpress SARS-CoV-2/FLU/RSV testing.  Fact Sheet for Patients: EntrepreneurPulse.com.au  Fact Sheet for Healthcare Providers: IncredibleEmployment.be  This test is not yet approved or cleared by the [place] FDA and has been authorized for detection and/or diagnosis of SARS-CoV-2 by FDA under an Emergency Use Authorization (EUA). This EUA will remain in effect (meaning this test can be used) for the duration of the COVID-19 declaration under Section 564(b)(1) of the Act, 21 U.S.C. section 360bbb-3(b)(1), unless the authorization is terminated or revoked.  Performed at [facility], [place] [address] [place], [place] [address]      Radiological Exams on Admission: CT Head Wo Contrast  Result Date: 06/04/2022 CLINICAL DATA:  [place] EXAM: CT HEAD WITHOUT CONTRAST TECHNIQUE: Contiguous axial images were obtained from the base of the skull through the vertex without intravenous contrast. RADIATION DOSE REDUCTION: This exam was performed according to the departmental dose-optimization program which includes automated exposure control, adjustment of the mA and/or kV according to patient size and/or use of iterative reconstruction technique. COMPARISON:  Head CT 04/01/2022.  MRI brain 04/02/2022. FINDINGS: Brain: No evidence of acute  infarction, hemorrhage, hydrocephalus, extra-axial collection or mass lesion/mass effect. Vascular: No hyperdense vessel or unexpected calcification. Skull: Normal. Negative for fracture or focal lesion. Sinuses/Orbits: No acute finding. Other: None. IMPRESSION: No acute intracranial pathology. Electronically Signed   By: Ronney Asters M.D.   On: 06/04/2022 20:32   DG Chest Port 1 View  Result Date: 06/04/2022 CLINICAL DATA:  Sepsis EXAM: PORTABLE CHEST 1 VIEW COMPARISON:  04/01/2022 FINDINGS: Hazy airspace opacity of both lung bases, left greater than right. Mild cardiomegaly. Mediastinal contour unremarkable. No blunting of the costophrenic angles. Degenerative glenohumeral arthropathy bilaterally. IMPRESSION: 1. Hazy airspace opacity at both lung bases, left greater than right, suspicious for pneumonia or aspiration pneumonitis. 2. Mild cardiomegaly. 3. Degenerative glenohumeral arthropathy bilaterally. Electronically Signed   By: Van Clines M.D.   On: 06/04/2022 18:30    Assessment/Plan Present on Admission:  Sepsis due to pneumonia ([place]) Acute respiratory failure, on 4 L  Lactic acidosis -Admit to progressive care.   -Pneumonia order set initiated.  Patient on cefepime and vancomycin -Oxygen to keep sats greater than 88%, respiratory consult placed -Albuterol as needed -Blood and urine cultures collected, -Follow lactic acid level until normalized -IV fluid hydration -Very ill patient   Acute kidney injury superimposed on chronic kidney disease (HCC)/dehydration -IV fluid hydration, I's and O's -BMP in a.m.   Acute liver failure -Likely secondary to sepsis, CMP in a.m.   Elevated troponin -Likely secondary to sepsis and CKD, 197->367->291 -2D echo ordered.  Patient received aspirin suppository in ER -Patient on Eliquis for atrial fibrillation.  Unable to take p.o,  IV heparin initiated -Defer to a.m.  team cardiology consult  Atrial fibrillation with RVR -IV  heparin -Patient unable to tolerate p.o., -Scheduled metoprolol ordered   Hypernatremia -Continue LR, sodium decreasing 147, down from 149. -Follow with BMPs   Essential hypertension/CAD -Unable to take p.o. hydralazine, Imdur, Tranxene, Cardizem on hold -Schedule metoprolol with hold parameters   Obstructive sleep apnea -Patient protecting airways however, too ill for CPAP.   Hyperlipidemia -n.p.o.  Sarcoidosis   Theodus Ran 06/04/2022, 9:21 PM

## 2022-06-04 NOTE — Progress Notes (Signed)
Elink following code sepsis °

## 2022-06-04 NOTE — Progress Notes (Signed)
Notified bedside nurse of need to draw repeat lactic acid. 

## 2022-06-04 NOTE — ED Provider Notes (Signed)
[facility] EMERGENCY DEPARTMENT Provider Note   CSN: [id number] Arrival date & time: 06/04/22  1646     History {Add pertinent medical, surgical, social history, OB history to HPI:1} Chief Complaint  Patient presents with   Altered Mental Status    Tara [name] is a 72 y.o. female.  Patient as above with significant medical history as below, including depression, CKD3, htn, sarcoid, resides at [facility] who presents to the ED with complaint of ams. Per EMS they were called 2/2 ams, reduced responsiveness, dib. No home oxygen at baseline, on EMS arrival she was mid 80's on RA, placed on 4L [place] with improvement to 96%. HR elevated on EMS arrival, pt is able to answer questions very quietly. Unclear her last normal but typically is conversant per nursing facility. EMS reports difficulty getting clear story from nursing home and provider at facility was not available for questions.   Pt herself reports feeling unwell, having dib, no n/v, no chest pain. Poor appetite last few days.      Past Medical History:  Diagnosis Date   Anemia    on meds   ANKLE PAIN, LEFT 04/01/2008   ANXIETY 04/17/2007   on meds   Arthritis    generalized   Cataract    bilateral sx   Chronic kidney disease    stage 3   Colon polyp    COLONIC POLYPS, HX OF 08/01/2007   CONTUSIONS, MULTIPLE 04/01/2009   DEPRESSION 04/17/2007   on meds   DIZZINESS 08/01/2007   DYSPNEA 08/01/2007   with exertion   Enlargement of lymph nodes 08/13/2007   Excessive involuntary blinking    per pt,going on since 2018   Glaucoma    on meds   GLUCOSE INTOLERANCE 08/01/2007   Hypercalcemia due to sarcoidosis 2014   HYPERLIPIDEMIA 08/01/2007   no meds   HYPERSOMNIA 07/28/2008   HYPERTENSION 04/17/2007   Impaired glucose tolerance 03/23/2011   JOINT EFFUSION, LEFT KNEE 06/02/2010   Loose body in knee 04/01/2009   pt not sure?   Metabolic encephalopathy 83/25/4982-64/1583   Migraines    "stopped  3-4 yr ago" (07/23/2012)   Morbid obesity ([place]) 04/20/2007   OTHER DISEASES OF LUNG NOT ELSEWHERE CLASSIFIED 08/01/2007   Pain in joint, lower leg 04/01/2009   PERIPHERAL EDEMA 04/21/2009   Pre-diabetes    Sarcoidosis 09/25/2007   Seasonal allergies    SHOULDER PAIN, LEFT 04/01/2009   Sleep apnea    does not use Cpap    Past Surgical History:  Procedure Laterality Date   BREAST SURGERY     Biopsy benign/bil breaST   CARDIOVERSION N/A 12/15/2021   Procedure: CARDIOVERSION;  Surgeon: Skeet Latch, MD;  Location: [facility];  Service: Cardiovascular;  Laterality: N/A;   CARDIOVERSION N/A 02/09/2022   Procedure: CARDIOVERSION;  Surgeon: [name], MD;  Location: [facility];  Service: Cardiovascular;  Laterality: N/A;   CATARACT EXTRACTION     RIGHT EYE   COLONOSCOPY  2020   KN-MAC-suprep(good)-tics/TA's   COMBINED MEDIASTINOSCOPY AND BRONCHOSCOPY  08/2007   COMBINED MEDIASTINOSCOPY AND BRONCHOSCOPY  2009   Dental implant     FRACTURE SURGERY  ?02/1997   "left upper arm; put rod in" (07/23/2012)   GUM SURGERY  2000-?2009   "several ORs; soft tissue graft; took material from roof of mouth" (07/23/2012   HUMERUS SURGERY Left 1998   rod insertion   KNEE ARTHROSCOPY  03/2004; 06/2009   "right; left" Dr. Theda Sers   KNEE SURGERY  2012   ARTHROSCOPIC LEFT KNEE   LYMPH NODE BIOPSY  ~ 2009   "for sarcoidosis; don't know exactly which nodes" (07/23/2102)   [place]   Open   POLYPECTOMY  2020   TA's   REFRACTIVE SURGERY  08/1998   "both eyes" (07/23/2012)   [place]   Bil   TOTAL KNEE ARTHROPLASTY Left 09/24/2016   Procedure: TOTAL KNEE ARTHROPLASTY;  Surgeon: Dorna Leitz, MD;  Location: [facility];  Service: Orthopedics;  Laterality: Left;      Altered Mental Status Associated symptoms: fever   Associated symptoms: no nausea and no vomiting        Home Medications Prior to Admission medications   Medication Sig Start Date End Date Taking? Authorizing  Provider  amoxicillin (AMOXIL) 500 MG tablet Take 2,000 mg by mouth once. Prior to dental appointments 08/03/19   [provider]  antiseptic oral rinse (BIOTENE) LIQD 15 mLs by Mouth Rinse route 3 (three) times daily as needed for dry mouth or mouth pain.    [provider]  apixaban (ELIQUIS) 5 MG TABS tablet TAKE 1 TABLET(5 MG) BY MOUTH TWICE DAILY 05/10/22   Janina Mayo, MD  baclofen (LIORESAL) 10 MG tablet Take 5 mg by mouth 2 (two) times daily as needed (Headache). 09/01/19   [provider]  brimonidine-timolol (COMBIGAN) 0.2-0.5 % ophthalmic solution Place 1 drop into both eyes every 12 (twelve) hours. 12/02/19   [provider]  carboxymethylcellulose (REFRESH PLUS) 0.5 % SOLN Place 1 drop into both eyes 3 (three) times daily as needed (Dry eyes). RETAIN eye drops instead of refresh    [provider]  cholecalciferol (VITAMIN D) 25 MCG (1000 UNIT) tablet Take 1,000 Units by mouth daily.    [provider]  clorazepate (TRANXENE) 7.5 MG tablet Take 7.5 mg by mouth 2 (two) times daily.    [provider]  desvenlafaxine (PRISTIQ) 100 MG 24 hr tablet Take 100 mg by mouth at bedtime.    [provider]  diltiazem (CARDIZEM CD) 240 MG 24 hr capsule Take 1 capsule (240 mg total) by mouth daily. 03/16/22   Biagio Borg, MD  ferrous sulfate 325 (65 FE) MG tablet Take 325 mg by mouth daily with breakfast.    [provider]  hydrALAZINE (APRESOLINE) 100 MG tablet Take 1 tablet (100 mg total) by mouth 3 (three) times daily. 04/09/22   Rai, Ripudeep Raliegh Ip, MD  INGREZZA 40 MG capsule Take 40 mg by mouth daily. 03/14/22   [provider]  isosorbide mononitrate (IMDUR) 30 MG 24 hr tablet Take 1 tablet (30 mg total) by mouth daily. 04/09/22   Rai, Ripudeep K, MD  latanoprost (XALATAN) 0.005 % ophthalmic solution Place 1 drop into both eyes at bedtime. 08/25/16   [provider]  Melatonin 5 MG TABS Take 10 mg by  mouth at bedtime.    [provider]  Multiple Vitamins-Minerals (MULTIVITAMIN WITH MINERALS) tablet Take 1 tablet by mouth daily.    [provider]  Omega 3 1200 MG CAPS Take 2,400 mg by mouth at bedtime.    [provider]      Allergies    Fluoxetine hcl, Sulfa antibiotics, Chocolate, Floxin [ocuflox], Other, and Sulfonamide derivatives    Review of Systems   Review of Systems  Unable to perform ROS: Mental status change  Constitutional:  Positive for appetite change, fatigue and fever.  Respiratory:  Positive for shortness of breath.  Gastrointestinal:  Negative for nausea and vomiting.    Physical Exam Updated Vital Signs BP (!) 164/101   Pulse (!) 12   Temp (!) 102 F (38.9 C) (Rectal)   Resp (!) 26   Ht _0  (1.676 m)   Wt 77.1 kg   SpO2 97%   BMI 27.44 kg/m  Physical Exam Vitals and nursing note reviewed. Exam conducted with a chaperone present.  Constitutional:      General: She is not in acute distress.    Comments: Feverish  HENT:     Head: Normocephalic and atraumatic. No raccoon eyes, Battle's sign, right periorbital erythema or left periorbital erythema.     Jaw: There is normal jaw occlusion. No trismus.     Comments: Tongue dry, dried food on tongue     Right Ear: External ear normal.     Left Ear: External ear normal.     Nose: Nose normal.     Mouth/Throat:     Mouth: Mucous membranes are moist.  Eyes:     General: No scleral icterus.       Right eye: No discharge.        Left eye: No discharge.  Cardiovascular:     Rate and Rhythm: Tachycardia present. Rhythm irregular.     Pulses: Normal pulses.     Heart sounds: Normal heart sounds.  Pulmonary:     Effort: Pulmonary effort is normal. No respiratory distress.     Breath sounds: Normal breath sounds.  Abdominal:     General: Abdomen is flat.     Palpations: Abdomen is soft.     Tenderness: There is no abdominal tenderness.  Musculoskeletal:        General:  Normal range of motion.     Cervical back: Normal range of motion.     Right lower leg: No edema.     Left lower leg: No edema.  Skin:    General: Skin is warm and dry.     Capillary Refill: Capillary refill takes less than 2 seconds.       Neurological:     Mental Status: She is lethargic and disoriented.     Motor: No tremor.     Comments: Limited movement globally but able to move all 4 extremities Answers yes/no to questions or nod her head in response  Psychiatric:        Mood and Affect: Mood normal.        Behavior: Behavior normal. Behavior is cooperative.     ED Results / Procedures / Treatments   Labs (all labs ordered are listed, but only abnormal results are displayed) Labs Reviewed  I-STAT VENOUS BLOOD GAS, ED - Abnormal; Notable for the following components:      Result Value   pH, Ven 7.474 (*)    pCO2, Ven 37.3 (*)    pO2, Ven 49 (*)    Acid-Base Excess 4.0 (*)    HCT 49.0 (*)    Hemoglobin 16.7 (*)    All other components within normal limits  CBG MONITORING, ED - Abnormal; Notable for the following components:   Glucose-Capillary 146 (*)    All other components within normal limits  CULTURE, BLOOD (ROUTINE X 2)  CULTURE, BLOOD (ROUTINE X 2)  RESP PANEL BY RT-PCR (FLU A&B, COVID) ARPGX2  LACTIC ACID, PLASMA  LACTIC ACID, PLASMA  COMPREHENSIVE METABOLIC PANEL  CBC WITH DIFFERENTIAL/PLATELET  PROTIME-INR  APTT  URINALYSIS, ROUTINE W REFLEX MICROSCOPIC  BRAIN NATRIURETIC PEPTIDE  OSMOLALITY  ACETAMINOPHEN LEVEL  SALICYLATE LEVEL  TSH  T4, FREE  AMMONIA  TROPONIN I (HIGH SENSITIVITY)    EKG None  Radiology No results found.  Procedures .Critical Care  Performed by: Jeanell Sparrow, DO Authorized by: Jeanell Sparrow, DO   Critical care provider statement:    Critical care time (minutes):  50   Critical care time was exclusive of:  Separately billable procedures and treating other patients   Critical care was necessary to treat or prevent  imminent or life-threatening deterioration of the following conditions:  Respiratory failure and sepsis   Critical care was time spent personally by me on the following activities:  Development of treatment plan with patient or surrogate, discussions with consultants, evaluation of patient's response to treatment, examination of patient, ordering and review of laboratory studies, ordering and review of radiographic studies, ordering and performing treatments and interventions, pulse oximetry, re-evaluation of patient's condition, review of old charts and obtaining history from patient or surrogate   Care discussed with: admitting provider     {Document cardiac monitor, telemetry assessment procedure when appropriate:1}  Medications Ordered in ED Medications  lactated ringers infusion (has no administration in time range)  cefTRIAXone (ROCEPHIN) 2 g in sodium chloride 0.9 % 100 mL IVPB (2 g Intravenous New Bag/Given 06/04/22 1732)  azithromycin (ZITHROMAX) 500 mg in sodium chloride 0.9 % 250 mL IVPB (has no administration in time range)  lactated ringers bolus 1,000 mL (1,000 mLs Intravenous New Bag/Given 06/04/22 1711)    ED Course/ Medical Decision Making/ A&P                           Medical Decision Making Amount and/or Complexity of Data Reviewed Labs: ordered. Radiology: ordered. ECG/medicine tests: ordered.  Risk OTC drugs. Prescription drug management. Decision regarding hospitalization.   This patient presents to the ED with chief complaint(s) of ams, dib with pertinent past medical history of above which further complicates the presenting complaint. The complaint involves an extensive differential diagnosis and also carries with it a high risk of complications and morbidity.     Differential diagnosis for adult fever includes but is not exclusive to community-acquired pneumonia, urinary tract infection, acute cholecystitis, viral syndrome, cellulitis, tick bourne disease,   decubitus ulcer, necrotizing fasciitis, meningitis, encephalitis, influenza, etc.  Differential diagnoses for altered mental status includes but is not exclusive to alcohol, illicit or prescription medications, intracranial pathology such as stroke, intracerebral hemorrhage, fever or infectious causes including sepsis, hypoxemia, uremia, trauma, endocrine related disorders such as diabetes, hypoglycemia, thyroid-related diseases, etc.  In my evaluation of this patient's dyspnea my DDx includes, but is not limited to, pneumonia, pulmonary embolism, pneumothorax, pulmonary edema, metabolic acidosis, asthma, COPD, cardiac cause, anemia, anxiety, etc.   . Serious etiologies were considered.   The initial plan is to screening labs/imaging/activate code sepsis   Additional history obtained: Additional history obtained from EMS  Records reviewed previous admission documents, Primary Care Documents, and home meds, prior labs/imaging   Independent labs interpretation:  The following labs were independently interpreted:  WBC 22, LA elev >2, febrile, hypoxia, tachycardia ">> code sepsis 2/2 likely pna Trop is elev at 197, EKG stable, favor demand ischemia in setting of hypoxia, will trend Cr elev 1.8, baseline 1-1.3; concern for aki, likely from sepsis w/ end organ dysfunction   Independent visualization of imaging: - I independently visualized the following imaging with scope of interpretation limited to determining acute life  threatening conditions related to emergency care: CXR CTH, which revealed cXR w/ pna, CTH stable  Cardiac monitoring was reviewed and interpreted by myself which shows sinus tachy  Treatment and Reassessment: Sepsis protocol Rocephin/azithro 2L LR Apap Asa >> hemodynamics improved, resp status improving  Consultation: - Consulted or discussed management/test interpretation w/ external professional: na   Consideration for admission or further workup: Admission was  considered    Pt with sepsis likely 2/2 PNA, hypoxia, recommend admission, pt agreeable    Social Determinants of health: Social History   Tobacco Use   Smoking status: Never   Smokeless tobacco: Never   Tobacco comments:    Never smoke 12/25/21  Vaping Use   Vaping Use: Never used  Substance Use Topics   Alcohol use: Yes    Alcohol/week: 1.0 - 2.0 standard drink of alcohol    Types: 1 - 2 Standard drinks or equivalent per week   Drug use: No      {Document critical care time when appropriate:1} {Document review of labs and clinical decision tools ie heart score, Chads2Vasc2 etc:1}  {Document your independent review of radiology images, and any outside records:1} {Document your discussion with family members, caretakers, and with consultants:1} {Document social determinants of health affecting pt's care:1} {Document your decision making why or why not admission, treatments were needed:1} Final Clinical Impression(s) / ED Diagnoses Final diagnoses:  None    Rx / DC Orders ED Discharge Orders     None

## 2022-06-04 NOTE — ED Triage Notes (Signed)
Pt BIB EMS from [facility]. Facility reports pt is not eating or drinking and they are altered. Pt has edema. Pt is nonverbal with EMS, she is normally able to speak. FTT, possible pneumonia.   EMS A-fib 105-140 178/104 CBG 170 97% on 4L [place]   22# R AC

## 2022-06-05 ENCOUNTER — Inpatient Hospital Stay (HOSPITAL_COMMUNITY): Payer: BC Managed Care – PPO

## 2022-06-05 DIAGNOSIS — A419 Sepsis, unspecified organism: Secondary | ICD-10-CM | POA: Diagnosis not present

## 2022-06-05 DIAGNOSIS — J189 Pneumonia, unspecified organism: Secondary | ICD-10-CM | POA: Diagnosis not present

## 2022-06-05 DIAGNOSIS — R778 Other specified abnormalities of plasma proteins: Secondary | ICD-10-CM

## 2022-06-05 DIAGNOSIS — I480 Paroxysmal atrial fibrillation: Secondary | ICD-10-CM | POA: Diagnosis not present

## 2022-06-05 LAB — STREP PNEUMONIAE URINARY ANTIGEN: Strep Pneumo Urinary Antigen: NEGATIVE

## 2022-06-05 LAB — CBC WITH DIFFERENTIAL/PLATELET
Abs Immature Granulocytes: 0.09 10*3/uL — ABNORMAL HIGH (ref 0.00–0.07)
Basophils Absolute: 0.1 10*3/uL (ref 0.0–0.1)
Basophils Relative: 0 %
Eosinophils Absolute: 0 10*3/uL (ref 0.0–0.5)
Eosinophils Relative: 0 %
HCT: 39 % (ref 36.0–46.0)
Hemoglobin: 12.8 g/dL (ref 12.0–15.0)
Immature Granulocytes: 0 %
Lymphocytes Relative: 9 %
Lymphs Abs: 1.8 10*3/uL (ref 0.7–4.0)
MCH: 29 pg (ref 26.0–34.0)
MCHC: 32.8 g/dL (ref 30.0–36.0)
MCV: 88.4 fL (ref 80.0–100.0)
Monocytes Absolute: 1.1 10*3/uL — ABNORMAL HIGH (ref 0.1–1.0)
Monocytes Relative: 6 %
Neutro Abs: 17.5 10*3/uL — ABNORMAL HIGH (ref 1.7–7.7)
Neutrophils Relative %: 85 %
Platelets: 211 10*3/uL (ref 150–400)
RBC: 4.41 MIL/uL (ref 3.87–5.11)
RDW: 14.8 % (ref 11.5–15.5)
WBC: 20.7 10*3/uL — ABNORMAL HIGH (ref 4.0–10.5)
nRBC: 0 % (ref 0.0–0.2)

## 2022-06-05 LAB — APTT: aPTT: 96 seconds — ABNORMAL HIGH (ref 24–36)

## 2022-06-05 LAB — BASIC METABOLIC PANEL
Anion gap: 15 (ref 5–15)
BUN: 28 mg/dL — ABNORMAL HIGH (ref 8–23)
CO2: 25 mmol/L (ref 22–32)
Calcium: 9.3 mg/dL (ref 8.9–10.3)
Chloride: 107 mmol/L (ref 98–111)
Creatinine, Ser: 1.54 mg/dL — ABNORMAL HIGH (ref 0.44–1.00)
GFR, Estimated: 36 mL/min — ABNORMAL LOW (ref >60)
Glucose, Bld: 121 mg/dL — ABNORMAL HIGH (ref 70–99)
Potassium: 3.6 mmol/L (ref 3.5–5.1)
Sodium: 147 mmol/L — ABNORMAL HIGH (ref 135–145)

## 2022-06-05 LAB — ECHOCARDIOGRAM COMPLETE
AR max vel: 1.96 cm2
AV Area VTI: 1.91 cm2
AV Area mean vel: 1.84 cm2
AV Mean grad: 4 mmHg
AV Peak grad: 7.1 mmHg
Ao pk vel: 1.33 m/s
Height: 66 in
S' Lateral: 3 cm
Weight: 2720 oz

## 2022-06-05 LAB — OSMOLALITY: Osmolality: 316 mOsm/kg — ABNORMAL HIGH (ref 275–295)

## 2022-06-05 LAB — TSH: TSH: 1.137 u[IU]/mL (ref 0.350–4.500)

## 2022-06-05 LAB — HEPARIN LEVEL (UNFRACTIONATED): Heparin Unfractionated: >1.1 IU/mL — ABNORMAL HIGH (ref 0.30–0.70)

## 2022-06-05 LAB — TROPONIN I (HIGH SENSITIVITY)
Troponin I (High Sensitivity): 367 ng/L (ref <18)
Troponin I (High Sensitivity): 291 ng/L (ref <18)

## 2022-06-05 LAB — LACTIC ACID, PLASMA: Lactic Acid, Venous: 1.5 mmol/L (ref 0.5–1.9)

## 2022-06-05 LAB — MAGNESIUM: Magnesium: 1.9 mg/dL (ref 1.7–2.4)

## 2022-06-05 LAB — MRSA NEXT GEN BY PCR, NASAL: MRSA by PCR Next Gen: NOT DETECTED

## 2022-06-05 MED ORDER — ACETAMINOPHEN 10 MG/ML IV SOLN
1000.0000 mg | Freq: Once | INTRAVENOUS | Status: AC
  Administered 2022-06-05: 1000 mg via INTRAVENOUS
  Filled 2022-06-05: qty 100

## 2022-06-05 MED ORDER — LACTATED RINGERS IV SOLN: INTRAVENOUS | Status: AC

## 2022-06-05 MED ORDER — HEPARIN (PORCINE) 25000 UT/250ML-% IV SOLN
1100.0000 [IU]/h | INTRAVENOUS | Status: DC
  Administered 2022-06-05 – 2022-06-08 (×4): 1000 [IU]/h via INTRAVENOUS
  Administered 2022-06-09 – 2022-06-10 (×2): 1100 [IU]/h via INTRAVENOUS
  Filled 2022-06-05 (×8): qty 250

## 2022-06-05 MED ORDER — SODIUM CHLORIDE 0.9 % IV BOLUS
1000.0000 mL | Freq: Once | INTRAVENOUS | Status: AC
  Administered 2022-06-05: 1000 mL via INTRAVENOUS

## 2022-06-05 NOTE — ED Notes (Signed)
Anderson Malta (other) would like to be called with a status update on pt. Phone is [phone]

## 2022-06-05 NOTE — Progress Notes (Signed)
ANTICOAGULATION CONSULT NOTE - Initial Consult  Pharmacy Consult for Heparin Indication: atrial fibrillation  Allergies  Allergen Reactions   Fluoxetine Hcl Other (See Comments)    (PROZAC) Suicidal thoughts   Sulfa Antibiotics Rash   Chocolate Other (See Comments)    SOMETIMES CAUSES SEVERE HEADACHES   Floxin [Ocuflox] Anxiety    shaky   Other Nausea And Vomiting    INSTANT ICED TEA PACKETS   Sulfonamide Derivatives Rash    Patient Measurements: Height: '5\' 6"'$  (167.6 cm) Weight: 77.1 kg (170 lb) IBW/kg (Calculated) : 59.3 Heparin Dosing Weight: 75 kg  Vital Signs: Temp: 100.5 F (38.1 C) (11/20 2155) Temp Source: Rectal (11/20 2155) BP: 139/87 (11/21 0330) Pulse Rate: 83 (11/21 0330)  Labs: Recent Labs    06/04/22 1703 06/04/22 1725 06/04/22 1830 06/05/22 0152 06/05/22 0412  HGB 16.7* 16.7*  --  12.8  --   HCT 51.3* 49.0*  --  39.0  --   PLT 345  --   --  211  --   APTT 34  --   --   --   --   LABPROT 19.0*  --   --   --   --   INR 1.6*  --   --   --   --   HEPARINUNFRC  --   --   --   --  >1.10*  CREATININE  --   --  1.86* 1.54*  --   TROPONINIHS 197*  --  367* 291*  --     Estimated Creatinine Clearance: 36.1 mL/min (A) (by C-G formula based on SCr of 1.54 mg/dL (H)).   Medical History: Past Medical History:  Diagnosis Date   Anemia    on meds   ANKLE PAIN, LEFT 04/01/2008   ANXIETY 04/17/2007   on meds   Arthritis    generalized   Cataract    bilateral sx   Chronic kidney disease    stage 3   Colon polyp    COLONIC POLYPS, HX OF 08/01/2007   CONTUSIONS, MULTIPLE 04/01/2009   DEPRESSION 04/17/2007   on meds   DIZZINESS 08/01/2007   DYSPNEA 08/01/2007   with exertion   Enlargement of lymph nodes 08/13/2007   Excessive involuntary blinking    per pt,going on since 2018   Glaucoma    on meds   GLUCOSE INTOLERANCE 08/01/2007   Hypercalcemia due to sarcoidosis 2014   HYPERLIPIDEMIA 08/01/2007   no meds   HYPERSOMNIA 07/28/2008    HYPERTENSION 04/17/2007   Impaired glucose tolerance 03/23/2011   JOINT EFFUSION, LEFT KNEE 06/02/2010   Loose body in knee 04/01/2009   pt not sure?   Metabolic encephalopathy 75/04/2584-27/7824   Migraines    "stopped 3-4 yr ago" (07/23/2012)   Morbid obesity ([place]) 04/20/2007   OTHER DISEASES OF LUNG NOT ELSEWHERE CLASSIFIED 08/01/2007   Pain in joint, lower leg 04/01/2009   PERIPHERAL EDEMA 04/21/2009   Pre-diabetes    Sarcoidosis 09/25/2007   Seasonal allergies    SHOULDER PAIN, LEFT 04/01/2009   Sleep apnea    does not use Cpap    Medications:  No current facility-administered medications on file prior to encounter.   Current Outpatient Medications on File Prior to Encounter  Medication Sig Dispense Refill   amoxicillin (AMOXIL) 500 MG tablet Take 2,000 mg by mouth once. Prior to dental appointments     antiseptic oral rinse (BIOTENE) LIQD 15 mLs by Mouth Rinse route 3 (three) times daily as needed for dry  mouth or mouth pain.     apixaban (ELIQUIS) 5 MG TABS tablet TAKE 1 TABLET(5 MG) BY MOUTH TWICE DAILY 180 tablet 1   baclofen (LIORESAL) 10 MG tablet Take 5 mg by mouth 2 (two) times daily as needed (Headache).     brimonidine-timolol (COMBIGAN) 0.2-0.5 % ophthalmic solution Place 1 drop into both eyes every 12 (twelve) hours.     carboxymethylcellulose (REFRESH PLUS) 0.5 % SOLN Place 1 drop into both eyes 3 (three) times daily as needed (Dry eyes). RETAIN eye drops instead of refresh     cholecalciferol (VITAMIN D) 25 MCG (1000 UNIT) tablet Take 1,000 Units by mouth daily.     clorazepate (TRANXENE) 7.5 MG tablet Take 7.5 mg by mouth 2 (two) times daily.     desvenlafaxine (PRISTIQ) 100 MG 24 hr tablet Take 100 mg by mouth at bedtime.     diltiazem (CARDIZEM CD) 240 MG 24 hr capsule Take 1 capsule (240 mg total) by mouth daily. 90 capsule 1   ferrous sulfate 325 (65 FE) MG tablet Take 325 mg by mouth daily with breakfast.     hydrALAZINE (APRESOLINE) 100 MG tablet Take 1  tablet (100 mg total) by mouth 3 (three) times daily.     INGREZZA 40 MG capsule Take 40 mg by mouth daily.     isosorbide mononitrate (IMDUR) 30 MG 24 hr tablet Take 1 tablet (30 mg total) by mouth daily.     latanoprost (XALATAN) 0.005 % ophthalmic solution Place 1 drop into both eyes at bedtime.     Melatonin 5 MG TABS Take 10 mg by mouth at bedtime.     Multiple Vitamins-Minerals (MULTIVITAMIN WITH MINERALS) tablet Take 1 tablet by mouth daily.     Omega 3 1200 MG CAPS Take 2,400 mg by mouth at bedtime.       Assessment: 71 y.o. female with h/o Afib, Eliquis on hold, for heparin  Goal of Therapy:  aPTT 66-102 seconds Heparin level 0.3-0.7 units/ml Monitor platelets by anticoagulation protocol: Yes   Plan:  Start heparin 1000 units/hr Check aPTT in 8 hours   Aslynn Brunetti, Bronson Curb 06/05/2022,5:00 AM

## 2022-06-05 NOTE — Progress Notes (Incomplete)
Echocardiogram 2D Echocardiogram has been performed.  Ronny Flurry 06/05/2022, 1:12 PM

## 2022-06-05 NOTE — Progress Notes (Signed)
PROGRESS NOTE    Tara [name]  FTD:[id number] DOB: 03-25-1951 DOA: 06/04/2022 PCP: Biagio Borg, MD   Brief Narrative:  HPI: This is a 71 year old female nursing home resident with past medical history of CKD, depression, sarcoid, dyslipidemia, and hypertension.  Today the patient was noted to have decreased level of consciousness, 911 was called.   Patient brought to the ER, patient hypoxic into the 80s on room air.  Chest x-ray shows pneumonia.  She is currently on 4 L oxygen sats in the high 90s.  Patient remains lethargic and poorly responsive.  Patient does open her eyes when stimulated, she follows instructions moving extremities once asked.  She however will not talk and provides no history.  Patient is hemodynamically stable.  Assessment & Plan:   Principal Problem:   Sepsis due to pneumonia [facility]) Active Problems:   Sarcoidosis   Hyperlipidemia   Essential hypertension   Elevated troponin   Obstructive sleep apnea   Lactic acidosis   Acute kidney injury superimposed on chronic kidney disease (HCC)   Acute liver failure   Hypernatremia   Dehydration   Anxiety and depression   Paroxysmal atrial fibrillation with RVR (HCC)  Severe sepsis and acute respiratory failure with hypoxia due to community-acquired pneumonia [facility]): Patient meets criteria for severe sepsis based on fever 102, tachycardia, tachypnea, leukocytosis, acute hypoxic respiratory failure and lactic acid of 2.3.  Chest x-ray suggest pneumonia/aspiration pneumonitis.  Patient has been started on cefepime and vancomycin, no evidence of MRSA, MRSA PCR negative.  Will discontinue Rocephin but will continue cefepime.  Patient currently on 3 L of oxygen saturating 100%, will try to wean.  Lactic acidosis resolved resolved.  All cultures are negative so far.   Acute kidney injury superimposed on chronic kidney disease 3: Baseline creatinine appears to be around 1.2, presented with 1.86.  Likely due to dehydration.   Improving.     Acute liver failure ruled out: Patient has very minimally elevated LFTs, does not meet criteria for acute liver failure however elevated LFTs could very well be due to sepsis.  Will repeat tomorrow morning.    Elevated troponin: Patient had elevated but essentially flat troponin with she is not having any chest pain or shortness of breath, likely demand ischemia.  Transthoracic echo ordered, if there will be any wall motion abnormality, will consult cardiology.  Will continue heparin until echo is resulted.  EKG does not have any ischemic changes.   Atrial fibrillation with RVR: Has history of paroxysmal atrial fibrillation, was in RVR, likely in the setting of sepsis.  Patient very weak.  I have ordered SLP to see if she can take p.o.  In the meantime continue IV heparin and scheduled IV Lopressor.    Hypernatremia: Improving.  147 today.    Essential hypertension/CAD -Unable to take p.o. hydralazine, Imdur, Tranxene, Cardizem on hold -Schedule metoprolol with hold parameters.  Blood pressure controlled.    Obstructive sleep apnea -Patient protecting airways however, too ill for CPAP.    Hyperlipidemia -n.p.o.  Sarcoidosis  DVT prophylaxis: SCDs Start: 06/04/22 2221 heparin   Code Status: Full Code  Family Communication:  None present at bedside.    Status is: Inpatient Remains inpatient appropriate because: Patient very sick and septic.   Estimated body mass index is 27.44 kg/m as calculated from the following:   Height as of this encounter: '5\' 6"'$  (1.676 m).   Weight as of this encounter: 77.1 kg.    Nutritional Assessment: Body mass index  is 27.44 kg/m.Marland Kitchen Seen by dietician.  I agree with the assessment and plan as outlined below: Nutrition Status:        . Skin Assessment: I have examined the patient's skin and I agree with the wound assessment as performed by the wound care RN as outlined below:    Consultants:  None  Procedures:   None  Antimicrobials:  Anti-infectives (From admission, onward)    Start     Dose/Rate Route Frequency Ordered Stop   06/05/22 2200  vancomycin (VANCOREADY) IVPB 750 mg/150 mL  Status:  Discontinued        750 mg 150 mL/hr over 60 Minutes Intravenous Every 24 hours 06/04/22 2328 06/05/22 1019   06/04/22 2330  ceFEPIme (MAXIPIME) 2 g in sodium chloride 0.9 % 100 mL IVPB        2 g 200 mL/hr over 30 Minutes Intravenous Every 24 hours 06/04/22 2324     06/04/22 2330  vancomycin (VANCOREADY) IVPB 1500 mg/300 mL        1,500 mg 150 mL/hr over 120 Minutes Intravenous  Once 06/04/22 2324 06/05/22 0233   06/04/22 2230  cefTRIAXone (ROCEPHIN) 2 g in sodium chloride 0.9 % 100 mL IVPB  Status:  Discontinued        2 g 200 mL/hr over 30 Minutes Intravenous Every 24 hours 06/04/22 2223 06/04/22 2224   06/04/22 2230  azithromycin (ZITHROMAX) 500 mg in sodium chloride 0.9 % 250 mL IVPB  Status:  Discontinued        500 mg 250 mL/hr over 60 Minutes Intravenous Every 24 hours 06/04/22 2223 06/04/22 2224   06/04/22 1730  cefTRIAXone (ROCEPHIN) 2 g in sodium chloride 0.9 % 100 mL IVPB  Status:  Discontinued        2 g 200 mL/hr over 30 Minutes Intravenous Every 24 hours 06/04/22 1725 06/04/22 2321   06/04/22 1730  azithromycin (ZITHROMAX) 500 mg in sodium chloride 0.9 % 250 mL IVPB  Status:  Discontinued        500 mg 250 mL/hr over 60 Minutes Intravenous Every 24 hours 06/04/22 1725 06/04/22 2321         Subjective: Patient seen and examined.  She was very weak and slightly lethargic but despite of that she was oriented x 2.  She had no complaints.  Objective: Vitals:   06/05/22 0715 06/05/22 0800 06/05/22 0845 06/05/22 0930  BP: (!) 103/59 100/61 131/85 (!) 103/56  Pulse: 60  85 81  Resp: '16 16 17 16  '$ Temp:      TempSrc:      SpO2: 100% 99% 100% 100%  Weight:      Height:        Intake/Output Summary (Last 24 hours) at 06/05/2022 1105 Last data filed at 06/04/2022 1859 Gross per 24  hour  Intake 351.34 ml  Output --  Net 351.34 ml   Filed Weights   06/04/22 1657  Weight: 77.1 kg    Examination:  General exam: Appears lethargic and very weak. Respiratory system: Rhonchi bilaterally. Respiratory effort normal. Cardiovascular system: S1 & S2 heard, irregularly irregular rate and rhythm. No JVD, murmurs, rubs, gallops or clicks. No pedal edema. Gastrointestinal system: Abdomen is nondistended, soft and nontender. No organomegaly or masses felt. Normal bowel sounds heard. Central nervous system: Lethargic but oriented x 2. No focal neurological deficits. Extremities: Symmetric 5 x 5 power.   Data Reviewed: I have personally reviewed following labs and imaging studies  CBC: Recent Labs  Lab  06/04/22 1703 06/04/22 1725 06/05/22 0152  WBC 22.2*  --  20.7*  NEUTROABS 19.7*  --  17.5*  HGB 16.7* 16.7* 12.8  HCT 51.3* 49.0* 39.0  MCV 86.8  --  88.4  PLT 345  --  875   Basic Metabolic Panel: Recent Labs  Lab 06/04/22 1725 06/04/22 1830 06/05/22 0152  NA 145 149* 147*  K 4.2 3.5 3.6  CL  --  108 107  CO2  --  25 25  GLUCOSE  --  116* 121*  BUN  --  30* 28*  CREATININE  --  1.86* 1.54*  CALCIUM  --  9.9 9.3  MG  --   --  1.9   GFR: Estimated Creatinine Clearance: 36.1 mL/min (A) (by C-G formula based on SCr of 1.54 mg/dL (H)). Liver Function Tests: Recent Labs  Lab 06/04/22 1830  AST 61*  ALT 58*  ALKPHOS 74  BILITOT 1.0  PROT 6.3*  ALBUMIN 3.2*   No results for input(s): "LIPASE", "AMYLASE" in the last 168 hours. Recent Labs  Lab 06/04/22 1703  AMMONIA 33   Coagulation Profile: Recent Labs  Lab 06/04/22 1703  INR 1.6*   Cardiac Enzymes: No results for input(s): "CKTOTAL", "CKMB", "CKMBINDEX", "TROPONINI" in the last 168 hours. BNP (last 3 results) No results for input(s): "PROBNP" in the last 8760 hours. HbA1C: No results for input(s): "HGBA1C" in the last 72 hours. CBG: Recent Labs  Lab 06/04/22 1657  GLUCAP 146*    Lipid Profile: No results for input(s): "CHOL", "HDL", "LDLCALC", "TRIG", "CHOLHDL", "LDLDIRECT" in the last 72 hours. Thyroid Function Tests: Recent Labs    06/04/22 1703 06/04/22 1830  TSH 1.137  --   FREET4  --  1.03   Anemia Panel: No results for input(s): "VITAMINB12", "FOLATE", "FERRITIN", "TIBC", "IRON", "RETICCTPCT" in the last 72 hours. Sepsis Labs: Recent Labs  Lab 06/04/22 1703 06/04/22 1853 06/05/22 0038  LATICACIDVEN 2.2* 2.3* 1.5    Recent Results (from the past 240 hour(s))  Blood Culture (routine x 2)     Status: None (Preliminary result)   Collection Time: 06/04/22  5:03 PM   Specimen: BLOOD  Result Value Ref Range Status   Specimen Description BLOOD SITE NOT SPECIFIED  Final   Special Requests   Final    BOTTLES DRAWN AEROBIC AND ANAEROBIC Blood Culture adequate volume   Culture   Final    NO GROWTH < 24 HOURS Performed at [facility], [place] [address] [place], [place] [address]    Report Status PENDING  Incomplete  Resp Panel by RT-PCR (Flu A&B, Covid) Anterior Nasal Swab     Status: None   Collection Time: 06/04/22  5:16 PM   Specimen: Anterior Nasal Swab  Result Value Ref Range Status   SARS Coronavirus 2 by RT PCR NEGATIVE NEGATIVE Final    Comment: (NOTE) SARS-CoV-2 target nucleic acids are NOT DETECTED.  The SARS-CoV-2 RNA is generally detectable in upper respiratory specimens during the acute phase of infection. The lowest concentration of SARS-CoV-2 viral copies this assay can detect is 138 copies/mL. A negative result does not preclude SARS-Cov-2 infection and should not be used as the sole basis for treatment or other patient management decisions. A negative result may occur with  improper specimen collection/handling, submission of specimen other than nasopharyngeal swab, presence of viral mutation(s) within the areas targeted by this assay, and inadequate number of viral copies(<138 copies/mL). A negative result must be  combined with clinical observations, patient  history, and epidemiological information. The expected result is Negative.  Fact Sheet for Patients:  EntrepreneurPulse.com.au  Fact Sheet for Healthcare Providers:  IncredibleEmployment.be  This test is no t yet approved or cleared by the [place] FDA and  has been authorized for detection and/or diagnosis of SARS-CoV-2 by FDA under an Emergency Use Authorization (EUA). This EUA will remain  in effect (meaning this test can be used) for the duration of the COVID-19 declaration under Section 564(b)(1) of the Act, 21 U.S.C.section 360bbb-3(b)(1), unless the authorization is terminated  or revoked sooner.       Influenza A by PCR NEGATIVE NEGATIVE Final   Influenza B by PCR NEGATIVE NEGATIVE Final    Comment: (NOTE) The Xpert Xpress SARS-CoV-2/FLU/RSV plus assay is intended as an aid in the diagnosis of influenza from Nasopharyngeal swab specimens and should not be used as a sole basis for treatment. Nasal washings and aspirates are unacceptable for Xpert Xpress SARS-CoV-2/FLU/RSV testing.  Fact Sheet for Patients: EntrepreneurPulse.com.au  Fact Sheet for Healthcare Providers: IncredibleEmployment.be  This test is not yet approved or cleared by the [place] FDA and has been authorized for detection and/or diagnosis of SARS-CoV-2 by FDA under an Emergency Use Authorization (EUA). This EUA will remain in effect (meaning this test can be used) for the duration of the COVID-19 declaration under Section 564(b)(1) of the Act, 21 U.S.C. section 360bbb-3(b)(1), unless the authorization is terminated or revoked.  Performed at [facility], [place] [address], [place], [place] [address]   MRSA Next Gen by PCR, Nasal     Status: None   Collection Time: 06/05/22 12:30 AM   Specimen: Nasal Mucosa; Nasal Swab  Result Value Ref Range Status   MRSA by PCR Next  Gen NOT DETECTED NOT DETECTED Final    Comment: (NOTE) The GeneXpert MRSA Assay (FDA approved for NASAL specimens only), is one component of a comprehensive MRSA colonization surveillance program. It is not intended to diagnose MRSA infection nor to guide or monitor treatment for MRSA infections. Test performance is not FDA approved in patients less than 15 years old. Performed at [facility], [place] [address] [place], [place] [address]      Radiology Studies: CT Head Wo Contrast  Result Date: 06/04/2022 CLINICAL DATA:  Delirium EXAM: CT HEAD WITHOUT CONTRAST TECHNIQUE: Contiguous axial images were obtained from the base of the skull through the vertex without intravenous contrast. RADIATION DOSE REDUCTION: This exam was performed according to the departmental dose-optimization program which includes automated exposure control, adjustment of the mA and/or kV according to patient size and/or use of iterative reconstruction technique. COMPARISON:  Head CT 04/01/2022.  MRI brain 04/02/2022. FINDINGS: Brain: No evidence of acute infarction, hemorrhage, hydrocephalus, extra-axial collection or mass lesion/mass effect. Vascular: No hyperdense vessel or unexpected calcification. Skull: Normal. Negative for fracture or focal lesion. Sinuses/Orbits: No acute finding. Other: None. IMPRESSION: No acute intracranial pathology. Electronically Signed   By: Ronney Asters M.D.   On: 06/04/2022 20:32   DG Chest Port 1 View  Result Date: 06/04/2022 CLINICAL DATA:  Sepsis EXAM: PORTABLE CHEST 1 VIEW COMPARISON:  04/01/2022 FINDINGS: Hazy airspace opacity of both lung bases, left greater than right. Mild cardiomegaly. Mediastinal contour unremarkable. No blunting of the costophrenic angles. Degenerative glenohumeral arthropathy bilaterally. IMPRESSION: 1. Hazy airspace opacity at both lung bases, left greater than right, suspicious for pneumonia or aspiration pneumonitis. 2. Mild cardiomegaly. 3. Degenerative  glenohumeral arthropathy bilaterally. Electronically Signed   By: Thayer Jew  Janeece Fitting M.D.   On: 06/04/2022 18:30    Scheduled Meds:  metoprolol tartrate  5 mg Intravenous Q6H   Continuous Infusions:  ceFEPime (MAXIPIME) IV Stopped (06/05/22 0100)   heparin 1,000 Units/hr (06/05/22 0531)     LOS: 1 day   Darliss Cheney, MD [facility]  06/05/2022, 11:05 AM   *Please note that this is a verbal dictation therefore any spelling or grammatical errors are due to the "[place] [facility]" system interpretation.  Please page via [place] and do not message via secure chat for urgent patient care matters. Secure chat can be used for non urgent patient care matters.  How to contact the [facility] Attending or Consulting provider [place] or covering provider during after hours [place], for this patient?  Check the care team in [facility] and look for a) attending/consulting [facility] provider listed and b) the [facility] team listed. Page or secure chat [phone]. Log into [email or web address] and use [place]'s universal password to access. If you do not have the password, please contact the hospital operator. Locate the [facility] provider you are looking for under [facility] and page to a number that you can be directly reached. If you still have difficulty reaching the provider, please page the [facility] (Director on Call) for the Hospitalists listed on amion for assistance.

## 2022-06-05 NOTE — Progress Notes (Signed)
ANTICOAGULATION CONSULT NOTE  Pharmacy Consult for Heparin Indication: atrial fibrillation  Allergies  Allergen Reactions   Fluoxetine Hcl Other (See Comments)    (PROZAC) Suicidal thoughts   Sulfa Antibiotics Rash   Chocolate Other (See Comments)    SOMETIMES CAUSES SEVERE HEADACHES   Floxin [Ocuflox] Anxiety    shaky   Other Nausea And Vomiting    INSTANT ICED TEA PACKETS   Sulfonamide Derivatives Rash    Patient Measurements: Height: '5\' 6"'$  (167.6 cm) Weight: 77.1 kg (170 lb) IBW/kg (Calculated) : 59.3 Heparin Dosing Weight: 75 kg  Vital Signs: Temp: 98.2 F (36.8 C) (11/21 1156) Temp Source: Oral (11/21 1156) BP: 127/77 (11/21 1330) Pulse Rate: 73 (11/21 1330)  Labs: Recent Labs    06/04/22 1703 06/04/22 1725 06/04/22 1830 06/05/22 0152 06/05/22 0412 06/05/22 1346  HGB 16.7* 16.7*  --  12.8  --   --   HCT 51.3* 49.0*  --  39.0  --   --   PLT 345  --   --  211  --   --   APTT 34  --   --   --   --  96*  LABPROT 19.0*  --   --   --   --   --   INR 1.6*  --   --   --   --   --   HEPARINUNFRC  --   --   --   --  >1.10*  --   CREATININE  --   --  1.86* 1.54*  --   --   TROPONINIHS 197*  --  367* 291*  --   --      Estimated Creatinine Clearance: 36.1 mL/min (A) (by C-G formula based on SCr of 1.54 mg/dL (H)).   Medical History: Past Medical History:  Diagnosis Date   Anemia    on meds   ANKLE PAIN, LEFT 04/01/2008   ANXIETY 04/17/2007   on meds   Arthritis    generalized   Cataract    bilateral sx   Chronic kidney disease    stage 3   Colon polyp    COLONIC POLYPS, HX OF 08/01/2007   CONTUSIONS, MULTIPLE 04/01/2009   DEPRESSION 04/17/2007   on meds   DIZZINESS 08/01/2007   DYSPNEA 08/01/2007   with exertion   Enlargement of lymph nodes 08/13/2007   Excessive involuntary blinking    per pt,going on since 2018   Glaucoma    on meds   GLUCOSE INTOLERANCE 08/01/2007   Hypercalcemia due to sarcoidosis 2014   HYPERLIPIDEMIA 08/01/2007   no  meds   HYPERSOMNIA 07/28/2008   HYPERTENSION 04/17/2007   Impaired glucose tolerance 03/23/2011   JOINT EFFUSION, LEFT KNEE 06/02/2010   Loose body in knee 04/01/2009   pt not sure?   Metabolic encephalopathy 78/29/5621-30/8657   Migraines    "stopped 3-4 yr ago" (07/23/2012)   Morbid obesity ([place]) 04/20/2007   OTHER DISEASES OF LUNG NOT ELSEWHERE CLASSIFIED 08/01/2007   Pain in joint, lower leg 04/01/2009   PERIPHERAL EDEMA 04/21/2009   Pre-diabetes    Sarcoidosis 09/25/2007   Seasonal allergies    SHOULDER PAIN, LEFT 04/01/2009   Sleep apnea    does not use Cpap    Medications:  No current facility-administered medications on file prior to encounter.   Current Outpatient Medications on File Prior to Encounter  Medication Sig Dispense Refill   amoxicillin (AMOXIL) 500 MG tablet Take 2,000 mg by mouth once. Prior to  dental appointments     antiseptic oral rinse (BIOTENE) LIQD 15 mLs by Mouth Rinse route 3 (three) times daily as needed for dry mouth or mouth pain.     apixaban (ELIQUIS) 5 MG TABS tablet TAKE 1 TABLET(5 MG) BY MOUTH TWICE DAILY 180 tablet 1   baclofen (LIORESAL) 10 MG tablet Take 5 mg by mouth 2 (two) times daily as needed (Headache).     brimonidine-timolol (COMBIGAN) 0.2-0.5 % ophthalmic solution Place 1 drop into both eyes every 12 (twelve) hours.     carboxymethylcellulose (REFRESH PLUS) 0.5 % SOLN Place 1 drop into both eyes 3 (three) times daily as needed (Dry eyes). RETAIN eye drops instead of refresh     cholecalciferol (VITAMIN D) 25 MCG (1000 UNIT) tablet Take 1,000 Units by mouth daily.     clorazepate (TRANXENE) 7.5 MG tablet Take 7.5 mg by mouth 2 (two) times daily.     desvenlafaxine (PRISTIQ) 100 MG 24 hr tablet Take 100 mg by mouth at bedtime.     diltiazem (CARDIZEM CD) 240 MG 24 hr capsule Take 1 capsule (240 mg total) by mouth daily. 90 capsule 1   ferrous sulfate 325 (65 FE) MG tablet Take 325 mg by mouth daily with breakfast.     hydrALAZINE  (APRESOLINE) 100 MG tablet Take 1 tablet (100 mg total) by mouth 3 (three) times daily.     INGREZZA 40 MG capsule Take 40 mg by mouth daily.     isosorbide mononitrate (IMDUR) 30 MG 24 hr tablet Take 1 tablet (30 mg total) by mouth daily.     latanoprost (XALATAN) 0.005 % ophthalmic solution Place 1 drop into both eyes at bedtime.     Melatonin 5 MG TABS Take 10 mg by mouth at bedtime.     Multiple Vitamins-Minerals (MULTIVITAMIN WITH MINERALS) tablet Take 1 tablet by mouth daily.     Omega 3 1200 MG CAPS Take 2,400 mg by mouth at bedtime.       Assessment: 71 y.o. female presenting with AMS, in Afib wRVR started on IV heparin, on Eliquis PTA.  Initial aPTT therapeutic at 96 seconds on 1000 units/hr  Goal of Therapy:  aPTT 66-102 seconds Heparin level 0.3-0.7 units/ml Monitor platelets by anticoagulation protocol: Yes   Plan:  Continue heparin gtt at 1000 units/hr Daily aPTT/HL, CBC, s/s bleeding F/u ability to transition back to [place], PharmD Clinical Pharmacist ED Pharmacist Phone # [phone] 06/05/2022 2:58 PM

## 2022-06-05 NOTE — Evaluation (Signed)
Clinical/Bedside Swallow Evaluation Patient Details  Name: Tara [name] MRN: [id number] Date of Birth: 05-31-1951  Today's Date: 06/05/2022 Time: SLP Start Time (ACUTE ONLY): 1345 SLP Stop Time (ACUTE ONLY): 1400 SLP Time Calculation (min) (ACUTE ONLY): 15 min  Past Medical History:  Past Medical History:  Diagnosis Date   Anemia    on meds   ANKLE PAIN, LEFT 04/01/2008   ANXIETY 04/17/2007   on meds   Arthritis    generalized   Cataract    bilateral sx   Chronic kidney disease    stage 3   Colon polyp    COLONIC POLYPS, HX OF 08/01/2007   CONTUSIONS, MULTIPLE 04/01/2009   DEPRESSION 04/17/2007   on meds   DIZZINESS 08/01/2007   DYSPNEA 08/01/2007   with exertion   Enlargement of lymph nodes 08/13/2007   Excessive involuntary blinking    per pt,going on since 2018   Glaucoma    on meds   GLUCOSE INTOLERANCE 08/01/2007   Hypercalcemia due to sarcoidosis 2014   HYPERLIPIDEMIA 08/01/2007   no meds   HYPERSOMNIA 07/28/2008   HYPERTENSION 04/17/2007   Impaired glucose tolerance 03/23/2011   JOINT EFFUSION, LEFT KNEE 06/02/2010   Loose body in knee 04/01/2009   pt not sure?   Metabolic encephalopathy 12/09/2015-12/2015   Migraines    "stopped 3-4 yr ago" (07/23/2012)   Morbid obesity (HCC) 04/20/2007   OTHER DISEASES OF LUNG NOT ELSEWHERE CLASSIFIED 08/01/2007   Pain in joint, lower leg 04/01/2009   PERIPHERAL EDEMA 04/21/2009   Pre-diabetes    Sarcoidosis 09/25/2007   Seasonal allergies    SHOULDER PAIN, LEFT 04/01/2009   Sleep apnea    does not use Cpap   Past Surgical History:  Past Surgical History:  Procedure Laterality Date   BREAST SURGERY     Biopsy benign/bil breaST   CARDIOVERSION N/A 12/15/2021   Procedure: CARDIOVERSION;  Surgeon: Chilton Si, MD;  Location: [facility] ENDOSCOPY;  Service: Cardiovascular;  Laterality: N/A;   CARDIOVERSION N/A 02/09/2022   Procedure: CARDIOVERSION;  Surgeon: Chrystie Nose, MD;  Location: [facility] ENDOSCOPY;  Service:  Cardiovascular;  Laterality: N/A;   CATARACT EXTRACTION     RIGHT EYE   COLONOSCOPY  2020   KN-MAC-suprep(good)-tics/TA's   COMBINED MEDIASTINOSCOPY AND BRONCHOSCOPY  08/2007   COMBINED MEDIASTINOSCOPY AND BRONCHOSCOPY  2009   Dental implant     FRACTURE SURGERY  ?02/1997   "left upper arm; put rod in" (07/23/2012)   GUM SURGERY  2000-?2009   "several ORs; soft tissue graft; took material from roof of mouth" (07/23/2012   HUMERUS SURGERY Left 1998   rod insertion   KNEE ARTHROSCOPY  03/2004; 06/2009   "right; left" Dr. Thomasena Edis   KNEE SURGERY  2012   ARTHROSCOPIC LEFT KNEE   LYMPH NODE BIOPSY  ~ 2009   "for sarcoidosis; don't know exactly which nodes" (07/23/2102)   MYOMECTOMY  1994   Open   POLYPECTOMY  2020   TA's   REFRACTIVE SURGERY  08/1998   "both eyes" (07/23/2012)   REFRACTIVE SURGERY  2000   Bil   TOTAL KNEE ARTHROPLASTY Left 09/24/2016   Procedure: TOTAL KNEE ARTHROPLASTY;  Surgeon: Jodi Geralds, MD;  Location: [facility] OR;  Service: Orthopedics;  Laterality: Left;   HPI:  This is a 71 year old female nursing home resident with past medical history of CKD, depression, sarcoid, dyslipidemia, and hypertension. Severe sepsis and acute respiratory failure with hypoxia due to community-acquired pneumonia    Assessment / Plan / Recommendation  Clinical Impression  Pt demonstrates minimal ability to initiate movement. She was able to slightly move her head to nod yes when asked if she has been having trouble with swallowing, but does not initiate speech. Her movements for oral motor commands are limited but present. When given PO pt will briefly attempt to manipulate, but does not complete the necessary bolus formation or transit. Only two swallows observed over tirals of ice, an 1/4 teaspoon of water and 1/3 teapoon of puree. There was one instance of weak cough following water. Recommend pt remain NPO with alternate method of nutrition. SLP will f/u for therapeutic trials and diet  readiness. SLP Visit Diagnosis: Dysphagia, oropharyngeal phase (R13.12)    Aspiration Risk  Risk for inadequate nutrition/hydration;Severe aspiration risk    Diet Recommendation Alternative means - temporary;NPO        Other  Recommendations      Recommendations for follow up therapy are one component of a multi-disciplinary discharge planning process, led by the attending physician.  Recommendations may be updated based on patient status, additional functional criteria and insurance authorization.  Follow up Recommendations Skilled nursing-short term rehab (<3 hours/day)      Assistance Recommended at Discharge    Functional Status Assessment Patient has had a recent decline in their functional status and demonstrates the ability to make significant improvements in function in a reasonable and predictable amount of time.  Frequency and Duration min 2x/week  2 weeks       Prognosis        Swallow Study   General HPI: This is a 71 year old female nursing home resident with past medical history of CKD, depression, sarcoid, dyslipidemia, and hypertension. Severe sepsis and acute respiratory failure with hypoxia due to community-acquired pneumonia Type of Study: Bedside Swallow Evaluation Diet Prior to this Study: NPO Temperature Spikes Noted: No Respiratory Status: Room air History of Recent Intubation: No Behavior/Cognition: Lethargic/Drowsy Oral Cavity Assessment: Dry Self-Feeding Abilities: Total assist Patient Positioning: Partially reclined Baseline Vocal Quality: Not observed Volitional Cough: Weak    Oral/Motor/Sensory Function Overall Oral Motor/Sensory Function: Generalized oral weakness   Ice Chips Ice chips: Impaired Presentation: Spoon Oral Phase Impairments: Reduced labial seal Oral Phase Functional Implications: Prolonged oral transit;Oral residue;Oral holding   Thin Liquid Thin Liquid: Impaired Presentation: Spoon Oral Phase Impairments: Poor awareness of  bolus Oral Phase Functional Implications: Oral holding;Prolonged oral transit    Nectar Thick Nectar Thick Liquid: Not tested   Honey Thick Honey Thick Liquid: Not tested   Puree Puree: Impaired Presentation: Spoon Oral Phase Functional Implications: Oral residue;Prolonged oral transit   Solid     Solid: Not tested      Zowie Lundahl, Riley Nearing 06/05/2022,2:51 PM

## 2022-06-05 NOTE — ED Notes (Signed)
Pt's brief changed.  Mepilex applied.  Pt turned and placed on right side with pillow support.

## 2022-06-05 NOTE — ED Notes (Signed)
Echo at bedside

## 2022-06-05 NOTE — Plan of Care (Signed)

## 2022-06-05 NOTE — ED Notes (Signed)
MD Crosley notified of 2nd trop.

## 2022-06-06 ENCOUNTER — Inpatient Hospital Stay (HOSPITAL_COMMUNITY): Payer: BC Managed Care – PPO

## 2022-06-06 DIAGNOSIS — J189 Pneumonia, unspecified organism: Secondary | ICD-10-CM | POA: Diagnosis not present

## 2022-06-06 DIAGNOSIS — A419 Sepsis, unspecified organism: Secondary | ICD-10-CM | POA: Diagnosis not present

## 2022-06-06 LAB — CBC
HCT: 41.8 % (ref 36.0–46.0)
Hemoglobin: 13.8 g/dL (ref 12.0–15.0)
MCH: 29.1 pg (ref 26.0–34.0)
MCHC: 33 g/dL (ref 30.0–36.0)
MCV: 88.2 fL (ref 80.0–100.0)
Platelets: 180 10*3/uL (ref 150–400)
RBC: 4.74 MIL/uL (ref 3.87–5.11)
RDW: 14.6 % (ref 11.5–15.5)
WBC: 11.2 10*3/uL — ABNORMAL HIGH (ref 4.0–10.5)
nRBC: 0 % (ref 0.0–0.2)

## 2022-06-06 LAB — COMPREHENSIVE METABOLIC PANEL
ALT: 54 U/L — ABNORMAL HIGH (ref 0–44)
AST: 46 U/L — ABNORMAL HIGH (ref 15–41)
Albumin: 2.7 g/dL — ABNORMAL LOW (ref 3.5–5.0)
Alkaline Phosphatase: 63 U/L (ref 38–126)
Anion gap: 13 (ref 5–15)
BUN: 16 mg/dL (ref 8–23)
CO2: 25 mmol/L (ref 22–32)
Calcium: 9 mg/dL (ref 8.9–10.3)
Chloride: 107 mmol/L (ref 98–111)
Creatinine, Ser: 0.91 mg/dL (ref 0.44–1.00)
GFR, Estimated: >60 mL/min (ref >60)
Glucose, Bld: 68 mg/dL — ABNORMAL LOW (ref 70–99)
Potassium: 3.2 mmol/L — ABNORMAL LOW (ref 3.5–5.1)
Sodium: 145 mmol/L (ref 135–145)
Total Bilirubin: 0.9 mg/dL (ref 0.3–1.2)
Total Protein: 5.9 g/dL — ABNORMAL LOW (ref 6.5–8.1)

## 2022-06-06 LAB — HEPARIN LEVEL (UNFRACTIONATED): Heparin Unfractionated: >1.1 IU/mL — ABNORMAL HIGH (ref 0.30–0.70)

## 2022-06-06 LAB — URINE CULTURE

## 2022-06-06 LAB — MAGNESIUM
Magnesium: 1.9 mg/dL (ref 1.7–2.4)
Magnesium: 1.9 mg/dL (ref 1.7–2.4)
Magnesium: 2.1 mg/dL (ref 1.7–2.4)

## 2022-06-06 LAB — PHOSPHORUS
Phosphorus: 2.7 mg/dL (ref 2.5–4.6)
Phosphorus: 2.6 mg/dL (ref 2.5–4.6)

## 2022-06-06 LAB — GLUCOSE, CAPILLARY
Glucose-Capillary: 55 mg/dL — ABNORMAL LOW (ref 70–99)
Glucose-Capillary: 83 mg/dL (ref 70–99)
Glucose-Capillary: 76 mg/dL (ref 70–99)

## 2022-06-06 LAB — APTT: aPTT: 85 seconds — ABNORMAL HIGH (ref 24–36)

## 2022-06-06 MED ORDER — POTASSIUM CHLORIDE CRYS ER 20 MEQ PO TBCR: 40.0000 meq | EXTENDED_RELEASE_TABLET | ORAL | Status: DC

## 2022-06-06 MED ORDER — POTASSIUM CHLORIDE 10 MEQ/100ML IV SOLN
10.0000 meq | INTRAVENOUS | Status: AC
  Administered 2022-06-06 (×2): 10 meq via INTRAVENOUS
  Filled 2022-06-06 (×4): qty 100

## 2022-06-06 MED ORDER — JEVITY 1.2 CAL PO LIQD
1000.0000 mL | ORAL | Status: DC
  Administered 2022-06-06 – 2022-06-20 (×12): 1000 mL
  Filled 2022-06-06 (×25): qty 1000

## 2022-06-06 MED ORDER — PROSOURCE TF20 ENFIT COMPATIBL EN LIQD
60.0000 mL | Freq: Every day | ENTERAL | Status: DC
  Administered 2022-06-06 – 2022-06-26 (×20): 60 mL
  Filled 2022-06-06 (×20): qty 60

## 2022-06-06 MED ORDER — DEXTROSE 50 % IV SOLN
INTRAVENOUS | Status: AC
  Filled 2022-06-06: qty 50

## 2022-06-06 MED ORDER — DEXTROSE 50 % IV SOLN
12.5000 g | INTRAVENOUS | Status: AC
  Administered 2022-06-06: 12.5 g via INTRAVENOUS

## 2022-06-06 MED ORDER — POTASSIUM CHLORIDE 10 MEQ/100ML IV SOLN
10.0000 meq | INTRAVENOUS | Status: AC
  Administered 2022-06-06 (×2): 10 meq via INTRAVENOUS

## 2022-06-06 MED ORDER — SODIUM CHLORIDE 0.9 % IV SOLN
2.0000 g | Freq: Two times a day (BID) | INTRAVENOUS | Status: AC
  Administered 2022-06-06 – 2022-06-09 (×7): 2 g via INTRAVENOUS
  Filled 2022-06-06 (×7): qty 12.5

## 2022-06-06 NOTE — Progress Notes (Signed)
Speech Language Pathology Treatment: Dysphagia  Patient Details Name: Roshanda Balazs MRN: [id number] DOB: 1950-12-13 Today's Date: 06/06/2022 Time: 1540-1600 SLP Time Calculation (min) (ACUTE ONLY): 20 min  Assessment / Plan / Recommendation Clinical Impression  Patient seen by SLP for skilled session focused on dysphagia goals. She was awake and alert when SLP entered the room and concerned about getting her "feedings started". (Referring to Cortrak that was placed today but feedings not yet started.) She was agreeable to completing some oral care, with SLP using toothette sponges and oral rinse to help clean and moisten oral mucosa which was initially very dry in appearance. No significant amount of dried secretions observed during oral care. SLP then administered single ice chips which patient readily masticated and swallowed. She exhibited immediate coughing after each of the 4 ice chips, with cough sounding congested. A couple times, she was able to cough and transit some secretions to oropharynx which SLP then suctioned out.  Audible secretions remained after each coughing incident. RN entered room and was preparing to start Cortrak tube feedings, which patient was pleased about. SLP will continue to follow patient for PO readiness.   HPI HPI: This is a 71 year old female nursing home resident with past medical history of CKD, depression, sarcoid, dyslipidemia, and hypertension. Severe sepsis and acute respiratory failure with hypoxia due to community-acquired pneumonia      SLP Plan  Continue with current plan of care      Recommendations for follow up therapy are one component of a multi-disciplinary discharge planning process, led by the attending physician.  Recommendations may be updated based on patient status, additional functional criteria and insurance authorization.    Recommendations  Diet recommendations: NPO Medication Administration: Via alternative means                 Oral Care Recommendations: Oral care QID;Staff/trained caregiver to provide oral care Follow Up Recommendations: Skilled nursing-short term rehab (<3 hours/day) Assistance recommended at discharge: Frequent or constant Supervision/Assistance SLP Visit Diagnosis: Dysphagia, oropharyngeal phase (R13.12) Plan: Continue with current plan of care           Sonia Baller, MA, CCC-SLP Speech Therapy

## 2022-06-06 NOTE — Procedures (Signed)
Cortrak  Tube Type:  Cortrak - 43 inches Tube Location:  Left nare Initial Placement:  Stomach Secured by: Bridle Technique Used to Measure Tube Placement:  Marking at nare/corner of mouth Cortrak Secured At:  70 cm   Cortrak Tube Team Note:  Consult received to place a Cortrak feeding tube.   X-ray is required, abdominal x-ray has been ordered by the Cortrak team. Please confirm tube placement before using the Cortrak tube.   If the tube becomes dislodged please keep the tube and contact the Cortrak team at [email or web address] for replacement.  If after hours and replacement cannot be delayed, place a NG tube and confirm placement with an abdominal x-ray.    Hady Niemczyk MS, RD, LDN Please refer to AMION for RD and/or RD on-call/weekend/after hours pager   

## 2022-06-06 NOTE — Progress Notes (Signed)
Pharmacy Antibiotic Note  Tara [name] is a 71 y.o. female admitted on 06/04/2022 with sepsis possible PNA vs urinary source.  Pharmacy has been consulted for vancomycin and cefepime dosing on 11/20 . CXR shows airspace opacity at both lung bases c/f PNA vs asp pneumonitis.  >MRSA PCR negative.  Vancomycin discontinue 11/21  WBC down to 11.2,  Afebrile.  Scr has improved  from  1.86  to 0.91 (baseline 1.1-1.3) Estimated CrCl = 60 ml/min  Plan: Adjust Cefepime dose to 2g IV q12h due to improved renal function , CrCl =60 ml/min Monitor clinical status, renal function and culture results daily ,  LOT.   Height: '5\' 6"'$  (167.6 cm) Weight: 73.8 kg (162 lb 11.2 oz) (bedscale) IBW/kg (Calculated) : 59.3  Temp (24hrs), Avg:97.8 F (36.6 C), Min:97.5 F (36.4 C), Max:98.9 F (37.2 C)  Recent Labs  Lab 06/04/22 1703 06/04/22 1830 06/04/22 1853 06/05/22 0038 06/05/22 0152 06/06/22 0644  WBC 22.2*  --   --   --  20.7* 11.2*  CREATININE  --  1.86*  --   --  1.54* 0.91  LATICACIDVEN 2.2*  --  2.3* 1.5  --   --      Estimated Creatinine Clearance: 60 mL/min (by C-G formula based on SCr of 0.91 mg/dL).    Allergies  Allergen Reactions   Fluoxetine Hcl Other (See Comments)    (PROZAC) Suicidal thoughts   Sulfa Antibiotics Rash   Chocolate Other (See Comments)    SOMETIMES CAUSES SEVERE HEADACHES   Floxin [Ocuflox] Anxiety    shaky   Other Nausea And Vomiting    INSTANT ICED TEA PACKETS   Sulfonamide Derivatives Rash    Antimicrobials this admission: Vanc 11/20>11/21 Cefepime 11/20>  Dose adjustments this admission: 11/22:   Cefepime adjusted to 2g IV q12h due to improved renal function , CrCl =60 ml/min   Microbiology results: 11/20 Covid pcr: neg;  Flu : neg 11/20 Bcx : ngtd 11/20 UCx: negF 11/21 MRSA PCR neg   Thank you for allowing pharmacy to be a part of this patient's care.  Nicole Cella, RPh Clinical Pharmacist Please check AMION for all [place] phone  numbers After 10:00 PM, call [place] [phone] 06/06/2022 4:27 PM

## 2022-06-06 NOTE — Progress Notes (Signed)
PROGRESS NOTE    Tara [name]  VWU:[id number] DOB: April 13, 1951 DOA: 06/04/2022 PCP: Biagio Borg, MD   Brief Narrative:  HPI: This is a 71 year old female nursing home resident with past medical history of CKD, depression, sarcoid, dyslipidemia, and hypertension.  Today the patient was noted to have decreased level of consciousness, 911 was called.   Patient brought to the ER, patient hypoxic into the 80s on room air.  Chest x-ray shows pneumonia.  She is currently on 4 L oxygen sats in the high 90s.  Patient remains lethargic and poorly responsive.  Patient does open her eyes when stimulated, she follows instructions moving extremities once asked.  She however will not talk and provides no history.  Patient is hemodynamically stable.  Assessment & Plan:   Principal Problem:   Sepsis due to pneumonia [facility]) Active Problems:   Sarcoidosis   Hyperlipidemia   Essential hypertension   Elevated troponin   Obstructive sleep apnea   Lactic acidosis   Acute kidney injury superimposed on chronic kidney disease (HCC)   Acute liver failure   Hypernatremia   Dehydration   Anxiety and depression   Paroxysmal atrial fibrillation with RVR (HCC)  Severe sepsis and acute respiratory failure with hypoxia due to community-acquired pneumonia [facility]): Patient meets criteria for severe sepsis based on fever 102, tachycardia, tachypnea, leukocytosis, acute hypoxic respiratory failure and lactic acid of 2.3.  Chest x-ray suggest pneumonia/aspiration pneumonitis.  Continue cefepime.  Patient currently on 3 L of oxygen saturating 100%, I have ordered weaning to room air, lactic acidosis resolved resolved.  All cultures are negative so far.   Acute kidney injury superimposed on chronic kidney disease 3: Baseline creatinine appears to be around 1.2, presented with 1.86.  Now improved.  Hypokalemia: 3.2.  Will replace.   Acute liver failure ruled out: Patient has very minimally elevated LFTs, does not meet  criteria for acute liver failure however elevated LFTs could very well be due to sepsis.  Now improving.  Will repeat every other day.     Elevated troponin: Patient had elevated but essentially flat troponin with she is not having any chest pain or shortness of breath, likely demand ischemia.  Transthoracic echo rule out wall motion abnormality, 55% ejection fraction.  This is demand ischemia.     Atrial fibrillation with RVR: Has history of paroxysmal atrial fibrillation, was in RVR, likely in the setting of sepsis.  Patient very weak.  Patient will swallowing, she is a strict n.p.o.  Cortrack ordered for her, until then, we will continue her on heparin drip.    Hypernatremia: Improving.  145 today.    Essential hypertension/CAD -Unable to take p.o. hydralazine, Imdur, Tranxene, Cardizem on hold -Schedule metoprolol with hold parameters.  Blood pressure controlled.    Obstructive sleep apnea -Patient protecting airways however, too ill for CPAP.    Hyperlipidemia -n.p.o.  Sarcoidosis  DVT prophylaxis: SCDs Start: 06/04/22 2221 heparin   Code Status: Full Code I discussed with the patient about CODE STATUS and she prefers to be full code. Family Communication:  None present at bedside.  She says that she has no family around.  Her only sister lives in [place].  Status is: Inpatient Remains inpatient appropriate because: Patient very sick and septic.   Estimated body mass index is 27.44 kg/m as calculated from the following:   Height as of this encounter: '5\' 6"'$  (1.676 m).   Weight as of this encounter: 77.1 kg.    Nutritional Assessment: Body mass  index is 27.44 kg/m.Marland Kitchen Seen by dietician.  I agree with the assessment and plan as outlined below: Nutrition Status:        . Skin Assessment: I have examined the patient's skin and I agree with the wound assessment as performed by the wound care RN as outlined below:    Consultants:  None  Procedures:   None  Antimicrobials:  Anti-infectives (From admission, onward)    Start     Dose/Rate Route Frequency Ordered Stop   06/05/22 2200  vancomycin (VANCOREADY) IVPB 750 mg/150 mL  Status:  Discontinued        750 mg 150 mL/hr over 60 Minutes Intravenous Every 24 hours 06/04/22 2328 06/05/22 1019   06/04/22 2330  ceFEPIme (MAXIPIME) 2 g in sodium chloride 0.9 % 100 mL IVPB        2 g 200 mL/hr over 30 Minutes Intravenous Every 24 hours 06/04/22 2324     06/04/22 2330  vancomycin (VANCOREADY) IVPB 1500 mg/300 mL        1,500 mg 150 mL/hr over 120 Minutes Intravenous  Once 06/04/22 2324 06/05/22 0233   06/04/22 2230  cefTRIAXone (ROCEPHIN) 2 g in sodium chloride 0.9 % 100 mL IVPB  Status:  Discontinued        2 g 200 mL/hr over 30 Minutes Intravenous Every 24 hours 06/04/22 2223 06/04/22 2224   06/04/22 2230  azithromycin (ZITHROMAX) 500 mg in sodium chloride 0.9 % 250 mL IVPB  Status:  Discontinued        500 mg 250 mL/hr over 60 Minutes Intravenous Every 24 hours 06/04/22 2223 06/04/22 2224   06/04/22 1730  cefTRIAXone (ROCEPHIN) 2 g in sodium chloride 0.9 % 100 mL IVPB  Status:  Discontinued        2 g 200 mL/hr over 30 Minutes Intravenous Every 24 hours 06/04/22 1725 06/04/22 2321   06/04/22 1730  azithromycin (ZITHROMAX) 500 mg in sodium chloride 0.9 % 250 mL IVPB  Status:  Discontinued        500 mg 250 mL/hr over 60 Minutes Intravenous Every 24 hours 06/04/22 1725 06/04/22 2321         Subjective:  Patient seen and examined.  She is feeling better.  She is very weak but otherwise alert and oriented.  Has no complaints.  Objective: Vitals:   06/06/22 0505 06/06/22 0524 06/06/22 0800 06/06/22 0815  BP: (!) 155/78  (!) 147/90   Pulse: 72 87 60   Resp: '10 14 18   '$ Temp: 98.9 F (37.2 C)  97.7 F (36.5 C) 97.7 F (36.5 C)  TempSrc: Oral  Oral Oral  SpO2: 100%  100%   Weight:      Height:        Intake/Output Summary (Last 24 hours) at 06/06/2022 1055 Last data filed  at 06/06/2022 0620 Gross per 24 hour  Intake --  Output 1575 ml  Net -1575 ml    Filed Weights   06/04/22 1657  Weight: 77.1 kg    Examination:  General exam: Appears calm and comfortable but very weak. Respiratory system: Rhonchi bilaterally. Respiratory effort normal. Cardiovascular system: S1 & S2 heard, RRR. No JVD, murmurs, rubs, gallops or clicks.  +1-2 pitting edema bilateral lower extremity. Gastrointestinal system: Abdomen is nondistended, soft and nontender. No organomegaly or masses felt. Normal bowel sounds heard. Central nervous system: Alert and oriented. No focal neurological deficits. Extremities: Symmetric 5 x 5 power. Skin: No rashes, lesions or ulcers.  Psychiatry: Judgement and  insight appear normal. Mood & affect appropriate.   Data Reviewed: I have personally reviewed following labs and imaging studies  CBC: Recent Labs  Lab 06/04/22 1703 06/04/22 1725 06/05/22 0152 06/06/22 0644  WBC 22.2*  --  20.7* 11.2*  NEUTROABS 19.7*  --  17.5*  --   HGB 16.7* 16.7* 12.8 13.8  HCT 51.3* 49.0* 39.0 41.8  MCV 86.8  --  88.4 88.2  PLT 345  --  211 338    Basic Metabolic Panel: Recent Labs  Lab 06/04/22 1725 06/04/22 1830 06/05/22 0152 06/06/22 0644  NA 145 149* 147* 145  K 4.2 3.5 3.6 3.2*  CL  --  108 107 107  CO2  --  '25 25 25  '$ GLUCOSE  --  116* 121* 68*  BUN  --  30* 28* 16  CREATININE  --  1.86* 1.54* 0.91  CALCIUM  --  9.9 9.3 9.0  MG  --   --  1.9 1.9    GFR: Estimated Creatinine Clearance: 61.2 mL/min (by C-G formula based on SCr of 0.91 mg/dL). Liver Function Tests: Recent Labs  Lab 06/04/22 1830 06/06/22 0644  AST 61* 46*  ALT 58* 54*  ALKPHOS 74 63  BILITOT 1.0 0.9  PROT 6.3* 5.9*  ALBUMIN 3.2* 2.7*    No results for input(s): "LIPASE", "AMYLASE" in the last 168 hours. Recent Labs  Lab 06/04/22 1703  AMMONIA 33    Coagulation Profile: Recent Labs  Lab 06/04/22 1703  INR 1.6*    Cardiac Enzymes: No results for  input(s): "CKTOTAL", "CKMB", "CKMBINDEX", "TROPONINI" in the last 168 hours. BNP (last 3 results) No results for input(s): "PROBNP" in the last 8760 hours. HbA1C: No results for input(s): "HGBA1C" in the last 72 hours. CBG: Recent Labs  Lab 06/04/22 1657  GLUCAP 146*    Lipid Profile: No results for input(s): "CHOL", "HDL", "LDLCALC", "TRIG", "CHOLHDL", "LDLDIRECT" in the last 72 hours. Thyroid Function Tests: Recent Labs    06/04/22 1703 06/04/22 1830  TSH 1.137  --   FREET4  --  1.03    Anemia Panel: No results for input(s): "VITAMINB12", "FOLATE", "FERRITIN", "TIBC", "IRON", "RETICCTPCT" in the last 72 hours. Sepsis Labs: Recent Labs  Lab 06/04/22 1703 06/04/22 1853 06/05/22 0038  LATICACIDVEN 2.2* 2.3* 1.5     Recent Results (from the past 240 hour(s))  Blood Culture (routine x 2)     Status: None (Preliminary result)   Collection Time: 06/04/22  5:03 PM   Specimen: BLOOD  Result Value Ref Range Status   Specimen Description BLOOD SITE NOT SPECIFIED  Final   Special Requests   Final    BOTTLES DRAWN AEROBIC AND ANAEROBIC Blood Culture adequate volume   Culture   Final    NO GROWTH 2 DAYS Performed at [facility], [address], [place], [place] [address]    Report Status PENDING  Incomplete  Resp Panel by RT-PCR (Flu A&B, Covid) Anterior Nasal Swab     Status: None   Collection Time: 06/04/22  5:16 PM   Specimen: Anterior Nasal Swab  Result Value Ref Range Status   SARS Coronavirus 2 by RT PCR NEGATIVE NEGATIVE Final    Comment: (NOTE) SARS-CoV-2 target nucleic acids are NOT DETECTED.  The SARS-CoV-2 RNA is generally detectable in upper respiratory specimens during the acute phase of infection. The lowest concentration of SARS-CoV-2 viral copies this assay can detect is 138 copies/mL. A negative result does not preclude SARS-Cov-2 infection and should  not be used as the sole basis for treatment or other patient management decisions. A negative  result may occur with  improper specimen collection/handling, submission of specimen other than nasopharyngeal swab, presence of viral mutation(s) within the areas targeted by this assay, and inadequate number of viral copies(<138 copies/mL). A negative result must be combined with clinical observations, patient history, and epidemiological information. The expected result is Negative.  Fact Sheet for Patients:  EntrepreneurPulse.com.au  Fact Sheet for Healthcare Providers:  IncredibleEmployment.be  This test is no t yet approved or cleared by the [place] FDA and  has been authorized for detection and/or diagnosis of SARS-CoV-2 by FDA under an Emergency Use Authorization (EUA). This EUA will remain  in effect (meaning this test can be used) for the duration of the COVID-19 declaration under Section 564(b)(1) of the Act, 21 U.S.C.section 360bbb-3(b)(1), unless the authorization is terminated  or revoked sooner.       Influenza A by PCR NEGATIVE NEGATIVE Final   Influenza B by PCR NEGATIVE NEGATIVE Final    Comment: (NOTE) The Xpert Xpress SARS-CoV-2/FLU/RSV plus assay is intended as an aid in the diagnosis of influenza from Nasopharyngeal swab specimens and should not be used as a sole basis for treatment. Nasal washings and aspirates are unacceptable for Xpert Xpress SARS-CoV-2/FLU/RSV testing.  Fact Sheet for Patients: EntrepreneurPulse.com.au  Fact Sheet for Healthcare Providers: IncredibleEmployment.be  This test is not yet approved or cleared by the [place] FDA and has been authorized for detection and/or diagnosis of SARS-CoV-2 by FDA under an Emergency Use Authorization (EUA). This EUA will remain in effect (meaning this test can be used) for the duration of the COVID-19 declaration under Section 564(b)(1) of the Act, 21 U.S.C. section 360bbb-3(b)(1), unless the authorization is  terminated or revoked.  Performed at [facility], [place] [address], [place], [place] [address]   Urine Culture     Status: Abnormal   Collection Time: 06/04/22 10:00 PM   Specimen: Urine, Clean Catch  Result Value Ref Range Status   Specimen Description URINE, CLEAN CATCH  Final   Special Requests   Final    NONE Performed at [facility], [place] [address] [place], [place] [address]    Culture MULTIPLE SPECIES PRESENT, SUGGEST RECOLLECTION (A)  Final   Report Status 06/06/2022 FINAL  Final  MRSA Next Gen by PCR, Nasal     Status: None   Collection Time: 06/05/22 12:30 AM   Specimen: Nasal Mucosa; Nasal Swab  Result Value Ref Range Status   MRSA by PCR Next Gen NOT DETECTED NOT DETECTED Final    Comment: (NOTE) The GeneXpert MRSA Assay (FDA approved for NASAL specimens only), is one component of a comprehensive MRSA colonization surveillance program. It is not intended to diagnose MRSA infection nor to guide or monitor treatment for MRSA infections. Test performance is not FDA approved in patients less than 29 years old. Performed at [facility], [place] [address] [place],  [address]      Radiology Studies: ECHOCARDIOGRAM COMPLETE  Result Date: 06/05/2022    ECHOCARDIOGRAM REPORT   Patient Name:   ODETTE WATANABE Date of Exam: 06/05/2022 Medical Rec #:  [id number]     Height:       66.0 in Accession #:    [id number]    Weight:       170.0 lb Date of Birth:  Aug 14, 1950     BSA:  1.866 m Patient Age:    61 years      BP:           106/72 mmHg Patient Gender: F             HR:           68 bpm. Exam Location:  Inpatient Procedure: 2D Echo, Cardiac Doppler and Color Doppler Indications:    Elevated Troponin  History:        Patient has prior history of Echocardiogram examinations, most                 recent 01/11/2022. Arrythmias:Atrial Fibrillation; Risk                 Factors:Hypertension, Sleep Apnea and Dyslipidemia. CKD (chronic                 kidney  disease) stage 4, GFR 15-29 ml/min.  Sonographer:    Ronny Flurry Referring Phys: [phone] DEBBY CROSLEY IMPRESSIONS  1. Left ventricular ejection fraction, by estimation, is 55%. The left ventricle has normal function. The left ventricle has no regional wall motion abnormalities. There is mild asymmetric left ventricular hypertrophy. Left ventricular diastolic parameters are indeterminate.  2. Right ventricular systolic function is normal. The right ventricular size is normal.  3. Left atrial size was moderately dilated.  4. Right atrial size was moderately dilated.  5. The mitral valve is normal in structure. Trivial mitral valve regurgitation. No evidence of mitral stenosis.  6. The aortic valve is tricuspid. Aortic valve regurgitation is trivial. Aortic valve sclerosis/calcification is present, without any evidence of aortic stenosis. Comparison(s): No significant change from prior study. FINDINGS  Left Ventricle: Left ventricular ejection fraction, by estimation, is 55%. The left ventricle has normal function. The left ventricle has no regional wall motion abnormalities. The left ventricular internal cavity size was normal in size. There is mild asymmetric left ventricular hypertrophy. Left ventricular diastolic parameters are indeterminate. Right Ventricle: The right ventricular size is normal. Right vetricular wall thickness was not well visualized. Right ventricular systolic function is normal. Left Atrium: Left atrial size was moderately dilated. Right Atrium: Right atrial size was moderately dilated. Pericardium: There is no evidence of pericardial effusion. Mitral Valve: The mitral valve is normal in structure. Trivial mitral valve regurgitation. No evidence of mitral valve stenosis. Tricuspid Valve: The tricuspid valve is normal in structure. Tricuspid valve regurgitation is not demonstrated. No evidence of tricuspid stenosis. Aortic Valve: The aortic valve is tricuspid. Aortic valve regurgitation is  trivial. Aortic valve sclerosis/calcification is present, without any evidence of aortic stenosis. Aortic valve mean gradient measures 4.0 mmHg. Aortic valve peak gradient measures 7.1 mmHg. Aortic valve area, by VTI measures 1.91 cm. Pulmonic Valve: The pulmonic valve was not well visualized. Pulmonic valve regurgitation is not visualized. No evidence of pulmonic stenosis. Aorta: The ascending aorta was not well visualized and the aortic root is normal in size and structure. IAS/Shunts: No atrial level shunt detected by color flow Doppler.  LEFT VENTRICLE PLAX 2D LVIDd:         4.00 cm   Diastology LVIDs:         3.00 cm   LV e' medial:    10.30 cm/s LV PW:         1.00 cm   LV E/e' medial:  9.4 LV IVS:        1.30 cm   LV e' lateral:   12.60 cm/s LVOT diam:  1.80 cm   LV E/e' lateral: 7.7 LV SV:         49 LV SV Index:   26 LVOT Area:     2.54 cm  RIGHT VENTRICLE             IVC RV S prime:     10.60 cm/s  IVC diam: 1.70 cm TAPSE (M-mode): 1.9 cm LEFT ATRIUM             Index        RIGHT ATRIUM           Index LA diam:        3.80 cm 2.04 cm/m   RA Area:     13.45 cm LA Vol (A2C):   54.9 ml 29.42 ml/m  RA Volume:   25.45 ml  13.64 ml/m LA Vol (A4C):   56.0 ml 30.01 ml/m LA Biplane Vol: 56.5 ml 30.27 ml/m  AORTIC VALVE AV Area (Vmax):    1.96 cm AV Area (Vmean):   1.84 cm AV Area (VTI):     1.91 cm AV Vmax:           133.33 cm/s AV Vmean:          91.667 cm/s AV VTI:            0.255 m AV Peak Grad:      7.1 mmHg AV Mean Grad:      4.0 mmHg LVOT Vmax:         102.70 cm/s LVOT Vmean:        66.200 cm/s LVOT VTI:          0.192 m LVOT/AV VTI ratio: 0.75  AORTA Ao Root diam: 2.70 cm Ao Asc diam:  2.50 cm MV E velocity: 96.80 cm/s  TRICUSPID VALVE                            TR Peak grad:   23.6 mmHg                            TR Vmax:        243.00 cm/s                             SHUNTS                            Systemic VTI:  0.19 m                            Systemic Diam: 1.80 cm Rudean Haskell MD Electronically signed by Rudean Haskell MD Signature Date/Time: 06/05/2022/1:39:04 PM    Final    CT Head Wo Contrast  Result Date: 06/04/2022 CLINICAL DATA:  Delirium EXAM: CT HEAD WITHOUT CONTRAST TECHNIQUE: Contiguous axial images were obtained from the base of the skull through the vertex without intravenous contrast. RADIATION DOSE REDUCTION: This exam was performed according to the departmental dose-optimization program which includes automated exposure control, adjustment of the mA and/or kV according to patient size and/or use of iterative reconstruction technique. COMPARISON:  Head CT 04/01/2022.  MRI brain 04/02/2022. FINDINGS: Brain: No evidence of acute infarction, hemorrhage, hydrocephalus, extra-axial collection or mass lesion/mass effect. Vascular: No hyperdense vessel or unexpected calcification. Skull: Normal. Negative for fracture  or focal lesion. Sinuses/Orbits: No acute finding. Other: None. IMPRESSION: No acute intracranial pathology. Electronically Signed   By: Ronney Asters M.D.   On: 06/04/2022 20:32   DG Chest Port 1 View  Result Date: 06/04/2022 CLINICAL DATA:  Sepsis EXAM: PORTABLE CHEST 1 VIEW COMPARISON:  04/01/2022 FINDINGS: Hazy airspace opacity of both lung bases, left greater than right. Mild cardiomegaly. Mediastinal contour unremarkable. No blunting of the costophrenic angles. Degenerative glenohumeral arthropathy bilaterally. IMPRESSION: 1. Hazy airspace opacity at both lung bases, left greater than right, suspicious for pneumonia or aspiration pneumonitis. 2. Mild cardiomegaly. 3. Degenerative glenohumeral arthropathy bilaterally. Electronically Signed   By: Van Clines M.D.   On: 06/04/2022 18:30    Scheduled Meds:  metoprolol tartrate  5 mg Intravenous Q6H   Continuous Infusions:  ceFEPime (MAXIPIME) IV 2 g (06/05/22 2307)   heparin 1,000 Units/hr (06/06/22 0531)   potassium chloride 10 mEq (06/06/22 1053)     LOS: 2 days    Darliss Cheney, MD [facility]  06/06/2022, 10:55 AM   *Please note that this is a verbal dictation therefore any spelling or grammatical errors are due to the "[place]" system interpretation.  Please page via [place] and do not message via secure chat for urgent patient care matters. Secure chat can be used for non urgent patient care matters.  How to contact the [facility] Attending or Consulting provider [place] or covering provider during after hours [place], for this patient?  Check the care team in [facility] and look for a) attending/consulting [facility] provider listed and b) the [facility] team listed. Page or secure chat [phone]. Log into [email or web address] and use [place]'s universal password to access. If you do not have the password, please contact the hospital operator. Locate the [facility] provider you are looking for under [facility] and page to a number that you can be directly reached. If you still have difficulty reaching the provider, please page the [facility] (Director on Call) for the Hospitalists listed on amion for assistance.

## 2022-06-06 NOTE — Progress Notes (Signed)
Initial Nutrition Assessment  DOCUMENTATION CODES:   Not applicable  INTERVENTION:   Tube Feeds via Cortrak:  Start Jevity 1.2 at 20 mL/hr and advance by 10 mL q6h to goal rate of 60 mL/hr (1440 mL/day) 60 mL ProSource TF20 - daily  160 mL free water q6h Provides 1808 kcal, 100 gm protein, and 1802 mL free water daily. Monitor magnesium, potassium, and phosphorus BID for at least 3 days, MD to replete as needed, as pt is at risk for refeeding syndrome given NPO status for 2 days and report of recent weight loss.  NUTRITION DIAGNOSIS:   Inadequate oral intake related to inability to eat as evidenced by NPO status.  GOAL:   Patient will meet greater than or equal to 90% of their needs  MONITOR:   Diet advancement, Labs, TF tolerance, I & O's, Weight trends  REASON FOR ASSESSMENT:   Consult Enteral/tube feeding initiation and management  ASSESSMENT:   71 y.o. female presented to the ED with decreased consciousness from SNF. PMH includes CKD IV, MDD, and HTN. Pt admitted with sepsis secondary to PNA, AKI on CKD, acute liver failure, and elevated troponin.  11/22 - Cortrak placed (tip pyloric region)  Pt reports that she has been having pureed foods. States that she would get Ensure occasionally to take her medications. Pt endorses weight loss, thinks her weight is in the 160# range. RD was able to get a bed weight due to current weight being stated.   RD present for Cortrak placement. Pt asking when tube feeds will begin infusion, RD provided a rough estimate.   Medications reviewed and include: IV antibiotics, IV potassium Chloride Labs reviewed: Potassium 3.2  NUTRITION - FOCUSED PHYSICAL EXAM:  Flowsheet Row Most Recent Value  Orbital Region No depletion  Upper Arm Region No depletion  Thoracic and Lumbar Region No depletion  Buccal Region No depletion  Temple Region No depletion  Clavicle Bone Region Mild depletion  Clavicle and Acromion Bone Region Mild  depletion  Scapular Bone Region Mild depletion  Dorsal Hand No depletion  Patellar Region Mild depletion  Anterior Thigh Region Mild depletion  Posterior Calf Region Mild depletion  Edema (RD Assessment) Mild  Hair Reviewed  Eyes Reviewed  Mouth Reviewed  Skin Reviewed  Nails Reviewed   Diet Order:   Diet Order             Diet NPO time specified  Diet effective now                   EDUCATION NEEDS:   Not appropriate for education at this time  Skin:  Skin Assessment: Reviewed RN Assessment  Last BM:  Unknown  Height:   Ht Readings from Last 1 Encounters:  06/04/22 '5\' 6"'$  (1.676 m)    Weight:   Wt Readings from Last 1 Encounters:  06/06/22 73.8 kg    Ideal Body Weight:  59.1 kg  BMI:  Body mass index is 26.26 kg/m.  Estimated Nutritional Needs:   Kcal:  1700-1900  Protein:  85-100 grams  Fluid:  >/= 1.7 L    Hermina Barters RD, LDN Clinical Dietitian See [facility] for contact information.

## 2022-06-06 NOTE — Progress Notes (Signed)
ANTICOAGULATION CONSULT NOTE  Pharmacy Consult for Heparin Indication: atrial fibrillation  Allergies  Allergen Reactions   Fluoxetine Hcl Other (See Comments)    (PROZAC) Suicidal thoughts   Sulfa Antibiotics Rash   Chocolate Other (See Comments)    SOMETIMES CAUSES SEVERE HEADACHES   Floxin [Ocuflox] Anxiety    shaky   Other Nausea And Vomiting    INSTANT ICED TEA PACKETS   Sulfonamide Derivatives Rash    Patient Measurements: Height: '5\' 6"'$  (167.6 cm) Weight: 77.1 kg (170 lb) IBW/kg (Calculated) : 59.3 Heparin Dosing Weight: 75 kg  Vital Signs: Temp: 97.7 F (36.5 C) (11/22 0815) Temp Source: Oral (11/22 0815) BP: 147/90 (11/22 0800) Pulse Rate: 60 (11/22 0800)  Labs: Recent Labs    06/04/22 1703 06/04/22 1725 06/04/22 1830 06/05/22 0152 06/05/22 0412 06/05/22 1346 06/06/22 0644  HGB 16.7* 16.7*  --  12.8  --   --  13.8  HCT 51.3* 49.0*  --  39.0  --   --  41.8  PLT 345  --   --  211  --   --  180  APTT 34  --   --   --   --  96* 85*  LABPROT 19.0*  --   --   --   --   --   --   INR 1.6*  --   --   --   --   --   --   HEPARINUNFRC  --   --   --   --  >1.10*  --  >1.10*  CREATININE  --   --  1.86* 1.54*  --   --  0.91  TROPONINIHS 197*  --  367* 291*  --   --   --      Estimated Creatinine Clearance: 61.2 mL/min (by C-G formula based on SCr of 0.91 mg/dL).     Assessment: 71 y.o. female presenting with AMS, in Afib wRVR started on IV heparin, on Eliquis PTA.   aPTT  remains therapeutic on heparin at 1000 units/hr.   Heparin level  is elevated due to previously on Elquis PTA. Will use aPTTs to monitor heparin until HL correlate with aPTT.  CBC is within normal limits.  No bleeding reported.  Goal of Therapy:  aPTT 66-102 seconds Heparin level 0.3-0.7 units/ml Monitor platelets by anticoagulation protocol: Yes   Plan:  Continue heparin gtt at 1000 units/hr Daily aPTT/HL, CBC, s/s bleeding F/u ability to transition back to [place],  RPh Clinical Pharmacist [phone] 06/06/2022 9:52 AM Please check AMION for all [place] phone numbers After 10:00 PM, call [place] [phone]

## 2022-06-07 DIAGNOSIS — J189 Pneumonia, unspecified organism: Secondary | ICD-10-CM | POA: Diagnosis not present

## 2022-06-07 DIAGNOSIS — A419 Sepsis, unspecified organism: Secondary | ICD-10-CM | POA: Diagnosis not present

## 2022-06-07 LAB — COMPREHENSIVE METABOLIC PANEL
ALT: 45 U/L — ABNORMAL HIGH (ref 0–44)
AST: 37 U/L (ref 15–41)
Albumin: 2.8 g/dL — ABNORMAL LOW (ref 3.5–5.0)
Alkaline Phosphatase: 75 U/L (ref 38–126)
Anion gap: 12 (ref 5–15)
BUN: 16 mg/dL (ref 8–23)
CO2: 26 mmol/L (ref 22–32)
Calcium: 9.3 mg/dL (ref 8.9–10.3)
Chloride: 107 mmol/L (ref 98–111)
Creatinine, Ser: 0.85 mg/dL (ref 0.44–1.00)
GFR, Estimated: >60 mL/min (ref >60)
Glucose, Bld: 122 mg/dL — ABNORMAL HIGH (ref 70–99)
Potassium: 3 mmol/L — ABNORMAL LOW (ref 3.5–5.1)
Sodium: 145 mmol/L (ref 135–145)
Total Bilirubin: 0.7 mg/dL (ref 0.3–1.2)
Total Protein: 6 g/dL — ABNORMAL LOW (ref 6.5–8.1)

## 2022-06-07 LAB — PHOSPHORUS
Phosphorus: 1.9 mg/dL — ABNORMAL LOW (ref 2.5–4.6)
Phosphorus: 3 mg/dL (ref 2.5–4.6)

## 2022-06-07 LAB — APTT: aPTT: 73 seconds — ABNORMAL HIGH (ref 24–36)

## 2022-06-07 LAB — CBC
HCT: 44.3 % (ref 36.0–46.0)
Hemoglobin: 14.6 g/dL (ref 12.0–15.0)
MCH: 28.7 pg (ref 26.0–34.0)
MCHC: 33 g/dL (ref 30.0–36.0)
MCV: 87 fL (ref 80.0–100.0)
Platelets: 183 10*3/uL (ref 150–400)
RBC: 5.09 MIL/uL (ref 3.87–5.11)
RDW: 14.3 % (ref 11.5–15.5)
WBC: 10.6 10*3/uL — ABNORMAL HIGH (ref 4.0–10.5)
nRBC: 0 % (ref 0.0–0.2)

## 2022-06-07 LAB — HEPARIN LEVEL (UNFRACTIONATED): Heparin Unfractionated: 0.75 IU/mL — ABNORMAL HIGH (ref 0.30–0.70)

## 2022-06-07 LAB — MAGNESIUM
Magnesium: 1.8 mg/dL (ref 1.7–2.4)
Magnesium: 1.8 mg/dL (ref 1.7–2.4)

## 2022-06-07 LAB — GLUCOSE, CAPILLARY
Glucose-Capillary: 112 mg/dL — ABNORMAL HIGH (ref 70–99)
Glucose-Capillary: 122 mg/dL — ABNORMAL HIGH (ref 70–99)
Glucose-Capillary: 136 mg/dL — ABNORMAL HIGH (ref 70–99)
Glucose-Capillary: 145 mg/dL — ABNORMAL HIGH (ref 70–99)
Glucose-Capillary: 150 mg/dL — ABNORMAL HIGH (ref 70–99)
Glucose-Capillary: 156 mg/dL — ABNORMAL HIGH (ref 70–99)
Glucose-Capillary: 66 mg/dL — ABNORMAL LOW (ref 70–99)

## 2022-06-07 LAB — POTASSIUM: Potassium: 3.2 mmol/L — ABNORMAL LOW (ref 3.5–5.1)

## 2022-06-07 MED ORDER — MELATONIN 5 MG PO TABS
5.0000 mg | ORAL_TABLET | Freq: Every evening | ORAL | Status: DC | PRN
  Administered 2022-06-07 – 2022-07-01 (×20): 5 mg
  Filled 2022-06-07 (×21): qty 1

## 2022-06-07 MED ORDER — POTASSIUM CHLORIDE 10 MEQ/100ML IV SOLN
10.0000 meq | INTRAVENOUS | Status: AC
  Administered 2022-06-07 (×4): 10 meq via INTRAVENOUS
  Filled 2022-06-07 (×3): qty 100

## 2022-06-07 MED ORDER — POTASSIUM PHOSPHATES 15 MMOLE/5ML IV SOLN
30.0000 mmol | Freq: Once | INTRAVENOUS | Status: AC
  Administered 2022-06-07: 30 mmol via INTRAVENOUS
  Filled 2022-06-07: qty 10

## 2022-06-07 MED ORDER — DEXTROSE 50 % IV SOLN
12.5000 g | INTRAVENOUS | Status: AC
  Administered 2022-06-07: 12.5 g via INTRAVENOUS
  Filled 2022-06-07: qty 50

## 2022-06-07 NOTE — Plan of Care (Signed)
  Problem: Education: Goal: Knowledge of General Education information will improve Description: Including pain rating scale, medication(s)/side effects and non-pharmacologic comfort measures Outcome: Progressing   Problem: Nutrition: Goal: Adequate nutrition will be maintained Outcome: Progressing   

## 2022-06-07 NOTE — Evaluation (Signed)
Occupational Therapy Evaluation Patient Details Name: Tara [name] MRN: [id number] DOB: September 23, 1950 Today's Date: 06/07/2022   History of Present Illness Pt is a 71 y.o. female admitted from [facility] SNF with decreased responsiveness and not eating or drinking. Pt with multiple recent admission for weakness and fall. Patient on recent cardiology follow up was  placed on zio patch on 03/26/22 for 2 weeks. Patient  since 9/11 has had 3 ED visit for non-intractable HA,last of which was 9/16 for which she was evaluated ,treated and discharged home in improve condition. PMH significant for CKD, sarcoidosis, HTN, obesity,OSA on CPAP, atrial fibrillation on Eliquis, hx of presyncope seen in ed 3 x since 8/29.   Clinical Impression   PTA, pt was at [facility], and reports she was there for rehabilitation. Pt reporting she has become progressively weaker over the past few weeks. Upon eval, pt presents with headache, decreased activity tolerance, weakness, decreased balance, BUE tremor, and decreased cognition. Pt requiring up to mod A for UB ADL and up to total A for LB ADL. Pt performing bed mobility with min-mod A, but then with elevated HR and BP as well as headache, so returning to supine for pt safety. HR decreasing to 90s after rest and RN notified of elevated BP and HR movement. Recommending discharge to SNF for continued OT services to optimize safety and independence in ADL and IADL.      Recommendations for follow up therapy are one component of a multi-disciplinary discharge planning process, led by the attending physician.  Recommendations may be updated based on patient status, additional functional criteria and insurance authorization.   Follow Up Recommendations  Skilled nursing-short term rehab (<3 hours/day)     Assistance Recommended at Discharge Frequent or constant Supervision/Assistance  Patient can return home with the following Two people to help with walking and/or transfers;Two  people to help with bathing/dressing/bathroom;Assistance with cooking/housework;Assistance with feeding;Direct supervision/assist for medications management;Direct supervision/assist for financial management;Assist for transportation    Functional Status Assessment  Patient has had a recent decline in their functional status and demonstrates the ability to make significant improvements in function in a reasonable and predictable amount of time.  Equipment Recommendations  Other (comment) (defer to next venue)    Recommendations for Other Services       Precautions / Restrictions Precautions Precautions: Fall;Other (comment) Precaution Comments: watch HR and BP Restrictions Weight Bearing Restrictions: No      Mobility Bed Mobility Overal bed mobility: Needs Assistance Bed Mobility: Supine to Sit, Sit to Supine     Supine to sit: Min assist, Mod assist Sit to supine: Min assist   General bed mobility comments: Min A to cone to sitting, but up to mod A to bring hips toward EOB    Transfers                   General transfer comment: deferred due to pt with headache with movement      Balance                                           ADL either performed or assessed with clinical judgement   ADL Overall ADL's : Needs assistance/impaired Eating/Feeding: NPO   Grooming: Bed level;Minimal assistance   Upper Body Bathing: Bed level;Minimal assistance   Lower Body Bathing: Maximal assistance;Sitting/lateral leans   Upper Body Dressing : Sitting;Moderate  assistance   Lower Body Dressing: Maximal assistance;Sitting/lateral leans     Toilet Transfer Details (indicate cue type and reason): deferred due to elevated BP         Functional mobility during ADLs:  (deferred due to elevated BP)       Vision Baseline Vision/History: 0 No visual deficits Ability to See in Adequate Light: 0 Adequate Patient Visual Report: No change from  baseline Vision Assessment?: No apparent visual deficits     Perception     Praxis      Pertinent Vitals/Pain Pain Assessment Pain Assessment: Faces Faces Pain Scale: Hurts even more Pain Location: headache Pain Descriptors / Indicators: Headache, Discomfort Pain Intervention(s): Limited activity within patient's tolerance, Monitored during session     Hand Dominance Right   Extremity/Trunk Assessment Upper Extremity Assessment Upper Extremity Assessment: Generalized weakness;RUE deficits/detail;LUE deficits/detail RUE Deficits / Details: mild tremor that pt reports affects self feeding LUE Deficits / Details: mild tremor; L hand edamatous.   Lower Extremity Assessment Lower Extremity Assessment: Defer to PT evaluation   Cervical / Trunk Assessment Cervical / Trunk Assessment:  (forward rounding of shoulders)   Communication Communication Communication: No difficulties   Cognition Arousal/Alertness: Awake/alert Behavior During Therapy: WFL for tasks assessed/performed Overall Cognitive Status: No family/caregiver present to determine baseline cognitive functioning                                 General Comments: Pt reporting she had been at SNF for rehab; inconsistencies between recent notes and pt report. Pt agreeable to further rehabilitation. With tangential thought processes and perseverating on all that is "wrong" with her as well as that she has not walked recently.     General Comments  HR elevating to 130 sitting EOB and BP up 10 175/113; thus deferring further mobility. RN notified.    Exercises     Shoulder Instructions      Home Living Family/patient expects to be discharged to:: Skilled nursing facility                                        Prior Functioning/Environment Prior Level of Function : Needs assist             Mobility Comments: Recently at [facility] where she received assistance for ADL and  mobility. ADLs Comments: Recently at [facility] where she received assistance for ADL and mobility.        OT Problem List: Decreased strength;Decreased activity tolerance;Impaired balance (sitting and/or standing);Decreased safety awareness;Decreased cognition;Decreased coordination;Decreased knowledge of use of DME or AE;Cardiopulmonary status limiting activity;Impaired UE functional use;Pain      OT Treatment/Interventions: Self-care/ADL training;Therapeutic exercise;DME and/or AE instruction;Patient/family education;Balance training;Therapeutic activities    OT Goals(Current goals can be found in the care plan section) Acute Rehab OT Goals Patient Stated Goal: walk OT Goal Formulation: With patient Time For Goal Achievement: 06/21/22 Potential to Achieve Goals: Good  OT Frequency: Min 2X/week    Co-evaluation              AM-PAC OT "6 Clicks" Daily Activity     Outcome Measure Help from another person eating meals?: A Little Help from another person taking care of personal grooming?: A Little Help from another person toileting, which includes using toliet, bedpan, or urinal?: Total Help from another person bathing (  including washing, rinsing, drying)?: A Lot Help from another person to put on and taking off regular upper body clothing?: A Lot Help from another person to put on and taking off regular lower body clothing?: Total 6 Click Score: 12   End of Session Nurse Communication: Mobility status;Other (comment) (vitals)  Activity Tolerance: No increased pain;Treatment limited secondary to medical complications (Comment) (elevated BP and HR) Patient left: in bed;with call bell/phone within reach;with bed alarm set  OT Visit Diagnosis: Muscle weakness (generalized) (M62.81);Other symptoms and signs involving cognitive function;Feeding difficulties (R63.3);History of falling (Z91.81);Other symptoms and signs involving the nervous system (R29.898);Pain Pain - part of  body:  (headache)                Time: 3007-6226 OT Time Calculation (min): 33 min Charges:  OT General Charges $OT Visit: 1 Visit OT Evaluation $OT Eval Moderate Complexity: 1 Mod OT Treatments $Self Care/Home Management : 8-22 mins  Elder Cyphers, OTR/L [facility] Acute Rehabilitation Office: [phone]   Magnus Ivan 06/07/2022, 3:58 PM

## 2022-06-07 NOTE — Progress Notes (Signed)
ANTICOAGULATION CONSULT NOTE  Pharmacy Consult for Heparin Indication: atrial fibrillation  Allergies  Allergen Reactions   Fluoxetine Hcl Other (See Comments)    (PROZAC) Suicidal thoughts   Sulfa Antibiotics Rash   Chocolate Other (See Comments)    SOMETIMES CAUSES SEVERE HEADACHES   Floxin [Ocuflox] Anxiety    shaky   Other Nausea And Vomiting    INSTANT ICED TEA PACKETS   Sulfonamide Derivatives Rash    Patient Measurements: Height: '5\' 6"'$  (167.6 cm) Weight: 74.1 kg (163 lb 5.8 oz) IBW/kg (Calculated) : 59.3 Heparin Dosing Weight: 75 kg  Vital Signs: Temp: 97.5 F (36.4 C) (11/23 0400) Temp Source: Oral (11/23 0400) BP: 157/94 (11/23 0400) Pulse Rate: 82 (11/23 0400)  Labs: Recent Labs    06/04/22 1703 06/04/22 1725 06/04/22 1830 06/05/22 0152 06/05/22 0412 06/05/22 1346 06/06/22 0644 06/07/22 0442  HGB 16.7*   < >  --  12.8  --   --  13.8 14.6  HCT 51.3*   < >  --  39.0  --   --  41.8 44.3  PLT 345  --   --  211  --   --  180 183  APTT 34  --   --   --   --  96* 85* 73*  LABPROT 19.0*  --   --   --   --   --   --   --   INR 1.6*  --   --   --   --   --   --   --   HEPARINUNFRC  --   --   --   --  >1.10*  --  >1.10* 0.75*  CREATININE  --    < > 1.86* 1.54*  --   --  0.91 0.85  TROPONINIHS 197*  --  367* 291*  --   --   --   --    < > = values in this interval not displayed.     Estimated Creatinine Clearance: 64.3 mL/min (by C-G formula based on SCr of 0.85 mg/dL).     Assessment: 71 y.o. female presenting with AMS, in Afib w RVR started on IV heparin, on Eliquis PTA. Last reported dose the morning of 11/20. Pharmacy consulted to dose heparin.    aPTT 73 on drip rate 1000 units/hr therapeutic. Continues to be therapeutic. Heparin level 0.75 still supratherapeutic but trending down. Hgb 14.6 and plt 183. No s/sx bleeding noted in chart.  Heparin level is still elevated due to previously on Elquis PTA. Will continue to use aPTTs to monitor heparin  until HL correlate with aPTT.    Goal of Therapy:  aPTT 66-102 seconds Heparin level 0.3-0.7 units/ml Monitor platelets by anticoagulation protocol: Yes   Plan:  Continue heparin gtt at 1000 units/hr Daily aPTT/HL, CBC, s/s bleeding F/u ability to transition back to [place], PharmD PGY1 Pharmacy Resident   06/07/2022 7:24 AM  Please check AMION for all [place] phone numbers After 10:00 PM, call [place] [phone]

## 2022-06-07 NOTE — Progress Notes (Signed)
PROGRESS NOTE    Tara [name]  ZJQ:[id number] DOB: 1950-11-22 DOA: 06/04/2022 PCP: Biagio Borg, MD   Brief Narrative:  HPI: This is a 71 year old female nursing home resident with past medical history of CKD, depression, sarcoid, dyslipidemia, and hypertension.  Today the patient was noted to have decreased level of consciousness, 911 was called.   Patient brought to the ER, patient hypoxic into the 80s on room air.  Chest x-ray shows pneumonia.  She is currently on 4 L oxygen sats in the high 90s.  Patient remains lethargic and poorly responsive.  Patient does open her eyes when stimulated, she follows instructions moving extremities once asked.  She however will not talk and provides no history.  Patient is hemodynamically stable.  Assessment & Plan:   Principal Problem:   Sepsis due to pneumonia [facility]) Active Problems:   Sarcoidosis   Hyperlipidemia   Essential hypertension   Elevated troponin   Obstructive sleep apnea   Lactic acidosis   Acute kidney injury superimposed on chronic kidney disease (HCC)   Acute liver failure   Hypernatremia   Dehydration   Anxiety and depression   Paroxysmal atrial fibrillation with RVR (HCC)  Severe sepsis and acute respiratory failure with hypoxia due to community-acquired pneumonia [facility]): Patient meets criteria for severe sepsis based on fever 102, tachycardia, tachypnea, leukocytosis, acute hypoxic respiratory failure and lactic acid of 2.3.  Chest x-ray suggest pneumonia/aspiration pneumonitis.  Continue cefepime.  Patient currently on room air.  All cultures are negative so far.   Acute kidney injury superimposed on chronic kidney disease 3: Baseline creatinine appears to be around 1.2, presented with 1.86.  Now improved.  Hypokalemia: Low again.  Will replace.   Acute liver failure ruled out: Patient has very minimally elevated LFTs, does not meet criteria for acute liver failure however elevated LFTs could very well be due to sepsis.   Now improving.  Will repeat every other day.     Elevated troponin: Patient had elevated but essentially flat troponin with she is not having any chest pain or shortness of breath, likely demand ischemia.  Transthoracic echo rule out wall motion abnormality, 55% ejection fraction.  This is demand ischemia.     Atrial fibrillation with RVR: Has history of paroxysmal atrial fibrillation, was in RVR, likely in the setting of sepsis.  Patient very weak.  Patient will swallowing, she is a strict n.p.o. getting tube feedings through core track, we will continue her on heparin drip until she is able to take p.o.    Hypernatremia: Improved.  145 today.    Essential hypertension/CAD -Unable to take p.o. hydralazine, Imdur, Tranxene, Cardizem on hold -Schedule metoprolol with hold parameters.  Blood pressure controlled.    Obstructive sleep apnea -Patient protecting airways however, too ill for CPAP.    Hyperlipidemia -n.p.o.  Sarcoidosis  DVT prophylaxis: SCDs Start: 06/04/22 2221 heparin   Code Status: Full Code  Family Communication:  None present at bedside.  She says that she has no family around.  Her only sister lives in [place].  Status is: Inpatient Remains inpatient appropriate because: Patient very sick and septic and unable to swallow.   Estimated body mass index is 26.37 kg/m as calculated from the following:   Height as of this encounter: '5\' 6"'$  (1.676 m).   Weight as of this encounter: 74.1 kg.    Nutritional Assessment: Body mass index is 26.37 kg/m.Marland Kitchen Seen by dietician.  I agree with the assessment and plan as outlined below:  Nutrition Status: Nutrition Problem: Inadequate oral intake Etiology: inability to eat Signs/Symptoms: NPO status Interventions: Tube feeding  . Skin Assessment: I have examined the patient's skin and I agree with the wound assessment as performed by the wound care RN as outlined below:    Consultants:  None  Procedures:   None  Antimicrobials:  Anti-infectives (From admission, onward)    Start     Dose/Rate Route Frequency Ordered Stop   06/06/22 1730  ceFEPIme (MAXIPIME) 2 g in sodium chloride 0.9 % 100 mL IVPB        2 g 200 mL/hr over 30 Minutes Intravenous Every 12 hours 06/06/22 1627     06/05/22 2200  vancomycin (VANCOREADY) IVPB 750 mg/150 mL  Status:  Discontinued        750 mg 150 mL/hr over 60 Minutes Intravenous Every 24 hours 06/04/22 2328 06/05/22 1019   06/04/22 2330  ceFEPIme (MAXIPIME) 2 g in sodium chloride 0.9 % 100 mL IVPB  Status:  Discontinued        2 g 200 mL/hr over 30 Minutes Intravenous Every 24 hours 06/04/22 2324 06/06/22 1627   06/04/22 2330  vancomycin (VANCOREADY) IVPB 1500 mg/300 mL        1,500 mg 150 mL/hr over 120 Minutes Intravenous  Once 06/04/22 2324 06/05/22 0233   06/04/22 2230  cefTRIAXone (ROCEPHIN) 2 g in sodium chloride 0.9 % 100 mL IVPB  Status:  Discontinued        2 g 200 mL/hr over 30 Minutes Intravenous Every 24 hours 06/04/22 2223 06/04/22 2224   06/04/22 2230  azithromycin (ZITHROMAX) 500 mg in sodium chloride 0.9 % 250 mL IVPB  Status:  Discontinued        500 mg 250 mL/hr over 60 Minutes Intravenous Every 24 hours 06/04/22 2223 06/04/22 2224   06/04/22 1730  cefTRIAXone (ROCEPHIN) 2 g in sodium chloride 0.9 % 100 mL IVPB  Status:  Discontinued        2 g 200 mL/hr over 30 Minutes Intravenous Every 24 hours 06/04/22 1725 06/04/22 2321   06/04/22 1730  azithromycin (ZITHROMAX) 500 mg in sodium chloride 0.9 % 250 mL IVPB  Status:  Discontinued        500 mg 250 mL/hr over 60 Minutes Intravenous Every 24 hours 06/04/22 1725 06/04/22 2321         Subjective:  Seen and examined.  She says that she is improving.  No specific complaint but just generalized weakness.  Objective: Vitals:   06/07/22 0000 06/07/22 0400 06/07/22 0751 06/07/22 1200  BP: (!) 169/84 (!) 157/94 (!) 179/92 (!) 141/87  Pulse: 98 82 86 93  Resp: '14 12 15 17  '$ Temp: (!)  97.5 F (36.4 C) (!) 97.5 F (36.4 C) (!) 97.5 F (36.4 C) 97.7 F (36.5 C)  TempSrc: Oral Oral Oral Oral  SpO2:  98% 96% 97%  Weight:  74.1 kg    Height:        Intake/Output Summary (Last 24 hours) at 06/07/2022 1214 Last data filed at 06/07/2022 0643 Gross per 24 hour  Intake 1129.39 ml  Output 980 ml  Net 149.39 ml    Filed Weights   06/04/22 1657 06/06/22 1313 06/07/22 0400  Weight: 77.1 kg 73.8 kg 74.1 kg    Examination:  General exam: Appears calm and comfortable  Respiratory system: Clear to auscultation. Respiratory effort normal. Cardiovascular system: S1 & S2 heard, RRR. No JVD, murmurs, rubs, gallops or clicks. No pedal edema. Gastrointestinal  system: Abdomen is nondistended, soft and nontender. No organomegaly or masses felt. Normal bowel sounds heard. Central nervous system: Alert and oriented. No focal neurological deficits. Extremities: Symmetric 5 x 5 power. Skin: No rashes, lesions or ulcers.  Psychiatry: Judgement and insight appear normal. Mood & affect appropriate.   Data Reviewed: I have personally reviewed following labs and imaging studies  CBC: Recent Labs  Lab 06/04/22 1703 06/04/22 1725 06/05/22 0152 06/06/22 0644 06/07/22 0442  WBC 22.2*  --  20.7* 11.2* 10.6*  NEUTROABS 19.7*  --  17.5*  --   --   HGB 16.7* 16.7* 12.8 13.8 14.6  HCT 51.3* 49.0* 39.0 41.8 44.3  MCV 86.8  --  88.4 88.2 87.0  PLT 345  --  211 180 149    Basic Metabolic Panel: Recent Labs  Lab 06/04/22 1725 06/04/22 1830 06/05/22 0152 06/06/22 0644 06/06/22 1412 06/06/22 1817 06/07/22 0442  NA 145 149* 147* 145  --   --  145  K 4.2 3.5 3.6 3.2*  --   --  3.0*  CL  --  108 107 107  --   --  107  CO2  --  '25 25 25  '$ --   --  26  GLUCOSE  --  116* 121* 68*  --   --  122*  BUN  --  30* 28* 16  --   --  16  CREATININE  --  1.86* 1.54* 0.91  --   --  0.85  CALCIUM  --  9.9 9.3 9.0  --   --  9.3  MG  --   --  1.9 1.9 1.9 2.1 1.8  PHOS  --   --   --   --  2.7 2.6  1.9*    GFR: Estimated Creatinine Clearance: 64.3 mL/min (by C-G formula based on SCr of 0.85 mg/dL). Liver Function Tests: Recent Labs  Lab 06/04/22 1830 06/06/22 0644 06/07/22 0442  AST 61* 46* 37  ALT 58* 54* 45*  ALKPHOS 74 63 75  BILITOT 1.0 0.9 0.7  PROT 6.3* 5.9* 6.0*  ALBUMIN 3.2* 2.7* 2.8*    No results for input(s): "LIPASE", "AMYLASE" in the last 168 hours. Recent Labs  Lab 06/04/22 1703  AMMONIA 33    Coagulation Profile: Recent Labs  Lab 06/04/22 1703  INR 1.6*    Cardiac Enzymes: No results for input(s): "CKTOTAL", "CKMB", "CKMBINDEX", "TROPONINI" in the last 168 hours. BNP (last 3 results) No results for input(s): "PROBNP" in the last 8760 hours. HbA1C: No results for input(s): "HGBA1C" in the last 72 hours. CBG: Recent Labs  Lab 06/06/22 1929 06/07/22 0009 06/07/22 0255 06/07/22 0750 06/07/22 1206  GLUCAP 76 66* 122* 112* 156*    Lipid Profile: No results for input(s): "CHOL", "HDL", "LDLCALC", "TRIG", "CHOLHDL", "LDLDIRECT" in the last 72 hours. Thyroid Function Tests: Recent Labs    06/04/22 1703 06/04/22 1830  TSH 1.137  --   FREET4  --  1.03    Anemia Panel: No results for input(s): "VITAMINB12", "FOLATE", "FERRITIN", "TIBC", "IRON", "RETICCTPCT" in the last 72 hours. Sepsis Labs: Recent Labs  Lab 06/04/22 1703 06/04/22 1853 06/05/22 0038  LATICACIDVEN 2.2* 2.3* 1.5     Recent Results (from the past 240 hour(s))  Blood Culture (routine x 2)     Status: None (Preliminary result)   Collection Time: 06/04/22  5:03 PM   Specimen: BLOOD  Result Value Ref Range Status   Specimen Description BLOOD SITE NOT SPECIFIED  Final  Special Requests   Final    BOTTLES DRAWN AEROBIC AND ANAEROBIC Blood Culture adequate volume   Culture   Final    NO GROWTH 2 DAYS Performed at [facility], [place] [address], [place], [place] [address]    Report Status PENDING  Incomplete  Resp Panel by RT-PCR (Flu A&B, Covid) Anterior Nasal  Swab     Status: None   Collection Time: 06/04/22  5:16 PM   Specimen: Anterior Nasal Swab  Result Value Ref Range Status   SARS Coronavirus 2 by RT PCR NEGATIVE NEGATIVE Final    Comment: (NOTE) SARS-CoV-2 target nucleic acids are NOT DETECTED.  The SARS-CoV-2 RNA is generally detectable in upper respiratory specimens during the acute phase of infection. The lowest concentration of SARS-CoV-2 viral copies this assay can detect is 138 copies/mL. A negative result does not preclude SARS-Cov-2 infection and should not be used as the sole basis for treatment or other patient management decisions. A negative result may occur with  improper specimen collection/handling, submission of specimen other than nasopharyngeal swab, presence of viral mutation(s) within the areas targeted by this assay, and inadequate number of viral copies(<138 copies/mL). A negative result must be combined with clinical observations, patient history, and epidemiological information. The expected result is Negative.  Fact Sheet for Patients:  EntrepreneurPulse.com.au  Fact Sheet for Healthcare Providers:  IncredibleEmployment.be  This test is no t yet approved or cleared by the [place] FDA and  has been authorized for detection and/or diagnosis of SARS-CoV-2 by FDA under an Emergency Use Authorization (EUA). This EUA will remain  in effect (meaning this test can be used) for the duration of the COVID-19 declaration under Section 564(b)(1) of the Act, 21 U.S.C.section 360bbb-3(b)(1), unless the authorization is terminated  or revoked sooner.       Influenza A by PCR NEGATIVE NEGATIVE Final   Influenza B by PCR NEGATIVE NEGATIVE Final    Comment: (NOTE) The Xpert Xpress SARS-CoV-2/FLU/RSV plus assay is intended as an aid in the diagnosis of influenza from Nasopharyngeal swab specimens and should not be used as a sole basis for treatment. Nasal washings and aspirates  are unacceptable for Xpert Xpress SARS-CoV-2/FLU/RSV testing.  Fact Sheet for Patients: EntrepreneurPulse.com.au  Fact Sheet for Healthcare Providers: IncredibleEmployment.be  This test is not yet approved or cleared by the [place] FDA and has been authorized for detection and/or diagnosis of SARS-CoV-2 by FDA under an Emergency Use Authorization (EUA). This EUA will remain in effect (meaning this test can be used) for the duration of the COVID-19 declaration under Section 564(b)(1) of the Act, 21 U.S.C. section 360bbb-3(b)(1), unless the authorization is terminated or revoked.  Performed at [facility], [place] [address], [place], [place] [address]   Urine Culture     Status: Abnormal   Collection Time: 06/04/22 10:00 PM   Specimen: Urine, Clean Catch  Result Value Ref Range Status   Specimen Description URINE, CLEAN CATCH  Final   Special Requests   Final    NONE Performed at [facility], [place] [address], [place], [place] [address]    Culture MULTIPLE SPECIES PRESENT, SUGGEST RECOLLECTION (A)  Final   Report Status 06/06/2022 FINAL  Final  MRSA Next Gen by PCR, Nasal     Status: None   Collection Time: 06/05/22 12:30 AM   Specimen: Nasal Mucosa; Nasal Swab  Result Value Ref Range Status   MRSA by PCR Next Gen NOT DETECTED NOT DETECTED Final  Comment: (NOTE) The GeneXpert MRSA Assay (FDA approved for NASAL specimens only), is one component of a comprehensive MRSA colonization surveillance program. It is not intended to diagnose MRSA infection nor to guide or monitor treatment for MRSA infections. Test performance is not FDA approved in patients less than 22 years old. Performed at [facility], [place] [address], [place], [place] [address]      Radiology Studies: DG Abd Portable 1V  Result Date: 06/06/2022 CLINICAL DATA:  Encounter for feeding tube placement. EXAM: PORTABLE ABDOMEN - 1 VIEW COMPARISON:  None  Available. FINDINGS: The bowel gas pattern is normal. Weighted tip feeding tube with distal tip projecting over the pyloric region. No radio-opaque calculi or other significant radiographic abnormality are seen. IMPRESSION: Weighted tip feeding tube with distal tip projecting over the pyloric region. Electronically Signed   By: Keane Police D.O.   On: 06/06/2022 12:09   ECHOCARDIOGRAM COMPLETE  Result Date: 06/05/2022    ECHOCARDIOGRAM REPORT   Patient Name:   VIANNE GRIESHOP Date of Exam: 06/05/2022 Medical Rec #:  [id number]     Height:       66.0 in Accession #:    [id number]    Weight:       170.0 lb Date of Birth:  August 10, 1950     BSA:          1.866 m Patient Age:    7 years      BP:           106/72 mmHg Patient Gender: F             HR:           68 bpm. Exam Location:  Inpatient Procedure: 2D Echo, Cardiac Doppler and Color Doppler Indications:    Elevated Troponin  History:        Patient has prior history of Echocardiogram examinations, most                 recent 01/11/2022. Arrythmias:Atrial Fibrillation; Risk                 Factors:Hypertension, Sleep Apnea and Dyslipidemia. CKD (chronic                 kidney disease) stage 4, GFR 15-29 ml/min.  Sonographer:    Ronny Flurry Referring Phys: [id number] DEBBY CROSLEY IMPRESSIONS  1. Left ventricular ejection fraction, by estimation, is 55%. The left ventricle has normal function. The left ventricle has no regional wall motion abnormalities. There is mild asymmetric left ventricular hypertrophy. Left ventricular diastolic parameters are indeterminate.  2. Right ventricular systolic function is normal. The right ventricular size is normal.  3. Left atrial size was moderately dilated.  4. Right atrial size was moderately dilated.  5. The mitral valve is normal in structure. Trivial mitral valve regurgitation. No evidence of mitral stenosis.  6. The aortic valve is tricuspid. Aortic valve regurgitation is trivial. Aortic valve sclerosis/calcification is  present, without any evidence of aortic stenosis. Comparison(s): No significant change from prior study. FINDINGS  Left Ventricle: Left ventricular ejection fraction, by estimation, is 55%. The left ventricle has normal function. The left ventricle has no regional wall motion abnormalities. The left ventricular internal cavity size was normal in size. There is mild asymmetric left ventricular hypertrophy. Left ventricular diastolic parameters are indeterminate. Right Ventricle: The right ventricular size is normal. Right vetricular wall thickness was not well visualized. Right ventricular systolic function is normal. Left Atrium: Left atrial size was  moderately dilated. Right Atrium: Right atrial size was moderately dilated. Pericardium: There is no evidence of pericardial effusion. Mitral Valve: The mitral valve is normal in structure. Trivial mitral valve regurgitation. No evidence of mitral valve stenosis. Tricuspid Valve: The tricuspid valve is normal in structure. Tricuspid valve regurgitation is not demonstrated. No evidence of tricuspid stenosis. Aortic Valve: The aortic valve is tricuspid. Aortic valve regurgitation is trivial. Aortic valve sclerosis/calcification is present, without any evidence of aortic stenosis. Aortic valve mean gradient measures 4.0 mmHg. Aortic valve peak gradient measures 7.1 mmHg. Aortic valve area, by VTI measures 1.91 cm. Pulmonic Valve: The pulmonic valve was not well visualized. Pulmonic valve regurgitation is not visualized. No evidence of pulmonic stenosis. Aorta: The ascending aorta was not well visualized and the aortic root is normal in size and structure. IAS/Shunts: No atrial level shunt detected by color flow Doppler.  LEFT VENTRICLE PLAX 2D LVIDd:         4.00 cm   Diastology LVIDs:         3.00 cm   LV e' medial:    10.30 cm/s LV PW:         1.00 cm   LV E/e' medial:  9.4 LV IVS:        1.30 cm   LV e' lateral:   12.60 cm/s LVOT diam:     1.80 cm   LV E/e' lateral:  7.7 LV SV:         49 LV SV Index:   26 LVOT Area:     2.54 cm  RIGHT VENTRICLE             IVC RV S prime:     10.60 cm/s  IVC diam: 1.70 cm TAPSE (M-mode): 1.9 cm LEFT ATRIUM             Index        RIGHT ATRIUM           Index LA diam:        3.80 cm 2.04 cm/m   RA Area:     13.45 cm LA Vol (A2C):   54.9 ml 29.42 ml/m  RA Volume:   25.45 ml  13.64 ml/m LA Vol (A4C):   56.0 ml 30.01 ml/m LA Biplane Vol: 56.5 ml 30.27 ml/m  AORTIC VALVE AV Area (Vmax):    1.96 cm AV Area (Vmean):   1.84 cm AV Area (VTI):     1.91 cm AV Vmax:           133.33 cm/s AV Vmean:          91.667 cm/s AV VTI:            0.255 m AV Peak Grad:      7.1 mmHg AV Mean Grad:      4.0 mmHg LVOT Vmax:         102.70 cm/s LVOT Vmean:        66.200 cm/s LVOT VTI:          0.192 m LVOT/AV VTI ratio: 0.75  AORTA Ao Root diam: 2.70 cm Ao Asc diam:  2.50 cm MV E velocity: 96.80 cm/s  TRICUSPID VALVE                            TR Peak grad:   23.6 mmHg  TR Vmax:        243.00 cm/s                             SHUNTS                            Systemic VTI:  0.19 m                            Systemic Diam: 1.80 cm Rudean Haskell MD Electronically signed by Rudean Haskell MD Signature Date/Time: 06/05/2022/1:39:04 PM    Final     Scheduled Meds:  feeding supplement (PROSource TF20)  60 mL Per Tube Daily   metoprolol tartrate  5 mg Intravenous Q6H   Continuous Infusions:  ceFEPime (MAXIPIME) IV 2 g (06/07/22 0546)   feeding supplement (JEVITY 1.2 CAL) 1,000 mL (06/07/22 0433)   heparin 1,000 Units/hr (06/07/22 0550)   potassium chloride 10 mEq (06/07/22 1144)   potassium PHOSPHATE IVPB (in mmol) 30 mmol (06/07/22 1048)     LOS: 3 days   Darliss Cheney, MD [facility]  06/07/2022, 12:14 PM   *Please note that this is a verbal dictation therefore any spelling or grammatical errors are due to the "[employer]" system interpretation.  Please page via [place] and do not message via  secure chat for urgent patient care matters. Secure chat can be used for non urgent patient care matters.  How to contact the [facility] Attending or Consulting provider [place] or covering provider during after hours [place], for this patient?  Check the care team in [facility] and look for a) attending/consulting TR[facility] provider listed and b) the [facility] team listed. Page or secure chat [phone]. Log into [email or web address] and use [place]'s universal password to access. If you do not have the password, please contact the hospital operator. Locate the [facility] provider you are looking for under [facility] and page to a number that you can be directly reached. If you still have difficulty reaching the provider, please page the [facility] (Director on Call) for the Hospitalists listed on amion for assistance.

## 2022-06-08 DIAGNOSIS — J189 Pneumonia, unspecified organism: Secondary | ICD-10-CM | POA: Diagnosis not present

## 2022-06-08 DIAGNOSIS — A419 Sepsis, unspecified organism: Secondary | ICD-10-CM | POA: Diagnosis not present

## 2022-06-08 LAB — CBC
HCT: 41 % (ref 36.0–46.0)
Hemoglobin: 13.9 g/dL (ref 12.0–15.0)
MCH: 28.6 pg (ref 26.0–34.0)
MCHC: 33.9 g/dL (ref 30.0–36.0)
MCV: 84.4 fL (ref 80.0–100.0)
Platelets: 151 10*3/uL (ref 150–400)
RBC: 4.86 MIL/uL (ref 3.87–5.11)
RDW: 14.4 % (ref 11.5–15.5)
WBC: 7.5 10*3/uL (ref 4.0–10.5)
nRBC: 0 % (ref 0.0–0.2)

## 2022-06-08 LAB — GLUCOSE, CAPILLARY
Glucose-Capillary: 148 mg/dL — ABNORMAL HIGH (ref 70–99)
Glucose-Capillary: 168 mg/dL — ABNORMAL HIGH (ref 70–99)
Glucose-Capillary: 157 mg/dL — ABNORMAL HIGH (ref 70–99)
Glucose-Capillary: 179 mg/dL — ABNORMAL HIGH (ref 70–99)
Glucose-Capillary: 151 mg/dL — ABNORMAL HIGH (ref 70–99)

## 2022-06-08 LAB — LEGIONELLA PNEUMOPHILA SEROGP 1 UR AG: L. pneumophila Serogp 1 Ur Ag: NEGATIVE

## 2022-06-08 LAB — BASIC METABOLIC PANEL
Anion gap: 8 (ref 5–15)
BUN: 16 mg/dL (ref 8–23)
CO2: 26 mmol/L (ref 22–32)
Calcium: 8.8 mg/dL — ABNORMAL LOW (ref 8.9–10.3)
Chloride: 110 mmol/L (ref 98–111)
Creatinine, Ser: 0.83 mg/dL (ref 0.44–1.00)
GFR, Estimated: >60 mL/min (ref >60)
Glucose, Bld: 172 mg/dL — ABNORMAL HIGH (ref 70–99)
Potassium: 3.3 mmol/L — ABNORMAL LOW (ref 3.5–5.1)
Sodium: 144 mmol/L (ref 135–145)

## 2022-06-08 LAB — MAGNESIUM: Magnesium: 1.9 mg/dL (ref 1.7–2.4)

## 2022-06-08 LAB — HEPARIN LEVEL (UNFRACTIONATED)
Heparin Unfractionated: 0.43 IU/mL (ref 0.30–0.70)
Heparin Unfractionated: 0.61 IU/mL (ref 0.30–0.70)

## 2022-06-08 LAB — APTT
aPTT: 62 seconds — ABNORMAL HIGH (ref 24–36)
aPTT: 94 seconds — ABNORMAL HIGH (ref 24–36)

## 2022-06-08 MED ORDER — POTASSIUM CHLORIDE 10 MEQ/100ML IV SOLN
10.0000 meq | INTRAVENOUS | Status: AC
  Administered 2022-06-08 (×4): 10 meq via INTRAVENOUS
  Filled 2022-06-08 (×3): qty 100

## 2022-06-08 MED ORDER — POLYVINYL ALCOHOL 1.4 % OP SOLN
1.0000 [drp] | OPHTHALMIC | Status: DC | PRN
  Administered 2022-06-08 – 2022-06-11 (×3): 1 [drp] via OPHTHALMIC
  Filled 2022-06-08: qty 15

## 2022-06-08 NOTE — Progress Notes (Addendum)
  Transition of Care [facility]) Screening Note   Patient Details  Name: Wilda Wetherell Date of Birth: 1950/11/22   Transition of Care [facility]) CM/SW Contact:    Benard Halsted, LCSW Phone Number: 06/08/2022, 8:26 AM    Transition of Care Department [facility]) has reviewed patient from [facility] where she was completing rehab. She would need insurance approval for return. [employer] closed today. We will continue to monitor patient advancement through interdisciplinary progression rounds. If new patient transition needs arise, please place a TOC consult.

## 2022-06-08 NOTE — Progress Notes (Signed)
PROGRESS NOTE    Tara [name]  ERD:[id number] DOB: [birth year] DOA: 06/04/2022 PCP: Biagio Borg, MD   Brief Narrative:  HPI: This is a 71 year old female nursing home resident with past medical history of CKD, depression, sarcoid, dyslipidemia, and hypertension.  Today the patient was noted to have decreased level of consciousness, 911 was called.   Patient brought to the ER, patient hypoxic into the 80s on room air.  Chest x-ray shows pneumonia.  She is currently on 4 L oxygen sats in the high 90s.  Patient remains lethargic and poorly responsive.  Patient does open her eyes when stimulated, she follows instructions moving extremities once asked.  She however will not talk and provides no history.  Patient is hemodynamically stable.  Assessment & Plan:   Principal Problem:   Sepsis due to pneumonia [facility]) Active Problems:   Sarcoidosis   Hyperlipidemia   Essential hypertension   Elevated troponin   Obstructive sleep apnea   Lactic acidosis   Acute kidney injury superimposed on chronic kidney disease (HCC)   Acute liver failure   Hypernatremia   Dehydration   Anxiety and depression   Paroxysmal atrial fibrillation with RVR (HCC)  Severe sepsis and acute respiratory failure with hypoxia due to community-acquired pneumonia [facility]): Patient meets criteria for severe sepsis based on fever 102, tachycardia, tachypnea, leukocytosis, acute hypoxic respiratory failure and lactic acid of 2.3.  Chest x-ray suggest pneumonia/aspiration pneumonitis.  Continue cefepime for total of 5 days.  Patient currently on room air.  All cultures are negative so far.   Acute kidney injury superimposed on chronic kidney disease 3: Baseline creatinine appears to be around 1.2, presented with 1.86.  Now improved.  Hypokalemia: Low again today.  Will replace.   Acute liver failure ruled out: Patient has very minimally elevated LFTs, does not meet criteria for acute liver failure however elevated LFTs could  very well be due to sepsis.  Now improving.  Will repeat every other day.     Elevated troponin: Patient had elevated but essentially flat troponin with she is not having any chest pain or shortness of breath, likely demand ischemia.  Transthoracic echo rule out wall motion abnormality, 55% ejection fraction.  This is demand ischemia.     Atrial fibrillation with RVR: Has history of paroxysmal atrial fibrillation, was in RVR, likely in the setting of sepsis.  Patient very weak.  Rates controlled.  Patient failed swallowing, she is a strict n.p.o. getting tube feedings through core track, we will continue her on heparin drip until she is able to take p.o.    Hypernatremia: Improved.  144 today.    Essential hypertension/CAD -Unable to take p.o. hydralazine, Imdur, Tranxene, Cardizem on hold -Schedule metoprolol with hold parameters.  Blood pressure controlled.    Obstructive sleep apnea -Patient protecting airways however, too ill for CPAP.    Hyperlipidemia -n.p.o.  Sarcoidosis  DVT prophylaxis: SCDs Start: 06/04/22 2221 heparin   Code Status: Full Code  Family Communication:  None present at bedside.  She says that she has no family around.  Her only sister lives in [place].  Status is: Inpatient Remains inpatient appropriate because: Patient very sick and septic and unable to swallow.   Estimated body mass index is 26.15 kg/m as calculated from the following:   Height as of this encounter: '5\' 6"'$  (1.676 m).   Weight as of this encounter: 73.5 kg.    Nutritional Assessment: Body mass index is 26.15 kg/m.Marland Kitchen Seen by dietician.  I  agree with the assessment and plan as outlined below: Nutrition Status: Nutrition Problem: Inadequate oral intake Etiology: inability to eat Signs/Symptoms: NPO status Interventions: Tube feeding  . Skin Assessment: I have examined the patient's skin and I agree with the wound assessment as performed by the wound care RN as outlined below:     Consultants:  None  Procedures:  None  Antimicrobials:  Anti-infectives (From admission, onward)    Start     Dose/Rate Route Frequency Ordered Stop   06/06/22 1730  ceFEPIme (MAXIPIME) 2 g in sodium chloride 0.9 % 100 mL IVPB        2 g 200 mL/hr over 30 Minutes Intravenous Every 12 hours 06/06/22 1627     06/05/22 2200  vancomycin (VANCOREADY) IVPB 750 mg/150 mL  Status:  Discontinued        750 mg 150 mL/hr over 60 Minutes Intravenous Every 24 hours 06/04/22 2328 06/05/22 1019   06/04/22 2330  ceFEPIme (MAXIPIME) 2 g in sodium chloride 0.9 % 100 mL IVPB  Status:  Discontinued        2 g 200 mL/hr over 30 Minutes Intravenous Every 24 hours 06/04/22 2324 06/06/22 1627   06/04/22 2330  vancomycin (VANCOREADY) IVPB 1500 mg/300 mL        1,500 mg 150 mL/hr over 120 Minutes Intravenous  Once 06/04/22 2324 06/05/22 0233   06/04/22 2230  cefTRIAXone (ROCEPHIN) 2 g in sodium chloride 0.9 % 100 mL IVPB  Status:  Discontinued        2 g 200 mL/hr over 30 Minutes Intravenous Every 24 hours 06/04/22 2223 06/04/22 2224   06/04/22 2230  azithromycin (ZITHROMAX) 500 mg in sodium chloride 0.9 % 250 mL IVPB  Status:  Discontinued        500 mg 250 mL/hr over 60 Minutes Intravenous Every 24 hours 06/04/22 2223 06/04/22 2224   06/04/22 1730  cefTRIAXone (ROCEPHIN) 2 g in sodium chloride 0.9 % 100 mL IVPB  Status:  Discontinued        2 g 200 mL/hr over 30 Minutes Intravenous Every 24 hours 06/04/22 1725 06/04/22 2321   06/04/22 1730  azithromycin (ZITHROMAX) 500 mg in sodium chloride 0.9 % 250 mL IVPB  Status:  Discontinued        500 mg 250 mL/hr over 60 Minutes Intravenous Every 24 hours 06/04/22 1725 06/04/22 2321         Subjective:  Patient seen and examined.  No complaints.  Objective: Vitals:   06/08/22 0400 06/08/22 0500 06/08/22 0545 06/08/22 0739  BP: 124/81  (!) 148/76 (!) 155/88  Pulse: 91  90 67  Resp: '13  14 14  '$ Temp:    98.3 F (36.8 C)  TempSrc:    Axillary   SpO2: 95%  95% 98%  Weight:  73.5 kg    Height:        Intake/Output Summary (Last 24 hours) at 06/08/2022 1050 Last data filed at 06/08/2022 0936 Gross per 24 hour  Intake 1435.29 ml  Output 800 ml  Net 635.29 ml    Filed Weights   06/06/22 1313 06/07/22 0400 06/08/22 0500  Weight: 73.8 kg 74.1 kg 73.5 kg    Examination:  General exam: Appears calm and comfortable  Respiratory system: Clear to auscultation. Respiratory effort normal. Cardiovascular system: S1 & S2 heard, RRR. No JVD, murmurs, rubs, gallops or clicks. No pedal edema. Gastrointestinal system: Abdomen is nondistended, soft and nontender. No organomegaly or masses felt. Normal bowel sounds heard.  Central nervous system: Alert and oriented. No focal neurological deficits. Extremities: Symmetric 5 x 5 power. Skin: No rashes, lesions or ulcers.  Psychiatry: Judgement and insight appear normal. Mood & affect appropriate.   Data Reviewed: I have personally reviewed following labs and imaging studies  CBC: Recent Labs  Lab 06/04/22 1703 06/04/22 1725 06/05/22 0152 06/06/22 0644 06/07/22 0442 06/08/22 0229  WBC 22.2*  --  20.7* 11.2* 10.6* 7.5  NEUTROABS 19.7*  --  17.5*  --   --   --   HGB 16.7* 16.7* 12.8 13.8 14.6 13.9  HCT 51.3* 49.0* 39.0 41.8 44.3 41.0  MCV 86.8  --  88.4 88.2 87.0 84.4  PLT 345  --  211 180 183 099    Basic Metabolic Panel: Recent Labs  Lab 06/04/22 1830 06/04/22 1830 06/05/22 0152 06/06/22 0644 06/06/22 1412 06/06/22 1817 06/07/22 0442 06/07/22 1631 06/08/22 0928  NA 149*  --  147* 145  --   --  145  --  144  K 3.5  --  3.6 3.2*  --   --  3.0* 3.2* 3.3*  CL 108  --  107 107  --   --  107  --  110  CO2 25  --  25 25  --   --  26  --  26  GLUCOSE 116*  --  121* 68*  --   --  122*  --  172*  BUN 30*  --  28* 16  --   --  16  --  16  CREATININE 1.86*  --  1.54* 0.91  --   --  0.85  --  0.83  CALCIUM 9.9  --  9.3 9.0  --   --  9.3  --  8.8*  MG  --    < > 1.9 1.9 1.9 2.1  1.8 1.8 1.9  PHOS  --   --   --   --  2.7 2.6 1.9* 3.0  --    < > = values in this interval not displayed.    GFR: Estimated Creatinine Clearance: 65.6 mL/min (by C-G formula based on SCr of 0.83 mg/dL). Liver Function Tests: Recent Labs  Lab 06/04/22 1830 06/06/22 0644 06/07/22 0442  AST 61* 46* 37  ALT 58* 54* 45*  ALKPHOS 74 63 75  BILITOT 1.0 0.9 0.7  PROT 6.3* 5.9* 6.0*  ALBUMIN 3.2* 2.7* 2.8*    No results for input(s): "LIPASE", "AMYLASE" in the last 168 hours. Recent Labs  Lab 06/04/22 1703  AMMONIA 33    Coagulation Profile: Recent Labs  Lab 06/04/22 1703  INR 1.6*    Cardiac Enzymes: No results for input(s): "CKTOTAL", "CKMB", "CKMBINDEX", "TROPONINI" in the last 168 hours. BNP (last 3 results) No results for input(s): "PROBNP" in the last 8760 hours. HbA1C: No results for input(s): "HGBA1C" in the last 72 hours. CBG: Recent Labs  Lab 06/07/22 1549 06/07/22 2050 06/07/22 2338 06/08/22 0401 06/08/22 0743  GLUCAP 145* 150* 136* 148* 168*    Lipid Profile: No results for input(s): "CHOL", "HDL", "LDLCALC", "TRIG", "CHOLHDL", "LDLDIRECT" in the last 72 hours. Thyroid Function Tests: No results for input(s): "TSH", "T4TOTAL", "FREET4", "T3FREE", "THYROIDAB" in the last 72 hours.  Anemia Panel: No results for input(s): "VITAMINB12", "FOLATE", "FERRITIN", "TIBC", "IRON", "RETICCTPCT" in the last 72 hours. Sepsis Labs: Recent Labs  Lab 06/04/22 1703 06/04/22 1853 06/05/22 0038  LATICACIDVEN 2.2* 2.3* 1.5     Recent Results (from the past 240 hour(s))  Blood Culture (routine x 2)     Status: None (Preliminary result)   Collection Time: 06/04/22  5:03 PM   Specimen: BLOOD  Result Value Ref Range Status   Specimen Description BLOOD SITE NOT SPECIFIED  Final   Special Requests   Final    BOTTLES DRAWN AEROBIC AND ANAEROBIC Blood Culture adequate volume   Culture   Final    NO GROWTH 4 DAYS Performed at [facility], [address] [place], [place] [address]    Report Status PENDING  Incomplete  Resp Panel by RT-PCR (Flu A&B, Covid) Anterior Nasal Swab     Status: None   Collection Time: 06/04/22  5:16 PM   Specimen: Anterior Nasal Swab  Result Value Ref Range Status   SARS Coronavirus 2 by RT PCR NEGATIVE NEGATIVE Final    Comment: (NOTE) SARS-CoV-2 target nucleic acids are NOT DETECTED.  The SARS-CoV-2 RNA is generally detectable in upper respiratory specimens during the acute phase of infection. The lowest concentration of SARS-CoV-2 viral copies this assay can detect is 138 copies/mL. A negative result does not preclude SARS-Cov-2 infection and should not be used as the sole basis for treatment or other patient management decisions. A negative result may occur with  improper specimen collection/handling, submission of specimen other than nasopharyngeal swab, presence of viral mutation(s) within the areas targeted by this assay, and inadequate number of viral copies(<138 copies/mL). A negative result must be combined with clinical observations, patient history, and epidemiological information. The expected result is Negative.  Fact Sheet for Patients:  EntrepreneurPulse.com.au  Fact Sheet for Healthcare Providers:  IncredibleEmployment.be  This test is no t yet approved or cleared by the [place] FDA and  has been authorized for detection and/or diagnosis of SARS-CoV-2 by FDA under an Emergency Use Authorization (EUA). This EUA will remain  in effect (meaning this test can be used) for the duration of the COVID-19 declaration under Section 564(b)(1) of the Act, 21 U.S.C.section 360bbb-3(b)(1), unless the authorization is terminated  or revoked sooner.       Influenza A by PCR NEGATIVE NEGATIVE Final   Influenza B by PCR NEGATIVE NEGATIVE Final    Comment: (NOTE) The Xpert Xpress SARS-CoV-2/FLU/RSV plus assay is intended as an aid in the diagnosis of influenza  from Nasopharyngeal swab specimens and should not be used as a sole basis for treatment. Nasal washings and aspirates are unacceptable for Xpert Xpress SARS-CoV-2/FLU/RSV testing.  Fact Sheet for Patients: EntrepreneurPulse.com.au  Fact Sheet for Healthcare Providers: IncredibleEmployment.be  This test is not yet approved or cleared by the [place] FDA and has been authorized for detection and/or diagnosis of SARS-CoV-2 by FDA under an Emergency Use Authorization (EUA). This EUA will remain in effect (meaning this test can be used) for the duration of the COVID-19 declaration under Section 564(b)(1) of the Act, 21 U.S.C. section 360bbb-3(b)(1), unless the authorization is terminated or revoked.  Performed at [facility], [place] [address] [place], [place] [address]   Urine Culture     Status: Abnormal   Collection Time: 06/04/22 10:00 PM   Specimen: Urine, Clean Catch  Result Value Ref Range Status   Specimen Description URINE, CLEAN CATCH  Final   Special Requests   Final    NONE Performed at [facility], [place] [address], [place], [place] [address]    Culture MULTIPLE SPECIES PRESENT, SUGGEST RECOLLECTION (A)  Final   Report Status 06/06/2022 FINAL  Final  MRSA  Next Gen by PCR, Nasal     Status: None   Collection Time: 06/05/22 12:30 AM   Specimen: Nasal Mucosa; Nasal Swab  Result Value Ref Range Status   MRSA by PCR Next Gen NOT DETECTED NOT DETECTED Final    Comment: (NOTE) The GeneXpert MRSA Assay (FDA approved for NASAL specimens only), is one component of a comprehensive MRSA colonization surveillance program. It is not intended to diagnose MRSA infection nor to guide or monitor treatment for MRSA infections. Test performance is not FDA approved in patients less than 44 years old. Performed at [facility], [place] [address] [place], [place] [address]      Radiology Studies: DG Abd Portable 1V  Result  Date: 06/06/2022 CLINICAL DATA:  Encounter for feeding tube placement. EXAM: PORTABLE ABDOMEN - 1 VIEW COMPARISON:  None Available. FINDINGS: The bowel gas pattern is normal. Weighted tip feeding tube with distal tip projecting over the pyloric region. No radio-opaque calculi or other significant radiographic abnormality are seen. IMPRESSION: Weighted tip feeding tube with distal tip projecting over the pyloric region. Electronically Signed   By: Keane Police D.O.   On: 06/06/2022 12:09    Scheduled Meds:  feeding supplement (PROSource TF20)  60 mL Per Tube Daily   metoprolol tartrate  5 mg Intravenous Q6H   Continuous Infusions:  ceFEPime (MAXIPIME) IV 2 g (06/08/22 0549)   feeding supplement (JEVITY 1.2 CAL) 60 mL/hr at 06/08/22 0600   heparin 1,100 Units/hr (06/08/22 0936)   potassium chloride       LOS: 4 days   Darliss Cheney, MD [facility]  06/08/2022, 10:50 AM   *Please note that this is a verbal dictation therefore any spelling or grammatical errors are due to the "[place] [employer]" system interpretation.  Please page via [place] and do not message via secure chat for urgent patient care matters. Secure chat can be used for non urgent patient care matters.  How to contact the [facility] Attending or Consulting provider [place] or covering provider during after hours [place], for this patient?  Check the care team in [facility] and look for a) attending/consulting TR[facility] provider listed and b) the [facility] team listed. Page or secure chat [phone]. Log into [email or web address] and use [place]'s universal password to access. If you do not have the password, please contact the hospital operator. Locate the [facility] provider you are looking for under [facility] and page to a number that you can be directly reached. If you still have difficulty reaching the provider, please page the [facility] (Director on Call) for the Hospitalists listed on amion for assistance.

## 2022-06-08 NOTE — Evaluation (Signed)
Physical Therapy Evaluation Patient Details Name: Tara [name] MRN: [id number] DOB: 1951/06/14 Today's Date: 06/08/2022  History of Present Illness  Pt is a 71 y.o. female admitted from [facility] with decreased responsiveness and not eating or drinking. Pt with multiple recent admission for weakness and fall. Patient on recent cardiology follow up was  placed on zio patch on 03/26/22 for 2 weeks. Patient  since 9/11 has had 3 ED visit for non-intractable HA,last of which was 9/16 for which she was evaluated ,treated and discharged home in improve condition. PMH significant for CKD, sarcoidosis, HTN, obesity,OSA on CPAP, atrial fibrillation on Eliquis, hx of presyncope seen in ed 3 x since 8/29.  Clinical Impression  Patient presents with decreased mobility due to generalized weakness, decreased cardiorespiratory endurance with HR up to 174 with mobility up to chair, decreased balance, decreased safety and decreased activity tolerance.  Currently mod A up to EOB with HOB elevated and max A to stand and very slowly step to turn and have chair brought up to sit.  She reports had been ambulatory a little with therapy while at [facility] and was independent prior to illness previous to rehab stay.  Feel she will benefit from skilled PT in the acute setting and will need continued SNF level rehab though prefers new facility.  She reports SW aware.        Recommendations for follow up therapy are one component of a multi-disciplinary discharge planning process, led by the attending physician.  Recommendations may be updated based on patient status, additional functional criteria and insurance authorization.  Follow Up Recommendations Skilled nursing-short term rehab (<3 hours/day) Can patient physically be transported by private vehicle: No    Assistance Recommended at Discharge Frequent or constant Supervision/Assistance  Patient can return home with the following  Two people to help with walking  and/or transfers;Assist for transportation;A lot of help with bathing/dressing/bathroom;Help with stairs or ramp for entrance    Equipment Recommendations None recommended by PT  Recommendations for Other Services       Functional Status Assessment Patient has had a recent decline in their functional status and demonstrates the ability to make significant improvements in function in a reasonable and predictable amount of time.     Precautions / Restrictions Precautions Precautions: Fall Precaution Comments: watch HR and BP      Mobility  Bed Mobility Overal bed mobility: Needs Assistance Bed Mobility: Supine to Sit     Supine to sit: Mod assist, HOB elevated     General bed mobility comments: assist for lifting trunk and scooting to EOB    Transfers Overall transfer level: Needs assistance Equipment used: Rolling walker (2 wheels) Transfers: Sit to/from Stand, Bed to chair/wheelchair/BSC Sit to Stand: Max assist, From elevated surface   Step pivot transfers: Max assist       General transfer comment: increased time to engage hip and trunk extensors with heavy lifting help to stand from elevated surface; stepping with assist and cues for weight shift and moving feet then brought chair up for pt to sit; HR up to 174 bpm    Ambulation/Gait                  Stairs            Wheelchair Mobility    Modified Rankin (Stroke Patients Only)       Balance Overall balance assessment: Needs assistance Sitting-balance support: Feet supported Sitting balance-Leahy Scale: Fair Sitting balance - Comments: assist until  at EOB with feet supported   Standing balance support: Bilateral upper extremity supported Standing balance-Leahy Scale: Poor Standing balance comment: UE support and mod A for balance                             Pertinent Vitals/Pain Pain Assessment Faces Pain Scale: No hurt Pain Location: just describing fatigue    Home  Living Family/patient expects to be discharged to:: Skilled nursing facility                        Prior Function Prior Level of Function : Needs assist             Mobility Comments: Recently at [facility] where she received assistance for ADL and mobility.       Hand Dominance   Dominant Hand: Right    Extremity/Trunk Assessment   Upper Extremity Assessment Upper Extremity Assessment: Defer to OT evaluation    Lower Extremity Assessment Lower Extremity Assessment: LLE deficits/detail;RLE deficits/detail RLE Deficits / Details: AAROM WFL, strength hip flexion 3-/5, knee extension 4-/5, ankle DF 4/5 RLE Sensation: history of peripheral neuropathy LLE Deficits / Details: AAROM WFL, strength hip flexion 3-/5, knee extension 4-/5, ankle DF 4/5 LLE Sensation: history of peripheral neuropathy       Communication   Communication: No difficulties  Cognition Arousal/Alertness: Awake/alert Behavior During Therapy: WFL for tasks assessed/performed Overall Cognitive Status: No family/caregiver present to determine baseline cognitive functioning                                 General Comments: describes disatisfaction with therapy at [facility], but not thrilled about getting up with therapy today giving many reasons such as purewick and coretrack hooked to feeding.  discussed importance of getting up due to pneumonia        General Comments General comments (skin integrity, edema, etc.): HR up to 174 while up getting to chair, down to 120's at rest, RN in the room    Exercises     Assessment/Plan    PT Assessment Patient needs continued PT services  PT Problem List Decreased strength;Decreased balance;Decreased knowledge of precautions;Cardiopulmonary status limiting activity;Decreased mobility;Decreased activity tolerance;Decreased safety awareness       PT Treatment Interventions DME instruction;Functional mobility training;Patient/family  education;Therapeutic activities;Therapeutic exercise;Cognitive remediation;Balance training;Wheelchair mobility training    PT Goals (Current goals can be found in the Care Plan section)  Acute Rehab PT Goals Patient Stated Goal: to find new rehab facility PT Goal Formulation: With patient Time For Goal Achievement: 06/22/22 Potential to Achieve Goals: Fair    Frequency Min 2X/week     Co-evaluation               AM-PAC PT "6 Clicks" Mobility  Outcome Measure Help needed turning from your back to your side while in a flat bed without using bedrails?: A Lot Help needed moving from lying on your back to sitting on the side of a flat bed without using bedrails?: A Lot Help needed moving to and from a bed to a chair (including a wheelchair)?: Total Help needed standing up from a chair using your arms (e.g., wheelchair or bedside chair)?: Total Help needed to walk in hospital room?: Total Help needed climbing 3-5 steps with a railing? : Total 6 Click Score: 8    End of Session Equipment  Utilized During Treatment: Gait belt Activity Tolerance: Patient limited by fatigue;Treatment limited secondary to medical complications (Comment) (tachycardia) Patient left: in chair;with call bell/phone within reach;with chair alarm set Nurse Communication: Mobility status;Need for lift equipment PT Visit Diagnosis: Other abnormalities of gait and mobility (R26.89);Muscle weakness (generalized) (M62.81)    Time: 6886-4847 PT Time Calculation (min) (ACUTE ONLY): 38 min   Charges:   PT Evaluation $PT Eval Moderate Complexity: 1 Mod PT Treatments $Therapeutic Activity: 8-22 mins        Magda Kiel, PT Acute Rehabilitation Services Office:[phone] 06/08/2022   Reginia Naas 06/08/2022, 1:27 PM

## 2022-06-08 NOTE — Progress Notes (Signed)
ANTICOAGULATION CONSULT NOTE  Pharmacy Consult for Heparin Indication: atrial fibrillation  Allergies  Allergen Reactions   Fluoxetine Hcl Other (See Comments)    (PROZAC) Suicidal thoughts   Sulfa Antibiotics Rash   Chocolate Other (See Comments)    SOMETIMES CAUSES SEVERE HEADACHES   Floxin [Ocuflox] Anxiety    shaky   Other Nausea And Vomiting    INSTANT ICED TEA PACKETS   Sulfonamide Derivatives Rash    Patient Measurements: Height: '5\' 6"'$  (167.6 cm) Weight: 73.5 kg (162 lb 0.6 oz) IBW/kg (Calculated) : 59.3 Heparin Dosing Weight: 75 kg  Vital Signs: Temp: 98.3 F (36.8 C) (11/24 0739) Temp Source: Axillary (11/24 0739) BP: 155/88 (11/24 0739) Pulse Rate: 67 (11/24 0739)  Labs: Recent Labs    06/06/22 0644 06/07/22 0442 06/08/22 0229  HGB 13.8 14.6 13.9  HCT 41.8 44.3 41.0  PLT 180 183 151  APTT 85* 73* 62*  HEPARINUNFRC >1.10* 0.75* 0.43  CREATININE 0.91 0.85  --      Estimated Creatinine Clearance: 64.1 mL/min (by C-G formula based on SCr of 0.85 mg/dL).     Assessment: 71 y.o. female presenting with AMS, in Afib w RVR started on IV heparin, on Eliquis PTA. Last reported dose the morning of 11/20. Pharmacy consulted to dose heparin.    This morning aPTT is slightly SUBtherapeutic at 62 on 1000 units/hr. Hgb 13.9 and plt 151. No infusion or bleeding issues noted. Will continue to use aPTTs to monitor heparin until HL correlates with aPTT.    Goal of Therapy:  aPTT 66-102 seconds Heparin level 0.3-0.7 units/ml Monitor platelets by anticoagulation protocol: Yes   Plan:  Increase heparin gtt to 1100 units/hr Check 6-8 hour HL Daily aPTT/HL, CBC, s/s bleeding F/u ability to transition back to PO    Thank you for allowing [place] to participate in this patients care. Jens Som, PharmD 06/08/2022 8:30 AM  **Pharmacist phone directory can be found on [email or web address] listed under [place]**
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
## 2022-06-08 NOTE — Progress Notes (Signed)
ANTICOAGULATION CONSULT NOTE- follow-up  Pharmacy Consult for Heparin Indication: atrial fibrillation  Allergies  Allergen Reactions   Fluoxetine Hcl Other (See Comments)    (PROZAC) Suicidal thoughts   Sulfa Antibiotics Rash   Chocolate Other (See Comments)    SOMETIMES CAUSES SEVERE HEADACHES   Floxin [Ocuflox] Anxiety    shaky   Other Nausea And Vomiting    INSTANT ICED TEA PACKETS   Sulfonamide Derivatives Rash    Patient Measurements: Height: '5\' 6"'$  (167.6 cm) Weight: 73.5 kg (162 lb 0.6 oz) IBW/kg (Calculated) : 59.3 Heparin Dosing Weight: 75 kg  Vital Signs: Temp: 98.7 F (37.1 C) (11/24 1549) Temp Source: Axillary (11/24 1549) BP: 154/99 (11/24 1549) Pulse Rate: 77 (11/24 1549)  Labs: Recent Labs    06/06/22 0644 06/07/22 0442 06/08/22 0229 06/08/22 0928 06/08/22 1725  HGB 13.8 14.6 13.9  --   --   HCT 41.8 44.3 41.0  --   --   PLT 180 183 151  --   --   APTT 85* 73* 62*  --  94*  HEPARINUNFRC >1.10* 0.75* 0.43  --  0.61  CREATININE 0.91 0.85  --  0.83  --      Estimated Creatinine Clearance: 65.6 mL/min (by C-G formula based on SCr of 0.83 mg/dL).     Assessment: 71 y.o. female presenting with AMS, in Afib w RVR started on IV heparin, on Eliquis PTA. Last reported dose the morning of 11/20. Pharmacy consulted to dose heparin.    This morning aPTT is slightly SUBtherapeutic at 62 on 1000 units/hr. Hgb 13.9 and plt 151. No infusion or bleeding issues noted. Will continue to use aPTTs to monitor heparin until HL correlates with aPTT.    06/08/2022 PM update: aPTT: 94 seconds HL: 0.61 No signs of bleeding No issues with infusion  Goal of Therapy:  aPTT 66-102 seconds Heparin level 0.3-0.7 units/ml Monitor platelets by anticoagulation protocol: Yes   Plan:  Continue heparin gtt at 1100 units/hr Daily HL, CBC, s/s bleeding. HL and aPTT appear to correlate at this time F/u ability to transition back to PO  Thank you for allowing [place] to  participate in this patients care.   Vaughan Basta BS, PharmD, BCPS Clinical Pharmacist 06/08/2022 5:58 PM  Contact: [phone] after 3 PM  "Be curious, not judgmental..." -Jamal Maes

## 2022-06-09 ENCOUNTER — Other Ambulatory Visit (HOSPITAL_COMMUNITY): Payer: Self-pay | Admitting: Physician Assistant

## 2022-06-09 DIAGNOSIS — A419 Sepsis, unspecified organism: Secondary | ICD-10-CM | POA: Diagnosis not present

## 2022-06-09 DIAGNOSIS — J189 Pneumonia, unspecified organism: Secondary | ICD-10-CM | POA: Diagnosis not present

## 2022-06-09 LAB — BASIC METABOLIC PANEL
Anion gap: 10 (ref 5–15)
BUN: 20 mg/dL (ref 8–23)
CO2: 26 mmol/L (ref 22–32)
Calcium: 9.1 mg/dL (ref 8.9–10.3)
Chloride: 107 mmol/L (ref 98–111)
Creatinine, Ser: 0.85 mg/dL (ref 0.44–1.00)
GFR, Estimated: >60 mL/min (ref >60)
Glucose, Bld: 160 mg/dL — ABNORMAL HIGH (ref 70–99)
Potassium: 4.2 mmol/L (ref 3.5–5.1)
Sodium: 143 mmol/L (ref 135–145)

## 2022-06-09 LAB — GLUCOSE, CAPILLARY
Glucose-Capillary: 114 mg/dL — ABNORMAL HIGH (ref 70–99)
Glucose-Capillary: 132 mg/dL — ABNORMAL HIGH (ref 70–99)
Glucose-Capillary: 133 mg/dL — ABNORMAL HIGH (ref 70–99)
Glucose-Capillary: 135 mg/dL — ABNORMAL HIGH (ref 70–99)
Glucose-Capillary: 144 mg/dL — ABNORMAL HIGH (ref 70–99)
Glucose-Capillary: 149 mg/dL — ABNORMAL HIGH (ref 70–99)
Glucose-Capillary: 153 mg/dL — ABNORMAL HIGH (ref 70–99)
Glucose-Capillary: 155 mg/dL — ABNORMAL HIGH (ref 70–99)

## 2022-06-09 LAB — CBC
HCT: 39.2 % (ref 36.0–46.0)
Hemoglobin: 13.1 g/dL (ref 12.0–15.0)
MCH: 28.6 pg (ref 26.0–34.0)
MCHC: 33.4 g/dL (ref 30.0–36.0)
MCV: 85.6 fL (ref 80.0–100.0)
Platelets: 216 10*3/uL (ref 150–400)
RBC: 4.58 MIL/uL (ref 3.87–5.11)
RDW: 14.6 % (ref 11.5–15.5)
WBC: 6.9 10*3/uL (ref 4.0–10.5)
nRBC: 0 % (ref 0.0–0.2)

## 2022-06-09 LAB — CULTURE, BLOOD (ROUTINE X 2)
Culture: NO GROWTH
Special Requests: ADEQUATE

## 2022-06-09 LAB — HEPARIN LEVEL (UNFRACTIONATED): Heparin Unfractionated: 0.55 IU/mL (ref 0.30–0.70)

## 2022-06-09 MED ORDER — ORAL CARE MOUTH RINSE
15.0000 mL | OROMUCOSAL | Status: DC
  Administered 2022-06-09 – 2022-06-21 (×52): 15 mL via OROMUCOSAL

## 2022-06-09 MED ORDER — ALPRAZOLAM 0.25 MG PO TABS
0.2500 mg | ORAL_TABLET | Freq: Two times a day (BID) | ORAL | Status: DC | PRN
  Administered 2022-06-12 – 2022-06-29 (×14): 0.25 mg
  Filled 2022-06-09 (×16): qty 1

## 2022-06-09 MED ORDER — ORAL CARE MOUTH RINSE: 15.0000 mL | OROMUCOSAL | Status: DC | PRN

## 2022-06-09 NOTE — Progress Notes (Signed)
ANTICOAGULATION CONSULT NOTE- follow-up  Pharmacy Consult for Heparin Indication: atrial fibrillation  Allergies  Allergen Reactions   Fluoxetine Hcl Other (See Comments)    (PROZAC) Suicidal thoughts   Sulfa Antibiotics Rash   Chocolate Other (See Comments)    SOMETIMES CAUSES SEVERE HEADACHES   Floxin [Ocuflox] Anxiety    shaky   Other Nausea And Vomiting    INSTANT ICED TEA PACKETS   Sulfonamide Derivatives Rash    Patient Measurements: Height: '5\' 6"'$  (167.6 cm) Weight: 74.1 kg (163 lb 5.8 oz) IBW/kg (Calculated) : 59.3 Heparin Dosing Weight: 75 kg  Vital Signs: Temp: 99 F (37.2 C) (11/25 0400) Temp Source: Axillary (11/25 0400) BP: 124/69 (11/25 0523) Pulse Rate: 86 (11/25 0523)  Labs: Recent Labs    06/07/22 0442 06/08/22 0229 06/08/22 0928 06/08/22 1725 06/09/22 0303  HGB 14.6 13.9  --   --  13.1  HCT 44.3 41.0  --   --  39.2  PLT 183 151  --   --  216  APTT 73* 62*  --  94*  --   HEPARINUNFRC 0.75* 0.43  --  0.61 0.55  CREATININE 0.85  --  0.83  --   --      Estimated Creatinine Clearance: 65.8 mL/min (by C-G formula based on SCr of 0.83 mg/dL).     Assessment: 71 y.o. female presenting with AMS, in Afib w RVR started on IV heparin, on Eliquis PTA. Last reported dose the morning of 11/20. Pharmacy consulted to dose heparin.    Heparin level 0.55 on drip rate 1100 units/hr therapeutic. Continues to be therapeutic. Hgb 13.1 and plt 216. No s/sx bleeding noted in chart.     Goal of Therapy:  Heparin level 0.3-0.7 units/ml Monitor platelets by anticoagulation protocol: Yes   Plan:  Continue heparin gtt at 1100 units/hr Daily HL, CBC, s/sx bleeding F/u ability to transition back to PO apixaban   Thank you for allowing [place] to participate in this patients care.  Gena Fray, PharmD PGY1 Pharmacy Resident   06/09/2022 7:43 AM

## 2022-06-09 NOTE — Progress Notes (Signed)
Pt c/o dry eyes bilaterally. On assessment, conjunctiva red, pt denied changes in vision, pain, no drainage noted. [place] Crane, MD notified. New order placed.

## 2022-06-09 NOTE — Progress Notes (Signed)
PROGRESS NOTE    Tara [name]  UYQ:[id number] DOB: July 13, 1951 DOA: 06/04/2022 PCP: Biagio Borg, MD   Brief Narrative:  HPI: This is a 71 year old female nursing home resident with past medical history of CKD, depression, sarcoid, dyslipidemia, and hypertension.  Today the patient was noted to have decreased level of consciousness, 911 was called.   Patient brought to the ER, patient hypoxic into the 80s on room air.  Chest x-ray shows pneumonia.  She is currently on 4 L oxygen sats in the high 90s.  Patient remains lethargic and poorly responsive.  Patient does open her eyes when stimulated, she follows instructions moving extremities once asked.  She however will not talk and provides no history.  Patient is hemodynamically stable.  Assessment & Plan:   Principal Problem:   Sepsis due to pneumonia [facility]) Active Problems:   Sarcoidosis   Hyperlipidemia   Essential hypertension   Elevated troponin   Obstructive sleep apnea   Lactic acidosis   Acute kidney injury superimposed on chronic kidney disease (HCC)   Acute liver failure   Hypernatremia   Dehydration   Anxiety and depression   Paroxysmal atrial fibrillation with RVR (HCC)  Severe sepsis and acute respiratory failure with hypoxia due to community-acquired pneumonia [facility]): Patient meets criteria for severe sepsis based on fever 102, tachycardia, tachypnea, leukocytosis, acute hypoxic respiratory failure and lactic acid of 2.3.  Chest x-ray suggest pneumonia/aspiration pneumonitis.  Continue cefepime for total of 5 days.  Patient currently on room air.  All cultures are negative so far.   Acute kidney injury superimposed on chronic kidney disease 3: Baseline creatinine appears to be around 1.2, presented with 1.86.  Now improved.  Hypokalemia: Checking BMP.   Acute liver failure ruled out: Patient has very minimally elevated LFTs, does not meet criteria for acute liver failure however elevated LFTs could very well be due to  sepsis.  Now improving.  Will repeat every other day.     Elevated troponin: Patient had elevated but essentially flat troponin with she is not having any chest pain or shortness of breath, likely demand ischemia.  Transthoracic echo rule out wall motion abnormality, 55% ejection fraction.    Atrial fibrillation with RVR: Has history of paroxysmal atrial fibrillation, was in RVR, likely in the setting of sepsis.  Patient very weak.  Rates controlled.  Patient failed swallowing, she is a strict n.p.o. getting tube feedings through core track, we will continue her on heparin drip until she is able to take p.o.    Hypernatremia: Improved.      Essential hypertension/CAD -Unable to take p.o. hydralazine, Imdur, Tranxene, Cardizem on hold -Schedule metoprolol with hold parameters.  Blood pressure controlled.    Obstructive sleep apnea -Patient protecting airways however, too ill for CPAP.    Hyperlipidemia -n.p.o.  Sarcoidosis  DVT prophylaxis: SCDs Start: 06/04/22 2221 heparin   Code Status: Full Code  Family Communication:  None present at bedside.  She says that she has no family around.  Her only sister lives in [place].  Status is: Inpatient Remains inpatient appropriate because: Patient unable to swallow.   Estimated body mass index is 26.37 kg/m as calculated from the following:   Height as of this encounter: '5\' 6"'$  (1.676 m).   Weight as of this encounter: 74.1 kg.    Nutritional Assessment: Body mass index is 26.37 kg/m.Marland Kitchen Seen by dietician.  I agree with the assessment and plan as outlined below: Nutrition Status: Nutrition Problem: Inadequate oral intake  Etiology: inability to eat Signs/Symptoms: NPO status Interventions: Tube feeding  . Skin Assessment: I have examined the patient's skin and I agree with the wound assessment as performed by the wound care RN as outlined below:    Consultants:  None  Procedures:  None  Antimicrobials:  Anti-infectives (From  admission, onward)    Start     Dose/Rate Route Frequency Ordered Stop   06/06/22 1730  ceFEPIme (MAXIPIME) 2 g in sodium chloride 0.9 % 100 mL IVPB        2 g 200 mL/hr over 30 Minutes Intravenous Every 12 hours 06/06/22 1627 06/09/22 2359   06/05/22 2200  vancomycin (VANCOREADY) IVPB 750 mg/150 mL  Status:  Discontinued        750 mg 150 mL/hr over 60 Minutes Intravenous Every 24 hours 06/04/22 2328 06/05/22 1019   06/04/22 2330  ceFEPIme (MAXIPIME) 2 g in sodium chloride 0.9 % 100 mL IVPB  Status:  Discontinued        2 g 200 mL/hr over 30 Minutes Intravenous Every 24 hours 06/04/22 2324 06/06/22 1627   06/04/22 2330  vancomycin (VANCOREADY) IVPB 1500 mg/300 mL        1,500 mg 150 mL/hr over 120 Minutes Intravenous  Once 06/04/22 2324 06/05/22 0233   06/04/22 2230  cefTRIAXone (ROCEPHIN) 2 g in sodium chloride 0.9 % 100 mL IVPB  Status:  Discontinued        2 g 200 mL/hr over 30 Minutes Intravenous Every 24 hours 06/04/22 2223 06/04/22 2224   06/04/22 2230  azithromycin (ZITHROMAX) 500 mg in sodium chloride 0.9 % 250 mL IVPB  Status:  Discontinued        500 mg 250 mL/hr over 60 Minutes Intravenous Every 24 hours 06/04/22 2223 06/04/22 2224   06/04/22 1730  cefTRIAXone (ROCEPHIN) 2 g in sodium chloride 0.9 % 100 mL IVPB  Status:  Discontinued        2 g 200 mL/hr over 30 Minutes Intravenous Every 24 hours 06/04/22 1725 06/04/22 2321   06/04/22 1730  azithromycin (ZITHROMAX) 500 mg in sodium chloride 0.9 % 250 mL IVPB  Status:  Discontinued        500 mg 250 mL/hr over 60 Minutes Intravenous Every 24 hours 06/04/22 1725 06/04/22 2321         Subjective:  Seen and examined.  No new complaint.  However later on, I was informed by the nurses that she was slightly anxious, Xanax prescribed as needed.  Objective: Vitals:   06/09/22 0500 06/09/22 0523 06/09/22 0600 06/09/22 0800  BP:  124/69  (!) 133/119  Pulse:  86 70 77  Resp:  17 16 (!) 37  Temp:   97.7 F (36.5 C)    TempSrc:   Axillary   SpO2:  96% 96% 96%  Weight: 74.1 kg     Height:        Intake/Output Summary (Last 24 hours) at 06/09/2022 1150 Last data filed at 06/09/2022 0531 Gross per 24 hour  Intake 2140.06 ml  Output 1300 ml  Net 840.06 ml    Filed Weights   06/08/22 0500 06/09/22 0451 06/09/22 0500  Weight: 73.5 kg 74 kg 74.1 kg    Examination:  General exam: Appears calm and comfortable  Respiratory system: Clear to auscultation. Respiratory effort normal. Cardiovascular system: S1 & S2 heard, RRR. No JVD, murmurs, rubs, gallops or clicks. No pedal edema. Gastrointestinal system: Abdomen is nondistended, soft and nontender. No organomegaly or masses felt. Normal  bowel sounds heard. Central nervous system: Alert and oriented. No focal neurological deficits. Extremities: Symmetric 5 x 5 power. Skin: No rashes, lesions or ulcers.  Psychiatry: Judgement and insight appear normal. Mood & affect appropriate.   Data Reviewed: I have personally reviewed following labs and imaging studies  CBC: Recent Labs  Lab 06/04/22 1703 06/04/22 1725 06/05/22 0152 06/06/22 0644 06/07/22 0442 06/08/22 0229 06/09/22 0303  WBC 22.2*  --  20.7* 11.2* 10.6* 7.5 6.9  NEUTROABS 19.7*  --  17.5*  --   --   --   --   HGB 16.7*   < > 12.8 13.8 14.6 13.9 13.1  HCT 51.3*   < > 39.0 41.8 44.3 41.0 39.2  MCV 86.8  --  88.4 88.2 87.0 84.4 85.6  PLT 345  --  211 180 183 151 216   < > = values in this interval not displayed.    Basic Metabolic Panel: Recent Labs  Lab 06/04/22 1830 06/04/22 1830 06/05/22 0152 06/06/22 0644 06/06/22 1412 06/06/22 1817 06/07/22 0442 06/07/22 1631 06/08/22 0928  NA 149*  --  147* 145  --   --  145  --  144  K 3.5  --  3.6 3.2*  --   --  3.0* 3.2* 3.3*  CL 108  --  107 107  --   --  107  --  110  CO2 25  --  25 25  --   --  26  --  26  GLUCOSE 116*  --  121* 68*  --   --  122*  --  172*  BUN 30*  --  28* 16  --   --  16  --  16  CREATININE 1.86*  --  1.54*  0.91  --   --  0.85  --  0.83  CALCIUM 9.9  --  9.3 9.0  --   --  9.3  --  8.8*  MG  --    < > 1.9 1.9 1.9 2.1 1.8 1.8 1.9  PHOS  --   --   --   --  2.7 2.6 1.9* 3.0  --    < > = values in this interval not displayed.    GFR: Estimated Creatinine Clearance: 65.8 mL/min (by C-G formula based on SCr of 0.83 mg/dL). Liver Function Tests: Recent Labs  Lab 06/04/22 1830 06/06/22 0644 06/07/22 0442  AST 61* 46* 37  ALT 58* 54* 45*  ALKPHOS 74 63 75  BILITOT 1.0 0.9 0.7  PROT 6.3* 5.9* 6.0*  ALBUMIN 3.2* 2.7* 2.8*    No results for input(s): "LIPASE", "AMYLASE" in the last 168 hours. Recent Labs  Lab 06/04/22 1703  AMMONIA 33    Coagulation Profile: Recent Labs  Lab 06/04/22 1703  INR 1.6*    Cardiac Enzymes: No results for input(s): "CKTOTAL", "CKMB", "CKMBINDEX", "TROPONINI" in the last 168 hours. BNP (last 3 results) No results for input(s): "PROBNP" in the last 8760 hours. HbA1C: No results for input(s): "HGBA1C" in the last 72 hours. CBG: Recent Labs  Lab 06/08/22 2128 06/09/22 0019 06/09/22 0051 06/09/22 0450 06/09/22 0822  GLUCAP 151* 149* 155* 135* 144*    Lipid Profile: No results for input(s): "CHOL", "HDL", "LDLCALC", "TRIG", "CHOLHDL", "LDLDIRECT" in the last 72 hours. Thyroid Function Tests: No results for input(s): "TSH", "T4TOTAL", "FREET4", "T3FREE", "THYROIDAB" in the last 72 hours.  Anemia Panel: No results for input(s): "VITAMINB12", "FOLATE", "FERRITIN", "TIBC", "IRON", "RETICCTPCT" in the last 72  hours. Sepsis Labs: Recent Labs  Lab 06/04/22 1703 06/04/22 1853 06/05/22 0038  LATICACIDVEN 2.2* 2.3* 1.5     Recent Results (from the past 240 hour(s))  Blood Culture (routine x 2)     Status: None   Collection Time: 06/04/22  5:03 PM   Specimen: BLOOD  Result Value Ref Range Status   Specimen Description BLOOD SITE NOT SPECIFIED  Final   Special Requests   Final    BOTTLES DRAWN AEROBIC AND ANAEROBIC Blood Culture adequate volume    Culture   Final    NO GROWTH 5 DAYS Performed at [facility], [address], [place], [place] [address]    Report Status 06/09/2022 FINAL  Final  Resp Panel by RT-PCR (Flu A&B, Covid) Anterior Nasal Swab     Status: None   Collection Time: 06/04/22  5:16 PM   Specimen: Anterior Nasal Swab  Result Value Ref Range Status   SARS Coronavirus 2 by RT PCR NEGATIVE NEGATIVE Final    Comment: (NOTE) SARS-CoV-2 target nucleic acids are NOT DETECTED.  The SARS-CoV-2 RNA is generally detectable in upper respiratory specimens during the acute phase of infection. The lowest concentration of SARS-CoV-2 viral copies this assay can detect is 138 copies/mL. A negative result does not preclude SARS-Cov-2 infection and should not be used as the sole basis for treatment or other patient management decisions. A negative result may occur with  improper specimen collection/handling, submission of specimen other than nasopharyngeal swab, presence of viral mutation(s) within the areas targeted by this assay, and inadequate number of viral copies(<138 copies/mL). A negative result must be combined with clinical observations, patient history, and epidemiological information. The expected result is Negative.  Fact Sheet for Patients:  EntrepreneurPulse.com.au  Fact Sheet for Healthcare Providers:  IncredibleEmployment.be  This test is no t yet approved or cleared by the [place] FDA and  has been authorized for detection and/or diagnosis of SARS-CoV-2 by FDA under an Emergency Use Authorization (EUA). This EUA will remain  in effect (meaning this test can be used) for the duration of the COVID-19 declaration under Section 564(b)(1) of the Act, 21 U.S.C.section 360bbb-3(b)(1), unless the authorization is terminated  or revoked sooner.       Influenza A by PCR NEGATIVE NEGATIVE Final   Influenza B by PCR NEGATIVE NEGATIVE Final    Comment: (NOTE) The  Xpert Xpress SARS-CoV-2/FLU/RSV plus assay is intended as an aid in the diagnosis of influenza from Nasopharyngeal swab specimens and should not be used as a sole basis for treatment. Nasal washings and aspirates are unacceptable for Xpert Xpress SARS-CoV-2/FLU/RSV testing.  Fact Sheet for Patients: EntrepreneurPulse.com.au  Fact Sheet for Healthcare Providers: IncredibleEmployment.be  This test is not yet approved or cleared by the [place] FDA and has been authorized for detection and/or diagnosis of SARS-CoV-2 by FDA under an Emergency Use Authorization (EUA). This EUA will remain in effect (meaning this test can be used) for the duration of the COVID-19 declaration under Section 564(b)(1) of the Act, 21 U.S.C. section 360bbb-3(b)(1), unless the authorization is terminated or revoked.  Performed at [facility], [place] [address] [place], [place] [address]   Urine Culture     Status: Abnormal   Collection Time: 06/04/22 10:00 PM   Specimen: Urine, Clean Catch  Result Value Ref Range Status   Specimen Description URINE, CLEAN CATCH  Final   Special Requests   Final    NONE Performed at [facility],  [address], [place], [place] [address]    Culture MULTIPLE SPECIES PRESENT, SUGGEST RECOLLECTION (A)  Final   Report Status 06/06/2022 FINAL  Final  MRSA Next Gen by PCR, Nasal     Status: None   Collection Time: 06/05/22 12:30 AM   Specimen: Nasal Mucosa; Nasal Swab  Result Value Ref Range Status   MRSA by PCR Next Gen NOT DETECTED NOT DETECTED Final    Comment: (NOTE) The GeneXpert MRSA Assay (FDA approved for NASAL specimens only), is one component of a comprehensive MRSA colonization surveillance program. It is not intended to diagnose MRSA infection nor to guide or monitor treatment for MRSA infections. Test performance is not FDA approved in patients less than 11 years old. Performed at [facility], [place] [address] [place], [place] [address]      Radiology Studies: No results found.  Scheduled Meds:  feeding supplement (PROSource TF20)  60 mL Per Tube Daily   metoprolol tartrate  5 mg Intravenous Q6H   mouth rinse  15 mL Mouth Rinse 4 times per day   Continuous Infusions:  ceFEPime (MAXIPIME) IV 2 g (06/09/22 0528)   feeding supplement (JEVITY 1.2 CAL) 1,000 mL (06/08/22 1803)   heparin 1,100 Units/hr (06/09/22 0641)     LOS: 5 days   Darliss Cheney, MD [facility]  06/09/2022, 11:50 AM   *Please note that this is a verbal dictation therefore any spelling or grammatical errors are due to the "[place] One" system interpretation.  Please page via [place] and do not message via secure chat for urgent patient care matters. Secure chat can be used for non urgent patient care matters.  How to contact the [facility] Attending or Consulting provider [place] or covering provider during after hours [place], for this patient?  Check the care team in [facility] and look for a) attending/consulting [facility] provider listed and b) the [facility] team listed. Page or secure chat [phone]. Log into [email or web address] and use [place]'s universal password to access. If you do not have the password, please contact the hospital operator. Locate the [facility] provider you are looking for under [facility] and page to a number that you can be directly reached. If you still have difficulty reaching the provider, please page the [facility] (Director on Call) for the Hospitalists listed on amion for assistance.

## 2022-06-10 DIAGNOSIS — A419 Sepsis, unspecified organism: Secondary | ICD-10-CM | POA: Diagnosis not present

## 2022-06-10 DIAGNOSIS — J189 Pneumonia, unspecified organism: Secondary | ICD-10-CM | POA: Diagnosis not present

## 2022-06-10 LAB — CBC
HCT: 39.3 % (ref 36.0–46.0)
Hemoglobin: 12.3 g/dL (ref 12.0–15.0)
MCH: 27.8 pg (ref 26.0–34.0)
MCHC: 31.3 g/dL (ref 30.0–36.0)
MCV: 88.7 fL (ref 80.0–100.0)
Platelets: 208 10*3/uL (ref 150–400)
RBC: 4.43 MIL/uL (ref 3.87–5.11)
RDW: 14.9 % (ref 11.5–15.5)
WBC: 7.9 10*3/uL (ref 4.0–10.5)
nRBC: 0 % (ref 0.0–0.2)

## 2022-06-10 LAB — GLUCOSE, CAPILLARY
Glucose-Capillary: 128 mg/dL — ABNORMAL HIGH (ref 70–99)
Glucose-Capillary: 166 mg/dL — ABNORMAL HIGH (ref 70–99)
Glucose-Capillary: 139 mg/dL — ABNORMAL HIGH (ref 70–99)
Glucose-Capillary: 142 mg/dL — ABNORMAL HIGH (ref 70–99)
Glucose-Capillary: 149 mg/dL — ABNORMAL HIGH (ref 70–99)
Glucose-Capillary: 103 mg/dL — ABNORMAL HIGH (ref 70–99)

## 2022-06-10 LAB — BASIC METABOLIC PANEL
Anion gap: 6 (ref 5–15)
BUN: 20 mg/dL (ref 8–23)
CO2: 28 mmol/L (ref 22–32)
Calcium: 9 mg/dL (ref 8.9–10.3)
Chloride: 108 mmol/L (ref 98–111)
Creatinine, Ser: 0.81 mg/dL (ref 0.44–1.00)
GFR, Estimated: >60 mL/min (ref >60)
Glucose, Bld: 143 mg/dL — ABNORMAL HIGH (ref 70–99)
Potassium: 3.9 mmol/L (ref 3.5–5.1)
Sodium: 142 mmol/L (ref 135–145)

## 2022-06-10 LAB — HEPARIN LEVEL (UNFRACTIONATED): Heparin Unfractionated: 0.48 IU/mL (ref 0.30–0.70)

## 2022-06-10 NOTE — Progress Notes (Signed)
PROGRESS NOTE    Tara [name]  TDH:[id number] DOB: [birth year] DOA: 06/04/2022 PCP: Biagio Borg, MD   Brief Narrative:  HPI: This is a 71 year old female nursing home resident with past medical history of CKD, depression, sarcoid, dyslipidemia, and hypertension.  Today the patient was noted to have decreased level of consciousness, 911 was called.   Patient brought to the ER, patient hypoxic into the 80s on room air.  Chest x-ray shows pneumonia.  She is currently on 4 L oxygen sats in the high 90s.  Patient remains lethargic and poorly responsive.  Patient does open her eyes when stimulated, she follows instructions moving extremities once asked.  She however will not talk and provides no history.  Patient is hemodynamically stable.  Assessment & Plan:   Principal Problem:   Sepsis due to pneumonia [facility]) Active Problems:   Sarcoidosis   Hyperlipidemia   Essential hypertension   Elevated troponin   Obstructive sleep apnea   Lactic acidosis   Acute kidney injury superimposed on chronic kidney disease (HCC)   Acute liver failure   Hypernatremia   Dehydration   Anxiety and depression   Paroxysmal atrial fibrillation with RVR (HCC)  Severe sepsis and acute respiratory failure with hypoxia due to community-acquired pneumonia [facility]): Patient meets criteria for severe sepsis based on fever 102, tachycardia, tachypnea, leukocytosis, acute hypoxic respiratory failure and lactic acid of 2.3.  Chest x-ray suggest pneumonia/aspiration pneumonitis.  Completed 5 days of cefepime.  Patient currently on room air.  All cultures are negative so far.   Acute kidney injury superimposed on chronic kidney disease 3: Baseline creatinine appears to be around 1.2, presented with 1.86.  Now improved.  Hypokalemia: Resolved.   Acute liver failure ruled out: Patient has very minimally elevated LFTs, does not meet criteria for acute liver failure however elevated LFTs could very well be due to sepsis.  Now  improving.  Will repeat every other day.     Elevated troponin: Patient had elevated but essentially flat troponin with she is not having any chest pain or shortness of breath, likely demand ischemia.  Transthoracic echo rule out wall motion abnormality, 55% ejection fraction.    Atrial fibrillation with RVR: Has history of paroxysmal atrial fibrillation, was in RVR, likely in the setting of sepsis.  Patient very weak.  Rates controlled.  Patient failed swallowing, she is a strict n.p.o. getting tube feedings through core track, we will continue her on heparin drip until she is able to take p.o.    Hypernatremia: Improved.      Essential hypertension/CAD -Unable to take p.o. hydralazine, Imdur, Tranxene, Cardizem on hold -Schedule metoprolol with hold parameters.  Blood pressure controlled.    Obstructive sleep apnea -Patient protecting airways however, too ill for CPAP.    Hyperlipidemia -n.p.o.  Sarcoidosis  DVT prophylaxis: SCDs Start: 06/04/22 2221 heparin   Code Status: Full Code  Family Communication:  None present at bedside.  She says that she has no family around.  Her only sister lives in [place].  Status is: Inpatient Remains inpatient appropriate because: Patient unable to swallow.   Estimated body mass index is 26.9 kg/m as calculated from the following:   Height as of this encounter: '5\' 6"'$  (1.676 m).   Weight as of this encounter: 75.6 kg.    Nutritional Assessment: Body mass index is 26.9 kg/m.Marland Kitchen Seen by dietician.  I agree with the assessment and plan as outlined below: Nutrition Status: Nutrition Problem: Inadequate oral intake Etiology: inability to  eat Signs/Symptoms: NPO status Interventions: Tube feeding  . Skin Assessment: I have examined the patient's skin and I agree with the wound assessment as performed by the wound care RN as outlined below:    Consultants:  None  Procedures:  None  Antimicrobials:  Anti-infectives (From admission,  onward)    Start     Dose/Rate Route Frequency Ordered Stop   06/06/22 1730  ceFEPIme (MAXIPIME) 2 g in sodium chloride 0.9 % 100 mL IVPB        2 g 200 mL/hr over 30 Minutes Intravenous Every 12 hours 06/06/22 1627 06/09/22 1649   06/05/22 2200  vancomycin (VANCOREADY) IVPB 750 mg/150 mL  Status:  Discontinued        750 mg 150 mL/hr over 60 Minutes Intravenous Every 24 hours 06/04/22 2328 06/05/22 1019   06/04/22 2330  ceFEPIme (MAXIPIME) 2 g in sodium chloride 0.9 % 100 mL IVPB  Status:  Discontinued        2 g 200 mL/hr over 30 Minutes Intravenous Every 24 hours 06/04/22 2324 06/06/22 1627   06/04/22 2330  vancomycin (VANCOREADY) IVPB 1500 mg/300 mL        1,500 mg 150 mL/hr over 120 Minutes Intravenous  Once 06/04/22 2324 06/05/22 0233   06/04/22 2230  cefTRIAXone (ROCEPHIN) 2 g in sodium chloride 0.9 % 100 mL IVPB  Status:  Discontinued        2 g 200 mL/hr over 30 Minutes Intravenous Every 24 hours 06/04/22 2223 06/04/22 2224   06/04/22 2230  azithromycin (ZITHROMAX) 500 mg in sodium chloride 0.9 % 250 mL IVPB  Status:  Discontinued        500 mg 250 mL/hr over 60 Minutes Intravenous Every 24 hours 06/04/22 2223 06/04/22 2224   06/04/22 1730  cefTRIAXone (ROCEPHIN) 2 g in sodium chloride 0.9 % 100 mL IVPB  Status:  Discontinued        2 g 200 mL/hr over 30 Minutes Intravenous Every 24 hours 06/04/22 1725 06/04/22 2321   06/04/22 1730  azithromycin (ZITHROMAX) 500 mg in sodium chloride 0.9 % 250 mL IVPB  Status:  Discontinued        500 mg 250 mL/hr over 60 Minutes Intravenous Every 24 hours 06/04/22 1725 06/04/22 2321         Subjective:  Seen and examined.  She is slightly sleepy or lethargic today.  Appears comfortable otherwise.  Objective: Vitals:   06/10/22 0400 06/10/22 0600 06/10/22 0614 06/10/22 0800  BP:   (!) 154/99 (!) 144/87  Pulse: 83 76 92 72  Resp: '18 18 14 19  '$ Temp:    98 F (36.7 C)  TempSrc:    Axillary  SpO2: 99% 94% 98% 96%  Weight: 75.6 kg      Height:        Intake/Output Summary (Last 24 hours) at 06/10/2022 1047 Last data filed at 06/10/2022 0420 Gross per 24 hour  Intake 2197.06 ml  Output 850 ml  Net 1347.06 ml    Filed Weights   06/09/22 0451 06/09/22 0500 06/10/22 0400  Weight: 74 kg 74.1 kg 75.6 kg    Examination:  General exam: Appears calm and comfortable  Respiratory system: Clear to auscultation. Respiratory effort normal. Cardiovascular system: S1 & S2 heard, RRR. No JVD, murmurs, rubs, gallops or clicks. No pedal edema. Gastrointestinal system: Abdomen is nondistended, soft and nontender. No organomegaly or masses felt. Normal bowel sounds heard. Central nervous system: Alert and oriented. No focal neurological deficits. Extremities:  Symmetric 5 x 5 power. Skin: No rashes, lesions or ulcers.  Psychiatry: Judgement and insight appear normal. Mood & affect appropriate.   Data Reviewed: I have personally reviewed following labs and imaging studies  CBC: Recent Labs  Lab 06/04/22 1703 06/04/22 1725 06/05/22 0152 06/06/22 0644 06/07/22 0442 06/08/22 0229 06/09/22 0303 06/10/22 0256  WBC 22.2*  --  20.7* 11.2* 10.6* 7.5 6.9 7.9  NEUTROABS 19.7*  --  17.5*  --   --   --   --   --   HGB 16.7*   < > 12.8 13.8 14.6 13.9 13.1 12.3  HCT 51.3*   < > 39.0 41.8 44.3 41.0 39.2 39.3  MCV 86.8  --  88.4 88.2 87.0 84.4 85.6 88.7  PLT 345  --  211 180 183 151 216 208   < > = values in this interval not displayed.    Basic Metabolic Panel: Recent Labs  Lab 06/06/22 0644 06/06/22 1412 06/06/22 1817 06/07/22 0442 06/07/22 1631 06/08/22 0928 06/09/22 0303 06/10/22 0256  NA 145  --   --  145  --  144 143 142  K 3.2*  --   --  3.0* 3.2* 3.3* 4.2 3.9  CL 107  --   --  107  --  110 107 108  CO2 25  --   --  26  --  '26 26 28  '$ GLUCOSE 68*  --   --  122*  --  172* 160* 143*  BUN 16  --   --  16  --  '16 20 20  '$ CREATININE 0.91  --   --  0.85  --  0.83 0.85 0.81  CALCIUM 9.0  --   --  9.3  --  8.8* 9.1 9.0   MG 1.9 1.9 2.1 1.8 1.8 1.9  --   --   PHOS  --  2.7 2.6 1.9* 3.0  --   --   --     GFR: Estimated Creatinine Clearance: 68.1 mL/min (by C-G formula based on SCr of 0.81 mg/dL). Liver Function Tests: Recent Labs  Lab 06/04/22 1830 06/06/22 0644 06/07/22 0442  AST 61* 46* 37  ALT 58* 54* 45*  ALKPHOS 74 63 75  BILITOT 1.0 0.9 0.7  PROT 6.3* 5.9* 6.0*  ALBUMIN 3.2* 2.7* 2.8*    No results for input(s): "LIPASE", "AMYLASE" in the last 168 hours. Recent Labs  Lab 06/04/22 1703  AMMONIA 33    Coagulation Profile: Recent Labs  Lab 06/04/22 1703  INR 1.6*    Cardiac Enzymes: No results for input(s): "CKTOTAL", "CKMB", "CKMBINDEX", "TROPONINI" in the last 168 hours. BNP (last 3 results) No results for input(s): "PROBNP" in the last 8760 hours. HbA1C: No results for input(s): "HGBA1C" in the last 72 hours. CBG: Recent Labs  Lab 06/09/22 1624 06/09/22 2100 06/09/22 2356 06/10/22 0357 06/10/22 0821  GLUCAP 132* 114* 133* 128* 166*    Lipid Profile: No results for input(s): "CHOL", "HDL", "LDLCALC", "TRIG", "CHOLHDL", "LDLDIRECT" in the last 72 hours. Thyroid Function Tests: No results for input(s): "TSH", "T4TOTAL", "FREET4", "T3FREE", "THYROIDAB" in the last 72 hours.  Anemia Panel: No results for input(s): "VITAMINB12", "FOLATE", "FERRITIN", "TIBC", "IRON", "RETICCTPCT" in the last 72 hours. Sepsis Labs: Recent Labs  Lab 06/04/22 1703 06/04/22 1853 06/05/22 0038  LATICACIDVEN 2.2* 2.3* 1.5     Recent Results (from the past 240 hour(s))  Blood Culture (routine x 2)     Status: None   Collection Time: 06/04/22  5:03 PM   Specimen: BLOOD  Result Value Ref Range Status   Specimen Description BLOOD SITE NOT SPECIFIED  Final   Special Requests   Final    BOTTLES DRAWN AEROBIC AND ANAEROBIC Blood Culture adequate volume   Culture   Final    NO GROWTH 5 DAYS Performed at [facility], [address], [place], [place] [address]    Report Status  06/09/2022 FINAL  Final  Resp Panel by RT-PCR (Flu A&B, Covid) Anterior Nasal Swab     Status: None   Collection Time: 06/04/22  5:16 PM   Specimen: Anterior Nasal Swab  Result Value Ref Range Status   SARS Coronavirus 2 by RT PCR NEGATIVE NEGATIVE Final    Comment: (NOTE) SARS-CoV-2 target nucleic acids are NOT DETECTED.  The SARS-CoV-2 RNA is generally detectable in upper respiratory specimens during the acute phase of infection. The lowest concentration of SARS-CoV-2 viral copies this assay can detect is 138 copies/mL. A negative result does not preclude SARS-Cov-2 infection and should not be used as the sole basis for treatment or other patient management decisions. A negative result may occur with  improper specimen collection/handling, submission of specimen other than nasopharyngeal swab, presence of viral mutation(s) within the areas targeted by this assay, and inadequate number of viral copies(<138 copies/mL). A negative result must be combined with clinical observations, patient history, and epidemiological information. The expected result is Negative.  Fact Sheet for Patients:  EntrepreneurPulse.com.au  Fact Sheet for Healthcare Providers:  IncredibleEmployment.be  This test is no t yet approved or cleared by the [place] FDA and  has been authorized for detection and/or diagnosis of SARS-CoV-2 by FDA under an Emergency Use Authorization (EUA). This EUA will remain  in effect (meaning this test can be used) for the duration of the COVID-19 declaration under Section 564(b)(1) of the Act, 21 U.S.C.section 360bbb-3(b)(1), unless the authorization is terminated  or revoked sooner.       Influenza A by PCR NEGATIVE NEGATIVE Final   Influenza B by PCR NEGATIVE NEGATIVE Final    Comment: (NOTE) The Xpert Xpress SARS-CoV-2/FLU/RSV plus assay is intended as an aid in the diagnosis of influenza from Nasopharyngeal swab specimens  and should not be used as a sole basis for treatment. Nasal washings and aspirates are unacceptable for Xpert Xpress SARS-CoV-2/FLU/RSV testing.  Fact Sheet for Patients: EntrepreneurPulse.com.au  Fact Sheet for Healthcare Providers: IncredibleEmployment.be  This test is not yet approved or cleared by the [place] FDA and has been authorized for detection and/or diagnosis of SARS-CoV-2 by FDA under an Emergency Use Authorization (EUA). This EUA will remain in effect (meaning this test can be used) for the duration of the COVID-19 declaration under Section 564(b)(1) of the Act, 21 U.S.C. section 360bbb-3(b)(1), unless the authorization is terminated or revoked.  Performed at [facility], [place] [address] [place], [place] [address]   Urine Culture     Status: Abnormal   Collection Time: 06/04/22 10:00 PM   Specimen: Urine, Clean Catch  Result Value Ref Range Status   Specimen Description URINE, CLEAN CATCH  Final   Special Requests   Final    NONE Performed at [facility], [place] [address], [place], [place] [address]    Culture MULTIPLE SPECIES PRESENT, SUGGEST RECOLLECTION (A)  Final   Report Status 06/06/2022 FINAL  Final  MRSA Next Gen by PCR, Nasal     Status: None   Collection Time: 06/05/22 12:30 AM  Specimen: Nasal Mucosa; Nasal Swab  Result Value Ref Range Status   MRSA by PCR Next Gen NOT DETECTED NOT DETECTED Final    Comment: (NOTE) The GeneXpert MRSA Assay (FDA approved for NASAL specimens only), is one component of a comprehensive MRSA colonization surveillance program. It is not intended to diagnose MRSA infection nor to guide or monitor treatment for MRSA infections. Test performance is not FDA approved in patients less than 64 years old. Performed at [facility], [place] [address], [place], [place] [address]      Radiology Studies: No results found.  Scheduled Meds:  feeding supplement  (PROSource TF20)  60 mL Per Tube Daily   metoprolol tartrate  5 mg Intravenous Q6H   mouth rinse  15 mL Mouth Rinse 4 times per day   Continuous Infusions:  feeding supplement (JEVITY 1.2 CAL) 1,000 mL (06/10/22 0622)   heparin 1,100 Units/hr (06/10/22 0906)     LOS: 6 days   Darliss Cheney, MD [facility]  06/10/2022, 10:47 AM   *Please note that this is a verbal dictation therefore any spelling or grammatical errors are due to the "[place]" system interpretation.  Please page via [place] and do not message via secure chat for urgent patient care matters. Secure chat can be used for non urgent patient care matters.  How to contact the [facility] Attending or Consulting provider [place] or covering provider during after hours [place], for this patient?  Check the care team in [facility] and look for a) attending/consulting TRH provider listed and b) the [facility] team listed. Page or secure chat [phone]. Log into [email or web address] and use [place]'s universal password to access. If you do not have the password, please contact the hospital operator. Locate the [facility] provider you are looking for under [facility] and page to a number that you can be directly reached. If you still have difficulty reaching the provider, please page the [facility] (Director on Call) for the Hospitalists listed on amion for assistance.

## 2022-06-10 NOTE — Plan of Care (Signed)

## 2022-06-10 NOTE — Progress Notes (Signed)
ANTICOAGULATION CONSULT NOTE- follow-up  Pharmacy Consult for Heparin Indication: atrial fibrillation  Allergies  Allergen Reactions   Fluoxetine Hcl Other (See Comments)    (PROZAC) Suicidal thoughts   Sulfa Antibiotics Rash   Chocolate Other (See Comments)    SOMETIMES CAUSES SEVERE HEADACHES   Floxin [Ocuflox] Anxiety    shaky   Other Nausea And Vomiting    INSTANT ICED TEA PACKETS   Sulfonamide Derivatives Rash    Patient Measurements: Height: '5\' 6"'$  (167.6 cm) Weight: 75.6 kg (166 lb 10.7 oz) IBW/kg (Calculated) : 59.3 Heparin Dosing Weight: 75 kg  Vital Signs: Temp: 97.4 F (36.3 C) (11/26 0347) Temp Source: Oral (11/26 0347) BP: 154/99 (11/26 0614) Pulse Rate: 92 (11/26 0614)  Labs: Recent Labs    06/08/22 0229 06/08/22 0928 06/08/22 1725 06/09/22 0303 06/10/22 0256  HGB 13.9  --   --  13.1 12.3  HCT 41.0  --   --  39.2 39.3  PLT 151  --   --  216 208  APTT 62*  --  94*  --   --   HEPARINUNFRC 0.43  --  0.61 0.55 0.48  CREATININE  --  0.83  --  0.85 0.81    Estimated Creatinine Clearance: 68.1 mL/min (by C-G formula based on SCr of 0.81 mg/dL).     Assessment: 71 y.o. female presenting with AMS, in Afib w RVR started on IV heparin, on Eliquis PTA. Last reported dose the morning of 11/20. Pharmacy consulted to dose heparin.   Heparin level 0.48 on drip rate 1100 units/hr therapeutic. Continues to be therapeutic. Hgb 12.3 and plt 208. No s/sx bleeding noted in chart.     Goal of Therapy:  Heparin level 0.3-0.7 units/ml Monitor platelets by anticoagulation protocol: Yes   Plan:  Continue heparin infusion at 1100 units/hr Daily HL, CBC, s/sx bleeding F/u ability to transition back to PO apixaban   Thank you for allowing [place] to participate in this patients care.  Gena Fray, PharmD PGY1 Pharmacy Resident   06/10/2022 7:24 AM

## 2022-06-10 NOTE — Progress Notes (Signed)
Pt's blood sugar was 166.Informed MD

## 2022-06-11 DIAGNOSIS — Z66 Do not resuscitate: Secondary | ICD-10-CM | POA: Diagnosis not present

## 2022-06-11 DIAGNOSIS — Z20822 Contact with and (suspected) exposure to covid-19: Secondary | ICD-10-CM | POA: Diagnosis not present

## 2022-06-11 DIAGNOSIS — A419 Sepsis, unspecified organism: Secondary | ICD-10-CM | POA: Diagnosis not present

## 2022-06-11 DIAGNOSIS — J189 Pneumonia, unspecified organism: Secondary | ICD-10-CM | POA: Diagnosis not present

## 2022-06-11 LAB — GLUCOSE, CAPILLARY
Glucose-Capillary: 149 mg/dL — ABNORMAL HIGH (ref 70–99)
Glucose-Capillary: 151 mg/dL — ABNORMAL HIGH (ref 70–99)
Glucose-Capillary: 152 mg/dL — ABNORMAL HIGH (ref 70–99)
Glucose-Capillary: 163 mg/dL — ABNORMAL HIGH (ref 70–99)
Glucose-Capillary: 178 mg/dL — ABNORMAL HIGH (ref 70–99)
Glucose-Capillary: 186 mg/dL — ABNORMAL HIGH (ref 70–99)

## 2022-06-11 LAB — CBC
HCT: 38.1 % (ref 36.0–46.0)
Hemoglobin: 12.8 g/dL (ref 12.0–15.0)
MCH: 28.8 pg (ref 26.0–34.0)
MCHC: 33.6 g/dL (ref 30.0–36.0)
MCV: 85.8 fL (ref 80.0–100.0)
Platelets: 234 10*3/uL (ref 150–400)
RBC: 4.44 MIL/uL (ref 3.87–5.11)
RDW: 15.2 % (ref 11.5–15.5)
WBC: 9.2 10*3/uL (ref 4.0–10.5)
nRBC: 0 % (ref 0.0–0.2)

## 2022-06-11 LAB — HEPARIN LEVEL (UNFRACTIONATED): Heparin Unfractionated: 0.53 IU/mL (ref 0.30–0.70)

## 2022-06-11 MED ORDER — BRIMONIDINE TARTRATE-TIMOLOL 0.2-0.5 % OP SOLN: 1.0000 [drp] | Freq: Two times a day (BID) | OPHTHALMIC | Status: DC

## 2022-06-11 MED ORDER — ISOSORBIDE MONONITRATE ER 30 MG PO TB24: 30.0000 mg | ORAL_TABLET | Freq: Every day | ORAL | Status: DC

## 2022-06-11 MED ORDER — DILTIAZEM HCL ER COATED BEADS 240 MG PO CP24: 240.0000 mg | ORAL_CAPSULE | Freq: Every day | ORAL | Status: DC

## 2022-06-11 MED ORDER — POLYVINYL ALCOHOL 1.4 % OP SOLN
1.0000 [drp] | Freq: Three times a day (TID) | OPHTHALMIC | Status: DC | PRN
  Administered 2022-06-14 – 2022-06-29 (×2): 1 [drp] via OPHTHALMIC

## 2022-06-11 MED ORDER — APIXABAN 5 MG PO TABS
5.0000 mg | ORAL_TABLET | Freq: Two times a day (BID) | ORAL | Status: DC
  Administered 2022-06-11 – 2022-06-17 (×13): 5 mg
  Filled 2022-06-11 (×13): qty 1

## 2022-06-11 MED ORDER — DILTIAZEM HCL 60 MG PO TABS
60.0000 mg | ORAL_TABLET | Freq: Four times a day (QID) | ORAL | Status: DC
  Administered 2022-06-11 – 2022-06-15 (×18): 60 mg
  Filled 2022-06-11 (×19): qty 1

## 2022-06-11 MED ORDER — VALBENAZINE TOSYLATE 40 MG PO CAPS
40.0000 mg | ORAL_CAPSULE | Freq: Every day | ORAL | Status: DC
  Administered 2022-06-12 – 2022-07-03 (×21): 40 mg via ORAL
  Filled 2022-06-11 (×23): qty 1

## 2022-06-11 MED ORDER — BRIMONIDINE TARTRATE 0.2 % OP SOLN
1.0000 [drp] | Freq: Two times a day (BID) | OPHTHALMIC | Status: DC
  Administered 2022-06-11 – 2022-07-03 (×44): 1 [drp] via OPHTHALMIC
  Filled 2022-06-11 (×2): qty 5

## 2022-06-11 MED ORDER — TIMOLOL MALEATE 0.5 % OP SOLN
1.0000 [drp] | Freq: Two times a day (BID) | OPHTHALMIC | Status: DC
  Administered 2022-06-11 – 2022-07-03 (×44): 1 [drp] via OPHTHALMIC
  Filled 2022-06-11: qty 5

## 2022-06-11 MED ORDER — BIOTENE DRY MOUTH MT LIQD: 15.0000 mL | Freq: Three times a day (TID) | OROMUCOSAL | Status: DC | PRN

## 2022-06-11 MED ORDER — FERROUS SULFATE 300 (60 FE) MG/5ML PO SOLN
300.0000 mg | Freq: Every day | ORAL | Status: DC
  Administered 2022-06-12 – 2022-07-03 (×21): 300 mg
  Filled 2022-06-11 (×23): qty 5

## 2022-06-11 MED ORDER — HYDRALAZINE HCL 50 MG PO TABS
100.0000 mg | ORAL_TABLET | Freq: Three times a day (TID) | ORAL | Status: DC
  Administered 2022-06-11 – 2022-07-03 (×61): 100 mg
  Filled 2022-06-11 (×66): qty 2

## 2022-06-11 MED ORDER — FLECAINIDE ACETATE 50 MG PO TABS
50.0000 mg | ORAL_TABLET | Freq: Two times a day (BID) | ORAL | Status: DC
  Administered 2022-06-11 – 2022-07-03 (×44): 50 mg
  Filled 2022-06-11 (×47): qty 1

## 2022-06-11 MED ORDER — OMEGA 3 1200 MG PO CAPS: 2400.0000 mg | ORAL_CAPSULE | Freq: Every day | ORAL | Status: DC

## 2022-06-11 MED ORDER — VITAMIN D 25 MCG (1000 UNIT) PO TABS
1000.0000 [IU] | ORAL_TABLET | Freq: Every day | ORAL | Status: DC
  Administered 2022-06-11 – 2022-07-03 (×22): 1000 [IU]
  Filled 2022-06-11 (×22): qty 1

## 2022-06-11 MED ORDER — ADULT MULTIVITAMIN W/MINERALS CH
1.0000 | ORAL_TABLET | Freq: Every day | ORAL | Status: DC
  Administered 2022-06-11 – 2022-07-03 (×22): 1
  Filled 2022-06-11 (×22): qty 1

## 2022-06-11 MED ORDER — LATANOPROST 0.005 % OP SOLN
1.0000 [drp] | Freq: Every day | OPHTHALMIC | Status: DC
  Administered 2022-06-11 – 2022-07-02 (×18): 1 [drp] via OPHTHALMIC
  Filled 2022-06-11 (×3): qty 2.5

## 2022-06-11 MED ORDER — ISOSORBIDE DINITRATE 10 MG PO TABS
10.0000 mg | ORAL_TABLET | Freq: Three times a day (TID) | ORAL | Status: DC
  Administered 2022-06-11 – 2022-07-03 (×63): 10 mg
  Filled 2022-06-11 (×70): qty 1

## 2022-06-11 NOTE — Progress Notes (Signed)
CSW following. Confirmed with [employer] that patient has 120 SNF days per year (Responsible for 20%, $3000 deductible), has used 37 on file but that may not be accurate because patient has been at [facility] since 04/09/22, Ref# [id number].  Gilmore Laroche, MSW, [facility]

## 2022-06-11 NOTE — Plan of Care (Signed)

## 2022-06-11 NOTE — Progress Notes (Signed)
PROGRESS NOTE    Tara [name]  VCB:[id number] DOB: November 02, 1950 DOA: 06/04/2022 PCP: Biagio Borg, MD   Brief Narrative:  HPI: This is a 71 year old female nursing home resident with past medical history of CKD, depression, sarcoid, dyslipidemia, and hypertension.  Today the patient was noted to have decreased level of consciousness, 911 was called.   Patient brought to the ER, patient hypoxic into the 80s on room air.  Chest x-ray shows pneumonia.  She is currently on 4 L oxygen sats in the high 90s.  Patient remains lethargic and poorly responsive.  Patient does open her eyes when stimulated, she follows instructions moving extremities once asked.  She however will not talk and provides no history.  Patient is hemodynamically stable.  Assessment & Plan:   Principal Problem:   Sepsis due to pneumonia [facility]) Active Problems:   Sarcoidosis   Hyperlipidemia   Essential hypertension   Elevated troponin   Obstructive sleep apnea   Lactic acidosis   Acute kidney injury superimposed on chronic kidney disease (HCC)   Acute liver failure   Hypernatremia   Dehydration   Anxiety and depression   Paroxysmal atrial fibrillation with RVR (HCC)  Severe sepsis and acute respiratory failure with hypoxia due to community-acquired pneumonia [facility]): Patient meets criteria for severe sepsis based on fever 102, tachycardia, tachypnea, leukocytosis, acute hypoxic respiratory failure and lactic acid of 2.3.  Chest x-ray suggest pneumonia/aspiration pneumonitis.  Completed 5 days of cefepime.  Patient currently on room air.  All cultures are negative so far.   Acute kidney injury superimposed on chronic kidney disease 3: Baseline creatinine appears to be around 1.2, presented with 1.86.  Now improved.  Hypokalemia: Resolved.   Acute liver failure ruled out: Patient has very minimally elevated LFTs, does not meet criteria for acute liver failure however elevated LFTs could very well be due to sepsis.   Improved.    Elevated troponin: Patient had elevated but essentially flat troponin with she is not having any chest pain or shortness of breath, likely demand ischemia.  Transthoracic echo rule out wall motion abnormality, 55% ejection fraction.    Atrial fibrillation with RVR: Has history of paroxysmal atrial fibrillation, was in RVR, likely in the setting of sepsis.  Patient very weak.  Rates controlled.  Patient failed swallowing, she is a strict n.p.o. getting tube feedings through core track, will transition her from heparin to Eliquis today.  Will also resume flecainide.    Hypernatremia: Improved.      Essential hypertension/CAD Blood pressure elevated.  Will resume hydralazine, Cardizem CD, Imdur.    Obstructive sleep apnea -Patient protecting airways however, too ill for CPAP.    Hyperlipidemia -she is not on any medications for his.  Sarcoidosis  DVT prophylaxis: SCDs Start: 06/04/22 2221 heparin   Code Status: Full Code  Family Communication:  None present at bedside.  She says that she has no family around.  Her only sister lives in [place].  Status is: Inpatient Remains inpatient appropriate because: Patient unable to swallow.  SLP following.   Estimated body mass index is 26.9 kg/m as calculated from the following:   Height as of this encounter: '5\' 6"'$  (1.676 m).   Weight as of this encounter: 75.6 kg.    Nutritional Assessment: Body mass index is 26.9 kg/m.Marland Kitchen Seen by dietician.  I agree with the assessment and plan as outlined below: Nutrition Status: Nutrition Problem: Inadequate oral intake Etiology: inability to eat Signs/Symptoms: NPO status Interventions: Tube feeding  .  Skin Assessment: I have examined the patient's skin and I agree with the wound assessment as performed by the wound care RN as outlined below:    Consultants:  None  Procedures:  None  Antimicrobials:  Anti-infectives (From admission, onward)    Start     Dose/Rate Route  Frequency Ordered Stop   06/06/22 1730  ceFEPIme (MAXIPIME) 2 g in sodium chloride 0.9 % 100 mL IVPB        2 g 200 mL/hr over 30 Minutes Intravenous Every 12 hours 06/06/22 1627 06/09/22 1649   06/05/22 2200  vancomycin (VANCOREADY) IVPB 750 mg/150 mL  Status:  Discontinued        750 mg 150 mL/hr over 60 Minutes Intravenous Every 24 hours 06/04/22 2328 06/05/22 1019   06/04/22 2330  ceFEPIme (MAXIPIME) 2 g in sodium chloride 0.9 % 100 mL IVPB  Status:  Discontinued        2 g 200 mL/hr over 30 Minutes Intravenous Every 24 hours 06/04/22 2324 06/06/22 1627   06/04/22 2330  vancomycin (VANCOREADY) IVPB 1500 mg/300 mL        1,500 mg 150 mL/hr over 120 Minutes Intravenous  Once 06/04/22 2324 06/05/22 0233   06/04/22 2230  cefTRIAXone (ROCEPHIN) 2 g in sodium chloride 0.9 % 100 mL IVPB  Status:  Discontinued        2 g 200 mL/hr over 30 Minutes Intravenous Every 24 hours 06/04/22 2223 06/04/22 2224   06/04/22 2230  azithromycin (ZITHROMAX) 500 mg in sodium chloride 0.9 % 250 mL IVPB  Status:  Discontinued        500 mg 250 mL/hr over 60 Minutes Intravenous Every 24 hours 06/04/22 2223 06/04/22 2224   06/04/22 1730  cefTRIAXone (ROCEPHIN) 2 g in sodium chloride 0.9 % 100 mL IVPB  Status:  Discontinued        2 g 200 mL/hr over 30 Minutes Intravenous Every 24 hours 06/04/22 1725 06/04/22 2321   06/04/22 1730  azithromycin (ZITHROMAX) 500 mg in sodium chloride 0.9 % 250 mL IVPB  Status:  Discontinued        500 mg 250 mL/hr over 60 Minutes Intravenous Every 24 hours 06/04/22 1725 06/04/22 2321         Subjective:  Patient seen and examined.  She has no complaints.  Objective: Vitals:   06/11/22 0443 06/11/22 0600 06/11/22 0800 06/11/22 0842  BP: (!) 147/95  (!) 167/104 (!) 159/94  Pulse: 94  96 92  Resp: 20 (!) 29 (!) 36 (!) 22  Temp: 98 F (36.7 C)  97.8 F (36.6 C) 98 F (36.7 C)  TempSrc: Oral  Axillary   SpO2: 91%  99% 99%  Weight:      Height:        Intake/Output  Summary (Last 24 hours) at 06/11/2022 1058 Last data filed at 06/11/2022 0510 Gross per 24 hour  Intake --  Output 300 ml  Net -300 ml    Filed Weights   06/09/22 0451 06/09/22 0500 06/10/22 0400  Weight: 74 kg 74.1 kg 75.6 kg    Examination:  General exam: Appears calm and comfortable  Respiratory system: Clear to auscultation. Respiratory effort normal. Cardiovascular system: S1 & S2 heard, RRR. No JVD, murmurs, rubs, gallops or clicks. No pedal edema. Gastrointestinal system: Abdomen is nondistended, soft and nontender. No organomegaly or masses felt. Normal bowel sounds heard. Central nervous system: Alert and oriented. No focal neurological deficits. Extremities: Symmetric 5 x 5 power. Skin: No  rashes, lesions or ulcers.  Psychiatry: Judgement and insight appear normal. Mood & affect appropriate.   Data Reviewed: I have personally reviewed following labs and imaging studies  CBC: Recent Labs  Lab 06/04/22 1703 06/04/22 1725 06/05/22 0152 06/06/22 0644 06/07/22 0442 06/08/22 0229 06/09/22 0303 06/10/22 0256 06/11/22 0303  WBC 22.2*  --  20.7*   < > 10.6* 7.5 6.9 7.9 9.2  NEUTROABS 19.7*  --  17.5*  --   --   --   --   --   --   HGB 16.7*   < > 12.8   < > 14.6 13.9 13.1 12.3 12.8  HCT 51.3*   < > 39.0   < > 44.3 41.0 39.2 39.3 38.1  MCV 86.8  --  88.4   < > 87.0 84.4 85.6 88.7 85.8  PLT 345  --  211   < > 183 151 216 208 234   < > = values in this interval not displayed.    Basic Metabolic Panel: Recent Labs  Lab 06/06/22 0644 06/06/22 1412 06/06/22 1817 06/07/22 0442 06/07/22 1631 06/08/22 0928 06/09/22 0303 06/10/22 0256  NA 145  --   --  145  --  144 143 142  K 3.2*  --   --  3.0* 3.2* 3.3* 4.2 3.9  CL 107  --   --  107  --  110 107 108  CO2 25  --   --  26  --  '26 26 28  '$ GLUCOSE 68*  --   --  122*  --  172* 160* 143*  BUN 16  --   --  16  --  '16 20 20  '$ CREATININE 0.91  --   --  0.85  --  0.83 0.85 0.81  CALCIUM 9.0  --   --  9.3  --  8.8* 9.1  9.0  MG 1.9 1.9 2.1 1.8 1.8 1.9  --   --   PHOS  --  2.7 2.6 1.9* 3.0  --   --   --     GFR: Estimated Creatinine Clearance: 68.1 mL/min (by C-G formula based on SCr of 0.81 mg/dL). Liver Function Tests: Recent Labs  Lab 06/04/22 1830 06/06/22 0644 06/07/22 0442  AST 61* 46* 37  ALT 58* 54* 45*  ALKPHOS 74 63 75  BILITOT 1.0 0.9 0.7  PROT 6.3* 5.9* 6.0*  ALBUMIN 3.2* 2.7* 2.8*    No results for input(s): "LIPASE", "AMYLASE" in the last 168 hours. Recent Labs  Lab 06/04/22 1703  AMMONIA 33    Coagulation Profile: Recent Labs  Lab 06/04/22 1703  INR 1.6*    Cardiac Enzymes: No results for input(s): "CKTOTAL", "CKMB", "CKMBINDEX", "TROPONINI" in the last 168 hours. BNP (last 3 results) No results for input(s): "PROBNP" in the last 8760 hours. HbA1C: No results for input(s): "HGBA1C" in the last 72 hours. CBG: Recent Labs  Lab 06/10/22 1648 06/10/22 1943 06/10/22 2309 06/11/22 0327 06/11/22 0813  GLUCAP 142* 149* 103* 149* 151*    Lipid Profile: No results for input(s): "CHOL", "HDL", "LDLCALC", "TRIG", "CHOLHDL", "LDLDIRECT" in the last 72 hours. Thyroid Function Tests: No results for input(s): "TSH", "T4TOTAL", "FREET4", "T3FREE", "THYROIDAB" in the last 72 hours.  Anemia Panel: No results for input(s): "VITAMINB12", "FOLATE", "FERRITIN", "TIBC", "IRON", "RETICCTPCT" in the last 72 hours. Sepsis Labs: Recent Labs  Lab 06/04/22 1703 06/04/22 1853 06/05/22 0038  LATICACIDVEN 2.2* 2.3* 1.5     Recent Results (from the past 240  hour(s))  Blood Culture (routine x 2)     Status: None   Collection Time: 06/04/22  5:03 PM   Specimen: BLOOD  Result Value Ref Range Status   Specimen Description BLOOD SITE NOT SPECIFIED  Final   Special Requests   Final    BOTTLES DRAWN AEROBIC AND ANAEROBIC Blood Culture adequate volume   Culture   Final    NO GROWTH 5 DAYS Performed at [facility], [address], [place], [place] [address]    Report Status  06/09/2022 FINAL  Final  Resp Panel by RT-PCR (Flu A&B, Covid) Anterior Nasal Swab     Status: None   Collection Time: 06/04/22  5:16 PM   Specimen: Anterior Nasal Swab  Result Value Ref Range Status   SARS Coronavirus 2 by RT PCR NEGATIVE NEGATIVE Final    Comment: (NOTE) SARS-CoV-2 target nucleic acids are NOT DETECTED.  The SARS-CoV-2 RNA is generally detectable in upper respiratory specimens during the acute phase of infection. The lowest concentration of SARS-CoV-2 viral copies this assay can detect is 138 copies/mL. A negative result does not preclude SARS-Cov-2 infection and should not be used as the sole basis for treatment or other patient management decisions. A negative result may occur with  improper specimen collection/handling, submission of specimen other than nasopharyngeal swab, presence of viral mutation(s) within the areas targeted by this assay, and inadequate number of viral copies(<138 copies/mL). A negative result must be combined with clinical observations, patient history, and epidemiological information. The expected result is Negative.  Fact Sheet for Patients:  EntrepreneurPulse.com.au  Fact Sheet for Healthcare Providers:  IncredibleEmployment.be  This test is no t yet approved or cleared by the [place] FDA and  has been authorized for detection and/or diagnosis of SARS-CoV-2 by FDA under an Emergency Use Authorization (EUA). This EUA will remain  in effect (meaning this test can be used) for the duration of the COVID-19 declaration under Section 564(b)(1) of the Act, 21 U.S.C.section 360bbb-3(b)(1), unless the authorization is terminated  or revoked sooner.       Influenza A by PCR NEGATIVE NEGATIVE Final   Influenza B by PCR NEGATIVE NEGATIVE Final    Comment: (NOTE) The Xpert Xpress SARS-CoV-2/FLU/RSV plus assay is intended as an aid in the diagnosis of influenza from Nasopharyngeal swab specimens  and should not be used as a sole basis for treatment. Nasal washings and aspirates are unacceptable for Xpert Xpress SARS-CoV-2/FLU/RSV testing.  Fact Sheet for Patients: EntrepreneurPulse.com.au  Fact Sheet for Healthcare Providers: IncredibleEmployment.be  This test is not yet approved or cleared by the [place] FDA and has been authorized for detection and/or diagnosis of SARS-CoV-2 by FDA under an Emergency Use Authorization (EUA). This EUA will remain in effect (meaning this test can be used) for the duration of the COVID-19 declaration under Section 564(b)(1) of the Act, 21 U.S.C. section 360bbb-3(b)(1), unless the authorization is terminated or revoked.  Performed at [facility], [place] [address], [place], [place] [address]   Urine Culture     Status: Abnormal   Collection Time: 06/04/22 10:00 PM   Specimen: Urine, Clean Catch  Result Value Ref Range Status   Specimen Description URINE, CLEAN CATCH  Final   Special Requests   Final    NONE Performed at [facility], [place] [address] [place], [place] [address]    Culture MULTIPLE SPECIES PRESENT, SUGGEST RECOLLECTION (A)  Final   Report Status 06/06/2022 FINAL  Final  MRSA Next Gen by PCR, Nasal     Status: None   Collection Time: 06/05/22 12:30 AM   Specimen: Nasal Mucosa; Nasal Swab  Result Value Ref Range Status   MRSA by PCR Next Gen NOT DETECTED NOT DETECTED Final    Comment: (NOTE) The GeneXpert MRSA Assay (FDA approved for NASAL specimens only), is one component of a comprehensive MRSA colonization surveillance program. It is not intended to diagnose MRSA infection nor to guide or monitor treatment for MRSA infections. Test performance is not FDA approved in patients less than 32 years old. Performed at [facility], [place] [address] [place], [place] [address]      Radiology Studies: No results found.  Scheduled Meds:  feeding supplement  (PROSource TF20)  60 mL Per Tube Daily   metoprolol tartrate  5 mg Intravenous Q6H   mouth rinse  15 mL Mouth Rinse 4 times per day   Continuous Infusions:  feeding supplement (JEVITY 1.2 CAL) 1,000 mL (06/11/22 0135)   heparin 1,100 Units/hr (06/10/22 0906)     LOS: 7 days   Darliss Cheney, MD [facility]  06/11/2022, 10:58 AM   *Please note that this is a verbal dictation therefore any spelling or grammatical errors are due to the "[place] [employer]" system interpretation.  Please page via [place] and do not message via secure chat for urgent patient care matters. Secure chat can be used for non urgent patient care matters.  How to contact the [facility] Attending or Consulting provider [place] or covering provider during after hours [place], for this patient?  Check the care team in [facility] and look for a) attending/consulting TR[facility] provider listed and b) the [facility] team listed. Page or secure chat [phone]. Log into [email or web address] and use [place]'s universal password to access. If you do not have the password, please contact the hospital operator. Locate the [facility] provider you are looking for under [facility] and page to a number that you can be directly reached. If you still have difficulty reaching the provider, please page the [facility] (Director on Call) for the Hospitalists listed on amion for assistance.

## 2022-06-11 NOTE — Progress Notes (Signed)
ANTICOAGULATION CONSULT NOTE- follow-up  Pharmacy Consult for Heparin Indication: atrial fibrillation  Allergies  Allergen Reactions   Fluoxetine Hcl Other (See Comments)    (PROZAC) Suicidal thoughts   Sulfa Antibiotics Rash   Chocolate Other (See Comments)    SOMETIMES CAUSES SEVERE HEADACHES   Floxin [Ocuflox] Anxiety    shaky   Other Nausea And Vomiting    INSTANT ICED TEA PACKETS   Sulfonamide Derivatives Rash    Patient Measurements: Height: '5\' 6"'$  (167.6 cm) Weight: 75.6 kg (166 lb 10.7 oz) IBW/kg (Calculated) : 59.3 Heparin Dosing Weight: 75 kg  Vital Signs: Temp: 98 F (36.7 C) (11/27 0443) Temp Source: Oral (11/27 0443) BP: 147/95 (11/27 0443) Pulse Rate: 94 (11/27 0443)  Labs: Recent Labs    06/08/22 0928 06/08/22 1725 06/08/22 1725 06/09/22 0303 06/10/22 0256 06/11/22 0303  HGB  --   --    < > 13.1 12.3 12.8  HCT  --   --   --  39.2 39.3 38.1  PLT  --   --   --  216 208 234  APTT  --  94*  --   --   --   --   HEPARINUNFRC  --  0.61   < > 0.55 0.48 0.53  CREATININE 0.83  --   --  0.85 0.81  --    < > = values in this interval not displayed.     Estimated Creatinine Clearance: 68.1 mL/min (by C-G formula based on SCr of 0.81 mg/dL).     Assessment: 71 y.o. female presenting with AMS, in Afib w RVR started on IV heparin, on Eliquis PTA. Last reported dose the morning of 11/20. Pharmacy consulted to dose heparin.   Heparin level therapeutic, CBC stable   Goal of Therapy:  Heparin level 0.3-0.7 units/ml Monitor platelets by anticoagulation protocol: Yes   Plan:  Continue heparin infusion at 1100 units/hr Daily HL, CBC, s/sx bleeding F/u ability to transition back to PO apixaban   Thank you for allowing [place] to participate in this patients care.  Thank you Anette Guarneri, PharmD  06/11/2022 7:49 AM

## 2022-06-11 NOTE — Progress Notes (Signed)
Physical Therapy Treatment Patient Details Name: Tara [name] MRN: [id number] DOB: 1951-01-15 Today's Date: 06/11/2022   History of Present Illness 71 y.o. female admitted from [facility] SN[facility] with decreased responsiveness and not eating or drinking. Pt with multiple recent admission for weakness and falls. Patient recently had cardiology follow up, placed on zio patch on 03/26/22 for 2 weeks. Patient  since 9/11 has had 3 ED visit for non-intractable HA,last of which was 9/16 for which she was evaluated ,treated and discharged home in improve condition. PMH significant for CKD, sarcoidosis, HTN, obesity,OSA on CPAP, atrial fibrillation on Eliquis, hx of presyncope seen in ed 3 x since 8/29.    PT Comments    Pt was seen for mobility on RW to sidestep a few feet, as well as to get standing and balance on RW.  Her assistance level has declined, and is more capable of assisting to scoot out on EOB, to stand up and to figure out her balance skills.  Control of standing is impeded by PF posture on L foot and ankle, but pt is motivated to try to move.  Focus on her LE strength, her balance esp upon initial standing and control of gait on RW with greater distances as she tolerates.  Recommend return to her SNF for follow up care.   Recommendations for follow up therapy are one component of a multi-disciplinary discharge planning process, led by the attending physician.  Recommendations may be updated based on patient status, additional functional criteria and insurance authorization.  Follow Up Recommendations  Skilled nursing-short term rehab (<3 hours/day) Can patient physically be transported by private vehicle: No   Assistance Recommended at Discharge Frequent or constant Supervision/Assistance  Patient can return home with the following Two people to help with walking and/or transfers;Assist for transportation;A lot of help with bathing/dressing/bathroom;Help with stairs or ramp for entrance    Equipment Recommendations  None recommended by PT    Recommendations for Other Services       Precautions / Restrictions Precautions Precautions: Fall Precaution Comments: watch HR and BP; cortrak Restrictions Weight Bearing Restrictions: No     Mobility  Bed Mobility Overal bed mobility: Needs Assistance Bed Mobility: Supine to Sit, Sit to Supine     Supine to sit: Mod assist, HOB elevated Sit to supine: Min assist        Transfers Overall transfer level: Needs assistance Equipment used: Rolling walker (2 wheels) Transfers: Sit to/from Stand Sit to Stand: Mod assist           General transfer comment: mod assist with dense instructions for hand placement, sidesteps and refuses chair    Ambulation/Gait Ambulation/Gait assistance: Min assist, +2 physical assistance, +2 safety/equipment Gait Distance (Feet): 4 Feet Assistive device: Rolling walker (2 wheels), 2 person hand held assist Gait Pattern/deviations: Step-to pattern, Decreased stride length, Wide base of support, Decreased weight shift to left Gait velocity: reduced Gait velocity interpretation: <1.31 ft/sec, indicative of household ambulator Pre-gait activities: standing balance control General Gait Details: pt is in PF on L ankle and cannot wb well to unload RLE to sidestep   Stairs             Wheelchair Mobility    Modified Rankin (Stroke Patients Only)       Balance Overall balance assessment: Needs assistance Sitting-balance support: Feet supported Sitting balance-Leahy Scale: Fair   Postural control: Posterior lean, Right lateral lean Standing balance support: Bilateral upper extremity supported, During functional activity Standing balance-Leahy Scale: Poor Standing  balance comment: min assist of two for balance control to sidestep                            Cognition Arousal/Alertness: Awake/alert Behavior During Therapy: Flat affect Overall Cognitive Status:  No family/caregiver present to determine baseline cognitive functioning                                 General Comments: delayed motor responses both physically and verbally        Exercises      General Comments General comments (skin integrity, edema, etc.): despite findings of imaging, pt has significant weakness and coordination change on L side      Pertinent Vitals/Pain Pain Assessment Pain Assessment: 0-10 Pain Score: 8  Pain Location: headache Pain Descriptors / Indicators: Discomfort, Headache Pain Intervention(s): Limited activity within patient's tolerance, Monitored during session, Repositioned    Home Living                          Prior Function            PT Goals (current goals can now be found in the care plan section) Acute Rehab PT Goals Patient Stated Goal: to find new rehab facility Progress towards PT goals: Progressing toward goals    Frequency    Min 2X/week      PT Plan Current plan remains appropriate    Co-evaluation              AM-PAC PT "6 Clicks" Mobility   Outcome Measure  Help needed turning from your back to your side while in a flat bed without using bedrails?: A Lot Help needed moving from lying on your back to sitting on the side of a flat bed without using bedrails?: A Lot Help needed moving to and from a bed to a chair (including a wheelchair)?: A Lot Help needed standing up from a chair using your arms (e.g., wheelchair or bedside chair)?: A Lot Help needed to walk in hospital room?: Total Help needed climbing 3-5 steps with a railing? : Total 6 Click Score: 10    End of Session Equipment Utilized During Treatment: Gait belt Activity Tolerance: Patient limited by fatigue;Treatment limited secondary to medical complications (Comment) Patient left: in chair;with call bell/phone within reach;with chair alarm set Nurse Communication: Mobility status;Need for lift equipment PT Visit  Diagnosis: Other abnormalities of gait and mobility (R26.89);Muscle weakness (generalized) (M62.81)     Time: 1497-0263 PT Time Calculation (min) (ACUTE ONLY): 23 min  Charges:  $Therapeutic Activity: 8-22 mins   Ramond Dial 06/11/2022, 4:27 PM  Mee Hives, PT PhD Acute Rehab Dept. Number: [place] and [place]

## 2022-06-11 NOTE — Progress Notes (Signed)
Occupational Therapy Treatment Patient Details Name: Tara [name] MRN: [id number] DOB: 1951/03/11 Today's Date: 06/11/2022   History of present illness Pt is a 71 y.o. female admitted from [facility] with decreased responsiveness and not eating or drinking. Pt with multiple recent admission for weakness and fall. Patient on recent cardiology follow up was  placed on zio patch on 03/26/22 for 2 weeks. Patient  since 9/11 has had 3 ED visit for non-intractable HA,last of which was 9/16 for which she was evaluated ,treated and discharged home in improve condition. PMH significant for CKD, sarcoidosis, HTN, obesity,OSA on CPAP, atrial fibrillation on Eliquis, hx of presyncope seen in ed 3 x since 8/29.   OT comments  Pt with incremental progress towards OT goals. Focus on bed mobility, standing balance and progression of taking steps with RW. Pt requires Mod A x 1 to stand at bedside and Min A x 2 for taking steps with RW. Noted standing balance deficits with pt requiring hands on assist at all times. Pt declined to sit up in chair and returned to bed at end of session. Continue to rec SNF rehab at DC.   Recommendations for follow up therapy are one component of a multi-disciplinary discharge planning process, led by the attending physician.  Recommendations may be updated based on patient status, additional functional criteria and insurance authorization.    Follow Up Recommendations  Skilled nursing-short term rehab (<3 hours/day)     Assistance Recommended at Discharge Frequent or constant Supervision/Assistance  Patient can return home with the following  Two people to help with walking and/or transfers;Two people to help with bathing/dressing/bathroom;Assistance with cooking/housework;Assistance with feeding;Direct supervision/assist for medications management;Direct supervision/assist for financial management;Assist for transportation   Equipment Recommendations  Other (comment) (TBD  pending progress)    Recommendations for Other Services      Precautions / Restrictions Precautions Precautions: Fall Precaution Comments: watch HR and BP; cortrak Restrictions Weight Bearing Restrictions: No       Mobility Bed Mobility Overal bed mobility: Needs Assistance Bed Mobility: Supine to Sit, Sit to Supine     Supine to sit: Mod assist, HOB elevated Sit to supine: Min assist   General bed mobility comments: assist for lifting trunk and scooting to EOB. Light Min A to get R LE back into bed    Transfers Overall transfer level: Needs assistance Equipment used: Rolling walker (2 wheels) Transfers: Sit to/from Stand Sit to Stand: Mod assist           General transfer comment: progression to Mod A x1 for standing at bedside with cues for tucking bottom in and leaning forward. Min A x 2 for steps at bedside with assist for DME mgmt and cues for stepping. pt declined to get up to chair     Balance Overall balance assessment: Needs assistance Sitting-balance support: Feet supported Sitting balance-Leahy Scale: Fair     Standing balance support: Bilateral upper extremity supported Standing balance-Leahy Scale: Poor                             ADL either performed or assessed with clinical judgement   ADL Overall ADL's : Needs assistance/impaired Eating/Feeding: NPO Eating/Feeding Details (indicate cue type and reason): SLP at bedside initially, trialing ice chips w/ spoon  General ADL Comments: Focus on progression of standing balance at bedside. pt declined to get up to chair    Extremity/Trunk Assessment Upper Extremity Assessment Upper Extremity Assessment: Generalized weakness   Lower Extremity Assessment Lower Extremity Assessment: Defer to PT evaluation        Vision   Vision Assessment?: No apparent visual deficits   Perception     Praxis      Cognition Arousal/Alertness:  Awake/alert Behavior During Therapy: Flat affect Overall Cognitive Status: No family/caregiver present to determine baseline cognitive functioning                                 General Comments: flat affect, inconsistent responses to questions durnig session (would not answer at times), followed commands fairly well though required repetition. delayed motor planning and problem solving        Exercises      Shoulder Instructions       General Comments      Pertinent Vitals/ Pain       Pain Assessment Pain Assessment: No/denies pain  Home Living                                          Prior Functioning/Environment              Frequency  Min 2X/week        Progress Toward Goals  OT Goals(current goals can now be found in the care plan section)  Progress towards OT goals: Progressing toward goals  Acute Rehab OT Goals Patient Stated Goal: none stated but agreeable for OOB activities OT Goal Formulation: With patient Time For Goal Achievement: 06/21/22 Potential to Achieve Goals: Good ADL Goals Pt Will Perform Eating: with set-up;sitting Pt Will Perform Grooming: with set-up;sitting Pt Will Perform Upper Body Dressing: with min guard assist;sitting Pt Will Perform Lower Body Dressing: with mod assist;sit to/from stand Pt Will Transfer to Toilet: stand pivot transfer;bedside commode;with min assist  Plan Discharge plan remains appropriate    Co-evaluation    PT/OT/SLP Co-Evaluation/Treatment: Yes Reason for Co-Treatment: For patient/therapist safety;To address functional/ADL transfers   OT goals addressed during session: ADL's and self-care      AM-PAC OT "6 Clicks" Daily Activity     Outcome Measure   Help from another person eating meals?: A Little Help from another person taking care of personal grooming?: A Little Help from another person toileting, which includes using toliet, bedpan, or urinal?: Total Help  from another person bathing (including washing, rinsing, drying)?: A Lot Help from another person to put on and taking off regular upper body clothing?: A Lot Help from another person to put on and taking off regular lower body clothing?: Total 6 Click Score: 12    End of Session Equipment Utilized During Treatment: Rolling walker (2 wheels);Gait belt  OT Visit Diagnosis: Muscle weakness (generalized) (M62.81);Other symptoms and signs involving cognitive function;Feeding difficulties (R63.3);History of falling (Z91.81);Other symptoms and signs involving the nervous system (R29.898);Pain   Activity Tolerance Patient tolerated treatment well   Patient Left in bed;with call bell/phone within reach;with bed alarm set   Nurse Communication Mobility status        Time: 9702-6378 OT Time Calculation (min): 23 min  Charges: OT General Charges $OT Visit: 1 Visit OT Treatments $Therapeutic Activity: 8-22 mins  Malachy Chamber,  OTR/L Acute Rehab Services Office: [phone]   Layla Maw 06/11/2022, 12:36 PM

## 2022-06-11 NOTE — Progress Notes (Signed)
Speech Language Pathology Treatment: Dysphagia  Patient Details Name: Tara [name] MRN: [id number] DOB: November 02, 1950 Today's Date: 06/11/2022 Time: 8115-7262 SLP Time Calculation (min) (ACUTE ONLY): 25 min  Assessment / Plan / Recommendation Clinical Impression  Pt seen for dyphagia f/u tx session with varying consistencies.  Pt more alert this session and answering questions with min-mod cueing provided during intake of puree/ice chips and thin liquids via tsp to A with oral clearance/propulsion d/t oral holding and prolonged propulsion into pharynx and delayed initiation of the swallow.  Multiple post-swallow repetitions with puree and wet vocal quality observed post tsp of thin liquid.  No overt s/s of aspiration noted today after ice chip presentation, but objective assessment beneficial if pt mentation allows to determine safest diet consistency prior to D/C.  Pt has Cortrak TF in place for nutrition/hydration purposes.  Recommend MBS when pt able to assess swallow function.  ST will continue to f/u in acute setting.  HPI HPI: This is a 71 year old female nursing home resident with past medical history of CKD, depression, sarcoid, dyslipidemia, and hypertension. Severe sepsis and acute respiratory failure with hypoxia due to community-acquired pneumonia; ST f/u for dysphagia tx/assessment.      SLP Plan  MBS      Recommendations for follow up therapy are one component of a multi-disciplinary discharge planning process, led by the attending physician.  Recommendations may be updated based on patient status, additional functional criteria and insurance authorization.    Recommendations  Diet recommendations: NPO Medication Administration: Via alternative means                Oral Care Recommendations: Oral care BID;Staff/trained caregiver to provide oral care Follow Up Recommendations: Skilled nursing-short term rehab (<3 hours/day) Assistance recommended at discharge: Frequent or  constant Supervision/Assistance SLP Visit Diagnosis: Dysphagia, unspecified (R13.10) Plan: MBS           Elvina Sidle, M.S., CCC-SLP  06/11/2022, 1:06 PM

## 2022-06-12 DIAGNOSIS — A419 Sepsis, unspecified organism: Secondary | ICD-10-CM | POA: Diagnosis not present

## 2022-06-12 DIAGNOSIS — J189 Pneumonia, unspecified organism: Secondary | ICD-10-CM | POA: Diagnosis not present

## 2022-06-12 LAB — GLUCOSE, CAPILLARY
Glucose-Capillary: 170 mg/dL — ABNORMAL HIGH (ref 70–99)
Glucose-Capillary: 174 mg/dL — ABNORMAL HIGH (ref 70–99)
Glucose-Capillary: 137 mg/dL — ABNORMAL HIGH (ref 70–99)
Glucose-Capillary: 148 mg/dL — ABNORMAL HIGH (ref 70–99)
Glucose-Capillary: 158 mg/dL — ABNORMAL HIGH (ref 70–99)

## 2022-06-12 LAB — CBC
HCT: 39.9 % (ref 36.0–46.0)
Hemoglobin: 12.8 g/dL (ref 12.0–15.0)
MCH: 28.2 pg (ref 26.0–34.0)
MCHC: 32.1 g/dL (ref 30.0–36.0)
MCV: 87.9 fL (ref 80.0–100.0)
Platelets: 284 10*3/uL (ref 150–400)
RBC: 4.54 MIL/uL (ref 3.87–5.11)
RDW: 16.4 % — ABNORMAL HIGH (ref 11.5–15.5)
WBC: 10.1 10*3/uL (ref 4.0–10.5)
nRBC: 0 % (ref 0.0–0.2)

## 2022-06-12 LAB — HEMOGLOBIN A1C
Hgb A1c MFr Bld: 5.7 % — ABNORMAL HIGH (ref 4.8–5.6)
Mean Plasma Glucose: 117 mg/dL

## 2022-06-12 MED ORDER — INSULIN ASPART 100 UNIT/ML IJ SOLN
0.0000 [IU] | INTRAMUSCULAR | Status: DC
  Administered 2022-06-12 (×2): 2 [IU] via SUBCUTANEOUS
  Administered 2022-06-12 – 2022-06-13 (×3): 1 [IU] via SUBCUTANEOUS
  Administered 2022-06-13 (×2): 2 [IU] via SUBCUTANEOUS
  Administered 2022-06-13: 1 [IU] via SUBCUTANEOUS
  Administered 2022-06-13 – 2022-06-15 (×8): 2 [IU] via SUBCUTANEOUS
  Administered 2022-06-15: 1 [IU] via SUBCUTANEOUS
  Administered 2022-06-15: 2 [IU] via SUBCUTANEOUS
  Administered 2022-06-15: 1 [IU] via SUBCUTANEOUS
  Administered 2022-06-16 (×2): 2 [IU] via SUBCUTANEOUS
  Administered 2022-06-16: 1 [IU] via SUBCUTANEOUS
  Administered 2022-06-16: 2 [IU] via SUBCUTANEOUS
  Administered 2022-06-17 (×2): 1 [IU] via SUBCUTANEOUS
  Administered 2022-06-17: 2 [IU] via SUBCUTANEOUS
  Administered 2022-06-17 (×2): 1 [IU] via SUBCUTANEOUS
  Administered 2022-06-18: 2 [IU] via SUBCUTANEOUS
  Administered 2022-06-18: 1 [IU] via SUBCUTANEOUS
  Administered 2022-06-18: 2 [IU] via SUBCUTANEOUS
  Administered 2022-06-18: 1 [IU] via SUBCUTANEOUS
  Administered 2022-06-18: 2 [IU] via SUBCUTANEOUS
  Administered 2022-06-19 – 2022-06-20 (×9): 3 [IU] via SUBCUTANEOUS
  Administered 2022-06-20 (×2): 2 [IU] via SUBCUTANEOUS
  Administered 2022-06-21 (×2): 5 [IU] via SUBCUTANEOUS
  Administered 2022-06-21 (×2): 3 [IU] via SUBCUTANEOUS
  Administered 2022-06-21: 5 [IU] via SUBCUTANEOUS
  Administered 2022-06-21: 3 [IU] via SUBCUTANEOUS
  Administered 2022-06-22 (×2): 2 [IU] via SUBCUTANEOUS
  Administered 2022-06-22: 3 [IU] via SUBCUTANEOUS
  Administered 2022-06-22 – 2022-06-23 (×2): 2 [IU] via SUBCUTANEOUS
  Administered 2022-06-23: 1 [IU] via SUBCUTANEOUS
  Administered 2022-06-23 (×2): 2 [IU] via SUBCUTANEOUS
  Administered 2022-06-24: 1 [IU] via SUBCUTANEOUS
  Administered 2022-06-24 (×3): 2 [IU] via SUBCUTANEOUS
  Administered 2022-06-24: 1 [IU] via SUBCUTANEOUS
  Administered 2022-06-24 – 2022-06-25 (×4): 2 [IU] via SUBCUTANEOUS
  Administered 2022-06-25: 3 [IU] via SUBCUTANEOUS
  Administered 2022-06-25 – 2022-06-26 (×5): 2 [IU] via SUBCUTANEOUS
  Administered 2022-06-26 (×2): 1 [IU] via SUBCUTANEOUS
  Administered 2022-06-27: 2 [IU] via SUBCUTANEOUS
  Administered 2022-06-27: 1 [IU] via SUBCUTANEOUS
  Administered 2022-06-27: 3 [IU] via SUBCUTANEOUS
  Administered 2022-06-28: 2 [IU] via SUBCUTANEOUS
  Administered 2022-06-28: 1 [IU] via SUBCUTANEOUS
  Administered 2022-06-28: 5 [IU] via SUBCUTANEOUS
  Administered 2022-06-28: 1 [IU] via SUBCUTANEOUS
  Administered 2022-06-28: 3 [IU] via SUBCUTANEOUS
  Administered 2022-06-29: 1 [IU] via SUBCUTANEOUS
  Administered 2022-06-29 – 2022-06-30 (×6): 2 [IU] via SUBCUTANEOUS
  Administered 2022-06-30: 1 [IU] via SUBCUTANEOUS
  Administered 2022-06-30 (×4): 2 [IU] via SUBCUTANEOUS
  Administered 2022-06-30: 3 [IU] via SUBCUTANEOUS
  Administered 2022-07-01 (×3): 2 [IU] via SUBCUTANEOUS
  Administered 2022-07-01: 1 [IU] via SUBCUTANEOUS
  Administered 2022-07-01 – 2022-07-02 (×4): 2 [IU] via SUBCUTANEOUS
  Administered 2022-07-02: 3 [IU] via SUBCUTANEOUS
  Administered 2022-07-02: 1 [IU] via SUBCUTANEOUS
  Administered 2022-07-02: 3 [IU] via SUBCUTANEOUS
  Administered 2022-07-03: 2 [IU] via SUBCUTANEOUS
  Administered 2022-07-03: 1 [IU] via SUBCUTANEOUS
  Administered 2022-07-03: 2 [IU] via SUBCUTANEOUS

## 2022-06-12 NOTE — Progress Notes (Incomplete)
Nutrition Follow-up  DOCUMENTATION CODES:   Not applicable  INTERVENTION:   Tube Feeds via Cortrak:  Jevity 1.2 at 60 mL/hr (1440 mL/day) 60 mL ProSource TF20 - daily  160 mL free water q6h Provides 1808 kcal, 100 gm protein, and 1802 mL free water daily. Continue Multivitamin w/ minerals daily  NUTRITION DIAGNOSIS:   Inadequate oral intake related to inability to eat as evidenced by NPO status. - Ongoing, being addressed via TF  GOAL:   Patient will meet greater than or equal to 90% of their needs - Being met via TF  MONITOR:   Diet advancement, Labs, TF tolerance, I & O's, Weight trends  REASON FOR ASSESSMENT:   Consult Enteral/tube feeding initiation and management  ASSESSMENT:   71 y.o. female presented to the ED with decreased consciousness from SNF. PMH includes CKD IV, MDD, and HTN. Pt admitted with sepsis secondary to PNA, AKI on CKD, acute liver failure, and elevated troponin.  11/22 - Cortrak placed (tip pyloric region)  ***  Medications reviewed and include: Vitamin D3, Ferrous Sulfate, NovoLog SSI, MVI Labs reviewed: 24 hr CBGs 137-178  Diet Order:   Diet Order             Diet NPO time specified  Diet effective now                   EDUCATION NEEDS:   Not appropriate for education at this time  Skin:  Skin Assessment: Reviewed RN Assessment  Last BM:  11/28  Height:   Ht Readings from Last 1 Encounters:  06/04/22 5' 6" (1.676 m)    Weight:   Wt Readings from Last 1 Encounters:  06/12/22 75.9 kg    Ideal Body Weight:  59.1 kg  BMI:  Body mass index is 27.01 kg/m.  Estimated Nutritional Needs:   Kcal:  1700-1900  Protein:  85-100 grams  Fluid:  >/= 1.7 L    Hermina Barters RD, LDN Clinical Dietitian See [facility] for contact information.

## 2022-06-12 NOTE — Progress Notes (Signed)
PROGRESS NOTE    Tara [name]  LJQ:[id number] DOB: [birth year] DOA: 06/04/2022 PCP: Biagio Borg, MD   Brief Narrative:  HPI: This is a 71 year old female nursing home resident with past medical history of CKD, depression, sarcoid, dyslipidemia, and hypertension.  Today the patient was noted to have decreased level of consciousness, 911 was called.   Patient brought to the ER, patient hypoxic into the 80s on room air.  Chest x-ray shows pneumonia.  She is currently on 4 L oxygen sats in the high 90s.  Patient remains lethargic and poorly responsive.  Patient does open her eyes when stimulated, she follows instructions moving extremities once asked.  She however will not talk and provides no history.  Patient is hemodynamically stable.  Assessment & Plan:   Principal Problem:   Sepsis due to pneumonia [facility]) Active Problems:   Sarcoidosis   Hyperlipidemia   Essential hypertension   Elevated troponin   Obstructive sleep apnea   Lactic acidosis   Acute kidney injury superimposed on chronic kidney disease (HCC)   Acute liver failure   Hypernatremia   Dehydration   Anxiety and depression   Paroxysmal atrial fibrillation with RVR (HCC)  Severe sepsis and acute respiratory failure with hypoxia due to community-acquired pneumonia [facility]): Patient meets criteria for severe sepsis based on fever 102, tachycardia, tachypnea, leukocytosis, acute hypoxic respiratory failure and lactic acid of 2.3.  Chest x-ray suggest pneumonia/aspiration pneumonitis.  Completed 5 days of cefepime. Patient currently on room air.  All cultures are negative so far.   Acute kidney injury superimposed on chronic kidney disease 3: Baseline creatinine appears to be around 1.2, presented with 1.86.  Now improved.  Hypokalemia: Resolved.   Acute liver failure ruled out: Patient has very minimally elevated LFTs, does not meet criteria for acute liver failure however elevated LFTs could very well be due to sepsis.   Improved.    Elevated troponin: Patient had elevated but essentially flat troponin with she is not having any chest pain or shortness of breath, likely demand ischemia.  Transthoracic echo ruled out wall motion abnormality, 55% ejection fraction.    Atrial fibrillation with RVR: Has history of paroxysmal atrial fibrillation, was in RVR, likely in the setting of sepsis.  Patient very weak.  Rates controlled.  Continue Cardizem, flecainide and Eliquis.  Will discontinue IV Lopressor that was started when she was NPO.    Hypernatremia: Improved.      Essential hypertension/CAD Blood pressure elevated.  Continue Hydralazine, Cardizem CD, Imdur.    Obstructive sleep apnea -Patient protecting airways however, too ill for CPAP.    Hyperlipidemia -she is not on any medications for his.   Sarcoidosis: Noted.  DVT prophylaxis: SCDs Start: 06/04/22 2221 heparin   Code Status: Full Code  Family Communication:  None present at bedside.  She says that she has no family around.  Her only sister lives in [place].  Status is: Inpatient Remains inpatient appropriate because: Patient unable to swallow.  SLP following.   Estimated body mass index is 27.01 kg/m as calculated from the following:   Height as of this encounter: '5\' 6"'$  (1.676 m).   Weight as of this encounter: 75.9 kg.    Nutritional Assessment: Body mass index is 27.01 kg/m.Marland Kitchen Seen by dietician.  I agree with the assessment and plan as outlined below: Nutrition Status: Nutrition Problem: Inadequate oral intake Etiology: inability to eat Signs/Symptoms: NPO status Interventions: Tube feeding  . Skin Assessment: I have examined the patient's skin and  I agree with the wound assessment as performed by the wound care RN as outlined below:    Consultants:  None  Procedures:  None  Antimicrobials:  Anti-infectives (From admission, onward)    Start     Dose/Rate Route Frequency Ordered Stop   06/06/22 1730  ceFEPIme  (MAXIPIME) 2 g in sodium chloride 0.9 % 100 mL IVPB        2 g 200 mL/hr over 30 Minutes Intravenous Every 12 hours 06/06/22 1627 06/09/22 1649   06/05/22 2200  vancomycin (VANCOREADY) IVPB 750 mg/150 mL  Status:  Discontinued        750 mg 150 mL/hr over 60 Minutes Intravenous Every 24 hours 06/04/22 2328 06/05/22 1019   06/04/22 2330  ceFEPIme (MAXIPIME) 2 g in sodium chloride 0.9 % 100 mL IVPB  Status:  Discontinued        2 g 200 mL/hr over 30 Minutes Intravenous Every 24 hours 06/04/22 2324 06/06/22 1627   06/04/22 2330  vancomycin (VANCOREADY) IVPB 1500 mg/300 mL        1,500 mg 150 mL/hr over 120 Minutes Intravenous  Once 06/04/22 2324 06/05/22 0233   06/04/22 2230  cefTRIAXone (ROCEPHIN) 2 g in sodium chloride 0.9 % 100 mL IVPB  Status:  Discontinued        2 g 200 mL/hr over 30 Minutes Intravenous Every 24 hours 06/04/22 2223 06/04/22 2224   06/04/22 2230  azithromycin (ZITHROMAX) 500 mg in sodium chloride 0.9 % 250 mL IVPB  Status:  Discontinued        500 mg 250 mL/hr over 60 Minutes Intravenous Every 24 hours 06/04/22 2223 06/04/22 2224   06/04/22 1730  cefTRIAXone (ROCEPHIN) 2 g in sodium chloride 0.9 % 100 mL IVPB  Status:  Discontinued        2 g 200 mL/hr over 30 Minutes Intravenous Every 24 hours 06/04/22 1725 06/04/22 2321   06/04/22 1730  azithromycin (ZITHROMAX) 500 mg in sodium chloride 0.9 % 250 mL IVPB  Status:  Discontinued        500 mg 250 mL/hr over 60 Minutes Intravenous Every 24 hours 06/04/22 1725 06/04/22 2321         Subjective:  Patient seen and examined.  Alert and oriented and no complaints.  Still unable to swallow.  Will need MBS eventually.  Objective: Vitals:   06/12/22 0443 06/12/22 0500 06/12/22 0800 06/12/22 0843  BP: 130/77  (!) 139/90 133/86  Pulse: 82  76   Resp: 20  (!) 25   Temp: 98.5 F (36.9 C)  98.1 F (36.7 C)   TempSrc: Oral  Oral   SpO2: 99%  97%   Weight:  75.9 kg    Height:        Intake/Output Summary (Last 24  hours) at 06/12/2022 1046 Last data filed at 06/12/2022 0442 Gross per 24 hour  Intake --  Output 700 ml  Net -700 ml    Filed Weights   06/09/22 0500 06/10/22 0400 06/12/22 0500  Weight: 74.1 kg 75.6 kg 75.9 kg    Examination:  General exam: Appears calm and comfortable , has coretrak. Respiratory system: Clear to auscultation. Respiratory effort normal. Cardiovascular system: S1 & S2 heard, RRR. No JVD, murmurs, rubs, gallops or clicks. No pedal edema. Gastrointestinal system: Abdomen is nondistended, soft and nontender. No organomegaly or masses felt. Normal bowel sounds heard. Central nervous system: Alert and oriented. No focal neurological deficits. Extremities: Symmetric 5 x 5 power. Skin: No rashes,  lesions or ulcers.  Psychiatry: Judgement and insight appear normal. Mood & affect appropriate.   Data Reviewed: I have personally reviewed following labs and imaging studies  CBC: Recent Labs  Lab 06/08/22 0229 06/09/22 0303 06/10/22 0256 06/11/22 0303 06/12/22 0555  WBC 7.5 6.9 7.9 9.2 10.1  HGB 13.9 13.1 12.3 12.8 12.8  HCT 41.0 39.2 39.3 38.1 39.9  MCV 84.4 85.6 88.7 85.8 87.9  PLT 151 216 208 234 017    Basic Metabolic Panel: Recent Labs  Lab 06/06/22 0644 06/06/22 1412 06/06/22 1817 06/07/22 0442 06/07/22 1631 06/08/22 0928 06/09/22 0303 06/10/22 0256  NA 145  --   --  145  --  144 143 142  K 3.2*  --   --  3.0* 3.2* 3.3* 4.2 3.9  CL 107  --   --  107  --  110 107 108  CO2 25  --   --  26  --  '26 26 28  '$ GLUCOSE 68*  --   --  122*  --  172* 160* 143*  BUN 16  --   --  16  --  '16 20 20  '$ CREATININE 0.91  --   --  0.85  --  0.83 0.85 0.81  CALCIUM 9.0  --   --  9.3  --  8.8* 9.1 9.0  MG 1.9 1.9 2.1 1.8 1.8 1.9  --   --   PHOS  --  2.7 2.6 1.9* 3.0  --   --   --     GFR: Estimated Creatinine Clearance: 68.2 mL/min (by C-G formula based on SCr of 0.81 mg/dL). Liver Function Tests: Recent Labs  Lab 06/06/22 0644 06/07/22 0442  AST 46* 37  ALT  54* 45*  ALKPHOS 63 75  BILITOT 0.9 0.7  PROT 5.9* 6.0*  ALBUMIN 2.7* 2.8*    No results for input(s): "LIPASE", "AMYLASE" in the last 168 hours. No results for input(s): "AMMONIA" in the last 168 hours.  Coagulation Profile: No results for input(s): "INR", "PROTIME" in the last 168 hours.  Cardiac Enzymes: No results for input(s): "CKTOTAL", "CKMB", "CKMBINDEX", "TROPONINI" in the last 168 hours. BNP (last 3 results) No results for input(s): "PROBNP" in the last 8760 hours. HbA1C: No results for input(s): "HGBA1C" in the last 72 hours. CBG: Recent Labs  Lab 06/11/22 1629 06/11/22 2105 06/11/22 2341 06/12/22 0351 06/12/22 0806  GLUCAP 186* 163* 178* 170* 174*    Lipid Profile: No results for input(s): "CHOL", "HDL", "LDLCALC", "TRIG", "CHOLHDL", "LDLDIRECT" in the last 72 hours. Thyroid Function Tests: No results for input(s): "TSH", "T4TOTAL", "FREET4", "T3FREE", "THYROIDAB" in the last 72 hours.  Anemia Panel: No results for input(s): "VITAMINB12", "FOLATE", "FERRITIN", "TIBC", "IRON", "RETICCTPCT" in the last 72 hours. Sepsis Labs: No results for input(s): "PROCALCITON", "LATICACIDVEN" in the last 168 hours.   Recent Results (from the past 240 hour(s))  Blood Culture (routine x 2)     Status: None   Collection Time: 06/04/22  5:03 PM   Specimen: BLOOD  Result Value Ref Range Status   Specimen Description BLOOD SITE NOT SPECIFIED  Final   Special Requests   Final    BOTTLES DRAWN AEROBIC AND ANAEROBIC Blood Culture adequate volume   Culture   Final    NO GROWTH 5 DAYS Performed at [facility], [address] [place], [place] [address]    Report Status 06/09/2022 FINAL  Final  Resp Panel by RT-PCR (Flu A&B, Covid) Anterior Nasal Swab  Status: None   Collection Time: 06/04/22  5:16 PM   Specimen: Anterior Nasal Swab  Result Value Ref Range Status   SARS Coronavirus 2 by RT PCR NEGATIVE NEGATIVE Final    Comment: (NOTE) SARS-CoV-2 target nucleic  acids are NOT DETECTED.  The SARS-CoV-2 RNA is generally detectable in upper respiratory specimens during the acute phase of infection. The lowest concentration of SARS-CoV-2 viral copies this assay can detect is 138 copies/mL. A negative result does not preclude SARS-Cov-2 infection and should not be used as the sole basis for treatment or other patient management decisions. A negative result may occur with  improper specimen collection/handling, submission of specimen other than nasopharyngeal swab, presence of viral mutation(s) within the areas targeted by this assay, and inadequate number of viral copies(<138 copies/mL). A negative result must be combined with clinical observations, patient history, and epidemiological information. The expected result is Negative.  Fact Sheet for Patients:  EntrepreneurPulse.com.au  Fact Sheet for Healthcare Providers:  IncredibleEmployment.be  This test is no t yet approved or cleared by the [place] FDA and  has been authorized for detection and/or diagnosis of SARS-CoV-2 by FDA under an Emergency Use Authorization (EUA). This EUA will remain  in effect (meaning this test can be used) for the duration of the COVID-19 declaration under Section 564(b)(1) of the Act, 21 U.S.C.section 360bbb-3(b)(1), unless the authorization is terminated  or revoked sooner.       Influenza A by PCR NEGATIVE NEGATIVE Final   Influenza B by PCR NEGATIVE NEGATIVE Final    Comment: (NOTE) The Xpert Xpress SARS-CoV-2/FLU/RSV plus assay is intended as an aid in the diagnosis of influenza from Nasopharyngeal swab specimens and should not be used as a sole basis for treatment. Nasal washings and aspirates are unacceptable for Xpert Xpress SARS-CoV-2/FLU/RSV testing.  Fact Sheet for Patients: EntrepreneurPulse.com.au  Fact Sheet for Healthcare Providers: IncredibleEmployment.be  This  test is not yet approved or cleared by the [place] FDA and has been authorized for detection and/or diagnosis of SARS-CoV-2 by FDA under an Emergency Use Authorization (EUA). This EUA will remain in effect (meaning this test can be used) for the duration of the COVID-19 declaration under Section 564(b)(1) of the Act, 21 U.S.C. section 360bbb-3(b)(1), unless the authorization is terminated or revoked.  Performed at [facility], [place] [address] [place], [place] [address]   Urine Culture     Status: Abnormal   Collection Time: 06/04/22 10:00 PM   Specimen: Urine, Clean Catch  Result Value Ref Range Status   Specimen Description URINE, CLEAN CATCH  Final   Special Requests   Final    NONE Performed at [facility], [place] [address], [place], [place] [address]    Culture MULTIPLE SPECIES PRESENT, SUGGEST RECOLLECTION (A)  Final   Report Status 06/06/2022 FINAL  Final  MRSA Next Gen by PCR, Nasal     Status: None   Collection Time: 06/05/22 12:30 AM   Specimen: Nasal Mucosa; Nasal Swab  Result Value Ref Range Status   MRSA by PCR Next Gen NOT DETECTED NOT DETECTED Final    Comment: (NOTE) The GeneXpert MRSA Assay (FDA approved for NASAL specimens only), is one component of a comprehensive MRSA colonization surveillance program. It is not intended to diagnose MRSA infection nor to guide or monitor treatment for MRSA infections. Test performance is not FDA approved in patients less than 44 years old. Performed at [facility], [place],  [place] [address]      Radiology Studies: No results found.  Scheduled Meds:  apixaban  5 mg Per Tube BID   brimonidine  1 drop Both Eyes BID   And   timolol  1 drop Both Eyes BID   cholecalciferol  1,000 Units Per Tube Daily   diltiazem  60 mg Per Tube Q6H   feeding supplement (PROSource TF20)  60 mL Per Tube Daily   ferrous sulfate  300 mg Per Tube Q breakfast   flecainide  50 mg Per Tube BID    hydrALAZINE  100 mg Per Tube TID   insulin aspart  0-9 Units Subcutaneous Q4H   isosorbide dinitrate  10 mg Per Tube TID   latanoprost  1 drop Both Eyes QHS   metoprolol tartrate  5 mg Intravenous Q6H   multivitamin with minerals  1 tablet Per Tube Daily   mouth rinse  15 mL Mouth Rinse 4 times per day   valbenazine  40 mg Oral Daily   Continuous Infusions:  feeding supplement (JEVITY 1.2 CAL) 1,000 mL (06/11/22 0135)     LOS: 8 days   Darliss Cheney, MD [facility]  06/12/2022, 10:46 AM   *Please note that this is a verbal dictation therefore any spelling or grammatical errors are due to the "[place] [employer]" system interpretation.  Please page via [place] and do not message via secure chat for urgent patient care matters. Secure chat can be used for non urgent patient care matters.  How to contact the [facility] Attending or Consulting provider [place] or covering provider during after hours [place], for this patient?  Check the care team in [facility] and look for a) attending/consulting TRH provider listed and b) the [facility] team listed. Page or secure chat [phone]. Log into [email or web address] and use 's universal password to access. If you do not have the password, please contact the hospital operator. Locate the [facility] provider you are looking for under [facility] and page to a number that you can be directly reached. If you still have difficulty reaching the provider, please page the [facility] (Director on Call) for the Hospitalists listed on amion for assistance.

## 2022-06-12 NOTE — Progress Notes (Signed)
Speech Language Pathology Treatment: Dysphagia  Patient Details Name: Tara [name] MRN: [id number] DOB: 1950-08-12 Today's Date: 06/12/2022 Time: 1450-1505 SLP Time Calculation (min) (ACUTE ONLY): 15 min  Assessment / Plan / Recommendation Clinical Impression  Patient seen by SLP for skilled treatment focused on dysphagia goals. Patient was awake and alert, told SLP upon entering that "my face hurts" but she did not respond to any further questions and did not verbally communicate or interact with SLP after that. She was constantly blinking eyes during session. SLP performed oral care and removed a small amount of dry, sticky secretions on hard palate and tongue. Patient was then receptive to sipping water from spoon which SLP presented to her lips. She exhibited oral holding, suspected delayed swallow initiation and multiple swallows (not more than 2 extra swallows). No overt s/s aspiration or penetration observed. Patient does not appear ready for MBS at this time and in addition, she appears to be at significant risk of malnutrition and dehydration even if she was able to be safely advanced to a PO diet. SLP will continue to follow patient for PO readiness.    HPI: This is a 71 year old female nursing home resident with past medical history of CKD, depression, sarcoid, dyslipidemia, and hypertension. Severe sepsis and acute respiratory failure with hypoxia due to community-acquired pneumonia; ST f/u for dysphagia tx/assessment.      SLP Plan  Continue with current plan of care      Recommendations for follow up therapy are one component of a multi-disciplinary discharge planning process, led by the attending physician.  Recommendations may be updated based on patient status, additional functional criteria and insurance authorization.    Recommendations  Diet recommendations: NPO Medication Administration: Via alternative means                Oral Care Recommendations: Oral care  BID;Staff/trained caregiver to provide oral care Follow Up Recommendations: Skilled nursing-short term rehab (<3 hours/day) Assistance recommended at discharge: Frequent or constant Supervision/Assistance SLP Visit Diagnosis: Dysphagia, unspecified (R13.10) Plan: Continue with current plan of care           Sonia Baller, MA, CCC-SLP Speech Therapy

## 2022-06-12 NOTE — Plan of Care (Signed)

## 2022-06-13 ENCOUNTER — Inpatient Hospital Stay (HOSPITAL_COMMUNITY): Payer: BC Managed Care – PPO

## 2022-06-13 DIAGNOSIS — N179 Acute kidney failure, unspecified: Secondary | ICD-10-CM | POA: Diagnosis not present

## 2022-06-13 DIAGNOSIS — N189 Chronic kidney disease, unspecified: Secondary | ICD-10-CM | POA: Diagnosis not present

## 2022-06-13 DIAGNOSIS — A419 Sepsis, unspecified organism: Secondary | ICD-10-CM | POA: Diagnosis not present

## 2022-06-13 DIAGNOSIS — J189 Pneumonia, unspecified organism: Secondary | ICD-10-CM | POA: Diagnosis not present

## 2022-06-13 LAB — CBC
HCT: 39.3 % (ref 36.0–46.0)
Hemoglobin: 12.6 g/dL (ref 12.0–15.0)
MCH: 28.3 pg (ref 26.0–34.0)
MCHC: 32.1 g/dL (ref 30.0–36.0)
MCV: 88.3 fL (ref 80.0–100.0)
Platelets: 267 10*3/uL (ref 150–400)
RBC: 4.45 MIL/uL (ref 3.87–5.11)
RDW: 16.2 % — ABNORMAL HIGH (ref 11.5–15.5)
WBC: 9.7 10*3/uL (ref 4.0–10.5)
nRBC: 0 % (ref 0.0–0.2)

## 2022-06-13 LAB — GLUCOSE, CAPILLARY
Glucose-Capillary: 118 mg/dL — ABNORMAL HIGH (ref 70–99)
Glucose-Capillary: 128 mg/dL — ABNORMAL HIGH (ref 70–99)
Glucose-Capillary: 132 mg/dL — ABNORMAL HIGH (ref 70–99)
Glucose-Capillary: 149 mg/dL — ABNORMAL HIGH (ref 70–99)
Glucose-Capillary: 157 mg/dL — ABNORMAL HIGH (ref 70–99)
Glucose-Capillary: 168 mg/dL — ABNORMAL HIGH (ref 70–99)
Glucose-Capillary: 186 mg/dL — ABNORMAL HIGH (ref 70–99)

## 2022-06-13 NOTE — Progress Notes (Signed)
PROGRESS NOTE    Tara [name]  ZJI:[id number] DOB: Jun 03, 1951 DOA: 06/04/2022 PCP: Biagio Borg, MD   Brief Narrative:   HPI: This is a 71 year old female nursing home resident with past medical history of CKD, depression, sarcoid, dyslipidemia, and hypertension.  Today the patient was noted to have decreased level of consciousness, 911 was called.   Patient brought to the ER, patient hypoxic into the 80s on room air.  Chest x-ray shows pneumonia.  She is currently on 4 L oxygen sats in the high 90s.  Patient remains lethargic and poorly responsive.  Patient does open her eyes when stimulated, she follows instructions moving extremities once asked.  She however will not talk and provides no history.  Patient is hemodynamically stable.  Assessment & Plan:   Principal Problem:   Sepsis due to pneumonia [facility]) Active Problems:   Sarcoidosis   Hyperlipidemia   Essential hypertension   Elevated troponin   Obstructive sleep apnea   Lactic acidosis   Acute kidney injury superimposed on chronic kidney disease (HCC)   Acute liver failure   Hypernatremia   Dehydration   Anxiety and depression   Paroxysmal atrial fibrillation with RVR (HCC)  Severe sepsis and acute respiratory failure with hypoxia due to community-acquired pneumonia [facility]): -  Patient meets criteria for severe sepsis based on fever 102, tachycardia, tachypnea, leukocytosis, acute hypoxic respiratory failure and lactic acid of 2.3.  Chest x-ray suggest pneumonia/aspiration pneumonitis.  Completed 5 days of cefepime. Patient currently on room air.  All cultures are negative so far.  Dysphagia -She remains n.p.o., only on tube feed, I have discussed with selfie, they will reevaluate today, I have discussed with her if she continues to fail her swallow evaluation then eventually she will need PEG.   Acute kidney injury superimposed on chronic kidney disease 3: - Baseline creatinine appears to be around 1.2, presented with  1.86.  Now improved.  Hypokalemia: Resolved.   Acute liver failure ruled out: Patient has very minimally elevated LFTs, does not meet criteria for acute liver failure however elevated LFTs could very well be due to sepsis.  Improved.    Elevated troponin: Patient had elevated but essentially flat troponin with she is not having any chest pain or shortness of breath, likely demand ischemia.  Transthoracic echo ruled out wall motion abnormality, 55% ejection fraction.    Atrial fibrillation with RVR: Has history of paroxysmal atrial fibrillation, was in RVR, likely in the setting of sepsis.  Patient very weak.  Rates controlled.  Continue Cardizem, flecainide and Eliquis.  Will discontinue IV Lopressor that was started when she was NPO.    Hypernatremia: Improved.      Essential hypertension/CAD Blood pressure elevated.  Continue Hydralazine, Cardizem CD, Imdur.    Obstructive sleep apnea -Patient protecting airways however, too ill for CPAP.    Hyperlipidemia -she is not on any medications for his.   Sarcoidosis: Noted.  DVT prophylaxis: SCDs Start: 06/04/22 2221 heparin   Code Status: Full Code  Family Communication:  None present at bedside.   Status is: Inpatient Remains inpatient appropriate because: Patient unable to swallow.  SLP following.   Estimated body mass index is 27.01 kg/m as calculated from the following:   Height as of this encounter: '5\' 6"'$  (1.676 m).   Weight as of this encounter: 75.9 kg.    Nutritional Assessment: Body mass index is 27.01 kg/m.Marland Kitchen Seen by dietician.  I agree with the assessment and plan as outlined below: Nutrition Status:  Nutrition Problem: Inadequate oral intake Etiology: inability to eat Signs/Symptoms: NPO status Interventions: Tube feeding  . Skin Assessment: I have examined the patient's skin and I agree with the wound assessment as performed by the wound care RN as outlined below:    Consultants:  None  Procedures:   None  Antimicrobials:  Anti-infectives (From admission, onward)    Start     Dose/Rate Route Frequency Ordered Stop   06/06/22 1730  ceFEPIme (MAXIPIME) 2 g in sodium chloride 0.9 % 100 mL IVPB        2 g 200 mL/hr over 30 Minutes Intravenous Every 12 hours 06/06/22 1627 06/09/22 1649   06/05/22 2200  vancomycin (VANCOREADY) IVPB 750 mg/150 mL  Status:  Discontinued        750 mg 150 mL/hr over 60 Minutes Intravenous Every 24 hours 06/04/22 2328 06/05/22 1019   06/04/22 2330  ceFEPIme (MAXIPIME) 2 g in sodium chloride 0.9 % 100 mL IVPB  Status:  Discontinued        2 g 200 mL/hr over 30 Minutes Intravenous Every 24 hours 06/04/22 2324 06/06/22 1627   06/04/22 2330  vancomycin (VANCOREADY) IVPB 1500 mg/300 mL        1,500 mg 150 mL/hr over 120 Minutes Intravenous  Once 06/04/22 2324 06/05/22 0233   06/04/22 2230  cefTRIAXone (ROCEPHIN) 2 g in sodium chloride 0.9 % 100 mL IVPB  Status:  Discontinued        2 g 200 mL/hr over 30 Minutes Intravenous Every 24 hours 06/04/22 2223 06/04/22 2224   06/04/22 2230  azithromycin (ZITHROMAX) 500 mg in sodium chloride 0.9 % 250 mL IVPB  Status:  Discontinued        500 mg 250 mL/hr over 60 Minutes Intravenous Every 24 hours 06/04/22 2223 06/04/22 2224   06/04/22 1730  cefTRIAXone (ROCEPHIN) 2 g in sodium chloride 0.9 % 100 mL IVPB  Status:  Discontinued        2 g 200 mL/hr over 30 Minutes Intravenous Every 24 hours 06/04/22 1725 06/04/22 2321   06/04/22 1730  azithromycin (ZITHROMAX) 500 mg in sodium chloride 0.9 % 250 mL IVPB  Status:  Discontinued        500 mg 250 mL/hr over 60 Minutes Intravenous Every 24 hours 06/04/22 1725 06/04/22 2321         Subjective:  Patient seen and examined.  Alert and oriented and no complaints.  Still unable to swallow.  Will need MBS eventually.  Objective: Vitals:   06/13/22 0034 06/13/22 0432 06/13/22 0521 06/13/22 0836  BP: (!) 100/54 104/74 126/75 (!) 148/88  Pulse: 72 78    Resp: 19 (!) 21     Temp: 98.8 F (37.1 C) 98.9 F (37.2 C)    TempSrc: Oral Oral    SpO2: 95% 97%    Weight:      Height:        Intake/Output Summary (Last 24 hours) at 06/13/2022 1009 Last data filed at 06/12/2022 1537 Gross per 24 hour  Intake --  Output 450 ml  Net -450 ml   Filed Weights   06/09/22 0500 06/10/22 0400 06/12/22 0500  Weight: 74.1 kg 75.6 kg 75.9 kg    Examination:  Awake Alert, Oriented X 3, appears to be appropriate, with intact judgment and insight, but sometimes had to repeat question multiple times before she answers it, keep blinking her eyes frequently during my interaction. Symmetrical Chest wall movement, Good air movement bilaterally, CTAB RRR,No Gallops,Rubs  or new Murmurs, No Parasternal Heave +ve B.Sounds, Abd Soft, No tenderness, No rebound - guarding or rigidity.  Data Reviewed: I have personally reviewed following labs and imaging studies  CBC: Recent Labs  Lab 06/09/22 0303 06/10/22 0256 06/11/22 0303 06/12/22 0555 06/13/22 0810  WBC 6.9 7.9 9.2 10.1 9.7  HGB 13.1 12.3 12.8 12.8 12.6  HCT 39.2 39.3 38.1 39.9 39.3  MCV 85.6 88.7 85.8 87.9 88.3  PLT 216 208 234 284 716   Basic Metabolic Panel: Recent Labs  Lab 06/06/22 1412 06/06/22 1817 06/07/22 0442 06/07/22 1631 06/08/22 0928 06/09/22 0303 06/10/22 0256  NA  --   --  145  --  144 143 142  K  --   --  3.0* 3.2* 3.3* 4.2 3.9  CL  --   --  107  --  110 107 108  CO2  --   --  26  --  '26 26 28  '$ GLUCOSE  --   --  122*  --  172* 160* 143*  BUN  --   --  16  --  '16 20 20  '$ CREATININE  --   --  0.85  --  0.83 0.85 0.81  CALCIUM  --   --  9.3  --  8.8* 9.1 9.0  MG 1.9 2.1 1.8 1.8 1.9  --   --   PHOS 2.7 2.6 1.9* 3.0  --   --   --    GFR: Estimated Creatinine Clearance: 68.2 mL/min (by C-G formula based on SCr of 0.81 mg/dL). Liver Function Tests: Recent Labs  Lab 06/07/22 0442  AST 37  ALT 45*  ALKPHOS 75  BILITOT 0.7  PROT 6.0*  ALBUMIN 2.8*   No results for input(s): "LIPASE",  "AMYLASE" in the last 168 hours. No results for input(s): "AMMONIA" in the last 168 hours.  Coagulation Profile: No results for input(s): "INR", "PROTIME" in the last 168 hours.  Cardiac Enzymes: No results for input(s): "CKTOTAL", "CKMB", "CKMBINDEX", "TROPONINI" in the last 168 hours. BNP (last 3 results) No results for input(s): "PROBNP" in the last 8760 hours. HbA1C: Recent Labs    06/12/22 0555  HGBA1C 5.7*   CBG: Recent Labs  Lab 06/12/22 2103 06/13/22 0040 06/13/22 0159 06/13/22 0406 06/13/22 0809  GLUCAP 158* 149* 128* 157* 168*   Lipid Profile: No results for input(s): "CHOL", "HDL", "LDLCALC", "TRIG", "CHOLHDL", "LDLDIRECT" in the last 72 hours. Thyroid Function Tests: No results for input(s): "TSH", "T4TOTAL", "FREET4", "T3FREE", "THYROIDAB" in the last 72 hours.  Anemia Panel: No results for input(s): "VITAMINB12", "FOLATE", "FERRITIN", "TIBC", "IRON", "RETICCTPCT" in the last 72 hours. Sepsis Labs: No results for input(s): "PROCALCITON", "LATICACIDVEN" in the last 168 hours.   Recent Results (from the past 240 hour(s))  Blood Culture (routine x 2)     Status: None   Collection Time: 06/04/22  5:03 PM   Specimen: BLOOD  Result Value Ref Range Status   Specimen Description BLOOD SITE NOT SPECIFIED  Final   Special Requests   Final    BOTTLES DRAWN AEROBIC AND ANAEROBIC Blood Culture adequate volume   Culture   Final    NO GROWTH 5 DAYS Performed at [facility], [address], [place], [place] [address]    Report Status 06/09/2022 FINAL  Final  Resp Panel by RT-PCR (Flu A&B, Covid) Anterior Nasal Swab     Status: None   Collection Time: 06/04/22  5:16 PM   Specimen: Anterior Nasal Swab  Result  Value Ref Range Status   SARS Coronavirus 2 by RT PCR NEGATIVE NEGATIVE Final    Comment: (NOTE) SARS-CoV-2 target nucleic acids are NOT DETECTED.  The SARS-CoV-2 RNA is generally detectable in upper respiratory specimens during the acute phase of  infection. The lowest concentration of SARS-CoV-2 viral copies this assay can detect is 138 copies/mL. A negative result does not preclude SARS-Cov-2 infection and should not be used as the sole basis for treatment or other patient management decisions. A negative result may occur with  improper specimen collection/handling, submission of specimen other than nasopharyngeal swab, presence of viral mutation(s) within the areas targeted by this assay, and inadequate number of viral copies(<138 copies/mL). A negative result must be combined with clinical observations, patient history, and epidemiological information. The expected result is Negative.  Fact Sheet for Patients:  EntrepreneurPulse.com.au  Fact Sheet for Healthcare Providers:  IncredibleEmployment.be  This test is no t yet approved or cleared by the [place] FDA and  has been authorized for detection and/or diagnosis of SARS-CoV-2 by FDA under an Emergency Use Authorization (EUA). This EUA will remain  in effect (meaning this test can be used) for the duration of the COVID-19 declaration under Section 564(b)(1) of the Act, 21 U.S.C.section 360bbb-3(b)(1), unless the authorization is terminated  or revoked sooner.       Influenza A by PCR NEGATIVE NEGATIVE Final   Influenza B by PCR NEGATIVE NEGATIVE Final    Comment: (NOTE) The Xpert Xpress SARS-CoV-2/FLU/RSV plus assay is intended as an aid in the diagnosis of influenza from Nasopharyngeal swab specimens and should not be used as a sole basis for treatment. Nasal washings and aspirates are unacceptable for Xpert Xpress SARS-CoV-2/FLU/RSV testing.  Fact Sheet for Patients: EntrepreneurPulse.com.au  Fact Sheet for Healthcare Providers: IncredibleEmployment.be  This test is not yet approved or cleared by the [place] FDA and has been authorized for detection and/or diagnosis of SARS-CoV-2  by FDA under an Emergency Use Authorization (EUA). This EUA will remain in effect (meaning this test can be used) for the duration of the COVID-19 declaration under Section 564(b)(1) of the Act, 21 U.S.C. section 360bbb-3(b)(1), unless the authorization is terminated or revoked.  Performed at [facility], [place] [address] [place], [place] [address]   Urine Culture     Status: Abnormal   Collection Time: 06/04/22 10:00 PM   Specimen: Urine, Clean Catch  Result Value Ref Range Status   Specimen Description URINE, CLEAN CATCH  Final   Special Requests   Final    NONE Performed at [facility], [place] [address], [place], [place] [address]    Culture MULTIPLE SPECIES PRESENT, SUGGEST RECOLLECTION (A)  Final   Report Status 06/06/2022 FINAL  Final  MRSA Next Gen by PCR, Nasal     Status: None   Collection Time: 06/05/22 12:30 AM   Specimen: Nasal Mucosa; Nasal Swab  Result Value Ref Range Status   MRSA by PCR Next Gen NOT DETECTED NOT DETECTED Final    Comment: (NOTE) The GeneXpert MRSA Assay (FDA approved for NASAL specimens only), is one component of a comprehensive MRSA colonization surveillance program. It is not intended to diagnose MRSA infection nor to guide or monitor treatment for MRSA infections. Test performance is not FDA approved in patients less than 78 years old. Performed at [facility], [place] [address], [place], [place] [address]      Radiology Studies: No results found.  Scheduled Meds:  apixaban  5 mg Per Tube BID   brimonidine  1 drop Both Eyes BID   And   timolol  1 drop Both Eyes BID   cholecalciferol  1,000 Units Per Tube Daily   diltiazem  60 mg Per Tube Q6H   feeding supplement (PROSource TF20)  60 mL Per Tube Daily   ferrous sulfate  300 mg Per Tube Q breakfast   flecainide  50 mg Per Tube BID   hydrALAZINE  100 mg Per Tube TID   insulin aspart  0-9 Units Subcutaneous Q4H   isosorbide dinitrate  10 mg Per Tube TID   latanoprost   1 drop Both Eyes QHS   multivitamin with minerals  1 tablet Per Tube Daily   mouth rinse  15 mL Mouth Rinse 4 times per day   valbenazine  40 mg Oral Daily   Continuous Infusions:  feeding supplement (JEVITY 1.2 CAL) 1,000 mL (06/12/22 1556)     LOS: 9 days   Phillips Climes, MD [facility]  06/13/2022, 10:09 AM   *Please note that this is a verbal dictation therefore any spelling or grammatical errors are due to the "[place]" system interpretation.  Please page via [place] and do not message via secure chat for urgent patient care matters. Secure chat can be used for non urgent patient care matters.  How to contact the [facility] Attending or Consulting provider [place] or covering provider during after hours [place], for this patient?  Check the care team in [facility] and look for a) attending/consulting TRH provider listed and b) the [facility] team listed. Page or secure chat [phone]. Log into [email or web address] and use [facility]'s universal password to access. If you do not have the password, please contact the hospital operator. Locate the [facility] provider you are looking for under [facility] and page to a number that you can be directly reached. If you still have difficulty reaching the provider, please page the [facility] (Director on Call) for the Hospitalists listed on amion for assistance.

## 2022-06-13 NOTE — Progress Notes (Signed)
Speech Language Pathology Treatment: Dysphagia  Patient Details Name: Tara [name] MRN: [id number] DOB: [birth year] Today's Date: 06/13/2022 Time: 6381-7711 SLP Time Calculation (min) (ACUTE ONLY): 20 min  Assessment / Plan / Recommendation Clinical Impression  Patient seen by SLP for skilled treatment focused on dysphagia goals. Patient was awake and alert but continues to complain of pain head/face. Today she would respond to questions, telling SLP "its not quite a migraine" and "its like Candy Crush is playing in front of my face". SLP performed oral care and patient was receptive to drinking water via straw. She was able to hold cup when cued and encouraged in left hand. Aside from suspected mild delays in oral transit and pharyngeal phase swallow initiation, no overt s/s aspiration or penetration and no change in voice or vitals. SLP is recommending to initiate PO diet of full liquids at this time and recommend that patient be supervised, assisted and encouraged to consume PO's. SLP will continue to follow for diet toleration and ability to upgrade versus need for objective swallow study.    HPI HPI: This is a 71 year old female nursing home resident with past medical history of CKD, depression, sarcoid, dyslipidemia, and hypertension. Severe sepsis and acute respiratory failure with hypoxia due to community-acquired pneumonia; ST f/u for dysphagia tx/assessment.      SLP Plan  Continue with current plan of care      Recommendations for follow up therapy are one component of a multi-disciplinary discharge planning process, led by the attending physician.  Recommendations may be updated based on patient status, additional functional criteria and insurance authorization.    Recommendations  Diet recommendations: Thin liquid;Other(comment) (full liquids) Liquids provided via: Straw;Cup Medication Administration: Crushed with puree Supervision: Full supervision/cueing for compensatory  strategies;Staff to assist with self feeding Compensations: Small sips/bites;Slow rate;Minimize environmental distractions Postural Changes and/or Swallow Maneuvers: Seated upright 90 degrees                Oral Care Recommendations: Oral care BID;Staff/trained caregiver to provide oral care Follow Up Recommendations: Skilled nursing-short term rehab (<3 hours/day) Assistance recommended at discharge: Frequent or constant Supervision/Assistance SLP Visit Diagnosis: Dysphagia, unspecified (R13.10) Plan: Continue with current plan of care          Sonia Baller, MA, CCC-SLP Speech Therapy

## 2022-06-13 NOTE — Progress Notes (Signed)
Nutrition Follow-up  DOCUMENTATION CODES:   Not applicable  INTERVENTION:   Tube Feeds via Cortrak:  Jevity 1.2 at 60 mL/hr (1440 mL/day) 60 mL ProSource TF20 - daily  160 mL free water q6h Provides 1808 kcal, 100 gm protein, and 1802 mL free water daily. Meal ordering with assist  NUTRITION DIAGNOSIS:   Inadequate oral intake related to inability to eat as evidenced by NPO status. - Being addressed via TF  GOAL:   Patient will meet greater than or equal to 90% of their needs - Being met via TF  MONITOR:   Diet advancement, Labs, TF tolerance, I & O's, Weight trends  REASON FOR ASSESSMENT:   Consult Enteral/tube feeding initiation and management  ASSESSMENT:   71 y.o. female presented to the ED with decreased consciousness from SNF. PMH includes CKD IV, MDD, and HTN. Pt admitted with sepsis secondary to PNA, AKI on CKD, acute liver failure, and elevated troponin.  11/22 - Cortrak placed (tip pyloric region) 11/29 - diet advanced to full liquids   Pt laying in bed, was a bit slower to respond to RD than previous visit. Pt denies nay nausea or vomiting. Reports tolerating her tube feeds. Tube feeds running at goal.  Discussed case with RN.  SLP was able to advance diet  to full liquids after RD visit. Recommend continuing with tube feeds via Cortrak. If unable to advance diet, may need to consider PEG placement.   Medications reviewed and include: Vitamin D3, Ferrous Sulfate, NovoLog SSI, MVI  Labs reviewed: 24 hr CBGs 128-168  Diet Order:   Diet Order             Diet full liquid Room service appropriate? Yes; Fluid consistency: Thin  Diet effective now                   EDUCATION NEEDS:   Not appropriate for education at this time  Skin:  Skin Assessment: Reviewed RN Assessment  Last BM:  11/28 - Type 6  Height:   Ht Readings from Last 1 Encounters:  06/04/22 _0  (1.676 m)    Weight:   Wt Readings from Last 1 Encounters:  06/12/22 75.9 kg     Ideal Body Weight:  59.1 kg  BMI:  Body mass index is 27.01 kg/m.  Estimated Nutritional Needs:   Kcal:  1700-1900  Protein:  85-100 grams  Fluid:  >/= 1.7 L    Hermina Barters RD, LDN Clinical Dietitian See [facility] for contact information.

## 2022-06-13 NOTE — Progress Notes (Signed)
Physical Therapy Treatment Patient Details Name: Tara [name] MRN: [id number] DOB: 1951-02-28 Today's Date: 06/13/2022   History of Present Illness 71 y.o. female admitted from [facility] with decreased responsiveness and not eating or drinking. Pt with multiple recent admission for weakness and falls. Patient recently had cardiology follow up, placed on zio patch on 03/26/22 for 2 weeks. Patient  since 9/11 has had 3 ED visit for non-intractable HA,last of which was 9/16 for which she was evaluated ,treated and discharged home in improve condition. PMH significant for CKD, sarcoidosis, HTN, obesity,OSA on CPAP, atrial fibrillation on Eliquis, hx of presyncope seen in ed 3 x since 8/29.    PT Comments    Patient seen for physical therapy session, alert but needing extra time to process with delayed responses and initiation to questions/mobility. Pt blinking continuously and eyes appear to be rolling upward but larger range than typically seen with nystagmus. Pt unable to maintain eyes open when asked and when assisted to maintain open eyes pt unable to track therapist's finger visually. While rolling however pt's blinking stops and she is able to maintain eyes open. Sit<>supine completed and pulse oximeter pulse reading as high as 174 bpm however telemetry leads in 90's. Deferred OOB. Extremity assessment revealed slightly weaker Rt UE and LE than Lt. Also pt noted to have spasticity in bil UE/LE with Rt>Lt as well. MD and RN notified. Will continue to progress pt as able and continue to recommend SNF rehab.   Recommendations for follow up therapy are one component of a multi-disciplinary discharge planning process, led by the attending physician.  Recommendations may be updated based on patient status, additional functional criteria and insurance authorization.  Follow Up Recommendations  Skilled nursing-short term rehab (<3 hours/day) Can patient physically be transported by private vehicle:  No   Assistance Recommended at Discharge Frequent or constant Supervision/Assistance  Patient can return home with the following Two people to help with walking and/or transfers;Assist for transportation;A lot of help with bathing/dressing/bathroom;Help with stairs or ramp for entrance   Equipment Recommendations  None recommended by PT    Recommendations for Other Services       Precautions / Restrictions Precautions Precautions: Fall Precaution Comments: watch HR and BP; cortrak Restrictions Weight Bearing Restrictions: No     Mobility  Bed Mobility Overal bed mobility: Needs Assistance Bed Mobility: Supine to Sit, Sit to Supine, Rolling Rolling: Min assist, Mod assist   Supine to sit: Min assist, HOB elevated Sit to supine: Min assist   General bed mobility comments: Cues to reach for bed rail and walk LE's off EOB. Assist to raise trunk and with bed pad to fully scoot to EOB. pt fatigued sitting EOB with slouched posture. Pt reported some dizziness. Pt required min assist to return to supine. Min assist for rolling at start but pt progressed to mod assist.    Transfers                   General transfer comment: deferred due to tachycardia and pt reports of dizziness and increased weakness noted.    Ambulation/Gait                   Stairs             Wheelchair Mobility    Modified Rankin (Stroke Patients Only)       Balance Overall balance assessment: Needs assistance Sitting-balance support: Feet supported, Bilateral upper extremity supported Sitting balance-Leahy Scale: Fair  Cognition Arousal/Alertness: Awake/alert Behavior During Therapy: Flat affect Overall Cognitive Status: No family/caregiver present to determine baseline cognitive functioning Area of Impairment: Problem solving, Following commands                       Following Commands: Follows one step  commands with increased time, Follows one step commands consistently, Follows multi-step commands with increased time, Follows multi-step commands inconsistently     Problem Solving: Slow processing, Requires verbal cues General Comments: delayed responses and limited short responses to questions throughout.        06/13/22 1351  Upper Extremity Assessment  RUE Deficits / Details 3+/5 for strength grossly, pt with hypertonicity  LUE Deficits / Details 4-/5 for strength grossly, slight hypertonicity however not as strong as Rt UE.  Lower Extremity Assessment  Lower Extremity Assessment RLE deficits/detail;LLE deficits/detail  RLE Deficits / Details Limited knee flexion due to increased quadriceps tone, adductor tone noted with medial drift during SLR; ankle resing in plantar flexion  RLE Sensation history of peripheral neuropathy  LLE Deficits / Details ROM WNL for knee flexion; ankle resing in plantar flexion  LLE Sensation history of peripheral neuropathy     Exercises      General Comments        Pertinent Vitals/Pain Pain Assessment Pain Assessment: Faces Faces Pain Scale: Hurts a little bit Pain Location: headache "like a migraine" Pain Descriptors / Indicators: Discomfort, Headache Pain Intervention(s): Limited activity within patient's tolerance, Monitored during session, Repositioned    Home Living                          Prior Function            PT Goals (current goals can now be found in the care plan section) Acute Rehab PT Goals Patient Stated Goal: to find new rehab facility PT Goal Formulation: With patient Time For Goal Achievement: 06/22/22 Potential to Achieve Goals: Fair Progress towards PT goals: Progressing toward goals    Frequency    Min 2X/week      PT Plan Current plan remains appropriate    Co-evaluation              AM-PAC PT "6 Clicks" Mobility   Outcome Measure  Help needed turning from your back to your  side while in a flat bed without using bedrails?: A Lot Help needed moving from lying on your back to sitting on the side of a flat bed without using bedrails?: A Lot Help needed moving to and from a bed to a chair (including a wheelchair)?: A Lot Help needed standing up from a chair using your arms (e.g., wheelchair or bedside chair)?: Total Help needed to walk in hospital room?: Total Help needed climbing 3-5 steps with a railing? : Total 6 Click Score: 9    End of Session Equipment Utilized During Treatment: Gait belt Activity Tolerance: Patient limited by fatigue;Treatment limited secondary to medical complications (Comment) Patient left: with call bell/phone within reach;in bed;with bed alarm set Nurse Communication: Mobility status PT Visit Diagnosis: Other abnormalities of gait and mobility (R26.89);Muscle weakness (generalized) (M62.81)     Time: 5465-6812 PT Time Calculation (min) (ACUTE ONLY): 31 min  Charges:  $Therapeutic Activity: 8-22 mins                     Verner Mould, DPT Acute Rehabilitation Services Office [phone]  06/13/22 2:48 PM

## 2022-06-14 ENCOUNTER — Inpatient Hospital Stay (HOSPITAL_COMMUNITY): Payer: BC Managed Care – PPO

## 2022-06-14 ENCOUNTER — Other Ambulatory Visit: Payer: Self-pay

## 2022-06-14 DIAGNOSIS — J189 Pneumonia, unspecified organism: Secondary | ICD-10-CM | POA: Diagnosis not present

## 2022-06-14 DIAGNOSIS — N189 Chronic kidney disease, unspecified: Secondary | ICD-10-CM | POA: Diagnosis not present

## 2022-06-14 DIAGNOSIS — N179 Acute kidney failure, unspecified: Secondary | ICD-10-CM | POA: Diagnosis not present

## 2022-06-14 DIAGNOSIS — A419 Sepsis, unspecified organism: Secondary | ICD-10-CM | POA: Diagnosis not present

## 2022-06-14 LAB — FOLATE: Folate: 13.7 ng/mL (ref >5.9)

## 2022-06-14 LAB — BASIC METABOLIC PANEL
Anion gap: 9 (ref 5–15)
BUN: 27 mg/dL — ABNORMAL HIGH (ref 8–23)
CO2: 26 mmol/L (ref 22–32)
Calcium: 10.1 mg/dL (ref 8.9–10.3)
Chloride: 104 mmol/L (ref 98–111)
Creatinine, Ser: 0.95 mg/dL (ref 0.44–1.00)
GFR, Estimated: >60 mL/min (ref >60)
Glucose, Bld: 144 mg/dL — ABNORMAL HIGH (ref 70–99)
Potassium: 4.3 mmol/L (ref 3.5–5.1)
Sodium: 139 mmol/L (ref 135–145)

## 2022-06-14 LAB — CBC
HCT: 38 % (ref 36.0–46.0)
Hemoglobin: 12.1 g/dL (ref 12.0–15.0)
MCH: 28.5 pg (ref 26.0–34.0)
MCHC: 31.8 g/dL (ref 30.0–36.0)
MCV: 89.6 fL (ref 80.0–100.0)
Platelets: 243 10*3/uL (ref 150–400)
RBC: 4.24 MIL/uL (ref 3.87–5.11)
RDW: 16.3 % — ABNORMAL HIGH (ref 11.5–15.5)
WBC: 8.6 10*3/uL (ref 4.0–10.5)
nRBC: 0 % (ref 0.0–0.2)

## 2022-06-14 LAB — GLUCOSE, CAPILLARY
Glucose-Capillary: 120 mg/dL — ABNORMAL HIGH (ref 70–99)
Glucose-Capillary: 147 mg/dL — ABNORMAL HIGH (ref 70–99)
Glucose-Capillary: 166 mg/dL — ABNORMAL HIGH (ref 70–99)
Glucose-Capillary: 167 mg/dL — ABNORMAL HIGH (ref 70–99)
Glucose-Capillary: 171 mg/dL — ABNORMAL HIGH (ref 70–99)
Glucose-Capillary: 173 mg/dL — ABNORMAL HIGH (ref 70–99)
Glucose-Capillary: 173 mg/dL — ABNORMAL HIGH (ref 70–99)

## 2022-06-14 LAB — VITAMIN B12: Vitamin B-12: 669 pg/mL (ref 180–914)

## 2022-06-14 MED ORDER — CLORAZEPATE DIPOTASSIUM 7.5 MG PO TABS: 7.5000 mg | ORAL_TABLET | Freq: Two times a day (BID) | ORAL | Status: DC

## 2022-06-14 MED ORDER — GADOBUTROL 1 MMOL/ML IV SOLN
7.5000 mL | Freq: Once | INTRAVENOUS | Status: AC | PRN
  Administered 2022-06-14: 7.5 mL via INTRAVENOUS

## 2022-06-14 MED ORDER — CLORAZEPATE DIPOTASSIUM 3.75 MG PO TABS
7.5000 mg | ORAL_TABLET | Freq: Two times a day (BID) | ORAL | Status: DC
  Administered 2022-06-15 – 2022-06-16 (×3): 7.5 mg
  Filled 2022-06-14 (×5): qty 2
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
## 2022-06-14 NOTE — Progress Notes (Signed)
PROGRESS NOTE    Tara [name]  IRW:[id number] DOB: 1950/09/22 DOA: 06/04/2022 PCP: Biagio Borg, MD   Brief Narrative:   This is a 71 year old female nursing home resident with past medical history of CKD, depression, sarcoid, dyslipidemia, and hypertension.  Today the patient was noted to have decreased level of consciousness, 911 was called.   Patient brought to the ER, patient hypoxic into the 80s on room air.  Chest x-ray shows pneumonia.  She is currently on 4 L oxygen sats in the high 90s.  Patient remains lethargic and poorly responsive.  her work up significant for sepsis due to to aspiration PNA,  as well she was noted to have dysphagia, and by reviewing her records, apparently she was recently started on Ingrezza for Tadive dyskinesia.  Assessment & Plan:   Principal Problem:   Sepsis due to pneumonia [facility]) Active Problems:   Sarcoidosis   Hyperlipidemia   Essential hypertension   Elevated troponin   Obstructive sleep apnea   Lactic acidosis   Acute kidney injury superimposed on chronic kidney disease (HCC)   Acute liver failure   Hypernatremia   Dehydration   Anxiety and depression   Paroxysmal atrial fibrillation with RVR (HCC)  Severe sepsis and acute respiratory failure with hypoxia due to community-acquired pneumonia [facility]): -  Patient meets criteria for severe sepsis based on fever 102, tachycardia, tachypnea, leukocytosis, acute hypoxic respiratory failure and lactic acid of 2.3.  Chest x-ray suggest pneumonia/aspiration pneumonitis.  Completed 5 days of cefepime. Patient currently on room air.  All cultures are negative so far.  Dysphagia -Patient remains with significant dysphagia, on core track tube, she was cleared to start clear liquid diet today.  Tardive  dyskinesia -Patient was recently seen by movement clinic at [facility](03/02/2022, note is on care everywhere) where she was started on valbenazine 40 mg oral daily. -as well she was on Pristiq, which was  held on admission. -I have requested neurology evaluation in this patient with progressive tardive dyskinesia, and worsening dysphagia, who is both on Pristiq and Ingrezza  Acute kidney injury superimposed on chronic kidney disease 3: - Baseline creatinine appears to be around 1.2, presented with 1.86.  Now improved.  Hypokalemia: Resolved.   Acute liver failure ruled out: Patient has very minimally elevated LFTs, does not meet criteria for acute liver failure however elevated LFTs could very well be due to sepsis.  Improved.   Elevated troponin: Patient had elevated but essentially flat troponin with she is not having any chest pain or shortness of breath, likely demand ischemia.  Transthoracic echo ruled out wall motion abnormality, 55% ejection fraction.    Atrial fibrillation with RVR: Has history of paroxysmal atrial fibrillation, was in RVR, likely in the setting of sepsis.  Patient very weak.  Rates controlled.  Continue Cardizem, flecainide and Eliquis.    Hypernatremia: Improved.     Essential hypertension/CAD Blood pressure elevated.  Continue Hydralazine, Cardizem CD, Imdur.   Obstructive sleep apnea -Patient protecting airways however, too ill for CPAP.   Hyperlipidemia -she is not on any medications for his.  Sarcoidosis: Noted.  DVT prophylaxis: SCDs Start: 06/04/22 2221 heparin   Code Status: Full Code  Family Communication:  D/W sister by phone. Status is: Inpatient Remains inpatient appropriate because: Patient unable to swallow.  SLP following.   Estimated body mass index is 26.69 kg/m as calculated from the following:   Height as of this encounter: '5\' 6"'$  (1.676 m).   Weight as of this encounter:  75 kg.    Nutritional Assessment: Body mass index is 26.69 kg/m.Marland Kitchen Seen by dietician.  I agree with the assessment and plan as outlined below: Nutrition Status: Nutrition Problem: Inadequate oral intake Etiology: inability to eat Signs/Symptoms: NPO  status Interventions: Tube feeding  . Skin Assessment: I have examined the patient's skin and I agree with the wound assessment as performed by the wound care RN as outlined below:    Consultants:  Neurology  Procedures:  None  Antimicrobials:  Anti-infectives (From admission, onward)    Start     Dose/Rate Route Frequency Ordered Stop   06/06/22 1730  ceFEPIme (MAXIPIME) 2 g in sodium chloride 0.9 % 100 mL IVPB        2 g 200 mL/hr over 30 Minutes Intravenous Every 12 hours 06/06/22 1627 06/09/22 1649   06/05/22 2200  vancomycin (VANCOREADY) IVPB 750 mg/150 mL  Status:  Discontinued        750 mg 150 mL/hr over 60 Minutes Intravenous Every 24 hours 06/04/22 2328 06/05/22 1019   06/04/22 2330  ceFEPIme (MAXIPIME) 2 g in sodium chloride 0.9 % 100 mL IVPB  Status:  Discontinued        2 g 200 mL/hr over 30 Minutes Intravenous Every 24 hours 06/04/22 2324 06/06/22 1627   06/04/22 2330  vancomycin (VANCOREADY) IVPB 1500 mg/300 mL        1,500 mg 150 mL/hr over 120 Minutes Intravenous  Once 06/04/22 2324 06/05/22 0233   06/04/22 2230  cefTRIAXone (ROCEPHIN) 2 g in sodium chloride 0.9 % 100 mL IVPB  Status:  Discontinued        2 g 200 mL/hr over 30 Minutes Intravenous Every 24 hours 06/04/22 2223 06/04/22 2224   06/04/22 2230  azithromycin (ZITHROMAX) 500 mg in sodium chloride 0.9 % 250 mL IVPB  Status:  Discontinued        500 mg 250 mL/hr over 60 Minutes Intravenous Every 24 hours 06/04/22 2223 06/04/22 2224   06/04/22 1730  cefTRIAXone (ROCEPHIN) 2 g in sodium chloride 0.9 % 100 mL IVPB  Status:  Discontinued        2 g 200 mL/hr over 30 Minutes Intravenous Every 24 hours 06/04/22 1725 06/04/22 2321   06/04/22 1730  azithromycin (ZITHROMAX) 500 mg in sodium chloride 0.9 % 250 mL IVPB  Status:  Discontinued        500 mg 250 mL/hr over 60 Minutes Intravenous Every 24 hours 06/04/22 1725 06/04/22 2321         Subjective:  Patient seen and examined.  Alert and oriented and  no complaints.  Still unable to swallow.  Will need MBS eventually.  Objective: Vitals:   06/14/22 0858 06/14/22 1000 06/14/22 1100 06/14/22 1241  BP: (!) 154/94 (!) 154/89 (!) 158/89 (!) 175/101  Pulse:   86   Resp:      Temp:   97.9 F (36.6 C)   TempSrc:   Axillary   SpO2:   100%   Weight:      Height:        Intake/Output Summary (Last 24 hours) at 06/14/2022 1426 Last data filed at 06/14/2022 0818 Gross per 24 hour  Intake --  Output 1200 ml  Net -1200 ml   Filed Weights   06/10/22 0400 06/12/22 0500 06/14/22 0500  Weight: 75.6 kg 75.9 kg 75 kg    Examination:   Awake Alert, Oriented X 3, sometimes slow to respond to questions, had to repeat multiple times before  she answers. Symmetrical Chest wall movement, Good air movement bilaterally, CTAB RRR,No Gallops,Rubs or new Murmurs, No Parasternal Heave +ve B.Sounds, Abd Soft, No tenderness, No rebound - guarding or rigidity. No Cyanosis, Clubbing or edema, No new Rash or bruise     Data Reviewed: I have personally reviewed following labs and imaging studies  CBC: Recent Labs  Lab 06/10/22 0256 06/11/22 0303 06/12/22 0555 06/13/22 0810 06/14/22 0443  WBC 7.9 9.2 10.1 9.7 8.6  HGB 12.3 12.8 12.8 12.6 12.1  HCT 39.3 38.1 39.9 39.3 38.0  MCV 88.7 85.8 87.9 88.3 89.6  PLT 208 234 284 267 741   Basic Metabolic Panel: Recent Labs  Lab 06/07/22 1631 06/08/22 0928 06/09/22 0303 06/10/22 0256 06/14/22 0443  NA  --  144 143 142 139  K 3.2* 3.3* 4.2 3.9 4.3  CL  --  110 107 108 104  CO2  --  '26 26 28 26  '$ GLUCOSE  --  172* 160* 143* 144*  BUN  --  '16 20 20 '$ 27*  CREATININE  --  0.83 0.85 0.81 0.95  CALCIUM  --  8.8* 9.1 9.0 10.1  MG 1.8 1.9  --   --   --   PHOS 3.0  --   --   --   --    GFR: Estimated Creatinine Clearance: 57.9 mL/min (by C-G formula based on SCr of 0.95 mg/dL). Liver Function Tests: No results for input(s): "AST", "ALT", "ALKPHOS", "BILITOT", "PROT", "ALBUMIN" in the last 168  hours.  No results for input(s): "LIPASE", "AMYLASE" in the last 168 hours. No results for input(s): "AMMONIA" in the last 168 hours.  Coagulation Profile: No results for input(s): "INR", "PROTIME" in the last 168 hours.  Cardiac Enzymes: No results for input(s): "CKTOTAL", "CKMB", "CKMBINDEX", "TROPONINI" in the last 168 hours. BNP (last 3 results) No results for input(s): "PROBNP" in the last 8760 hours. HbA1C: Recent Labs    06/12/22 0555  HGBA1C 5.7*   CBG: Recent Labs  Lab 06/13/22 2018 06/14/22 0020 06/14/22 0347 06/14/22 0821 06/14/22 1139  GLUCAP 186* 167* 120* 173* 171*   Lipid Profile: No results for input(s): "CHOL", "HDL", "LDLCALC", "TRIG", "CHOLHDL", "LDLDIRECT" in the last 72 hours. Thyroid Function Tests: No results for input(s): "TSH", "T4TOTAL", "FREET4", "T3FREE", "THYROIDAB" in the last 72 hours.  Anemia Panel: No results for input(s): "VITAMINB12", "FOLATE", "FERRITIN", "TIBC", "IRON", "RETICCTPCT" in the last 72 hours. Sepsis Labs: No results for input(s): "PROCALCITON", "LATICACIDVEN" in the last 168 hours.   Recent Results (from the past 240 hour(s))  Blood Culture (routine x 2)     Status: None   Collection Time: 06/04/22  5:03 PM   Specimen: BLOOD  Result Value Ref Range Status   Specimen Description BLOOD SITE NOT SPECIFIED  Final   Special Requests   Final    BOTTLES DRAWN AEROBIC AND ANAEROBIC Blood Culture adequate volume   Culture   Final    NO GROWTH 5 DAYS Performed at [facility], [address], [place], [place] [address]    Report Status 06/09/2022 FINAL  Final  Resp Panel by RT-PCR (Flu A&B, Covid) Anterior Nasal Swab     Status: None   Collection Time: 06/04/22  5:16 PM   Specimen: Anterior Nasal Swab  Result Value Ref Range Status   SARS Coronavirus 2 by RT PCR NEGATIVE NEGATIVE Final    Comment: (NOTE) SARS-CoV-2 target nucleic acids are NOT DETECTED.  The SARS-CoV-2 RNA is generally detectable in upper  respiratory specimens during the acute phase of infection. The lowest concentration of SARS-CoV-2 viral copies this assay can detect is 138 copies/mL. A negative result does not preclude SARS-Cov-2 infection and should not be used as the sole basis for treatment or other patient management decisions. A negative result may occur with  improper specimen collection/handling, submission of specimen other than nasopharyngeal swab, presence of viral mutation(s) within the areas targeted by this assay, and inadequate number of viral copies(<138 copies/mL). A negative result must be combined with clinical observations, patient history, and epidemiological information. The expected result is Negative.  Fact Sheet for Patients:  EntrepreneurPulse.com.au  Fact Sheet for Healthcare Providers:  IncredibleEmployment.be  This test is no t yet approved or cleared by the [place] FDA and  has been authorized for detection and/or diagnosis of SARS-CoV-2 by FDA under an Emergency Use Authorization (EUA). This EUA will remain  in effect (meaning this test can be used) for the duration of the COVID-19 declaration under Section 564(b)(1) of the Act, 21 U.S.C.section 360bbb-3(b)(1), unless the authorization is terminated  or revoked sooner.       Influenza A by PCR NEGATIVE NEGATIVE Final   Influenza B by PCR NEGATIVE NEGATIVE Final    Comment: (NOTE) The Xpert Xpress SARS-CoV-2/FLU/RSV plus assay is intended as an aid in the diagnosis of influenza from Nasopharyngeal swab specimens and should not be used as a sole basis for treatment. Nasal washings and aspirates are unacceptable for Xpert Xpress SARS-CoV-2/FLU/RSV testing.  Fact Sheet for Patients: EntrepreneurPulse.com.au  Fact Sheet for Healthcare Providers: IncredibleEmployment.be  This test is not yet approved or cleared by the [place] FDA and has been  authorized for detection and/or diagnosis of SARS-CoV-2 by FDA under an Emergency Use Authorization (EUA). This EUA will remain in effect (meaning this test can be used) for the duration of the COVID-19 declaration under Section 564(b)(1) of the Act, 21 U.S.C. section 360bbb-3(b)(1), unless the authorization is terminated or revoked.  Performed at [facility], [place] [address] [place], [place] [address]   Urine Culture     Status: Abnormal   Collection Time: 06/04/22 10:00 PM   Specimen: Urine, Clean Catch  Result Value Ref Range Status   Specimen Description URINE, CLEAN CATCH  Final   Special Requests   Final    NONE Performed at [facility], [place] [address], [place], [place] [address]    Culture MULTIPLE SPECIES PRESENT, SUGGEST RECOLLECTION (A)  Final   Report Status 06/06/2022 FINAL  Final  MRSA Next Gen by PCR, Nasal     Status: None   Collection Time: 06/05/22 12:30 AM   Specimen: Nasal Mucosa; Nasal Swab  Result Value Ref Range Status   MRSA by PCR Next Gen NOT DETECTED NOT DETECTED Final    Comment: (NOTE) The GeneXpert MRSA Assay (FDA approved for NASAL specimens only), is one component of a comprehensive MRSA colonization surveillance program. It is not intended to diagnose MRSA infection nor to guide or monitor treatment for MRSA infections. Test performance is not FDA approved in patients less than 49 years old. Performed at [facility], [place] [address], [place], [place] [address]      Radiology Studies: MR BRAIN WO CONTRAST  Result Date: 06/13/2022 CLINICAL DATA:  Rule out stroke. EXAM: MRI HEAD WITHOUT CONTRAST TECHNIQUE: Multiplanar, multiecho pulse sequences of the brain and surrounding structures were obtained without intravenous contrast. COMPARISON:  CT Brain 06/04/22 FINDINGS: Brain: No acute infarction, hemorrhage,  hydrocephalus, extra-axial collection or mass lesion.Sequela of mild-to-moderate chronic microvascular ischemic change.  T2/FLAIR hyperintense signal is also seen within the central pons, favored to represent sequela of chronic microvascular ischemic change. Vascular: Normal flow voids. Skull and upper cervical spine: Normal marrow signal. Sinuses/Orbits: Bilateral lens replacement. Mild mucosal thickening bilateral anterior ethmoid air cells. Other: None. IMPRESSION: No acute intracranial abnormality. Electronically Signed   By: Marin Roberts M.D.   On: 06/13/2022 16:34    Scheduled Meds:  apixaban  5 mg Per Tube BID   brimonidine  1 drop Both Eyes BID   And   timolol  1 drop Both Eyes BID   cholecalciferol  1,000 Units Per Tube Daily   diltiazem  60 mg Per Tube Q6H   feeding supplement (PROSource TF20)  60 mL Per Tube Daily   ferrous sulfate  300 mg Per Tube Q breakfast   flecainide  50 mg Per Tube BID   hydrALAZINE  100 mg Per Tube TID   insulin aspart  0-9 Units Subcutaneous Q4H   isosorbide dinitrate  10 mg Per Tube TID   latanoprost  1 drop Both Eyes QHS   multivitamin with minerals  1 tablet Per Tube Daily   mouth rinse  15 mL Mouth Rinse 4 times per day   valbenazine  40 mg Oral Daily   Continuous Infusions:  feeding supplement (JEVITY 1.2 CAL) 1,000 mL (06/12/22 1556)     LOS: 10 days   Phillips Climes, MD [facility]  06/14/2022, 2:26 PM   *Please note that this is a verbal dictation therefore any spelling or grammatical errors are due to the "[place] [employer]" system interpretation.  Please page via [place] and do not message via secure chat for urgent patient care matters. Secure chat can be used for non urgent patient care matters.  How to contact the [facility] Attending or Consulting provider [place] or covering provider during after hours [place], for this patient?  Check the care team in [facility] and look for a) attending/consulting TR[facility] provider listed and b) the [place] Day team listed. Page or secure chat [phone]. Log into [email or web address] and use [place]'s universal password to access. If you  do not have the password, please contact the hospital operator. Locate the [facility] provider you are looking for under [facility] and page to a number that you can be directly reached. If you still have difficulty reaching the provider, please page the [facility] (Director on Call) for the Hospitalists listed on amion for assistance.

## 2022-06-14 NOTE — Consult Note (Signed)
Neurology Consultation  Reason for Consult: involuntary facial movements and rigidity Referring Physician: Dr. Oda Cogan  CC: Rigidity and involuntary facial movements  History is obtained from: Patient and chart  HPI: Tara [name] is a 71 y.o. female with history of sarcoidosis (biopsy-proven 2019 lymph node biopsy), CKD, depression, hyperlipidemia and hypertension who originally presented from nursing facility with hypoxia and sepsis due to aspiration pneumonia.  During her hospitalization, she has had trouble maintaining p.o. intake due to rigidity as well as involuntary facial movements.  She has a history of tardive dyskinesia, and was seen by a movement disorder specialist in August and was prescribed Ingrezza.  She has had worsening since starting the Ingrezza.  She states that she has a history of sarcoidosis, but does not take anything for this on a daily basis although she has been on a few courses of steroids.  She is noted to have constant, involuntary eye blinking movements, twitching of the nose, as well as twitching of the upper lip.  Regarding her history of tardive dyskinesia , She notes that she did take Abilify for approximately 1 year in 2005, which was a low-dose and was stopped.  She did not notice her tardive dyskinesia movements herself but notes her pulmonologist who follows her for sarcoidosis (Dr. Baird Lyons) noticed she was having these movements since at least approximately 2019.  Initially these were felt to be a facial tic.  Approximately 1 year ago her transient (clorazepate), which she had been on chronically for many years 7.5 mg 3-4 times daily was reduced to 7.5 mg twice daily due to her age.  She also notes that she was given Compazine in September for migraine headache during an ED visit.  She was seen by movement disorders clinic at [facility] (Dr. Jetta Lout).  The history she provided them matches fairly well to history she is provided me  above, though they also note that her mother had some sort of movement disorder as well and that on chart review as far back as 2014 there was notes of blinking and grimacing.  She was started on valbenazine 40 mg daily with plan to increase to 80 mg daily if well-tolerated with attention to QTc.  There was also plan for MRI brain with and without contrast due to sarcoidosis and incoordination (not clear to me that this was completed)  Patient reports that she was brought to the hospital due to her mouth movements but on review of chart H&P mentions she was brought to the hospital due to pneumonia and hypoxia to the 80s on room air She was treated with cefepime from 11/20 through 11/25, as well as receiving 1 dose of azithromycin on 11/20 and 1 dose of vancomycin on 11/20 Valbenazine was not restarted until 11/28  She has not been getting her Tranxene this admission.  Xanax was started on 11/28 at 0.25 mg twice daily as needed.  She got 1 dose on 11/28 and has taken 1 dose 11/30 in the evening  04/25/2022 note by cardiologist Dr. Phineas Inches notes that she was having failure to thrive, and a significant decline since Dr. Harl Bowie first saw her (11/23/2021) and concern for thrush.  She had a fall and a UTI in September after which she was sent to skilled nursing facility, where he she has been since (admitted 04/01/2022, discharged 04/09/2022)  On discharge 04/09/2022 her weight was 89 kg and her weight here is documented as 75 kg  Additionally on review of systems  she notes she has been having some double vision for the past few days  ROS: A complete ROS was performed and is negative except as noted in the HPI.   Past Medical History:  Diagnosis Date   Anemia    on meds   ANKLE PAIN, LEFT 04/01/2008   ANXIETY 04/17/2007   on meds   Arthritis    generalized   Cataract    bilateral sx   Chronic kidney disease    stage 3   Colon polyp    COLONIC POLYPS, HX OF 08/01/2007   CONTUSIONS, MULTIPLE  04/01/2009   DEPRESSION 04/17/2007   on meds   DIZZINESS 08/01/2007   DYSPNEA 08/01/2007   with exertion   Enlargement of lymph nodes 08/13/2007   Excessive involuntary blinking    per pt,going on since 2018   Glaucoma    on meds   GLUCOSE INTOLERANCE 08/01/2007   Hypercalcemia due to sarcoidosis 2014   HYPERLIPIDEMIA 08/01/2007   no meds   HYPERSOMNIA 07/28/2008   HYPERTENSION 04/17/2007   Impaired glucose tolerance 03/23/2011   JOINT EFFUSION, LEFT KNEE 06/02/2010   Loose body in knee 04/01/2009   pt not sure?   Metabolic encephalopathy [id number]/[id number]   Migraines    "stopped 3-4 yr ago" (07/23/2012)   Morbid obesity ([place]) 04/20/2007   OTHER DISEASES OF LUNG NOT ELSEWHERE CLASSIFIED 08/01/2007   Pain in joint, lower leg 04/01/2009   PERIPHERAL EDEMA 04/21/2009   Pre-diabetes    Sarcoidosis 09/25/2007   Seasonal allergies    SHOULDER PAIN, LEFT 04/01/2009   Sleep apnea    does not use Cpap   Past Surgical History:  Procedure Laterality Date   BREAST SURGERY     Biopsy benign/bil breaST   CARDIOVERSION N/A 12/15/2021   Procedure: CARDIOVERSION;  Surgeon: Skeet Latch, MD;  Location: [place];  Service: Cardiovascular;  Laterality: N/A;   CARDIOVERSION N/A 02/09/2022   Procedure: CARDIOVERSION;  Surgeon: [name], MD;  Location: [place];  Service: Cardiovascular;  Laterality: N/A;   CATARACT EXTRACTION     RIGHT EYE   COLONOSCOPY  2020   KN-MAC-suprep(good)-tics/TA's   COMBINED MEDIASTINOSCOPY AND BRONCHOSCOPY  08/2007   COMBINED MEDIASTINOSCOPY AND BRONCHOSCOPY  2009   Dental implant     FRACTURE SURGERY  ?02/1997   "left upper arm; put rod in" (07/23/2012)   GUM SURGERY  2000-?2009   "several ORs; soft tissue graft; took material from roof of mouth" (07/23/2012   HUMERUS SURGERY Left 1998   rod insertion   KNEE ARTHROSCOPY  03/2004; 06/2009   "right; left" Dr. Theda Sers   KNEE SURGERY  2012   ARTHROSCOPIC LEFT KNEE   LYMPH NODE BIOPSY  ~ 2009    "for sarcoidosis; don't know exactly which nodes" (07/23/2102)   [place]   Open   POLYPECTOMY  2020   TA's   REFRACTIVE SURGERY  08/1998   "both eyes" (07/23/2012)   [place]   Bil   TOTAL KNEE ARTHROPLASTY Left 09/24/2016   Procedure: TOTAL KNEE ARTHROPLASTY;  Surgeon: Dorna Leitz, MD;  Location: [place];  Service: Orthopedics;  Laterality: Left;   Current Outpatient Medications  Medication Instructions   amoxicillin (AMOXIL) 2,000 mg, Oral,  Once, Prior to dental appointments   antiseptic oral rinse (BIOTENE) LIQD 15 mLs, Mouth Rinse, 3 times daily PRN   apixaban (ELIQUIS) 5 MG TABS tablet TAKE 1 TABLET(5 MG) BY MOUTH TWICE DAILY   baclofen (LIORESAL) 5 mg, Oral, 2  times daily PRN   brimonidine-timolol (COMBIGAN) 0.2-0.5 % ophthalmic solution 1 drop, Both Eyes, Every 12 hours   carboxymethylcellulose (REFRESH PLUS) 0.5 % SOLN 1 drop, Both Eyes, 3 times daily PRN, RETAIN eye drops instead of refresh   cholecalciferol (VITAMIN D3) 1,000 Units, Oral, Daily   clorazepate (TRANXENE) 7.5 mg, Oral, 2 times daily   desvenlafaxine (PRISTIQ) 100 mg, Oral, Daily at bedtime   diltiazem (CARDIZEM CD) 240 mg, Oral, Daily   ferrous sulfate 325 mg, Oral, Daily with breakfast   flecainide (TAMBOCOR) 50 mg, Oral, 2 times daily   hydrALAZINE (APRESOLINE) 100 mg, Oral, 3 times daily   Ingrezza 40 mg, Oral, Daily   isosorbide mononitrate (IMDUR) 30 mg, Oral, Daily   latanoprost (XALATAN) 0.005 % ophthalmic solution 1 drop, Both Eyes, Daily at bedtime   melatonin 10 mg, Oral, Daily at bedtime   Multiple Vitamins-Minerals (MULTIVITAMIN WITH MINERALS) tablet 1 tablet, Oral, Daily   Omega 3 2,400 mg, Oral, Daily at bedtime   senna (SENOKOT) 8.6 MG tablet 1 tablet, Oral, Daily     Family History  Problem Relation Age of Onset   Diabetes Mother    Hypertension Mother    Stroke Mother    Heart disease Father 93   Hypertension Father    Hypertension Sister    Asthma Sister     Gout Sister    Colon cancer Neg Hx    Breast cancer Neg Hx    Esophageal cancer Neg Hx    Stomach cancer Neg Hx    Rectal cancer Neg Hx    Colon polyps Neg Hx     Social History:   reports that she has never smoked. She has never used smokeless tobacco. She reports current alcohol use of about 1.0 - 2.0 standard drink of alcohol per week. She reports that she does not use drugs.  Medications  Current Facility-Administered Medications:    acetaminophen (TYLENOL) tablet 650 mg, 650 mg, Oral, Q6H PRN, 650 mg at 06/14/22 0858 **OR** acetaminophen (TYLENOL) suppository 650 mg, 650 mg, Rectal, Q6H PRN, Claria Dice, Debby, MD   ALPRAZolam Duanne Moron) tablet 0.25 mg, 0.25 mg, Per Tube, BID PRN, Darliss Cheney, MD, 0.25 mg at 06/14/22 0040   antiseptic oral rinse (BIOTENE) solution 15 mL, 15 mL, Mouth Rinse, TID PRN, Darliss Cheney, MD   apixaban (ELIQUIS) tablet 5 mg, 5 mg, Per Tube, BID, Pahwani, Ravi, MD, 5 mg at 06/14/22 0858   brimonidine (ALPHAGAN) 0.2 % ophthalmic solution 1 drop, 1 drop, Both Eyes, BID, 1 drop at 06/14/22 0921 **AND** timolol (TIMOPTIC) 0.5 % ophthalmic solution 1 drop, 1 drop, Both Eyes, BID, Pahwani, Ravi, MD, 1 drop at 06/14/22 6283   cholecalciferol (VITAMIN D3) 25 MCG (1000 UNIT) tablet 1,000 Units, 1,000 Units, Per Tube, Daily, Pahwani, Ravi, MD, 1,000 Units at 06/14/22 0903   diltiazem (CARDIZEM) tablet 60 mg, 60 mg, Per Tube, Q6H, Pahwani, Ravi, MD, 60 mg at 06/14/22 1241   feeding supplement (JEVITY 1.2 CAL) liquid 1,000 mL, 1,000 mL, Per Tube, Continuous, Pahwani, Ravi, MD, Last Rate: 60 mL/hr at 06/12/22 1556, 1,000 mL at 06/12/22 1556   feeding supplement (PROSource TF20) liquid 60 mL, 60 mL, Per Tube, Daily, Pahwani, Ravi, MD, 60 mL at 06/14/22 0902   ferrous sulfate 300 (60 Fe) MG/5ML syrup 300 mg, 300 mg, Per Tube, Q breakfast, Pahwani, Ravi, MD, 300 mg at 06/14/22 0900   flecainide (TAMBOCOR) tablet 50 mg, 50 mg, Per Tube, BID, Darliss Cheney, MD, 50 mg at 06/14/22  0900   hydrALAZINE (APRESOLINE) tablet 100 mg, 100 mg, Per Tube, TID, Darliss Cheney, MD, 100 mg at 06/14/22 0858   insulin aspart (novoLOG) injection 0-9 Units, 0-9 Units, Subcutaneous, Q4H, Darliss Cheney, MD, 2 Units at 06/14/22 1237   isosorbide dinitrate (ISORDIL) tablet 10 mg, 10 mg, Per Tube, TID, Darliss Cheney, MD, 10 mg at 06/14/22 0859   latanoprost (XALATAN) 0.005 % ophthalmic solution 1 drop, 1 drop, Both Eyes, QHS, Pahwani, Ravi, MD, 1 drop at 06/12/22 2114   melatonin tablet 5 mg, 5 mg, Per Tube, QHS PRN, Irene Pap N, DO, 5 mg at 06/14/22 0040   metoprolol tartrate (LOPRESSOR) injection 5 mg, 5 mg, Intravenous, Q6H PRN, Claria Dice, Debby, MD   multivitamin with minerals tablet 1 tablet, 1 tablet, Per Tube, Daily, Darliss Cheney, MD, 1 tablet at 06/14/22 0900   Oral care mouth rinse, 15 mL, Mouth Rinse, 4 times per day, Darliss Cheney, MD, 15 mL at 06/14/22 1541   Oral care mouth rinse, 15 mL, Mouth Rinse, PRN, Darliss Cheney, MD   polyvinyl alcohol (LIQUIFILM TEARS) 1.4 % ophthalmic solution 1 drop, 1 drop, Both Eyes, PRN, Hall, Carole N, DO, 1 drop at 06/11/22 1614   polyvinyl alcohol (LIQUIFILM TEARS) 1.4 % ophthalmic solution 1 drop, 1 drop, Both Eyes, TID PRN, Darliss Cheney, MD, 1 drop at 06/14/22 0044   valbenazine (INGREZZA) capsule 40 mg, 40 mg, Oral, Daily, Pahwani, Ravi, MD, 40 mg at 06/14/22 0900   Exam: Current vital signs: BP (!) 138/102 (BP Location: Left Arm)   Pulse 70   Temp 97.8 F (36.6 C) (Oral)   Resp 19   Ht _0  (1.676 m)   Wt 75 kg   SpO2 100%   BMI 26.69 kg/m  Vital signs in last 24 hours: Temp:  [97.4 F (36.3 C)-98.7 F (37.1 C)] 97.8 F (36.6 C) (11/30 1500) Pulse Rate:  [62-86] 70 (11/30 1500) Resp:  [19] 19 (11/30 0352) BP: (102-175)/(55-102) 138/102 (11/30 1500) SpO2:  [97 %-100 %] 100 % (11/30 1500) Weight:  [75 kg] 75 kg (11/30 0500)  Physical Exam  Constitutional: Appears comfortable despite her very frequent facial grimacing, blinking  and tardive tongue movements Psych: Affect appropriate to situation, calm and cooperative, Eyes: No scleral injection HENT: No OP obstruction, there is some white patchiness on her tongue MSK: no joint deformities.  Cardiovascular: Perfusing extremities well Respiratory: Effort normal, non-labored breathing GI: Soft.  No distension. There is no tenderness.  Skin: WDI  Neuro: Mental Status: Patient is awake, alert, oriented to person, place, month, year, and situation. Patient is able to give a clear and coherent history. No signs of aphasia or neglect Cranial Nerves: II: Visual Fields are full. Pupils are equal, round, and reactive to light.   III,IV, VI: Somewhat limited by very frequent eye blinking, however intermittently her right eye is noted to deviate outwardly, and at these times she reports double vision V: Facial sensation is symmetric to temperature VII: Facial movement is symmetric.  VIII: hearing is intact to voice X: Uvula elevates symmetrically XI: Shoulder shrug is symmetric. XII: tongue is midline without atrophy or fasciculations.  Motor: Uses all 4 extremities equally.  She has no asterixis.  Perhaps some slight motor impersistence.  She is at least antigravity with all 4 extremities, formal confrontational strength testing not completed.  She does have some cogwheeling tone especially in the upper extremities Tardive dyskinetic movements involve primarily her face with minimal effect on her other extremities Sensory: Sensation  is intact to light touch throughout Cerebellar: FNF and HKS are intact bilaterally.  However on rapid finger tapping she is very arrhythmic and clumsy  Labs I have reviewed labs in epic and the results pertinent to this consultation are:   Basic Metabolic Panel: Recent Labs  Lab 06/08/22 0928 06/09/22 0303 06/10/22 0256 06/14/22 0443  NA 144 143 142 139  K 3.3* 4.2 3.9 4.3  CL 110 107 108 104  CO2 _0 GLUCOSE 172* 160*  143* 144*  BUN _1 27*  CREATININE 0.83 0.85 0.81 0.95  CALCIUM 8.8* 9.1 9.0 10.1  MG 1.9  --   --   --     CBC: Recent Labs  Lab 06/10/22 0256 06/11/22 0303 06/12/22 0555 06/13/22 0810 06/14/22 0443  WBC 7.9 9.2 10.1 9.7 8.6  HGB 12.3 12.8 12.8 12.6 12.1  HCT 39.3 38.1 39.9 39.3 38.0  MCV 88.7 85.8 87.9 88.3 89.6  PLT 208 234 284 267 243    Coagulation Studies: No results for input(s): "LABPROT", "INR" in the last 72 hours.     Lipid Panel     Component Value Date/Time   CHOL 110 10/10/2021 1013   TRIG 56.0 10/10/2021 1013   HDL 56.20 10/10/2021 1013   CHOLHDL 2 10/10/2021 1013   VLDL 11.2 10/10/2021 1013   LDLCALC 43 10/10/2021 1013   LDLDIRECT 50.0 09/24/2017 1614     Imaging I have reviewed the images obtained: MRI examination of the brain personally reviewed, agree with radiology that there is no acute intracranial abnormality though there are some white matter changes, nonspecific, but particularly notable in the pons  Assessment: This is a patient with biopsy-proven sarcoidosis, recent diagnosis of atrial fibrillation, highly functional at baseline but nursing home resident since September.  Neurology is consulted regarding her tardive dyskinesia movements which have been inhibiting her ability to eat well.  I suspect that she is having worsening of her tardive dyskinesia in the setting of discontinuation of her benzodiazepines, as these are known to be an effective treatment for tardive dyskinesia symptoms.    I am concerned about her eye movement abnormalities with new double vision noted by the patient in the last 2 or 3 days.  Overall she does seem to provide a clear and consistent history although some details are surprisingly inconsistent (for example reporting to me that she came to the hospital due to trouble with her eating, when she was actually admitted for pneumonia)  I am unsure of the etiology of her general decline, though with her history of  sarcoidosis, further investigation is warranted, I am unsure that her marked weight loss is secondary only to tardive dyskinesia of her face and tongue, although certainly this may be contributing to difficulty eating.  From a neurological perspective I will obtain MRI with contrast of the brain as well as MRI with and without contrast of the cervical spine.   It is possible she is having some side effects of Ingrezza contributing to her falls, but I will make 1 change in her medications at a time  Impression: -Decompensated tardive dyskinesia -Biopsy-proven sarcoidosis -Dramatic functional decline -Chronic headache  Recommendations: -Continue Ingrezza for now, consider discontinuing in the future -Resume Tranxene 7.5 mg twice daily, if she tolerates this well would consider increasing further, though with caution as I am unsure she is accurately reporting that she had no symptoms leading to its decrease -MRI brain with contrast, MRI cervical spine with and  without contrast -Nutritional labs for reversible causes of myelopathy/neuropathy: B12, MMA, thiamine, folate, niacin, copper, zinc, vitamin E -CT chest/abdomen/pelvis with contrast to delineate how active her sarcoidosis is as well as screening for malignancy given her very marked weight loss -Neurology will continue to follow  Pt seen by NP/Neuro and later by MD. Note/plan to be edited by MD as needed.  [place] , MSN, AGACNP-BC [facility] See Amion for schedule and pager information 06/14/2022 4:23 PM  Attending Neurologist's note:  I personally saw this patient, gathering history, performing a full neurologic examination, reviewing relevant labs, personally reviewing relevant imaging including MRI brain, and formulated the assessment and plan, adding the note above for completeness and clarity to accurately reflect my thoughts  Available 7 AM to 7 PM, outside these hours please contact Neurologist on call  listed on AMION

## 2022-06-15 ENCOUNTER — Inpatient Hospital Stay (HOSPITAL_COMMUNITY): Payer: BC Managed Care – PPO

## 2022-06-15 DIAGNOSIS — G2401 Drug induced subacute dyskinesia: Secondary | ICD-10-CM

## 2022-06-15 DIAGNOSIS — N179 Acute kidney failure, unspecified: Secondary | ICD-10-CM | POA: Diagnosis not present

## 2022-06-15 DIAGNOSIS — J189 Pneumonia, unspecified organism: Secondary | ICD-10-CM | POA: Diagnosis not present

## 2022-06-15 DIAGNOSIS — A419 Sepsis, unspecified organism: Secondary | ICD-10-CM | POA: Diagnosis not present

## 2022-06-15 LAB — GLUCOSE, CAPILLARY
Glucose-Capillary: 107 mg/dL — ABNORMAL HIGH (ref 70–99)
Glucose-Capillary: 137 mg/dL — ABNORMAL HIGH (ref 70–99)
Glucose-Capillary: 148 mg/dL — ABNORMAL HIGH (ref 70–99)
Glucose-Capillary: 164 mg/dL — ABNORMAL HIGH (ref 70–99)
Glucose-Capillary: 164 mg/dL — ABNORMAL HIGH (ref 70–99)
Glucose-Capillary: 193 mg/dL — ABNORMAL HIGH (ref 70–99)

## 2022-06-15 LAB — CBC
HCT: 36.7 % (ref 36.0–46.0)
Hemoglobin: 12.1 g/dL (ref 12.0–15.0)
MCH: 28.9 pg (ref 26.0–34.0)
MCHC: 33 g/dL (ref 30.0–36.0)
MCV: 87.6 fL (ref 80.0–100.0)
Platelets: 242 10*3/uL (ref 150–400)
RBC: 4.19 MIL/uL (ref 3.87–5.11)
RDW: 16.1 % — ABNORMAL HIGH (ref 11.5–15.5)
WBC: 10.1 10*3/uL (ref 4.0–10.5)
nRBC: 0 % (ref 0.0–0.2)

## 2022-06-15 LAB — BASIC METABOLIC PANEL
Anion gap: 8 (ref 5–15)
BUN: 31 mg/dL — ABNORMAL HIGH (ref 8–23)
CO2: 27 mmol/L (ref 22–32)
Calcium: 10.4 mg/dL — ABNORMAL HIGH (ref 8.9–10.3)
Chloride: 99 mmol/L (ref 98–111)
Creatinine, Ser: 1 mg/dL (ref 0.44–1.00)
GFR, Estimated: >60 mL/min (ref >60)
Glucose, Bld: 147 mg/dL — ABNORMAL HIGH (ref 70–99)
Potassium: 4.4 mmol/L (ref 3.5–5.1)
Sodium: 134 mmol/L — ABNORMAL LOW (ref 135–145)

## 2022-06-15 LAB — CK: Total CK: 21 U/L — ABNORMAL LOW (ref 38–234)

## 2022-06-15 MED ORDER — IOHEXOL 350 MG/ML SOLN
70.0000 mL | Freq: Once | INTRAVENOUS | Status: AC | PRN
  Administered 2022-06-15: 70 mL via INTRAVENOUS

## 2022-06-15 NOTE — Progress Notes (Deleted)
Neurology results update  MRI brain without and with contrast unremarkable (personal review)  MRI c spine wwo   1. No acute abnormality of the cervical spine. 2. Mild cervical degenerative disc disease without spinal canal or neural foraminal stenosis.  CT c/a/p  1. No convincing evidence of primary malignancy or metastatic disease within the chest, abdomen or pelvis. 2. New 4 mm left lower lobe ground-glass pulmonary nodule, favored infectious or inflammatory. No follow-up recommended. This recommendation follows the consensus statement: Guidelines for Management of Incidental Pulmonary Nodules Detected on CT Images: From the Fleischner Society 2017; Radiology 2017; 284:228-243. 3. Gallbladder is distended with some mild wall thickening. If there is clinical concern for acute cholecystitis consider further evaluation with nuclear medicine HIDA scan. 4. Moderate volume of formed stool in the colon with a large rectal stool ball and rectal wall thickening and stranding in the perirectal fat, findings which can be seen in the setting of stercoral colitis. 5. Colonic diverticulosis without findings of acute diverticulitis. 6. Gas in the vagina suggest correlation with recent instrumentation. 7.  Aortic Atherosclerosis (ICD10-I70.0).  Neurology will re-examine patient in f/u tomorrow.  Su Monks, MD [facility] [phone]  If 7pm- 7am, please page neurology on call as listed in [place].

## 2022-06-15 NOTE — Progress Notes (Signed)
PROGRESS NOTE    Tara [name]  XIH:[id number] DOB: 1950/08/14 DOA: 06/04/2022 PCP: Biagio Borg, MD   Brief Narrative:   This is a 71 year old female nursing home resident with past medical history of CKD, depression, sarcoid, dyslipidemia, and hypertension.  Today the patient was noted to have decreased level of consciousness, 911 was called.   Patient brought to the ER, patient hypoxic into the 80s on room air.  Chest x-ray shows pneumonia.  She is currently on 4 L oxygen sats in the high 90s.  Patient remains lethargic and poorly responsive.  her work up significant for sepsis due to to aspiration PNA,  as well she was noted to have dysphagia, and by reviewing her records, apparently she was recently started on Ingrezza for Tadive dyskinesia.  Assessment & Plan:   Principal Problem:   Sepsis due to pneumonia [facility]) Active Problems:   Sarcoidosis   Hyperlipidemia   Essential hypertension   Elevated troponin   Obstructive sleep apnea   Lactic acidosis   Acute kidney injury superimposed on chronic kidney disease (HCC)   Acute liver failure   Hypernatremia   Dehydration   Anxiety and depression   Paroxysmal atrial fibrillation with RVR (HCC)  Severe sepsis and acute respiratory failure with hypoxia due to community-acquired pneumonia [facility]): -  Patient meets criteria for severe sepsis based on fever 102, tachycardia, tachypnea, leukocytosis, acute hypoxic respiratory failure and lactic acid of 2.3.  Chest x-ray suggest pneumonia/aspiration pneumonitis.  Completed 5 days of cefepime. Patient currently on room air.  All cultures are negative so far.  Dysphagia -Patient remains with significant dysphagia, on core track tube, been followed closely by SLP, was started on clear liquid diet.    Tardive  dyskinesia -Patient was recently seen by movement clinic at [facility] (03/02/2022, note is on care everywhere) where she was started on valbenazine 40 mg oral daily. -as well she was on  Pristiq, which was held on admission. -Further management per neurology, resumed on her home dose clorazepate. -Follow on B1, vitamin EE, zinc, MMA level. -CTA chest/abdomen/pelvis is pending further evaluation to the extent of her sarcoidosis disease -No acute finding in  MRI brain with/without contrast.  Acute kidney injury superimposed on chronic kidney disease 3: - Baseline creatinine appears to be around 1.2, presented with 1.86.  Now improved.  Hypokalemia:  - Resolved.   Transaminitis  -Likely related to sepsis.   Elevated troponin: Patient had elevated but essentially flat troponin with she is not having any chest pain or shortness of breath, likely demand ischemia.  Transthoracic echo ruled out wall motion abnormality, 55% ejection fraction.    Atrial fibrillation with RVR: Has history of paroxysmal atrial fibrillation, was in RVR, likely in the setting of sepsis.  Patient very weak.  Rates controlled.  Continue Cardizem, flecainide and Eliquis.    Hypernatremia: Improved.     Essential hypertension/CAD Blood pressure elevated.  Continue Hydralazine, Cardizem CD, Imdur.   Obstructive sleep apnea -Patient protecting airways however, too ill for CPAP.   Hyperlipidemia -she is not on any medications for his.  Sarcoidosis: Please see above discussion  DVT prophylaxis: SCDs Start: 06/04/22 2221 heparin   Code Status: Full Code  Family Communication:  D/W sister by phone. Status is: Inpatient Remains inpatient appropriate because: Patient unable to swallow.  SLP following.   Estimated body mass index is 26.69 kg/m as calculated from the following:   Height as of this encounter: '5\' 6"'$  (1.676 m).   Weight as  of this encounter: 75 kg.    Nutritional Assessment: Body mass index is 26.69 kg/m.Marland Kitchen Seen by dietician.  I agree with the assessment and plan as outlined below: Nutrition Status: Nutrition Problem: Inadequate oral intake Etiology: inability to  eat Signs/Symptoms: NPO status Interventions: Tube feeding  . Skin Assessment: I have examined the patient's skin and I agree with the wound assessment as performed by the wound care RN as outlined below:    Consultants:  Neurology  Procedures:  None  Antimicrobials:  Anti-infectives (From admission, onward)    Start     Dose/Rate Route Frequency Ordered Stop   06/06/22 1730  ceFEPIme (MAXIPIME) 2 g in sodium chloride 0.9 % 100 mL IVPB        2 g 200 mL/hr over 30 Minutes Intravenous Every 12 hours 06/06/22 1627 06/09/22 1649   06/05/22 2200  vancomycin (VANCOREADY) IVPB 750 mg/150 mL  Status:  Discontinued        750 mg 150 mL/hr over 60 Minutes Intravenous Every 24 hours 06/04/22 2328 06/05/22 1019   06/04/22 2330  ceFEPIme (MAXIPIME) 2 g in sodium chloride 0.9 % 100 mL IVPB  Status:  Discontinued        2 g 200 mL/hr over 30 Minutes Intravenous Every 24 hours 06/04/22 2324 06/06/22 1627   06/04/22 2330  vancomycin (VANCOREADY) IVPB 1500 mg/300 mL        1,500 mg 150 mL/hr over 120 Minutes Intravenous  Once 06/04/22 2324 06/05/22 0233   06/04/22 2230  cefTRIAXone (ROCEPHIN) 2 g in sodium chloride 0.9 % 100 mL IVPB  Status:  Discontinued        2 g 200 mL/hr over 30 Minutes Intravenous Every 24 hours 06/04/22 2223 06/04/22 2224   06/04/22 2230  azithromycin (ZITHROMAX) 500 mg in sodium chloride 0.9 % 250 mL IVPB  Status:  Discontinued        500 mg 250 mL/hr over 60 Minutes Intravenous Every 24 hours 06/04/22 2223 06/04/22 2224   06/04/22 1730  cefTRIAXone (ROCEPHIN) 2 g in sodium chloride 0.9 % 100 mL IVPB  Status:  Discontinued        2 g 200 mL/hr over 30 Minutes Intravenous Every 24 hours 06/04/22 1725 06/04/22 2321   06/04/22 1730  azithromycin (ZITHROMAX) 500 mg in sodium chloride 0.9 % 250 mL IVPB  Status:  Discontinued        500 mg 250 mL/hr over 60 Minutes Intravenous Every 24 hours 06/04/22 1725 06/04/22 2321         Subjective:  No significant events  overnight as discussed with staff, she denies any complaints today   Objective: Vitals:   06/14/22 2200 06/14/22 2333 06/15/22 0300 06/15/22 0747  BP: 109/71 116/65 118/76 (!) 130/92  Pulse:  72 69 78  Resp: (!) '21 15 16 '$ (!) 21  Temp:   98.3 F (36.8 C) 98 F (36.7 C)  TempSrc:   Axillary Oral  SpO2: 98% 94% 98% 97%  Weight:      Height:       No intake or output data in the 24 hours ending 06/15/22 1205  Filed Weights   06/10/22 0400 06/12/22 0500 06/14/22 0500  Weight: 75.6 kg 75.9 kg 75 kg    Examination:   Awake Alert, Oriented X 3, No new F.N deficits, she is slow to respond, but she is appropriate, coherent, she is with cogwheel rigidity, mainly in upper extremities, and tardive dyskinesia movement mainly in the face. Symmetrical Chest  wall movement, Good air movement bilaterally, CTAB RRR,No Gallops,Rubs or new Murmurs, No Parasternal Heave +ve B.Sounds, Abd Soft, No tenderness, No rebound - guarding or rigidity. No Cyanosis, Clubbing or edema, No new Rash or bruise     Data Reviewed: I have personally reviewed following labs and imaging studies  CBC: Recent Labs  Lab 06/11/22 0303 06/12/22 0555 06/13/22 0810 06/14/22 0443 06/15/22 0507  WBC 9.2 10.1 9.7 8.6 10.1  HGB 12.8 12.8 12.6 12.1 12.1  HCT 38.1 39.9 39.3 38.0 36.7  MCV 85.8 87.9 88.3 89.6 87.6  PLT 234 284 267 243 185   Basic Metabolic Panel: Recent Labs  Lab 06/09/22 0303 06/10/22 0256 06/14/22 0443 06/15/22 0507  NA 143 142 139 134*  K 4.2 3.9 4.3 4.4  CL 107 108 104 99  CO2 '26 28 26 27  '$ GLUCOSE 160* 143* 144* 147*  BUN 20 20 27* 31*  CREATININE 0.85 0.81 0.95 1.00  CALCIUM 9.1 9.0 10.1 10.4*   GFR: Estimated Creatinine Clearance: 55 mL/min (by C-G formula based on SCr of 1 mg/dL). Liver Function Tests: No results for input(s): "AST", "ALT", "ALKPHOS", "BILITOT", "PROT", "ALBUMIN" in the last 168 hours.  No results for input(s): "LIPASE", "AMYLASE" in the last 168 hours. No  results for input(s): "AMMONIA" in the last 168 hours.  Coagulation Profile: No results for input(s): "INR", "PROTIME" in the last 168 hours.  Cardiac Enzymes: Recent Labs  Lab 06/15/22 0507  CKTOTAL 21*   BNP (last 3 results) No results for input(s): "PROBNP" in the last 8760 hours. HbA1C: No results for input(s): "HGBA1C" in the last 72 hours.  CBG: Recent Labs  Lab 06/14/22 1620 06/14/22 1935 06/14/22 2334 06/15/22 0231 06/15/22 0749  GLUCAP 166* 173* 147* 164* 164*   Lipid Profile: No results for input(s): "CHOL", "HDL", "LDLCALC", "TRIG", "CHOLHDL", "LDLDIRECT" in the last 72 hours. Thyroid Function Tests: No results for input(s): "TSH", "T4TOTAL", "FREET4", "T3FREE", "THYROIDAB" in the last 72 hours.  Anemia Panel: Recent Labs    06/14/22 2159  VITAMINB12 669  FOLATE 13.7   Sepsis Labs: No results for input(s): "PROCALCITON", "LATICACIDVEN" in the last 168 hours.   No results found for this or any previous visit (from the past 240 hour(s)).    Radiology Studies: MR BRAIN W CONTRAST  Result Date: 06/15/2022 CLINICAL DATA:  Headache.  History of sarcoidosis. EXAM: MRI HEAD WITH CONTRAST TECHNIQUE: Multiplanar, multiecho pulse sequences of the brain and surrounding structures were obtained with intravenous contrast. CONTRAST:  7.62m GADAVIST GADOBUTROL 1 MMOL/ML IV SOLN COMPARISON:  Brain MRI without contrast 06/13/2022 FINDINGS: There is no abnormal contrast enhancement within the brain. No abnormal calvarial or orbital enhancement. Specifically, there is no pachymeningeal enhancement as may be seen with intracranial hypotension. IMPRESSION: No pachymeningeal contrast enhancement or other postcontrast abnormality. Electronically Signed   By: KUlyses JarredM.D.   On: 06/15/2022 00:50   MR CERVICAL SPINE W WO CONTRAST  Result Date: 06/14/2022 CLINICAL DATA:  CSF leak suspected. EXAM: MRI CERVICAL SPINE WITHOUT AND WITH CONTRAST TECHNIQUE: Multiplanar and  multiecho pulse sequences of the cervical spine, to include the craniocervical junction and cervicothoracic junction, were obtained without and with intravenous contrast. CONTRAST:  7.519mGADAVIST GADOBUTROL 1 MMOL/ML IV SOLN COMPARISON:  None Available. FINDINGS: Alignment: Physiologic. Vertebrae: No fracture, evidence of discitis, or bone lesion. Cord: Normal signal and morphology. Posterior Fossa, vertebral arteries, paraspinal tissues: Negative. Disc levels: C1-2: Unremarkable. C2-3: Normal disc space and facet joints. There is no spinal canal  stenosis. No neural foraminal stenosis. C3-4: Mild left facet hypertrophy. There is no spinal canal stenosis. No neural foraminal stenosis. C4-5: Normal disc space and facet joints. There is no spinal canal stenosis. No neural foraminal stenosis. C5-6: Mild right facet hypertrophy. There is no spinal canal stenosis. No neural foraminal stenosis. C6-7: Normal disc space and facet joints. There is no spinal canal stenosis. No neural foraminal stenosis. C7-T1: Small central disc protrusion. There is no spinal canal stenosis. No neural foraminal stenosis. IMPRESSION: 1. No acute abnormality of the cervical spine. 2. Mild cervical degenerative disc disease without spinal canal or neural foraminal stenosis. Electronically Signed   By: Ulyses Jarred M.D.   On: 06/14/2022 23:49   MR BRAIN WO CONTRAST  Result Date: 06/13/2022 CLINICAL DATA:  Rule out stroke. EXAM: MRI HEAD WITHOUT CONTRAST TECHNIQUE: Multiplanar, multiecho pulse sequences of the brain and surrounding structures were obtained without intravenous contrast. COMPARISON:  CT Brain 06/04/22 FINDINGS: Brain: No acute infarction, hemorrhage, hydrocephalus, extra-axial collection or mass lesion.Sequela of mild-to-moderate chronic microvascular ischemic change. T2/FLAIR hyperintense signal is also seen within the central pons, favored to represent sequela of chronic microvascular ischemic change. Vascular: Normal flow  voids. Skull and upper cervical spine: Normal marrow signal. Sinuses/Orbits: Bilateral lens replacement. Mild mucosal thickening bilateral anterior ethmoid air cells. Other: None. IMPRESSION: No acute intracranial abnormality. Electronically Signed   By: Marin Roberts M.D.   On: 06/13/2022 16:34    Scheduled Meds:  apixaban  5 mg Per Tube BID   brimonidine  1 drop Both Eyes BID   And   timolol  1 drop Both Eyes BID   cholecalciferol  1,000 Units Per Tube Daily   clorazepate  7.5 mg Per Tube BID   diltiazem  60 mg Per Tube Q6H   feeding supplement (PROSource TF20)  60 mL Per Tube Daily   ferrous sulfate  300 mg Per Tube Q breakfast   flecainide  50 mg Per Tube BID   hydrALAZINE  100 mg Per Tube TID   insulin aspart  0-9 Units Subcutaneous Q4H   isosorbide dinitrate  10 mg Per Tube TID   latanoprost  1 drop Both Eyes QHS   multivitamin with minerals  1 tablet Per Tube Daily   mouth rinse  15 mL Mouth Rinse 4 times per day   valbenazine  40 mg Oral Daily   Continuous Infusions:  feeding supplement (JEVITY 1.2 CAL) 1,000 mL (06/12/22 1556)     LOS: 11 days   Phillips Climes, MD [facility]  06/15/2022, 12:05 PM   *Please note that this is a verbal dictation therefore any spelling or grammatical errors are due to the "[employer]" system interpretation.  Please page via [place] and do not message via secure chat for urgent patient care matters. Secure chat can be used for non urgent patient care matters.  How to contact the [facility] Attending or Consulting provider [place] or covering provider during after hours [place], for this patient?  Check the care team in [facility] and look for a) attending/consulting TR[facility] provider listed and b) the [facility] team listed. Page or secure chat [phone]. Log into [email or web address] and use 's universal password to access. If you do not have the password, please contact the hospital operator. Locate the [facility] provider you are looking for under [facility] and page to a number that you can be directly reached. If you still have difficulty reaching the provider, please page the [facility] (  Director on Call) for the Hospitalists listed on amion for assistance.

## 2022-06-15 NOTE — Progress Notes (Signed)
During dinner feeding of full liquids, pt still with significant coughing/aspiration. Pt consumed 95-100 percent of meal.

## 2022-06-15 NOTE — Progress Notes (Signed)
PT Cancellation Note  Patient Details Name: Tara [name] MRN: [id number] DOB: 1950-12-31   Cancelled Treatment:     Attempted to see pt for PT treatment. Upon entering room, RN notified therapist that transport was on the way to take pt to CT scan. Will attempt again as pt becomes available.    Betsey Holiday Lyn Hollingshead PT, DPT  06/15/2022, 12:03 PM

## 2022-06-15 NOTE — Plan of Care (Signed)
Neurology results update  MRI brain without and with contrast unremarkable (personal review)  MRI c spine wwo   1. No acute abnormality of the cervical spine. 2. Mild cervical degenerative disc disease without spinal canal or neural foraminal stenosis.  CT c/a/p  1. No convincing evidence of primary malignancy or metastatic disease within the chest, abdomen or pelvis. 2. New 4 mm left lower lobe ground-glass pulmonary nodule, favored infectious or inflammatory. No follow-up recommended. This recommendation follows the consensus statement: Guidelines for Management of Incidental Pulmonary Nodules Detected on CT Images: From the Fleischner Society 2017; Radiology 2017; 284:228-243. 3. Gallbladder is distended with some mild wall thickening. If there is clinical concern for acute cholecystitis consider further evaluation with nuclear medicine HIDA scan. 4. Moderate volume of formed stool in the colon with a large rectal stool ball and rectal wall thickening and stranding in the perirectal fat, findings which can be seen in the setting of stercoral colitis. 5. Colonic diverticulosis without findings of acute diverticulitis. 6. Gas in the vagina suggest correlation with recent instrumentation. 7.  Aortic Atherosclerosis (ICD10-I70.0).  Neurology will re-examine patient in f/u tomorrow.  Su Monks, MD [facility] [phone]  If 7pm- 7am, please page neurology on call as listed in [place].

## 2022-06-16 DIAGNOSIS — A419 Sepsis, unspecified organism: Secondary | ICD-10-CM | POA: Diagnosis not present

## 2022-06-16 DIAGNOSIS — R1310 Dysphagia, unspecified: Secondary | ICD-10-CM | POA: Diagnosis not present

## 2022-06-16 DIAGNOSIS — J189 Pneumonia, unspecified organism: Secondary | ICD-10-CM | POA: Diagnosis not present

## 2022-06-16 LAB — GLUCOSE, CAPILLARY
Glucose-Capillary: 116 mg/dL — ABNORMAL HIGH (ref 70–99)
Glucose-Capillary: 157 mg/dL — ABNORMAL HIGH (ref 70–99)
Glucose-Capillary: 183 mg/dL — ABNORMAL HIGH (ref 70–99)
Glucose-Capillary: 115 mg/dL — ABNORMAL HIGH (ref 70–99)
Glucose-Capillary: 167 mg/dL — ABNORMAL HIGH (ref 70–99)
Glucose-Capillary: 136 mg/dL — ABNORMAL HIGH (ref 70–99)

## 2022-06-16 LAB — CBC
HCT: 37 % (ref 36.0–46.0)
Hemoglobin: 12.1 g/dL (ref 12.0–15.0)
MCH: 28.7 pg (ref 26.0–34.0)
MCHC: 32.7 g/dL (ref 30.0–36.0)
MCV: 87.7 fL (ref 80.0–100.0)
Platelets: 242 10*3/uL (ref 150–400)
RBC: 4.22 MIL/uL (ref 3.87–5.11)
RDW: 16.6 % — ABNORMAL HIGH (ref 11.5–15.5)
WBC: 7.4 10*3/uL (ref 4.0–10.5)
nRBC: 0 % (ref 0.0–0.2)

## 2022-06-16 LAB — BASIC METABOLIC PANEL
Anion gap: 10 (ref 5–15)
BUN: 22 mg/dL (ref 8–23)
CO2: 25 mmol/L (ref 22–32)
Calcium: 10.4 mg/dL — ABNORMAL HIGH (ref 8.9–10.3)
Chloride: 99 mmol/L (ref 98–111)
Creatinine, Ser: 0.89 mg/dL (ref 0.44–1.00)
GFR, Estimated: >60 mL/min (ref >60)
Glucose, Bld: 122 mg/dL — ABNORMAL HIGH (ref 70–99)
Potassium: 4.5 mmol/L (ref 3.5–5.1)
Sodium: 134 mmol/L — ABNORMAL LOW (ref 135–145)

## 2022-06-16 MED ORDER — DILTIAZEM HCL 60 MG PO TABS: 60.0000 mg | ORAL_TABLET | Freq: Four times a day (QID) | ORAL | Status: DC

## 2022-06-16 MED ORDER — SORBITOL 70 % SOLN
960.0000 mL | TOPICAL_OIL | Freq: Once | ORAL | Status: AC
  Administered 2022-06-16: 960 mL via RECTAL
  Filled 2022-06-16: qty 240

## 2022-06-16 MED ORDER — POLYETHYLENE GLYCOL 3350 17 G PO PACK
17.0000 g | PACK | Freq: Two times a day (BID) | ORAL | Status: DC
  Administered 2022-06-16 – 2022-06-24 (×13): 17 g via ORAL
  Filled 2022-06-16 (×16): qty 1

## 2022-06-16 MED ORDER — BISACODYL 10 MG RE SUPP
10.0000 mg | Freq: Two times a day (BID) | RECTAL | Status: AC
  Administered 2022-06-16 – 2022-06-17 (×2): 10 mg via RECTAL
  Filled 2022-06-16 (×3): qty 1

## 2022-06-16 MED ORDER — CLORAZEPATE DIPOTASSIUM 3.75 MG PO TABS
7.5000 mg | ORAL_TABLET | Freq: Three times a day (TID) | ORAL | Status: DC
  Administered 2022-06-16 – 2022-06-17 (×2): 7.5 mg
  Filled 2022-06-16: qty 2

## 2022-06-16 MED ORDER — DILTIAZEM HCL 60 MG PO TABS
60.0000 mg | ORAL_TABLET | Freq: Three times a day (TID) | ORAL | Status: DC
  Administered 2022-06-16 – 2022-07-03 (×50): 60 mg
  Filled 2022-06-16 (×53): qty 1

## 2022-06-16 NOTE — Plan of Care (Signed)

## 2022-06-16 NOTE — Progress Notes (Signed)
Physical Therapy Treatment Patient Details Name: Tara [name] MRN: [id number] DOB: 1951/02/02 Today's Date: 06/16/2022   History of Present Illness 71 y.o. female admitted from [facility] with decreased responsiveness and not eating or drinking. Pt with multiple recent admission for weakness and falls. Patient recently had cardiology follow up, placed on zio patch on 03/26/22 for 2 weeks. Patient  since 9/11 has had 3 ED visit for non-intractable HA,last of which was 9/16 for which she was evaluated ,treated and discharged home in improve condition. PMH significant for CKD, sarcoidosis, HTN, obesity,OSA on CPAP, atrial fibrillation on Eliquis, hx of presyncope seen in ed 3 x since 8/29.    PT Comments    On arrival to room pt agreeable to attempt transfer to [facility], while talking to PTA pt had loose watery BM in bed. NT arrived to assist with clean up and linen change. Pt was able to stand in stedy frame with mod A to power up. She was transferred to [facility] to complete BM before standing and transfering to recliner chair via stedy.     Recommendations for follow up therapy are one component of a multi-disciplinary discharge planning process, led by the attending physician.  Recommendations may be updated based on patient status, additional functional criteria and insurance authorization.  Follow Up Recommendations  Skilled nursing-short term rehab (<3 hours/day) Can patient physically be transported by private vehicle: No   Assistance Recommended at Discharge Frequent or constant Supervision/Assistance  Patient can return home with the following Two people to help with walking and/or transfers;Assist for transportation;A lot of help with bathing/dressing/bathroom;Help with stairs or ramp for entrance   Equipment Recommendations  None recommended by PT    Recommendations for Other Services       Precautions / Restrictions Precautions Precautions: Fall Precaution Comments: watch HR and BP;  cortrak Restrictions Weight Bearing Restrictions: No     Mobility  Bed Mobility Overal bed mobility: Needs Assistance Bed Mobility: Supine to Sit, Rolling Rolling: Supervision, Min assist   Supine to sit: Min assist, HOB elevated     General bed mobility comments: able to roll for peri care with min a. Utalizing good technique as she bend up 1 knee and reached for the bed rails. Able to pull up into sitting EOB with min A for hips and utalizing bed rail for trunk elevation.    Transfers Overall transfer level: Needs assistance Equipment used: Ambulation equipment used Transfers: Sit to/from Stand, Bed to chair/wheelchair/BSC Sit to Stand: Mod assist           General transfer comment: pt able to stand with mod A for peri care. Transfered to [facility] and recliner chair via stedy. Transfer via Lift Equipment: Stedy  Ambulation/Gait                   Stairs             Wheelchair Mobility    Modified Rankin (Stroke Patients Only)       Balance Overall balance assessment: Needs assistance Sitting-balance support: Feet supported, Bilateral upper extremity supported Sitting balance-Leahy Scale: Fair Sitting balance - Comments: posterior lean at EOB Postural control: Posterior lean, Right lateral lean Standing balance support: Bilateral upper extremity supported, During functional activity Standing balance-Leahy Scale: Poor Standing balance comment: able to stand in stedy frame with min A  Cognition Arousal/Alertness: Lethargic Behavior During Therapy: Flat affect Overall Cognitive Status: No family/caregiver present to determine baseline cognitive functioning Area of Impairment: Problem solving, Following commands                       Following Commands: Follows one step commands with increased time, Follows one step commands consistently, Follows multi-step commands with increased time, Follows multi-step  commands inconsistently     Problem Solving: Slow processing, Requires verbal cues General Comments: more alert this session, but continues with delayed responses.        Exercises      General Comments General comments (skin integrity, edema, etc.): pt had large wattery BM in bed while talking to PTA before session. Nurse tech present and assisted in peri care      Pertinent Vitals/Pain Pain Assessment Pain Assessment: Faces Faces Pain Scale: Hurts little more Pain Location: headache "like a migraine" Pain Descriptors / Indicators: Discomfort, Headache Pain Intervention(s): Monitored during session, Limited activity within patient's tolerance, Repositioned    Home Living                          Prior Function            PT Goals (current goals can now be found in the care plan section) Acute Rehab PT Goals Patient Stated Goal: to find new rehab facility PT Goal Formulation: With patient Time For Goal Achievement: 06/22/22 Potential to Achieve Goals: Fair Progress towards PT goals: Progressing toward goals    Frequency    Min 2X/week      PT Plan Current plan remains appropriate    Co-evaluation              AM-PAC PT "6 Clicks" Mobility   Outcome Measure  Help needed turning from your back to your side while in a flat bed without using bedrails?: A Lot Help needed moving from lying on your back to sitting on the side of a flat bed without using bedrails?: A Lot Help needed moving to and from a bed to a chair (including a wheelchair)?: A Lot Help needed standing up from a chair using your arms (e.g., wheelchair or bedside chair)?: Total Help needed to walk in hospital room?: Total Help needed climbing 3-5 steps with a railing? : Total 6 Click Score: 9    End of Session Equipment Utilized During Treatment: Gait belt Activity Tolerance: Patient limited by fatigue;Treatment limited secondary to medical complications (Comment) (Need to have  BM) Patient left: in chair;with call bell/phone within reach Nurse Communication: Mobility status PT Visit Diagnosis: Other abnormalities of gait and mobility (R26.89);Muscle weakness (generalized) (M62.81)     Time: 6468-0321 PT Time Calculation (min) (ACUTE ONLY): 56 min  Charges:  $Therapeutic Activity: 53-67 mins                    Benjiman Core, PTA Acute Rehab   Allena Katz 06/16/2022, 1:43 PM

## 2022-06-16 NOTE — Progress Notes (Signed)
Neurology Progress Note  Subjective: -Tolerating resumption of home benzodiazepine well; primary team noted marked improvement in her symptoms on their evaluation in the morning but she remains quite symptomatic at the time of my evaluation  Exam: Vitals:   06/16/22 1612 06/16/22 1900  BP: (!) 136/93 129/70  Pulse: 76 88  Resp: 16 (!) 24  Temp: 98 F (36.7 C) (!) 97.4 F (36.3 C)  SpO2: 97% 97%   Gen: In bed, comfortable  Resp: non-labored breathing, no grossly audible wheezing Cardiac: Perfusing extremities well  Abd: soft, nt  Neuro: Awake, alert, fully oriented other than to the day of the week Continues to have a frequent tardive movements, primarily blinking, grimacing, but tongue movements are somewhat improved today.  Had some trace tardive dyskinetic movements of her right foot as well Motor: Remains full strength on motor testing Sensation intact to light touch throughout  Pertinent Labs:  Basic Metabolic Panel: Recent Labs  Lab 06/10/22 0256 06/14/22 0443 06/15/22 0507 06/16/22 0202  NA 142 139 134* 134*  K 3.9 4.3 4.4 4.5  CL 108 104 99 99  CO2 '28 26 27 25  '$ GLUCOSE 143* 144* 147* 122*  BUN 20 27* 31* 22  CREATININE 0.81 0.95 1.00 0.89  CALCIUM 9.0 10.1 10.4* 10.4*    CBC: Recent Labs  Lab 06/12/22 0555 06/13/22 0810 06/14/22 0443 06/15/22 0507 06/16/22 0202  WBC 10.1 9.7 8.6 10.1 7.4  HGB 12.8 12.6 12.1 12.1 12.1  HCT 39.9 39.3 38.0 36.7 37.0  MCV 87.9 88.3 89.6 87.6 87.7  PLT 284 267 243 242 242    Coagulation Studies: No results for input(s): "LABPROT", "INR" in the last 72 hours.    Impression: Suspect her movements did improve with administration of clonazepam given primary team's observations.  Given she still has significant symptoms at the time of my evaluation later in the afternoon, I will increase to 3 times daily  Recommendations: -Increase Tranxene to 3 times daily (8 AM, 2 PM, 8 PM) -Neurology will continue to  follow  [place] [phone]

## 2022-06-16 NOTE — Progress Notes (Signed)
PROGRESS NOTE    Tara [name]  GPQ:[id number] DOB: 09-Feb-1951 DOA: 06/04/2022 PCP: Biagio Borg, MD   Brief Narrative:   This is a 71 year old female nursing home resident with past medical history of CKD, depression, sarcoid, dyslipidemia, and hypertension.  Today the patient was noted to have decreased level of consciousness, 911 was called.   Patient brought to the ER, patient hypoxic into the 80s on room air.  Chest x-ray shows pneumonia.  She is currently on 4 L oxygen sats in the high 90s.  Patient remains lethargic and poorly responsive.  her work up significant for sepsis due to to aspiration PNA,  as well she was noted to have dysphagia, and by reviewing her records, apparently she was recently started on Ingrezza for Tadive dyskinesia.  Assessment & Plan:   Principal Problem:   Sepsis due to pneumonia [facility]) Active Problems:   Sarcoidosis   Hyperlipidemia   Essential hypertension   Elevated troponin   Obstructive sleep apnea   Lactic acidosis   Acute kidney injury superimposed on chronic kidney disease (HCC)   Acute liver failure   Hypernatremia   Dehydration   Anxiety and depression   Paroxysmal atrial fibrillation with RVR (HCC)  Severe sepsis and acute respiratory failure with hypoxia due to community-acquired pneumonia [facility]): -  Patient meets criteria for severe sepsis based on fever 102, tachycardia, tachypnea, leukocytosis, acute hypoxic respiratory failure and lactic acid of 2.3.  Chest x-ray suggest pneumonia/aspiration pneumonitis.  Completed 5 days of cefepime. Patient currently on room air.  All cultures are negative so far.  Dysphagia -Patient remains with significant dysphagia, on core track tube, been followed closely by SLP, was started on clear liquid diet.  Appetite is improving, but remains insufficient, continue with tube feed.  Tardive  dyskinesia -Patient was recently seen by movement clinic at [facility] (03/02/2022, note is on care everywhere) where  she was started on valbenazine 40 mg oral daily. -as well she was on Pristiq, which was held on admission. -Further management per neurology, resumed on her home dose clorazepate.  Gums appear to be much improved, will defer further management to neurology. -Follow on B1, vitamin EE, zinc, MMA level. -CTA chest/abdomen/pelvis is pending further evaluation to the extent of her sarcoidosis disease -No acute finding in  MRI brain with/without contrast.  Acute kidney injury superimposed on chronic kidney disease 3: - Baseline creatinine appears to be around 1.2, presented with 1.86.  Now improved.  Hypokalemia:  - Resolved.   Transaminitis  -Likely related to sepsis.   Elevated troponin: Patient had elevated but essentially flat troponin with she is not having any chest pain or shortness of breath, likely demand ischemia.  Transthoracic echo ruled out wall motion abnormality, 55% ejection fraction.    Atrial fibrillation with RVR: Has history of paroxysmal atrial fibrillation, was in RVR, likely in the setting of sepsis.  Patient very weak.  Rates controlled.  Continue Cardizem, flecainide and Eliquis.    Hypernatremia: Improved.     Essential hypertension/CAD Blood pressure elevated.  Continue Hydralazine, Cardizem CD, Imdur.   Obstructive sleep apnea -Patient protecting airways however, too ill for CPAP.   Hyperlipidemia -she is not on any medications for his.  Sarcoidosis: Please see above discussion  DVT prophylaxis: SCDs Start: 06/04/22 2221 heparin   Code Status: Full Code  Family Communication:  D/W sister by phone. Status is: Inpatient Remains inpatient appropriate because: Patient unable to swallow.  SLP following.   Estimated body mass index is  26.69 kg/m as calculated from the following:   Height as of this encounter: '5\' 6"'$  (1.676 m).   Weight as of this encounter: 75 kg.    Nutritional Assessment: Body mass index is 26.69 kg/m.Marland Kitchen Seen by dietician.  I agree with  the assessment and plan as outlined below: Nutrition Status: Nutrition Problem: Inadequate oral intake Etiology: inability to eat Signs/Symptoms: NPO status Interventions: Tube feeding  . Skin Assessment: I have examined the patient's skin and I agree with the wound assessment as performed by the wound care RN as outlined below:    Consultants:  Neurology  Procedures:  None  Antimicrobials:  Anti-infectives (From admission, onward)    Start     Dose/Rate Route Frequency Ordered Stop   06/06/22 1730  ceFEPIme (MAXIPIME) 2 g in sodium chloride 0.9 % 100 mL IVPB        2 g 200 mL/hr over 30 Minutes Intravenous Every 12 hours 06/06/22 1627 06/09/22 1649   06/05/22 2200  vancomycin (VANCOREADY) IVPB 750 mg/150 mL  Status:  Discontinued        750 mg 150 mL/hr over 60 Minutes Intravenous Every 24 hours 06/04/22 2328 06/05/22 1019   06/04/22 2330  ceFEPIme (MAXIPIME) 2 g in sodium chloride 0.9 % 100 mL IVPB  Status:  Discontinued        2 g 200 mL/hr over 30 Minutes Intravenous Every 24 hours 06/04/22 2324 06/06/22 1627   06/04/22 2330  vancomycin (VANCOREADY) IVPB 1500 mg/300 mL        1,500 mg 150 mL/hr over 120 Minutes Intravenous  Once 06/04/22 2324 06/05/22 0233   06/04/22 2230  cefTRIAXone (ROCEPHIN) 2 g in sodium chloride 0.9 % 100 mL IVPB  Status:  Discontinued        2 g 200 mL/hr over 30 Minutes Intravenous Every 24 hours 06/04/22 2223 06/04/22 2224   06/04/22 2230  azithromycin (ZITHROMAX) 500 mg in sodium chloride 0.9 % 250 mL IVPB  Status:  Discontinued        500 mg 250 mL/hr over 60 Minutes Intravenous Every 24 hours 06/04/22 2223 06/04/22 2224   06/04/22 1730  cefTRIAXone (ROCEPHIN) 2 g in sodium chloride 0.9 % 100 mL IVPB  Status:  Discontinued        2 g 200 mL/hr over 30 Minutes Intravenous Every 24 hours 06/04/22 1725 06/04/22 2321   06/04/22 1730  azithromycin (ZITHROMAX) 500 mg in sodium chloride 0.9 % 250 mL IVPB  Status:  Discontinued        500 mg 250  mL/hr over 60 Minutes Intravenous Every 24 hours 06/04/22 1725 06/04/22 2321         Subjective:  No significant events overnight as discussed with staff, she denies any complaints today, her Cardizem was held overnight due to soft blood pressure and low heart rate.  Objective: Vitals:   06/16/22 0522 06/16/22 0600 06/16/22 0745 06/16/22 1152  BP: 100/63 103/72 (!) 155/86 116/79  Pulse: 66 75 95 88  Resp: 12 14 (!) 25 20  Temp:  98 F (36.7 C) 98.3 F (36.8 C) 98 F (36.7 C)  TempSrc:  Oral Oral Oral  SpO2: 98% 96% 96% 95%  Weight:      Height:        Intake/Output Summary (Last 24 hours) at 06/16/2022 1432 Last data filed at 06/16/2022 1002 Gross per 24 hour  Intake 120 ml  Output --  Net 120 ml    Filed Weights   06/10/22  0400 06/12/22 0500 06/14/22 0500  Weight: 75.6 kg 75.9 kg 75 kg    Examination:     Awake Alert, Oriented X 3, No new F.N deficits, Normal affect, facial twitching, improved today, she is able to open her eyes for extended period of time, to talk full sentences, and extremities are much less rigid today. Symmetrical Chest wall movement, Good air movement bilaterally, CTAB RRR,No Gallops,Rubs or new Murmurs, No Parasternal Heave +ve B.Sounds, Abd Soft, No tenderness, No rebound - guarding or rigidity. No Cyanosis, Clubbing or edema, No new Rash or bruise     Data Reviewed: I have personally reviewed following labs and imaging studies  CBC: Recent Labs  Lab 06/12/22 0555 06/13/22 0810 06/14/22 0443 06/15/22 0507 06/16/22 0202  WBC 10.1 9.7 8.6 10.1 7.4  HGB 12.8 12.6 12.1 12.1 12.1  HCT 39.9 39.3 38.0 36.7 37.0  MCV 87.9 88.3 89.6 87.6 87.7  PLT 284 267 243 242 094   Basic Metabolic Panel: Recent Labs  Lab 06/10/22 0256 06/14/22 0443 06/15/22 0507 06/16/22 0202  NA 142 139 134* 134*  K 3.9 4.3 4.4 4.5  CL 108 104 99 99  CO2 '28 26 27 25  '$ GLUCOSE 143* 144* 147* 122*  BUN 20 27* 31* 22  CREATININE 0.81 0.95 1.00 0.89  CALCIUM  9.0 10.1 10.4* 10.4*   GFR: Estimated Creatinine Clearance: 61.8 mL/min (by C-G formula based on SCr of 0.89 mg/dL). Liver Function Tests: No results for input(s): "AST", "ALT", "ALKPHOS", "BILITOT", "PROT", "ALBUMIN" in the last 168 hours.  No results for input(s): "LIPASE", "AMYLASE" in the last 168 hours. No results for input(s): "AMMONIA" in the last 168 hours.  Coagulation Profile: No results for input(s): "INR", "PROTIME" in the last 168 hours.  Cardiac Enzymes: Recent Labs  Lab 06/15/22 0507  CKTOTAL 21*   BNP (last 3 results) No results for input(s): "PROBNP" in the last 8760 hours. HbA1C: No results for input(s): "HGBA1C" in the last 72 hours.  CBG: Recent Labs  Lab 06/15/22 1940 06/15/22 2334 06/16/22 0327 06/16/22 0747 06/16/22 1153  GLUCAP 193* 148* 116* 157* 183*   Lipid Profile: No results for input(s): "CHOL", "HDL", "LDLCALC", "TRIG", "CHOLHDL", "LDLDIRECT" in the last 72 hours. Thyroid Function Tests: No results for input(s): "TSH", "T4TOTAL", "FREET4", "T3FREE", "THYROIDAB" in the last 72 hours.  Anemia Panel: Recent Labs    06/14/22 2159  VITAMINB12 669  FOLATE 13.7   Sepsis Labs: No results for input(s): "PROCALCITON", "LATICACIDVEN" in the last 168 hours.   No results found for this or any previous visit (from the past 240 hour(s)).    Radiology Studies: CT CHEST ABDOMEN PELVIS W CONTRAST  Result Date: 06/15/2022 CLINICAL DATA:  Concern for occult malignancy, profound weight loss. History of sarcoidosis. * Tracking Code: BO * EXAM: CT CHEST, ABDOMEN, AND PELVIS WITH CONTRAST TECHNIQUE: Multidetector CT imaging of the chest, abdomen and pelvis was performed following the standard protocol during bolus administration of intravenous contrast. RADIATION DOSE REDUCTION: This exam was performed according to the departmental dose-optimization program which includes automated exposure control, adjustment of the mA and/or kV according to patient  size and/or use of iterative reconstruction technique. CONTRAST:  88m OMNIPAQUE IOHEXOL 350 MG/ML SOLN COMPARISON:  CT chest August 21, 2018. FINDINGS: CT CHEST FINDINGS Cardiovascular: Aortic atherosclerosis. No central pulmonary embolus on this nondedicated study. Mild cardiac enlargement. No significant pericardial effusion/thickening. Mediastinum/Nodes: Hypodense 12 mm nodule in the left lobe of the thyroid Not clinically significant; no follow-up imaging recommended (  ref: J Am Coll Radiol. 2015 Feb;12(2): 143-50).No pathologically enlarged mediastinal, hilar or axillary lymph nodes. Single prominent left axillary lymph node measures 6 mm in short axis on image 16/3 previously 8 mm on CT August 21, 2018. Enteric tube in the esophagus with tip in the stomach. Lungs/Pleura: Cluster of tiny nodules measure up to 3 mm pulmonary nodule in the left lung apex on image 24/4 is unchanged dating back to CT August 21, 2018. New 4 mm left lower lobe ground-glass pulmonary nodule on image 64/4. No additional pulmonary nodule identified on this motion degraded examination of the lungs. No pleural effusion. No pneumothorax. Musculoskeletal: Left humeral fixation hardware. Multilevel degenerative changes spine. No acute abnormality. CT ABDOMEN PELVIS FINDINGS Hepatobiliary: No suspicious hepatic lesion. Gallbladder is distended with some mild wall thickening. No biliary ductal dilation. No biliary ductal dilation. Pancreas: No pancreatic ductal dilation or evidence of acute inflammation. Spleen: No splenomegaly or focal splenic lesion. Adrenals/Urinary Tract: Lobular thickening of the left adrenal gland without discrete nodularity likely reflects hyperplasia/small adenomas and is considered benign requiring no independent imaging follow-up. Right adrenal gland appears normal. No hydronephrosis. Relative atrophy of the left kidney with lobular renal contour. Symmetric enhancement excretion of contrast in the bilateral  kidneys. Urinary bladder is unremarkable for degree of distension. Stomach/Bowel: Enteric tube tip in the stomach. No pathologic dilation of small or large bowel. Appendix is not confidently identified however there is no pericecal inflammation. Moderate volume of formed stool in the colon with a large rectal stool ball rectal wall thickening and stranding in the perirectal fat. Colonic diverticulosis without findings of acute diverticulitis. Vascular/Lymphatic: Aortic atherosclerosis. No pathologically enlarged abdominal or pelvic lymph nodes. Reproductive: Gas in the vagina suggest correlation with recent instrumentation. No suspicious uterine or adnexal lesion. Other: No significant abdominopelvic free fluid. Mild diffuse body wall edema. Musculoskeletal: No aggressive lytic or blastic lesion of bone. Multilevel degenerative changes spine. Degenerative changes bilateral hips. IMPRESSION: 1. No convincing evidence of primary malignancy or metastatic disease within the chest, abdomen or pelvis. 2. New 4 mm left lower lobe ground-glass pulmonary nodule, favored infectious or inflammatory. No follow-up recommended. This recommendation follows the consensus statement: Guidelines for Management of Incidental Pulmonary Nodules Detected on CT Images: From the Fleischner Society 2017; Radiology 2017; 284:228-243. 3. Gallbladder is distended with some mild wall thickening. If there is clinical concern for acute cholecystitis consider further evaluation with nuclear medicine HIDA scan. 4. Moderate volume of formed stool in the colon with a large rectal stool ball and rectal wall thickening and stranding in the perirectal fat, findings which can be seen in the setting of stercoral colitis. 5. Colonic diverticulosis without findings of acute diverticulitis. 6. Gas in the vagina suggest correlation with recent instrumentation. 7.  Aortic Atherosclerosis (ICD10-I70.0). Electronically Signed   By: Dahlia Bailiff M.D.   On:  06/15/2022 13:00   MR BRAIN W CONTRAST  Result Date: 06/15/2022 CLINICAL DATA:  Headache.  History of sarcoidosis. EXAM: MRI HEAD WITH CONTRAST TECHNIQUE: Multiplanar, multiecho pulse sequences of the brain and surrounding structures were obtained with intravenous contrast. CONTRAST:  7.52m GADAVIST GADOBUTROL 1 MMOL/ML IV SOLN COMPARISON:  Brain MRI without contrast 06/13/2022 FINDINGS: There is no abnormal contrast enhancement within the brain. No abnormal calvarial or orbital enhancement. Specifically, there is no pachymeningeal enhancement as may be seen with intracranial hypotension. IMPRESSION: No pachymeningeal contrast enhancement or other postcontrast abnormality. Electronically Signed   By: KUlyses JarredM.D.   On: 06/15/2022 00:50  MR CERVICAL SPINE W WO CONTRAST  Result Date: 06/14/2022 CLINICAL DATA:  CSF leak suspected. EXAM: MRI CERVICAL SPINE WITHOUT AND WITH CONTRAST TECHNIQUE: Multiplanar and multiecho pulse sequences of the cervical spine, to include the craniocervical junction and cervicothoracic junction, were obtained without and with intravenous contrast. CONTRAST:  7.38m GADAVIST GADOBUTROL 1 MMOL/ML IV SOLN COMPARISON:  None Available. FINDINGS: Alignment: Physiologic. Vertebrae: No fracture, evidence of discitis, or bone lesion. Cord: Normal signal and morphology. Posterior Fossa, vertebral arteries, paraspinal tissues: Negative. Disc levels: C1-2: Unremarkable. C2-3: Normal disc space and facet joints. There is no spinal canal stenosis. No neural foraminal stenosis. C3-4: Mild left facet hypertrophy. There is no spinal canal stenosis. No neural foraminal stenosis. C4-5: Normal disc space and facet joints. There is no spinal canal stenosis. No neural foraminal stenosis. C5-6: Mild right facet hypertrophy. There is no spinal canal stenosis. No neural foraminal stenosis. C6-7: Normal disc space and facet joints. There is no spinal canal stenosis. No neural foraminal stenosis. C7-T1:  Small central disc protrusion. There is no spinal canal stenosis. No neural foraminal stenosis. IMPRESSION: 1. No acute abnormality of the cervical spine. 2. Mild cervical degenerative disc disease without spinal canal or neural foraminal stenosis. Electronically Signed   By: KUlyses JarredM.D.   On: 06/14/2022 23:49    Scheduled Meds:  apixaban  5 mg Per Tube BID   bisacodyl  10 mg Rectal BID   brimonidine  1 drop Both Eyes BID   And   timolol  1 drop Both Eyes BID   cholecalciferol  1,000 Units Per Tube Daily   clorazepate  7.5 mg Per Tube BID   diltiazem  60 mg Per Tube Q8H   feeding supplement (PROSource TF20)  60 mL Per Tube Daily   ferrous sulfate  300 mg Per Tube Q breakfast   flecainide  50 mg Per Tube BID   hydrALAZINE  100 mg Per Tube TID   insulin aspart  0-9 Units Subcutaneous Q4H   isosorbide dinitrate  10 mg Per Tube TID   latanoprost  1 drop Both Eyes QHS   multivitamin with minerals  1 tablet Per Tube Daily   mouth rinse  15 mL Mouth Rinse 4 times per day   polyethylene glycol  17 g Oral BID   valbenazine  40 mg Oral Daily   Continuous Infusions:  feeding supplement (JEVITY 1.2 CAL) 1,000 mL (06/12/22 1556)     LOS: 12 days   DPhillips Climes MD [facility]  06/16/2022, 2:32 PM   *Please note that this is a verbal dictation therefore any spelling or grammatical errors are due to the "DKellyOne" system interpretation.  Please page via APerdido Beachand do not message via secure chat for urgent patient care matters. Secure chat can be used for non urgent patient care matters.  How to contact the TDevereux Treatment NetworkAttending or Consulting provider 7[place]or covering provider during after hours [place] for this patient?  Check the care team in [facility]Adv[facility] Hospitaland look for a) attending/consulting TR[facility] provider listed and b) the TEn[facility] Of North Alabamateam listed. Page or secure chat [phone]. Log into [email or web address] and use [place]'s universal password to access. If you do not have the password, please  contact the hospital operator. Locate the TGlendora Digestive Disease Instituteprovider you are looking for under [facility] and page to a number that you can be directly reached. If you still have difficulty reaching the provider, please page the [facility](Director on Call) for the  Hospitalists listed on amion for assistance.

## 2022-06-16 NOTE — Progress Notes (Signed)
TRH night cross cover note:   I was contacted by patient's RN requesting clarification on next scheduled dose of oral cardizem. RN notes that most recent BP (92/63) is slightly lower than prior, and HR's are in the low 60's. Not a/w any new symptoms and other VSS. Is due for her next scheduled ose of oral cardizem at this time.   I asked patient's RN to hold this next scheduled dose of po cardizem. Will continue to monitor BP/HR.     Babs Bertin, DO Hospitalist

## 2022-06-17 DIAGNOSIS — A419 Sepsis, unspecified organism: Secondary | ICD-10-CM | POA: Diagnosis not present

## 2022-06-17 DIAGNOSIS — R1310 Dysphagia, unspecified: Secondary | ICD-10-CM | POA: Diagnosis not present

## 2022-06-17 DIAGNOSIS — J189 Pneumonia, unspecified organism: Secondary | ICD-10-CM | POA: Diagnosis not present

## 2022-06-17 LAB — CBC
HCT: 37 % (ref 36.0–46.0)
Hemoglobin: 12 g/dL (ref 12.0–15.0)
MCH: 28.8 pg (ref 26.0–34.0)
MCHC: 32.4 g/dL (ref 30.0–36.0)
MCV: 88.7 fL (ref 80.0–100.0)
Platelets: 260 10*3/uL (ref 150–400)
RBC: 4.17 MIL/uL (ref 3.87–5.11)
RDW: 16.5 % — ABNORMAL HIGH (ref 11.5–15.5)
WBC: 10.1 10*3/uL (ref 4.0–10.5)
nRBC: 0 % (ref 0.0–0.2)

## 2022-06-17 LAB — BASIC METABOLIC PANEL
Anion gap: 11 (ref 5–15)
BUN: 22 mg/dL (ref 8–23)
CO2: 27 mmol/L (ref 22–32)
Calcium: 10.6 mg/dL — ABNORMAL HIGH (ref 8.9–10.3)
Chloride: 100 mmol/L (ref 98–111)
Creatinine, Ser: 0.98 mg/dL (ref 0.44–1.00)
GFR, Estimated: >60 mL/min (ref >60)
Glucose, Bld: 144 mg/dL — ABNORMAL HIGH (ref 70–99)
Potassium: 4.3 mmol/L (ref 3.5–5.1)
Sodium: 138 mmol/L (ref 135–145)

## 2022-06-17 LAB — VITAMIN E
Vitamin E (Alpha Tocopherol): 21.6 mg/L (ref 9.0–29.0)
Vitamin E(Gamma Tocopherol): 0.7 mg/L (ref 0.5–4.9)

## 2022-06-17 LAB — GLUCOSE, CAPILLARY
Glucose-Capillary: 119 mg/dL — ABNORMAL HIGH (ref 70–99)
Glucose-Capillary: 137 mg/dL — ABNORMAL HIGH (ref 70–99)
Glucose-Capillary: 140 mg/dL — ABNORMAL HIGH (ref 70–99)
Glucose-Capillary: 143 mg/dL — ABNORMAL HIGH (ref 70–99)
Glucose-Capillary: 144 mg/dL — ABNORMAL HIGH (ref 70–99)
Glucose-Capillary: 161 mg/dL — ABNORMAL HIGH (ref 70–99)

## 2022-06-17 MED ORDER — CLORAZEPATE DIPOTASSIUM 3.75 MG PO TABS
7.5000 mg | ORAL_TABLET | Freq: Two times a day (BID) | ORAL | Status: DC
  Administered 2022-06-17 – 2022-07-03 (×31): 7.5 mg
  Filled 2022-06-17 (×31): qty 2

## 2022-06-17 MED ORDER — POLYVINYL ALCOHOL 1.4 % OP SOLN
1.0000 [drp] | Freq: Three times a day (TID) | OPHTHALMIC | Status: DC
  Administered 2022-06-17 – 2022-07-03 (×46): 1 [drp] via OPHTHALMIC
  Filled 2022-06-17: qty 15

## 2022-06-17 MED ORDER — MAGNESIUM CITRATE PO SOLN
1.0000 | Freq: Once | ORAL | Status: AC
  Administered 2022-06-17: 1
  Filled 2022-06-17: qty 296

## 2022-06-17 MED ORDER — HEPARIN (PORCINE) 25000 UT/250ML-% IV SOLN
1200.0000 [IU]/h | INTRAVENOUS | Status: DC
  Administered 2022-06-17: 1100 [IU]/h via INTRAVENOUS
  Filled 2022-06-17: qty 250

## 2022-06-17 MED ORDER — POLYMYXIN B-TRIMETHOPRIM 10000-0.1 UNIT/ML-% OP SOLN
1.0000 [drp] | Freq: Three times a day (TID) | OPHTHALMIC | Status: AC
  Administered 2022-06-17 – 2022-06-19 (×8): 1 [drp] via OPHTHALMIC
  Filled 2022-06-17: qty 10

## 2022-06-17 NOTE — Progress Notes (Signed)
ANTICOAGULATION CONSULT NOTE - Follow Up Consult  Pharmacy Consult for Apixaban to Heparin Indication: atrial fibrillation  Allergies  Allergen Reactions   Fluoxetine Hcl Other (See Comments)    (PROZAC) Suicidal thoughts   Sulfa Antibiotics Rash   Chocolate Other (See Comments)    SOMETIMES CAUSES SEVERE HEADACHES   Floxin [Ocuflox] Anxiety    shaky   Other Nausea And Vomiting    INSTANT ICED TEA PACKETS   Sulfonamide Derivatives Rash    Patient Measurements: Height: '5\' 6"'$  (167.6 cm) Weight: 75.6 kg (166 lb 10.7 oz) IBW/kg (Calculated) : 59.3  Vital Signs: Temp: 98.3 F (36.8 C) (12/03 0851) Temp Source: Oral (12/03 0851) BP: 116/63 (12/03 0851) Pulse Rate: 77 (12/03 0851)  Labs: Recent Labs    06/15/22 0507 06/16/22 0202 06/17/22 0155  HGB 12.1 12.1 12.0  HCT 36.7 37.0 37.0  PLT 242 242 260  CREATININE 1.00 0.89 0.98  CKTOTAL 21*  --   --     Estimated Creatinine Clearance: 56.3 mL/min (by C-G formula based on SCr of 0.98 mg/dL).  Assessment: Transitioning back to heparin from apixaban for planned LP Last dose of apixaban this AM   Goal of Therapy:  PTT 66 to 102 seconds Heparin level 0.3-0.7  Monitor platelets by anticoagulation protocol: Yes   Plan:  Heparin at 1100 units / hr starting at 10 pm Daily heparin level, PTT, CBC  Thank you Anette Guarneri, PharmD 06/17/2022,9:20 AM

## 2022-06-17 NOTE — Progress Notes (Signed)
PROGRESS NOTE    Tara [name]  ZMO:[id number] DOB: Jul 28, 1950 DOA: 06/04/2022 PCP: Biagio Borg, MD   Brief Narrative:   This is a 71 year old female nursing home resident with past medical history of CKD, depression, sarcoid, dyslipidemia, and hypertension.  Today the patient was noted to have decreased level of consciousness, 911 was called.   Patient brought to the ER, patient hypoxic into the 80s on room air.  Chest x-ray shows pneumonia.  She is currently on 4 L oxygen sats in the high 90s.  Patient remains lethargic and poorly responsive.  her work up significant for sepsis due to to aspiration PNA,  as well she was noted to have dysphagia, and by reviewing her records, apparently she was recently started on Ingrezza for Tadive dyskinesia.  Assessment & Plan:   Principal Problem:   Sepsis due to pneumonia [facility]) Active Problems:   Sarcoidosis   Hyperlipidemia   Essential hypertension   Elevated troponin   Obstructive sleep apnea   Lactic acidosis   Acute kidney injury superimposed on chronic kidney disease (HCC)   Acute liver failure   Hypernatremia   Dehydration   Anxiety and depression   Paroxysmal atrial fibrillation with RVR (HCC)  Severe sepsis and acute respiratory failure with hypoxia due to community-acquired pneumonia [facility]): -  Patient meets criteria for severe sepsis based on fever 102, tachycardia, tachypnea, leukocytosis, acute hypoxic respiratory failure and lactic acid of 2.3.  Chest x-ray suggest pneumonia/aspiration pneumonitis.  Completed 5 days of cefepime. Patient currently on room air.  All cultures are negative so far.  Dysphagia -Patient remains with significant dysphagia, on cortrak tube, been followed closely by SLP, was started on clear liquid diet.  She appears more lethargic today, so she will be kept again NPO.   Tardive  dyskinesia -Patient was recently seen by movement clinic at [facility] (03/02/2022, note is on care everywhere) where she was  started on valbenazine 40 mg oral daily. -as well she was on Pristiq, which was held on admission. -Follow on B1, vitamin EE, zinc, MMA level. -CTA chest/abdomen/pelvis with no evidence of malignancy  -No acute finding in  MRI brain with/without contrast. -She has improved after resuming her home clorazepate, after discussion with neurology we will decrease back to twice daily as she had increased lethargy today.. -Needs LP, will change Eliquis to heparin..  Acute kidney injury superimposed on chronic kidney disease 3: - Baseline creatinine appears to be around 1.2, presented with 1.86.  Now improved.  Hypokalemia:  - Resolved.   Transaminitis  -Likely related to sepsis.   Elevated troponin: Patient had elevated but essentially flat troponin with she is not having any chest pain or shortness of breath, likely demand ischemia.  Transthoracic echo ruled out wall motion abnormality, 55% ejection fraction.    Atrial fibrillation with RVR: Has history of paroxysmal atrial fibrillation, was in RVR, likely in the setting of sepsis.  Patient very weak.  Rates controlled.  Continue Cardizem, flecainide and Eliquis.    Hypernatremia: Improved.     Essential hypertension/CAD Blood pressure elevated.  Continue Hydralazine, Cardizem CD, Imdur.   Obstructive sleep apnea -Patient protecting airways however, too ill for CPAP.   Hyperlipidemia -she is not on any medications for his.  Sarcoidosis: Please see above discussion  DVT prophylaxis: SCDs Start: 06/04/22 2221 heparin   Code Status: Full Code  Family Communication:  D/W sister by phone. Status is: Inpatient Remains inpatient appropriate because: Patient unable to swallow.  SLP following.  Estimated body mass index is 26.9 kg/m as calculated from the following:   Height as of this encounter: '5\' 6"'$  (1.676 m).   Weight as of this encounter: 75.6 kg.    Nutritional Assessment: Body mass index is 26.9 kg/m.Marland Kitchen Seen by dietician.  I  agree with the assessment and plan as outlined below: Nutrition Status: Nutrition Problem: Inadequate oral intake Etiology: inability to eat Signs/Symptoms: NPO status Interventions: Tube feeding  . Skin Assessment: I have examined the patient's skin and I agree with the wound assessment as performed by the wound care RN as outlined below:    Consultants:  Neurology  Procedures:  None  Antimicrobials:  Anti-infectives (From admission, onward)    Start     Dose/Rate Route Frequency Ordered Stop   06/06/22 1730  ceFEPIme (MAXIPIME) 2 g in sodium chloride 0.9 % 100 mL IVPB        2 g 200 mL/hr over 30 Minutes Intravenous Every 12 hours 06/06/22 1627 06/09/22 1649   06/05/22 2200  vancomycin (VANCOREADY) IVPB 750 mg/150 mL  Status:  Discontinued        750 mg 150 mL/hr over 60 Minutes Intravenous Every 24 hours 06/04/22 2328 06/05/22 1019   06/04/22 2330  ceFEPIme (MAXIPIME) 2 g in sodium chloride 0.9 % 100 mL IVPB  Status:  Discontinued        2 g 200 mL/hr over 30 Minutes Intravenous Every 24 hours 06/04/22 2324 06/06/22 1627   06/04/22 2330  vancomycin (VANCOREADY) IVPB 1500 mg/300 mL        1,500 mg 150 mL/hr over 120 Minutes Intravenous  Once 06/04/22 2324 06/05/22 0233   06/04/22 2230  cefTRIAXone (ROCEPHIN) 2 g in sodium chloride 0.9 % 100 mL IVPB  Status:  Discontinued        2 g 200 mL/hr over 30 Minutes Intravenous Every 24 hours 06/04/22 2223 06/04/22 2224   06/04/22 2230  azithromycin (ZITHROMAX) 500 mg in sodium chloride 0.9 % 250 mL IVPB  Status:  Discontinued        500 mg 250 mL/hr over 60 Minutes Intravenous Every 24 hours 06/04/22 2223 06/04/22 2224   06/04/22 1730  cefTRIAXone (ROCEPHIN) 2 g in sodium chloride 0.9 % 100 mL IVPB  Status:  Discontinued        2 g 200 mL/hr over 30 Minutes Intravenous Every 24 hours 06/04/22 1725 06/04/22 2321   06/04/22 1730  azithromycin (ZITHROMAX) 500 mg in sodium chloride 0.9 % 250 mL IVPB  Status:  Discontinued        500  mg 250 mL/hr over 60 Minutes Intravenous Every 24 hours 06/04/22 1725 06/04/22 2321         Subjective:  Patient this morning had couple of coughing episodes while drinking liquids, so she has been transitioned to n.p.o.  Objective: Vitals:   06/17/22 0400 06/17/22 0425 06/17/22 0500 06/17/22 0851  BP: 100/67 101/63  116/63  Pulse: 74 71  77  Resp: '13 18  19  '$ Temp: 98.6 F (37 C) 98.4 F (36.9 C)  98.3 F (36.8 C)  TempSrc: Oral Oral  Oral  SpO2: 95% 98%  97%  Weight:   75.6 kg   Height:        Intake/Output Summary (Last 24 hours) at 06/17/2022 1352 Last data filed at 06/17/2022 0400 Gross per 24 hour  Intake --  Output 1100 ml  Net -1100 ml    Filed Weights   06/12/22 0500 06/14/22 0500 06/17/22 0500  Weight: 75.9 kg 75 kg 75.6 kg    Examination:      Awake , he is less responsive today, more confused, less started to have facial movements.   Symmetrical Chest wall movement, Good air movement bilaterally, CTAB RRR,No Gallops,Rubs or new Murmurs, No Parasternal Heave +ve B.Sounds, Abd Soft, No tenderness, No rebound - guarding or rigidity. No Cyanosis, Clubbing or edema, No new Rash or bruise        Data Reviewed: I have personally reviewed following labs and imaging studies  CBC: Recent Labs  Lab 06/13/22 0810 06/14/22 0443 06/15/22 0507 06/16/22 0202 06/17/22 0155  WBC 9.7 8.6 10.1 7.4 10.1  HGB 12.6 12.1 12.1 12.1 12.0  HCT 39.3 38.0 36.7 37.0 37.0  MCV 88.3 89.6 87.6 87.7 88.7  PLT 267 243 242 242 536   Basic Metabolic Panel: Recent Labs  Lab 06/14/22 0443 06/15/22 0507 06/16/22 0202 06/17/22 0155  NA 139 134* 134* 138  K 4.3 4.4 4.5 4.3  CL 104 99 99 100  CO2 '26 27 25 27  '$ GLUCOSE 144* 147* 122* 144*  BUN 27* 31* 22 22  CREATININE 0.95 1.00 0.89 0.98  CALCIUM 10.1 10.4* 10.4* 10.6*   GFR: Estimated Creatinine Clearance: 56.3 mL/min (by C-G formula based on SCr of 0.98 mg/dL). Liver Function Tests: No results for input(s):  "AST", "ALT", "ALKPHOS", "BILITOT", "PROT", "ALBUMIN" in the last 168 hours.  No results for input(s): "LIPASE", "AMYLASE" in the last 168 hours. No results for input(s): "AMMONIA" in the last 168 hours.  Coagulation Profile: No results for input(s): "INR", "PROTIME" in the last 168 hours.  Cardiac Enzymes: Recent Labs  Lab 06/15/22 0507  CKTOTAL 21*   BNP (last 3 results) No results for input(s): "PROBNP" in the last 8760 hours. HbA1C: No results for input(s): "HGBA1C" in the last 72 hours.  CBG: Recent Labs  Lab 06/16/22 1615 06/16/22 1948 06/16/22 2311 06/17/22 0349 06/17/22 0855  GLUCAP 115* 167* 136* 144* 161*   Lipid Profile: No results for input(s): "CHOL", "HDL", "LDLCALC", "TRIG", "CHOLHDL", "LDLDIRECT" in the last 72 hours. Thyroid Function Tests: No results for input(s): "TSH", "T4TOTAL", "FREET4", "T3FREE", "THYROIDAB" in the last 72 hours.  Anemia Panel: Recent Labs    06/14/22 2159  VITAMINB12 669  FOLATE 13.7   Sepsis Labs: No results for input(s): "PROCALCITON", "LATICACIDVEN" in the last 168 hours.   No results found for this or any previous visit (from the past 240 hour(s)).    Radiology Studies: No results found.  Scheduled Meds:  bisacodyl  10 mg Rectal BID   brimonidine  1 drop Both Eyes BID   And   timolol  1 drop Both Eyes BID   cholecalciferol  1,000 Units Per Tube Daily   clorazepate  7.5 mg Per Tube BID   diltiazem  60 mg Per Tube Q8H   feeding supplement (PROSource TF20)  60 mL Per Tube Daily   ferrous sulfate  300 mg Per Tube Q breakfast   flecainide  50 mg Per Tube BID   hydrALAZINE  100 mg Per Tube TID   insulin aspart  0-9 Units Subcutaneous Q4H   isosorbide dinitrate  10 mg Per Tube TID   latanoprost  1 drop Both Eyes QHS   multivitamin with minerals  1 tablet Per Tube Daily   mouth rinse  15 mL Mouth Rinse 4 times per day   polyethylene glycol  17 g Oral BID   polyvinyl alcohol  1 drop Both Eyes  TID    trimethoprim-polymyxin b  1 drop Both Eyes TID   valbenazine  40 mg Oral Daily   Continuous Infusions:  feeding supplement (JEVITY 1.2 CAL) 1,000 mL (06/17/22 0601)   heparin       LOS: 13 days   Phillips Climes, MD [facility]  06/17/2022, 1:53 PM   *Please note that this is a verbal dictation therefore any spelling or grammatical errors are due to the "Miranda One" system interpretation.  Please page via [place] and do not message via secure chat for urgent patient care matters. Secure chat can be used for non urgent patient care matters.  How to contact the [facility] Attending or Consulting provider [place] or covering provider during after hours [place], for this patient?  Check the care team in [facility] and look for a) attending/consulting [facility] provider listed and b) the [facility] team listed. Page or secure chat [phone]. Log into [email or web address] and use [place]'s universal password to access. If you do not have the password, please contact the hospital operator. Locate the [facility] provider you are looking for under [facility] and page to a number that you can be directly reached. If you still have difficulty reaching the provider, please page the [facility] (Director on Call) for the Hospitalists listed on amion for assistance.

## 2022-06-17 NOTE — Progress Notes (Addendum)
Neurology Progress Note  Subjective: -Movements objectively improved this AM  -Patient distressed that her face doesn't feel right but can't describe it  Exam: Vitals:   06/17/22 0400 06/17/22 0425  BP: 100/67 101/63  Pulse: 74 71  Resp: 13 18  Temp: 98.6 F (37 C) 98.4 F (36.9 C)  SpO2: 95% 98%   Gen: In bed, comfortable  Resp: non-labored breathing, no grossly audible wheezing Eyes: Bilateral crusting and discharge Cardiac: Perfusing extremities well  Abd: soft, nt  Neuro: Awake, alert, fully oriented other than to the date (Reports Sunday 06/16/2022)  CN: PERRL, EOM notable for intermittent bilateral incomplete eye Abduction ?Partial INO, face symmetric, tongue with fewer tardive movements. Able to swallow with cues but distracted and leaves food in mouth if not directed to swallow Motor: Remains at least 4/5 strength on motor testing, effort is challenging today  Coordination: Can bring food smoothly to mouth x at least 3 bites with RUE Sensation intact to light touch throughout   Pertinent Labs:  Basic Metabolic Panel: Recent Labs  Lab 06/14/22 0443 06/15/22 0507 06/16/22 0202 06/17/22 0155  NA 139 134* 134* 138  K 4.3 4.4 4.5 4.3  CL 104 99 99 100  CO2 '26 27 25 27  '$ GLUCOSE 144* 147* 122* 144*  BUN 27* 31* 22 22  CREATININE 0.95 1.00 0.89 0.98  CALCIUM 10.1 10.4* 10.4* 10.6*     CBC: Recent Labs  Lab 06/13/22 0810 06/14/22 0443 06/15/22 0507 06/16/22 0202 06/17/22 0155  WBC 9.7 8.6 10.1 7.4 10.1  HGB 12.6 12.1 12.1 12.1 12.0  HCT 39.3 38.0 36.7 37.0 37.0  MCV 88.3 89.6 87.6 87.7 88.7  PLT 267 243 242 242 260     Coagulation Studies: No results for input(s): "LABPROT", "INR" in the last 72 hours.    Impression: Improved coordination and facial movements this morning at 8:30 AM but ocular inflammation and now possible partial INO bilaterally with subjective facial complaints without objective correlate. Query possibility of active  scarcoidosis. Have reached out to Dr. Tyrone Schimke, ophthalmology in case they are able to help eval for uveitis, may consider steroids for potential sarcoidosis flare  Recommendations: -Reduce Tranxene to 2 times daily (8 AM, 8 PM) due to sleepiness today -Appreciate d/w Ophthalmology -- they recommend poltyrim TID x 3 days and lubricant drops, do not feel eye findings would be c/w sarcoidosis changes -Heparin drip per pharmacy consult for [facility] until LP can be completed, will plan on Mon or Tues (24-48 hours after last apixaban dose 12/2 evening) -Neurology will continue to follow  Lesleigh Noe MD-PhD [facility] [phone]   Greater than 50 min in care, > 50% at bedside, d/w ophthalmology via phone and Dr. Waldron [facility] via secure chat

## 2022-06-18 DIAGNOSIS — J189 Pneumonia, unspecified organism: Secondary | ICD-10-CM | POA: Diagnosis not present

## 2022-06-18 DIAGNOSIS — D8689 Sarcoidosis of other sites: Secondary | ICD-10-CM

## 2022-06-18 DIAGNOSIS — R1310 Dysphagia, unspecified: Secondary | ICD-10-CM | POA: Diagnosis not present

## 2022-06-18 DIAGNOSIS — N179 Acute kidney failure, unspecified: Secondary | ICD-10-CM | POA: Diagnosis not present

## 2022-06-18 DIAGNOSIS — G527 Disorders of multiple cranial nerves: Secondary | ICD-10-CM

## 2022-06-18 DIAGNOSIS — A419 Sepsis, unspecified organism: Secondary | ICD-10-CM | POA: Diagnosis not present

## 2022-06-18 LAB — BASIC METABOLIC PANEL
Anion gap: 9 (ref 5–15)
BUN: 25 mg/dL — ABNORMAL HIGH (ref 8–23)
CO2: 26 mmol/L (ref 22–32)
Calcium: 10.1 mg/dL (ref 8.9–10.3)
Chloride: 102 mmol/L (ref 98–111)
Creatinine, Ser: 0.9 mg/dL (ref 0.44–1.00)
GFR, Estimated: >60 mL/min (ref >60)
Glucose, Bld: 164 mg/dL — ABNORMAL HIGH (ref 70–99)
Potassium: 4.1 mmol/L (ref 3.5–5.1)
Sodium: 137 mmol/L (ref 135–145)

## 2022-06-18 LAB — HEPARIN LEVEL (UNFRACTIONATED): Heparin Unfractionated: >1.1 IU/mL — ABNORMAL HIGH (ref 0.30–0.70)

## 2022-06-18 LAB — CBC
HCT: 38.4 % (ref 36.0–46.0)
Hemoglobin: 12.5 g/dL (ref 12.0–15.0)
MCH: 28.6 pg (ref 26.0–34.0)
MCHC: 32.6 g/dL (ref 30.0–36.0)
MCV: 87.9 fL (ref 80.0–100.0)
Platelets: 259 10*3/uL (ref 150–400)
RBC: 4.37 MIL/uL (ref 3.87–5.11)
RDW: 16.6 % — ABNORMAL HIGH (ref 11.5–15.5)
WBC: 7.3 10*3/uL (ref 4.0–10.5)
nRBC: 0 % (ref 0.0–0.2)

## 2022-06-18 LAB — VITAMIN B1: Vitamin B1 (Thiamine): 141.2 nmol/L (ref 66.5–200.0)

## 2022-06-18 LAB — APTT: aPTT: 62 seconds — ABNORMAL HIGH (ref 24–36)

## 2022-06-18 LAB — GLUCOSE, CAPILLARY
Glucose-Capillary: 126 mg/dL — ABNORMAL HIGH (ref 70–99)
Glucose-Capillary: 133 mg/dL — ABNORMAL HIGH (ref 70–99)
Glucose-Capillary: 151 mg/dL — ABNORMAL HIGH (ref 70–99)
Glucose-Capillary: 155 mg/dL — ABNORMAL HIGH (ref 70–99)
Glucose-Capillary: 177 mg/dL — ABNORMAL HIGH (ref 70–99)
Glucose-Capillary: 213 mg/dL — ABNORMAL HIGH (ref 70–99)

## 2022-06-18 MED ORDER — PANTOPRAZOLE SODIUM 40 MG IV SOLR
40.0000 mg | INTRAVENOUS | Status: DC
  Administered 2022-06-18 – 2022-07-03 (×15): 40 mg via INTRAVENOUS
  Filled 2022-06-18 (×15): qty 10

## 2022-06-18 MED ORDER — SODIUM CHLORIDE 0.9 % IV SOLN
1000.0000 mg | Freq: Every day | INTRAVENOUS | Status: AC
  Administered 2022-06-18 – 2022-06-22 (×5): 1000 mg via INTRAVENOUS
  Filled 2022-06-18 (×5): qty 16

## 2022-06-18 MED ORDER — PANTOPRAZOLE SODIUM 40 MG PO TBEC
40.0000 mg | DELAYED_RELEASE_TABLET | Freq: Every day | ORAL | Status: DC
  Administered 2022-06-18: 40 mg via ORAL

## 2022-06-18 MED ORDER — APIXABAN 5 MG PO TABS
5.0000 mg | ORAL_TABLET | Freq: Two times a day (BID) | ORAL | Status: DC
  Administered 2022-06-18 – 2022-06-19 (×3): 5 mg
  Filled 2022-06-18 (×3): qty 1

## 2022-06-18 NOTE — Progress Notes (Signed)
Speech Language Pathology Treatment: Dysphagia  Patient Details Name: Tara [name] MRN: [id number] DOB: [birth year] Today's Date: 06/18/2022 Time: [id number] SLP Time Calculation (min) (ACUTE ONLY): 12 min  Assessment / Plan / Recommendation Clinical Impression  Pt was made NPO since last seen by SLP (previously on full liquid diet) due to lethargy and coughing with PO intake. Today pt keeps her eyes closed but is alert and responsive to SLP, although often with delayed responses. She follows commands too. Pt has anterior loss of saliva prior to engaging in any POs, and she says she is aware of it when asked and will perform a volitional swallow to command, but she does not seem to be spontaneously managing her secretions. When given straw sips of water there is no overt coughing but she continues to have anterior loss. Multiple subswallows are noted as well. When cued to seal her lips more tightly, she can keep the liquids from coming back out of her mouth. She holds purees better in her oral cavity but there is also mild but diffuse oral residue observed, not cleared until a liquid wash is administered. Pt says she has been coughing frequently with thin liquids. Given this subjective complaint as well as signs of dysphagia and concern for PNA and possible aspiration on imaging, would consider MBS as cognitively able to participate. She seems appropriate this afternoon, but this seems to be fluctuating per chart. For now, would leave NPO with cortrak but would let pt have small, single sips of water with staff when alert enough. Will plan for MBS as can be scheduled with radiology and as mentation allows.   HPI HPI: Pt is a 71 year old female admitted from SNF with severe sepsis and respiratory failure due to PNA. CXR shows hazy airspace opacity at both lung bases, left greater than right, suspicious for pneumonia or aspiration pneumonitis. PMH includes: tardive dyskinesia (started tx in August through  [facility] movement clinic), CKD, depression, sarcoidosis, dyslipidemia, and hypertension.      SLP Plan  MBS      Recommendations for follow up therapy are one component of a multi-disciplinary discharge planning process, led by the attending physician.  Recommendations may be updated based on patient status, additional functional criteria and insurance authorization.    Recommendations  Diet recommendations: NPO;Other(comment) (could have small amounts of water with staff in small single sips) Medication Administration: Crushed with puree                Oral Care Recommendations: Oral care QID;Staff/trained caregiver to provide oral care Follow Up Recommendations: Skilled nursing-short term rehab (<3 hours/day) Assistance recommended at discharge: Frequent or constant Supervision/Assistance SLP Visit Diagnosis: Dysphagia, unspecified (R13.10) Plan: MBS           Osie Bond., M.A. [place] Office [phone]  Secure chat preferred   06/18/2022, 4:36 PM

## 2022-06-18 NOTE — Progress Notes (Signed)
PROGRESS NOTE    Tara [name]  XAJ:[id number] DOB: [birth year] DOA: 06/04/2022 PCP: Biagio Borg, MD   Brief Narrative:   This is a 71 year old female nursing home resident with past medical history of CKD, depression, sarcoid, dyslipidemia, and hypertension.  Today the patient was noted to have decreased level of consciousness, 911 was called.   Patient brought to the ER, patient hypoxic into the 80s on room air.  Chest x-ray shows pneumonia.  She is currently on 4 L oxygen sats in the high 90s.  Patient remains lethargic and poorly responsive.  her work up significant for sepsis due to to aspiration PNA,  as well she was noted to have dysphagia, and by reviewing her records, apparently she was recently started on Ingrezza for Tadive dyskinesia.  Assessment & Plan:   Principal Problem:   Sepsis due to pneumonia [facility]) Active Problems:   Sarcoidosis   Hyperlipidemia   Essential hypertension   Elevated troponin   Obstructive sleep apnea   Lactic acidosis   Acute kidney injury superimposed on chronic kidney disease (HCC)   Acute liver failure   Hypernatremia   Dehydration   Anxiety and depression   Paroxysmal atrial fibrillation with RVR (HCC)  Severe sepsis and acute respiratory failure with hypoxia due to community-acquired pneumonia [facility]): -  Patient meets criteria for severe sepsis based on fever 102, tachycardia, tachypnea, leukocytosis, acute hypoxic respiratory failure and lactic acid of 2.3.  Chest x-ray suggest pneumonia/aspiration pneumonitis.  Completed 5 days of cefepime. Patient currently on room air.  All cultures are negative so far.  Dysphagia -Patient remains with significant dysphagia, she passed swallow evaluation to clear liquid couple days ago, but she was coughing yesterday, so now she is n.p.o. again, and SLP to reevaluate. - on cortrak tube.  Tardive  dyskinesia -Patient was recently seen by movement clinic at [place] (03/02/2022, note is on care  everywhere) where she was started on valbenazine 40 mg oral daily. -as well she was on Pristiq, which was held on admission. -Follow on B1, vitamin EE, zinc, MMA level. -CTA chest/abdomen/pelvis with no evidence of malignancy  -No acute finding in  MRI brain with/without contrast. -She has improved after resuming her home clorazepate. -I have discussed with neurology today, there is a concern about neurosarcoidosis, so they recommend stress dose steroids which have been started 1 g of IV Solu-Medrol daily, at this point no indication for LP, so I will switch her back to Eliquis and DC heparin GTT.  Acute kidney injury superimposed on chronic kidney disease 3: - Baseline creatinine appears to be around 1.2, presented with 1.86.  Now improved.  Hypokalemia:  - Resolved.   Transaminitis  -Likely related to sepsis.   Elevated troponin: Patient had elevated but essentially flat troponin with she is not having any chest pain or shortness of breath, likely demand ischemia.  Transthoracic echo ruled out wall motion abnormality, 55% ejection fraction.    Atrial fibrillation with RVR: Has history of paroxysmal atrial fibrillation, was in RVR, likely in the setting of sepsis.  Patient very weak.  Rates controlled.  Continue Cardizem, flecainide and Eliquis.    Hypernatremia: Improved.     Essential hypertension/CAD Blood pressure elevated.  Continue Hydralazine, Cardizem CD, Imdur.   Obstructive sleep apnea -Patient protecting airways however, too ill for CPAP.   Hyperlipidemia -she is not on any medications for his.  Sarcoidosis: Please see above discussion  DVT prophylaxis: SCDs Start: 06/04/22 2221    Code Status: Full  Code  Family Communication:  D/W sister by phone 12/1 Status is: Inpatient Remains inpatient appropriate because: Patient unable to swallow.  SLP following.   Estimated body mass index is 26.9 kg/m as calculated from the following:   Height as of this encounter: '5\' 6"'$   (1.676 m).   Weight as of this encounter: 75.6 kg.    Nutritional Assessment: Body mass index is 26.9 kg/m.Marland Kitchen Seen by dietician.  I agree with the assessment and plan as outlined below: Nutrition Status: Nutrition Problem: Inadequate oral intake Etiology: inability to eat Signs/Symptoms: NPO status Interventions: Tube feeding  . Skin Assessment: I have examined the patient's skin and I agree with the wound assessment as performed by the wound care RN as outlined below:    Consultants:  Neurology  Procedures:  None  Antimicrobials:  Anti-infectives (From admission, onward)    Start     Dose/Rate Route Frequency Ordered Stop   06/06/22 1730  ceFEPIme (MAXIPIME) 2 g in sodium chloride 0.9 % 100 mL IVPB        2 g 200 mL/hr over 30 Minutes Intravenous Every 12 hours 06/06/22 1627 06/09/22 1649   06/05/22 2200  vancomycin (VANCOREADY) IVPB 750 mg/150 mL  Status:  Discontinued        750 mg 150 mL/hr over 60 Minutes Intravenous Every 24 hours 06/04/22 2328 06/05/22 1019   06/04/22 2330  ceFEPIme (MAXIPIME) 2 g in sodium chloride 0.9 % 100 mL IVPB  Status:  Discontinued        2 g 200 mL/hr over 30 Minutes Intravenous Every 24 hours 06/04/22 2324 06/06/22 1627   06/04/22 2330  vancomycin (VANCOREADY) IVPB 1500 mg/300 mL        1,500 mg 150 mL/hr over 120 Minutes Intravenous  Once 06/04/22 2324 06/05/22 0233   06/04/22 2230  cefTRIAXone (ROCEPHIN) 2 g in sodium chloride 0.9 % 100 mL IVPB  Status:  Discontinued        2 g 200 mL/hr over 30 Minutes Intravenous Every 24 hours 06/04/22 2223 06/04/22 2224   06/04/22 2230  azithromycin (ZITHROMAX) 500 mg in sodium chloride 0.9 % 250 mL IVPB  Status:  Discontinued        500 mg 250 mL/hr over 60 Minutes Intravenous Every 24 hours 06/04/22 2223 06/04/22 2224   06/04/22 1730  cefTRIAXone (ROCEPHIN) 2 g in sodium chloride 0.9 % 100 mL IVPB  Status:  Discontinued        2 g 200 mL/hr over 30 Minutes Intravenous Every 24 hours 06/04/22  1725 06/04/22 2321   06/04/22 1730  azithromycin (ZITHROMAX) 500 mg in sodium chloride 0.9 % 250 mL IVPB  Status:  Discontinued        500 mg 250 mL/hr over 60 Minutes Intravenous Every 24 hours 06/04/22 1725 06/04/22 2321         Subjective: And has profuse diarrhea after she received multiple laxatives yesterday.  Objective: Vitals:   06/18/22 0800 06/18/22 0909 06/18/22 1200 06/18/22 1400  BP: 136/72  (!) 144/88 134/83  Pulse: 90 82 90 74  Resp: '20 19 18 17  '$ Temp: 97.9 F (36.6 C)  98 F (36.7 C)   TempSrc: Axillary  Axillary   SpO2: 94% 96% 97% 95%  Weight:      Height:        Intake/Output Summary (Last 24 hours) at 06/18/2022 1547 Last data filed at 06/18/2022 0400 Gross per 24 hour  Intake 5551.67 ml  Output 500 ml  Net  5051.67 ml    Filed Weights   06/12/22 0500 06/14/22 0500 06/17/22 0500  Weight: 75.9 kg 75 kg 75.6 kg    Examination:  Awake Alert, Oriented X 3, remains with facial grimacing, but appears to be improved from yesterday. Symmetrical Chest wall movement, Good air movement bilaterally, CTAB RRR,No Gallops,Rubs or new Murmurs, No Parasternal Heave +ve B.Sounds, Abd Soft, No tenderness, No rebound - guarding or rigidity. No Cyanosis, Clubbing or edema, No new Rash or bruise        Data Reviewed: I have personally reviewed following labs and imaging studies  CBC: Recent Labs  Lab 06/14/22 0443 06/15/22 0507 06/16/22 0202 06/17/22 0155 06/18/22 0827  WBC 8.6 10.1 7.4 10.1 7.3  HGB 12.1 12.1 12.1 12.0 12.5  HCT 38.0 36.7 37.0 37.0 38.4  MCV 89.6 87.6 87.7 88.7 87.9  PLT 243 242 242 260 188   Basic Metabolic Panel: Recent Labs  Lab 06/14/22 0443 06/15/22 0507 06/16/22 0202 06/17/22 0155 06/18/22 0827  NA 139 134* 134* 138 137  K 4.3 4.4 4.5 4.3 4.1  CL 104 99 99 100 102  CO2 '26 27 25 27 26  '$ GLUCOSE 144* 147* 122* 144* 164*  BUN 27* 31* 22 22 25*  CREATININE 0.95 1.00 0.89 0.98 0.90  CALCIUM 10.1 10.4* 10.4* 10.6* 10.1    GFR: Estimated Creatinine Clearance: 61.3 mL/min (by C-G formula based on SCr of 0.9 mg/dL). Liver Function Tests: No results for input(s): "AST", "ALT", "ALKPHOS", "BILITOT", "PROT", "ALBUMIN" in the last 168 hours.  No results for input(s): "LIPASE", "AMYLASE" in the last 168 hours. No results for input(s): "AMMONIA" in the last 168 hours.  Coagulation Profile: No results for input(s): "INR", "PROTIME" in the last 168 hours.  Cardiac Enzymes: Recent Labs  Lab 06/15/22 0507  CKTOTAL 21*   BNP (last 3 results) No results for input(s): "PROBNP" in the last 8760 hours. HbA1C: No results for input(s): "HGBA1C" in the last 72 hours.  CBG: Recent Labs  Lab 06/17/22 1925 06/17/22 2354 06/18/22 0349 06/18/22 0815 06/18/22 1132  GLUCAP 119* 137* 133* 177* 151*   Lipid Profile: No results for input(s): "CHOL", "HDL", "LDLCALC", "TRIG", "CHOLHDL", "LDLDIRECT" in the last 72 hours. Thyroid Function Tests: No results for input(s): "TSH", "T4TOTAL", "FREET4", "T3FREE", "THYROIDAB" in the last 72 hours.  Anemia Panel: No results for input(s): "VITAMINB12", "FOLATE", "FERRITIN", "TIBC", "IRON", "RETICCTPCT" in the last 72 hours.  Sepsis Labs: No results for input(s): "PROCALCITON", "LATICACIDVEN" in the last 168 hours.   No results found for this or any previous visit (from the past 240 hour(s)).    Radiology Studies: No results found.  Scheduled Meds:  apixaban  5 mg Per Tube BID   brimonidine  1 drop Both Eyes BID   And   timolol  1 drop Both Eyes BID   cholecalciferol  1,000 Units Per Tube Daily   clorazepate  7.5 mg Per Tube BID   diltiazem  60 mg Per Tube Q8H   feeding supplement (PROSource TF20)  60 mL Per Tube Daily   ferrous sulfate  300 mg Per Tube Q breakfast   flecainide  50 mg Per Tube BID   hydrALAZINE  100 mg Per Tube TID   insulin aspart  0-9 Units Subcutaneous Q4H   isosorbide dinitrate  10 mg Per Tube TID   latanoprost  1 drop Both Eyes QHS    multivitamin with minerals  1 tablet Per Tube Daily   mouth rinse  15 mL  Mouth Rinse 4 times per day   pantoprazole (PROTONIX) IV  40 mg Intravenous Q24H   polyethylene glycol  17 g Oral BID   polyvinyl alcohol  1 drop Both Eyes TID   trimethoprim-polymyxin b  1 drop Both Eyes TID   valbenazine  40 mg Oral Daily   Continuous Infusions:  feeding supplement (JEVITY 1.2 CAL) 1,000 mL (06/18/22 0155)   methylPREDNISolone (SOLU-MEDROL) injection       LOS: 14 days   Phillips Climes, MD [facility]  06/18/2022, 3:47 PM   *Please note that this is a verbal dictation therefore any spelling or grammatical errors are due to the "[place]" system interpretation.  Please page via [place] and do not message via secure chat for urgent patient care matters. Secure chat can be used for non urgent patient care matters.  How to contact the [facility] Attending or Consulting provider [place] or covering provider during after hours [place], for this patient?  Check the care team in [facility] and look for a) attending/consulting [facility] provider listed and b) the [facility] team listed. Page or secure chat [phone]. Log into [email or web address] and use [place]'s universal password to access. If you do not have the password, please contact the hospital operator. Locate the [facility] provider you are looking for under [facility] and page to a number that you can be directly reached. If you still have difficulty reaching the provider, please page the [facility] (Director on Call) for the Hospitalists listed on amion for assistance.

## 2022-06-18 NOTE — Progress Notes (Signed)
Neurology Progress Note  Subjective: -Movements improved overall, similar to yesterday -Has impaired EOM persistent - No new complaints today  Exam: Vitals:   06/18/22 1400 06/18/22 1600  BP: 134/83 (!) 146/94  Pulse: 74 80  Resp: 17 16  Temp:    SpO2: 95% 98%   Gen: In bed, comfortable  Resp: non-labored breathing, no grossly audible wheezing Eyes: Bilateral crusting and discharge Cardiac: Perfusing extremities well  Abd: soft, nt  Neuro: Awake, alert, fully oriented other than to the date (Reports Sunday 06/16/2022)  CN: PERRL, EOM notable for intermittent bilateral incomplete eye adduction, impaired upgaze on R, impaired downgaze on L, states both sides of face feel funny, R NLF flattening, tongue with fewer tardive movements. Motor: Remains at least 4/5 strength on motor testing, effort is challenging today  Coordination: no frank ataxia FNF Sensation intact to light touch throughout   Pertinent Labs:  Basic Metabolic Panel: Recent Labs  Lab 06/14/22 0443 06/15/22 0507 06/16/22 0202 06/17/22 0155 06/18/22 0827  NA 139 134* 134* 138 137  K 4.3 4.4 4.5 4.3 4.1  CL 104 99 99 100 102  CO2 '26 27 25 27 26  '$ GLUCOSE 144* 147* 122* 144* 164*  BUN 27* 31* 22 22 25*  CREATININE 0.95 1.00 0.89 0.98 0.90  CALCIUM 10.1 10.4* 10.4* 10.6* 10.1     CBC: Recent Labs  Lab 06/14/22 0443 06/15/22 0507 06/16/22 0202 06/17/22 0155 06/18/22 0827  WBC 8.6 10.1 7.4 10.1 7.3  HGB 12.1 12.1 12.1 12.0 12.5  HCT 38.0 36.7 37.0 37.0 38.4  MCV 89.6 87.6 87.7 88.7 87.9  PLT 243 242 242 260 259     Coagulation Studies: No results for input(s): "LABPROT", "INR" in the last 72 hours.    Impression: Patient with known sarcoidosis admitted with worsening tardive dyskinesia after stopping benzo, now improved after restarting. Patient with multiple cranial nerve deficits c/f neurosarcoid. Recommend IVMP. No indication for LP given no pathognomonic CSF findings for neurosarcoid and  no way to exclude neurologic involvement with CSF.  Recommendations: - Tranxene 2 times daily (8 AM, 8 PM)  - No indication for LP at this time, no c/f infection, OK to transition from heparin gtt back to home DOAC - Solumedrol 1g q 24 hrs x5 days - Neurology will continue to follow  Su Monks, MD [facility] [phone]  If [place], please page neurology on call as listed in [place].

## 2022-06-18 NOTE — Progress Notes (Signed)
ANTICOAGULATION CONSULT NOTE - Follow Up Consult  Pharmacy Consult for Apixaban to Heparin Indication: atrial fibrillation  Allergies  Allergen Reactions   Fluoxetine Hcl Other (See Comments)    (PROZAC) Suicidal thoughts   Sulfa Antibiotics Rash   Chocolate Other (See Comments)    SOMETIMES CAUSES SEVERE HEADACHES   Floxin [Ocuflox] Anxiety    shaky   Other Nausea And Vomiting    INSTANT ICED TEA PACKETS   Sulfonamide Derivatives Rash    Patient Measurements: Height: '5\' 6"'$  (167.6 cm) Weight: 75.6 kg (166 lb 10.7 oz) IBW/kg (Calculated) : 59.3  Vital Signs: Temp: 97.9 F (36.6 C) (12/04 0800) Temp Source: Axillary (12/04 0800) BP: 136/72 (12/04 0800) Pulse Rate: 90 (12/04 0800)  Labs: Recent Labs    06/16/22 0202 06/17/22 0155 06/18/22 0827  HGB 12.1 12.0 12.5  HCT 37.0 37.0 38.4  PLT 242 260 259  APTT  --   --  62*  HEPARINUNFRC  --   --  >1.10*  CREATININE 0.89 0.98 0.90     Estimated Creatinine Clearance: 61.3 mL/min (by C-G formula based on SCr of 0.9 mg/dL).  Assessment: Transitioning back to heparin from apixaban for planned LP Last dose of apixaban 12/3 AM  PTT 62 seconds, CBC  Goal of Therapy:  PTT 66 to 102 seconds Heparin level 0.3-0.7  Monitor platelets by anticoagulation protocol: Yes   Plan:  Heparin to 1200 units / hr Daily heparin level, PTT, CBC  Thank you Anette Guarneri, PharmD 06/18/2022,9:55 AM

## 2022-06-19 ENCOUNTER — Inpatient Hospital Stay (HOSPITAL_COMMUNITY): Payer: BC Managed Care – PPO

## 2022-06-19 DIAGNOSIS — R1310 Dysphagia, unspecified: Secondary | ICD-10-CM | POA: Diagnosis not present

## 2022-06-19 DIAGNOSIS — D869 Sarcoidosis, unspecified: Secondary | ICD-10-CM

## 2022-06-19 DIAGNOSIS — A419 Sepsis, unspecified organism: Secondary | ICD-10-CM | POA: Diagnosis not present

## 2022-06-19 DIAGNOSIS — N179 Acute kidney failure, unspecified: Secondary | ICD-10-CM | POA: Diagnosis not present

## 2022-06-19 DIAGNOSIS — I480 Paroxysmal atrial fibrillation: Secondary | ICD-10-CM

## 2022-06-19 DIAGNOSIS — J189 Pneumonia, unspecified organism: Secondary | ICD-10-CM | POA: Diagnosis not present

## 2022-06-19 LAB — GLUCOSE, CAPILLARY
Glucose-Capillary: 213 mg/dL — ABNORMAL HIGH (ref 70–99)
Glucose-Capillary: 219 mg/dL — ABNORMAL HIGH (ref 70–99)
Glucose-Capillary: 225 mg/dL — ABNORMAL HIGH (ref 70–99)
Glucose-Capillary: 232 mg/dL — ABNORMAL HIGH (ref 70–99)
Glucose-Capillary: 233 mg/dL — ABNORMAL HIGH (ref 70–99)
Glucose-Capillary: 247 mg/dL — ABNORMAL HIGH (ref 70–99)

## 2022-06-19 MED ORDER — ENOXAPARIN SODIUM 80 MG/0.8ML IJ SOSY
75.0000 mg | PREFILLED_SYRINGE | Freq: Two times a day (BID) | INTRAMUSCULAR | Status: DC
  Administered 2022-06-19 – 2022-06-21 (×4): 75 mg via SUBCUTANEOUS
  Filled 2022-06-19 (×4): qty 0.8

## 2022-06-19 NOTE — Progress Notes (Signed)
Speech Language Pathology Treatment: Dysphagia  Patient Details Name: Tara [name] MRN: [id number] DOB: 11/12/1950 Today's Date: 06/19/2022 Time: 0930-0950 SLP Time Calculation (min) (ACUTE ONLY): 20 min  Assessment / Plan / Recommendation Clinical Impression  Patient seen by SLP for skilled treatment focused on dysphagia goals. Patient was awake and alert in bed, continues to complain of "facial pain". She was receptive to some PO trials after SLP completed oral care. Initially, had mildly delayed cough after drinking thin liquids via sequential straw sips (water) but when SLP cued her to take single sips, she did not exhibit cough response. She tolerated 75% of cup of applesauce without overt s/s aspiration or penetration and with trace residue on tongue. SLP explained MBS test, which is scheduled for this afternoon.     HPI HPI: Tara [name] is a 71 year old female admitted from SNF with severe sepsis and respiratory failure due to PNA. CXR shows hazy airspace opacity at both lung bases, left greater than right, suspicious for pneumonia or aspiration pneumonitis. PMH includes: tardive dyskinesia (started tx in August through [facility] movement clinic), CKD, depression, sarcoidosis, dyslipidemia, and hypertension.      SLP Plan  Continue with current plan of care;MBS      Recommendations for follow up therapy are one component of a multi-disciplinary discharge planning process, led by the attending physician.  Recommendations may be updated based on patient status, additional functional criteria and insurance authorization.    Recommendations  Diet recommendations: NPO Medication Administration: Crushed with puree Postural Changes and/or Swallow Maneuvers: Seated upright 90 degrees                Oral Care Recommendations: Oral care QID;Staff/trained caregiver to provide oral care Follow Up Recommendations: Skilled nursing-short term rehab (<3 hours/day) Assistance recommended at discharge:  Frequent or constant Supervision/Assistance SLP Visit Diagnosis: Dysphagia, unspecified (R13.10) Plan: Continue with current plan of care;MBS           Sonia Baller, MA, CCC-SLP Speech Therapy

## 2022-06-19 NOTE — Progress Notes (Signed)
Modified Barium Swallow Progress Note  Patient Details  Name: Tara [name] MRN: [id number] Date of Birth: 12/12/1950  Today's Date: 06/19/2022  Modified Barium Swallow completed.  Full report located under Chart Review in the Imaging Section.  Brief recommendations include the following:  Clinical Impression  Patient presents with a severe oropharyngeal dysphagia as per this MBS. Barium consistencies tested included: honey thick, nectar thick and thin liquids. Patient exhibited oral transit delays, piecemeal swallowing and moderate amount of oral residuals post initial swallows with each consistency. Thin liquids via single straw sip were assessed first and patient exhibited swallow initiation delay at level of pyriform sinus and moderate amount of sensed aspiration which occured during the swallow. Patient grimacing when coughing and did appear to become anxious during coughing response. When coughing had subsided, SLP did assess her toleration with spoon sip of nectar thick liquids and a spoon sip of honey thick liquids. No penetration or aspiration with either of these consistencies, however they did result in moderate amount of barium residuals in vallecular sinus, pyriform sinus, posterior pharyngeal wall and just generally throughout pharynx. Presence of nasogastric feeding tube did appear to impact barium transit in esophagus but this is not likely the cause of her aspiration event and significant pharyngeal residuals. SLP is recommending continue NPO and allow PRN ice chips/water sips.   Swallow Evaluation Recommendations       SLP Diet Recommendations: NPO       Medication Administration: Via alternative means               Oral Care Recommendations: Oral care QID;Staff/trained caregiver to provide oral care       Sonia Baller, MA, CCC-SLP Speech Therapy

## 2022-06-19 NOTE — Progress Notes (Signed)
ANTICOAGULATION CONSULT NOTE  Pharmacy Consult:  Lovenox Indication: atrial fibrillation  Allergies  Allergen Reactions   Fluoxetine Hcl Other (See Comments)    (PROZAC) Suicidal thoughts   Sulfa Antibiotics Rash   Chocolate Other (See Comments)    SOMETIMES CAUSES SEVERE HEADACHES   Floxin [Ocuflox] Anxiety    shaky   Other Nausea And Vomiting    INSTANT ICED TEA PACKETS   Sulfonamide Derivatives Rash    Patient Measurements: Height: '5\' 6"'$  (167.6 cm) Weight: 75.7 kg (166 lb 14.2 oz) IBW/kg (Calculated) : 59.3  Vital Signs: Temp: 97.9 F (36.6 C) (12/05 1227) Temp Source: Oral (12/05 1227) BP: 132/76 (12/05 1227) Pulse Rate: 84 (12/05 1227)  Labs: Recent Labs    06/17/22 0155 06/18/22 0827  HGB 12.0 12.5  HCT 37.0 38.4  PLT 260 259  APTT  --  62*  HEPARINUNFRC  --  >1.10*  CREATININE 0.98 0.90    Estimated Creatinine Clearance: 61.4 mL/min (by C-G formula based on SCr of 0.9 mg/dL).   Assessment: 19 YOF with history of Afib to transition from Eliquis to Lovenox in anticipation of PEG placement, last dose 11/5 around 0930.  Renal function and CBC stable; no bleeding reported.  Goal of Therapy:  Anti-Xa level 0.6-1 units/ml 4hrs after LMWH dose given Monitor platelets by anticoagulation protocol: Yes   Plan:  D/C Eliquis Lovenox '75mg'$  SQ Q12H, start tonight CBC Q72H while on Lovenox  Chayanne Speir D. Mina Marble, PharmD, BCPS, [place] 06/19/2022, 3:44 PM

## 2022-06-19 NOTE — Progress Notes (Signed)
Physical Therapy Treatment Patient Details Name: Tara [name] MRN: [id number] DOB: [birth year] Today's Date: 06/19/2022   History of Present Illness 71 y.o. female admitted from [facility] SNF with decreased responsiveness and not eating or drinking. Pt with multiple recent admission for weakness and falls. Patient recently had cardiology follow up, placed on zio patch on 03/26/22 for 2 weeks. Patient  since 9/11 has had 3 ED visit for non-intractable HA,last of which was 9/16 for which she was evaluated ,treated and discharged home in improve condition. PMH significant for CKD, sarcoidosis, HTN, obesity,OSA on CPAP, atrial fibrillation on Eliquis, hx of presyncope seen in ed 3 x since 8/29.    PT Comments    Pt received in bed, soiled with loose BM. Standing trials bedside in stedy for pericare and bed linen change. Pt returned to bed at end of session, instead of recliner, due to being scheduled for MBS.   Recommendations for follow up therapy are one component of a multi-disciplinary discharge planning process, led by the attending physician.  Recommendations may be updated based on patient status, additional functional criteria and insurance authorization.  Follow Up Recommendations  Skilled nursing-short term rehab (<3 hours/day) Can patient physically be transported by private vehicle: No   Assistance Recommended at Discharge Frequent or constant Supervision/Assistance  Patient can return home with the following Two people to help with walking and/or transfers;Assist for transportation;A lot of help with bathing/dressing/bathroom;Help with stairs or ramp for entrance   Equipment Recommendations  None recommended by PT    Recommendations for Other Services       Precautions / Restrictions Precautions Precautions: Fall;Other (comment) Precaution Comments: watch HR and BP; cortrak     Mobility  Bed Mobility Overal bed mobility: Needs Assistance Bed Mobility: Supine to Sit, Sit to  Supine     Supine to sit: Mod assist, HOB elevated Sit to supine: Mod assist, +2 for safety/equipment   General bed mobility comments: assist with trunk and BLE    Transfers Overall transfer level: Needs assistance   Transfers: Sit to/from Stand Sit to Stand: +2 physical assistance, Mod assist           General transfer comment: sit to stand x 4 in stedy. HR into 130s with 1st, 2nd, and 4th standing trials. Up to 150 during 3rd standing trial. Transfer via Lift Equipment: Stedy  Ambulation/Gait                   Stairs             Wheelchair Mobility    Modified Rankin (Stroke Patients Only)       Balance Overall balance assessment: Needs assistance Sitting-balance support: Feet supported, Bilateral upper extremity supported Sitting balance-Leahy Scale: Fair     Standing balance support: Bilateral upper extremity supported, During functional activity, Reliant on assistive device for balance Standing balance-Leahy Scale: Poor                              Cognition Arousal/Alertness: Awake/alert Behavior During Therapy: Flat affect Overall Cognitive Status: No family/caregiver present to determine baseline cognitive functioning                                 General Comments: alert. Delayed responses        Exercises      General Comments General comments (skin integrity, edema, etc.):  Pt with loose BM in bed on arrival. Standing trials bedside for pericare. Bed linens changed.      Pertinent Vitals/Pain Pain Assessment Pain Assessment: Faces Faces Pain Scale: Hurts little more Pain Location: "facial pain" Pain Descriptors / Indicators: Discomfort, Constant Pain Intervention(s): Monitored during session    Home Living                          Prior Function            PT Goals (current goals can now be found in the care plan section) Acute Rehab PT Goals Patient Stated Goal: not  stated Progress towards PT goals: Progressing toward goals    Frequency    Min 2X/week      PT Plan Current plan remains appropriate    Co-evaluation              AM-PAC PT "6 Clicks" Mobility   Outcome Measure  Help needed turning from your back to your side while in a flat bed without using bedrails?: A Lot Help needed moving from lying on your back to sitting on the side of a flat bed without using bedrails?: A Lot Help needed moving to and from a bed to a chair (including a wheelchair)?: A Lot Help needed standing up from a chair using your arms (e.g., wheelchair or bedside chair)?: Total Help needed to walk in hospital room?: Total Help needed climbing 3-5 steps with a railing? : Total 6 Click Score: 9    End of Session Equipment Utilized During Treatment: Gait belt Activity Tolerance: Treatment limited secondary to medical complications (Comment);Patient limited by fatigue (tachy) Patient left: in bed;with call bell/phone within reach Nurse Communication: Mobility status PT Visit Diagnosis: Other abnormalities of gait and mobility (R26.89);Muscle weakness (generalized) (M62.81)     Time: 6160-7371 PT Time Calculation (min) (ACUTE ONLY): 29 min  Charges:  $Therapeutic Activity: 23-37 mins                     Lorrin Goodell, PT  Office # [phone] Pager [phone]    Lorriane Shire 06/19/2022, 12:51 PM

## 2022-06-19 NOTE — Progress Notes (Addendum)
PROGRESS NOTE    Tara [name]  DVV:[id number] DOB: [birth year] DOA: 06/04/2022 PCP: Biagio Borg, MD   Brief Narrative:   This is a 71 year old female nursing home resident with past medical history of CKD, depression, sarcoid, dyslipidemia, and hypertension.  Today the patient was noted to have decreased level of consciousness, 911 was called.   Patient brought to the ER, patient hypoxic into the 80s on room air.  Chest x-ray shows pneumonia.  She is currently on 4 L oxygen sats in the high 90s.  Patient remains lethargic and poorly responsive.  her work up significant for sepsis due to to aspiration PNA,  as well she was noted to have dysphagia, and by reviewing her records, apparently she was recently started on Ingrezza for Tadive dyskinesia.  Neurology were consulted, with concern of neurosarcoidosis for which she started on IV steroids.  Assessment & Plan:   Principal Problem:   Sepsis due to pneumonia [facility]) Active Problems:   Sarcoidosis   Hyperlipidemia   Essential hypertension   Elevated troponin   Obstructive sleep apnea   Lactic acidosis   Acute kidney injury superimposed on chronic kidney disease (HCC)   Acute liver failure   Hypernatremia   Dehydration   Anxiety and depression   Paroxysmal atrial fibrillation with RVR (HCC)  Severe sepsis and acute respiratory failure with hypoxia due to community-acquired pneumonia [facility]): -  Patient meets criteria for severe sepsis based on fever 102, tachycardia, tachypnea, leukocytosis, acute hypoxic respiratory failure and lactic acid of 2.3.  Chest x-ray suggest pneumonia/aspiration pneumonitis.  Completed 5 days of cefepime. Patient currently on room air.  All cultures are negative so far.  Dysphagia -Patient remains with significant dysphagia, she passed swallow evaluation to clear liquid couple days ago, but she was coughing yesterday, so now she is n.p.o. again, and SLP to reevaluate. - on cortrak tube. -plan for MBS by  SLP.  Addendum 5:15 pm : -Patient did very poorly on her MBS as discussed with SLP, she remains n.p.o., discussed with patient, and sister, about PEG tube, they are both agreeable, will place consult for IR, will change her Eliquis to full dose Lovenox anticipation of procedure.  Tardive  dyskinesia -Patient was recently seen by movement clinic at [facility] (03/02/2022, note is on care everywhere) where she was started on valbenazine 40 mg oral daily. -as well she was on Pristiq, which was held on admission. -Vitamin B1, vitamin E WNL - follow on , zinc, MMA level. -CTA chest/abdomen/pelvis with no evidence of malignancy  -No acute finding in  MRI brain with/without contrast. -She has improved after resuming her home clorazepate. -There is a concern for neurosarcoidosis contributing to her symptoms, continue with IV Solu-Medrol 1 g IV daily x 5 days per neuro recommendations.    Acute kidney injury superimposed on chronic kidney disease 3: - Baseline creatinine appears to be around 1.2, presented with 1.86.  Now improved.  Hypokalemia:  - Resolved.   Transaminitis  -Likely related to sepsis.   Elevated troponin: Patient had elevated but essentially flat troponin with she is not having any chest pain or shortness of breath, likely demand ischemia.  Transthoracic echo ruled out wall motion abnormality, 55% ejection fraction.    Atrial fibrillation with RVR: Has history of paroxysmal atrial fibrillation, was in RVR, likely in the setting of sepsis.  Patient very weak.  Rates controlled.  Continue Cardizem, flecainide and Eliquis.    Hypernatremia: Improved.     Essential hypertension/CAD Blood pressure  elevated.  Continue Hydralazine, Cardizem CD, Imdur.   Obstructive sleep apnea -Patient protecting airways however, too ill for CPAP.   Hyperlipidemia -she is not on any medications for his.  Sarcoidosis: Please see above discussion  DVT prophylaxis: SCDs Start: 06/04/22 2221     Code Status: Full Code  Family Communication:  D/W sister by phone 12/1,12/5. Status is: Inpatient Remains inpatient appropriate because: Patient unable to swallow.  SLP following.   Estimated body mass index is 26.94 kg/m as calculated from the following:   Height as of this encounter: '5\' 6"'$  (1.676 m).   Weight as of this encounter: 75.7 kg.    Nutritional Assessment: Body mass index is 26.94 kg/m.Marland Kitchen Seen by dietician.  I agree with the assessment and plan as outlined below: Nutrition Status: Nutrition Problem: Inadequate oral intake Etiology: inability to eat Signs/Symptoms: NPO status Interventions: Tube feeding  . Skin Assessment: I have examined the patient's skin and I agree with the wound assessment as performed by the wound care RN as outlined below:    Consultants:  Neurology  Procedures:  None  Antimicrobials:  Anti-infectives (From admission, onward)    Start     Dose/Rate Route Frequency Ordered Stop   06/06/22 1730  ceFEPIme (MAXIPIME) 2 g in sodium chloride 0.9 % 100 mL IVPB        2 g 200 mL/hr over 30 Minutes Intravenous Every 12 hours 06/06/22 1627 06/09/22 1649   06/05/22 2200  vancomycin (VANCOREADY) IVPB 750 mg/150 mL  Status:  Discontinued        750 mg 150 mL/hr over 60 Minutes Intravenous Every 24 hours 06/04/22 2328 06/05/22 1019   06/04/22 2330  ceFEPIme (MAXIPIME) 2 g in sodium chloride 0.9 % 100 mL IVPB  Status:  Discontinued        2 g 200 mL/hr over 30 Minutes Intravenous Every 24 hours 06/04/22 2324 06/06/22 1627   06/04/22 2330  vancomycin (VANCOREADY) IVPB 1500 mg/300 mL        1,500 mg 150 mL/hr over 120 Minutes Intravenous  Once 06/04/22 2324 06/05/22 0233   06/04/22 2230  cefTRIAXone (ROCEPHIN) 2 g in sodium chloride 0.9 % 100 mL IVPB  Status:  Discontinued        2 g 200 mL/hr over 30 Minutes Intravenous Every 24 hours 06/04/22 2223 06/04/22 2224   06/04/22 2230  azithromycin (ZITHROMAX) 500 mg in sodium chloride 0.9 % 250 mL IVPB   Status:  Discontinued        500 mg 250 mL/hr over 60 Minutes Intravenous Every 24 hours 06/04/22 2223 06/04/22 2224   06/04/22 1730  cefTRIAXone (ROCEPHIN) 2 g in sodium chloride 0.9 % 100 mL IVPB  Status:  Discontinued        2 g 200 mL/hr over 30 Minutes Intravenous Every 24 hours 06/04/22 1725 06/04/22 2321   06/04/22 1730  azithromycin (ZITHROMAX) 500 mg in sodium chloride 0.9 % 250 mL IVPB  Status:  Discontinued        500 mg 250 mL/hr over 60 Minutes Intravenous Every 24 hours 06/04/22 1725 06/04/22 2321         Subjective:  No significant events overnight, asking when she can eat.  Objective: Vitals:   06/19/22 0000 06/19/22 0400 06/19/22 0454 06/19/22 0819  BP: 122/84 108/72  (!) 166/78  Pulse: 77 71  81  Resp: '19 14  18  '$ Temp: 97.6 F (36.4 C) 98.2 F (36.8 C)  97.8 F (36.6 C)  TempSrc:  Oral Oral  Oral  SpO2: 97% 98%  94%  Weight:   75.7 kg   Height:        Intake/Output Summary (Last 24 hours) at 06/19/2022 1146 Last data filed at 06/19/2022 0000 Gross per 24 hour  Intake 62.99 ml  Output 450 ml  Net -387.01 ml    Filed Weights   06/14/22 0500 06/17/22 0500 06/19/22 0454  Weight: 75 kg 75.6 kg 75.7 kg    Examination:  Awake Alert, Oriented X 3, remains with facial grimacing, extremely frail and deconditioned.   Symmetrical Chest wall movement, Good air movement bilaterally, CTAB RRR,No Gallops,Rubs or new Murmurs, No Parasternal Heave +ve B.Sounds, Abd Soft, No tenderness, No rebound - guarding or rigidity. No Cyanosis, Clubbing or edema, No new Rash or bruise         Data Reviewed: I have personally reviewed following labs and imaging studies  CBC: Recent Labs  Lab 06/14/22 0443 06/15/22 0507 06/16/22 0202 06/17/22 0155 06/18/22 0827  WBC 8.6 10.1 7.4 10.1 7.3  HGB 12.1 12.1 12.1 12.0 12.5  HCT 38.0 36.7 37.0 37.0 38.4  MCV 89.6 87.6 87.7 88.7 87.9  PLT 243 242 242 260 798   Basic Metabolic Panel: Recent Labs  Lab  06/14/22 0443 06/15/22 0507 06/16/22 0202 06/17/22 0155 06/18/22 0827  NA 139 134* 134* 138 137  K 4.3 4.4 4.5 4.3 4.1  CL 104 99 99 100 102  CO2 '26 27 25 27 26  '$ GLUCOSE 144* 147* 122* 144* 164*  BUN 27* 31* 22 22 25*  CREATININE 0.95 1.00 0.89 0.98 0.90  CALCIUM 10.1 10.4* 10.4* 10.6* 10.1   GFR: Estimated Creatinine Clearance: 61.4 mL/min (by C-G formula based on SCr of 0.9 mg/dL). Liver Function Tests: No results for input(s): "AST", "ALT", "ALKPHOS", "BILITOT", "PROT", "ALBUMIN" in the last 168 hours.  No results for input(s): "LIPASE", "AMYLASE" in the last 168 hours. No results for input(s): "AMMONIA" in the last 168 hours.  Coagulation Profile: No results for input(s): "INR", "PROTIME" in the last 168 hours.  Cardiac Enzymes: Recent Labs  Lab 06/15/22 0507  CKTOTAL 21*   BNP (last 3 results) No results for input(s): "PROBNP" in the last 8760 hours. HbA1C: No results for input(s): "HGBA1C" in the last 72 hours.  CBG: Recent Labs  Lab 06/18/22 1614 06/18/22 2013 06/18/22 2347 06/19/22 0319 06/19/22 0821  GLUCAP 126* 155* 213* 219* 233*   Lipid Profile: No results for input(s): "CHOL", "HDL", "LDLCALC", "TRIG", "CHOLHDL", "LDLDIRECT" in the last 72 hours. Thyroid Function Tests: No results for input(s): "TSH", "T4TOTAL", "FREET4", "T3FREE", "THYROIDAB" in the last 72 hours.  Anemia Panel: No results for input(s): "VITAMINB12", "FOLATE", "FERRITIN", "TIBC", "IRON", "RETICCTPCT" in the last 72 hours.  Sepsis Labs: No results for input(s): "PROCALCITON", "LATICACIDVEN" in the last 168 hours.   No results found for this or any previous visit (from the past 240 hour(s)).    Radiology Studies: No results found.  Scheduled Meds:  apixaban  5 mg Per Tube BID   brimonidine  1 drop Both Eyes BID   And   timolol  1 drop Both Eyes BID   cholecalciferol  1,000 Units Per Tube Daily   clorazepate  7.5 mg Per Tube BID   diltiazem  60 mg Per Tube Q8H    feeding supplement (PROSource TF20)  60 mL Per Tube Daily   ferrous sulfate  300 mg Per Tube Q breakfast   flecainide  50 mg Per Tube BID  hydrALAZINE  100 mg Per Tube TID   insulin aspart  0-9 Units Subcutaneous Q4H   isosorbide dinitrate  10 mg Per Tube TID   latanoprost  1 drop Both Eyes QHS   multivitamin with minerals  1 tablet Per Tube Daily   mouth rinse  15 mL Mouth Rinse 4 times per day   pantoprazole (PROTONIX) IV  40 mg Intravenous Q24H   polyethylene glycol  17 g Oral BID   polyvinyl alcohol  1 drop Both Eyes TID   trimethoprim-polymyxin b  1 drop Both Eyes TID   valbenazine  40 mg Oral Daily   Continuous Infusions:  feeding supplement (JEVITY 1.2 CAL) 1,000 mL (06/18/22 0155)   methylPREDNISolone (SOLU-MEDROL) injection 1,000 mg (06/19/22 1001)     LOS: 15 days   Phillips Climes, MD [facility]  06/19/2022, 11:46 AM   *Please note that this is a verbal dictation therefore any spelling or grammatical errors are due to the "[place] [employer]" system interpretation.  Please page via [place] and do not message via secure chat for urgent patient care matters. Secure chat can be used for non urgent patient care matters.  How to contact the [facility] Attending or Consulting provider [place] or covering provider during after hours [place], for this patient?  Check the care team in [facility] and look for a) attending/consulting [facility] provider listed and b) the [facility] team listed. Page or secure chat [phone]. Log into [email or web address] and use [place]'s universal password to access. If you do not have the password, please contact the hospital operator. Locate the [facility] provider you are looking for under [facility] and page to a number that you can be directly reached. If you still have difficulty reaching the provider, please page the [facility] (Director on Call) for the Hospitalists listed on amion for assistance.

## 2022-06-20 DIAGNOSIS — I480 Paroxysmal atrial fibrillation: Secondary | ICD-10-CM | POA: Diagnosis not present

## 2022-06-20 DIAGNOSIS — J189 Pneumonia, unspecified organism: Secondary | ICD-10-CM | POA: Diagnosis not present

## 2022-06-20 DIAGNOSIS — A419 Sepsis, unspecified organism: Secondary | ICD-10-CM | POA: Diagnosis not present

## 2022-06-20 DIAGNOSIS — G4733 Obstructive sleep apnea (adult) (pediatric): Secondary | ICD-10-CM | POA: Diagnosis not present

## 2022-06-20 LAB — BASIC METABOLIC PANEL
Anion gap: 13 (ref 5–15)
BUN: 36 mg/dL — ABNORMAL HIGH (ref 8–23)
CO2: 24 mmol/L (ref 22–32)
Calcium: 10.5 mg/dL — ABNORMAL HIGH (ref 8.9–10.3)
Chloride: 97 mmol/L — ABNORMAL LOW (ref 98–111)
Creatinine, Ser: 1.32 mg/dL — ABNORMAL HIGH (ref 0.44–1.00)
GFR, Estimated: 44 mL/min — ABNORMAL LOW (ref >60)
Glucose, Bld: 231 mg/dL — ABNORMAL HIGH (ref 70–99)
Potassium: 4.9 mmol/L (ref 3.5–5.1)
Sodium: 134 mmol/L — ABNORMAL LOW (ref 135–145)

## 2022-06-20 LAB — GLUCOSE, CAPILLARY
Glucose-Capillary: 188 mg/dL — ABNORMAL HIGH (ref 70–99)
Glucose-Capillary: 197 mg/dL — ABNORMAL HIGH (ref 70–99)
Glucose-Capillary: 217 mg/dL — ABNORMAL HIGH (ref 70–99)
Glucose-Capillary: 240 mg/dL — ABNORMAL HIGH (ref 70–99)
Glucose-Capillary: 255 mg/dL — ABNORMAL HIGH (ref 70–99)
Glucose-Capillary: 276 mg/dL — ABNORMAL HIGH (ref 70–99)

## 2022-06-20 LAB — CBC
HCT: 40.6 % (ref 36.0–46.0)
Hemoglobin: 13.2 g/dL (ref 12.0–15.0)
MCH: 28.8 pg (ref 26.0–34.0)
MCHC: 32.5 g/dL (ref 30.0–36.0)
MCV: 88.5 fL (ref 80.0–100.0)
Platelets: 339 10*3/uL (ref 150–400)
RBC: 4.59 MIL/uL (ref 3.87–5.11)
RDW: 17 % — ABNORMAL HIGH (ref 11.5–15.5)
WBC: 17.8 10*3/uL — ABNORMAL HIGH (ref 4.0–10.5)
nRBC: 0 % (ref 0.0–0.2)

## 2022-06-20 LAB — METHYLMALONIC ACID, SERUM: Methylmalonic Acid, Quantitative: 249 nmol/L (ref 0–378)

## 2022-06-20 MED ORDER — CEFAZOLIN SODIUM-DEXTROSE 2-4 GM/100ML-% IV SOLN: 2.0000 g | INTRAVENOUS | Status: AC

## 2022-06-20 NOTE — Plan of Care (Signed)
  Problem: Education: Goal: Knowledge of General Education information will improve Description: Including pain rating scale, medication(s)/side effects and non-pharmacologic comfort measures Outcome: Progressing   Problem: Health Behavior/Discharge Planning: Goal: Ability to manage health-related needs will improve Outcome: Progressing   Problem: Clinical Measurements: Goal: Ability to maintain clinical measurements within normal limits will improve Outcome: Progressing Goal: Will remain free from infection Outcome: Progressing Goal: Diagnostic test results will improve Outcome: Progressing Goal: Respiratory complications will improve Outcome: Progressing Goal: Cardiovascular complication will be avoided Outcome: Progressing   Problem: Clinical Measurements: Goal: Will remain free from infection Outcome: Progressing   Problem: Clinical Measurements: Goal: Diagnostic test results will improve Outcome: Progressing

## 2022-06-20 NOTE — Progress Notes (Signed)
Occupational Therapy Treatment Patient Details Name: Tara [name] MRN: [id number] DOB: Jan 19, 1951 Today's Date: 06/20/2022   History of present illness 71 y.o. female admitted from [facility] SNF with decreased responsiveness and not eating or drinking. Pt with multiple recent admission for weakness and falls. Patient recently had cardiology follow up, placed on zio patch on 03/26/22 for 2 weeks. Patient  since 9/11 has had 3 ED visit for non-intractable HA,last of which was 9/16 for which she was evaluated ,treated and discharged home in improve condition. PMH significant for CKD, sarcoidosis, HTN, obesity,OSA on CPAP, atrial fibrillation on Eliquis, hx of presyncope seen in ed 3 x since 8/29.   OT comments  Pt in bed upon therapy arrival and agreeable to participate in tx session. Session focused on overall endurance, activity tolerance and sitting balance. Pt was able to tolerate sitting on EOB for greater than 5' with min physical assist and VC to correct and maintain sitting upright posture. BP when sitting EOB: 133/94. BP when returned to bed/ semi reclined posture: 129/89. HR remained at a normal level during session. Pt will continue to benefit from functional tasks at focus on core strength and activity tolerance to progress OOB activity. OT will continue to follow acutely.    Recommendations for follow up therapy are one component of a multi-disciplinary discharge planning process, led by the attending physician.  Recommendations may be updated based on patient status, additional functional criteria and insurance authorization.    Follow Up Recommendations  Skilled nursing-short term rehab (<3 hours/day)     Assistance Recommended at Discharge Frequent or constant Supervision/Assistance  Patient can return home with the following  Two people to help with walking and/or transfers;Two people to help with bathing/dressing/bathroom;Assistance with cooking/housework;Assistance with  feeding;Direct supervision/assist for medications management;Direct supervision/assist for financial management;Assist for transportation;Help with stairs or ramp for entrance   Equipment Recommendations  Other (comment) (defer to next venue of care)       Precautions / Restrictions Precautions Precautions: Fall;Other (comment) Precaution Comments: watch HR and BP; cortrak Restrictions Weight Bearing Restrictions: No       Mobility Bed Mobility Overal bed mobility: Needs Assistance Bed Mobility: Supine to Sit, Sit to Supine Rolling: Min guard   Supine to sit: Mod assist, HOB elevated Sit to supine: Min assist   General bed mobility comments: assist with trunk and BLE to transition to sitting. Required assist for BLE only when transitioning back to bed. Did assist with boosting self towards HOB once supine. Required total assist to complete fully.    Transfers Overall transfer level:  (Not performed this date)       Balance Overall balance assessment: Needs assistance Sitting-balance support: Feet unsupported, Bilateral upper extremity supported Sitting balance-Leahy Scale: Poor Sitting balance - Comments: Intermittent posterios lean demonstrated while seated EOB. 75% of the time, pt requires VC and tactile cues to correct sitting posture. On 3 occassions was able to self correct independently. Sat on EOB while therapist combed hair for 5-8 minutes. Postural control: Posterior lean         ADL either performed or assessed with clinical judgement      Cognition Arousal/Alertness: Awake/alert Behavior During Therapy: Flat affect Overall Cognitive Status: No family/caregiver present to determine baseline cognitive functioning Area of Impairment: Attention, Memory, Safety/judgement, Awareness     Following Commands: Follows one step commands consistently Safety/Judgement: Decreased awareness of deficits, Decreased awareness of safety   Problem Solving: Slow  processing, Requires verbal cues, Requires tactile cues General Comments:  Pt states that her face feels "funny," as if pieces of it are missing.                   Pertinent Vitals/ Pain       Pain Assessment Pain Assessment: No/denies pain Pain Score: 0-No pain         Frequency  Min 2X/week        Progress Toward Goals  OT Goals(current goals can now be found in the care plan section)  Progress towards OT goals: Progressing toward goals     Plan Discharge plan remains appropriate;Frequency remains appropriate       AM-PAC OT "6 Clicks" Daily Activity     Outcome Measure   Help from another person eating meals?: Total (Cortrak) Help from another person taking care of personal grooming?: A Lot Help from another person toileting, which includes using toliet, bedpan, or urinal?: Total Help from another person bathing (including washing, rinsing, drying)?: A Lot Help from another person to put on and taking off regular upper body clothing?: A Lot Help from another person to put on and taking off regular lower body clothing?: Total 6 Click Score: 9    End of Session    OT Visit Diagnosis: Muscle weakness (generalized) (M62.81);Other symptoms and signs involving cognitive function;Feeding difficulties (R63.3);History of falling (Z91.81);Other symptoms and signs involving the nervous system (R29.898)   Activity Tolerance Patient tolerated treatment well   Patient Left in bed;with call bell/phone within reach;with bed alarm set           Time: 4098-1191 OT Time Calculation (min): 26 min  Charges: OT General Charges $OT Visit: 1 Visit OT Treatments $Therapeutic Activity: 23-37 mins  Limmie Patricia, OTR/L,CBIS  Supplemental OT - [facility] and [facility]   [name], Charisse March 06/20/2022, 4:29 PM

## 2022-06-20 NOTE — Consult Note (Signed)
Chief Complaint: Patient was seen in consultation today for percutaneous gastric tube placement Chief Complaint  Patient presents with   Altered Mental Status   at the request of Dr Nena Alexander   Supervising Physician: Daryll Brod  Patient Status: [facility] - In-pt  History of Present Illness: Tara [name] is a 71 y.o. female   Hx sarcoid; tardive dyskinesia CKD; PAF (Eliquis); HTN; HLD Hypoxic respiratory failure secondary aspiration pna Neurosarcoidosis Dysphagia Poor po intake  Oropharyngeal dysphagia Felt to be due to tardive dyskinesia flare-possible multiple cranial nerve involvement from neurosarcoidosis flare  Request made for percutaneous gastric tube placement LD Eliquis today Will plan for IR procedure 12/8  Past Medical History:  Diagnosis Date   Anemia    on meds   ANKLE PAIN, LEFT 04/01/2008   ANXIETY 04/17/2007   on meds   Arthritis    generalized   Cataract    bilateral sx   Chronic kidney disease    stage 3   Colon polyp    COLONIC POLYPS, HX OF 08/01/2007   CONTUSIONS, MULTIPLE 04/01/2009   DEPRESSION 04/17/2007   on meds   DIZZINESS 08/01/2007   DYSPNEA 08/01/2007   with exertion   Enlargement of lymph nodes 08/13/2007   Excessive involuntary blinking    per pt,going on since 2018   Glaucoma    on meds   GLUCOSE INTOLERANCE 08/01/2007   Hypercalcemia due to sarcoidosis 2014   HYPERLIPIDEMIA 08/01/2007   no meds   HYPERSOMNIA 07/28/2008   HYPERTENSION 04/17/2007   Impaired glucose tolerance 03/23/2011   JOINT EFFUSION, LEFT KNEE 06/02/2010   Loose body in knee 04/01/2009   pt not sure?   Metabolic encephalopathy 03/50/0938-18/2993   Migraines    "stopped 3-4 yr ago" (07/23/2012)   Morbid obesity ([place]) 04/20/2007   OTHER DISEASES OF LUNG NOT ELSEWHERE CLASSIFIED 08/01/2007   Pain in joint, lower leg 04/01/2009   PERIPHERAL EDEMA 04/21/2009   Pre-diabetes    Sarcoidosis 09/25/2007   Seasonal allergies    SHOULDER PAIN, LEFT  04/01/2009   Sleep apnea    does not use Cpap    Past Surgical History:  Procedure Laterality Date   BREAST SURGERY     Biopsy benign/bil breaST   CARDIOVERSION N/A 12/15/2021   Procedure: CARDIOVERSION;  Surgeon: Skeet Latch, MD;  Location: [place];  Service: Cardiovascular;  Laterality: N/A;   CARDIOVERSION N/A 02/09/2022   Procedure: CARDIOVERSION;  Surgeon: [name], MD;  Location: [place];  Service: Cardiovascular;  Laterality: N/A;   CATARACT EXTRACTION     RIGHT EYE   COLONOSCOPY  2020   KN-MAC-suprep(good)-tics/TA's   COMBINED MEDIASTINOSCOPY AND BRONCHOSCOPY  08/2007   COMBINED MEDIASTINOSCOPY AND BRONCHOSCOPY  2009   Dental implant     FRACTURE SURGERY  ?02/1997   "left upper arm; put rod in" (07/23/2012)   GUM SURGERY  2000-?2009   "several ORs; soft tissue graft; took material from roof of mouth" (07/23/2012   HUMERUS SURGERY Left 1998   rod insertion   KNEE ARTHROSCOPY  03/2004; 06/2009   "right; left" Dr. Theda Sers   KNEE SURGERY  2012   ARTHROSCOPIC LEFT KNEE   LYMPH NODE BIOPSY  ~ 2009   "for sarcoidosis; don't know exactly which nodes" (07/23/2102)   [place]   Open   POLYPECTOMY  2020   TA's   REFRACTIVE SURGERY  08/1998   "both eyes" (07/23/2012)   REFRACTIVE SURGERY  2000   Bil   TOTAL KNEE  ARTHROPLASTY Left 09/24/2016   Procedure: TOTAL KNEE ARTHROPLASTY;  Surgeon: Dorna Leitz, MD;  Location: [place];  Service: Orthopedics;  Laterality: Left;    Allergies: Fluoxetine hcl, Sulfa antibiotics, Chocolate, Floxin [ocuflox], Other, and Sulfonamide derivatives  Medications: Prior to Admission medications   Medication Sig Start Date End Date Taking? Authorizing Provider  amoxicillin (AMOXIL) 500 MG tablet Take 2,000 mg by mouth once. Prior to dental appointments 08/03/19  Yes [provider]  antiseptic oral rinse (BIOTENE) LIQD 15 mLs by Mouth Rinse route 3 (three) times daily as needed for dry mouth or mouth pain.   Yes  [provider]  apixaban (ELIQUIS) 5 MG TABS tablet TAKE 1 TABLET(5 MG) BY MOUTH TWICE DAILY Patient taking differently: Take 5 mg by mouth 2 (two) times daily. 05/10/22  Yes Branch, Royetta Crochet, MD  brimonidine-timolol (COMBIGAN) 0.2-0.5 % ophthalmic solution Place 1 drop into both eyes every 12 (twelve) hours. 12/02/19  Yes [provider]  carboxymethylcellulose (REFRESH PLUS) 0.5 % SOLN Place 1 drop into both eyes 3 (three) times daily as needed (Dry eyes). RETAIN eye drops instead of refresh   Yes [provider]  cholecalciferol (VITAMIN D) 25 MCG (1000 UNIT) tablet Take 1,000 Units by mouth daily.   Yes [provider]  clorazepate (TRANXENE) 7.5 MG tablet Take 7.5 mg by mouth 2 (two) times daily.   Yes [provider]  desvenlafaxine (PRISTIQ) 100 MG 24 hr tablet Take 100 mg by mouth at bedtime.   Yes [provider]  diltiazem (CARDIZEM CD) 240 MG 24 hr capsule Take 1 capsule (240 mg total) by mouth daily. 03/16/22  Yes Biagio Borg, MD  ferrous sulfate 325 (65 FE) MG tablet Take 325 mg by mouth daily with breakfast.   Yes [provider]  flecainide (TAMBOCOR) 50 MG tablet Take 50 mg by mouth 2 (two) times daily.   Yes [provider]  hydrALAZINE (APRESOLINE) 100 MG tablet Take 1 tablet (100 mg total) by mouth 3 (three) times daily. 04/09/22  Yes Rai, Ripudeep K, MD  INGREZZA 40 MG capsule Take 40 mg by mouth daily. 03/14/22  Yes [provider]  isosorbide mononitrate (IMDUR) 30 MG 24 hr tablet Take 1 tablet (30 mg total) by mouth daily. 04/09/22  Yes Rai, Ripudeep K, MD  latanoprost (XALATAN) 0.005 % ophthalmic solution Place 1 drop into both eyes at bedtime. 08/25/16  Yes [provider]  Melatonin 5 MG TABS Take 10 mg by mouth at bedtime.   Yes [provider]  Multiple Vitamins-Minerals (MULTIVITAMIN WITH MINERALS) tablet Take 1 tablet by mouth daily.   Yes [provider]  Omega 3 1200  MG CAPS Take 2,400 mg by mouth at bedtime.   Yes [provider]  senna (SENOKOT) 8.6 MG tablet Take 1 tablet by mouth daily.   Yes [provider]  baclofen (LIORESAL) 10 MG tablet Take 5 mg by mouth 2 (two) times daily as needed (Headache). 09/01/19   [provider]     Family History  Problem Relation Age of Onset   Diabetes Mother    Hypertension Mother    Stroke Mother    Heart disease Father 37   Hypertension Father    Hypertension Sister    Asthma Sister    Gout Sister    Colon cancer Neg Hx    Breast cancer Neg Hx    Esophageal cancer Neg Hx    Stomach cancer Neg Hx  Rectal cancer Neg Hx    Colon polyps Neg Hx     Social History   Socioeconomic History   Marital status: Widowed    Spouse name: Not on file   Number of children: Not on file   Years of education: Not on file   Highest education level: Not on file  Occupational History   Occupation: Designer, jewellery  Tobacco Use   Smoking status: Never   Smokeless tobacco: Never   Tobacco comments:    Never smoke 12/25/21  Vaping Use   Vaping Use: Never used  Substance and Sexual Activity   Alcohol use: Yes    Alcohol/week: 1.0 - 2.0 standard drink of alcohol    Types: 1 - 2 Standard drinks or equivalent per week   Drug use: No   Sexual activity: Not Currently    Birth control/protection: Post-menopausal    Comment: 1st intercourse 16 yo-1 partner  Other Topics Concern   Not on file  Social History Narrative   Lives with 4 cats   Social Determinants of Health   Financial Resource Strain: Not on file  Food Insecurity: No Food Insecurity (06/14/2022)   Hunger Vital Sign    Worried About Running Out of Food in the Last Year: Never true    Ran Out of Food in the Last Year: Never true  Transportation Needs: No Transportation Needs (06/14/2022)   PRAPARE - Hydrologist (Medical): No    Lack of Transportation (Non-Medical): No  Physical Activity:  Not on file  Stress: Not on file  Social Connections: Not on file    Review of Systems: A 12 point ROS discussed and pertinent positives are indicated in the HPI above.  All other systems are negative.  Vital Signs: BP 114/64 (BP Location: Right Arm)   Pulse 76   Temp 97.9 F (36.6 C) (Oral)   Resp 15   Ht _0  (1.676 m)   Wt 167 lb 12.3 oz (76.1 kg)   SpO2 97%   BMI 27.08 kg/m      Physical Exam Vitals reviewed.  Constitutional:      Comments: Awake  Pt makes attempt to make words-- but unable   HENT:     Mouth/Throat:     Mouth: Mucous membranes are moist.  Cardiovascular:     Rate and Rhythm: Normal rate. Rhythm irregular.     Heart sounds: Normal heart sounds.  Pulmonary:     Effort: Pulmonary effort is normal.     Breath sounds: Rhonchi present.  Abdominal:     Palpations: Abdomen is soft.  Musculoskeletal:     Comments: Does not follow commands  Skin:    General: Skin is warm.  Neurological:     Comments: Spoke to sister Tara [name] via phone She is agreeable to procedure     Imaging: DG Swallowing Func-Speech Pathology  Result Date: 06/19/2022 Table formatting from the original result was not included. Objective Swallowing Evaluation: Type of Study: MBS-Modified Barium Swallow Study  Patient Details Name: Tara [name] MRN: [id number] Date of Birth: Oct 08, 1950 Today's Date: 06/19/2022 Time: SLP Start Time (ACUTE ONLY): 1325 -SLP Stop Time (ACUTE ONLY): 1345 SLP Time Calculation (min) (ACUTE ONLY): 20 min Past Medical History: Past Medical History: Diagnosis Date  Anemia   on meds  ANKLE PAIN, LEFT 04/01/2008  ANXIETY 04/17/2007  on meds  Arthritis   generalized  Cataract   bilateral sx  Chronic kidney disease   stage 3  Colon  polyp   COLONIC POLYPS, HX OF 08/01/2007  CONTUSIONS, MULTIPLE 04/01/2009  DEPRESSION 04/17/2007  on meds  DIZZINESS 08/01/2007  DYSPNEA 08/01/2007  with exertion  Enlargement of lymph nodes 08/13/2007  Excessive involuntary blinking   per  pt,going on since 2018  Glaucoma   on meds  GLUCOSE INTOLERANCE 08/01/2007  Hypercalcemia due to sarcoidosis 2014  HYPERLIPIDEMIA 08/01/2007  no meds  HYPERSOMNIA 07/28/2008  HYPERTENSION 04/17/2007  Impaired glucose tolerance 03/23/2011  JOINT EFFUSION, LEFT KNEE 06/02/2010  Loose body in knee 04/01/2009  pt not sure?  Metabolic encephalopathy 16/04/9603-54/0981  Migraines   "stopped 3-4 yr ago" (07/23/2012)  Morbid obesity ([place]) 04/20/2007  OTHER DISEASES OF LUNG NOT ELSEWHERE CLASSIFIED 08/01/2007  Pain in joint, lower leg 04/01/2009  PERIPHERAL EDEMA 04/21/2009  Pre-diabetes   Sarcoidosis 09/25/2007  Seasonal allergies   SHOULDER PAIN, LEFT 04/01/2009  Sleep apnea   does not use Cpap Past Surgical History: Past Surgical History: Procedure Laterality Date  BREAST SURGERY    Biopsy benign/bil breaST  CARDIOVERSION N/A 12/15/2021  Procedure: CARDIOVERSION;  Surgeon: Skeet Latch, MD;  Location: [place];  Service: Cardiovascular;  Laterality: N/A;  CARDIOVERSION N/A 02/09/2022  Procedure: CARDIOVERSION;  Surgeon: [name], MD;  Location: [place];  Service: Cardiovascular;  Laterality: N/A;  CATARACT EXTRACTION    RIGHT EYE  COLONOSCOPY  2020  KN-MAC-suprep(good)-tics/TA's  COMBINED MEDIASTINOSCOPY AND BRONCHOSCOPY  08/2007  COMBINED MEDIASTINOSCOPY AND BRONCHOSCOPY  2009  Dental implant    FRACTURE SURGERY  ?02/1997  "left upper arm; put rod in" (07/23/2012)  GUM SURGERY  2000-?2009  "several ORs; soft tissue graft; took material from roof of mouth" (07/23/2012  HUMERUS SURGERY Left 1998  rod insertion  KNEE ARTHROSCOPY  03/2004; 06/2009  "right; left" Dr. Theda Sers  KNEE SURGERY  2012  ARTHROSCOPIC LEFT KNEE  LYMPH NODE BIOPSY  ~ 2009  "for sarcoidosis; don't know exactly which nodes" (07/23/2102)  [facility] [place] [facility]  Open  POLYPECTOMY  2020  TA's  REFRACTIVE SURGERY  08/1998  "both eyes" (07/23/2012)  [place]  Bil  TOTAL KNEE ARTHROPLASTY Left 09/24/2016  Procedure: TOTAL KNEE ARTHROPLASTY;   Surgeon: Dorna Leitz, MD;  Location: [place];  Service: Orthopedics;  Laterality: Left; HPI: Pt is a 71 year old female admitted from SNF with severe sepsis and respiratory failure due to PNA. CXR shows hazy airspace opacity at both lung bases, left greater than right, suspicious for pneumonia or aspiration pneumonitis. PMH includes: tardive dyskinesia (started tx in August through [facility] movement clinic), CKD, depression, sarcoidosis, dyslipidemia, and hypertension.  Subjective: awake, alert, cooperative  Recommendations for follow up therapy are one component of a multi-disciplinary discharge planning process, led by the attending physician.  Recommendations may be updated based on patient status, additional functional criteria and insurance authorization. Assessment / Plan / Recommendation   06/19/2022   3:13 PM Clinical Impressions Clinical Impression Patient presents with a severe oropharyngeal dysphagia as per this MBS. Barium consistencies tested included: honey thick, nectar thick and thin liquids. Patient exhibited oral transit delays, piecemeal swallowing and moderate amount of oral residuals post initial swallows with each consistency. Thin liquids via single straw sip were assessed first and patient exhibited swallow initiation delay at level of pyriform sinus and moderate amount of sensed aspiration which occured during the swallow. Patient grimacing when coughing and did appear to become anxious during coughing response. When coughing had subsided, SLP did assess her toleration with spoon sip of nectar thick liquids and a spoon sip of honey  thick liquids. No penetration or aspiration with either of these consistencies, however they did result in moderate amount of barium residuals in vallecular sinus, pyriform sinus, posterior pharyngeal wall and just generally throughout pharynx. Presence of nasogastric feeding tube did appear to impact barium transit in esophagus but this is not likely the cause of  her aspiration event and significant pharyngeal residuals. SLP is recommending continue NPO and allow PRN ice chips/water sips. SLP Visit Diagnosis Dysphagia, oropharyngeal phase (R13.12) Impact on safety and function Severe aspiration risk;Risk for inadequate nutrition/hydration     06/19/2022   3:13 PM Treatment Recommendations Treatment Recommendations Therapy as outlined in treatment plan below     06/19/2022   3:18 PM Prognosis Prognosis for Safe Diet Advancement Fair Barriers to Reach Goals Severity of deficits   06/19/2022   3:13 PM Diet Recommendations SLP Diet Recommendations NPO Medication Administration Via alternative means     06/19/2022   3:13 PM Other Recommendations Oral Care Recommendations Oral care QID;Staff/trained caregiver to provide oral care Follow Up Recommendations Skilled nursing-short term rehab (<3 hours/day) Functional Status Assessment Patient has had a recent decline in their functional status and demonstrates the ability to make significant improvements in function in a reasonable and predictable amount of time.   06/19/2022   3:13 PM Frequency and Duration  Speech Therapy Frequency (ACUTE ONLY) min 2x/week Treatment Duration 2 weeks     06/19/2022   3:07 PM Oral Phase Oral Phase Impaired Oral - Honey Teaspoon Weak lingual manipulation;Reduced posterior propulsion;Piecemeal swallowing;Decreased bolus cohesion;Delayed oral transit;Lingual/palatal residue Oral - Nectar Teaspoon Delayed oral transit;Lingual/palatal residue;Weak lingual manipulation;Reduced posterior propulsion;Piecemeal swallowing Oral - Thin Straw Piecemeal swallowing;Premature spillage;Decreased bolus cohesion;Weak lingual manipulation;Reduced posterior propulsion    06/19/2022   3:12 PM Pharyngeal Phase Pharyngeal Material enters airway, passes BELOW cords and not ejected out despite cough attempt by patient    06/19/2022   3:09 PM Cervical Esophageal Phase  Cervical Esophageal Phase Impaired Cervical Esophageal Comment  presence of nasogastric feeding tube did appear to restrict barium transit Tara Baller, MA, CCC-SLP Speech Therapy                     CT CHEST ABDOMEN PELVIS W CONTRAST  Result Date: 06/15/2022 CLINICAL DATA:  Concern for occult malignancy, profound weight loss. History of sarcoidosis. * Tracking Code: BO * EXAM: CT CHEST, ABDOMEN, AND PELVIS WITH CONTRAST TECHNIQUE: Multidetector CT imaging of the chest, abdomen and pelvis was performed following the standard protocol during bolus administration of intravenous contrast. RADIATION DOSE REDUCTION: This exam was performed according to the departmental dose-optimization program which includes automated exposure control, adjustment of the mA and/or kV according to patient size and/or use of iterative reconstruction technique. CONTRAST:  25m OMNIPAQUE IOHEXOL 350 MG/ML SOLN COMPARISON:  CT chest August 21, 2018. FINDINGS: CT CHEST FINDINGS Cardiovascular: Aortic atherosclerosis. No central pulmonary embolus on this nondedicated study. Mild cardiac enlargement. No significant pericardial effusion/thickening. Mediastinum/Nodes: Hypodense 12 mm nodule in the left lobe of the thyroid Not clinically significant; no follow-up imaging recommended (ref: J Am Coll Radiol. 2015 Feb;12(2): 143-50).No pathologically enlarged mediastinal, hilar or axillary lymph nodes. Single prominent left axillary lymph node measures 6 mm in short axis on image 16/3 previously 8 mm on CT August 21, 2018. Enteric tube in the esophagus with tip in the stomach. Lungs/Pleura: Cluster of tiny nodules measure up to 3 mm pulmonary nodule in the left lung apex on image 24/4 is unchanged dating back to CT August 21, 2018. New 4 mm left lower lobe ground-glass pulmonary nodule on image 64/4. No additional pulmonary nodule identified on this motion degraded examination of the lungs. No pleural effusion. No pneumothorax. Musculoskeletal: Left humeral fixation hardware. Multilevel degenerative  changes spine. No acute abnormality. CT ABDOMEN PELVIS FINDINGS Hepatobiliary: No suspicious hepatic lesion. Gallbladder is distended with some mild wall thickening. No biliary ductal dilation. No biliary ductal dilation. Pancreas: No pancreatic ductal dilation or evidence of acute inflammation. Spleen: No splenomegaly or focal splenic lesion. Adrenals/Urinary Tract: Lobular thickening of the left adrenal gland without discrete nodularity likely reflects hyperplasia/small adenomas and is considered benign requiring no independent imaging follow-up. Right adrenal gland appears normal. No hydronephrosis. Relative atrophy of the left kidney with lobular renal contour. Symmetric enhancement excretion of contrast in the bilateral kidneys. Urinary bladder is unremarkable for degree of distension. Stomach/Bowel: Enteric tube tip in the stomach. No pathologic dilation of small or large bowel. Appendix is not confidently identified however there is no pericecal inflammation. Moderate volume of formed stool in the colon with a large rectal stool ball rectal wall thickening and stranding in the perirectal fat. Colonic diverticulosis without findings of acute diverticulitis. Vascular/Lymphatic: Aortic atherosclerosis. No pathologically enlarged abdominal or pelvic lymph nodes. Reproductive: Gas in the vagina suggest correlation with recent instrumentation. No suspicious uterine or adnexal lesion. Other: No significant abdominopelvic free fluid. Mild diffuse body wall edema. Musculoskeletal: No aggressive lytic or blastic lesion of bone. Multilevel degenerative changes spine. Degenerative changes bilateral hips. IMPRESSION: 1. No convincing evidence of primary malignancy or metastatic disease within the chest, abdomen or pelvis. 2. New 4 mm left lower lobe ground-glass pulmonary nodule, favored infectious or inflammatory. No follow-up recommended. This recommendation follows the consensus statement: Guidelines for Management of  Incidental Pulmonary Nodules Detected on CT Images: From the Fleischner Society 2017; Radiology 2017; 284:228-243. 3. Gallbladder is distended with some mild wall thickening. If there is clinical concern for acute cholecystitis consider further evaluation with nuclear medicine HIDA scan. 4. Moderate volume of formed stool in the colon with a large rectal stool ball and rectal wall thickening and stranding in the perirectal fat, findings which can be seen in the setting of stercoral colitis. 5. Colonic diverticulosis without findings of acute diverticulitis. 6. Gas in the vagina suggest correlation with recent instrumentation. 7.  Aortic Atherosclerosis (ICD10-I70.0). Electronically Signed   By: Dahlia Bailiff M.D.   On: 06/15/2022 13:00   MR BRAIN W CONTRAST  Result Date: 06/15/2022 CLINICAL DATA:  Headache.  History of sarcoidosis. EXAM: MRI HEAD WITH CONTRAST TECHNIQUE: Multiplanar, multiecho pulse sequences of the brain and surrounding structures were obtained with intravenous contrast. CONTRAST:  7.66m GADAVIST GADOBUTROL 1 MMOL/ML IV SOLN COMPARISON:  Brain MRI without contrast 06/13/2022 FINDINGS: There is no abnormal contrast enhancement within the brain. No abnormal calvarial or orbital enhancement. Specifically, there is no pachymeningeal enhancement as may be seen with intracranial hypotension. IMPRESSION: No pachymeningeal contrast enhancement or other postcontrast abnormality. Electronically Signed   By: KUlyses JarredM.D.   On: 06/15/2022 00:50   MR CERVICAL SPINE W WO CONTRAST  Result Date: 06/14/2022 CLINICAL DATA:  CSF leak suspected. EXAM: MRI CERVICAL SPINE WITHOUT AND WITH CONTRAST TECHNIQUE: Multiplanar and multiecho pulse sequences of the cervical spine, to include the craniocervical junction and cervicothoracic junction, were obtained without and with intravenous contrast. CONTRAST:  7.574mGADAVIST GADOBUTROL 1 MMOL/ML IV SOLN COMPARISON:  None Available. FINDINGS: Alignment:  Physiologic. Vertebrae: No fracture, evidence of discitis, or bone lesion. Cord:  Normal signal and morphology. Posterior Fossa, vertebral arteries, paraspinal tissues: Negative. Disc levels: C1-2: Unremarkable. C2-3: Normal disc space and facet joints. There is no spinal canal stenosis. No neural foraminal stenosis. C3-4: Mild left facet hypertrophy. There is no spinal canal stenosis. No neural foraminal stenosis. C4-5: Normal disc space and facet joints. There is no spinal canal stenosis. No neural foraminal stenosis. C5-6: Mild right facet hypertrophy. There is no spinal canal stenosis. No neural foraminal stenosis. C6-7: Normal disc space and facet joints. There is no spinal canal stenosis. No neural foraminal stenosis. C7-T1: Small central disc protrusion. There is no spinal canal stenosis. No neural foraminal stenosis. IMPRESSION: 1. No acute abnormality of the cervical spine. 2. Mild cervical degenerative disc disease without spinal canal or neural foraminal stenosis. Electronically Signed   By: Ulyses Jarred M.D.   On: 06/14/2022 23:49   MR BRAIN WO CONTRAST  Result Date: 06/13/2022 CLINICAL DATA:  Rule out stroke. EXAM: MRI HEAD WITHOUT CONTRAST TECHNIQUE: Multiplanar, multiecho pulse sequences of the brain and surrounding structures were obtained without intravenous contrast. COMPARISON:  CT Brain 06/04/22 FINDINGS: Brain: No acute infarction, hemorrhage, hydrocephalus, extra-axial collection or mass lesion.Sequela of mild-to-moderate chronic microvascular ischemic change. T2/FLAIR hyperintense signal is also seen within the central pons, favored to represent sequela of chronic microvascular ischemic change. Vascular: Normal flow voids. Skull and upper cervical spine: Normal marrow signal. Sinuses/Orbits: Bilateral lens replacement. Mild mucosal thickening bilateral anterior ethmoid air cells. Other: None. IMPRESSION: No acute intracranial abnormality. Electronically Signed   By: Marin Roberts M.D.    On: 06/13/2022 16:34   DG Abd Portable 1V  Result Date: 06/06/2022 CLINICAL DATA:  Encounter for feeding tube placement. EXAM: PORTABLE ABDOMEN - 1 VIEW COMPARISON:  None Available. FINDINGS: The bowel gas pattern is normal. Weighted tip feeding tube with distal tip projecting over the pyloric region. No radio-opaque calculi or other significant radiographic abnormality are seen. IMPRESSION: Weighted tip feeding tube with distal tip projecting over the pyloric region. Electronically Signed   By: Keane Police D.O.   On: 06/06/2022 12:09   ECHOCARDIOGRAM COMPLETE  Result Date: 06/05/2022    ECHOCARDIOGRAM REPORT   Patient Name:   KATTIE SANTOYO Date of Exam: 06/05/2022 Medical Rec #:  [id number]     Height:       66.0 in Accession #:    [id number]    Weight:       170.0 lb Date of Birth:  [birth year]     BSA:          1.866 m Patient Age:    24 years      BP:           106/72 mmHg Patient Gender: F             HR:           68 bpm. Exam Location:  Inpatient Procedure: 2D Echo, Cardiac Doppler and Color Doppler Indications:    Elevated Troponin  History:        Patient has prior history of Echocardiogram examinations, most                 recent 01/11/2022. Arrythmias:Atrial Fibrillation; Risk                 Factors:Hypertension, Sleep Apnea and Dyslipidemia. CKD (chronic                 kidney disease) stage 4, GFR 15-29 ml/min.  Sonographer:    Ronny Flurry  Referring Phys: [place]  1. Left ventricular ejection fraction, by estimation, is 55%. The left ventricle has normal function. The left ventricle has no regional wall motion abnormalities. There is mild asymmetric left ventricular hypertrophy. Left ventricular diastolic parameters are indeterminate.  2. Right ventricular systolic function is normal. The right ventricular size is normal.  3. Left atrial size was moderately dilated.  4. Right atrial size was moderately dilated.  5. The mitral valve is normal in structure. Trivial  mitral valve regurgitation. No evidence of mitral stenosis.  6. The aortic valve is tricuspid. Aortic valve regurgitation is trivial. Aortic valve sclerosis/calcification is present, without any evidence of aortic stenosis. Comparison(s): No significant change from prior study. FINDINGS  Left Ventricle: Left ventricular ejection fraction, by estimation, is 55%. The left ventricle has normal function. The left ventricle has no regional wall motion abnormalities. The left ventricular internal cavity size was normal in size. There is mild asymmetric left ventricular hypertrophy. Left ventricular diastolic parameters are indeterminate. Right Ventricle: The right ventricular size is normal. Right vetricular wall thickness was not well visualized. Right ventricular systolic function is normal. Left Atrium: Left atrial size was moderately dilated. Right Atrium: Right atrial size was moderately dilated. Pericardium: There is no evidence of pericardial effusion. Mitral Valve: The mitral valve is normal in structure. Trivial mitral valve regurgitation. No evidence of mitral valve stenosis. Tricuspid Valve: The tricuspid valve is normal in structure. Tricuspid valve regurgitation is not demonstrated. No evidence of tricuspid stenosis. Aortic Valve: The aortic valve is tricuspid. Aortic valve regurgitation is trivial. Aortic valve sclerosis/calcification is present, without any evidence of aortic stenosis. Aortic valve mean gradient measures 4.0 mmHg. Aortic valve peak gradient measures 7.1 mmHg. Aortic valve area, by VTI measures 1.91 cm. Pulmonic Valve: The pulmonic valve was not well visualized. Pulmonic valve regurgitation is not visualized. No evidence of pulmonic stenosis. Aorta: The ascending aorta was not well visualized and the aortic root is normal in size and structure. IAS/Shunts: No atrial level shunt detected by color flow Doppler.  LEFT VENTRICLE PLAX 2D LVIDd:         4.00 cm   Diastology LVIDs:         3.00 cm    LV e' medial:    10.30 cm/s LV PW:         1.00 cm   LV E/e' medial:  9.4 LV IVS:        1.30 cm   LV e' lateral:   12.60 cm/s LVOT diam:     1.80 cm   LV E/e' lateral: 7.7 LV SV:         49 LV SV Index:   26 LVOT Area:     2.54 cm  RIGHT VENTRICLE             IVC RV S prime:     10.60 cm/s  IVC diam: 1.70 cm TAPSE (M-mode): 1.9 cm LEFT ATRIUM             Index        RIGHT ATRIUM           Index LA diam:        3.80 cm 2.04 cm/m   RA Area:     13.45 cm LA Vol (A2C):   54.9 ml 29.42 ml/m  RA Volume:   25.45 ml  13.64 ml/m LA Vol (A4C):   56.0 ml 30.01 ml/m LA Biplane Vol: 56.5 ml 30.27 ml/m  AORTIC VALVE  AV Area (Vmax):    1.96 cm AV Area (Vmean):   1.84 cm AV Area (VTI):     1.91 cm AV Vmax:           133.33 cm/s AV Vmean:          91.667 cm/s AV VTI:            0.255 m AV Peak Grad:      7.1 mmHg AV Mean Grad:      4.0 mmHg LVOT Vmax:         102.70 cm/s LVOT Vmean:        66.200 cm/s LVOT VTI:          0.192 m LVOT/AV VTI ratio: 0.75  AORTA Ao Root diam: 2.70 cm Ao Asc diam:  2.50 cm MV E velocity: 96.80 cm/s  TRICUSPID VALVE                            TR Peak grad:   23.6 mmHg                            TR Vmax:        243.00 cm/s                             SHUNTS                            Systemic VTI:  0.19 m                            Systemic Diam: 1.80 cm Rudean Haskell MD Electronically signed by Rudean Haskell MD Signature Date/Time: 06/05/2022/1:39:04 PM    Final    CT Head Wo Contrast  Result Date: 06/04/2022 CLINICAL DATA:  Delirium EXAM: CT HEAD WITHOUT CONTRAST TECHNIQUE: Contiguous axial images were obtained from the base of the skull through the vertex without intravenous contrast. RADIATION DOSE REDUCTION: This exam was performed according to the departmental dose-optimization program which includes automated exposure control, adjustment of the mA and/or kV according to patient size and/or use of iterative reconstruction technique. COMPARISON:  Head CT 04/01/2022.   MRI brain 04/02/2022. FINDINGS: Brain: No evidence of acute infarction, hemorrhage, hydrocephalus, extra-axial collection or mass lesion/mass effect. Vascular: No hyperdense vessel or unexpected calcification. Skull: Normal. Negative for fracture or focal lesion. Sinuses/Orbits: No acute finding. Other: None. IMPRESSION: No acute intracranial pathology. Electronically Signed   By: Ronney Asters M.D.   On: 06/04/2022 20:32   DG Chest Port 1 View  Result Date: 06/04/2022 CLINICAL DATA:  Sepsis EXAM: PORTABLE CHEST 1 VIEW COMPARISON:  04/01/2022 FINDINGS: Hazy airspace opacity of both lung bases, left greater than right. Mild cardiomegaly. Mediastinal contour unremarkable. No blunting of the costophrenic angles. Degenerative glenohumeral arthropathy bilaterally. IMPRESSION: 1. Hazy airspace opacity at both lung bases, left greater than right, suspicious for pneumonia or aspiration pneumonitis. 2. Mild cardiomegaly. 3. Degenerative glenohumeral arthropathy bilaterally. Electronically Signed   By: Van Clines M.D.   On: 06/04/2022 18:30    Labs:  CBC: Recent Labs    06/16/22 0202 06/17/22 0155 06/18/22 0827 06/20/22 0402  WBC 7.4 10.1 7.3 17.8*  HGB 12.1 12.0 12.5 13.2  HCT 37.0 37.0 38.4 40.6  PLT 242 260 259 339  COAGS: Recent Labs    06/04/22 1703 06/05/22 1346 06/07/22 0442 06/08/22 0229 06/08/22 1725 06/18/22 0827  INR 1.6*  --   --   --   --   --   APTT 34   < > 73* 62* 94* 62*   < > = values in this interval not displayed.    BMP: Recent Labs    06/16/22 0202 06/17/22 0155 06/18/22 0827 06/20/22 0402  NA 134* 138 137 134*  K 4.5 4.3 4.1 4.9  CL 99 100 102 97*  CO2 _0 GLUCOSE 122* 144* 164* 231*  BUN 22 22 25* 36*  CALCIUM 10.4* 10.6* 10.1 10.5*  CREATININE 0.89 0.98 0.90 1.32*  GFRNONAA >60 >60 >60 44*    LIVER FUNCTION TESTS: Recent Labs    04/22/22 1943 06/04/22 1830 06/06/22 0644 06/07/22 0442  BILITOT 0.8 1.0 0.9 0.7  AST 33 61*  46* 37  ALT 37 58* 54* 45*  ALKPHOS 90 74 63 75  PROT 7.0 6.3* 5.9* 6.0*  ALBUMIN 3.9 3.2* 2.7* 2.8*    TUMOR MARKERS: No results for input(s): "AFPTM", "CEA", "CA199", "CHROMGRNA" in the last 8760 hours.  Assessment and Plan:  Severe dysphagia Aspiration risk Deconditioning Scheduled for percutaneous gastric tube placement in IR 12/8 (Need off Eliquis 48 hrs- LD 12/6) Risks and benefits image guided gastrostomy tube placement was discussed with the patient's sister Tara [name] via phone including, but not limited to the need for a barium enema during the procedure, bleeding, infection, peritonitis and/or damage to adjacent structures.  All  questions were answered, sister Tara [name] is agreeable to proceed.  Consent signed and in chart.    Thank you for this interesting consult.  I greatly enjoyed meeting Brailynn Breth and look forward to participating in their care.  A copy of this report was sent to the requesting provider on this date.  Electronically Signed: Lavonia Drafts, PA-C 06/20/2022, 1:35 PM   I spent a total of 40 Minutes    in face to face in clinical consultation, greater than 50% of which was counseling/coordinating care for percutaneous gastric tube placement

## 2022-06-20 NOTE — Progress Notes (Addendum)
PROGRESS NOTE        PATIENT DETAILS Name: Tara [name] Age: 71 y.o. Sex: female Date of Birth: 18-Sep-1950 Admit Date: 06/04/2022 Admitting Physician Quintella Baton, MD [facility]:[id number], Hunt Oris, MD  Brief Summary: Patient is a 71 y.o.  female with history of sarcoidosis, tardive dyskinesia, CKD stage IIIa, PAF, HTN, HLD, OSA who presented to the hospital on 11/20 with acute hypoxic respiratory failure due to aspiration PNA in the setting of worsening dysphagia/tardive dyskinesia.  She was also found to have multiple cranial nerve deficits-consistent with neurosarcoidosis and started on high-dose steroids.  Significant events: 11/20>> admit to [facility]-hypoxia-aspiration pneumonia. 12/04>> started on high-dose Solu-Medrol for cranial nerve deficits/secondary to neurosarcoid.  Significant studies: 11/20>> CXR: Left> right PNA 11/20>> CT head: No acute intracranial abnormality 11/21>> echo: EF 55%. 11/29>> MRI brain wo contrast: No acute intracranial abnormality 11/30>> MRI C-spine: No acute abnormality 11/30>> MRI brain w contrast: No pachymeningeal contrast-enhancement 12/01>> CT chest/abdomen/pelvis: No evidence of primary malignancy/metastatic disease  Significant microbiology data: 11/20>> blood culture: No growth 11/20>> urine culture: Multiple species 11/20>> COVID/influenza PCR: Negative  Procedures: 11/22>>Cortrak tube placement  Consults: Neuro ID  Subjective: Soft voice-answers questions very slowly Did not see any obvious tardive movements with the tongue  Objective: Vitals: Blood pressure (!) 141/88, pulse 91, temperature (!) 97.5 F (36.4 C), temperature source Oral, resp. rate 17, height _0  (1.676 m), weight 76.1 kg, SpO2 95 %.   Exam: Gen Exam:Alert awake-not in any distress HEENT:atraumatic, normocephalic Chest: B/L clear to auscultation anteriorly CVS:S1S2 regular Abdomen:soft non tender, non distended Extremities:no  edema Neurology: Non focal Skin: no rash  Pertinent Labs/Radiology:    Latest Ref Rng & Units 06/20/2022    4:02 AM 06/18/2022    8:27 AM 06/17/2022    1:55 AM  CBC  WBC 4.0 - 10.5 K/uL 17.8  7.3  10.1   Hemoglobin 12.0 - 15.0 g/dL 13.2  12.5  12.0   Hematocrit 36.0 - 46.0 % 40.6  38.4  37.0   Platelets 150 - 400 K/uL 339  259  260     Lab Results  Component Value Date   NA 134 (L) 06/20/2022   K 4.9 06/20/2022   CL 97 (L) 06/20/2022   CO2 24 06/20/2022      Assessment/Plan: Severe sepsis with acute hypoxic respiratory failure due to aspiration PNA Sepsis physiology has resolved Completed a course of antimicrobial therapy All cultures remain negative Unfortunately continues to have severe dysphagia- see below  Oropharyngeal dysphagia Felt to be due to tardive dyskinesia flare-possible multiple cranial nerve involvement from neurosarcoidosis flare No improvement inspite of improvement in tardive dyskinesia-and 2-3 days of steroids. Currently has a Cortrak tube since 11/22-suspect will require a peg tube over the next few days (if no improvement on steroids).  Last dose of Eliquis on 12/5-now on therapeutic Lovenox.  Neurosarcoidosis flare With multiple cranial nerve deficits On day 3 of IV Solu-Medrol See above regarding dysphagia Await further input from neurology  Tardive dyskinesia flare Followed by movement clinic at [place] to be worse after stopping benzos-now somewhat improved after restarting Continue Tranxene/Ingrezza  AKI on CKD stage IIIa AKI hemodynamically mediated Improved creatinine back to baseline  Transaminitis Improved Likely related to sepsis Follow LFTs periodically  Minimally elevated troponin Likely demand ischemia Echo with stable EF  Persistent Afib Remains in  Afib >48 hours Rate controlled Continue Cardizem/flecainide/therapeutic Lovenox  HTN BP stable-continue hydralazine/Cardizem/Imdur  OSA Avoid CPAP given  significant issues with dysphagia/oral secretions  Left lower lobe groundglass 4 mm nodule Further workup deferred to the outpatient setting.  Nutrition Status: Nutrition Problem: Inadequate oral intake Etiology: inability to eat Signs/Symptoms: NPO status Interventions: Tube feeding  BMI: Estimated body mass index is 27.08 kg/m as calculated from the following:   Height as of this encounter: _0  (1.676 m).   Weight as of this encounter: 76.1 kg.   Code status:   Code Status: Full Code   DVT Prophylaxis: SCDs Start: 06/04/22 2221 Therapeutic Lovenox  Family Communication: None at bedside   Disposition Plan: Status is: Inpatient Remains inpatient appropriate because: Severity of illness.   Planned Discharge Destination:Skilled nursing facility   Diet: Diet Order             Diet NPO time specified  Diet effective now                     Antimicrobial agents: Anti-infectives (From admission, onward)    Start     Dose/Rate Route Frequency Ordered Stop   06/06/22 1730  ceFEPIme (MAXIPIME) 2 g in sodium chloride 0.9 % 100 mL IVPB        2 g 200 mL/hr over 30 Minutes Intravenous Every 12 hours 06/06/22 1627 06/09/22 1649   06/05/22 2200  vancomycin (VANCOREADY) IVPB 750 mg/150 mL  Status:  Discontinued        750 mg 150 mL/hr over 60 Minutes Intravenous Every 24 hours 06/04/22 2328 06/05/22 1019   06/04/22 2330  ceFEPIme (MAXIPIME) 2 g in sodium chloride 0.9 % 100 mL IVPB  Status:  Discontinued        2 g 200 mL/hr over 30 Minutes Intravenous Every 24 hours 06/04/22 2324 06/06/22 1627   06/04/22 2330  vancomycin (VANCOREADY) IVPB 1500 mg/300 mL        1,500 mg 150 mL/hr over 120 Minutes Intravenous  Once 06/04/22 2324 06/05/22 0233   06/04/22 2230  cefTRIAXone (ROCEPHIN) 2 g in sodium chloride 0.9 % 100 mL IVPB  Status:  Discontinued        2 g 200 mL/hr over 30 Minutes Intravenous Every 24 hours 06/04/22 2223 06/04/22 2224   06/04/22 2230  azithromycin  (ZITHROMAX) 500 mg in sodium chloride 0.9 % 250 mL IVPB  Status:  Discontinued        500 mg 250 mL/hr over 60 Minutes Intravenous Every 24 hours 06/04/22 2223 06/04/22 2224   06/04/22 1730  cefTRIAXone (ROCEPHIN) 2 g in sodium chloride 0.9 % 100 mL IVPB  Status:  Discontinued        2 g 200 mL/hr over 30 Minutes Intravenous Every 24 hours 06/04/22 1725 06/04/22 2321   06/04/22 1730  azithromycin (ZITHROMAX) 500 mg in sodium chloride 0.9 % 250 mL IVPB  Status:  Discontinued        500 mg 250 mL/hr over 60 Minutes Intravenous Every 24 hours 06/04/22 1725 06/04/22 2321        MEDICATIONS: Scheduled Meds:  brimonidine  1 drop Both Eyes BID   And   timolol  1 drop Both Eyes BID   cholecalciferol  1,000 Units Per Tube Daily   clorazepate  7.5 mg Per Tube BID   diltiazem  60 mg Per Tube Q8H   enoxaparin (LOVENOX) injection  75 mg Subcutaneous BID   feeding supplement (PROSource TF20)  60 mL Per Tube Daily   ferrous sulfate  300 mg Per Tube Q breakfast   flecainide  50 mg Per Tube BID   hydrALAZINE  100 mg Per Tube TID   insulin aspart  0-9 Units Subcutaneous Q4H   isosorbide dinitrate  10 mg Per Tube TID   latanoprost  1 drop Both Eyes QHS   multivitamin with minerals  1 tablet Per Tube Daily   mouth rinse  15 mL Mouth Rinse 4 times per day   pantoprazole (PROTONIX) IV  40 mg Intravenous Q24H   polyethylene glycol  17 g Oral BID   polyvinyl alcohol  1 drop Both Eyes TID   trimethoprim-polymyxin b  1 drop Both Eyes TID   valbenazine  40 mg Oral Daily   Continuous Infusions:  feeding supplement (JEVITY 1.2 CAL) 1,000 mL (06/20/22 0000)   methylPREDNISolone (SOLU-MEDROL) injection 1,000 mg (06/19/22 1001)   PRN Meds:.acetaminophen **OR** acetaminophen, ALPRAZolam, antiseptic oral rinse, melatonin, metoprolol tartrate, mouth rinse, polyvinyl alcohol, polyvinyl alcohol   I have personally reviewed following labs and imaging studies  LABORATORY DATA: CBC: Recent Labs  Lab  06/15/22 0507 06/16/22 0202 06/17/22 0155 06/18/22 0827 06/20/22 0402  WBC 10.1 7.4 10.1 7.3 17.8*  HGB 12.1 12.1 12.0 12.5 13.2  HCT 36.7 37.0 37.0 38.4 40.6  MCV 87.6 87.7 88.7 87.9 88.5  PLT 242 242 260 259 702    Basic Metabolic Panel: Recent Labs  Lab 06/15/22 0507 06/16/22 0202 06/17/22 0155 06/18/22 0827 06/20/22 0402  NA 134* 134* 138 137 134*  K 4.4 4.5 4.3 4.1 4.9  CL 99 99 100 102 97*  CO2 _0 GLUCOSE 147* 122* 144* 164* 231*  BUN 31* 22 22 25* 36*  CREATININE 1.00 0.89 0.98 0.90 1.32*  CALCIUM 10.4* 10.4* 10.6* 10.1 10.5*    GFR: Estimated Creatinine Clearance: 41.9 mL/min (A) (by C-G formula based on SCr of 1.32 mg/dL (H)).  Liver Function Tests: No results for input(s): "AST", "ALT", "ALKPHOS", "BILITOT", "PROT", "ALBUMIN" in the last 168 hours. No results for input(s): "LIPASE", "AMYLASE" in the last 168 hours. No results for input(s): "AMMONIA" in the last 168 hours.  Coagulation Profile: No results for input(s): "INR", "PROTIME" in the last 168 hours.  Cardiac Enzymes: Recent Labs  Lab 06/15/22 0507  CKTOTAL 21*    BNP (last 3 results) No results for input(s): "PROBNP" in the last 8760 hours.  Lipid Profile: No results for input(s): "CHOL", "HDL", "LDLCALC", "TRIG", "CHOLHDL", "LDLDIRECT" in the last 72 hours.  Thyroid Function Tests: No results for input(s): "TSH", "T4TOTAL", "FREET4", "T3FREE", "THYROIDAB" in the last 72 hours.  Anemia Panel: No results for input(s): "VITAMINB12", "FOLATE", "FERRITIN", "TIBC", "IRON", "RETICCTPCT" in the last 72 hours.  Urine analysis:    Component Value Date/Time   COLORURINE AMBER (A) 06/04/2022 2204   APPEARANCEUR HAZY (A) 06/04/2022 2204   LABSPEC 1.015 06/04/2022 2204   PHURINE 5.0 06/04/2022 2204   GLUCOSEU NEGATIVE 06/04/2022 2204   GLUCOSEU NEGATIVE 10/10/2021 1013   HGBUR NEGATIVE 06/04/2022 2204   BILIRUBINUR NEGATIVE 06/04/2022 2204   KETONESUR NEGATIVE 06/04/2022 2204    PROTEINUR 30 (A) 06/04/2022 2204   UROBILINOGEN 0.2 10/10/2021 1013   NITRITE POSITIVE (A) 06/04/2022 2204   LEUKOCYTESUR MODERATE (A) 06/04/2022 2204    Sepsis Labs: Lactic Acid, Venous    Component Value Date/Time   LATICACIDVEN 1.5 06/05/2022 0038    MICROBIOLOGY: No results found for this or any previous visit (from  the past 240 hour(s)).  RADIOLOGY STUDIES/RESULTS: DG Swallowing Func-Speech Pathology  Result Date: 06/19/2022 Table formatting from the original result was not included. Objective Swallowing Evaluation: Type of Study: MBS-Modified Barium Swallow Study  Patient Details Name: Deshawnda Acrey MRN: [id number] Date of Birth: 11-17-1950 Today's Date: 06/19/2022 Time: SLP Start Time (ACUTE ONLY): 2637 -SLP Stop Time (ACUTE ONLY): 8588 SLP Time Calculation (min) (ACUTE ONLY): 20 min Past Medical History: Past Medical History: Diagnosis Date  Anemia   on meds  ANKLE PAIN, LEFT 04/01/2008  ANXIETY 04/17/2007  on meds  Arthritis   generalized  Cataract   bilateral sx  Chronic kidney disease   stage 3  Colon polyp   COLONIC POLYPS, HX OF 08/01/2007  CONTUSIONS, MULTIPLE 04/01/2009  DEPRESSION 04/17/2007  on meds  DIZZINESS 08/01/2007  DYSPNEA 08/01/2007  with exertion  Enlargement of lymph nodes 08/13/2007  Excessive involuntary blinking   per pt,going on since 2018  Glaucoma   on meds  GLUCOSE INTOLERANCE 08/01/2007  Hypercalcemia due to sarcoidosis 2014  HYPERLIPIDEMIA 08/01/2007  no meds  HYPERSOMNIA 07/28/2008  HYPERTENSION 04/17/2007  Impaired glucose tolerance 03/23/2011  JOINT EFFUSION, LEFT KNEE 06/02/2010  Loose body in knee 04/01/2009  pt not sure?  Metabolic encephalopathy 50/27/7412-87/8676  Migraines   "stopped 3-4 yr ago" (07/23/2012)  Morbid obesity ([place]) 04/20/2007  OTHER DISEASES OF LUNG NOT ELSEWHERE CLASSIFIED 08/01/2007  Pain in joint, lower leg 04/01/2009  PERIPHERAL EDEMA 04/21/2009  Pre-diabetes   Sarcoidosis 09/25/2007  Seasonal allergies   SHOULDER PAIN, LEFT 04/01/2009   Sleep apnea   does not use Cpap Past Surgical History: Past Surgical History: Procedure Laterality Date  BREAST SURGERY    Biopsy benign/bil breaST  CARDIOVERSION N/A 12/15/2021  Procedure: CARDIOVERSION;  Surgeon: Skeet Latch, MD;  Location: [place];  Service: Cardiovascular;  Laterality: N/A;  CARDIOVERSION N/A 02/09/2022  Procedure: CARDIOVERSION;  Surgeon: [name], MD;  Location: [place];  Service: Cardiovascular;  Laterality: N/A;  CATARACT EXTRACTION    RIGHT EYE  COLONOSCOPY  2020  KN-MAC-suprep(good)-tics/TA's  COMBINED MEDIASTINOSCOPY AND BRONCHOSCOPY  08/2007  COMBINED MEDIASTINOSCOPY AND BRONCHOSCOPY  2009  Dental implant    FRACTURE SURGERY  ?02/1997  "left upper arm; put rod in" (07/23/2012)  GUM SURGERY  2000-?2009  "several ORs; soft tissue graft; took material from roof of mouth" (07/23/2012  HUMERUS SURGERY Left 1998  rod insertion  KNEE ARTHROSCOPY  03/2004; 06/2009  "right; left" Dr. Theda Sers  KNEE SURGERY  2012  ARTHROSCOPIC LEFT KNEE  LYMPH NODE BIOPSY  ~ 2009  "for sarcoidosis; don't know exactly which nodes" (07/23/2102)  [place]  Open  POLYPECTOMY  2020  TA's  REFRACTIVE SURGERY  08/1998  "both eyes" (07/23/2012)  [place]  Bil  TOTAL KNEE ARTHROPLASTY Left 09/24/2016  Procedure: TOTAL KNEE ARTHROPLASTY;  Surgeon: Dorna Leitz, MD;  Location: [place];  Service: Orthopedics;  Laterality: Left; HPI: Pt is a 71 year old female admitted from SNF with severe sepsis and respiratory failure due to PNA. CXR shows hazy airspace opacity at both lung bases, left greater than right, suspicious for pneumonia or aspiration pneumonitis. PMH includes: tardive dyskinesia (started tx in August through [facility]), CKD, depression, sarcoidosis, dyslipidemia, and hypertension.  Subjective: awake, alert, cooperative  Recommendations for follow up therapy are one component of a multi-disciplinary discharge planning process, led by the attending physician.   Recommendations may be updated based on patient status, additional functional criteria and insurance authorization. Assessment / Plan / Recommendation  06/19/2022   3:13 PM Clinical Impressions Clinical Impression Patient presents with a severe oropharyngeal dysphagia as per this MBS. Barium consistencies tested included: honey thick, nectar thick and thin liquids. Patient exhibited oral transit delays, piecemeal swallowing and moderate amount of oral residuals post initial swallows with each consistency. Thin liquids via single straw sip were assessed first and patient exhibited swallow initiation delay at level of pyriform sinus and moderate amount of sensed aspiration which occured during the swallow. Patient grimacing when coughing and did appear to become anxious during coughing response. When coughing had subsided, SLP did assess her toleration with spoon sip of nectar thick liquids and a spoon sip of honey thick liquids. No penetration or aspiration with either of these consistencies, however they did result in moderate amount of barium residuals in vallecular sinus, pyriform sinus, posterior pharyngeal wall and just generally throughout pharynx. Presence of nasogastric feeding tube did appear to impact barium transit in esophagus but this is not likely the cause of her aspiration event and significant pharyngeal residuals. SLP is recommending continue NPO and allow PRN ice chips/water sips. SLP Visit Diagnosis Dysphagia, oropharyngeal phase (R13.12) Impact on safety and function Severe aspiration risk;Risk for inadequate nutrition/hydration     06/19/2022   3:13 PM Treatment Recommendations Treatment Recommendations Therapy as outlined in treatment plan below     06/19/2022   3:18 PM Prognosis Prognosis for Safe Diet Advancement Fair Barriers to Reach Goals Severity of deficits   06/19/2022   3:13 PM Diet Recommendations SLP Diet Recommendations NPO Medication Administration Via alternative means     06/19/2022    3:13 PM Other Recommendations Oral Care Recommendations Oral care QID;Staff/trained caregiver to provide oral care Follow Up Recommendations Skilled nursing-short term rehab (<3 hours/day) Functional Status Assessment Patient has had a recent decline in their functional status and demonstrates the ability to make significant improvements in function in a reasonable and predictable amount of time.   06/19/2022   3:13 PM Frequency and Duration  Speech Therapy Frequency (ACUTE ONLY) min 2x/week Treatment Duration 2 weeks     06/19/2022   3:07 PM Oral Phase Oral Phase Impaired Oral - Honey Teaspoon Weak lingual manipulation;Reduced posterior propulsion;Piecemeal swallowing;Decreased bolus cohesion;Delayed oral transit;Lingual/palatal residue Oral - Nectar Teaspoon Delayed oral transit;Lingual/palatal residue;Weak lingual manipulation;Reduced posterior propulsion;Piecemeal swallowing Oral - Thin Straw Piecemeal swallowing;Premature spillage;Decreased bolus cohesion;Weak lingual manipulation;Reduced posterior propulsion    06/19/2022   3:12 PM Pharyngeal Phase Pharyngeal Material enters airway, passes BELOW cords and not ejected out despite cough attempt by patient    06/19/2022   3:09 PM Cervical Esophageal Phase  Cervical Esophageal Phase Impaired Cervical Esophageal Comment presence of nasogastric feeding tube did appear to restrict barium transit Sonia Baller, MA, CCC-SLP Speech Therapy                       LOS: 16 days   Oren Binet, MD  [facility]    To contact the attending provider between 7A-7P or the covering provider during after hours 7P-7A, please log into the web site [email or web address] and access using universal [place] password for that web site. If you do not have the password, please call the hospital operator.  06/20/2022, 9:29 AM

## 2022-06-20 NOTE — Progress Notes (Signed)
Nutrition Follow-up  DOCUMENTATION CODES:   Not applicable  INTERVENTION:   Tube Feeds via Cortrak:  Jevity 1.2 at 60 mL/hr (1440 mL/day) 60 mL ProSource TF20 - daily  160 mL free water q6h Provides 1808 kcal, 100 gm protein, and 1802 mL free water daily. Recommend placement of PEG tube due to NPO recommendation from SLP. If proceeding with PEG placement will transition to bolus feeds as able.   NUTRITION DIAGNOSIS:   Inadequate oral intake related to inability to eat as evidenced by NPO status. - Being addressed via TF  GOAL:   Patient will meet greater than or equal to 90% of their needs - Being met via TF  MONITOR:   Diet advancement, Labs, TF tolerance, I & O's, Weight trends  REASON FOR ASSESSMENT:   Consult Enteral/tube feeding initiation and management  ASSESSMENT:   71 y.o. female presented to the ED with decreased consciousness from SNF. PMH includes CKD IV, MDD, and HTN. Pt admitted with sepsis secondary to PNA, AKI on CKD, acute liver failure, and elevated troponin.  11/22 - Cortrak placed (tip pyloric region) 11/29 - diet advanced to full liquids  12/03 - NPO 12/05 - MBS, recommend NPO  Pt laying in bed, less interactive than previous RD visits. Shakes head to no when asked about nausea or vomiting.  RD reached out to MD about plan to proceed with PEG. MD informs that neurology would like to wait to see if steroids improve her mentation.   Medications reviewed and include: Vitamin D3, Ferrous Sulfate, NovoLog SSI, MVI, Protonix, Miralax, IV solu-medrol Labs reviewed: Sodium 134, BUN 36, Creatinine 1.32, 24 hr CBGs 188-247  Diet Order:   Diet Order             Diet NPO time specified  Diet effective now                  EDUCATION NEEDS:   Not appropriate for education at this time  Skin:  Skin Assessment: Reviewed RN Assessment  Last BM:  12/4  Height:  Ht Readings from Last 1 Encounters:  06/04/22 5' 6" (1.676 m)   Weight:  Wt  Readings from Last 1 Encounters:  06/20/22 76.1 kg   Ideal Body Weight:  59.1 kg  BMI:  Body mass index is 27.08 kg/m.  Estimated Nutritional Needs:  Kcal:  1700-1900 Protein:  85-100 grams Fluid:  >/= 1.7 L    Hermina Barters RD, LDN Clinical Dietitian See [facility] for contact information.

## 2022-06-20 NOTE — Progress Notes (Signed)
Neurology Progress Note  Brief HPI:  71 year old female nursing home resident with past medical history of CKD, depression, sarcoid, dyslipidemia, and hypertension.  On initial presentation patient was hypoxic with decreased responsiveness and found to have pneumonia as well. Her work up has been significant for sepsis due to to aspiration PNA,  as well she was noted to have dysphagia. Concern for neurosarcoidosis. Of note, she was recently started on Ingrezza for Tadive dyskinesia.   Subjective: -Has persistent impaired EOM  -States that she feels like her face is not working right and she does not feel like she is getting any better  Exam: Vitals:   06/19/22 2300 06/20/22 0000  BP:  (!) 141/88  Pulse: 90 91  Resp:    Temp:    SpO2: 96% 95%   Gen: In bed, comfortable  Resp: non-labored breathing, no grossly audible wheezing Eyes: Bilateral crusting and discharge Cardiac: Perfusing extremities well  Abd: soft, nt  Neuro: Awake, alert, fully oriented other than to the date (Reports Sunday 06/18/2022) -consistently off by couple of days CN: PERRL, EOM notable for intermittent bilateral incomplete eye adduction, impaired upgaze on R, impaired downgaze on L, states both sides of face feel funny but are equal, frequent blinking, R NLF flattening, tongue with fewer tardive movements. Motor: Remains at least 4/5 strength on motor testing, effort is challenging today  Coordination: no frank ataxia FNF Sensation intact to light touch throughout   Pertinent Labs:  Basic Metabolic Panel: Recent Labs  Lab 06/15/22 0507 06/16/22 0202 06/17/22 0155 06/18/22 0827 06/20/22 0402  NA 134* 134* 138 137 134*  K 4.4 4.5 4.3 4.1 4.9  CL 99 99 100 102 97*  CO2 '27 25 27 26 24  '$ GLUCOSE 147* 122* 144* 164* 231*  BUN 31* 22 22 25* 36*  CREATININE 1.00 0.89 0.98 0.90 1.32*  CALCIUM 10.4* 10.4* 10.6* 10.1 10.5*     CBC: Recent Labs  Lab 06/15/22 0507 06/16/22 0202 06/17/22 0155  06/18/22 0827 06/20/22 0402  WBC 10.1 7.4 10.1 7.3 17.8*  HGB 12.1 12.1 12.0 12.5 13.2  HCT 36.7 37.0 37.0 38.4 40.6  MCV 87.6 87.7 88.7 87.9 88.5  PLT 242 242 260 259 339     Coagulation Studies: No results for input(s): "LABPROT", "INR" in the last 72 hours.    Impression: Patient with known sarcoidosis admitted with worsening tardive dyskinesia after stopping benzo, now improved after restarting. Patient with multiple cranial nerve deficits c/f neurosarcoid. Recommend IVMP. No indication for LP given no pathognomonic CSF findings for neurosarcoid and no way to exclude neurologic involvement with CSF.  Recommendations: - Tranxene 2 times daily (8 AM, 8 PM)  - Eliquis is on hold in anticipation of PEG placement. Current on full dose lovenox for anticoagulation - Continue solumedrol 1g q 24 hrs x5 days  - Day one 06/18/2022 - Neurology will continue to follow  Patient seen and examined by NP/APP with MD. MD to update note as needed.   Janine Ores, DNP, FNP-BC [facility] Pager: [phone]   Attending Neurohospitalist Addendum Patient seen and examined with APP/Resident. Agree with the history and physical as documented above. Agree with the plan as documented, which I helped formulate. I have edited the note above to reflect my full findings and recommendations. I have independently reviewed the chart, obtained history, review of systems and examined the patient.I have personally reviewed pertinent head/neck/spine imaging (CT/MRI). Please feel free to call with any questions.  -- Su Monks, MD [facility] [phone]  If 7pm- 7am, please page neurology on call as listed in [place].

## 2022-06-20 NOTE — Inpatient Diabetes Management (Signed)
Inpatient Diabetes Program Recommendations  AACE/ADA: New Consensus Statement on Inpatient Glycemic Control (2015)  Target Ranges:  Prepandial:   less than 140 mg/dL      Peak postprandial:   less than 180 mg/dL (1-2 hours)      Critically ill patients:  140 - 180 mg/dL   Lab Results  Component Value Date   GLUCAP 188 (H) 06/20/2022   HGBA1C 5.7 (H) 06/12/2022    Review of Glycemic Control  Latest Reference Range & Units 06/19/22 08:21 06/19/22 12:28 06/19/22 17:05 06/19/22 20:20 06/19/22 23:37 06/20/22 03:57 06/20/22 08:30 06/20/22 12:18  Glucose-Capillary 70 - 99 mg/dL 233 (H) 213 (H) 225 (H) 247 (H) 232 (H) 240 (H) 217 (H) 188 (H)   From SNF/AMS Diabetes history: pre DM Outpatient Diabetes medications: None Current orders for Inpatient glycemic control:  Novolog 0-9 units Q4 hours  Solumedrol 66 ml/hour  Inpatient Diabetes Program Recommendations:    -  Add Novolog 3 units Q4 hours Tube Feed Coverage  Thanks,  Tama Headings RN, MSN, BC-ADM Inpatient Diabetes Coordinator Team Pager [phone] (8a-5p)

## 2022-06-20 NOTE — Plan of Care (Signed)

## 2022-06-21 ENCOUNTER — Inpatient Hospital Stay (HOSPITAL_COMMUNITY): Payer: BC Managed Care – PPO

## 2022-06-21 DIAGNOSIS — A419 Sepsis, unspecified organism: Secondary | ICD-10-CM | POA: Diagnosis not present

## 2022-06-21 DIAGNOSIS — I480 Paroxysmal atrial fibrillation: Secondary | ICD-10-CM | POA: Diagnosis not present

## 2022-06-21 DIAGNOSIS — G4733 Obstructive sleep apnea (adult) (pediatric): Secondary | ICD-10-CM | POA: Diagnosis not present

## 2022-06-21 DIAGNOSIS — J189 Pneumonia, unspecified organism: Secondary | ICD-10-CM | POA: Diagnosis not present

## 2022-06-21 LAB — GLUCOSE, CAPILLARY
Glucose-Capillary: 277 mg/dL — ABNORMAL HIGH (ref 70–99)
Glucose-Capillary: 250 mg/dL — ABNORMAL HIGH (ref 70–99)
Glucose-Capillary: 203 mg/dL — ABNORMAL HIGH (ref 70–99)
Glucose-Capillary: 230 mg/dL — ABNORMAL HIGH (ref 70–99)
Glucose-Capillary: 285 mg/dL — ABNORMAL HIGH (ref 70–99)

## 2022-06-21 LAB — CBC
HCT: 38.3 % (ref 36.0–46.0)
Hemoglobin: 12.7 g/dL (ref 12.0–15.0)
MCH: 29.4 pg (ref 26.0–34.0)
MCHC: 33.2 g/dL (ref 30.0–36.0)
MCV: 88.7 fL (ref 80.0–100.0)
Platelets: 291 10*3/uL (ref 150–400)
RBC: 4.32 MIL/uL (ref 3.87–5.11)
RDW: 17 % — ABNORMAL HIGH (ref 11.5–15.5)
WBC: 12.8 10*3/uL — ABNORMAL HIGH (ref 4.0–10.5)
nRBC: 0 % (ref 0.0–0.2)

## 2022-06-21 LAB — MISC LABCORP TEST (SEND OUT): Labcorp test code: 70115

## 2022-06-21 LAB — BASIC METABOLIC PANEL
Anion gap: 9 (ref 5–15)
BUN: 53 mg/dL — ABNORMAL HIGH (ref 8–23)
CO2: 27 mmol/L (ref 22–32)
Calcium: 10.6 mg/dL — ABNORMAL HIGH (ref 8.9–10.3)
Chloride: 103 mmol/L (ref 98–111)
Creatinine, Ser: 1.38 mg/dL — ABNORMAL HIGH (ref 0.44–1.00)
GFR, Estimated: 41 mL/min — ABNORMAL LOW (ref >60)
Glucose, Bld: 276 mg/dL — ABNORMAL HIGH (ref 70–99)
Potassium: 4.9 mmol/L (ref 3.5–5.1)
Sodium: 139 mmol/L (ref 135–145)

## 2022-06-21 MED ORDER — BARIUM SULFATE 2 % PO SUSP
450.0000 mL | Freq: Once | ORAL | Status: AC
  Administered 2022-06-21: 450 mL via ORAL

## 2022-06-21 MED ORDER — CARBAMAZEPINE ER 100 MG PO TB12
100.0000 mg | ORAL_TABLET | Freq: Two times a day (BID) | ORAL | Status: DC
  Administered 2022-06-21 – 2022-06-23 (×3): 100 mg via ORAL
  Filled 2022-06-21 (×5): qty 1

## 2022-06-21 MED ORDER — ENOXAPARIN SODIUM 80 MG/0.8ML IJ SOSY
75.0000 mg | PREFILLED_SYRINGE | Freq: Two times a day (BID) | INTRAMUSCULAR | Status: DC
  Administered 2022-06-23 – 2022-06-24 (×4): 75 mg via SUBCUTANEOUS
  Filled 2022-06-21 (×4): qty 0.8

## 2022-06-21 NOTE — Progress Notes (Signed)
Physical Therapy Treatment Patient Details Name: Tara [name] MRN: [id number] DOB: June 27, 1951 Today's Date: 06/21/2022   History of Present Illness 71 y.o. female admitted from [facility] SN[facility] with decreased responsiveness and not eating or drinking. Pt with multiple recent admission for weakness and falls. Patient recently had cardiology follow up, placed on zio patch on 03/26/22 for 2 weeks. Patient  since 9/11 has had 3 ED visit for non-intractable HA,last of which was 9/16 for which she was evaluated ,treated and discharged home in improve condition. PMH significant for CKD, sarcoidosis, HTN, obesity,OSA on CPAP, atrial fibrillation on Eliquis, hx of presyncope seen in ed 3 x since 8/29.    PT Comments    Pt with some tremors noted during session, states she feels "so so" when asked. Pt tolerated repeated stands with RW and stand pivot to reach recliner, no stedy needed. Pt also performed LE exercises well once in chair. Pt progressing well, will continue to follow. PT placed mobility specialist consult to increase pt OOB time and mobility.      Recommendations for follow up therapy are one component of a multi-disciplinary discharge planning process, led by the attending physician.  Recommendations may be updated based on patient status, additional functional criteria and insurance authorization.  Follow Up Recommendations  Skilled nursing-short term rehab (<3 hours/day) Can patient physically be transported by private vehicle: No   Assistance Recommended at Discharge Frequent or constant Supervision/Assistance  Patient can return home with the following Assist for transportation;A lot of help with bathing/dressing/bathroom;Help with stairs or ramp for entrance;A lot of help with walking and/or transfers   Equipment Recommendations  None recommended by PT    Recommendations for Other Services       Precautions / Restrictions Precautions Precautions: Fall;Other (comment) Precaution  Comments: watch HR and BP; cortrak Restrictions Weight Bearing Restrictions: No     Mobility  Bed Mobility Overal bed mobility: Needs Assistance Bed Mobility: Supine to Sit     Supine to sit: Mod assist, +2 for physical assistance     General bed mobility comments: mod +2 for trunk and LE management, scooting to EOB with assist of bed pad    Transfers Overall transfer level: Needs assistance Equipment used: Rolling walker (2 wheels) Transfers: Sit to/from Stand, Bed to chair/wheelchair/BSC Sit to Stand: Mod assist, +2 safety/equipment   Step pivot transfers: Mod assist, +2 safety/equipment       General transfer comment: assist for power up, rise, steady, and pivotal steps to reach recliner. STS x2 from EOB    Ambulation/Gait                   Stairs             Wheelchair Mobility    Modified Rankin (Stroke Patients Only)       Balance Overall balance assessment: Needs assistance Sitting-balance support: Feet unsupported, Bilateral upper extremity supported Sitting balance-Leahy Scale: Fair   Postural control: Posterior lean Standing balance support: Bilateral upper extremity supported, Reliant on assistive device for balance Standing balance-Leahy Scale: Poor                              Cognition Arousal/Alertness: Awake/alert Behavior During Therapy: Flat affect Overall Cognitive Status: No family/caregiver present to determine baseline cognitive functioning Area of Impairment: Attention, Memory, Safety/judgement, Awareness                   Current Attention  Level: Sustained   Following Commands: Follows one step commands consistently Safety/Judgement: Decreased awareness of deficits, Decreased awareness of safety   Problem Solving: Slow processing, Requires verbal cues, Requires tactile cues General Comments: slowed processing, inconsistently answering questions        Exercises General Exercises - Lower  Extremity Long Arc Quad: AROM, Both, 15 reps, Seated Hip Flexion/Marching: AROM, Both, 15 reps, Seated    General Comments        Pertinent Vitals/Pain Pain Assessment Pain Assessment: Faces Faces Pain Scale: Hurts little more Pain Location: "my face" Pain Descriptors / Indicators: Discomfort, Constant Pain Intervention(s): Limited activity within patient's tolerance, Monitored during session, Repositioned    Home Living                          Prior Function            PT Goals (current goals can now be found in the care plan section) Acute Rehab PT Goals Patient Stated Goal: to find new rehab facility PT Goal Formulation: With patient Time For Goal Achievement: 06/22/22 Potential to Achieve Goals: Fair Progress towards PT goals: Progressing toward goals    Frequency    Min 2X/week      PT Plan Current plan remains appropriate    Co-evaluation              AM-PAC PT "6 Clicks" Mobility   Outcome Measure  Help needed turning from your back to your side while in a flat bed without using bedrails?: A Lot Help needed moving from lying on your back to sitting on the side of a flat bed without using bedrails?: A Lot Help needed moving to and from a bed to a chair (including a wheelchair)?: A Lot Help needed standing up from a chair using your arms (e.g., wheelchair or bedside chair)?: A Lot Help needed to walk in hospital room?: Total Help needed climbing 3-5 steps with a railing? : Total 6 Click Score: 10    End of Session Equipment Utilized During Treatment: Gait belt Activity Tolerance: Patient limited by fatigue Patient left: in chair;with call bell/phone within reach;with chair alarm set Nurse Communication: Mobility status PT Visit Diagnosis: Other abnormalities of gait and mobility (R26.89);Muscle weakness (generalized) (M62.81)     Time: 6644-0347 PT Time Calculation (min) (ACUTE ONLY): 17 min  Charges:  $Therapeutic Activity: 8-22  mins                     Stacie Glaze, PT DPT Acute Rehabilitation Services Pager [phone]  Office [phone]    Roxine Caddy E Ruffin Pyo 06/21/2022, 5:08 PM

## 2022-06-21 NOTE — Progress Notes (Signed)
Speech Language Pathology Treatment: Dysphagia  Patient Details Name: Tara [name] MRN: [id number] DOB: [birth year] Today's Date: 06/21/2022 Time: 1050-1100 SLP Time Calculation (min) (ACUTE ONLY): 10 min  Assessment / Plan / Recommendation Clinical Impression  Patient seen by SLP for skilled therapy session focused on dysphagia goals. Patient was very lethargic and would open eyes briefly when cued. She did not attempt to verbalize but did nod head when SLP asked if she wanted oral care. SLP provided oral care via toothette suction sponges, Yankauer suction. Oral mucosa appeared very dry but only small amount of dried secretions on lips and trace to mild amount in oral cavity. Patient exhibited some weak coughing which was not productive. After oral care, patient was not able to maintain adequate alertness for safe PO trials. SLP continues to recommend NPO status but will follow patient for PO trials with prognosis guarded to fair at this point.     HPI HPI: Pt is a 71 year old female admitted from SNF with severe sepsis and respiratory failure due to PNA. CXR shows hazy airspace opacity at both lung bases, left greater than right, suspicious for pneumonia or aspiration pneumonitis. PMH includes: tardive dyskinesia (started tx in August through [facility] movement [facility]), CKD, depression, sarcoidosis, dyslipidemia, and hypertension.      SLP Plan  Continue with current plan of care      Recommendations for follow up therapy are one component of a multi-disciplinary discharge planning process, led by the attending physician.  Recommendations may be updated based on patient status, additional functional criteria and insurance authorization.    Recommendations  Diet recommendations: NPO Medication Administration: Via alternative means                Oral Care Recommendations: Oral care QID;Staff/trained caregiver to provide oral care Follow Up Recommendations: Skilled nursing-short term  rehab (<3 hours/day) Assistance recommended at discharge: Frequent or constant Supervision/Assistance SLP Visit Diagnosis: Dysphagia, oral phase (R13.11) Plan: Continue with current plan of care          Sonia Baller, MA, CCC-SLP Speech Therapy

## 2022-06-21 NOTE — Inpatient Diabetes Management (Addendum)
Inpatient Diabetes Program Recommendations  AACE/ADA: New Consensus Statement on Inpatient Glycemic Control (2015)  Target Ranges:  Prepandial:   less than 140 mg/dL      Peak postprandial:   less than 180 mg/dL (1-2 hours)      Critically ill patients:  140 - 180 mg/dL   Lab Results  Component Value Date   GLUCAP 250 (H) 06/21/2022   HGBA1C 5.7 (H) 06/12/2022    Review of Glycemic Control  Latest Reference Range & Units 06/20/22 08:30 06/20/22 12:18 06/20/22 15:55 06/20/22 20:24 06/20/22 23:48 06/21/22 04:05 06/21/22 07:52  Glucose-Capillary 70 - 99 mg/dL 217 (H) 188 (H) 197 (H) 255 (H) 276 (H) 277 (H) 250 (H)   Diabetes history: Pre DM Outpatient Diabetes medications: None Current orders for Inpatient glycemic control:  Novolog 0-9 units Q4 hours  Solumedrol 1000 mg Daily A1c 5.7% on 11/28 Jevity 60 ml/hour  Inpatient Diabetes Program Recommendations:    -  Add Novolog 5 units Q4 hours Tube Feed Coverage (do not give if Tube Feeds are stopped or held)  Thanks,  Tama Headings RN, MSN, BC-ADM Inpatient Diabetes Coordinator Team Pager [phone] (8a-5p)

## 2022-06-21 NOTE — Progress Notes (Signed)
PROGRESS NOTE        PATIENT DETAILS Name: Tara [name] Age: 71 y.o. Sex: female Date of Birth: 10-11-1950 Admit Date: 06/04/2022 Admitting Physician Quintella Baton, MD YOV:ZCHY, Hunt Oris, MD  Brief Summary: Patient is a 71 y.o.  female with history of sarcoidosis, tardive dyskinesia, CKD stage IIIa, PAF, HTN, HLD, OSA who presented to the hospital on 11/20 with acute hypoxic respiratory failure due to aspiration PNA in the setting of worsening dysphagia/tardive dyskinesia.  She was also found to have multiple cranial nerve deficits-consistent with neurosarcoidosis and started on high-dose steroids.  Significant events: 11/20>> admit to [facility]-hypoxia-aspiration pneumonia. 12/04>> started on high-dose Solu-Medrol for cranial nerve deficits/secondary to neurosarcoid.  Significant studies: 11/20>> CXR: Left> right PNA 11/20>> CT head: No acute intracranial abnormality 11/21>> echo: EF 55%. 11/29>> MRI brain wo contrast: No acute intracranial abnormality 11/30>> MRI C-spine: No acute abnormality 11/30>> MRI brain w contrast: No pachymeningeal contrast-enhancement 12/01>> CT chest/abdomen/pelvis: No evidence of primary malignancy/metastatic disease  Significant microbiology data: 11/20>> blood culture: No growth 11/20>> urine culture: Multiple species 11/20>> COVID/influenza PCR: Negative  Procedures: 11/22>>Cortrak tube placement  Consults: Neuro ID  Subjective: Much more awake/alert-able to talk to me in a clear tone today.  Complains of some facial pain.  Objective: Vitals: Blood pressure (!) 150/69, pulse 65, temperature (!) 97.4 F (36.3 C), temperature source Axillary, resp. rate 14, height _0  (1.676 m), weight 76.1 kg, SpO2 99 %.   Exam: Gen Exam:Alert awake-not in any distress HEENT:atraumatic, normocephalic Chest: B/L clear to auscultation anteriorly CVS:S1S2 regular Abdomen:soft non tender, non distended Extremities:no  edema Neurology: Non focal Skin: no rash  Pertinent Labs/Radiology:    Latest Ref Rng & Units 06/21/2022    7:23 AM 06/20/2022    4:02 AM 06/18/2022    8:27 AM  CBC  WBC 4.0 - 10.5 K/uL 12.8  17.8  7.3   Hemoglobin 12.0 - 15.0 g/dL 12.7  13.2  12.5   Hematocrit 36.0 - 46.0 % 38.3  40.6  38.4   Platelets 150 - 400 K/uL 291  339  259     Lab Results  Component Value Date   NA 139 06/21/2022   K 4.9 06/21/2022   CL 103 06/21/2022   CO2 27 06/21/2022      Assessment/Plan: Severe sepsis with acute hypoxic respiratory failure due to aspiration PNA Sepsis physiology has resolved Completed a course of antimicrobial therapy All cultures remain negative Unfortunately continues to have severe dysphagia- see below  Oropharyngeal dysphagia Feels to be due to flare of tardive dyskinesia and neurosarcoidosis.   No significant improvement after restarting Tranxene/Ingrezza for tardive dyskinesia and IV steroids for neurosarcoidosis.   PEG tube planned for 12/8  Currently has a Cortrak tube since 11/22  Neurosarcoidosis flare With multiple cranial nerve deficits On day 4 of IV Solu-Medrol See above regarding dysphagia Await further input from neurology  Tardive dyskinesia flare Followed by movement clinic at [place] to be worse after stopping benzos-now somewhat improved after restarting Continue Tranxene/Ingrezza  AKI on CKD stage IIIa AKI hemodynamically mediated Improved creatinine back to baseline  Transaminitis Improved Likely related to sepsis Follow LFTs periodically  Minimally elevated troponin Likely demand ischemia Echo with stable EF  Persistent Afib Remains in Afib >48 hours Rate controlled Continue Cardizem/flecainide/therapeutic Lovenox  HTN BP stable-continue hydralazine/Cardizem/Imdur  OSA Avoid  CPAP given significant issues with dysphagia/oral secretions  Left lower lobe groundglass 4 mm nodule Further workup deferred to the  outpatient setting.  Nutrition Status: Nutrition Problem: Inadequate oral intake Etiology: inability to eat Signs/Symptoms: NPO status Interventions: Tube feeding  BMI: Estimated body mass index is 27.08 kg/m as calculated from the following:   Height as of this encounter: _0  (1.676 m).   Weight as of this encounter: 76.1 kg.   Code status:   Code Status: Full Code   DVT Prophylaxis: SCDs Start: 06/04/22 2221 Therapeutic Lovenox  Family Communication: None at bedside   Disposition Plan: Status is: Inpatient Remains inpatient appropriate because: Severity of illness.   Planned Discharge Destination:Skilled nursing facility   Diet: Diet Order             Diet NPO time specified  Diet effective midnight           Diet NPO time specified  Diet effective now                     Antimicrobial agents: Anti-infectives (From admission, onward)    Start     Dose/Rate Route Frequency Ordered Stop   06/22/22 0600  ceFAZolin (ANCEF) IVPB 2g/100 mL premix        2 g 200 mL/hr over 30 Minutes Intravenous To Radiology 06/20/22 1400 06/23/22 0600   06/06/22 1730  ceFEPIme (MAXIPIME) 2 g in sodium chloride 0.9 % 100 mL IVPB        2 g 200 mL/hr over 30 Minutes Intravenous Every 12 hours 06/06/22 1627 06/09/22 1649   06/05/22 2200  vancomycin (VANCOREADY) IVPB 750 mg/150 mL  Status:  Discontinued        750 mg 150 mL/hr over 60 Minutes Intravenous Every 24 hours 06/04/22 2328 06/05/22 1019   06/04/22 2330  ceFEPIme (MAXIPIME) 2 g in sodium chloride 0.9 % 100 mL IVPB  Status:  Discontinued        2 g 200 mL/hr over 30 Minutes Intravenous Every 24 hours 06/04/22 2324 06/06/22 1627   06/04/22 2330  vancomycin (VANCOREADY) IVPB 1500 mg/300 mL        1,500 mg 150 mL/hr over 120 Minutes Intravenous  Once 06/04/22 2324 06/05/22 0233   06/04/22 2230  cefTRIAXone (ROCEPHIN) 2 g in sodium chloride 0.9 % 100 mL IVPB  Status:  Discontinued        2 g 200 mL/hr over 30 Minutes  Intravenous Every 24 hours 06/04/22 2223 06/04/22 2224   06/04/22 2230  azithromycin (ZITHROMAX) 500 mg in sodium chloride 0.9 % 250 mL IVPB  Status:  Discontinued        500 mg 250 mL/hr over 60 Minutes Intravenous Every 24 hours 06/04/22 2223 06/04/22 2224   06/04/22 1730  cefTRIAXone (ROCEPHIN) 2 g in sodium chloride 0.9 % 100 mL IVPB  Status:  Discontinued        2 g 200 mL/hr over 30 Minutes Intravenous Every 24 hours 06/04/22 1725 06/04/22 2321   06/04/22 1730  azithromycin (ZITHROMAX) 500 mg in sodium chloride 0.9 % 250 mL IVPB  Status:  Discontinued        500 mg 250 mL/hr over 60 Minutes Intravenous Every 24 hours 06/04/22 1725 06/04/22 2321        MEDICATIONS: Scheduled Meds:  brimonidine  1 drop Both Eyes BID   And   timolol  1 drop Both Eyes BID   cholecalciferol  1,000 Units Per Tube Daily  clorazepate  7.5 mg Per Tube BID   diltiazem  60 mg Per Tube Q8H   [START ON 06/23/2022] enoxaparin (LOVENOX) injection  75 mg Subcutaneous BID   feeding supplement (PROSource TF20)  60 mL Per Tube Daily   ferrous sulfate  300 mg Per Tube Q breakfast   flecainide  50 mg Per Tube BID   hydrALAZINE  100 mg Per Tube TID   insulin aspart  0-9 Units Subcutaneous Q4H   isosorbide dinitrate  10 mg Per Tube TID   latanoprost  1 drop Both Eyes QHS   multivitamin with minerals  1 tablet Per Tube Daily   mouth rinse  15 mL Mouth Rinse 4 times per day   pantoprazole (PROTONIX) IV  40 mg Intravenous Q24H   polyethylene glycol  17 g Oral BID   polyvinyl alcohol  1 drop Both Eyes TID   valbenazine  40 mg Oral Daily   Continuous Infusions:  [START ON 06/22/2022]  ceFAZolin (ANCEF) IV     feeding supplement (JEVITY 1.2 CAL) 1,000 mL (06/20/22 1846)   methylPREDNISolone (SOLU-MEDROL) injection 1,000 mg (06/21/22 0950)   PRN Meds:.acetaminophen **OR** acetaminophen, ALPRAZolam, antiseptic oral rinse, melatonin, metoprolol tartrate, mouth rinse, polyvinyl alcohol, polyvinyl alcohol   I have  personally reviewed following labs and imaging studies  LABORATORY DATA: CBC: Recent Labs  Lab 06/16/22 0202 06/17/22 0155 06/18/22 0827 06/20/22 0402 06/21/22 0723  WBC 7.4 10.1 7.3 17.8* 12.8*  HGB 12.1 12.0 12.5 13.2 12.7  HCT 37.0 37.0 38.4 40.6 38.3  MCV 87.7 88.7 87.9 88.5 88.7  PLT 242 260 259 339 291     Basic Metabolic Panel: Recent Labs  Lab 06/16/22 0202 06/17/22 0155 06/18/22 0827 06/20/22 0402 06/21/22 0723  NA 134* 138 137 134* 139  K 4.5 4.3 4.1 4.9 4.9  CL 99 100 102 97* 103  CO2 _0 GLUCOSE 122* 144* 164* 231* 276*  BUN 22 22 25* 36* 53*  CREATININE 0.89 0.98 0.90 1.32* 1.38*  CALCIUM 10.4* 10.6* 10.1 10.5* 10.6*     GFR: Estimated Creatinine Clearance: 40.1 mL/min (A) (by C-G formula based on SCr of 1.38 mg/dL (H)).  Liver Function Tests: No results for input(s): "AST", "ALT", "ALKPHOS", "BILITOT", "PROT", "ALBUMIN" in the last 168 hours. No results for input(s): "LIPASE", "AMYLASE" in the last 168 hours. No results for input(s): "AMMONIA" in the last 168 hours.  Coagulation Profile: No results for input(s): "INR", "PROTIME" in the last 168 hours.  Cardiac Enzymes: Recent Labs  Lab 06/15/22 0507  CKTOTAL 21*     BNP (last 3 results) No results for input(s): "PROBNP" in the last 8760 hours.  Lipid Profile: No results for input(s): "CHOL", "HDL", "LDLCALC", "TRIG", "CHOLHDL", "LDLDIRECT" in the last 72 hours.  Thyroid Function Tests: No results for input(s): "TSH", "T4TOTAL", "FREET4", "T3FREE", "THYROIDAB" in the last 72 hours.  Anemia Panel: No results for input(s): "VITAMINB12", "FOLATE", "FERRITIN", "TIBC", "IRON", "RETICCTPCT" in the last 72 hours.  Urine analysis:    Component Value Date/Time   COLORURINE AMBER (A) 06/04/2022 2204   APPEARANCEUR HAZY (A) 06/04/2022 2204   LABSPEC 1.015 06/04/2022 2204   PHURINE 5.0 06/04/2022 2204   GLUCOSEU NEGATIVE 06/04/2022 2204   GLUCOSEU NEGATIVE 10/10/2021 1013    HGBUR NEGATIVE 06/04/2022 [place] 06/04/2022 [place] 06/04/2022 2204   PROTEINUR 30 (A) 06/04/2022 2204   UROBILINOGEN 0.2 10/10/2021 1013   NITRITE POSITIVE (A) 06/04/2022 2204  LEUKOCYTESUR MODERATE (A) 06/04/2022 2204    Sepsis Labs: Lactic Acid, Venous    Component Value Date/Time   LATICACIDVEN 1.5 06/05/2022 0038    MICROBIOLOGY: No results found for this or any previous visit (from the past 240 hour(s)).  RADIOLOGY STUDIES/RESULTS: DG Swallowing Func-Speech Pathology  Result Date: 06/19/2022 Table formatting from the original result was not included. Objective Swallowing Evaluation: Type of Study: MBS-Modified Barium Swallow Study  Patient Details Name: Tara [name] MRN: [id number] Date of Birth: 1950/08/12 Today's Date: 06/19/2022 Time: SLP Start Time (ACUTE ONLY): 1607 -SLP Stop Time (ACUTE ONLY): 3710 SLP Time Calculation (min) (ACUTE ONLY): 20 min Past Medical History: Past Medical History: Diagnosis Date  Anemia   on meds  ANKLE PAIN, LEFT 04/01/2008  ANXIETY 04/17/2007  on meds  Arthritis   generalized  Cataract   bilateral sx  Chronic kidney disease   stage 3  Colon polyp   COLONIC POLYPS, HX OF 08/01/2007  CONTUSIONS, MULTIPLE 04/01/2009  DEPRESSION 04/17/2007  on meds  DIZZINESS 08/01/2007  DYSPNEA 08/01/2007  with exertion  Enlargement of lymph nodes 08/13/2007  Excessive involuntary blinking   per pt,going on since 2018  Glaucoma   on meds  GLUCOSE INTOLERANCE 08/01/2007  Hypercalcemia due to sarcoidosis 2014  HYPERLIPIDEMIA 08/01/2007  no meds  HYPERSOMNIA 07/28/2008  HYPERTENSION 04/17/2007  Impaired glucose tolerance 03/23/2011  JOINT EFFUSION, LEFT KNEE 06/02/2010  Loose body in knee 04/01/2009  pt not sure?  Metabolic encephalopathy 62/69/4854-62/7035  Migraines   "stopped 3-4 yr ago" (07/23/2012)  Morbid obesity ([place]) 04/20/2007  OTHER DISEASES OF LUNG NOT ELSEWHERE CLASSIFIED 08/01/2007  Pain in joint, lower leg 04/01/2009  PERIPHERAL  EDEMA 04/21/2009  Pre-diabetes   Sarcoidosis 09/25/2007  Seasonal allergies   SHOULDER PAIN, LEFT 04/01/2009  Sleep apnea   does not use Cpap Past Surgical History: Past Surgical History: Procedure Laterality Date  BREAST SURGERY    Biopsy benign/bil breaST  CARDIOVERSION N/A 12/15/2021  Procedure: CARDIOVERSION;  Surgeon: Skeet Latch, MD;  Location: [facility];  Service: Cardiovascular;  Laterality: N/A;  CARDIOVERSION N/A 02/09/2022  Procedure: CARDIOVERSION;  Surgeon: [name], MD;  Location: [facility];  Service: Cardiovascular;  Laterality: N/A;  CATARACT EXTRACTION    RIGHT EYE  COLONOSCOPY  2020  KN-MAC-suprep(good)-tics/TA's  COMBINED MEDIASTINOSCOPY AND BRONCHOSCOPY  08/2007  COMBINED MEDIASTINOSCOPY AND BRONCHOSCOPY  2009  Dental implant    FRACTURE SURGERY  ?02/1997  "left upper arm; put rod in" (07/23/2012)  GUM SURGERY  2000-?2009  "several ORs; soft tissue graft; took material from roof of mouth" (07/23/2012  HUMERUS SURGERY Left 1998  rod insertion  KNEE ARTHROSCOPY  03/2004; 06/2009  "right; left" Dr. Theda Sers  KNEE SURGERY  2012  ARTHROSCOPIC LEFT KNEE  LYMPH NODE BIOPSY  ~ 2009  "for sarcoidosis; don't know exactly which nodes" (07/23/2102)  [place]  Open  POLYPECTOMY  2020  TA's  REFRACTIVE SURGERY  08/1998  "both eyes" (07/23/2012)  [place]  Bil  TOTAL KNEE ARTHROPLASTY Left 09/24/2016  Procedure: TOTAL KNEE ARTHROPLASTY;  Surgeon: Dorna Leitz, MD;  Location: [facility];  Service: Orthopedics;  Laterality: Left; HPI: Pt is a 71 year old female admitted from SNF with severe sepsis and respiratory failure due to PNA. CXR shows hazy airspace opacity at both lung bases, left greater than right, suspicious for pneumonia or aspiration pneumonitis. PMH includes: tardive dyskinesia (started tx in August through [facility] movement [facility]), CKD, depression, sarcoidosis, dyslipidemia, and hypertension.  Subjective: awake, alert, cooperative  Recommendations for follow  up therapy are  one component of a multi-disciplinary discharge planning process, led by the attending physician.  Recommendations may be updated based on patient status, additional functional criteria and insurance authorization. Assessment / Plan / Recommendation   06/19/2022   3:13 PM Clinical Impressions Clinical Impression Patient presents with a severe oropharyngeal dysphagia as per this MBS. Barium consistencies tested included: honey thick, nectar thick and thin liquids. Patient exhibited oral transit delays, piecemeal swallowing and moderate amount of oral residuals post initial swallows with each consistency. Thin liquids via single straw sip were assessed first and patient exhibited swallow initiation delay at level of pyriform sinus and moderate amount of sensed aspiration which occured during the swallow. Patient grimacing when coughing and did appear to become anxious during coughing response. When coughing had subsided, SLP did assess her toleration with spoon sip of nectar thick liquids and a spoon sip of honey thick liquids. No penetration or aspiration with either of these consistencies, however they did result in moderate amount of barium residuals in vallecular sinus, pyriform sinus, posterior pharyngeal wall and just generally throughout pharynx. Presence of nasogastric feeding tube did appear to impact barium transit in esophagus but this is not likely the cause of her aspiration event and significant pharyngeal residuals. SLP is recommending continue NPO and allow PRN ice chips/water sips. SLP Visit Diagnosis Dysphagia, oropharyngeal phase (R13.12) Impact on safety and function Severe aspiration risk;Risk for inadequate nutrition/hydration     06/19/2022   3:13 PM Treatment Recommendations Treatment Recommendations Therapy as outlined in treatment plan below     06/19/2022   3:18 PM Prognosis Prognosis for Safe Diet Advancement Fair Barriers to Reach Goals Severity of deficits   06/19/2022   3:13 PM Diet  Recommendations SLP Diet Recommendations NPO Medication Administration Via alternative means     06/19/2022   3:13 PM Other Recommendations Oral Care Recommendations Oral care QID;Staff/trained caregiver to provide oral care Follow Up Recommendations Skilled nursing-short term rehab (<3 hours/day) Functional Status Assessment Patient has had a recent decline in their functional status and demonstrates the ability to make significant improvements in function in a reasonable and predictable amount of time.   06/19/2022   3:13 PM Frequency and Duration  Speech Therapy Frequency (ACUTE ONLY) min 2x/week Treatment Duration 2 weeks     06/19/2022   3:07 PM Oral Phase Oral Phase Impaired Oral - Honey Teaspoon Weak lingual manipulation;Reduced posterior propulsion;Piecemeal swallowing;Decreased bolus cohesion;Delayed oral transit;Lingual/palatal residue Oral - Nectar Teaspoon Delayed oral transit;Lingual/palatal residue;Weak lingual manipulation;Reduced posterior propulsion;Piecemeal swallowing Oral - Thin Straw Piecemeal swallowing;Premature spillage;Decreased bolus cohesion;Weak lingual manipulation;Reduced posterior propulsion    06/19/2022   3:12 PM Pharyngeal Phase Pharyngeal Material enters airway, passes BELOW cords and not ejected out despite cough attempt by patient    06/19/2022   3:09 PM Cervical Esophageal Phase  Cervical Esophageal Phase Impaired Cervical Esophageal Comment presence of nasogastric feeding tube did appear to restrict barium transit Sonia Baller, MA, CCC-SLP Speech Therapy                       LOS: 17 days   Oren Binet, MD  [facility]    To contact the attending provider between 7A-7P or the covering provider during after hours 7P-7A, please log into the web site [email or web address] and access using universal [place] password for that web site. If you do not have the password, please call the hospital operator.  06/21/2022, 11:04 AM

## 2022-06-21 NOTE — Progress Notes (Signed)
Neurology Progress Note  Brief HPI:  71 year old female nursing home resident with past medical history of CKD, depression, sarcoid, dyslipidemia, and hypertension.  On initial presentation patient was hypoxic with decreased responsiveness and found to have pneumonia as well. Her work up has been significant for sepsis due to to aspiration PNA,  as well she was noted to have dysphagia. Concern for neurosarcoidosis. Of note, she was recently started on Ingrezza for tardive dyskinesia.   Subjective: -Has persistent impaired EOM  -Reporting facial pain today states it is a 10/10 bilaterally down her cheek and jaw. tardive dyskinesia movements of the face seem more severe today. She is tearful today.   Exam: Vitals:   06/21/22 0400 06/21/22 0749  BP: 113/65 (!) 150/69  Pulse: 71 65  Resp: 12 14  Temp: 98.2 F (36.8 C) (!) 97.4 F (36.3 C)  SpO2: 97% 99%   Gen: In bed, reporting facial pain Resp: non-labored breathing, no grossly audible wheezing Eyes: clear today.  Cardiac: Perfusing extremities well  Abd: soft, nt  Neuro: Awake, alert, fully oriented other than to the date  CN: PERRL, EOM notable for intermittent bilateral incomplete eye adduction, impaired upgaze on R, impaired downgaze on L, states both sides of face feel funny but are equal, frequent blinking and facial movements, R NLF flattening, tongue with fewer tardive movements. Motor: Remains at least 4/5 strength on motor testing, effort is challenging today  Coordination: no frank ataxia FNF Sensation intact to light touch throughout   Pertinent Labs:  Basic Metabolic Panel: Recent Labs  Lab 06/15/22 0507 06/16/22 0202 06/17/22 0155 06/18/22 0827 06/20/22 0402  NA 134* 134* 138 137 134*  K 4.4 4.5 4.3 4.1 4.9  CL 99 99 100 102 97*  CO2 '27 25 27 26 24  '$ GLUCOSE 147* 122* 144* 164* 231*  BUN 31* 22 22 25* 36*  CREATININE 1.00 0.89 0.98 0.90 1.32*  CALCIUM 10.4* 10.4* 10.6* 10.1 10.5*     CBC: Recent Labs   Lab 06/16/22 0202 06/17/22 0155 06/18/22 0827 06/20/22 0402 06/21/22 0723  WBC 7.4 10.1 7.3 17.8* 12.8*  HGB 12.1 12.0 12.5 13.2 12.7  HCT 37.0 37.0 38.4 40.6 38.3  MCV 87.7 88.7 87.9 88.5 88.7  PLT 242 260 259 339 291     Coagulation Studies: No results for input(s): "LABPROT", "INR" in the last 72 hours.    Impression: Patient with known sarcoidosis admitted with worsening tardive dyskinesia after stopping benzo, now improved after restarting. Patient with multiple cranial nerve deficits c/f neurosarcoid. Recommend IVMP. No indication for LP given no pathognomonic CSF findings for neurosarcoid and no way to exclude neurologic involvement with CSF.  Recommendations: - Tranxene 2 times daily (8 AM, 8 PM)  - Eliquis is on hold in anticipation of PEG placement. Current on full dose lovenox for anticoagulation - Continue solumedrol 1g q 24 hrs x5 days  - Day one 06/18/2022 - Start carbamazepine for possible trigeminal neuralgia - Pt seems more lethargic today, will order EEG - Neurology will continue to follow  Patient seen and examined by NP/APP with MD. MD to update note as needed.   Janine Ores, DNP, FNP-BC [facility] Pager: [phone]   Attending Neurohospitalist Addendum Patient seen and examined with APP/Resident. Agree with the history and physical as documented above. Agree with the plan as documented, which I helped formulate. I have edited the note above to reflect my full findings and recommendations. I have independently reviewed the chart, obtained history, review of  systems and examined the patient.I have personally reviewed pertinent head/neck/spine imaging (CT/MRI). Please feel free to call with any questions.  -- Su Monks, MD [facility] [phone]  If 7pm- 7am, please page neurology on call as listed in [place].

## 2022-06-21 NOTE — Progress Notes (Signed)
EEG complete - results pending 

## 2022-06-22 ENCOUNTER — Inpatient Hospital Stay (HOSPITAL_COMMUNITY): Payer: BC Managed Care – PPO

## 2022-06-22 DIAGNOSIS — I480 Paroxysmal atrial fibrillation: Secondary | ICD-10-CM | POA: Diagnosis not present

## 2022-06-22 DIAGNOSIS — A419 Sepsis, unspecified organism: Secondary | ICD-10-CM | POA: Diagnosis not present

## 2022-06-22 DIAGNOSIS — J189 Pneumonia, unspecified organism: Secondary | ICD-10-CM | POA: Diagnosis not present

## 2022-06-22 DIAGNOSIS — G4733 Obstructive sleep apnea (adult) (pediatric): Secondary | ICD-10-CM | POA: Diagnosis not present

## 2022-06-22 HISTORY — PX: IR GASTROSTOMY TUBE MOD SED: IMG625

## 2022-06-22 LAB — BASIC METABOLIC PANEL
Anion gap: 11 (ref 5–15)
BUN: 63 mg/dL — ABNORMAL HIGH (ref 8–23)
CO2: 26 mmol/L (ref 22–32)
Calcium: 10.6 mg/dL — ABNORMAL HIGH (ref 8.9–10.3)
Chloride: 101 mmol/L (ref 98–111)
Creatinine, Ser: 1.33 mg/dL — ABNORMAL HIGH (ref 0.44–1.00)
GFR, Estimated: 43 mL/min — ABNORMAL LOW (ref >60)
Glucose, Bld: 167 mg/dL — ABNORMAL HIGH (ref 70–99)
Potassium: 4.7 mmol/L (ref 3.5–5.1)
Sodium: 138 mmol/L (ref 135–145)

## 2022-06-22 LAB — CBC
HCT: 38 % (ref 36.0–46.0)
Hemoglobin: 12.4 g/dL (ref 12.0–15.0)
MCH: 28.8 pg (ref 26.0–34.0)
MCHC: 32.6 g/dL (ref 30.0–36.0)
MCV: 88.2 fL (ref 80.0–100.0)
Platelets: 310 10*3/uL (ref 150–400)
RBC: 4.31 MIL/uL (ref 3.87–5.11)
RDW: 16.7 % — ABNORMAL HIGH (ref 11.5–15.5)
WBC: 10.5 10*3/uL (ref 4.0–10.5)
nRBC: 0 % (ref 0.0–0.2)

## 2022-06-22 LAB — GLUCOSE, CAPILLARY
Glucose-Capillary: 156 mg/dL — ABNORMAL HIGH (ref 70–99)
Glucose-Capillary: 158 mg/dL — ABNORMAL HIGH (ref 70–99)
Glucose-Capillary: 162 mg/dL — ABNORMAL HIGH (ref 70–99)
Glucose-Capillary: 164 mg/dL — ABNORMAL HIGH (ref 70–99)
Glucose-Capillary: 183 mg/dL — ABNORMAL HIGH (ref 70–99)
Glucose-Capillary: 215 mg/dL — ABNORMAL HIGH (ref 70–99)

## 2022-06-22 LAB — PROTIME-INR
INR: 1.2 (ref 0.8–1.2)
Prothrombin Time: 14.8 seconds (ref 11.4–15.2)

## 2022-06-22 MED ORDER — FENTANYL CITRATE (PF) 100 MCG/2ML IJ SOLN
INTRAMUSCULAR | Status: AC | PRN
  Administered 2022-06-22 (×2): 25 ug via INTRAVENOUS

## 2022-06-22 MED ORDER — LIDOCAINE VISCOUS HCL 2 % MT SOLN
OROMUCOSAL | Status: AC
  Filled 2022-06-22: qty 15

## 2022-06-22 MED ORDER — SODIUM CHLORIDE 0.9 % IV SOLN: INTRAVENOUS | Status: AC

## 2022-06-22 MED ORDER — IOHEXOL 300 MG/ML  SOLN
50.0000 mL | Freq: Once | INTRAMUSCULAR | Status: AC | PRN
  Administered 2022-06-22: 15 mL

## 2022-06-22 MED ORDER — FENTANYL CITRATE (PF) 100 MCG/2ML IJ SOLN
INTRAMUSCULAR | Status: AC
  Filled 2022-06-22: qty 4

## 2022-06-22 MED ORDER — LIDOCAINE HCL 1 % IJ SOLN
INTRAMUSCULAR | Status: AC
  Filled 2022-06-22: qty 20

## 2022-06-22 MED ORDER — MIDAZOLAM HCL 2 MG/2ML IJ SOLN
INTRAMUSCULAR | Status: AC | PRN
  Administered 2022-06-22 (×2): .5 mg via INTRAVENOUS

## 2022-06-22 MED ORDER — MIDAZOLAM HCL 2 MG/2ML IJ SOLN
INTRAMUSCULAR | Status: AC
  Filled 2022-06-22: qty 4

## 2022-06-22 MED ORDER — CEFAZOLIN SODIUM-DEXTROSE 2-4 GM/100ML-% IV SOLN
INTRAVENOUS | Status: AC
  Administered 2022-06-22: 2 g via INTRAVENOUS
  Filled 2022-06-22: qty 100

## 2022-06-22 MED ORDER — GLUCAGON HCL RDNA (DIAGNOSTIC) 1 MG IJ SOLR
INTRAMUSCULAR | Status: AC
  Filled 2022-06-22: qty 1

## 2022-06-22 MED ORDER — GLUCAGON HCL RDNA (DIAGNOSTIC) 1 MG IJ SOLR
INTRAMUSCULAR | Status: AC | PRN
  Administered 2022-06-22: .5 mg via INTRAVENOUS

## 2022-06-22 NOTE — Progress Notes (Signed)
PROGRESS NOTE        PATIENT DETAILS Name: Tara [name] Age: 71 y.o. Sex: female Date of Birth: [birth year] Admit Date: 06/04/2022 Admitting Physician Quintella Baton, MD CVE:[id number], Hunt Oris, MD  Brief Summary: Patient is a 71 y.o.  female with history of sarcoidosis, tardive dyskinesia, CKD stage IIIa, PAF, HTN, HLD, OSA who presented to the hospital on 11/20 with acute hypoxic respiratory failure due to aspiration PNA in the setting of worsening dysphagia/tardive dyskinesia.  She was also found to have multiple cranial nerve deficits-consistent with neurosarcoidosis and started on high-dose steroids.  Significant events: 11/20>> admit to [facility]-hypoxia-aspiration pneumonia. 12/04>> started on high-dose Solu-Medrol for cranial nerve deficits/secondary to neurosarcoid.  Significant studies: 11/20>> CXR: Left> right PNA 11/20>> CT head: No acute intracranial abnormality 11/21>> echo: EF 55%. 11/29>> MRI brain wo contrast: No acute intracranial abnormality 11/30>> MRI C-spine: No acute abnormality 11/30>> MRI brain w contrast: No pachymeningeal contrast-enhancement 12/01>> CT chest/abdomen/pelvis: No evidence of primary malignancy/metastatic disease 12/08>> EEG: No seizures.  Significant microbiology data: 11/20>> blood culture: No growth 11/20>> urine culture: Multiple species 11/20>> COVID/influenza PCR: Negative  Procedures: 11/22>>Cortrak tube placement  Consults: Neuro ID  Subjective: Facial pain better after starting carbamazepine yesterday.  PEG tube scheduled to be placed later today.  No major issues overnight.  Objective: Vitals: Blood pressure (!) 140/77, pulse 77, temperature 98 F (36.7 C), temperature source Oral, resp. rate 15, height '5\' 6"'$  (1.676 m), weight 77.5 kg, SpO2 95 %.   Exam: Gen Exam:Alert awake-not in any distress HEENT:atraumatic, normocephalic Chest: B/L clear to auscultation anteriorly CVS:S1S2 regular Abdomen:soft  non tender, non distended Extremities:no edema Neurology: Non focal Skin: no rash  Pertinent Labs/Radiology:    Latest Ref Rng & Units 06/22/2022    3:16 AM 06/21/2022    7:23 AM 06/20/2022    4:02 AM  CBC  WBC 4.0 - 10.5 K/uL 10.5  12.8  17.8   Hemoglobin 12.0 - 15.0 g/dL 12.4  12.7  13.2   Hematocrit 36.0 - 46.0 % 38.0  38.3  40.6   Platelets 150 - 400 K/uL 310  291  339     Lab Results  Component Value Date   NA 138 06/22/2022   K 4.7 06/22/2022   CL 101 06/22/2022   CO2 26 06/22/2022      Assessment/Plan: Severe sepsis with acute hypoxic respiratory failure due to aspiration PNA Sepsis physiology has resolved Completed a course of antimicrobial therapy All cultures remain negative Unfortunately continues to have severe dysphagia- see below  Oropharyngeal dysphagia Felt to be due to flare of tardive dyskinesia and neurosarcoidosis.   No significant improvement after restarting Tranxene/Ingrezza for tardive dyskinesia and IV steroids for neurosarcoidosis.   PEG tube planned for 12/8  Currently has a Cortrak tube since 11/22  Neurosarcoidosis flare With multiple cranial nerve deficits On day 5 of IV Solu-Medrol See above regarding dysphagia  Tardive dyskinesia flare Followed by movement clinic at [facility] to be worse after stopping benzos-now somewhat improved after restarting Continue Tranxene/Ingrezza  Facial Pain-?Trigeminal Neuralgia Better-after Tegretol started 12/7 Follow  AKI on CKD stage IIIa AKI hemodynamically mediated Mild creatinine elevation persists Avoid nephrotoxic agents Repeat lytes   Transaminitis Improved Likely related to sepsis Follow LFTs periodically  Minimally elevated troponin Likely demand ischemia Echo with stable EF  Persistent Afib Remains in Afib >  48 hours Rate controlled Continue Cardizem/flecainide/therapeutic Lovenox (resume eliquis once peg tube placed)  HTN BP stable-continue  hydralazine/Cardizem/Imdur  OSA Avoid CPAP given significant issues with dysphagia/oral secretions  Left lower lobe groundglass 4 mm nodule Further workup deferred to the outpatient setting.  Nutrition Status: Nutrition Problem: Inadequate oral intake Etiology: inability to eat Signs/Symptoms: NPO status Interventions: Tube feeding  BMI: Estimated body mass index is 27.58 kg/m as calculated from the following:   Height as of this encounter: '5\' 6"'$  (1.676 m).   Weight as of this encounter: 77.5 kg.   Code status:   Code Status: Full Code   DVT Prophylaxis: SCDs Start: 06/04/22 2221 Therapeutic Lovenox  Family Communication:  Tara [name] [phone] -updated over the phone 12/8   Disposition Plan: Status is: Inpatient Remains inpatient appropriate because: Severity of illness.   Planned Discharge Destination:Skilled nursing facility   Diet: Diet Order             Diet NPO time specified  Diet effective midnight                     Antimicrobial agents: Anti-infectives (From admission, onward)    Start     Dose/Rate Route Frequency Ordered Stop   06/22/22 0600  ceFAZolin (ANCEF) IVPB 2g/100 mL premix        2 g 200 mL/hr over 30 Minutes Intravenous To Radiology 06/20/22 1400 06/23/22 0600   06/06/22 1730  ceFEPIme (MAXIPIME) 2 g in sodium chloride 0.9 % 100 mL IVPB        2 g 200 mL/hr over 30 Minutes Intravenous Every 12 hours 06/06/22 1627 06/09/22 1649   06/05/22 2200  vancomycin (VANCOREADY) IVPB 750 mg/150 mL  Status:  Discontinued        750 mg 150 mL/hr over 60 Minutes Intravenous Every 24 hours 06/04/22 2328 06/05/22 1019   06/04/22 2330  ceFEPIme (MAXIPIME) 2 g in sodium chloride 0.9 % 100 mL IVPB  Status:  Discontinued        2 g 200 mL/hr over 30 Minutes Intravenous Every 24 hours 06/04/22 2324 06/06/22 1627   06/04/22 2330  vancomycin (VANCOREADY) IVPB 1500 mg/300 mL        1,500 mg 150 mL/hr over 120 Minutes Intravenous  Once  06/04/22 2324 06/05/22 0233   06/04/22 2230  cefTRIAXone (ROCEPHIN) 2 g in sodium chloride 0.9 % 100 mL IVPB  Status:  Discontinued        2 g 200 mL/hr over 30 Minutes Intravenous Every 24 hours 06/04/22 2223 06/04/22 2224   06/04/22 2230  azithromycin (ZITHROMAX) 500 mg in sodium chloride 0.9 % 250 mL IVPB  Status:  Discontinued        500 mg 250 mL/hr over 60 Minutes Intravenous Every 24 hours 06/04/22 2223 06/04/22 2224   06/04/22 1730  cefTRIAXone (ROCEPHIN) 2 g in sodium chloride 0.9 % 100 mL IVPB  Status:  Discontinued        2 g 200 mL/hr over 30 Minutes Intravenous Every 24 hours 06/04/22 1725 06/04/22 2321   06/04/22 1730  azithromycin (ZITHROMAX) 500 mg in sodium chloride 0.9 % 250 mL IVPB  Status:  Discontinued        500 mg 250 mL/hr over 60 Minutes Intravenous Every 24 hours 06/04/22 1725 06/04/22 2321        MEDICATIONS: Scheduled Meds:  brimonidine  1 drop Both Eyes BID   And   timolol  1 drop Both Eyes BID  carbamazepine  100 mg Oral BID   cholecalciferol  1,000 Units Per Tube Daily   clorazepate  7.5 mg Per Tube BID   diltiazem  60 mg Per Tube Q8H   [START ON 06/23/2022] enoxaparin (LOVENOX) injection  75 mg Subcutaneous BID   feeding supplement (PROSource TF20)  60 mL Per Tube Daily   ferrous sulfate  300 mg Per Tube Q breakfast   flecainide  50 mg Per Tube BID   hydrALAZINE  100 mg Per Tube TID   insulin aspart  0-9 Units Subcutaneous Q4H   isosorbide dinitrate  10 mg Per Tube TID   latanoprost  1 drop Both Eyes QHS   multivitamin with minerals  1 tablet Per Tube Daily   mouth rinse  15 mL Mouth Rinse 4 times per day   pantoprazole (PROTONIX) IV  40 mg Intravenous Q24H   polyethylene glycol  17 g Oral BID   polyvinyl alcohol  1 drop Both Eyes TID   valbenazine  40 mg Oral Daily   Continuous Infusions:   ceFAZolin (ANCEF) IV     feeding supplement (JEVITY 1.2 CAL) 1,000 mL (06/20/22 1846)   methylPREDNISolone (SOLU-MEDROL) injection Stopped (06/21/22  1512)   PRN Meds:.acetaminophen **OR** acetaminophen, ALPRAZolam, antiseptic oral rinse, melatonin, metoprolol tartrate, mouth rinse, polyvinyl alcohol, polyvinyl alcohol   I have personally reviewed following labs and imaging studies  LABORATORY DATA: CBC: Recent Labs  Lab 06/17/22 0155 06/18/22 0827 06/20/22 0402 06/21/22 0723 06/22/22 0316  WBC 10.1 7.3 17.8* 12.8* 10.5  HGB 12.0 12.5 13.2 12.7 12.4  HCT 37.0 38.4 40.6 38.3 38.0  MCV 88.7 87.9 88.5 88.7 88.2  PLT 260 259 339 291 310     Basic Metabolic Panel: Recent Labs  Lab 06/17/22 0155 06/18/22 0827 06/20/22 0402 06/21/22 0723 06/22/22 0316  NA 138 137 134* 139 138  K 4.3 4.1 4.9 4.9 4.7  CL 100 102 97* 103 101  CO2 '27 26 24 27 26  '$ GLUCOSE 144* 164* 231* 276* 167*  BUN 22 25* 36* 53* 63*  CREATININE 0.98 0.90 1.32* 1.38* 1.33*  CALCIUM 10.6* 10.1 10.5* 10.6* 10.6*     GFR: Estimated Creatinine Clearance: 42 mL/min (A) (by C-G formula based on SCr of 1.33 mg/dL (H)).  Liver Function Tests: No results for input(s): "AST", "ALT", "ALKPHOS", "BILITOT", "PROT", "ALBUMIN" in the last 168 hours. No results for input(s): "LIPASE", "AMYLASE" in the last 168 hours. No results for input(s): "AMMONIA" in the last 168 hours.  Coagulation Profile: Recent Labs  Lab 06/22/22 0316  INR 1.2    Cardiac Enzymes: No results for input(s): "CKTOTAL", "CKMB", "CKMBINDEX", "TROPONINI" in the last 168 hours.   BNP (last 3 results) No results for input(s): "PROBNP" in the last 8760 hours.  Lipid Profile: No results for input(s): "CHOL", "HDL", "LDLCALC", "TRIG", "CHOLHDL", "LDLDIRECT" in the last 72 hours.  Thyroid Function Tests: No results for input(s): "TSH", "T4TOTAL", "FREET4", "T3FREE", "THYROIDAB" in the last 72 hours.  Anemia Panel: No results for input(s): "VITAMINB12", "FOLATE", "FERRITIN", "TIBC", "IRON", "RETICCTPCT" in the last 72 hours.  Urine analysis:    Component Value Date/Time   COLORURINE  AMBER (A) 06/04/2022 2204   APPEARANCEUR HAZY (A) 06/04/2022 2204   LABSPEC 1.015 06/04/2022 2204   PHURINE 5.0 06/04/2022 2204   GLUCOSEU NEGATIVE 06/04/2022 2204   GLUCOSEU NEGATIVE 10/10/2021 1013   HGBUR NEGATIVE 06/04/2022 [place] 06/04/2022 [place] 06/04/2022 2204   PROTEINUR 30 (A) 06/04/2022  2204   UROBILINOGEN 0.2 10/10/2021 1013   NITRITE POSITIVE (A) 06/04/2022 2204   LEUKOCYTESUR MODERATE (A) 06/04/2022 2204    Sepsis Labs: Lactic Acid, Venous    Component Value Date/Time   LATICACIDVEN 1.5 06/05/2022 0038    MICROBIOLOGY: No results found for this or any previous visit (from the past 240 hour(s)).  RADIOLOGY STUDIES/RESULTS: EEG adult  Result Date: 06/22/2022 Landry Corporal, MD     06/22/2022  7:54 AM [facility] Routine EEG Report Video Performed: Performed Demographics: Patient Name:   Tara [name], Tara [name] Date of Birth:   04-13-1951 Identification Number:   MRN - [id number] Study Times: Study Start Time:   06/21/2022 23:13:00 Study End Time:   06/21/2022 23:39:27 Duration:   26 minutes Indication(s): Encephalopathy Technical Summary: This EEG was performed utilizing standard International 10-20 System of electrode placement. Data were obtained and interpreted utilizing referential montage recording, with reformatting to longitudinal, transverse bipolar, and referential montages as necessary for interpretation. State(s):      Drowsy Activation Procedures: Hyperventilation: Not performed Photic Stimulation: Not performed EEG Description: No normal patterns of awake or sleep were present during this recording. Intermittent generalized theta-delta slowing was present. Excess continuous generalized theta-delta slowing was present. Minor muscle, motion, electrode, and eye movement artifacts were occasionally noted. Impression: Abnormal EEG due to:   1) No normal patterns of awake or sleep  2)  Intermittent generalized theta-delta slowing  3) Continuous generalized theta-delta slowing  Clinical Correlation: This EEG is suggestive of a moderate encephalopathy, but is nonspecific as to etiology. The absence of epileptiform abnormalities does not preclude a clinical diagnosis of seizures. [facility] For Inpatient follow-up with [facility] physician please call [facility] [phone]. This is not an outpatient service. Post hospital discharge, please contact hospital directly.     LOS: 18 days   Oren Binet, MD  [facility]    To contact the attending provider between 7A-7P or the covering provider during after hours 7P-7A, please log into the web site [email or web address] and access using universal [place] password for that web site. If you do not have the password, please call the hospital operator.  06/22/2022, 11:26 AM

## 2022-06-22 NOTE — Procedures (Signed)
[facility]  Routine EEG Report  Video Performed: Performed  Demographics: Patient Name:   Tara [name], Tara [name]  Date of Birth:   01-09-1951  Identification Number:   MRN - [id number]  Study Times:  Study Start Time:   06/21/2022 23:13:00 Study End Time:   06/21/2022 23:39:27  Duration:   26 minutes  Indication(s): Encephalopathy  Technical Summary:  This EEG was performed utilizing standard International 10-20 System of electrode placement. Data were obtained and interpreted utilizing referential montage recording, with reformatting to longitudinal, transverse bipolar, and referential montages as necessary for interpretation.  State(s):      Drowsy   Activation Procedures: Hyperventilation: Not performed Photic Stimulation: Not performed   EEG Description:  No normal patterns of awake or sleep were present during this recording. Intermittent generalized theta-delta slowing was present. Excess continuous generalized theta-delta slowing was present. Minor muscle, motion, electrode, and eye movement artifacts were occasionally noted.  Impression:  Abnormal EEG due to:    1) No normal patterns of awake or sleep  2) Intermittent generalized theta-delta slowing  3) Continuous generalized theta-delta slowing    Clinical Correlation:  This EEG is suggestive of a moderate encephalopathy, but is nonspecific as to etiology. The absence of epileptiform abnormalities does not preclude a clinical diagnosis of seizures.   [facility] For Inpatient follow-up with [facility] physician please call [facility] [phone]. This is not an outpatient service. Post hospital discharge, please contact hospital directly.

## 2022-06-22 NOTE — Procedures (Signed)
Vascular and Interventional Radiology Procedure Note  Patient: Tara [name] DOB: [birth year] Medical Record Number: [id number] Note Date/Time: 06/22/22 1:55 PM   Performing Physician: Michaelle Birks, MD Assistant(s): None  Diagnosis: Dysphagia. Recent CVA  Procedure: PERCUTANEOUS GASTROSTOMY TUBE PLACEMENT  Anesthesia: Conscious Sedation Complications: None Estimated Blood Loss: Minimal  Findings:  Successful placement of a  55 F gastrostomy tube under fluoroscopy.   See detailed procedure note with images in PACS. The patient tolerated the procedure well without incident or complication and was returned to Floor Bed in stable condition.    Michaelle Birks, MD [facility] Radiology   Pager. [place]

## 2022-06-22 NOTE — Progress Notes (Signed)
Neurology Progress Note  Brief HPI:  71 year old female nursing home resident with past medical history of CKD, depression, sarcoid, dyslipidemia, and hypertension.  On initial presentation patient was hypoxic with decreased responsiveness and found to have pneumonia as well. Her work up has been significant for sepsis due to to aspiration PNA,  as well she was noted to have dysphagia. Concern for neurosarcoidosis. Of note, she was recently started on Ingrezza for tardive dyskinesia.   Subjective: Patient reports that her facial pain has remained severe, but involuntary facial movements appear to be improved today.  She does not feel that she has developed any improvement with steroids and carbamazepine.  EEG shows no seizure activity.  Exam: Vitals:   06/22/22 0000 06/22/22 0400  BP: 104/61 (!) 140/77  Pulse: 66 77  Resp: 12 15  Temp: 97.9 F (36.6 C) 98 F (36.7 C)  SpO2: 95% 95%   Gen: In bed, reporting facial pain Resp: non-labored breathing, no grossly audible wheezing Eyes: clear today.  Cardiac: Perfusing extremities well   Neuro: Awake, alert, fully oriented to person place time and situation CN: PERRL, EOM notable for intermittent bilateral incomplete eye adduction, improved upgaze and downgaze on both right and left sides, states both sides of face are equal, blinking and facial movements much reduced from earlier, R NLF flattening, tongue midline Motor: Remains at least 4/5 strength on motor testing, improved effort today Sensation intact to light touch throughout   Pertinent Labs:  Basic Metabolic Panel: Recent Labs  Lab 06/17/22 0155 06/18/22 0827 06/20/22 0402 06/21/22 0723 06/22/22 0316  NA 138 137 134* 139 138  K 4.3 4.1 4.9 4.9 4.7  CL 100 102 97* 103 101  CO2 '27 26 24 27 26  '$ GLUCOSE 144* 164* 231* 276* 167*  BUN 22 25* 36* 53* 63*  CREATININE 0.98 0.90 1.32* 1.38* 1.33*  CALCIUM 10.6* 10.1 10.5* 10.6* 10.6*     CBC: Recent Labs  Lab  06/17/22 0155 06/18/22 0827 06/20/22 0402 06/21/22 0723 06/22/22 0316  WBC 10.1 7.3 17.8* 12.8* 10.5  HGB 12.0 12.5 13.2 12.7 12.4  HCT 37.0 38.4 40.6 38.3 38.0  MCV 88.7 87.9 88.5 88.7 88.2  PLT 260 259 339 291 310     Coagulation Studies: Recent Labs    06/22/22 0316  LABPROT 14.8  INR 1.2      Impression: Patient with known sarcoidosis admitted with worsening tardive dyskinesia after stopping benzo, now improved after restarting. Patient with multiple cranial nerve deficits c/f neurosarcoid. Recommended IVMP end date 12/8. No indication for LP given no pathognomonic CSF findings for neurosarcoid and no way to exclude neurologic involvement with CSF.  Recommendations: - Tranxene 2 times daily (8 AM, 8 PM)  - Eliquis is on hold in anticipation of PEG placement. Current on full dose lovenox for anticoagulation - Continue solumedrol 1g q 24 hrs x5 days  - Day one 06/18/2022 -Continue carbamazepine for possible trigeminal neuralgia - Neurology will continue to follow  Patient seen and examined by NP/APP with MD. MD to update note as needed.   [place] , MSN, AGACNP-BC [facility] See Amion for schedule and pager information 06/22/2022 11:09 AM    Attending Neurohospitalist Addendum Patient seen and examined with APP/Resident. Agree with the history and physical as documented above. Agree with the plan as documented, which I helped formulate. I have edited the note above to reflect my full findings and recommendations. I have independently reviewed the chart, obtained history, review  of systems and examined the patient.I have personally reviewed pertinent head/neck/spine imaging (CT/MRI). Please feel free to call with any questions.  Day 5/5 solumedrol today. Some improvement in EOM. Underwent PEG placement this evening. Some improvement in face discomfort with CBZ. Will continue to follow.  -- Su Monks, MD [facility] [phone]  If 7pm- 7am, please page neurology on call as listed in [place].

## 2022-06-22 NOTE — Progress Notes (Signed)
SLP Cancellation Note  Patient Details Name: Tara [name] MRN: [id number] DOB: 16-Apr-1951   Cancelled treatment:       Reason Eval/Treat Not Completed: Medical issues which prohibited therapy (planned for PEG today per chart). Will f/u as able.     Osie Bond., M.A. [place] Office [phone]  Secure chat preferred  06/22/2022, 10:31 AM

## 2022-06-23 DIAGNOSIS — G4733 Obstructive sleep apnea (adult) (pediatric): Secondary | ICD-10-CM | POA: Diagnosis not present

## 2022-06-23 DIAGNOSIS — J189 Pneumonia, unspecified organism: Secondary | ICD-10-CM | POA: Diagnosis not present

## 2022-06-23 DIAGNOSIS — A419 Sepsis, unspecified organism: Secondary | ICD-10-CM | POA: Diagnosis not present

## 2022-06-23 DIAGNOSIS — I480 Paroxysmal atrial fibrillation: Secondary | ICD-10-CM | POA: Diagnosis not present

## 2022-06-23 LAB — BASIC METABOLIC PANEL
Anion gap: 10 (ref 5–15)
BUN: 54 mg/dL — ABNORMAL HIGH (ref 8–23)
CO2: 27 mmol/L (ref 22–32)
Calcium: 10.1 mg/dL (ref 8.9–10.3)
Chloride: 107 mmol/L (ref 98–111)
Creatinine, Ser: 1.25 mg/dL — ABNORMAL HIGH (ref 0.44–1.00)
GFR, Estimated: 47 mL/min — ABNORMAL LOW (ref >60)
Glucose, Bld: 120 mg/dL — ABNORMAL HIGH (ref 70–99)
Potassium: 3.9 mmol/L (ref 3.5–5.1)
Sodium: 144 mmol/L (ref 135–145)

## 2022-06-23 LAB — GLUCOSE, CAPILLARY
Glucose-Capillary: 129 mg/dL — ABNORMAL HIGH (ref 70–99)
Glucose-Capillary: 137 mg/dL — ABNORMAL HIGH (ref 70–99)
Glucose-Capillary: 150 mg/dL — ABNORMAL HIGH (ref 70–99)
Glucose-Capillary: 160 mg/dL — ABNORMAL HIGH (ref 70–99)
Glucose-Capillary: 171 mg/dL — ABNORMAL HIGH (ref 70–99)
Glucose-Capillary: 183 mg/dL — ABNORMAL HIGH (ref 70–99)

## 2022-06-23 MED ORDER — FREE WATER
160.0000 mL | Freq: Four times a day (QID) | Status: DC
  Administered 2022-06-23 – 2022-06-26 (×12): 160 mL

## 2022-06-23 MED ORDER — JEVITY 1.2 CAL PO LIQD
1000.0000 mL | ORAL | Status: DC
  Administered 2022-06-23 – 2022-06-24 (×2): 1000 mL
  Filled 2022-06-23 (×6): qty 1000

## 2022-06-23 MED ORDER — OXYCODONE HCL 5 MG PO TABS
5.0000 mg | ORAL_TABLET | Freq: Four times a day (QID) | ORAL | Status: DC | PRN
  Administered 2022-06-23 – 2022-06-29 (×4): 5 mg
  Filled 2022-06-23 (×5): qty 1

## 2022-06-23 MED ORDER — CARBAMAZEPINE ER 200 MG PO TB12
200.0000 mg | ORAL_TABLET | Freq: Two times a day (BID) | ORAL | Status: DC
  Administered 2022-06-23 – 2022-06-24 (×2): 200 mg via ORAL
  Filled 2022-06-23 (×4): qty 1

## 2022-06-23 MED ORDER — MORPHINE SULFATE (PF) 2 MG/ML IV SOLN
1.0000 mg | INTRAVENOUS | Status: DC | PRN
  Administered 2022-06-23: 1 mg via INTRAVENOUS
  Filled 2022-06-23: qty 1

## 2022-06-23 NOTE — Progress Notes (Signed)
Nutrition Follow-up  DOCUMENTATION CODES:   Not applicable  INTERVENTION:   Tube Feeds via PEG:  Restart, Jevity 1.2 at 60 mL/hr (1440 mL/day), start at 30 mL and advance by 10 mL q4h to goal rate 60 mL ProSource TF20 - daily  160 mL free water q6h Provides 1808 kcal, 100 gm protein, and 1802 mL free water daily. Plan to transition to bolus feeds in a few days a pt tolerates feeds via PEG.   NUTRITION DIAGNOSIS:   Inadequate oral intake related to inability to eat as evidenced by NPO status. - Being addressed via TF  GOAL:   Patient will meet greater than or equal to 90% of their needs - Being met via TF  MONITOR:   Diet advancement, Labs, TF tolerance, I & O's, Weight trends  REASON FOR ASSESSMENT:   Consult Enteral/tube feeding initiation and management  ASSESSMENT:   71 y.o. female presented to the ED with decreased consciousness from SNF. PMH includes CKD IV, MDD, and HTN. Pt admitted with sepsis secondary to PNA, AKI on CKD, acute liver failure, and elevated troponin.  11/22 - Cortrak placed (tip pyloric region) 11/29 - diet advanced to full liquids  12/03 - NPO 12/05 - MBS, recommend NPO 12/08 - Cortrak removed, PEG placed  RD working remotely at time of assessment. Received page and consult to start enteral nutrition via PEG. Pt previously had Cortrak placed for enteral nutrition. Will restart continuous nutrition regimen and adjust to bolus feeds in a few days. Pt weight has remained stable during admission.  Discussed with RN, RN reports that pt complained of pain after morning meds. Plan to start at half rate in increase to goal.   Discussed plan with MD to transition to bolus feeds in a few days as pt tolerates continuous feeds via PEG.   Medications reviewed and include: Vitamin D3, Ferrous Sulfate, NovoLog SSI, MVI, Protonix, Miralax Labs reviewed: BUN 54, Creatinine 1.25, 24 hr CBGs 129-183  Diet Order:   Diet Order             Diet NPO time  specified  Diet effective midnight                  EDUCATION NEEDS:   Not appropriate for education at this time  Skin:  Skin Assessment: Reviewed RN Assessment  Last BM:  12/8  Height:  Ht Readings from Last 1 Encounters:  06/04/22 _0  (1.676 m)   Weight:  Wt Readings from Last 1 Encounters:  06/23/22 75.5 kg   Ideal Body Weight:  59.1 kg  BMI:  Body mass index is 26.87 kg/m.  Estimated Nutritional Needs:  Kcal:  1700-1900 Protein:  85-100 grams Fluid:  >/= 1.7 L    Hermina Barters RD, LDN Clinical Dietitian See [facility] for contact information.

## 2022-06-23 NOTE — Progress Notes (Addendum)
Tara [name] is a 71 y.o. female s/p image guided gastrostomy tube placement with Dr. Maryelizabeth Kaufmann yesterday.   G tube site appears clean, dry, with old clotted blood but no active bleeding, no sign of acute infection.  Has two T- Fastners under the bumper, will fall spontaneously or be removed by IR APP after 12/18.   Ok to use the gastrostomy tube.   Please call IR for questions and concerns regarding the G tube.    Armando Gang Baltazar [place] PA-C 06/23/2022 9:38 AM

## 2022-06-23 NOTE — Progress Notes (Signed)
Neurology Progress Note  Brief HPI:  71 year old female nursing home resident with past medical history of CKD, depression, sarcoid, dyslipidemia, and hypertension.  On initial presentation patient was hypoxic with decreased responsiveness and found to have pneumonia as well. Her work up has been significant for sepsis due to to aspiration PNA,  as well she was noted to have dysphagia. Concern for neurosarcoidosis. Of note, she was recently started on Ingrezza for tardive dyskinesia.   Subjective: Patient reports that her facial pain has remained severe, but involuntary facial movements appear to be improved.  Exam: Vitals:   06/23/22 1206 06/23/22 1600  BP: (!) 172/103 (!) 163/86  Pulse: 98 (!) 104  Resp: 16 14  Temp:    SpO2: 97% 96%   Gen: In bed, reporting facial pain Resp: non-labored breathing, no grossly audible wheezing Eyes: clear today.  Cardiac: Perfusing extremities well   Neuro: Awake, alert, fully oriented to person place time and situation CN: PERRL, EOM notable for intermittent bilateral incomplete eye adduction, improved upgaze and downgaze on both right and left sides, states both sides of face are equal, blinking and facial movements much reduced from earlier, R NLF flattening, tongue midline Motor: Remains at least 4/5 strength on motor testing, improved effort today Sensation intact to light touch throughout   Pertinent Labs:  Basic Metabolic Panel: Recent Labs  Lab 06/18/22 0827 06/20/22 0402 06/21/22 0723 06/22/22 0316 06/23/22 0152  NA 137 134* 139 138 144  K 4.1 4.9 4.9 4.7 3.9  CL 102 97* 103 101 107  CO2 '26 24 27 26 27  '$ GLUCOSE 164* 231* 276* 167* 120*  BUN 25* 36* 53* 63* 54*  CREATININE 0.90 1.32* 1.38* 1.33* 1.25*  CALCIUM 10.1 10.5* 10.6* 10.6* 10.1     CBC: Recent Labs  Lab 06/17/22 0155 06/18/22 0827 06/20/22 0402 06/21/22 0723 06/22/22 0316  WBC 10.1 7.3 17.8* 12.8* 10.5  HGB 12.0 12.5 13.2 12.7 12.4  HCT 37.0 38.4 40.6  38.3 38.0  MCV 88.7 87.9 88.5 88.7 88.2  PLT 260 259 339 291 310     Coagulation Studies: Recent Labs    06/22/22 0316  LABPROT 14.8  INR 1.2      Impression: Patient with known sarcoidosis admitted with worsening tardive dyskinesia after stopping benzo, now improved after restarting. Patient with multiple cranial nerve deficits c/f neurosarcoid. Recommended IVMP end date 12/8. No indication for LP given no pathognomonic CSF findings for neurosarcoid and no way to exclude neurologic involvement with CSF.  Recommendations: - Tranxene 2 times daily (8 AM, 8 PM)  - S/p PEG placement 12/8 - S/p 5 days IVMP end date 12/8 - Increase carbamazepine to '200mg'$  bid for trigeminal neuralgia.  - Plan to reach out to outpatient psychiatrist on Mon. Patient is depressed but has hx SI on prozac, will discuss options for antidepressants with her outpatient provider - Neurology will continue to follow  Su Monks, MD [facility] [phone]  If [place], please page neurology on call as listed in [place].

## 2022-06-23 NOTE — Plan of Care (Signed)
  Problem: Education: Goal: Knowledge of General Education information will improve Description: Including pain rating scale, medication(s)/side effects and non-pharmacologic comfort measures Outcome: Progressing   Problem: Health Behavior/Discharge Planning: Goal: Ability to manage health-related needs will improve Outcome: Progressing   Problem: Clinical Measurements: Goal: Ability to maintain clinical measurements within normal limits will improve Outcome: Progressing Goal: Will remain free from infection Outcome: Progressing Goal: Diagnostic test results will improve Outcome: Progressing Goal: Respiratory complications will improve Outcome: Progressing Goal: Cardiovascular complication will be avoided Outcome: Progressing   Problem: Activity: Goal: Risk for activity intolerance will decrease Outcome: Progressing   Problem: Nutrition: Goal: Adequate nutrition will be maintained Outcome: Progressing   Problem: Coping: Goal: Level of anxiety will decrease Outcome: Progressing   Problem: Elimination: Goal: Will not experience complications related to bowel motility Outcome: Progressing Goal: Will not experience complications related to urinary retention Outcome: Progressing   Problem: Pain Managment: Goal: General experience of comfort will improve Outcome: Progressing   Problem: Safety: Goal: Ability to remain free from injury will improve Outcome: Progressing   Problem: Skin Integrity: Goal: Risk for impaired skin integrity will decrease Outcome: Progressing   Problem: Education: Goal: Ability to describe self-care measures that may prevent or decrease complications (Diabetes Survival Skills Education) will improve Outcome: Progressing   Problem: Coping: Goal: Ability to adjust to condition or change in health will improve Outcome: Progressing   Problem: Fluid Volume: Goal: Ability to maintain a balanced intake and output will improve Outcome:  Progressing   Problem: Health Behavior/Discharge Planning: Goal: Ability to identify and utilize available resources and services will improve Outcome: Progressing Goal: Ability to manage health-related needs will improve Outcome: Progressing   Problem: Metabolic: Goal: Ability to maintain appropriate glucose levels will improve Outcome: Progressing   Problem: Nutritional: Goal: Maintenance of adequate nutrition will improve Outcome: Progressing Goal: Progress toward achieving an optimal weight will improve Outcome: Progressing   Problem: Skin Integrity: Goal: Risk for impaired skin integrity will decrease Outcome: Progressing

## 2022-06-23 NOTE — Progress Notes (Addendum)
PROGRESS NOTE        PATIENT DETAILS Name: Tara [name] Age: 71 y.o. Sex: female Date of Birth: 02/18/1951 Admit Date: 06/04/2022 Admitting Physician Quintella Baton, MD GBT:DVVO, Hunt Oris, MD  Brief Summary: Patient is a 71 y.o.  female with history of sarcoidosis, tardive dyskinesia, CKD stage IIIa, PAF, HTN, HLD, OSA who presented to the hospital on 11/20 with acute hypoxic respiratory failure due to aspiration PNA in the setting of worsening dysphagia/tardive dyskinesia.  She was also found to have multiple cranial nerve deficits-consistent with neurosarcoidosis and started on high-dose steroids.  Unfortunately-in spite of resumption of her benzodiazepine/high-dose IV steroids-she continued to have severe dysphagia and required PEG tube insertion on 12/8.  See below for further details.  Significant events: 11/20>> admit to [facility]-hypoxia-aspiration pneumonia. 12/04>> started on high-dose Solu-Medrol for cranial nerve deficits/secondary to neurosarcoid.  Significant studies: 11/20>> CXR: Left> right PNA 11/20>> CT head: No acute intracranial abnormality 11/21>> echo: EF 55%. 11/29>> MRI brain wo contrast: No acute intracranial abnormality 11/30>> MRI C-spine: No acute abnormality 11/30>> MRI brain w contrast: No pachymeningeal contrast-enhancement 12/01>> CT chest/abdomen/pelvis: No evidence of primary malignancy/metastatic disease 12/08>> EEG: No seizures.  Significant microbiology data: 11/20>> blood culture: No growth 11/20>> urine culture: Multiple species 11/20>> COVID/influenza PCR: Negative  Procedures: 11/22>>Cortrak tube placement 12/08>> PEG tube placed by IR  Consults: Neuro ID  Subjective: Some facial pain continues  Objective: Vitals: Blood pressure (!) 191/99, pulse (!) 101, temperature 97.7 F (36.5 C), temperature source Oral, resp. rate 20, height '5\' 6"'$  (1.676 m), weight 75.5 kg, SpO2 98 %.   Exam: Gen Exam:Alert awake-not in  any distress HEENT:atraumatic, normocephalic Chest: B/L clear to auscultation anteriorly CVS:S1S2 regular Abdomen:soft non tender, non distended Extremities:no edema Neurology: Non focal-generalized weakness. Skin: no rash  Pertinent Labs/Radiology:    Latest Ref Rng & Units 06/22/2022    3:16 AM 06/21/2022    7:23 AM 06/20/2022    4:02 AM  CBC  WBC 4.0 - 10.5 K/uL 10.5  12.8  17.8   Hemoglobin 12.0 - 15.0 g/dL 12.4  12.7  13.2   Hematocrit 36.0 - 46.0 % 38.0  38.3  40.6   Platelets 150 - 400 K/uL 310  291  339     Lab Results  Component Value Date   NA 144 06/23/2022   K 3.9 06/23/2022   CL 107 06/23/2022   CO2 27 06/23/2022      Assessment/Plan: Severe sepsis with acute hypoxic respiratory failure due to aspiration PNA Sepsis physiology has resolved Completed a course of antimicrobial therapy All cultures remain negative Unfortunately continues to have severe dysphagia- see below  Oropharyngeal dysphagia Felt to be due to flare of tardive dyskinesia and neurosarcoidosis.   No significant improvement after restarting Tranxene/Ingrezza for tardive dyskinesia and IV steroids for neurosarcoidosis.   PEG tube Sequelae placed on 12/8-previously had  Cortrak Nutrition services consulted to initiate PEG tube feeds.  Neurosarcoidosis flare With multiple cranial nerve deficits Completed 5 days of Solu-Medrol   Tardive dyskinesia flare Followed by movement clinic at [place] to be worse after stopping benzos-now somewhat improved after restarting Continue Tranxene/Ingrezza  Facial Pain-?Trigeminal Neuralgia Better-but continues to have facial pain  Started Tegretol on 12/7  Follow  AKI on CKD stage IIIa AKI hemodynamically mediated Creatinine level slowly improving Avoid nephrotoxic agents Repeat lytes  Transaminitis Improved Likely related to sepsis Follow LFTs periodically  Minimally elevated troponin Likely demand ischemia Echo with stable  EF  Persistent Afib Remains in Afib for past several days Rate controlled PEG tube now in place-switch Lovenox to Eliquis Continue Cardizem Will need to discuss with cardiology regarding if flecainide needs to be continued given that patient is in persistent atrial fibrillation now.  Addendum Discussed with Dr. Percival Spanish over the phone-leave flecainide as is-have patient follow-up with primary cardiologist.  HTN BP stable-continue hydralazine/Cardizem/Imdur  OSA Avoid CPAP given significant issues with dysphagia/oral secretions  Left lower lobe groundglass 4 mm nodule Further workup deferred to the outpatient setting.  Nutrition Status: Nutrition Problem: Inadequate oral intake Etiology: inability to eat Signs/Symptoms: NPO status Interventions: Tube feeding  BMI: Estimated body mass index is 26.87 kg/m as calculated from the following:   Height as of this encounter: '5\' 6"'$  (1.676 m).   Weight as of this encounter: 75.5 kg.   Code status:   Code Status: Full Code   DVT Prophylaxis: SCDs Start: 06/04/22 2221 Therapeutic Lovenox  Family Communication:  Sister [name] [phone] -updated over the phone 12/8   Disposition Plan: Status is: Inpatient Remains inpatient appropriate because: Severity of illness.   Planned Discharge Destination:Skilled nursing facility   Diet: Diet Order             Diet NPO time specified  Diet effective midnight                     Antimicrobial agents: Anti-infectives (From admission, onward)    Start     Dose/Rate Route Frequency Ordered Stop   06/22/22 0600  ceFAZolin (ANCEF) IVPB 2g/100 mL premix        2 g 200 mL/hr over 30 Minutes Intravenous To Radiology 06/20/22 1400 06/22/22 1259   06/06/22 1730  ceFEPIme (MAXIPIME) 2 g in sodium chloride 0.9 % 100 mL IVPB        2 g 200 mL/hr over 30 Minutes Intravenous Every 12 hours 06/06/22 1627 06/09/22 1649   06/05/22 2200  vancomycin (VANCOREADY) IVPB 750  mg/150 mL  Status:  Discontinued        750 mg 150 mL/hr over 60 Minutes Intravenous Every 24 hours 06/04/22 2328 06/05/22 1019   06/04/22 2330  ceFEPIme (MAXIPIME) 2 g in sodium chloride 0.9 % 100 mL IVPB  Status:  Discontinued        2 g 200 mL/hr over 30 Minutes Intravenous Every 24 hours 06/04/22 2324 06/06/22 1627   06/04/22 2330  vancomycin (VANCOREADY) IVPB 1500 mg/300 mL        1,500 mg 150 mL/hr over 120 Minutes Intravenous  Once 06/04/22 2324 06/05/22 0233   06/04/22 2230  cefTRIAXone (ROCEPHIN) 2 g in sodium chloride 0.9 % 100 mL IVPB  Status:  Discontinued        2 g 200 mL/hr over 30 Minutes Intravenous Every 24 hours 06/04/22 2223 06/04/22 2224   06/04/22 2230  azithromycin (ZITHROMAX) 500 mg in sodium chloride 0.9 % 250 mL IVPB  Status:  Discontinued        500 mg 250 mL/hr over 60 Minutes Intravenous Every 24 hours 06/04/22 2223 06/04/22 2224   06/04/22 1730  cefTRIAXone (ROCEPHIN) 2 g in sodium chloride 0.9 % 100 mL IVPB  Status:  Discontinued        2 g 200 mL/hr over 30 Minutes Intravenous Every 24 hours 06/04/22 1725 06/04/22 2321   06/04/22 1730  azithromycin (ZITHROMAX) 500 mg in sodium chloride 0.9 % 250 mL IVPB  Status:  Discontinued        500 mg 250 mL/hr over 60 Minutes Intravenous Every 24 hours 06/04/22 1725 06/04/22 2321        MEDICATIONS: Scheduled Meds:  brimonidine  1 drop Both Eyes BID   And   timolol  1 drop Both Eyes BID   carbamazepine  100 mg Oral BID   cholecalciferol  1,000 Units Per Tube Daily   clorazepate  7.5 mg Per Tube BID   diltiazem  60 mg Per Tube Q8H   enoxaparin (LOVENOX) injection  75 mg Subcutaneous BID   feeding supplement (PROSource TF20)  60 mL Per Tube Daily   ferrous sulfate  300 mg Per Tube Q breakfast   flecainide  50 mg Per Tube BID   hydrALAZINE  100 mg Per Tube TID   insulin aspart  0-9 Units Subcutaneous Q4H   isosorbide dinitrate  10 mg Per Tube TID   latanoprost  1 drop Both Eyes QHS   multivitamin with  minerals  1 tablet Per Tube Daily   pantoprazole (PROTONIX) IV  40 mg Intravenous Q24H   polyethylene glycol  17 g Oral BID   polyvinyl alcohol  1 drop Both Eyes TID   valbenazine  40 mg Oral Daily   Continuous Infusions:   PRN Meds:.acetaminophen **OR** acetaminophen, ALPRAZolam, melatonin, metoprolol tartrate, polyvinyl alcohol   I have personally reviewed following labs and imaging studies  LABORATORY DATA: CBC: Recent Labs  Lab 06/17/22 0155 06/18/22 0827 06/20/22 0402 06/21/22 0723 06/22/22 0316  WBC 10.1 7.3 17.8* 12.8* 10.5  HGB 12.0 12.5 13.2 12.7 12.4  HCT 37.0 38.4 40.6 38.3 38.0  MCV 88.7 87.9 88.5 88.7 88.2  PLT 260 259 339 291 310     Basic Metabolic Panel: Recent Labs  Lab 06/18/22 0827 06/20/22 0402 06/21/22 0723 06/22/22 0316 06/23/22 0152  NA 137 134* 139 138 144  K 4.1 4.9 4.9 4.7 3.9  CL 102 97* 103 101 107  CO2 '26 24 27 26 27  '$ GLUCOSE 164* 231* 276* 167* 120*  BUN 25* 36* 53* 63* 54*  CREATININE 0.90 1.32* 1.38* 1.33* 1.25*  CALCIUM 10.1 10.5* 10.6* 10.6* 10.1     GFR: Estimated Creatinine Clearance: 44.1 mL/min (A) (by C-G formula based on SCr of 1.25 mg/dL (H)).  Liver Function Tests: No results for input(s): "AST", "ALT", "ALKPHOS", "BILITOT", "PROT", "ALBUMIN" in the last 168 hours. No results for input(s): "LIPASE", "AMYLASE" in the last 168 hours. No results for input(s): "AMMONIA" in the last 168 hours.  Coagulation Profile: Recent Labs  Lab 06/22/22 0316  INR 1.2     Cardiac Enzymes: No results for input(s): "CKTOTAL", "CKMB", "CKMBINDEX", "TROPONINI" in the last 168 hours.   BNP (last 3 results) No results for input(s): "PROBNP" in the last 8760 hours.  Lipid Profile: No results for input(s): "CHOL", "HDL", "LDLCALC", "TRIG", "CHOLHDL", "LDLDIRECT" in the last 72 hours.  Thyroid Function Tests: No results for input(s): "TSH", "T4TOTAL", "FREET4", "T3FREE", "THYROIDAB" in the last 72 hours.  Anemia Panel: No  results for input(s): "VITAMINB12", "FOLATE", "FERRITIN", "TIBC", "IRON", "RETICCTPCT" in the last 72 hours.  Urine analysis:    Component Value Date/Time   COLORURINE AMBER (A) 06/04/2022 2204   APPEARANCEUR HAZY (A) 06/04/2022 2204   LABSPEC 1.015 06/04/2022 2204   PHURINE 5.0 06/04/2022 2204   GLUCOSEU NEGATIVE 06/04/2022 2204   GLUCOSEU NEGATIVE 10/10/2021 1013  HGBUR NEGATIVE 06/04/2022 [place] 06/04/2022 2204   [place] Level 06/04/2022 2204   PROTEINUR 30 (A) 06/04/2022 2204   UROBILINOGEN 0.2 10/10/2021 1013   NITRITE POSITIVE (A) 06/04/2022 2204   LEUKOCYTESUR MODERATE (A) 06/04/2022 2204    Sepsis Labs: Lactic Acid, Venous    Component Value Date/Time   LATICACIDVEN 1.5 06/05/2022 0038    MICROBIOLOGY: No results found for this or any previous visit (from the past 240 hour(s)).  RADIOLOGY STUDIES/RESULTS: IR GASTROSTOMY TUBE MOD SED  Result Date: 06/22/2022 INDICATION: Dysphagia.  CVA. EXAM: PERCUTANEOUS GASTROSTOMY TUBE PLACEMENT COMPARISON:  CT CAP, 06/15/2022. MEDICATIONS: Ancef 2 gm IV; Antibiotics were administered within 1 hour of the procedure. CONTRAST:  15 mL of Isovue 300 administered into the gastric lumen. ANESTHESIA/SEDATION: Moderate (conscious) sedation was employed during this procedure. A total of Versed 1 mg and Fentanyl 50 mcg was administered intravenously. Moderate Sedation Time: 13 minutes. The patient's level of consciousness and vital signs were monitored continuously by radiology nursing throughout the procedure under my direct supervision. FLUOROSCOPY TIME:  Fluoroscopic dose; 2 mGy COMPLICATIONS: None immediate. PROCEDURE: Informed written consent was obtained from the patient and/or patient's representative following explanation of the procedure, risks, benefits and alternatives. A time out was performed prior to the initiation of the procedure. Maximal barrier sterile technique utilized including caps, mask, sterile  gowns, sterile gloves, large sterile drape, hand hygiene and Betadine prep. The left upper quadrant was sterilely prepped and draped. A oral gastric catheter was inserted into the stomach under fluoroscopy. The existing nasogastric feeding tube was removed. The left costal margin and barium opacified transverse colon were identified and avoided. Air was injected into the stomach for insufflation and visualization under fluoroscopy. Under sterile conditions and local anesthesia, 2 T tacks were utilized to pexy the anterior aspect of the stomach against the ventral abdominal wall. Contrast injection confirmed appropriate positioning of each of the T tacks. An incision was made between the T tacks and a 17 gauge trocar needle was utilized to access the stomach. Needle position was confirmed within the stomach with aspiration of air and injection of a small amount of contrast. A stiff Glidewire was advanced into the gastric lumen and under intermittent fluoroscopic guidance, the access needle was exchanged for a Kumpe catheter. With the use of the Kumpe catheter, a stiff Glidewire was advanced into the horizontal segment of the duodenum. Under intermittent fluoroscopic guidance, the Kumpe catheter was exchanged for a telescoping peel-away sheath, ultimately allowing placement of a 18-French balloon retention gastrostomy tube. The retention balloon was insufflated with a mixture of dilute saline and contrast and pulled taut against the anterior wall of the stomach. The external disc was cinched. Contrast injection confirms positioning within the stomach. Several spot radiographic images were obtained in various obliquities for documentation. T he patient tolerated procedure well without immediate post procedural complication. FINDINGS: After successful fluoroscopic guided placement, the gastrostomy tube is appropriately positioned with internal retention balloon against the ventral aspect of the gastric lumen.  IMPRESSION: Successful insertion of an 18 Fr balloon retention gastrostomy tube. The gastrostomy may be used immediately for medication administration and in 24 hrs for the initiation of feeds. PLAN: Patient will return to [facility] ([facility]) for routine gastrostomy tube evaluation and exchange in 6 months. Michaelle Birks, MD [facility] Radiology Electronically Signed   By: Michaelle Birks M.D.   On: 06/22/2022 16:20   EEG adult  Result Date: 06/22/2022 [place],  Darrick Penna, MD     06/22/2022  7:54 AM [facility] Routine EEG Report Video Performed: Performed Demographics: Patient Name:   Jaylie, Neaves Date of Birth:   [birth year] Identification Number:   MRN - [id number] Study Times: Study Start Time:   06/21/2022 23:13:00 Study End Time:   06/21/2022 23:39:27 Duration:   26 minutes Indication(s): Encephalopathy Technical Summary: This EEG was performed utilizing standard International 10-20 System of electrode placement. Data were obtained and interpreted utilizing referential montage recording, with reformatting to longitudinal, transverse bipolar, and referential montages as necessary for interpretation. State(s):      Drowsy Activation Procedures: Hyperventilation: Not performed Photic Stimulation: Not performed EEG Description: No normal patterns of awake or sleep were present during this recording. Intermittent generalized theta-delta slowing was present. Excess continuous generalized theta-delta slowing was present. Minor muscle, motion, electrode, and eye movement artifacts were occasionally noted. Impression: Abnormal EEG due to:   1) No normal patterns of awake or sleep  2) Intermittent generalized theta-delta slowing  3) Continuous generalized theta-delta slowing  Clinical Correlation: This EEG is suggestive of a moderate encephalopathy, but is nonspecific as to etiology. The absence of  epileptiform abnormalities does not preclude a clinical diagnosis of seizures. [facility] For Inpatient follow-up with [facility] physician please call [facility] [phone]. This is not an outpatient service. Post hospital discharge, please contact hospital directly.     LOS: 19 days   Oren Binet, MD  [facility]    To contact the attending provider between 7A-7P or the covering provider during after hours 7P-7A, please log into the web site [email or web address] and access using universal [place] password for that web site. If you do not have the password, please call the hospital operator.  06/23/2022, 10:39 AM

## 2022-06-23 NOTE — Progress Notes (Signed)
Nutrition Follow-up  DOCUMENTATION CODES:   Not applicable  INTERVENTION:   Tube Feeds via PEG:  Restart, Jevity 1.2 at 60 mL/hr (1440 mL/day) 60 mL ProSource TF20 - daily  160 mL free water q6h Provides 1808 kcal, 100 gm protein, and 1802 mL free water daily. Plan to transition to bolus feeds in a few days a pt tolerates feeds via PEG.   NUTRITION DIAGNOSIS:   Inadequate oral intake related to inability to eat as evidenced by NPO status. - Being addressed via TF  GOAL:   Patient will meet greater than or equal to 90% of their needs - Being met via TF  MONITOR:   Diet advancement, Labs, TF tolerance, I & O's, Weight trends  REASON FOR ASSESSMENT:   Consult Enteral/tube feeding initiation and management  ASSESSMENT:   71 y.o. female presented to the ED with decreased consciousness from SNF. PMH includes CKD IV, MDD, and HTN. Pt admitted with sepsis secondary to PNA, AKI on CKD, acute liver failure, and elevated troponin.  11/22 - Cortrak placed (tip pyloric region) 11/29 - diet advanced to full liquids  12/03 - NPO 12/05 - MBS, recommend NPO 12/08 - Cortrak removed, PEG placed  RD working remotely at time of assessment. Received page and consult to start enteral nutrition via PEG. Pt previously had Cortrak placed for enteral nutrition. Will restart continuous nutrition regimen and adjust to bolus feeds in a few days. Pt weight has remained stable during admission.   Discussed plan with MD to transition to bolus feeds in a few days as pt tolerates continuous feeds via PEG.   Medications reviewed and include: Vitamin D3, Ferrous Sulfate, NovoLog SSI, MVI, Protonix, Miralax Labs reviewed: BUN 54, Creatinine 1.25, 24 hr CBGs 129-183  Diet Order:   Diet Order             Diet NPO time specified  Diet effective midnight                  EDUCATION NEEDS:   Not appropriate for education at this time  Skin:  Skin Assessment: Reviewed RN Assessment  Last BM:   12/8  Height:  Ht Readings from Last 1 Encounters:  06/04/22 _0  (1.676 m)   Weight:  Wt Readings from Last 1 Encounters:  06/23/22 75.5 kg   Ideal Body Weight:  59.1 kg  BMI:  Body mass index is 26.87 kg/m.  Estimated Nutritional Needs:  Kcal:  1700-1900 Protein:  85-100 grams Fluid:  >/= 1.7 L    Hermina Barters RD, LDN Clinical Dietitian See [facility] for contact information.

## 2022-06-24 DIAGNOSIS — A419 Sepsis, unspecified organism: Secondary | ICD-10-CM | POA: Diagnosis not present

## 2022-06-24 DIAGNOSIS — G4733 Obstructive sleep apnea (adult) (pediatric): Secondary | ICD-10-CM | POA: Diagnosis not present

## 2022-06-24 DIAGNOSIS — I480 Paroxysmal atrial fibrillation: Secondary | ICD-10-CM | POA: Diagnosis not present

## 2022-06-24 DIAGNOSIS — J189 Pneumonia, unspecified organism: Secondary | ICD-10-CM | POA: Diagnosis not present

## 2022-06-24 LAB — GLUCOSE, CAPILLARY
Glucose-Capillary: 180 mg/dL — ABNORMAL HIGH (ref 70–99)
Glucose-Capillary: 174 mg/dL — ABNORMAL HIGH (ref 70–99)
Glucose-Capillary: 148 mg/dL — ABNORMAL HIGH (ref 70–99)
Glucose-Capillary: 131 mg/dL — ABNORMAL HIGH (ref 70–99)
Glucose-Capillary: 193 mg/dL — ABNORMAL HIGH (ref 70–99)
Glucose-Capillary: 199 mg/dL — ABNORMAL HIGH (ref 70–99)

## 2022-06-24 MED ORDER — GABAPENTIN 250 MG/5ML PO SOLN
300.0000 mg | Freq: Three times a day (TID) | ORAL | Status: DC
  Administered 2022-06-24: 300 mg
  Filled 2022-06-24 (×2): qty 6

## 2022-06-24 MED ORDER — ACETAMINOPHEN 325 MG PO TABS
650.0000 mg | ORAL_TABLET | Freq: Four times a day (QID) | ORAL | Status: DC | PRN
  Administered 2022-06-30 – 2022-07-01 (×3): 650 mg
  Filled 2022-06-24 (×3): qty 2

## 2022-06-24 MED ORDER — POLYETHYLENE GLYCOL 3350 17 G PO PACK
17.0000 g | PACK | Freq: Two times a day (BID) | ORAL | Status: DC
  Administered 2022-06-25 – 2022-07-03 (×17): 17 g
  Filled 2022-06-24 (×15): qty 1

## 2022-06-24 MED ORDER — ACETAMINOPHEN 650 MG RE SUPP: 650.0000 mg | Freq: Four times a day (QID) | RECTAL | Status: DC | PRN

## 2022-06-24 MED ORDER — GABAPENTIN 250 MG/5ML PO SOLN
100.0000 mg | Freq: Three times a day (TID) | ORAL | Status: DC
  Administered 2022-06-25 – 2022-07-03 (×24): 100 mg
  Filled 2022-06-24 (×29): qty 2

## 2022-06-24 MED ORDER — GABAPENTIN 300 MG PO CAPS: 300.0000 mg | ORAL_CAPSULE | Freq: Three times a day (TID) | ORAL | Status: DC

## 2022-06-24 NOTE — Progress Notes (Signed)
Neurology Progress Note  Brief HPI:  71 year old female nursing home resident with past medical history of CKD, depression, sarcoid, dyslipidemia, and hypertension.  On initial presentation patient was hypoxic with decreased responsiveness and found to have pneumonia as well. Her work up has been significant for sepsis due to to aspiration PNA,  as well she was noted to have dysphagia. Concern for neurosarcoidosis. Of note, she was recently started on Ingrezza for tardive dyskinesia.   Subjective: Patient is lethargic today after changing from carbamazepine to gabapentin  Exam: Vitals:   06/24/22 1600 06/24/22 1933  BP: 121/65 101/63  Pulse: 89 88  Resp: 13 15  Temp:  98.3 F (36.8 C)  SpO2:  98%   Gen: In bed, lethargic Resp: non-labored breathing, no grossly audible wheezing Eyes: clear today.  Cardiac: Perfusing extremities well   Neuro: Awake, lethargic, fully oriented to person place time and situation CN: PERRL, EOM notable for intermittent bilateral incomplete eye adduction, improved upgaze and downgaze on both right and left sides, states both sides of face are equal, blinking and facial movements much reduced from earlier, R NLF flattening, tongue midline Motor: Remains at least 4/5 strength on motor testing, improved effort today Sensation intact to light touch throughout   Pertinent Labs:  Basic Metabolic Panel: Recent Labs  Lab 06/18/22 0827 06/20/22 0402 06/21/22 0723 06/22/22 0316 06/23/22 0152  NA 137 134* 139 138 144  K 4.1 4.9 4.9 4.7 3.9  CL 102 97* 103 101 107  CO2 '26 24 27 26 27  '$ GLUCOSE 164* 231* 276* 167* 120*  BUN 25* 36* 53* 63* 54*  CREATININE 0.90 1.32* 1.38* 1.33* 1.25*  CALCIUM 10.1 10.5* 10.6* 10.6* 10.1     CBC: Recent Labs  Lab 06/18/22 0827 06/20/22 0402 06/21/22 0723 06/22/22 0316  WBC 7.3 17.8* 12.8* 10.5  HGB 12.5 13.2 12.7 12.4  HCT 38.4 40.6 38.3 38.0  MCV 87.9 88.5 88.7 88.2  PLT 259 339 291 310     Coagulation  Studies: Recent Labs    06/22/22 0316  LABPROT 14.8  INR 1.2      Impression: Patient with known sarcoidosis admitted with worsening tardive dyskinesia after stopping benzo, now improved after restarting. Patient with multiple cranial nerve deficits c/f neurosarcoid. Recommended IVMP end date 12/8. No indication for LP given no pathognomonic CSF findings for neurosarcoid and no way to exclude neurologic involvement with CSF. She had some improvement in EOM with steroids but had persistent facial pain favored to be neuropathic / trigeminal neuralgia variant. She was started on carbamazepine but her eliquis was restarted after PEG and is incompatible. I considered both cymbalta and lyrica as alternative agents but given her current mild depression and her hx SI on fluoxetine I did not feel comfortable starting her on an agent that could increase SI (she is not having any SI at this time). I stopped carbamazepine and started gabapentin '300mg'$  tid but she is very lethargic after her first dose so will back down to '100mg'$  tid. Will plan to follow up again tomorrow and make sure she is tolerating this medication and then likely s/o given no further inpatient neurologic workup planned at this time.   Recommendations: - Tranxene 2 times daily (8 AM, 8 PM)  - S/p PEG placement 12/8 - S/p 5 days IVMP end date 12/8 - D/c carbamazepine - Decrease gabapentin to '100mg'$  tid for facial pain - Patient should f/u with outpatient psychiatrist regarding depression - She should f/u neuroimmunology after discharge  regarding question of possible neurosarcoid - Will f/u with patient tmrw  Su Monks, MD [facility] [phone]  If 7pm- 7am, please page neurology on call as listed in [place].

## 2022-06-24 NOTE — Progress Notes (Signed)
PROGRESS NOTE        PATIENT DETAILS Name: Tara [name] Age: 71 y.o. Sex: female Date of Birth: 1950-08-08 Admit Date: 06/04/2022 Admitting Physician Tara Baton, MD [facility]:QPRF, Hunt Oris, MD  Brief Summary: Patient is a 71 y.o.  female with history of sarcoidosis, tardive dyskinesia, CKD stage IIIa, PAF, HTN, HLD, OSA who presented to the hospital on 11/20 with acute hypoxic respiratory failure due to aspiration PNA in the setting of worsening dysphagia/tardive dyskinesia.  She was also found to have multiple cranial nerve deficits-consistent with neurosarcoidosis and started on high-dose steroids.  Unfortunately-in spite of resumption of her benzodiazepine/high-dose IV steroids-she continued to have severe dysphagia and required PEG tube insertion on 12/8.  See below for further details.  Significant events: 11/20>> admit to [facility]-hypoxia-aspiration pneumonia. 12/04>> started on high-dose Solu-Medrol for cranial nerve deficits/secondary to neurosarcoid.  Significant studies: 11/20>> CXR: Left> right PNA 11/20>> CT head: No acute intracranial abnormality 11/21>> echo: EF 55%. 11/29>> MRI brain wo contrast: No acute intracranial abnormality 11/30>> MRI C-spine: No acute abnormality 11/30>> MRI brain w contrast: No pachymeningeal contrast-enhancement 12/01>> CT chest/abdomen/pelvis: No evidence of primary malignancy/metastatic disease 12/08>> EEG: No seizures.  Significant microbiology data: 11/20>> blood culture: No growth 11/20>> urine culture: Multiple species 11/20>> COVID/influenza PCR: Negative  Procedures: 11/22>>Cortrak tube placement 12/08>> PEG tube placed by IR  Consults: Neuro ID  Subjective: No major issues overnight-continues to have some facial pain.  Seems to be tolerating tube feeds-although complains of some mild pain at the PEG tube insertion site.  Objective: Vitals: Blood pressure (!) 151/96, pulse 96, temperature 98.1 F (36.7  C), temperature source Oral, resp. rate 20, height '5\' 6"'$  (1.676 m), weight 76 kg, SpO2 96 %.   Exam: Gen Exam:Alert awake-not in any distress HEENT:atraumatic, normocephalic Chest: B/L clear to auscultation anteriorly CVS:S1S2 regular Abdomen:soft non tender, non distended Extremities:no edema Neurology: Non focal Skin: no rash  Pertinent Labs/Radiology:    Latest Ref Rng & Units 06/22/2022    3:16 AM 06/21/2022    7:23 AM 06/20/2022    4:02 AM  CBC  WBC 4.0 - 10.5 K/uL 10.5  12.8  17.8   Hemoglobin 12.0 - 15.0 g/dL 12.4  12.7  13.2   Hematocrit 36.0 - 46.0 % 38.0  38.3  40.6   Platelets 150 - 400 K/uL 310  291  339     Lab Results  Component Value Date   NA 144 06/23/2022   K 3.9 06/23/2022   CL 107 06/23/2022   CO2 27 06/23/2022      Assessment/Plan: Severe sepsis with acute hypoxic respiratory failure due to aspiration PNA Sepsis physiology has resolved Completed a course of antimicrobial therapy Will be continued today.  Cultures remain negative Unfortunately continues to have severe dysphagia- see below  Oropharyngeal dysphagia Felt to be due to flare of tardive dyskinesia and neurosarcoidosis.   No significant improvement after restarting Tranxene/Ingrezza for tardive dyskinesia and IV steroids for neurosarcoidosis.   PEG tube ultimately placed on 12/8-previously had cortrak tube Seems to be tolerating tube feeds well.  Neurosarcoidosis flare With multiple cranial nerve deficits Completed 5 days of Solu-Medrol   Tardive dyskinesia flare Followed by movement clinic at [place] to be worse after stopping benzos-now overall improved after restarting Tranxene/Ingrezza.  Facial Pain-?Trigeminal Neuralgia Continues to have persistent facial pain-initially improved after starting  Tegretol on 12/07. Discussed with neurology-to reassess today.  AKI on CKD stage IIIa AKI hemodynamically mediated Creatinine level slowly improving Avoid nephrotoxic  agents Repeat lytes periodically  Transaminitis Improved Likely related to sepsis Follow LFTs periodically  Minimally elevated troponin Likely demand ischemia Echo with stable EF  Persistent Afib Remains in Afib for past several days Rate controlled On full dose therapeutic Lovenox Continue Cardizem Discussed with cardiologist-Dr. Percival [name] 112/09-continue flecainide until follow-up with outpatient primary cardiologist.  Note-discussed with neurology on 12/10-since patient on Tegretol-cannot be started on DOAC due to drug interaction, neurology to reassess if Tegretol needs to be continued.  If discontinued-restart Eliquis-if Tegretol will be continued-will need to start Coumadin.  HTN BP stable-continue hydralazine/Cardizem/Imdur  OSA Avoid CPAP given significant issues with dysphagia/oral secretions  Left lower lobe groundglass 4 mm nodule Further workup deferred to the outpatient setting.  Nutrition Status: Nutrition Problem: Inadequate oral intake Etiology: inability to eat Signs/Symptoms: NPO status Interventions: Tube feeding  BMI: Estimated body mass index is 27.04 kg/m as calculated from the following:   Height as of this encounter: '5\' 6"'$  (1.676 m).   Weight as of this encounter: 76 kg.   Code status:   Code Status: Full Code   DVT Prophylaxis: SCDs Start: 06/04/22 2221 Therapeutic Lovenox  Family Communication:  Sister Tara [name] [phone] -updated over the phone 12/8   Disposition Plan: Status is: Inpatient Remains inpatient appropriate because: Severity of illness.   Planned Discharge Destination:Skilled nursing facility   Diet: Diet Order             Diet NPO time specified  Diet effective midnight                     Antimicrobial agents: Anti-infectives (From admission, onward)    Start     Dose/Rate Route Frequency Ordered Stop   06/22/22 0600  ceFAZolin (ANCEF) IVPB 2g/100 mL premix        2 g 200 mL/hr over 30  Minutes Intravenous To Radiology 06/20/22 1400 06/22/22 1259   06/06/22 1730  ceFEPIme (MAXIPIME) 2 g in sodium chloride 0.9 % 100 mL IVPB        2 g 200 mL/hr over 30 Minutes Intravenous Every 12 hours 06/06/22 1627 06/09/22 1649   06/05/22 2200  vancomycin (VANCOREADY) IVPB 750 mg/150 mL  Status:  Discontinued        750 mg 150 mL/hr over 60 Minutes Intravenous Every 24 hours 06/04/22 2328 06/05/22 1019   06/04/22 2330  ceFEPIme (MAXIPIME) 2 g in sodium chloride 0.9 % 100 mL IVPB  Status:  Discontinued        2 g 200 mL/hr over 30 Minutes Intravenous Every 24 hours 06/04/22 2324 06/06/22 1627   06/04/22 2330  vancomycin (VANCOREADY) IVPB 1500 mg/300 mL        1,500 mg 150 mL/hr over 120 Minutes Intravenous  Once 06/04/22 2324 06/05/22 0233   06/04/22 2230  cefTRIAXone (ROCEPHIN) 2 g in sodium chloride 0.9 % 100 mL IVPB  Status:  Discontinued        2 g 200 mL/hr over 30 Minutes Intravenous Every 24 hours 06/04/22 2223 06/04/22 2224   06/04/22 2230  azithromycin (ZITHROMAX) 500 mg in sodium chloride 0.9 % 250 mL IVPB  Status:  Discontinued        500 mg 250 mL/hr over 60 Minutes Intravenous Every 24 hours 06/04/22 2223 06/04/22 2224   06/04/22 1730  cefTRIAXone (ROCEPHIN) 2 g in sodium chloride  0.9 % 100 mL IVPB  Status:  Discontinued        2 g 200 mL/hr over 30 Minutes Intravenous Every 24 hours 06/04/22 1725 06/04/22 2321   06/04/22 1730  azithromycin (ZITHROMAX) 500 mg in sodium chloride 0.9 % 250 mL IVPB  Status:  Discontinued        500 mg 250 mL/hr over 60 Minutes Intravenous Every 24 hours 06/04/22 1725 06/04/22 2321        MEDICATIONS: Scheduled Meds:  brimonidine  1 drop Both Eyes BID   And   timolol  1 drop Both Eyes BID   carbamazepine  200 mg Oral BID   cholecalciferol  1,000 Units Per Tube Daily   clorazepate  7.5 mg Per Tube BID   diltiazem  60 mg Per Tube Q8H   enoxaparin (LOVENOX) injection  75 mg Subcutaneous BID   feeding supplement (PROSource TF20)  60 mL  Per Tube Daily   ferrous sulfate  300 mg Per Tube Q breakfast   flecainide  50 mg Per Tube BID   free water  160 mL Per Tube Q6H   hydrALAZINE  100 mg Per Tube TID   insulin aspart  0-9 Units Subcutaneous Q4H   isosorbide dinitrate  10 mg Per Tube TID   latanoprost  1 drop Both Eyes QHS   multivitamin with minerals  1 tablet Per Tube Daily   pantoprazole (PROTONIX) IV  40 mg Intravenous Q24H   polyethylene glycol  17 g Oral BID   polyvinyl alcohol  1 drop Both Eyes TID   valbenazine  40 mg Oral Daily   Continuous Infusions:  feeding supplement (JEVITY 1.2 CAL) 60 mL/hr at 06/24/22 0400    PRN Meds:.acetaminophen **OR** acetaminophen, ALPRAZolam, melatonin, metoprolol tartrate, morphine injection, oxyCODONE, polyvinyl alcohol   I have personally reviewed following labs and imaging studies  LABORATORY DATA: CBC: Recent Labs  Lab 06/18/22 0827 06/20/22 0402 06/21/22 0723 06/22/22 0316  WBC 7.3 17.8* 12.8* 10.5  HGB 12.5 13.2 12.7 12.4  HCT 38.4 40.6 38.3 38.0  MCV 87.9 88.5 88.7 88.2  PLT 259 339 291 310     Basic Metabolic Panel: Recent Labs  Lab 06/18/22 0827 06/20/22 0402 06/21/22 0723 06/22/22 0316 06/23/22 0152  NA 137 134* 139 138 144  K 4.1 4.9 4.9 4.7 3.9  CL 102 97* 103 101 107  CO2 '26 24 27 26 27  '$ GLUCOSE 164* 231* 276* 167* 120*  BUN 25* 36* 53* 63* 54*  CREATININE 0.90 1.32* 1.38* 1.33* 1.25*  CALCIUM 10.1 10.5* 10.6* 10.6* 10.1     GFR: Estimated Creatinine Clearance: 44.3 mL/min (A) (by C-G formula based on SCr of 1.25 mg/dL (H)).  Liver Function Tests: No results for input(s): "AST", "ALT", "ALKPHOS", "BILITOT", "PROT", "ALBUMIN" in the last 168 hours. No results for input(s): "LIPASE", "AMYLASE" in the last 168 hours. No results for input(s): "AMMONIA" in the last 168 hours.  Coagulation Profile: Recent Labs  Lab 06/22/22 0316  INR 1.2     Cardiac Enzymes: No results for input(s): "CKTOTAL", "CKMB", "CKMBINDEX", "TROPONINI" in  the last 168 hours.   BNP (last 3 results) No results for input(s): "PROBNP" in the last 8760 hours.  Lipid Profile: No results for input(s): "CHOL", "HDL", "LDLCALC", "TRIG", "CHOLHDL", "LDLDIRECT" in the last 72 hours.  Thyroid Function Tests: No results for input(s): "TSH", "T4TOTAL", "FREET4", "T3FREE", "THYROIDAB" in the last 72 hours.  Anemia Panel: No results for input(s): "VITAMINB12", "FOLATE", "FERRITIN", "TIBC", "IRON", "RETICCTPCT"  in the last 72 hours.  Urine analysis:    Component Value Date/Time   COLORURINE AMBER (A) 06/04/2022 2204   APPEARANCEUR HAZY (A) 06/04/2022 2204   LABSPEC 1.015 06/04/2022 2204   PHURINE 5.0 06/04/2022 2204   GLUCOSEU NEGATIVE 06/04/2022 2204   GLUCOSEU NEGATIVE 10/10/2021 1013   HGBUR NEGATIVE 06/04/2022 2204   BILIRUBINUR NEGATIVE 06/04/2022 2204   KETONESUR NEGATIVE 06/04/2022 2204   PROTEINUR 30 (A) 06/04/2022 2204   UROBILINOGEN 0.2 10/10/2021 1013   NITRITE POSITIVE (A) 06/04/2022 2204   LEUKOCYTESUR MODERATE (A) 06/04/2022 2204    Sepsis Labs: Lactic Acid, Venous    Component Value Date/Time   LATICACIDVEN 1.5 06/05/2022 0038    MICROBIOLOGY: No results found for this or any previous visit (from the past 240 hour(s)).  RADIOLOGY STUDIES/RESULTS: IR GASTROSTOMY TUBE MOD SED  Result Date: 06/22/2022 INDICATION: Dysphagia.  CVA. EXAM: PERCUTANEOUS GASTROSTOMY TUBE PLACEMENT COMPARISON:  CT CAP, 06/15/2022. MEDICATIONS: Ancef 2 gm IV; Antibiotics were administered within 1 hour of the procedure. CONTRAST:  15 mL of Isovue 300 administered into the gastric lumen. ANESTHESIA/SEDATION: Moderate (conscious) sedation was employed during this procedure. A total of Versed 1 mg and Fentanyl 50 mcg was administered intravenously. Moderate Sedation Time: 13 minutes. The patient's level of consciousness and vital signs were monitored continuously by radiology nursing throughout the procedure under my direct supervision. FLUOROSCOPY TIME:   Fluoroscopic dose; 2 mGy COMPLICATIONS: None immediate. PROCEDURE: Informed written consent was obtained from the patient and/or patient's representative following explanation of the procedure, risks, benefits and alternatives. A time out was performed prior to the initiation of the procedure. Maximal barrier sterile technique utilized including caps, mask, sterile gowns, sterile gloves, large sterile drape, hand hygiene and Betadine prep. The left upper quadrant was sterilely prepped and draped. A oral gastric catheter was inserted into the stomach under fluoroscopy. The existing nasogastric feeding tube was removed. The left costal margin and barium opacified transverse colon were identified and avoided. Air was injected into the stomach for insufflation and visualization under fluoroscopy. Under sterile conditions and local anesthesia, 2 T tacks were utilized to pexy the anterior aspect of the stomach against the ventral abdominal wall. Contrast injection confirmed appropriate positioning of each of the T tacks. An incision was made between the T tacks and a 17 gauge trocar needle was utilized to access the stomach. Needle position was confirmed within the stomach with aspiration of air and injection of a small amount of contrast. A stiff Glidewire was advanced into the gastric lumen and under intermittent fluoroscopic guidance, the access needle was exchanged for a Kumpe catheter. With the use of the Kumpe catheter, a stiff Glidewire was advanced into the horizontal segment of the duodenum. Under intermittent fluoroscopic guidance, the Kumpe catheter was exchanged for a telescoping peel-away sheath, ultimately allowing placement of a 18-French balloon retention gastrostomy tube. The retention balloon was insufflated with a mixture of dilute saline and contrast and pulled taut against the anterior wall of the stomach. The external disc was cinched. Contrast injection confirms positioning within the stomach.  Several spot radiographic images were obtained in various obliquities for documentation. T he patient tolerated procedure well without immediate post procedural complication. FINDINGS: After successful fluoroscopic guided placement, the gastrostomy tube is appropriately positioned with internal retention balloon against the ventral aspect of the gastric lumen. IMPRESSION: Successful insertion of an 18 Fr balloon retention gastrostomy tube. The gastrostomy may be used immediately for medication administration and in 24 hrs for the initiation  of feeds. PLAN: Patient will return to V[facility]iology ([facility]) for routine gastrostomy tube evaluation and exchange in 6 months. Michaelle Birks, MD [facility] Radiology Electronically Signed   By: Michaelle Birks M.D.   On: 06/22/2022 16:20     LOS: 20 days   Oren Binet, MD  [facility]    To contact the attending provider between 7A-7P or the covering provider during after hours 7P-7A, please log into the web site [email or web address] and access using universal [place] password for that web site. If you do not have the password, please call the hospital operator.  06/24/2022, 10:18 AM

## 2022-06-25 DIAGNOSIS — I10 Essential (primary) hypertension: Secondary | ICD-10-CM

## 2022-06-25 LAB — BASIC METABOLIC PANEL
Anion gap: 10 (ref 5–15)
BUN: 48 mg/dL — ABNORMAL HIGH (ref 8–23)
CO2: 26 mmol/L (ref 22–32)
Calcium: 9.3 mg/dL (ref 8.9–10.3)
Chloride: 110 mmol/L (ref 98–111)
Creatinine, Ser: 1.1 mg/dL — ABNORMAL HIGH (ref 0.44–1.00)
GFR, Estimated: 54 mL/min — ABNORMAL LOW (ref >60)
Glucose, Bld: 194 mg/dL — ABNORMAL HIGH (ref 70–99)
Potassium: 4 mmol/L (ref 3.5–5.1)
Sodium: 146 mmol/L — ABNORMAL HIGH (ref 135–145)

## 2022-06-25 LAB — GLUCOSE, CAPILLARY
Glucose-Capillary: 186 mg/dL — ABNORMAL HIGH (ref 70–99)
Glucose-Capillary: 227 mg/dL — ABNORMAL HIGH (ref 70–99)
Glucose-Capillary: 192 mg/dL — ABNORMAL HIGH (ref 70–99)
Glucose-Capillary: 166 mg/dL — ABNORMAL HIGH (ref 70–99)
Glucose-Capillary: 185 mg/dL — ABNORMAL HIGH (ref 70–99)

## 2022-06-25 LAB — ZINC: Zinc: 70 ug/dL (ref 44–115)

## 2022-06-25 LAB — COPPER, SERUM: Copper: 117 ug/dL (ref 80–158)

## 2022-06-25 MED ORDER — APIXABAN 5 MG PO TABS
5.0000 mg | ORAL_TABLET | Freq: Two times a day (BID) | ORAL | Status: DC
  Administered 2022-06-25 (×2): 5 mg via ORAL
  Filled 2022-06-25 (×2): qty 1

## 2022-06-25 NOTE — Progress Notes (Addendum)
Neurology Progress Note  Brief HPI:  71 year old female nursing home resident with past medical history of CKD, depression, sarcoid, dyslipidemia, and hypertension.  On initial presentation patient was hypoxic with decreased responsiveness and found to have pneumonia as well. Her work up has been significant for sepsis due to to aspiration PNA. As well, she was noted to have dysphagia. Concern for neurosarcoidosis. Of note, she was recently started on Ingrezza for tardive dyskinesia.   Subjective: Improved lethargy after decreasing gabapentin. She is alert, orients, interacts and follows all commands.   Exam: Vitals:   06/25/22 0800 06/25/22 0826  BP: (!) 145/91   Pulse: 84 82  Resp: 12 16  Temp:    SpO2: 96% 96%   Gen: No acute distress; sitting up to chair Resp: non-labored breathing, no grossly audible wheezing Eyes: clear today.  Cardiac: Perfusing extremities well   Neuro: Awake, interactive, oriented to person place time and situation CN: PERRL, EOM notable for intermittent bilateral incomplete eye adduction, improved upgaze and downgaze on both right and left sides, states both sides of face are equal, blinking and facial movements much reduced from earlier, R NLF flattening, tongue midline Motor: Remains at least 4/5 strength on motor testing, improved effort today Sensation intact to light touch throughout   Pertinent Labs:  Basic Metabolic Panel: Recent Labs  Lab 06/20/22 0402 06/21/22 0723 06/22/22 0316 06/23/22 0152 06/25/22 0321  NA 134* 139 138 144 146*  K 4.9 4.9 4.7 3.9 4.0  CL 97* 103 101 107 110  CO2 '24 27 26 27 26  '$ GLUCOSE 231* 276* 167* 120* 194*  BUN 36* 53* 63* 54* 48*  CREATININE 1.32* 1.38* 1.33* 1.25* 1.10*  CALCIUM 10.5* 10.6* 10.6* 10.1 9.3     CBC: Recent Labs  Lab 06/20/22 0402 06/21/22 0723 06/22/22 0316  WBC 17.8* 12.8* 10.5  HGB 13.2 12.7 12.4  HCT 40.6 38.3 38.0  MCV 88.5 88.7 88.2  PLT 339 291 310     Coagulation  Studies: No results for input(s): "LABPROT", "INR" in the last 72 hours.    Assessment: 71 year old female with known sarcoidosis admitted with worsening tardive dyskinesia after stopping benzo, now improved after restarting.  - Patient with multiple cranial nerve deficits c/f neurosarcoid.  - Recommended IVMP. End date was 12/8.  - No indication for LP given no pathognomonic CSF findings for neurosarcoid and no way to exclude neurologic involvement with CSF.  - She had some improvement in EOM with steroids. - Persistent facial pain favored to be neuropathic / trigeminal neuralgia variant. She was started on carbamazepine but her eliquis was restarted after PEG and is incompatible; carbamazepine was discontinued. We have considered both cymbalta and lyrica as alternative agents but given her current mild depression and her hx SI on fluoxetine we did not feel comfortable starting her on an agent that could increase SI (she is not having any SI at this time). We stopped carbamazepine and started gabapentin '300mg'$  tid but she is very lethargic after her first dose so will back down to 100 mg tid.   Recommendations: - Tranxene (clorazepate) 2 times daily (8 AM, 8 PM). Titrate based on balance between side effect of sedation and therapeutic goal of minimization of her tardive dyskinesia - Continue with lower-dose gabapentin at 100 mg tid for facial pain - Patient should f/u with outpatient psychiatrist regarding depression - She should f/u with neuroimmunology after discharge regarding question of possible neurosarcoid - Will s/o, please call back if needed.  Desiree Metzger-Cihelka, ARNP-C, ANVP-BC [facility] If 7pm- 7am, please page neurology on call as listed in [place].  Electronically signed: Dr. Kerney Elbe

## 2022-06-25 NOTE — TOC Progression Note (Signed)
Transition of Care [facility]) - Progression Note    Patient Details  Name: Arnelle Nale MRN: [id number] Date of Birth: 02/07/1951  [facility] CM/SW [place], [place] Phone Number: 06/25/2022, 9:01 AM  Clinical Narrative:    FL2 updated with PEG info and faxed out to SNFs for review.    Expected Discharge Plan: [place] Barriers to Discharge: Continued Medical Work up, SNF Pending bed offer, Ship broker  Expected Discharge Plan and Services Expected Discharge Plan: [place] In-house Referral: Clinical Social Work   Post Acute Care Choice: [place] Living arrangements for the past 2 months: [place] Determinants of Health (SDOH) Interventions    Readmission Risk Interventions    06/14/2022    4:37 PM  Readmission Risk Prevention Plan  Transportation Screening Complete  Medication Review [employer]) Complete  PCP or Specialist appointment within 3-5 days of discharge Complete  HRI or [place] Complete  SW Recovery Care/Counseling Consult Complete  Palliative Care Screening Not [place] Complete

## 2022-06-25 NOTE — Plan of Care (Signed)
  Problem: Education: Goal: Knowledge of General Education information will improve Description: Including pain rating scale, medication(s)/side effects and non-pharmacologic comfort measures Outcome: Progressing   Problem: Health Behavior/Discharge Planning: Goal: Ability to manage health-related needs will improve Outcome: Progressing   Problem: Clinical Measurements: Goal: Ability to maintain clinical measurements within normal limits will improve Outcome: Progressing Goal: Will remain free from infection Outcome: Progressing Goal: Diagnostic test results will improve Outcome: Progressing Goal: Respiratory complications will improve Outcome: Progressing Goal: Cardiovascular complication will be avoided Outcome: Progressing   Problem: Activity: Goal: Risk for activity intolerance will decrease Outcome: Progressing   Problem: Nutrition: Goal: Adequate nutrition will be maintained Outcome: Progressing   Problem: Coping: Goal: Level of anxiety will decrease Outcome: Progressing   Problem: Elimination: Goal: Will not experience complications related to bowel motility Outcome: Progressing Goal: Will not experience complications related to urinary retention Outcome: Progressing   Problem: Pain Managment: Goal: General experience of comfort will improve Outcome: Progressing   Problem: Safety: Goal: Ability to remain free from injury will improve Outcome: Progressing   Problem: Skin Integrity: Goal: Risk for impaired skin integrity will decrease Outcome: Progressing   Problem: Education: Goal: Ability to describe self-care measures that may prevent or decrease complications (Diabetes Survival Skills Education) will improve Outcome: Progressing   Problem: Coping: Goal: Ability to adjust to condition or change in health will improve Outcome: Progressing   Problem: Fluid Volume: Goal: Ability to maintain a balanced intake and output will improve Outcome:  Progressing   Problem: Health Behavior/Discharge Planning: Goal: Ability to identify and utilize available resources and services will improve Outcome: Progressing Goal: Ability to manage health-related needs will improve Outcome: Progressing   Problem: Metabolic: Goal: Ability to maintain appropriate glucose levels will improve Outcome: Progressing   Problem: Nutritional: Goal: Maintenance of adequate nutrition will improve Outcome: Progressing Goal: Progress toward achieving an optimal weight will improve Outcome: Progressing   Problem: Skin Integrity: Goal: Risk for impaired skin integrity will decrease Outcome: Progressing

## 2022-06-25 NOTE — Progress Notes (Signed)
Physical Therapy Treatment Patient Details Name: Tara [name] MRN: [id number] DOB: 1951/02/26 Today's Date: 06/25/2022   History of Present Illness Pt presented 11/20 with acute hypoxic respiratory failure due to aspiration PNA in the setting of worsening dysphagia/tardive dyskinesia.  She was also found to have multiple cranial nerve deficits-consistent with neurosarcoidosis and started on high-dose steroids.  Unfortunately-in spite of resumption of her benzodiazepine/high-dose IV steroids-she continued to have severe dysphagia and required PEG tube insertion on 12/8   PMH significant for CKD, sarcoidosis, HTN, obesity,OSA on CPAP, atrial fibrillation on Eliquis, hx of presyncope seen in ed 3 x since 8/29.    PT Comments    Pt fatigued after up in chair for several hours. Assisted back to bed using Stedy. Continue to recommend return to SNF.    Recommendations for follow up therapy are one component of a multi-disciplinary discharge planning process, led by the attending physician.  Recommendations may be updated based on patient status, additional functional criteria and insurance authorization.  Follow Up Recommendations  Skilled nursing-short term rehab (<3 hours/day) Can patient physically be transported by private vehicle: No   Assistance Recommended at Discharge Frequent or constant Supervision/Assistance  Patient can return home with the following Assist for transportation;A lot of help with bathing/dressing/bathroom;Help with stairs or ramp for entrance;Two people to help with walking and/or transfers   Equipment Recommendations  None recommended by PT    Recommendations for Other Services       Precautions / Restrictions Precautions Precautions: Fall;Other (comment) Precaution Comments: PEG Restrictions Weight Bearing Restrictions: No     Mobility  Bed Mobility Overal bed mobility: Needs Assistance Bed Mobility: Sit to Supine     Supine to sit: Mod assist, +2 for  physical assistance Sit to supine: +2 for physical assistance, Max assist   General bed mobility comments: Assist to lower trunk and and bring legs up into bed.    Transfers Overall transfer level: Needs assistance Equipment used: Ambulation equipment used Transfers: Sit to/from Stand, Bed to chair/wheelchair/BSC Sit to Stand: Max assist, +2 physical assistance           General transfer comment: Assisted to stand from bed with walker without success. +2 max assist to stand with Stedy using bed pad under hips to bring hips up. Transfer via Lift Equipment: Stedy  Ambulation/Gait                   Stairs             Wheelchair Mobility    Modified Rankin (Stroke Patients Only)       Balance Overall balance assessment: Needs assistance Sitting-balance support: Feet unsupported, Bilateral upper extremity supported Sitting balance-Leahy Scale: Poor Sitting balance - Comments: min assist to sit EOB Postural control: Posterior lean Standing balance support: Bilateral upper extremity supported, Reliant on assistive device for balance Standing balance-Leahy Scale: Zero Standing balance comment: +2 max assist using Stedy and bed pad under hips.                            Cognition Arousal/Alertness: Awake/alert Behavior During Therapy: Flat affect Overall Cognitive Status: No family/caregiver present to determine baseline cognitive functioning Area of Impairment: Attention, Safety/judgement, Awareness, Following commands                   Current Attention Level: Sustained   Following Commands: Follows one step commands consistently Safety/Judgement: Decreased awareness of deficits, Decreased awareness of  safety   Problem Solving: Slow processing, Requires verbal cues, Requires tactile cues          Exercises      General Comments        Pertinent Vitals/Pain Pain Assessment Pain Location: "my face" Pain Descriptors /  Indicators: Discomfort, Constant Pain Intervention(s): Limited activity within patient's tolerance    Home Living                          Prior Function            PT Goals (current goals can now be found in the care plan section) Acute Rehab PT Goals Patient Stated Goal: to find new rehab facility Progress towards PT goals: Not progressing toward goals - comment    Frequency    Min 2X/week      PT Plan Current plan remains appropriate    Co-evaluation              AM-PAC PT "6 Clicks" Mobility   Outcome Measure  Help needed turning from your back to your side while in a flat bed without using bedrails?: A Lot Help needed moving from lying on your back to sitting on the side of a flat bed without using bedrails?: Total Help needed moving to and from a bed to a chair (including a wheelchair)?: Total Help needed standing up from a chair using your arms (e.g., wheelchair or bedside chair)?: Total Help needed to walk in hospital room?: Total Help needed climbing 3-5 steps with a railing? : Total 6 Click Score: 7    End of Session Equipment Utilized During Treatment: Gait belt Activity Tolerance: Patient limited by fatigue Patient left: in bed;with nursing/sitter in room (Nurse tech present with patient) Nurse Communication: Mobility status;Need for lift equipment PT Visit Diagnosis: Other abnormalities of gait and mobility (R26.89);Muscle weakness (generalized) (M62.81)     Time: 8110-3159 PT Time Calculation (min) (ACUTE ONLY): 12 min  Charges:  $Therapeutic Activity: 8-22 mins                     [place] 06/25/2022, 3:15 PM
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
## 2022-06-25 NOTE — Progress Notes (Signed)
Physical Therapy Treatment Patient Details Name: Tara [name] MRN: [id number] DOB: 10-17-1950 Today's Date: 06/25/2022   History of Present Illness Pt presented 11/20 with acute hypoxic respiratory failure due to aspiration PNA in the setting of worsening dysphagia/tardive dyskinesia.  She was also found to have multiple cranial nerve deficits-consistent with neurosarcoidosis and started on high-dose steroids.  Unfortunately-in spite of resumption of her benzodiazepine/high-dose IV steroids-she continued to have severe dysphagia and required PEG tube insertion on 12/8   PMH significant for CKD, sarcoidosis, HTN, obesity,OSA on CPAP, atrial fibrillation on Eliquis, hx of presyncope seen in ed 3 x since 8/29.    PT Comments    Pt requiring more assist for mobility and sitting balance today. Used Stedy to get from bed to chair but unable to stand with walker.  Continue to recommend return to SNF at DC.   Recommendations for follow up therapy are one component of a multi-disciplinary discharge planning process, led by the attending physician.  Recommendations may be updated based on patient status, additional functional criteria and insurance authorization.  Follow Up Recommendations  Skilled nursing-short term rehab (<3 hours/day) Can patient physically be transported by private vehicle: No   Assistance Recommended at Discharge Frequent or constant Supervision/Assistance  Patient can return home with the following Assist for transportation;A lot of help with bathing/dressing/bathroom;Help with stairs or ramp for entrance;Two people to help with walking and/or transfers   Equipment Recommendations  None recommended by PT    Recommendations for Other Services       Precautions / Restrictions Precautions Precautions: Fall;Other (comment) Precaution Comments: PEG Restrictions Weight Bearing Restrictions: No     Mobility  Bed Mobility Overal bed mobility: Needs Assistance Bed Mobility:  Supine to Sit     Supine to sit: Mod assist, +2 for physical assistance     General bed mobility comments: Assist to bring legs off of bed, elevate trunk into sitting and bring hips to EOB    Transfers Overall transfer level: Needs assistance Equipment used: Ambulation equipment used Transfers: Sit to/from Stand, Bed to chair/wheelchair/BSC Sit to Stand: Max assist, +2 physical assistance           General transfer comment: Assisted to stand from bed with walker without success. +2 max assist to stand with Stedy using bed pad under hips to bring hips up. Transfer via Lift Equipment: Stedy  Ambulation/Gait                   Stairs             Wheelchair Mobility    Modified Rankin (Stroke Patients Only)       Balance Overall balance assessment: Needs assistance Sitting-balance support: Feet unsupported, Bilateral upper extremity supported Sitting balance-Leahy Scale: Poor Sitting balance - Comments: Pt leaning rt and required mod assist to initially correct. Then sat min guard to min assist Postural control: Posterior lean, Right lateral lean Standing balance support: Bilateral upper extremity supported, Reliant on assistive device for balance Standing balance-Leahy Scale: Zero Standing balance comment: +2 max assist using Stedy and bed pad under hips.                            Cognition Arousal/Alertness: Awake/alert Behavior During Therapy: Flat affect Overall Cognitive Status: No family/caregiver present to determine baseline cognitive functioning Area of Impairment: Attention, Safety/judgement, Awareness, Following commands  Current Attention Level: Sustained   Following Commands: Follows one step commands consistently Safety/Judgement: Decreased awareness of deficits, Decreased awareness of safety   Problem Solving: Slow processing, Requires verbal cues, Requires tactile cues          Exercises       General Comments        Pertinent Vitals/Pain Pain Assessment Pain Location: "my face" Pain Descriptors / Indicators: Discomfort, Constant Pain Intervention(s): Limited activity within patient's tolerance    Home Living                          Prior Function            PT Goals (current goals can now be found in the care plan section) Acute Rehab PT Goals Patient Stated Goal: to find new rehab facility Progress towards PT goals: Not progressing toward goals - comment    Frequency    Min 2X/week      PT Plan Current plan remains appropriate    Co-evaluation              AM-PAC PT "6 Clicks" Mobility   Outcome Measure  Help needed turning from your back to your side while in a flat bed without using bedrails?: A Lot Help needed moving from lying on your back to sitting on the side of a flat bed without using bedrails?: Total Help needed moving to and from a bed to a chair (including a wheelchair)?: Total Help needed standing up from a chair using your arms (e.g., wheelchair or bedside chair)?: Total Help needed to walk in hospital room?: Total Help needed climbing 3-5 steps with a railing? : Total 6 Click Score: 7    End of Session Equipment Utilized During Treatment: Gait belt Activity Tolerance: Patient limited by fatigue Patient left: in chair;with call bell/phone within reach;with chair alarm set Nurse Communication: Mobility status;Need for lift equipment PT Visit Diagnosis: Other abnormalities of gait and mobility (R26.89);Muscle weakness (generalized) (M62.81)     Time: 1751-0258 PT Time Calculation (min) (ACUTE ONLY): 21 min  Charges:  $Therapeutic Activity: 8-22 mins                     [place] 06/25/2022, 2:40 PM

## 2022-06-25 NOTE — TOC Progression Note (Addendum)
Transition of Care [facility]) - Progression Note    Patient Details  Name: Tara [name] MRN: [id number] Date of Birth: 08-Sep-1950  Transition of Care [facility]) CM/SW [place], LCSW Phone Number: 06/25/2022, 12:20 PM  Clinical Narrative:    12:20pm-CSW left voicemail for patient's Elder Advocate, Anderson Malta, to go over SNF options since [facility] has declined.   2pm-CSW received return call from [place]. CSW emailed her the patient's bed offers as requested. She requested CSW contact:  -[place]: Spoke with Admissions and they are unable to accept her insurance -[place]: faxed referral -[facility]ynn Serve: Left vm   Expected Discharge Plan: [place] Barriers to Discharge: Continued Medical Work up, SNF Pending bed offer, Ship broker  Expected Discharge Plan and Services Expected Discharge Plan: [place] In-house Referral: Clinical Social Work   Post Acute Care Choice: House Living arrangements for the past 2 months: [place] Determinants of Health (SDOH) Interventions    Readmission Risk Interventions    06/14/2022    4:37 PM  Readmission Risk Prevention Plan  Transportation Screening Complete  Medication Review Press photographer) Complete  PCP or Specialist appointment within 3-5 days of discharge Complete  HRI or Home Care Consult Complete  SW Recovery Care/Counseling Consult Complete  [place] Complete

## 2022-06-25 NOTE — Progress Notes (Signed)
PROGRESS NOTE        PATIENT DETAILS Name: Tara [name] Age: 71 y.o. Sex: female Date of Birth: 05-Mar-1951 Admit Date: 06/04/2022 Admitting Physician Quintella Baton, MD [id number], Tara Oris, MD  Brief Summary: Patient is a 71 y.o.  female with history of sarcoidosis, tardive dyskinesia, CKD stage IIIa, PAF, HTN, HLD, OSA who presented to the hospital on 11/20 with acute hypoxic respiratory failure due to aspiration PNA in the setting of worsening dysphagia/tardive dyskinesia.  She was also found to have multiple cranial nerve deficits-consistent with neurosarcoidosis and started on high-dose steroids.  Unfortunately-in spite of resumption of her benzodiazepine/high-dose IV steroids-she continued to have severe dysphagia and required PEG tube insertion on 12/8.  See below for further details.  Significant events: 11/20>> admit to [facility]-hypoxia-aspiration pneumonia. 12/04>> started on high-dose Solu-Medrol for cranial nerve deficits/secondary to neurosarcoid.  Significant studies: 11/20>> CXR: Left> right PNA 11/20>> CT head: No acute intracranial abnormality 11/21>> echo: EF 55%. 11/29>> MRI brain wo contrast: No acute intracranial abnormality 11/30>> MRI C-spine: No acute abnormality 11/30>> MRI brain w contrast: No pachymeningeal contrast-enhancement 12/01>> CT chest/abdomen/pelvis: No evidence of primary malignancy/metastatic disease 12/08>> EEG: No seizures.  Significant microbiology data: 11/20>> blood culture: No growth 11/20>> urine culture: Multiple species 11/20>> COVID/influenza PCR: Negative  Procedures: 11/22>>Cortrak tube placement 12/08>> PEG tube placed by IR  Consults: Neuro ID  Subjective: Pain and PEG tube site is much better.  Continues to have intermittent facial pain.  Appears comfortable.  Objective: Vitals: Blood pressure (!) 95/56, pulse 67, temperature 98.2 F (36.8 C), temperature source Axillary, resp. rate 18, height '5\' 6"'$   (1.676 m), weight 76.4 kg, SpO2 97 %.   Exam: Gen Exam:Alert awake-not in any distress HEENT:atraumatic, normocephalic Chest: B/L clear to auscultation anteriorly CVS:S1S2 regular Abdomen:soft non tender, non distended Extremities:no edema Neurology: Non focal-generalized weakness Skin: no rash  Pertinent Labs/Radiology:    Latest Ref Rng & Units 06/22/2022    3:16 AM 06/21/2022    7:23 AM 06/20/2022    4:02 AM  CBC  WBC 4.0 - 10.5 K/uL 10.5  12.8  17.8   Hemoglobin 12.0 - 15.0 g/dL 12.4  12.7  13.2   Hematocrit 36.0 - 46.0 % 38.0  38.3  40.6   Platelets 150 - 400 K/uL 310  291  339     Lab Results  Component Value Date   NA 146 (H) 06/25/2022   K 4.0 06/25/2022   CL 110 06/25/2022   CO2 26 06/25/2022      Assessment/Plan: Severe sepsis with acute hypoxic respiratory failure due to aspiration PNA Sepsis physiology has resolved Completed a course of antimicrobial therapy Will be continued today.  Cultures remain negative Unfortunately continues to have severe dysphagia- see below  Oropharyngeal dysphagia Felt to be due to flare of tardive dyskinesia and neurosarcoidosis.   No significant improvement after restarting Tranxene/Ingrezza for tardive dyskinesia and IV steroids for neurosarcoidosis.   PEG tube ultimately placed on 12/8-previously had cortrak tube Seems to be tolerating tube feeds well.  Neurosarcoidosis flare With multiple cranial nerve deficits Completed 5 days of Solu-Medrol   Tardive dyskinesia flare Followed by movement clinic at [facility] to be worse after stopping benzos-now overall improved after restarting Tranxene/Ingrezza.  Facial Pain-?Trigeminal Neuralgia Continues to have persistent pain-initially improved on Tegretol but was discontinued on 12/10 and started on  Neurontin.   Neurology continues to follow-will await further recommendations.  AKI on CKD stage IIIa AKI hemodynamically mediated Creatinine level slowly  improving Avoid nephrotoxic agents Repeat lytes periodically  Transaminitis Improved Likely related to sepsis Follow LFTs periodically  Minimally elevated troponin Likely demand ischemia Echo with stable EF  Persistent Afib Remains in Afib for past several days Rate controlled Continue Cardizem Since no longer on Tegretol-restart Eliquis-stop therapeutic Lovenox. Discussed with cardiologist-Dr. Percival Spanish 112/09-continue flecainide until follow-up with outpatient primary cardiologist.  HTN BP stable-continue hydralazine/Cardizem/Imdur  OSA Avoid CPAP given significant issues with dysphagia/oral secretions  Left lower lobe groundglass 4 mm nodule Further workup deferred to the outpatient setting.  Nutrition Status: Nutrition Problem: Inadequate oral intake Etiology: inability to eat Signs/Symptoms: NPO status Interventions: Tube feeding  BMI: Estimated body mass index is 27.19 kg/m as calculated from the following:   Height as of this encounter: '5\' 6"'$  (1.676 m).   Weight as of this encounter: 76.4 kg.   Code status:   Code Status: Full Code   DVT Prophylaxis: SCDs Start: 06/04/22 2221 apixaban (ELIQUIS) tablet 5 mg Therapeutic Lovenox  Family Communication:  Sister Tara [name] [phone] -updated over the phone 12/8   Disposition Plan: Status is: Inpatient Remains inpatient appropriate because: Severity of illness.   Planned Discharge Destination:Skilled nursing facility probably in the next 1-2 days.   Diet: Diet Order             Diet NPO time specified  Diet effective midnight                     Antimicrobial agents: Anti-infectives (From admission, onward)    Start     Dose/Rate Route Frequency Ordered Stop   06/22/22 0600  ceFAZolin (ANCEF) IVPB 2g/100 mL premix        2 g 200 mL/hr over 30 Minutes Intravenous To Radiology 06/20/22 1400 06/22/22 1259   06/06/22 1730  ceFEPIme (MAXIPIME) 2 g in sodium chloride 0.9 % 100 mL IVPB         2 g 200 mL/hr over 30 Minutes Intravenous Every 12 hours 06/06/22 1627 06/09/22 1649   06/05/22 2200  vancomycin (VANCOREADY) IVPB 750 mg/150 mL  Status:  Discontinued        750 mg 150 mL/hr over 60 Minutes Intravenous Every 24 hours 06/04/22 2328 06/05/22 1019   06/04/22 2330  ceFEPIme (MAXIPIME) 2 g in sodium chloride 0.9 % 100 mL IVPB  Status:  Discontinued        2 g 200 mL/hr over 30 Minutes Intravenous Every 24 hours 06/04/22 2324 06/06/22 1627   06/04/22 2330  vancomycin (VANCOREADY) IVPB 1500 mg/300 mL        1,500 mg 150 mL/hr over 120 Minutes Intravenous  Once 06/04/22 2324 06/05/22 0233   06/04/22 2230  cefTRIAXone (ROCEPHIN) 2 g in sodium chloride 0.9 % 100 mL IVPB  Status:  Discontinued        2 g 200 mL/hr over 30 Minutes Intravenous Every 24 hours 06/04/22 2223 06/04/22 2224   06/04/22 2230  azithromycin (ZITHROMAX) 500 mg in sodium chloride 0.9 % 250 mL IVPB  Status:  Discontinued        500 mg 250 mL/hr over 60 Minutes Intravenous Every 24 hours 06/04/22 2223 06/04/22 2224   06/04/22 1730  cefTRIAXone (ROCEPHIN) 2 g in sodium chloride 0.9 % 100 mL IVPB  Status:  Discontinued        2 g 200 mL/hr over 30  Minutes Intravenous Every 24 hours 06/04/22 1725 06/04/22 2321   06/04/22 1730  azithromycin (ZITHROMAX) 500 mg in sodium chloride 0.9 % 250 mL IVPB  Status:  Discontinued        500 mg 250 mL/hr over 60 Minutes Intravenous Every 24 hours 06/04/22 1725 06/04/22 2321        MEDICATIONS: Scheduled Meds:  apixaban  5 mg Oral BID   brimonidine  1 drop Both Eyes BID   And   timolol  1 drop Both Eyes BID   cholecalciferol  1,000 Units Per Tube Daily   clorazepate  7.5 mg Per Tube BID   diltiazem  60 mg Per Tube Q8H   feeding supplement (PROSource TF20)  60 mL Per Tube Daily   ferrous sulfate  300 mg Per Tube Q breakfast   flecainide  50 mg Per Tube BID   free water  160 mL Per Tube Q6H   gabapentin  100 mg Per Tube TID   hydrALAZINE  100 mg Per Tube TID    insulin aspart  0-9 Units Subcutaneous Q4H   isosorbide dinitrate  10 mg Per Tube TID   latanoprost  1 drop Both Eyes QHS   multivitamin with minerals  1 tablet Per Tube Daily   pantoprazole (PROTONIX) IV  40 mg Intravenous Q24H   polyethylene glycol  17 g Per Tube BID   polyvinyl alcohol  1 drop Both Eyes TID   valbenazine  40 mg Oral Daily   Continuous Infusions:  feeding supplement (JEVITY 1.2 CAL) 1,000 mL (06/24/22 1205)    PRN Meds:.acetaminophen **OR** acetaminophen, ALPRAZolam, melatonin, metoprolol tartrate, morphine injection, oxyCODONE, polyvinyl alcohol   I have personally reviewed following labs and imaging studies  LABORATORY DATA: CBC: Recent Labs  Lab 06/20/22 0402 06/21/22 0723 06/22/22 0316  WBC 17.8* 12.8* 10.5  HGB 13.2 12.7 12.4  HCT 40.6 38.3 38.0  MCV 88.5 88.7 88.2  PLT 339 291 310     Basic Metabolic Panel: Recent Labs  Lab 06/20/22 0402 06/21/22 0723 06/22/22 0316 06/23/22 0152 06/25/22 0321  NA 134* 139 138 144 146*  K 4.9 4.9 4.7 3.9 4.0  CL 97* 103 101 107 110  CO2 '24 27 26 27 26  '$ GLUCOSE 231* 276* 167* 120* 194*  BUN 36* 53* 63* 54* 48*  CREATININE 1.32* 1.38* 1.33* 1.25* 1.10*  CALCIUM 10.5* 10.6* 10.6* 10.1 9.3     GFR: Estimated Creatinine Clearance: 50.4 mL/min (A) (by C-G formula based on SCr of 1.1 mg/dL (H)).  Liver Function Tests: No results for input(s): "AST", "ALT", "ALKPHOS", "BILITOT", "PROT", "ALBUMIN" in the last 168 hours. No results for input(s): "LIPASE", "AMYLASE" in the last 168 hours. No results for input(s): "AMMONIA" in the last 168 hours.  Coagulation Profile: Recent Labs  Lab 06/22/22 0316  INR 1.2     Cardiac Enzymes: No results for input(s): "CKTOTAL", "CKMB", "CKMBINDEX", "TROPONINI" in the last 168 hours.   BNP (last 3 results) No results for input(s): "PROBNP" in the last 8760 hours.  Lipid Profile: No results for input(s): "CHOL", "HDL", "LDLCALC", "TRIG", "CHOLHDL", "LDLDIRECT" in  the last 72 hours.  Thyroid Function Tests: No results for input(s): "TSH", "T4TOTAL", "FREET4", "T3FREE", "THYROIDAB" in the last 72 hours.  Anemia Panel: No results for input(s): "VITAMINB12", "FOLATE", "FERRITIN", "TIBC", "IRON", "RETICCTPCT" in the last 72 hours.  Urine analysis:    Component Value Date/Time   COLORURINE AMBER (A) 06/04/2022 2204   APPEARANCEUR HAZY (A) 06/04/2022 2204  LABSPEC 1.015 06/04/2022 2204   PHURINE 5.0 06/04/2022 2204   GLUCOSEU NEGATIVE 06/04/2022 2204   GLUCOSEU NEGATIVE 10/10/2021 [place] 06/04/2022 [place] 06/04/2022 [place] 06/04/2022 2204   PROTEINUR 30 (A) 06/04/2022 2204   UROBILINOGEN 0.2 10/10/2021 1013   NITRITE POSITIVE (A) 06/04/2022 2204   LEUKOCYTESUR MODERATE (A) 06/04/2022 2204    Sepsis Labs: Lactic Acid, Venous    Component Value Date/Time   LATICACIDVEN 1.5 06/05/2022 0038    MICROBIOLOGY: No results found for this or any previous visit (from the past 240 hour(s)).  RADIOLOGY STUDIES/RESULTS: No results found.   LOS: 21 days   Oren Binet, MD  [facility]    To contact the attending provider between 7A-7P or the covering provider during after hours 7P-7A, please log into the web site [email or web address] and access using universal [place] password for that web site. If you do not have the password, please call the hospital operator.  06/25/2022, 11:19 AM

## 2022-06-25 NOTE — Progress Notes (Signed)
ANTICOAGULATION CONSULT NOTE  Pharmacy Consult:  Lovenox => Apixaban Indication: atrial fibrillation  Allergies  Allergen Reactions   Sulfa Antibiotics Rash   Fluoxetine Hcl Other (See Comments)    (PROZAC) Suicidal thoughts    Patient Measurements: Height: '5\' 6"'$  (167.6 cm) Weight: 76.4 kg (168 lb 6.9 oz) IBW/kg (Calculated) : 59.3  Vital Signs: Temp: 98.1 F (36.7 C) (12/11 0312) Temp Source: Axillary (12/11 0312) BP: 137/73 (12/11 0312) Pulse Rate: 100 (12/11 0312)  Labs: Recent Labs    06/23/22 0152 06/25/22 0321  CREATININE 1.25* 1.10*     Estimated Creatinine Clearance: 50.4 mL/min (A) (by C-G formula based on SCr of 1.1 mg/dL (H)).   Assessment: 33 YOF with history of Afib to transition from Lovenox to home Eliquis.  Renal function and CBC stable; no bleeding reported.    Plan:  D/C Lovenox Start Eliquis 5 mg po bid Monitor for bleeding  Alanda Slim, PharmD, [facility] Clinical Pharmacist Please see AMION for all Pharmacists' Contact Phone Numbers 06/25/2022, 7:28 AM

## 2022-06-26 LAB — GLUCOSE, CAPILLARY
Glucose-Capillary: 117 mg/dL — ABNORMAL HIGH (ref 70–99)
Glucose-Capillary: 141 mg/dL — ABNORMAL HIGH (ref 70–99)
Glucose-Capillary: 148 mg/dL — ABNORMAL HIGH (ref 70–99)
Glucose-Capillary: 159 mg/dL — ABNORMAL HIGH (ref 70–99)
Glucose-Capillary: 179 mg/dL — ABNORMAL HIGH (ref 70–99)
Glucose-Capillary: 199 mg/dL — ABNORMAL HIGH (ref 70–99)

## 2022-06-26 MED ORDER — JEVITY 1.5 CAL/FIBER PO LIQD
237.0000 mL | Freq: Every day | ORAL | Status: DC
  Administered 2022-06-26 (×2): 237 mL
  Administered 2022-06-26: 118.5 mL
  Administered 2022-06-26 – 2022-07-02 (×27): 237 mL
  Filled 2022-06-26 (×36): qty 237

## 2022-06-26 MED ORDER — APIXABAN 5 MG PO TABS
5.0000 mg | ORAL_TABLET | Freq: Two times a day (BID) | ORAL | Status: DC
  Administered 2022-06-26 – 2022-07-03 (×15): 5 mg
  Filled 2022-06-26 (×15): qty 1

## 2022-06-26 MED ORDER — FREE WATER
150.0000 mL | Freq: Four times a day (QID) | Status: DC
  Administered 2022-06-26 – 2022-06-28 (×9): 150 mL

## 2022-06-26 MED ORDER — FREE WATER
60.0000 mL | Freq: Every day | Status: DC
  Administered 2022-06-26 – 2022-07-03 (×35): 60 mL

## 2022-06-26 NOTE — Progress Notes (Signed)
PROGRESS NOTE        PATIENT DETAILS Name: Tara [name] Age: 71 y.o. Sex: female Date of Birth: 05-Oct-1950 Admit Date: 06/04/2022 Admitting Physician Quintella Baton, MD [id number], Hunt Oris, MD  Brief Summary: Patient is a 71 y.o.  female with history of sarcoidosis, tardive dyskinesia, CKD stage IIIa, PAF, HTN, HLD, OSA who presented to the hospital on 11/20 with acute hypoxic respiratory failure due to aspiration PNA in the setting of worsening dysphagia/tardive dyskinesia.  She was also found to have multiple cranial nerve deficits-consistent with neurosarcoidosis and started on high-dose steroids.  Unfortunately-in spite of resumption of her benzodiazepine/high-dose IV steroids-she continued to have severe dysphagia and required PEG tube insertion on 12/8.  See below for further details.  Significant events: 11/20>> admit to [facility]-hypoxia-aspiration pneumonia. 12/04>> started on high-dose Solu-Medrol for cranial nerve deficits/secondary to neurosarcoid.  Significant studies: 11/20>> CXR: Left> right PNA 11/20>> CT head: No acute intracranial abnormality 11/21>> echo: EF 55%. 11/29>> MRI brain wo contrast: No acute intracranial abnormality 11/30>> MRI C-spine: No acute abnormality 11/30>> MRI brain w contrast: No pachymeningeal contrast-enhancement 12/01>> CT chest/abdomen/pelvis: No evidence of primary malignancy/metastatic disease 12/08>> EEG: No seizures.  Significant microbiology data: 11/20>> blood culture: No growth 11/20>> urine culture: Multiple species 11/20>> COVID/influenza PCR: Negative  Procedures: 11/22>>Cortrak tube placement 12/08>> PEG tube placed by IR  Consults: Neuro ID  Subjective: No major issues overnight.  Some intermittent facial pain continues.  Objective: Vitals: Blood pressure (!) 146/81, pulse (!) 103, temperature 98.5 F (36.9 C), temperature source Oral, resp. rate 15, height '5\' 6"'$  (1.676 m), weight 76 kg, SpO2 98  %.   Exam: Gen Exam:Alert awake-not in any distress HEENT:atraumatic, normocephalic Chest: B/L clear to auscultation anteriorly CVS:S1S2 regular Abdomen:soft non tender, non distended Extremities:no edema Neurology: Non focal-generalized weakness. Skin: no rash  Pertinent Labs/Radiology:    Latest Ref Rng & Units 06/22/2022    3:16 AM 06/21/2022    7:23 AM 06/20/2022    4:02 AM  CBC  WBC 4.0 - 10.5 K/uL 10.5  12.8  17.8   Hemoglobin 12.0 - 15.0 g/dL 12.4  12.7  13.2   Hematocrit 36.0 - 46.0 % 38.0  38.3  40.6   Platelets 150 - 400 K/uL 310  291  339     Lab Results  Component Value Date   NA 146 (H) 06/25/2022   K 4.0 06/25/2022   CL 110 06/25/2022   CO2 26 06/25/2022      Assessment/Plan: Severe sepsis with acute hypoxic respiratory failure due to aspiration PNA Sepsis physiology has resolved Completed a course of antimicrobial therapy Cultures remain negative Unfortunately continues to have severe dysphagia- see below  Oropharyngeal dysphagia Felt to be due to flare of tardive dyskinesia and neurosarcoidosis.   No significant improvement after restarting Tranxene/Ingrezza for tardive dyskinesia and IV steroids for neurosarcoidosis.   PEG tube ultimately placed on 12/8-previously had cortrak tube Seems to be tolerating tube feeds well.  Neurosarcoidosis flare With multiple cranial nerve deficits Completed 5 days of Solu-Medrol  Neurology recommending outpatient immunology follow-up.  Tardive dyskinesia flare Followed by movement clinic at [place] to be worse after stopping benzos-now overall improved after restarting Tranxene/Ingrezza. Per patient-has a follow-up with neurology at [facility] on 12/18.  Facial Pain-?Trigeminal Neuralgia Continues to have intermittent facial pain-no improvement with Tegretol-now on Neurontin.  Recommendations from neurology are for outpatient follow-up with primary neurologist with gradual up titration of  Neurontin as tolerated.    AKI on CKD stage IIIa AKI hemodynamically mediated Creatinine level slowly improving Avoid nephrotoxic agents Repeat lytes periodically  Transaminitis Improved Likely related to sepsis Follow LFTs periodically  Minimally elevated troponin Likely demand ischemia Echo with stable EF  Persistent Afib Remains in Afib for past several days Rate controlled Continue Cardizem Continue Eliquis. Discussed with cardiologist-Dr. Percival Spanish 12/09-continue flecainide until follow-up with outpatient primary cardiologist.  HTN BP stable-continue hydralazine/Cardizem/Imdur  OSA Avoid CPAP given significant issues with dysphagia/oral secretions  Left lower lobe groundglass 4 mm nodule Further workup deferred to the outpatient setting.  Nutrition Status: Nutrition Problem: Inadequate oral intake Etiology: inability to eat Signs/Symptoms: NPO status Interventions: Tube feeding  BMI: Estimated body mass index is 27.04 kg/m as calculated from the following:   Height as of this encounter: '5\' 6"'$  (1.676 m).   Weight as of this encounter: 76 kg.   Code status:   Code Status: Full Code   DVT Prophylaxis: SCDs Start: 06/04/22 2221 apixaban (ELIQUIS) tablet 5 mg   Family Communication:  Sister [name] [phone] -updated over the phone 12/8   Disposition Plan: Status is: Inpatient Remains inpatient appropriate because: Severity of illness.   Planned Discharge Destination:Skilled nursing facility when bed available.   Diet: Diet Order             Diet NPO time specified  Diet effective midnight                     Antimicrobial agents: Anti-infectives (From admission, onward)    Start     Dose/Rate Route Frequency Ordered Stop   06/22/22 0600  ceFAZolin (ANCEF) IVPB 2g/100 mL premix        2 g 200 mL/hr over 30 Minutes Intravenous To Radiology 06/20/22 1400 06/22/22 1259   06/06/22 1730  ceFEPIme (MAXIPIME) 2 g in sodium chloride  0.9 % 100 mL IVPB        2 g 200 mL/hr over 30 Minutes Intravenous Every 12 hours 06/06/22 1627 06/09/22 1649   06/05/22 2200  vancomycin (VANCOREADY) IVPB 750 mg/150 mL  Status:  Discontinued        750 mg 150 mL/hr over 60 Minutes Intravenous Every 24 hours 06/04/22 2328 06/05/22 1019   06/04/22 2330  ceFEPIme (MAXIPIME) 2 g in sodium chloride 0.9 % 100 mL IVPB  Status:  Discontinued        2 g 200 mL/hr over 30 Minutes Intravenous Every 24 hours 06/04/22 2324 06/06/22 1627   06/04/22 2330  vancomycin (VANCOREADY) IVPB 1500 mg/300 mL        1,500 mg 150 mL/hr over 120 Minutes Intravenous  Once 06/04/22 2324 06/05/22 0233   06/04/22 2230  cefTRIAXone (ROCEPHIN) 2 g in sodium chloride 0.9 % 100 mL IVPB  Status:  Discontinued        2 g 200 mL/hr over 30 Minutes Intravenous Every 24 hours 06/04/22 2223 06/04/22 2224   06/04/22 2230  azithromycin (ZITHROMAX) 500 mg in sodium chloride 0.9 % 250 mL IVPB  Status:  Discontinued        500 mg 250 mL/hr over 60 Minutes Intravenous Every 24 hours 06/04/22 2223 06/04/22 2224   06/04/22 1730  cefTRIAXone (ROCEPHIN) 2 g in sodium chloride 0.9 % 100 mL IVPB  Status:  Discontinued        2 g 200 mL/hr over 30  Minutes Intravenous Every 24 hours 06/04/22 1725 06/04/22 2321   06/04/22 1730  azithromycin (ZITHROMAX) 500 mg in sodium chloride 0.9 % 250 mL IVPB  Status:  Discontinued        500 mg 250 mL/hr over 60 Minutes Intravenous Every 24 hours 06/04/22 1725 06/04/22 2321        MEDICATIONS: Scheduled Meds:  apixaban  5 mg Per Tube BID   brimonidine  1 drop Both Eyes BID   And   timolol  1 drop Both Eyes BID   cholecalciferol  1,000 Units Per Tube Daily   clorazepate  7.5 mg Per Tube BID   diltiazem  60 mg Per Tube Q8H   feeding supplement (PROSource TF20)  60 mL Per Tube Daily   ferrous sulfate  300 mg Per Tube Q breakfast   flecainide  50 mg Per Tube BID   free water  160 mL Per Tube Q6H   gabapentin  100 mg Per Tube TID   hydrALAZINE   100 mg Per Tube TID   insulin aspart  0-9 Units Subcutaneous Q4H   isosorbide dinitrate  10 mg Per Tube TID   latanoprost  1 drop Both Eyes QHS   multivitamin with minerals  1 tablet Per Tube Daily   pantoprazole (PROTONIX) IV  40 mg Intravenous Q24H   polyethylene glycol  17 g Per Tube BID   polyvinyl alcohol  1 drop Both Eyes TID   valbenazine  40 mg Oral Daily   Continuous Infusions:  feeding supplement (JEVITY 1.2 CAL) 1,000 mL (06/24/22 1205)    PRN Meds:.acetaminophen **OR** acetaminophen, ALPRAZolam, melatonin, metoprolol tartrate, morphine injection, oxyCODONE, polyvinyl alcohol   I have personally reviewed following labs and imaging studies  LABORATORY DATA: CBC: Recent Labs  Lab 06/20/22 0402 06/21/22 0723 06/22/22 0316  WBC 17.8* 12.8* 10.5  HGB 13.2 12.7 12.4  HCT 40.6 38.3 38.0  MCV 88.5 88.7 88.2  PLT 339 291 310     Basic Metabolic Panel: Recent Labs  Lab 06/20/22 0402 06/21/22 0723 06/22/22 0316 06/23/22 0152 06/25/22 0321  NA 134* 139 138 144 146*  K 4.9 4.9 4.7 3.9 4.0  CL 97* 103 101 107 110  CO2 '24 27 26 27 26  '$ GLUCOSE 231* 276* 167* 120* 194*  BUN 36* 53* 63* 54* 48*  CREATININE 1.32* 1.38* 1.33* 1.25* 1.10*  CALCIUM 10.5* 10.6* 10.6* 10.1 9.3     GFR: Estimated Creatinine Clearance: 50.3 mL/min (A) (by C-G formula based on SCr of 1.1 mg/dL (H)).  Liver Function Tests: No results for input(s): "AST", "ALT", "ALKPHOS", "BILITOT", "PROT", "ALBUMIN" in the last 168 hours. No results for input(s): "LIPASE", "AMYLASE" in the last 168 hours. No results for input(s): "AMMONIA" in the last 168 hours.  Coagulation Profile: Recent Labs  Lab 06/22/22 0316  INR 1.2     Cardiac Enzymes: No results for input(s): "CKTOTAL", "CKMB", "CKMBINDEX", "TROPONINI" in the last 168 hours.   BNP (last 3 results) No results for input(s): "PROBNP" in the last 8760 hours.  Lipid Profile: No results for input(s): "CHOL", "HDL", "LDLCALC", "TRIG",  "CHOLHDL", "LDLDIRECT" in the last 72 hours.  Thyroid Function Tests: No results for input(s): "TSH", "T4TOTAL", "FREET4", "T3FREE", "THYROIDAB" in the last 72 hours.  Anemia Panel: No results for input(s): "VITAMINB12", "FOLATE", "FERRITIN", "TIBC", "IRON", "RETICCTPCT" in the last 72 hours.  Urine analysis:    Component Value Date/Time   COLORURINE AMBER (A) 06/04/2022 2204   APPEARANCEUR HAZY (A) 06/04/2022 2204  LABSPEC 1.015 06/04/2022 2204   PHURINE 5.0 06/04/2022 2204   GLUCOSEU NEGATIVE 06/04/2022 2204   GLUCOSEU NEGATIVE 10/10/2021 [place] 06/04/2022 [place] 06/04/2022 [place] 06/04/2022 2204   PROTEINUR 30 (A) 06/04/2022 2204   UROBILINOGEN 0.2 10/10/2021 1013   NITRITE POSITIVE (A) 06/04/2022 2204   LEUKOCYTESUR MODERATE (A) 06/04/2022 2204    Sepsis Labs: Lactic Acid, Venous    Component Value Date/Time   LATICACIDVEN 1.5 06/05/2022 0038    MICROBIOLOGY: No results found for this or any previous visit (from the past 240 hour(s)).  RADIOLOGY STUDIES/RESULTS: No results found.   LOS: 22 days   Oren Binet, MD  [facility]    To contact the attending provider between 7A-7P or the covering provider during after hours 7P-7A, please log into the web site [email or web address] and access using universal [place] password for that web site. If you do not have the password, please call the hospital operator.  06/26/2022, 9:25 AM

## 2022-06-26 NOTE — TOC Progression Note (Addendum)
Transition of Care [facility]) - Progression Note    Patient Details  Name: Tara [name] MRN: [id number] Date of Birth: 11/26/1950  Transition of Care [facility]) CM/SW [place], LCSW Phone Number: 06/26/2022, 10:13 AM  Clinical Narrative:    10am-CSW spoke with admissions at [facility]; she has not reviewed referrals yet and will call CSW back.  -CSW contacted Carolynn Serve again and obtained fax number. Referral faxed to f. [phone].    5:15pm-CSW spoke with Anderson Malta. She asked CSW to check with [facility]. CSW requested [employer] review. [facility] confirming they have a bed. Patient will need to bring Ingrezza from home.    Expected Discharge Plan: [place] Barriers to Discharge: Continued Medical Work up, SNF Pending bed offer, Ship broker  Expected Discharge Plan and Services Expected Discharge Plan: [place] In-house Referral: Clinical Social Work   Post Acute Care Choice: [place] Living arrangements for the past 2 months: [place] Determinants of Health (SDOH) Interventions    Readmission Risk Interventions    06/14/2022    4:37 PM  Readmission Risk Prevention Plan  Transportation Screening Complete  Medication Review Press photographer) Complete  PCP or Specialist appointment within 3-5 days of discharge Complete  HRI or [place] Complete  SW Recovery Care/Counseling Consult Complete  Palliative Care Screening Not [place] Complete

## 2022-06-26 NOTE — Progress Notes (Signed)
Nutrition Follow-up  DOCUMENTATION CODES:   Not applicable  INTERVENTION:   Transition to bolus feeds via PEG:  1 carton of Jevity 1.5 - five times daily Start with 1/2 a carton at the first feed, then increase to 1 carton at the next feed Administer slowly, do not push with the plunger 30 mL free water flush before and after each bolus + 10 mL free water - four times per day Provides 1778 kcal, 76 gm protein, and 1800 mL free water daily.  NUTRITION DIAGNOSIS:   Inadequate oral intake related to inability to eat as evidenced by NPO status. - Being addressed via TF  GOAL:   Patient will meet greater than or equal to 90% of their needs - Being met via TF  MONITOR:   Diet advancement, Labs, TF tolerance, I & O's, Weight trends  REASON FOR ASSESSMENT:   Consult Enteral/tube feeding initiation and management  ASSESSMENT:   71 y.o. female presented to the ED with decreased consciousness from SNF. PMH includes CKD IV, MDD, and HTN. Pt admitted with sepsis secondary to PNA, AKI on CKD, acute liver failure, and elevated troponin.  11/22 - Cortrak placed (tip pyloric region) 11/29 - diet advanced to full liquids  12/03 - NPO 12/05 - MBS, recommend NPO 12/08 - Cortrak removed, PEG placed 12/12 - transitioned to bolus  Plan to transition to bolus feeds. Discussed plan for transition to bolus with RN. RD later rounded, RN reports that pt tolerated first bolus well. RD noted pt laying in bed.   Currently pending facility acceptance.   Medications reviewed and include: Vitamin D3, Ferrous Sulfate, NovoLog SSI, MVI, Protonix, Miralax Labs reviewed: Sodium 146, BUN 48, Creatinine 1.10, 24 hr CBGs 117-192  Diet Order:   Diet Order             Diet NPO time specified  Diet effective midnight                  EDUCATION NEEDS:   Not appropriate for education at this time  Skin:  Skin Assessment: Reviewed RN Assessment  Last BM:  12/8  Height:  Ht Readings from Last  1 Encounters:  06/04/22 _0  (1.676 m)   Weight:  Wt Readings from Last 1 Encounters:  06/26/22 76 kg   Ideal Body Weight:  59.1 kg  BMI:  Body mass index is 27.04 kg/m.  Estimated Nutritional Needs:  Kcal:  1700-1900 Protein:  85-100 grams Fluid:  >/= 1.7 L    Hermina Barters RD, LDN Clinical Dietitian See [facility] for contact information.

## 2022-06-26 NOTE — Plan of Care (Signed)
  Problem: Education: Goal: Knowledge of General Education information will improve Description: Including pain rating scale, medication(s)/side effects and non-pharmacologic comfort measures Outcome: Progressing   Problem: Health Behavior/Discharge Planning: Goal: Ability to manage health-related needs will improve Outcome: Progressing   Problem: Clinical Measurements: Goal: Ability to maintain clinical measurements within normal limits will improve Outcome: Progressing Goal: Will remain free from infection Outcome: Progressing Goal: Diagnostic test results will improve Outcome: Progressing Goal: Respiratory complications will improve Outcome: Progressing Goal: Cardiovascular complication will be avoided Outcome: Progressing   Problem: Activity: Goal: Risk for activity intolerance will decrease Outcome: Progressing   Problem: Nutrition: Goal: Adequate nutrition will be maintained Outcome: Progressing   Problem: Coping: Goal: Level of anxiety will decrease Outcome: Progressing   Problem: Elimination: Goal: Will not experience complications related to bowel motility Outcome: Progressing Goal: Will not experience complications related to urinary retention Outcome: Progressing   Problem: Pain Managment: Goal: General experience of comfort will improve Outcome: Progressing   Problem: Safety: Goal: Ability to remain free from injury will improve Outcome: Progressing   Problem: Skin Integrity: Goal: Risk for impaired skin integrity will decrease Outcome: Progressing   Problem: Education: Goal: Ability to describe self-care measures that may prevent or decrease complications (Diabetes Survival Skills Education) will improve Outcome: Progressing   Problem: Coping: Goal: Ability to adjust to condition or change in health will improve Outcome: Progressing   Problem: Fluid Volume: Goal: Ability to maintain a balanced intake and output will improve Outcome:  Progressing   Problem: Health Behavior/Discharge Planning: Goal: Ability to identify and utilize available resources and services will improve Outcome: Progressing Goal: Ability to manage health-related needs will improve Outcome: Progressing   Problem: Metabolic: Goal: Ability to maintain appropriate glucose levels will improve Outcome: Progressing   Problem: Nutritional: Goal: Maintenance of adequate nutrition will improve Outcome: Progressing Goal: Progress toward achieving an optimal weight will improve Outcome: Progressing   Problem: Skin Integrity: Goal: Risk for impaired skin integrity will decrease Outcome: Progressing

## 2022-06-27 LAB — GLUCOSE, CAPILLARY
Glucose-Capillary: 112 mg/dL — ABNORMAL HIGH (ref 70–99)
Glucose-Capillary: 135 mg/dL — ABNORMAL HIGH (ref 70–99)
Glucose-Capillary: 171 mg/dL — ABNORMAL HIGH (ref 70–99)
Glucose-Capillary: 205 mg/dL — ABNORMAL HIGH (ref 70–99)
Glucose-Capillary: 54 mg/dL — ABNORMAL LOW (ref 70–99)
Glucose-Capillary: 79 mg/dL (ref 70–99)

## 2022-06-27 MED ORDER — GLYCOPYRROLATE 0.2 MG/ML IJ SOLN
0.2000 mg | Freq: Once | INTRAMUSCULAR | Status: AC
  Administered 2022-06-27: 0.2 mg via INTRAVENOUS
  Filled 2022-06-27: qty 1

## 2022-06-27 NOTE — Progress Notes (Signed)
PROGRESS NOTE        PATIENT DETAILS Name: Tara [name] Age: 71 y.o. Sex: female Date of Birth: 09-05-1950 Admit Date: 06/04/2022 Admitting Physician Quintella Baton, MD YKD:XIPJ, Hunt Oris, MD  Brief Summary: Patient is a 71 y.o.  female with history of sarcoidosis, tardive dyskinesia, CKD stage IIIa, PAF, HTN, HLD, OSA who presented to the hospital on 11/20 with acute hypoxic respiratory failure due to aspiration PNA in the setting of worsening dysphagia/tardive dyskinesia.  She was also found to have multiple cranial nerve deficits-consistent with neurosarcoidosis and started on high-dose steroids.  Unfortunately-in spite of resumption of her benzodiazepine/high-dose IV steroids-she continued to have severe dysphagia and required PEG tube insertion on 12/8.  See below for further details.  Significant events: 11/20>> admit to [facility]-hypoxia-aspiration pneumonia. 12/04>> started on high-dose Solu-Medrol for cranial nerve deficits/secondary to neurosarcoid.  Significant studies: 11/20>> CXR: Left> right PNA 11/20>> CT head: No acute intracranial abnormality 11/21>> echo: EF 55%. 11/29>> MRI brain wo contrast: No acute intracranial abnormality 11/30>> MRI C-spine: No acute abnormality 11/30>> MRI brain w contrast: No pachymeningeal contrast-enhancement 12/01>> CT chest/abdomen/pelvis: No evidence of primary malignancy/metastatic disease 12/08>> EEG: No seizures.  Significant microbiology data: 11/20>> blood culture: No growth 11/20>> urine culture: Multiple species 11/20>> COVID/influenza PCR: Negative  Procedures: 11/22>>Cortrak tube placement 12/08>> PEG tube placed by IR  Consults: Neuro ID  Subjective: No major issues overnight-continues to have some intermittent facial pain.  Awaiting SNF bed.  Medically stable for discharge.  Objective: Vitals: Blood pressure 130/80, pulse 95, temperature 98.2 F (36.8 C), temperature source Oral, resp. rate  12, height '5\' 6"'$  (1.676 m), weight 76 kg, SpO2 96 %.   Exam: Gen Exam:Alert awake-not in any distress HEENT:atraumatic, normocephalic Chest: B/L clear to auscultation anteriorly CVS:S1S2 regular Abdomen:soft non tender, non distended Extremities:no edema Neurology: Non focal-generalized weakness. Skin: no rash  Pertinent Labs/Radiology:    Latest Ref Rng & Units 06/22/2022    3:16 AM 06/21/2022    7:23 AM 06/20/2022    4:02 AM  CBC  WBC 4.0 - 10.5 K/uL 10.5  12.8  17.8   Hemoglobin 12.0 - 15.0 g/dL 12.4  12.7  13.2   Hematocrit 36.0 - 46.0 % 38.0  38.3  40.6   Platelets 150 - 400 K/uL 310  291  339     Lab Results  Component Value Date   NA 146 (H) 06/25/2022   K 4.0 06/25/2022   CL 110 06/25/2022   CO2 26 06/25/2022      Assessment/Plan: Severe sepsis with acute hypoxic respiratory failure due to aspiration PNA Sepsis physiology has resolved Completed a course of antimicrobial therapy Cultures remain negative Unfortunately continues to have severe dysphagia- see below  Oropharyngeal dysphagia Felt to be due to flare of tardive dyskinesia and neurosarcoidosis.   No significant improvement after restarting Tranxene/Ingrezza for tardive dyskinesia and IV steroids for neurosarcoidosis.   PEG tube ultimately placed on 12/8-previously had cortrak tube Seems to be tolerating tube feeds well.  Neurosarcoidosis flare With multiple cranial nerve deficits Completed 5 days of Solu-Medrol  Neurology recommending outpatient immunology follow-up.  Tardive dyskinesia flare Followed by movement clinic at [facility] to be worse after stopping benzos-now overall improved after restarting Tranxene/Ingrezza. Per patient-has a follow-up with neurology at [facility] on 12/18.  Facial Pain-?Trigeminal Neuralgia Continues to have intermittent facial pain-no  improvement with Tegretol-now on Neurontin.   Recommendations from neurology are for outpatient follow-up with  primary neurologist with gradual up titration of Neurontin as tolerated.    AKI on CKD stage IIIa AKI hemodynamically mediated Creatinine level slowly improving Avoid nephrotoxic agents Repeat lytes periodically  Transaminitis Improved Likely related to sepsis Follow LFTs periodically  Minimally elevated troponin Likely demand ischemia Echo with stable EF  Persistent Afib Remains in Afib for past several days Rate controlled Continue Cardizem Continue Eliquis. Discussed with cardiologist-Dr. Percival Spanish 12/09-continue flecainide until follow-up with outpatient primary cardiologist.  HTN BP stable-continue hydralazine/Cardizem/Imdur  OSA Avoid CPAP given significant issues with dysphagia/oral secretions  Left lower lobe groundglass 4 mm nodule Further workup deferred to the outpatient setting.  Nutrition Status: Nutrition Problem: Inadequate oral intake Etiology: inability to eat Signs/Symptoms: NPO status Interventions: Tube feeding  BMI: Estimated body mass index is 27.04 kg/m as calculated from the following:   Height as of this encounter: '5\' 6"'$  (1.676 m).   Weight as of this encounter: 76 kg.   Code status:   Code Status: Full Code   DVT Prophylaxis: SCDs Start: 06/04/22 2221 apixaban (ELIQUIS) tablet 5 mg   Family Communication:  Sister [name] [phone] -updated over the phone 12/8   Disposition Plan: Status is: Inpatient Remains inpatient appropriate because: Severity of illness.   Planned Discharge Destination:Skilled nursing facility when bed available.   Diet: Diet Order             Diet NPO time specified  Diet effective midnight                     Antimicrobial agents: Anti-infectives (From admission, onward)    Start     Dose/Rate Route Frequency Ordered Stop   06/22/22 0600  ceFAZolin (ANCEF) IVPB 2g/100 mL premix        2 g 200 mL/hr over 30 Minutes Intravenous To Radiology 06/20/22 1400 06/22/22 1259   06/06/22  1730  ceFEPIme (MAXIPIME) 2 g in sodium chloride 0.9 % 100 mL IVPB        2 g 200 mL/hr over 30 Minutes Intravenous Every 12 hours 06/06/22 1627 06/09/22 1649   06/05/22 2200  vancomycin (VANCOREADY) IVPB 750 mg/150 mL  Status:  Discontinued        750 mg 150 mL/hr over 60 Minutes Intravenous Every 24 hours 06/04/22 2328 06/05/22 1019   06/04/22 2330  ceFEPIme (MAXIPIME) 2 g in sodium chloride 0.9 % 100 mL IVPB  Status:  Discontinued        2 g 200 mL/hr over 30 Minutes Intravenous Every 24 hours 06/04/22 2324 06/06/22 1627   06/04/22 2330  vancomycin (VANCOREADY) IVPB 1500 mg/300 mL        1,500 mg 150 mL/hr over 120 Minutes Intravenous  Once 06/04/22 2324 06/05/22 0233   06/04/22 2230  cefTRIAXone (ROCEPHIN) 2 g in sodium chloride 0.9 % 100 mL IVPB  Status:  Discontinued        2 g 200 mL/hr over 30 Minutes Intravenous Every 24 hours 06/04/22 2223 06/04/22 2224   06/04/22 2230  azithromycin (ZITHROMAX) 500 mg in sodium chloride 0.9 % 250 mL IVPB  Status:  Discontinued        500 mg 250 mL/hr over 60 Minutes Intravenous Every 24 hours 06/04/22 2223 06/04/22 2224   06/04/22 1730  cefTRIAXone (ROCEPHIN) 2 g in sodium chloride 0.9 % 100 mL IVPB  Status:  Discontinued  2 g 200 mL/hr over 30 Minutes Intravenous Every 24 hours 06/04/22 1725 06/04/22 2321   06/04/22 1730  azithromycin (ZITHROMAX) 500 mg in sodium chloride 0.9 % 250 mL IVPB  Status:  Discontinued        500 mg 250 mL/hr over 60 Minutes Intravenous Every 24 hours 06/04/22 1725 06/04/22 2321        MEDICATIONS: Scheduled Meds:  apixaban  5 mg Per Tube BID   brimonidine  1 drop Both Eyes BID   And   timolol  1 drop Both Eyes BID   cholecalciferol  1,000 Units Per Tube Daily   clorazepate  7.5 mg Per Tube BID   diltiazem  60 mg Per Tube Q8H   feeding supplement (JEVITY 1.5 CAL/FIBER)  237 mL Per Tube 5 X Daily   ferrous sulfate  300 mg Per Tube Q breakfast   flecainide  50 mg Per Tube BID   free water  150 mL Per  Tube QID   free water  60 mL Per Tube 5 X Daily   gabapentin  100 mg Per Tube TID   hydrALAZINE  100 mg Per Tube TID   insulin aspart  0-9 Units Subcutaneous Q4H   isosorbide dinitrate  10 mg Per Tube TID   latanoprost  1 drop Both Eyes QHS   multivitamin with minerals  1 tablet Per Tube Daily   pantoprazole (PROTONIX) IV  40 mg Intravenous Q24H   polyethylene glycol  17 g Per Tube BID   polyvinyl alcohol  1 drop Both Eyes TID   valbenazine  40 mg Oral Daily   Continuous Infusions:    PRN Meds:.acetaminophen **OR** acetaminophen, ALPRAZolam, melatonin, metoprolol tartrate, morphine injection, oxyCODONE, polyvinyl alcohol   I have personally reviewed following labs and imaging studies  LABORATORY DATA: CBC: Recent Labs  Lab 06/21/22 0723 06/22/22 0316  WBC 12.8* 10.5  HGB 12.7 12.4  HCT 38.3 38.0  MCV 88.7 88.2  PLT 291 310     Basic Metabolic Panel: Recent Labs  Lab 06/21/22 0723 06/22/22 0316 06/23/22 0152 06/25/22 0321  NA 139 138 144 146*  K 4.9 4.7 3.9 4.0  CL 103 101 107 110  CO2 '27 26 27 26  '$ GLUCOSE 276* 167* 120* 194*  BUN 53* 63* 54* 48*  CREATININE 1.38* 1.33* 1.25* 1.10*  CALCIUM 10.6* 10.6* 10.1 9.3     GFR: Estimated Creatinine Clearance: 50.3 mL/min (A) (by C-G formula based on SCr of 1.1 mg/dL (H)).  Liver Function Tests: No results for input(s): "AST", "ALT", "ALKPHOS", "BILITOT", "PROT", "ALBUMIN" in the last 168 hours. No results for input(s): "LIPASE", "AMYLASE" in the last 168 hours. No results for input(s): "AMMONIA" in the last 168 hours.  Coagulation Profile: Recent Labs  Lab 06/22/22 0316  INR 1.2     Cardiac Enzymes: No results for input(s): "CKTOTAL", "CKMB", "CKMBINDEX", "TROPONINI" in the last 168 hours.   BNP (last 3 results) No results for input(s): "PROBNP" in the last 8760 hours.  Lipid Profile: No results for input(s): "CHOL", "HDL", "LDLCALC", "TRIG", "CHOLHDL", "LDLDIRECT" in the last 72 hours.  Thyroid  Function Tests: No results for input(s): "TSH", "T4TOTAL", "FREET4", "T3FREE", "THYROIDAB" in the last 72 hours.  Anemia Panel: No results for input(s): "VITAMINB12", "FOLATE", "FERRITIN", "TIBC", "IRON", "RETICCTPCT" in the last 72 hours.  Urine analysis:    Component Value Date/Time   COLORURINE AMBER (A) 06/04/2022 2204   APPEARANCEUR HAZY (A) 06/04/2022 2204   LABSPEC 1.015 06/04/2022 2204  PHURINE 5.0 06/04/2022 2204   GLUCOSEU NEGATIVE 06/04/2022 2204   GLUCOSEU NEGATIVE 10/10/2021 1013   HGBUR NEGATIVE 06/04/2022 2204   [place] 06/04/2022 2204   [place] 06/04/2022 2204   PROTEINUR 30 (A) 06/04/2022 2204   UROBILINOGEN 0.2 10/10/2021 1013   NITRITE POSITIVE (A) 06/04/2022 2204   LEUKOCYTESUR MODERATE (A) 06/04/2022 2204    Sepsis Labs: Lactic Acid, Venous    Component Value Date/Time   LATICACIDVEN 1.5 06/05/2022 0038    MICROBIOLOGY: No results found for this or any previous visit (from the past 240 hour(s)).  RADIOLOGY STUDIES/RESULTS: No results found.   LOS: 23 days   Oren Binet, MD  [facility]    To contact the attending provider between 7A-7P or the covering provider during after hours 7P-7A, please log into the web site [email or web address] and access using universal [place] password for that web site. If you do not have the password, please call the hospital operator.  06/27/2022, 11:10 AM

## 2022-06-28 ENCOUNTER — Inpatient Hospital Stay (HOSPITAL_COMMUNITY): Payer: BC Managed Care – PPO

## 2022-06-28 DIAGNOSIS — Z66 Do not resuscitate: Secondary | ICD-10-CM

## 2022-06-28 DIAGNOSIS — R627 Adult failure to thrive: Secondary | ICD-10-CM

## 2022-06-28 DIAGNOSIS — R1312 Dysphagia, oropharyngeal phase: Secondary | ICD-10-CM

## 2022-06-28 LAB — CBC
HCT: 40.9 % (ref 36.0–46.0)
Hemoglobin: 13.2 g/dL (ref 12.0–15.0)
MCH: 29.6 pg (ref 26.0–34.0)
MCHC: 32.3 g/dL (ref 30.0–36.0)
MCV: 91.7 fL (ref 80.0–100.0)
Platelets: 242 10*3/uL (ref 150–400)
RBC: 4.46 MIL/uL (ref 3.87–5.11)
RDW: 16.8 % — ABNORMAL HIGH (ref 11.5–15.5)
WBC: 16.5 10*3/uL — ABNORMAL HIGH (ref 4.0–10.5)
nRBC: 0 % (ref 0.0–0.2)

## 2022-06-28 LAB — BASIC METABOLIC PANEL
Anion gap: 9 (ref 5–15)
BUN: 26 mg/dL — ABNORMAL HIGH (ref 8–23)
CO2: 29 mmol/L (ref 22–32)
Calcium: 9.4 mg/dL (ref 8.9–10.3)
Chloride: 109 mmol/L (ref 98–111)
Creatinine, Ser: 0.95 mg/dL (ref 0.44–1.00)
GFR, Estimated: >60 mL/min (ref >60)
Glucose, Bld: 171 mg/dL — ABNORMAL HIGH (ref 70–99)
Potassium: 4.2 mmol/L (ref 3.5–5.1)
Sodium: 147 mmol/L — ABNORMAL HIGH (ref 135–145)

## 2022-06-28 LAB — GLUCOSE, CAPILLARY
Glucose-Capillary: 133 mg/dL — ABNORMAL HIGH (ref 70–99)
Glucose-Capillary: 143 mg/dL — ABNORMAL HIGH (ref 70–99)
Glucose-Capillary: 143 mg/dL — ABNORMAL HIGH (ref 70–99)
Glucose-Capillary: 149 mg/dL — ABNORMAL HIGH (ref 70–99)
Glucose-Capillary: 150 mg/dL — ABNORMAL HIGH (ref 70–99)
Glucose-Capillary: 163 mg/dL — ABNORMAL HIGH (ref 70–99)
Glucose-Capillary: 200 mg/dL — ABNORMAL HIGH (ref 70–99)
Glucose-Capillary: 288 mg/dL — ABNORMAL HIGH (ref 70–99)

## 2022-06-28 LAB — PROCALCITONIN: Procalcitonin: <0.1 ng/mL

## 2022-06-28 MED ORDER — GLYCOPYRROLATE 0.2 MG/ML IJ SOLN
0.1000 mg | Freq: Three times a day (TID) | INTRAMUSCULAR | Status: DC
  Administered 2022-06-28 – 2022-07-03 (×16): 0.1 mg via INTRAVENOUS
  Filled 2022-06-28 (×16): qty 1

## 2022-06-28 MED ORDER — FREE WATER
250.0000 mL | Freq: Four times a day (QID) | Status: DC
  Administered 2022-06-28 – 2022-07-03 (×20): 250 mL

## 2022-06-28 MED ORDER — SODIUM CHLORIDE 0.9 % IV SOLN
3.0000 g | Freq: Four times a day (QID) | INTRAVENOUS | Status: AC
  Administered 2022-06-28 – 2022-07-03 (×20): 3 g via INTRAVENOUS
  Filled 2022-06-28 (×20): qty 8

## 2022-06-28 NOTE — Progress Notes (Signed)
Pharmacy Antibiotic Note  Tara [name] is a 71 y.o. female admitted on 06/04/2022 with aspiration pneumonia.  Pharmacy has been consulted for Unasyn dosing.  Plan: Unasyn 3 grams IV every 6 hours Monitor clinical progress, cultures/sensitivities, renal function, abx plan  Height: '5\' 6"'$  (167.6 cm) Weight: 77.9 kg (171 lb 11.8 oz) IBW/kg (Calculated) : 59.3  Temp (24hrs), Avg:98.6 F (37 C), Min:97.8 F (36.6 C), Max:99.1 F (37.3 C)  Recent Labs  Lab 06/22/22 0316 06/23/22 0152 06/25/22 0321 06/28/22 0941  WBC 10.5  --   --  16.5*  CREATININE 1.33* 1.25* 1.10* 0.95    Estimated Creatinine Clearance: 58.9 mL/min (by C-G formula based on SCr of 0.95 mg/dL).    Allergies  Allergen Reactions   Sulfa Antibiotics Rash   Fluoxetine Hcl Other (See Comments)    (PROZAC) Suicidal thoughts    Antimicrobials this admission: 12/14 Unasyn >>   Dose adjustments this admission:  Microbiology results:   Thank you for allowing [place] to participate in this patients care. Jens Som, PharmD 06/28/2022 1:04 PM  **Pharmacist phone directory can be found on [email or web address] listed under [place]**

## 2022-06-28 NOTE — Progress Notes (Signed)
Physical Therapy Treatment Patient Details Name: Tara [name] MRN: [id number] DOB: 09-Sep-1950 Today's Date: 06/28/2022   History of Present Illness Pt presented 11/20 with acute hypoxic respiratory failure due to aspiration PNA in the setting of worsening dysphagia/tardive dyskinesia.  She was also found to have multiple cranial nerve deficits-consistent with neurosarcoidosis and started on high-dose steroids.  Unfortunately-in spite of resumption of her benzodiazepine/high-dose IV steroids-she continued to have severe dysphagia and required PEG tube insertion on 12/8   PMH significant for CKD, sarcoidosis, HTN, obesity,OSA on CPAP, atrial fibrillation on Eliquis, hx of presyncope seen in [facility] 3 x since 8/29.    PT Comments    Focused on bed mobility, sitting balance, and trunk control/strengthening. Pt continues to require significant assist for all mobility. Continue to recommend SNF.    Recommendations for follow up therapy are one component of a multi-disciplinary discharge planning process, led by the attending physician.  Recommendations may be updated based on patient status, additional functional criteria and insurance authorization.  Follow Up Recommendations  Skilled nursing-short term rehab (<3 hours/day) Can patient physically be transported by private vehicle: No   Assistance Recommended at Discharge Frequent or constant Supervision/Assistance  Patient can return home with the following Assist for transportation;A lot of help with bathing/dressing/bathroom;Help with stairs or ramp for entrance;Two people to help with walking and/or transfers   Equipment Recommendations  None recommended by PT    Recommendations for Other Services       Precautions / Restrictions Precautions Precautions: Fall;Other (comment) Precaution Comments: PEG Restrictions Weight Bearing Restrictions: No     Mobility  Bed Mobility Overal bed mobility: Needs Assistance Bed Mobility: Sit to  Supine, Supine to Sit     Supine to sit: Mod assist, HOB elevated Sit to supine: Max assist   General bed mobility comments: Assist to bring legs off of bed, elevate trunk into sitting, and bring hips to EOB. Assist to lower trunk and bring legs up into bed returning to supine.    Transfers                        Ambulation/Gait                   Stairs             Wheelchair Mobility    Modified Rankin (Stroke Patients Only)       Balance Overall balance assessment: Needs assistance Sitting-balance support: Feet unsupported, Bilateral upper extremity supported Sitting balance-Leahy Scale: Poor Sitting balance - Comments: Sat EOB x 10 minutes with initial min assist progressing to min guard Postural control: Posterior lean                                  Cognition Arousal/Alertness: Awake/alert Behavior During Therapy: Flat affect Overall Cognitive Status: No family/caregiver present to determine baseline cognitive functioning Area of Impairment: Attention, Safety/judgement, Awareness, Following commands                   Current Attention Level: Sustained   Following Commands: Follows one step commands consistently Safety/Judgement: Decreased awareness of deficits, Decreased awareness of safety   Problem Solving: Slow processing, Requires verbal cues, Requires tactile cues          Exercises Other Exercises Other Exercises: Leaning to prop on arm in sitting and push up to return to midline. 5 reps each side with  mod assist Other Exercises: Sitting trunk extension x 10 Other Exercises: Sitting trunk flexion x 10    General Comments        Pertinent Vitals/Pain Pain Assessment Pain Location: "my face" Pain Descriptors / Indicators: Discomfort, Constant Pain Intervention(s): Limited activity within patient's tolerance    Home Living                          Prior Function            PT Goals  (current goals can now be found in the care plan section) Acute Rehab PT Goals Patient Stated Goal: to find new rehab facility Progress towards PT goals: Not progressing toward goals - comment    Frequency    Min 2X/week      PT Plan Current plan remains appropriate    Co-evaluation              AM-PAC PT "6 Clicks" Mobility   Outcome Measure  Help needed turning from your back to your side while in a flat bed without using bedrails?: A Lot Help needed moving from lying on your back to sitting on the side of a flat bed without using bedrails?: A Lot Help needed moving to and from a bed to a chair (including a wheelchair)?: Total Help needed standing up from a chair using your arms (e.g., wheelchair or bedside chair)?: Total Help needed to walk in hospital room?: Total Help needed climbing 3-5 steps with a railing? : Total 6 Click Score: 8    End of Session   Activity Tolerance: Patient limited by fatigue Patient left: in bed;with nursing/sitter in room;with bed alarm set   PT Visit Diagnosis: Other abnormalities of gait and mobility (R26.89);Muscle weakness (generalized) (M62.81)     Time: 1213-1228 PT Time Calculation (min) (ACUTE ONLY): 15 min  Charges:  $Therapeutic Activity: 8-22 mins                     [place] Office [place] 06/28/2022, 2:32 PM

## 2022-06-28 NOTE — TOC Progression Note (Addendum)
Transition of Care [facility]) - Progression Note    Patient Details  Name: Tara [name] MRN: [id number] Date of Birth: 21-Feb-1951  Transition [facility]) CM/SW [place], LCSW Phone Number: 06/28/2022, 1:43 PM  Clinical Narrative:    9am-CSW reached out to advocate to confirm choice of [facility] as [facility] has declined.   12pm-CSW received call from [facility] stating patient's advocate had called them requesting they review patient again. Walker will call me back.   1pm-CSW received voicemail from patient's advocate to contact [place] to review.   1:45pm-CSW received call from [employer] stating they can start insurance authorization process in anticipation of possible Monday admission if patient is stable as they cannot accept a patient with a PEG on the weekend. CSW left vm for advocate to make her aware.   1:54pm-Jennifer called CSW back and reported she can bring patient's Ingrezza from home if needed.    Expected Discharge Plan: [place] Barriers to Discharge: Continued Medical Work up, SNF Pending bed offer, Ship broker  Expected Discharge Plan and Services Expected Discharge Plan: [place] In-house Referral: Clinical Social Work   Post Acute Care Choice: [place] Living arrangements for the past 2 months: [place] Determinants of Health (SDOH) Interventions    Readmission Risk Interventions    06/14/2022    4:37 PM  Readmission Risk Prevention Plan  Transportation Screening Complete  Medication Review Press photographer) Complete  PCP or Specialist appointment within 3-5 days of discharge Complete  HRI or [place] Complete  SW Recovery Care/Counseling Consult Complete  Palliative Care Screening Not [place] Complete

## 2022-06-28 NOTE — Progress Notes (Addendum)
PROGRESS NOTE        PATIENT DETAILS Name: Tara [name] Age: 71 y.o. Sex: female Date of Birth: 04-03-1951 Admit Date: 06/04/2022 Admitting Physician Quintella Baton, MD YNW:GNFA, Hunt Oris, MD  Brief Summary: Patient is a 71 y.o.  female with history of sarcoidosis, tardive dyskinesia, CKD stage IIIa, PAF, HTN, HLD, OSA who presented to the hospital on 11/20 with acute hypoxic respiratory failure due to aspiration PNA in the setting of worsening dysphagia/tardive dyskinesia.  She was also found to have multiple cranial nerve deficits-consistent with neurosarcoidosis and started on high-dose steroids.  Unfortunately-in spite of resumption of her benzodiazepine/high-dose IV steroids-she continued to have severe dysphagia and required PEG tube insertion on 12/8.  See below for further details.  Significant events: 11/20>> admit to [facility]-hypoxia-aspiration pneumonia. 12/04>> started on high-dose Solu-Medrol for cranial nerve deficits/secondary to neurosarcoid. 12/14>> accumulating secretions-transmitted upper airway sounds-leukocytosis-suspected aspiration pneumonia-Unasyn started.  Significant studies: 11/20>> CXR: Left> right PNA 11/20>> CT head: No acute intracranial abnormality 11/21>> echo: EF 55%. 11/29>> MRI brain wo contrast: No acute intracranial abnormality 11/30>> MRI C-spine: No acute abnormality 11/30>> MRI brain w contrast: No pachymeningeal contrast-enhancement 12/01>> CT chest/abdomen/pelvis: No evidence of primary malignancy/metastatic disease 12/08>> EEG: No seizures. 12/14>> CXR: No PNA  Significant microbiology data: 11/20>> blood culture: No growth 11/20>> urine culture: Multiple species 11/20>> COVID/influenza PCR: Negative  Procedures: 11/22>>Cortrak tube placement 12/08>> PEG tube placed by IR  Consults: Neuro ID  Subjective: Some issues with secretion today-has a good cough reflex.  Objective: Vitals: Blood pressure 124/69,  pulse (!) 103, temperature 97.8 F (36.6 C), temperature source Oral, resp. rate (!) 26, height '5\' 6"'$  (1.676 m), weight 77.9 kg, SpO2 97 %.   Exam: Gen Exam:Alert awake-not in any distress HEENT:atraumatic, normocephalic Chest: Significantly transmitted upper airway sounds CVS:S1S2 regular Abdomen:soft non tender, non distended Extremities:no edema Neurology: Non focal Skin: no rash  Pertinent Labs/Radiology:    Latest Ref Rng & Units 06/28/2022    9:41 AM 06/22/2022    3:16 AM 06/21/2022    7:23 AM  CBC  WBC 4.0 - 10.5 K/uL 16.5  10.5  12.8   Hemoglobin 12.0 - 15.0 g/dL 13.2  12.4  12.7   Hematocrit 36.0 - 46.0 % 40.9  38.0  38.3   Platelets 150 - 400 K/uL 242  310  291     Lab Results  Component Value Date   NA 147 (H) 06/28/2022   K 4.2 06/28/2022   CL 109 06/28/2022   CO2 29 06/28/2022      Assessment/Plan: Severe sepsis with acute hypoxic respiratory failure due to aspiration PNA Sepsis physiology has resolved Completed a course of antimicrobial therapy Cultures remain negative Unfortunately-continues to have severe dysphagia-some issues with accumulation of secretions today-significantly transmitted upper airway sounds-has new leukocytosis-suspect has aspirated again.  Will start Unasyn per pharmacy.  Oropharyngeal dysphagia Felt to be due to flare of tardive dyskinesia and neurosarcoidosis.   No significant improvement after restarting Tranxene/Ingrezza for tardive dyskinesia and IV steroids for neurosarcoidosis.   PEG tube ultimately placed on 12/8-previously had cortrak tube Seems to be tolerating tube feeds well.  Neurosarcoidosis flare With multiple cranial nerve deficits Completed 5 days of Solu-Medrol  Neurology recommending outpatient immunology follow-up.  Tardive dyskinesia flare Followed by movement clinic at [place] to be worse after stopping benzos-now overall improved after  restarting Tranxene/Ingrezza. Per patient-has a  follow-up with neurology at [facility] on 12/18.  Facial Pain-?Trigeminal Neuralgia Continues to have intermittent facial pain-no improvement with Tegretol-now on Neurontin.   Recommendations from neurology are for outpatient follow-up with primary neurologist with gradual up titration of Neurontin as tolerated.    AKI on CKD stage IIIa AKI hemodynamically mediated Creatinine level slowly improving Avoid nephrotoxic agents Repeat lytes periodically  Transaminitis Improved Likely related to sepsis Follow LFTs periodically  Minimally elevated troponin Likely demand ischemia Echo with stable EF  Persistent Afib Remains in Afib for past several days Rate controlled Continue Cardizem Continue Eliquis. Discussed with cardiologist-Dr. Percival Spanish 12/09-continue flecainide until follow-up with outpatient primary cardiologist.  HTN BP stable-continue hydralazine/Cardizem/Imdur  OSA Avoid CPAP given significant issues with dysphagia/oral secretions  Left lower lobe groundglass 4 mm nodule Further workup deferred to the outpatient setting.  Nutrition Status: Nutrition Problem: Inadequate oral intake Etiology: inability to eat Signs/Symptoms: NPO status Interventions: Tube feeding  BMI: Estimated body mass index is 27.72 kg/m as calculated from the following:   Height as of this encounter: '5\' 6"'$  (1.676 m).   Weight as of this encounter: 77.9 kg.   Code status:   Code Status: Full Code   DVT Prophylaxis: SCDs Start: 06/04/22 2221 apixaban (ELIQUIS) tablet 5 mg   Family Communication: Sister [name] [phone] -updated over the phone 12/14  Disposition Plan: Status is: Inpatient Remains inpatient appropriate because: Severity of illness.   Planned Discharge Destination:Skilled nursing facility when bed available.   Diet: Diet Order             Diet NPO time specified  Diet effective midnight                     Antimicrobial  agents: Anti-infectives (From admission, onward)    Start     Dose/Rate Route Frequency Ordered Stop   06/22/22 0600  ceFAZolin (ANCEF) IVPB 2g/100 mL premix        2 g 200 mL/hr over 30 Minutes Intravenous To Radiology 06/20/22 1400 06/22/22 1259   06/06/22 1730  ceFEPIme (MAXIPIME) 2 g in sodium chloride 0.9 % 100 mL IVPB        2 g 200 mL/hr over 30 Minutes Intravenous Every 12 hours 06/06/22 1627 06/09/22 1649   06/05/22 2200  vancomycin (VANCOREADY) IVPB 750 mg/150 mL  Status:  Discontinued        750 mg 150 mL/hr over 60 Minutes Intravenous Every 24 hours 06/04/22 2328 06/05/22 1019   06/04/22 2330  ceFEPIme (MAXIPIME) 2 g in sodium chloride 0.9 % 100 mL IVPB  Status:  Discontinued        2 g 200 mL/hr over 30 Minutes Intravenous Every 24 hours 06/04/22 2324 06/06/22 1627   06/04/22 2330  vancomycin (VANCOREADY) IVPB 1500 mg/300 mL        1,500 mg 150 mL/hr over 120 Minutes Intravenous  Once 06/04/22 2324 06/05/22 0233   06/04/22 2230  cefTRIAXone (ROCEPHIN) 2 g in sodium chloride 0.9 % 100 mL IVPB  Status:  Discontinued        2 g 200 mL/hr over 30 Minutes Intravenous Every 24 hours 06/04/22 2223 06/04/22 2224   06/04/22 2230  azithromycin (ZITHROMAX) 500 mg in sodium chloride 0.9 % 250 mL IVPB  Status:  Discontinued        500 mg 250 mL/hr over 60 Minutes Intravenous Every 24 hours 06/04/22 2223 06/04/22 2224   06/04/22 1730  cefTRIAXone (ROCEPHIN)  2 g in sodium chloride 0.9 % 100 mL IVPB  Status:  Discontinued        2 g 200 mL/hr over 30 Minutes Intravenous Every 24 hours 06/04/22 1725 06/04/22 2321   06/04/22 1730  azithromycin (ZITHROMAX) 500 mg in sodium chloride 0.9 % 250 mL IVPB  Status:  Discontinued        500 mg 250 mL/hr over 60 Minutes Intravenous Every 24 hours 06/04/22 1725 06/04/22 2321        MEDICATIONS: Scheduled Meds:  apixaban  5 mg Per Tube BID   brimonidine  1 drop Both Eyes BID   And   timolol  1 drop Both Eyes BID   cholecalciferol  1,000  Units Per Tube Daily   clorazepate  7.5 mg Per Tube BID   diltiazem  60 mg Per Tube Q8H   feeding supplement (JEVITY 1.5 CAL/FIBER)  237 mL Per Tube 5 X Daily   ferrous sulfate  300 mg Per Tube Q breakfast   flecainide  50 mg Per Tube BID   free water  150 mL Per Tube QID   free water  60 mL Per Tube 5 X Daily   gabapentin  100 mg Per Tube TID   glycopyrrolate  0.1 mg Intravenous TID   hydrALAZINE  100 mg Per Tube TID   insulin aspart  0-9 Units Subcutaneous Q4H   isosorbide dinitrate  10 mg Per Tube TID   latanoprost  1 drop Both Eyes QHS   multivitamin with minerals  1 tablet Per Tube Daily   pantoprazole (PROTONIX) IV  40 mg Intravenous Q24H   polyethylene glycol  17 g Per Tube BID   polyvinyl alcohol  1 drop Both Eyes TID   valbenazine  40 mg Oral Daily   Continuous Infusions:    PRN Meds:.acetaminophen **OR** acetaminophen, ALPRAZolam, melatonin, metoprolol tartrate, morphine injection, oxyCODONE, polyvinyl alcohol   I have personally reviewed following labs and imaging studies  LABORATORY DATA: CBC: Recent Labs  Lab 06/22/22 0316 06/28/22 0941  WBC 10.5 16.5*  HGB 12.4 13.2  HCT 38.0 40.9  MCV 88.2 91.7  PLT 310 242     Basic Metabolic Panel: Recent Labs  Lab 06/22/22 0316 06/23/22 0152 06/25/22 0321 06/28/22 0941  NA 138 144 146* 147*  K 4.7 3.9 4.0 4.2  CL 101 107 110 109  CO2 '26 27 26 29  '$ GLUCOSE 167* 120* 194* 171*  BUN 63* 54* 48* 26*  CREATININE 1.33* 1.25* 1.10* 0.95  CALCIUM 10.6* 10.1 9.3 9.4     GFR: Estimated Creatinine Clearance: 58.9 mL/min (by C-G formula based on SCr of 0.95 mg/dL).  Liver Function Tests: No results for input(s): "AST", "ALT", "ALKPHOS", "BILITOT", "PROT", "ALBUMIN" in the last 168 hours. No results for input(s): "LIPASE", "AMYLASE" in the last 168 hours. No results for input(s): "AMMONIA" in the last 168 hours.  Coagulation Profile: Recent Labs  Lab 06/22/22 0316  INR 1.2     Cardiac Enzymes: No  results for input(s): "CKTOTAL", "CKMB", "CKMBINDEX", "TROPONINI" in the last 168 hours.   BNP (last 3 results) No results for input(s): "PROBNP" in the last 8760 hours.  Lipid Profile: No results for input(s): "CHOL", "HDL", "LDLCALC", "TRIG", "CHOLHDL", "LDLDIRECT" in the last 72 hours.  Thyroid Function Tests: No results for input(s): "TSH", "T4TOTAL", "FREET4", "T3FREE", "THYROIDAB" in the last 72 hours.  Anemia Panel: No results for input(s): "VITAMINB12", "FOLATE", "FERRITIN", "TIBC", "IRON", "RETICCTPCT" in the last 72 hours.  Urine analysis:  Component Value Date/Time   COLORURINE AMBER (A) 06/04/2022 2204   APPEARANCEUR HAZY (A) 06/04/2022 2204   LABSPEC 1.015 06/04/2022 2204   PHURINE 5.0 06/04/2022 2204   GLUCOSEU NEGATIVE 06/04/2022 2204   GLUCOSEU NEGATIVE 10/10/2021 1013   HGBUR NEGATIVE 06/04/2022 2204   BILIRUBINUR NEGATIVE 06/04/2022 2204   KETONESUR NEGATIVE 06/04/2022 2204   PROTEINUR 30 (A) 06/04/2022 2204   UROBILINOGEN 0.2 10/10/2021 1013   NITRITE POSITIVE (A) 06/04/2022 2204   LEUKOCYTESUR MODERATE (A) 06/04/2022 2204    Sepsis Labs: Lactic Acid, Venous    Component Value Date/Time   LATICACIDVEN 1.5 06/05/2022 0038    MICROBIOLOGY: No results found for this or any previous visit (from the past 240 hour(s)).  RADIOLOGY STUDIES/RESULTS: DG Chest Port 1 View  Result Date: 06/28/2022 CLINICAL DATA:  Shortness of breath EXAM: PORTABLE CHEST 1 VIEW COMPARISON:  06/04/2022 and the CT of 06/15/2022. FINDINGS: Numerous leads and wires project over the chest. Midline trachea. Mild cardiomegaly. Atherosclerosis in the transverse aorta. No pleural effusion or pneumothorax. Clear lungs. IMPRESSION: No acute cardiopulmonary disease. Aortic Atherosclerosis (ICD10-I70.0). Electronically Signed   By: Abigail Miyamoto M.D.   On: 06/28/2022 09:56     LOS: 24 days   Oren Binet, MD  [facility]    To contact the attending provider between 7A-7P or  the covering provider during after hours 7P-7A, please log into the web site [email or web address] and access using universal [place] password for that web site. If you do not have the password, please call the hospital operator.  06/28/2022, 12:44 PM

## 2022-06-28 NOTE — Consult Note (Signed)
Consultation Note Date: 06/28/2022   Patient Name: Tara [name]  DOB: 21-Feb-1951  MRN: [id number]  Age / Sex: 71 y.o., female  PCP: Biagio Borg, MD Referring Physician: Jonetta Osgood, MD  Reason for Consultation: Establishing goals of care and Psychosocial/spiritual support  HPI/Patient Profile: 71 y.o. female   admitted on 06/04/2022 with    71 y.o.  female with history of sarcoidosis, tardive dyskinesia, CKD stage IIIa, PAF, HTN, HLD, OSA who presented to the hospital on 11/20 with acute hypoxic respiratory failure due to aspiration PNA in the setting of worsening dysphagia/tardive dyskinesia.  She was also found to have multiple cranial nerve deficits-consistent with neurosarcoidosis and started on high-dose steroids.  Unfortunately-in spite of resumption of her benzodiazepine/high-dose IV steroids-she continued to have severe dysphagia and required PEG tube insertion on 12/8.   Patient is with significant overall failure to thrive.   Patient faces treatment option decisions, advanced directive decisions and anticipatory care needs.  Clinical Assessment and Goals of Care:  This NP Wadie Lessen reviewed medical records, received report from team, assessed the patient and then meet at the patient's bedside  to discuss diagnosis, prognosis, GOC, EOL wishes disposition and options.  Concept of Palliative Care was introduced as specialized medical care for people and their families living with serious illness.  If focuses on providing relief from the symptoms and stress of a serious illness.  The goal is to improve quality of life for both the patient and the family  Values and goals of care important to patient and family were attempted to be elicited.  A  discussion was had today regarding advanced directives.  Concepts specific to code status, artifical feeding and hydration, continued IV antibiotics  and rehospitalization was had.    Education offered on patient's overall failure to thrive and significant dysphagia.   Although she is had a PEG tube placed patient remains high risk for aspiration secondary to her inability to handle her oral secretions.  The difference between a aggressive medical intervention path  and a palliative comfort care path for this patient at this time was had.     Education offered on hospice benefit; philosophy and eligibility  Patient is weak but able to participate in today's conversation.  After conversation regarding CODE STATUS she  verbalizes desire for DNR/DNI.    Ultimately she just wants to get back to her home and her 4 cats  I spoke by phone to her sister/HPOA/ [name] and Health Advocate/ Billy Fischer and all above was further discussed with both of them.    Billy Fischer has a clear understanding of the patient's overall failure to thrive and likely associated poor prognosis. Patient's sister lives out of town and has not seen her sister in quite some time, although she is the documented H POA, she struggle a bit with her sister's poor health status.  I strongly encouraged her to visit in person.    Patient is high risk for decompensation  Both  voice that they hope  for patient to be able to ultimately return home.  However at this time Anderson [facility] Harris/health advocate is still in process of preparing the home.    Plan is for SNF for short term prior to transition home.  At that time all would be open to hospice services.     Questions and concerns addressed. All  encouraged to call with questions or concerns.     PMT will continue to support holistically.    Patient's sister is documented HPOA      SUMMARY OF RECOMMENDATIONS    Code Status/Advance Care Planning: DNR-documented today after discussion with patient , health advocate ands sister support this decision.    Palliative Prophylaxis:  Aspiration, Bowel Regimen,  Delirium Protocol, Frequent Pain Assessment, and Oral Care    Psycho-social/Spiritual:  Desire for further Chaplaincy support:no Additional Recommendations: Education on Hospice  Prognosis:  Unable to determine  Discharge Planning: To Be Determined      Primary Diagnoses: Present on Admission:  Sepsis due to pneumonia ([place])  Lactic acidosis  Acute kidney injury superimposed on chronic kidney disease (HCC)  Acute liver failure  Elevated troponin  Hypernatremia  Dehydration  Hyperlipidemia  Sarcoidosis  Essential hypertension  Obstructive sleep apnea   I have reviewed the medical record, interviewed the patient and family, and examined the patient. The following aspects are pertinent.  Past Medical History:  Diagnosis Date   Anemia    on meds   ANKLE PAIN, LEFT 04/01/2008   ANXIETY 04/17/2007   on meds   Arthritis    generalized   Cataract    bilateral sx   Chronic kidney disease    stage 3   Colon polyp    COLONIC POLYPS, HX OF 08/01/2007   CONTUSIONS, MULTIPLE 04/01/2009   DEPRESSION 04/17/2007   on meds   DIZZINESS 08/01/2007   DYSPNEA 08/01/2007   with exertion   Enlargement of lymph nodes 08/13/2007   Excessive involuntary blinking    per pt,going on since 2018   Glaucoma    on meds   GLUCOSE INTOLERANCE 08/01/2007   Hypercalcemia due to sarcoidosis 2014   HYPERLIPIDEMIA 08/01/2007   no meds   HYPERSOMNIA 07/28/2008   HYPERTENSION 04/17/2007   Impaired glucose tolerance 03/23/2011   JOINT EFFUSION, LEFT KNEE 06/02/2010   Loose body in knee 04/01/2009   pt not sure?   Metabolic encephalopathy 03/50/0938-18/2993   Migraines    "stopped 3-4 yr ago" (07/23/2012)   Morbid obesity ([place]) 04/20/2007   OTHER DISEASES OF LUNG NOT ELSEWHERE CLASSIFIED 08/01/2007   Pain in joint, lower leg 04/01/2009   PERIPHERAL EDEMA 04/21/2009   Pre-diabetes    Sarcoidosis 09/25/2007   Seasonal allergies    SHOULDER PAIN, LEFT 04/01/2009   Sleep apnea    does  not use Cpap   Social History   Socioeconomic History   Marital status: Widowed    Spouse name: Not on file   Number of children: Not on file   Years of education: Not on file   Highest education level: Not on file  Occupational History   Occupation: Designer, jewellery  Tobacco Use   Smoking status: Never   Smokeless tobacco: Never   Tobacco comments:    Never smoke 12/25/21  Vaping Use   Vaping Use: Never used  Substance and Sexual Activity   Alcohol use: Yes    Alcohol/week: 1.0 - 2.0 standard drink of alcohol    Types: 1 - 2 Standard drinks or equivalent per week  Drug use: No   Sexual activity: Not Currently    Birth control/protection: Post-menopausal    Comment: 1st intercourse 82 yo-1 partner  Other Topics Concern   Not on file  Social History Narrative   Lives with 4 cats   Social Determinants of Health   Financial Resource Strain: Not on file  Food Insecurity: No Food Insecurity (06/14/2022)   Hunger Vital Sign    Worried About Running Out of Food in the Last Year: Never true    Ran Out of Food in the Last Year: Never true  Transportation Needs: No Transportation Needs (06/14/2022)   PRAPARE - Hydrologist (Medical): No    Lack of Transportation (Non-Medical): No  Physical Activity: Not on file  Stress: Not on file  Social Connections: Not on file   Family History  Problem Relation Age of Onset   Diabetes Mother    Hypertension Mother    Stroke Mother    Heart disease Father 62   Hypertension Father    Hypertension Sister    Asthma Sister    Gout Sister    Colon cancer Neg Hx    Breast cancer Neg Hx    Esophageal cancer Neg Hx    Stomach cancer Neg Hx    Rectal cancer Neg Hx    Colon polyps Neg Hx    Scheduled Meds:  apixaban  5 mg Per Tube BID   brimonidine  1 drop Both Eyes BID   And   timolol  1 drop Both Eyes BID   cholecalciferol  1,000 Units Per Tube Daily   clorazepate  7.5 mg Per Tube BID   diltiazem   60 mg Per Tube Q8H   feeding supplement (JEVITY 1.5 CAL/FIBER)  237 mL Per Tube 5 X Daily   ferrous sulfate  300 mg Per Tube Q breakfast   flecainide  50 mg Per Tube BID   free water  150 mL Per Tube QID   free water  60 mL Per Tube 5 X Daily   gabapentin  100 mg Per Tube TID   glycopyrrolate  0.1 mg Intravenous TID   hydrALAZINE  100 mg Per Tube TID   insulin aspart  0-9 Units Subcutaneous Q4H   isosorbide dinitrate  10 mg Per Tube TID   latanoprost  1 drop Both Eyes QHS   multivitamin with minerals  1 tablet Per Tube Daily   pantoprazole (PROTONIX) IV  40 mg Intravenous Q24H   polyethylene glycol  17 g Per Tube BID   polyvinyl alcohol  1 drop Both Eyes TID   valbenazine  40 mg Oral Daily   Continuous Infusions: PRN Meds:.acetaminophen **OR** acetaminophen, ALPRAZolam, melatonin, metoprolol tartrate, morphine injection, oxyCODONE, polyvinyl alcohol Medications Prior to Admission:  Prior to Admission medications   Medication Sig Start Date End Date Taking? Authorizing Provider  amoxicillin (AMOXIL) 500 MG tablet Take 2,000 mg by mouth once. Prior to dental appointments 08/03/19  Yes [provider]  antiseptic oral rinse (BIOTENE) LIQD 15 mLs by Mouth Rinse route 3 (three) times daily as needed for dry mouth or mouth pain.   Yes [provider]  apixaban (ELIQUIS) 5 MG TABS tablet TAKE 1 TABLET(5 MG) BY MOUTH TWICE DAILY Patient taking differently: Take 5 mg by mouth 2 (two) times daily. 05/10/22  Yes Branch, Royetta Crochet, MD  brimonidine-timolol (COMBIGAN) 0.2-0.5 % ophthalmic solution Place 1 drop into both eyes every 12 (twelve) hours. 12/02/19  Yes [provider]  carboxymethylcellulose (REFRESH PLUS) 0.5 % SOLN Place 1 drop into both eyes 3 (three) times daily as needed (Dry eyes). RETAIN eye drops instead of refresh   Yes [provider]  cholecalciferol (VITAMIN D) 25 MCG (1000 UNIT) tablet Take 1,000 Units by mouth daily.   Yes [provider]  clorazepate (TRANXENE) 7.5 MG tablet Take 7.5 mg by mouth 2 (two) times daily.   Yes [provider]  desvenlafaxine (PRISTIQ) 100 MG 24 hr tablet Take 100 mg by mouth at bedtime.   Yes [provider]  diltiazem (CARDIZEM CD) 240 MG 24 hr capsule Take 1 capsule (240 mg total) by mouth daily. 03/16/22  Yes Biagio Borg, MD  ferrous sulfate 325 (65 FE) MG tablet Take 325 mg by mouth daily with breakfast.   Yes [provider]  flecainide (TAMBOCOR) 50 MG tablet Take 50 mg by mouth 2 (two) times daily.   Yes [provider]  hydrALAZINE (APRESOLINE) 100 MG tablet Take 1 tablet (100 mg total) by mouth 3 (three) times daily. 04/09/22  Yes Rai, Ripudeep K, MD  INGREZZA 40 MG capsule Take 40 mg by mouth daily. 03/14/22  Yes [provider]  isosorbide mononitrate (IMDUR) 30 MG 24 hr tablet Take 1 tablet (30 mg total) by mouth daily. 04/09/22  Yes Rai, Ripudeep K, MD  latanoprost (XALATAN) 0.005 % ophthalmic solution Place 1 drop into both eyes at bedtime. 08/25/16  Yes [provider]  Melatonin 5 MG TABS Take 10 mg by mouth at bedtime.   Yes [provider]  Multiple Vitamins-Minerals (MULTIVITAMIN WITH MINERALS) tablet Take 1 tablet by mouth daily.   Yes [provider]  Omega 3 1200 MG CAPS Take 2,400 mg by mouth at bedtime.   Yes [provider]  senna (SENOKOT) 8.6 MG tablet Take 1 tablet by mouth daily.   Yes [provider]  baclofen (LIORESAL) 10 MG tablet Take 5 mg by mouth 2 (two) times daily as needed (Headache). 09/01/19   [provider]   Allergies  Allergen Reactions   Sulfa Antibiotics Rash   Fluoxetine Hcl Other (See Comments)    (PROZAC) Suicidal thoughts   Review of Systems  Neurological:  Positive for weakness.    Physical Exam Constitutional:      Appearance: She is cachectic. She is ill-appearing.  Neck:     Comments: Audible throat secretions  Cardiovascular:     Rate and  Rhythm: Tachycardia present.  Musculoskeletal:     Comments: Generalized weakness and muscle atrophy  Skin:    General: Skin is warm and dry.  Neurological:     Mental Status: She is lethargic.     Vital Signs: BP 124/69 (BP Location: Right Arm)   Pulse (!) 103   Temp 97.8 F (36.6 C) (Oral)   Resp (!) 26   Ht '5\' 6"'$  (1.676 m)   Wt 77.9 kg   SpO2 97%   BMI 27.72 kg/m  Pain Scale: PAINAD POSS *See Group Information*: S-Acceptable,Sleep, easy to arouse Pain Score: 0-No pain   SpO2: SpO2: 97 % O2 Device:SpO2: 97 % O2 Flow Rate: .O2 Flow Rate (L/min): 2 L/min  IO: Intake/output summary:  Intake/Output Summary (Last 24 hours) at 06/28/2022 1235 Last data filed at 06/27/2022 2000 Gross per 24 hour  Intake 3791 ml  Output --  Net 3791 ml    LBM: Last BM Date : 06/27/22 Baseline Weight: Weight: 77.1 kg Most recent weight: Weight:  77.9 kg     Palliative Assessment/Data:   30 % at best   Discussed with Dr Sloan Leiter and [facility] team   Time In: 1200 Time Out: 1330 Time Total: 90 minutes Greater than 50%  of this time was spent counseling and coordinating care related to the above assessment and plan.  Signed by: Wadie Lessen, NP   Please contact Palliative Medicine Team phone at [phone] for questions and concerns.  For individual provider: See Shea Evans

## 2022-06-28 NOTE — Progress Notes (Signed)
Speech Language Pathology Treatment: Dysphagia  Patient Details Name: Tara [name] MRN: [id number] DOB: 05-03-1951 Today's Date: 06/28/2022 Time: 2751-7001 SLP Time Calculation (min) (ACUTE ONLY): 18 min  Assessment / Plan / Recommendation Clinical Impression  Pt seen for dysphagia f/u tx with oral care implemented prior to any PO intake.  Pt somewhat resistant to oral care biting onto swab/toothbrush during oral care completion.  Pt given min-mod verbal/tactile cues during oral care with participation slightly improved.  Oral pooling of saliva noted prior to intake of ice/nectar-thickened liquids with questioning if pt is maintaining oral secretions.  Pt held boluses orally and exhibited decreased oral manipulation throughout session despite mod verbal/tactile cues.  Lethargy intermittent during trial.  Delayed cough noted after intake (during SLP charting in room, 5+ min later) with delayed initiation of the swallow also observed.  Recommend pt continue NPO status at this time.  ST will continue to follow for PO readiness in acute setting.  HPI HPI: Pt is a 71 year old female admitted from SNF with severe sepsis and respiratory failure due to PNA. CXR shows hazy airspace opacity at both lung bases, left greater than right, suspicious for pneumonia or aspiration pneumonitis. PMH includes: tardive dyskinesia (started tx in August through [facility] movement [facility]), CKD, depression, sarcoidosis, dyslipidemia, and hypertension; MBS completed recommending NPO status on 06/19/22.  ST f/u for dysphagia tx      SLP Plan  Continue with current plan of care      Recommendations for follow up therapy are one component of a multi-disciplinary discharge planning process, led by the attending physician.  Recommendations may be updated based on patient status, additional functional criteria and insurance authorization.    Recommendations  Diet recommendations: NPO Medication Administration: Via alternative  means                Oral Care Recommendations: Oral care QID;Staff/trained caregiver to provide oral care Follow Up Recommendations: Skilled nursing-short term rehab (<3 hours/day) Assistance recommended at discharge: Frequent or constant Supervision/Assistance SLP Visit Diagnosis: Dysphagia, oropharyngeal phase (R13.12) Plan: Continue with current plan of care           Elvina Sidle, M.S., CCC-SLP  06/28/2022, 2:37 PM

## 2022-06-29 ENCOUNTER — Inpatient Hospital Stay (HOSPITAL_COMMUNITY): Payer: BC Managed Care – PPO

## 2022-06-29 DIAGNOSIS — G4733 Obstructive sleep apnea (adult) (pediatric): Secondary | ICD-10-CM | POA: Diagnosis not present

## 2022-06-29 DIAGNOSIS — I480 Paroxysmal atrial fibrillation: Secondary | ICD-10-CM | POA: Diagnosis not present

## 2022-06-29 DIAGNOSIS — A419 Sepsis, unspecified organism: Secondary | ICD-10-CM | POA: Diagnosis not present

## 2022-06-29 DIAGNOSIS — J189 Pneumonia, unspecified organism: Secondary | ICD-10-CM | POA: Diagnosis not present

## 2022-06-29 LAB — CBC
HCT: 32.4 % — ABNORMAL LOW (ref 36.0–46.0)
Hemoglobin: 10.3 g/dL — ABNORMAL LOW (ref 12.0–15.0)
MCH: 29.7 pg (ref 26.0–34.0)
MCHC: 31.8 g/dL (ref 30.0–36.0)
MCV: 93.4 fL (ref 80.0–100.0)
Platelets: 170 10*3/uL (ref 150–400)
RBC: 3.47 MIL/uL — ABNORMAL LOW (ref 3.87–5.11)
RDW: 16.8 % — ABNORMAL HIGH (ref 11.5–15.5)
WBC: 13.7 10*3/uL — ABNORMAL HIGH (ref 4.0–10.5)
nRBC: 0 % (ref 0.0–0.2)

## 2022-06-29 LAB — BASIC METABOLIC PANEL
Anion gap: 6 (ref 5–15)
BUN: 26 mg/dL — ABNORMAL HIGH (ref 8–23)
CO2: 29 mmol/L (ref 22–32)
Calcium: 8.4 mg/dL — ABNORMAL LOW (ref 8.9–10.3)
Chloride: 109 mmol/L (ref 98–111)
Creatinine, Ser: 0.99 mg/dL (ref 0.44–1.00)
GFR, Estimated: >60 mL/min (ref >60)
Glucose, Bld: 194 mg/dL — ABNORMAL HIGH (ref 70–99)
Potassium: 4 mmol/L (ref 3.5–5.1)
Sodium: 144 mmol/L (ref 135–145)

## 2022-06-29 LAB — PROCALCITONIN: Procalcitonin: 0.17 ng/mL

## 2022-06-29 LAB — GLUCOSE, CAPILLARY
Glucose-Capillary: 172 mg/dL — ABNORMAL HIGH (ref 70–99)
Glucose-Capillary: 174 mg/dL — ABNORMAL HIGH (ref 70–99)
Glucose-Capillary: 184 mg/dL — ABNORMAL HIGH (ref 70–99)
Glucose-Capillary: 170 mg/dL — ABNORMAL HIGH (ref 70–99)
Glucose-Capillary: 180 mg/dL — ABNORMAL HIGH (ref 70–99)

## 2022-06-29 NOTE — TOC Progression Note (Addendum)
Transition of Care [facility]) - Progression Note    Patient Details  Name: Tara [name] MRN: [id number] Date of Birth: Mar 28, 1951  Transition of Care [facility]) CM/SW [place], LCSW Phone Number: 06/29/2022, 2:21 PM  Clinical Narrative:    Per Rober Minion, insurance stated they will need authorization started on Monday since patient will not be discharging over the weekend. Will hope for updated PT note on Sunday; left vm for PT office.    Expected Discharge Plan: [place] Barriers to Discharge: Continued Medical Work up, SNF Pending bed offer, Ship broker  Expected Discharge Plan and Services Expected Discharge Plan: [place] In-house Referral: Clinical Social Work   Post Acute Care Choice: [place] Living arrangements for the past 2 months: [name] Determinants of Health (SDOH) Interventions    Readmission Risk Interventions    06/14/2022    4:37 PM  Readmission Risk Prevention Plan  Transportation Screening Complete  Medication Review Press photographer) Complete  PCP or Specialist appointment within 3-5 days of discharge Complete  HRI or [place] Complete  SW Recovery Care/Counseling Consult Complete  Palliative Care Screening Not [place] Complete

## 2022-06-29 NOTE — Progress Notes (Signed)
Occupational Therapy Treatment Patient Details Name: Tara [name] MRN: [id number] DOB: 05-28-1951 Today's Date: 06/29/2022   History of present illness Pt presented 11/20 with acute hypoxic respiratory failure due to aspiration PNA in the setting of worsening dysphagia/tardive dyskinesia.  She was also found to have multiple cranial nerve deficits-consistent with neurosarcoidosis and started on high-dose steroids.  Unfortunately-in spite of resumption of her benzodiazepine/high-dose IV steroids-she continued to have severe dysphagia and required PEG tube insertion on 12/8   PMH significant for CKD, sarcoidosis, HTN, obesity,OSA on CPAP, atrial fibrillation on Eliquis, hx of presyncope seen in ed 3 x since 8/29.   OT comments  Patient received in supine and appeared lethargic but agreeable to OT session. Patient seen to address sitting balance on EOB with patient requiring min assist with moments of min guard and required mod assist as patient fatigued. Patient increased sitting tolerance to 15 minutes before requiring to return to supine. Discharge recommendation to SNF continues to be appropriate due to current level of care required to progress with rehab.    Recommendations for follow up therapy are one component of a multi-disciplinary discharge planning process, led by the attending physician.  Recommendations may be updated based on patient status, additional functional criteria and insurance authorization.    Follow Up Recommendations  Skilled nursing-short term rehab (<3 hours/day)     Assistance Recommended at Discharge Frequent or constant Supervision/Assistance  Patient can return home with the following  Two people to help with walking and/or transfers;Two people to help with bathing/dressing/bathroom;Assistance with cooking/housework;Assistance with feeding;Direct supervision/assist for medications management;Direct supervision/assist for financial management;Assist for  transportation;Help with stairs or ramp for entrance   Equipment Recommendations  Other (comment) (defer to next venue)    Recommendations for Other Services      Precautions / Restrictions Precautions Precautions: Fall;Other (comment) Precaution Comments: PEG Restrictions Weight Bearing Restrictions: No       Mobility Bed Mobility Overal bed mobility: Needs Assistance Bed Mobility: Sit to Supine, Supine to Sit     Supine to sit: Mod assist, HOB elevated Sit to supine: Max assist   General bed mobility comments: use of rails to assist to get to EOB with Assistance for trunk and BLEs to return to supine due to fatigue    Transfers Overall transfer level: Needs assistance Equipment used: None Transfers: Sit to/from Stand Sit to Stand: Max assist           General transfer comment: sit to stands performed from EOB to move towards [facility] with difficulty clearing bottom off bed     Balance Overall balance assessment: Needs assistance Sitting-balance support: Feet unsupported, Bilateral upper extremity supported Sitting balance-Leahy Scale: Poor Sitting balance - Comments: able to sit ON EOB for 15 minutes with min to mod assist for sitting balance as patient fatigued Postural control: Posterior lean Standing balance support: Bilateral upper extremity supported, Reliant on assistive device for balance Standing balance-Leahy Scale: Zero Standing balance comment: 2 sit to stands performed from EOB to move towards [facility] with patient unable to come to complete stand                           ADL either performed or assessed with clinical judgement   ADL Overall ADL's : Needs assistance/impaired     Grooming: Wash/dry hands;Wash/dry face;Supervision/safety;Bed level Grooming Details (indicate cue type and reason): able to wash face with left hand  General ADL Comments: focused on sitting balance on EOB     Extremity/Trunk Assessment Upper Extremity Assessment RUE Deficits / Details: 3+/5 for strength grossly, pt with hypertonicity LUE Deficits / Details: 4-/5 for strength grossly, slight hypertonicity however not as strong as Rt UE.            Vision       Perception     Praxis      Cognition Arousal/Alertness: Lethargic Behavior During Therapy: Flat affect Overall Cognitive Status: No family/caregiver present to determine baseline cognitive functioning Area of Impairment: Attention, Safety/judgement, Awareness, Following commands                   Current Attention Level: Sustained   Following Commands: Follows one step commands consistently Safety/Judgement: Decreased awareness of deficits, Decreased awareness of safety   Problem Solving: Slow processing, Requires verbal cues, Requires tactile cues General Comments: increased time to follow commands        Exercises Other Exercises Other Exercises: reaching exercises performed while seated on EOB    Shoulder Instructions       General Comments      Pertinent Vitals/ Pain       Pain Assessment Pain Assessment: Faces Faces Pain Scale: Hurts a little bit Pain Location: " my face hurts" Pain Descriptors / Indicators: Discomfort, Grimacing Pain Intervention(s): Limited activity within patient's tolerance, Monitored during session  Home Living                                          Prior Functioning/Environment              Frequency  Min 2X/week        Progress Toward Goals  OT Goals(current goals can now be found in the care plan section)  Progress towards OT goals: Progressing toward goals  Acute Rehab OT Goals Patient Stated Goal: none stated OT Goal Formulation: With patient Time For Goal Achievement: 06/21/22 Potential to Achieve Goals: Good ADL Goals Pt Will Perform Eating: with set-up;sitting Pt Will Perform Grooming: with set-up;sitting Pt Will Perform  Upper Body Dressing: with min guard assist;sitting Pt Will Perform Lower Body Dressing: with mod assist;sit to/from stand Pt Will Transfer to Toilet: stand pivot transfer;bedside commode;with min assist  Plan Discharge plan remains appropriate;Frequency remains appropriate    Co-evaluation                 AM-PAC OT "6 Clicks" Daily Activity     Outcome Measure   Help from another person eating meals?: Total Help from another person taking care of personal grooming?: A Lot Help from another person toileting, which includes using toliet, bedpan, or urinal?: Total Help from another person bathing (including washing, rinsing, drying)?: A Lot Help from another person to put on and taking off regular upper body clothing?: A Lot Help from another person to put on and taking off regular lower body clothing?: Total 6 Click Score: 9    End of Session Equipment Utilized During Treatment: Oxygen  OT Visit Diagnosis: Muscle weakness (generalized) (M62.81);Other symptoms and signs involving cognitive function;Feeding difficulties (R63.3);History of falling (Z91.81);Other symptoms and signs involving the nervous system (R29.898)   Activity Tolerance Patient limited by fatigue   Patient Left in bed;with call bell/phone within reach;with bed alarm set   Nurse Communication Mobility status        Time: [phone]  OT Time Calculation (min): 24 min  Charges: OT General Charges $OT Visit: 1 Visit OT Treatments $Therapeutic Activity: 23-37 mins  Lodema Hong, [place]  Office [place] 06/29/2022, 1:35 PM

## 2022-06-29 NOTE — Progress Notes (Signed)
PROGRESS NOTE        PATIENT DETAILS Name: Tara [name] Age: 71 y.o. Sex: female Date of Birth: 12-30-1950 Admit Date: 06/04/2022 Admitting Physician Quintella Baton, MD HYI:FOYD, Hunt Oris, MD  Brief Summary: Patient is a 71 y.o.  female with history of sarcoidosis, tardive dyskinesia, CKD stage IIIa, PAF, HTN, HLD, OSA who presented to the hospital on 11/20 with acute hypoxic respiratory failure due to aspiration PNA in the setting of worsening dysphagia/tardive dyskinesia.  She was also found to have multiple cranial nerve deficits-consistent with neurosarcoidosis and started on high-dose steroids.  Unfortunately-in spite of resumption of her benzodiazepine/high-dose IV steroids-she continued to have severe dysphagia and required PEG tube insertion on 12/8.  See below for further details.  Significant events: 11/20>> admit to [facility]-hypoxia-aspiration pneumonia. 12/04>> started on high-dose Solu-Medrol for cranial nerve deficits/secondary to neurosarcoid. 12/14>> accumulating secretions-transmitted upper airway sounds-leukocytosis-suspected aspiration pneumonia-Unasyn started.  Significant studies: 11/20>> CXR: Left> right PNA 11/20>> CT head: No acute intracranial abnormality 11/21>> echo: EF 55%. 11/29>> MRI brain wo contrast: No acute intracranial abnormality 11/30>> MRI C-spine: No acute abnormality 11/30>> MRI brain w contrast: No pachymeningeal contrast-enhancement 12/01>> CT chest/abdomen/pelvis: No evidence of primary malignancy/metastatic disease 12/08>> EEG: No seizures. 12/14>> CXR: No PNA  Significant microbiology data: 11/20>> blood culture: No growth 11/20>> urine culture: Multiple species 11/20>> COVID/influenza PCR: Negative  Procedures: 11/22>>Cortrak tube placement 12/08>> PEG tube placed by IR  Consults: Neuro ID  Subjective: Significantly less transmitted upper airway sounds-not accumulating as much secretions as  yesterday.  Objective: Vitals: Blood pressure (!) 101/51, pulse 66, temperature (!) 100.7 F (38.2 C), temperature source Axillary, resp. rate 15, height '5\' 6"'$  (1.676 m), weight 78 kg, SpO2 98 %.   Exam: Gen Exam:Alert awake-not in any distress HEENT:atraumatic, normocephalic Chest: B/L clear to auscultation anteriorly-less transmitted upper airway sounds compared to 12/14 CVS:S1S2 regular Abdomen:soft non tender, non distended Extremities:no edema Neurology: Non focal-generalized weakness Skin: no rash  Pertinent Labs/Radiology:    Latest Ref Rng & Units 06/29/2022    3:00 AM 06/28/2022    9:41 AM 06/22/2022    3:16 AM  CBC  WBC 4.0 - 10.5 K/uL 13.7  16.5  10.5   Hemoglobin 12.0 - 15.0 g/dL 10.3  13.2  12.4   Hematocrit 36.0 - 46.0 % 32.4  40.9  38.0   Platelets 150 - 400 K/uL 170  242  310     Lab Results  Component Value Date   NA 144 06/29/2022   K 4.0 06/29/2022   CL 109 06/29/2022   CO2 29 06/29/2022      Assessment/Plan: Severe sepsis with acute hypoxic respiratory failure due to aspiration PNA Overall improved-had completed a course of antibiotics but on 12/14 had another aspiration event.   Overall improved today-continue attempts at pulmonary toileting/mobilization is much as possible Continue antibiotics-we will plan on a 5-day course-currently on 2/5 days.    Oropharyngeal dysphagia Felt to be due to flare of tardive dyskinesia and neurosarcoidosis.   No significant improvement after restarting Tranxene/Ingrezza for tardive dyskinesia and IV steroids for neurosarcoidosis.   PEG tube ultimately placed on 12/8-previously had cortrak tube Seems to be tolerating tube feeds well.  Neurosarcoidosis flare With multiple cranial nerve deficits Completed 5 days of Solu-Medrol  Neurology recommending outpatient immunology follow-up.  Tardive dyskinesia flare Followed by movement clinic at [facility] Felt to be worse after stopping benzos-now overall  improved with some minimal involuntary movements-continue Tranxene/Ingrezza. Per patient-has a follow-up with neurology at [facility] on 12/18.  Facial Pain-?Trigeminal Neuralgia Continues to have intermittent facial pain-no improvement with Tegretol-now on Neurontin.   Recommendations from neurology are for outpatient follow-up with primary neurologist with gradual up titration of Neurontin as tolerated.    AKI on CKD stage IIIa AKI hemodynamically mediated Resolved.  Out of care.  Transaminitis Improved Likely related to sepsis Follow LFTs periodically  Minimally elevated troponin Likely demand ischemia Echo with stable EF  Persistent Afib Remains in Afib for past several days Rate controlled Continue Cardizem Continue Eliquis. Discussed with cardiologist-Dr. Percival Spanish 12/09-continue flecainide until follow-up with outpatient primary cardiologist.  HTN BP stable-continue hydralazine/Cardizem/Imdur  OSA Avoid CPAP given significant issues with dysphagia/oral secretions  Left lower lobe groundglass 4 mm nodule Further workup deferred to the outpatient setting.  Palliative care/FTT Although overall improved-continues to have issues with dysphagia Remains at risk for aspiration events Appreciate palliative care input-now a DNR-ongoing goals of care discussion Plans are to discharge temporarily to SNF-family will ultimately take her home with hospice.  Nutrition Status: Nutrition Problem: Inadequate oral intake Etiology: inability to eat Signs/Symptoms: NPO status Interventions: Tube feeding  BMI: Estimated body mass index is 27.75 kg/m as calculated from the following:   Height as of this encounter: '5\' 6"'$  (1.676 m).   Weight as of this encounter: 78 kg.   Code status:   Code Status: DNR   DVT Prophylaxis: SCDs Start: 06/04/22 2221 apixaban (ELIQUIS) tablet 5 mg   Family Communication: Sister Tara [name] [phone] -updated over the phone  12/14  Disposition Plan: Status is: Inpatient Remains inpatient appropriate because: Severity of illness.   Planned Discharge Destination:Skilled nursing facility when bed available.   Diet: Diet Order             Diet NPO time specified  Diet effective midnight                     Antimicrobial agents: Anti-infectives (From admission, onward)    Start     Dose/Rate Route Frequency Ordered Stop   06/28/22 1400  Ampicillin-Sulbactam (UNASYN) 3 g in sodium chloride 0.9 % 100 mL IVPB        3 g 200 mL/hr over 30 Minutes Intravenous Every 6 hours 06/28/22 1303     06/22/22 0600  ceFAZolin (ANCEF) IVPB 2g/100 mL premix        2 g 200 mL/hr over 30 Minutes Intravenous To Radiology 06/20/22 1400 06/22/22 1259   06/06/22 1730  ceFEPIme (MAXIPIME) 2 g in sodium chloride 0.9 % 100 mL IVPB        2 g 200 mL/hr over 30 Minutes Intravenous Every 12 hours 06/06/22 1627 06/09/22 1649   06/05/22 2200  vancomycin (VANCOREADY) IVPB 750 mg/150 mL  Status:  Discontinued        750 mg 150 mL/hr over 60 Minutes Intravenous Every 24 hours 06/04/22 2328 06/05/22 1019   06/04/22 2330  ceFEPIme (MAXIPIME) 2 g in sodium chloride 0.9 % 100 mL IVPB  Status:  Discontinued        2 g 200 mL/hr over 30 Minutes Intravenous Every 24 hours 06/04/22 2324 06/06/22 1627   06/04/22 2330  vancomycin (VANCOREADY) IVPB 1500 mg/300 mL        1,500 mg 150 mL/hr over 120 Minutes Intravenous  Once 06/04/22 2324 06/05/22 0233   06/04/22 2230  cefTRIAXone (ROCEPHIN) 2 g in sodium chloride 0.9 % 100 mL IVPB  Status:  Discontinued        2 g 200 mL/hr over 30 Minutes Intravenous Every 24 hours 06/04/22 2223 06/04/22 2224   06/04/22 2230  azithromycin (ZITHROMAX) 500 mg in sodium chloride 0.9 % 250 mL IVPB  Status:  Discontinued        500 mg 250 mL/hr over 60 Minutes Intravenous Every 24 hours 06/04/22 2223 06/04/22 2224   06/04/22 1730  cefTRIAXone (ROCEPHIN) 2 g in sodium chloride 0.9 % 100 mL IVPB  Status:   Discontinued        2 g 200 mL/hr over 30 Minutes Intravenous Every 24 hours 06/04/22 1725 06/04/22 2321   06/04/22 1730  azithromycin (ZITHROMAX) 500 mg in sodium chloride 0.9 % 250 mL IVPB  Status:  Discontinued        500 mg 250 mL/hr over 60 Minutes Intravenous Every 24 hours 06/04/22 1725 06/04/22 2321        MEDICATIONS: Scheduled Meds:  apixaban  5 mg Per Tube BID   brimonidine  1 drop Both Eyes BID   And   timolol  1 drop Both Eyes BID   cholecalciferol  1,000 Units Per Tube Daily   clorazepate  7.5 mg Per Tube BID   diltiazem  60 mg Per Tube Q8H   feeding supplement (JEVITY 1.5 CAL/FIBER)  237 mL Per Tube 5 X Daily   ferrous sulfate  300 mg Per Tube Q breakfast   flecainide  50 mg Per Tube BID   free water  250 mL Per Tube QID   free water  60 mL Per Tube 5 X Daily   gabapentin  100 mg Per Tube TID   glycopyrrolate  0.1 mg Intravenous TID   hydrALAZINE  100 mg Per Tube TID   insulin aspart  0-9 Units Subcutaneous Q4H   isosorbide dinitrate  10 mg Per Tube TID   latanoprost  1 drop Both Eyes QHS   multivitamin with minerals  1 tablet Per Tube Daily   pantoprazole (PROTONIX) IV  40 mg Intravenous Q24H   polyethylene glycol  17 g Per Tube BID   polyvinyl alcohol  1 drop Both Eyes TID   valbenazine  40 mg Oral Daily   Continuous Infusions:  ampicillin-sulbactam (UNASYN) IV 3 g (06/29/22 1003)     PRN Meds:.acetaminophen **OR** acetaminophen, ALPRAZolam, melatonin, metoprolol tartrate, morphine injection, oxyCODONE, polyvinyl alcohol   I have personally reviewed following labs and imaging studies  LABORATORY DATA: CBC: Recent Labs  Lab 06/28/22 0941 06/29/22 0300  WBC 16.5* 13.7*  HGB 13.2 10.3*  HCT 40.9 32.4*  MCV 91.7 93.4  PLT 242 170     Basic Metabolic Panel: Recent Labs  Lab 06/23/22 0152 06/25/22 0321 06/28/22 0941 06/29/22 0300  NA 144 146* 147* 144  K 3.9 4.0 4.2 4.0  CL 107 110 109 109  CO2 '27 26 29 29  '$ GLUCOSE 120* 194* 171* 194*   BUN 54* 48* 26* 26*  CREATININE 1.25* 1.10* 0.95 0.99  CALCIUM 10.1 9.3 9.4 8.4*     GFR: Estimated Creatinine Clearance: 56.6 mL/min (by C-G formula based on SCr of 0.99 mg/dL).  Liver Function Tests: No results for input(s): "AST", "ALT", "ALKPHOS", "BILITOT", "PROT", "ALBUMIN" in the last 168 hours. No results for input(s): "LIPASE", "AMYLASE" in the last 168 hours. No results for input(s): "AMMONIA" in the last 168 hours.  Coagulation Profile: No results for input(s): "  INR", "PROTIME" in the last 168 hours.   Cardiac Enzymes: No results for input(s): "CKTOTAL", "CKMB", "CKMBINDEX", "TROPONINI" in the last 168 hours.   BNP (last 3 results) No results for input(s): "PROBNP" in the last 8760 hours.  Lipid Profile: No results for input(s): "CHOL", "HDL", "LDLCALC", "TRIG", "CHOLHDL", "LDLDIRECT" in the last 72 hours.  Thyroid Function Tests: No results for input(s): "TSH", "T4TOTAL", "FREET4", "T3FREE", "THYROIDAB" in the last 72 hours.  Anemia Panel: No results for input(s): "VITAMINB12", "FOLATE", "FERRITIN", "TIBC", "IRON", "RETICCTPCT" in the last 72 hours.  Urine analysis:    Component Value Date/Time   COLORURINE AMBER (A) 06/04/2022 2204   APPEARANCEUR HAZY (A) 06/04/2022 2204   LABSPEC 1.015 06/04/2022 2204   PHURINE 5.0 06/04/2022 2204   GLUCOSEU NEGATIVE 06/04/2022 2204   GLUCOSEU NEGATIVE 10/10/2021 1013   HGBUR NEGATIVE 06/04/2022 2204   BILIRUBINUR NEGATIVE 06/04/2022 2204   KETONESUR NEGATIVE 06/04/2022 2204   PROTEINUR 30 (A) 06/04/2022 2204   UROBILINOGEN 0.2 10/10/2021 1013   NITRITE POSITIVE (A) 06/04/2022 2204   LEUKOCYTESUR MODERATE (A) 06/04/2022 2204    Sepsis Labs: Lactic Acid, Venous    Component Value Date/Time   LATICACIDVEN 1.5 06/05/2022 0038    MICROBIOLOGY: No results found for this or any previous visit (from the past 240 hour(s)).  RADIOLOGY STUDIES/RESULTS: DG Chest Port 1 View  Result Date: 06/29/2022 CLINICAL DATA:   Shortness of breath, history of sarcoid. EXAM: PORTABLE CHEST 1 VIEW COMPARISON:  Chest radiograph 1 day prior. FINDINGS: Mild cardiomegaly is unchanged. The upper mediastinal contours are stable There is no focal consolidation or pulmonary edema. There is no pleural effusion or pneumothorax. Aeration is overall unchanged There is no acute osseous abnormality. Contrast is noted in the left colon. IMPRESSION: Stable chest with no radiographic evidence of acute cardiopulmonary process. Electronically Signed   By: Valetta Mole M.D.   On: 06/29/2022 08:15   DG Chest Port 1 View  Result Date: 06/28/2022 CLINICAL DATA:  Shortness of breath EXAM: PORTABLE CHEST 1 VIEW COMPARISON:  06/04/2022 and the CT of 06/15/2022. FINDINGS: Numerous leads and wires project over the chest. Midline trachea. Mild cardiomegaly. Atherosclerosis in the transverse aorta. No pleural effusion or pneumothorax. Clear lungs. IMPRESSION: No acute cardiopulmonary disease. Aortic Atherosclerosis (ICD10-I70.0). Electronically Signed   By: Abigail Miyamoto M.D.   On: 06/28/2022 09:56     LOS: 25 days   Oren Binet, MD  [facility]    To contact the attending provider between 7A-7P or the covering provider during after hours 7P-7A, please log into the web site [email or web address] and access using universal [place] password for that web site. If you do not have the password, please call the hospital operator.  06/29/2022, 11:17 AM

## 2022-06-29 NOTE — Plan of Care (Signed)

## 2022-06-30 ENCOUNTER — Inpatient Hospital Stay (HOSPITAL_COMMUNITY): Payer: BC Managed Care – PPO

## 2022-06-30 DIAGNOSIS — J189 Pneumonia, unspecified organism: Secondary | ICD-10-CM | POA: Diagnosis not present

## 2022-06-30 DIAGNOSIS — A419 Sepsis, unspecified organism: Secondary | ICD-10-CM | POA: Diagnosis not present

## 2022-06-30 DIAGNOSIS — I480 Paroxysmal atrial fibrillation: Secondary | ICD-10-CM | POA: Diagnosis not present

## 2022-06-30 DIAGNOSIS — G4733 Obstructive sleep apnea (adult) (pediatric): Secondary | ICD-10-CM | POA: Diagnosis not present

## 2022-06-30 LAB — GLUCOSE, CAPILLARY
Glucose-Capillary: 148 mg/dL — ABNORMAL HIGH (ref 70–99)
Glucose-Capillary: 161 mg/dL — ABNORMAL HIGH (ref 70–99)
Glucose-Capillary: 179 mg/dL — ABNORMAL HIGH (ref 70–99)
Glucose-Capillary: 183 mg/dL — ABNORMAL HIGH (ref 70–99)
Glucose-Capillary: 192 mg/dL — ABNORMAL HIGH (ref 70–99)
Glucose-Capillary: 195 mg/dL — ABNORMAL HIGH (ref 70–99)
Glucose-Capillary: 219 mg/dL — ABNORMAL HIGH (ref 70–99)

## 2022-06-30 LAB — CBC
HCT: 30.3 % — ABNORMAL LOW (ref 36.0–46.0)
Hemoglobin: 9.9 g/dL — ABNORMAL LOW (ref 12.0–15.0)
MCH: 30 pg (ref 26.0–34.0)
MCHC: 32.7 g/dL (ref 30.0–36.0)
MCV: 91.8 fL (ref 80.0–100.0)
Platelets: 169 10*3/uL (ref 150–400)
RBC: 3.3 MIL/uL — ABNORMAL LOW (ref 3.87–5.11)
RDW: 16.5 % — ABNORMAL HIGH (ref 11.5–15.5)
WBC: 10.4 10*3/uL (ref 4.0–10.5)
nRBC: 0 % (ref 0.0–0.2)

## 2022-06-30 NOTE — Progress Notes (Signed)
PROGRESS NOTE        PATIENT DETAILS Name: Tara [name] Age: 71 y.o. Sex: female Date of Birth: 04-08-1951 Admit Date: 06/04/2022 Admitting Physician Quintella Baton, MD BHA:LPFX, Hunt Oris, MD  Brief Summary: Patient is a 71 y.o.  female with history of sarcoidosis, tardive dyskinesia, CKD stage IIIa, PAF, HTN, HLD, OSA who presented to the hospital on 11/20 with acute hypoxic respiratory failure due to aspiration PNA in the setting of worsening dysphagia/tardive dyskinesia.  She was also found to have multiple cranial nerve deficits-consistent with neurosarcoidosis and started on high-dose steroids.  Unfortunately-in spite of resumption of her benzodiazepine/high-dose IV steroids-she continued to have severe dysphagia and required PEG tube insertion on 12/8.  See below for further details.  Significant events: 11/20>> admit to [facility]-hypoxia-aspiration pneumonia. 12/04>> started on high-dose Solu-Medrol for cranial nerve deficits/secondary to neurosarcoid. 12/14>> accumulating secretions-transmitted upper airway sounds-leukocytosis-suspected aspiration pneumonia-Unasyn started.  Significant studies: 11/20>> CXR: Left> right PNA 11/20>> CT head: No acute intracranial abnormality 11/21>> echo: EF 55%. 11/29>> MRI brain wo contrast: No acute intracranial abnormality 11/30>> MRI C-spine: No acute abnormality 11/30>> MRI brain w contrast: No pachymeningeal contrast-enhancement 12/01>> CT chest/abdomen/pelvis: No evidence of primary malignancy/metastatic disease 12/08>> EEG: No seizures. 12/14>> CXR: No PNA  Significant microbiology data: 11/20>> blood culture: No growth 11/20>> urine culture: Multiple species 11/20>> COVID/influenza PCR: Negative  Procedures: 11/22>>Cortrak tube placement 12/08>> PEG tube placed by IR  Consults: Neuro ID  Subjective: Overall slowly improving-awake/alert-able to cough-minimal accumulation of secretions today.  Per nursing  staff-no major issues overnight.  Objective: Vitals: Blood pressure 115/66, pulse 77, temperature 97.9 F (36.6 C), temperature source Oral, resp. rate (!) 24, height '5\' 6"'$  (1.676 m), weight 80.4 kg, SpO2 98 %.   Exam: Gen Exam:Alert awake-not in any distress HEENT:atraumatic, normocephalic Chest: Moving air-some transmitted upper airway sounds. CVS:S1S2 regular Abdomen:soft non tender, non distended Extremities:no edema Neurology: Non focal Skin: no rash  Pertinent Labs/Radiology:    Latest Ref Rng & Units 06/30/2022    2:02 AM 06/29/2022    3:00 AM 06/28/2022    9:41 AM  CBC  WBC 4.0 - 10.5 K/uL 10.4  13.7  16.5   Hemoglobin 12.0 - 15.0 g/dL 9.9  10.3  13.2   Hematocrit 36.0 - 46.0 % 30.3  32.4  40.9   Platelets 150 - 400 K/uL 169  170  242     Lab Results  Component Value Date   NA 144 06/29/2022   K 4.0 06/29/2022   CL 109 06/29/2022   CO2 29 06/29/2022      Assessment/Plan: Severe sepsis with acute hypoxic respiratory failure due to aspiration PNA Overall improved-had completed a course of antibiotics but on 12/14 had another aspiration event.  Back on Unasyn-we will plan on 5 days treatment Overall improved-continue attempts to mobilize as much as possible-pulmonary tolerating.  Oropharyngeal dysphagia Felt to be due to flare of tardive dyskinesia and neurosarcoidosis.   No significant improvement after restarting Tranxene/Ingrezza for tardive dyskinesia and IV steroids for neurosarcoidosis.   PEG tube ultimately placed on 12/8-previously had cortrak tube Seems to be tolerating tube feeds well.  Neurosarcoidosis flare With multiple cranial nerve deficits Completed 5 days of Solu-Medrol  Neurology recommending outpatient immunology follow-up.  Tardive dyskinesia flare Followed by movement clinic at [place] to be worse after stopping benzos-now overall improved  with some minimal involuntary movements-continue Tranxene/Ingrezza. Per  patient-has a follow-up with neurology at [facility] on 12/18.  Facial Pain-?Trigeminal Neuralgia Continues to have intermittent facial pain-no improvement with Tegretol-now on Neurontin.   Recommendations from neurology are for outpatient follow-up with primary neurologist with gradual up titration of Neurontin as tolerated.    AKI on CKD stage IIIa AKI hemodynamically mediated Resolved.  Out of care.  Transaminitis Improved Likely related to sepsis Follow LFTs periodically  Minimally elevated troponin Likely demand ischemia Echo with stable EF  Persistent Afib Remains in Afib for past several days Rate controlled Continue Cardizem Continue Eliquis. Discussed with cardiologist-Dr. Percival Spanish 12/09-continue flecainide until follow-up with outpatient primary cardiologist.  HTN BP stable-continue hydralazine/Cardizem/Imdur  OSA Avoid CPAP given significant issues with dysphagia/oral secretions  Left lower lobe groundglass 4 mm nodule Further workup deferred to the outpatient setting.  Palliative care/FTT Although overall improved-continues to have issues with dysphagia Remains at risk for aspiration events Appreciate palliative care input-now a DNR-ongoing goals of care discussion Plans are to discharge temporarily to SNF-family will ultimately take her home with hospice.  Nutrition Status: Nutrition Problem: Inadequate oral intake Etiology: inability to eat Signs/Symptoms: NPO status Interventions: Tube feeding  BMI: Estimated body mass index is 28.61 kg/m as calculated from the following:   Height as of this encounter: '5\' 6"'$  (1.676 m).   Weight as of this encounter: 80.4 kg.   Code status:   Code Status: DNR   DVT Prophylaxis: SCDs Start: 06/04/22 2221 apixaban (ELIQUIS) tablet 5 mg   Family Communication: Sister [name] [phone] -updated over the phone 12/14  Disposition Plan: Status is: Inpatient Remains inpatient appropriate because:  Severity of illness.   Planned Discharge Destination:Skilled nursing facility when bed available.   Diet: Diet Order             Diet NPO time specified  Diet effective midnight                     Antimicrobial agents: Anti-infectives (From admission, onward)    Start     Dose/Rate Route Frequency Ordered Stop   06/28/22 1400  Ampicillin-Sulbactam (UNASYN) 3 g in sodium chloride 0.9 % 100 mL IVPB        3 g 200 mL/hr over 30 Minutes Intravenous Every 6 hours 06/28/22 1303 07/03/22 1359   06/22/22 0600  ceFAZolin (ANCEF) IVPB 2g/100 mL premix        2 g 200 mL/hr over 30 Minutes Intravenous To Radiology 06/20/22 1400 06/22/22 1259   06/06/22 1730  ceFEPIme (MAXIPIME) 2 g in sodium chloride 0.9 % 100 mL IVPB        2 g 200 mL/hr over 30 Minutes Intravenous Every 12 hours 06/06/22 1627 06/09/22 1649   06/05/22 2200  vancomycin (VANCOREADY) IVPB 750 mg/150 mL  Status:  Discontinued        750 mg 150 mL/hr over 60 Minutes Intravenous Every 24 hours 06/04/22 2328 06/05/22 1019   06/04/22 2330  ceFEPIme (MAXIPIME) 2 g in sodium chloride 0.9 % 100 mL IVPB  Status:  Discontinued        2 g 200 mL/hr over 30 Minutes Intravenous Every 24 hours 06/04/22 2324 06/06/22 1627   06/04/22 2330  vancomycin (VANCOREADY) IVPB 1500 mg/300 mL        1,500 mg 150 mL/hr over 120 Minutes Intravenous  Once 06/04/22 2324 06/05/22 0233   06/04/22 2230  cefTRIAXone (ROCEPHIN) 2 g in sodium chloride 0.9 % 100  mL IVPB  Status:  Discontinued        2 g 200 mL/hr over 30 Minutes Intravenous Every 24 hours 06/04/22 2223 06/04/22 2224   06/04/22 2230  azithromycin (ZITHROMAX) 500 mg in sodium chloride 0.9 % 250 mL IVPB  Status:  Discontinued        500 mg 250 mL/hr over 60 Minutes Intravenous Every 24 hours 06/04/22 2223 06/04/22 2224   06/04/22 1730  cefTRIAXone (ROCEPHIN) 2 g in sodium chloride 0.9 % 100 mL IVPB  Status:  Discontinued        2 g 200 mL/hr over 30 Minutes Intravenous Every 24 hours  06/04/22 1725 06/04/22 2321   06/04/22 1730  azithromycin (ZITHROMAX) 500 mg in sodium chloride 0.9 % 250 mL IVPB  Status:  Discontinued        500 mg 250 mL/hr over 60 Minutes Intravenous Every 24 hours 06/04/22 1725 06/04/22 2321        MEDICATIONS: Scheduled Meds:  apixaban  5 mg Per Tube BID   brimonidine  1 drop Both Eyes BID   And   timolol  1 drop Both Eyes BID   cholecalciferol  1,000 Units Per Tube Daily   clorazepate  7.5 mg Per Tube BID   diltiazem  60 mg Per Tube Q8H   feeding supplement (JEVITY 1.5 CAL/FIBER)  237 mL Per Tube 5 X Daily   ferrous sulfate  300 mg Per Tube Q breakfast   flecainide  50 mg Per Tube BID   free water  250 mL Per Tube QID   free water  60 mL Per Tube 5 X Daily   gabapentin  100 mg Per Tube TID   glycopyrrolate  0.1 mg Intravenous TID   hydrALAZINE  100 mg Per Tube TID   insulin aspart  0-9 Units Subcutaneous Q4H   isosorbide dinitrate  10 mg Per Tube TID   latanoprost  1 drop Both Eyes QHS   multivitamin with minerals  1 tablet Per Tube Daily   pantoprazole (PROTONIX) IV  40 mg Intravenous Q24H   polyethylene glycol  17 g Per Tube BID   polyvinyl alcohol  1 drop Both Eyes TID   valbenazine  40 mg Oral Daily   Continuous Infusions:  ampicillin-sulbactam (UNASYN) IV 3 g (06/30/22 0929)     PRN Meds:.acetaminophen **OR** acetaminophen, ALPRAZolam, melatonin, metoprolol tartrate, morphine injection, oxyCODONE, polyvinyl alcohol   I have personally reviewed following labs and imaging studies  LABORATORY DATA: CBC: Recent Labs  Lab 06/28/22 0941 06/29/22 0300 06/30/22 0202  WBC 16.5* 13.7* 10.4  HGB 13.2 10.3* 9.9*  HCT 40.9 32.4* 30.3*  MCV 91.7 93.4 91.8  PLT 242 170 169     Basic Metabolic Panel: Recent Labs  Lab 06/25/22 0321 06/28/22 0941 06/29/22 0300  NA 146* 147* 144  K 4.0 4.2 4.0  CL 110 109 109  CO2 '26 29 29  '$ GLUCOSE 194* 171* 194*  BUN 48* 26* 26*  CREATININE 1.10* 0.95 0.99  CALCIUM 9.3 9.4 8.4*      GFR: Estimated Creatinine Clearance: 57.3 mL/min (by C-G formula based on SCr of 0.99 mg/dL).  Liver Function Tests: No results for input(s): "AST", "ALT", "ALKPHOS", "BILITOT", "PROT", "ALBUMIN" in the last 168 hours. No results for input(s): "LIPASE", "AMYLASE" in the last 168 hours. No results for input(s): "AMMONIA" in the last 168 hours.  Coagulation Profile: No results for input(s): "INR", "PROTIME" in the last 168 hours.   Cardiac Enzymes: No results  for input(s): "CKTOTAL", "CKMB", "CKMBINDEX", "TROPONINI" in the last 168 hours.   BNP (last 3 results) No results for input(s): "PROBNP" in the last 8760 hours.  Lipid Profile: No results for input(s): "CHOL", "HDL", "LDLCALC", "TRIG", "CHOLHDL", "LDLDIRECT" in the last 72 hours.  Thyroid Function Tests: No results for input(s): "TSH", "T4TOTAL", "FREET4", "T3FREE", "THYROIDAB" in the last 72 hours.  Anemia Panel: No results for input(s): "VITAMINB12", "FOLATE", "FERRITIN", "TIBC", "IRON", "RETICCTPCT" in the last 72 hours.  Urine analysis:    Component Value Date/Time   COLORURINE AMBER (A) 06/04/2022 2204   APPEARANCEUR HAZY (A) 06/04/2022 2204   LABSPEC 1.015 06/04/2022 2204   PHURINE 5.0 06/04/2022 2204   GLUCOSEU NEGATIVE 06/04/2022 2204   GLUCOSEU NEGATIVE 10/10/2021 1013   HGBUR NEGATIVE 06/04/2022 2204   BILIRUBINUR NEGATIVE 06/04/2022 2204   KETONESUR NEGATIVE 06/04/2022 2204   PROTEINUR 30 (A) 06/04/2022 2204   UROBILINOGEN 0.2 10/10/2021 1013   NITRITE POSITIVE (A) 06/04/2022 2204   LEUKOCYTESUR MODERATE (A) 06/04/2022 2204    Sepsis Labs: Lactic Acid, Venous    Component Value Date/Time   LATICACIDVEN 1.5 06/05/2022 0038    MICROBIOLOGY: No results found for this or any previous visit (from the past 240 hour(s)).  RADIOLOGY STUDIES/RESULTS: DG Chest Port 1 View  Result Date: 06/29/2022 CLINICAL DATA:  Shortness of breath, history of sarcoid. EXAM: PORTABLE CHEST 1 VIEW COMPARISON:   Chest radiograph 1 day prior. FINDINGS: Mild cardiomegaly is unchanged. The upper mediastinal contours are stable There is no focal consolidation or pulmonary edema. There is no pleural effusion or pneumothorax. Aeration is overall unchanged There is no acute osseous abnormality. Contrast is noted in the left colon. IMPRESSION: Stable chest with no radiographic evidence of acute cardiopulmonary process. Electronically Signed   By: Valetta Mole M.D.   On: 06/29/2022 08:15     LOS: 26 days   Oren Binet, MD  [facility]    To contact the attending provider between 7A-7P or the covering provider during after hours 7P-7A, please log into the web site [email or web address] and access using universal [place] password for that web site. If you do not have the password, please call the hospital operator.  06/30/2022, 10:08 AM

## 2022-06-30 NOTE — Progress Notes (Signed)
   06/30/22 2200  Assess: MEWS Score  Temp (!) 102.5 F (39.2 C)  BP 124/75  MAP (mmHg) 91  Pulse Rate (!) 109  ECG Heart Rate (!) 111  Resp 19  SpO2 98 %  O2 Device Nasal Cannula  Assess: MEWS Score  MEWS Temp 2  MEWS Systolic 0  MEWS Pulse 2  MEWS RR 0  MEWS LOC 0  MEWS Score 4  MEWS Score Color Red  Assess: if the MEWS score is Yellow or Red  Were vital signs taken at a resting state? Yes  Focused Assessment No change from prior assessment  Does the patient meet 2 or more of the SIRS criteria? Yes  Does the patient have a confirmed or suspected source of infection? Yes  Provider and Rapid Response Notified? No  MEWS guidelines implemented *See Row Information* No, other (Comment)  Treat  MEWS Interventions Other (Comment) (Gave a  cold sponge bath.)  Assess: SIRS CRITERIA  SIRS Temperature  1  SIRS Pulse 1  SIRS Respirations  0  SIRS WBC 0  SIRS Score Sum  2

## 2022-06-30 NOTE — Plan of Care (Signed)
  Problem: Education: Goal: Knowledge of General Education information will improve Description: Including pain rating scale, medication(s)/side effects and non-pharmacologic comfort measures Outcome: Progressing   Problem: Clinical Measurements: Goal: Ability to maintain clinical measurements within normal limits will improve Outcome: Progressing Goal: Cardiovascular complication will be avoided Outcome: Progressing   Problem: Activity: Goal: Risk for activity intolerance will decrease Outcome: Progressing   Problem: Coping: Goal: Level of anxiety will decrease Outcome: Progressing   Problem: Elimination: Goal: Will not experience complications related to bowel motility Outcome: Progressing   Problem: Pain Managment: Goal: General experience of comfort will improve Outcome: Progressing   Problem: Safety: Goal: Ability to remain free from injury will improve Outcome: Progressing   Problem: Education: Goal: Ability to describe self-care measures that may prevent or decrease complications (Diabetes Survival Skills Education) will improve Outcome: Progressing

## 2022-06-30 NOTE — Progress Notes (Signed)
   06/30/22 2000  Assess: MEWS Score  Temp (!) 102.2 F (39 C)  BP (!) 149/82  MAP (mmHg) 101  Pulse Rate (!) 111  ECG Heart Rate (!) 111  Resp 20  SpO2 93 %  O2 Device Nasal Cannula  O2 Flow Rate (L/min) 2 L/min  Assess: MEWS Score  MEWS Temp 2  MEWS Systolic 0  MEWS Pulse 2  MEWS RR 0  MEWS LOC 0  MEWS Score 4  MEWS Score Color Red  Assess: if the MEWS score is Yellow or Red  Were vital signs taken at a resting state? Yes  Focused Assessment Change from prior assessment (see assessment flowsheet)  Does the patient meet 2 or more of the SIRS criteria? Yes  Does the patient have a confirmed or suspected source of infection? Yes  Provider and Rapid Response Notified? Yes  MEWS guidelines implemented *See Row Information* No, other (Comment)  Treat  MEWS Interventions Administered prn meds/treatments  Pain Scale 0-10  Pain Score 0  Take Vital Signs  Increase Vital Sign Frequency  Red: Q 1hr X 4 then Q 4hr X 4, if remains red, continue Q 4hrs  Provider Notification  Provider Name/Title Dr.crosley  Date Provider Notified 06/30/22  Time Provider Notified 2021  Method of Notification  (secure chat)  Notification Reason Other (Comment) (fever of 102.2 F)  Provider response See new orders (Chest Xray,Blood culture and Urinalysis)  Date of Provider Response 06/30/22  Time of Provider Response 2100  Assess: SIRS CRITERIA  SIRS Temperature  1  SIRS Pulse 1  SIRS Respirations  0  SIRS WBC 0  SIRS Score Sum  2

## 2022-07-01 ENCOUNTER — Inpatient Hospital Stay (HOSPITAL_COMMUNITY): Payer: BC Managed Care – PPO

## 2022-07-01 DIAGNOSIS — J189 Pneumonia, unspecified organism: Secondary | ICD-10-CM | POA: Diagnosis not present

## 2022-07-01 DIAGNOSIS — A419 Sepsis, unspecified organism: Secondary | ICD-10-CM | POA: Diagnosis not present

## 2022-07-01 LAB — CBC
HCT: 32.1 % — ABNORMAL LOW (ref 36.0–46.0)
Hemoglobin: 10.2 g/dL — ABNORMAL LOW (ref 12.0–15.0)
MCH: 29.6 pg (ref 26.0–34.0)
MCHC: 31.8 g/dL (ref 30.0–36.0)
MCV: 93 fL (ref 80.0–100.0)
Platelets: 184 10*3/uL (ref 150–400)
RBC: 3.45 MIL/uL — ABNORMAL LOW (ref 3.87–5.11)
RDW: 16.6 % — ABNORMAL HIGH (ref 11.5–15.5)
WBC: 10.9 10*3/uL — ABNORMAL HIGH (ref 4.0–10.5)
nRBC: 0 % (ref 0.0–0.2)

## 2022-07-01 LAB — URINALYSIS, ROUTINE W REFLEX MICROSCOPIC
Bacteria, UA: NONE SEEN
Bilirubin Urine: NEGATIVE
Glucose, UA: 50 mg/dL — AB
Hgb urine dipstick: NEGATIVE
Ketones, ur: NEGATIVE mg/dL
Nitrite: NEGATIVE
Protein, ur: NEGATIVE mg/dL
Specific Gravity, Urine: 1.019 (ref 1.005–1.030)
WBC, UA: >50 WBC/hpf — ABNORMAL HIGH (ref 0–5)
pH: 6 (ref 5.0–8.0)

## 2022-07-01 LAB — BASIC METABOLIC PANEL
Anion gap: 7 (ref 5–15)
BUN: 20 mg/dL (ref 8–23)
CO2: 29 mmol/L (ref 22–32)
Calcium: 8.1 mg/dL — ABNORMAL LOW (ref 8.9–10.3)
Chloride: 108 mmol/L (ref 98–111)
Creatinine, Ser: 0.92 mg/dL (ref 0.44–1.00)
GFR, Estimated: >60 mL/min (ref >60)
Glucose, Bld: 160 mg/dL — ABNORMAL HIGH (ref 70–99)
Potassium: 3.9 mmol/L (ref 3.5–5.1)
Sodium: 144 mmol/L (ref 135–145)

## 2022-07-01 LAB — GLUCOSE, CAPILLARY
Glucose-Capillary: 132 mg/dL — ABNORMAL HIGH (ref 70–99)
Glucose-Capillary: 158 mg/dL — ABNORMAL HIGH (ref 70–99)
Glucose-Capillary: 164 mg/dL — ABNORMAL HIGH (ref 70–99)
Glucose-Capillary: 175 mg/dL — ABNORMAL HIGH (ref 70–99)
Glucose-Capillary: 187 mg/dL — ABNORMAL HIGH (ref 70–99)
Glucose-Capillary: 197 mg/dL — ABNORMAL HIGH (ref 70–99)

## 2022-07-01 LAB — PROCALCITONIN: Procalcitonin: 0.17 ng/mL

## 2022-07-01 NOTE — Progress Notes (Signed)
PROGRESS NOTE        PATIENT DETAILS Name: Tara [name] Age: 71 y.o. Sex: female Date of Birth: [birth year] Admit Date: 06/04/2022 Admitting Physician Quintella Baton, MD DEY:CXKG, Hunt Oris, MD  Brief Summary: Patient is a 71 y.o.  female with history of sarcoidosis, tardive dyskinesia, CKD stage IIIa, PAF, HTN, HLD, OSA who presented to the hospital on 11/20 with acute hypoxic respiratory failure due to aspiration PNA in the setting of worsening dysphagia/tardive dyskinesia.  She was also found to have multiple cranial nerve deficits-consistent with neurosarcoidosis and started on high-dose steroids.  Unfortunately-in spite of resumption of her benzodiazepine/high-dose IV steroids-she continued to have severe dysphagia and required PEG tube insertion on 12/8.  See below for further details.  Significant events: 11/20>> admit to [facility]-hypoxia-aspiration pneumonia. 12/04>> started on high-dose Solu-Medrol for cranial nerve deficits/secondary to neurosarcoid. 12/14>> accumulating secretions-transmitted upper airway sounds-leukocytosis-suspected aspiration pneumonia-Unasyn started.  Significant studies: 11/20>> CXR: Left> right PNA 11/20>> CT head: No acute intracranial abnormality 11/21>> echo: EF 55%. 11/29>> MRI brain wo contrast: No acute intracranial abnormality 11/30>> MRI C-spine: No acute abnormality 11/30>> MRI brain w contrast: No pachymeningeal contrast-enhancement 12/01>> CT chest/abdomen/pelvis: No evidence of primary malignancy/metastatic disease 12/08>> EEG: No seizures. 12/14>> CXR: No PNA  Significant microbiology data: 11/20>> blood culture: No growth 11/20>> urine culture: Multiple species 11/20>> COVID/influenza PCR: Negative  Procedures: 11/22>>Cortrak tube placement 12/08>> PEG tube placed by IR  Consults: Neuro ID  Subjective: Overall slowly improving-awake/alert-able to cough-minimal accumulation of secretions today.  Per nursing  staff-no major issues overnight.  Objective: Vitals: Blood pressure 132/68, pulse 80, temperature 98.7 F (37.1 C), temperature source Oral, resp. rate (!) 27, height '5\' 6"'$  (1.676 m), weight 79.6 kg, SpO2 99 %.   Exam:  Awake Alert, No new F.N deficits, PEG, pur wick [place].AT,PERRAL Supple Neck, No JVD,   Symmetrical Chest wall movement, Good air movement bilaterally, CTAB RRR,No Gallops, Rubs or new Murmurs,  +ve B.Sounds, Abd Soft, No tenderness,   No Cyanosis, Clubbing or edema    Assessment/Plan: Severe sepsis with acute hypoxic respiratory failure due to aspiration PNA Overall improved-had completed a course of antibiotics but on 12/14 had another aspiration event.  Back on Unasyn-we will plan on 5 days treatment Overall improved-continue attempts to mobilize as much as possible-pulmonary tolerating.  Oropharyngeal dysphagia Felt to be due to flare of tardive dyskinesia and neurosarcoidosis.   No significant improvement after restarting Tranxene/Ingrezza for tardive dyskinesia and IV steroids for neurosarcoidosis.   PEG tube ultimately placed on 12/8-previously had cortrak tube Seems to be tolerating tube feeds well.  Neurosarcoidosis flare With multiple cranial nerve deficits Completed 5 days of Solu-Medrol  Neurology recommending outpatient immunology follow-up.  Tardive dyskinesia flare Followed by movement clinic at [place] to be worse after stopping benzos-now overall improved with some minimal involuntary movements-continue Tranxene/Ingrezza. Per patient-has a follow-up with neurology at [facility] on 12/18.  Facial Pain-?Trigeminal Neuralgia Continues to have intermittent facial pain-no improvement with Tegretol-now on Neurontin.   Recommendations from neurology are for outpatient follow-up with primary neurologist with gradual up titration of Neurontin as tolerated.    AKI on CKD stage IIIa AKI hemodynamically mediated Resolved.  Out of  care.  Transaminitis Improved Likely related to sepsis Follow LFTs periodically  Minimally elevated troponin Likely demand ischemia Echo with stable EF  Persistent Afib Remains in  Afib for past several days Rate controlled Continue Cardizem Continue Eliquis. Discussed with cardiologist-Dr. Percival Spanish 12/09-continue flecainide until follow-up with outpatient primary cardiologist.  HTN BP stable-continue hydralazine/Cardizem/Imdur  OSA Avoid CPAP given significant issues with dysphagia/oral secretions  Left lower lobe groundglass 4 mm nodule Further workup deferred to the outpatient setting.  Palliative care/FTT Although overall improved-continues to have issues with dysphagia Remains at risk for aspiration events Appreciate palliative care input-now a DNR-ongoing goals of care discussion Plans are to discharge temporarily to SNF-family will ultimately take her home with hospice.  Nutrition Status: Nutrition Problem: Inadequate oral intake Etiology: inability to eat Signs/Symptoms: NPO status Interventions: Tube feeding  BMI: Estimated body mass index is 28.32 kg/m as calculated from the following:   Height as of this encounter: '5\' 6"'$  (1.676 m).   Weight as of this encounter: 79.6 kg.   Code status:   Code Status: DNR   DVT Prophylaxis: SCDs Start: 06/04/22 2221 apixaban (ELIQUIS) tablet 5 mg   Family Communication: Sister [name] [phone] -updated over the phone 12/14  Disposition Plan: Status is: Inpatient Remains inpatient appropriate because: Severity of illness.   Planned Discharge Destination:Skilled nursing facility when bed available.   Diet: Diet Order             Diet NPO time specified  Diet effective midnight                    MEDICATIONS: Scheduled Meds:  apixaban  5 mg Per Tube BID   brimonidine  1 drop Both Eyes BID   And   timolol  1 drop Both Eyes BID   cholecalciferol  1,000 Units Per Tube Daily   clorazepate   7.5 mg Per Tube BID   diltiazem  60 mg Per Tube Q8H   feeding supplement (JEVITY 1.5 CAL/FIBER)  237 mL Per Tube 5 X Daily   ferrous sulfate  300 mg Per Tube Q breakfast   flecainide  50 mg Per Tube BID   free water  250 mL Per Tube QID   free water  60 mL Per Tube 5 X Daily   gabapentin  100 mg Per Tube TID   glycopyrrolate  0.1 mg Intravenous TID   hydrALAZINE  100 mg Per Tube TID   insulin aspart  0-9 Units Subcutaneous Q4H   isosorbide dinitrate  10 mg Per Tube TID   latanoprost  1 drop Both Eyes QHS   multivitamin with minerals  1 tablet Per Tube Daily   pantoprazole (PROTONIX) IV  40 mg Intravenous Q24H   polyethylene glycol  17 g Per Tube BID   polyvinyl alcohol  1 drop Both Eyes TID   valbenazine  40 mg Oral Daily   Continuous Infusions:  ampicillin-sulbactam (UNASYN) IV 3 g (07/01/22 0908)   PRN Meds:.acetaminophen **OR** acetaminophen, ALPRAZolam, melatonin, metoprolol tartrate, morphine injection, oxyCODONE, polyvinyl alcohol   I have personally reviewed following labs and imaging studies  LABORATORY DATA:  Recent Labs  Lab 06/28/22 0941 06/29/22 0300 06/30/22 0202 07/01/22 0158  WBC 16.5* 13.7* 10.4 10.9*  HGB 13.2 10.3* 9.9* 10.2*  HCT 40.9 32.4* 30.3* 32.1*  PLT 242 170 169 184  MCV 91.7 93.4 91.8 93.0  MCH 29.6 29.7 30.0 29.6  MCHC 32.3 31.8 32.7 31.8  RDW 16.8* 16.8* 16.5* 16.6*    Recent Labs  Lab 06/25/22 0321 06/28/22 0941 06/29/22 0300 07/01/22 0154 07/01/22 0158  NA 146* 147* 144  --  144  K 4.0 4.2  4.0  --  3.9  CL 110 109 109  --  108  CO2 '26 29 29  '$ --  29  ANIONGAP '10 9 6  '$ --  7  GLUCOSE 194* 171* 194*  --  160*  BUN 48* 26* 26*  --  20  CREATININE 1.10* 0.95 0.99  --  0.92  PROCALCITON  --  <0.10 0.17 0.17  --   CALCIUM 9.3 9.4 8.4*  --  8.1*     RADIOLOGY STUDIES/RESULTS: DG Chest Port 1 View  Result Date: 07/01/2022 CLINICAL DATA:  71 year old female with history of shortness of breath. EXAM: PORTABLE CHEST 1 VIEW  COMPARISON:  Chest x-ray 06/30/2022. FINDINGS: Lung volumes are low. No consolidative airspace disease. No pleural effusions. No pneumothorax. No evidence of pulmonary edema. Heart size is mildly enlarged. Upper mediastinal contours are within normal limits. Atherosclerotic calcifications in the thoracic aorta. IMPRESSION: 1. Low lung volumes without radiographic evidence of acute cardiopulmonary disease. 2. Aortic atherosclerosis. Electronically Signed   By: [name] M.D.   On: 07/01/2022 06:59   DG CHEST PORT 1 VIEW  Result Date: 06/30/2022 CLINICAL DATA:  Fever. EXAM: PORTABLE CHEST 1 VIEW COMPARISON:  Chest radiograph 06/29/2022 and earlier FINDINGS: Stable mild cardiomegaly. Aortic calcifications. No new focal consolidation, pleural effusion, or pneumothorax. Residual oral contrast is noted in the left upper abdomen, likely within the descending colon. IMPRESSION: No acute cardiopulmonary abnormality. Electronically Signed   By: Ileana Roup M.D.   On: 06/30/2022 21:56     LOS: 27 days   Signature  -    Lala Lund M.D on 07/01/2022 at 10:45 AM   -  To page go to [email or web address]

## 2022-07-01 NOTE — Progress Notes (Signed)
Pharmacy Antibiotic Note  Tara [name] is a 71 y.o. female admitted on 06/04/2022 with aspiration pneumonia.  Pharmacy has been consulted for Unasyn dosing.  Plan: Continue Unasyn 3 grams IV every 6 hours Monitor clinical progress, cultures/sensitivities, renal function, abx plan  Height: '5\' 6"'$  (167.6 cm) Weight: 79.6 kg (175 lb 7.8 oz) IBW/kg (Calculated) : 59.3  Temp (24hrs), Avg:100.1 F (37.8 C), Min:98.4 F (36.9 C), Max:102.5 F (39.2 C)  Recent Labs  Lab 06/25/22 0321 06/28/22 0941 06/29/22 0300 06/30/22 0202 07/01/22 0158  WBC  --  16.5* 13.7* 10.4 10.9*  CREATININE 1.10* 0.95 0.99  --  0.92     Estimated Creatinine Clearance: 61.4 mL/min (by C-G formula based on SCr of 0.92 mg/dL).    Allergies  Allergen Reactions   Sulfa Antibiotics Rash   Fluoxetine Hcl Other (See Comments)    (PROZAC) Suicidal thoughts    Antimicrobials this admission: 12/14 Unasyn >>   Dose adjustments this admission: N/a  Microbiology results: 11/20 Ucx: multiple species 11/20 flu/COVID negative 11/20 Bcx ngF 11/21 MRSA PCR negative 12/16 Bcx ngtd  Titus Dubin, PharmD PGY1 Pharmacy Resident 07/01/2022 9:19 AM

## 2022-07-02 DIAGNOSIS — J189 Pneumonia, unspecified organism: Secondary | ICD-10-CM | POA: Diagnosis not present

## 2022-07-02 DIAGNOSIS — A419 Sepsis, unspecified organism: Secondary | ICD-10-CM | POA: Diagnosis not present

## 2022-07-02 LAB — CBC WITH DIFFERENTIAL/PLATELET
Abs Immature Granulocytes: 0.16 10*3/uL — ABNORMAL HIGH (ref 0.00–0.07)
Basophils Absolute: 0 10*3/uL (ref 0.0–0.1)
Basophils Relative: 1 %
Eosinophils Absolute: 0.2 10*3/uL (ref 0.0–0.5)
Eosinophils Relative: 3 %
HCT: 32.1 % — ABNORMAL LOW (ref 36.0–46.0)
Hemoglobin: 10.5 g/dL — ABNORMAL LOW (ref 12.0–15.0)
Immature Granulocytes: 2 %
Lymphocytes Relative: 8 %
Lymphs Abs: 0.7 10*3/uL (ref 0.7–4.0)
MCH: 29.7 pg (ref 26.0–34.0)
MCHC: 32.7 g/dL (ref 30.0–36.0)
MCV: 90.9 fL (ref 80.0–100.0)
Monocytes Absolute: 0.6 10*3/uL (ref 0.1–1.0)
Monocytes Relative: 7 %
Neutro Abs: 6.8 10*3/uL (ref 1.7–7.7)
Neutrophils Relative %: 79 %
Platelets: 164 10*3/uL (ref 150–400)
RBC: 3.53 MIL/uL — ABNORMAL LOW (ref 3.87–5.11)
RDW: 16.3 % — ABNORMAL HIGH (ref 11.5–15.5)
WBC: 8.5 10*3/uL (ref 4.0–10.5)
nRBC: 0 % (ref 0.0–0.2)

## 2022-07-02 LAB — BASIC METABOLIC PANEL
Anion gap: 8 (ref 5–15)
BUN: 19 mg/dL (ref 8–23)
CO2: 29 mmol/L (ref 22–32)
Calcium: 8.2 mg/dL — ABNORMAL LOW (ref 8.9–10.3)
Chloride: 105 mmol/L (ref 98–111)
Creatinine, Ser: 0.89 mg/dL (ref 0.44–1.00)
GFR, Estimated: >60 mL/min (ref >60)
Glucose, Bld: 144 mg/dL — ABNORMAL HIGH (ref 70–99)
Potassium: 3.5 mmol/L (ref 3.5–5.1)
Sodium: 142 mmol/L (ref 135–145)

## 2022-07-02 LAB — PROCALCITONIN: Procalcitonin: <0.1 ng/mL

## 2022-07-02 LAB — GLUCOSE, CAPILLARY
Glucose-Capillary: 121 mg/dL — ABNORMAL HIGH (ref 70–99)
Glucose-Capillary: 135 mg/dL — ABNORMAL HIGH (ref 70–99)
Glucose-Capillary: 190 mg/dL — ABNORMAL HIGH (ref 70–99)
Glucose-Capillary: 197 mg/dL — ABNORMAL HIGH (ref 70–99)
Glucose-Capillary: 212 mg/dL — ABNORMAL HIGH (ref 70–99)
Glucose-Capillary: 212 mg/dL — ABNORMAL HIGH (ref 70–99)

## 2022-07-02 LAB — BRAIN NATRIURETIC PEPTIDE: B Natriuretic Peptide: 457.3 pg/mL — ABNORMAL HIGH (ref 0.0–100.0)

## 2022-07-02 LAB — C-REACTIVE PROTEIN: CRP: 3.7 mg/dL — ABNORMAL HIGH (ref <1.0)

## 2022-07-02 LAB — MAGNESIUM: Magnesium: 2.1 mg/dL (ref 1.7–2.4)

## 2022-07-02 MED ORDER — OSMOLITE 1.5 CAL PO LIQD
237.0000 mL | Freq: Every day | ORAL | Status: DC
  Administered 2022-07-02 – 2022-07-03 (×6): 237 mL
  Filled 2022-07-02 (×8): qty 237

## 2022-07-02 MED ORDER — POTASSIUM CHLORIDE 10 MEQ/100ML IV SOLN
10.0000 meq | INTRAVENOUS | Status: AC
  Administered 2022-07-02 (×2): 10 meq via INTRAVENOUS
  Filled 2022-07-02 (×2): qty 100

## 2022-07-02 NOTE — TOC Progression Note (Addendum)
Transition of Care [facility]) - Progression Note    Patient Details  Name: Tara [name] MRN: [id number] Date of Birth: [birth year]  Transition of Care [facility]) CM/SW [place], LCSW Phone Number: 07/02/2022, 9:00 AM  Clinical Narrative:    CSW spoke with PT Office and requested someone work with patient today for SNF insurance [employer].   [place] checking to make sure they have her tube feed formula (will need to be in continuous form on DC Summary).   Expected Discharge Plan: [place] Barriers to Discharge: Continued Medical Work up, SNF Pending bed offer, Ship broker  Expected Discharge Plan and Services Expected Discharge Plan: [place] In-house Referral: Clinical Social Work   Post Acute Care Choice: [place] Living arrangements for the past 2 months: [place] [facility] Determinants of Health (SDOH) Interventions    Readmission Risk Interventions    06/14/2022    4:37 PM  Readmission Risk Prevention Plan  Transportation Screening Complete  Medication Review Press photographer) Complete  PCP or Specialist appointment within 3-5 days of discharge Complete  HRI or [place] Complete  SW Recovery Care/Counseling Consult Complete  Palliative Care Screening Not [place] Complete

## 2022-07-02 NOTE — Procedures (Signed)
71 y.o. female was seen at the bedside today for t-fasteners removal. History of respiratory failure, tardive dyskinesia and dysphagia . She had an 18 Fr push in gastrostomy tube placed by IR on 06/22/22 for nutritional access. Independently the external suture of the t- fastener was cut and both t-fasteners were removed successfully without incident. Skin site unremarkable with no erythema or tenderness. Dried blood around the tube was cleaned and clean dressing was applied around the gastrotomy tube exit site and bumper was tightened closer to abdominal wall. No active bleeding/leaking around tube noted.   RN made aware of procedure.     Narda Rutherford, AGNP-BC 07/02/2022, 1:08 PM

## 2022-07-02 NOTE — Progress Notes (Signed)
PROGRESS NOTE        PATIENT DETAILS Name: Tara [name] Age: 71 y.o. Sex: female Date of Birth: [birth year] Admit Date: 06/04/2022 Admitting Physician Quintella Baton, MD YNW:GNFA, Hunt Oris, MD  Brief Summary: Patient is a 71 y.o.  female with history of sarcoidosis, tardive dyskinesia, CKD stage IIIa, PAF, HTN, HLD, OSA who presented to the hospital on 11/20 with acute hypoxic respiratory failure due to aspiration PNA in the setting of worsening dysphagia/tardive dyskinesia.  She was also found to have multiple cranial nerve deficits-consistent with neurosarcoidosis and started on high-dose steroids.  Unfortunately-in spite of resumption of her benzodiazepine/high-dose IV steroids-she continued to have severe dysphagia and required PEG tube insertion on 12/8.  See below for further details.  Significant events: 11/20>> admit to [facility]-hypoxia-aspiration pneumonia. 12/04>> started on high-dose Solu-Medrol for cranial nerve deficits/secondary to neurosarcoid. 12/14>> accumulating secretions-transmitted upper airway sounds-leukocytosis-suspected aspiration pneumonia-Unasyn started.  Significant studies: 11/20>> CXR: Left> right PNA 11/20>> CT head: No acute intracranial abnormality 11/21>> echo: EF 55%. 11/29>> MRI brain wo contrast: No acute intracranial abnormality 11/30>> MRI C-spine: No acute abnormality 11/30>> MRI brain w contrast: No pachymeningeal contrast-enhancement 12/01>> CT chest/abdomen/pelvis: No evidence of primary malignancy/metastatic disease 12/08>> EEG: No seizures. 12/14>> CXR: No PNA  Significant microbiology data: 11/20>> blood culture: No growth 11/20>> urine culture: Multiple species 11/20>> COVID/influenza PCR: Negative  Procedures: 11/22>>Cortrak tube placement 12/08>> PEG tube placed by IR  Consults: Neuro ID  Subjective:  Patient in bed, appears comfortable, denies any headache, does have ongoing chronic facial pain, no fever,  no chest pain or pressure, no shortness of breath , no abdominal pain. No new focal weakness.   Objective: Vitals: Blood pressure 131/74, pulse 80, temperature 98.6 F (37 C), temperature source Axillary, resp. rate (!) 25, height '5\' 6"'$  (1.676 m), weight 79.6 kg, SpO2 99 %.   Exam:  Awake Alert, No new F.N deficits, PEG, pur wick [place].AT,PERRAL Supple Neck, No JVD,   Symmetrical Chest wall movement, Good air movement bilaterally, CTAB RRR,No Gallops, Rubs or new Murmurs,  +ve B.Sounds, Abd Soft, No tenderness,   No Cyanosis, Clubbing or edema    Assessment/Plan: Severe sepsis with acute hypoxic respiratory failure due to aspiration PNA Overall improved-had completed a course of antibiotics but on 12/14 had another aspiration event.  Back on Unasyn-we will plan on 5 days treatment Overall improved-continue attempts to mobilize as much as possible-pulmonary tolerating.  Oropharyngeal dysphagia Felt to be due to flare of tardive dyskinesia and neurosarcoidosis.   No significant improvement after restarting Tranxene/Ingrezza for tardive dyskinesia and IV steroids for neurosarcoidosis.   PEG tube ultimately placed on 12/8-previously had cortrak tube Seems to be tolerating tube feeds well.  Neurosarcoidosis flare With multiple cranial nerve deficits Completed 5 days of Solu-Medrol  Neurology recommending outpatient immunology follow-up.  Tardive dyskinesia flare Followed by movement clinic at [place] to be worse after stopping benzos-now overall improved with some minimal involuntary movements-continue Tranxene/Ingrezza. Per patient-has a follow-up with neurology at [facility] on 12/18.  Facial Pain-?Trigeminal Neuralgia Continues to have intermittent facial pain-no improvement with Tegretol-now on Neurontin.   Recommendations from neurology are for outpatient follow-up with primary neurologist with gradual up titration of Neurontin as tolerated.    AKI on CKD  stage IIIa AKI hemodynamically mediated Resolved.  Out of care.  Transaminitis Improved Likely related to sepsis  Follow LFTs periodically  Minimally elevated troponin Likely demand ischemia Echo with stable EF  Persistent Afib Remains in Afib for past several days Rate controlled Continue Cardizem Continue Eliquis. Discussed with cardiologist-Dr. Percival Spanish 12/09-continue flecainide until follow-up with outpatient primary cardiologist.  HTN BP stable-continue hydralazine/Cardizem/Imdur  OSA Avoid CPAP given significant issues with dysphagia/oral secretions  Left lower lobe groundglass 4 mm nodule Further workup deferred to the outpatient setting.  Palliative care/FTT Although overall improved-continues to have issues with dysphagia Remains at risk for aspiration events Appreciate palliative care input-now a DNR-ongoing goals of care discussion Plans are to discharge temporarily to SNF-family will ultimately take her home with hospice.  Nutrition Status: Nutrition Problem: Inadequate oral intake Etiology: inability to eat Signs/Symptoms: NPO status Interventions: Tube feeding  BMI: Estimated body mass index is 28.32 kg/m as calculated from the following:   Height as of this encounter: '5\' 6"'$  (1.676 m).   Weight as of this encounter: 79.6 kg.   Code status:   Code Status: DNR   DVT Prophylaxis: SCDs Start: 06/04/22 2221 apixaban (ELIQUIS) tablet 5 mg   Family Communication: Sister [name] [phone] -updated over the phone 12/14  Disposition Plan: Status is: Inpatient Remains inpatient appropriate because: Severity of illness.   Planned Discharge Destination:Skilled nursing facility when bed available.   Diet: Diet Order             Diet NPO time specified  Diet effective midnight                    MEDICATIONS: Scheduled Meds:  apixaban  5 mg Per Tube BID   brimonidine  1 drop Both Eyes BID   And   timolol  1 drop Both Eyes BID    cholecalciferol  1,000 Units Per Tube Daily   clorazepate  7.5 mg Per Tube BID   diltiazem  60 mg Per Tube Q8H   feeding supplement (JEVITY 1.5 CAL/FIBER)  237 mL Per Tube 5 X Daily   ferrous sulfate  300 mg Per Tube Q breakfast   flecainide  50 mg Per Tube BID   free water  250 mL Per Tube QID   free water  60 mL Per Tube 5 X Daily   gabapentin  100 mg Per Tube TID   glycopyrrolate  0.1 mg Intravenous TID   hydrALAZINE  100 mg Per Tube TID   insulin aspart  0-9 Units Subcutaneous Q4H   isosorbide dinitrate  10 mg Per Tube TID   latanoprost  1 drop Both Eyes QHS   multivitamin with minerals  1 tablet Per Tube Daily   pantoprazole (PROTONIX) IV  40 mg Intravenous Q24H   polyethylene glycol  17 g Per Tube BID   polyvinyl alcohol  1 drop Both Eyes TID   valbenazine  40 mg Oral Daily   Continuous Infusions:  ampicillin-sulbactam (UNASYN) IV 3 g (07/02/22 0858)   PRN Meds:.acetaminophen **OR** acetaminophen, ALPRAZolam, melatonin, metoprolol tartrate, morphine injection, oxyCODONE, polyvinyl alcohol   I have personally reviewed following labs and imaging studies  LABORATORY DATA:  Recent Labs  Lab 06/28/22 0941 06/29/22 0300 06/30/22 0202 07/01/22 0158 07/02/22 0455  WBC 16.5* 13.7* 10.4 10.9* 8.5  HGB 13.2 10.3* 9.9* 10.2* 10.5*  HCT 40.9 32.4* 30.3* 32.1* 32.1*  PLT 242 170 169 184 164  MCV 91.7 93.4 91.8 93.0 90.9  MCH 29.6 29.7 30.0 29.6 29.7  MCHC 32.3 31.8 32.7 31.8 32.7  RDW 16.8* 16.8* 16.5* 16.6* 16.3*  LYMPHSABS  --   --   --   --  0.7  MONOABS  --   --   --   --  0.6  EOSABS  --   --   --   --  0.2  BASOSABS  --   --   --   --  0.0    Recent Labs  Lab 06/28/22 0941 06/29/22 0300 07/01/22 0154 07/01/22 0158 07/02/22 0455  NA 147* 144  --  144 142  K 4.2 4.0  --  3.9 3.5  CL 109 109  --  108 105  CO2 29 29  --  29 29  ANIONGAP 9 6  --  7 8  GLUCOSE 171* 194*  --  160* 144*  BUN 26* 26*  --  20 19  CREATININE 0.95 0.99  --  0.92 0.89  CRP  --   --    --   --  3.7*  PROCALCITON <0.10 0.17 0.17  --  <0.10  BNP  --   --   --   --  457.3*  MG  --   --   --   --  2.1  CALCIUM 9.4 8.4*  --  8.1* 8.2*     RADIOLOGY STUDIES/RESULTS: DG Chest Port 1 View  Result Date: 07/01/2022 CLINICAL DATA:  71 year old female with history of shortness of breath. EXAM: PORTABLE CHEST 1 VIEW COMPARISON:  Chest x-ray 06/30/2022. FINDINGS: Lung volumes are low. No consolidative airspace disease. No pleural effusions. No pneumothorax. No evidence of pulmonary edema. Heart size is mildly enlarged. Upper mediastinal contours are within normal limits. Atherosclerotic calcifications in the thoracic aorta. IMPRESSION: 1. Low lung volumes without radiographic evidence of acute cardiopulmonary disease. 2. Aortic atherosclerosis. Electronically Signed   By: [name] M.D.   On: 07/01/2022 06:59   DG CHEST PORT 1 VIEW  Result Date: 06/30/2022 CLINICAL DATA:  Fever. EXAM: PORTABLE CHEST 1 VIEW COMPARISON:  Chest radiograph 06/29/2022 and earlier FINDINGS: Stable mild cardiomegaly. Aortic calcifications. No new focal consolidation, pleural effusion, or pneumothorax. Residual oral contrast is noted in the left upper abdomen, likely within the descending colon. IMPRESSION: No acute cardiopulmonary abnormality. Electronically Signed   By: Ileana Roup M.D.   On: 06/30/2022 21:56     LOS: 28 days   Signature  -    Lala Lund M.D on 07/02/2022 at 10:20 AM   -  To page go to [email or web address]

## 2022-07-02 NOTE — Progress Notes (Signed)
Nutrition Follow-up  DOCUMENTATION CODES:   Not applicable  INTERVENTION:   Bolus feeds via PEG:  Switch to 1 carton of Osmolite 1.5 - five times daily Administer slowly, do not push with the plunger 30 mL free water flush before and after each bolus + 150 mL free water - four times per day or per MD Provides 1778 kcal, 75 gm protein, and 1805 mL total free water daily.  NUTRITION DIAGNOSIS:   Inadequate oral intake related to inability to eat as evidenced by NPO status. - Being addressed via TF  GOAL:   Patient will meet greater than or equal to 90% of their needs - Being met via TF  MONITOR:   Diet advancement, Labs, TF tolerance, I & O's, Weight trends  REASON FOR ASSESSMENT:   Consult Enteral/tube feeding initiation and management  ASSESSMENT:   71 y.o. female presented to the ED with decreased consciousness from SNF. PMH includes CKD IV, MDD, and HTN. Pt admitted with sepsis secondary to PNA, AKI on CKD, acute liver failure, and elevated troponin.  11/20 - Admitted  11/22 - Cortrak placed (tip pyloric region) 11/29 - diet advanced to full liquids  12/03 - NPO 12/05 - MBS, recommend NPO 12/08 - Cortrak removed, PEG placed 12/12 - transitioned to bolus 12/18 - switched to Osmolite 1.5   RD received a secure chat from pharmacy that Jevity 1.5 is out of stock. RD to switch pt to Osmolite 1.5 until Jevity is back in stock. RD communicated switch with RN. RN reports that pt has been tolerating well.   Ongoing discharge planning continues.   Medications reviewed and include: Vitamin D3, Ferrous Sulfate, NovoLog SSI, MVI, Protonix, Miralax, IV antibiotics  Labs reviewed: 24 hr CBGs 121-212  Diet Order:   Diet Order             Diet NPO time specified  Diet effective midnight                  EDUCATION NEEDS:   Not appropriate for education at this time  Skin:  Skin Assessment: Reviewed RN Assessment  Last BM:  12/15  Height:  Ht Readings from Last  1 Encounters:  06/04/22 _0  (1.676 m)   Weight:  Wt Readings from Last 1 Encounters:  07/01/22 79.6 kg   Ideal Body Weight:  59.1 kg  BMI:  Body mass index is 28.32 kg/m.  Estimated Nutritional Needs:  Kcal:  1700-1900 Protein:  85-100 grams Fluid:  >/= 1.7 L    Hermina Barters RD, LDN Clinical Dietitian See [facility] for contact information.

## 2022-07-02 NOTE — Progress Notes (Signed)
ANTICOAGULATION CONSULT NOTE  Pharmacy Consult:  Lovenox => Apixaban Indication: atrial fibrillation  Allergies  Allergen Reactions   Sulfa Antibiotics Rash   Fluoxetine Hcl Other (See Comments)    (PROZAC) Suicidal thoughts    Patient Measurements: Height: '5\' 6"'$  (167.6 cm) Weight: 79.6 kg (175 lb 7.8 oz) IBW/kg (Calculated) : 59.3  Vital Signs: Temp: 98.6 F (37 C) (12/18 0800) Temp Source: Axillary (12/18 0800) BP: 131/74 (12/18 0346) Pulse Rate: 80 (12/18 0346)  Labs: Recent Labs    06/30/22 0202 07/01/22 0158 07/02/22 0455  HGB 9.9* 10.2* 10.5*  HCT 30.3* 32.1* 32.1*  PLT 169 184 164  CREATININE  --  0.92 0.89     Estimated Creatinine Clearance: 63.5 mL/min (by C-G formula based on SCr of 0.89 mg/dL).   Assessment: 33 YOF with history of Afib to transition from Lovenox to home Eliquis 5 mg BID.  Renal function and CBC stable; no bleeding reported. Pharmacy will sign off.    Plan:  Continue Eliquis 5 mg po bid Monitor for bleeding Pharmacy signing off.   Nicole Cella, RPh Clinical Pharmacist [phone] Please see AMION for all Pharmacists' Contact Phone Numbers 07/02/2022, 10:50 AM

## 2022-07-02 NOTE — Progress Notes (Signed)
Physical Therapy Treatment Patient Details Name: Tara [name] MRN: [id number] DOB: 1950-08-20 Today's Date: 07/02/2022   History of Present Illness 71 yo female admitted 11/20 with acute hypoxic respiratory failure due to aspiration PNA in the setting of worsening dysphagia/tardive dyskinesia.  She was also found to have multiple cranial nerve deficits-consistent with neurosarcoidosis and started on high-dose steroids.  Unfortunately-in spite of resumption of her benzodiazepine/high-dose IV steroids-she continued to have severe dysphagia and required PEG tube insertion on 12/8   PMH significant for CKD, sarcoidosis, HTN, obesity,OSA on CPAP, atrial fibrillation on Eliquis    PT Comments    Pt with flat affect but able to transition to sitting and perform seated HEP. Pt continues to require +2 assist for standing and transfer attempts with pt unable to fully extend trunk into standing with +1 assist today. SNF remains appropriate. Goals downgraded.    Recommendations for follow up therapy are one component of a multi-disciplinary discharge planning process, led by the attending physician.  Recommendations may be updated based on patient status, additional functional criteria and insurance authorization.  Follow Up Recommendations  Skilled nursing-short term rehab (<3 hours/day) Can patient physically be transported by private vehicle: No   Assistance Recommended at Discharge Frequent or constant Supervision/Assistance  Patient can return home with the following Assist for transportation;A lot of help with bathing/dressing/bathroom;Help with stairs or ramp for entrance;Two people to help with walking and/or transfers   Equipment Recommendations  None recommended by PT    Recommendations for Other Services       Precautions / Restrictions Precautions Precautions: Fall;Other (comment) Precaution Comments: PEG     Mobility  Bed Mobility Overal bed mobility: Needs Assistance Bed  Mobility: Supine to Sit, Rolling, Sit to Supine     Supine to sit: Mod assist, HOB elevated Sit to supine: Mod assist   General bed mobility comments: mod assist to clear legs and trunk from surface with HOB 30 degrees to pivot to right side of bed. Returnt to bed with assist to lift legs to surface. Total assist in trendelenburg to slide toward [facility]    Transfers Overall transfer level: Needs assistance   Transfers: Sit to/from Stand             General transfer comment: pt able to grasp stedy and rise from elevated surface with mod assist but could not clear sacrum into full standing to clear stedy pads x 2 trials. EOB grossly 8 min    Ambulation/Gait               General Gait Details: unable   Stairs             Wheelchair Mobility    Modified Rankin (Stroke Patients Only)       Balance Overall balance assessment: Needs assistance Sitting-balance support: No upper extremity supported, Feet supported Sitting balance-Leahy Scale: Fair Sitting balance - Comments: EOB grossly 8-10 min with guarding for lines and no physical assist                                    Cognition Arousal/Alertness: Awake/alert Behavior During Therapy: Flat affect Overall Cognitive Status: No family/caregiver present to determine baseline cognitive functioning Area of Impairment: Orientation, Attention, Following commands, Safety/judgement                 Orientation Level: Disoriented to, Time Current Attention Level: Sustained Memory: Decreased short-term memory Following  Commands: Follows one step commands consistently, Follows one step commands with increased time     Problem Solving: Slow processing, Requires verbal cues, Requires tactile cues General Comments: increased time to follow commands, unaware of incontinent stool        Exercises General Exercises - Lower Extremity Long Arc Quad: AAROM, AROM, Right, Left, Seated, 15 reps (AAROm on  RLE) Hip Flexion/Marching: AAROM, Both, 15 reps, Seated    General Comments        Pertinent Vitals/Pain      Home Living                          Prior Function            PT Goals (current goals can now be found in the care plan section) Acute Rehab PT Goals Time For Goal Achievement: 07/16/22 Potential to Achieve Goals: Fair Progress towards PT goals: Progressing toward goals    Frequency    Min 2X/week      PT Plan Current plan remains appropriate    Co-evaluation              AM-PAC PT "6 Clicks" Mobility   Outcome Measure  Help needed turning from your back to your side while in a flat bed without using bedrails?: A Lot Help needed moving from lying on your back to sitting on the side of a flat bed without using bedrails?: A Lot Help needed moving to and from a bed to a chair (including a wheelchair)?: Total Help needed standing up from a chair using your arms (e.g., wheelchair or bedside chair)?: Total Help needed to walk in hospital room?: Total Help needed climbing 3-5 steps with a railing? : Total 6 Click Score: 8    End of Session   Activity Tolerance: Patient tolerated treatment well Patient left: in bed;with call bell/phone within reach;with bed alarm set Nurse Communication: Mobility status;Need for lift equipment PT Visit Diagnosis: Other abnormalities of gait and mobility (R26.89);Muscle weakness (generalized) (M62.81)     Time: 5621-3086 PT Time Calculation (min) (ACUTE ONLY): 29 min  Charges:  $Therapeutic Activity: 23-37 mins                     Merryl Hacker, PT Acute Rehabilitation Services Office: [phone]    Enedina Finner [place] 07/02/2022, 1:47 PM

## 2022-07-03 DIAGNOSIS — D631 Anemia in chronic kidney disease: Secondary | ICD-10-CM | POA: Diagnosis not present

## 2022-07-03 DIAGNOSIS — Z515 Encounter for palliative care: Secondary | ICD-10-CM | POA: Diagnosis not present

## 2022-07-03 DIAGNOSIS — M1712 Unilateral primary osteoarthritis, left knee: Secondary | ICD-10-CM | POA: Diagnosis not present

## 2022-07-03 DIAGNOSIS — J189 Pneumonia, unspecified organism: Secondary | ICD-10-CM | POA: Diagnosis not present

## 2022-07-03 DIAGNOSIS — G4733 Obstructive sleep apnea (adult) (pediatric): Secondary | ICD-10-CM | POA: Diagnosis not present

## 2022-07-03 DIAGNOSIS — R41841 Cognitive communication deficit: Secondary | ICD-10-CM | POA: Diagnosis not present

## 2022-07-03 DIAGNOSIS — J690 Pneumonitis due to inhalation of food and vomit: Secondary | ICD-10-CM | POA: Diagnosis not present

## 2022-07-03 DIAGNOSIS — I10 Essential (primary) hypertension: Secondary | ICD-10-CM | POA: Diagnosis not present

## 2022-07-03 DIAGNOSIS — R0689 Other abnormalities of breathing: Secondary | ICD-10-CM | POA: Diagnosis not present

## 2022-07-03 DIAGNOSIS — R1312 Dysphagia, oropharyngeal phase: Secondary | ICD-10-CM | POA: Diagnosis not present

## 2022-07-03 DIAGNOSIS — E559 Vitamin D deficiency, unspecified: Secondary | ICD-10-CM | POA: Diagnosis not present

## 2022-07-03 DIAGNOSIS — Z7401 Bed confinement status: Secondary | ICD-10-CM | POA: Diagnosis not present

## 2022-07-03 DIAGNOSIS — A419 Sepsis, unspecified organism: Secondary | ICD-10-CM | POA: Diagnosis not present

## 2022-07-03 DIAGNOSIS — N1831 Chronic kidney disease, stage 3a: Secondary | ICD-10-CM | POA: Diagnosis not present

## 2022-07-03 DIAGNOSIS — I129 Hypertensive chronic kidney disease with stage 1 through stage 4 chronic kidney disease, or unspecified chronic kidney disease: Secondary | ICD-10-CM | POA: Diagnosis not present

## 2022-07-03 DIAGNOSIS — D869 Sarcoidosis, unspecified: Secondary | ICD-10-CM | POA: Diagnosis not present

## 2022-07-03 DIAGNOSIS — L89152 Pressure ulcer of sacral region, stage 2: Secondary | ICD-10-CM | POA: Diagnosis not present

## 2022-07-03 DIAGNOSIS — E785 Hyperlipidemia, unspecified: Secondary | ICD-10-CM | POA: Diagnosis not present

## 2022-07-03 DIAGNOSIS — R2681 Unsteadiness on feet: Secondary | ICD-10-CM | POA: Diagnosis not present

## 2022-07-03 DIAGNOSIS — D509 Iron deficiency anemia, unspecified: Secondary | ICD-10-CM | POA: Diagnosis not present

## 2022-07-03 DIAGNOSIS — R627 Adult failure to thrive: Secondary | ICD-10-CM | POA: Diagnosis not present

## 2022-07-03 DIAGNOSIS — R0682 Tachypnea, not elsewhere classified: Secondary | ICD-10-CM | POA: Diagnosis not present

## 2022-07-03 DIAGNOSIS — R1314 Dysphagia, pharyngoesophageal phase: Secondary | ICD-10-CM | POA: Diagnosis not present

## 2022-07-03 DIAGNOSIS — M6281 Muscle weakness (generalized): Secondary | ICD-10-CM | POA: Diagnosis not present

## 2022-07-03 DIAGNOSIS — I480 Paroxysmal atrial fibrillation: Secondary | ICD-10-CM | POA: Diagnosis not present

## 2022-07-03 LAB — CBC WITH DIFFERENTIAL/PLATELET
Abs Immature Granulocytes: 0.16 10*3/uL — ABNORMAL HIGH (ref 0.00–0.07)
Basophils Absolute: 0.1 10*3/uL (ref 0.0–0.1)
Basophils Relative: 1 %
Eosinophils Absolute: 0.3 10*3/uL (ref 0.0–0.5)
Eosinophils Relative: 5 %
HCT: 33.2 % — ABNORMAL LOW (ref 36.0–46.0)
Hemoglobin: 10.8 g/dL — ABNORMAL LOW (ref 12.0–15.0)
Immature Granulocytes: 2 %
Lymphocytes Relative: 14 %
Lymphs Abs: 1 10*3/uL (ref 0.7–4.0)
MCH: 29.7 pg (ref 26.0–34.0)
MCHC: 32.5 g/dL (ref 30.0–36.0)
MCV: 91.2 fL (ref 80.0–100.0)
Monocytes Absolute: 0.5 10*3/uL (ref 0.1–1.0)
Monocytes Relative: 7 %
Neutro Abs: 5.1 10*3/uL (ref 1.7–7.7)
Neutrophils Relative %: 71 %
Platelets: 175 10*3/uL (ref 150–400)
RBC: 3.64 MIL/uL — ABNORMAL LOW (ref 3.87–5.11)
RDW: 16.4 % — ABNORMAL HIGH (ref 11.5–15.5)
WBC: 7.2 10*3/uL (ref 4.0–10.5)
nRBC: 0 % (ref 0.0–0.2)

## 2022-07-03 LAB — BASIC METABOLIC PANEL
Anion gap: 8 (ref 5–15)
BUN: 19 mg/dL (ref 8–23)
CO2: 31 mmol/L (ref 22–32)
Calcium: 8.6 mg/dL — ABNORMAL LOW (ref 8.9–10.3)
Chloride: 104 mmol/L (ref 98–111)
Creatinine, Ser: 0.83 mg/dL (ref 0.44–1.00)
GFR, Estimated: >60 mL/min (ref >60)
Glucose, Bld: 116 mg/dL — ABNORMAL HIGH (ref 70–99)
Potassium: 3.9 mmol/L (ref 3.5–5.1)
Sodium: 143 mmol/L (ref 135–145)

## 2022-07-03 LAB — GLUCOSE, CAPILLARY
Glucose-Capillary: 105 mg/dL — ABNORMAL HIGH (ref 70–99)
Glucose-Capillary: 176 mg/dL — ABNORMAL HIGH (ref 70–99)
Glucose-Capillary: 176 mg/dL — ABNORMAL HIGH (ref 70–99)

## 2022-07-03 LAB — BRAIN NATRIURETIC PEPTIDE: B Natriuretic Peptide: 292 pg/mL — ABNORMAL HIGH (ref 0.0–100.0)

## 2022-07-03 LAB — MAGNESIUM: Magnesium: 2.2 mg/dL (ref 1.7–2.4)

## 2022-07-03 LAB — C-REACTIVE PROTEIN: CRP: 2.1 mg/dL — ABNORMAL HIGH (ref <1.0)

## 2022-07-03 LAB — PROCALCITONIN: Procalcitonin: <0.1 ng/mL

## 2022-07-03 MED ORDER — DESVENLAFAXINE SUCCINATE ER 100 MG PO TB24: 100.0000 mg | ORAL_TABLET | Freq: Every day | ORAL | 3 refills | Status: DC

## 2022-07-03 MED ORDER — POLYETHYLENE GLYCOL 3350 17 G PO PACK: 17.0000 g | PACK | Freq: Two times a day (BID) | ORAL | 0 refills | Status: DC

## 2022-07-03 MED ORDER — SENNOSIDES 8.6 MG PO TABS: 1.0000 | ORAL_TABLET | Freq: Every day | ORAL | Status: DC

## 2022-07-03 MED ORDER — OXYCODONE HCL 5 MG PO TABS: 5.0000 mg | ORAL_TABLET | Freq: Four times a day (QID) | ORAL | 0 refills | Status: DC | PRN

## 2022-07-03 MED ORDER — FLECAINIDE ACETATE 50 MG PO TABS: 50.0000 mg | ORAL_TABLET | Freq: Two times a day (BID) | ORAL | Status: DC

## 2022-07-03 MED ORDER — FREE WATER: 250.0000 mL | Freq: Four times a day (QID) | Status: DC

## 2022-07-03 MED ORDER — ISOSORBIDE DINITRATE 10 MG PO TABS: 10.0000 mg | ORAL_TABLET | Freq: Three times a day (TID) | ORAL | Status: DC

## 2022-07-03 MED ORDER — MULTI-VITAMIN/MINERALS PO TABS: 1.0000 | ORAL_TABLET | Freq: Every day | ORAL | Status: DC

## 2022-07-03 MED ORDER — PANTOPRAZOLE SODIUM 40 MG PO PACK: 40.0000 mg | PACK | Freq: Every day | ORAL | Status: DC

## 2022-07-03 MED ORDER — APIXABAN 5 MG PO TABS: 5.0000 mg | ORAL_TABLET | Freq: Two times a day (BID) | ORAL | 1 refills | Status: DC

## 2022-07-03 MED ORDER — OSMOLITE 1.5 CAL PO LIQD: 60.0000 mL | ORAL | Status: DC

## 2022-07-03 MED ORDER — FUROSEMIDE 10 MG/ML IJ SOLN
20.0000 mg | Freq: Once | INTRAMUSCULAR | Status: AC
  Administered 2022-07-03: 20 mg via INTRAVENOUS
  Filled 2022-07-03: qty 2

## 2022-07-03 MED ORDER — VITAMIN D3 25 MCG PO TABS: 1000.0000 [IU] | ORAL_TABLET | Freq: Every day | ORAL | Status: DC

## 2022-07-03 MED ORDER — FERROUS SULFATE 300 (60 FE) MG/5ML PO SOLN: 300.0000 mg | Freq: Every day | ORAL | 3 refills | Status: DC

## 2022-07-03 MED ORDER — DILTIAZEM HCL 60 MG PO TABS: 60.0000 mg | ORAL_TABLET | Freq: Three times a day (TID) | ORAL | Status: DC

## 2022-07-03 MED ORDER — GABAPENTIN 250 MG/5ML PO SOLN: 100.0000 mg | Freq: Three times a day (TID) | ORAL | 12 refills | Status: DC

## 2022-07-03 MED ORDER — AMOXICILLIN-POT CLAVULANATE 250-62.5 MG/5ML PO SUSR: 500.0000 mg | Freq: Two times a day (BID) | ORAL | 0 refills | Status: AC

## 2022-07-03 MED ORDER — MELATONIN 5 MG PO TABS: 10.0000 mg | ORAL_TABLET | Freq: Every day | ORAL | 0 refills | Status: DC

## 2022-07-03 MED ORDER — HYDRALAZINE HCL 100 MG PO TABS: 100.0000 mg | ORAL_TABLET | Freq: Three times a day (TID) | ORAL | Status: DC

## 2022-07-03 MED ORDER — CLORAZEPATE DIPOTASSIUM 7.5 MG PO TABS: 7.5000 mg | ORAL_TABLET | Freq: Two times a day (BID) | ORAL | 0 refills | Status: DC

## 2022-07-03 MED ORDER — OMEGA 3 1200 MG PO CAPS: 2400.0000 mg | ORAL_CAPSULE | Freq: Every day | ORAL | Status: DC

## 2022-07-03 NOTE — TOC Progression Note (Addendum)
Transition of Care [facility]) - Progression Note    Patient Details  Name: [name] MRN: [id number] Date of Birth: 07/27/1950  Transition of Care [facility]) CM/SW [place], LCSW Phone Number: 07/03/2022, 9:12 AM  Clinical Narrative:    [facility] able to accept patient this afternoon. Insurance authorization approved. Tube feeds ready for patient. Paperwork signed. Left voicemail for Anderson Malta, advocate.    Expected Discharge Plan: [place] Barriers to Discharge: Continued Medical Work up, SNF Pending bed offer, Ship broker  Expected Discharge Plan and Services Expected Discharge Plan: [place] In-house Referral: Clinical Social Work   Post Acute Care Choice: [place] Living arrangements for the past 2 months: [place] Determinants of Health (SDOH) Interventions    Readmission Risk Interventions    06/14/2022    4:37 PM  Readmission Risk Prevention Plan  Transportation Screening Complete  Medication Review Press photographer) Complete  PCP or Specialist appointment within 3-5 days of discharge Complete  HRI or [place] Complete  SW Recovery Care/Counseling Consult Complete  Palliative Care Screening Not [place] Complete

## 2022-07-03 NOTE — Progress Notes (Signed)
Patient ID: Tara [name], female   DOB: Sep 12, 1950, 71 y.o.   MRN: [id number]    Progress Note from the Palliative Medicine Team at [facility]   Patient Name: Tara [name]        Date: 07/03/2022 DOB: [birth year]  Age: 71 y.o. MRN#: [id number] Attending Physician: Thurnell Lose, MD Primary Care Physician: Biagio Borg, MD Admit Date: 06/04/2022   Medical records reviewed, assessed the patient, discussed with Anderson Malta Harris/health advocate   71 y.o. female   admitted on 06/04/2022 with  history of sarcoidosis, tardive dyskinesia, CKD stage IIIa, PAF, HTN, HLD, OSA who presented to the hospital on 11/20 with acute hypoxic respiratory failure due to aspiration PNA in the setting of worsening dysphagia/tardive dyskinesia.    She was also found to have multiple cranial nerve deficits-consistent with neurosarcoidosis and started on high-dose steroids.  Unfortunately-in spite of resumption of her benzodiazepine/high-dose IV steroids-she continued to have severe dysphagia and required PEG tube insertion on 12/8.    Plan is for transfer to SNF for rehabilitation, likely today .     Patient  and family will face treatment option decisions, advanced directive decisions and anticipatory care needs into the future 2/2 to multiply co-morbidities.    This NP assessed patient at the bedside as a follow up for palliative medicine  needs and emotional support. .   Patient is with significant overall failure to thrive. Today she remains weak and lethargic.  Speak is soft and communication is difficult.  She is high risk for decompensation  I spoke to [employer] her health advocate.  Education offered on the difference between full medical support path and a palliative comfort path.  Education offered on hospice benefit; philosophy and eligibility  Plan of Care:  - DNR/DNI - continue to treat the treatable and hope for improvement- transition to SNF for rehab -future decisions depend on  outcomes  -Recommend outpatient palliative services at facility   Education offered today regarding  the importance of continued conversation with patient,  decision makers and the  medical providers regarding overall plan of care and treatment options,  ensuring decisions are within the context of the patients values and GOCs.  Questions and concerns addressed   Discussed with treatment team   50 minutes   Wadie Lessen NP  Palliative Medicine Team Team Phone # [phone] Pager [phone]

## 2022-07-03 NOTE — Discharge Instructions (Signed)
Follow with Primary MD Biagio Borg, MD in 7 days   Get CBC, CMP, Magnesium, Phosphorous, Chest X ray -  checked next visit with your SNF MD   Activity: As tolerated with Full fall precautions use walker/cane & assistance as needed  Disposition SNF  Diet: NPO by mouth, all feedings and medications via PEG tube.  Special Instructions: If you have smoked or chewed Tobacco  in the last 2 yrs please stop smoking, stop any regular Alcohol  and or any Recreational drug use.  On your next visit with your primary care physician please Get Medicines reviewed and adjusted.  Please request your Prim.MD to go over all Hospital Tests and Procedure/Radiological results at the follow up, please get all Hospital records sent to your Prim MD by signing hospital release before you go home.  If you experience worsening of your admission symptoms, develop shortness of breath, life threatening emergency, suicidal or homicidal thoughts you must seek medical attention immediately by calling 911 or calling your MD immediately  if symptoms less severe.  You Must read complete instructions/literature along with all the possible adverse reactions/side effects for all the Medicines you take and that have been prescribed to you. Take any new Medicines after you have completely understood and accpet all the possible adverse reactions/side effects.

## 2022-07-03 NOTE — Discharge Summary (Signed)
Tara [name] YBF:[id number] DOB: [birth year] DOA: 06/04/2022  PCP: Biagio Borg, MD  Admit date: 06/04/2022  Discharge date: 07/03/2022  Admitted From: SNF   Disposition:  SNF   Recommendations for Outpatient Follow-up:   Follow up with PCP in 1-2 weeks  PCP Please obtain BMP/CBC, 2 view CXR in 1week,  (see Discharge instructions)   PCP Please follow up on the following pending results:    Home Health: None   Equipment/Devices: None  Consultations: Neuro, ID Discharge Condition: Stable    CODE STATUS: DNR   Diet Recommendation: Tube Feeds, NPO by mouth    Chief Complaint  Patient presents with   Altered Mental Status     Brief history of present illness from the day of admission and additional interim summary    71 y.o.  female with history of sarcoidosis, tardive dyskinesia, CKD stage IIIa, PAF, HTN, HLD, OSA who presented to the hospital on 11/20 with acute hypoxic respiratory failure due to aspiration PNA in the setting of worsening dysphagia/tardive dyskinesia.  She was also found to have multiple cranial nerve deficits-consistent with neurosarcoidosis and started on high-dose steroids.  Unfortunately-in spite of resumption of her benzodiazepine/high-dose IV steroids-she continued to have severe dysphagia and required PEG tube insertion on 12/8.  See below for further details.   Significant events: 11/20>> admit to [facility]-hypoxia-aspiration pneumonia. 12/04>> started on high-dose Solu-Medrol for cranial nerve deficits/secondary to neurosarcoid. 12/14>> accumulating secretions-transmitted upper airway sounds-leukocytosis-suspected aspiration pneumonia-Unasyn started.   Significant studies: 11/20>> CXR: Left> right PNA 11/20>> CT head: No acute intracranial abnormality 11/21>> echo: EF 55%. 11/29>> MRI  brain wo contrast: No acute intracranial abnormality 11/30>> MRI C-spine: No acute abnormality 11/30>> MRI brain w contrast: No pachymeningeal contrast-enhancement 12/01>> CT chest/abdomen/pelvis: No evidence of primary malignancy/metastatic disease 12/08>> EEG: No seizures. 12/14>> CXR: No PNA   Significant microbiology data: 11/20>> blood culture: No growth 11/20>> urine culture: Multiple species 11/20>> COVID/influenza PCR: Negative   Procedures: 11/22>>Cortrak tube placement 12/08>> PEG tube placed by IR   Consults: Neuro ID                                                                 Hospital Course   Severe sepsis with acute hypoxic respiratory failure due to aspiration PNA Overall improved-had completed a course of antibiotics but on 12/14 had another aspiration event.  Back on Unasyn >> Augnmentin stop date 07/05/22 Overall improved-continue attempts to mobilize as much as possible-pulmonary tolerating.   Oropharyngeal dysphagia Felt to be due to flare of tardive dyskinesia and neurosarcoidosis.   No significant improvement after restarting Tranxene/Ingrezza for tardive dyskinesia and IV steroids for neurosarcoidosis.   PEG tube ultimately placed on 12/8-previously had cortrak tube Seems to be tolerating tube feeds well.  Kindly obtain nutritionist consult at SNF and adjust  tube feeds, continue working with speech therapist.   Neurosarcoidosis flare With multiple cranial nerve deficits Completed 5 days of Solu-Medrol  Neurology recommending outpatient immunology follow-up.   Tardive dyskinesia flare Followed by movement clinic at [place] to be worse after stopping benzos-now overall improved with some minimal involuntary movements-continue Tranxene/Ingrezza. Per patient-has a follow-up with neurology at [facility] on 12/18.   Facial Pain-?Trigeminal Neuralgia Continues to have intermittent facial pain-no improvement with Tegretol-now on  Neurontin.   Recommendations from neurology are for outpatient follow-up with primary neurologist with gradual up titration of Neurontin as tolerated.     AKI on CKD stage IIIa AKI hemodynamically mediated Resolved, after some hydration.   Transaminitis Improved Likely related to sepsis Follow LFTs periodically at SNF   Minimally elevated troponin Likely demand ischemia Echo with stable EF   Persistent Afib Remains in Afib for past several days Rate controlled Continue Cardizem Continue Eliquis. Discussed with cardiologist-Dr. Percival Spanish 12/09-continue flecainide until follow-up with outpatient primary cardiologist.   HTN BP stable-continue hydralazine/Cardizem/Imdur   OSA Avoid CPAP given significant issues with dysphagia/oral secretions   Left lower lobe groundglass 4 mm nodule Further workup deferred to the outpatient setting.   Palliative care/FTT Although overall improved-continues to have issues with dysphagia Remains at risk for aspiration events Appreciate palliative care input-now a DNR-ongoing goals of care discussion Plans are to discharge temporarily to SNF-family will ultimately take her home with hospice.      Discharge diagnosis     Principal Problem:   Sepsis due to pneumonia [facility]) Active Problems:   Sarcoidosis   Hyperlipidemia   Essential hypertension   Elevated troponin   Obstructive sleep apnea   Lactic acidosis   Acute kidney injury superimposed on chronic kidney disease (HCC)   Acute liver failure   Hypernatremia   Dehydration   Anxiety and depression   Paroxysmal atrial fibrillation with [facility])    Discharge instructions    Discharge Instructions     Discharge instructions   Complete by: As directed    Follow with Primary MD Biagio Borg, MD in 7 days   Get CBC, CMP, Magnesium, Phosphorous, Chest X ray -  checked next visit with your SNF MD   Activity: As tolerated with Full fall precautions use walker/cane & assistance as  needed  Disposition SNF  Diet: NPO by mouth, all feedings and medications via PEG tube.  Special Instructions: If you have smoked or chewed Tobacco  in the last 2 yrs please stop smoking, stop any regular Alcohol  and or any Recreational drug use.  On your next visit with your primary care physician please Get Medicines reviewed and adjusted.  Please request your Prim.MD to go over all Hospital Tests and Procedure/Radiological results at the follow up, please get all Hospital records sent to your Prim MD by signing hospital release before you go home.  If you experience worsening of your admission symptoms, develop shortness of breath, life threatening emergency, suicidal or homicidal thoughts you must seek medical attention immediately by calling 911 or calling your MD immediately  if symptoms less severe.  You Must read complete instructions/literature along with all the possible adverse reactions/side effects for all the Medicines you take and that have been prescribed to you. Take any new Medicines after you have completely understood and accpet all the possible adverse reactions/side effects.   Increase activity slowly   Complete by: As directed    No wound care   Complete by: As  directed        Discharge Medications   Allergies as of 07/03/2022       Reactions   Sulfa Antibiotics Rash   Fluoxetine Hcl Other (See Comments)   (PROZAC) Suicidal thoughts        Medication List     STOP taking these medications    baclofen 10 MG tablet Commonly known as: LIORESAL   diltiazem 240 MG 24 hr capsule Commonly known as: CARDIZEM CD   ferrous sulfate 325 (65 FE) MG tablet Replaced by: ferrous sulfate 300 (60 Fe) MG/5ML syrup   isosorbide mononitrate 30 MG 24 hr tablet Commonly known as: IMDUR       TAKE these medications    amoxicillin 500 MG tablet Commonly known as: AMOXIL Take 2,000 mg by mouth once. Prior to dental appointments   amoxicillin-clavulanate  250-62.5 MG/5ML suspension Commonly known as: Augmentin Place 10 mLs (500 mg total) into feeding tube 2 (two) times daily for 2 days.   antiseptic oral rinse Liqd 15 mLs by Mouth Rinse route 3 (three) times daily as needed for dry mouth or mouth pain.   apixaban 5 MG Tabs tablet Commonly known as: Eliquis Place 1 tablet (5 mg total) into feeding tube 2 (two) times daily. What changed: See the new instructions.   carboxymethylcellulose 0.5 % Soln Commonly known as: REFRESH PLUS Place 1 drop into both eyes 3 (three) times daily as needed (Dry eyes). RETAIN eye drops instead of refresh   clorazepate 7.5 MG tablet Commonly known as: TRANXENE Place 1 tablet (7.5 mg total) into feeding tube 2 (two) times daily. What changed: how to take this   Combigan 0.2-0.5 % ophthalmic solution Generic drug: brimonidine-timolol Place 1 drop into both eyes every 12 (twelve) hours.   desvenlafaxine 100 MG 24 hr tablet Commonly known as: PRISTIQ Place 1 tablet (100 mg total) into feeding tube at bedtime. What changed: how to take this   diltiazem 60 MG tablet Commonly known as: CARDIZEM Place 1 tablet (60 mg total) into feeding tube every 8 (eight) hours.   feeding supplement (OSMOLITE 1.5 CAL) Liqd 60 mLs by Per J Tube route continuous.   ferrous sulfate 300 (60 Fe) MG/5ML syrup Place 5 mLs (300 mg total) into feeding tube daily with breakfast. Start taking on: July 04, 2022 Replaces: ferrous sulfate 325 (65 FE) MG tablet   flecainide 50 MG tablet Commonly known as: TAMBOCOR Place 1 tablet (50 mg total) into feeding tube 2 (two) times daily. What changed: how to take this   free water Soln Place 250 mLs into feeding tube every 6 (six) hours.   gabapentin 250 MG/5ML solution Commonly known as: NEURONTIN Place 2 mLs (100 mg total) into feeding tube 3 (three) times daily.   hydrALAZINE 100 MG tablet Commonly known as: APRESOLINE Place 1 tablet (100 mg total) into feeding tube 3  (three) times daily. What changed: how to take this   Ingrezza 40 MG capsule Generic drug: valbenazine Take 40 mg by mouth daily.   isosorbide dinitrate 10 MG tablet Commonly known as: ISORDIL Place 1 tablet (10 mg total) into feeding tube 3 (three) times daily.   latanoprost 0.005 % ophthalmic solution Commonly known as: XALATAN Place 1 drop into both eyes at bedtime.   melatonin 5 MG Tabs Place 2 tablets (10 mg total) into feeding tube at bedtime. What changed: how to take this   multivitamin with minerals tablet Place 1 tablet into feeding tube daily. What changed: how  to take this   Omega 3 1200 MG Caps Place 2 capsules (2,400 mg total) into feeding tube at bedtime. What changed: how to take this   oxyCODONE 5 MG immediate release tablet Commonly known as: Oxy IR/ROXICODONE Place 1 tablet (5 mg total) into feeding tube every 6 (six) hours as needed for moderate pain.   pantoprazole sodium 40 mg Commonly known as: PROTONIX Place 40 mg into feeding tube daily.   polyethylene glycol 17 g packet Commonly known as: MIRALAX / GLYCOLAX Place 17 g into feeding tube 2 (two) times daily.   senna 8.6 MG tablet Commonly known as: SENOKOT Place 1 tablet (8.6 mg total) into feeding tube daily. What changed: how to take this   vitamin D3 25 MCG tablet Commonly known as: CHOLECALCIFEROL Place 1 tablet (1,000 Units total) into feeding tube daily. Start taking on: July 04, 2022 What changed:  medication strength how to take this         Contact information for after-discharge care     Destination     [facility] Preferred SNF .   Service: Skilled Nursing Contact information: [address] [place] [phone]                     Major procedures and Radiology Reports - PLEASE review detailed and final reports thoroughly  -       DG Chest Port 1 View  Result Date: 07/01/2022 CLINICAL DATA:   71 year old female with history of shortness of breath. EXAM: PORTABLE CHEST 1 VIEW COMPARISON:  Chest x-ray 06/30/2022. FINDINGS: Lung volumes are low. No consolidative airspace disease. No pleural effusions. No pneumothorax. No evidence of pulmonary edema. Heart size is mildly enlarged. Upper mediastinal contours are within normal limits. Atherosclerotic calcifications in the thoracic aorta. IMPRESSION: 1. Low lung volumes without radiographic evidence of acute cardiopulmonary disease. 2. Aortic atherosclerosis. Electronically Signed   By: [name] M.D.   On: 07/01/2022 06:59   DG CHEST PORT 1 VIEW  Result Date: 06/30/2022 CLINICAL DATA:  Fever. EXAM: PORTABLE CHEST 1 VIEW COMPARISON:  Chest radiograph 06/29/2022 and earlier FINDINGS: Stable mild cardiomegaly. Aortic calcifications. No new focal consolidation, pleural effusion, or pneumothorax. Residual oral contrast is noted in the left upper abdomen, likely within the descending colon. IMPRESSION: No acute cardiopulmonary abnormality. Electronically Signed   By: Ileana Roup M.D.   On: 06/30/2022 21:56   DG Chest Port 1 View  Result Date: 06/29/2022 CLINICAL DATA:  Shortness of breath, history of sarcoid. EXAM: PORTABLE CHEST 1 VIEW COMPARISON:  Chest radiograph 1 day prior. FINDINGS: Mild cardiomegaly is unchanged. The upper mediastinal contours are stable There is no focal consolidation or pulmonary edema. There is no pleural effusion or pneumothorax. Aeration is overall unchanged There is no acute osseous abnormality. Contrast is noted in the left colon. IMPRESSION: Stable chest with no radiographic evidence of acute cardiopulmonary process. Electronically Signed   By: Valetta Mole M.D.   On: 06/29/2022 08:15   DG Chest Port 1 View  Result Date: 06/28/2022 CLINICAL DATA:  Shortness of breath EXAM: PORTABLE CHEST 1 VIEW COMPARISON:  06/04/2022 and the CT of 06/15/2022. FINDINGS: Numerous leads and wires project over the chest. Midline  trachea. Mild cardiomegaly. Atherosclerosis in the transverse aorta. No pleural effusion or pneumothorax. Clear lungs. IMPRESSION: No acute cardiopulmonary disease. Aortic Atherosclerosis (ICD10-I70.0). Electronically Signed   By: Abigail Miyamoto M.D.   On: 06/28/2022 09:56   IR GASTROSTOMY  TUBE MOD SED  Result Date: 06/22/2022 INDICATION: Dysphagia.  CVA. EXAM: PERCUTANEOUS GASTROSTOMY TUBE PLACEMENT COMPARISON:  CT CAP, 06/15/2022. MEDICATIONS: Ancef 2 gm IV; Antibiotics were administered within 1 hour of the procedure. CONTRAST:  15 mL of Isovue 300 administered into the gastric lumen. ANESTHESIA/SEDATION: Moderate (conscious) sedation was employed during this procedure. A total of Versed 1 mg and Fentanyl 50 mcg was administered intravenously. Moderate Sedation Time: 13 minutes. The patient's level of consciousness and vital signs were monitored continuously by radiology nursing throughout the procedure under my direct supervision. FLUOROSCOPY TIME:  Fluoroscopic dose; 2 mGy COMPLICATIONS: None immediate. PROCEDURE: Informed written consent was obtained from the patient and/or patient's representative following explanation of the procedure, risks, benefits and alternatives. A time out was performed prior to the initiation of the procedure. Maximal barrier sterile technique utilized including caps, mask, sterile gowns, sterile gloves, large sterile drape, hand hygiene and Betadine prep. The left upper quadrant was sterilely prepped and draped. A oral gastric catheter was inserted into the stomach under fluoroscopy. The existing nasogastric feeding tube was removed. The left costal margin and barium opacified transverse colon were identified and avoided. Air was injected into the stomach for insufflation and visualization under fluoroscopy. Under sterile conditions and local anesthesia, 2 T tacks were utilized to pexy the anterior aspect of the stomach against the ventral abdominal wall. Contrast injection  confirmed appropriate positioning of each of the T tacks. An incision was made between the T tacks and a 17 gauge trocar needle was utilized to access the stomach. Needle position was confirmed within the stomach with aspiration of air and injection of a small amount of contrast. A stiff Glidewire was advanced into the gastric lumen and under intermittent fluoroscopic guidance, the access needle was exchanged for a Kumpe catheter. With the use of the Kumpe catheter, a stiff Glidewire was advanced into the horizontal segment of the duodenum. Under intermittent fluoroscopic guidance, the Kumpe catheter was exchanged for a telescoping peel-away sheath, ultimately allowing placement of a 18-French balloon retention gastrostomy tube. The retention balloon was insufflated with a mixture of dilute saline and contrast and pulled taut against the anterior wall of the stomach. The external disc was cinched. Contrast injection confirms positioning within the stomach. Several spot radiographic images were obtained in various obliquities for documentation. T he patient tolerated procedure well without immediate post procedural complication. FINDINGS: After successful fluoroscopic guided placement, the gastrostomy tube is appropriately positioned with internal retention balloon against the ventral aspect of the gastric lumen. IMPRESSION: Successful insertion of an 18 Fr balloon retention gastrostomy tube. The gastrostomy may be used immediately for medication administration and in 24 hrs for the initiation of feeds. PLAN: Patient will return to [facility] ([facility]) for routine gastrostomy tube evaluation and exchange in 6 months. Michaelle Birks, MD [facility] Radiology Electronically Signed   By: Michaelle Birks M.D.   On: 06/22/2022 16:20   EEG adult  Result Date: 06/22/2022 Landry Corporal, MD     06/22/2022  7:54 AM [facility]  [facility] Consult Services Routine EEG Report Video Performed: Performed Demographics: Patient Name:   Tara [name], Tara [name] Date of Birth:   June 09, 1951 Identification Number:   MRN - [id number] Study Times: Study Start Time:   06/21/2022 23:13:00 Study End Time:   06/21/2022 23:39:27 Duration:   26 minutes Indication(s): Encephalopathy Technical Summary: This EEG was performed utilizing standard International 10-20 System of electrode placement. Data were obtained and interpreted utilizing referential  montage recording, with reformatting to longitudinal, transverse bipolar, and referential montages as necessary for interpretation. State(s):      Drowsy Activation Procedures: Hyperventilation: Not performed Photic Stimulation: Not performed EEG Description: No normal patterns of awake or sleep were present during this recording. Intermittent generalized theta-delta slowing was present. Excess continuous generalized theta-delta slowing was present. Minor muscle, motion, electrode, and eye movement artifacts were occasionally noted. Impression: Abnormal EEG due to:   1) No normal patterns of awake or sleep  2) Intermittent generalized theta-delta slowing  3) Continuous generalized theta-delta slowing  Clinical Correlation: This EEG is suggestive of a moderate encephalopathy, but is nonspecific as to etiology. The absence of epileptiform abnormalities does not preclude a clinical diagnosis of seizures. [facility] For Inpatient follow-up with [facility] physician please call [facility] [phone]. This is not an outpatient service. Post hospital discharge, please contact hospital directly.   DG Swallowing Func-Speech Pathology  Result Date: 06/19/2022 Table formatting from the original result was not included. Objective Swallowing Evaluation: Type of Study: MBS-Modified Barium Swallow Study  Patient Details Name: Roderick Sweezy MRN: [id number] Date of Birth: July 03, 1951 Today's Date: 06/19/2022 Time: SLP Start Time  (ACUTE ONLY): 5176 -SLP Stop Time (ACUTE ONLY): 1607 SLP Time Calculation (min) (ACUTE ONLY): 20 min Past Medical History: Past Medical History: Diagnosis Date  Anemia   on meds  ANKLE PAIN, LEFT 04/01/2008  ANXIETY 04/17/2007  on meds  Arthritis   generalized  Cataract   bilateral sx  Chronic kidney disease   stage 3  Colon polyp   COLONIC POLYPS, HX OF 08/01/2007  CONTUSIONS, MULTIPLE 04/01/2009  DEPRESSION 04/17/2007  on meds  DIZZINESS 08/01/2007  DYSPNEA 08/01/2007  with exertion  Enlargement of lymph nodes 08/13/2007  Excessive involuntary blinking   per pt,going on since 2018  Glaucoma   on meds  GLUCOSE INTOLERANCE 08/01/2007  Hypercalcemia due to sarcoidosis 2014  HYPERLIPIDEMIA 08/01/2007  no meds  HYPERSOMNIA 07/28/2008  HYPERTENSION 04/17/2007  Impaired glucose tolerance 03/23/2011  JOINT EFFUSION, LEFT KNEE 06/02/2010  Loose body in knee 04/01/2009  pt not sure?  Metabolic encephalopathy 37/04/[id number]/5462  Migraines   "stopped 3-4 yr ago" (07/23/2012)  Morbid obesity ([place]) 04/20/2007  OTHER DISEASES OF LUNG NOT ELSEWHERE CLASSIFIED 08/01/2007  Pain in joint, lower leg 04/01/2009  PERIPHERAL EDEMA 04/21/2009  Pre-diabetes   Sarcoidosis 09/25/2007  Seasonal allergies   SHOULDER PAIN, LEFT 04/01/2009  Sleep apnea   does not use Cpap Past Surgical History: Past Surgical History: Procedure Laterality Date  BREAST SURGERY    Biopsy benign/bil breaST  CARDIOVERSION N/A 12/15/2021  Procedure: CARDIOVERSION;  Surgeon: Skeet Latch, MD;  Location: [place];  Service: Cardiovascular;  Laterality: N/A;  CARDIOVERSION N/A 02/09/2022  Procedure: CARDIOVERSION;  Surgeon: [name], MD;  Location: [place];  Service: Cardiovascular;  Laterality: N/A;  CATARACT EXTRACTION    RIGHT EYE  COLONOSCOPY  2020  KN-MAC-suprep(good)-tics/TA's  COMBINED MEDIASTINOSCOPY AND BRONCHOSCOPY  08/2007  COMBINED MEDIASTINOSCOPY AND BRONCHOSCOPY  2009  Dental implant    FRACTURE SURGERY  ?02/1997  "left upper arm; put rod  in" (07/23/2012)  GUM SURGERY  2000-?2009  "several ORs; soft tissue graft; took material from roof of mouth" (07/23/2012  HUMERUS SURGERY Left 1998  rod insertion  KNEE ARTHROSCOPY  03/2004; 06/2009  "right; left" Dr. Theda Sers  KNEE SURGERY  2012  ARTHROSCOPIC LEFT KNEE  LYMPH NODE BIOPSY  ~ 2009  "for sarcoidosis; don't know exactly which nodes" (07/23/2102)  [place]  Open  POLYPECTOMY  2020  TA's  REFRACTIVE SURGERY  08/1998  "both eyes" (07/23/2012)  [place]  Bil  TOTAL KNEE ARTHROPLASTY Left 09/24/2016  Procedure: TOTAL KNEE ARTHROPLASTY;  Surgeon: Dorna Leitz, MD;  Location: [place];  Service: Orthopedics;  Laterality: Left; HPI: Pt is a 71 year old female admitted from SNF with severe sepsis and respiratory failure due to PNA. CXR shows hazy airspace opacity at both lung bases, left greater than right, suspicious for pneumonia or aspiration pneumonitis. PMH includes: tardive dyskinesia (started tx in August through [facility] movement clinic), CKD, depression, sarcoidosis, dyslipidemia, and hypertension.  Subjective: awake, alert, cooperative  Recommendations for follow up therapy are one component of a multi-disciplinary discharge planning process, led by the attending physician.  Recommendations may be updated based on patient status, additional functional criteria and insurance authorization. Assessment / Plan / Recommendation   06/19/2022   3:13 PM Clinical Impressions Clinical Impression Patient presents with a severe oropharyngeal dysphagia as per this MBS. Barium consistencies tested included: honey thick, nectar thick and thin liquids. Patient exhibited oral transit delays, piecemeal swallowing and moderate amount of oral residuals post initial swallows with each consistency. Thin liquids via single straw sip were assessed first and patient exhibited swallow initiation delay at level of pyriform sinus and moderate amount of sensed aspiration which occured during the swallow. Patient  grimacing when coughing and did appear to become anxious during coughing response. When coughing had subsided, SLP did assess her toleration with spoon sip of nectar thick liquids and a spoon sip of honey thick liquids. No penetration or aspiration with either of these consistencies, however they did result in moderate amount of barium residuals in vallecular sinus, pyriform sinus, posterior pharyngeal wall and just generally throughout pharynx. Presence of nasogastric feeding tube did appear to impact barium transit in esophagus but this is not likely the cause of her aspiration event and significant pharyngeal residuals. SLP is recommending continue NPO and allow PRN ice chips/water sips. SLP Visit Diagnosis Dysphagia, oropharyngeal phase (R13.12) Impact on safety and function Severe aspiration risk;Risk for inadequate nutrition/hydration     06/19/2022   3:13 PM Treatment Recommendations Treatment Recommendations Therapy as outlined in treatment plan below     06/19/2022   3:18 PM Prognosis Prognosis for Safe Diet Advancement Fair Barriers to Reach Goals Severity of deficits   06/19/2022   3:13 PM Diet Recommendations SLP Diet Recommendations NPO Medication Administration Via alternative means     06/19/2022   3:13 PM Other Recommendations Oral Care Recommendations Oral care QID;Staff/trained caregiver to provide oral care Follow Up Recommendations Skilled nursing-short term rehab (<3 hours/day) Functional Status Assessment Patient has had a recent decline in their functional status and demonstrates the ability to make significant improvements in function in a reasonable and predictable amount of time.   06/19/2022   3:13 PM Frequency and Duration  Speech Therapy Frequency (ACUTE ONLY) min 2x/week Treatment Duration 2 weeks     06/19/2022   3:07 PM Oral Phase Oral Phase Impaired Oral - Honey Teaspoon Weak lingual manipulation;Reduced posterior propulsion;Piecemeal swallowing;Decreased bolus cohesion;Delayed oral  transit;Lingual/palatal residue Oral - Nectar Teaspoon Delayed oral transit;Lingual/palatal residue;Weak lingual manipulation;Reduced posterior propulsion;Piecemeal swallowing Oral - Thin Straw Piecemeal swallowing;Premature spillage;Decreased bolus cohesion;Weak lingual manipulation;Reduced posterior propulsion    06/19/2022   3:12 PM Pharyngeal Phase Pharyngeal Material enters airway, passes BELOW cords and not ejected out despite cough attempt by patient    06/19/2022   3:09 PM Cervical Esophageal Phase  Cervical Esophageal Phase Impaired Cervical Esophageal Comment presence  of nasogastric feeding tube did appear to restrict barium transit Sonia Baller, MA, CCC-SLP Speech Therapy                     CT CHEST ABDOMEN PELVIS W CONTRAST  Result Date: 06/15/2022 CLINICAL DATA:  Concern for occult malignancy, profound weight loss. History of sarcoidosis. * Tracking Code: BO * EXAM: CT CHEST, ABDOMEN, AND PELVIS WITH CONTRAST TECHNIQUE: Multidetector CT imaging of the chest, abdomen and pelvis was performed following the standard protocol during bolus administration of intravenous contrast. RADIATION DOSE REDUCTION: This exam was performed according to the departmental dose-optimization program which includes automated exposure control, adjustment of the mA and/or kV according to patient size and/or use of iterative reconstruction technique. CONTRAST:  42m OMNIPAQUE IOHEXOL 350 MG/ML SOLN COMPARISON:  CT chest August 21, 2018. FINDINGS: CT CHEST FINDINGS Cardiovascular: Aortic atherosclerosis. No central pulmonary embolus on this nondedicated study. Mild cardiac enlargement. No significant pericardial effusion/thickening. Mediastinum/Nodes: Hypodense 12 mm nodule in the left lobe of the thyroid Not clinically significant; no follow-up imaging recommended (ref: J Am Coll Radiol. 2015 Feb;12(2): 143-50).No pathologically enlarged mediastinal, hilar or axillary lymph nodes. Single prominent left axillary lymph node  measures 6 mm in short axis on image 16/3 previously 8 mm on CT August 21, 2018. Enteric tube in the esophagus with tip in the stomach. Lungs/Pleura: Cluster of tiny nodules measure up to 3 mm pulmonary nodule in the left lung apex on image 24/4 is unchanged dating back to CT August 21, 2018. New 4 mm left lower lobe ground-glass pulmonary nodule on image 64/4. No additional pulmonary nodule identified on this motion degraded examination of the lungs. No pleural effusion. No pneumothorax. Musculoskeletal: Left humeral fixation hardware. Multilevel degenerative changes spine. No acute abnormality. CT ABDOMEN PELVIS FINDINGS Hepatobiliary: No suspicious hepatic lesion. Gallbladder is distended with some mild wall thickening. No biliary ductal dilation. No biliary ductal dilation. Pancreas: No pancreatic ductal dilation or evidence of acute inflammation. Spleen: No splenomegaly or focal splenic lesion. Adrenals/Urinary Tract: Lobular thickening of the left adrenal gland without discrete nodularity likely reflects hyperplasia/small adenomas and is considered benign requiring no independent imaging follow-up. Right adrenal gland appears normal. No hydronephrosis. Relative atrophy of the left kidney with lobular renal contour. Symmetric enhancement excretion of contrast in the bilateral kidneys. Urinary bladder is unremarkable for degree of distension. Stomach/Bowel: Enteric tube tip in the stomach. No pathologic dilation of small or large bowel. Appendix is not confidently identified however there is no pericecal inflammation. Moderate volume of formed stool in the colon with a large rectal stool ball rectal wall thickening and stranding in the perirectal fat. Colonic diverticulosis without findings of acute diverticulitis. Vascular/Lymphatic: Aortic atherosclerosis. No pathologically enlarged abdominal or pelvic lymph nodes. Reproductive: Gas in the vagina suggest correlation with recent instrumentation. No suspicious  uterine or adnexal lesion. Other: No significant abdominopelvic free fluid. Mild diffuse body wall edema. Musculoskeletal: No aggressive lytic or blastic lesion of bone. Multilevel degenerative changes spine. Degenerative changes bilateral hips. IMPRESSION: 1. No convincing evidence of primary malignancy or metastatic disease within the chest, abdomen or pelvis. 2. New 4 mm left lower lobe ground-glass pulmonary nodule, favored infectious or inflammatory. No follow-up recommended. This recommendation follows the consensus statement: Guidelines for Management of Incidental Pulmonary Nodules Detected on CT Images: From the Fleischner Society 2017; Radiology 2017; 284:228-243. 3. Gallbladder is distended with some mild wall thickening. If there is clinical concern for acute cholecystitis consider further evaluation with nuclear  medicine HIDA scan. 4. Moderate volume of formed stool in the colon with a large rectal stool ball and rectal wall thickening and stranding in the perirectal fat, findings which can be seen in the setting of stercoral colitis. 5. Colonic diverticulosis without findings of acute diverticulitis. 6. Gas in the vagina suggest correlation with recent instrumentation. 7.  Aortic Atherosclerosis (ICD10-I70.0). Electronically Signed   By: Dahlia Bailiff M.D.   On: 06/15/2022 13:00   MR BRAIN W CONTRAST  Result Date: 06/15/2022 CLINICAL DATA:  Headache.  History of sarcoidosis. EXAM: MRI HEAD WITH CONTRAST TECHNIQUE: Multiplanar, multiecho pulse sequences of the brain and surrounding structures were obtained with intravenous contrast. CONTRAST:  7.106m GADAVIST GADOBUTROL 1 MMOL/ML IV SOLN COMPARISON:  Brain MRI without contrast 06/13/2022 FINDINGS: There is no abnormal contrast enhancement within the brain. No abnormal calvarial or orbital enhancement. Specifically, there is no pachymeningeal enhancement as may be seen with intracranial hypotension. IMPRESSION: No pachymeningeal contrast enhancement  or other postcontrast abnormality. Electronically Signed   By: KUlyses JarredM.D.   On: 06/15/2022 00:50   MR CERVICAL SPINE W WO CONTRAST  Result Date: 06/14/2022 CLINICAL DATA:  CSF leak suspected. EXAM: MRI CERVICAL SPINE WITHOUT AND WITH CONTRAST TECHNIQUE: Multiplanar and multiecho pulse sequences of the cervical spine, to include the craniocervical junction and cervicothoracic junction, were obtained without and with intravenous contrast. CONTRAST:  7.534mGADAVIST GADOBUTROL 1 MMOL/ML IV SOLN COMPARISON:  None Available. FINDINGS: Alignment: Physiologic. Vertebrae: No fracture, evidence of discitis, or bone lesion. Cord: Normal signal and morphology. Posterior Fossa, vertebral arteries, paraspinal tissues: Negative. Disc levels: C1-2: Unremarkable. C2-3: Normal disc space and facet joints. There is no spinal canal stenosis. No neural foraminal stenosis. C3-4: Mild left facet hypertrophy. There is no spinal canal stenosis. No neural foraminal stenosis. C4-5: Normal disc space and facet joints. There is no spinal canal stenosis. No neural foraminal stenosis. C5-6: Mild right facet hypertrophy. There is no spinal canal stenosis. No neural foraminal stenosis. C6-7: Normal disc space and facet joints. There is no spinal canal stenosis. No neural foraminal stenosis. C7-T1: Small central disc protrusion. There is no spinal canal stenosis. No neural foraminal stenosis. IMPRESSION: 1. No acute abnormality of the cervical spine. 2. Mild cervical degenerative disc disease without spinal canal or neural foraminal stenosis. Electronically Signed   By: KeUlyses Jarred.D.   On: 06/14/2022 23:49   MR BRAIN WO CONTRAST  Result Date: 06/13/2022 CLINICAL DATA:  Rule out stroke. EXAM: MRI HEAD WITHOUT CONTRAST TECHNIQUE: Multiplanar, multiecho pulse sequences of the brain and surrounding structures were obtained without intravenous contrast. COMPARISON:  CT Brain 06/04/22 FINDINGS: Brain: No acute infarction,  hemorrhage, hydrocephalus, extra-axial collection or mass lesion.Sequela of mild-to-moderate chronic microvascular ischemic change. T2/FLAIR hyperintense signal is also seen within the central pons, favored to represent sequela of chronic microvascular ischemic change. Vascular: Normal flow voids. Skull and upper cervical spine: Normal marrow signal. Sinuses/Orbits: Bilateral lens replacement. Mild mucosal thickening bilateral anterior ethmoid air cells. Other: None. IMPRESSION: No acute intracranial abnormality. Electronically Signed   By: HeMarin Roberts.D.   On: 06/13/2022 16:34   DG Abd Portable 1V  Result Date: 06/06/2022 CLINICAL DATA:  Encounter for feeding tube placement. EXAM: PORTABLE ABDOMEN - 1 VIEW COMPARISON:  None Available. FINDINGS: The bowel gas pattern is normal. Weighted tip feeding tube with distal tip projecting over the pyloric region. No radio-opaque calculi or other significant radiographic abnormality are seen. IMPRESSION: Weighted tip feeding tube with distal tip projecting over  the pyloric region. Electronically Signed   By: Keane Police D.O.   On: 06/06/2022 12:09   ECHOCARDIOGRAM COMPLETE  Result Date: 06/05/2022    ECHOCARDIOGRAM REPORT   Patient Name:   DEBARA KAMPHUIS Date of Exam: 06/05/2022 Medical Rec #:  [id number]     Height:       66.0 in Accession #:    [id number]    Weight:       170.0 lb Date of Birth:  Sep 24, 1950     BSA:          1.866 m Patient Age:    57 years      BP:           106/72 mmHg Patient Gender: F             HR:           68 bpm. Exam Location:  Inpatient Procedure: 2D Echo, Cardiac Doppler and Color Doppler Indications:    Elevated Troponin  History:        Patient has prior history of Echocardiogram examinations, most                 recent 01/11/2022. Arrythmias:Atrial Fibrillation; Risk                 Factors:Hypertension, Sleep Apnea and Dyslipidemia. CKD (chronic                 kidney disease) stage 4, GFR 15-29 ml/min.  Sonographer:    Ronny Flurry Referring Phys: [phone] DEBBY CROSLEY IMPRESSIONS  1. Left ventricular ejection fraction, by estimation, is 55%. The left ventricle has normal function. The left ventricle has no regional wall motion abnormalities. There is mild asymmetric left ventricular hypertrophy. Left ventricular diastolic parameters are indeterminate.  2. Right ventricular systolic function is normal. The right ventricular size is normal.  3. Left atrial size was moderately dilated.  4. Right atrial size was moderately dilated.  5. The mitral valve is normal in structure. Trivial mitral valve regurgitation. No evidence of mitral stenosis.  6. The aortic valve is tricuspid. Aortic valve regurgitation is trivial. Aortic valve sclerosis/calcification is present, without any evidence of aortic stenosis. Comparison(s): No significant change from prior study. FINDINGS  Left Ventricle: Left ventricular ejection fraction, by estimation, is 55%. The left ventricle has normal function. The left ventricle has no regional wall motion abnormalities. The left ventricular internal cavity size was normal in size. There is mild asymmetric left ventricular hypertrophy. Left ventricular diastolic parameters are indeterminate. Right Ventricle: The right ventricular size is normal. Right vetricular wall thickness was not well visualized. Right ventricular systolic function is normal. Left Atrium: Left atrial size was moderately dilated. Right Atrium: Right atrial size was moderately dilated. Pericardium: There is no evidence of pericardial effusion. Mitral Valve: The mitral valve is normal in structure. Trivial mitral valve regurgitation. No evidence of mitral valve stenosis. Tricuspid Valve: The tricuspid valve is normal in structure. Tricuspid valve regurgitation is not demonstrated. No evidence of tricuspid stenosis. Aortic Valve: The aortic valve is tricuspid. Aortic valve regurgitation is trivial. Aortic valve sclerosis/calcification is present,  without any evidence of aortic stenosis. Aortic valve mean gradient measures 4.0 mmHg. Aortic valve peak gradient measures 7.1 mmHg. Aortic valve area, by VTI measures 1.91 cm. Pulmonic Valve: The pulmonic valve was not well visualized. Pulmonic valve regurgitation is not visualized. No evidence of pulmonic stenosis. Aorta: The ascending aorta was not well visualized and the aortic root is normal in  size and structure. IAS/Shunts: No atrial level shunt detected by color flow Doppler.  LEFT VENTRICLE PLAX 2D LVIDd:         4.00 cm   Diastology LVIDs:         3.00 cm   LV e' medial:    10.30 cm/s LV PW:         1.00 cm   LV E/e' medial:  9.4 LV IVS:        1.30 cm   LV e' lateral:   12.60 cm/s LVOT diam:     1.80 cm   LV E/e' lateral: 7.7 LV SV:         49 LV SV Index:   26 LVOT Area:     2.54 cm  RIGHT VENTRICLE             IVC RV S prime:     10.60 cm/s  IVC diam: 1.70 cm TAPSE (M-mode): 1.9 cm LEFT ATRIUM             Index        RIGHT ATRIUM           Index LA diam:        3.80 cm 2.04 cm/m   RA Area:     13.45 cm LA Vol (A2C):   54.9 ml 29.42 ml/m  RA Volume:   25.45 ml  13.64 ml/m LA Vol (A4C):   56.0 ml 30.01 ml/m LA Biplane Vol: 56.5 ml 30.27 ml/m  AORTIC VALVE AV Area (Vmax):    1.96 cm AV Area (Vmean):   1.84 cm AV Area (VTI):     1.91 cm AV Vmax:           133.33 cm/s AV Vmean:          91.667 cm/s AV VTI:            0.255 m AV Peak Grad:      7.1 mmHg AV Mean Grad:      4.0 mmHg LVOT Vmax:         102.70 cm/s LVOT Vmean:        66.200 cm/s LVOT VTI:          0.192 m LVOT/AV VTI ratio: 0.75  AORTA Ao Root diam: 2.70 cm Ao Asc diam:  2.50 cm MV E velocity: 96.80 cm/s  TRICUSPID VALVE                            TR Peak grad:   23.6 mmHg                            TR Vmax:        243.00 cm/s                             SHUNTS                            Systemic VTI:  0.19 m                            Systemic Diam: 1.80 cm Rudean Haskell MD Electronically signed by Rudean Haskell MD  Signature Date/Time: 06/05/2022/1:39:04 PM    Final    CT Head Wo Contrast  Result Date:  06/04/2022 CLINICAL DATA:  Delirium EXAM: CT HEAD WITHOUT CONTRAST TECHNIQUE: Contiguous axial images were obtained from the base of the skull through the vertex without intravenous contrast. RADIATION DOSE REDUCTION: This exam was performed according to the departmental dose-optimization program which includes automated exposure control, adjustment of the mA and/or kV according to patient size and/or use of iterative reconstruction technique. COMPARISON:  Head CT 04/01/2022.  MRI brain 04/02/2022. FINDINGS: Brain: No evidence of acute infarction, hemorrhage, hydrocephalus, extra-axial collection or mass lesion/mass effect. Vascular: No hyperdense vessel or unexpected calcification. Skull: Normal. Negative for fracture or focal lesion. Sinuses/Orbits: No acute finding. Other: None. IMPRESSION: No acute intracranial pathology. Electronically Signed   By: Ronney Asters M.D.   On: 06/04/2022 20:32   DG Chest Port 1 View  Result Date: 06/04/2022 CLINICAL DATA:  Sepsis EXAM: PORTABLE CHEST 1 VIEW COMPARISON:  04/01/2022 FINDINGS: Hazy airspace opacity of both lung bases, left greater than right. Mild cardiomegaly. Mediastinal contour unremarkable. No blunting of the costophrenic angles. Degenerative glenohumeral arthropathy bilaterally. IMPRESSION: 1. Hazy airspace opacity at both lung bases, left greater than right, suspicious for pneumonia or aspiration pneumonitis. 2. Mild cardiomegaly. 3. Degenerative glenohumeral arthropathy bilaterally. Electronically Signed   By: Van Clines M.D.   On: 06/04/2022 18:30    Today   Subjective    Tara [name] today has no headache,no chest abdominal pain,no new weakness tingling or numbness, she has chronic facial pain.   Objective   Blood pressure 127/72, pulse 82, temperature 97.8 F (36.6 C), temperature source Oral, resp. rate (!) 25, height _0  (1.676 m),  weight 82.4 kg, SpO2 99 %.   Intake/Output Summary (Last 24 hours) at 07/03/2022 1002 Last data filed at 07/03/2022 0213 Gross per 24 hour  Intake 1100 ml  Output --  Net 1100 ml    Exam  Awake Alert, No new F.N deficits,    [place].AT,PERRAL Supple Neck,   Symmetrical Chest wall movement, Good air movement bilaterally, CTAB RRR,No Gallops,   +ve B.Sounds, Abd Soft, Non tender, PEG No Cyanosis,trace edema   Data Review   Recent Labs  Lab 06/29/22 0300 06/30/22 0202 07/01/22 0158 07/02/22 0455 07/03/22 0425  WBC 13.7* 10.4 10.9* 8.5 7.2  HGB 10.3* 9.9* 10.2* 10.5* 10.8*  HCT 32.4* 30.3* 32.1* 32.1* 33.2*  PLT 170 169 184 164 175  MCV 93.4 91.8 93.0 90.9 91.2  MCH 29.7 30.0 29.6 29.7 29.7  MCHC 31.8 32.7 31.8 32.7 32.5  RDW 16.8* 16.5* 16.6* 16.3* 16.4*  LYMPHSABS  --   --   --  0.7 1.0  MONOABS  --   --   --  0.6 0.5  EOSABS  --   --   --  0.2 0.3  BASOSABS  --   --   --  0.0 0.1    Recent Labs  Lab 06/28/22 0941 06/29/22 0300 07/01/22 0154 07/01/22 0158 07/02/22 0455 07/03/22 0425  NA 147* 144  --  144 142 143  K 4.2 4.0  --  3.9 3.5 3.9  CL 109 109  --  108 105 104  CO2 29 29  --  _1 ANIONGAP 9 6  --  _2 GLUCOSE 171* 194*  --  160* 144* 116*  BUN 26* 26*  --  _3 CREATININE 0.95 0.99  --  0.92 0.89 0.83  CRP  --   --   --   --  3.7* 2.1*  PROCALCITON <0.10 0.17  0.17  --  <0.10 <0.10  BNP  --   --   --   --  457.3* 292.0*  MG  --   --   --   --  2.1 2.2  CALCIUM 9.4 8.4*  --  8.1* 8.2* 8.6*     Total Time in preparing paper work, data evaluation and todays exam - 35 minutes  Signature  -    Lala Lund M.D on 07/03/2022 at 10:02 AM   -  To page go to [email or web address]

## 2022-07-03 NOTE — TOC Transition Note (Signed)
Transition of Care [facility]) - CM/SW Discharge Note   Patient Details  Name: Tara [name] MRN: [id number] Date of Birth: 07/02/1951  [facility] CM/SW Contact:  Benard Halsted, LCSW Phone Number: 07/03/2022, 3:59 PM   Clinical Narrative:    Patient will DC to: [facility] Anticipated DC date: 07/03/22 Family notified: Anderson Malta, advocate. Pt will notify her sister Transport by: Corey Harold   Per MD patient ready for DC to [facility]. RN to call report prior to discharge [phone] ). RN, patient, patient's family, and facility notified of DC. Discharge Summary and FL2 sent to facility. DC packet on chart including signed script. Ambulance transport requested for patient.   CSW passed on patient's concerns about two missing pendants after her CT scan to the Charge RN.   CSW will sign off for now as social work intervention is no longer needed. Please consult [place] again if new needs arise.     Final next level of care: Skilled Nursing Facility Barriers to Discharge: Barriers Resolved   Patient Goals and CMS Choice Patient states their goals for this hospitalization and ongoing recovery are:: Rehab CMS Medicare.gov Compare Post Acute Care list provided to:: Patient Choice offered to / list presented to : Patient, Sibling  Discharge Placement   Existing PASRR number confirmed : 07/03/22          Patient [facility] bed at: [facility] Patient to be transferred to facility by: [facility] Name of family member notified: Anderson Malta Patient and family notified of of transfer: 07/03/22  Discharge Plan and Services In-house Referral: Clinical Social Work   Post Acute Care Choice: [place]                               Social Determinants of Health (SDOH) Interventions     Readmission Risk Interventions    06/14/2022    4:37 PM  Readmission Risk Prevention Plan  Transportation Screening Complete  Medication Review [employer])  Complete  PCP or Specialist appointment within 3-5 days of discharge Complete  HRI or Home Care Consult Complete  SW Recovery Care/Counseling Consult Complete  Palliative Care Screening Not [place] Complete

## 2022-07-05 DIAGNOSIS — A419 Sepsis, unspecified organism: Secondary | ICD-10-CM | POA: Diagnosis not present

## 2022-07-05 DIAGNOSIS — J189 Pneumonia, unspecified organism: Secondary | ICD-10-CM | POA: Diagnosis not present

## 2022-07-05 DIAGNOSIS — R2681 Unsteadiness on feet: Secondary | ICD-10-CM | POA: Diagnosis not present

## 2022-07-05 DIAGNOSIS — M6281 Muscle weakness (generalized): Secondary | ICD-10-CM | POA: Diagnosis not present

## 2022-07-05 LAB — CULTURE, BLOOD (ROUTINE X 2)
Culture: NO GROWTH
Culture: NO GROWTH
Special Requests: ADEQUATE

## 2022-07-11 DIAGNOSIS — J189 Pneumonia, unspecified organism: Secondary | ICD-10-CM | POA: Diagnosis not present

## 2022-07-11 DIAGNOSIS — A419 Sepsis, unspecified organism: Secondary | ICD-10-CM | POA: Diagnosis not present

## 2022-07-11 DIAGNOSIS — N1831 Chronic kidney disease, stage 3a: Secondary | ICD-10-CM | POA: Diagnosis not present

## 2022-07-11 DIAGNOSIS — M6281 Muscle weakness (generalized): Secondary | ICD-10-CM | POA: Diagnosis not present

## 2022-07-11 DIAGNOSIS — I480 Paroxysmal atrial fibrillation: Secondary | ICD-10-CM | POA: Diagnosis not present

## 2022-07-12 DIAGNOSIS — A419 Sepsis, unspecified organism: Secondary | ICD-10-CM | POA: Diagnosis not present

## 2022-07-12 DIAGNOSIS — R2681 Unsteadiness on feet: Secondary | ICD-10-CM | POA: Diagnosis not present

## 2022-07-12 DIAGNOSIS — J189 Pneumonia, unspecified organism: Secondary | ICD-10-CM | POA: Diagnosis not present

## 2022-07-12 DIAGNOSIS — L89152 Pressure ulcer of sacral region, stage 2: Secondary | ICD-10-CM | POA: Diagnosis not present

## 2022-07-12 DIAGNOSIS — M6281 Muscle weakness (generalized): Secondary | ICD-10-CM | POA: Diagnosis not present

## 2022-07-17 DIAGNOSIS — J189 Pneumonia, unspecified organism: Secondary | ICD-10-CM | POA: Diagnosis not present

## 2022-07-17 DIAGNOSIS — A419 Sepsis, unspecified organism: Secondary | ICD-10-CM | POA: Diagnosis not present

## 2022-07-17 DIAGNOSIS — N1831 Chronic kidney disease, stage 3a: Secondary | ICD-10-CM | POA: Diagnosis not present

## 2022-07-17 DIAGNOSIS — I480 Paroxysmal atrial fibrillation: Secondary | ICD-10-CM | POA: Diagnosis not present

## 2022-07-19 DIAGNOSIS — A419 Sepsis, unspecified organism: Secondary | ICD-10-CM | POA: Diagnosis not present

## 2022-07-19 DIAGNOSIS — M6281 Muscle weakness (generalized): Secondary | ICD-10-CM | POA: Diagnosis not present

## 2022-07-19 DIAGNOSIS — R2681 Unsteadiness on feet: Secondary | ICD-10-CM | POA: Diagnosis not present

## 2022-07-19 DIAGNOSIS — J189 Pneumonia, unspecified organism: Secondary | ICD-10-CM | POA: Diagnosis not present

## 2022-07-20 DIAGNOSIS — N1831 Chronic kidney disease, stage 3a: Secondary | ICD-10-CM | POA: Diagnosis not present

## 2022-07-20 DIAGNOSIS — I480 Paroxysmal atrial fibrillation: Secondary | ICD-10-CM | POA: Diagnosis not present

## 2022-07-20 DIAGNOSIS — I10 Essential (primary) hypertension: Secondary | ICD-10-CM | POA: Diagnosis not present

## 2022-07-20 DIAGNOSIS — R41841 Cognitive communication deficit: Secondary | ICD-10-CM | POA: Diagnosis not present

## 2022-07-23 DIAGNOSIS — M6281 Muscle weakness (generalized): Secondary | ICD-10-CM | POA: Diagnosis not present

## 2022-07-23 DIAGNOSIS — R2681 Unsteadiness on feet: Secondary | ICD-10-CM | POA: Diagnosis not present

## 2022-07-23 DIAGNOSIS — J189 Pneumonia, unspecified organism: Secondary | ICD-10-CM | POA: Diagnosis not present

## 2022-07-23 DIAGNOSIS — L89152 Pressure ulcer of sacral region, stage 2: Secondary | ICD-10-CM | POA: Diagnosis not present

## 2022-07-23 DIAGNOSIS — A419 Sepsis, unspecified organism: Secondary | ICD-10-CM | POA: Diagnosis not present

## 2022-07-26 DIAGNOSIS — M6281 Muscle weakness (generalized): Secondary | ICD-10-CM | POA: Diagnosis not present

## 2022-07-26 DIAGNOSIS — J189 Pneumonia, unspecified organism: Secondary | ICD-10-CM | POA: Diagnosis not present

## 2022-07-26 DIAGNOSIS — A419 Sepsis, unspecified organism: Secondary | ICD-10-CM | POA: Diagnosis not present

## 2022-07-26 DIAGNOSIS — R2681 Unsteadiness on feet: Secondary | ICD-10-CM | POA: Diagnosis not present

## 2022-07-30 DIAGNOSIS — R2681 Unsteadiness on feet: Secondary | ICD-10-CM | POA: Diagnosis not present

## 2022-07-30 DIAGNOSIS — M6281 Muscle weakness (generalized): Secondary | ICD-10-CM | POA: Diagnosis not present

## 2022-07-30 DIAGNOSIS — A419 Sepsis, unspecified organism: Secondary | ICD-10-CM | POA: Diagnosis not present

## 2022-07-30 DIAGNOSIS — J189 Pneumonia, unspecified organism: Secondary | ICD-10-CM | POA: Diagnosis not present

## 2022-08-02 DIAGNOSIS — R2681 Unsteadiness on feet: Secondary | ICD-10-CM | POA: Diagnosis not present

## 2022-08-02 DIAGNOSIS — R41841 Cognitive communication deficit: Secondary | ICD-10-CM | POA: Diagnosis not present

## 2022-08-02 DIAGNOSIS — J189 Pneumonia, unspecified organism: Secondary | ICD-10-CM | POA: Diagnosis not present

## 2022-08-02 DIAGNOSIS — I480 Paroxysmal atrial fibrillation: Secondary | ICD-10-CM | POA: Diagnosis not present

## 2022-08-02 DIAGNOSIS — A419 Sepsis, unspecified organism: Secondary | ICD-10-CM | POA: Diagnosis not present

## 2022-08-02 DIAGNOSIS — M6281 Muscle weakness (generalized): Secondary | ICD-10-CM | POA: Diagnosis not present

## 2022-08-02 DIAGNOSIS — I10 Essential (primary) hypertension: Secondary | ICD-10-CM | POA: Diagnosis not present

## 2022-08-07 DIAGNOSIS — M6281 Muscle weakness (generalized): Secondary | ICD-10-CM | POA: Diagnosis not present

## 2022-08-07 DIAGNOSIS — R2681 Unsteadiness on feet: Secondary | ICD-10-CM | POA: Diagnosis not present

## 2022-08-07 DIAGNOSIS — A419 Sepsis, unspecified organism: Secondary | ICD-10-CM | POA: Diagnosis not present

## 2022-08-07 DIAGNOSIS — J189 Pneumonia, unspecified organism: Secondary | ICD-10-CM | POA: Diagnosis not present

## 2022-08-08 DIAGNOSIS — E559 Vitamin D deficiency, unspecified: Secondary | ICD-10-CM | POA: Diagnosis not present

## 2022-08-09 DIAGNOSIS — M6281 Muscle weakness (generalized): Secondary | ICD-10-CM | POA: Diagnosis not present

## 2022-08-09 DIAGNOSIS — R2681 Unsteadiness on feet: Secondary | ICD-10-CM | POA: Diagnosis not present

## 2022-08-09 DIAGNOSIS — A419 Sepsis, unspecified organism: Secondary | ICD-10-CM | POA: Diagnosis not present

## 2022-08-09 DIAGNOSIS — J189 Pneumonia, unspecified organism: Secondary | ICD-10-CM | POA: Diagnosis not present

## 2022-08-13 DIAGNOSIS — R2681 Unsteadiness on feet: Secondary | ICD-10-CM | POA: Diagnosis not present

## 2022-08-13 DIAGNOSIS — J189 Pneumonia, unspecified organism: Secondary | ICD-10-CM | POA: Diagnosis not present

## 2022-08-13 DIAGNOSIS — A419 Sepsis, unspecified organism: Secondary | ICD-10-CM | POA: Diagnosis not present

## 2022-08-13 DIAGNOSIS — M6281 Muscle weakness (generalized): Secondary | ICD-10-CM | POA: Diagnosis not present

## 2022-08-16 DIAGNOSIS — R2681 Unsteadiness on feet: Secondary | ICD-10-CM | POA: Diagnosis not present

## 2022-08-16 DIAGNOSIS — J189 Pneumonia, unspecified organism: Secondary | ICD-10-CM | POA: Diagnosis not present

## 2022-08-16 DIAGNOSIS — A419 Sepsis, unspecified organism: Secondary | ICD-10-CM | POA: Diagnosis not present

## 2022-08-16 DIAGNOSIS — M6281 Muscle weakness (generalized): Secondary | ICD-10-CM | POA: Diagnosis not present

## 2022-08-20 DIAGNOSIS — J189 Pneumonia, unspecified organism: Secondary | ICD-10-CM | POA: Diagnosis not present

## 2022-08-20 DIAGNOSIS — A419 Sepsis, unspecified organism: Secondary | ICD-10-CM | POA: Diagnosis not present

## 2022-08-20 DIAGNOSIS — R2681 Unsteadiness on feet: Secondary | ICD-10-CM | POA: Diagnosis not present

## 2022-08-20 DIAGNOSIS — M6281 Muscle weakness (generalized): Secondary | ICD-10-CM | POA: Diagnosis not present

## 2022-08-23 DIAGNOSIS — J189 Pneumonia, unspecified organism: Secondary | ICD-10-CM | POA: Diagnosis not present

## 2022-08-23 DIAGNOSIS — M6281 Muscle weakness (generalized): Secondary | ICD-10-CM | POA: Diagnosis not present

## 2022-08-23 DIAGNOSIS — A419 Sepsis, unspecified organism: Secondary | ICD-10-CM | POA: Diagnosis not present

## 2022-08-23 DIAGNOSIS — R2681 Unsteadiness on feet: Secondary | ICD-10-CM | POA: Diagnosis not present

## 2022-08-27 DIAGNOSIS — A419 Sepsis, unspecified organism: Secondary | ICD-10-CM | POA: Diagnosis not present

## 2022-08-27 DIAGNOSIS — M6281 Muscle weakness (generalized): Secondary | ICD-10-CM | POA: Diagnosis not present

## 2022-08-27 DIAGNOSIS — J189 Pneumonia, unspecified organism: Secondary | ICD-10-CM | POA: Diagnosis not present

## 2022-08-27 DIAGNOSIS — R2681 Unsteadiness on feet: Secondary | ICD-10-CM | POA: Diagnosis not present

## 2022-08-30 ENCOUNTER — Encounter: Payer: Self-pay | Admitting: Internal Medicine

## 2022-08-30 ENCOUNTER — Ambulatory Visit: Payer: BC Managed Care – PPO | Attending: Internal Medicine | Admitting: Internal Medicine

## 2022-08-30 VITALS — BP 139/80 | HR 96 | Ht 66.0 in | Wt 179.0 lb

## 2022-08-30 DIAGNOSIS — M6281 Muscle weakness (generalized): Secondary | ICD-10-CM | POA: Diagnosis not present

## 2022-08-30 DIAGNOSIS — I480 Paroxysmal atrial fibrillation: Secondary | ICD-10-CM | POA: Diagnosis not present

## 2022-08-30 DIAGNOSIS — A419 Sepsis, unspecified organism: Secondary | ICD-10-CM | POA: Diagnosis not present

## 2022-08-30 DIAGNOSIS — R2681 Unsteadiness on feet: Secondary | ICD-10-CM | POA: Diagnosis not present

## 2022-08-30 DIAGNOSIS — J189 Pneumonia, unspecified organism: Secondary | ICD-10-CM | POA: Diagnosis not present

## 2022-08-30 NOTE — Progress Notes (Signed)
Cardiology Office Note:    Date:  08/30/2022   ID:  Cyrenity Ayler, DOB 10-Jul-1951, MRN [id number]  PCP:  Biagio Borg, MD   [facility] Cardiologist:  Janina Mayo, MD     Referring MD: Biagio Borg, MD   No chief complaint on file. Atrial Fibrillation  History of Present Illness:    Tara [name] is a 72 y.o. female with a hx of CKD, sarcoidosis (+biopsy), HTN, referral for paroxysmal atrial fibrillation  She was seen by GI for surveillance colonoscopy. During her colonoscopy noted to be in afib.  She broought in the 12 lead which shows atrial fibrillation.  TSH is normal. She has sleep apnea, difficulty with cpap adherence 2/2 dry eye exacerbation. She has a sleep study pending with pulmonology, sees Dr. Annamaria Boots. She's planned for repeat sleep study. She's being considered for Inspire. Blood pressures are well controlled. She denies significant ETOH. No GI bleeding history. No hx of stroke.  She's felt more tired than usual. She does not sense the palpations. No PND, orthopnea or LE edema.   Interim Hx 8/10 She is s/p DCCV , converted to junctional bradycardia. Her atenolol was stopped. She was seen by Dr. Annamaria Boots.  She can't tolerate her CPAP. She notes eye dryness. She was recommended to use an eye protector. She is being considered for inspire. She does a lot of walking, and she can feel she is running out of breath, but she can continue a 20 minute work out. She underwent another DCCV 7/28. She started flecinaide 50 mg BID and continues to follow with afib clinic. No PND or orthopnea. No LE edema  Interim Hx 9/1 She went to the ED and noted dizziness and weakness. She was walking outside for exercise and was dehydrated. She feels better  Interim hx 04/25/2022  She had a fall and UTI and was admitted to the hospital 9/17-9/25. She was then sent to a SNF.  She fell again at the SNF. She was trying to reach for the call button. She had a mechanical. I saw her initially in  the ED and suggested she needed long-term care. She is still at [facility]. She was seen in afib flinic 9/11.Has been in persistent afib.  They stopped flecainide because it was ineffective in keeping her in sinus rhythm. She had a cardiac  monitor that finished 10/9 that  showed 100% afib. Follow up with Ep to discuss ablation. Today, she comes in with further deterioration. She's in a wheelchair.   Interim hx 08/30/2022 She has had further debilitation. She was admitted for sepsis and aspiration PNA. She was diagnosed with neurosarcoidosis. No palpitations.  She denies LH or SOB.  No bleeding. BP is in good control  Cardiology Studies: TTE 12/10/2015: EF normal, LAVi 41 cc/m2 TTE 01/11/2022: Normal LV function, LAVi 49 cc/m2 [place] 03/26/2022-04/23/2022: 100% afib  Past Medical History:  Diagnosis Date   Anemia    on meds   ANKLE PAIN, LEFT 04/01/2008   ANXIETY 04/17/2007   on meds   Arthritis    generalized   Cataract    bilateral sx   Chronic kidney disease    stage 3   Colon polyp    COLONIC POLYPS, HX OF 08/01/2007   CONTUSIONS, MULTIPLE 04/01/2009   DEPRESSION 04/17/2007   on meds   DIZZINESS 08/01/2007   DYSPNEA 08/01/2007   with exertion   Enlargement of lymph nodes 08/13/2007   Excessive involuntary blinking    per pt,going  on since 2018   Glaucoma    on meds   GLUCOSE INTOLERANCE 08/01/2007   Hypercalcemia due to sarcoidosis 2014   HYPERLIPIDEMIA 08/01/2007   no meds   HYPERSOMNIA 07/28/2008   HYPERTENSION 04/17/2007   Impaired glucose tolerance 03/23/2011   JOINT EFFUSION, LEFT KNEE 06/02/2010   Loose body in knee 04/01/2009   pt not sure?   Metabolic encephalopathy [id number]   Migraines    "stopped 3-4 yr ago" (07/23/2012)   Morbid obesity ([place]) 04/20/2007   OTHER DISEASES OF LUNG NOT ELSEWHERE CLASSIFIED 08/01/2007   Pain in joint, lower leg 04/01/2009   PERIPHERAL EDEMA 04/21/2009   Pre-diabetes    Sarcoidosis 09/25/2007   Seasonal allergies     SHOULDER PAIN, LEFT 04/01/2009   Sleep apnea    does not use Cpap    Past Surgical History:  Procedure Laterality Date   BREAST SURGERY     Biopsy benign/bil breaST   CARDIOVERSION N/A 12/15/2021   Procedure: CARDIOVERSION;  Surgeon: Skeet Latch, MD;  Location: [place];  Service: Cardiovascular;  Laterality: N/A;   CARDIOVERSION N/A 02/09/2022   Procedure: CARDIOVERSION;  Surgeon: [name], MD;  Location: [place];  Service: Cardiovascular;  Laterality: N/A;   CATARACT EXTRACTION     RIGHT EYE   COLONOSCOPY  2020   KN-MAC-suprep(good)-tics/TA's   COMBINED MEDIASTINOSCOPY AND BRONCHOSCOPY  08/2007   COMBINED MEDIASTINOSCOPY AND BRONCHOSCOPY  2009   Dental implant     FRACTURE SURGERY  ?02/1997   "left upper arm; put rod in" (07/23/2012)   GUM SURGERY  2000-?2009   "several ORs; soft tissue graft; took material from roof of mouth" (07/23/2012   HUMERUS SURGERY Left 1998   rod insertion   IR GASTROSTOMY TUBE MOD SED  06/22/2022   KNEE ARTHROSCOPY  03/2004; 06/2009   "right; left" Dr. Theda Sers   KNEE SURGERY  2012   ARTHROSCOPIC LEFT KNEE   LYMPH NODE BIOPSY  ~ 2009   "for sarcoidosis; don't know exactly which nodes" (07/23/2102)   [place]   Open   POLYPECTOMY  2020   TA's   REFRACTIVE SURGERY  08/1998   "both eyes" (07/23/2012)   [place]   Bil   TOTAL KNEE ARTHROPLASTY Left 09/24/2016   Procedure: TOTAL KNEE ARTHROPLASTY;  Surgeon: Dorna Leitz, MD;  Location: [place];  Service: Orthopedics;  Laterality: Left;    Current Medications: Current Outpatient Medications on File Prior to Visit  Medication Sig Dispense Refill   amoxicillin (AMOXIL) 500 MG tablet Take 2,000 mg by mouth once. Prior to dental appointments     antiseptic oral rinse (BIOTENE) LIQD 15 mLs by Mouth Rinse route 3 (three) times daily as needed for dry mouth or mouth pain.     apixaban (ELIQUIS) 5 MG TABS tablet Place 1 tablet (5 mg total) into feeding tube 2 (two) times  daily. 180 tablet 1   brimonidine-timolol (COMBIGAN) 0.2-0.5 % ophthalmic solution Place 1 drop into both eyes every 12 (twelve) hours.     carboxymethylcellulose (REFRESH PLUS) 0.5 % SOLN Place 1 drop into both eyes 3 (three) times daily as needed (Dry eyes). RETAIN eye drops instead of refresh     cholecalciferol (CHOLECALCIFEROL) 25 MCG tablet Place 1 tablet (1,000 Units total) into feeding tube daily.     clorazepate (TRANXENE) 7.5 MG tablet Place 1 tablet (7.5 mg total) into feeding tube 2 (two) times daily. 4 tablet 0   desvenlafaxine (PRISTIQ) 100 MG 24 hr tablet  Place 1 tablet (100 mg total) into feeding tube at bedtime.  3   diltiazem (CARDIZEM) 60 MG tablet Place 1 tablet (60 mg total) into feeding tube every 8 (eight) hours.     ferrous sulfate 300 (60 Fe) MG/5ML syrup Place 5 mLs (300 mg total) into feeding tube daily with breakfast. 150 mL 3   flecainide (TAMBOCOR) 50 MG tablet Place 1 tablet (50 mg total) into feeding tube 2 (two) times daily.     gabapentin (NEURONTIN) 250 MG/5ML solution Place 2 mLs (100 mg total) into feeding tube 3 (three) times daily.  12   hydrALAZINE (APRESOLINE) 100 MG tablet Place 1 tablet (100 mg total) into feeding tube 3 (three) times daily.     INGREZZA 40 MG capsule Take 40 mg by mouth daily.     isosorbide dinitrate (ISORDIL) 10 MG tablet Place 1 tablet (10 mg total) into feeding tube 3 (three) times daily.     latanoprost (XALATAN) 0.005 % ophthalmic solution Place 1 drop into both eyes at bedtime.     melatonin 5 MG TABS Place 2 tablets (10 mg total) into feeding tube at bedtime.  0   Multiple Vitamins-Minerals (MULTIVITAMIN WITH MINERALS) tablet Place 1 tablet into feeding tube daily.     Nutritional Supplements (FEEDING SUPPLEMENT, OSMOLITE 1.5 CAL,) LIQD 60 mLs by Per J Tube route continuous.     Omega 3 1200 MG CAPS Place 2 capsules (2,400 mg total) into feeding tube at bedtime.     oxyCODONE (OXY IR/ROXICODONE) 5 MG immediate release tablet Place  1 tablet (5 mg total) into feeding tube every 6 (six) hours as needed for moderate pain. 4 tablet 0   pantoprazole sodium (PROTONIX) 40 mg Place 40 mg into feeding tube daily. 30 packet    polyethylene glycol (MIRALAX / GLYCOLAX) 17 g packet Place 17 g into feeding tube 2 (two) times daily. 14 each 0   senna (SENOKOT) 8.6 MG tablet Place 1 tablet (8.6 mg total) into feeding tube daily.     Water For Irrigation, Sterile (FREE WATER) SOLN Place 250 mLs into feeding tube every 6 (six) hours.     No current facility-administered medications on file prior to visit.     Allergies:   Sulfa antibiotics and Fluoxetine hcl   Social History   Socioeconomic History   Marital status: Widowed    Spouse name: Not on file   Number of children: Not on file   Years of education: Not on file   Highest education level: Not on file  Occupational History   Occupation: Designer, jewellery  Tobacco Use   Smoking status: Never   Smokeless tobacco: Never   Tobacco comments:    Never smoke 12/25/21  Vaping Use   Vaping Use: Never used  Substance and Sexual Activity   Alcohol use: Yes    Alcohol/week: 1.0 - 2.0 standard drink of alcohol    Types: 1 - 2 Standard drinks or equivalent per week   Drug use: No   Sexual activity: Not Currently    Birth control/protection: Post-menopausal    Comment: 1st intercourse 74 yo-1 partner  Other Topics Concern   Not on file  Social History Narrative   Lives with 4 cats   Social Determinants of Health   Financial Resource Strain: Not on file  Food Insecurity: No Food Insecurity (06/14/2022)   Hunger Vital Sign    Worried About Running Out of Food in the Last Year: Never true    Ran  Out of Food in the Last Year: Never true  Transportation Needs: No Transportation Needs (06/14/2022)   PRAPARE - Hydrologist (Medical): No    Lack of Transportation (Non-Medical): No  Physical Activity: Not on file  Stress: Not on file  Social  Connections: Not on file     Family History: The patient's family history includes Asthma in her sister; Diabetes in her mother; Gout in her sister; Heart disease (age of onset: 37) in her father; Hypertension in her father, mother, and sister; Stroke in her mother. There is no history of Colon cancer, Breast cancer, Esophageal cancer, Stomach cancer, Rectal cancer, or Colon polyps.  ROS:   Please see the history of present illness.     All other systems reviewed and are negative.  EKGs/Labs/Other Studies Reviewed:    The following studies were reviewed today:   EKG:  EKG is  ordered today.  The ekg ordered today demonstrates   11/23/2021-Rate controlled atrial fibrillation   02/22/2022- atrial fibrillation rate 80  Recent Labs: 06/04/2022: TSH 1.137 06/07/2022: ALT 45 07/03/2022: B Natriuretic Peptide 292.0; BUN 19; Creatinine, Ser 0.83; Hemoglobin 10.8; Magnesium 2.2; Platelets 175; Potassium 3.9; Sodium 143    Recent Lipid Panel    Component Value Date/Time   CHOL 110 10/10/2021 1013   TRIG 56.0 10/10/2021 1013   HDL 56.20 10/10/2021 1013   CHOLHDL 2 10/10/2021 1013   VLDL 11.2 10/10/2021 1013   LDLCALC 43 10/10/2021 1013   LDLDIRECT 50.0 09/24/2017 1614     Risk Assessment/Calculations:    CHA2DS2-VASc Score = 3   This indicates a 3.2% annual risk of stroke. The patient's score is based upon: CHF History: 0 HTN History: 1 Diabetes History: 0 Stroke History: 0 Vascular Disease History: 0 Age Score: 1 Gender Score: 1          Physical Exam:    VS:   Vitals:   08/30/22 0913  BP: 139/80  Pulse: 96  SpO2: 97%      Wt Readings from Last 3 Encounters:  08/30/22 179 lb (81.2 kg)  07/03/22 181 lb 10.5 oz (82.4 kg)  04/25/22 181 lb (82.1 kg)     GEN:  Well nourished, well developed in no acute distress HEENT:Thrush NECK: No JVD; No carotid bruits LYMPHATICS: No lymphadenopathy CARDIAC: irregularly irregular no murmurs, rubs, gallops RESPIRATORY:   Clear to auscultation without rales, wheezing or rhonchi  ABDOMEN: Soft, non-tender, non-distended MUSCULOSKELETAL:  No edema; No deformity  SKIN: Warm and dry NEUROLOGIC:  Alert and oriented x 3 PSYCHIATRIC:  Normal affect   ASSESSMENT:    FTT: she has deteriorated quite rapidly since I first met her. C/f ?neurodegenerative dx/ parkinson's disease?Marland Kitchen She sees neurology at [place] for tardive dyskinesia on ingrezza She has dysphagia. She had thrush on exam today. - started nystatin suspension for 7 days - prior Qtc 10/9 502 ms on ingrezza, can continue to monitor, if further increase, would consider consider stopping --> now know this is neurosarcoidosis s/p PEG tube  Persistent Atrial Fibrillation: New Onset 2023. CHADS2VASC 3. It is challenging maintaining her in sinus rhythm. Her LA is severely dilated. Highly encouraged continuing her CPAP with the eye protector for OSA in the past. She was seenin afib clinic 9/11 and plan to stop flecainide and consider PVI. This was not stopped after last hospital visit. I don't think she is a good candidate for ablation at this point. Can continue flecainide, was restarted in the hospital in  December 2023 -continue diltiazem 240 mg daily  -continue eliquis 5 mg BID -continue flecainide 50 mg BID  Fall- mechanical, non cardiac related.   HTN- well controlled. Continue current regimen above  CKD Stage III- GFR 48. Stopped prior lasix 40 mg BID with dehydration and no congestive HF  PLAN:    In order of problems listed above:   Follow up in one year      Medication Adjustments/Labs and Tests Ordered: Current medicines are reviewed at length with the patient today.  Concerns regarding medicines are outlined above.  No orders of the defined types were placed in this encounter.  No orders of the defined types were placed in this encounter.   Patient Instructions  Medication Instructions:  Your physician recommends that you continue on your  current medications as directed. Please refer to the Current Medication list given to you today.  *If you need a refill on your cardiac medications before your next appointment, please call your pharmacy*   Lab Work: NONE If you have labs (blood work) drawn today and your tests are completely normal, you will receive your results only by: [place] (if you have MyChart) OR A paper copy in the mail If you have any lab test that is abnormal or we need to change your treatment, we will call you to review the results.   Testing/Procedures: NONE   Follow-Up: At [facility], you and your health needs are our priority.  As part of our continuing mission to provide you with exceptional heart care, we have created designated Provider Care Teams.  These Care Teams include your primary Cardiologist (physician) and Advanced Practice Providers (APPs -  Physician Assistants and Nurse Practitioners) who all work together to provide you with the care you need, when you need it.  We recommend signing up for the patient portal called "MyChart".  Sign up information is provided on this After Visit Summary.  MyChart is used to connect with patients for Virtual Visits (Telemedicine).  Patients are able to view lab/test results, encounter notes, upcoming appointments, etc.  Non-urgent messages can be sent to your provider as well.   To learn more about what you can do with MyChart, go to [email or web address].    Your next appointment:   1 year(s)  Provider:   Janina Mayo, MD      Signed, Janina Mayo, MD  08/30/2022 9:29 AM    [place]

## 2022-08-30 NOTE — Patient Instructions (Signed)
Medication Instructions:  Your physician recommends that you continue on your current medications as directed. Please refer to the Current Medication list given to you today.  *If you need a refill on your cardiac medications before your next appointment, please call your pharmacy*   Lab Work: NONE If you have labs (blood work) drawn today and your tests are completely normal, you will receive your results only by: [place] (if you have MyChart) OR A paper copy in the mail If you have any lab test that is abnormal or we need to change your treatment, we will call you to review the results.   Testing/Procedures: NONE   Follow-Up: At [facility], you and your health needs are our priority.  As part of our continuing mission to provide you with exceptional heart care, we have created designated Provider Care Teams.  These Care Teams include your primary Cardiologist (physician) and Advanced Practice Providers (APPs -  Physician Assistants and Nurse Practitioners) who all work together to provide you with the care you need, when you need it.  We recommend signing up for the patient portal called "MyChart".  Sign up information is provided on this After Visit Summary.  MyChart is used to connect with patients for Virtual Visits (Telemedicine).  Patients are able to view lab/test results, encounter notes, upcoming appointments, etc.  Non-urgent messages can be sent to your provider as well.   To learn more about what you can do with MyChart, go to [email or web address].    Your next appointment:   1 year(s)  Provider:   Janina Mayo, MD

## 2022-09-03 DIAGNOSIS — A419 Sepsis, unspecified organism: Secondary | ICD-10-CM | POA: Diagnosis not present

## 2022-09-03 DIAGNOSIS — M6281 Muscle weakness (generalized): Secondary | ICD-10-CM | POA: Diagnosis not present

## 2022-09-03 DIAGNOSIS — R2681 Unsteadiness on feet: Secondary | ICD-10-CM | POA: Diagnosis not present

## 2022-09-03 DIAGNOSIS — J189 Pneumonia, unspecified organism: Secondary | ICD-10-CM | POA: Diagnosis not present

## 2022-09-06 ENCOUNTER — Encounter: Payer: Self-pay | Admitting: Podiatry

## 2022-09-06 DIAGNOSIS — A419 Sepsis, unspecified organism: Secondary | ICD-10-CM | POA: Diagnosis not present

## 2022-09-06 DIAGNOSIS — R2681 Unsteadiness on feet: Secondary | ICD-10-CM | POA: Diagnosis not present

## 2022-09-06 DIAGNOSIS — M6281 Muscle weakness (generalized): Secondary | ICD-10-CM | POA: Diagnosis not present

## 2022-09-06 DIAGNOSIS — J189 Pneumonia, unspecified organism: Secondary | ICD-10-CM | POA: Diagnosis not present

## 2022-09-10 DIAGNOSIS — I480 Paroxysmal atrial fibrillation: Secondary | ICD-10-CM | POA: Diagnosis not present

## 2022-09-10 DIAGNOSIS — R41841 Cognitive communication deficit: Secondary | ICD-10-CM | POA: Diagnosis not present

## 2022-09-10 DIAGNOSIS — U071 COVID-19: Secondary | ICD-10-CM | POA: Diagnosis not present

## 2022-09-10 DIAGNOSIS — N1831 Chronic kidney disease, stage 3a: Secondary | ICD-10-CM | POA: Diagnosis not present

## 2022-09-27 ENCOUNTER — Encounter: Payer: Self-pay | Admitting: Podiatry

## 2022-09-28 ENCOUNTER — Ambulatory Visit: Payer: BC Managed Care – PPO | Admitting: Podiatry

## 2022-10-04 DIAGNOSIS — U071 COVID-19: Secondary | ICD-10-CM | POA: Diagnosis not present

## 2022-10-04 DIAGNOSIS — I480 Paroxysmal atrial fibrillation: Secondary | ICD-10-CM | POA: Diagnosis not present

## 2022-10-04 DIAGNOSIS — G4733 Obstructive sleep apnea (adult) (pediatric): Secondary | ICD-10-CM | POA: Diagnosis not present

## 2022-10-04 DIAGNOSIS — R1311 Dysphagia, oral phase: Secondary | ICD-10-CM | POA: Diagnosis not present

## 2022-10-05 DIAGNOSIS — G2401 Drug induced subacute dyskinesia: Secondary | ICD-10-CM | POA: Diagnosis not present

## 2022-10-22 ENCOUNTER — Ambulatory Visit: Payer: BC Managed Care – PPO | Admitting: Podiatry

## 2022-10-25 ENCOUNTER — Emergency Department (HOSPITAL_COMMUNITY): Payer: BC Managed Care – PPO

## 2022-10-25 ENCOUNTER — Emergency Department (HOSPITAL_COMMUNITY)
Admission: EM | Admit: 2022-10-25 | Discharge: 2022-10-25 | Disposition: A | Discharge status: Rehabilitation Facility | Payer: BC Managed Care – PPO | Attending: Emergency Medicine | Admitting: Emergency Medicine

## 2022-10-25 ENCOUNTER — Other Ambulatory Visit: Payer: Self-pay

## 2022-10-25 DIAGNOSIS — I10 Essential (primary) hypertension: Secondary | ICD-10-CM | POA: Diagnosis not present

## 2022-10-25 DIAGNOSIS — R1310 Dysphagia, unspecified: Secondary | ICD-10-CM | POA: Diagnosis not present

## 2022-10-25 DIAGNOSIS — N189 Chronic kidney disease, unspecified: Secondary | ICD-10-CM | POA: Insufficient documentation

## 2022-10-25 DIAGNOSIS — G9341 Metabolic encephalopathy: Secondary | ICD-10-CM | POA: Diagnosis not present

## 2022-10-25 DIAGNOSIS — R531 Weakness: Secondary | ICD-10-CM | POA: Diagnosis not present

## 2022-10-25 DIAGNOSIS — D649 Anemia, unspecified: Secondary | ICD-10-CM | POA: Diagnosis not present

## 2022-10-25 DIAGNOSIS — G2401 Drug induced subacute dyskinesia: Secondary | ICD-10-CM | POA: Insufficient documentation

## 2022-10-25 DIAGNOSIS — Z7901 Long term (current) use of anticoagulants: Secondary | ICD-10-CM | POA: Insufficient documentation

## 2022-10-25 DIAGNOSIS — Z7401 Bed confinement status: Secondary | ICD-10-CM | POA: Diagnosis not present

## 2022-10-25 DIAGNOSIS — D869 Sarcoidosis, unspecified: Secondary | ICD-10-CM | POA: Insufficient documentation

## 2022-10-25 DIAGNOSIS — K9423 Gastrostomy malfunction: Secondary | ICD-10-CM | POA: Insufficient documentation

## 2022-10-25 DIAGNOSIS — R030 Elevated blood-pressure reading, without diagnosis of hypertension: Secondary | ICD-10-CM

## 2022-10-25 DIAGNOSIS — T85528A Displacement of other gastrointestinal prosthetic devices, implants and grafts, initial encounter: Secondary | ICD-10-CM

## 2022-10-25 DIAGNOSIS — I959 Hypotension, unspecified: Secondary | ICD-10-CM | POA: Diagnosis not present

## 2022-10-25 HISTORY — PX: IR REPLC GASTRO/COLONIC TUBE PERCUT W/FLUORO: IMG2333

## 2022-10-25 MED ORDER — ACETAMINOPHEN 160 MG/5ML PO SOLN
650.0000 mg | Freq: Four times a day (QID) | ORAL | Status: DC | PRN
  Administered 2022-10-25: 650 mg
  Filled 2022-10-25: qty 20.3

## 2022-10-25 MED ORDER — STERILE WATER FOR INJECTION IJ SOLN
INTRAMUSCULAR | Status: AC
  Filled 2022-10-25: qty 10

## 2022-10-25 MED ORDER — IOHEXOL 300 MG/ML  SOLN
50.0000 mL | Freq: Once | INTRAMUSCULAR | Status: AC | PRN
  Administered 2022-10-25: 30 mL

## 2022-10-25 MED ORDER — HYDRALAZINE HCL 50 MG PO TABS
100.0000 mg | ORAL_TABLET | Freq: Once | ORAL | Status: AC
  Administered 2022-10-25: 100 mg
  Filled 2022-10-25: qty 2

## 2022-10-25 MED ORDER — LIDOCAINE VISCOUS HCL 2 % MT SOLN
OROMUCOSAL | Status: AC
  Filled 2022-10-25: qty 15

## 2022-10-25 MED ORDER — DILTIAZEM HCL 60 MG PO TABS
60.0000 mg | ORAL_TABLET | Freq: Once | ORAL | Status: AC
  Administered 2022-10-25: 60 mg
  Filled 2022-10-25: qty 1

## 2022-10-25 NOTE — ED Triage Notes (Signed)
Patient BIB EMS from [facility] for G-tube replacement. VSS.

## 2022-10-25 NOTE — Discharge Instructions (Addendum)
It was our pleasure to provide your ER care today - we hope that you feel better.  Follow up with your doctor. Follow up for tube exchange in 6 months. Also follow up with your doctor regarding your blood pressure that is high today.   Return to ER if worse, new symptoms, fevers, new/severe pain, or other concern.

## 2022-10-25 NOTE — ED Provider Notes (Signed)
[place] EMERGENCY DEPARTMENT AT [facility] Provider Note   CSN: [id number] Arrival date & time: 10/25/22  1415     History  Chief Complaint  Patient presents with   G-tube replacement    Tara [name] is a 72 y.o. female.  Patient is a 72 year old female with a history of CKD, sarcoidosis, anemia, metabolic encephalopathy, with tardive dyskinesia and neuro sarcoidosis resulting in dysphagia requiring her to have a PEG tube that was placed in December who is presenting today due to her PEG tube being pulled out.  Patient has no other complaints at this time.  The history is provided by the patient and medical records.       Home Medications Prior to Admission medications   Medication Sig Start Date End Date Taking? Authorizing Provider  amoxicillin (AMOXIL) 500 MG tablet Take 2,000 mg by mouth once. Prior to dental appointments 08/03/19   [provider]  antiseptic oral rinse (BIOTENE) LIQD 15 mLs by Mouth Rinse route 3 (three) times daily as needed for dry mouth or mouth pain.    [provider]  apixaban (ELIQUIS) 5 MG TABS tablet Place 1 tablet (5 mg total) into feeding tube 2 (two) times daily. 07/03/22   Leroy Sea, MD  brimonidine-timolol (COMBIGAN) 0.2-0.5 % ophthalmic solution Place 1 drop into both eyes every 12 (twelve) hours. 12/02/19   [provider]  carboxymethylcellulose (REFRESH PLUS) 0.5 % SOLN Place 1 drop into both eyes 3 (three) times daily as needed (Dry eyes). RETAIN eye drops instead of refresh    [provider]  cholecalciferol (CHOLECALCIFEROL) 25 MCG tablet Place 1 tablet (1,000 Units total) into feeding tube daily. 07/04/22   Leroy Sea, MD  clorazepate (TRANXENE) 7.5 MG tablet Place 1 tablet (7.5 mg total) into feeding tube 2 (two) times daily. 07/03/22   Leroy Sea, MD  desvenlafaxine (PRISTIQ) 100 MG 24 hr tablet Place 1 tablet (100 mg total) into feeding tube at bedtime. 07/03/22    Leroy Sea, MD  diltiazem (CARDIZEM) 60 MG tablet Place 1 tablet (60 mg total) into feeding tube every 8 (eight) hours. 07/03/22   Leroy Sea, MD  ferrous sulfate 300 (60 Fe) MG/5ML syrup Place 5 mLs (300 mg total) into feeding tube daily with breakfast. 07/04/22   Leroy Sea, MD  flecainide (TAMBOCOR) 50 MG tablet Place 1 tablet (50 mg total) into feeding tube 2 (two) times daily. 07/03/22   Leroy Sea, MD  gabapentin (NEURONTIN) 250 MG/5ML solution Place 2 mLs (100 mg total) into feeding tube 3 (three) times daily. 07/03/22   Leroy Sea, MD  hydrALAZINE (APRESOLINE) 100 MG tablet Place 1 tablet (100 mg total) into feeding tube 3 (three) times daily. 07/03/22   Leroy Sea, MD  INGREZZA 40 MG capsule Take 40 mg by mouth daily. 03/14/22   [provider]  isosorbide dinitrate (ISORDIL) 10 MG tablet Place 1 tablet (10 mg total) into feeding tube 3 (three) times daily. 07/03/22   Leroy Sea, MD  latanoprost (XALATAN) 0.005 % ophthalmic solution Place 1 drop into both eyes at bedtime. 08/25/16   [provider]  melatonin 5 MG TABS Place 2 tablets (10 mg total) into feeding tube at bedtime. 07/03/22   Leroy Sea, MD  Multiple Vitamins-Minerals (MULTIVITAMIN WITH MINERALS) tablet Place 1 tablet into feeding tube daily. 07/03/22   Leroy Sea, MD  Nutritional Supplements (FEEDING SUPPLEMENT, OSMOLITE 1.5 CAL,) LIQD 60 mLs  by Per J Tube route continuous. 07/03/22   Leroy Sea, MD  Omega 3 1200 MG CAPS Place 2 capsules (2,400 mg total) into feeding tube at bedtime. 07/03/22   Leroy Sea, MD  oxyCODONE (OXY IR/ROXICODONE) 5 MG immediate release tablet Place 1 tablet (5 mg total) into feeding tube every 6 (six) hours as needed for moderate pain. 07/03/22   Leroy Sea, MD  pantoprazole sodium (PROTONIX) 40 mg Place 40 mg into feeding tube daily. 07/03/22   Leroy Sea, MD  polyethylene glycol (MIRALAX /  GLYCOLAX) 17 g packet Place 17 g into feeding tube 2 (two) times daily. 07/03/22   Leroy Sea, MD  senna (SENOKOT) 8.6 MG tablet Place 1 tablet (8.6 mg total) into feeding tube daily. 07/03/22   Leroy Sea, MD  Water For Irrigation, Sterile (FREE WATER) SOLN Place 250 mLs into feeding tube every 6 (six) hours. 07/03/22   Leroy Sea, MD      Allergies    Sulfa antibiotics and Fluoxetine hcl    Review of Systems   Review of Systems  Physical Exam Updated Vital Signs BP (!) 158/89 (BP Location: Right Arm)   Pulse 66   Temp 98.6 F (37 C) (Oral)   Resp 18   SpO2 100%  Physical Exam Vitals and nursing note reviewed.  Constitutional:      Appearance: She is ill-appearing.  HENT:     Mouth/Throat:     Mouth: Mucous membranes are dry.  Cardiovascular:     Rate and Rhythm: Normal rate.  Pulmonary:     Effort: Pulmonary effort is normal.  Abdominal:     Comments: Mild discomfort with palpation around the PEG tube site.  Small amount of blood present at the tube site.  Skin:    General: Skin is warm and dry.  Neurological:     Mental Status: She is alert. Mental status is at baseline.  Psychiatric:     Comments: Flat affect     ED Results / Procedures / Treatments   Labs (all labs ordered are listed, but only abnormal results are displayed) Labs Reviewed - No data to display  EKG None  Radiology No results found.  Procedures Procedures    Medications Ordered in ED Medications  sterile water (preservative free) injection (has no administration in time range)    ED Course/ Medical Decision Making/ A&P                             Medical Decision Making  Patient presenting today because her PEG tube was pulled out.  She is otherwise hemodynamically stable and appears to be at her baseline.  Unable to pass an 51 [place] tube but could place a 16 [place] Foley.  Unable to pass the 18 French PEG and no 16 French pegs are present.  Spoke with IR who  she will go over and they will pass a new tube with a wire.        Final Clinical Impression(s) / ED Diagnoses Final diagnoses:  None    Rx / DC Orders ED Discharge Orders     None         Gwyneth Sprout, MD 10/25/22 1557

## 2022-10-25 NOTE — ED Notes (Signed)
[name] NO ETA 4th out

## 2022-10-25 NOTE — Procedures (Signed)
Vascular and Interventional Radiology Procedure Note  Patient: Tara [name] DOB: Jun 13, 1951 Medical Record Number: [id number] Note Date/Time: 10/25/22 4:53 PM   Performing Physician: Roanna Banning, MD Assistant(s): None  Diagnosis: Catheter fell out.  Procedure: PERCUTANEOUS GASTROSTOMY TUBE REPLACEMENT  Anesthesia: Local Anesthetic Complications: None Estimated Blood Loss:  0 mL  Findings:  Successful exchange of a  4 F gastrostomy tube under fluoroscopy.   See detailed procedure note with images in PACS. The patient tolerated the procedure well without incident or complication and was returned to  ER  in stable condition.    Roanna Banning, MD [facility]   Pager. [phone] Clinic. [phone]

## 2022-10-25 NOTE — ED Notes (Signed)
PTAR present for pt transport at this time.

## 2022-10-25 NOTE — ED Provider Notes (Signed)
Pt signed out that pt can be d/c'd home after IR replacement of g tube.   G tube place by IR, no complications noted.   Vitals stable. Pt denies c/o or pain. No abd pain. No nv. Abd soft nt.   Pt appears stable for d/c.      Cathren Laine, MD 10/25/22 [phone]

## 2022-10-25 NOTE — ED Notes (Signed)
This RN called [employer] and spoke with Alcario Drought to give updated report of medications given and transport status.

## 2022-10-25 NOTE — ED Notes (Signed)
Pt received for care at 1925.  Quietly resting in bed with eyes closed; respirations even and unlabored.  Per report, pt awaiting [facility] transport which has already been arranged.  Bed in lowest position, wheels locked.  Call bell within reach.

## 2022-10-25 NOTE — ED Notes (Signed)
Pt's BP elevated at this time; pt also endorsing headache.  Due to need for G-tube replacement prior today, pt likely did not receive BP meds before arrival.  This RN reaching out to see if pt can have her nightly BP medications prior to [facility] transport.

## 2022-10-25 NOTE — ED Notes (Signed)
Pt being discharged with [facility] transport at this time.  AVS given to [facility].  Pt leaving ED in stable condition via stretcher with all belongings.

## 2022-10-25 NOTE — ED Notes (Signed)
This RN spoke with pt's Healthcare Advocate Victorino Dike) to provide update on transport status.

## 2022-10-26 DIAGNOSIS — G2401 Drug induced subacute dyskinesia: Secondary | ICD-10-CM | POA: Diagnosis not present

## 2022-10-26 DIAGNOSIS — R519 Headache, unspecified: Secondary | ICD-10-CM | POA: Diagnosis not present

## 2022-10-26 DIAGNOSIS — F32A Depression, unspecified: Secondary | ICD-10-CM | POA: Diagnosis not present

## 2022-10-26 DIAGNOSIS — R1311 Dysphagia, oral phase: Secondary | ICD-10-CM | POA: Diagnosis not present

## 2022-10-29 DIAGNOSIS — R1313 Dysphagia, pharyngeal phase: Secondary | ICD-10-CM | POA: Diagnosis not present

## 2022-10-29 DIAGNOSIS — R1312 Dysphagia, oropharyngeal phase: Secondary | ICD-10-CM | POA: Diagnosis not present

## 2022-10-29 DIAGNOSIS — D869 Sarcoidosis, unspecified: Secondary | ICD-10-CM | POA: Diagnosis not present

## 2022-10-29 DIAGNOSIS — Z931 Gastrostomy status: Secondary | ICD-10-CM | POA: Diagnosis not present

## 2022-10-31 DIAGNOSIS — R1312 Dysphagia, oropharyngeal phase: Secondary | ICD-10-CM | POA: Diagnosis not present

## 2022-10-31 DIAGNOSIS — G2401 Drug induced subacute dyskinesia: Secondary | ICD-10-CM | POA: Diagnosis not present

## 2022-10-31 DIAGNOSIS — R2681 Unsteadiness on feet: Secondary | ICD-10-CM | POA: Diagnosis not present

## 2022-10-31 DIAGNOSIS — I480 Paroxysmal atrial fibrillation: Secondary | ICD-10-CM | POA: Diagnosis not present

## 2022-10-31 DIAGNOSIS — M6281 Muscle weakness (generalized): Secondary | ICD-10-CM | POA: Diagnosis not present

## 2022-10-31 DIAGNOSIS — R1311 Dysphagia, oral phase: Secondary | ICD-10-CM | POA: Diagnosis not present

## 2022-10-31 DIAGNOSIS — F32A Depression, unspecified: Secondary | ICD-10-CM | POA: Diagnosis not present

## 2022-10-31 DIAGNOSIS — J690 Pneumonitis due to inhalation of food and vomit: Secondary | ICD-10-CM | POA: Diagnosis not present

## 2022-11-05 DIAGNOSIS — M1990 Unspecified osteoarthritis, unspecified site: Secondary | ICD-10-CM | POA: Diagnosis not present

## 2022-11-05 DIAGNOSIS — R41841 Cognitive communication deficit: Secondary | ICD-10-CM | POA: Diagnosis not present

## 2022-11-05 DIAGNOSIS — D509 Iron deficiency anemia, unspecified: Secondary | ICD-10-CM | POA: Diagnosis not present

## 2022-11-05 DIAGNOSIS — R1312 Dysphagia, oropharyngeal phase: Secondary | ICD-10-CM | POA: Diagnosis not present

## 2022-11-05 DIAGNOSIS — N1831 Chronic kidney disease, stage 3a: Secondary | ICD-10-CM | POA: Diagnosis not present

## 2022-11-05 DIAGNOSIS — Z431 Encounter for attention to gastrostomy: Secondary | ICD-10-CM | POA: Diagnosis not present

## 2022-11-05 DIAGNOSIS — F411 Generalized anxiety disorder: Secondary | ICD-10-CM | POA: Diagnosis not present

## 2022-11-05 DIAGNOSIS — G4733 Obstructive sleep apnea (adult) (pediatric): Secondary | ICD-10-CM | POA: Diagnosis not present

## 2022-11-05 DIAGNOSIS — D869 Sarcoidosis, unspecified: Secondary | ICD-10-CM | POA: Diagnosis not present

## 2022-11-05 DIAGNOSIS — I129 Hypertensive chronic kidney disease with stage 1 through stage 4 chronic kidney disease, or unspecified chronic kidney disease: Secondary | ICD-10-CM | POA: Diagnosis not present

## 2022-11-05 DIAGNOSIS — J690 Pneumonitis due to inhalation of food and vomit: Secondary | ICD-10-CM | POA: Diagnosis not present

## 2022-11-05 DIAGNOSIS — I480 Paroxysmal atrial fibrillation: Secondary | ICD-10-CM | POA: Diagnosis not present

## 2022-11-05 DIAGNOSIS — G2401 Drug induced subacute dyskinesia: Secondary | ICD-10-CM | POA: Diagnosis not present

## 2022-11-05 DIAGNOSIS — F330 Major depressive disorder, recurrent, mild: Secondary | ICD-10-CM | POA: Diagnosis not present

## 2022-11-05 DIAGNOSIS — J9691 Respiratory failure, unspecified with hypoxia: Secondary | ICD-10-CM | POA: Diagnosis not present

## 2022-11-06 ENCOUNTER — Ambulatory Visit (INDEPENDENT_AMBULATORY_CARE_PROVIDER_SITE_OTHER): Payer: BC Managed Care – PPO | Admitting: Internal Medicine

## 2022-11-06 ENCOUNTER — Encounter: Payer: Self-pay | Admitting: Internal Medicine

## 2022-11-06 VITALS — BP 126/78 | HR 94 | Temp 98.2°F | Ht 66.0 in | Wt 185.0 lb

## 2022-11-06 DIAGNOSIS — R519 Headache, unspecified: Secondary | ICD-10-CM

## 2022-11-06 DIAGNOSIS — F320 Major depressive disorder, single episode, mild: Secondary | ICD-10-CM | POA: Diagnosis not present

## 2022-11-06 DIAGNOSIS — E538 Deficiency of other specified B group vitamins: Secondary | ICD-10-CM

## 2022-11-06 DIAGNOSIS — N184 Chronic kidney disease, stage 4 (severe): Secondary | ICD-10-CM

## 2022-11-06 DIAGNOSIS — E559 Vitamin D deficiency, unspecified: Secondary | ICD-10-CM | POA: Diagnosis not present

## 2022-11-06 DIAGNOSIS — I872 Venous insufficiency (chronic) (peripheral): Secondary | ICD-10-CM

## 2022-11-06 DIAGNOSIS — T380X5D Adverse effect of glucocorticoids and synthetic analogues, subsequent encounter: Secondary | ICD-10-CM | POA: Diagnosis not present

## 2022-11-06 DIAGNOSIS — R8290 Unspecified abnormal findings in urine: Secondary | ICD-10-CM

## 2022-11-06 DIAGNOSIS — D509 Iron deficiency anemia, unspecified: Secondary | ICD-10-CM | POA: Diagnosis not present

## 2022-11-06 DIAGNOSIS — G2401 Drug induced subacute dyskinesia: Secondary | ICD-10-CM | POA: Diagnosis not present

## 2022-11-06 DIAGNOSIS — J9691 Respiratory failure, unspecified with hypoxia: Secondary | ICD-10-CM | POA: Diagnosis not present

## 2022-11-06 DIAGNOSIS — I10 Essential (primary) hypertension: Secondary | ICD-10-CM

## 2022-11-06 DIAGNOSIS — R1312 Dysphagia, oropharyngeal phase: Secondary | ICD-10-CM | POA: Diagnosis not present

## 2022-11-06 DIAGNOSIS — E099 Drug or chemical induced diabetes mellitus without complications: Secondary | ICD-10-CM

## 2022-11-06 DIAGNOSIS — Z431 Encounter for attention to gastrostomy: Secondary | ICD-10-CM | POA: Diagnosis not present

## 2022-11-06 DIAGNOSIS — Z0001 Encounter for general adult medical examination with abnormal findings: Secondary | ICD-10-CM

## 2022-11-06 DIAGNOSIS — E7800 Pure hypercholesterolemia, unspecified: Secondary | ICD-10-CM

## 2022-11-06 DIAGNOSIS — J690 Pneumonitis due to inhalation of food and vomit: Secondary | ICD-10-CM | POA: Diagnosis not present

## 2022-11-06 DIAGNOSIS — M1990 Unspecified osteoarthritis, unspecified site: Secondary | ICD-10-CM | POA: Diagnosis not present

## 2022-11-06 DIAGNOSIS — I480 Paroxysmal atrial fibrillation: Secondary | ICD-10-CM | POA: Diagnosis not present

## 2022-11-06 DIAGNOSIS — G4733 Obstructive sleep apnea (adult) (pediatric): Secondary | ICD-10-CM | POA: Diagnosis not present

## 2022-11-06 DIAGNOSIS — G8929 Other chronic pain: Secondary | ICD-10-CM

## 2022-11-06 DIAGNOSIS — R41841 Cognitive communication deficit: Secondary | ICD-10-CM | POA: Diagnosis not present

## 2022-11-06 DIAGNOSIS — F330 Major depressive disorder, recurrent, mild: Secondary | ICD-10-CM | POA: Diagnosis not present

## 2022-11-06 DIAGNOSIS — N1831 Chronic kidney disease, stage 3a: Secondary | ICD-10-CM | POA: Diagnosis not present

## 2022-11-06 DIAGNOSIS — F411 Generalized anxiety disorder: Secondary | ICD-10-CM | POA: Diagnosis not present

## 2022-11-06 DIAGNOSIS — D869 Sarcoidosis, unspecified: Secondary | ICD-10-CM | POA: Diagnosis not present

## 2022-11-06 DIAGNOSIS — I129 Hypertensive chronic kidney disease with stage 1 through stage 4 chronic kidney disease, or unspecified chronic kidney disease: Secondary | ICD-10-CM | POA: Diagnosis not present

## 2022-11-06 LAB — BASIC METABOLIC PANEL
BUN: 27 mg/dL — ABNORMAL HIGH (ref 6–23)
CO2: 30 mEq/L (ref 19–32)
Calcium: 10.5 mg/dL (ref 8.4–10.5)
Chloride: 99 mEq/L (ref 96–112)
Creatinine, Ser: 0.93 mg/dL (ref 0.40–1.20)
GFR: 62.53 mL/min (ref >60.00)
Glucose, Bld: 113 mg/dL — ABNORMAL HIGH (ref 70–99)
Potassium: 4.3 mEq/L (ref 3.5–5.1)
Sodium: 138 mEq/L (ref 135–145)

## 2022-11-06 LAB — LIPID PANEL
Cholesterol: 174 mg/dL (ref 0–200)
HDL: 39 mg/dL — ABNORMAL LOW (ref >39.00)
NonHDL: 135.39
Total CHOL/HDL Ratio: 4
Triglycerides: 224 mg/dL — ABNORMAL HIGH (ref 0.0–149.0)
VLDL: 44.8 mg/dL — ABNORMAL HIGH (ref 0.0–40.0)

## 2022-11-06 LAB — CBC WITH DIFFERENTIAL/PLATELET
Basophils Absolute: 0 10*3/uL (ref 0.0–0.1)
Basophils Relative: 0.4 % (ref 0.0–3.0)
Eosinophils Absolute: 0 10*3/uL (ref 0.0–0.7)
Eosinophils Relative: 0.1 % (ref 0.0–5.0)
HCT: 42.8 % (ref 36.0–46.0)
Hemoglobin: 14.7 g/dL (ref 12.0–15.0)
Lymphocytes Relative: 17.1 % (ref 12.0–46.0)
Lymphs Abs: 1.6 10*3/uL (ref 0.7–4.0)
MCHC: 34.3 g/dL (ref 30.0–36.0)
MCV: 85 fl (ref 78.0–100.0)
Monocytes Absolute: 0.9 10*3/uL (ref 0.1–1.0)
Monocytes Relative: 10 % (ref 3.0–12.0)
Neutro Abs: 6.6 10*3/uL (ref 1.4–7.7)
Neutrophils Relative %: 72.4 % (ref 43.0–77.0)
Platelets: 269 10*3/uL (ref 150.0–400.0)
RBC: 5.03 Mil/uL (ref 3.87–5.11)
RDW: 17 % — ABNORMAL HIGH (ref 11.5–15.5)
WBC: 9.1 10*3/uL (ref 4.0–10.5)

## 2022-11-06 LAB — HEPATIC FUNCTION PANEL
ALT: 36 U/L — ABNORMAL HIGH (ref 0–35)
AST: 27 U/L (ref 0–37)
Albumin: 4.2 g/dL (ref 3.5–5.2)
Alkaline Phosphatase: 120 U/L — ABNORMAL HIGH (ref 39–117)
Bilirubin, Direct: 0.2 mg/dL (ref 0.0–0.3)
Total Bilirubin: 0.8 mg/dL (ref 0.2–1.2)
Total Protein: 7.2 g/dL (ref 6.0–8.3)

## 2022-11-06 LAB — LDL CHOLESTEROL, DIRECT: Direct LDL: 64 mg/dL

## 2022-11-06 LAB — VITAMIN D 25 HYDROXY (VIT D DEFICIENCY, FRACTURES): VITD: 68.07 ng/mL (ref 30.00–100.00)

## 2022-11-06 LAB — HEMOGLOBIN A1C: Hgb A1c MFr Bld: 5.7 % (ref 4.6–6.5)

## 2022-11-06 LAB — TSH: TSH: 3.69 u[IU]/mL (ref 0.35–5.50)

## 2022-11-06 LAB — VITAMIN B12: Vitamin B-12: 896 pg/mL (ref 211–911)

## 2022-11-06 MED ORDER — DESVENLAFAXINE SUCCINATE ER 100 MG PO TB24: 100.0000 mg | ORAL_TABLET | Freq: Every day | ORAL | 3 refills | Status: DC

## 2022-11-06 MED ORDER — VALBENAZINE TOSYLATE 60 MG PO CAPS: 60.0000 mg | ORAL_CAPSULE | Freq: Every day | ORAL | 11 refills | Status: DC

## 2022-11-06 MED ORDER — GABAPENTIN 100 MG PO CAPS: 100.0000 mg | ORAL_CAPSULE | Freq: Three times a day (TID) | ORAL | 5 refills | Status: DC

## 2022-11-06 MED ORDER — FLUTICASONE PROPIONATE 50 MCG/ACT NA SUSP: 2.0000 | Freq: Every day | NASAL | 6 refills | Status: DC

## 2022-11-06 NOTE — Progress Notes (Signed)
The test results show that your current treatment is OK, as the tests are stable.  Please continue the same plan.  There is no other need for change of treatment or further evaluation based on these results, at this time.  thanks 

## 2022-11-06 NOTE — Assessment & Plan Note (Addendum)
Chronic persistent, For neurology f/u next monday

## 2022-11-06 NOTE — Progress Notes (Signed)
Patient ID: Tara [name], female   DOB: 03-18-1951, 72 y.o.   MRN: [id number]         Chief Complaint:: wellness exam and Medical Management of Chronic Issues (Rehab release visit , would like to get her measured for compression socks , would like a prescription for oxygen at home , headache medication isn't working would like a higher dosage , some meds she wasn't taking fot the last 8 months wants to know if she should still be taking )        HPI:  Tara [name] is a 72 y.o. female here for wellness exam with new private live-in home aide to assist after recent prolonged hospn and rehab total about 6 months. Home now x 2 days, and assistant wants to ensure accurate med list.  Declines Gyn for  pap smear, declines eye exam referral for now; for shingrix at the pharmacy, declines covid booster.                           Also assistant reports increase urine odor, not clear if can give specimen today but will try.  Pt denies chest pain, increased sob or doe, wheezing, orthopnea, PND, palpitations, dizziness or syncope except has recurring mild distal LE swelling better first thing in the am, worse in the pm after sitting up.  Also has chronic HA daily, has not been able to continue fu with neurology in many months.  Has ongoing chronic mild to mod depressive symptoms, but no suicidal ideation, or panic; has f/u psychiatry appt in aug 2024.     Despite her 6 month ordeal pt still has significant mobility issue especially with sitting to standing.  To start PT soon, currently unable to stand from sitting without assist - will need lazy boy type lift chair to assist with standing up from sitting, due to low power in the upper legs.     Wt Readings from Last 3 Encounters:  11/06/22 185 lb (83.9 kg)  08/30/22 179 lb (81.2 kg)  07/03/22 181 lb 10.5 oz (82.4 kg)   BP Readings from Last 3 Encounters:  11/06/22 126/78  10/25/22 (!) 159/91  08/30/22 139/80   Immunization History  Administered Date(s)  Administered   Fluad Quad(high Dose 65+) 03/11/2019, 05/27/2020, 04/11/2021   Influenza Split 03/29/2013   Influenza Whole 05/09/1999, 05/19/2007, 04/01/2008, 04/21/2009, 03/26/2011   Influenza, High Dose Seasonal PF 03/27/2018, 05/25/2019   Influenza,inj,Quad PF,6+ Mos 03/25/2014, 03/27/2016, 06/13/2017   Influenza-Unspecified 04/15/2012, 04/14/2015   Moderna Sars-Covid-2 Vaccination 08/28/2019, 09/25/2019, 11/30/2019, 05/27/2020, 11/25/2020   Pneumococcal Conjugate-13 03/18/2020   Pneumococcal Polysaccharide-23 06/05/2013, 10/10/2021   Td 07/16/1992, 10/21/2008   Tdap 03/18/2020   Zoster, Live 12/14/2012   Health Maintenance Due  Topic Date Due   PAP SMEAR-Modifier  05/16/2021   Diabetic kidney evaluation - Urine ACR  10/06/2021      Past Medical History:  Diagnosis Date   Anemia    on meds   ANKLE PAIN, LEFT 04/01/2008   ANXIETY 04/17/2007   on meds   Arthritis    generalized   Cataract    bilateral sx   Chronic kidney disease    stage 3   Colon polyp    COLONIC POLYPS, HX OF 08/01/2007   CONTUSIONS, MULTIPLE 04/01/2009   DEPRESSION 04/17/2007   on meds   DIZZINESS 08/01/2007   DYSPNEA 08/01/2007   with exertion   Enlargement of lymph nodes 08/13/2007   Excessive involuntary  blinking    per pt,going on since 2018   Glaucoma    on meds   GLUCOSE INTOLERANCE 08/01/2007   Hypercalcemia due to sarcoidosis 2014   HYPERLIPIDEMIA 08/01/2007   no meds   HYPERSOMNIA 07/28/2008   HYPERTENSION 04/17/2007   Impaired glucose tolerance 03/23/2011   JOINT EFFUSION, LEFT KNEE 06/02/2010   Loose body in knee 04/01/2009   pt not sure?   Metabolic encephalopathy 12/09/2015-12/2015   Migraines    "stopped 3-4 yr ago" (07/23/2012)   Morbid obesity (HCC) 04/20/2007   OTHER DISEASES OF LUNG NOT ELSEWHERE CLASSIFIED 08/01/2007   Pain in joint, lower leg 04/01/2009   PERIPHERAL EDEMA 04/21/2009   Pre-diabetes    Sarcoidosis 09/25/2007   Seasonal allergies    SHOULDER PAIN,  LEFT 04/01/2009   Sleep apnea    does not use Cpap   Past Surgical History:  Procedure Laterality Date   BREAST SURGERY     Biopsy benign/bil breaST   CARDIOVERSION N/A 12/15/2021   Procedure: CARDIOVERSION;  Surgeon: Chilton Si, MD;  Location: [facility] ENDOSCOPY;  Service: Cardiovascular;  Laterality: N/A;   CARDIOVERSION N/A 02/09/2022   Procedure: CARDIOVERSION;  Surgeon: Chrystie Nose, MD;  Location: [facility] ENDOSCOPY;  Service: Cardiovascular;  Laterality: N/A;   CATARACT EXTRACTION     RIGHT EYE   COLONOSCOPY  2020   KN-MAC-suprep(good)-tics/TA's   COMBINED MEDIASTINOSCOPY AND BRONCHOSCOPY  08/2007   COMBINED MEDIASTINOSCOPY AND BRONCHOSCOPY  2009   Dental implant     FRACTURE SURGERY  ?02/1997   "left upper arm; put rod in" (07/23/2012)   GUM SURGERY  2000-?2009   "several ORs; soft tissue graft; took material from roof of mouth" (07/23/2012   HUMERUS SURGERY Left 1998   rod insertion   IR GASTROSTOMY TUBE MOD SED  06/22/2022   IR REPLC GASTRO/COLONIC TUBE PERCUT W/FLUORO  10/25/2022   KNEE ARTHROSCOPY  03/2004; 06/2009   "right; left" Dr. Thomasena Edis   KNEE SURGERY  2012   ARTHROSCOPIC LEFT KNEE   LYMPH NODE BIOPSY  ~ 2009   "for sarcoidosis; don't know exactly which nodes" (07/23/2102)   MYOMECTOMY  1994   Open   POLYPECTOMY  2020   TA's   REFRACTIVE SURGERY  08/1998   "both eyes" (07/23/2012)   REFRACTIVE SURGERY  2000   Bil   TOTAL KNEE ARTHROPLASTY Left 09/24/2016   Procedure: TOTAL KNEE ARTHROPLASTY;  Surgeon: Jodi Geralds, MD;  Location: [facility] OR;  Service: Orthopedics;  Laterality: Left;    reports that she has never smoked. She has never used smokeless tobacco. She reports current alcohol use of about 1.0 - 2.0 standard drink of alcohol per week. She reports that she does not use drugs. family history includes Asthma in her sister; Diabetes in her mother; Gout in her sister; Heart disease (age of onset: 87) in her father; Hypertension in her father, mother, and sister; Stroke in  her mother. Allergies  Allergen Reactions   Sulfa Antibiotics Rash   Fluoxetine Hcl Other (See Comments)    (PROZAC) Suicidal thoughts   Current Outpatient Medications on File Prior to Visit  Medication Sig Dispense Refill   antiseptic oral rinse (BIOTENE) LIQD 15 mLs by Mouth Rinse route 3 (three) times daily as needed for dry mouth or mouth pain.     apixaban (ELIQUIS) 5 MG TABS tablet Place 1 tablet (5 mg total) into feeding tube 2 (two) times daily. 180 tablet 1   brimonidine-timolol (COMBIGAN) 0.2-0.5 % ophthalmic solution Place 1 drop into  both eyes every 12 (twelve) hours.     carboxymethylcellulose (REFRESH PLUS) 0.5 % SOLN Place 1 drop into both eyes 3 (three) times daily as needed (Dry eyes). RETAIN eye drops instead of refresh     cholecalciferol (CHOLECALCIFEROL) 25 MCG tablet Place 1 tablet (1,000 Units total) into feeding tube daily.     clorazepate (TRANXENE) 7.5 MG tablet Place 1 tablet (7.5 mg total) into feeding tube 2 (two) times daily. 4 tablet 0   diltiazem (CARDIZEM) 60 MG tablet Place 1 tablet (60 mg total) into feeding tube every 8 (eight) hours.     ferrous sulfate 300 (60 Fe) MG/5ML syrup Place 5 mLs (300 mg total) into feeding tube daily with breakfast. 150 mL 3   flecainide (TAMBOCOR) 50 MG tablet Place 1 tablet (50 mg total) into feeding tube 2 (two) times daily.     hydrALAZINE (APRESOLINE) 100 MG tablet Place 1 tablet (100 mg total) into feeding tube 3 (three) times daily.     isosorbide dinitrate (ISORDIL) 10 MG tablet Place 1 tablet (10 mg total) into feeding tube 3 (three) times daily.     latanoprost (XALATAN) 0.005 % ophthalmic solution Place 1 drop into both eyes at bedtime.     melatonin 5 MG TABS Place 2 tablets (10 mg total) into feeding tube at bedtime.  0   Multiple Vitamins-Minerals (MULTIVITAMIN WITH MINERALS) tablet Place 1 tablet into feeding tube daily.     Nutritional Supplements (FEEDING SUPPLEMENT, OSMOLITE 1.5 CAL,) LIQD 60 mLs by Per J Tube  route continuous.     Omega 3 1200 MG CAPS Place 2 capsules (2,400 mg total) into feeding tube at bedtime.     polyethylene glycol (MIRALAX / GLYCOLAX) 17 g packet Place 17 g into feeding tube 2 (two) times daily. 14 each 0   senna (SENOKOT) 8.6 MG tablet Place 1 tablet (8.6 mg total) into feeding tube daily.     Water For Irrigation, Sterile (FREE WATER) SOLN Place 250 mLs into feeding tube every 6 (six) hours.     No current facility-administered medications on file prior to visit.        ROS:  All others reviewed and negative.  Objective        PE:  BP 126/78 (BP Location: Left Arm, Patient Position: Sitting, Cuff Size: Normal)   Pulse 94   Temp 98.2 F (36.8 C) (Oral)   Ht 5\' 6"  (1.676 m)   Wt 185 lb (83.9 kg)   SpO2 98%   BMI 29.86 kg/m                 Constitutional: Pt appears in NAD               HENT: Head: NCAT.                Right Ear: External ear normal.                 Left Ear: External ear normal.                Eyes: . Pupils are equal, round, and reactive to light. Conjunctivae and EOM are normal               Nose: without d/c or deformity               Neck: Neck supple. Gross normal ROM               Cardiovascular: Normal rate and  regular rhythm.                 Pulmonary/Chest: Effort normal and breath sounds without rales or wheezing.                Abd:  Soft, NT, ND, + BS, no organomegaly               Neurological: Pt is alert. At baseline orientation, motor grossly intact               Skin: Skin is warm. No rashes, no other new lesions, LE edema - trace bilateral               Psychiatric: Pt behavior is normal without agitation , depressed affect  Micro: none  Cardiac tracings I have personally interpreted today:  none  Pertinent Radiological findings (summarize): none   Lab Results  Component Value Date   WBC 9.1 11/06/2022   HGB 14.7 11/06/2022   HCT 42.8 11/06/2022   PLT 269.0 11/06/2022   GLUCOSE 113 (H) 11/06/2022   CHOL 174  11/06/2022   TRIG 224.0 (H) 11/06/2022   HDL 39.00 (L) 11/06/2022   LDLDIRECT 64.0 11/06/2022   LDLCALC 43 10/10/2021   ALT 36 (H) 11/06/2022   AST 27 11/06/2022   NA 138 11/06/2022   K 4.3 11/06/2022   CL 99 11/06/2022   CREATININE 0.93 11/06/2022   BUN 27 (H) 11/06/2022   CO2 30 11/06/2022   TSH 3.69 11/06/2022   INR 1.2 06/22/2022   HGBA1C 5.7 11/06/2022   MICROALBUR 1.0 10/06/2020   Assessment/Plan:  Sherie Dobrowolski is a 72 y.o. White or Caucasian [1] female with  has a past medical history of Anemia, ANKLE PAIN, LEFT (04/01/2008), ANXIETY (04/17/2007), Arthritis, Cataract, Chronic kidney disease, Colon polyp, COLONIC POLYPS, HX OF (08/01/2007), CONTUSIONS, MULTIPLE (04/01/2009), DEPRESSION (04/17/2007), DIZZINESS (08/01/2007), DYSPNEA (08/01/2007), Enlargement of lymph nodes (08/13/2007), Excessive involuntary blinking, Glaucoma, GLUCOSE INTOLERANCE (08/01/2007), Hypercalcemia due to sarcoidosis (2014), HYPERLIPIDEMIA (08/01/2007), HYPERSOMNIA (07/28/2008), HYPERTENSION (04/17/2007), Impaired glucose tolerance (03/23/2011), JOINT EFFUSION, LEFT KNEE (06/02/2010), Loose body in knee (04/01/2009), Metabolic encephalopathy (12/09/2015-12/2015), Migraines, Morbid obesity (HCC) (04/20/2007), OTHER DISEASES OF LUNG NOT ELSEWHERE CLASSIFIED (08/01/2007), Pain in joint, lower leg (04/01/2009), PERIPHERAL EDEMA (04/21/2009), Pre-diabetes, Sarcoidosis (09/25/2007), Seasonal allergies, SHOULDER PAIN, LEFT (04/01/2009), and Sleep apnea.  Tardive dyskinesia Chronic persistent, For neurology f/u next monday  Encounter for well adult exam with abnormal findings Age and sex appropriate education and counseling updated with regular exercise and diet Referrals for preventative services - declines GYN referral Immunizations addressed - for shingrix at pharmacy Smoking counseling  - none needed Evidence for depression or other mood disorder - chronic resistant depression Most recent labs reviewed. I  have personally reviewed and have noted: 1) the patient's medical and social history 2) The patient's current medications and supplements 3) The patient's height, weight, and BMI have been recorded in the chart   Abnormal urine odor Exam benign, but will need ua and culture if able to give specimen today  CKD (chronic kidney disease) stage 4, GFR 15-29 ml/min (HCC) Lab Results  Component Value Date   CREATININE 0.93 11/06/2022   Stable overall, cont to avoid nephrotoxins   Depression Chronic persistent , no HI or SI, to continue pristiq fo rnow, for f/u psychiatry as planned, declines other change today  Essential hypertension BP Readings from Last 3 Encounters:  11/06/22 126/78  10/25/22 (!) 159/91  08/30/22 139/80   Stable, pt to continue medical treatment  card 60 mg tid hydralazine 100 tid   Hyperlipidemia Lab Results  Component Value Date   LDLCALC 43 10/10/2021   Stable, pt to continue current statin  - diet,wt control, for f/u lab    Steroid-induced diabetes Lab Results  Component Value Date   HGBA1C 5.7 11/06/2022   Stable, pt to continue current medical treatment - diet, wt control  Venous insufficiency Uncontrolled with recent mild worsening, for compression stockings daytime only  Chronic headaches Persistent uncontrolled, Ok for trial nurtec 75 qod until seen per neurology next wk  Followup: No follow-ups on file.  Oliver Barre, MD 11/09/2022 3:54 PM [facility] Internal Medicine

## 2022-11-06 NOTE — Patient Instructions (Signed)
Please remember to call for your yearly eye exam  Ok for stronger compression stocking to knees if able to get them on  Please take all new medication as prescribed - the nurtec samples if we have them  Please call for referral to HA clinic if this is not helpful and if neurology on Monday does not discuss this  Please continue all other medications as before, and refills have been done if requested.  Please have the pharmacy call with any other refills you may need.  Please continue your efforts at being more active, low cholesterol diet, and weight control.  You are otherwise up to date with prevention measures today.  Please keep your appointments with your specialists as you may have planned  Please go to the LAB at the blood drawing area for the tests to be done  You will be contacted by phone if any changes need to be made immediately.  Otherwise, you will receive a letter about your results with an explanation, but please check with MyChart first.  \Please remember to sign up for MyChart if you have not done so, as this will be important to you in the future with finding out test results, communicating by private email, and scheduling acute appointments online when needed.  Please make an Appointment to return in 4 months, or sooner if needed

## 2022-11-07 ENCOUNTER — Telehealth: Payer: Self-pay | Admitting: Internal Medicine

## 2022-11-07 NOTE — Telephone Encounter (Signed)
Ok for verbal orders ?

## 2022-11-07 NOTE — Telephone Encounter (Signed)
HH ORDERS   Caller Name: [facility] Agency Name: [facility] Callback Phone #: [phone](secure)  Service Requested: PT (examples: OT/PT/Skilled Nursing/Social Work/Speech Therapy/Wound Care)  Frequency of Visits: 1X a week for 4 weeks, 2X a week for 4 weeks

## 2022-11-07 NOTE — Telephone Encounter (Signed)
Called and left voicemail for verbal orders.  

## 2022-11-08 ENCOUNTER — Telehealth: Payer: Self-pay

## 2022-11-08 DIAGNOSIS — R1312 Dysphagia, oropharyngeal phase: Secondary | ICD-10-CM | POA: Diagnosis not present

## 2022-11-08 DIAGNOSIS — I129 Hypertensive chronic kidney disease with stage 1 through stage 4 chronic kidney disease, or unspecified chronic kidney disease: Secondary | ICD-10-CM | POA: Diagnosis not present

## 2022-11-08 DIAGNOSIS — F330 Major depressive disorder, recurrent, mild: Secondary | ICD-10-CM | POA: Diagnosis not present

## 2022-11-08 DIAGNOSIS — Z431 Encounter for attention to gastrostomy: Secondary | ICD-10-CM | POA: Diagnosis not present

## 2022-11-08 DIAGNOSIS — N1831 Chronic kidney disease, stage 3a: Secondary | ICD-10-CM | POA: Diagnosis not present

## 2022-11-08 DIAGNOSIS — G4733 Obstructive sleep apnea (adult) (pediatric): Secondary | ICD-10-CM | POA: Diagnosis not present

## 2022-11-08 DIAGNOSIS — I480 Paroxysmal atrial fibrillation: Secondary | ICD-10-CM | POA: Diagnosis not present

## 2022-11-08 DIAGNOSIS — R41841 Cognitive communication deficit: Secondary | ICD-10-CM | POA: Diagnosis not present

## 2022-11-08 DIAGNOSIS — D869 Sarcoidosis, unspecified: Secondary | ICD-10-CM | POA: Diagnosis not present

## 2022-11-08 DIAGNOSIS — M1990 Unspecified osteoarthritis, unspecified site: Secondary | ICD-10-CM | POA: Diagnosis not present

## 2022-11-08 DIAGNOSIS — D509 Iron deficiency anemia, unspecified: Secondary | ICD-10-CM | POA: Diagnosis not present

## 2022-11-08 DIAGNOSIS — J690 Pneumonitis due to inhalation of food and vomit: Secondary | ICD-10-CM | POA: Diagnosis not present

## 2022-11-08 DIAGNOSIS — F411 Generalized anxiety disorder: Secondary | ICD-10-CM | POA: Diagnosis not present

## 2022-11-08 DIAGNOSIS — J9691 Respiratory failure, unspecified with hypoxia: Secondary | ICD-10-CM | POA: Diagnosis not present

## 2022-11-08 DIAGNOSIS — G2401 Drug induced subacute dyskinesia: Secondary | ICD-10-CM | POA: Diagnosis not present

## 2022-11-08 DIAGNOSIS — T8189XA Other complications of procedures, not elsewhere classified, initial encounter: Secondary | ICD-10-CM | POA: Diagnosis not present

## 2022-11-08 MED ORDER — OMEPRAZOLE 40 MG PO CPDR
40.0000 mg | DELAYED_RELEASE_CAPSULE | Freq: Every day | ORAL | 3 refills | Status: DC
Start: 2022-11-08 — End: 2022-11-09

## 2022-11-08 NOTE — Telephone Encounter (Signed)
Med is not on med list. Will have to forward to MD to address when he return.Marland KitchenRaechel Chute

## 2022-11-08 NOTE — Telephone Encounter (Signed)
Stephanie with [facility] is asking for verbals of OT 1x3w and every other week for  3 visits.    Judeth Cornfield can be reached at [phone].

## 2022-11-08 NOTE — Telephone Encounter (Signed)
Pt is needing a rx for Omeprazole  sent in to her pharmacy as she has been on this medication for the past 8 months. The facility she was in provider is who put the pt on the medication. Pts nurse is asking that Dr. Jonny Ruiz plz send in a rx for the above medication.  **Pts nurse was told to reach out to pts PCP for the rx.  Please call Janine Limbo at [phone] with an update.

## 2022-11-08 NOTE — Telephone Encounter (Signed)
MD is out of the office pls advise../lb 

## 2022-11-08 NOTE — Addendum Note (Signed)
Addended by: Corwin Levins on: 11/08/2022 05:36 PM   Modules accepted: Orders

## 2022-11-08 NOTE — Telephone Encounter (Signed)
Ok done to [facility]

## 2022-11-09 ENCOUNTER — Telehealth: Payer: Self-pay

## 2022-11-09 ENCOUNTER — Other Ambulatory Visit: Payer: Self-pay

## 2022-11-09 DIAGNOSIS — N1831 Chronic kidney disease, stage 3a: Secondary | ICD-10-CM | POA: Diagnosis not present

## 2022-11-09 DIAGNOSIS — G2401 Drug induced subacute dyskinesia: Secondary | ICD-10-CM | POA: Diagnosis not present

## 2022-11-09 DIAGNOSIS — G4733 Obstructive sleep apnea (adult) (pediatric): Secondary | ICD-10-CM | POA: Diagnosis not present

## 2022-11-09 DIAGNOSIS — I129 Hypertensive chronic kidney disease with stage 1 through stage 4 chronic kidney disease, or unspecified chronic kidney disease: Secondary | ICD-10-CM | POA: Diagnosis not present

## 2022-11-09 DIAGNOSIS — I872 Venous insufficiency (chronic) (peripheral): Secondary | ICD-10-CM | POA: Insufficient documentation

## 2022-11-09 DIAGNOSIS — D509 Iron deficiency anemia, unspecified: Secondary | ICD-10-CM | POA: Diagnosis not present

## 2022-11-09 DIAGNOSIS — M1990 Unspecified osteoarthritis, unspecified site: Secondary | ICD-10-CM | POA: Diagnosis not present

## 2022-11-09 DIAGNOSIS — I480 Paroxysmal atrial fibrillation: Secondary | ICD-10-CM | POA: Diagnosis not present

## 2022-11-09 DIAGNOSIS — J690 Pneumonitis due to inhalation of food and vomit: Secondary | ICD-10-CM | POA: Diagnosis not present

## 2022-11-09 DIAGNOSIS — J9691 Respiratory failure, unspecified with hypoxia: Secondary | ICD-10-CM | POA: Diagnosis not present

## 2022-11-09 DIAGNOSIS — R1312 Dysphagia, oropharyngeal phase: Secondary | ICD-10-CM | POA: Diagnosis not present

## 2022-11-09 DIAGNOSIS — D869 Sarcoidosis, unspecified: Secondary | ICD-10-CM | POA: Diagnosis not present

## 2022-11-09 DIAGNOSIS — F330 Major depressive disorder, recurrent, mild: Secondary | ICD-10-CM | POA: Diagnosis not present

## 2022-11-09 DIAGNOSIS — F411 Generalized anxiety disorder: Secondary | ICD-10-CM | POA: Diagnosis not present

## 2022-11-09 DIAGNOSIS — R41841 Cognitive communication deficit: Secondary | ICD-10-CM | POA: Diagnosis not present

## 2022-11-09 DIAGNOSIS — Z431 Encounter for attention to gastrostomy: Secondary | ICD-10-CM | POA: Diagnosis not present

## 2022-11-09 MED ORDER — OMEPRAZOLE 40 MG PO CPDR: 40.0000 mg | DELAYED_RELEASE_CAPSULE | Freq: Every day | ORAL | 3 refills | Status: DC

## 2022-11-09 NOTE — Assessment & Plan Note (Signed)
Chronic persistent , no HI or SI, to continue pristiq fo rnow, for f/u psychiatry as planned, declines other change today

## 2022-11-09 NOTE — Telephone Encounter (Signed)
Okay. Thank you.

## 2022-11-09 NOTE — Assessment & Plan Note (Signed)
Lab Results  Component Value Date   LDLCALC 43 10/10/2021   Stable, pt to continue current statin  - diet,wt control, for f/u lab

## 2022-11-09 NOTE — Assessment & Plan Note (Signed)
Age and sex appropriate education and counseling updated with regular exercise and diet Referrals for preventative services - declines GYN referral Immunizations addressed - for shingrix at pharmacy Smoking counseling  - none needed Evidence for depression or other mood disorder - chronic resistant depression Most recent labs reviewed. I have personally reviewed and have noted: 1) the patient's medical and social history 2) The patient's current medications and supplements 3) The patient's height, weight, and BMI have been recorded in the chart

## 2022-11-09 NOTE — Assessment & Plan Note (Signed)
Persistent uncontrolled, Ok for trial nurtec 75 qod until seen per neurology next wk

## 2022-11-09 NOTE — Assessment & Plan Note (Signed)
Lab Results  Component Value Date   CREATININE 0.93 11/06/2022   Stable overall, cont to avoid nephrotoxins

## 2022-11-09 NOTE — Assessment & Plan Note (Signed)
Uncontrolled with recent mild worsening, for compression stockings daytime only

## 2022-11-09 NOTE — Assessment & Plan Note (Signed)
Lab Results  Component Value Date   HGBA1C 5.7 11/06/2022   Stable, pt to continue current medical treatment - diet, wt control

## 2022-11-09 NOTE — Assessment & Plan Note (Signed)
BP Readings from Last 3 Encounters:  11/06/22 126/78  10/25/22 (!) 159/91  08/30/22 139/80   Stable, pt to continue medical treatment card 60 mg tid hydralazine 100 tid

## 2022-11-09 NOTE — Telephone Encounter (Signed)
Ladona Ridgel with [facility] has called needing Verbal orders for Speech Therapy 2x3 and 1x5.  Ladona Ridgel [phone], secure VM for messages to be left.

## 2022-11-09 NOTE — Telephone Encounter (Signed)
Notified Tara [name] w/ MD response.Marland KitchenRaechel Chute

## 2022-11-09 NOTE — Telephone Encounter (Signed)
Ok for verbals 

## 2022-11-09 NOTE — Assessment & Plan Note (Signed)
Exam benign, but will need ua and culture if able to give specimen today

## 2022-11-12 ENCOUNTER — Telehealth: Payer: Self-pay | Admitting: Internal Medicine

## 2022-11-12 ENCOUNTER — Other Ambulatory Visit: Payer: Self-pay

## 2022-11-12 MED ORDER — ISOSORBIDE DINITRATE 10 MG PO TABS: 10.0000 mg | ORAL_TABLET | Freq: Three times a day (TID) | ORAL | 3 refills | Status: DC

## 2022-11-12 MED ORDER — DESVENLAFAXINE SUCCINATE ER 100 MG PO TB24: 100.0000 mg | ORAL_TABLET | Freq: Every day | ORAL | 3 refills | Status: DC

## 2022-11-12 NOTE — Telephone Encounter (Signed)
Tara [name] called from [facility] wanting Office visit notes for a sit to stand lift. Fax number [phone] Phone [phone]

## 2022-11-13 DIAGNOSIS — G2401 Drug induced subacute dyskinesia: Secondary | ICD-10-CM | POA: Diagnosis not present

## 2022-11-13 DIAGNOSIS — I129 Hypertensive chronic kidney disease with stage 1 through stage 4 chronic kidney disease, or unspecified chronic kidney disease: Secondary | ICD-10-CM | POA: Diagnosis not present

## 2022-11-13 DIAGNOSIS — D869 Sarcoidosis, unspecified: Secondary | ICD-10-CM | POA: Diagnosis not present

## 2022-11-13 DIAGNOSIS — Z431 Encounter for attention to gastrostomy: Secondary | ICD-10-CM | POA: Diagnosis not present

## 2022-11-13 DIAGNOSIS — F330 Major depressive disorder, recurrent, mild: Secondary | ICD-10-CM | POA: Diagnosis not present

## 2022-11-13 DIAGNOSIS — I480 Paroxysmal atrial fibrillation: Secondary | ICD-10-CM | POA: Diagnosis not present

## 2022-11-13 DIAGNOSIS — N1831 Chronic kidney disease, stage 3a: Secondary | ICD-10-CM | POA: Diagnosis not present

## 2022-11-13 DIAGNOSIS — J690 Pneumonitis due to inhalation of food and vomit: Secondary | ICD-10-CM | POA: Diagnosis not present

## 2022-11-13 DIAGNOSIS — M1990 Unspecified osteoarthritis, unspecified site: Secondary | ICD-10-CM | POA: Diagnosis not present

## 2022-11-13 DIAGNOSIS — G4733 Obstructive sleep apnea (adult) (pediatric): Secondary | ICD-10-CM | POA: Diagnosis not present

## 2022-11-13 DIAGNOSIS — R1312 Dysphagia, oropharyngeal phase: Secondary | ICD-10-CM | POA: Diagnosis not present

## 2022-11-13 DIAGNOSIS — Z931 Gastrostomy status: Secondary | ICD-10-CM | POA: Diagnosis not present

## 2022-11-13 DIAGNOSIS — D509 Iron deficiency anemia, unspecified: Secondary | ICD-10-CM | POA: Diagnosis not present

## 2022-11-13 DIAGNOSIS — J9691 Respiratory failure, unspecified with hypoxia: Secondary | ICD-10-CM | POA: Diagnosis not present

## 2022-11-13 DIAGNOSIS — R41841 Cognitive communication deficit: Secondary | ICD-10-CM | POA: Diagnosis not present

## 2022-11-13 DIAGNOSIS — F411 Generalized anxiety disorder: Secondary | ICD-10-CM | POA: Diagnosis not present

## 2022-11-13 DIAGNOSIS — R1313 Dysphagia, pharyngeal phase: Secondary | ICD-10-CM | POA: Diagnosis not present

## 2022-11-14 DIAGNOSIS — G2401 Drug induced subacute dyskinesia: Secondary | ICD-10-CM | POA: Diagnosis not present

## 2022-11-14 DIAGNOSIS — I480 Paroxysmal atrial fibrillation: Secondary | ICD-10-CM | POA: Diagnosis not present

## 2022-11-14 DIAGNOSIS — D509 Iron deficiency anemia, unspecified: Secondary | ICD-10-CM | POA: Diagnosis not present

## 2022-11-14 DIAGNOSIS — F330 Major depressive disorder, recurrent, mild: Secondary | ICD-10-CM | POA: Diagnosis not present

## 2022-11-14 DIAGNOSIS — I129 Hypertensive chronic kidney disease with stage 1 through stage 4 chronic kidney disease, or unspecified chronic kidney disease: Secondary | ICD-10-CM | POA: Diagnosis not present

## 2022-11-14 DIAGNOSIS — J690 Pneumonitis due to inhalation of food and vomit: Secondary | ICD-10-CM | POA: Diagnosis not present

## 2022-11-14 DIAGNOSIS — M1990 Unspecified osteoarthritis, unspecified site: Secondary | ICD-10-CM | POA: Diagnosis not present

## 2022-11-14 DIAGNOSIS — D869 Sarcoidosis, unspecified: Secondary | ICD-10-CM | POA: Diagnosis not present

## 2022-11-14 DIAGNOSIS — G4733 Obstructive sleep apnea (adult) (pediatric): Secondary | ICD-10-CM | POA: Diagnosis not present

## 2022-11-14 DIAGNOSIS — R41841 Cognitive communication deficit: Secondary | ICD-10-CM | POA: Diagnosis not present

## 2022-11-14 DIAGNOSIS — J9691 Respiratory failure, unspecified with hypoxia: Secondary | ICD-10-CM | POA: Diagnosis not present

## 2022-11-14 DIAGNOSIS — Z431 Encounter for attention to gastrostomy: Secondary | ICD-10-CM | POA: Diagnosis not present

## 2022-11-14 DIAGNOSIS — N1831 Chronic kidney disease, stage 3a: Secondary | ICD-10-CM | POA: Diagnosis not present

## 2022-11-14 DIAGNOSIS — F411 Generalized anxiety disorder: Secondary | ICD-10-CM | POA: Diagnosis not present

## 2022-11-14 DIAGNOSIS — R1312 Dysphagia, oropharyngeal phase: Secondary | ICD-10-CM | POA: Diagnosis not present

## 2022-11-14 NOTE — Telephone Encounter (Signed)
Ok to fax last OV note.  Let me know if needs to be addended for insurance purpose for the DME reqeust

## 2022-11-15 DIAGNOSIS — R41841 Cognitive communication deficit: Secondary | ICD-10-CM | POA: Diagnosis not present

## 2022-11-15 DIAGNOSIS — J9691 Respiratory failure, unspecified with hypoxia: Secondary | ICD-10-CM | POA: Diagnosis not present

## 2022-11-15 DIAGNOSIS — D869 Sarcoidosis, unspecified: Secondary | ICD-10-CM | POA: Diagnosis not present

## 2022-11-15 DIAGNOSIS — F330 Major depressive disorder, recurrent, mild: Secondary | ICD-10-CM | POA: Diagnosis not present

## 2022-11-15 DIAGNOSIS — Z431 Encounter for attention to gastrostomy: Secondary | ICD-10-CM | POA: Diagnosis not present

## 2022-11-15 DIAGNOSIS — G4733 Obstructive sleep apnea (adult) (pediatric): Secondary | ICD-10-CM | POA: Diagnosis not present

## 2022-11-15 DIAGNOSIS — D509 Iron deficiency anemia, unspecified: Secondary | ICD-10-CM | POA: Diagnosis not present

## 2022-11-15 DIAGNOSIS — F411 Generalized anxiety disorder: Secondary | ICD-10-CM | POA: Diagnosis not present

## 2022-11-15 DIAGNOSIS — J690 Pneumonitis due to inhalation of food and vomit: Secondary | ICD-10-CM | POA: Diagnosis not present

## 2022-11-15 DIAGNOSIS — R1312 Dysphagia, oropharyngeal phase: Secondary | ICD-10-CM | POA: Diagnosis not present

## 2022-11-15 DIAGNOSIS — M1990 Unspecified osteoarthritis, unspecified site: Secondary | ICD-10-CM | POA: Diagnosis not present

## 2022-11-15 DIAGNOSIS — N1831 Chronic kidney disease, stage 3a: Secondary | ICD-10-CM | POA: Diagnosis not present

## 2022-11-15 DIAGNOSIS — G2401 Drug induced subacute dyskinesia: Secondary | ICD-10-CM | POA: Diagnosis not present

## 2022-11-15 DIAGNOSIS — I129 Hypertensive chronic kidney disease with stage 1 through stage 4 chronic kidney disease, or unspecified chronic kidney disease: Secondary | ICD-10-CM | POA: Diagnosis not present

## 2022-11-15 DIAGNOSIS — I480 Paroxysmal atrial fibrillation: Secondary | ICD-10-CM | POA: Diagnosis not present

## 2022-11-15 NOTE — Telephone Encounter (Signed)
Faxed OV notes to [employer].Marland KitchenRaechel Chute

## 2022-11-15 NOTE — Telephone Encounter (Signed)
Ok note is addended, ok to fax and hopefully this will be sufficient

## 2022-11-15 NOTE — Telephone Encounter (Signed)
Yes the last ov must be addended for insurance../l;mb

## 2022-11-16 ENCOUNTER — Telehealth: Payer: Self-pay | Admitting: Internal Medicine

## 2022-11-16 ENCOUNTER — Other Ambulatory Visit (HOSPITAL_COMMUNITY): Payer: Self-pay

## 2022-11-16 MED ORDER — FLECAINIDE ACETATE 50 MG PO TABS: 50.0000 mg | ORAL_TABLET | Freq: Two times a day (BID) | ORAL | 0 refills | Status: DC

## 2022-11-16 NOTE — Telephone Encounter (Signed)
Patient called needing flecainide 50 mg.   [facility] on [address] [place] - [place]

## 2022-11-16 NOTE — Telephone Encounter (Signed)
Called pt gave her MD response. Will contact cardiology for refill.Marland KitchenRaechel Chute

## 2022-11-16 NOTE — Telephone Encounter (Signed)
Very sorry, this medication is normally refilled per cardiology.  I can do a "bridge" refill if needed, but please to contact cardiology first

## 2022-11-16 NOTE — Telephone Encounter (Signed)
Med was rx by Leroy Sea, MD pls advise on refill.Marland KitchenRaechel [name]

## 2022-11-19 ENCOUNTER — Other Ambulatory Visit (HOSPITAL_COMMUNITY): Payer: Self-pay | Admitting: *Deleted

## 2022-11-19 DIAGNOSIS — G4733 Obstructive sleep apnea (adult) (pediatric): Secondary | ICD-10-CM | POA: Diagnosis not present

## 2022-11-19 DIAGNOSIS — J9691 Respiratory failure, unspecified with hypoxia: Secondary | ICD-10-CM | POA: Diagnosis not present

## 2022-11-19 DIAGNOSIS — I480 Paroxysmal atrial fibrillation: Secondary | ICD-10-CM | POA: Diagnosis not present

## 2022-11-19 DIAGNOSIS — I129 Hypertensive chronic kidney disease with stage 1 through stage 4 chronic kidney disease, or unspecified chronic kidney disease: Secondary | ICD-10-CM | POA: Diagnosis not present

## 2022-11-19 DIAGNOSIS — N1831 Chronic kidney disease, stage 3a: Secondary | ICD-10-CM | POA: Diagnosis not present

## 2022-11-19 DIAGNOSIS — R41841 Cognitive communication deficit: Secondary | ICD-10-CM | POA: Diagnosis not present

## 2022-11-19 DIAGNOSIS — D509 Iron deficiency anemia, unspecified: Secondary | ICD-10-CM | POA: Diagnosis not present

## 2022-11-19 DIAGNOSIS — J690 Pneumonitis due to inhalation of food and vomit: Secondary | ICD-10-CM | POA: Diagnosis not present

## 2022-11-19 DIAGNOSIS — G2401 Drug induced subacute dyskinesia: Secondary | ICD-10-CM | POA: Diagnosis not present

## 2022-11-19 DIAGNOSIS — Z431 Encounter for attention to gastrostomy: Secondary | ICD-10-CM | POA: Diagnosis not present

## 2022-11-19 DIAGNOSIS — F330 Major depressive disorder, recurrent, mild: Secondary | ICD-10-CM | POA: Diagnosis not present

## 2022-11-19 DIAGNOSIS — F411 Generalized anxiety disorder: Secondary | ICD-10-CM | POA: Diagnosis not present

## 2022-11-19 DIAGNOSIS — D869 Sarcoidosis, unspecified: Secondary | ICD-10-CM | POA: Diagnosis not present

## 2022-11-19 DIAGNOSIS — M1990 Unspecified osteoarthritis, unspecified site: Secondary | ICD-10-CM | POA: Diagnosis not present

## 2022-11-19 DIAGNOSIS — R1312 Dysphagia, oropharyngeal phase: Secondary | ICD-10-CM | POA: Diagnosis not present

## 2022-11-19 MED ORDER — FLECAINIDE ACETATE 50 MG PO TABS: 50.0000 mg | ORAL_TABLET | Freq: Two times a day (BID) | ORAL | 0 refills | Status: DC

## 2022-11-20 ENCOUNTER — Other Ambulatory Visit: Payer: Self-pay

## 2022-11-20 ENCOUNTER — Emergency Department (HOSPITAL_COMMUNITY)
Admission: EM | Admit: 2022-11-20 | Discharge: 2022-11-21 | Disposition: A | Discharge status: Home / Self Care | Payer: BC Managed Care – PPO | Attending: Emergency Medicine | Admitting: Emergency Medicine

## 2022-11-20 ENCOUNTER — Emergency Department (HOSPITAL_COMMUNITY): Payer: BC Managed Care – PPO

## 2022-11-20 ENCOUNTER — Encounter (HOSPITAL_COMMUNITY): Payer: Self-pay

## 2022-11-20 DIAGNOSIS — R29818 Other symptoms and signs involving the nervous system: Secondary | ICD-10-CM | POA: Diagnosis not present

## 2022-11-20 DIAGNOSIS — R0602 Shortness of breath: Secondary | ICD-10-CM | POA: Diagnosis not present

## 2022-11-20 DIAGNOSIS — K5641 Fecal impaction: Secondary | ICD-10-CM | POA: Diagnosis not present

## 2022-11-20 DIAGNOSIS — N829 Female genital tract fistula, unspecified: Secondary | ICD-10-CM | POA: Diagnosis not present

## 2022-11-20 DIAGNOSIS — K5730 Diverticulosis of large intestine without perforation or abscess without bleeding: Secondary | ICD-10-CM | POA: Diagnosis not present

## 2022-11-20 DIAGNOSIS — Z79899 Other long term (current) drug therapy: Secondary | ICD-10-CM | POA: Diagnosis not present

## 2022-11-20 DIAGNOSIS — Z7901 Long term (current) use of anticoagulants: Secondary | ICD-10-CM | POA: Insufficient documentation

## 2022-11-20 DIAGNOSIS — R29810 Facial weakness: Secondary | ICD-10-CM | POA: Diagnosis not present

## 2022-11-20 DIAGNOSIS — N289 Disorder of kidney and ureter, unspecified: Secondary | ICD-10-CM | POA: Diagnosis not present

## 2022-11-20 DIAGNOSIS — R5383 Other fatigue: Secondary | ICD-10-CM | POA: Diagnosis not present

## 2022-11-20 DIAGNOSIS — N3000 Acute cystitis without hematuria: Secondary | ICD-10-CM | POA: Insufficient documentation

## 2022-11-20 DIAGNOSIS — G2401 Drug induced subacute dyskinesia: Secondary | ICD-10-CM | POA: Diagnosis not present

## 2022-11-20 DIAGNOSIS — N898 Other specified noninflammatory disorders of vagina: Secondary | ICD-10-CM | POA: Insufficient documentation

## 2022-11-20 DIAGNOSIS — R6884 Jaw pain: Secondary | ICD-10-CM | POA: Diagnosis not present

## 2022-11-20 DIAGNOSIS — N189 Chronic kidney disease, unspecified: Secondary | ICD-10-CM | POA: Insufficient documentation

## 2022-11-20 DIAGNOSIS — R0600 Dyspnea, unspecified: Secondary | ICD-10-CM | POA: Insufficient documentation

## 2022-11-20 DIAGNOSIS — Z0389 Encounter for observation for other suspected diseases and conditions ruled out: Secondary | ICD-10-CM | POA: Diagnosis not present

## 2022-11-20 DIAGNOSIS — G43919 Migraine, unspecified, intractable, without status migrainosus: Secondary | ICD-10-CM | POA: Diagnosis not present

## 2022-11-20 DIAGNOSIS — I129 Hypertensive chronic kidney disease with stage 1 through stage 4 chronic kidney disease, or unspecified chronic kidney disease: Secondary | ICD-10-CM | POA: Insufficient documentation

## 2022-11-20 DIAGNOSIS — R269 Unspecified abnormalities of gait and mobility: Secondary | ICD-10-CM | POA: Diagnosis not present

## 2022-11-20 LAB — COMPREHENSIVE METABOLIC PANEL
ALT: 39 U/L (ref 0–44)
AST: 35 U/L (ref 15–41)
Albumin: 3.9 g/dL (ref 3.5–5.0)
Alkaline Phosphatase: 114 U/L (ref 38–126)
Anion gap: 12 (ref 5–15)
BUN: 20 mg/dL (ref 8–23)
CO2: 26 mmol/L (ref 22–32)
Calcium: 10.1 mg/dL (ref 8.9–10.3)
Chloride: 100 mmol/L (ref 98–111)
Creatinine, Ser: 0.88 mg/dL (ref 0.44–1.00)
GFR, Estimated: >60 mL/min (ref >60)
Glucose, Bld: 93 mg/dL (ref 70–99)
Potassium: 4.1 mmol/L (ref 3.5–5.1)
Sodium: 138 mmol/L (ref 135–145)
Total Bilirubin: 0.6 mg/dL (ref 0.3–1.2)
Total Protein: 6.9 g/dL (ref 6.5–8.1)

## 2022-11-20 LAB — URINALYSIS, W/ REFLEX TO CULTURE (INFECTION SUSPECTED)
Bilirubin Urine: NEGATIVE
Glucose, UA: NEGATIVE mg/dL
Hgb urine dipstick: NEGATIVE
Ketones, ur: NEGATIVE mg/dL
Nitrite: NEGATIVE
Protein, ur: NEGATIVE mg/dL
Specific Gravity, Urine: 1.009 (ref 1.005–1.030)
pH: 7 (ref 5.0–8.0)

## 2022-11-20 LAB — CBC
HCT: 43.2 % (ref 36.0–46.0)
Hemoglobin: 13.9 g/dL (ref 12.0–15.0)
MCH: 28.4 pg (ref 26.0–34.0)
MCHC: 32.2 g/dL (ref 30.0–36.0)
MCV: 88.3 fL (ref 80.0–100.0)
Platelets: 223 10*3/uL (ref 150–400)
RBC: 4.89 MIL/uL (ref 3.87–5.11)
RDW: 15.6 % — ABNORMAL HIGH (ref 11.5–15.5)
WBC: 10.8 10*3/uL — ABNORMAL HIGH (ref 4.0–10.5)
nRBC: 0 % (ref 0.0–0.2)

## 2022-11-20 LAB — LIPASE, BLOOD: Lipase: 29 U/L (ref 11–51)

## 2022-11-20 MED ORDER — IOHEXOL 350 MG/ML SOLN: 75.0000 mL | Freq: Once | INTRAVENOUS | Status: DC | PRN

## 2022-11-20 MED ORDER — FLEET ENEMA 7-19 GM/118ML RE ENEM
1.0000 | ENEMA | Freq: Once | RECTAL | Status: AC
  Administered 2022-11-20: 1 via RECTAL
  Filled 2022-11-20: qty 1

## 2022-11-20 MED ORDER — IOHEXOL 350 MG/ML SOLN
75.0000 mL | Freq: Once | INTRAVENOUS | Status: AC | PRN
  Administered 2022-11-20: 75 mL via INTRAVENOUS

## 2022-11-20 MED ORDER — BISACODYL 10 MG RE SUPP: 10.0000 mg | RECTAL | 0 refills | Status: DC | PRN

## 2022-11-20 MED ORDER — SODIUM CHLORIDE 0.9 % IV SOLN
1.0000 g | Freq: Once | INTRAVENOUS | Status: AC
  Administered 2022-11-20: 1 g via INTRAVENOUS
  Filled 2022-11-20: qty 10

## 2022-11-20 MED ORDER — ACETAMINOPHEN 325 MG PO TABS
650.0000 mg | ORAL_TABLET | Freq: Once | ORAL | Status: AC
  Administered 2022-11-21: 650 mg via ORAL
  Filled 2022-11-20: qty 2

## 2022-11-20 MED ORDER — CEFUROXIME AXETIL 500 MG PO TABS: 500.0000 mg | ORAL_TABLET | Freq: Two times a day (BID) | ORAL | 0 refills | Status: AC

## 2022-11-20 MED ORDER — POLYETHYLENE GLYCOL 3350 17 G PO PACK: 17.0000 g | PACK | Freq: Two times a day (BID) | ORAL | 0 refills | Status: DC

## 2022-11-20 NOTE — Discharge Instructions (Addendum)
Take the antibiotics for the urinary tract infection.  Take the stool softeners to help with the constipation.  Follow-up with your [facility] GYN doctor for further evaluation.

## 2022-11-20 NOTE — ED Triage Notes (Signed)
Per pt's caregiver, pt has been having brownish discharge that smells like BM coming from vagina started yesterday. Pt denies abd pain.

## 2022-11-20 NOTE — ED Provider Notes (Signed)
[name] EMERGENCY DEPARTMENT AT [facility] Provider Note   CSN: [id number] Arrival date & time: 11/20/22  1757     History  Chief Complaint  Patient presents with   Vaginal Discharge    Tara [name] is a 72 y.o. female.   Vaginal Discharge    Patient has a history of depression, hypertension, morbid obesity, edema, migraines, sarcoidosis, chronic kidney disease, metabolic encephalopathy.  Patient is nonambulatory.  She uses a wheelchair.  She does have history of gastrostomy tube.  She has a caregiver that assist her at home.  Patient sees a neurologist.  She has had decreased mobility last several months.  They think it could be related to her nursing home hospitalization and deconditioning versus side effects from the valbenazine.  Patient's caregivers are and has to help her with her bowel movements.  Caregiver noticed today that she appeared to have feculent type drainage coming from her vaginal area.  Caregiver noticed it when she was urinating.  Patient's not had any fevers.  No vomiting.  No abdominal pain.  Home Medications Prior to Admission medications   Medication Sig Start Date End Date Taking? Authorizing Provider  bisacodyl (DULCOLAX) 10 MG suppository Place 1 suppository (10 mg total) rectally as needed for moderate constipation. 11/20/22  Yes Linwood Dibbles, MD  cefUROXime (CEFTIN) 500 MG tablet Take 1 tablet (500 mg total) by mouth 2 (two) times daily with a meal for 10 days. 11/20/22 11/30/22 Yes Linwood Dibbles, MD  polyethylene glycol (MIRALAX) 17 g packet Take 17 g by mouth 2 (two) times daily. 11/20/22  Yes Linwood Dibbles, MD  antiseptic oral rinse (BIOTENE) LIQD 15 mLs by Mouth Rinse route 3 (three) times daily as needed for dry mouth or mouth pain.    [provider]  apixaban (ELIQUIS) 5 MG TABS tablet Place 1 tablet (5 mg total) into feeding tube 2 (two) times daily. 07/03/22   Leroy Sea, MD  brimonidine-timolol (COMBIGAN) 0.2-0.5 % ophthalmic  solution Place 1 drop into both eyes every 12 (twelve) hours. 12/02/19   [provider]  carboxymethylcellulose (REFRESH PLUS) 0.5 % SOLN Place 1 drop into both eyes 3 (three) times daily as needed (Dry eyes). RETAIN eye drops instead of refresh    [provider]  cholecalciferol (CHOLECALCIFEROL) 25 MCG tablet Place 1 tablet (1,000 Units total) into feeding tube daily. 07/04/22   Leroy Sea, MD  clorazepate (TRANXENE) 7.5 MG tablet Place 1 tablet (7.5 mg total) into feeding tube 2 (two) times daily. 07/03/22   Leroy Sea, MD  desvenlafaxine (PRISTIQ) 100 MG 24 hr tablet Take 1 tablet (100 mg total) by mouth at bedtime. 11/12/22   Corwin Levins, MD  diltiazem (CARDIZEM) 60 MG tablet Place 1 tablet (60 mg total) into feeding tube every 8 (eight) hours. 07/03/22   Leroy Sea, MD  ferrous sulfate 300 (60 Fe) MG/5ML syrup Place 5 mLs (300 mg total) into feeding tube daily with breakfast. 07/04/22   Leroy Sea, MD  flecainide (TAMBOCOR) 50 MG tablet Place 1 tablet (50 mg total) into feeding tube 2 (two) times daily. 11/19/22   Fenton, Clint R, PA  fluticasone (FLONASE) 50 MCG/ACT nasal spray Place 2 sprays into both nostrils daily. 11/06/22   Corwin Levins, MD  gabapentin (NEURONTIN) 100 MG capsule Take 1 capsule (100 mg total) by mouth 3 (three) times daily. 11/06/22   Corwin Levins, MD  hydrALAZINE (APRESOLINE) 100 MG tablet Place 1 tablet (100  mg total) into feeding tube 3 (three) times daily. 07/03/22   Leroy Sea, MD  isosorbide dinitrate (ISORDIL) 10 MG tablet Place 1 tablet (10 mg total) into feeding tube 3 (three) times daily. 11/12/22   Corwin Levins, MD  latanoprost (XALATAN) 0.005 % ophthalmic solution Place 1 drop into both eyes at bedtime. 08/25/16   [provider]  melatonin 5 MG TABS Place 2 tablets (10 mg total) into feeding tube at bedtime. 07/03/22   Leroy Sea, MD  Multiple Vitamins-Minerals (MULTIVITAMIN WITH MINERALS)  tablet Place 1 tablet into feeding tube daily. 07/03/22   Leroy Sea, MD  Nutritional Supplements (FEEDING SUPPLEMENT, OSMOLITE 1.5 CAL,) LIQD 60 mLs by Per J Tube route continuous. 07/03/22   Leroy Sea, MD  Omega 3 1200 MG CAPS Place 2 capsules (2,400 mg total) into feeding tube at bedtime. 07/03/22   Leroy Sea, MD  omeprazole (PRILOSEC) 40 MG capsule Take 1 capsule (40 mg total) by mouth daily. 11/09/22   Corwin Levins, MD  senna (SENOKOT) 8.6 MG tablet Place 1 tablet (8.6 mg total) into feeding tube daily. 07/03/22   Leroy Sea, MD  valbenazine [facility]) 60 MG capsule Take 1 capsule (60 mg total) by mouth daily. 11/06/22   Corwin Levins, MD  Water For Irrigation, Sterile (FREE WATER) SOLN Place 250 mLs into feeding tube every 6 (six) hours. 07/03/22   Leroy Sea, MD      Allergies    Sulfa antibiotics and Fluoxetine hcl    Review of Systems   Review of Systems  Genitourinary:  Positive for vaginal discharge.    Physical Exam Updated Vital Signs BP (!) 165/108   Pulse (!) 120   Temp 97.8 F (36.6 C) (Oral)   Resp 17   Ht 1.676 m (5\' 6" )   Wt 83.9 kg   SpO2 97%   BMI 29.85 kg/m  Physical Exam Vitals and nursing note reviewed.  Constitutional:      Appearance: She is well-developed. She is not diaphoretic.  HENT:     Head: Normocephalic and atraumatic.     Right Ear: External ear normal.     Left Ear: External ear normal.  Eyes:     General: No scleral icterus.       Right eye: No discharge.        Left eye: No discharge.     Conjunctiva/sclera: Conjunctivae normal.  Neck:     Trachea: No tracheal deviation.  Cardiovascular:     Rate and Rhythm: Normal rate and regular rhythm.  Pulmonary:     Effort: Pulmonary effort is normal. No respiratory distress.     Breath sounds: Normal breath sounds. No stridor. No wheezing or rales.  Abdominal:     General: Bowel sounds are normal. There is no distension.     Palpations: Abdomen is soft.      Tenderness: There is no abdominal tenderness. There is no guarding or rebound.  Genitourinary:    Comments: External exam performed, no discharge noted Musculoskeletal:        General: No tenderness or deformity.     Cervical back: Neck supple.  Skin:    General: Skin is warm and dry.     Findings: No rash.  Neurological:     General: No focal deficit present.     Mental Status: She is alert.     Cranial Nerves: No cranial nerve deficit, dysarthria or facial asymmetry.  Sensory: No sensory deficit.     Motor: No abnormal muscle tone or seizure activity.     Coordination: Coordination normal.  Psychiatric:        Mood and Affect: Mood normal.     ED Results / Procedures / Treatments   Labs (all labs ordered are listed, but only abnormal results are displayed) Labs Reviewed  CBC - Abnormal; Notable for the following components:      Result Value   WBC 10.8 (*)    RDW 15.6 (*)    All other components within normal limits  URINALYSIS, W/ REFLEX TO CULTURE (INFECTION SUSPECTED) - Abnormal; Notable for the following components:   APPearance HAZY (*)    Leukocytes,Ua LARGE (*)    Bacteria, UA FEW (*)    All other components within normal limits  LIPASE, BLOOD  COMPREHENSIVE METABOLIC PANEL    EKG None  Radiology CT ABDOMEN PELVIS W CONTRAST  Result Date: 11/20/2022 CLINICAL DATA:  Colovaginal fistula EXAM: CT ABDOMEN AND PELVIS WITH CONTRAST TECHNIQUE: Multidetector CT imaging of the abdomen and pelvis was performed using the standard protocol following bolus administration of intravenous contrast. RADIATION DOSE REDUCTION: This exam was performed according to the departmental dose-optimization program which includes automated exposure control, adjustment of the mA and/or kV according to patient size and/or use of iterative reconstruction technique. CONTRAST:  75mL OMNIPAQUE IOHEXOL 350 MG/ML SOLN COMPARISON:  06/15/2022 FINDINGS: Lower chest: Stable cardiomegaly.  No  acute abnormality. Hepatobiliary: No focal liver abnormality is seen. No gallstones, gallbladder wall thickening, or biliary dilatation. Pancreas: Unremarkable Spleen: Unremarkable Adrenals/Urinary Tract: The adrenal glands are unremarkable. The kidneys are normal in position. Mild left renal cortical atrophy. Right kidney is normal in size. Scattered cortical hypodensities are too small to accurately characterize but likely represent multiple tiny cortical cysts. No follow-up imaging is recommended for these lesions. The kidneys are otherwise unremarkable. Bladder unremarkable. Stomach/Bowel: Large volume stool within the rectal vault noted with mild perirectal edema suggesting changes of fecal impaction/stercoral proctitis. Severe sigmoid diverticulosis without superimposed acute inflammatory change. The appendix is hypoplastic but is otherwise unremarkable. Balloon retention gastrostomy catheter noted within the mid body of the stomach. The stomach, small bowel, and large bowel are otherwise unremarkable. No evidence of obstruction or focal inflammation. No free intraperitoneal gas or fluid. Vascular/Lymphatic: Mild aortoiliac atherosclerotic calcification. No aortic aneurysm. No pathologic adenopathy within the abdomen and pelvis. Reproductive: Uterus and bilateral adnexa are unremarkable. Small gas is noted within the vaginal canal which may relate to the patient's reported colovaginal fistula, however, is nonspecific. Other: None significant Musculoskeletal: Degenerative changes are noted within the lumbar spine and hips. No acute bone abnormality. No lytic or blastic bone lesion. IMPRESSION: 1. Large volume stool within the rectal vault with mild perirectal edema suggesting changes of fecal impaction/stercoral proctitis. 2. Small gas within the vaginal canal which may relate to the patient's reported colovaginal fistula, however, is nonspecific. Colovaginal fistula may be better demonstrated by fluoroscopic  vaginogram 3. Severe sigmoid diverticulosis without superimposed acute inflammatory change. 4. Stable cardiomegaly. Aortic Atherosclerosis (ICD10-I70.0). Electronically Signed   By: Helyn Numbers M.D.   On: 11/20/2022 22:51    Procedures Procedures    Medications Ordered in ED Medications  iohexol (OMNIPAQUE) 350 MG/ML injection 75 mL (has no administration in time range)  cefTRIAXone (ROCEPHIN) 1 g in sodium chloride 0.9 % 100 mL IVPB (0 g Intravenous Stopped 11/20/22 2145)  iohexol (OMNIPAQUE) 350 MG/ML injection 75 mL (75 mLs Intravenous Contrast Given 11/20/22  2234)  sodium phosphate (FLEET) 7-19 GM/118ML enema 1 enema (1 enema Rectal Given 11/20/22 2330)    ED Course/ Medical Decision Making/ A&P Clinical Course as of 11/20/22 2346  Tue Nov 20, 2022  2038 Urinalysis suggestive of possible infection.  Metabolic panel normal.  White blood cell count elevated. [JK]  2311 CT scan shows large volume of stool within the rectal vault.  Mild perirectal edema suggesting changes of fecal impaction stercoral proctitis. [JK]  2312 Small gas within the vaginal canal but this is nonspecific.  No definite signs of colovaginal fistula.  Diverticulosis without evidence of diverticulitis or inflammatory change [JK]    Clinical Course User Index [JK] Linwood Dibbles, MD                             Medical Decision Making Problems Addressed: Acute cystitis without hematuria: acute illness or injury that poses a threat to life or bodily functions Fecal impaction [facility]): acute illness or injury that poses a threat to life or bodily functions  Amount and/or Complexity of Data Reviewed Labs: ordered. Decision-making details documented in ED Course. Radiology: ordered and independent interpretation performed.  Risk OTC drugs. Prescription drug management.   Patient presented to the ED for evaluation of possible feculent discharge from the vagina.  Was concerned about the possibility of fistula.  On exam no  signs of feculent vaginal discharge.  Patient's urinalysis is suggestive of infection.  It is possible this is what the caregiver noticed.  CT scan showed small amount of air but the patient did have a urinary catheterization procedure performed.  Patient also has evidence of fecal impaction CT scan.  On rectal exam his stool was soft.  Patient was given an enema now in the ED.  Will plan on discharge home with stool softeners and antibiotics.  Recommend follow-up with her GYN doctor for further evaluation.        Final Clinical Impression(s) / ED Diagnoses Final diagnoses:  Fecal impaction (HCC)  Acute cystitis without hematuria    Rx / DC Orders ED Discharge Orders          Ordered    cefUROXime (CEFTIN) 500 MG tablet  2 times daily with meals        11/20/22 2343    polyethylene glycol (MIRALAX) 17 g packet  2 times daily        11/20/22 2343    bisacodyl (DULCOLAX) 10 MG suppository  As needed        11/20/22 2343              Linwood Dibbles, MD 11/20/22 [phone]

## 2022-11-21 ENCOUNTER — Emergency Department (HOSPITAL_COMMUNITY)
Admission: EM | Admit: 2022-11-21 | Discharge: 2022-11-22 | Disposition: A | Discharge status: Home / Self Care | Payer: BC Managed Care – PPO | Source: Home / Self Care | Attending: Emergency Medicine | Admitting: Emergency Medicine

## 2022-11-21 ENCOUNTER — Emergency Department (HOSPITAL_COMMUNITY): Payer: BC Managed Care – PPO

## 2022-11-21 ENCOUNTER — Encounter (HOSPITAL_COMMUNITY): Payer: Self-pay

## 2022-11-21 DIAGNOSIS — R0600 Dyspnea, unspecified: Secondary | ICD-10-CM | POA: Insufficient documentation

## 2022-11-21 DIAGNOSIS — Z0389 Encounter for observation for other suspected diseases and conditions ruled out: Secondary | ICD-10-CM | POA: Diagnosis not present

## 2022-11-21 DIAGNOSIS — R5383 Other fatigue: Secondary | ICD-10-CM | POA: Diagnosis not present

## 2022-11-21 DIAGNOSIS — Z7901 Long term (current) use of anticoagulants: Secondary | ICD-10-CM | POA: Insufficient documentation

## 2022-11-21 DIAGNOSIS — R29810 Facial weakness: Secondary | ICD-10-CM | POA: Insufficient documentation

## 2022-11-21 DIAGNOSIS — R600 Localized edema: Secondary | ICD-10-CM | POA: Insufficient documentation

## 2022-11-21 DIAGNOSIS — I129 Hypertensive chronic kidney disease with stage 1 through stage 4 chronic kidney disease, or unspecified chronic kidney disease: Secondary | ICD-10-CM | POA: Insufficient documentation

## 2022-11-21 DIAGNOSIS — R0602 Shortness of breath: Secondary | ICD-10-CM | POA: Diagnosis not present

## 2022-11-21 DIAGNOSIS — Z7401 Bed confinement status: Secondary | ICD-10-CM | POA: Diagnosis not present

## 2022-11-21 DIAGNOSIS — N189 Chronic kidney disease, unspecified: Secondary | ICD-10-CM | POA: Insufficient documentation

## 2022-11-21 DIAGNOSIS — Z79899 Other long term (current) drug therapy: Secondary | ICD-10-CM | POA: Insufficient documentation

## 2022-11-21 DIAGNOSIS — I10 Essential (primary) hypertension: Secondary | ICD-10-CM | POA: Diagnosis not present

## 2022-11-21 LAB — COMPREHENSIVE METABOLIC PANEL
ALT: 36 U/L (ref 0–44)
AST: 30 U/L (ref 15–41)
Albumin: 3.5 g/dL (ref 3.5–5.0)
Alkaline Phosphatase: 113 U/L (ref 38–126)
Anion gap: 10 (ref 5–15)
BUN: 15 mg/dL (ref 8–23)
CO2: 27 mmol/L (ref 22–32)
Calcium: 9.7 mg/dL (ref 8.9–10.3)
Chloride: 101 mmol/L (ref 98–111)
Creatinine, Ser: 0.92 mg/dL (ref 0.44–1.00)
GFR, Estimated: >60 mL/min (ref >60)
Glucose, Bld: 145 mg/dL — ABNORMAL HIGH (ref 70–99)
Potassium: 3.7 mmol/L (ref 3.5–5.1)
Sodium: 138 mmol/L (ref 135–145)
Total Bilirubin: 0.4 mg/dL (ref 0.3–1.2)
Total Protein: 6.4 g/dL — ABNORMAL LOW (ref 6.5–8.1)

## 2022-11-21 LAB — D-DIMER, QUANTITATIVE: D-Dimer, Quant: 0.71 ug/mL-FEU — ABNORMAL HIGH (ref 0.00–0.50)

## 2022-11-21 LAB — CBC WITH DIFFERENTIAL/PLATELET
Abs Immature Granulocytes: 0.03 10*3/uL (ref 0.00–0.07)
Basophils Absolute: 0 10*3/uL (ref 0.0–0.1)
Basophils Relative: 1 %
Eosinophils Absolute: 0 10*3/uL (ref 0.0–0.5)
Eosinophils Relative: 0 %
HCT: 41.4 % (ref 36.0–46.0)
Hemoglobin: 13.6 g/dL (ref 12.0–15.0)
Immature Granulocytes: 1 %
Lymphocytes Relative: 21 %
Lymphs Abs: 1.3 10*3/uL (ref 0.7–4.0)
MCH: 28.6 pg (ref 26.0–34.0)
MCHC: 32.9 g/dL (ref 30.0–36.0)
MCV: 87.2 fL (ref 80.0–100.0)
Monocytes Absolute: 0.5 10*3/uL (ref 0.1–1.0)
Monocytes Relative: 8 %
Neutro Abs: 4.4 10*3/uL (ref 1.7–7.7)
Neutrophils Relative %: 69 %
Platelets: 212 10*3/uL (ref 150–400)
RBC: 4.75 MIL/uL (ref 3.87–5.11)
RDW: 15.7 % — ABNORMAL HIGH (ref 11.5–15.5)
WBC: 6.3 10*3/uL (ref 4.0–10.5)
nRBC: 0 % (ref 0.0–0.2)

## 2022-11-21 LAB — APTT: aPTT: 35 seconds (ref 24–36)

## 2022-11-21 LAB — PROTIME-INR
INR: 1.4 — ABNORMAL HIGH (ref 0.8–1.2)
Prothrombin Time: 16.9 seconds — ABNORMAL HIGH (ref 11.4–15.2)

## 2022-11-21 LAB — BRAIN NATRIURETIC PEPTIDE: B Natriuretic Peptide: 181.6 pg/mL — ABNORMAL HIGH (ref 0.0–100.0)

## 2022-11-21 LAB — LACTIC ACID, PLASMA: Lactic Acid, Venous: 1.4 mmol/L (ref 0.5–1.9)

## 2022-11-21 MED ORDER — ACETAMINOPHEN 325 MG PO TABS
650.0000 mg | ORAL_TABLET | Freq: Once | ORAL | Status: AC
  Administered 2022-11-21: 650 mg via ORAL
  Filled 2022-11-21: qty 2

## 2022-11-21 MED ORDER — LACTATED RINGERS IV BOLUS (SEPSIS)
500.0000 mL | Freq: Once | INTRAVENOUS | Status: AC
  Administered 2022-11-21: 500 mL via INTRAVENOUS

## 2022-11-21 MED ORDER — LACTATED RINGERS IV SOLN: INTRAVENOUS | Status: DC

## 2022-11-21 MED ORDER — LORAZEPAM 1 MG PO TABS: 1.0000 mg | ORAL_TABLET | ORAL | Status: DC | PRN

## 2022-11-21 NOTE — ED Provider Notes (Signed)
[name] Provider Note   CSN: [id number] Arrival date & time: 11/21/22  1821     History  Chief Complaint  Patient presents with   Fatigue    Tara [name] is a 72 y.o. female.  HPI   Patient has a history of multiple medical problems including depression, hypertension, obesity, edema, dyspnea, chronic kidney disease, metabolic encephalopathy, weakness.  Patient at baseline has limited mobility.  She has used a wheelchair.  She has a 24-hour caregiver.  Patient was seen by me yesterday in the ED for question of possible feculent vaginal discharge.  Patient had an evaluation including laboratory tests CT scanning.  Patient was noted to have a urinary tract infection.  There is no evidence of definite abnormality on CT scan.  Patient was discharged to follow-up with her OB/GYN doctor and continue antibiotic treatment.  Patient's caregiver states that her urine definitely is better today.  She is not having any that feculent discharge now.  She thinks maybe it was a urine infection.  However today the patient started to feel short of breath.  Has had issues like this before and they have attributed to anxiety.  Patient was brought to the ED for evaluation of her shortness of breath.  Patient denies any chest pain.  She denies any fevers.  Home Medications Prior to Admission medications   Medication Sig Start Date End Date Taking? Authorizing Provider  antiseptic oral rinse (BIOTENE) LIQD 15 mLs by Mouth Rinse route 3 (three) times daily as needed for dry mouth or mouth pain.    [provider]  apixaban (ELIQUIS) 5 MG TABS tablet Place 1 tablet (5 mg total) into feeding tube 2 (two) times daily. 07/03/22   Leroy Sea, MD  bisacodyl (DULCOLAX) 10 MG suppository Place 1 suppository (10 mg total) rectally as needed for moderate constipation. 11/20/22   Linwood Dibbles, MD  brimonidine-timolol (COMBIGAN) 0.2-0.5 % ophthalmic solution Place 1  drop into both eyes every 12 (twelve) hours. 12/02/19   [provider]  carboxymethylcellulose (REFRESH PLUS) 0.5 % SOLN Place 1 drop into both eyes 3 (three) times daily as needed (Dry eyes). RETAIN eye drops instead of refresh    [provider]  cefUROXime (CEFTIN) 500 MG tablet Take 1 tablet (500 mg total) by mouth 2 (two) times daily with a meal for 10 days. 11/20/22 11/30/22  Linwood Dibbles, MD  cholecalciferol (CHOLECALCIFEROL) 25 MCG tablet Place 1 tablet (1,000 Units total) into feeding tube daily. 07/04/22   Leroy Sea, MD  clorazepate (TRANXENE) 7.5 MG tablet Place 1 tablet (7.5 mg total) into feeding tube 2 (two) times daily. 07/03/22   Leroy Sea, MD  desvenlafaxine (PRISTIQ) 100 MG 24 hr tablet Take 1 tablet (100 mg total) by mouth at bedtime. 11/12/22   Corwin Levins, MD  diltiazem (CARDIZEM) 60 MG tablet Place 1 tablet (60 mg total) into feeding tube every 8 (eight) hours. 07/03/22   Leroy Sea, MD  ferrous sulfate 300 (60 Fe) MG/5ML syrup Place 5 mLs (300 mg total) into feeding tube daily with breakfast. 07/04/22   Leroy Sea, MD  flecainide (TAMBOCOR) 50 MG tablet Place 1 tablet (50 mg total) into feeding tube 2 (two) times daily. 11/19/22   Fenton, Clint R, PA  fluticasone (FLONASE) 50 MCG/ACT nasal spray Place 2 sprays into both nostrils daily. 11/06/22   Corwin Levins, MD  gabapentin (NEURONTIN) 100 MG capsule Take 1 capsule (100  mg total) by mouth 3 (three) times daily. 11/06/22   Corwin Levins, MD  hydrALAZINE (APRESOLINE) 100 MG tablet Place 1 tablet (100 mg total) into feeding tube 3 (three) times daily. 07/03/22   Leroy Sea, MD  isosorbide dinitrate (ISORDIL) 10 MG tablet Place 1 tablet (10 mg total) into feeding tube 3 (three) times daily. 11/12/22   Corwin Levins, MD  latanoprost (XALATAN) 0.005 % ophthalmic solution Place 1 drop into both eyes at bedtime. 08/25/16   [provider]  melatonin 5 MG TABS Place 2 tablets (10 mg  total) into feeding tube at bedtime. 07/03/22   Leroy Sea, MD  Multiple Vitamins-Minerals (MULTIVITAMIN WITH MINERALS) tablet Place 1 tablet into feeding tube daily. 07/03/22   Leroy Sea, MD  Nutritional Supplements (FEEDING SUPPLEMENT, OSMOLITE 1.5 CAL,) LIQD 60 mLs by Per J Tube route continuous. 07/03/22   Leroy Sea, MD  Omega 3 1200 MG CAPS Place 2 capsules (2,400 mg total) into feeding tube at bedtime. 07/03/22   Leroy Sea, MD  omeprazole (PRILOSEC) 40 MG capsule Take 1 capsule (40 mg total) by mouth daily. 11/09/22   Corwin Levins, MD  polyethylene glycol (MIRALAX) 17 g packet Take 17 g by mouth 2 (two) times daily. 11/20/22   Linwood Dibbles, MD  senna (SENOKOT) 8.6 MG tablet Place 1 tablet (8.6 mg total) into feeding tube daily. 07/03/22   Leroy Sea, MD  valbenazine [facility]) 60 MG capsule Take 1 capsule (60 mg total) by mouth daily. 11/06/22   Corwin Levins, MD  Water For Irrigation, Sterile (FREE WATER) SOLN Place 250 mLs into feeding tube every 6 (six) hours. 07/03/22   Leroy Sea, MD      Allergies    Sulfa antibiotics and Fluoxetine hcl    Review of Systems   Review of Systems  Physical Exam Updated Vital Signs BP (!) 175/94   Pulse 71   Temp 98.7 F (37.1 C) (Oral)   Resp 16   SpO2 100%  Physical Exam Vitals and nursing note reviewed.  Constitutional:      Appearance: She is well-developed.  HENT:     Head: Normocephalic and atraumatic.     Right Ear: External ear normal.     Left Ear: External ear normal.  Eyes:     General: No scleral icterus.       Right eye: No discharge.        Left eye: No discharge.     Conjunctiva/sclera: Conjunctivae normal.  Neck:     Trachea: No tracheal deviation.  Cardiovascular:     Rate and Rhythm: Normal rate and regular rhythm.  Pulmonary:     Effort: Pulmonary effort is normal. No respiratory distress.     Breath sounds: Normal breath sounds. No stridor. No wheezing or rales.   Abdominal:     General: Bowel sounds are normal. There is no distension.     Palpations: Abdomen is soft.     Tenderness: There is no abdominal tenderness. There is no guarding or rebound.  Musculoskeletal:        General: No tenderness or deformity.     Cervical back: Neck supple.     Right lower leg: Edema present.     Left lower leg: Edema present.  Skin:    General: Skin is warm and dry.     Findings: No rash.  Neurological:     General: No focal deficit present.  Mental Status: She is alert.     Cranial Nerves: No cranial nerve deficit, dysarthria or facial asymmetry.     Sensory: No sensory deficit.     Motor: Weakness present. No abnormal muscle tone or seizure activity.     Coordination: Coordination normal.     Comments: Able to lift both arms off the bed without drift, patient able lift both legs off the bed without drift, right-sided facial droop noted some drooling  Psychiatric:        Mood and Affect: Mood normal.     ED Results / Procedures / Treatments   Labs (all labs ordered are listed, but only abnormal results are displayed) Labs Reviewed  COMPREHENSIVE METABOLIC PANEL - Abnormal; Notable for the following components:      Result Value   Glucose, Bld 145 (*)    Total Protein 6.4 (*)    All other components within normal limits  CBC WITH DIFFERENTIAL/PLATELET - Abnormal; Notable for the following components:   RDW 15.7 (*)    All other components within normal limits  PROTIME-INR - Abnormal; Notable for the following components:   Prothrombin Time 16.9 (*)    INR 1.4 (*)    All other components within normal limits  D-DIMER, QUANTITATIVE - Abnormal; Notable for the following components:   D-Dimer, Quant 0.71 (*)    All other components within normal limits  BRAIN NATRIURETIC PEPTIDE - Abnormal; Notable for the following components:   B Natriuretic Peptide 181.6 (*)    All other components within normal limits  CULTURE, BLOOD (ROUTINE X 2)   CULTURE, BLOOD (ROUTINE X 2)  LACTIC ACID, PLASMA  APTT  LACTIC ACID, PLASMA  URINALYSIS, W/ REFLEX TO CULTURE (INFECTION SUSPECTED)    EKG EKG Interpretation  Date/Time:  Wednesday Nov 21 2022 18:44:03 EDT Ventricular Rate:  93 PR Interval:    QRS Duration: 96 QT Interval:  388 QTC Calculation: 483 R Axis:   26 Text Interpretation: Atrial fibrillation Abnormal inferior Q waves No significant change since last tracing Confirmed by Linwood Dibbles [phone]) on 11/21/2022 6:48:20 PM  Radiology CT Head Wo Contrast  Result Date: 11/21/2022 CLINICAL DATA:  Increased lethargy, history of UTI, initial encounter EXAM: CT HEAD WITHOUT CONTRAST TECHNIQUE: Contiguous axial images were obtained from the base of the skull through the vertex without intravenous contrast. RADIATION DOSE REDUCTION: This exam was performed according to the departmental dose-optimization program which includes automated exposure control, adjustment of the mA and/or kV according to patient size and/or use of iterative reconstruction technique. COMPARISON:  06/04/2022 FINDINGS: Brain: No evidence of acute infarction, hemorrhage, hydrocephalus, extra-axial collection or mass lesion/mass effect. Vascular: No hyperdense vessel or unexpected calcification. Skull: Normal. Negative for fracture or focal lesion. Sinuses/Orbits: No acute finding. Other: None. IMPRESSION: No acute intracranial abnormality noted. Electronically Signed   By: Alcide Clever M.D.   On: 11/21/2022 21:45   DG Chest Port 1 View  Result Date: 11/21/2022 CLINICAL DATA:  Possible sepsis EXAM: PORTABLE CHEST 1 VIEW COMPARISON:  07/01/2022 FINDINGS: Mild cardiomegaly. No acute airspace disease or effusion. Aortic atherosclerosis. No pneumothorax. IMPRESSION: No active disease. Mild cardiomegaly. Electronically Signed   By: Jasmine Pang M.D.   On: 11/21/2022 19:35   CT ABDOMEN PELVIS W CONTRAST  Result Date: 11/20/2022 CLINICAL DATA:  Colovaginal fistula EXAM: CT ABDOMEN  AND PELVIS WITH CONTRAST TECHNIQUE: Multidetector CT imaging of the abdomen and pelvis was performed using the standard protocol following bolus administration of intravenous contrast. RADIATION DOSE REDUCTION:  This exam was performed according to the departmental dose-optimization program which includes automated exposure control, adjustment of the mA and/or kV according to patient size and/or use of iterative reconstruction technique. CONTRAST:  75mL OMNIPAQUE IOHEXOL 350 MG/ML SOLN COMPARISON:  06/15/2022 FINDINGS: Lower chest: Stable cardiomegaly.  No acute abnormality. Hepatobiliary: No focal liver abnormality is seen. No gallstones, gallbladder wall thickening, or biliary dilatation. Pancreas: Unremarkable Spleen: Unremarkable Adrenals/Urinary Tract: The adrenal glands are unremarkable. The kidneys are normal in position. Mild left renal cortical atrophy. Right kidney is normal in size. Scattered cortical hypodensities are too small to accurately characterize but likely represent multiple tiny cortical cysts. No follow-up imaging is recommended for these lesions. The kidneys are otherwise unremarkable. Bladder unremarkable. Stomach/Bowel: Large volume stool within the rectal vault noted with mild perirectal edema suggesting changes of fecal impaction/stercoral proctitis. Severe sigmoid diverticulosis without superimposed acute inflammatory change. The appendix is hypoplastic but is otherwise unremarkable. Balloon retention gastrostomy catheter noted within the mid body of the stomach. The stomach, small bowel, and large bowel are otherwise unremarkable. No evidence of obstruction or focal inflammation. No free intraperitoneal gas or fluid. Vascular/Lymphatic: Mild aortoiliac atherosclerotic calcification. No aortic aneurysm. No pathologic adenopathy within the abdomen and pelvis. Reproductive: Uterus and bilateral adnexa are unremarkable. Small gas is noted within the vaginal canal which may relate to the  patient's reported colovaginal fistula, however, is nonspecific. Other: None significant Musculoskeletal: Degenerative changes are noted within the lumbar spine and hips. No acute bone abnormality. No lytic or blastic bone lesion. IMPRESSION: 1. Large volume stool within the rectal vault with mild perirectal edema suggesting changes of fecal impaction/stercoral proctitis. 2. Small gas within the vaginal canal which may relate to the patient's reported colovaginal fistula, however, is nonspecific. Colovaginal fistula may be better demonstrated by fluoroscopic vaginogram 3. Severe sigmoid diverticulosis without superimposed acute inflammatory change. 4. Stable cardiomegaly. Aortic Atherosclerosis (ICD10-I70.0). Electronically Signed   By: Helyn Numbers M.D.   On: 11/20/2022 22:51    Procedures Procedures    Medications Ordered in ED Medications  lactated ringers infusion ( Intravenous Restarted 11/21/22 2216)  LORazepam (ATIVAN) tablet 1 mg (has no administration in time range)  lactated ringers bolus 500 mL (0 mLs Intravenous Stopped 11/21/22 1930)  acetaminophen (TYLENOL) tablet 650 mg (650 mg Oral Given 11/21/22 2217)    ED Course/ Medical Decision Making/ A&P Clinical Course as of 11/21/22 2349  Wed Nov 21, 2022  2022 D-dimer slightly elevated 0.71 but would be within age-adjusted normal range.  BNP only slightly increased at 181.  Lactic acid level normal.  CBC and metabolic panel normal. [JK]  2100 Caregiver now notes that patient has been having issues with drooling from the right side of her face.  The right side of her face seems to be drooping this started earlier today in the afternoon. [JK]  2141 DIscussed with Dr Donovan Kail, will proceed with CT and MRI.  Review of prior neurology notes indicate patient does have flattening of the nasolabial fold.  It is possible some of this is more chronic than acute [JK]  2238 CT without acute findings [JK]    Clinical Course User Index [JK] Linwood Dibbles, MD                             Medical Decision Making Problems Addressed: Dyspnea, unspecified type: acute illness or injury that poses a threat to life or bodily functions Facial droop: chronic illness  or injury with exacerbation, progression, or side effects of treatment  Amount and/or Complexity of Data Reviewed Labs: ordered. Radiology: ordered. ECG/medicine tests: ordered.  Risk OTC drugs. Prescription drug management.   Patient initially presented to the ER for evaluation of shortness of breath.  Recently seen by me in the emergency room yesterday with concerns of vaginal discharge although ultimately.  She had a urinary tract infection.  There was concerns that she may have been developing some fevers at home although patient is afebrile here.  She does not have any leukocytosis.  No signs of lactic acidosis and she seems to be improving with her antibiotic treatment.  The patient does have history of anxiety which has caused some episodes of dyspnea in the past.  Her ED workup is not suggestive of pneumonia or pneumothorax.  No findings to suggest CHF.  Patient does not have an oxygen requirement.  She is breathing easily.  History suggest patient does have some flattened nasolabial folds.  Patient's caregiver mentioned worsening drooling today.  No other focal deficits noted that are new.  Initial head CT does not show any acute findings.  I discussed the case with Dr. Donovan Kail.  Patient does have baseline neurologic problems.  It is possible this is an exacerbation of that.  Will plan on MRI to rule out any acute stroke.  If negative patient appears stable for discharge to follow-up with her neurologist.        Final Clinical Impression(s) / ED Diagnoses Final diagnoses:  Dyspnea, unspecified type  Facial droop    Rx / DC Orders ED Discharge Orders     None         Linwood Dibbles, MD 11/21/22 2349

## 2022-11-21 NOTE — ED Triage Notes (Addendum)
Pt returns after being d/c with a UTI yesterday. Pt is more lethargic and does not feel well today. Pt c/o headache.  With caretaker.

## 2022-11-21 NOTE — ED Notes (Signed)
Did not receive information on right sided facial droop in report. Per caregiver, this is not pt's baseline and she first noted facial droop around 1400. Dr. Lynelle Doctor made aware.

## 2022-11-21 NOTE — ED Notes (Addendum)
Pt transported to CT ?

## 2022-11-22 ENCOUNTER — Other Ambulatory Visit: Payer: Self-pay | Admitting: Internal Medicine

## 2022-11-22 ENCOUNTER — Emergency Department (HOSPITAL_COMMUNITY): Payer: BC Managed Care – PPO

## 2022-11-22 DIAGNOSIS — I10 Essential (primary) hypertension: Secondary | ICD-10-CM | POA: Diagnosis not present

## 2022-11-22 DIAGNOSIS — Z7401 Bed confinement status: Secondary | ICD-10-CM | POA: Diagnosis not present

## 2022-11-22 DIAGNOSIS — R29818 Other symptoms and signs involving the nervous system: Secondary | ICD-10-CM | POA: Diagnosis not present

## 2022-11-22 LAB — CULTURE, BLOOD (ROUTINE X 2)

## 2022-11-22 LAB — LACTIC ACID, PLASMA: Lactic Acid, Venous: 1 mmol/L (ref 0.5–1.9)

## 2022-11-22 NOTE — ED Provider Notes (Signed)
Results for orders placed or performed during the hospital encounter of 11/21/22  Lactic acid, plasma  Result Value Ref Range   Lactic Acid, Venous 1.4 0.5 - 1.9 mmol/L  Lactic acid, plasma  Result Value Ref Range   Lactic Acid, Venous 1.0 0.5 - 1.9 mmol/L  Comprehensive metabolic panel  Result Value Ref Range   Sodium 138 135 - 145 mmol/L   Potassium 3.7 3.5 - 5.1 mmol/L   Chloride 101 98 - 111 mmol/L   CO2 27 22 - 32 mmol/L   Glucose, Bld 145 (H) 70 - 99 mg/dL   BUN 15 8 - 23 mg/dL   Creatinine, Ser 1.61 0.44 - 1.00 mg/dL   Calcium 9.7 8.9 - 09.6 mg/dL   Total Protein 6.4 (L) 6.5 - 8.1 g/dL   Albumin 3.5 3.5 - 5.0 g/dL   AST 30 15 - 41 U/L   ALT 36 0 - 44 U/L   Alkaline Phosphatase 113 38 - 126 U/L   Total Bilirubin 0.4 0.3 - 1.2 mg/dL   GFR, Estimated >04 >54 mL/min   Anion gap 10 5 - 15  CBC with Differential  Result Value Ref Range   WBC 6.3 4.0 - 10.5 K/uL   RBC 4.75 3.87 - 5.11 MIL/uL   Hemoglobin 13.6 12.0 - 15.0 g/dL   HCT 09.8 11.9 - 14.7 %   MCV 87.2 80.0 - 100.0 fL   MCH 28.6 26.0 - 34.0 pg   MCHC 32.9 30.0 - 36.0 g/dL   RDW 82.9 (H) 56.2 - 13.0 %   Platelets 212 150 - 400 K/uL   nRBC 0.0 0.0 - 0.2 %   Neutrophils Relative % 69 %   Neutro Abs 4.4 1.7 - 7.7 K/uL   Lymphocytes Relative 21 %   Lymphs Abs 1.3 0.7 - 4.0 K/uL   Monocytes Relative 8 %   Monocytes Absolute 0.5 0.1 - 1.0 K/uL   Eosinophils Relative 0 %   Eosinophils Absolute 0.0 0.0 - 0.5 K/uL   Basophils Relative 1 %   Basophils Absolute 0.0 0.0 - 0.1 K/uL   Immature Granulocytes 1 %   Abs Immature Granulocytes 0.03 0.00 - 0.07 K/uL  Protime-INR  Result Value Ref Range   Prothrombin Time 16.9 (H) 11.4 - 15.2 seconds   INR 1.4 (H) 0.8 - 1.2  APTT  Result Value Ref Range   aPTT 35 24 - 36 seconds  D-dimer, quantitative  Result Value Ref Range   D-Dimer, Quant 0.71 (H) 0.00 - 0.50 ug/mL-FEU  Brain natriuretic peptide  Result Value Ref Range   B Natriuretic Peptide 181.6 (H) 0.0 - 100.0  pg/mL   MR BRAIN WO CONTRAST  Result Date: 11/22/2022 CLINICAL DATA:  Acute neurologic deficit EXAM: MRI HEAD WITHOUT CONTRAST TECHNIQUE: Multiplanar, multiecho pulse sequences of the brain and surrounding structures were obtained without intravenous contrast. COMPARISON:  06/13/2022 FINDINGS: Brain: No acute infarct, mass effect or extra-axial collection. No acute or chronic hemorrhage. There is multifocal hyperintense T2-weighted signal within the white matter. Parenchymal volume and CSF spaces are normal. The midline structures are normal. Vascular: Major flow voids are preserved. Skull and upper cervical spine: Normal calvarium and skull base. Visualized upper cervical spine and soft tissues are normal. Sinuses/Orbits:No paranasal sinus fluid levels or advanced mucosal thickening. No mastoid or middle ear effusion. Normal orbits. IMPRESSION: 1. No acute intracranial abnormality. 2. Multifocal hyperintense T2-weighted signal within the white matter, most consistent with chronic small vessel ischemia. Electronically Signed   By:  Deatra Robinson M.D.   On: 11/22/2022 00:51   CT Head Wo Contrast  Result Date: 11/21/2022 CLINICAL DATA:  Increased lethargy, history of UTI, initial encounter EXAM: CT HEAD WITHOUT CONTRAST TECHNIQUE: Contiguous axial images were obtained from the base of the skull through the vertex without intravenous contrast. RADIATION DOSE REDUCTION: This exam was performed according to the departmental dose-optimization program which includes automated exposure control, adjustment of the mA and/or kV according to patient size and/or use of iterative reconstruction technique. COMPARISON:  06/04/2022 FINDINGS: Brain: No evidence of acute infarction, hemorrhage, hydrocephalus, extra-axial collection or mass lesion/mass effect. Vascular: No hyperdense vessel or unexpected calcification. Skull: Normal. Negative for fracture or focal lesion. Sinuses/Orbits: No acute finding. Other: None. IMPRESSION:  No acute intracranial abnormality noted. Electronically Signed   By: Alcide Clever M.D.   On: 11/21/2022 21:45   DG Chest Port 1 View  Result Date: 11/21/2022 CLINICAL DATA:  Possible sepsis EXAM: PORTABLE CHEST 1 VIEW COMPARISON:  07/01/2022 FINDINGS: Mild cardiomegaly. No acute airspace disease or effusion. Aortic atherosclerosis. No pneumothorax. IMPRESSION: No active disease. Mild cardiomegaly. Electronically Signed   By: Jasmine Pang M.D.   On: 11/21/2022 19:35   1:05 AM MRI negative for acute findings.  Patient stable for discharge to follow-up with her OP neurologist as per previous providers plan.   Garlon Hatchet, PA-C 11/22/22 0346    Nira Conn, MD 11/22/22 [phone]

## 2022-11-22 NOTE — ED Notes (Signed)
Pt to MRI

## 2022-11-22 NOTE — ED Notes (Signed)
Pt incontinent of urine. Pericare performed and brief changed.

## 2022-11-22 NOTE — Discharge Instructions (Addendum)
MRI today was negative for any acute stroke.  No explanation for shortness of breath and vitals are stable. Please follow-up with your neurologist. Return here for new concerns.

## 2022-11-24 LAB — CULTURE, BLOOD (ROUTINE X 2)

## 2022-11-25 LAB — CULTURE, BLOOD (ROUTINE X 2)

## 2022-11-26 ENCOUNTER — Other Ambulatory Visit: Payer: Self-pay

## 2022-11-26 LAB — CULTURE, BLOOD (ROUTINE X 2)
Culture: NO GROWTH
Culture: NO GROWTH

## 2022-11-26 MED ORDER — HYDRALAZINE HCL 100 MG PO TABS: 100.0000 mg | ORAL_TABLET | Freq: Three times a day (TID) | ORAL | 3 refills | Status: DC

## 2022-11-26 MED ORDER — DILTIAZEM HCL 60 MG PO TABS: 60.0000 mg | ORAL_TABLET | Freq: Three times a day (TID) | ORAL | 3 refills | Status: DC

## 2022-11-27 ENCOUNTER — Encounter: Payer: Self-pay | Admitting: Internal Medicine

## 2022-11-27 ENCOUNTER — Ambulatory Visit (INDEPENDENT_AMBULATORY_CARE_PROVIDER_SITE_OTHER): Payer: BC Managed Care – PPO | Admitting: Internal Medicine

## 2022-11-27 VITALS — BP 136/84 | HR 67 | Temp 98.3°F | Ht 66.0 in

## 2022-11-27 DIAGNOSIS — G2401 Drug induced subacute dyskinesia: Secondary | ICD-10-CM | POA: Diagnosis not present

## 2022-11-27 DIAGNOSIS — I10 Essential (primary) hypertension: Secondary | ICD-10-CM

## 2022-11-27 DIAGNOSIS — I480 Paroxysmal atrial fibrillation: Secondary | ICD-10-CM | POA: Diagnosis not present

## 2022-11-27 DIAGNOSIS — J690 Pneumonitis due to inhalation of food and vomit: Secondary | ICD-10-CM | POA: Diagnosis not present

## 2022-11-27 DIAGNOSIS — G4733 Obstructive sleep apnea (adult) (pediatric): Secondary | ICD-10-CM | POA: Diagnosis not present

## 2022-11-27 DIAGNOSIS — K5900 Constipation, unspecified: Secondary | ICD-10-CM | POA: Diagnosis not present

## 2022-11-27 DIAGNOSIS — I872 Venous insufficiency (chronic) (peripheral): Secondary | ICD-10-CM

## 2022-11-27 DIAGNOSIS — R41841 Cognitive communication deficit: Secondary | ICD-10-CM | POA: Diagnosis not present

## 2022-11-27 DIAGNOSIS — F330 Major depressive disorder, recurrent, mild: Secondary | ICD-10-CM | POA: Diagnosis not present

## 2022-11-27 DIAGNOSIS — D509 Iron deficiency anemia, unspecified: Secondary | ICD-10-CM | POA: Diagnosis not present

## 2022-11-27 DIAGNOSIS — D869 Sarcoidosis, unspecified: Secondary | ICD-10-CM | POA: Diagnosis not present

## 2022-11-27 DIAGNOSIS — N390 Urinary tract infection, site not specified: Secondary | ICD-10-CM

## 2022-11-27 DIAGNOSIS — N1831 Chronic kidney disease, stage 3a: Secondary | ICD-10-CM | POA: Diagnosis not present

## 2022-11-27 DIAGNOSIS — Z431 Encounter for attention to gastrostomy: Secondary | ICD-10-CM | POA: Diagnosis not present

## 2022-11-27 DIAGNOSIS — I129 Hypertensive chronic kidney disease with stage 1 through stage 4 chronic kidney disease, or unspecified chronic kidney disease: Secondary | ICD-10-CM | POA: Diagnosis not present

## 2022-11-27 DIAGNOSIS — F411 Generalized anxiety disorder: Secondary | ICD-10-CM | POA: Diagnosis not present

## 2022-11-27 DIAGNOSIS — M1990 Unspecified osteoarthritis, unspecified site: Secondary | ICD-10-CM | POA: Diagnosis not present

## 2022-11-27 DIAGNOSIS — R1312 Dysphagia, oropharyngeal phase: Secondary | ICD-10-CM | POA: Diagnosis not present

## 2022-11-27 DIAGNOSIS — J9691 Respiratory failure, unspecified with hypoxia: Secondary | ICD-10-CM | POA: Diagnosis not present

## 2022-11-27 NOTE — Assessment & Plan Note (Signed)
BP Readings from Last 3 Encounters:  11/27/22 136/84  11/22/22 128/75  11/21/22 (!) 168/86   Stable, pt to continue medical treatment cardizem 60 tid, hydralazine 100 tid

## 2022-11-27 NOTE — Progress Notes (Signed)
Patient ID: Tara [name], female   DOB: February 21, 1951, 72 y.o.   MRN: [id number]        Chief Complaint: follow up post ED visit x 2 - 5/7, then 5/8 -  5/9       HPI:  Tara [name] is a 72 y.o. female here with above, first with UTI and fecal impaction, then fatigue with sepsis and stroke ruled out.  Good compliacne with antibx, now with 3 days left.  Denies urinary symptoms such as dysuria, frequency, urgency, flank pain, hematuria or n/v, fever, chills.  Has ongoing consipation however, last DM approx 1 wk, Denies worsening reflux, abd pain, dysphagia, n/v, bowel change or blood.  Does have mild ongoing venous insufficiency left > right, has not yet started compressoin stockings.  Pt denies chest pain, increased sob or doe, wheezing, orthopnea, PND, palpitations, dizziness or syncope.   Pt denies polydipsia, polyuria, or new focal neuro s/s.          Wt Readings from Last 3 Encounters:  11/20/22 184 lb 15.5 oz (83.9 kg)  11/06/22 185 lb (83.9 kg)  08/30/22 179 lb (81.2 kg)   BP Readings from Last 3 Encounters:  11/27/22 136/84  11/22/22 128/75  11/21/22 (!) 168/86         Past Medical History:  Diagnosis Date   Anemia    on meds   ANKLE PAIN, LEFT 04/01/2008   ANXIETY 04/17/2007   on meds   Arthritis    generalized   Cataract    bilateral sx   Chronic kidney disease    stage 3   Colon polyp    COLONIC POLYPS, HX OF 08/01/2007   CONTUSIONS, MULTIPLE 04/01/2009   DEPRESSION 04/17/2007   on meds   DIZZINESS 08/01/2007   DYSPNEA 08/01/2007   with exertion   Enlargement of lymph nodes 08/13/2007   Excessive involuntary blinking    per pt,going on since 2018   Glaucoma    on meds   GLUCOSE INTOLERANCE 08/01/2007   Hypercalcemia due to sarcoidosis 2014   HYPERLIPIDEMIA 08/01/2007   no meds   HYPERSOMNIA 07/28/2008   HYPERTENSION 04/17/2007   Impaired glucose tolerance 03/23/2011   JOINT EFFUSION, LEFT KNEE 06/02/2010   Loose body in knee 04/01/2009   pt not sure?    Metabolic encephalopathy 12/09/2015-12/2015   Migraines    "stopped 3-4 yr ago" (07/23/2012)   Morbid obesity (HCC) 04/20/2007   OTHER DISEASES OF LUNG NOT ELSEWHERE CLASSIFIED 08/01/2007   Pain in joint, lower leg 04/01/2009   PERIPHERAL EDEMA 04/21/2009   Pre-diabetes    Sarcoidosis 09/25/2007   Seasonal allergies    SHOULDER PAIN, LEFT 04/01/2009   Sleep apnea    does not use Cpap   Past Surgical History:  Procedure Laterality Date   BREAST SURGERY     Biopsy benign/bil breaST   CARDIOVERSION N/A 12/15/2021   Procedure: CARDIOVERSION;  Surgeon: Chilton Si, MD;  Location: [facility] ENDOSCOPY;  Service: Cardiovascular;  Laterality: N/A;   CARDIOVERSION N/A 02/09/2022   Procedure: CARDIOVERSION;  Surgeon: Chrystie Nose, MD;  Location: [facility] ENDOSCOPY;  Service: Cardiovascular;  Laterality: N/A;   CATARACT EXTRACTION     RIGHT EYE   COLONOSCOPY  2020   KN-MAC-suprep(good)-tics/TA's   COMBINED MEDIASTINOSCOPY AND BRONCHOSCOPY  08/2007   COMBINED MEDIASTINOSCOPY AND BRONCHOSCOPY  2009   Dental implant     FRACTURE SURGERY  ?02/1997   "left upper arm; put rod in" (07/23/2012)   GUM SURGERY  2000-?2009   "  several ORs; soft tissue graft; took material from roof of mouth" (07/23/2012   HUMERUS SURGERY Left 1998   rod insertion   IR GASTROSTOMY TUBE MOD SED  06/22/2022   IR REPLC GASTRO/COLONIC TUBE PERCUT W/FLUORO  10/25/2022   KNEE ARTHROSCOPY  03/2004; 06/2009   "right; left" Dr. Thomasena Edis   KNEE SURGERY  2012   ARTHROSCOPIC LEFT KNEE   LYMPH NODE BIOPSY  ~ 2009   "for sarcoidosis; don't know exactly which nodes" (07/23/2102)   MYOMECTOMY  1994   Open   POLYPECTOMY  2020   TA's   REFRACTIVE SURGERY  08/1998   "both eyes" (07/23/2012)   REFRACTIVE SURGERY  2000   Bil   TOTAL KNEE ARTHROPLASTY Left 09/24/2016   Procedure: TOTAL KNEE ARTHROPLASTY;  Surgeon: Jodi Geralds, MD;  Location: [facility] OR;  Service: Orthopedics;  Laterality: Left;    reports that she has never smoked. She has never used  smokeless tobacco. She reports that she does not currently use alcohol after a past usage of about 1.0 - 2.0 standard drink of alcohol per week. She reports that she does not use drugs. family history includes Asthma in her sister; Diabetes in her mother; Gout in her sister; Heart disease (age of onset: 39) in her father; Hypertension in her father, mother, and sister; Stroke in her mother. Allergies  Allergen Reactions   Sulfa Antibiotics Rash   Fluoxetine Hcl Other (See Comments)    (PROZAC) Suicidal thoughts   Current Outpatient Medications on File Prior to Visit  Medication Sig Dispense Refill   antiseptic oral rinse (BIOTENE) LIQD 15 mLs by Mouth Rinse route 3 (three) times daily as needed for dry mouth or mouth pain.     apixaban (ELIQUIS) 5 MG TABS tablet Place 1 tablet (5 mg total) into feeding tube 2 (two) times daily. 180 tablet 1   baclofen (LIORESAL) 10 MG tablet Take 5 mg by mouth 3 (three) times daily.     bisacodyl (DULCOLAX) 10 MG suppository Place 1 suppository (10 mg total) rectally as needed for moderate constipation. 12 suppository 0   brimonidine-timolol (COMBIGAN) 0.2-0.5 % ophthalmic solution Place 1 drop into both eyes every 12 (twelve) hours.     carboxymethylcellulose (REFRESH PLUS) 0.5 % SOLN Place 1 drop into both eyes 3 (three) times daily as needed (Dry eyes). RETAIN eye drops instead of refresh     cefUROXime (CEFTIN) 500 MG tablet Take 1 tablet (500 mg total) by mouth 2 (two) times daily with a meal for 10 days. 20 tablet 0   cholecalciferol (CHOLECALCIFEROL) 25 MCG tablet Place 1 tablet (1,000 Units total) into feeding tube daily.     clorazepate (TRANXENE) 7.5 MG tablet Place 1 tablet (7.5 mg total) into feeding tube 2 (two) times daily. 4 tablet 0   desvenlafaxine (PRISTIQ) 100 MG 24 hr tablet Take 1 tablet (100 mg total) by mouth at bedtime. 90 tablet 3   diltiazem (CARDIZEM) 60 MG tablet Place 1 tablet (60 mg total) into feeding tube every 8 (eight) hours. 90  tablet 3   ferrous sulfate 300 (60 Fe) MG/5ML syrup Place 5 mLs (300 mg total) into feeding tube daily with breakfast. 150 mL 3   flecainide (TAMBOCOR) 50 MG tablet Place 1 tablet (50 mg total) into feeding tube 2 (two) times daily. 180 tablet 0   fluticasone (FLONASE) 50 MCG/ACT nasal spray Place 2 sprays into both nostrils daily. 16 g 6   gabapentin (NEURONTIN) 100 MG capsule TAKE 1 CAPSULE(100 MG)  BY MOUTH THREE TIMES DAILY 90 capsule 5   hydrALAZINE (APRESOLINE) 100 MG tablet Place 1 tablet (100 mg total) into feeding tube 3 (three) times daily. 90 tablet 3   isosorbide dinitrate (ISORDIL) 10 MG tablet Place 1 tablet (10 mg total) into feeding tube 3 (three) times daily. 90 tablet 3   latanoprost (XALATAN) 0.005 % ophthalmic solution Place 1 drop into both eyes at bedtime.     melatonin 5 MG TABS Place 2 tablets (10 mg total) into feeding tube at bedtime.  0   Multiple Vitamins-Minerals (MULTIVITAMIN WITH MINERALS) tablet Place 1 tablet into feeding tube daily.     Nutritional Supplements (FEEDING SUPPLEMENT, OSMOLITE 1.5 CAL,) LIQD 60 mLs by Per J Tube route continuous.     Omega 3 1200 MG CAPS Place 2 capsules (2,400 mg total) into feeding tube at bedtime.     omeprazole (PRILOSEC) 40 MG capsule Take 1 capsule (40 mg total) by mouth daily. 90 capsule 3   polyethylene glycol (MIRALAX) 17 g packet Take 17 g by mouth 2 (two) times daily. 14 each 0   senna (SENOKOT) 8.6 MG tablet Place 1 tablet (8.6 mg total) into feeding tube daily.     valbenazine (INGREZZA) 60 MG capsule Take 1 capsule (60 mg total) by mouth daily. 30 capsule 11   Water For Irrigation, Sterile (FREE WATER) SOLN Place 250 mLs into feeding tube every 6 (six) hours.     No current facility-administered medications on file prior to visit.        ROS:  All others reviewed and negative.  Objective        PE:  BP 136/84 (BP Location: Left Arm, Patient Position: Sitting, Cuff Size: Normal)   Pulse 67   Temp 98.3 F (36.8 C)  (Oral)   Ht 5\' 6"  (1.676 m)   SpO2 99%   BMI 29.85 kg/m                 Constitutional: Pt appears in NAD               HENT: Head: NCAT.                Right Ear: External ear normal.                 Left Ear: External ear normal.                Eyes: . Pupils are equal, round, and reactive to light. Conjunctivae and EOM are normal               Nose: without d/c or deformity               Neck: Neck supple. Gross normal ROM               Cardiovascular: Normal rate and regular rhythm.                 Pulmonary/Chest: Effort normal and breath sounds without rales or wheezing.                Abd:  Soft, NT, ND, + BS, no organomegaly               Neurological: Pt is alert. At baseline orientation, motor grossly intact               Skin: Skin is warm. No rashes, no other new lesions, LE edema - none  Psychiatric: Pt behavior is normal without agitation   Micro: none  Cardiac tracings I have personally interpreted today:  none  Pertinent Radiological findings (summarize): none   Lab Results  Component Value Date   WBC 6.3 11/21/2022   HGB 13.6 11/21/2022   HCT 41.4 11/21/2022   PLT 212 11/21/2022   GLUCOSE 145 (H) 11/21/2022   CHOL 174 11/06/2022   TRIG 224.0 (H) 11/06/2022   HDL 39.00 (L) 11/06/2022   LDLDIRECT 64.0 11/06/2022   LDLCALC 43 10/10/2021   ALT 36 11/21/2022   AST 30 11/21/2022   NA 138 11/21/2022   K 3.7 11/21/2022   CL 101 11/21/2022   CREATININE 0.92 11/21/2022   BUN 15 11/21/2022   CO2 27 11/21/2022   TSH 3.69 11/06/2022   INR 1.4 (H) 11/21/2022   HGBA1C 5.7 11/06/2022   MICROALBUR 1.0 10/06/2020   Assessment/Plan:  Shamonda Carbonaro is a 72 y.o. White or Caucasian [1] female with  has a past medical history of Anemia, ANKLE PAIN, LEFT (04/01/2008), ANXIETY (04/17/2007), Arthritis, Cataract, Chronic kidney disease, Colon polyp, COLONIC POLYPS, HX OF (08/01/2007), CONTUSIONS, MULTIPLE (04/01/2009), DEPRESSION (04/17/2007), DIZZINESS  (08/01/2007), DYSPNEA (08/01/2007), Enlargement of lymph nodes (08/13/2007), Excessive involuntary blinking, Glaucoma, GLUCOSE INTOLERANCE (08/01/2007), Hypercalcemia due to sarcoidosis (2014), HYPERLIPIDEMIA (08/01/2007), HYPERSOMNIA (07/28/2008), HYPERTENSION (04/17/2007), Impaired glucose tolerance (03/23/2011), JOINT EFFUSION, LEFT KNEE (06/02/2010), Loose body in knee (04/01/2009), Metabolic encephalopathy (12/09/2015-12/2015), Migraines, Morbid obesity (HCC) (04/20/2007), OTHER DISEASES OF LUNG NOT ELSEWHERE CLASSIFIED (08/01/2007), Pain in joint, lower leg (04/01/2009), PERIPHERAL EDEMA (04/21/2009), Pre-diabetes, Sarcoidosis (09/25/2007), Seasonal allergies, SHOULDER PAIN, LEFT (04/01/2009), and Sleep apnea.  UTI (urinary tract infection) Improving, afeb, asympt, non toxic appearing, to finish antibx as prescribed  Constipation Likely related to debility overall - for fleets enema prn, also colace 100 bid per g tube  Venous insufficiency Mild, for compression stocking daytime only  Essential hypertension BP Readings from Last 3 Encounters:  11/27/22 136/84  11/22/22 128/75  11/21/22 (!) 168/86   Stable, pt to continue medical treatment cardizem 60 tid, hydralazine 100 tid  Followup: Return in about 3 months (around 03/07/2023).  Oliver Barre, MD 11/27/2022 1:03 PM [facility]

## 2022-11-27 NOTE — Assessment & Plan Note (Signed)
Improving, afeb, asympt, non toxic appearing, to finish antibx as prescribed

## 2022-11-27 NOTE — Assessment & Plan Note (Signed)
Likely related to debility overall - for fleets enema prn, also colace 100 bid per g tube

## 2022-11-27 NOTE — Assessment & Plan Note (Signed)
Mild, for compression stocking daytime only

## 2022-11-27 NOTE — Patient Instructions (Signed)
Ok for prn fleets enema  Ok to start OTC Colace 100 mg twice per day  Please continue all other medications as before, and refills have been done if requested.  Please have the pharmacy call with any other refills you may need.  Please continue your efforts at being more active, low cholesterol diet, and weight control.  Please keep your appointments with your specialists as you may have planned - PT at home

## 2022-11-28 DIAGNOSIS — D869 Sarcoidosis, unspecified: Secondary | ICD-10-CM | POA: Diagnosis not present

## 2022-11-28 DIAGNOSIS — F411 Generalized anxiety disorder: Secondary | ICD-10-CM | POA: Diagnosis not present

## 2022-11-28 DIAGNOSIS — N1831 Chronic kidney disease, stage 3a: Secondary | ICD-10-CM | POA: Diagnosis not present

## 2022-11-28 DIAGNOSIS — R1312 Dysphagia, oropharyngeal phase: Secondary | ICD-10-CM | POA: Diagnosis not present

## 2022-11-28 DIAGNOSIS — J9691 Respiratory failure, unspecified with hypoxia: Secondary | ICD-10-CM | POA: Diagnosis not present

## 2022-11-28 DIAGNOSIS — I480 Paroxysmal atrial fibrillation: Secondary | ICD-10-CM | POA: Diagnosis not present

## 2022-11-28 DIAGNOSIS — G2401 Drug induced subacute dyskinesia: Secondary | ICD-10-CM | POA: Diagnosis not present

## 2022-11-28 DIAGNOSIS — D509 Iron deficiency anemia, unspecified: Secondary | ICD-10-CM | POA: Diagnosis not present

## 2022-11-28 DIAGNOSIS — F330 Major depressive disorder, recurrent, mild: Secondary | ICD-10-CM | POA: Diagnosis not present

## 2022-11-28 DIAGNOSIS — M1990 Unspecified osteoarthritis, unspecified site: Secondary | ICD-10-CM | POA: Diagnosis not present

## 2022-11-28 DIAGNOSIS — I129 Hypertensive chronic kidney disease with stage 1 through stage 4 chronic kidney disease, or unspecified chronic kidney disease: Secondary | ICD-10-CM | POA: Diagnosis not present

## 2022-11-28 DIAGNOSIS — G4733 Obstructive sleep apnea (adult) (pediatric): Secondary | ICD-10-CM | POA: Diagnosis not present

## 2022-11-28 DIAGNOSIS — R41841 Cognitive communication deficit: Secondary | ICD-10-CM | POA: Diagnosis not present

## 2022-11-28 DIAGNOSIS — J690 Pneumonitis due to inhalation of food and vomit: Secondary | ICD-10-CM | POA: Diagnosis not present

## 2022-11-28 DIAGNOSIS — Z431 Encounter for attention to gastrostomy: Secondary | ICD-10-CM | POA: Diagnosis not present

## 2022-11-29 ENCOUNTER — Ambulatory Visit (HOSPITAL_COMMUNITY)
Admission: RE | Admit: 2022-11-29 | Discharge: 2022-11-29 | Disposition: A | Discharge status: Home / Self Care | Payer: BC Managed Care – PPO | Source: Ambulatory Visit | Attending: Physician Assistant | Admitting: Physician Assistant

## 2022-11-29 VITALS — BP 140/78 | HR 61 | Ht 66.0 in

## 2022-11-29 DIAGNOSIS — Z79899 Other long term (current) drug therapy: Secondary | ICD-10-CM | POA: Diagnosis not present

## 2022-11-29 DIAGNOSIS — I129 Hypertensive chronic kidney disease with stage 1 through stage 4 chronic kidney disease, or unspecified chronic kidney disease: Secondary | ICD-10-CM | POA: Diagnosis not present

## 2022-11-29 DIAGNOSIS — R627 Adult failure to thrive: Secondary | ICD-10-CM | POA: Diagnosis not present

## 2022-11-29 DIAGNOSIS — Z6829 Body mass index (BMI) 29.0-29.9, adult: Secondary | ICD-10-CM | POA: Insufficient documentation

## 2022-11-29 DIAGNOSIS — I4819 Other persistent atrial fibrillation: Secondary | ICD-10-CM | POA: Insufficient documentation

## 2022-11-29 DIAGNOSIS — N183 Chronic kidney disease, stage 3 unspecified: Secondary | ICD-10-CM | POA: Diagnosis not present

## 2022-11-29 DIAGNOSIS — Z8249 Family history of ischemic heart disease and other diseases of the circulatory system: Secondary | ICD-10-CM | POA: Insufficient documentation

## 2022-11-29 DIAGNOSIS — D869 Sarcoidosis, unspecified: Secondary | ICD-10-CM | POA: Diagnosis not present

## 2022-11-29 DIAGNOSIS — Z7901 Long term (current) use of anticoagulants: Secondary | ICD-10-CM | POA: Diagnosis not present

## 2022-11-29 DIAGNOSIS — G4733 Obstructive sleep apnea (adult) (pediatric): Secondary | ICD-10-CM | POA: Diagnosis not present

## 2022-11-29 DIAGNOSIS — D6869 Other thrombophilia: Secondary | ICD-10-CM | POA: Insufficient documentation

## 2022-11-29 NOTE — Progress Notes (Signed)
Primary Care Physician: Corwin Levins, MD Primary Cardiologist: Dr Carolan Clines Primary Electrophysiologist: none Referring Physician: Dr Estella Husk is a 72 y.o. female with a history of CKD, sarcoidosis, HTN, OSA, atrial fibrillation who presents for follow up in the [facility].  She was seen by GI for surveillance colonoscopy and during her colonoscopy noted to be in afib. She admits she had been more tired than usual and had some intermittent lightheadedness. Patient is on Eliquis for a CHADS2VASC score of 3. She underwent DCCV on 12/15/21 which was unsuccessful. She denies significant alcohol use. She was started on flecainide and underwent DCCV on 02/09/22 but was back in afib on follow up 02/22/22.   Patient has had several ED visits and hospitalizations since her last visit. There is concern that neurosarcoidosis has contributed to her functional decline. During one of her hospitalizations her flecainide was resumed although there was no attempt to restore SR.   On follow up today, patient reports that she is unaware of her arrhythmia. Besides a mild headache, she has no other complaints today. No bleeding issues on anticoagulation.   Today, she denies symptoms of palpitations, chest pain, shortness of breath, orthopnea, PND, lower extremity edema, syncope, bleeding, or neurologic sequela. The patient is tolerating medications without difficulties and is otherwise without complaint today.    Atrial Fibrillation Risk Factors:  she does have symptoms or diagnosis of sleep apnea. she is compliant with CPAP therapy. she does not have a history of rheumatic fever. she does not have a history of alcohol use. The patient does have a history of early familial atrial fibrillation or other arrhythmias. Father has afib.  she has a BMI of Body mass index is 29.85 kg/m.Marland Kitchen Filed Weights    Family History  Problem Relation Age of Onset   Diabetes Mother     Hypertension Mother    Stroke Mother    Heart disease Father 72   Hypertension Father    Hypertension Sister    Asthma Sister    Gout Sister    Colon cancer Neg Hx    Breast cancer Neg Hx    Esophageal cancer Neg Hx    Stomach cancer Neg Hx    Rectal cancer Neg Hx    Colon polyps Neg Hx      Atrial Fibrillation Management history:  Previous antiarrhythmic drugs: flecainide  Previous cardioversions: 12/15/21, 02/09/22 Previous ablations: none CHADS2VASC score: 3 Anticoagulation history: Eliquis   Past Medical History:  Diagnosis Date   Anemia    on meds   ANKLE PAIN, LEFT 04/01/2008   ANXIETY 04/17/2007   on meds   Arthritis    generalized   Cataract    bilateral sx   Chronic kidney disease    stage 3   Colon polyp    COLONIC POLYPS, HX OF 08/01/2007   CONTUSIONS, MULTIPLE 04/01/2009   DEPRESSION 04/17/2007   on meds   DIZZINESS 08/01/2007   DYSPNEA 08/01/2007   with exertion   Enlargement of lymph nodes 08/13/2007   Excessive involuntary blinking    per pt,going on since 2018   Glaucoma    on meds   GLUCOSE INTOLERANCE 08/01/2007   Hypercalcemia due to sarcoidosis 2014   HYPERLIPIDEMIA 08/01/2007   no meds   HYPERSOMNIA 07/28/2008   HYPERTENSION 04/17/2007   Impaired glucose tolerance 03/23/2011   JOINT EFFUSION, LEFT KNEE 06/02/2010   Loose body in knee 04/01/2009   pt not  sure?   Metabolic encephalopathy 12/09/2015-12/2015   Migraines    "stopped 3-4 yr ago" (07/23/2012)   Morbid obesity (HCC) 04/20/2007   OTHER DISEASES OF LUNG NOT ELSEWHERE CLASSIFIED 08/01/2007   Pain in joint, lower leg 04/01/2009   PERIPHERAL EDEMA 04/21/2009   Pre-diabetes    Sarcoidosis 09/25/2007   Seasonal allergies    SHOULDER PAIN, LEFT 04/01/2009   Sleep apnea    does not use Cpap   Past Surgical History:  Procedure Laterality Date   BREAST SURGERY     Biopsy benign/bil breaST   CARDIOVERSION N/A 12/15/2021   Procedure: CARDIOVERSION;  Surgeon: Chilton Si, MD;  Location: [facility] ENDOSCOPY;  Service: Cardiovascular;  Laterality: N/A;   CARDIOVERSION N/A 02/09/2022   Procedure: CARDIOVERSION;  Surgeon: Chrystie Nose, MD;  Location: [facility] ENDOSCOPY;  Service: Cardiovascular;  Laterality: N/A;   CATARACT EXTRACTION     RIGHT EYE   COLONOSCOPY  2020   KN-MAC-suprep(good)-tics/TA's   COMBINED MEDIASTINOSCOPY AND BRONCHOSCOPY  08/2007   COMBINED MEDIASTINOSCOPY AND BRONCHOSCOPY  2009   Dental implant     FRACTURE SURGERY  ?02/1997   "left upper arm; put rod in" (07/23/2012)   GUM SURGERY  2000-?2009   "several ORs; soft tissue graft; took material from roof of mouth" (07/23/2012   HUMERUS SURGERY Left 1998   rod insertion   IR GASTROSTOMY TUBE MOD SED  06/22/2022   IR REPLC GASTRO/COLONIC TUBE PERCUT W/FLUORO  10/25/2022   KNEE ARTHROSCOPY  03/2004; 06/2009   "right; left" Dr. Thomasena Edis   KNEE SURGERY  2012   ARTHROSCOPIC LEFT KNEE   LYMPH NODE BIOPSY  ~ 2009   "for sarcoidosis; don't know exactly which nodes" (07/23/2102)   MYOMECTOMY  1994   Open   POLYPECTOMY  2020   TA's   REFRACTIVE SURGERY  08/1998   "both eyes" (07/23/2012)   REFRACTIVE SURGERY  2000   Bil   TOTAL KNEE ARTHROPLASTY Left 09/24/2016   Procedure: TOTAL KNEE ARTHROPLASTY;  Surgeon: Jodi Geralds, MD;  Location: [facility] OR;  Service: Orthopedics;  Laterality: Left;    Current Outpatient Medications  Medication Sig Dispense Refill   acetaminophen (TYLENOL) 650 MG CR tablet Take 650 mg by mouth every 8 (eight) hours as needed for pain.     antiseptic oral rinse (BIOTENE) LIQD 15 mLs by Mouth Rinse route 3 (three) times daily as needed for dry mouth or mouth pain.     apixaban (ELIQUIS) 5 MG TABS tablet Place 1 tablet (5 mg total) into feeding tube 2 (two) times daily. 180 tablet 1   baclofen (LIORESAL) 10 MG tablet Take 5 mg by mouth 3 (three) times daily.     bisacodyl (DULCOLAX) 10 MG suppository Place 1 suppository (10 mg total) rectally as needed for moderate constipation. 12  suppository 0   brimonidine-timolol (COMBIGAN) 0.2-0.5 % ophthalmic solution Place 1 drop into both eyes every 12 (twelve) hours.     carboxymethylcellulose (REFRESH PLUS) 0.5 % SOLN Place 1 drop into both eyes 3 (three) times daily as needed (Dry eyes). RETAIN eye drops instead of refresh     cefUROXime (CEFTIN) 500 MG tablet Take 1 tablet (500 mg total) by mouth 2 (two) times daily with a meal for 10 days. 20 tablet 0   cholecalciferol (CHOLECALCIFEROL) 25 MCG tablet Place 1 tablet (1,000 Units total) into feeding tube daily.     Cholecalciferol (VITAMIN D-3 PO) Take 1 tablet by mouth every morning.     clorazepate (TRANXENE) 7.5 MG  tablet Place 1 tablet (7.5 mg total) into feeding tube 2 (two) times daily. 4 tablet 0   desvenlafaxine (PRISTIQ) 100 MG 24 hr tablet Take 1 tablet (100 mg total) by mouth at bedtime. 90 tablet 3   diltiazem (CARDIZEM) 60 MG tablet Place 1 tablet (60 mg total) into feeding tube every 8 (eight) hours. 90 tablet 3   gabapentin (NEURONTIN) 100 MG capsule TAKE 1 CAPSULE(100 MG) BY MOUTH THREE TIMES DAILY 90 capsule 5   hydrALAZINE (APRESOLINE) 100 MG tablet Place 1 tablet (100 mg total) into feeding tube 3 (three) times daily. 90 tablet 3   isosorbide dinitrate (ISORDIL) 10 MG tablet Place 1 tablet (10 mg total) into feeding tube 3 (three) times daily. 90 tablet 3   latanoprost (XALATAN) 0.005 % ophthalmic solution Place 1 drop into both eyes at bedtime.     melatonin 5 MG TABS Place 2 tablets (10 mg total) into feeding tube at bedtime.  0   Nutritional Supplements (FEEDING SUPPLEMENT, OSMOLITE 1.5 CAL,) LIQD 60 mLs by Per J Tube route continuous.     omeprazole (PRILOSEC) 40 MG capsule Take 1 capsule (40 mg total) by mouth daily. 90 capsule 3   polyethylene glycol (MIRALAX) 17 g packet Take 17 g by mouth 2 (two) times daily. 14 each 0   valbenazine (INGREZZA) 60 MG capsule Take 1 capsule (60 mg total) by mouth daily. (Patient taking differently: Patient takes 40 mg by  mouth daily.) 30 capsule 11   Water For Irrigation, Sterile (FREE WATER) SOLN Place 250 mLs into feeding tube every 6 (six) hours.     ferrous sulfate 300 (60 Fe) MG/5ML syrup Place 5 mLs (300 mg total) into feeding tube daily with breakfast. (Patient not taking: Reported on 11/29/2022) 150 mL 3   fluticasone (FLONASE) 50 MCG/ACT nasal spray Place 2 sprays into both nostrils daily. (Patient not taking: Reported on 11/29/2022) 16 g 6   Multiple Vitamins-Minerals (MULTIVITAMIN WITH MINERALS) tablet Place 1 tablet into feeding tube daily. (Patient not taking: Reported on 11/29/2022)     Omega 3 1200 MG CAPS Place 2 capsules (2,400 mg total) into feeding tube at bedtime. (Patient not taking: Reported on 11/29/2022)     senna (SENOKOT) 8.6 MG tablet Place 1 tablet (8.6 mg total) into feeding tube daily. (Patient not taking: Reported on 11/29/2022)     No current facility-administered medications for this encounter.    Allergies  Allergen Reactions   Sulfa Antibiotics Rash   Fluoxetine Hcl Other (See Comments)    (PROZAC) Suicidal thoughts    Social History   Socioeconomic History   Marital status: Widowed    Spouse name: Not on file   Number of children: Not on file   Years of education: Not on file   Highest education level: Not on file  Occupational History   Occupation: Best boy  Tobacco Use   Smoking status: Never   Smokeless tobacco: Never   Tobacco comments:    Never smoke 12/25/21  Vaping Use   Vaping Use: Never used  Substance and Sexual Activity   Alcohol use: Not Currently    Alcohol/week: 1.0 - 2.0 standard drink of alcohol    Types: 1 - 2 Standard drinks or equivalent per week   Drug use: No   Sexual activity: Not Currently    Birth control/protection: Post-menopausal    Comment: 1st intercourse 72 yo-1 partner  Other Topics Concern   Not on file  Social History Narrative   Lives  with 4 cats   Social Determinants of Health   Financial Resource Strain: Not  on file  Food Insecurity: No Food Insecurity (06/14/2022)   Hunger Vital Sign    Worried About Running Out of Food in the Last Year: Never true    Ran Out of Food in the Last Year: Never true  Transportation Needs: No Transportation Needs (06/14/2022)   PRAPARE - Administrator, Civil Service (Medical): No    Lack of Transportation (Non-Medical): No  Physical Activity: Not on file  Stress: Not on file  Social Connections: Not on file  Intimate Partner Violence: Not At Risk (06/14/2022)   Humiliation, Afraid, Rape, and Kick questionnaire    Fear of Current or Ex-Partner: No    Emotionally Abused: No    Physically Abused: No    Sexually Abused: No     ROS- All systems are reviewed and negative except as per the HPI above.  Physical Exam: Vitals:   11/29/22 1037  BP: (!) 140/78  Pulse: 61  Height: 5\' 6"  (1.676 m)    GEN- The patient is a well appearing female, alert and oriented x 3 today.   HEENT-head normocephalic, atraumatic, sclera clear, conjunctiva pink, hearing intact, trachea midline. Lungs- Clear to ausculation bilaterally, normal work of breathing Heart- irregular rate and rhythm, no murmurs, rubs or gallops  GI- soft, NT, ND, + BS Extremities- no clubbing, cyanosis, or edema MS- no significant deformity or atrophy Skin- no rash or lesion Psych- euthymic mood, full affect Neuro- in wheelchair    Wt Readings from Last 3 Encounters:  11/20/22 83.9 kg  11/06/22 83.9 kg  08/30/22 81.2 kg    EKG today demonstrates  Afib Vent. rate 61 BPM PR interval * ms QRS duration 94 ms QT/QTcB 466/469 ms  Echo 06/05/22 demonstrated   1. Left ventricular ejection fraction, by estimation, is 55%. The left  ventricle has normal function. The left ventricle has no regional wall  motion abnormalities. There is mild asymmetric left ventricular  hypertrophy. Left ventricular diastolic parameters are indeterminate.   2. Right ventricular systolic function is  normal. The right ventricular  size is normal.   3. Left atrial size was moderately dilated.   4. Right atrial size was moderately dilated.   5. The mitral valve is normal in structure. Trivial mitral valve  regurgitation. No evidence of mitral stenosis.   6. The aortic valve is tricuspid. Aortic valve regurgitation is trivial.  Aortic valve sclerosis/calcification is present, without any evidence of  aortic stenosis.   Comparison(s): No significant change from prior study.   Epic records are reviewed at length today  CHA2DS2-VASc Score = 3  The patient's score is based upon: CHF History: 0 HTN History: 1 Diabetes History: 0 Stroke History: 0 Vascular Disease History: 0 Age Score: 1 Gender Score: 1        ASSESSMENT AND PLAN: 1. Persistent Atrial Fibrillation (ICD10:  I48.19) The patient's CHA2DS2-VASc score is 3, indicating a 3.2% annual risk of stroke.   Patient has been in afib since at least 02/2022 and likely longer than that, possibly longstanding at this point. I do not think she is benefiting from flecainide, not indicated for rate control, will discontinue. She is likely not an ablation candidate with her other medical issues. She is on valbenazine which is QT prolonging, not ideal with dofetilide. Rate control may be our best option.  Continue diltiazem 240 mg daily Continue Eliquis 5 mg BID  2. Secondary  Hypercoagulable State (ICD10:  D68.69) The patient is at significant risk for stroke/thromboembolism based upon her CHA2DS2-VASc Score of 3.  Continue Apixaban (Eliquis).   3. Failure to thrive/possible neurosarcoidosis  Followed by neurology and PCP.  4. Obstructive sleep apnea Encouraged compliance with CPAP therapy.  5. HTN Stable, no changes today.   Follow up with Dr Wyline Mood per recall. AF clinic as needed.    Jorja Loa PA-C Afib Clinic [facility] [address] [place], [place] [address] [phone] 11/29/2022 11:25 AM

## 2022-11-29 NOTE — Patient Instructions (Signed)
STOP Flecainide 

## 2022-11-30 DIAGNOSIS — F330 Major depressive disorder, recurrent, mild: Secondary | ICD-10-CM | POA: Diagnosis not present

## 2022-11-30 DIAGNOSIS — I480 Paroxysmal atrial fibrillation: Secondary | ICD-10-CM | POA: Diagnosis not present

## 2022-11-30 DIAGNOSIS — D869 Sarcoidosis, unspecified: Secondary | ICD-10-CM | POA: Diagnosis not present

## 2022-11-30 DIAGNOSIS — G4733 Obstructive sleep apnea (adult) (pediatric): Secondary | ICD-10-CM | POA: Diagnosis not present

## 2022-11-30 DIAGNOSIS — J690 Pneumonitis due to inhalation of food and vomit: Secondary | ICD-10-CM | POA: Diagnosis not present

## 2022-11-30 DIAGNOSIS — F411 Generalized anxiety disorder: Secondary | ICD-10-CM | POA: Diagnosis not present

## 2022-11-30 DIAGNOSIS — G2401 Drug induced subacute dyskinesia: Secondary | ICD-10-CM | POA: Diagnosis not present

## 2022-11-30 DIAGNOSIS — R1312 Dysphagia, oropharyngeal phase: Secondary | ICD-10-CM | POA: Diagnosis not present

## 2022-11-30 DIAGNOSIS — R41841 Cognitive communication deficit: Secondary | ICD-10-CM | POA: Diagnosis not present

## 2022-11-30 DIAGNOSIS — M1990 Unspecified osteoarthritis, unspecified site: Secondary | ICD-10-CM | POA: Diagnosis not present

## 2022-11-30 DIAGNOSIS — D509 Iron deficiency anemia, unspecified: Secondary | ICD-10-CM | POA: Diagnosis not present

## 2022-11-30 DIAGNOSIS — Z431 Encounter for attention to gastrostomy: Secondary | ICD-10-CM | POA: Diagnosis not present

## 2022-11-30 DIAGNOSIS — I129 Hypertensive chronic kidney disease with stage 1 through stage 4 chronic kidney disease, or unspecified chronic kidney disease: Secondary | ICD-10-CM | POA: Diagnosis not present

## 2022-11-30 DIAGNOSIS — N1831 Chronic kidney disease, stage 3a: Secondary | ICD-10-CM | POA: Diagnosis not present

## 2022-11-30 DIAGNOSIS — J9691 Respiratory failure, unspecified with hypoxia: Secondary | ICD-10-CM | POA: Diagnosis not present

## 2022-12-04 ENCOUNTER — Telehealth: Payer: Self-pay | Admitting: Internal Medicine

## 2022-12-04 ENCOUNTER — Other Ambulatory Visit: Payer: Self-pay

## 2022-12-04 MED ORDER — CLORAZEPATE DIPOTASSIUM 7.5 MG PO TABS: 7.5000 mg | ORAL_TABLET | Freq: Two times a day (BID) | ORAL | 0 refills | Status: DC

## 2022-12-04 MED ORDER — CLORAZEPATE DIPOTASSIUM 7.5 MG PO TABS: 7.5000 mg | ORAL_TABLET | Freq: Two times a day (BID) | ORAL | 1 refills | Status: DC | PRN

## 2022-12-04 NOTE — Telephone Encounter (Signed)
Prescription Request  12/04/2022  LOV: 11/27/2022  What is the name of the medication or equipment?  clorazepate (TRANXENE) 7.5 MG tablet   Have you contacted your pharmacy to request a refill? No   Which pharmacy would you like this sent to?    [id number] [place], [place] - [phone] AT [facility] [place] [address] [place] [address] Phone: [phone] Fax: [phone]   Please advise at Mobile [phone] (mobile)

## 2022-12-04 NOTE — Telephone Encounter (Signed)
Done erx 

## 2022-12-05 DIAGNOSIS — R41841 Cognitive communication deficit: Secondary | ICD-10-CM | POA: Diagnosis not present

## 2022-12-05 DIAGNOSIS — R1312 Dysphagia, oropharyngeal phase: Secondary | ICD-10-CM | POA: Diagnosis not present

## 2022-12-05 DIAGNOSIS — N1831 Chronic kidney disease, stage 3a: Secondary | ICD-10-CM | POA: Diagnosis not present

## 2022-12-05 DIAGNOSIS — I129 Hypertensive chronic kidney disease with stage 1 through stage 4 chronic kidney disease, or unspecified chronic kidney disease: Secondary | ICD-10-CM | POA: Diagnosis not present

## 2022-12-05 DIAGNOSIS — G4733 Obstructive sleep apnea (adult) (pediatric): Secondary | ICD-10-CM | POA: Diagnosis not present

## 2022-12-05 DIAGNOSIS — J9691 Respiratory failure, unspecified with hypoxia: Secondary | ICD-10-CM | POA: Diagnosis not present

## 2022-12-05 DIAGNOSIS — J690 Pneumonitis due to inhalation of food and vomit: Secondary | ICD-10-CM | POA: Diagnosis not present

## 2022-12-05 DIAGNOSIS — D509 Iron deficiency anemia, unspecified: Secondary | ICD-10-CM | POA: Diagnosis not present

## 2022-12-05 DIAGNOSIS — Z431 Encounter for attention to gastrostomy: Secondary | ICD-10-CM | POA: Diagnosis not present

## 2022-12-05 DIAGNOSIS — I480 Paroxysmal atrial fibrillation: Secondary | ICD-10-CM | POA: Diagnosis not present

## 2022-12-05 DIAGNOSIS — M1990 Unspecified osteoarthritis, unspecified site: Secondary | ICD-10-CM | POA: Diagnosis not present

## 2022-12-05 DIAGNOSIS — G2401 Drug induced subacute dyskinesia: Secondary | ICD-10-CM | POA: Diagnosis not present

## 2022-12-05 DIAGNOSIS — F330 Major depressive disorder, recurrent, mild: Secondary | ICD-10-CM | POA: Diagnosis not present

## 2022-12-05 DIAGNOSIS — D869 Sarcoidosis, unspecified: Secondary | ICD-10-CM | POA: Diagnosis not present

## 2022-12-05 DIAGNOSIS — F411 Generalized anxiety disorder: Secondary | ICD-10-CM | POA: Diagnosis not present

## 2022-12-06 DIAGNOSIS — M1990 Unspecified osteoarthritis, unspecified site: Secondary | ICD-10-CM | POA: Diagnosis not present

## 2022-12-06 DIAGNOSIS — J9691 Respiratory failure, unspecified with hypoxia: Secondary | ICD-10-CM | POA: Diagnosis not present

## 2022-12-06 DIAGNOSIS — F411 Generalized anxiety disorder: Secondary | ICD-10-CM | POA: Diagnosis not present

## 2022-12-06 DIAGNOSIS — D869 Sarcoidosis, unspecified: Secondary | ICD-10-CM | POA: Diagnosis not present

## 2022-12-06 DIAGNOSIS — G2401 Drug induced subacute dyskinesia: Secondary | ICD-10-CM | POA: Diagnosis not present

## 2022-12-06 DIAGNOSIS — I129 Hypertensive chronic kidney disease with stage 1 through stage 4 chronic kidney disease, or unspecified chronic kidney disease: Secondary | ICD-10-CM | POA: Diagnosis not present

## 2022-12-06 DIAGNOSIS — F330 Major depressive disorder, recurrent, mild: Secondary | ICD-10-CM | POA: Diagnosis not present

## 2022-12-06 DIAGNOSIS — N1831 Chronic kidney disease, stage 3a: Secondary | ICD-10-CM | POA: Diagnosis not present

## 2022-12-06 DIAGNOSIS — I480 Paroxysmal atrial fibrillation: Secondary | ICD-10-CM | POA: Diagnosis not present

## 2022-12-06 DIAGNOSIS — R41841 Cognitive communication deficit: Secondary | ICD-10-CM | POA: Diagnosis not present

## 2022-12-06 DIAGNOSIS — J690 Pneumonitis due to inhalation of food and vomit: Secondary | ICD-10-CM | POA: Diagnosis not present

## 2022-12-06 DIAGNOSIS — G4733 Obstructive sleep apnea (adult) (pediatric): Secondary | ICD-10-CM | POA: Diagnosis not present

## 2022-12-06 DIAGNOSIS — Z431 Encounter for attention to gastrostomy: Secondary | ICD-10-CM | POA: Diagnosis not present

## 2022-12-06 DIAGNOSIS — R1312 Dysphagia, oropharyngeal phase: Secondary | ICD-10-CM | POA: Diagnosis not present

## 2022-12-06 DIAGNOSIS — D509 Iron deficiency anemia, unspecified: Secondary | ICD-10-CM | POA: Diagnosis not present

## 2022-12-26 ENCOUNTER — Other Ambulatory Visit: Payer: Self-pay | Admitting: Internal Medicine

## 2022-12-26 DIAGNOSIS — R1310 Dysphagia, unspecified: Secondary | ICD-10-CM

## 2022-12-26 DIAGNOSIS — G249 Dystonia, unspecified: Secondary | ICD-10-CM

## 2022-12-27 ENCOUNTER — Telehealth: Payer: Self-pay | Admitting: Internal Medicine

## 2022-12-27 DIAGNOSIS — I480 Paroxysmal atrial fibrillation: Secondary | ICD-10-CM

## 2022-12-27 NOTE — Telephone Encounter (Signed)
Pt c/o medication issue:  1. Name of Medication:    apixaban (ELIQUIS) 5 MG TABS tablet    2. How are you currently taking this medication (dosage and times per day)? 1 tablet twice a day  3. Are you having a reaction (difficulty breathing--STAT)? no  4. What is your medication issue? Kira patient's caregiver states they just refilled the patient's eliquis and with her new insurance the copay is $530 dollars. She says they want to know if there is another medication that would be more reasonable Phone: [phone].

## 2022-12-27 NOTE — Telephone Encounter (Signed)
Tara [name] called and said patient insurance changed and so has her income. Eliquis is too expensive. It was $530 for her Eliquis. She would like a call so she can help patient do patient assistance.

## 2022-12-28 ENCOUNTER — Telehealth: Payer: Self-pay

## 2022-12-28 ENCOUNTER — Other Ambulatory Visit (HOSPITAL_COMMUNITY): Payer: Self-pay

## 2022-12-28 MED ORDER — APIXABAN 5 MG PO TABS
5.0000 mg | ORAL_TABLET | Freq: Two times a day (BID) | ORAL | 0 refills | Status: DC
Start: 2022-12-28 — End: 2023-04-09

## 2022-12-28 NOTE — Addendum Note (Signed)
Addended by: Cheree Ditto on: 12/28/2022 10:25 AM   Modules accepted: Orders

## 2022-12-28 NOTE — Telephone Encounter (Signed)
Per pts now (prev) plan [employer] /[employer] coverage ended 12/04/22

## 2022-12-28 NOTE — Telephone Encounter (Signed)
Called and spoke with Tara [name] regarding patient assistance process. Gave website and went over instructions on phone.   Patient took last Eliquis dose last night. Advised she can come and pick up samples while she gathers paperwork for application.

## 2023-01-01 ENCOUNTER — Telehealth: Payer: Self-pay | Admitting: Internal Medicine

## 2023-01-01 NOTE — Telephone Encounter (Signed)
Lia called from [facility] for PT 1 week- 8 weeks.  She wants to know if she needs verbal orders for an xray because pt lungs sound if they are filled with fluid.  Best call back number [phone]

## 2023-01-02 ENCOUNTER — Ambulatory Visit (INDEPENDENT_AMBULATORY_CARE_PROVIDER_SITE_OTHER): Payer: Medicare Other | Admitting: Internal Medicine

## 2023-01-02 VITALS — BP 140/88 | HR 60 | Temp 98.4°F

## 2023-01-02 DIAGNOSIS — R8290 Unspecified abnormal findings in urine: Secondary | ICD-10-CM | POA: Diagnosis not present

## 2023-01-02 DIAGNOSIS — T380X5D Adverse effect of glucocorticoids and synthetic analogues, subsequent encounter: Secondary | ICD-10-CM

## 2023-01-02 DIAGNOSIS — E099 Drug or chemical induced diabetes mellitus without complications: Secondary | ICD-10-CM | POA: Diagnosis not present

## 2023-01-02 DIAGNOSIS — R051 Acute cough: Secondary | ICD-10-CM

## 2023-01-02 DIAGNOSIS — N184 Chronic kidney disease, stage 4 (severe): Secondary | ICD-10-CM | POA: Diagnosis not present

## 2023-01-02 MED ORDER — ALBUTEROL SULFATE HFA 108 (90 BASE) MCG/ACT IN AERS: 2.0000 | INHALATION_SPRAY | Freq: Four times a day (QID) | RESPIRATORY_TRACT | 5 refills | Status: DC | PRN

## 2023-01-02 MED ORDER — CEPHALEXIN 500 MG PO CAPS
500.0000 mg | ORAL_CAPSULE | Freq: Three times a day (TID) | ORAL | 0 refills | Status: DC
Start: 2023-01-02 — End: 2023-04-01

## 2023-01-02 NOTE — Patient Instructions (Signed)
Please take all new medication as prescribed - the antibiotic which for lung as well as bladder infection if present  Please take all new medication as prescribed  - also an inhaler to be used for any worsening again of the wheezing  Please continue all other medications as before, and refills have been done if requested.  Please have the pharmacy call with any other refills you may need.  Please keep your appointments with your specialists as you may have planned

## 2023-01-02 NOTE — Telephone Encounter (Signed)
Called and left voice mail

## 2023-01-02 NOTE — Telephone Encounter (Signed)
Ok for verbal, but if she has any worsening sob or other unusual symptoms, she should go now to ED

## 2023-01-02 NOTE — Progress Notes (Signed)
Patient ID: Tara [name], female   DOB: 1950/12/30, 72 y.o.   MRN: [id number]        Chief Complaint: follow up acute cough and urine smell for 2-3 days, ckd, dm       HPI:  Tara [name] is a 72 y.o. female here with primary caretaker who helps with history; Here with acute onset mild to mod 2-3 days ST, HA, general weakness and malaise, with prod cough greenish sputum, but Pt denies chest pain, increased sob or doe, wheezing, orthopnea, PND, increased LE swelling, palpitations, dizziness or syncope.  Also incidentally with urinary smell bad unusual, but unable for other urinary symptoms such as dysuria, frequency, urgency, flank pain, hematuria or n/v, as pt has chronic incontinence.  Pt unable to stand for cxr as well.   Pt denies polydipsia, polyuria, or new focal neuro s/s.    Pt denies recent wt loss, night sweats, loss of appetite, or other constitutional symptoms       Wt Readings from Last 3 Encounters:  11/20/22 184 lb 15.5 oz (83.9 kg)  11/06/22 185 lb (83.9 kg)  08/30/22 179 lb (81.2 kg)   BP Readings from Last 3 Encounters:  01/02/23 (!) 140/88  11/29/22 (!) 140/78  11/27/22 136/84         Past Medical History:  Diagnosis Date   Anemia    on meds   ANKLE PAIN, LEFT 04/01/2008   ANXIETY 04/17/2007   on meds   Arthritis    generalized   Cataract    bilateral sx   Chronic kidney disease    stage 3   Colon polyp    COLONIC POLYPS, HX OF 08/01/2007   CONTUSIONS, MULTIPLE 04/01/2009   DEPRESSION 04/17/2007   on meds   DIZZINESS 08/01/2007   DYSPNEA 08/01/2007   with exertion   Enlargement of lymph nodes 08/13/2007   Excessive involuntary blinking    per pt,going on since 2018   Glaucoma    on meds   GLUCOSE INTOLERANCE 08/01/2007   Hypercalcemia due to sarcoidosis 2014   HYPERLIPIDEMIA 08/01/2007   no meds   HYPERSOMNIA 07/28/2008   HYPERTENSION 04/17/2007   Impaired glucose tolerance 03/23/2011   JOINT EFFUSION, LEFT KNEE 06/02/2010   Loose body in knee  04/01/2009   pt not sure?   Metabolic encephalopathy 12/09/2015-12/2015   Migraines    "stopped 3-4 yr ago" (07/23/2012)   Morbid obesity (HCC) 04/20/2007   OTHER DISEASES OF LUNG NOT ELSEWHERE CLASSIFIED 08/01/2007   Pain in joint, lower leg 04/01/2009   PERIPHERAL EDEMA 04/21/2009   Pre-diabetes    Sarcoidosis 09/25/2007   Seasonal allergies    SHOULDER PAIN, LEFT 04/01/2009   Sleep apnea    does not use Cpap   Past Surgical History:  Procedure Laterality Date   BREAST SURGERY     Biopsy benign/bil breaST   CARDIOVERSION N/A 12/15/2021   Procedure: CARDIOVERSION;  Surgeon: Chilton Si, MD;  Location: [facility] ENDOSCOPY;  Service: Cardiovascular;  Laterality: N/A;   CARDIOVERSION N/A 02/09/2022   Procedure: CARDIOVERSION;  Surgeon: Chrystie Nose, MD;  Location: [facility] ENDOSCOPY;  Service: Cardiovascular;  Laterality: N/A;   CATARACT EXTRACTION     RIGHT EYE   COLONOSCOPY  2020   KN-MAC-suprep(good)-tics/TA's   COMBINED MEDIASTINOSCOPY AND BRONCHOSCOPY  08/2007   COMBINED MEDIASTINOSCOPY AND BRONCHOSCOPY  2009   Dental implant     FRACTURE SURGERY  ?02/1997   "left upper arm; put rod in" (07/23/2012)   GUM SURGERY  2000-?2009   "  several ORs; soft tissue graft; took material from roof of mouth" (07/23/2012   HUMERUS SURGERY Left 1998   rod insertion   IR GASTROSTOMY TUBE MOD SED  06/22/2022   IR REPLC GASTRO/COLONIC TUBE PERCUT W/FLUORO  10/25/2022   KNEE ARTHROSCOPY  03/2004; 06/2009   "right; left" Dr. Thomasena Edis   KNEE SURGERY  2012   ARTHROSCOPIC LEFT KNEE   LYMPH NODE BIOPSY  ~ 2009   "for sarcoidosis; don't know exactly which nodes" (07/23/2102)   MYOMECTOMY  1994   Open   POLYPECTOMY  2020   TA's   REFRACTIVE SURGERY  08/1998   "both eyes" (07/23/2012)   REFRACTIVE SURGERY  2000   Bil   TOTAL KNEE ARTHROPLASTY Left 09/24/2016   Procedure: TOTAL KNEE ARTHROPLASTY;  Surgeon: Jodi Geralds, MD;  Location: [facility] OR;  Service: Orthopedics;  Laterality: Left;    reports that she has  never smoked. She has never used smokeless tobacco. She reports that she does not currently use alcohol after a past usage of about 1.0 - 2.0 standard drink of alcohol per week. She reports that she does not use drugs. family history includes Asthma in her sister; Diabetes in her mother; Gout in her sister; Heart disease (age of onset: 61) in her father; Hypertension in her father, mother, and sister; Stroke in her mother. Allergies  Allergen Reactions   Sulfa Antibiotics Rash   Fluoxetine Hcl Other (See Comments)    (PROZAC) Suicidal thoughts   Current Outpatient Medications on File Prior to Visit  Medication Sig Dispense Refill   acetaminophen (TYLENOL) 650 MG CR tablet Take 650 mg by mouth every 8 (eight) hours as needed for pain.     antiseptic oral rinse (BIOTENE) LIQD 15 mLs by Mouth Rinse route 3 (three) times daily as needed for dry mouth or mouth pain.     apixaban (ELIQUIS) 5 MG TABS tablet Place 1 tablet (5 mg total) into feeding tube 2 (two) times daily. 42 tablet 0   baclofen (LIORESAL) 10 MG tablet Take 5 mg by mouth 3 (three) times daily.     bisacodyl (DULCOLAX) 10 MG suppository Place 1 suppository (10 mg total) rectally as needed for moderate constipation. 12 suppository 0   brimonidine-timolol (COMBIGAN) 0.2-0.5 % ophthalmic solution Place 1 drop into both eyes every 12 (twelve) hours.     carboxymethylcellulose (REFRESH PLUS) 0.5 % SOLN Place 1 drop into both eyes 3 (three) times daily as needed (Dry eyes). RETAIN eye drops instead of refresh     cholecalciferol (CHOLECALCIFEROL) 25 MCG tablet Place 1 tablet (1,000 Units total) into feeding tube daily.     Cholecalciferol (VITAMIN D-3 PO) Take 1 tablet by mouth every morning.     clorazepate (TRANXENE) 7.5 MG tablet Place 1 tablet (7.5 mg total) into feeding tube 2 (two) times daily as needed for anxiety. 60 tablet 1   desvenlafaxine (PRISTIQ) 100 MG 24 hr tablet Take 1 tablet (100 mg total) by mouth at bedtime. 90 tablet 3    diltiazem (CARDIZEM) 60 MG tablet Place 1 tablet (60 mg total) into feeding tube every 8 (eight) hours. 90 tablet 3   ferrous sulfate 300 (60 Fe) MG/5ML syrup Place 5 mLs (300 mg total) into feeding tube daily with breakfast. 150 mL 3   fluticasone (FLONASE) 50 MCG/ACT nasal spray Place 2 sprays into both nostrils daily. 16 g 6   gabapentin (NEURONTIN) 100 MG capsule TAKE 1 CAPSULE(100 MG) BY MOUTH THREE TIMES DAILY 90 capsule 5  hydrALAZINE (APRESOLINE) 100 MG tablet Place 1 tablet (100 mg total) into feeding tube 3 (three) times daily. 90 tablet 3   isosorbide dinitrate (ISORDIL) 10 MG tablet Place 1 tablet (10 mg total) into feeding tube 3 (three) times daily. 90 tablet 3   latanoprost (XALATAN) 0.005 % ophthalmic solution Place 1 drop into both eyes at bedtime.     melatonin 5 MG TABS Place 2 tablets (10 mg total) into feeding tube at bedtime.  0   Multiple Vitamins-Minerals (MULTIVITAMIN WITH MINERALS) tablet Place 1 tablet into feeding tube daily.     Nutritional Supplements (FEEDING SUPPLEMENT, OSMOLITE 1.5 CAL,) LIQD 60 mLs by Per J Tube route continuous.     Omega 3 1200 MG CAPS Place 2 capsules (2,400 mg total) into feeding tube at bedtime.     omeprazole (PRILOSEC) 40 MG capsule Take 1 capsule (40 mg total) by mouth daily. 90 capsule 3   polyethylene glycol (MIRALAX) 17 g packet Take 17 g by mouth 2 (two) times daily. 14 each 0   senna (SENOKOT) 8.6 MG tablet Place 1 tablet (8.6 mg total) into feeding tube daily.     valbenazine (INGREZZA) 60 MG capsule Take 1 capsule (60 mg total) by mouth daily. (Patient taking differently: Patient takes 40 mg by mouth daily.) 30 capsule 11   Water For Irrigation, Sterile (FREE WATER) SOLN Place 250 mLs into feeding tube every 6 (six) hours.     No current facility-administered medications on file prior to visit.        ROS:  All others reviewed and negative.  Objective        PE:  BP (!) 140/88   Pulse 60   Temp 98.4 F (36.9 C) (Temporal)    SpO2 95%                 Constitutional: Pt appears in NAD               HENT: Head: NCAT.                Right Ear: External ear normal.                 Left Ear: External ear normal.                Eyes: . Pupils are equal, round, and reactive to light. Conjunctivae and EOM are normal               Nose: without d/c or deformity               Neck: Neck supple. Gross normal ROM               Cardiovascular: Normal rate and regular rhythm.                 Pulmonary/Chest: Effort normal and breath sounds without rales or wheezing.                Abd:  Soft, NT, ND, + BS, no organomegaly               Neurological: Pt is alert. At baseline orientation, motor grossly intact               Skin: Skin is warm. No rashes, no other new lesions, LE edema - none               Psychiatric: Pt behavior is normal without agitation   Micro: none  Cardiac  tracings I have personally interpreted today:  none  Pertinent Radiological findings (summarize): none   Lab Results  Component Value Date   WBC 6.3 11/21/2022   HGB 13.6 11/21/2022   HCT 41.4 11/21/2022   PLT 212 11/21/2022   GLUCOSE 145 (H) 11/21/2022   CHOL 174 11/06/2022   TRIG 224.0 (H) 11/06/2022   HDL 39.00 (L) 11/06/2022   LDLDIRECT 64.0 11/06/2022   LDLCALC 43 10/10/2021   ALT 36 11/21/2022   AST 30 11/21/2022   NA 138 11/21/2022   K 3.7 11/21/2022   CL 101 11/21/2022   CREATININE 0.92 11/21/2022   BUN 15 11/21/2022   CO2 27 11/21/2022   TSH 3.69 11/06/2022   INR 1.4 (H) 11/21/2022   HGBA1C 5.7 11/06/2022   MICROALBUR 1.0 10/06/2020   Assessment/Plan:  Tara [name] is a 72 y.o. White or Caucasian [1] female with  has a past medical history of Anemia, ANKLE PAIN, LEFT (04/01/2008), ANXIETY (04/17/2007), Arthritis, Cataract, Chronic kidney disease, Colon polyp, COLONIC POLYPS, HX OF (08/01/2007), CONTUSIONS, MULTIPLE (04/01/2009), DEPRESSION (04/17/2007), DIZZINESS (08/01/2007), DYSPNEA (08/01/2007), Enlargement of lymph  nodes (08/13/2007), Excessive involuntary blinking, Glaucoma, GLUCOSE INTOLERANCE (08/01/2007), Hypercalcemia due to sarcoidosis (2014), HYPERLIPIDEMIA (08/01/2007), HYPERSOMNIA (07/28/2008), HYPERTENSION (04/17/2007), Impaired glucose tolerance (03/23/2011), JOINT EFFUSION, LEFT KNEE (06/02/2010), Loose body in knee (04/01/2009), Metabolic encephalopathy (12/09/2015-12/2015), Migraines, Morbid obesity (HCC) (04/20/2007), OTHER DISEASES OF LUNG NOT ELSEWHERE CLASSIFIED (08/01/2007), Pain in joint, lower leg (04/01/2009), PERIPHERAL EDEMA (04/21/2009), Pre-diabetes, Sarcoidosis (09/25/2007), Seasonal allergies, SHOULDER PAIN, LEFT (04/01/2009), and Sleep apnea.  Steroid-induced diabetes Lab Results  Component Value Date   HGBA1C 5.7 11/06/2022   Stable, pt to continue current medical treatment  - diet, wt control   CKD (chronic kidney disease) stage 4, GFR 15-29 ml/min (HCC) Lab Results  Component Value Date   CREATININE 0.92 11/21/2022   Stable overall, cont to avoid nephrotoxins   Foul smelling urine Unable to give specimen today, for empiric antibx  Cough .Mild to mod, can't r/o cap, unable for cxr,  for antibx course,  to f/u any worsening symptoms or concerns  Followup: Return if symptoms worsen or fail to improve.  Oliver Barre, MD 01/05/2023 4:17 PM [facility]

## 2023-01-03 ENCOUNTER — Telehealth: Payer: Self-pay | Admitting: Cardiology

## 2023-01-03 NOTE — Telephone Encounter (Signed)
Spoke with [employer]. She wanted to verify I received patient assistance application. I clarified I did and it will be faxed off shortly.

## 2023-01-03 NOTE — Telephone Encounter (Signed)
Tara [name] pt's caregiver called in asking to speak back with Thayer Ohm, RPH-CPP about patient assistance started. Please advise.

## 2023-01-03 NOTE — Telephone Encounter (Signed)
Patient assistance application faxed to [employer] °

## 2023-01-05 ENCOUNTER — Encounter: Payer: Self-pay | Admitting: Internal Medicine

## 2023-01-05 DIAGNOSIS — R8290 Unspecified abnormal findings in urine: Secondary | ICD-10-CM | POA: Insufficient documentation

## 2023-01-05 NOTE — Assessment & Plan Note (Signed)
Lab Results  Component Value Date   CREATININE 0.92 11/21/2022   Stable overall, cont to avoid nephrotoxins

## 2023-01-05 NOTE — Assessment & Plan Note (Signed)
.  Mild to mod, can't r/o cap, unable for cxr,  for antibx course,  to f/u any worsening symptoms or concerns

## 2023-01-05 NOTE — Assessment & Plan Note (Signed)
Unable to give specimen today, for empiric antibx

## 2023-01-05 NOTE — Assessment & Plan Note (Signed)
Lab Results  Component Value Date   HGBA1C 5.7 11/06/2022   Stable, pt to continue current medical treatment - diet, wt control 

## 2023-01-08 ENCOUNTER — Telehealth: Payer: Self-pay | Admitting: Internal Medicine

## 2023-01-08 MED ORDER — ERYTHROMYCIN 5 MG/GM OP OINT: 1.0000 | TOPICAL_OINTMENT | Freq: Four times a day (QID) | OPHTHALMIC | 0 refills | Status: AC

## 2023-01-08 NOTE — Telephone Encounter (Signed)
Ok done erx to [facility] 

## 2023-01-08 NOTE — Telephone Encounter (Signed)
Patient called and said that she thinks she does have the pink eye like Dr. Jonny Ruiz thought on the 19th.  She wants to know if you can call in the antibiotic eye drop that you suggested.  Please call in to [facility] [place], [place].

## 2023-01-09 NOTE — Telephone Encounter (Signed)
Spke with Montel Clock and advised of patient assistance denial. Gave phone number for [employer] to see if there was other information she needed to submit. Also gave information about xarelto with me program to see if she would qualify.

## 2023-01-09 NOTE — Telephone Encounter (Signed)
Received fax that Eliquis patient assistance was denied. Will reach out to caregiver

## 2023-01-29 ENCOUNTER — Ambulatory Visit (INDEPENDENT_AMBULATORY_CARE_PROVIDER_SITE_OTHER): Payer: Medicare Other | Admitting: Internal Medicine

## 2023-01-29 VITALS — BP 116/68 | HR 52 | Temp 97.5°F | Ht 66.0 in

## 2023-01-29 DIAGNOSIS — H1013 Acute atopic conjunctivitis, bilateral: Secondary | ICD-10-CM

## 2023-01-29 DIAGNOSIS — E099 Drug or chemical induced diabetes mellitus without complications: Secondary | ICD-10-CM

## 2023-01-29 DIAGNOSIS — I10 Essential (primary) hypertension: Secondary | ICD-10-CM | POA: Diagnosis not present

## 2023-01-29 DIAGNOSIS — N184 Chronic kidney disease, stage 4 (severe): Secondary | ICD-10-CM

## 2023-01-29 DIAGNOSIS — J309 Allergic rhinitis, unspecified: Secondary | ICD-10-CM

## 2023-01-29 DIAGNOSIS — T380X5D Adverse effect of glucocorticoids and synthetic analogues, subsequent encounter: Secondary | ICD-10-CM

## 2023-01-29 MED ORDER — PREDNISONE 10 MG PO TABS
ORAL_TABLET | ORAL | 0 refills | Status: DC
Start: 2023-01-29 — End: 2023-04-01

## 2023-01-29 MED ORDER — METHYLPREDNISOLONE ACETATE 80 MG/ML IJ SUSP
80.0000 mg | Freq: Once | INTRAMUSCULAR | Status: AC
Start: 2023-01-29 — End: 2023-01-29
  Administered 2023-01-29: 80 mg via INTRAMUSCULAR

## 2023-01-29 MED ORDER — TOBRAMYCIN 0.3 % OP SOLN: 1.0000 [drp] | Freq: Four times a day (QID) | OPHTHALMIC | 0 refills | Status: AC

## 2023-01-29 NOTE — Patient Instructions (Addendum)
You had the steroid shot today  Please take all new medication as prescribed - the prednisone  Ok to take the eye drop antibiotic script but I would hold for 1-2 days as she would not need this if she is better with the steroid treatment  Please also change the Flonase to OTC Nasacort as this has less chance of nosebleeds  Please continue all other medications as before, and refills have been done if requested.  Please have the pharmacy call with any other refills you may need.  Please keep your appointments with your specialists as you may have planned  Please make an Appointment to return in Aug 22, or sooner if needed

## 2023-01-29 NOTE — Progress Notes (Addendum)
Patient ID: Tara [name], female   DOB: Oct 14, 1950, 72 y.o.   MRN: [id number]        Chief Complaint: follow up with private caretaker - bialteral eye itch and discharge, htn, ckd3a       HPI:  Tara [name] is a 72 y.o. female here with c/o 3 days onset bialteral eye redness weepy dc, without fever, chills, vision change, worsening sinus congestion or ST.  Pt denies chest pain, increased sob or doe, wheezing, orthopnea, PND, increased LE swelling, palpitations, dizziness or syncope.   Pt denies polydipsia, polyuria, or new focal neuro s/s.    Pt denies fever, wt loss, night sweats, loss of appetite, or other constitutional symptoms   Pt also has contracture of the left foot and left foot splint is needed to improve patients safety with gait and transfer.        Wt Readings from Last 3 Encounters:  11/20/22 184 lb 15.5 oz (83.9 kg)  11/06/22 185 lb (83.9 kg)  08/30/22 179 lb (81.2 kg)   BP Readings from Last 3 Encounters:  01/29/23 116/68  01/02/23 (!) 140/88  11/29/22 (!) 140/78         Past Medical History:  Diagnosis Date   Anemia    on meds   ANKLE PAIN, LEFT 04/01/2008   ANXIETY 04/17/2007   on meds   Arthritis    generalized   Cataract    bilateral sx   Chronic kidney disease    stage 3   Colon polyp    COLONIC POLYPS, HX OF 08/01/2007   CONTUSIONS, MULTIPLE 04/01/2009   DEPRESSION 04/17/2007   on meds   DIZZINESS 08/01/2007   DYSPNEA 08/01/2007   with exertion   Enlargement of lymph nodes 08/13/2007   Excessive involuntary blinking    per pt,going on since 2018   Glaucoma    on meds   GLUCOSE INTOLERANCE 08/01/2007   Hypercalcemia due to sarcoidosis 2014   HYPERLIPIDEMIA 08/01/2007   no meds   HYPERSOMNIA 07/28/2008   HYPERTENSION 04/17/2007   Impaired glucose tolerance 03/23/2011   JOINT EFFUSION, LEFT KNEE 06/02/2010   Loose body in knee 04/01/2009   pt not sure?   Metabolic encephalopathy 12/09/2015-12/2015   Migraines    "stopped 3-4 yr ago"  (07/23/2012)   Morbid obesity (HCC) 04/20/2007   OTHER DISEASES OF LUNG NOT ELSEWHERE CLASSIFIED 08/01/2007   Pain in joint, lower leg 04/01/2009   PERIPHERAL EDEMA 04/21/2009   Pre-diabetes    Sarcoidosis 09/25/2007   Seasonal allergies    SHOULDER PAIN, LEFT 04/01/2009   Sleep apnea    does not use Cpap   Past Surgical History:  Procedure Laterality Date   BREAST SURGERY     Biopsy benign/bil breaST   CARDIOVERSION N/A 12/15/2021   Procedure: CARDIOVERSION;  Surgeon: Chilton Si, MD;  Location: [facility] ENDOSCOPY;  Service: Cardiovascular;  Laterality: N/A;   CARDIOVERSION N/A 02/09/2022   Procedure: CARDIOVERSION;  Surgeon: Chrystie Nose, MD;  Location: [facility] ENDOSCOPY;  Service: Cardiovascular;  Laterality: N/A;   CATARACT EXTRACTION     RIGHT EYE   COLONOSCOPY  2020   KN-MAC-suprep(good)-tics/TA's   COMBINED MEDIASTINOSCOPY AND BRONCHOSCOPY  08/2007   COMBINED MEDIASTINOSCOPY AND BRONCHOSCOPY  2009   Dental implant     FRACTURE SURGERY  ?02/1997   "left upper arm; put rod in" (07/23/2012)   GUM SURGERY  2000-?2009   "several ORs; soft tissue graft; took material from roof of mouth" (07/23/2012   HUMERUS SURGERY Left  1998   rod insertion   IR GASTROSTOMY TUBE MOD SED  06/22/2022   IR REPLC GASTRO/COLONIC TUBE PERCUT W/FLUORO  10/25/2022   KNEE ARTHROSCOPY  03/2004; 06/2009   "right; left" Dr. Thomasena Edis   KNEE SURGERY  2012   ARTHROSCOPIC LEFT KNEE   LYMPH NODE BIOPSY  ~ 2009   "for sarcoidosis; don't know exactly which nodes" (07/23/2102)   MYOMECTOMY  1994   Open   POLYPECTOMY  2020   TA's   REFRACTIVE SURGERY  08/1998   "both eyes" (07/23/2012)   REFRACTIVE SURGERY  2000   Bil   TOTAL KNEE ARTHROPLASTY Left 09/24/2016   Procedure: TOTAL KNEE ARTHROPLASTY;  Surgeon: Jodi Geralds, MD;  Location: [facility] OR;  Service: Orthopedics;  Laterality: Left;    reports that she has never smoked. She has never used smokeless tobacco. She reports that she does not currently use alcohol after a  past usage of about 1.0 - 2.0 standard drink of alcohol per week. She reports that she does not use drugs. family history includes Asthma in her sister; Diabetes in her mother; Gout in her sister; Heart disease (age of onset: 45) in her father; Hypertension in her father, mother, and sister; Stroke in her mother. Allergies  Allergen Reactions   Sulfa Antibiotics Rash   Fluoxetine Hcl Other (See Comments)    (PROZAC) Suicidal thoughts   Current Outpatient Medications on File Prior to Visit  Medication Sig Dispense Refill   acetaminophen (TYLENOL) 650 MG CR tablet Take 650 mg by mouth every 8 (eight) hours as needed for pain.     albuterol (VENTOLIN HFA) 108 (90 Base) MCG/ACT inhaler Inhale 2 puffs into the lungs every 6 (six) hours as needed for wheezing or shortness of breath. 8 g 5   antiseptic oral rinse (BIOTENE) LIQD 15 mLs by Mouth Rinse route 3 (three) times daily as needed for dry mouth or mouth pain.     apixaban (ELIQUIS) 5 MG TABS tablet Place 1 tablet (5 mg total) into feeding tube 2 (two) times daily. 42 tablet 0   baclofen (LIORESAL) 10 MG tablet Take 5 mg by mouth 3 (three) times daily.     bisacodyl (DULCOLAX) 10 MG suppository Place 1 suppository (10 mg total) rectally as needed for moderate constipation. 12 suppository 0   brimonidine-timolol (COMBIGAN) 0.2-0.5 % ophthalmic solution Place 1 drop into both eyes every 12 (twelve) hours.     carboxymethylcellulose (REFRESH PLUS) 0.5 % SOLN Place 1 drop into both eyes 3 (three) times daily as needed (Dry eyes). RETAIN eye drops instead of refresh     cephALEXin (KEFLEX) 500 MG capsule Take 1 capsule (500 mg total) by mouth 3 (three) times daily. 30 capsule 0   cholecalciferol (CHOLECALCIFEROL) 25 MCG tablet Place 1 tablet (1,000 Units total) into feeding tube daily.     Cholecalciferol (VITAMIN D-3 PO) Take 1 tablet by mouth every morning.     clorazepate (TRANXENE) 7.5 MG tablet Place 1 tablet (7.5 mg total) into feeding tube 2  (two) times daily as needed for anxiety. 60 tablet 1   desvenlafaxine (PRISTIQ) 100 MG 24 hr tablet Take 1 tablet (100 mg total) by mouth at bedtime. 90 tablet 3   diltiazem (CARDIZEM) 60 MG tablet Place 1 tablet (60 mg total) into feeding tube every 8 (eight) hours. 90 tablet 3   ferrous sulfate 300 (60 Fe) MG/5ML syrup Place 5 mLs (300 mg total) into feeding tube daily with breakfast. 150 mL 3  fluticasone (FLONASE) 50 MCG/ACT nasal spray Place 2 sprays into both nostrils daily. 16 g 6   gabapentin (NEURONTIN) 100 MG capsule TAKE 1 CAPSULE(100 MG) BY MOUTH THREE TIMES DAILY 90 capsule 5   hydrALAZINE (APRESOLINE) 100 MG tablet Place 1 tablet (100 mg total) into feeding tube 3 (three) times daily. 90 tablet 3   isosorbide dinitrate (ISORDIL) 10 MG tablet Place 1 tablet (10 mg total) into feeding tube 3 (three) times daily. 90 tablet 3   latanoprost (XALATAN) 0.005 % ophthalmic solution Place 1 drop into both eyes at bedtime.     melatonin 5 MG TABS Place 2 tablets (10 mg total) into feeding tube at bedtime.  0   Multiple Vitamins-Minerals (MULTIVITAMIN WITH MINERALS) tablet Place 1 tablet into feeding tube daily.     Nutritional Supplements (FEEDING SUPPLEMENT, OSMOLITE 1.5 CAL,) LIQD 60 mLs by Per J Tube route continuous.     Omega 3 1200 MG CAPS Place 2 capsules (2,400 mg total) into feeding tube at bedtime.     omeprazole (PRILOSEC) 40 MG capsule Take 1 capsule (40 mg total) by mouth daily. 90 capsule 3   polyethylene glycol (MIRALAX) 17 g packet Take 17 g by mouth 2 (two) times daily. 14 each 0   senna (SENOKOT) 8.6 MG tablet Place 1 tablet (8.6 mg total) into feeding tube daily.     valbenazine (INGREZZA) 60 MG capsule Take 1 capsule (60 mg total) by mouth daily. (Patient taking differently: Patient takes 40 mg by mouth daily.) 30 capsule 11   Water For Irrigation, Sterile (FREE WATER) SOLN Place 250 mLs into feeding tube every 6 (six) hours.     No current facility-administered medications  on file prior to visit.        ROS:  All others reviewed and negative.  Objective        PE:  BP 116/68   Pulse (!) 52   Temp (!) 97.5 F (36.4 C) (Oral)   Ht 5\' 6"  (1.676 m)   SpO2 98%   BMI 29.85 kg/m                 Constitutional: Pt appears in NAD               HENT: Head: NCAT.                Right Ear: External ear normal.                 Left Ear: External ear normal.                Eyes: . Pupils are equal, round, and reactive to light. Conjunctivae and EOM are erythematous with weepy off color d/c, and mild upper and lower lids bilateral with mild swelling               Nose: without d/c or deformity               Neck: Neck supple. Gross normal ROM               Cardiovascular: Normal rate and regular rhythm.                 Pulmonary/Chest: Effort normal and breath sounds without rales or wheezing.                Abd:  Soft, NT, ND, + BS, no organomegaly  Neurological: Pt is alert. At baseline orientation, motor grossly intact               Skin: Skin is warm. No rashes, no other new lesions, LE edema - none               Psychiatric: Pt behavior is normal without agitation   Micro: none  Cardiac tracings I have personally interpreted today:  none  Pertinent Radiological findings (summarize): none   Lab Results  Component Value Date   WBC 6.3 11/21/2022   HGB 13.6 11/21/2022   HCT 41.4 11/21/2022   PLT 212 11/21/2022   GLUCOSE 145 (H) 11/21/2022   CHOL 174 11/06/2022   TRIG 224.0 (H) 11/06/2022   HDL 39.00 (L) 11/06/2022   LDLDIRECT 64.0 11/06/2022   LDLCALC 43 10/10/2021   ALT 36 11/21/2022   AST 30 11/21/2022   NA 138 11/21/2022   K 3.7 11/21/2022   CL 101 11/21/2022   CREATININE 0.92 11/21/2022   BUN 15 11/21/2022   CO2 27 11/21/2022   TSH 3.69 11/06/2022   INR 1.4 (H) 11/21/2022   HGBA1C 5.7 11/06/2022   MICROALBUR 1.0 10/06/2020   Assessment/Plan:  Tara [name] is a 72 y.o. White or Caucasian [1] female with  has a past  medical history of Anemia, ANKLE PAIN, LEFT (04/01/2008), ANXIETY (04/17/2007), Arthritis, Cataract, Chronic kidney disease, Colon polyp, COLONIC POLYPS, HX OF (08/01/2007), CONTUSIONS, MULTIPLE (04/01/2009), DEPRESSION (04/17/2007), DIZZINESS (08/01/2007), DYSPNEA (08/01/2007), Enlargement of lymph nodes (08/13/2007), Excessive involuntary blinking, Glaucoma, GLUCOSE INTOLERANCE (08/01/2007), Hypercalcemia due to sarcoidosis (2014), HYPERLIPIDEMIA (08/01/2007), HYPERSOMNIA (07/28/2008), HYPERTENSION (04/17/2007), Impaired glucose tolerance (03/23/2011), JOINT EFFUSION, LEFT KNEE (06/02/2010), Loose body in knee (04/01/2009), Metabolic encephalopathy (12/09/2015-12/2015), Migraines, Morbid obesity (HCC) (04/20/2007), OTHER DISEASES OF LUNG NOT ELSEWHERE CLASSIFIED (08/01/2007), Pain in joint, lower leg (04/01/2009), PERIPHERAL EDEMA (04/21/2009), Pre-diabetes, Sarcoidosis (09/25/2007), Seasonal allergies, SHOULDER PAIN, LEFT (04/01/2009), and Sleep apnea.  Allergic conjunctivitis and rhinitis Mild to mod, for depomedrol 80 mg IM,  prednisone taper, restart allegra and nasacort otc prn, also for antibx course as can't r/o superinfeciton,  to f/u any worsening symptoms or concerns  CKD (chronic kidney disease) stage 4, GFR 15-29 ml/min (HCC) Lab Results  Component Value Date   CREATININE 0.92 11/21/2022   Stable overall, cont to avoid nephrotoxins  Essential hypertension BP Readings from Last 3 Encounters:  01/29/23 116/68  01/02/23 (!) 140/88  11/29/22 (!) 140/78   Stable, pt to continue medical treatment card 60 mg every 8 hrs, hydralzine 100 tid   Steroid-induced diabetes Lab Results  Component Value Date   HGBA1C 5.7 11/06/2022   Stable, pt to continue current medical treatment  - diet, wt control  Followup: Return if symptoms worsen or fail to improve.  Oliver Barre, MD 01/31/2023 8:55 PM [facility] Internal Medicine

## 2023-01-31 ENCOUNTER — Encounter: Payer: Self-pay | Admitting: Internal Medicine

## 2023-01-31 NOTE — Assessment & Plan Note (Signed)
Lab Results  Component Value Date   HGBA1C 5.7 11/06/2022   Stable, pt to continue current medical treatment - diet, wt control

## 2023-01-31 NOTE — Assessment & Plan Note (Signed)
Lab Results  Component Value Date   CREATININE 0.92 11/21/2022   Stable overall, cont to avoid nephrotoxins

## 2023-01-31 NOTE — Assessment & Plan Note (Signed)
BP Readings from Last 3 Encounters:  01/29/23 116/68  01/02/23 (!) 140/88  11/29/22 (!) 140/78   Stable, pt to continue medical treatment card 60 mg every 8 hrs, hydralzine 100 tid

## 2023-01-31 NOTE — Assessment & Plan Note (Addendum)
Mild to mod, for depomedrol 80 mg IM,  prednisone taper, restart allegra and nasacort otc prn, also for antibx course as can't r/o superinfeciton,  to f/u any worsening symptoms or concerns

## 2023-02-02 ENCOUNTER — Other Ambulatory Visit: Payer: Self-pay | Admitting: Internal Medicine

## 2023-02-07 ENCOUNTER — Telehealth: Payer: Self-pay | Admitting: Internal Medicine

## 2023-02-07 NOTE — Telephone Encounter (Signed)
Placed on Providers desk. 

## 2023-02-07 NOTE — Telephone Encounter (Signed)
Pt's caregiver, Georgana Curio, dropped of a Referral form for [facility] infusion services, and it has been placed in Dr. Raphael Gibney box.   Please fax to:  [phone]  Please also call Janine Limbo for pick up:  [phone]

## 2023-02-07 NOTE — Telephone Encounter (Signed)
Mindi Junker from [facility] called wanting extended  Occupational Therapy for pt  1 week 1 2 week 1 Call back number [phone]

## 2023-02-07 NOTE — Telephone Encounter (Signed)
Ok for verbals 

## 2023-02-07 NOTE — Telephone Encounter (Signed)
Called and gave verbals.

## 2023-02-11 NOTE — Telephone Encounter (Signed)
Tara [name] called to check on the status of the form. She would like a call back at [phone].

## 2023-02-13 ENCOUNTER — Encounter (INDEPENDENT_AMBULATORY_CARE_PROVIDER_SITE_OTHER): Payer: Self-pay

## 2023-02-13 NOTE — Telephone Encounter (Signed)
Kiara, patient's caregiver, said this form is urgent and would like to know the status. She would like a call back at [phone].

## 2023-02-14 NOTE — Telephone Encounter (Signed)
Called [facility] and spoke to Faith Rogue, who stated that their a pharamcy who dose homes service infusion and offer supplies for pt such feeding J Tubes for pt who needs it. Stated pt care giver contact them that they need a new supply for ptWilleen [name]) pumps and bags due to their current supplier to providing it.    Called pt caregiver and clarify if that is correct, she stated yes, because [facility] has not been contacting them about supply and when she reach out to them, they seems to be nobody picking up, that why they have decide to switch supplier because they can not reuse the bag since they have been doing it for the past two week. Also, [facility] let them be aware that pt pump and supply was being discontinue by [facility] ( this was also verify by me via the call I did with [facility]).   Issue is explained to provider and forms given to him.

## 2023-02-25 ENCOUNTER — Telehealth: Payer: Self-pay | Admitting: Internal Medicine

## 2023-02-25 NOTE — Telephone Encounter (Signed)
[facility] called asking about form that was brought on Wednesday - it was given to [facility].  They are needing it sent back to them ASAP - Please fax to [phone]  Needs demographics with it and current patient notes if you have current.  Please call Marlowe Kays - [phone]

## 2023-02-26 NOTE — Telephone Encounter (Signed)
Placed on providers desk

## 2023-02-27 NOTE — Telephone Encounter (Signed)
Papers have been faxed.  

## 2023-03-01 ENCOUNTER — Telehealth: Payer: Self-pay | Admitting: Internal Medicine

## 2023-03-01 NOTE — Telephone Encounter (Signed)
Tara [name] with [facility] called states that [facility] needs more information regarding the splint ordered for her left foot. States they need face to face notes and a statement of medical necessity. States it is needed due to a contracture of the left foot and it will improve patients safety with gait and transfer. States the information can be faxed to [facility] at [phone].

## 2023-03-04 ENCOUNTER — Other Ambulatory Visit: Payer: Self-pay | Admitting: Internal Medicine

## 2023-03-04 NOTE — Telephone Encounter (Signed)
Faxed ov notes & letter necessity to [facility] at [phone]...Raechel Chute

## 2023-03-04 NOTE — Telephone Encounter (Signed)
Ok office note and Letter of necessity done hardcopy to [facility]

## 2023-03-05 ENCOUNTER — Telehealth: Payer: Self-pay | Admitting: Internal Medicine

## 2023-03-05 MED ORDER — ISOSORBIDE DINITRATE 10 MG PO TABS: 10.0000 mg | ORAL_TABLET | Freq: Three times a day (TID) | ORAL | 3 refills | Status: DC

## 2023-03-05 NOTE — Telephone Encounter (Signed)
A refill was requested for isosorbide dinitrate (ISORDIL) 10 MG tablet and was denied by the provider, but the patient is out of the medication and needs a refill. Patient is scheduled for an appointment on 03/07/23. Best callback is [phone].

## 2023-03-05 NOTE — Telephone Encounter (Signed)
Sent refill to [facility]../lmb 

## 2023-03-06 ENCOUNTER — Telehealth: Payer: Self-pay | Admitting: Internal Medicine

## 2023-03-06 NOTE — Telephone Encounter (Signed)
HH ORDERS   Caller Name: Lia(PT) Home Health Agency Name: [facility] Callback Phone #: [phone])  Service Requested:   Nursing to train pt on g-tube feeding and flushing   Speech therapy to evaluate pt's swallow

## 2023-03-06 NOTE — Telephone Encounter (Signed)
Ok for verbals 

## 2023-03-07 ENCOUNTER — Ambulatory Visit (INDEPENDENT_AMBULATORY_CARE_PROVIDER_SITE_OTHER): Payer: Medicare Other | Admitting: Internal Medicine

## 2023-03-07 VITALS — BP 130/78 | HR 88 | Temp 97.9°F | Ht 66.0 in

## 2023-03-07 DIAGNOSIS — J309 Allergic rhinitis, unspecified: Secondary | ICD-10-CM

## 2023-03-07 DIAGNOSIS — H1013 Acute atopic conjunctivitis, bilateral: Secondary | ICD-10-CM

## 2023-03-07 DIAGNOSIS — R519 Headache, unspecified: Secondary | ICD-10-CM

## 2023-03-07 DIAGNOSIS — G8929 Other chronic pain: Secondary | ICD-10-CM | POA: Diagnosis not present

## 2023-03-07 DIAGNOSIS — E46 Unspecified protein-calorie malnutrition: Secondary | ICD-10-CM

## 2023-03-07 DIAGNOSIS — E099 Drug or chemical induced diabetes mellitus without complications: Secondary | ICD-10-CM | POA: Diagnosis not present

## 2023-03-07 DIAGNOSIS — R8290 Unspecified abnormal findings in urine: Secondary | ICD-10-CM | POA: Diagnosis not present

## 2023-03-07 DIAGNOSIS — R1310 Dysphagia, unspecified: Secondary | ICD-10-CM

## 2023-03-07 DIAGNOSIS — T380X5D Adverse effect of glucocorticoids and synthetic analogues, subsequent encounter: Secondary | ICD-10-CM | POA: Diagnosis not present

## 2023-03-07 LAB — CBC WITH DIFFERENTIAL/PLATELET
Basophils Absolute: 0.1 10*3/uL (ref 0.0–0.1)
Basophils Relative: 0.7 % (ref 0.0–3.0)
Eosinophils Absolute: 0.2 10*3/uL (ref 0.0–0.7)
Eosinophils Relative: 1.9 % (ref 0.0–5.0)
HCT: 46.1 % — ABNORMAL HIGH (ref 36.0–46.0)
Hemoglobin: 14.8 g/dL (ref 12.0–15.0)
Lymphocytes Relative: 20.5 % (ref 12.0–46.0)
Lymphs Abs: 1.7 10*3/uL (ref 0.7–4.0)
MCHC: 32.1 g/dL (ref 30.0–36.0)
MCV: 89.1 fl (ref 78.0–100.0)
Monocytes Absolute: 0.7 10*3/uL (ref 0.1–1.0)
Monocytes Relative: 8.9 % (ref 3.0–12.0)
Neutro Abs: 5.8 10*3/uL (ref 1.4–7.7)
Neutrophils Relative %: 68 % (ref 43.0–77.0)
Platelets: 228 10*3/uL (ref 150.0–400.0)
RBC: 5.17 Mil/uL — ABNORMAL HIGH (ref 3.87–5.11)
RDW: 15.3 % (ref 11.5–15.5)
WBC: 8.5 10*3/uL (ref 4.0–10.5)

## 2023-03-07 LAB — BASIC METABOLIC PANEL
BUN: 23 mg/dL (ref 6–23)
CO2: 32 meq/L (ref 19–32)
Calcium: 9.7 mg/dL (ref 8.4–10.5)
Chloride: 100 meq/L (ref 96–112)
Creatinine, Ser: 0.99 mg/dL (ref 0.40–1.20)
GFR: 57.87 mL/min — ABNORMAL LOW (ref >60.00)
Glucose, Bld: 80 mg/dL (ref 70–99)
Potassium: 3.9 meq/L (ref 3.5–5.1)
Sodium: 139 meq/L (ref 135–145)

## 2023-03-07 LAB — LIPID PANEL
Cholesterol: 143 mg/dL (ref 0–200)
HDL: 39.4 mg/dL (ref >39.00)
NonHDL: 103.2
Total CHOL/HDL Ratio: 4
Triglycerides: 208 mg/dL — ABNORMAL HIGH (ref 0.0–149.0)
VLDL: 41.6 mg/dL — ABNORMAL HIGH (ref 0.0–40.0)

## 2023-03-07 LAB — HEPATIC FUNCTION PANEL
ALT: 35 U/L (ref 0–35)
AST: 31 U/L (ref 0–37)
Albumin: 4 g/dL (ref 3.5–5.2)
Alkaline Phosphatase: 105 U/L (ref 39–117)
Bilirubin, Direct: 0.1 mg/dL (ref 0.0–0.3)
Total Bilirubin: 0.5 mg/dL (ref 0.2–1.2)
Total Protein: 6.9 g/dL (ref 6.0–8.3)

## 2023-03-07 LAB — HEMOGLOBIN A1C: Hgb A1c MFr Bld: 5.3 % (ref 4.6–6.5)

## 2023-03-07 LAB — LDL CHOLESTEROL, DIRECT: Direct LDL: 59 mg/dL

## 2023-03-07 MED ORDER — METHYLPREDNISOLONE ACETATE 80 MG/ML IJ SUSP
80.0000 mg | Freq: Once | INTRAMUSCULAR | Status: AC
Start: 2023-03-07 — End: 2023-03-07
  Administered 2023-03-07: 80 mg via INTRAMUSCULAR

## 2023-03-07 MED ORDER — CIPROFLOXACIN HCL 500 MG PO TABS: 500.0000 mg | ORAL_TABLET | Freq: Two times a day (BID) | ORAL | 0 refills | Status: AC

## 2023-03-07 MED ORDER — KETOROLAC TROMETHAMINE 30 MG/ML IJ SOLN
30.0000 mg | Freq: Once | INTRAMUSCULAR | Status: AC
Start: 2023-03-07 — End: 2023-03-07
  Administered 2023-03-07: 30 mg via INTRAMUSCULAR

## 2023-03-07 NOTE — Progress Notes (Signed)
Patient ID: Tara [name], female   DOB: June 10, 1951, 72 y.o.   MRN: [id number]        Chief Complaint: follow up dysphagia, s/p PEG and nutrition, uti, HA, allergies       HPI:  Tara [name] is a 72 y.o. female here with private female caretaker, overall not doing as well recently, with s/s of UTI with dysuria and frequency, fatigue and feeling ill.  Denies urinary symptoms such as urgency, flank pain, hematuria or n/v, fever, chills.  Unable to leave sample today.  Also with flare of acute on chronic headache,  also Does have several wks ongoing nasal allergy symptoms with clearish congestion, itch and sneezing, without fever, pain, ST, cough, swelling or wheezing.  Needs f/u for nutrition as wellwith some questions per [facility] and feedings we are not able to answer here.  Has hx of neurosarcoid and dysphagia, still with signficant dysphagia.       Wt Readings from Last 3 Encounters:  11/20/22 184 lb 15.5 oz (83.9 kg)  11/06/22 185 lb (83.9 kg)  08/30/22 179 lb (81.2 kg)   BP Readings from Last 3 Encounters:  03/07/23 130/78  01/29/23 116/68  01/02/23 (!) 140/88         Past Medical History:  Diagnosis Date   Anemia    on meds   ANKLE PAIN, LEFT 04/01/2008   ANXIETY 04/17/2007   on meds   Arthritis    generalized   Cataract    bilateral sx   Chronic kidney disease    stage 3   Colon polyp    COLONIC POLYPS, HX OF 08/01/2007   CONTUSIONS, MULTIPLE 04/01/2009   DEPRESSION 04/17/2007   on meds   DIZZINESS 08/01/2007   DYSPNEA 08/01/2007   with exertion   Enlargement of lymph nodes 08/13/2007   Excessive involuntary blinking    per pt,going on since 2018   Glaucoma    on meds   GLUCOSE INTOLERANCE 08/01/2007   Hypercalcemia due to sarcoidosis 2014   HYPERLIPIDEMIA 08/01/2007   no meds   HYPERSOMNIA 07/28/2008   HYPERTENSION 04/17/2007   Impaired glucose tolerance 03/23/2011   JOINT EFFUSION, LEFT KNEE 06/02/2010   Loose body in knee 04/01/2009   pt not sure?   Metabolic  encephalopathy 12/09/2015-12/2015   Migraines    "stopped 3-4 yr ago" (07/23/2012)   Morbid obesity (HCC) 04/20/2007   OTHER DISEASES OF LUNG NOT ELSEWHERE CLASSIFIED 08/01/2007   Pain in joint, lower leg 04/01/2009   PERIPHERAL EDEMA 04/21/2009   Pre-diabetes    Sarcoidosis 09/25/2007   Seasonal allergies    SHOULDER PAIN, LEFT 04/01/2009   Sleep apnea    does not use Cpap   Past Surgical History:  Procedure Laterality Date   BREAST SURGERY     Biopsy benign/bil breaST   CARDIOVERSION N/A 12/15/2021   Procedure: CARDIOVERSION;  Surgeon: Chilton Si, MD;  Location: [facility] ENDOSCOPY;  Service: Cardiovascular;  Laterality: N/A;   CARDIOVERSION N/A 02/09/2022   Procedure: CARDIOVERSION;  Surgeon: Chrystie Nose, MD;  Location: [facility] END[facility];  Service: Cardiovascular;  Laterality: N/A;   CATARACT EXTRACTION     RIGHT EYE   COLONOSCOPY  2020   KN-MAC-suprep(good)-tics/TA's   COMBINED MEDIASTINOSCOPY AND BRONCHOSCOPY  08/2007   COMBINED MEDIASTINOSCOPY AND BRONCHOSCOPY  2009   Dental implant     FRACTURE SURGERY  ?02/1997   "left upper arm; put rod in" (07/23/2012)   GUM SURGERY  2000-?2009   "several ORs; soft tissue graft; took material  from roof of mouth" (07/23/2012   HUMERUS SURGERY Left 1998   rod insertion   IR GASTROSTOMY TUBE MOD SED  06/22/2022   IR REPLC GASTRO/COLONIC TUBE PERCUT W/FLUORO  10/25/2022   KNEE ARTHROSCOPY  03/2004; 06/2009   "right; left" Dr. Thomasena Edis   KNEE SURGERY  2012   ARTHROSCOPIC LEFT KNEE   LYMPH NODE BIOPSY  ~ 2009   "for sarcoidosis; don't know exactly which nodes" (07/23/2102)   MYOMECTOMY  1994   Open   POLYPECTOMY  2020   TA's   REFRACTIVE SURGERY  08/1998   "both eyes" (07/23/2012)   REFRACTIVE SURGERY  2000   Bil   TOTAL KNEE ARTHROPLASTY Left 09/24/2016   Procedure: TOTAL KNEE ARTHROPLASTY;  Surgeon: Jodi Geralds, MD;  Location: [facility] OR;  Service: Orthopedics;  Laterality: Left;    reports that she has never smoked. She has never used smokeless  tobacco. She reports that she does not currently use alcohol after a past usage of about 1.0 - 2.0 standard drink of alcohol per week. She reports that she does not use drugs. family history includes Asthma in her sister; Diabetes in her mother; Gout in her sister; Heart disease (age of onset: 29) in her father; Hypertension in her father, mother, and sister; Stroke in her mother. Allergies  Allergen Reactions   Sulfa Antibiotics Rash   Fluoxetine Hcl Other (See Comments)    (PROZAC) Suicidal thoughts   Current Outpatient Medications on File Prior to Visit  Medication Sig Dispense Refill   acetaminophen (TYLENOL) 650 MG CR tablet Take 650 mg by mouth every 8 (eight) hours as needed for pain.     albuterol (VENTOLIN HFA) 108 (90 Base) MCG/ACT inhaler Inhale 2 puffs into the lungs every 6 (six) hours as needed for wheezing or shortness of breath. 8 g 5   antiseptic oral rinse (BIOTENE) LIQD 15 mLs by Mouth Rinse route 3 (three) times daily as needed for dry mouth or mouth pain.     apixaban (ELIQUIS) 5 MG TABS tablet Place 1 tablet (5 mg total) into feeding tube 2 (two) times daily. 42 tablet 0   baclofen (LIORESAL) 10 MG tablet Take 5 mg by mouth 3 (three) times daily.     bisacodyl (DULCOLAX) 10 MG suppository Place 1 suppository (10 mg total) rectally as needed for moderate constipation. 12 suppository 0   brimonidine-timolol (COMBIGAN) 0.2-0.5 % ophthalmic solution Place 1 drop into both eyes every 12 (twelve) hours.     carboxymethylcellulose (REFRESH PLUS) 0.5 % SOLN Place 1 drop into both eyes 3 (three) times daily as needed (Dry eyes). RETAIN eye drops instead of refresh     cephALEXin (KEFLEX) 500 MG capsule Take 1 capsule (500 mg total) by mouth 3 (three) times daily. 30 capsule 0   cholecalciferol (CHOLECALCIFEROL) 25 MCG tablet Place 1 tablet (1,000 Units total) into feeding tube daily.     Cholecalciferol (VITAMIN D-3 PO) Take 1 tablet by mouth every morning.     clorazepate  (TRANXENE) 7.5 MG tablet PLACE 1 TABLET INTO FEEDING TUBE TWICE DAILY AS NEEDED FOR ANXIETY 60 tablet 2   desvenlafaxine (PRISTIQ) 100 MG 24 hr tablet Take 1 tablet (100 mg total) by mouth at bedtime. 90 tablet 3   diltiazem (CARDIZEM) 60 MG tablet Place 1 tablet (60 mg total) into feeding tube every 8 (eight) hours. 90 tablet 3   ferrous sulfate 300 (60 Fe) MG/5ML syrup Place 5 mLs (300 mg total) into feeding tube daily with breakfast.  150 mL 3   fluticasone (FLONASE) 50 MCG/ACT nasal spray Place 2 sprays into both nostrils daily. 16 g 6   gabapentin (NEURONTIN) 100 MG capsule TAKE 1 CAPSULE(100 MG) BY MOUTH THREE TIMES DAILY 90 capsule 5   hydrALAZINE (APRESOLINE) 100 MG tablet Place 1 tablet (100 mg total) into feeding tube 3 (three) times daily. 90 tablet 3   isosorbide dinitrate (ISORDIL) 10 MG tablet Place 1 tablet (10 mg total) into feeding tube 3 (three) times daily. 90 tablet 3   latanoprost (XALATAN) 0.005 % ophthalmic solution Place 1 drop into both eyes at bedtime.     melatonin 5 MG TABS Place 2 tablets (10 mg total) into feeding tube at bedtime.  0   Multiple Vitamins-Minerals (MULTIVITAMIN WITH MINERALS) tablet Place 1 tablet into feeding tube daily.     Nutritional Supplements (FEEDING SUPPLEMENT, OSMOLITE 1.5 CAL,) LIQD 60 mLs by Per J Tube route continuous.     Omega 3 1200 MG CAPS Place 2 capsules (2,400 mg total) into feeding tube at bedtime.     omeprazole (PRILOSEC) 40 MG capsule Take 1 capsule (40 mg total) by mouth daily. 90 capsule 3   polyethylene glycol (MIRALAX) 17 g packet Take 17 g by mouth 2 (two) times daily. 14 each 0   predniSONE (DELTASONE) 10 MG tablet 3 tabs by mouth per day for 3 days,2tabs per day for 3 days,1tab per day for 3 days 18 tablet 0   senna (SENOKOT) 8.6 MG tablet Place 1 tablet (8.6 mg total) into feeding tube daily.     valbenazine (INGREZZA) 60 MG capsule Take 1 capsule (60 mg total) by mouth daily. (Patient taking differently: Patient takes 40 mg  by mouth daily.) 30 capsule 11   Water For Irrigation, Sterile (FREE WATER) SOLN Place 250 mLs into feeding tube every 6 (six) hours.     zaleplon (SONATA) 10 MG capsule Take 20 mg by mouth at bedtime.     No current facility-administered medications on file prior to visit.        ROS:  All others reviewed and negative.  Objective        PE:  BP 130/78   Pulse 88   Temp 97.9 F (36.6 C) (Oral)   Ht 5\' 6"  (1.676 m)   SpO2 97%   BMI 29.85 kg/m                 Constitutional: Pt appears in NAD               HENT: Head: NCAT.                Right Ear: External ear normal.                 Left Ear: External ear normal.                Eyes: . Pupils are equal, round, and reactive to light. Conjunctivae and EOM are normal               Nose: without d/c or deformity               Neck: Neck supple. Gross normal ROM               Cardiovascular: Normal rate and regular rhythm.                 Pulmonary/Chest: Effort normal and breath sounds without rales or wheezing.  Abd:  Soft, NT, ND, + BS, no organomegaly               Neurological: Pt is alert. At baseline orientation, motor grossly intact               Skin: Skin is warm. No rashes, no other new lesions, LE edema - none               Psychiatric: Pt behavior is normal without agitation   Micro: none  Cardiac tracings I have personally interpreted today:  none  Pertinent Radiological findings (summarize): none   Lab Results  Component Value Date   WBC 8.5 03/07/2023   HGB 14.8 03/07/2023   HCT 46.1 (H) 03/07/2023   PLT 228.0 03/07/2023   GLUCOSE 80 03/07/2023   CHOL 143 03/07/2023   TRIG 208.0 (H) 03/07/2023   HDL 39.40 03/07/2023   LDLDIRECT 59.0 03/07/2023   LDLCALC 43 10/10/2021   ALT 35 03/07/2023   AST 31 03/07/2023   NA 139 03/07/2023   K 3.9 03/07/2023   CL 100 03/07/2023   CREATININE 0.99 03/07/2023   BUN 23 03/07/2023   CO2 32 03/07/2023   TSH 1.53 03/07/2023   INR 1.4 (H) 11/21/2022    HGBA1C 5.3 03/07/2023   MICROALBUR 1.0 10/06/2020   Assessment/Plan:  Nyomie Lozo is a 72 y.o. White or Caucasian [1] female with  has a past medical history of Anemia, ANKLE PAIN, LEFT (04/01/2008), ANXIETY (04/17/2007), Arthritis, Cataract, Chronic kidney disease, Colon polyp, COLONIC POLYPS, HX OF (08/01/2007), CONTUSIONS, MULTIPLE (04/01/2009), DEPRESSION (04/17/2007), DIZZINESS (08/01/2007), DYSPNEA (08/01/2007), Enlargement of lymph nodes (08/13/2007), Excessive involuntary blinking, Glaucoma, GLUCOSE INTOLERANCE (08/01/2007), Hypercalcemia due to sarcoidosis (2014), HYPERLIPIDEMIA (08/01/2007), HYPERSOMNIA (07/28/2008), HYPERTENSION (04/17/2007), Impaired glucose tolerance (03/23/2011), JOINT EFFUSION, LEFT KNEE (06/02/2010), Loose body in knee (04/01/2009), Metabolic encephalopathy (12/09/2015-12/2015), Migraines, Morbid obesity (HCC) (04/20/2007), OTHER DISEASES OF LUNG NOT ELSEWHERE CLASSIFIED (08/01/2007), Pain in joint, lower leg (04/01/2009), PERIPHERAL EDEMA (04/21/2009), Pre-diabetes, Sarcoidosis (09/25/2007), Seasonal allergies, SHOULDER PAIN, LEFT (04/01/2009), and Sleep apnea.  Chronic headaches With acute on chronic - for f/u HA clinic but today for toradol 30 mg IM  Abnormal urine odor With high suspicion for uti, unable to give specimen without I and O cath not able to do here, for empiric cipro 500 bid course  Allergic conjunctivitis and rhinitis Mild to mod, for depomedrol Im 80 mg  to f/u any worsening symptoms or concerns  Steroid-induced diabetes Lab Results  Component Value Date   HGBA1C 5.3 03/07/2023   Stable, pt to continue current medical treatment  - diet, wt control   Malnutrition (HCC) Also for nutrition referral  Dysphagia Also for GI referral, ST consult  Followup: Return in about 6 months (around 09/07/2023).  Oliver Barre, MD 03/09/2023 5:20 PM [facility]

## 2023-03-07 NOTE — Patient Instructions (Signed)
You had the toradol pain shot today, and the depomedrol steroid shot today  Please take all new medication as prescribed - -the cipro antibiotic  Please continue all other medications as before, and refills have been done if requested.  Please have the pharmacy call with any other refills you may need.  Please keep your appointments with your specialists as you may have planned  You will be contacted regarding the referral for: Gastroenterology, Speech Therapy and Nutrition  Please go to the LAB at the blood drawing area for the tests to be done  You will be contacted by phone if any changes need to be made immediately.  Otherwise, you will receive a letter about your results with an explanation, but please check with MyChart first.  Please make an Appointment to return in 6 months, or sooner if needed

## 2023-03-07 NOTE — Telephone Encounter (Signed)
Called and gave verbals on voicemail.

## 2023-03-08 LAB — TSH: TSH: 1.53 u[IU]/mL (ref 0.35–5.50)

## 2023-03-08 LAB — PREALBUMIN: Prealbumin: 36 mg/dL — ABNORMAL HIGH (ref 17–34)

## 2023-03-08 NOTE — Progress Notes (Signed)
The test results show that your current treatment is OK, as the tests are stable.  Please continue the same plan.  There is no other need for change of treatment or further evaluation based on these results, at this time.  thanks 

## 2023-03-09 ENCOUNTER — Encounter: Payer: Self-pay | Admitting: Internal Medicine

## 2023-03-09 DIAGNOSIS — E46 Unspecified protein-calorie malnutrition: Secondary | ICD-10-CM | POA: Insufficient documentation

## 2023-03-09 DIAGNOSIS — R1310 Dysphagia, unspecified: Secondary | ICD-10-CM | POA: Insufficient documentation

## 2023-03-09 NOTE — Assessment & Plan Note (Signed)
With high suspicion for uti, unable to give specimen without I and O cath not able to do here, for empiric cipro 500 bid course

## 2023-03-09 NOTE — Assessment & Plan Note (Signed)
With acute on chronic - for f/u HA clinic but today for toradol 30 mg IM

## 2023-03-09 NOTE — Assessment & Plan Note (Signed)
Also for nutrition referral

## 2023-03-09 NOTE — Assessment & Plan Note (Signed)
Mild to mod, for depomedrol I'm 80 mg  to f/u any worsening symptoms or concerns

## 2023-03-09 NOTE — Assessment & Plan Note (Addendum)
Also for GI referral, ST consult

## 2023-03-09 NOTE — Assessment & Plan Note (Signed)
Lab Results  Component Value Date   HGBA1C 5.3 03/07/2023   Stable, pt to continue current medical treatment  - diet, wt control

## 2023-03-13 ENCOUNTER — Encounter (HOSPITAL_COMMUNITY): Payer: Self-pay

## 2023-03-13 ENCOUNTER — Emergency Department (HOSPITAL_COMMUNITY): Payer: Medicare Other

## 2023-03-13 ENCOUNTER — Emergency Department (HOSPITAL_COMMUNITY)
Admission: EM | Admit: 2023-03-13 | Discharge: 2023-03-13 | Disposition: A | Discharge status: Home / Self Care | Payer: Medicare Other | Attending: Student | Admitting: Student

## 2023-03-13 ENCOUNTER — Other Ambulatory Visit: Payer: Self-pay

## 2023-03-13 DIAGNOSIS — I129 Hypertensive chronic kidney disease with stage 1 through stage 4 chronic kidney disease, or unspecified chronic kidney disease: Secondary | ICD-10-CM | POA: Diagnosis not present

## 2023-03-13 DIAGNOSIS — N889 Noninflammatory disorder of cervix uteri, unspecified: Secondary | ICD-10-CM | POA: Diagnosis not present

## 2023-03-13 DIAGNOSIS — R9389 Abnormal findings on diagnostic imaging of other specified body structures: Secondary | ICD-10-CM

## 2023-03-13 DIAGNOSIS — N898 Other specified noninflammatory disorders of vagina: Secondary | ICD-10-CM | POA: Diagnosis present

## 2023-03-13 DIAGNOSIS — Z96652 Presence of left artificial knee joint: Secondary | ICD-10-CM | POA: Diagnosis not present

## 2023-03-13 DIAGNOSIS — E1122 Type 2 diabetes mellitus with diabetic chronic kidney disease: Secondary | ICD-10-CM | POA: Insufficient documentation

## 2023-03-13 DIAGNOSIS — R300 Dysuria: Secondary | ICD-10-CM | POA: Diagnosis not present

## 2023-03-13 DIAGNOSIS — Z7901 Long term (current) use of anticoagulants: Secondary | ICD-10-CM | POA: Insufficient documentation

## 2023-03-13 DIAGNOSIS — N390 Urinary tract infection, site not specified: Secondary | ICD-10-CM | POA: Insufficient documentation

## 2023-03-13 DIAGNOSIS — N184 Chronic kidney disease, stage 4 (severe): Secondary | ICD-10-CM | POA: Diagnosis not present

## 2023-03-13 DIAGNOSIS — N888 Other specified noninflammatory disorders of cervix uteri: Secondary | ICD-10-CM | POA: Diagnosis not present

## 2023-03-13 LAB — URINALYSIS, ROUTINE W REFLEX MICROSCOPIC
Bilirubin Urine: NEGATIVE
Glucose, UA: NEGATIVE mg/dL
Hgb urine dipstick: NEGATIVE
Ketones, ur: NEGATIVE mg/dL
Nitrite: NEGATIVE
Protein, ur: NEGATIVE mg/dL
Specific Gravity, Urine: 1.014 (ref 1.005–1.030)
WBC, UA: >50 WBC/hpf (ref 0–5)
pH: 6 (ref 5.0–8.0)

## 2023-03-13 LAB — COMPREHENSIVE METABOLIC PANEL
ALT: 38 U/L (ref 0–44)
AST: 27 U/L (ref 15–41)
Albumin: 3.8 g/dL (ref 3.5–5.0)
Alkaline Phosphatase: 101 U/L (ref 38–126)
Anion gap: 11 (ref 5–15)
BUN: 20 mg/dL (ref 8–23)
CO2: 25 mmol/L (ref 22–32)
Calcium: 9.6 mg/dL (ref 8.9–10.3)
Chloride: 101 mmol/L (ref 98–111)
Creatinine, Ser: 1.07 mg/dL — ABNORMAL HIGH (ref 0.44–1.00)
GFR, Estimated: 56 mL/min — ABNORMAL LOW (ref >60)
Glucose, Bld: 94 mg/dL (ref 70–99)
Potassium: 3.4 mmol/L — ABNORMAL LOW (ref 3.5–5.1)
Sodium: 137 mmol/L (ref 135–145)
Total Bilirubin: 0.7 mg/dL (ref 0.3–1.2)
Total Protein: 6.8 g/dL (ref 6.5–8.1)

## 2023-03-13 LAB — CBC WITH DIFFERENTIAL/PLATELET
Abs Immature Granulocytes: 0.11 10*3/uL — ABNORMAL HIGH (ref 0.00–0.07)
Basophils Absolute: 0.1 10*3/uL (ref 0.0–0.1)
Basophils Relative: 0 %
Eosinophils Absolute: 0.1 10*3/uL (ref 0.0–0.5)
Eosinophils Relative: 1 %
HCT: 45.4 % (ref 36.0–46.0)
Hemoglobin: 14.7 g/dL (ref 12.0–15.0)
Immature Granulocytes: 1 %
Lymphocytes Relative: 7 %
Lymphs Abs: 1.4 10*3/uL (ref 0.7–4.0)
MCH: 28.5 pg (ref 26.0–34.0)
MCHC: 32.4 g/dL (ref 30.0–36.0)
MCV: 88.2 fL (ref 80.0–100.0)
Monocytes Absolute: 1.4 10*3/uL — ABNORMAL HIGH (ref 0.1–1.0)
Monocytes Relative: 7 %
Neutro Abs: 16.9 10*3/uL — ABNORMAL HIGH (ref 1.7–7.7)
Neutrophils Relative %: 84 %
Platelets: 221 10*3/uL (ref 150–400)
RBC: 5.15 MIL/uL — ABNORMAL HIGH (ref 3.87–5.11)
RDW: 14.6 % (ref 11.5–15.5)
WBC: 19.9 10*3/uL — ABNORMAL HIGH (ref 4.0–10.5)
nRBC: 0 % (ref 0.0–0.2)

## 2023-03-13 MED ORDER — SODIUM CHLORIDE 0.9 % IV SOLN
2.0000 g | Freq: Once | INTRAVENOUS | Status: AC
  Administered 2023-03-13: 2 g via INTRAVENOUS
  Filled 2023-03-13: qty 20

## 2023-03-13 MED ORDER — IOHEXOL 350 MG/ML SOLN
75.0000 mL | Freq: Once | INTRAVENOUS | Status: AC | PRN
  Administered 2023-03-13: 75 mL via INTRAVENOUS

## 2023-03-13 MED ORDER — FOSFOMYCIN TROMETHAMINE 3 G PO PACK
3.0000 g | PACK | Freq: Once | ORAL | Status: AC
  Administered 2023-03-13: 3 g via ORAL
  Filled 2023-03-13: qty 3

## 2023-03-13 NOTE — Discharge Instructions (Addendum)
Tara [name] was seen in the emergency room for evaluation of vaginal discharge.  On my exam, I did see a lesion on the cervix and this will need to be evaluated in the outpatient setting by the OB/GYN's.  She also does have a UTI of which we treated with a medication that she only needs to take 1 time in the ER and does not need to take persistent antibiotics outside the hospital.  Please call her OB/GYN for follow-up and I did send a message to Wyline Beady to help arrange outpatient follow-up.  Return to emergency room if she has new or worsening abdominal pain, worsening vaginal bleeding, vomiting or any other concerning symptoms.

## 2023-03-13 NOTE — ED Triage Notes (Signed)
Per EMS and pt's caregiver, pt is having a brown, malodorous discharge from her vagina and possibly her urethra. Has a hx of UTI's and is incontinent. Delayed response at baseline.

## 2023-03-13 NOTE — Progress Notes (Signed)
Patient ID: Dazariah Natter, female   DOB: Feb 14, 1951, 72 y.o.   MRN: [id number]   OB/GYN Telephone Consult  03/13/2023   Kynnedy Cody is a 72 y.o. G0P0 who is currently postmenopausal presenting to [facility].   I was called for a consult regarding the care of this patient by the Physician (MD/DO) caring for the patient.   The provider had the following clinical question: Thickened endometrium on u/s ? Cervical mass on exam - normal pap in 2021 in EPIC noted  The provider presented the following relevant clinical information: ? UTI No stool in vagina  I performed a chart review on the patient and reviewed available documentation.  BP 131/77   Pulse 79   Temp (!) 97.4 F (36.3 C) (Oral)   Resp 20   Ht 5\' 6"  (1.676 m)   Wt 84.4 kg   SpO2 97%   BMI 30.02 kg/m   Exam- performed by consulting provider   Recommendations:  -f/u with GYN of choice as outpt  -Recommended MD provide the patient with a referral to the [facility] (any office) or preferred provider for follow up in  4 weeks.   Thank you for this consult and if additional recommendations are needed please call [phone] for the OB/GYN attending on service at [facility].   I spent approximately 10 minutes directly consulting with the provider and verbally discussing this case. Additionally 10 minutes minutes was spent performing chart review and documentation.    Reva Bores, MD

## 2023-03-13 NOTE — ED Notes (Signed)
Spoke to pt caregiver on phone. Caregiver requesting PTAR for discharge.

## 2023-03-13 NOTE — ED Notes (Signed)
Patient transported to Ultrasound 

## 2023-03-14 ENCOUNTER — Telehealth: Payer: Self-pay | Admitting: *Deleted

## 2023-03-14 NOTE — Telephone Encounter (Signed)
Tara Mackie, NP  [facility] Triage This patient was seen in ER last night and found to have a thickened endometrium and also lesion on cervix. Please call patient to expedite scheduling an appt with MD for EMB and pap. Thanks.  Spoke with patients caregiver, Tara [name], ok per dpr. Was advised that patient is currently receiving physical therapy, uses a wheelchair and a mobile lift. Caregiver lives with her and accompanies her to her appts.Caregiver would assist patient to exam table and stay during her visit. States patient is able to hold her legs and bend them on her own. Does not have a provider that comes into the home. Advised I would like to review with provider and return call, Tara [name] agreeable.  Dr. Karma Greaser -ok to proceed with scheduling EMB with PAP in procedure room in 1 hour slot to allow additional time for patient to transfer to exam table? I have held 9/4 at 11am.   Cc: Tiffany, NP

## 2023-03-14 NOTE — Telephone Encounter (Signed)
Spoke with Janine Limbo, advised per Dr. Karma Greaser.  Tara [name] declined appt on 9/4, scheduled for 9/16 at 11am. Patient can not take Motrin, advised to take 2 reg tylenol 1 hour prior to procedure. Tara [name] verbalizes understanding and is agreeable.   Routing to [employer].

## 2023-03-14 NOTE — ED Provider Notes (Signed)
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 unspecified     @PCDICTATION @    Glendora Score, MD 03/14/23 1148

## 2023-03-18 ENCOUNTER — Other Ambulatory Visit: Payer: Self-pay | Admitting: Internal Medicine

## 2023-03-19 ENCOUNTER — Other Ambulatory Visit: Payer: Self-pay

## 2023-03-20 ENCOUNTER — Ambulatory Visit: Payer: Medicare Other | Admitting: Nurse Practitioner

## 2023-03-22 ENCOUNTER — Telehealth: Payer: Self-pay | Admitting: Internal Medicine

## 2023-03-22 NOTE — Telephone Encounter (Signed)
Place FL2 form in Dr. Jonny Ruiz box to be filled out. Please advised.

## 2023-03-22 NOTE — Telephone Encounter (Signed)
Placed on providers desk

## 2023-03-26 ENCOUNTER — Telehealth: Payer: Self-pay | Admitting: Internal Medicine

## 2023-03-26 ENCOUNTER — Other Ambulatory Visit: Payer: Self-pay

## 2023-03-26 MED ORDER — LATANOPROST 0.005 % OP SOLN: 1.0000 [drp] | Freq: Every day | OPHTHALMIC | 0 refills | Status: DC

## 2023-03-26 NOTE — Telephone Encounter (Signed)
Called and left voicemail, Dr.John is wanting to know what is the requested level of care for the form.

## 2023-03-26 NOTE — Telephone Encounter (Signed)
[facility] called for Verbal orders OT therapy once a week, skip a week then once a week for two weeks. Please call [facility] back.

## 2023-03-26 NOTE — Telephone Encounter (Signed)
Called and left voicemail giving verbals.

## 2023-03-26 NOTE — Telephone Encounter (Signed)
Prescription Request  03/26/2023  LOV: 03/07/2023  What is the name of the medication or equipment?  latanoprost (XALATAN) 0.005 % ophthalmic solution    Have you contacted your pharmacy to request a refill? No   Which pharmacy would you like this sent to?   Patient notified that their request is being [facility] #[id number] [place], [place] - [phone] AT [facility] OF [facility] & [address] [place] [address] Phone: [phone] Fax: [phone]  sent to the clinical staff for review and that they should receive a response within 2 business days.   Please advise at Mobile [phone] (mobile)

## 2023-03-26 NOTE — Telephone Encounter (Signed)
Refill sent.

## 2023-03-26 NOTE — Telephone Encounter (Signed)
Ok for verbals 

## 2023-03-28 ENCOUNTER — Telehealth: Payer: Self-pay

## 2023-03-28 DIAGNOSIS — G20C Parkinsonism, unspecified: Secondary | ICD-10-CM | POA: Insufficient documentation

## 2023-03-28 NOTE — Telephone Encounter (Signed)
Transition Care Management Follow-up Telephone Call Date of discharge and from where: [facility] How have you been since you were released from the hospital? Patient is doing much better and is following up with providers  Any questions or concerns? No  Items Reviewed: Did the pt receive and understand the discharge instructions provided? Yes  Medications obtained and verified? No  Other? No  Any new allergies since your discharge? No  Dietary orders reviewed? No Do you have support at home? Yes     Follow up appointments reviewed:  PCP Hospital f/u appt confirmed? No  Scheduled to see  on  @ . Specialist Hospital f/u appt confirmed? Yes  Scheduled to see Ob gyn on  @ . Are transportation arrangements needed? No  If their condition worsens, is the pt aware to call PCP or go to the Emergency Dept.? Yes Was the patient provided with contact information for the PCP's office or ED? Yes Was to pt encouraged to call back with questions or concerns? Yes

## 2023-03-29 NOTE — Telephone Encounter (Signed)
Called and let Tara [name] know paperwork has been placed up front.

## 2023-04-01 ENCOUNTER — Other Ambulatory Visit (HOSPITAL_COMMUNITY)
Admission: RE | Admit: 2023-04-01 | Discharge: 2023-04-01 | Disposition: A | Discharge status: Home / Self Care | Payer: Medicare Other | Source: Ambulatory Visit | Attending: Obstetrics and Gynecology | Admitting: Obstetrics and Gynecology

## 2023-04-01 ENCOUNTER — Telehealth: Payer: Self-pay

## 2023-04-01 ENCOUNTER — Ambulatory Visit (INDEPENDENT_AMBULATORY_CARE_PROVIDER_SITE_OTHER): Payer: Medicare Other | Admitting: Obstetrics and Gynecology

## 2023-04-01 ENCOUNTER — Encounter: Payer: Self-pay | Admitting: Obstetrics and Gynecology

## 2023-04-01 VITALS — BP 110/70 | HR 63

## 2023-04-01 DIAGNOSIS — Z01411 Encounter for gynecological examination (general) (routine) with abnormal findings: Secondary | ICD-10-CM | POA: Insufficient documentation

## 2023-04-01 DIAGNOSIS — N859 Noninflammatory disorder of uterus, unspecified: Secondary | ICD-10-CM | POA: Insufficient documentation

## 2023-04-01 MED ORDER — HYDRALAZINE HCL 100 MG PO TABS: 100.0000 mg | ORAL_TABLET | Freq: Three times a day (TID) | ORAL | 3 refills | Status: DC

## 2023-04-01 NOTE — Addendum Note (Signed)
Addended by: Earley Favor on: 04/01/2023 12:29 PM   Modules accepted: Orders

## 2023-04-01 NOTE — Telephone Encounter (Signed)
Patient caregiver, Tara [name], who is usually with patient during appointments, stopped by office as needed additional information on FL2 form.  Patient is going into facility to allow restpite for [facility]ivers in the coming weeks/months.  Updated form, okay by provider.   Also consulted PharmD on 2 capsale medications that could be switched to tablets to best be crusted and given via tube. At this time there are not medications in her current strength available via tablet. Spoke with patient caregiver and she stated will include in care plan for patient at facility as not to make big adjustments for a short time.   Appreciative of the assistance, no additional questions at this time

## 2023-04-01 NOTE — Progress Notes (Signed)
72 y.o. y.o. female here for ER follow-up for [place] with lining at 7mm.  She was seen in ER for UTI.  Ct scan with Reproductive: Uterus and bilateral adnexa are unremarkable.  Patient is here today with her caretaker. Patient denies any VB, pelvic pain or discharge  arrative & Impression  CLINICAL DATA:  Concern for cervical mass.   EXAM: TRANSABDOMINAL AND TRANSVAGINAL ULTRASOUND OF PELVIS   TECHNIQUE: Both transabdominal and transvaginal ultrasound examinations of the pelvis were performed. Transabdominal technique was performed for global imaging of the pelvis including uterus, ovaries, adnexal regions, and pelvic cul-de-sac. It was necessary to proceed with endovaginal exam following the transabdominal exam to visualize the endometrium and ovaries.   COMPARISON:  None Available.   FINDINGS: Evaluation is limited as the patient could not tolerate the transvaginal exam.   Uterus   Measurements: 7.3 x 2.2 x 3.9 cm = volume: 33 mL. A focus of calcification in the lower endometrium, likely sequela of prior insult or a small calcified fibroid.   Endometrium   Thickness: 7 mm. The endometrium is poorly visualized and suboptimally evaluated but appears thickened for a postmenopausal female. Findings may represent endometrial hyperplasia, polyp, or neoplasm. Further evaluation with hysteroscopy is recommended.   Right ovary   Not visualized.   Left ovary   Not visualized.   Other findings   No abnormal free fluid.   IMPRESSION: Limited exam. Thickened appearance of the endometrium. Further evaluation with hysteroscopy is recommended.     Electronically Signed   By: Elgie Collard M.D.   On: 03/13/2023 20:33    Blood pressure 110/70, pulse 63, SpO2 98%.  No results found for: "DIAGPAP", "HPVHIGH", "ADEQPAP"  GYN HISTORY: No results found for: "DIAGPAP", "HPVHIGH", "ADEQPAP"  OB History  Gravida Para Term Preterm AB Living  0            SAB IAB  Ectopic Multiple Live Births               Past Medical History:  Diagnosis Date   Anemia    on meds   ANKLE PAIN, LEFT 04/01/2008   ANXIETY 04/17/2007   on meds   Arthritis    generalized   Cataract    bilateral sx   Chronic kidney disease    stage 3   Colon polyp    COLONIC POLYPS, HX OF 08/01/2007   CONTUSIONS, MULTIPLE 04/01/2009   DEPRESSION 04/17/2007   on meds   DIZZINESS 08/01/2007   DYSPNEA 08/01/2007   with exertion   Enlargement of lymph nodes 08/13/2007   Excessive involuntary blinking    per pt,going on since 2018   Glaucoma    on meds   GLUCOSE INTOLERANCE 08/01/2007   Hypercalcemia due to sarcoidosis 2014   HYPERLIPIDEMIA 08/01/2007   no meds   HYPERSOMNIA 07/28/2008   HYPERTENSION 04/17/2007   Impaired glucose tolerance 03/23/2011   JOINT EFFUSION, LEFT KNEE 06/02/2010   Loose body in knee 04/01/2009   pt not sure?   Metabolic encephalopathy 12/09/2015-12/2015   Migraines    "stopped 3-4 yr ago" (07/23/2012)   Morbid obesity (HCC) 04/20/2007   OTHER DISEASES OF LUNG NOT ELSEWHERE CLASSIFIED 08/01/2007   Pain in joint, lower leg 04/01/2009   PERIPHERAL EDEMA 04/21/2009   Pre-diabetes    Sarcoidosis 09/25/2007   Seasonal allergies    SHOULDER PAIN, LEFT 04/01/2009   Sleep apnea    does not use Cpap    Past Surgical History:  Procedure  Laterality Date   BREAST SURGERY     Biopsy benign/bil breaST   CARDIOVERSION N/A 12/15/2021   Procedure: CARDIOVERSION;  Surgeon: Chilton Si, MD;  Location: [facility] ENDOSCOPY;  Service: Cardiovascular;  Laterality: N/A;   CARDIOVERSION N/A 02/09/2022   Procedure: CARDIOVERSION;  Surgeon: Chrystie Nose, MD;  Location: [facility] ENDOSCOPY;  Service: Cardiovascular;  Laterality: N/A;   CATARACT EXTRACTION     RIGHT EYE   COLONOSCOPY  2020   KN-MAC-suprep(good)-tics/TA's   COMBINED MEDIASTINOSCOPY AND BRONCHOSCOPY  08/2007   COMBINED MEDIASTINOSCOPY AND BRONCHOSCOPY  2009   Dental implant     FRACTURE SURGERY   ?02/1997   "left upper arm; put rod in" (07/23/2012)   GUM SURGERY  2000-?2009   "several ORs; soft tissue graft; took material from roof of mouth" (07/23/2012   HUMERUS SURGERY Left 1998   rod insertion   IR GASTROSTOMY TUBE MOD SED  06/22/2022   IR REPLC GASTRO/COLONIC TUBE PERCUT W/FLUORO  10/25/2022   KNEE ARTHROSCOPY  03/2004; 06/2009   "right; left" Dr. Thomasena Edis   KNEE SURGERY  2012   ARTHROSCOPIC LEFT KNEE   LYMPH NODE BIOPSY  ~ 2009   "for sarcoidosis; don't know exactly which nodes" (07/23/2102)   MYOMECTOMY  1994   Open   POLYPECTOMY  2020   TA's   REFRACTIVE SURGERY  08/1998   "both eyes" (07/23/2012)   REFRACTIVE SURGERY  2000   Bil   TOTAL KNEE ARTHROPLASTY Left 09/24/2016   Procedure: TOTAL KNEE ARTHROPLASTY;  Surgeon: Jodi Geralds, MD;  Location: [facility] OR;  Service: Orthopedics;  Laterality: Left;    Current Outpatient Medications on File Prior to Visit  Medication Sig Dispense Refill   acetaminophen (TYLENOL) 650 MG CR tablet Take 650 mg by mouth every 8 (eight) hours as needed for pain.     albuterol (VENTOLIN HFA) 108 (90 Base) MCG/ACT inhaler Inhale 2 puffs into the lungs every 6 (six) hours as needed for wheezing or shortness of breath. 8 g 5   amoxicillin (AMOXIL) 500 MG tablet Take 2,000 mg by mouth once.     antiseptic oral rinse (BIOTENE) LIQD 15 mLs by Mouth Rinse route 3 (three) times daily as needed for dry mouth or mouth pain.     apixaban (ELIQUIS) 5 MG TABS tablet Place 1 tablet (5 mg total) into feeding tube 2 (two) times daily. 42 tablet 0   baclofen (LIORESAL) 10 MG tablet Take 5 mg by mouth 3 (three) times daily.     bisacodyl (DULCOLAX) 10 MG suppository Place 1 suppository (10 mg total) rectally as needed for moderate constipation. 12 suppository 0   brimonidine-timolol (COMBIGAN) 0.2-0.5 % ophthalmic solution Place 1 drop into both eyes every 12 (twelve) hours.     cholecalciferol (CHOLECALCIFEROL) 25 MCG tablet Place 1 tablet (1,000 Units total) into feeding  tube daily.     Cholecalciferol (VITAMIN D-3 PO) Take 1 tablet by mouth every morning.     clorazepate (TRANXENE) 7.5 MG tablet PLACE 1 TABLET INTO FEEDING TUBE TWICE DAILY AS NEEDED FOR ANXIETY 60 tablet 2   desvenlafaxine (PRISTIQ) 100 MG 24 hr tablet Take 1 tablet (100 mg total) by mouth at bedtime. 90 tablet 3   fluticasone (FLONASE) 50 MCG/ACT nasal spray Place 2 sprays into both nostrils daily. 16 g 6   gabapentin (NEURONTIN) 100 MG capsule TAKE 1 CAPSULE(100 MG) BY MOUTH THREE TIMES DAILY 90 capsule 5   hydrALAZINE (APRESOLINE) 100 MG tablet Place 1 tablet (100 mg total) into feeding  tube 3 (three) times daily. 90 tablet 3   isosorbide dinitrate (ISORDIL) 10 MG tablet Place 1 tablet (10 mg total) into feeding tube 3 (three) times daily. 90 tablet 3   latanoprost (XALATAN) 0.005 % ophthalmic solution Place 1 drop into both eyes at bedtime. 2.5 mL 0   Multiple Vitamins-Minerals (MULTIVITAMIN WITH MINERALS) tablet Place 1 tablet into feeding tube daily.     Nutritional Supplements (FEEDING SUPPLEMENT, OSMOLITE 1.5 CAL,) LIQD 60 mLs by Per J Tube route continuous.     valbenazine (INGREZZA) 60 MG capsule Take 1 capsule (60 mg total) by mouth daily. (Patient taking differently: Patient takes 40 mg by mouth daily.) 30 capsule 11   zaleplon (SONATA) 10 MG capsule Take 20 mg by mouth at bedtime.     No current facility-administered medications on file prior to visit.    Social History   Socioeconomic History   Marital status: Widowed    Spouse name: Not on file   Number of children: Not on file   Years of education: Not on file   Highest education level: Not on file  Occupational History   Occupation: Best boy  Tobacco Use   Smoking status: Never   Smokeless tobacco: Never   Tobacco comments:    Never smoke 12/25/21  Vaping Use   Vaping status: Never Used  Substance and Sexual Activity   Alcohol use: Not Currently   Drug use: No   Sexual activity: Not Currently    Birth  control/protection: Post-menopausal    Comment: 1st intercourse 73 yo-1 partner  Other Topics Concern   Not on file  Social History Narrative   Lives with 4 cats   Social Determinants of Health   Financial Resource Strain: Low Risk  (10/28/2021)   Received from [facility], [facility]   Overall Financial Resource Strain (CARDIA)    Difficulty of Paying Living Expenses: Not hard at all  Food Insecurity: No Food Insecurity (01/28/2023)   Hunger Vital Sign    Worried About Running Out of Food in the Last Year: Never true    Ran Out of Food in the Last Year: Never true  Transportation Needs: No Transportation Needs (01/28/2023)   PRAPARE - Administrator, Civil Service (Medical): No    Lack of Transportation (Non-Medical): No  Physical Activity: Unknown (01/28/2023)   Exercise Vital Sign    Days of Exercise per Week: Patient declined    Minutes of Exercise per Session: Not on file  Stress: Stress Concern Present (10/28/2021)   Received from [facility], [facility] of Occupational Health - Occupational Stress Questionnaire    Feeling of Stress : Very much  Social Connections: Unknown (01/28/2023)   Social Connection and Isolation Panel [NHANES]    Frequency of Communication with Friends and Family: Never    Frequency of Social Gatherings with Friends and Family: Never    Attends Religious Services: Not on Marketing executive or Organizations: Patient declined    Attends Banker Meetings: Not on file    Marital Status: Not on file  Intimate Partner Violence: Unknown (11/22/2022)   Received from [employer], [facility]   HITS    Physically Hurt: Not on file    Insult or Talk Down To: Not on file    Threaten Physical Harm: Not on file    Scream or Curse: Not on file    Family History  Problem Relation Age  of Onset   Diabetes Mother    Hypertension Mother    Stroke Mother    Heart disease Father 69   Hypertension  Father    Hypertension Sister    Asthma Sister    Gout Sister    Colon cancer Neg Hx    Breast cancer Neg Hx    Esophageal cancer Neg Hx    Stomach cancer Neg Hx    Rectal cancer Neg Hx    Colon polyps Neg Hx      Allergies  Allergen Reactions   Sulfa Antibiotics Rash   Fluoxetine Hcl Other (See Comments)    (PROZAC) Suicidal thoughts      Patient's last menstrual period was No LMP recorded. Patient is postmenopausal..           Review of Systems Alls systems reviewed and are negative.     PE General appearance: alert, cooperative and appears stated age      Pelvic: External genitalia:  no lesions              Urethra:  normal appearing urethra with no masses, tenderness or lesions              Bartholins and Skenes: normal                 Vagina: normal appearing vagina with normal color and discharge, no lesions.               Cervix: no lesions, no cervical motion tenderness                 PROCEDURE: EMB Consent obtained for the procedure.  A bivalve speculum was placed in the vagina.  The cervix was grasped with a single tooth tenaculum.  Pipelle was inserted and rotated.  Uterus measures to 6cm.  Adequate specimen was obtained and sent to pathology.  All instruments were removed.  Patient tolerated the procedure well.  To notify patient of the results.           Chaperone was present for exam.   A:         ER follow-up                             P:        Pap smear and EMB collected           To notify them of results.  Earley Favor

## 2023-04-05 LAB — CYTOLOGY - PAP: Diagnosis: NEGATIVE

## 2023-04-08 ENCOUNTER — Telehealth: Payer: Self-pay | Admitting: Internal Medicine

## 2023-04-08 NOTE — Telephone Encounter (Signed)
Received call from patient advocate, Victorino Dike, requesting updates to the [facility] form. Patient needs the below updated orders from provider.   Medications can be crushed Discontinue Unaboot and order Prevalone Heal protectors when in bed Compression socks when out of bed  Please send orders to: [facility] Director: Hermelinda Medicus [phone]  Sending to provider to approve before updating orders and faxing to facility.

## 2023-04-08 NOTE — Telephone Encounter (Signed)
Prescription Request  04/08/2023  LOV: 03/07/2023  What is the name of the medication or equipment? eliquis  Have you contacted your pharmacy to request a refill? Yes   Which pharmacy would you like this sent to? [facility] on [address] in [place]  Patient notified that their request is being sent to the clinical staff for review and that they should receive a response within 2 business days.   Please advise at Mobile [phone] (mobile)

## 2023-04-09 ENCOUNTER — Other Ambulatory Visit: Payer: Self-pay

## 2023-04-09 DIAGNOSIS — I480 Paroxysmal atrial fibrillation: Secondary | ICD-10-CM

## 2023-04-09 MED ORDER — APIXABAN 5 MG PO TABS: 5.0000 mg | ORAL_TABLET | Freq: Two times a day (BID) | ORAL | 1 refills | Status: DC

## 2023-04-09 NOTE — Telephone Encounter (Signed)
Ok this is done thanks

## 2023-04-09 NOTE — Telephone Encounter (Signed)
Prescription refill request for Eliquis received. Indication:afib Last office visit:5/24 Scr:1.07  8/24 Age: 72 Weight:84.4  kg  Prescription refilled

## 2023-04-30 ENCOUNTER — Telehealth: Payer: Self-pay | Admitting: Internal Medicine

## 2023-04-30 NOTE — Telephone Encounter (Signed)
Patient's advocate Victorino Dike called and said patient is needing a FL2 form for a respite stay. She said there was one done last month that is no longer valid. She would like to know if a form can be done just like the most recent one that Selena Batten worked on.  Victorino Dike would like a call when it is completed at [phone].

## 2023-05-01 NOTE — Telephone Encounter (Signed)
Ok with me, though I dont actually have any forms.  thanks

## 2023-05-06 DIAGNOSIS — G43719 Chronic migraine without aura, intractable, without status migrainosus: Secondary | ICD-10-CM | POA: Insufficient documentation

## 2023-05-06 NOTE — Telephone Encounter (Signed)
Victorino Dike called to check on the status of the form. She said they need it as soon as possible. Best callback is [phone].

## 2023-05-06 NOTE — Telephone Encounter (Signed)
The form needs to be sent over, We do not have the form.

## 2023-05-07 NOTE — Telephone Encounter (Signed)
Tara [name] called to check on the status. She was informed the form needs to be sent over. She is upset that she did not receive a call back to let her know. Tara [name] would like a call back from [place] at [phone].

## 2023-05-07 NOTE — Telephone Encounter (Signed)
Called Tara [name] back and left a message.

## 2023-05-08 ENCOUNTER — Other Ambulatory Visit: Payer: Self-pay

## 2023-05-08 ENCOUNTER — Telehealth: Payer: Self-pay | Admitting: Internal Medicine

## 2023-05-08 MED ORDER — CLORAZEPATE DIPOTASSIUM 7.5 MG PO TABS: 7.5000 mg | ORAL_TABLET | Freq: Two times a day (BID) | ORAL | 2 refills | Status: DC | PRN

## 2023-05-08 NOTE — Telephone Encounter (Signed)
Done erx 

## 2023-05-08 NOTE — Telephone Encounter (Signed)
Prescription Request  05/08/2023  LOV: 03/07/2023  What is the name of the medication or equipment? clorazepate (TRANXENE) 7.5 MG tablet   Have you contacted your pharmacy to request a refill? No   Which pharmacy would you like this sent to?    [facility] #[id number] [place], [place] - [phone] AT [facility] OF [facility] & [address] [place] [address] Phone: [phone] Fax: [phone]   Please advise at Mobile [phone] (mobile)   Pt inquiring she needs it asap

## 2023-05-23 ENCOUNTER — Other Ambulatory Visit: Payer: Self-pay | Admitting: Internal Medicine

## 2023-05-24 ENCOUNTER — Other Ambulatory Visit: Payer: Self-pay

## 2023-05-24 MED ORDER — BACLOFEN 10 MG PO TABS
5.0000 mg | ORAL_TABLET | Freq: Three times a day (TID) | ORAL | 0 refills | Status: DC
  Filled 2023-05-24: qty 30, 20d supply, fill #0

## 2023-05-28 ENCOUNTER — Encounter: Payer: Self-pay | Admitting: Internal Medicine

## 2023-05-28 MED ORDER — ZOLPIDEM TARTRATE 5 MG PO TABS: 5.0000 mg | ORAL_TABLET | Freq: Every evening | ORAL | 1 refills | Status: DC | PRN

## 2023-06-04 ENCOUNTER — Other Ambulatory Visit: Payer: Self-pay

## 2023-06-27 ENCOUNTER — Telehealth: Payer: Self-pay | Admitting: Internal Medicine

## 2023-06-27 NOTE — Telephone Encounter (Signed)
Victorino Dike from [facility] is faxing over an [facility] form for skilled nursing for a respite stay at [facility] in [place]. Best callback is [phone]).

## 2023-06-28 NOTE — Telephone Encounter (Signed)
Form has been received.

## 2023-07-01 NOTE — Telephone Encounter (Signed)
These need to be sent in by Friday.

## 2023-07-03 ENCOUNTER — Other Ambulatory Visit: Payer: Self-pay | Admitting: Internal Medicine

## 2023-07-03 NOTE — Telephone Encounter (Signed)
There is already a phone note about this form and it is being completed.

## 2023-07-03 NOTE — Telephone Encounter (Signed)
Pt daughter states she needs a fl2 form completed for this year. She also mentioned that she worked with Selena Batten last time on this manner and would like to hear from Selena Batten at her earliest convenience.  CB :  [phone] -- Janine Limbo

## 2023-07-03 NOTE — Telephone Encounter (Signed)
Form has been faxed.

## 2023-07-04 NOTE — Telephone Encounter (Signed)
Forms have been picked up as well.

## 2023-07-05 ENCOUNTER — Other Ambulatory Visit: Payer: Self-pay

## 2023-07-05 MED ORDER — ISOSORBIDE DINITRATE 10 MG PO TABS: 10.0000 mg | ORAL_TABLET | Freq: Three times a day (TID) | ORAL | 3 refills | Status: DC

## 2023-07-05 NOTE — Addendum Note (Signed)
Addended by: Racheal Patches on: 07/05/2023 01:58 PM   Modules accepted: Orders

## 2023-07-05 NOTE — Telephone Encounter (Signed)
Copied from CRM [id number]. Topic: Clinical - Medication Refill >> Jul 05, 2023  8:42 AM Benetta Spar A wrote: Most Recent Primary Care Visit:  Provider: Corwin Levins  Department: [facility]  Visit Type: OFFICE VISIT  Date: 03/07/2023  Medication: isosorbide dinitrate (ISORDIL) 10 MG Patient only has 5 pills left and she is going to Respite on Monday 23  Has the patient contacted their pharmacy? No (Agent: If no, request that the patient contact the pharmacy for the refill. If patient does not wish to contact the pharmacy document the reason why and proceed with request.) (Agent: If yes, when and what did the pharmacy advise?)  Is this the correct pharmacy for this prescription? Yes If no, delete pharmacy and type the correct one.  This is the patient's preferred pharmacy:  [facility] STORE #[id number] [place], [place] - [address] AT [facility] OF [facility] & [address] [place] [address] Phone: [phone] Fax: [phone]  [facility] - [place], [place] - One [facility] AT Portal to Registered [address] [place] [address] Phone: [phone] Fax: [phone]  [facility] - [place], [place] - [place] [address] [place] [address] Phone: [phone] Fax: [phone]  [facility] #[id number] - [address], [place] - [address] AT [facility] OF [address] & [facility] [address] [facility] [facility] [address] [place] [address] Phone: [phone] Fax: [phone]  [facility] DRUG STORE #[id number] [place], [place] - [address] AT [facility] [address] [place] [address] Phone: [phone] Fax: [phone]   Has the prescription been filled recently? No  Is the patient out of the medication? No  Has the patient been seen for an appointment in the last year OR does the patient have an upcoming appointment? Yes  Can we respond through  MyChart?Yes  Agent: Please be advised that Rx refills may take up to 3 business days. We ask that you follow-up with your pharmacy.

## 2023-07-06 ENCOUNTER — Other Ambulatory Visit: Payer: Self-pay | Admitting: Internal Medicine

## 2023-07-22 ENCOUNTER — Other Ambulatory Visit: Payer: Self-pay | Admitting: Internal Medicine

## 2023-07-30 DIAGNOSIS — Z434 Encounter for attention to other artificial openings of digestive tract: Secondary | ICD-10-CM | POA: Insufficient documentation

## 2023-07-30 DIAGNOSIS — Z96652 Presence of left artificial knee joint: Secondary | ICD-10-CM | POA: Insufficient documentation

## 2023-07-30 DIAGNOSIS — M24575 Contracture, left foot: Secondary | ICD-10-CM | POA: Insufficient documentation

## 2023-07-30 DIAGNOSIS — I350 Nonrheumatic aortic (valve) stenosis: Secondary | ICD-10-CM | POA: Insufficient documentation

## 2023-07-30 DIAGNOSIS — R569 Unspecified convulsions: Secondary | ICD-10-CM | POA: Insufficient documentation

## 2023-07-30 DIAGNOSIS — E1165 Type 2 diabetes mellitus with hyperglycemia: Secondary | ICD-10-CM | POA: Insufficient documentation

## 2023-07-30 DIAGNOSIS — Z7901 Long term (current) use of anticoagulants: Secondary | ICD-10-CM | POA: Insufficient documentation

## 2023-07-30 DIAGNOSIS — H259 Unspecified age-related cataract: Secondary | ICD-10-CM | POA: Insufficient documentation

## 2023-07-30 DIAGNOSIS — R062 Wheezing: Secondary | ICD-10-CM | POA: Insufficient documentation

## 2023-07-30 DIAGNOSIS — H409 Unspecified glaucoma: Secondary | ICD-10-CM | POA: Insufficient documentation

## 2023-07-30 DIAGNOSIS — J309 Allergic rhinitis, unspecified: Secondary | ICD-10-CM | POA: Insufficient documentation

## 2023-07-30 DIAGNOSIS — E559 Vitamin D deficiency, unspecified: Secondary | ICD-10-CM | POA: Insufficient documentation

## 2023-07-30 DIAGNOSIS — Z9181 History of falling: Secondary | ICD-10-CM | POA: Insufficient documentation

## 2023-07-30 DIAGNOSIS — D6869 Other thrombophilia: Secondary | ICD-10-CM | POA: Insufficient documentation

## 2023-07-30 DIAGNOSIS — M62838 Other muscle spasm: Secondary | ICD-10-CM | POA: Insufficient documentation

## 2023-08-14 ENCOUNTER — Other Ambulatory Visit: Payer: Self-pay | Admitting: Internal Medicine

## 2023-08-15 ENCOUNTER — Telehealth: Payer: Self-pay | Admitting: Internal Medicine

## 2023-08-15 MED ORDER — OMEPRAZOLE 40 MG PO CPDR: 40.0000 mg | DELAYED_RELEASE_CAPSULE | Freq: Every day | ORAL | 3 refills | Status: DC

## 2023-08-15 NOTE — Telephone Encounter (Signed)
Ok done erx

## 2023-08-15 NOTE — Telephone Encounter (Signed)
Copied from CRM [id number]. Topic: Clinical - Medication Refill >> Aug 15, 2023 10:47 AM Desma Mcgregor wrote: Most Recent Primary Care Visit:  Provider: Corwin Levins  Department[facility]  Visit Type: OFFICE VISIT  Date: 03/07/2023  Medication: omeprazole (PRILOSEC) 40 MG capsule. Was discontinued 04/01/23, but requesting to start this medication again as it helps the patient.  Has the patient contacted their pharmacy? Yes, medication not on the list anymore. New Rx will need to be sent  Is this the correct pharmacy for this prescription? Yes If no, delete pharmacy and type the correct one.  This is the patient's preferred pharmacy:  [facility]ORE #[id number] [place], [place] - [address] AT [facility] OF [facility] & [address] [place] [address] Phone: [phone] Fax: [phone]  [facility] - [place], [place] - [address] [facility] AT Portal to Registered [employer] [address] [place] [address] Phone: [phone] Fax: [phone]  Has the prescription been filled recently? No  Is the patient out of the medication? Yes  Has the patient been seen for an appointment in the last year OR does the patient have an upcoming appointment? Yes  Can we respond through MyChart? Yes  Agent: Please be advised that Rx refills may take up to 3 business days. We ask that you follow-up with your pharmacy.

## 2023-08-29 ENCOUNTER — Ambulatory Visit (INDEPENDENT_AMBULATORY_CARE_PROVIDER_SITE_OTHER): Payer: Medicare Other | Admitting: Internal Medicine

## 2023-08-29 VITALS — BP 128/88 | HR 60 | Temp 98.8°F | Ht 66.0 in | Wt 215.0 lb

## 2023-08-29 DIAGNOSIS — R051 Acute cough: Secondary | ICD-10-CM

## 2023-08-29 DIAGNOSIS — E099 Drug or chemical induced diabetes mellitus without complications: Secondary | ICD-10-CM | POA: Diagnosis not present

## 2023-08-29 DIAGNOSIS — J309 Allergic rhinitis, unspecified: Secondary | ICD-10-CM | POA: Diagnosis not present

## 2023-08-29 DIAGNOSIS — H1013 Acute atopic conjunctivitis, bilateral: Secondary | ICD-10-CM

## 2023-08-29 DIAGNOSIS — Z23 Encounter for immunization: Secondary | ICD-10-CM | POA: Diagnosis not present

## 2023-08-29 DIAGNOSIS — I10 Essential (primary) hypertension: Secondary | ICD-10-CM | POA: Diagnosis not present

## 2023-08-29 DIAGNOSIS — N184 Chronic kidney disease, stage 4 (severe): Secondary | ICD-10-CM

## 2023-08-29 DIAGNOSIS — E7800 Pure hypercholesterolemia, unspecified: Secondary | ICD-10-CM | POA: Diagnosis not present

## 2023-08-29 DIAGNOSIS — E559 Vitamin D deficiency, unspecified: Secondary | ICD-10-CM

## 2023-08-29 DIAGNOSIS — T380X5D Adverse effect of glucocorticoids and synthetic analogues, subsequent encounter: Secondary | ICD-10-CM

## 2023-08-29 LAB — POCT GLYCOSYLATED HEMOGLOBIN (HGB A1C): Hemoglobin A1C: 5.3 % (ref 4.0–5.6)

## 2023-08-29 MED ORDER — HYDROCODONE BIT-HOMATROP MBR 5-1.5 MG/5ML PO SOLN: 5.0000 mL | Freq: Four times a day (QID) | ORAL | 0 refills | Status: DC | PRN

## 2023-08-29 NOTE — Patient Instructions (Addendum)
Your A1c was done today  Please take all new medication as prescribed - the cough medicine as needed  Please be sure to call for your yearly eye doctor appt  Please continue all other medications as before, and refills have been done if requested.  Please have the pharmacy call with any other refills you may need.  Please continue your efforts at being more active, low cholesterol diet, and weight control.  You are otherwise up to date with prevention measures today.  Please keep your appointments with your specialists as you may have planned  No other lab work needed today  Please make an Appointment to return in 6 months, or sooner if needed

## 2023-08-29 NOTE — Progress Notes (Signed)
Patient ID: Tara [name], female   DOB: 11-Feb-1951, 73 y.o.   MRN: [id number]        Chief Complaint: follow up recent flu like symptoms, hyperglycemia, hld, htn       HPI:  Tara [name] is a 73 y.o. female here with recent influenza A jan 21 positive testing, now overall much improved, but unfortuantely with a persistent non prod cough that still keeps her up at night, and racking during the day, with some left lower anterior chest wall pain as well.  Pt denies increased sob or doe, wheezing, orthopnea, PND, increased LE swelling, palpitations, dizziness or syncope.  Pt denies polydipsia, polyuria, or new focal neuro s/s.    Does have several wks ongoing nasal allergy symptoms with clearish congestion, itch and sneezing, without fever, pain, ST, cough, swelling or wheezing.       Wt Readings from Last 3 Encounters:  08/29/23 215 lb (97.5 kg)  03/13/23 186 lb (84.4 kg)  11/20/22 184 lb 15.5 oz (83.9 kg)   BP Readings from Last 3 Encounters:  08/29/23 128/88  04/01/23 110/70  03/13/23 139/75         Past Medical History:  Diagnosis Date   Anemia    on meds   ANKLE PAIN, LEFT 04/01/2008   ANXIETY 04/17/2007   on meds   Arthritis    generalized   Cataract    bilateral sx   Chronic kidney disease    stage 3   Colon polyp    COLONIC POLYPS, HX OF 08/01/2007   CONTUSIONS, MULTIPLE 04/01/2009   DEPRESSION 04/17/2007   on meds   DIZZINESS 08/01/2007   DYSPNEA 08/01/2007   with exertion   Enlargement of lymph nodes 08/13/2007   Excessive involuntary blinking    per pt,going on since 2018   Glaucoma    on meds   GLUCOSE INTOLERANCE 08/01/2007   Hypercalcemia due to sarcoidosis 2014   HYPERLIPIDEMIA 08/01/2007   no meds   HYPERSOMNIA 07/28/2008   HYPERTENSION 04/17/2007   Impaired glucose tolerance 03/23/2011   JOINT EFFUSION, LEFT KNEE 06/02/2010   Loose body in knee 04/01/2009   pt not sure?   Metabolic encephalopathy 12/09/2015-12/2015   Migraines    "stopped 3-4 yr  ago" (07/23/2012)   Morbid obesity (HCC) 04/20/2007   OTHER DISEASES OF LUNG NOT ELSEWHERE CLASSIFIED 08/01/2007   Pain in joint, lower leg 04/01/2009   PERIPHERAL EDEMA 04/21/2009   Pre-diabetes    Sarcoidosis 09/25/2007   Seasonal allergies    SHOULDER PAIN, LEFT 04/01/2009   Sleep apnea    does not use Cpap   Past Surgical History:  Procedure Laterality Date   BREAST SURGERY     Biopsy benign/bil breaST   CARDIOVERSION N/A 12/15/2021   Procedure: CARDIOVERSION;  Surgeon: Chilton Si, MD;  Location: [facility] ENDOSCOPY;  Service: Cardiovascular;  Laterality: N/A;   CARDIOVERSION N/A 02/09/2022   Procedure: CARDIOVERSION;  Surgeon: Chrystie Nose, MD;  Location: [facility] ENDOSCOPY;  Service: Cardiovascular;  Laterality: N/A;   CATARACT EXTRACTION     RIGHT EYE   COLONOSCOPY  2020   KN-MAC-suprep(good)-tics/TA's   COMBINED MEDIASTINOSCOPY AND BRONCHOSCOPY  08/2007   COMBINED MEDIASTINOSCOPY AND BRONCHOSCOPY  2009   Dental implant     FRACTURE SURGERY  ?02/1997   "left upper arm; put rod in" (07/23/2012)   GUM SURGERY  2000-?2009   "several ORs; soft tissue graft; took material from roof of mouth" (07/23/2012   HUMERUS SURGERY Left 1998   rod  insertion   IR GASTROSTOMY TUBE MOD SED  06/22/2022   IR REPLC GASTRO/COLONIC TUBE PERCUT W/FLUORO  10/25/2022   KNEE ARTHROSCOPY  03/2004; 06/2009   "right; left" Dr. Thomasena Edis   KNEE SURGERY  2012   ARTHROSCOPIC LEFT KNEE   LYMPH NODE BIOPSY  ~ 2009   "for sarcoidosis; don't know exactly which nodes" (07/23/2102)   MYOMECTOMY  1994   Open   POLYPECTOMY  2020   TA's   REFRACTIVE SURGERY  08/1998   "both eyes" (07/23/2012)   REFRACTIVE SURGERY  2000   Bil   TOTAL KNEE ARTHROPLASTY Left 09/24/2016   Procedure: TOTAL KNEE ARTHROPLASTY;  Surgeon: Jodi Geralds, MD;  Location: [facility] OR;  Service: Orthopedics;  Laterality: Left;    reports that she has never smoked. She has never used smokeless tobacco. She reports that she does not currently use alcohol. She  reports that she does not use drugs. family history includes Asthma in her sister; Diabetes in her mother; Gout in her sister; Heart disease (age of onset: 41) in her father; Hypertension in her father, mother, and sister; Stroke in her mother. Allergies  Allergen Reactions   Sulfa Antibiotics Rash   Fluoxetine Hcl Other (See Comments)    (PROZAC) Suicidal thoughts   Cocoa    Omega-3 Fatty Acids (Plant)    Current Outpatient Medications on File Prior to Visit  Medication Sig Dispense Refill   acetaminophen (TYLENOL) 650 MG CR tablet Take 650 mg by mouth every 8 (eight) hours as needed for pain.     albuterol (VENTOLIN HFA) 108 (90 Base) MCG/ACT inhaler Inhale 2 puffs into the lungs every 6 (six) hours as needed for wheezing or shortness of breath. 8 g 5   amoxicillin (AMOXIL) 500 MG tablet Take 2,000 mg by mouth once.     antiseptic oral rinse (BIOTENE) LIQD 15 mLs by Mouth Rinse route 3 (three) times daily as needed for dry mouth or mouth pain.     apixaban (ELIQUIS) 5 MG TABS tablet Place 1 tablet (5 mg total) into feeding tube 2 (two) times daily. 180 tablet 1   baclofen (LIORESAL) 10 MG tablet Take 0.5 tablets (5 mg total) by mouth 3 (three) times daily. 30 each 0   bisacodyl (DULCOLAX) 10 MG suppository Place 1 suppository (10 mg total) rectally as needed for moderate constipation. 12 suppository 0   brimonidine-timolol (COMBIGAN) 0.2-0.5 % ophthalmic solution Place 1 drop into both eyes every 12 (twelve) hours.     cholecalciferol (CHOLECALCIFEROL) 25 MCG tablet Place 1 tablet (1,000 Units total) into feeding tube daily.     Cholecalciferol (VITAMIN D-3 PO) Take 1 tablet by mouth every morning.     clorazepate (TRANXENE) 7.5 MG tablet TAKE 1 TABLET(7.5 MG) BY MOUTH TWICE DAILY AS NEEDED FOR ANXIETY 60 tablet 5   desvenlafaxine (PRISTIQ) 100 MG 24 hr tablet Take 1 tablet (100 mg total) by mouth at bedtime. 90 tablet 3   diltiazem (CARDIZEM) 60 MG tablet Take by mouth.     fluticasone  (FLONASE) 50 MCG/ACT nasal spray Place 2 sprays into both nostrils daily. 16 g 6   gabapentin (NEURONTIN) 100 MG capsule TAKE 1 CAPSULE(100 MG) BY MOUTH THREE TIMES DAILY 90 capsule 5   hydrALAZINE (APRESOLINE) 100 MG tablet Place 1 tablet (100 mg total) into feeding tube 3 (three) times daily. 90 tablet 3   ipratropium-albuterol (DUONEB) 0.5-2.5 (3) MG/3ML SOLN Inhale into the lungs.     isosorbide dinitrate (ISORDIL) 10 MG tablet Place  1 tablet (10 mg total) into feeding tube 3 (three) times daily. 90 tablet 3   isosorbide mononitrate (ISMO) 10 MG tablet SMARTSIG:1 Tablet(s) Gastro Tube 3 Times Daily     latanoprost (XALATAN) 0.005 % ophthalmic solution Place 1 drop into both eyes at bedtime. 2.5 mL 0   loperamide (IMODIUM A-D) 2 MG tablet Take by mouth.     Multiple Vitamins-Minerals (MULTIVITAMIN WITH MINERALS) tablet Place 1 tablet into feeding tube daily.     Nutritional Supplements (FEEDING SUPPLEMENT, OSMOLITE 1.5 CAL,) LIQD 60 mLs by Per J Tube route continuous.     omeprazole (PRILOSEC) 40 MG capsule Take 1 capsule (40 mg total) by mouth daily. 90 capsule 3   valbenazine (INGREZZA) 60 MG capsule Take 1 capsule (60 mg total) by mouth daily. (Patient taking differently: Patient takes 40 mg by mouth daily.) 30 capsule 11   zaleplon (SONATA) 10 MG capsule Take 20 mg by mouth at bedtime.     zolpidem (AMBIEN) 5 MG tablet Take 1 tablet (5 mg total) by mouth at bedtime as needed for sleep. 90 tablet 1   No current facility-administered medications on file prior to visit.        ROS:  All others reviewed and negative.  Objective        PE:  BP 128/88 (BP Location: Right Arm, Patient Position: Sitting, Cuff Size: Normal)   Pulse 60   Temp 98.8 F (37.1 C) (Oral)   Ht 5\' 6"  (1.676 m)   Wt 215 lb (97.5 kg)   SpO2 98%   BMI 34.70 kg/m                 Constitutional: Pt appears in NAD               HENT: Head: NCAT.                Right Ear: External ear normal.                 Left  Ear: External ear normal.                Eyes: . Pupils are equal, round, and reactive to light. Conjunctivae and EOM are normal               Nose: without d/c or deformity               Neck: Neck supple. Gross normal ROM               Cardiovascular: Normal rate and regular rhythm.                 Pulmonary/Chest: Effort normal and breath sounds without rales or wheezing.                Abd:  Soft, NT, ND, + BS, no organomegaly               Neurological: Pt is alert. At baseline orientation, motor grossly intact               Skin: Skin is warm. No rashes, no other new lesions, LE edema - none               Psychiatric: Pt behavior is normal without agitation   Micro: none  Cardiac tracings I have personally interpreted today:  none  Pertinent Radiological findings (summarize): none   Lab Results  Component Value Date   WBC 19.9 (H) 03/13/2023  HGB 14.7 03/13/2023   HCT 45.4 03/13/2023   PLT 221 03/13/2023   GLUCOSE 94 03/13/2023   CHOL 143 03/07/2023   TRIG 208.0 (H) 03/07/2023   HDL 39.40 03/07/2023   LDLDIRECT 59.0 03/07/2023   LDLCALC 43 10/10/2021   ALT 38 03/13/2023   AST 27 03/13/2023   NA 137 03/13/2023   K 3.4 (L) 03/13/2023   CL 101 03/13/2023   CREATININE 1.07 (H) 03/13/2023   BUN 20 03/13/2023   CO2 25 03/13/2023   TSH 1.53 03/07/2023   INR 1.4 (H) 11/21/2022   HGBA1C 5.3 08/29/2023   MICROALBUR 1.0 10/06/2020   Assessment/Plan:  Tara [name] is a 73 y.o. White or Caucasian [1] female with  has a past medical history of Anemia, ANKLE PAIN, LEFT (04/01/2008), ANXIETY (04/17/2007), Arthritis, Cataract, Chronic kidney disease, Colon polyp, COLONIC POLYPS, HX OF (08/01/2007), CONTUSIONS, MULTIPLE (04/01/2009), DEPRESSION (04/17/2007), DIZZINESS (08/01/2007), DYSPNEA (08/01/2007), Enlargement of lymph nodes (08/13/2007), Excessive involuntary blinking, Glaucoma, GLUCOSE INTOLERANCE (08/01/2007), Hypercalcemia due to sarcoidosis (2014), HYPERLIPIDEMIA  (08/01/2007), HYPERSOMNIA (07/28/2008), HYPERTENSION (04/17/2007), Impaired glucose tolerance (03/23/2011), JOINT EFFUSION, LEFT KNEE (06/02/2010), Loose body in knee (04/01/2009), Metabolic encephalopathy (12/09/2015-12/2015), Migraines, Morbid obesity (HCC) (04/20/2007), OTHER DISEASES OF LUNG NOT ELSEWHERE CLASSIFIED (08/01/2007), Pain in joint, lower leg (04/01/2009), PERIPHERAL EDEMA (04/21/2009), Pre-diabetes, Sarcoidosis (09/25/2007), Seasonal allergies, SHOULDER PAIN, LEFT (04/01/2009), and Sleep apnea.  Cough Post flu viral illness, for cough med prn,  to f/u any worsening symptoms or concerns  CKD (chronic kidney disease) stage 4, GFR 15-29 ml/min (HCC) Lab Results  Component Value Date   CREATININE 1.07 (H) 03/13/2023   Stable overall, cont to avoid nephrotoxins   Essential hypertension BP Readings from Last 3 Encounters:  08/29/23 128/88  04/01/23 110/70  03/13/23 139/75   Stable, pt to continue medical treatment hydralazine 100 tid   Hyperlipidemia Lab Results  Component Value Date   LDLCALC 43 10/10/2021   Stable, pt to cont low chol diet   Steroid-induced diabetes Lab Results  Component Value Date   HGBA1C 5.3 08/29/2023   Stable, pt to continue current medical treatment  - diet, wt control   Vitamin D deficiency, unspecified Last vitamin D Lab Results  Component Value Date   VD25OH 68.07 11/06/2022   Stable, cont oral replacement   Allergic conjunctivitis and rhinitis Mild to mod, for restart flonase asd,  to f/u any worsening symptoms or concerns Followup: Return in about 6 months (around 02/26/2024).  Oliver Barre, MD 08/31/2023 10:22 PM [facility]  Primary Care - [facility] Internal [facility]

## 2023-08-31 ENCOUNTER — Encounter: Payer: Self-pay | Admitting: Internal Medicine

## 2023-08-31 NOTE — Assessment & Plan Note (Signed)
Last vitamin D Lab Results  Component Value Date   VD25OH 68.07 11/06/2022   Stable, cont oral replacement

## 2023-08-31 NOTE — Assessment & Plan Note (Signed)
Post flu viral illness, for cough med prn,  to f/u any worsening symptoms or concerns

## 2023-08-31 NOTE — Assessment & Plan Note (Signed)
Mild to mod, for restart flonase asd, to f/u any worsening symptoms or concerns

## 2023-08-31 NOTE — Assessment & Plan Note (Signed)
Lab Results  Component Value Date   HGBA1C 5.3 08/29/2023   Stable, pt to continue current medical treatment  - diet, wt control

## 2023-08-31 NOTE — Assessment & Plan Note (Signed)
Lab Results  Component Value Date   LDLCALC 43 10/10/2021   Stable, pt to cont low chol diet

## 2023-08-31 NOTE — Assessment & Plan Note (Signed)
Lab Results  Component Value Date   CREATININE 1.07 (H) 03/13/2023   Stable overall, cont to avoid nephrotoxins

## 2023-08-31 NOTE — Assessment & Plan Note (Signed)
BP Readings from Last 3 Encounters:  08/29/23 128/88  04/01/23 110/70  03/13/23 139/75   Stable, pt to continue medical treatment hydralazine 100 tid

## 2023-09-02 ENCOUNTER — Telehealth: Payer: Self-pay | Admitting: Internal Medicine

## 2023-09-02 MED ORDER — NEOMYCIN-POLYMYXIN-HC 3.5-10000-1 OP SUSP: 4.0000 [drp] | Freq: Four times a day (QID) | OPHTHALMIC | 0 refills | Status: DC

## 2023-09-02 NOTE — Telephone Encounter (Signed)
 Done erx

## 2023-09-02 NOTE — Telephone Encounter (Signed)
Copied from CRM [id number]. Topic: General - Other >> Sep 02, 2023 10:22 AM Tara [name] wrote: Reason for CRM: Pt's care giver called stated pt has conjunctivitis in right eye. Pt does better with eye drops. Please call [phone]

## 2023-09-03 ENCOUNTER — Encounter: Payer: Self-pay | Admitting: Internal Medicine

## 2023-09-03 ENCOUNTER — Ambulatory Visit: Payer: Medicare Other | Attending: Internal Medicine | Admitting: Internal Medicine

## 2023-09-03 VITALS — BP 110/80 | HR 70 | Ht 66.0 in | Wt 215.0 lb

## 2023-09-03 DIAGNOSIS — I480 Paroxysmal atrial fibrillation: Secondary | ICD-10-CM | POA: Insufficient documentation

## 2023-09-03 NOTE — Patient Instructions (Signed)
Medication Instructions:  Continue same medications *If you need a refill on your cardiac medications before your next appointment, please call your pharmacy*   Lab Work: None ordered   Testing/Procedures: None ordered   Follow-Up: At [facility], you and your health needs are our priority.  As part of our continuing mission to provide you with exceptional heart care, we have created designated Provider Care Teams.  These Care Teams include your primary Cardiologist (physician) and Advanced Practice Providers (APPs -  Physician Assistants and Nurse Practitioners) who all work together to provide you with the care you need, when you need it.  We recommend signing up for the patient portal called "MyChart".  Sign up information is provided on this After Visit Summary.  MyChart is used to connect with patients for Virtual Visits (Telemedicine).  Patients are able to view lab/test results, encounter notes, upcoming appointments, etc.  Non-urgent messages can be sent to your provider as well.   To learn more about what you can do with MyChart, go to [email or web address].    Your next appointment:  6 months    Call in April to schedule August appointment     Provider:  Bernadene Person PA

## 2023-09-03 NOTE — Progress Notes (Signed)
Cardiology Office Note:    Date:  09/03/2023   ID:  Tara [name], DOB [birth year], MRN [id number]  PCP:  Corwin Levins, MD   [facility] Providers Cardiologist:  Maisie Fus, MD     Referring MD: Corwin Levins, MD   Chief Complaint  Patient presents with   Follow-up    1 year.   Chest Pain   Edema  Atrial Fibrillation  History of Present Illness:    Tara [name] is a 73 y.o. female with a hx of CKD, sarcoidosis (+biopsy), HTN, referral for paroxysmal atrial fibrillation  She was seen by GI for surveillance colonoscopy. During her colonoscopy noted to be in afib.  She broought in the 12 lead which shows atrial fibrillation.  TSH is normal. She has sleep apnea, difficulty with cpap adherence 2/2 dry eye exacerbation. She has a sleep study pending with pulmonology, sees Dr. Maple Hudson. She's planned for repeat sleep study. She's being considered for Inspire. Blood pressures are well controlled. She denies significant ETOH. No GI bleeding history. No hx of stroke.  She's felt more tired than usual. She does not sense the palpations. No PND, orthopnea or LE edema.   Interim Hx 8/10 She is s/p DCCV , converted to junctional bradycardia. Her atenolol was stopped. She was seen by Dr. Maple Hudson.  She can't tolerate her CPAP. She notes eye dryness. She was recommended to use an eye protector. She is being considered for inspire. She does a lot of walking, and she can feel she is running out of breath, but she can continue a 20 minute work out. She underwent another DCCV 7/28. She started flecinaide 50 mg BID and continues to follow with afib clinic. No PND or orthopnea. No LE edema  Interim Hx 9/1 She went to the ED and noted dizziness and weakness. She was walking outside for exercise and was dehydrated. She feels better  Interim hx 04/25/2022  She had a fall and UTI and was admitted to the hospital 9/17-9/25. She was then sent to a SNF.  She fell again at the SNF. She was trying to reach for  the call button. She had a mechanical. I saw her initially in the ED and suggested she needed long-term [name]. She is still at [facility]. She was seen in afib flinic 9/11.Has been in persistent afib.  They stopped flecainide because it was ineffective in keeping her in sinus rhythm. She had a cardiac  monitor that finished 10/9 that  showed 100% afib. Follow up with Ep to discuss ablation. Today, she comes in with further deterioration. She's in a wheelchair.   Interim hx 08/30/2022 She has had further debilitation. She was admitted for sepsis and aspiration PNA. She was diagnosed with neurosarcoidosis. No palpitations.  She denies LH or SOB.  No bleeding. BP is in good control    Interim hx 09/03/2023 She comes in today post hospitalization. She had influenza A in January. Her BNP was mildly elevated.She denies shortness of breath or consistent chest pain.    Cardiology Studies: TTE 12/10/2015: EF normal, LAVi 41 cc/m2 TTE 01/11/2022: Normal LV function, LAVi 49 cc/m2 TTE 06/05/2022: Normal LV fxn, mild LVH, nl RV size, no sig change from prior Ziop[place] 03/26/2022-04/23/2022: 100% afib    Current Medications: Current Outpatient Medications on File Prior to Visit  Medication Sig Dispense Refill   acetaminophen (TYLENOL) 650 MG CR tablet Take 650 mg by mouth every 8 (eight) hours as needed for pain.  albuterol (VENTOLIN HFA) 108 (90 Base) MCG/ACT inhaler Inhale 2 puffs into the lungs every 6 (six) hours as needed for wheezing or shortness of breath. 8 g 5   antiseptic oral rinse (BIOTENE) LIQD 15 mLs by Mouth Rinse route 3 (three) times daily as needed for dry mouth or mouth pain.     apixaban (ELIQUIS) 5 MG TABS tablet Place 1 tablet (5 mg total) into feeding tube 2 (two) times daily. 180 tablet 1   baclofen (LIORESAL) 10 MG tablet Take 0.5 tablets (5 mg total) by mouth 3 (three) times daily. 30 each 0   bisacodyl (DULCOLAX) 10 MG suppository Place 1 suppository (10 mg total) rectally as needed for  moderate constipation. 12 suppository 0   brimonidine-timolol (COMBIGAN) 0.2-0.5 % ophthalmic solution Place 1 drop into both eyes every 12 (twelve) hours.     cholecalciferol (CHOLECALCIFEROL) 25 MCG tablet Place 1 tablet (1,000 Units total) into feeding tube daily.     Cholecalciferol (VITAMIN D-3 PO) Take 1 tablet by mouth every morning.     clorazepate (TRANXENE) 7.5 MG tablet TAKE 1 TABLET(7.5 MG) BY MOUTH TWICE DAILY AS NEEDED FOR ANXIETY 60 tablet 5   desvenlafaxine (PRISTIQ) 100 MG 24 hr tablet Take 1 tablet (100 mg total) by mouth at bedtime. 90 tablet 3   diltiazem (CARDIZEM) 60 MG tablet Take by mouth.     fluticasone (FLONASE) 50 MCG/ACT nasal spray Place 2 sprays into both nostrils daily. 16 g 6   gabapentin (NEURONTIN) 100 MG capsule TAKE 1 CAPSULE(100 MG) BY MOUTH THREE TIMES DAILY 90 capsule 5   hydrALAZINE (APRESOLINE) 100 MG tablet Place 1 tablet (100 mg total) into feeding tube 3 (three) times daily. 90 tablet 3   isosorbide dinitrate (ISORDIL) 10 MG tablet Place 1 tablet (10 mg total) into feeding tube 3 (three) times daily. 90 tablet 3   latanoprost (XALATAN) 0.005 % ophthalmic solution Place 1 drop into both eyes at bedtime. 2.5 mL 0   loperamide (IMODIUM A-D) 2 MG tablet Take by mouth.     Multiple Vitamins-Minerals (MULTIVITAMIN WITH MINERALS) tablet Place 1 tablet into feeding tube daily.     neomycin-polymyxin-hydrocortisone (CORTISPORIN) 3.5-10000-1 ophthalmic suspension Place 4 drops into both eyes 4 (four) times daily. 7.5 mL 0   Nutritional Supplements (FEEDING SUPPLEMENT, OSMOLITE 1.5 CAL,) LIQD 60 mLs by Per J Tube route continuous.     omeprazole (PRILOSEC) 40 MG capsule Take 1 capsule (40 mg total) by mouth daily. 90 capsule 3   valbenazine (INGREZZA) 60 MG capsule Take 1 capsule (60 mg total) by mouth daily. (Patient taking differently: Patient takes 40 mg by mouth daily.) 30 capsule 11   zaleplon (SONATA) 10 MG capsule Take 20 mg by mouth at bedtime.      zolpidem (AMBIEN) 5 MG tablet Take 1 tablet (5 mg total) by mouth at bedtime as needed for sleep. 90 tablet 1   No current facility-administered medications on file prior to visit.     Allergies:   Sulfa antibiotics, Fluoxetine hcl, Cocoa, and Omega-3 fatty acids (plant)   Social History   Socioeconomic History   Marital status: Widowed    Spouse name: Not on file   Number of children: Not on file   Years of education: Not on file   Highest education level: Not on file  Occupational History   Occupation: Best boy  Tobacco Use   Smoking status: Never   Smokeless tobacco: Never   Tobacco comments:    Never smoke  12/25/21  Vaping Use   Vaping status: Never Used  Substance and Sexual Activity   Alcohol use: Not Currently   Drug use: No   Sexual activity: Not Currently    Birth control/protection: Post-menopausal    Comment: 1st intercourse 74 yo-1 partner  Other Topics Concern   Not on file  Social History Narrative   Lives with 4 cats   Social Drivers of Health   Financial Resource Strain: Low Risk  (10/28/2021)   [facility] from [facility]   Overall Financial Resource Strain (CARDIA)    Difficulty of Paying Living Expenses: Not hard at all  Food Insecurity: No Food Insecurity (01/28/2023)   Hunger Vital Sign    Worried About Running Out of Food in the Last Year: Never true    Ran Out of Food in the Last Year: Never true  Transportation Needs: Unknown (01/28/2023)   PRAPARE - Administrator, Civil Service (Medical): No    Lack of Transportation (Non-Medical): Not on file  Physical Activity: Unknown (01/28/2023)   Exercise Vital Sign    Days of Exercise per Week: Patient declined    Minutes of Exercise per Session: Not on file  Stress: Stress Concern Present (10/28/2021)   Received from [facility], [facility] of Occupational Health - Occupational Stress Questionnaire    Feeling of Stress : Very much  Social  Connections: Unknown (01/28/2023)   Social Connection and Isolation Panel [NHANES]    Frequency of Communication with Friends and Family: Never    Frequency of Social Gatherings with Friends and Family: Never    Attends Religious Services: Not on Marketing executive or Organizations: Patient declined    Attends Banker Meetings: Not on file    Marital Status: Not on file     Family History: The patient's family history includes Asthma in her sister; Diabetes in her mother; Gout in her sister; Heart disease (age of onset: 39) in her father; Hypertension in her father, mother, and sister; Stroke in her mother. There is no history of Colon cancer, Breast cancer, Esophageal cancer, Stomach cancer, Rectal cancer, or Colon polyps.  ROS:   Please see the history of present illness.     All other systems reviewed and are negative.  EKGs/Labs/Other Studies Reviewed:    The following studies were reviewed today:   EKG:  EKG is  ordered today.  The ekg ordered today demonstrates   11/23/2021-Rate controlled atrial fibrillation   02/22/2022- atrial fibrillation rate 80  EKG Interpretation Date/Time:  Tuesday September 03 2023 15:34:29 EST Ventricular Rate:  70 PR Interval:    QRS Duration:  76 QT Interval:  452 QTC Calculation: 488 R Axis:   6  Text Interpretation: Atrial fibrillation Cannot rule out Anterior infarct (cited on or before 29-Nov-2022) When compared with ECG of 29-Nov-2022 11:03, Questionable change in QRS axis Non-specific change in ST segment in Lateral leads Nonspecific T wave abnormality now evident in Inferior leads Nonspecific T wave abnormality no longer evident in Lateral leads Confirmed by Carolan Clines ([id number]) on 09/03/2023 4:00:37 PM    Recent Labs: 11/21/2022: B Natriuretic Peptide 181.6 03/07/2023: TSH 1.53 03/13/2023: ALT 38; BUN 20; Creatinine, Ser 1.07; Hemoglobin 14.7; Platelets 221; Potassium 3.4; Sodium 137    Recent Lipid Panel     Component Value Date/Time   CHOL 143 03/07/2023 1125   TRIG 208.0 (H) 03/07/2023 1125   HDL 39.40 03/07/2023  1125   CHOLHDL 4 03/07/2023 1125   VLDL 41.6 (H) 03/07/2023 1125   LDLCALC 43 10/10/2021 1013   LDLDIRECT 59.0 03/07/2023 1125     Risk Assessment/Calculations:    CHA2DS2-VASc Score = 3   This indicates a 3.2% annual risk of stroke. The patient's score is based upon: CHF History: 0 HTN History: 1 Diabetes History: 0 Stroke History: 0 Vascular Disease History: 0 Age Score: 1 Gender Score: 1          Physical Exam:    VS:   Vitals:   09/03/23 1525  BP: 110/80  Pulse: 70    Wt Readings from Last 3 Encounters:  09/03/23 215 lb (97.5 kg)  08/29/23 215 lb (97.5 kg)  03/13/23 186 lb (84.4 kg)     GEN:  Well nourished, well developed in no acute distress NECK: No JVD; No carotid bruits LYMPHATICS: No lymphadenopathy CARDIAC: irregularly irregular no murmurs, rubs, gallops RESPIRATORY:  Clear to auscultation without rales, wheezing or rhonchi  ABDOMEN: Soft, non-tender, non-distended MUSCULOSKELETAL:  No edema; No deformity  SKIN: Warm and dry NEUROLOGIC:  Alert and oriented x 3 PSYCHIATRIC:  Normal affect   ASSESSMENT:    FTT: she has deteriorated quite rapidly since I first met her. C/f ?neurodegenerative dx/ parkinson's disease?Marland Kitchen She sees neurology at [facility] for tardive dyskinesia on ingrezza She has dysphagia. She had thrush on exam today. - started nystatin suspension for 7 days - prior Qtc 10/9 502 ms on ingrezza, can continue to monitor, if further increase, would consider consider stopping --> now know this is neurosarcoidosis s/p PEG tube  Persistent Atrial Fibrillation: New Onset 2023. CHADS2VASC 3. It is challenging maintaining her in sinus rhythm. Her LA is severely dilated. Highly encouraged continuing her CPAP with the eye protector for OSA in the past. She was seenin afib clinic 9/11 and plan to stop flecainide and consider PVI. This was  not stopped after last hospital visit. I don't think she is a good candidate for ablation at this point. Can continue flecainide, was restarted in the hospital in December 2023 -continue diltiazem 240 mg daily  -continue eliquis 5 mg BID -continue flecainide 50 mg BID  Fall: mechanical, non cardiac related.   GNF:AOZH controlled. Continue current regimen above  CKD Stage III:  Bnp mildly elevated with influenza A; she is euvolemic today.   PLAN:    In order of problems listed above:   No changes FU with an APP in 6 months      Medication Adjustments/Labs and Tests Ordered: Current medicines are reviewed at length with the patient today.  Concerns regarding medicines are outlined above.  Orders Placed This Encounter  Procedures   EKG 12-Lead   No orders of the defined types were placed in this encounter.   Patient Instructions  Medication Instructions:  Continue same medications *If you need a refill on your cardiac medications before your next appointment, please call your pharmacy*   Lab Work: None ordered   Testing/Procedures: None ordered   Follow-Up: At [facility], you and your health needs are our priority.  As part of our continuing mission to provide you with exceptional heart [name], we have created designated Provider [name] Teams.  These [name] Teams include your primary Cardiologist (physician) and Advanced Practice Providers (APPs -  Physician Assistants and Nurse Practitioners) who all work together to provide you with the [name] you need, when you need it.  We recommend signing up for the patient portal called "MyChart".  Sign up information is provided on this After Visit Summary.  MyChart is used to connect with patients for Virtual Visits (Telemedicine).  Patients are able to view lab/test results, encounter notes, upcoming appointments, etc.  Non-urgent messages can be sent to your provider as well.   To learn more about what you can do with  MyChart, go to [email or web address].    Your next appointment:  6 months    Call in April to schedule August appointment     Provider:  Bernadene Person PA          Signed, Maisie Fus, MD  09/03/2023 4:00 PM    [facility]

## 2023-09-05 ENCOUNTER — Other Ambulatory Visit: Payer: Self-pay | Admitting: Internal Medicine

## 2023-09-05 ENCOUNTER — Other Ambulatory Visit: Payer: Self-pay

## 2023-09-09 ENCOUNTER — Other Ambulatory Visit: Payer: Self-pay | Admitting: Internal Medicine

## 2023-09-13 ENCOUNTER — Other Ambulatory Visit: Payer: Self-pay | Admitting: Internal Medicine

## 2023-09-13 NOTE — Telephone Encounter (Signed)
 Copied from CRM [id number]. Topic: Clinical - Medication Refill >> Sep 13, 2023  9:35 AM Alcus Dad wrote: Most Recent Primary Care Visit:  Provider: Corwin Levins  Department: [facility] [place]  Visit Type: HOSPITAL FOLLOW UP  Date: 08/29/2023  Medication: hydrALAZINE (APRESOLINE) 100 MG tablet  Has the patient contacted their pharmacy? Yes (Agent: If no, request that the patient contact the pharmacy for the refill. If patient does not wish to contact the pharmacy document the reason why and proceed with request.) (Agent: If yes, when and what did the pharmacy advise?)  Is this the correct pharmacy for this prescription? Yes If no, delete pharmacy and type the correct one.  This is the patient's preferred pharmacy:  [facility][id number] [place], [place] - [address] AT [facility] OF [facility] & [address] [place] [address] Phone: [phone] Fax: [phone]  [facility] - [place], [place] - One [facility] AT Portal to Registered [address] [place] [address] Phone: [phone] Fax: [phone]  [facility] [place], [place] - [place] [address] [place] [address] Phone: [phone] Fax: [phone]  [facility] #[id number] - [address] [place] - [address] AT [facility] OF [address] & [facility] [address] [place] [address] Phone: [phone] Fax: [phone]  [facility] #[id number] [place], [place] - [address] AT [facility] [address] [place] [address] Phone: [phone] Fax: [phone]   Has the prescription been filled recently? No  Is the patient out of the medication? Yes  Has the patient been seen for an appointment in the last year OR does the patient have an upcoming appointment? Yes  Can we respond through MyChart? Yes  Agent: Please be advised that Rx refills may take up to 3  business days. We ask that you follow-up with your pharmacy.

## 2023-09-18 ENCOUNTER — Other Ambulatory Visit: Payer: Self-pay | Admitting: Internal Medicine

## 2023-09-18 MED ORDER — HYDRALAZINE HCL 100 MG PO TABS
100.0000 mg | ORAL_TABLET | Freq: Three times a day (TID) | ORAL | 3 refills | Status: DC
Start: 2023-09-18 — End: 2024-02-20

## 2023-09-18 MED ORDER — HYDRALAZINE HCL 100 MG PO TABS: 100.0000 mg | ORAL_TABLET | Freq: Three times a day (TID) | ORAL | 3 refills | Status: DC

## 2023-09-18 MED ORDER — HYDROCODONE BIT-HOMATROP MBR 5-1.5 MG/5ML PO SOLN: 5.0000 mL | Freq: Four times a day (QID) | ORAL | 0 refills | Status: AC | PRN

## 2023-09-18 NOTE — Telephone Encounter (Signed)
 Copied from CRM [id number]. Topic: Clinical - Medication Refill >> Sep 18, 2023  9:16 AM Florestine Avers wrote: Most Recent Primary Care Visit:  Provider: Corwin Levins  Department: [facility] [place]  Visit Type: HOSPITAL FOLLOW UP  Date: 08/29/2023  Medication: hydrALAZINE (APRESOLINE) 100 MG tablet  Has the patient contacted their pharmacy? Yes (Agent: If no, request that the patient contact the pharmacy for the refill. If patient does not wish to contact the pharmacy document the reason why and proceed with request.) (Agent: If yes, when and what did the pharmacy advise?)  Is this the correct pharmacy for this prescription? Yes If no, delete pharmacy and type the correct one.  This is the patient's preferred pharmacy:  [facility] #[id number] [place], [place] - [phone] AT [facility] OF [facility] & [address] [place] [address] [place] [address] Phone: [phone] Fax: [phone]  Has the prescription been filled recently? Yes  Is the patient out of the medication? Yes  Has the patient been seen for an appointment in the last year OR does the patient have an upcoming appointment? Yes  Can we respond through MyChart? Yes  Agent: Please be advised that Rx refills may take up to 3 business days. We ask that you follow-up with your pharmacy.

## 2023-09-18 NOTE — Telephone Encounter (Signed)
 Pt wants hydrocodone for cough

## 2023-09-18 NOTE — Telephone Encounter (Signed)
 Copied from CRM [id number]. Topic: Clinical - Medication Refill >> Sep 18, 2023  9:22 AM Florestine Avers wrote: Most Recent Primary Care Visit:  Provider: Corwin Levins  Department: [facility] [place]  Visit Type: HOSPITAL FOLLOW UP  Date: 08/29/2023  Medication: Hydrocodone for Cough Patients friend Janine Limbo states that Dr. Jonny Ruiz was supposed to prescribe this medication at the patients last appointment on 2/13 and they still have yet to receive the medication.  Has the patient contacted their pharmacy? Yes (Agent: If no, request that the patient contact the pharmacy for the refill. If patient does not wish to contact the pharmacy document the reason why and proceed with request.) (Agent: If yes, when and what did the pharmacy advise?)  Is this the correct pharmacy for this prescription? Yes If no, delete pharmacy and type the correct one.  This is the patient's preferred pharmacy:  [facility] #[id number] [place], [place] - [address] AT [facility] OF [facility] & [address] [place] [address] [place] [address] Phone: [phone] Fax: [phone]  [facility] - [place], [place] - One [facility] AT Portal to Registered [address] [place] [address] Phone: [phone] Fax: [phone]  [facility] - [place], [place] - [place] [address] [place] [address] Phone: [phone] Fax: [phone]  [facility] #[id number] - [address] [place] - [address] AT [facility] OF [address] & [facility] [address] [facility] [address] [place] [address] Phone: [phone] Fax: [phone]  [facility] #[id number] [place], [place] - [address] AT [facility] [address] [place] [address] Phone: [phone] Fax: [phone]   Has the prescription been filled recently? No  Is the patient out of the medication? Yes  Has the patient been seen for an appointment in the  last year OR does the patient have an upcoming appointment? Yes  Can we respond through MyChart? Yes  Agent: Please be advised that Rx refills may take up to 3 business days. We ask that you follow-up with your pharmacy.   Chief Complaint: Prescription Issue   Disposition: [] ED /[] Urgent Care (no appt availability in office) / [] Appointment(In office/virtual)/ []  [place] [facility]/ [] Home Care/ [] Refused Recommended Disposition /[] [facility]/ [x]  Follow-up with PCP Additional Notes: SB called regarding a missing prescription for Hydrocodan that was originally supposed to be prescribed on 08-29-2023. Contacted the CAL for clarification, informed the clinical team would reach back out to the patient regarding next steps. The patient's representative stated she has also been out of her Hydralazine for two weeks, pended medication for clinical team review.

## 2023-09-25 ENCOUNTER — Telehealth: Payer: Self-pay

## 2023-09-25 NOTE — Telephone Encounter (Unsigned)
 Copied from CRM [id number]. Topic: General - Other >> Sep 25, 2023  8:24 AM Truddie Crumble wrote: Reason for CRM: marsha from [facility] called stating she is going to put in an early discharge for next week for occupational therapy. Patient has a met her goals for this therapy but she is still doing speech and physical CB [phone]

## 2023-09-25 NOTE — Telephone Encounter (Signed)
Ok this is noted 

## 2023-10-02 ENCOUNTER — Telehealth: Payer: Self-pay | Admitting: Internal Medicine

## 2023-10-02 NOTE — Telephone Encounter (Signed)
 Copied from CRM [id number]. Topic: General - Other >> Oct 02, 2023  3:17 PM Florestine Avers wrote: Reason for CRM: [facility] home health called and wanted to know if the office received the fax for a lift chair request. It was requested on 09/13/2023 and has yet to be called in. Melissa from [facility] asking for a call back to confirm the fax was received and can be reached at [phone]

## 2023-10-03 ENCOUNTER — Telehealth: Payer: Self-pay | Admitting: Internal Medicine

## 2023-10-03 NOTE — Telephone Encounter (Signed)
 Copied from CRM #[id number]. Topic: Clinical - Home Health Verbal Orders >> Oct 03, 2023  2:06 PM Marica Otter wrote: Caller/Agency: Vickii Chafe Number: [phone] Secure Line Service Requested: To move home health st eval that connects care Medicare week due to patient scheduling conflict. Frequency: Evaluation Any new concerns about the patient? No

## 2023-10-04 NOTE — Telephone Encounter (Signed)
 Ok Industrial/product designer

## 2023-10-07 ENCOUNTER — Ambulatory Visit: Admitting: Obstetrics and Gynecology

## 2023-10-07 NOTE — Telephone Encounter (Signed)
 Left voicemail giving verbals.

## 2023-10-15 ENCOUNTER — Ambulatory Visit (INDEPENDENT_AMBULATORY_CARE_PROVIDER_SITE_OTHER): Admitting: Obstetrics and Gynecology

## 2023-10-15 ENCOUNTER — Encounter: Payer: Self-pay | Admitting: Obstetrics and Gynecology

## 2023-10-15 VITALS — BP 120/78 | HR 57

## 2023-10-15 DIAGNOSIS — N898 Other specified noninflammatory disorders of vagina: Secondary | ICD-10-CM | POA: Diagnosis not present

## 2023-10-15 DIAGNOSIS — R8290 Unspecified abnormal findings in urine: Secondary | ICD-10-CM

## 2023-10-15 MED ORDER — CEFTRIAXONE SODIUM 1 G IJ SOLR
1.0000 g | Freq: Once | INTRAMUSCULAR | Status: AC
  Administered 2023-10-15: 1 g via INTRAMUSCULAR

## 2023-10-15 NOTE — Progress Notes (Unsigned)
 Pt was given 1gm of rocephin. Patient waited 10 minutes with no reactions.

## 2023-10-16 MED ORDER — NITROFURANTOIN MONOHYD MACRO 100 MG PO CAPS: 100.0000 mg | ORAL_CAPSULE | Freq: Every day | ORAL | 3 refills | Status: AC

## 2023-10-16 NOTE — Progress Notes (Signed)
   Acute Office Visit  Subjective:    Patient ID: Tara [name], female    DOB: [birth year], 73 y.o.   MRN: [id number]   HPI 73 y.o. presents today for uti & swollen labia (Uti recheck, vaginal discharge& swollen rt labia//jj) .patient with incontinence and with small voids and suprapubic pain. No fevers or upperback pain.   No LMP recorded. Patient is postmenopausal.   OZH:YQMVH labial with no lesions but larger size compared with left. No erythema Has been in wheelchair Clean catch urine collected. Very cloudy with obvious infection and small amount collected Area cleaned with betadine prior to clean catch  Review of Systems     Objective:    OBGyn Exam  BP 120/78   Pulse (!) 57   SpO2 98%  Wt Readings from Last 3 Encounters:  09/03/23 215 lb (97.5 kg)  08/29/23 215 lb (97.5 kg)  03/13/23 186 lb (84.4 kg)        Patient informed chaperone available to be present for breast and/or pelvic exam. Patient has requested no chaperone to be present. Patient has been advised what will be completed during breast and pelvic exam.   Assessment & Plan:  UTI Rocephin 1 gram IM given. May need to start a different antibiotic based on culture.  Due to high risks and recurrence of uti's to begin night suppression with macrobid 100mg   2. Conservative care with labial swelling and elevate feet at night. 3.  Concerning s/s of pyelo discussed with patient care taker and when to return was discussed.  Dr. Arman Filter Tresa Res

## 2023-10-17 ENCOUNTER — Encounter: Payer: Self-pay | Admitting: Obstetrics and Gynecology

## 2023-10-17 LAB — WET PREP FOR TRICH, YEAST, CLUE

## 2023-10-17 LAB — URINE CULTURE
MICRO NUMBER:: 16273941
SPECIMEN QUALITY:: ADEQUATE

## 2023-10-17 NOTE — Addendum Note (Signed)
 Addended by: Earley Favor on: 10/17/2023 10:27 AM   Modules accepted: Orders

## 2023-10-18 ENCOUNTER — Other Ambulatory Visit: Payer: Self-pay

## 2023-10-18 DIAGNOSIS — B9689 Other specified bacterial agents as the cause of diseases classified elsewhere: Secondary | ICD-10-CM

## 2023-10-18 MED ORDER — METRONIDAZOLE 0.75 % VA GEL: 1.0000 | Freq: Every day | VAGINAL | 0 refills | Status: AC

## 2023-10-29 ENCOUNTER — Telehealth: Payer: Self-pay | Admitting: Internal Medicine

## 2023-10-29 NOTE — Telephone Encounter (Signed)
 Copied from CRM [id number]. Topic: Appointments - Transfer of Care >> Oct 29, 2023 12:33 PM Shelby Dessert H wrote: Pt is requesting to transfer FROM: [facility]. Roslyn Coombe Pt is requesting to transfer TO: A provider at or NP at [facility] Reason for requested transfer: Patient caregiver Bridgette Campus is not satisfied with the care the patient is receiving, patient has a lot going on and she needs someone to give her the proper care and patience that the patients need.  It is the responsibility of the team the patient would like to transfer to (Dr. Roselie Conger or [place]) to reach out to the patient if for any reason this transfer is not acceptable. Bridgette Campus from [facility] can be called back at [phone], Bridgette Campus also has some questions to ask before selecting a provider.

## 2023-10-29 NOTE — Telephone Encounter (Signed)
 Ok with me

## 2023-10-30 ENCOUNTER — Other Ambulatory Visit: Payer: Self-pay

## 2023-10-30 ENCOUNTER — Other Ambulatory Visit: Payer: Self-pay | Admitting: Internal Medicine

## 2023-11-04 ENCOUNTER — Other Ambulatory Visit: Payer: Self-pay | Admitting: Internal Medicine

## 2023-11-04 MED ORDER — DILTIAZEM HCL 60 MG PO TABS: 60.0000 mg | ORAL_TABLET | Freq: Three times a day (TID) | ORAL | 0 refills | Status: DC | PRN

## 2023-11-04 MED ORDER — GABAPENTIN 100 MG PO CAPS: 100.0000 mg | ORAL_CAPSULE | Freq: Three times a day (TID) | ORAL | 5 refills | Status: DC

## 2023-11-04 MED ORDER — OMEPRAZOLE 40 MG PO CPDR: 40.0000 mg | DELAYED_RELEASE_CAPSULE | Freq: Every day | ORAL | 3 refills | Status: DC

## 2023-11-04 MED ORDER — BRIMONIDINE TARTRATE-TIMOLOL 0.2-0.5 % OP SOLN: 1.0000 [drp] | Freq: Two times a day (BID) | OPHTHALMIC | 0 refills | Status: AC

## 2023-11-04 NOTE — Telephone Encounter (Signed)
 Copied from CRM [id number]. Topic: Clinical - Medication Refill >> Nov 04, 2023 10:23 AM Juluis Ok wrote: Most Recent Primary Care Visit:  Provider: Roslyn Coombe  Department: [facility]  Visit Type: HOSPITAL FOLLOW UP  Date: 08/29/2023  Medication: gabapentin  (NEURONTIN ) 100 MG capsule omeprazole  (PRILOSEC) 40 MG capsule diltiazem  (CARDIZEM ) 60 MG tablet brimonidine -timolol  (COMBIGAN ) 0.2-0.5 % ophthalmic solution    Has the patient contacted their pharmacy? Yes (Agent: If no, request that the patient contact the pharmacy for the refill. If patient does not wish to contact the pharmacy document the reason why and proceed with request.) (Agent: If yes, when and what did the pharmacy advise?)  Is this the correct pharmacy for this prescription? Yes If no, delete pharmacy and type the correct one.  This is the patient's preferred pharmacy:  [id number] [place], [place] - [phone] AT [facility] OF [facility] & [address] [place] [address] Phone: [phone] Fax: [phone]       Has the prescription been filled recently? No  Is the patient out of the medication? Yes  Has the patient been seen for an appointment in the last year OR does the patient have an upcoming appointment? Yes  Can we respond through MyChart? Yes  Agent: Please be advised that Rx refills may take up to 3 business days. We ask that you follow-up with your pharmacy.

## 2023-11-20 ENCOUNTER — Other Ambulatory Visit: Payer: Self-pay

## 2023-11-20 ENCOUNTER — Other Ambulatory Visit: Payer: Self-pay | Admitting: Internal Medicine

## 2023-11-20 MED ORDER — ALBUTEROL SULFATE HFA 108 (90 BASE) MCG/ACT IN AERS: 2.0000 | INHALATION_SPRAY | Freq: Four times a day (QID) | RESPIRATORY_TRACT | 5 refills | Status: DC | PRN

## 2023-11-20 MED ORDER — BACLOFEN 10 MG PO TABS: 5.0000 mg | ORAL_TABLET | Freq: Three times a day (TID) | ORAL | 0 refills | Status: DC

## 2023-11-20 NOTE — Telephone Encounter (Unsigned)
 Copied from CRM [id number]. Topic: Clinical - Medication Refill >> Nov 20, 2023 10:10 AM Bridgette Campus T wrote: Medication: albuterol  (VENTOLIN  HFA) 108 (90 Base) MCG/ACT inhaler baclofen  (LIORESAL ) 10 MG tablet  Has the patient contacted their pharmacy? Yes (Agent: If no, request that the patient contact the pharmacy for the refill. If patient does not wish to contact the pharmacy document the reason why and proceed with request.) (Agent: If yes, when and what did the pharmacy advise?)  This is the patient's preferred pharmacy:  [id number] [place], [place] - [address] AT [facility] OF [facility] & [address] [place] [address] Phone: [phone] Fax: [phone]   Is this the correct pharmacy for this prescription? Yes If no, delete pharmacy and type the correct one.   Has the prescription been filled recently? Yes  Is the patient out of the medication? Yes  Has the patient been seen for an appointment in the last year OR does the patient have an upcoming appointment? Yes  Can we respond through MyChart? Yes  Agent: Please be advised that Rx refills may take up to 3 business days. We ask that you follow-up with your pharmacy.

## 2023-11-20 NOTE — Telephone Encounter (Signed)
 Last Fill: Albuterol : 01/02/23     Baclofen : 05/24/23  Last OV: 08/29/23 Next OV: 02/26/24  Routing to provider for review/authorization.

## 2023-12-02 ENCOUNTER — Ambulatory Visit: Admitting: Family

## 2023-12-03 ENCOUNTER — Ambulatory Visit (INDEPENDENT_AMBULATORY_CARE_PROVIDER_SITE_OTHER)

## 2023-12-03 VITALS — Ht 66.0 in | Wt 215.0 lb

## 2023-12-03 DIAGNOSIS — Z0000 Encounter for general adult medical examination without abnormal findings: Secondary | ICD-10-CM

## 2023-12-03 DIAGNOSIS — E1165 Type 2 diabetes mellitus with hyperglycemia: Secondary | ICD-10-CM

## 2023-12-03 DIAGNOSIS — T380X5D Adverse effect of glucocorticoids and synthetic analogues, subsequent encounter: Secondary | ICD-10-CM | POA: Diagnosis not present

## 2023-12-03 DIAGNOSIS — E099 Drug or chemical induced diabetes mellitus without complications: Secondary | ICD-10-CM

## 2023-12-03 DIAGNOSIS — Z1231 Encounter for screening mammogram for malignant neoplasm of breast: Secondary | ICD-10-CM

## 2023-12-03 NOTE — Patient Instructions (Signed)
 Ms. Deutschman , Thank you for taking time out of your busy schedule to complete your Annual Wellness Visit with me. I enjoyed our conversation and look forward to speaking with you again next year. I, as well as your care team,  appreciate your ongoing commitment to your health goals. Please review the following plan we discussed and let me know if I can assist you in the future. Your Game plan/ To Do List    Referrals: If you haven't heard from the office you've been referred to, please reach out to them at the phone provided.  You have an order for:  [x]   3D Mammogram      Please call for appointment:  [facility] [address] [place], [place] [address] [phone]  Make sure to wear two-piece clothing.  No lotions, powders, or deodorants the day of the appointment. Make sure to bring picture ID and insurance card.  Bring list of medications you are currently taking including any supplements.   Follow up Visits: Next Medicare AWV with our clinical staff: 12/03/2024.   Have you seen your provider in the last 6 months (3 months if uncontrolled diabetes)? Yes Next Office Visit with your provider: 02/26/2024.  Clinician Recommendations:  Aim for 30 minutes of exercise or brisk walking, 6-8 glasses of water , and 5 servings of fruits and vegetables each day. You are due for a Shingles vaccine and can get it at your local pharmacy.  You are also due for a foot exam and a kidney evaluation and can get that done during your next office visit.        This is a list of the screening recommended for you and due dates:  Health Maintenance  Topic Date Due   Zoster (Shingles) Vaccine (1 of 2) 12/01/2002   Yearly kidney health urinalysis for diabetes  10/06/2021   Eye exam for diabetics  07/31/2022   COVID-19 Vaccine (6 - 2024-25 season) 03/17/2023   Mammogram  11/03/2023   Complete foot exam   11/06/2023   Flu Shot  02/14/2024   Hemoglobin A1C  02/26/2024   Yearly  kidney function blood test for diabetes  03/12/2024   Pap Smear  03/31/2024   Colon Cancer Screening  11/22/2024   Medicare Annual Wellness Visit  12/02/2024   DTaP/Tdap/Td vaccine (4 - Td or Tdap) 03/18/2030   Pneumonia Vaccine  Completed   DEXA scan (bone density measurement)  Completed   Hepatitis C Screening  Completed   HPV Vaccine  Aged Out   Meningitis B Vaccine  Aged Out    Advanced directives: (In Chart) A copy of your advanced directives are scanned into your chart should your provider ever need it. Advance Care Planning is important because it:  [x]  Makes sure you receive the medical care that is consistent with your values, goals, and preferences  [x]  It provides guidance to your family and loved ones and reduces their decisional burden about whether or not they are making the right decisions based on your wishes.  Follow the link provided in your after visit summary or read over the paperwork we have mailed to you to help you started getting your Advance Directives in place. If you need assistance in completing these, please reach out to us  so that we can help you!  See attachments for Preventive Care and Fall Prevention Tips.

## 2023-12-03 NOTE — Progress Notes (Addendum)
 Subjective:   Tara [name] is a 73 y.o. who presents for a Medicare Wellness preventive visit.  As a reminder, Annual Wellness Visits don't include a physical exam, and some assessments may be limited, especially if this visit is performed virtually. We may recommend an in-person follow-up visit with your provider if needed.  Visit Complete: Virtual I connected with  Tara [name] on 12/06/23 by a video and audio enabled telemedicine application and verified that I am speaking with the correct person using two identifiers.  Patient Location: Home  Provider Location: Home Office  I discussed the limitations of evaluation and management by telemedicine. The patient expressed understanding and agreed to proceed.  Vital Signs: Because this visit was a virtual/telehealth visit, some criteria may be missing or patient reported. Any vitals not documented were not able to be obtained and vitals that have been documented are patient reported.  Persons Participating in Visit: Printmaker) and patient was present during visit.  AWV Questionnaire: No: Patient Medicare AWV questionnaire was not completed prior to this visit.  Cardiac Risk Factors include: advanced age (>74men, >14 women);hypertension;Other (see comment);diabetes mellitus;dyslipidemia, Risk factor comments: CKD stage 4     Objective:     Today's Vitals   12/03/23 1536  Weight: 215 lb (97.5 kg)  Height: 5\' 6"  (1.676 m)   Body mass index is 34.7 kg/m.     12/03/2023    3:53 PM 03/13/2023    4:10 PM 11/21/2022    6:35 PM 06/23/2022    7:44 PM 06/14/2022    6:00 PM 04/22/2022    7:39 PM 04/02/2022    5:04 PM  Advanced Directives  Does Patient Have a Medical Advance Directive? Yes No No  Yes Yes No  Type of Estate agent of [place];Living will    Living will Healthcare Power of Attorney   Does patient want to make changes to medical advance directive? No - Patient declined   No - Patient declined No  - Patient declined    Copy of Healthcare Power of Attorney in Chart? Yes - validated most recent copy scanned in chart (See row information)        Would patient like information on creating a medical advance directive?  No - Patient declined     No - Patient declined    Current Medications (verified) Outpatient Encounter Medications as of 12/03/2023  Medication Sig   acetaminophen  (TYLENOL ) 650 MG CR tablet Take 650 mg by mouth every 8 (eight) hours as needed for pain.   albuterol  (VENTOLIN  HFA) 108 (90 Base) MCG/ACT inhaler Inhale 2 puffs into the lungs every 6 (six) hours as needed for wheezing or shortness of breath.   Amantadine HCl 100 MG tablet Take by mouth.   apixaban  (ELIQUIS ) 5 MG TABS tablet Place 1 tablet (5 mg total) into feeding tube 2 (two) times daily.   baclofen  (LIORESAL ) 10 MG tablet Take 0.5 tablets (5 mg total) by mouth 3 (three) times daily.   bisacodyl  (DULCOLAX) 10 MG suppository Place 1 suppository (10 mg total) rectally as needed for moderate constipation.   brimonidine -timolol  (COMBIGAN ) 0.2-0.5 % ophthalmic solution Place 1 drop into both eyes every 12 (twelve) hours.   cholecalciferol  (CHOLECALCIFEROL ) 25 MCG tablet Place 1 tablet (1,000 Units total) into feeding tube daily.   Cholecalciferol  (VITAMIN D -3 PO) Take 1 tablet by mouth every morning.   clorazepate  (TRANXENE ) 7.5 MG tablet TAKE 1 TABLET(7.5 MG) BY MOUTH TWICE DAILY AS NEEDED FOR ANXIETY   desvenlafaxine  (  PRISTIQ ) 100 MG 24 hr tablet TAKE 1 TABLET(100 MG) BY MOUTH AT BEDTIME   diltiazem  (CARDIZEM ) 60 MG tablet Take 1 tablet (60 mg total) by mouth every 8 (eight) hours as needed. TAKE 1 TABLET BY FEEDING TUBE EVERY 8 HOURS AS NEEDED   fluticasone  (FLONASE ) 50 MCG/ACT nasal spray Place 2 sprays into both nostrils daily.   gabapentin  (NEURONTIN ) 100 MG capsule Take 1 capsule (100 mg total) by mouth 3 (three) times daily.   hydrALAZINE  (APRESOLINE ) 100 MG tablet Place 1 tablet (100 mg total) into feeding tube  3 (three) times daily.   ipratropium-albuterol  (DUONEB) 0.5-2.5 (3) MG/3ML SOLN Inhale into the lungs.   isosorbide  dinitrate (ISORDIL ) 10 MG tablet PLACE 1 TABLET INTO FEEDING TUBE THREE TIMES DAILY   latanoprost  (XALATAN ) 0.005 % ophthalmic solution Place 1 drop into both eyes at bedtime.   Multiple Vitamins-Minerals (MULTIVITAMIN WITH MINERALS) tablet Place 1 tablet into feeding tube daily.   nitrofurantoin , macrocrystal-monohydrate, (MACROBID ) 100 MG capsule Take 1 capsule (100 mg total) by mouth at bedtime. Suppression dosing to prevent a bladder infection   Nutritional Supplements (FEEDING SUPPLEMENT, OSMOLITE 1.5 CAL,) LIQD 60 mLs by Per J Tube route continuous.   omeprazole  (PRILOSEC) 40 MG capsule Take 1 capsule (40 mg total) by mouth daily.   valbenazine  (INGREZZA ) 60 MG capsule Take 1 capsule (60 mg total) by mouth daily. (Patient taking differently: Patient takes 40 mg by mouth daily.)   zaleplon (SONATA) 10 MG capsule Take 20 mg by mouth at bedtime.   zolpidem  (AMBIEN ) 5 MG tablet Take 1 tablet (5 mg total) by mouth at bedtime as needed for sleep.   No facility-administered encounter medications on file as of 12/03/2023.    Allergies (verified) Sulfa antibiotics, Fluoxetine hcl, Cocoa, Eicosapentaenoic acid, and Omega-3 fatty acids (plant)   History: Past Medical History:  Diagnosis Date   Anemia    on meds   ANKLE PAIN, LEFT 04/01/2008   ANXIETY 04/17/2007   on meds   Arthritis    generalized   Cataract    bilateral sx   Chronic kidney disease    stage 3   Colon polyp    COLONIC POLYPS, HX OF 08/01/2007   CONTUSIONS, MULTIPLE 04/01/2009   DEPRESSION 04/17/2007   on meds   DIZZINESS 08/01/2007   DYSPNEA 08/01/2007   with exertion   Enlargement of lymph nodes 08/13/2007   Excessive involuntary blinking    per pt,going on since 2018   Glaucoma    on meds   GLUCOSE INTOLERANCE 08/01/2007   Hypercalcemia due to sarcoidosis 2014   HYPERLIPIDEMIA 08/01/2007   no  meds   HYPERSOMNIA 07/28/2008   HYPERTENSION 04/17/2007   Impaired glucose tolerance 03/23/2011   JOINT EFFUSION, LEFT KNEE 06/02/2010   Loose body in knee 04/01/2009   pt not sure?   Metabolic encephalopathy 12/09/2015-12/2015   Migraines    "stopped 3-4 yr ago" (07/23/2012)   Morbid obesity (HCC) 04/20/2007   OTHER DISEASES OF LUNG NOT ELSEWHERE CLASSIFIED 08/01/2007   Pain in joint, lower leg 04/01/2009   PERIPHERAL EDEMA 04/21/2009   Pre-diabetes    Sarcoidosis 09/25/2007   Seasonal allergies    SHOULDER PAIN, LEFT 04/01/2009   Sleep apnea    does not use Cpap   Past Surgical History:  Procedure Laterality Date   BREAST SURGERY     Biopsy benign/bil breaST   CARDIOVERSION N/A 12/15/2021   Procedure: CARDIOVERSION;  Surgeon: Maudine Sos, MD;  Location: [facility] ENDOSCOPY;  Service: Cardiovascular;  Laterality: N/A;   CARDIOVERSION N/A 02/09/2022   Procedure: CARDIOVERSION;  Surgeon: Hazle Lites, MD;  Location: [facility] ENDOSCOPY;  Service: Cardiovascular;  Laterality: N/A;   CATARACT EXTRACTION     RIGHT EYE   COLONOSCOPY  2020   KN-MAC-suprep(good)-tics/TA's   COMBINED MEDIASTINOSCOPY AND BRONCHOSCOPY  08/2007   COMBINED MEDIASTINOSCOPY AND BRONCHOSCOPY  2009   Dental implant     FRACTURE SURGERY  ?02/1997   "left upper arm; put rod in" (07/23/2012)   GUM SURGERY  2000-?2009   "several ORs; soft tissue graft; took material from roof of mouth" (07/23/2012   HUMERUS SURGERY Left 1998   rod insertion   IR GASTROSTOMY TUBE MOD SED  06/22/2022   IR REPLC GASTRO/COLONIC TUBE PERCUT W/FLUORO  10/25/2022   KNEE ARTHROSCOPY  03/2004; 06/2009   "right; left" Dr. Hazeline Lister   KNEE SURGERY  2012   ARTHROSCOPIC LEFT KNEE   LYMPH NODE BIOPSY  ~ 2009   "for sarcoidosis; don't know exactly which nodes" (07/23/2102)   MYOMECTOMY  1994   Open   POLYPECTOMY  2020   TA's   REFRACTIVE SURGERY  08/1998   "both eyes" (07/23/2012)   REFRACTIVE SURGERY  2000   Bil   TOTAL KNEE ARTHROPLASTY Left  09/24/2016   Procedure: TOTAL KNEE ARTHROPLASTY;  Surgeon: Neil Balls, MD;  Location: [facility] OR;  Service: Orthopedics;  Laterality: Left;   Family History  Problem Relation Age of Onset   Diabetes Mother    Hypertension Mother    Stroke Mother    Heart disease Father 61   Hypertension Father    Hypertension Sister    Asthma Sister    Gout Sister    Colon cancer Neg Hx    Breast cancer Neg Hx    Esophageal cancer Neg Hx    Stomach cancer Neg Hx    Rectal cancer Neg Hx    Colon polyps Neg Hx    Social History   Socioeconomic History   Marital status: Widowed    Spouse name: Not on file   Number of children: Not on file   Years of education: Not on file   Highest education level: Not on file  Occupational History   Occupation: Best boy  Tobacco Use   Smoking status: Never   Smokeless tobacco: Never   Tobacco comments:    Never smoke 12/25/21  Vaping Use   Vaping status: Never Used  Substance and Sexual Activity   Alcohol  use: Not Currently   Drug use: No   Sexual activity: Not Currently    Birth control/protection: Post-menopausal    Comment: 1st intercourse 58 yo-1 partner  Other Topics Concern   Not on file  Social History Narrative   Lives with 3 cats/and caregiver Lanny Plan)   Social Drivers of Health   Financial Resource Strain: Low Risk  (12/03/2023)   Overall Financial Resource Strain (CARDIA)    Difficulty of Paying Living Expenses: Not hard at all  Food Insecurity: No Food Insecurity (12/03/2023)   Hunger Vital Sign    Worried About Running Out of Food in the Last Year: Never true    Ran Out of Food in the Last Year: Never true  Transportation Needs: No Transportation Needs (12/03/2023)   PRAPARE - Administrator, Civil Service (Medical): No    Lack of Transportation (Non-Medical): No  Physical Activity: Insufficiently Active (12/03/2023)   Exercise Vital Sign    Days of Exercise per Week: 7 days  Minutes of Exercise per Session: 10  min  Stress: No Stress Concern Present (12/03/2023)   [employer] of Occupational Health - Occupational Stress Questionnaire    Feeling of Stress : Not at all  Social Connections: Socially Isolated (12/03/2023)   Social Connection and Isolation Panel [NHANES]    Frequency of Communication with Friends and Family: Once a week    Frequency of Social Gatherings with Friends and Family: Never    Attends Religious Services: Never    Database administrator or Organizations: No    Attends Banker Meetings: Never    Marital Status: Widowed    Tobacco Counseling Counseling given: Not Answered Tobacco comments: Never smoke 12/25/21    Clinical Intake:  Pre-visit preparation completed: Yes  Pain : No/denies pain     BMI - recorded: 34.7 Nutritional Status: BMI > 30  Obese Diabetes: Yes CBG done?: No Did pt. bring in CBG monitor from home?: No  Lab Results  Component Value Date   HGBA1C 5.3 08/29/2023   HGBA1C 5.3 03/07/2023   HGBA1C 5.7 11/06/2022     How often do you need to have someone help you when you read instructions, pamphlets, or other written materials from your doctor or pharmacy?: 1 - Never     Information entered by :: Merlin Ege, RMA   Activities of Daily Living     12/03/2023    3:37 PM  In your present state of health, do you have any difficulty performing the following activities:  Hearing? 0  Vision? 0  Difficulty concentrating or making decisions? 0  Walking or climbing stairs? 1  Dressing or bathing? 1  Doing errands, shopping? 1  Comment transportation Chiropodist and eating ? N  Using the Toilet? N  In the past six months, have you accidently leaked urine? Y  Do you have problems with loss of bowel control? N  Managing your Medications? Y  Managing your Finances? Y  Housekeeping or managing your Housekeeping? Y    Patient Care Team: Roslyn Coombe, MD as PCP - General Branch, Tomas Fountain, MD as PCP - Cardiology  (Cardiology) Rudine Cos, MD as Consulting Physician (Ophthalmology)  Indicate any recent Medical Services you may have received from other than [facility] providers in the past year (date may be approximate).     Assessment:    This is a routine wellness examination for Sharae.  Hearing/Vision screen Hearing Screening - Comments:: Denies hearing difficulties   Vision Screening - Comments:: Denies vision issues.    Goals Addressed               This Visit's Progress     Patient Stated (pt-stated)        Not at this time.  2025       Depression Screen     12/03/2023    3:58 PM 08/29/2023   10:45 AM 03/07/2023    9:58 AM 01/29/2023    1:32 PM 11/27/2022   10:53 AM 11/06/2022    9:41 AM 10/10/2021    9:36 AM  PHQ 2/9 Scores  PHQ - 2 Score 4 0 1 1 0 0 1  PHQ- 9 Score 7 0 1  0 3     Fall Risk     12/03/2023    3:54 PM 08/29/2023   11:08 AM 04/01/2023   10:09 AM 03/07/2023    9:58 AM 01/29/2023    1:32 PM  Fall Risk   Falls in  the past year? 0 0 0 1 1  Number falls in past yr: 0 0 0 1 0  Injury with Fall? 0 0 0 0 0  Risk for fall due to : Impaired balance/gait No Fall Risks No Fall Risks History of fall(s);Impaired balance/gait;Impaired mobility History of fall(s)  Follow up Falls evaluation completed;Falls prevention discussed Falls evaluation completed Falls evaluation completed Falls evaluation completed Falls evaluation completed    MEDICARE RISK AT HOME:  Medicare Risk at Home Any stairs in or around the home?: No If so, are there any without handrails?: No Home free of loose throw rugs in walkways, pet beds, electrical cords, etc?: Yes Adequate lighting in your home to reduce risk of falls?: Yes Life alert?: No Use of a cane, walker or w/c?: Yes (w/c) Grab bars in the bathroom?: Yes Shower chair or bench in shower?: Yes Elevated toilet seat or a handicapped toilet?: Yes  TIMED UP AND GO:  Was the test performed?  No  Cognitive Function: Declined/Normal: No  cognitive concerns noted by patient or family. Patient alert, oriented, able to answer questions appropriately and recall recent events. No signs of memory loss or confusion.        Immunizations Immunization History  Administered Date(s) Administered   Fluad Quad(high Dose 65+) 03/11/2019, 05/27/2020, 04/11/2021   Fluad Trivalent(High Dose 65+) 08/29/2023   Influenza Split 03/29/2013   Influenza Whole 05/09/1999, 05/19/2007, 04/01/2008, 04/21/2009, 03/26/2011   Influenza, High Dose Seasonal PF 03/27/2018, 05/25/2019   Influenza,inj,Quad PF,6+ Mos 03/25/2014, 03/27/2016, 06/13/2017   Influenza-Unspecified 04/15/2012, 04/14/2015   Moderna Sars-Covid-2 Vaccination 08/28/2019, 09/25/2019, 11/30/2019, 05/27/2020, 11/25/2020   Pneumococcal Conjugate-13 03/18/2020   Pneumococcal Polysaccharide-23 06/05/2013, 10/10/2021   Td 07/16/1992, 10/21/2008   Tdap 03/18/2020   Zoster, Live 12/14/2012    Screening Tests Health Maintenance  Topic Date Due   Zoster Vaccines- Shingrix (1 of 2) 12/01/2002   Diabetic kidney evaluation - Urine ACR  10/06/2021   OPHTHALMOLOGY EXAM  07/31/2022   COVID-19 Vaccine (6 - 2024-25 season) 03/17/2023   MAMMOGRAM  11/03/2023   INFLUENZA VACCINE  02/14/2024   HEMOGLOBIN A1C  02/26/2024   Diabetic kidney evaluation - eGFR measurement  03/12/2024   Cervical Cancer Screening (Pap smear)  03/31/2024   Colonoscopy  11/22/2024   FOOT EXAM  12/02/2024   Medicare Annual Wellness (AWV)  12/02/2024   DTaP/Tdap/Td (4 - Td or Tdap) 03/18/2030   Pneumonia Vaccine 30+ Years old  Completed   DEXA SCAN  Completed   Hepatitis C Screening  Completed   HPV VACCINES  Aged Out   Meningococcal B Vaccine  Aged Out    Health Maintenance  Health Maintenance Due  Topic Date Due   Zoster Vaccines- Shingrix (1 of 2) 12/01/2002   Diabetic kidney evaluation - Urine ACR  10/06/2021   OPHTHALMOLOGY EXAM  07/31/2022   COVID-19 Vaccine (6 - 2024-25 season) 03/17/2023   MAMMOGRAM   11/03/2023   Health Maintenance Items Addressed: Mammogram ordered, Diabetic Foot Exam scheduled, UACR (Urine Albumin :Creatinine Ratio), See Nurse Notes  Additional Screening:  Vision Screening: Recommended annual ophthalmology exams for early detection of glaucoma and other disorders of the eye.  Dental Screening: Recommended annual dental exams for proper oral hygiene  Community Resource Referral / Chronic Care Management: CRR required this visit?  No   CCM required this visit?  No   Plan:    I have personally reviewed and noted the following in the patient's chart:   Medical and social history Use of  alcohol , tobacco or illicit drugs  Current medications and supplements including opioid prescriptions. Patient is not currently taking opioid prescriptions. Functional ability and status Nutritional status Physical activity Advanced directives List of other physicians Hospitalizations, surgeries, and ER visits in previous 12 months Vitals Screenings to include cognitive, depression, and falls Referrals and appointments  In addition, I have reviewed and discussed with patient certain preventive protocols, quality metrics, and best practice recommendations. A written personalized care plan for preventive services as well as general preventive health recommendations were provided to patient.   Chimaobi Casebolt L Kerstin Crusoe, CMA   12/06/2023   After Visit Summary: (MyChart) Due to this being a telephonic visit, the after visit summary with patients personalized plan was offered to patient via MyChart   Notes: Please refer to Routing Comments.

## 2023-12-10 ENCOUNTER — Ambulatory Visit (INDEPENDENT_AMBULATORY_CARE_PROVIDER_SITE_OTHER): Admitting: Family

## 2023-12-10 ENCOUNTER — Encounter: Payer: Self-pay | Admitting: Family

## 2023-12-10 VITALS — BP 118/76 | HR 70 | Temp 97.6°F | Ht 66.0 in | Wt 208.0 lb

## 2023-12-10 DIAGNOSIS — F411 Generalized anxiety disorder: Secondary | ICD-10-CM

## 2023-12-10 DIAGNOSIS — F320 Major depressive disorder, single episode, mild: Secondary | ICD-10-CM

## 2023-12-10 DIAGNOSIS — E538 Deficiency of other specified B group vitamins: Secondary | ICD-10-CM

## 2023-12-10 DIAGNOSIS — E559 Vitamin D deficiency, unspecified: Secondary | ICD-10-CM

## 2023-12-10 DIAGNOSIS — N184 Chronic kidney disease, stage 4 (severe): Secondary | ICD-10-CM | POA: Diagnosis not present

## 2023-12-10 DIAGNOSIS — E7800 Pure hypercholesterolemia, unspecified: Secondary | ICD-10-CM | POA: Diagnosis not present

## 2023-12-10 DIAGNOSIS — E1151 Type 2 diabetes mellitus with diabetic peripheral angiopathy without gangrene: Secondary | ICD-10-CM | POA: Insufficient documentation

## 2023-12-10 DIAGNOSIS — I482 Chronic atrial fibrillation, unspecified: Secondary | ICD-10-CM

## 2023-12-10 DIAGNOSIS — I10 Essential (primary) hypertension: Secondary | ICD-10-CM

## 2023-12-10 DIAGNOSIS — G20B1 Parkinson's disease with dyskinesia, without mention of fluctuations: Secondary | ICD-10-CM

## 2023-12-10 DIAGNOSIS — R1310 Dysphagia, unspecified: Secondary | ICD-10-CM | POA: Diagnosis not present

## 2023-12-10 DIAGNOSIS — D869 Sarcoidosis, unspecified: Secondary | ICD-10-CM

## 2023-12-10 DIAGNOSIS — H6122 Impacted cerumen, left ear: Secondary | ICD-10-CM

## 2023-12-10 DIAGNOSIS — G2401 Drug induced subacute dyskinesia: Secondary | ICD-10-CM

## 2023-12-10 MED ORDER — DEBROX 6.5 % OT SOLN: 5.0000 [drp] | Freq: Two times a day (BID) | OTIC | 0 refills | Status: AC

## 2023-12-10 NOTE — Progress Notes (Addendum)
 Provider: Christean Courts FNP-C   Bradford Cazier, Elijio Guadeloupe, NP  Patient Care Team: Aadith Raudenbush, Elijio Guadeloupe, NP as PCP - General (Family Medicine) Bridgette Campus, MD as PCP - Cardiology (Cardiology) Rudine Cos, MD as Consulting Physician (Ophthalmology)  Extended Emergency Contact Information Primary Emergency Contact: Tramel,Kiara  [place] Mobile Phone: [phone] Relation: Other Secondary Emergency Contact: Weiss,Patricia Address: [address] [place] [address] [place] Home Phone: [phone] Mobile Phone: [phone] Relation: Sister  Code Status:  Full Code  Goals of care: Advanced Directive information    12/10/2023    2:46 PM  Advanced Directives  Does Patient Have a Medical Advance Directive? Yes  Type of Estate agent of [place];Living will  Does patient want to make changes to medical advance directive? No - Patient declined  Copy of Healthcare Power of Attorney in Chart? Yes - validated most recent copy scanned in chart (See row information)     Chief Complaint  Patient presents with   Establish Care    New patient appointment.    Discussed the use of AI scribe software for clinical note transcription with the patient, who gave verbal consent to proceed.  History of Present Illness   Tara [name] is a 73 year old female with type 2 diabetes and chronic kidney disease who presents for medication review and management.  She has a history of type 2 diabetes, diagnosed after a period of hyperglycemia. She does not currently monitor her blood glucose levels at home, as previous healthcare providers did not emphasize the need for home glucose monitoring. On chart review, A1C was 5.7 one year ago but down to 5.3 three months ago.   She has chronic kidney disease, stage four, which is being monitored. Her hypertension is managed with hydroxyzine three times daily and latanoprost  which is noted to be for  her eyes. She takes apixaban  5 mg twice daily for atrial fibrillation.  She experiences anxiety and depression, for which she has regular radio consultations with a psychiatrist every six months. She denies having bipolar disorder, despite it being noted in her chart.  She has sarcoidosis and tardive dyskinesia, which affects her speech and swallowing. She has been seen by speech therapy for her swallowing difficulties and performs exercises to help manage this.  She uses a wheelchair for mobility but can ambulate short distances with a walker. Her left leg turns inward, a condition that developed about a year ago when she was bedridden. She has a feeding tube and uses Osmolite 1.5 for nutrition, administered every other day to manage her weight. She also consumes 'pleasure foods' like strawberry and watermelon.  She takes vitamin D3 2000 units, vitamin C, and a multivitamin supplement called Airborne. She is not currently taking Flonase  or Dulcolax. She has a history of allergic rhinitis, managed with over-the-counter Zyrtec  and Pataday  eye drops.  Her caregiver reports no recent pain, and she wears a brace on her left leg. No current pain in her knee or sores on her feet. She has not been diagnosed with Parkinson's disease and has not experienced any seizures or conversion episodes though both are indicated on previous records..  She has regular bowel movements and does not use Dulcolax. She wears diapers but is improving in controlling her bowels until reaching the commode.    Past Medical History:  Diagnosis Date   Anemia    on meds   ANKLE  PAIN, LEFT 04/01/2008   ANXIETY 04/17/2007   on meds   Arthritis    generalized   Cataract    bilateral sx   Chronic kidney disease    stage 3   Colon polyp    COLONIC POLYPS, HX OF 08/01/2007   CONTUSIONS, MULTIPLE 04/01/2009   DEPRESSION 04/17/2007   on meds   DIZZINESS 08/01/2007   DYSPNEA 08/01/2007   with exertion   Enlargement of  lymph nodes 08/13/2007   Excessive involuntary blinking    per pt,going on since 2018   Glaucoma    on meds   GLUCOSE INTOLERANCE 08/01/2007   Hypercalcemia due to sarcoidosis 2014   HYPERLIPIDEMIA 08/01/2007   no meds   HYPERSOMNIA 07/28/2008   HYPERTENSION 04/17/2007   Impaired glucose tolerance 03/23/2011   JOINT EFFUSION, LEFT KNEE 06/02/2010   Loose body in knee 04/01/2009   pt not sure?   Metabolic encephalopathy 12/09/2015-12/2015   Migraines    "stopped 3-4 yr ago" (07/23/2012)   Morbid obesity (HCC) 04/20/2007   OTHER DISEASES OF LUNG NOT ELSEWHERE CLASSIFIED 08/01/2007   Pain in joint, lower leg 04/01/2009   PERIPHERAL EDEMA 04/21/2009   Pre-diabetes    Sarcoidosis 09/25/2007   Seasonal allergies    SHOULDER PAIN, LEFT 04/01/2009   Sleep apnea    does not use Cpap   Past Surgical History:  Procedure Laterality Date   BREAST SURGERY     Biopsy benign/bil breaST   CARDIOVERSION N/A 12/15/2021   Procedure: CARDIOVERSION;  Surgeon: Maudine Sos, MD;  Location: [facility] ENDOSCOPY;  Service: Cardiovascular;  Laterality: N/A;   CARDIOVERSION N/A 02/09/2022   Procedure: CARDIOVERSION;  Surgeon: Hazle Lites, MD;  Location: [facility] ENDOSCOPY;  Service: Cardiovascular;  Laterality: N/A;   CATARACT EXTRACTION     RIGHT EYE   COLONOSCOPY  2020   KN-MAC-suprep(good)-tics/TA's   COMBINED MEDIASTINOSCOPY AND BRONCHOSCOPY  08/2007   COMBINED MEDIASTINOSCOPY AND BRONCHOSCOPY  2009   Dental implant     FRACTURE SURGERY  ?02/1997   "left upper arm; put rod in" (07/23/2012)   GUM SURGERY  2000-?2009   "several ORs; soft tissue graft; took material from roof of mouth" (07/23/2012   HUMERUS SURGERY Left 1998   rod insertion   IR GASTROSTOMY TUBE MOD SED  06/22/2022   IR REPLC GASTRO/COLONIC TUBE PERCUT W/FLUORO  10/25/2022   KNEE ARTHROSCOPY  03/2004; 06/2009   "right; left" Dr. Hazeline Lister   KNEE SURGERY  2012   ARTHROSCOPIC LEFT KNEE   LYMPH NODE BIOPSY  ~ 2009   "for sarcoidosis; don't  know exactly which nodes" (07/23/2102)   MYOMECTOMY  1994   Open   POLYPECTOMY  2020   TA's   REFRACTIVE SURGERY  08/1998   "both eyes" (07/23/2012)   REFRACTIVE SURGERY  2000   Bil   TOTAL KNEE ARTHROPLASTY Left 09/24/2016   Procedure: TOTAL KNEE ARTHROPLASTY;  Surgeon: Neil Balls, MD;  Location: [facility] OR;  Service: Orthopedics;  Laterality: Left;    Allergies  Allergen Reactions   Sulfa Antibiotics Rash   Fluoxetine Hcl Other (See Comments)    (PROZAC) Suicidal thoughts   Cocoa    Eicosapentaenoic Acid    Omega-3 Fatty Acids (Plant)     Allergies as of 12/10/2023       Reactions   Sulfa Antibiotics Rash   Fluoxetine Hcl Other (See Comments)   (PROZAC) Suicidal thoughts   Cocoa    Eicosapentaenoic Acid    Omega-3 Fatty Acids (plant)  Medication List        Accurate as of Dec 10, 2023  9:23 PM. If you have any questions, ask your nurse or doctor.          STOP taking these medications    acetaminophen  650 MG CR tablet Commonly known as: TYLENOL  Stopped by: Mosiah Bastin C Chenell Lozon   bisacodyl  10 MG suppository Commonly known as: Dulcolax Stopped by: Louvenia Golomb C Bryn Perkin   fluticasone  50 MCG/ACT nasal spray Commonly known as: FLONASE  Stopped by: Adrionna Delcid C Vernon Ariel   zolpidem  5 MG tablet Commonly known as: AMBIEN  Stopped by: Juvenal Umar C Bradlee Heitman       TAKE these medications    albuterol  108 (90 Base) MCG/ACT inhaler Commonly known as: VENTOLIN  HFA Inhale 2 puffs into the lungs every 6 (six) hours as needed for wheezing or shortness of breath.   Amantadine HCl 100 MG tablet Take by mouth.   apixaban  5 MG Tabs tablet Commonly known as: Eliquis  Place 1 tablet (5 mg total) into feeding tube 2 (two) times daily.   baclofen  10 MG tablet Commonly known as: LIORESAL  Take 0.5 tablets (5 mg total) by mouth 3 (three) times daily.   brimonidine -timolol  0.2-0.5 % ophthalmic solution Commonly known as: Combigan  Place 1 drop into both eyes every 12 (twelve) hours.    clorazepate  7.5 MG tablet Commonly known as: TRANXENE  TAKE 1 TABLET(7.5 MG) BY MOUTH TWICE DAILY AS NEEDED FOR ANXIETY   Debrox 6.5 % OTIC solution Generic drug: carbamide peroxide Place 5 drops into the left ear 2 (two) times daily for 4 days. Started by: Shade Kaley C Inman Fettig   desvenlafaxine  100 MG 24 hr tablet Commonly known as: PRISTIQ  TAKE 1 TABLET(100 MG) BY MOUTH AT BEDTIME   diltiazem  60 MG tablet Commonly known as: CARDIZEM  Take 1 tablet (60 mg total) by mouth every 8 (eight) hours as needed. TAKE 1 TABLET BY FEEDING TUBE EVERY 8 HOURS AS NEEDED   feeding supplement (OSMOLITE 1.5 CAL) Liqd 60 mLs by Per J Tube route continuous.   gabapentin  100 MG capsule Commonly known as: NEURONTIN  Take 1 capsule (100 mg total) by mouth 3 (three) times daily.   hydrALAZINE  100 MG tablet Commonly known as: APRESOLINE  Place 1 tablet (100 mg total) into feeding tube 3 (three) times daily.   ipratropium-albuterol  0.5-2.5 (3) MG/3ML Soln Commonly known as: DUONEB Inhale into the lungs.   isosorbide  dinitrate 10 MG tablet Commonly known as: ISORDIL  PLACE 1 TABLET INTO FEEDING TUBE THREE TIMES DAILY   latanoprost  0.005 % ophthalmic solution Commonly known as: XALATAN  Place 1 drop into both eyes at bedtime.   multivitamin with minerals tablet Place 1 tablet into feeding tube daily.   nitrofurantoin  (macrocrystal-monohydrate) 100 MG capsule Commonly known as: MACROBID  Take 1 capsule (100 mg total) by mouth at bedtime. Suppression dosing to prevent a bladder infection   omeprazole  40 MG capsule Commonly known as: PRILOSEC Take 1 capsule (40 mg total) by mouth daily.   valbenazine  60 MG capsule Commonly known as: Ingrezza  Take 1 capsule (60 mg total) by mouth daily. What changed:  how much to take how to take this when to take this additional instructions   VITAMIN D -3 PO Take 1 tablet by mouth every morning. What changed: Another medication with the same name was removed.  Continue taking this medication, and follow the directions you see here. Changed by: Laurel Smeltz C Maryna Yeagle   zaleplon 10 MG capsule Commonly known as: SONATA Take 20 mg by mouth at bedtime.  Review of Systems  Constitutional:  Negative for appetite change, chills, fatigue, fever and unexpected weight change.  HENT:  Positive for trouble swallowing. Negative for congestion, dental problem, ear discharge, ear pain, facial swelling, hearing loss, nosebleeds, postnasal drip, rhinorrhea, sinus pressure, sinus pain, sneezing, sore throat and tinnitus.   Eyes:  Negative for pain, discharge, redness, itching and visual disturbance.  Respiratory:  Negative for cough, chest tightness, shortness of breath and wheezing.   Cardiovascular:  Negative for chest pain, palpitations and leg swelling.  Gastrointestinal:  Negative for abdominal distention, abdominal pain, blood in stool, constipation, diarrhea, nausea and vomiting.       Peg tube in place   Endocrine: Negative for cold intolerance, heat intolerance, polydipsia, polyphagia and polyuria.  Genitourinary:  Negative for difficulty urinating, dysuria, flank pain, frequency and urgency.  Musculoskeletal:  Positive for arthralgias and gait problem. Negative for back pain, joint swelling, myalgias, neck pain and neck stiffness.  Skin:  Negative for color change, pallor, rash and wound.  Neurological:  Negative for dizziness, syncope, speech difficulty, weakness, light-headedness, numbness and headaches.       T.Dyskinesia   Hematological:  Does not bruise/bleed easily.  Psychiatric/Behavioral:  Negative for agitation, behavioral problems, confusion, hallucinations, self-injury, sleep disturbance and suicidal ideas. The patient is not nervous/anxious.     Immunization History  Administered Date(s) Administered   Fluad Quad(high Dose 65+) 03/11/2019, 05/27/2020, 04/11/2021   Fluad Trivalent(High Dose 65+) 08/29/2023   Influenza Split 03/29/2013    Influenza Whole 05/09/1999, 05/19/2007, 04/01/2008, 04/21/2009, 03/26/2011   Influenza, High Dose Seasonal PF 03/27/2018, 05/25/2019   Influenza,inj,Quad PF,6+ Mos 03/25/2014, 03/27/2016, 06/13/2017   Influenza-Unspecified 04/15/2012, 04/14/2015   Moderna Sars-Covid-2 Vaccination 08/28/2019, 09/25/2019, 11/30/2019, 05/27/2020, 11/25/2020   Pneumococcal Conjugate-13 03/18/2020   Pneumococcal Polysaccharide-23 06/05/2013, 10/10/2021   Td 07/16/1992, 10/21/2008   Tdap 03/18/2020   Zoster, Live 12/14/2012   Pertinent  Health Maintenance Due  Topic Date Due   OPHTHALMOLOGY EXAM  07/31/2022   MAMMOGRAM  11/03/2023   INFLUENZA VACCINE  02/14/2024   HEMOGLOBIN A1C  02/26/2024   Colonoscopy  11/22/2024   FOOT EXAM  12/02/2024   DEXA SCAN  Completed      03/07/2023    9:58 AM 04/01/2023   10:09 AM 08/29/2023   11:08 AM 12/03/2023    3:54 PM 12/10/2023    2:47 PM  Fall Risk  Falls in the past year? 1 0 0 0 0  Was there an injury with Fall? 0 0 0 0 0  Fall Risk Category Calculator 2 0 0 0 0  Patient at Risk for Falls Due to History of fall(s);Impaired balance/gait;Impaired mobility No Fall Risks No Fall Risks Impaired balance/gait Impaired balance/gait  Fall risk Follow up Falls evaluation completed Falls evaluation completed Falls evaluation completed Falls evaluation completed;Falls prevention discussed Falls evaluation completed   Functional Status Survey:    Vitals:   12/10/23 1454  BP: 118/76  Pulse: 70  Temp: 97.6 F (36.4 C)  TempSrc: Temporal  SpO2: 98%  Weight: 208 lb (94.3 kg)  Height: 5\' 6"  (1.676 m)   Body mass index is 33.57 kg/m. Physical Exam  GENERAL: Alert, cooperative, well developed, no acute distress HEENT: Normocephalic, normal oropharynx, moist mucous membranes, cerumen in left ear, right ear normal, nose normal NECK: Neck supple CHEST: Clear to auscultation bilaterally, no wheezes, rhonchi, or crackles CARDIOVASCULAR: Normal heart rate and rhythm, S1 and  S2 normal without murmurs ABDOMEN: Soft, non-tender, non-distended, without organomegaly, normal bowel sounds EXTREMITIES:  No cyanosis or edema, extremities normal except left foot internal rotated MUSCULOSKELETAL: Limited range of motion in right shoulder, hands normal NEUROLOGICAL: Cranial nerves grossly intact, moves all extremities without gross motor or sensory deficit except Tardive dyskinesia  Labs reviewed: Recent Labs    03/07/23 1125 03/13/23 1634  NA 139 137  K 3.9 3.4*  CL 100 101  CO2 32 25  GLUCOSE 80 94  BUN 23 20  CREATININE 0.99 1.07*  CALCIUM  9.7 9.6   Recent Labs    03/07/23 1125 03/13/23 1634  AST 31 27  ALT 35 38  ALKPHOS 105 101  BILITOT 0.5 0.7  PROT 6.9 6.8  ALBUMIN  4.0 3.8   Recent Labs    03/07/23 1125 03/13/23 1634  WBC 8.5 19.9*  NEUTROABS 5.8 16.9*  HGB 14.8 14.7  HCT 46.1* 45.4  MCV 89.1 88.2  PLT 228.0 221   Lab Results  Component Value Date   TSH 1.53 03/07/2023   Lab Results  Component Value Date   HGBA1C 5.3 08/29/2023   Lab Results  Component Value Date   CHOL 143 03/07/2023   HDL 39.40 03/07/2023   LDLCALC 43 10/10/2021   LDLDIRECT 59.0 03/07/2023   TRIG 208.0 (H) 03/07/2023   CHOLHDL 4 03/07/2023    Significant Diagnostic Results in last 30 days:  No results found.  Assessment/Plan     Dysphagia Difficulty swallowing managed with speech therapy exercises. Failed swallowing test with risk of aspiration. Continues to use a feeding tube for nutrition.  Type 2 diabetes mellitus No current home blood glucose monitoring. Previous diagnosis of diabetes, current status unclear. - Check glucose levels - If glucose is high, order hemoglobin A1c  Chronic kidney disease, stage 4 Stage 4 chronic kidney disease. Recent urine albumin  creatinine test on Dec 03, 2023.  Hypertension Hypertension managed with hydrolysis and lantanapril.  Atrial fibrillation Atrial fibrillation managed with apixaban  and  diltiazem .  Depression Depression with regular psychiatric follow-up every six months.  Anxiety disorder Anxiety managed with regular psychiatric follow-up.  Sarcoidosis Continue to monitor   Tardive dyskinesia Tardive dyskinesia follow up with Neurologist as scheduled   Vitamin D  deficiency Vitamin D  deficiency managed with vitamin D3 2000 IU over the counter.  Allergic rhinitis Seasonal allergies managed with over-the-counter Zyrtec  and Pataday  eye drops.  General Health Maintenance Up to date with mammogram as of 2020. Due for shingles vaccine and eye examination. COVID vaccine discussed. - Schedule shingles vaccine - Schedule eye examination - Consider COVID vaccine   Family/ staff Communication: Reviewed plan of care with patient and care giver verbalized understanding   Labs/tests ordered:  - CBC with Differential/Platelet - CMP with eGFR([facility]) - TSH - Hgb A1C - Lipid panel - Vitamin B 12 - vitamin D  level    Next Appointment : Return in about 4 months (around 04/11/2024) for medical mangement of chronic issues.,  ear lavage in 1 week .   Spent 45 minutes of Face to face and non-face to face with patient  >50% time spent counseling; reviewing medical record; tests; labs; documentation and developing future plan of care.   Estil Heman, NP

## 2023-12-12 ENCOUNTER — Ambulatory Visit
Admission: RE | Admit: 2023-12-12 | Discharge: 2023-12-12 | Discharge status: Home / Self Care | Source: Ambulatory Visit | Attending: Internal Medicine | Admitting: Internal Medicine

## 2023-12-12 DIAGNOSIS — Z1231 Encounter for screening mammogram for malignant neoplasm of breast: Secondary | ICD-10-CM

## 2023-12-16 ENCOUNTER — Telehealth: Payer: Self-pay | Admitting: Family

## 2023-12-16 NOTE — Telephone Encounter (Signed)
 Copied from CRM [id number]. Topic: Appointments - Transfer of Care >> Nov 26, 2023  1:42 PM Tisa Forester wrote: Pt is requesting to transfer FROM: [facility] was seen Rosalia Colonel  Pt is requesting to transfer TO: [facility]  Reason for requested transfer: patient on feeding tube and need geriatric care  It is the responsibility of the team the patient would like to transfer to (Dinah C Ngetich ) to reach out to the patient if for any reason this transfer is not acceptable. patient contact number if have question reachout Melvern Stack [phone] (Geriatrics care manager) want to ask if any of the provider that handle patient that is on a feeding tube

## 2023-12-16 NOTE — Telephone Encounter (Signed)
 Yes, this would fine with me.  thanks

## 2023-12-18 ENCOUNTER — Ambulatory Visit: Admitting: Family

## 2023-12-18 ENCOUNTER — Encounter: Payer: Self-pay | Admitting: Family

## 2023-12-18 VITALS — BP 114/68 | HR 54 | Ht 66.0 in | Wt 208.0 lb

## 2023-12-18 DIAGNOSIS — H6122 Impacted cerumen, left ear: Secondary | ICD-10-CM | POA: Diagnosis not present

## 2023-12-18 NOTE — Progress Notes (Signed)
 Provider: Christean Courts FNP-C  Paiton Fosco, Elijio Guadeloupe, NP  Patient Care Team: David Towson, Elijio Guadeloupe, NP as PCP - General (Family Medicine) Bridgette Campus, MD (Inactive) as PCP - Cardiology (Cardiology) Rudine Cos, MD as Consulting Physician (Ophthalmology)  Extended Emergency Contact Information Primary Emergency Contact: Tramel,Kiara  [place] Mobile Phone: [phone] Relation: Other Secondary Emergency Contact: Weiss,Patricia Address: [address] [place] [address] [place] Home Phone: [phone] Mobile Phone: [phone] Relation: Sister  Code Status:  Full Code  Goals of care: Advanced Directive information    12/10/2023    2:46 PM  Advanced Directives  Does Patient Have a Medical Advance Directive? Yes  Type of Estate agent of [place];Living will  Does patient want to make changes to medical advance directive? No - Patient declined  Copy of Healthcare Power of Attorney in Chart? Yes - validated most recent copy scanned in chart (See row information)     Chief Complaint  Patient presents with   Ear Lavage     Discussed the use of AI scribe software for clinical note transcription with the patient, who gave verbal consent to proceed.  History of Present Illness   Carrie Usery is a 73 year old female who presents with follow-up regarding ear wax removal.  She used ear drops for four days, which successfully cleared her ear wax. A significant amount of wax was removed using a suction kit used by her care giver at home.  No ear pain, tinnitus, or hearing issues are present. No fever or chills.  Past Medical History:  Diagnosis Date   Anemia    on meds   ANKLE PAIN, LEFT 04/01/2008   ANXIETY 04/17/2007   on meds   Arthritis    generalized   Cataract    bilateral sx   Chronic kidney disease    stage 3   Colon polyp    COLONIC POLYPS, HX OF 08/01/2007   CONTUSIONS, MULTIPLE 04/01/2009    DEPRESSION 04/17/2007   on meds   DIZZINESS 08/01/2007   DYSPNEA 08/01/2007   with exertion   Enlargement of lymph nodes 08/13/2007   Excessive involuntary blinking    per pt,going on since 2018   Glaucoma    on meds   GLUCOSE INTOLERANCE 08/01/2007   Hypercalcemia due to sarcoidosis 2014   HYPERLIPIDEMIA 08/01/2007   no meds   HYPERSOMNIA 07/28/2008   HYPERTENSION 04/17/2007   Impaired glucose tolerance 03/23/2011   JOINT EFFUSION, LEFT KNEE 06/02/2010   Loose body in knee 04/01/2009   pt not sure?   Metabolic encephalopathy 12/09/2015-12/2015   Migraines    "stopped 3-4 yr ago" (07/23/2012)   Morbid obesity (HCC) 04/20/2007   OTHER DISEASES OF LUNG NOT ELSEWHERE CLASSIFIED 08/01/2007   Pain in joint, lower leg 04/01/2009   PERIPHERAL EDEMA 04/21/2009   Pre-diabetes    Sarcoidosis 09/25/2007   Seasonal allergies    SHOULDER PAIN, LEFT 04/01/2009   Sleep apnea    does not use Cpap   Past Surgical History:  Procedure Laterality Date   BREAST SURGERY     Biopsy benign/bil breaST   CARDIOVERSION N/A 12/15/2021   Procedure: CARDIOVERSION;  Surgeon: Maudine Sos, MD;  Location: [facility] ENDOSCOPY;  Service: Cardiovascular;  Laterality: N/A;   CARDIOVERSION N/A 02/09/2022   Procedure: CARDIOVERSION;  Surgeon: Hazle Lites, MD;  Location: [facility] ENDOSCOPY;  Service: Cardiovascular;  Laterality: N/A;   CATARACT EXTRACTION  RIGHT EYE   COLONOSCOPY  2020   KN-MAC-suprep(good)-tics/TA's   COMBINED MEDIASTINOSCOPY AND BRONCHOSCOPY  08/2007   COMBINED MEDIASTINOSCOPY AND BRONCHOSCOPY  2009   Dental implant     FRACTURE SURGERY  ?02/1997   "left upper arm; put rod in" (07/23/2012)   GUM SURGERY  2000-?2009   "several ORs; soft tissue graft; took material from roof of mouth" (07/23/2012   HUMERUS SURGERY Left 1998   rod insertion   IR GASTROSTOMY TUBE MOD SED  06/22/2022   IR REPLC GASTRO/COLONIC TUBE PERCUT W/FLUORO  10/25/2022   KNEE ARTHROSCOPY  03/2004; 06/2009   "right; left"  Dr. Hazeline Lister   KNEE SURGERY  2012   ARTHROSCOPIC LEFT KNEE   LYMPH NODE BIOPSY  ~ 2009   "for sarcoidosis; don't know exactly which nodes" (07/23/2102)   MYOMECTOMY  1994   Open   POLYPECTOMY  2020   TA's   REFRACTIVE SURGERY  08/1998   "both eyes" (07/23/2012)   REFRACTIVE SURGERY  2000   Bil   TOTAL KNEE ARTHROPLASTY Left 09/24/2016   Procedure: TOTAL KNEE ARTHROPLASTY;  Surgeon: Neil Balls, MD;  Location: [facility] OR;  Service: Orthopedics;  Laterality: Left;    Allergies  Allergen Reactions   Sulfa Antibiotics Rash   Fluoxetine Hcl Other (See Comments)    (PROZAC) Suicidal thoughts   Cocoa    Eicosapentaenoic Acid    Omega-3 Fatty Acids (Plant)     Outpatient Encounter Medications as of 12/18/2023  Medication Sig   albuterol  (VENTOLIN  HFA) 108 (90 Base) MCG/ACT inhaler Inhale 2 puffs into the lungs every 6 (six) hours as needed for wheezing or shortness of breath.   Amantadine HCl 100 MG tablet Take by mouth.   apixaban  (ELIQUIS ) 5 MG TABS tablet Place 1 tablet (5 mg total) into feeding tube 2 (two) times daily.   baclofen  (LIORESAL ) 10 MG tablet Take 0.5 tablets (5 mg total) by mouth 3 (three) times daily.   brimonidine -timolol  (COMBIGAN ) 0.2-0.5 % ophthalmic solution Place 1 drop into both eyes every 12 (twelve) hours.   Cholecalciferol  (VITAMIN D -3 PO) Take 1 tablet by mouth every morning.   clorazepate  (TRANXENE ) 7.5 MG tablet TAKE 1 TABLET(7.5 MG) BY MOUTH TWICE DAILY AS NEEDED FOR ANXIETY   desvenlafaxine  (PRISTIQ ) 100 MG 24 hr tablet TAKE 1 TABLET(100 MG) BY MOUTH AT BEDTIME   diltiazem  (CARDIZEM ) 60 MG tablet Take 1 tablet (60 mg total) by mouth every 8 (eight) hours as needed. TAKE 1 TABLET BY FEEDING TUBE EVERY 8 HOURS AS NEEDED   gabapentin  (NEURONTIN ) 100 MG capsule Take 1 capsule (100 mg total) by mouth 3 (three) times daily.   hydrALAZINE  (APRESOLINE ) 100 MG tablet Place 1 tablet (100 mg total) into feeding tube 3 (three) times daily.   ipratropium-albuterol  (DUONEB)  0.5-2.5 (3) MG/3ML SOLN Inhale into the lungs.   isosorbide  dinitrate (ISORDIL ) 10 MG tablet PLACE 1 TABLET INTO FEEDING TUBE THREE TIMES DAILY   latanoprost  (XALATAN ) 0.005 % ophthalmic solution Place 1 drop into both eyes at bedtime.   Multiple Vitamins-Minerals (MULTIVITAMIN WITH MINERALS) tablet Place 1 tablet into feeding tube daily.   nitrofurantoin , macrocrystal-monohydrate, (MACROBID ) 100 MG capsule Take 1 capsule (100 mg total) by mouth at bedtime. Suppression dosing to prevent a bladder infection   Nutritional Supplements (FEEDING SUPPLEMENT, OSMOLITE 1.5 CAL,) LIQD 60 mLs by Per J Tube route continuous.   omeprazole  (PRILOSEC) 40 MG capsule Take 1 capsule (40 mg total) by mouth daily.   valbenazine  (INGREZZA ) 60 MG capsule Take  1 capsule (60 mg total) by mouth daily. (Patient taking differently: Patient takes 40 mg by mouth daily.)   zaleplon (SONATA) 10 MG capsule Take 20 mg by mouth at bedtime.   No facility-administered encounter medications on file as of 12/18/2023.    Review of Systems  Constitutional:  Negative for appetite change, chills, fatigue, fever and unexpected weight change.  HENT:  Negative for congestion, ear discharge, ear pain, hearing loss, nosebleeds, postnasal drip, rhinorrhea, sinus pressure, sinus pain, sneezing, sore throat and tinnitus.   Eyes:  Negative for pain, discharge, redness and itching.  Respiratory:  Negative for cough, chest tightness, shortness of breath and wheezing.   Cardiovascular:  Negative for chest pain, palpitations and leg swelling.  Skin:  Negative for color change, pallor, rash and wound.  Neurological:  Negative for dizziness, weakness, light-headedness and headaches.    Immunization History  Administered Date(s) Administered   Fluad Quad(high Dose 65+) 03/11/2019, 05/27/2020, 04/11/2021   Fluad Trivalent(High Dose 65+) 08/29/2023   Influenza Split 03/29/2013   Influenza Whole 05/09/1999, 05/19/2007, 04/01/2008, 04/21/2009,  03/26/2011   Influenza, High Dose Seasonal PF 03/27/2018, 05/25/2019   Influenza,inj,Quad PF,6+ Mos 03/25/2014, 03/27/2016, 06/13/2017   Influenza-Unspecified 04/15/2012, 04/14/2015   Moderna Sars-Covid-2 Vaccination 08/28/2019, 09/25/2019, 11/30/2019, 05/27/2020, 11/25/2020   Pneumococcal Conjugate-13 03/18/2020   Pneumococcal Polysaccharide-23 06/05/2013, 10/10/2021   Td 07/16/1992, 10/21/2008   Tdap 03/18/2020   Zoster, Live 12/14/2012   Pertinent  Health Maintenance Due  Topic Date Due   OPHTHALMOLOGY EXAM  07/31/2022   INFLUENZA VACCINE  02/14/2024   HEMOGLOBIN A1C  02/26/2024   Colonoscopy  11/22/2024   FOOT EXAM  12/02/2024   MAMMOGRAM  12/11/2025   DEXA SCAN  Completed      03/07/2023    9:58 AM 04/01/2023   10:09 AM 08/29/2023   11:08 AM 12/03/2023    3:54 PM 12/10/2023    2:47 PM  Fall Risk  Falls in the past year? 1 0 0 0 0  Was there an injury with Fall? 0 0 0 0 0  Fall Risk Category Calculator 2 0 0 0 0  Patient at Risk for Falls Due to History of fall(s);Impaired balance/gait;Impaired mobility No Fall Risks No Fall Risks Impaired balance/gait Impaired balance/gait  Fall risk Follow up Falls evaluation completed Falls evaluation completed Falls evaluation completed Falls evaluation completed;Falls prevention discussed Falls evaluation completed   Functional Status Survey:    Vitals:   12/18/23 1428  BP: 114/68  Pulse: (!) 54  SpO2: 99%  Weight: 208 lb (94.3 kg)  Height: 5\' 6"  (1.676 m)   Body mass index is 33.57 kg/m. Physical Exam GENERAL: Alert, cooperative, well developed, no acute distress. HEENT: Normocephalic, normal oropharynx, moist mucous membranes, cerumen cleared bilaterally, tympanic membranes normal. CHEST: Clear to auscultation bilaterally, no wheezes, rhonchi, or crackles. CARDIOVASCULAR: Regular rate and rhythm, S1 and S2 normal without murmurs. ABDOMEN: Soft, non-tender, non-distended, without organomegaly, normal bowel  sounds. EXTREMITIES: No cyanosis or edema. NEUROLOGICAL: Cranial nerves grossly intact, moves all extremities without gross motor or sensory deficit.  Labs reviewed: Recent Labs    03/07/23 1125 03/13/23 1634 12/10/23 1541  NA 139 137 141  K 3.9 3.4* 4.0  CL 100 101 104  CO2 32 25 27  GLUCOSE 80 94 71  BUN 23 20 16   CREATININE 0.99 1.07* 1.07*  CALCIUM  9.7 9.6 9.7   Recent Labs    03/07/23 1125 03/13/23 1634 12/10/23 1541  AST 31 27 19   ALT 35 38 18  ALKPHOS 105 101  --   BILITOT 0.5 0.7 0.7  PROT 6.9 6.8 6.5  ALBUMIN  4.0 3.8  --    Recent Labs    03/07/23 1125 03/13/23 1634 12/10/23 1541  WBC 8.5 19.9* 6.9  NEUTROABS 5.8 16.9* 4,451  HGB 14.8 14.7 15.3  HCT 46.1* 45.4 46.6*  MCV 89.1 88.2 87.6  PLT 228.0 221 207   Lab Results  Component Value Date   TSH 1.46 12/10/2023   Lab Results  Component Value Date   HGBA1C 5.3 08/29/2023   Lab Results  Component Value Date   CHOL 157 12/10/2023   HDL 45 (L) 12/10/2023   LDLCALC 83 12/10/2023   LDLDIRECT 59.0 03/07/2023   TRIG 194 (H) 12/10/2023   CHOLHDL 3.5 12/10/2023    Significant Diagnostic Results in last 30 days:  MM 3D SCREENING MAMMOGRAM BILATERAL BREAST Result Date: 12/17/2023 CLINICAL DATA:  Screening. EXAM: DIGITAL SCREENING BILATERAL MAMMOGRAM WITH TOMOSYNTHESIS AND CAD TECHNIQUE: Bilateral screening digital craniocaudal and mediolateral oblique mammograms were obtained. Bilateral screening digital breast tomosynthesis was performed. The images were evaluated with computer-aided detection. Best images possible per technologist communication. COMPARISON:  Previous exam(s). ACR Breast Density Category b: There are scattered areas of fibroglandular density. FINDINGS: There are no findings suspicious for malignancy. IMPRESSION: No mammographic evidence of malignancy. A result letter of this screening mammogram will be mailed directly to the patient. RECOMMENDATION: Screening mammogram in one year.  (Code:SM-B-01Y) BI-RADS CATEGORY  1: Negative. Electronically Signed   By: Clancy Crimes M.D.   On: 12/17/2023 13:37    Assessment/Plan Cerumen impaction Cerumen impaction was successfully treated with ear drops and suctioning at home by care giver. The ears are now clear, and the eardrum appears healthy. No symptoms such as otalgia, tinnitus, or hearing loss are reported. - Discontinue ear drops - Monitor for recurrence of cerumen buildup - Advise to report any otalgia or hearing issues  Follow-up Routine follow-up appointment scheduled. - Follow-up appointment on September 17th at 2:20 PM   Family/ staff Communication: Reviewed plan of care with patient verbalized understanding   Labs/tests ordered: None   Next Appointment: Return if symptoms worsen or fail to improve.   Total time: 15 minutes. Greater than 50% of total time spent doing patient education regarding cerumen impaction left ear and health maintenance including symptom/medication management.   Estil Heman, NP

## 2023-12-19 ENCOUNTER — Ambulatory Visit: Payer: Self-pay | Admitting: Family

## 2023-12-25 ENCOUNTER — Other Ambulatory Visit: Payer: Self-pay | Admitting: Internal Medicine

## 2023-12-30 ENCOUNTER — Telehealth: Payer: Self-pay | Admitting: Obstetrics and Gynecology

## 2023-12-30 DIAGNOSIS — E2839 Other primary ovarian failure: Secondary | ICD-10-CM

## 2023-12-30 NOTE — Telephone Encounter (Signed)
 Referral for dxa scan

## 2024-01-08 ENCOUNTER — Telehealth: Payer: Self-pay

## 2024-01-08 NOTE — Telephone Encounter (Signed)
 Message routed to front admin staff to contact patient point of contact and have appointment rescheduled with PCP Ngetich, Dinah C, NP per Dr.Veludandi

## 2024-01-08 NOTE — Telephone Encounter (Signed)
 A lady walked into the office and spoke to the front admin about this patient. She is one of patient point of contacts. She came with FL2 form that has already been filled out by another physician that's not from this office. She requested that we copy and pasted what the previous provider from another office had on FL2 form and sign it for patient. I notified her that we're unable to do that because patient was last seen in office 12/18/2023 for impacted cerumen of left ear. During this visit neither patient nor point of contact address needing an updated FL2 form. Also a provider here in our office needs to evaluate patient themselves before signing any forms. She agreed to come back to office and to bring patient to be seen. PCP Ngetich, Roxan BROCKS, NP is out of office. Patient scheduled 01/14/2024 with Dr.Veludandi. I have placed a copy of FL2 form in review and sign folder for reference. Message routed to Dr.Veludandi as FYI.

## 2024-01-08 NOTE — Telephone Encounter (Signed)
 Sherlynn Madden, MD    Plz schedule the appt with her PCP, I would not be able to fill the FL2 form, thanks.

## 2024-01-13 ENCOUNTER — Other Ambulatory Visit (HOSPITAL_COMMUNITY): Payer: Self-pay

## 2024-01-14 ENCOUNTER — Encounter: Payer: Self-pay | Admitting: Sports Medicine

## 2024-01-14 ENCOUNTER — Ambulatory Visit (INDEPENDENT_AMBULATORY_CARE_PROVIDER_SITE_OTHER): Admitting: Sports Medicine

## 2024-01-14 VITALS — BP 122/84 | HR 63 | Temp 97.9°F | Resp 11 | Ht 66.0 in

## 2024-01-14 DIAGNOSIS — G2401 Drug induced subacute dyskinesia: Secondary | ICD-10-CM

## 2024-01-14 DIAGNOSIS — R1310 Dysphagia, unspecified: Secondary | ICD-10-CM | POA: Diagnosis not present

## 2024-01-14 DIAGNOSIS — Z789 Other specified health status: Secondary | ICD-10-CM

## 2024-01-14 NOTE — Progress Notes (Signed)
 Careteam: Patient Care Team: Ngetich, Roxan BROCKS, NP as PCP - General (Family Medicine) Alvan Ronal BRAVO, MD (Inactive) as PCP - Cardiology (Cardiology) Patrcia Sharper, MD as Consulting Physician (Ophthalmology)  PLACE OF SERVICE:  [facility]  Advanced Directive information    Allergies  Allergen Reactions   Sulfa Antibiotics Rash   Fluoxetine Hcl Other (See Comments)    (PROZAC) Suicidal thoughts   Cocoa    Eicosapentaenoic Acid    Omega-3 Fatty Acids (Plant)     Chief Complaint  Patient presents with   Form Completion    FL2 form  Also wants to know if she can have a referral to neuro      Discussed the use of AI scribe software for clinical note transcription with the patient, who gave verbal consent to proceed.  History of Present Illness  Tara [name] is a 73 year old female  presented to clinic with her  primary caregiver, who lives with her and provides 24/7 care for fl2 forms .  She has TD  and follows up with a neurologist. She uses a walker for mobility and requires assistance with daily activities such as using the restroom, showering, and dressing. She experiences urinary incontinence, sometimes leaking urine when coughing or sneezing, and wears adult briefs. She has a history of bowel movements on a schedule and uses a commode with assistance.  She has a feeding tube due to swallowing difficulties, which has been in place for about a year and nine months. She was discharged from speech therapy but can eat soft pleasure foods. She sometimes experiences coughing or choking while eating or drinking. Her feeding tube intake has been adjusted due to weight gain, and she is awaiting further guidance on nutritional needs.   Her medication regimen includes albuterol  as needed, amantadine 100 mg, Eliquis  5 mg twice daily, baclofen  5 mg three times daily, vitamin D3, chlorazepate, Pristiq  (venlafaxine ), Cardizem  (diltiazem ), gabapentin , hydralazine  100 mg three times daily,  isosorbide  10 mg daily, Macrobid  once daily for UTI prevention, omeprazole  40 mg daily, Ingrezza  40 mg daily, and Zaleplon 20 mg for sleep. She is prone to UTIs and takes Macrobid  as a preventive measure which is prescribed by her gyn.  She has a history of anxiety and uses Zaleplon for sleep. She does not have a history of blood clots but uses compression socks and boots to manage fluid retention due to prolonged sitting. She has a customized brace for foot alignment due to inward foot positioning, which affects her walking.  She previously stayed in a nursing home for eight months before moving back home, where she now receives private care. She has been with her current caregiver for a year.    Review of Systems:  Review of Systems  Constitutional:  Negative for chills and fever.  HENT:  Negative for sore throat.   Respiratory:  Negative for cough, sputum production and shortness of breath.   Cardiovascular:  Negative for chest pain, palpitations and leg swelling.  Gastrointestinal:  Negative for abdominal pain, heartburn and nausea.  Genitourinary:  Negative for dysuria.  Neurological:  Negative for dizziness.   Negative unless indicated in HPI.   Past Medical History:  Diagnosis Date   Anemia    on meds   ANKLE PAIN, LEFT 04/01/2008   ANXIETY 04/17/2007   on meds   Arthritis    generalized   Cataract    bilateral sx   Chronic kidney disease    stage 3   Colon polyp  COLONIC POLYPS, HX OF 08/01/2007   CONTUSIONS, MULTIPLE 04/01/2009   DEPRESSION 04/17/2007   on meds   DIZZINESS 08/01/2007   DYSPNEA 08/01/2007   with exertion   Enlargement of lymph nodes 08/13/2007   Excessive involuntary blinking    per pt,going on since 2018   Glaucoma    on meds   GLUCOSE INTOLERANCE 08/01/2007   Hypercalcemia due to sarcoidosis 2014   HYPERLIPIDEMIA 08/01/2007   no meds   HYPERSOMNIA 07/28/2008   HYPERTENSION 04/17/2007   Impaired glucose tolerance 03/23/2011   JOINT  EFFUSION, LEFT KNEE 06/02/2010   Loose body in knee 04/01/2009   pt not sure?   Metabolic encephalopathy 12/09/2015-12/2015   Migraines    stopped 3-4 yr ago (07/23/2012)   Morbid obesity (HCC) 04/20/2007   OTHER DISEASES OF LUNG NOT ELSEWHERE CLASSIFIED 08/01/2007   Pain in joint, lower leg 04/01/2009   PERIPHERAL EDEMA 04/21/2009   Pre-diabetes    Sarcoidosis 09/25/2007   Seasonal allergies    SHOULDER PAIN, LEFT 04/01/2009   Sleep apnea    does not use Cpap   Past Surgical History:  Procedure Laterality Date   BREAST SURGERY     Biopsy benign/bil breaST   CARDIOVERSION N/A 12/15/2021   Procedure: CARDIOVERSION;  Surgeon: Raford Riggs, MD;  Location: [facility] ENDOSCOPY;  Service: Cardiovascular;  Laterality: N/A;   CARDIOVERSION N/A 02/09/2022   Procedure: CARDIOVERSION;  Surgeon: Mona Vinie BROCKS, MD;  Location: [facility] ENDOSCOPY;  Service: Cardiovascular;  Laterality: N/A;   CATARACT EXTRACTION     RIGHT EYE   COLONOSCOPY  2020   KN-MAC-suprep(good)-tics/TA's   COMBINED MEDIASTINOSCOPY AND BRONCHOSCOPY  08/2007   COMBINED MEDIASTINOSCOPY AND BRONCHOSCOPY  2009   Dental implant     FRACTURE SURGERY  ?02/1997   left upper arm; put rod in (07/23/2012)   GUM SURGERY  2000-?2009   several ORs; soft tissue graft; took material from roof of mouth (07/23/2012   HUMERUS SURGERY Left 1998   rod insertion   IR GASTROSTOMY TUBE MOD SED  06/22/2022   IR REPLC GASTRO/COLONIC TUBE PERCUT W/FLUORO  10/25/2022   KNEE ARTHROSCOPY  03/2004; 06/2009   right; left Dr. Gerome   KNEE SURGERY  2012   ARTHROSCOPIC LEFT KNEE   LYMPH NODE BIOPSY  ~ 2009   for sarcoidosis; don't know exactly which nodes (07/23/2102)   MYOMECTOMY  1994   Open   POLYPECTOMY  2020   TA's   REFRACTIVE SURGERY  08/1998   both eyes (07/23/2012)   REFRACTIVE SURGERY  2000   Bil   TOTAL KNEE ARTHROPLASTY Left 09/24/2016   Procedure: TOTAL KNEE ARTHROPLASTY;  Surgeon: Yvone Rush, MD;  Location: [facility] OR;  Service:  Orthopedics;  Laterality: Left;   Social History:   reports that she has never smoked. She has never used smokeless tobacco. She reports that she does not currently use alcohol . She reports that she does not use drugs.  Family History  Problem Relation Age of Onset   Diabetes Mother    Hypertension Mother    Stroke Mother    Heart disease Father 69   Hypertension Father    Hypertension Sister    Asthma Sister    Gout Sister    Colon cancer Neg Hx    Breast cancer Neg Hx    Esophageal cancer Neg Hx    Stomach cancer Neg Hx    Rectal cancer Neg Hx    Colon polyps Neg Hx     Medications: Patient's Medications  New Prescriptions   No medications on file  Previous Medications   ALBUTEROL  (VENTOLIN  HFA) 108 (90 BASE) MCG/ACT INHALER    Inhale 2 puffs into the lungs every 6 (six) hours as needed for wheezing or shortness of breath.   AMANTADINE HCL 100 MG TABLET    Take by mouth.   APIXABAN  (ELIQUIS ) 5 MG TABS TABLET    Place 1 tablet (5 mg total) into feeding tube 2 (two) times daily.   BACLOFEN  (LIORESAL ) 10 MG TABLET    Take 0.5 tablets (5 mg total) by mouth 3 (three) times daily.   BRIMONIDINE -TIMOLOL  (COMBIGAN ) 0.2-0.5 % OPHTHALMIC SOLUTION    Place 1 drop into both eyes every 12 (twelve) hours.   CHOLECALCIFEROL  (VITAMIN D -3 PO)    Take 1 tablet by mouth every morning.   CLORAZEPATE  (TRANXENE ) 7.5 MG TABLET    TAKE 1 TABLET(7.5 MG) BY MOUTH TWICE DAILY AS NEEDED FOR ANXIETY   DESVENLAFAXINE  (PRISTIQ ) 100 MG 24 HR TABLET    TAKE 1 TABLET(100 MG) BY MOUTH AT BEDTIME   DILTIAZEM  (CARDIZEM ) 60 MG TABLET    Take 1 tablet (60 mg total) by mouth every 8 (eight) hours as needed. TAKE 1 TABLET BY FEEDING TUBE EVERY 8 HOURS AS NEEDED   GABAPENTIN  (NEURONTIN ) 100 MG CAPSULE    Take 1 capsule (100 mg total) by mouth 3 (three) times daily.   HYDRALAZINE  (APRESOLINE ) 100 MG TABLET    Place 1 tablet (100 mg total) into feeding tube 3 (three) times daily.   IPRATROPIUM-ALBUTEROL  (DUONEB)  0.5-2.5 (3) MG/3ML SOLN    Inhale into the lungs.   ISOSORBIDE  DINITRATE (ISORDIL ) 10 MG TABLET    PLACE 1 TABLET INTO FEEDING TUBE THREE TIMES DAILY   LATANOPROST  (XALATAN ) 0.005 % OPHTHALMIC SOLUTION    Place 1 drop into both eyes at bedtime.   MULTIPLE VITAMINS-MINERALS (MULTIVITAMIN WITH MINERALS) TABLET    Place 1 tablet into feeding tube daily.   NITROFURANTOIN , MACROCRYSTAL-MONOHYDRATE, (MACROBID ) 100 MG CAPSULE    Take 1 capsule (100 mg total) by mouth at bedtime. Suppression dosing to prevent a bladder infection   NUTRITIONAL SUPPLEMENTS (FEEDING SUPPLEMENT, OSMOLITE 1.5 CAL,) LIQD    60 mLs by Per J Tube route continuous.   OMEPRAZOLE  (PRILOSEC) 40 MG CAPSULE    Take 1 capsule (40 mg total) by mouth daily.   VALBENAZINE  (INGREZZA ) 40 MG CAPSULE    Take 40 mg by mouth daily.   VALBENAZINE  (INGREZZA ) 60 MG CAPSULE    Take 1 capsule (60 mg total) by mouth daily.   ZALEPLON (SONATA) 10 MG CAPSULE    Take 20 mg by mouth at bedtime.  Modified Medications   No medications on file  Discontinued Medications   No medications on file    Physical Exam: Vitals:   01/13/24 1616  BP: 122/84  Pulse: 63  Resp: 11  Temp: 97.9 F (36.6 C)  SpO2: 99%  Height: 5' 6 (1.676 m)   Body mass index is 33.57 kg/m. BP Readings from Last 3 Encounters:  01/13/24 122/84  12/18/23 114/68  12/10/23 118/76   Wt Readings from Last 3 Encounters:  12/18/23 208 lb (94.3 kg)  12/10/23 208 lb (94.3 kg)  12/03/23 215 lb (97.5 kg)    Physical Exam Constitutional:      Appearance: Normal appearance.  HENT:     Head: Normocephalic and atraumatic.   Cardiovascular:     Rate and Rhythm: Normal rate and regular rhythm.  Pulmonary:     Effort:  Pulmonary effort is normal. No respiratory distress.     Breath sounds: Normal breath sounds. No wheezing.  Abdominal:     General: Bowel sounds are normal. There is no distension.     Tenderness: There is no abdominal tenderness. There is no guarding or  rebound.     Comments:  No redness around  gastrostomy site    Musculoskeletal:     Comments: Wearing compression stockings and brace on left leg    Neurological:     Mental Status: She is alert. Mental status is at baseline.     Motor: No weakness.     Labs reviewed: Basic Metabolic Panel: Recent Labs    03/07/23 1125 03/13/23 1634 12/10/23 1541  NA 139 137 141  K 3.9 3.4* 4.0  CL 100 101 104  CO2 32 25 27  GLUCOSE 80 94 71  BUN 23 20 16   CREATININE 0.99 1.07* 1.07*  CALCIUM  9.7 9.6 9.7  TSH 1.53  --  1.46   Liver Function Tests: Recent Labs    03/07/23 1125 03/13/23 1634 12/10/23 1541  AST 31 27 19   ALT 35 38 18  ALKPHOS 105 101  --   BILITOT 0.5 0.7 0.7  PROT 6.9 6.8 6.5  ALBUMIN  4.0 3.8  --    No results for input(s): LIPASE, AMYLASE in the last 8760 hours. No results for input(s): AMMONIA in the last 8760 hours. CBC: Recent Labs    03/07/23 1125 03/13/23 1634 12/10/23 1541  WBC 8.5 19.9* 6.9  NEUTROABS 5.8 16.9* 4,451  HGB 14.8 14.7 15.3  HCT 46.1* 45.4 46.6*  MCV 89.1 88.2 87.6  PLT 228.0 221 207   Lipid Panel: Recent Labs    03/07/23 1125 12/10/23 1541  CHOL 143 157  HDL 39.40 45*  LDLCALC  --  83  TRIG 208.0* 194*  CHOLHDL 4 3.5  LDLDIRECT 59.0  --    TSH: Recent Labs    03/07/23 1125 12/10/23 1541  TSH 1.53 1.46   A1C: Lab Results  Component Value Date   HGBA1C 5.3 08/29/2023    Assessment and Plan Assessment & Plan  1. Tardive dyskinesia (Primary)  Caregiver requested neruology referral for second opinion on movement disorders  - Ambulatory referral to Neurology  2. Dysphagia, unspecified type   - Amb ref to Medical Nutrition Therapy-MNT  3. On tube feeding diet   - Amb ref to Medical Nutrition Therapy-MNT  FL2 Form filled out   Other orders - valbenazine  (INGREZZA ) 40 MG capsule; Take 40 mg by mouth daily. - UNABLE TO FIND; Miscellaneous- Compression stockings bilaterally during the day, left leg  brace applied during the day prior to getting up, and bilateral boot for heels at night     30 min  Total time spent for obtaining history,  performing a medically appropriate examination and evaluation, reviewing the tests, ,  documenting clinical information in the electronic or other health record,   ,care coordination (not separately reported)

## 2024-01-21 NOTE — Telephone Encounter (Signed)
 Patient was seen in office by Dr.Prashanti Veludandi 01/14/2024.

## 2024-01-30 ENCOUNTER — Ambulatory Visit (HOSPITAL_BASED_OUTPATIENT_CLINIC_OR_DEPARTMENT_OTHER)
Admission: RE | Admit: 2024-01-30 | Discharge: 2024-01-30 | Disposition: A | Discharge status: Home / Self Care | Source: Ambulatory Visit | Attending: Obstetrics and Gynecology | Admitting: Obstetrics and Gynecology

## 2024-01-30 DIAGNOSIS — E2839 Other primary ovarian failure: Secondary | ICD-10-CM | POA: Insufficient documentation

## 2024-02-06 ENCOUNTER — Ambulatory Visit: Payer: Self-pay

## 2024-02-06 ENCOUNTER — Ambulatory Visit: Admitting: Family

## 2024-02-06 NOTE — Telephone Encounter (Signed)
Sending to provider as a FYI.

## 2024-02-06 NOTE — Telephone Encounter (Signed)
 Please schedule appointment if no improvement.

## 2024-02-06 NOTE — Telephone Encounter (Signed)
 FYI Only or Action Required?: Action required by provider: request for appointment, referral request, and lab or test result follow-up needed.  Patient was last seen in primary care on 01/14/2024 by Sherlynn Madden, MD.  Called Nurse Triage reporting No chief complaint on file..  Symptoms began several weeks ago.  Interventions attempted: Rest, hydration, or home remedies.  Symptoms are: gradually worsening.  Triage Disposition: Information or Advice Only Call  Patient/caregiver understands and will follow disposition?: Unsure  Copied from CRM [id number]. Topic: Clinical - Red Word Triage >> Feb 06, 2024 10:50 AM Zane F wrote: [facility] that prompted transfer to Nurse Triage:   Productive cough; Her TD is overriding the patient to expel the phlegm; congestion sounds like it is in her chest now Reason for Disposition  Caller hangs up  Answer Assessment - Initial Assessment Questions 1. ONSET: When did the cough begin?      Several weeks ago  2. SEVERITY: How bad is the cough today?      Constant  3. SPUTUM: Describe the color of your sputum (e.g., none, dry cough; clear, white, yellow, green)     Yes, unsure of color due to swallowing  4. HEMOPTYSIS: Are you coughing up any blood? If Yes, ask: How much? (e.g., flecks, streaks, tablespoons, etc.)     No  5. DIFFICULTY BREATHING: Are you having difficulty breathing? If Yes, ask: How bad is it? (e.g., mild, moderate, severe)      Dyspnea with coughing  6. FEVER: Do you have a fever? If Yes, ask: What is your temperature, how was it measured, and when did it start?     No  7. CARDIAC HISTORY: Do you have any history of heart disease? (e.g., heart attack, congestive heart failure)      Hypertension,   8. LUNG HISTORY: Do you have any history of lung disease?  (e.g., pulmonary embolus, asthma, emphysema)     History of Respiratory Infections, OSA  9. PE RISK FACTORS: Do you have a history of blood  clots? (or: recent major surgery, recent prolonged travel, bedridden)       10. OTHER SYMPTOMS: Do you have any other symptoms? (e.g., runny nose, wheezing, chest pain)       Denies other symptoms  11. PREGNANCY: Is there any chance you are pregnant? When was your last menstrual period?       No and No  12. TRAVEL: Have you traveled out of the country in the last month? (e.g., travel history, exposures)       No  Caregiver Rueben is requesting  Answer Assessment - Initial Assessment Questions The caller disconnected after assessment, once reconnected the patient responded with, I'm sorry, we're good, and disconnected the call again.  Protocols used: Cough - Acute Productive-A-AH, Difficult Call-A-AH

## 2024-02-07 ENCOUNTER — Encounter: Payer: Self-pay | Admitting: Family

## 2024-02-07 ENCOUNTER — Ambulatory Visit: Payer: Self-pay | Admitting: Obstetrics and Gynecology

## 2024-02-07 ENCOUNTER — Ambulatory Visit
Admission: RE | Admit: 2024-02-07 | Discharge: 2024-02-07 | Disposition: A | Discharge status: Home / Self Care | Source: Ambulatory Visit | Attending: Family | Admitting: Family

## 2024-02-07 ENCOUNTER — Ambulatory Visit: Admitting: Family

## 2024-02-07 VITALS — BP 128/80 | HR 67 | Temp 97.6°F | Resp 19

## 2024-02-07 DIAGNOSIS — R1310 Dysphagia, unspecified: Secondary | ICD-10-CM | POA: Diagnosis not present

## 2024-02-07 DIAGNOSIS — R051 Acute cough: Secondary | ICD-10-CM | POA: Diagnosis not present

## 2024-02-07 NOTE — Patient Instructions (Signed)
-   Please get abdominal X-ray at [facility] then will call you with results.

## 2024-02-14 NOTE — Progress Notes (Signed)
 Provider: Roxan Plough FNP-C  Odis Wickey, Roxan BROCKS, NP  Patient Care Team: Anessa Charley, Roxan BROCKS, NP as PCP - General (Family Medicine) Alvan Ronal BRAVO, MD (Inactive) as PCP - Cardiology (Cardiology) Patrcia Sharper, MD as Consulting Physician (Ophthalmology)  Extended Emergency Contact Information Primary Emergency Contact: Arleen Rueben [place] Mobile Phone: [phone] Relation: Other Secondary Emergency Contact: Weiss,Patricia Address: [address] [place] [address] [place] Home Phone: [phone] Mobile Phone: [phone] Relation: Sister  Code Status:  Full Code  Goals of care: Advanced Directive information    12/10/2023    2:46 PM  Advanced Directives  Does Patient Have a Medical Advance Directive? Yes  Type of Estate agent of [place];Living will  Does patient want to make changes to medical advance directive? No - Patient declined  Copy of Healthcare Power of Attorney in Chart? Yes - validated most recent copy scanned in chart (See row information)     Chief Complaint  Patient presents with   Cough    Pt would like chest xray    Discussed the use of AI scribe software for clinical note transcription with the patient, who gave verbal consent to proceed.  History of Present Illness   Tara [name] is a 73 year old female who presents with worsening cough and difficulty managing secretions.  She has a history of respiratory infections and is currently experiencing a worsening cough with difficulty managing secretions. She is unable to effectively expectorate mucus. She has previously tried Mucinex  without success, but Robitussin has been effective in thinning mucus and controlling the cough.  She uses a PEG tube for feeding and is on Osmolite 1.5 at 60 mL per hour. Her caregiver ensures adequate hydration by refilling her water  cup throughout the day.  No chest pain, tightness, fever, or  chills. She occasionally experiences wheezing. There is no swelling in her legs, which is attributed to the use of compression socks.    Past Medical History:  Diagnosis Date   Anemia    on meds   ANKLE PAIN, LEFT 04/01/2008   ANXIETY 04/17/2007   on meds   Arthritis    generalized   Cataract    bilateral sx   Chronic kidney disease    stage 3   Colon polyp    COLONIC POLYPS, HX OF 08/01/2007   CONTUSIONS, MULTIPLE 04/01/2009   DEPRESSION 04/17/2007   on meds   DIZZINESS 08/01/2007   DYSPNEA 08/01/2007   with exertion   Enlargement of lymph nodes 08/13/2007   Excessive involuntary blinking    per pt,going on since 2018   Glaucoma    on meds   GLUCOSE INTOLERANCE 08/01/2007   Hypercalcemia due to sarcoidosis 2014   HYPERLIPIDEMIA 08/01/2007   no meds   HYPERSOMNIA 07/28/2008   HYPERTENSION 04/17/2007   Impaired glucose tolerance 03/23/2011   JOINT EFFUSION, LEFT KNEE 06/02/2010   Loose body in knee 04/01/2009   pt not sure?   Metabolic encephalopathy 12/09/2015-12/2015   Migraines    stopped 3-4 yr ago (07/23/2012)   Morbid obesity (HCC) 04/20/2007   OTHER DISEASES OF LUNG NOT ELSEWHERE CLASSIFIED 08/01/2007   Pain in joint, lower leg 04/01/2009   PERIPHERAL EDEMA 04/21/2009   Pre-diabetes    Sarcoidosis 09/25/2007   Seasonal allergies    SHOULDER PAIN, LEFT 04/01/2009   Sleep apnea    does not use Cpap   Past Surgical History:  Procedure Laterality Date   BREAST SURGERY     Biopsy benign/bil breaST   CARDIOVERSION N/A 12/15/2021   Procedure: CARDIOVERSION;  Surgeon: Raford Riggs, MD;  Location: [facility] ENDOSCOPY;  Service: Cardiovascular;  Laterality: N/A;   CARDIOVERSION N/A 02/09/2022   Procedure: CARDIOVERSION;  Surgeon: Mona Vinie BROCKS, MD;  Location: [facility] ENDOSCOPY;  Service: Cardiovascular;  Laterality: N/A;   CATARACT EXTRACTION     RIGHT EYE   COLONOSCOPY  2020   KN-MAC-suprep(good)-tics/TA's   COMBINED MEDIASTINOSCOPY AND BRONCHOSCOPY  08/2007    COMBINED MEDIASTINOSCOPY AND BRONCHOSCOPY  2009   Dental implant     FRACTURE SURGERY  ?02/1997   left upper arm; put rod in (07/23/2012)   GUM SURGERY  2000-?2009   several ORs; soft tissue graft; took material from roof of mouth (07/23/2012   HUMERUS SURGERY Left 1998   rod insertion   IR GASTROSTOMY TUBE MOD SED  06/22/2022   IR REPLC GASTRO/COLONIC TUBE PERCUT W/FLUORO  10/25/2022   KNEE ARTHROSCOPY  03/2004; 06/2009   right; left Dr. Gerome   KNEE SURGERY  2012   ARTHROSCOPIC LEFT KNEE   LYMPH NODE BIOPSY  ~ 2009   for sarcoidosis; don't know exactly which nodes (07/23/2102)   MYOMECTOMY  1994   Open   POLYPECTOMY  2020   TA's   REFRACTIVE SURGERY  08/1998   both eyes (07/23/2012)   REFRACTIVE SURGERY  2000   Bil   TOTAL KNEE ARTHROPLASTY Left 09/24/2016   Procedure: TOTAL KNEE ARTHROPLASTY;  Surgeon: Yvone Rush, MD;  Location: [facility] OR;  Service: Orthopedics;  Laterality: Left;    Allergies  Allergen Reactions   Sulfa Antibiotics Rash   Fluoxetine Hcl Other (See Comments)    (PROZAC) Suicidal thoughts   Cocoa    Eicosapentaenoic Acid    Omega-3 Fatty Acids (Plant)     Outpatient Encounter Medications as of 02/07/2024  Medication Sig   albuterol  (VENTOLIN  HFA) 108 (90 Base) MCG/ACT inhaler Inhale 2 puffs into the lungs every 6 (six) hours as needed for wheezing or shortness of breath.   Amantadine HCl 100 MG tablet Take by mouth.   apixaban  (ELIQUIS ) 5 MG TABS tablet Place 1 tablet (5 mg total) into feeding tube 2 (two) times daily.   baclofen  (LIORESAL ) 10 MG tablet Take 0.5 tablets (5 mg total) by mouth 3 (three) times daily.   brimonidine -timolol  (COMBIGAN ) 0.2-0.5 % ophthalmic solution Place 1 drop into both eyes every 12 (twelve) hours.   Cholecalciferol  (VITAMIN D -3 PO) Take 1 tablet by mouth every morning.   clorazepate  (TRANXENE ) 7.5 MG tablet TAKE 1 TABLET(7.5 MG) BY MOUTH TWICE DAILY AS NEEDED FOR ANXIETY   desvenlafaxine  (PRISTIQ ) 100 MG 24 hr tablet TAKE 1  TABLET(100 MG) BY MOUTH AT BEDTIME   diltiazem  (CARDIZEM ) 60 MG tablet Take 1 tablet (60 mg total) by mouth every 8 (eight) hours as needed. TAKE 1 TABLET BY FEEDING TUBE EVERY 8 HOURS AS NEEDED   gabapentin  (NEURONTIN ) 100 MG capsule Take 1 capsule (100 mg total) by mouth 3 (three) times daily.   hydrALAZINE  (APRESOLINE ) 100 MG tablet Place 1 tablet (100 mg total) into feeding tube 3 (three) times daily.   ipratropium-albuterol  (DUONEB) 0.5-2.5 (3) MG/3ML SOLN Inhale into the lungs.   isosorbide  dinitrate (ISORDIL ) 10 MG tablet PLACE 1 TABLET INTO FEEDING TUBE THREE TIMES DAILY   latanoprost  (XALATAN ) 0.005 % ophthalmic solution Place 1 drop into both eyes at bedtime.   Multiple Vitamins-Minerals (AIRBORNE PO) Take by mouth in the  morning and at bedtime.   nitrofurantoin , macrocrystal-monohydrate, (MACROBID ) 100 MG capsule Take 1 capsule (100 mg total) by mouth at bedtime. Suppression dosing to prevent a bladder infection   Nutritional Supplements (FEEDING SUPPLEMENT, OSMOLITE 1.5 CAL,) LIQD 60 mLs by Per J Tube route continuous.   nystatin  ointment (MYCOSTATIN ) Apply 1 Application topically 2 (two) times daily.   omeprazole  (PRILOSEC) 40 MG capsule Take 1 capsule (40 mg total) by mouth daily.   UNABLE TO FIND Miscellaneous- Compression stockings bilaterally during the day, left leg brace applied during the day prior to getting up, and bilateral boot for heels at night   valbenazine  (INGREZZA ) 40 MG capsule Take 40 mg by mouth daily.   zaleplon (SONATA) 10 MG capsule Take 20 mg by mouth at bedtime.   Multiple Vitamins-Minerals (MULTIVITAMIN WITH MINERALS) tablet Place 1 tablet into feeding tube daily. (Patient not taking: Reported on 02/07/2024)   No facility-administered encounter medications on file as of 02/07/2024.    Review of Systems  Constitutional:  Negative for appetite change, chills, fatigue, fever and unexpected weight change.  HENT:  Negative for congestion, dental problem, ear  discharge, ear pain, facial swelling, hearing loss, nosebleeds, postnasal drip, rhinorrhea, sinus pressure, sinus pain, sneezing, sore throat, tinnitus and trouble swallowing.   Eyes:  Negative for pain, discharge, redness, itching and visual disturbance.  Respiratory:  Positive for cough. Negative for chest tightness, shortness of breath and wheezing.   Cardiovascular:  Negative for chest pain, palpitations and leg swelling.  Gastrointestinal:  Negative for abdominal distention, abdominal pain, blood in stool, constipation, diarrhea, nausea and vomiting.  Endocrine: Negative for cold intolerance.  Genitourinary:  Negative for difficulty urinating, dysuria, flank pain, frequency and urgency.  Musculoskeletal:  Negative for arthralgias, back pain, gait problem, joint swelling, myalgias, neck pain and neck stiffness.  Skin:  Negative for color change, pallor, rash and wound.  Neurological:  Negative for dizziness, syncope, speech difficulty, weakness, light-headedness, numbness and headaches.  Hematological:  Does not bruise/bleed easily.    Immunization History  Administered Date(s) Administered   Fluad Quad(high Dose 65+) 03/11/2019, 05/27/2020, 04/11/2021   Fluad Trivalent(High Dose 65+) 08/29/2023   Influenza Split 03/29/2013   Influenza Whole 05/09/1999, 05/19/2007, 04/01/2008, 04/21/2009, 03/26/2011   Influenza, High Dose Seasonal PF 03/27/2018, 05/25/2019   Influenza,inj,Quad PF,6+ Mos 03/25/2014, 03/27/2016, 06/13/2017   Influenza-Unspecified 04/15/2012, 04/14/2015   Moderna Sars-Covid-2 Vaccination 08/28/2019, 09/25/2019, 11/30/2019, 05/27/2020, 11/25/2020   Pneumococcal Conjugate-13 03/18/2020   Pneumococcal Polysaccharide-23 06/05/2013, 10/10/2021   Td 07/16/1992, 10/21/2008   Tdap 03/18/2020   Zoster, Live 12/14/2012   Pertinent  Health Maintenance Due  Topic Date Due   OPHTHALMOLOGY EXAM  07/31/2022   INFLUENZA VACCINE  02/14/2024   HEMOGLOBIN A1C  02/26/2024    Colonoscopy  11/22/2024   FOOT EXAM  12/02/2024   MAMMOGRAM  12/11/2025   DEXA SCAN  Completed      03/07/2023    9:58 AM 04/01/2023   10:09 AM 08/29/2023   11:08 AM 12/03/2023    3:54 PM 12/10/2023    2:47 PM  Fall Risk  Falls in the past year? 1 0 0 0 0  Was there an injury with Fall? 0 0 0 0 0  Fall Risk Category Calculator 2 0 0 0 0  Patient at Risk for Falls Due to History of fall(s);Impaired balance/gait;Impaired mobility No Fall Risks No Fall Risks Impaired balance/gait Impaired balance/gait  Fall risk Follow up Falls evaluation completed Falls evaluation completed Falls evaluation completed Falls evaluation completed;Falls  prevention discussed Falls evaluation completed   Functional Status Survey:    Vitals:   02/07/24 0902  BP: 128/80  Pulse: 67  Resp: 19  Temp: 97.6 F (36.4 C)  SpO2: 96%   There is no height or weight on file to calculate BMI. Physical Exam  VITALS: T- 97.6, BP- 128/80, SaO2- 96% GENERAL: Alert, cooperative, well developed, no acute distress HEENT: Normocephalic, normal oropharynx, moist mucous membranes, nose normal, no sinus tenderness NECK: No cervical adenopathy CHEST: Lungs with secretions, no wheezes, rhonchi, or crackles CARDIOVASCULAR: Normal heart rate and rhythm, S1 and S2 normal without murmurs ABDOMEN: Soft, non-tender, non-distended, without organomegaly, normal bowel sounds EXTREMITIES: No cyanosis or edema NEUROLOGICAL: Cranial nerves grossly intact, moves all extremities without gross motor or sensory deficit  SKIN: No rash,no lesion or erythema   PSYCHIATRY/BEHAVIORAL: Mood stable   Labs reviewed: Recent Labs    03/07/23 1125 03/13/23 1634 12/10/23 1541  NA 139 137 141  K 3.9 3.4* 4.0  CL 100 101 104  CO2 32 25 27  GLUCOSE 80 94 71  BUN 23 20 16   CREATININE 0.99 1.07* 1.07*  CALCIUM  9.7 9.6 9.7   Recent Labs    03/07/23 1125 03/13/23 1634 12/10/23 1541  AST 31 27 19   ALT 35 38 18  ALKPHOS 105 101  --    BILITOT 0.5 0.7 0.7  PROT 6.9 6.8 6.5  ALBUMIN  4.0 3.8  --    Recent Labs    03/07/23 1125 03/13/23 1634 12/10/23 1541  WBC 8.5 19.9* 6.9  NEUTROABS 5.8 16.9* 4,451  HGB 14.8 14.7 15.3  HCT 46.1* 45.4 46.6*  MCV 89.1 88.2 87.6  PLT 228.0 221 207   Lab Results  Component Value Date   TSH 1.46 12/10/2023   Lab Results  Component Value Date   HGBA1C 5.3 08/29/2023   Lab Results  Component Value Date   CHOL 157 12/10/2023   HDL 45 (L) 12/10/2023   LDLCALC 83 12/10/2023   LDLDIRECT 59.0 03/07/2023   TRIG 194 (H) 12/10/2023   CHOLHDL 3.5 12/10/2023    Significant Diagnostic Results in last 30 days:  DG Chest 2 View Result Date: 02/13/2024 CLINICAL DATA:  Cough for 1 week. EXAM: CHEST - 2 VIEW COMPARISON:  08/06/2023 FINDINGS: Stable mild cardiomegaly. Both lungs are clear. Intramedullary rod again noted in proximal left humerus. IMPRESSION: Mild cardiomegaly. No active lung disease. Electronically Signed   By: Norleen DELENA Kil M.D.   On: 02/13/2024 19:16   DG BONE DENSITY (DXA) Result Date: 01/31/2024 EXAM: DUAL X-RAY ABSORPTIOMETRY (DXA) FOR BONE MINERAL DENSITY 01/30/2024 1:34 pm CLINICAL DATA:  73 year old Female Postmenopausal. Screening for osteoporosis History of fragility fracture. Patient is or has been on glucocorticoid therapy. TECHNIQUE: An axial (e.g., hips, spine) and/or appendicular (e.g., radius) exam was performed, as appropriate, using GE Secretary/administrator at [employer]. Images are obtained for bone mineral density measurement and are not obtained for diagnostic purposes. [id number] Exclusions: Lumbar spine COMPARISON:  None. New baseline. FINDINGS: Scan quality: Good. LEFT FEMORAL NECK: BMD (in g/cm2): 0.936 T-score: -0.7 Z-score: 1.0 LEFT TOTAL HIP: BMD (in g/cm2): 0.927 T-score: -0.6 Z-score: 0.9 RIGHT FEMORAL NECK: BMD (in g/cm2): 0.880 T-score: -1.1 Z-score: 0.6 RIGHT TOTAL HIP: BMD (in g/cm2): 0.913 T-score: -0.7 Z-score: 0.8 LEFT FOREARM  (RADIUS 33%): BMD (in g/cm2): 0.707 T-score: -1.9 Z-score: 0.0 FRAX 10-YEAR PROBABILITY OF FRACTURE: 10-year fracture risk is performed using the [facility] FRAX calculator based on patient-reported  risk factors. Major osteoporotic fracture: 8.8% Hip fracture: 1.0% Other situations known to alter the reliability of the FRAX score should be considered when making treatment decisions, including chronic glucocorticoid use and past treatments. Further guidance on treatment can be found at the [facility] Osteoporosis Foundation's website https://www.patton.com/. IMPRESSION: Osteopenia based on BMD. Fracture risk is increased. Increased risk is based on low BMD and history of fragility fracture. RECOMMENDATIONS: 1. All patients should optimize calcium  and vitamin D  intake. 2. Consider FDA-approved medical therapies in postmenopausal women and men aged 6 years and older, based on the following: - A hip or vertebral (clinical or morphometric) fracture - T-score less than or equal to -2.5 and secondary causes have been excluded. - Low bone mass (T-score between -1.0 and -2.5) and a 10-year probability of a hip fracture greater than or equal to 3% or a 10-year probability of a major osteoporosis-related fracture greater than or equal to 20% based on the [place] -adapted WHO algorithm. - Clinician judgment and/or patient preferences may indicate treatment for people with 10-year fracture probabilities above or below these levels 3. Patients with diagnosis of osteoporosis or at high risk for fracture should have regular bone mineral density tests. For patients eligible for Medicare, routine testing is allowed once every 2 years. The testing frequency can be increased to one year for patients who have rapidly progressing disease, those who are receiving or discontinuing medical therapy to restore bone mass, or have additional risk factors. Electronically Signed   By: Dina  Arceo M.D.   On: 01/31/2024 07:06     Assessment/Plan  Respiratory infection She is experiencing a cough with secretions she cannot expel, raising concern for a respiratory infection. She denies chest pain, tightness, fever, or chills. Oxygen saturation is 96%, and there is no wheezing. Mucinex  was ineffective, but Robitussin has been helpful in thinning mucus and controlling the cough. - Order chest x-ray to rule out respiratory infection - Advise deep breathing exercises to help loosen secretions - Consider Robitussin if cough persists after x-ray results  Dysphagia She has difficulty chewing and swallowing food, potentially contributing to her cough. This occurs with 'pleasure food' and is unrelated to her PEG tube feeding.  General Health Maintenance She uses compression socks effectively to prevent leg swelling and receives nutrition via PEG tube with Osmolite 1.5 at 60 mL per hour. Adequate hydration is maintained through oral intake. No sores or rashes are present. - Continue use of compression socks - Continue Osmolite 1.5 at 60 mL per hour - Ensure adequate hydration through oral intake   Family/ staff Communication: Reviewed plan of care with patient verbalized understanding   Labs/tests ordered:  Dg chest X-ray   Next Appointment: Return if symptoms worsen or fail to improve.   Total time: 20 minutes. Greater than 50% of total time spent doing patient education regarding cough health maintenance including symptom/medication management.   Roxan JAYSON Plough, NP

## 2024-02-17 ENCOUNTER — Ambulatory Visit: Payer: Self-pay | Admitting: Adult Health

## 2024-02-17 NOTE — Progress Notes (Signed)
 Chest x-ray negative for cardiopulmonary disease.

## 2024-02-18 ENCOUNTER — Other Ambulatory Visit: Payer: Self-pay | Admitting: Internal Medicine

## 2024-02-18 DIAGNOSIS — F411 Generalized anxiety disorder: Secondary | ICD-10-CM

## 2024-02-19 NOTE — Telephone Encounter (Signed)
 Medication was last prescribed by another provider on 01/13/2024 not me.

## 2024-02-20 ENCOUNTER — Telehealth: Payer: Self-pay

## 2024-02-20 MED ORDER — ALBUTEROL SULFATE HFA 108 (90 BASE) MCG/ACT IN AERS: 2.0000 | INHALATION_SPRAY | Freq: Four times a day (QID) | RESPIRATORY_TRACT | 1 refills | Status: AC | PRN

## 2024-02-20 MED ORDER — DESVENLAFAXINE SUCCINATE ER 100 MG PO TB24: 100.0000 mg | ORAL_TABLET | Freq: Every day | ORAL | 1 refills | Status: DC

## 2024-02-20 MED ORDER — DILTIAZEM HCL 60 MG PO TABS: 60.0000 mg | ORAL_TABLET | Freq: Three times a day (TID) | ORAL | 1 refills | Status: DC | PRN

## 2024-02-20 MED ORDER — HYDRALAZINE HCL 100 MG PO TABS: 100.0000 mg | ORAL_TABLET | Freq: Three times a day (TID) | ORAL | 3 refills | Status: AC

## 2024-02-20 MED ORDER — IPRATROPIUM-ALBUTEROL 0.5-2.5 (3) MG/3ML IN SOLN: 3.0000 mL | Freq: Four times a day (QID) | RESPIRATORY_TRACT | 3 refills | Status: AC | PRN

## 2024-02-20 MED ORDER — GABAPENTIN 100 MG PO CAPS: 100.0000 mg | ORAL_CAPSULE | Freq: Three times a day (TID) | ORAL | 5 refills | Status: AC

## 2024-02-20 MED ORDER — CLORAZEPATE DIPOTASSIUM 7.5 MG PO TABS: 7.5000 mg | ORAL_TABLET | Freq: Two times a day (BID) | ORAL | 5 refills | Status: DC | PRN

## 2024-02-20 MED ORDER — OMEPRAZOLE 40 MG PO CPDR: 40.0000 mg | DELAYED_RELEASE_CAPSULE | Freq: Every day | ORAL | 1 refills | Status: AC

## 2024-02-20 MED ORDER — ISOSORBIDE DINITRATE 10 MG PO TABS: 10.0000 mg | ORAL_TABLET | Freq: Three times a day (TID) | ORAL | 1 refills | Status: AC

## 2024-02-20 MED ORDER — OSMOLITE 1.5 CAL PO LIQD: 60.0000 mL | ORAL | Status: DC

## 2024-02-20 MED ORDER — BACLOFEN 10 MG PO TABS: 5.0000 mg | ORAL_TABLET | Freq: Three times a day (TID) | ORAL | 1 refills | Status: DC

## 2024-02-20 NOTE — Telephone Encounter (Signed)
 Patient has 23 listing on her active medication list 3 OTC and one DME listing)  Patient has a neurologist and cardiologist also  I have pended medications for provider to review and sign if she is in agreement with prescribing.

## 2024-02-20 NOTE — Telephone Encounter (Signed)
 Clorazepate  is a controlled medication will need to sign a contract.

## 2024-02-20 NOTE — Telephone Encounter (Signed)
 Noted

## 2024-02-20 NOTE — Telephone Encounter (Signed)
 Notation made on pending appointment for 04/01/24 for patient to sign treatment agreement

## 2024-02-20 NOTE — Telephone Encounter (Signed)
 Copied from CRM #[id number]. Topic: Clinical - Medication Question >> Feb 20, 2024  9:08 AM Cherylann RAMAN wrote: Reason for CRM: Primary Caregiver, Rueben, states that the patient needs a new prescription for clorazepate  (TRANXENE ) 7.5 MG tablet, that reflects her current provider's name Dinah Ngetich. She is also requesting that any other prescriptions that are under Dr. Nicola name be transferred over to NP Dinah Ngetich. Please contact Kiara [phone] for additional information

## 2024-02-25 ENCOUNTER — Other Ambulatory Visit: Payer: Self-pay | Admitting: Family

## 2024-02-25 DIAGNOSIS — R1310 Dysphagia, unspecified: Secondary | ICD-10-CM

## 2024-02-25 NOTE — Telephone Encounter (Signed)
Message routed to PCP Ngetich, Dinah C, NP  

## 2024-02-26 ENCOUNTER — Ambulatory Visit: Payer: Medicare Other | Admitting: Internal Medicine

## 2024-02-26 NOTE — Telephone Encounter (Signed)
 I have spoke to representative and all questions addressed during this call.

## 2024-02-26 NOTE — Telephone Encounter (Unsigned)
 Copied from CRM [id number]. Topic: Clinical - Order For Equipment >> Feb 26, 2024 11:56 AM Miquel SAILOR wrote: Reason for CRM: Joesph with [facility] [phone] Calling on suppies needed due to PT is getting released from Hospital. No order in system but notes from [facility] only for DME. called office and transferred

## 2024-02-26 NOTE — Telephone Encounter (Signed)
 I sent your recent office note 02/07/2024 since you're the ordering DME provider. I will fax Dr.Veludandi office note 01/14/2024 as well. You sent Noted to the patient on MyChart. Message routed back to PCP Ngetich, Roxan BROCKS, NP

## 2024-02-26 NOTE — Telephone Encounter (Signed)
 Ngetich, Roxan BROCKS, NP to Me  (Selected Message)     02/26/24 10:11 AM Please send recent office visit notes for July ,2025 seen by Dr.Veludandi.

## 2024-02-26 NOTE — Telephone Encounter (Signed)
 I have contacted another supplier [facility] [phone]. They stated that they offer all the same supplies. They need demographics, DME/Referral, and Office note. Representative informed me that once all these papers are faxed someone should be in contact within 24/48hours. I have faxed information over to start process [phone]. Message routed to PCP Ngetich, Roxan BROCKS, NP and MyChart message sent to patient.

## 2024-03-02 NOTE — Telephone Encounter (Signed)
 Letter placed on Clinical Intake desk.  Called Joesph [phone] And she gave fax number to fax letter to Fax:[phone]  Letter faxed as requested.

## 2024-03-03 NOTE — Telephone Encounter (Signed)
 Copied from CRM [id number]. Topic: Clinical - Medical Advice >> Mar 03, 2024  2:59 PM Merlynn A wrote: Reason for CRM: Joesph from [employer] called in requesting additional information for intro nutrition supplies for patient. Please contact [phone].

## 2024-03-03 NOTE — Telephone Encounter (Signed)
 Medicare wants to know why patient cannot tolerate bolus feeds. Insurance is now saying they wont cover the Omni pump at all. However they will cover the feeding pump. They want documentation added to previous order if possible and re-faxed to specify why its necessary. Message routed back to PCP Ngetich, Roxan BROCKS, NP

## 2024-03-04 NOTE — Telephone Encounter (Signed)
 PCP has routed message to me. Message routed to clinical intake for today [place].B/CMA to speak to patient point of contact Kiara Tramel and relay any messages from Roxan Plough, NP today as well as re-fax new order information to company [employer]. Previous fax papers given to [place].B/CMA

## 2024-04-01 ENCOUNTER — Telehealth: Payer: Self-pay | Admitting: Internal Medicine

## 2024-04-01 ENCOUNTER — Encounter: Payer: Self-pay | Admitting: Family

## 2024-04-01 ENCOUNTER — Ambulatory Visit: Admitting: Family

## 2024-04-01 VITALS — BP 122/74 | HR 88 | Temp 97.9°F | Resp 20 | Ht 66.0 in | Wt 193.8 lb

## 2024-04-01 DIAGNOSIS — G20B1 Parkinson's disease with dyskinesia, without mention of fluctuations: Secondary | ICD-10-CM

## 2024-04-01 DIAGNOSIS — R1310 Dysphagia, unspecified: Secondary | ICD-10-CM

## 2024-04-01 DIAGNOSIS — I10 Essential (primary) hypertension: Secondary | ICD-10-CM

## 2024-04-01 DIAGNOSIS — I480 Paroxysmal atrial fibrillation: Secondary | ICD-10-CM

## 2024-04-01 DIAGNOSIS — F320 Major depressive disorder, single episode, mild: Secondary | ICD-10-CM

## 2024-04-01 DIAGNOSIS — I482 Chronic atrial fibrillation, unspecified: Secondary | ICD-10-CM

## 2024-04-01 DIAGNOSIS — Z789 Other specified health status: Secondary | ICD-10-CM

## 2024-04-01 DIAGNOSIS — E7800 Pure hypercholesterolemia, unspecified: Secondary | ICD-10-CM

## 2024-04-01 DIAGNOSIS — F411 Generalized anxiety disorder: Secondary | ICD-10-CM

## 2024-04-01 DIAGNOSIS — G2401 Drug induced subacute dyskinesia: Secondary | ICD-10-CM

## 2024-04-01 DIAGNOSIS — D869 Sarcoidosis, unspecified: Secondary | ICD-10-CM

## 2024-04-01 MED ORDER — APIXABAN 5 MG PO TABS: 5.0000 mg | ORAL_TABLET | Freq: Two times a day (BID) | ORAL | 0 refills | Status: DC

## 2024-04-01 NOTE — Progress Notes (Signed)
 Provider: Roxan Plough FNP-C   [name], Roxan BROCKS, NP  Patient Care Team: Amiria Orrison, Roxan BROCKS, NP as PCP - General (Family Medicine) Alvan Ronal BRAVO, MD (Inactive) as PCP - Cardiology (Cardiology) Patrcia Sharper, MD as Consulting Physician (Ophthalmology)  Extended Emergency Contact Information Primary Emergency Contact: Tramel,Kiara  [place] Mobile Phone: [phone] Relation: Other Secondary Emergency Contact: Weiss,Patricia Address: [address] [place] [address] [place] Home Phone: [phone] Mobile Phone: [phone] Relation: Sister  Code Status:  Full Code  Goals of care: Advanced Directive information    04/01/2024    2:09 PM  Advanced Directives  Does Patient Have a Medical Advance Directive? Yes  Type of Estate agent of [place];Living will  Does patient want to make changes to medical advance directive? No - Patient declined  Copy of Healthcare Power of Attorney in Chart? Yes - validated most recent copy scanned in chart (See row information)     Chief Complaint  Patient presents with   Medical Management of Chronic Issues    4 Month follow up & sign treatment agreement.    Discussed the use of AI scribe software for clinical note transcription with the patient, who gave verbal consent to proceed.  History of Present Illness   Tara [name] is a 73 year old female with, Sarcoidosis, dysphagia on Continuous Tube feeding via PEG tube, Chronic Afib, Hypertension, Chronic Kidney disease stage 3, generalized anxiety disorder, Major depression, Parkinson's disease and tardive dyskinesia who presents for a four-month follow-up. Tardive dyskinesia has worsened since the last visit, and emotional symptoms related to it have increased. She is seeking a referral to a neurologist for a thorough evaluation as medication adjustments have been ineffective.  Nutritional intake is a concern as she is  eating at a level that was appropriate when she was more active. Her weight has decreased from 208 pounds to 193 pounds since the last visit. She is seeking a referral to a nutritionist to help manage her dietary needs. She is currently using a Kangaroo pump for tube feeding Nutren 1.5 , which runs every other day from 10 AM to 10 AM the next day, with water  flushes every six hours. She drinks water  by mouth when not on the pump.  She experiences constipation, which is managed with watermelon, but she is cautious about giving it daily due to concerns about chewing and swallowing. She ensures not to go beyond three days without a bowel movement.  No issues with anxiety, depression, nausea, or vomiting. She is currently under the care of a psychiatrist. She does not participate in any activities.   Past Medical History:  Diagnosis Date   Anemia    on meds   ANKLE PAIN, LEFT 04/01/2008   ANXIETY 04/17/2007   on meds   Arthritis    generalized   Cataract    bilateral sx   Chronic kidney disease    stage 3   Colon polyp    COLONIC POLYPS, HX OF 08/01/2007   CONTUSIONS, MULTIPLE 04/01/2009   DEPRESSION 04/17/2007   on meds   DIZZINESS 08/01/2007   DYSPNEA 08/01/2007   with exertion   Enlargement of lymph nodes 08/13/2007   Excessive involuntary blinking    per pt,going on since 2018   Glaucoma    on meds   GLUCOSE INTOLERANCE 08/01/2007   Hypercalcemia due to sarcoidosis 2014   HYPERLIPIDEMIA 08/01/2007  no meds   HYPERSOMNIA 07/28/2008   HYPERTENSION 04/17/2007   Impaired glucose tolerance 03/23/2011   JOINT EFFUSION, LEFT KNEE 06/02/2010   Loose body in knee 04/01/2009   pt not sure?   Metabolic encephalopathy 12/09/2015-12/2015   Migraines    stopped 3-4 yr ago (07/23/2012)   Morbid obesity (HCC) 04/20/2007   OTHER DISEASES OF LUNG NOT ELSEWHERE CLASSIFIED 08/01/2007   Pain in joint, lower leg 04/01/2009   PERIPHERAL EDEMA 04/21/2009   Pre-diabetes    Sarcoidosis  09/25/2007   Seasonal allergies    SHOULDER PAIN, LEFT 04/01/2009   Sleep apnea    does not use Cpap   Past Surgical History:  Procedure Laterality Date   BREAST SURGERY     Biopsy benign/bil breaST   CARDIOVERSION N/A 12/15/2021   Procedure: CARDIOVERSION;  Surgeon: Raford Riggs, MD;  Location: [facility] ENDOSCOPY;  Service: Cardiovascular;  Laterality: N/A;   CARDIOVERSION N/A 02/09/2022   Procedure: CARDIOVERSION;  Surgeon: Mona Vinie BROCKS, MD;  Location: [facility] ENDOSCOPY;  Service: Cardiovascular;  Laterality: N/A;   CATARACT EXTRACTION     RIGHT EYE   COLONOSCOPY  2020   KN-MAC-suprep(good)-tics/TA's   COMBINED MEDIASTINOSCOPY AND BRONCHOSCOPY  08/2007   COMBINED MEDIASTINOSCOPY AND BRONCHOSCOPY  2009   Dental implant     FRACTURE SURGERY  ?02/1997   left upper arm; put rod in (07/23/2012)   GUM SURGERY  2000-?2009   several ORs; soft tissue graft; took material from roof of mouth (07/23/2012   HUMERUS SURGERY Left 1998   rod insertion   IR GASTROSTOMY TUBE MOD SED  06/22/2022   IR REPLC GASTRO/COLONIC TUBE PERCUT W/FLUORO  10/25/2022   KNEE ARTHROSCOPY  03/2004; 06/2009   right; left Dr. Gerome   KNEE SURGERY  2012   ARTHROSCOPIC LEFT KNEE   LYMPH NODE BIOPSY  ~ 2009   for sarcoidosis; don't know exactly which nodes (07/23/2102)   MYOMECTOMY  1994   Open   POLYPECTOMY  2020   TA's   REFRACTIVE SURGERY  08/1998   both eyes (07/23/2012)   REFRACTIVE SURGERY  2000   Bil   TOTAL KNEE ARTHROPLASTY Left 09/24/2016   Procedure: TOTAL KNEE ARTHROPLASTY;  Surgeon: Yvone Rush, MD;  Location: [facility] OR;  Service: Orthopedics;  Laterality: Left;    Allergies  Allergen Reactions   Sulfa Antibiotics Rash   Fluoxetine Hcl Other (See Comments)    (PROZAC) Suicidal thoughts   Cocoa    Eicosapentaenoic Acid    Omega-3 Fatty Acids (Plant)     Allergies as of 04/01/2024       Reactions   Sulfa Antibiotics Rash   Fluoxetine Hcl Other (See Comments)   (PROZAC) Suicidal thoughts    Cocoa    Eicosapentaenoic Acid    Omega-3 Fatty Acids (plant)         Medication List        Accurate as of April 01, 2024 10:34 PM. If you have any questions, ask your nurse or doctor.          AIRBORNE PO Take by mouth in the morning and at bedtime. What changed: Another medication with the same name was removed. Continue taking this medication, and follow the directions you see here. Changed by: Anab Vivar C Reg Bircher   albuterol  108 (90 Base) MCG/ACT inhaler Commonly known as: VENTOLIN  HFA Inhale 2 puffs into the lungs every 6 (six) hours as needed for wheezing or shortness of breath.   Amantadine HCl 100 MG tablet Take by mouth.  apixaban  5 MG Tabs tablet Commonly known as: Eliquis  Take 1 tablet (5 mg total) by mouth 2 (two) times daily. What changed: how to take this Changed by: Alvan Ronal BRAVO   baclofen  10 MG tablet Commonly known as: LIORESAL  Take 0.5 tablets (5 mg total) by mouth 3 (three) times daily.   brimonidine -timolol  0.2-0.5 % ophthalmic solution Commonly known as: Combigan  Place 1 drop into both eyes every 12 (twelve) hours.   clorazepate  7.5 MG tablet Commonly known as: TRANXENE  Take 1 tablet (7.5 mg total) by mouth 2 (two) times daily as needed for anxiety (for anxiety).   desvenlafaxine  100 MG 24 hr tablet Commonly known as: PRISTIQ  Take 1 tablet (100 mg total) by mouth at bedtime.   diltiazem  60 MG tablet Commonly known as: CARDIZEM  Take 1 tablet (60 mg total) by mouth every 8 (eight) hours as needed. TAKE 1 TABLET BY FEEDING TUBE EVERY 8 HOURS AS NEEDED   gabapentin  100 MG capsule Commonly known as: NEURONTIN  Take 1 capsule (100 mg total) by mouth 3 (three) times daily.   hydrALAZINE  100 MG tablet Commonly known as: APRESOLINE  Place 1 tablet (100 mg total) into feeding tube 3 (three) times daily.   ipratropium-albuterol  0.5-2.5 (3) MG/3ML Soln Commonly known as: DUONEB Inhale 3 mLs into the lungs every 6 (six) hours as needed.  Inhale into the lungs.   isosorbide  dinitrate 10 MG tablet Commonly known as: ISORDIL  Take 1 tablet (10 mg total) by mouth 3 (three) times daily.   latanoprost  0.005 % ophthalmic solution Commonly known as: XALATAN  Place 1 drop into both eyes at bedtime.   nitrofurantoin  (macrocrystal-monohydrate) 100 MG capsule Commonly known as: MACROBID  Take 1 capsule (100 mg total) by mouth at bedtime. Suppression dosing to prevent a bladder infection   NUTREN 1.5 PO Take by mouth. 1.5 @ 17ml/hr every 24 hours What changed: Another medication with the same name was removed. Continue taking this medication, and follow the directions you see here. Changed by: Roxan BROCKS Brydan Downard   nystatin  ointment Commonly known as: MYCOSTATIN  Apply 1 Application topically 2 (two) times daily.   omeprazole  40 MG capsule Commonly known as: PRILOSEC Take 1 capsule (40 mg total) by mouth daily.   UNABLE TO FIND Miscellaneous- Compression stockings bilaterally during the day, left leg brace applied during the day prior to getting up, and bilateral boot for heels at night   valbenazine  40 MG capsule Commonly known as: INGREZZA  Take 40 mg by mouth daily.   VITAMIN D -3 PO Take 1 tablet by mouth every morning.   zaleplon 10 MG capsule Commonly known as: SONATA Take 20 mg by mouth at bedtime.        Review of Systems  Unable to perform ROS: Other (Information provided by car giver)  Constitutional:  Negative for appetite change, chills, fatigue, fever and unexpected weight change.  HENT:  Negative for congestion, dental problem, ear discharge, ear pain, facial swelling, hearing loss, nosebleeds, postnasal drip, rhinorrhea, sinus pressure, sinus pain, sneezing, sore throat, tinnitus and trouble swallowing.   Eyes:  Negative for pain, discharge, redness, itching and visual disturbance.  Respiratory:  Negative for cough, chest tightness, shortness of breath and wheezing.   Cardiovascular:  Negative for chest  pain, palpitations and leg swelling.  Gastrointestinal:  Negative for abdominal distention, abdominal pain, blood in stool, constipation, diarrhea, nausea and vomiting.  Endocrine: Negative for cold intolerance, heat intolerance, polydipsia, polyphagia and polyuria.  Genitourinary:  Negative for difficulty urinating, dysuria, flank pain, frequency and urgency.  Musculoskeletal:  Positive for gait problem. Negative for arthralgias, back pain, joint swelling, myalgias, neck pain and neck stiffness.  Skin:  Negative for color change, pallor, rash and wound.  Neurological:  Negative for dizziness, syncope, speech difficulty, weakness, light-headedness, numbness and headaches.       TD  Hematological:  Does not bruise/bleed easily.  Psychiatric/Behavioral:  Negative for agitation, behavioral problems, confusion, hallucinations, self-injury, sleep disturbance and suicidal ideas. The patient is not nervous/anxious.     Immunization History  Administered Date(s) Administered   Fluad Quad(high Dose 65+) 03/11/2019, 05/27/2020, 04/11/2021   Fluad Trivalent(High Dose 65+) 08/29/2023   INFLUENZA, HIGH DOSE SEASONAL PF 03/27/2018, 05/25/2019   Influenza Split 03/29/2013   Influenza Whole 05/09/1999, 05/19/2007, 04/01/2008, 04/21/2009, 03/26/2011   Influenza,inj,Quad PF,6+ Mos 03/25/2014, 03/27/2016, 06/13/2017   Influenza-Unspecified 04/15/2012, 04/14/2015   Moderna Sars-Covid-2 Vaccination 08/28/2019, 09/25/2019, 11/30/2019, 05/27/2020, 11/25/2020   Pneumococcal Conjugate-13 03/18/2020   Pneumococcal Polysaccharide-23 06/05/2013, 10/10/2021   Td 07/16/1992, 10/21/2008   Tdap 03/18/2020   Zoster, Live 12/14/2012   Pertinent  Health Maintenance Due  Topic Date Due   OPHTHALMOLOGY EXAM  07/31/2022   Influenza Vaccine  02/14/2024   HEMOGLOBIN A1C  02/26/2024   Colonoscopy  11/22/2024   FOOT EXAM  12/02/2024   DEXA SCAN  Completed   Mammogram  Discontinued      04/01/2023   10:09 AM 08/29/2023    11:08 AM 12/03/2023    3:54 PM 12/10/2023    2:47 PM 04/01/2024    2:04 PM  Fall Risk  Falls in the past year? 0 0 0 0 0  Was there an injury with Fall? 0 0 0 0 0  Fall Risk Category Calculator 0 0 0 0 0  Patient at Risk for Falls Due to No Fall Risks No Fall Risks Impaired balance/gait Impaired balance/gait No Fall Risks  Fall risk Follow up Falls evaluation completed Falls evaluation completed Falls evaluation completed;Falls prevention discussed Falls evaluation completed Falls evaluation completed   Functional Status Survey:    Vitals:   04/01/24 1412  BP: 122/74  Pulse: 88  Resp: 20  Temp: 97.9 F (36.6 C)  SpO2: 97%  Weight: 193 lb 12.8 oz (87.9 kg)  Height: 5' 6 (1.676 m)   Body mass index is 31.28 kg/m. Physical Exam Physical Exam   VITALS: T- 97.9, P- 88, BP- 122/74, SaO2- 97% MEASUREMENTS: Weight- 193. GENERAL: Alert, cooperative, well developed, no acute distress HEENT: Normocephalic, normal oropharynx, moist mucous membranes, ears and nose normal CHEST: Clear to auscultation bilaterally, no wheezes, rhonchi, or crackles CARDIOVASCULAR: Normal heart rate and rhythm, S1 and S2 normal without murmurs BREAST: Breasts normal ABDOMEN: Soft, non-tender, non-distended, without organomegaly, normal bowel sounds EXTREMITIES: No cyanosis or edema, extremities normal NEUROLOGICAL: TD limits Coordination Cranial nerves grossly intact, moves all extremities without gross motor or sensory deficit SKIN: Skin normal Psychiatry/ Behavior: Mood stable      Labs reviewed: Recent Labs    12/10/23 1541  NA 141  K 4.0  CL 104  CO2 27  GLUCOSE 71  BUN 16  CREATININE 1.07*  CALCIUM  9.7   Recent Labs    12/10/23 1541  AST 19  ALT 18  BILITOT 0.7  PROT 6.5   Recent Labs    12/10/23 1541  WBC 6.9  NEUTROABS 4,451  HGB 15.3  HCT 46.6*  MCV 87.6  PLT 207   Lab Results  Component Value Date   TSH 1.46 12/10/2023   Lab Results  Component  Value Date    HGBA1C 5.3 08/29/2023   Lab Results  Component Value Date   CHOL 157 12/10/2023   HDL 45 (L) 12/10/2023   LDLCALC 83 12/10/2023   LDLDIRECT 59.0 03/07/2023   TRIG 194 (H) 12/10/2023   CHOLHDL 3.5 12/10/2023    Significant Diagnostic Results in last 30 days:  No results found.  Assessment/Plan Assessment and Plan    Tardive dyskinesia Chronic tardive dyskinesia with worsening symptoms. - Refer to neurologist for further evaluation and management.  Parkinson disease with dyskinesia Parkinson disease with dyskinesia requiring ongoing management.  Chronic kidney disease stage 3 Chronic kidney disease stage 3 confirmed by recent evaluation with Dr. Tobie. Blood work results are pending. - Request release of medical information to obtain recent lab work from Dr. Tobie.  Chronic atrial fibrillation Chronic atrial fibrillation.  Hypertension Hypertension, well-controlled with current blood pressure at 122/74 mmHg.  Hyperlipidemia Hyperlipidemia.  Sarcoidosis Sarcoidosis.  Constipation Intermittent constipation managed with dietary modifications such as watermelon. Caregiver is cautious about daily watermelon intake due to potential swallowing issues. - Advise to ensure bowel movements occur at least every three days. - Encourage dietary modifications to manage constipation.  Enteral nutrition dependence/Dysphagia  Dependent on enteral nutrition with a Kangaroo pump. Current regimen includes feeding Nutren 1.5 every other day with 24-hour continuous feeding starting at 10 AM. Water  flushes every six hours. Nutritional needs are being met with current formula. Request referral to Nutritionist for tube feedings management - Refer to nutritionist for evaluation and guidance on enteral nutrition.  Depression and anxiety disorder Depression and anxiety disorder, well-managed with no reported worsening of symptoms. Managed by a psychiatrist via Zoom consultations.           Family/ staff Communication: Reviewed plan of care with patient  Labs/tests ordered: - CBC with Differential/Platelet - CMP with eGFR([facility]) - TSH - Lipid panel  Next Appointment : Return in about 6 months (around 09/29/2024) for medical mangement of chronic issues., Fasting labs in 6 months prior to visit.   Spent 30 minutes of Face to face and non-face to face with patient  >50% time spent counseling; reviewing medical record; tests; labs; documentation and developing future plan of care.   Roxan JAYSON Plough, NP

## 2024-04-01 NOTE — Telephone Encounter (Signed)
 Routed to [facility]

## 2024-04-01 NOTE — Telephone Encounter (Signed)
 Prescription refill request for Eliquis  received. Indication: a fib Last office visit: 09/03/23 Scr: 1.07 Age: 73 Weight: 94kg

## 2024-04-01 NOTE — Telephone Encounter (Signed)
*  STAT* If patient is at the pharmacy, call can be transferred to refill team.   1. Which medications need to be refilled? (please list name of each medication and dose if known) apixaban (ELIQUIS) 5 MG TABS tablet  2. Which pharmacy/location (including street and city if local pharmacy) is medication to be sent to? [id number] - [place], [place] - [address]  3. Do they need a 30 day or 90 day supply? 90

## 2024-04-06 ENCOUNTER — Other Ambulatory Visit: Payer: Self-pay | Admitting: Family

## 2024-04-06 DIAGNOSIS — G20B1 Parkinson's disease with dyskinesia, without mention of fluctuations: Secondary | ICD-10-CM

## 2024-04-06 DIAGNOSIS — R1310 Dysphagia, unspecified: Secondary | ICD-10-CM

## 2024-04-06 DIAGNOSIS — Z789 Other specified health status: Secondary | ICD-10-CM

## 2024-04-06 NOTE — Progress Notes (Unsigned)
 Secure Message received from referral co-ordinator states referral wassent to Nutritionist for tube feeding management but was declined Nutritionist can only see patient if taking food by oral but not tube feeding.[facility] referral was recommended.patient POA already notified by referral coordinator.New order placed.

## 2024-04-07 NOTE — Progress Notes (Signed)
 Noted

## 2024-04-13 ENCOUNTER — Telehealth: Payer: Self-pay

## 2024-04-13 NOTE — Telephone Encounter (Unsigned)
 Copied from CRM [id number]. Topic: Clinical - Order For Equipment >> Apr 10, 2024  4:30 PM Miquel SAILOR wrote: Reason for CRM: Ambulatory Referral for DME (Order # [id number]) on 04/06/2024-Carrie from [facility]/[phone]:Office calling due to PT referred they do not do service at the location. Referral needs to be changed . Needs call back [phone]

## 2024-04-20 ENCOUNTER — Other Ambulatory Visit: Payer: Self-pay | Admitting: Family

## 2024-04-20 MED ORDER — CLORAZEPATE DIPOTASSIUM 7.5 MG PO TABS: 7.5000 mg | ORAL_TABLET | Freq: Two times a day (BID) | ORAL | 5 refills | Status: DC | PRN

## 2024-04-20 NOTE — Telephone Encounter (Unsigned)
 Copied from CRM #[id number]. Topic: Clinical - Medication Refill >> Apr 20, 2024 10:53 AM Miquel SAILOR wrote: Medication: clorazepate  (TRANXENE ) 7.5 MG tablet  Has the patient contacted their pharmacy?  (Agent: If no, request that the patient contact the pharmacy for the refill. If patient does not wish to contact the pharmacy document the reason why and proceed with request.) (Agent: If yes, when and what did the pharmacy advise?)  This is the patient's preferred pharmacy:      [id number] - [place], [place] - [address] [facility] [address] [place] [address] Phone: [phone] Fax: [phone]   Is this the correct pharmacy for this prescription? Yes If no, delete pharmacy and type the correct one.   Has the prescription been filled recently? Yes  Is the patient out of the medication? No 5 days left-Patient Nurse Rueben requesting due to will run out soon   Has the patient been seen for an appointment in the last year OR does the patient have an upcoming appointment? Yes  Can we respond through MyChart? Yes  Agent: Please be advised that Rx refills may take up to 3 business days. We ask that you follow-up with your pharmacy.

## 2024-04-21 ENCOUNTER — Telehealth: Payer: Self-pay

## 2024-04-21 NOTE — Telephone Encounter (Signed)
 Copied from CRM [id number]. Topic: Clinical - Medication Question >> Apr 21, 2024 10:45 AM Merlynn A wrote: Reason for CRM: Patients caregiver called in to confirm clorazepate  (TRANXENE ) 7.5 MG tablet  was sent to pharmacy. Advised medication was sent 10/6 and to follow up with pharmacy. >> Apr 21, 2024 11:41 AM DeAngela L wrote: Rosalea the patients PCP is calling to ask if the medication refill has been completed for he patient, she is asking cause the patient doesn't have many pills left and also th provider is going on vacation so she wanted to make sure this is refilled for the patient, and she is at the pharmacy now

## 2024-05-15 LAB — CBC WITH DIFFERENTIAL/PLATELET
Absolute Lymphocytes: 1546 {cells}/uL (ref 850–3900)
Absolute Monocytes: 662 {cells}/uL (ref 200–950)
Basophils Absolute: 48 {cells}/uL (ref 0–200)
Basophils Relative: 0.7 %
Eosinophils Absolute: 193 {cells}/uL (ref 15–500)
Eosinophils Relative: 2.8 %
HCT: 46.6 % — ABNORMAL HIGH (ref 35.0–45.0)
Hemoglobin: 15.3 g/dL (ref 11.7–15.5)
MCH: 28.8 pg (ref 27.0–33.0)
MCHC: 32.8 g/dL (ref 32.0–36.0)
MCV: 87.6 fL (ref 80.0–100.0)
MPV: 10.8 fL (ref 7.5–12.5)
Monocytes Relative: 9.6 %
Neutro Abs: 4451 {cells}/uL (ref 1500–7800)
Neutrophils Relative %: 64.5 %
Platelets: 207 Thousand/uL (ref 140–400)
RBC: 5.32 Million/uL — ABNORMAL HIGH (ref 3.80–5.10)
RDW: 13.7 % (ref 11.0–15.0)
Total Lymphocyte: 22.4 %
WBC: 6.9 Thousand/uL (ref 3.8–10.8)

## 2024-05-15 LAB — COMPLETE METABOLIC PANEL WITHOUT GFR
AG Ratio: 1.7 (calc) (ref 1.0–2.5)
ALT: 18 U/L (ref 6–29)
AST: 19 U/L (ref 10–35)
Albumin: 4.1 g/dL (ref 3.6–5.1)
Alkaline phosphatase (APISO): 128 U/L (ref 37–153)
BUN/Creatinine Ratio: 15 (calc) (ref 6–22)
BUN: 16 mg/dL (ref 7–25)
CO2: 27 mmol/L (ref 20–32)
Calcium: 9.7 mg/dL (ref 8.6–10.4)
Chloride: 104 mmol/L (ref 98–110)
Creat: 1.07 mg/dL — ABNORMAL HIGH (ref 0.60–1.00)
Globulin: 2.4 g/dL (ref 1.9–3.7)
Glucose, Bld: 71 mg/dL (ref 65–139)
Potassium: 4 mmol/L (ref 3.5–5.3)
Sodium: 141 mmol/L (ref 135–146)
Total Bilirubin: 0.7 mg/dL (ref 0.2–1.2)
Total Protein: 6.5 g/dL (ref 6.1–8.1)

## 2024-05-15 LAB — VITAMIN D 1,25 DIHYDROXY
Vitamin D 1, 25 (OH)2 Total: 35 pg/mL (ref 18–72)
Vitamin D2 1, 25 (OH)2: <8 pg/mL
Vitamin D3 1, 25 (OH)2: 35 pg/mL

## 2024-05-15 LAB — LIPID PANEL
Cholesterol: 157 mg/dL (ref <200)
HDL: 45 mg/dL — ABNORMAL LOW (ref >=50)
LDL Cholesterol (Calc): 83 mg/dL
Non-HDL Cholesterol (Calc): 112 mg/dL (ref <130)
Total CHOL/HDL Ratio: 3.5 (calc) (ref <5.0)
Triglycerides: 194 mg/dL — ABNORMAL HIGH (ref <150)

## 2024-05-15 LAB — TSH: TSH: 1.46 m[IU]/L (ref 0.40–4.50)

## 2024-05-15 LAB — VITAMIN B12: Vitamin B-12: 796 pg/mL (ref 200–1100)

## 2024-05-20 ENCOUNTER — Other Ambulatory Visit: Payer: Self-pay

## 2024-05-20 ENCOUNTER — Emergency Department (HOSPITAL_COMMUNITY)

## 2024-05-20 ENCOUNTER — Emergency Department (HOSPITAL_COMMUNITY)
Admission: EM | Admit: 2024-05-20 | Discharge: 2024-05-21 | Disposition: A | Discharge status: Home / Self Care | Attending: Emergency Medicine | Admitting: Emergency Medicine

## 2024-05-20 ENCOUNTER — Encounter (HOSPITAL_COMMUNITY): Payer: Self-pay | Admitting: Emergency Medicine

## 2024-05-20 DIAGNOSIS — Z931 Gastrostomy status: Secondary | ICD-10-CM | POA: Diagnosis not present

## 2024-05-20 DIAGNOSIS — Z4659 Encounter for fitting and adjustment of other gastrointestinal appliance and device: Secondary | ICD-10-CM | POA: Diagnosis present

## 2024-05-20 MED ORDER — IOHEXOL 9 MG/ML PO SOLN: 250.0000 mL | ORAL | Status: AC

## 2024-05-20 MED ORDER — IOHEXOL 300 MG/ML  SOLN
30.0000 mL | Freq: Once | INTRAMUSCULAR | Status: AC | PRN
Start: 2024-05-20 — End: 2024-05-20
  Administered 2024-05-20: 30 mL

## 2024-05-20 NOTE — ED Triage Notes (Signed)
 Pt BIBA from home, pt reports she felt a tug from G-tube, now c/o pain and pressure coming from G-tube site since yesterday. Caregiver noted blood around the tube site today. Hx. Afib and tardive dyskinesia. A&Ox4

## 2024-05-20 NOTE — ED Provider Notes (Incomplete)
   EMERGENCY DEPARTMENT AT [facility] Provider Note   CSN: [id number] Arrival date & time: 05/20/24  2157     History Chief Complaint  Patient presents with  . G-tube problem    HPI Tara [name] is a 73 y.o. female presenting for ***.   Patient's recorded medical, surgical, social, medication list and allergies were reviewed in the Snapshot window as part of the initial history.   Review of Systems   Review of Systems  Physical Exam Updated Vital Signs BP 96/76   Pulse 77   Temp 98.6 F (37 C) (Oral)   Resp 16   SpO2 99%  Physical Exam   ED Course/ Medical Decision Making/ A&P    Procedures Procedures   Medications Ordered in ED Medications - No data to display  Medical Decision Making:   Tara [name] is a 73 y.o. female who presented to the ED today with *** detailed above.    {crccomplexity:27900} Complete initial physical exam performed, notably the patient  was ***.    Reviewed and confirmed nursing documentation for past medical history, family history, social history.    Initial Assessment:   With the patient's presentation of ***, most likely diagnosis is ***. Other diagnoses were considered including (but not limited to) ***. These are considered less likely due to history of present illness and physical exam findings.   {crccopa:27899}  Initial Plan:  ***  ***Screening labs including CBC and Metabolic panel to evaluate for infectious or metabolic etiology of disease.  ***Urinalysis with reflex culture ordered to evaluate for UTI or relevant urologic/nephrologic pathology.  ***CXR to evaluate for structural/infectious intrathoracic pathology.  {crccardiactesting:32591::EKG to evaluate for cardiac pathology} Objective evaluation as below reviewed   Initial Study Results:   Laboratory  All laboratory results reviewed without evidence of clinically relevant pathology.   ***Exceptions include: ***   ***EKG EKG was reviewed  independently. Rate, rhythm, axis, intervals all examined and without medically relevant abnormality. ST segments without concerns for elevations.    Radiology:  All images reviewed independently. ***Agree with radiology report at this time.   No results found.    Consults: Case discussed with ***.   Reassessment and Plan:   ***    ***  Clinical Impression: No diagnosis found.   Data Unavailable   Final Clinical Impression(s) / ED Diagnoses Final diagnoses:  None    Rx / DC Orders ED Discharge Orders     None

## 2024-05-20 NOTE — ED Provider Notes (Signed)
  [place] EMERGENCY DEPARTMENT AT [facility] Provider Note   CSN: [id number] Arrival date & time: 05/20/24  2157     History Chief Complaint  Patient presents with  . G-tube problem    HPI Tara [name] is a 73 y.o. female presenting for chief conmplaint of g-tube replacement. Has not had it replaced in a year. Placed for poor PO intake.  Patient's recorded medical, surgical, social, medication list and allergies were reviewed in the Snapshot window as part of the initial history.   Review of Systems   Review of Systems  Physical Exam Updated Vital Signs BP 96/76   Pulse 77   Temp 98.6 F (37 C) (Oral)   Resp 16   SpO2 99%  Physical Exam   ED Course/ Medical Decision Making/ A&P    Procedures Procedures   Medications Ordered in ED Medications - No data to display  Medical Decision Making:   Tara [name] is a 73 y.o. female who presented to the ED today with *** detailed above.    {crccomplexity:27900} Complete initial physical exam performed, notably the patient  was ***.    Reviewed and confirmed nursing documentation for past medical history, family history, social history.    Initial Assessment:   With the patient's presentation of ***, most likely diagnosis is ***. Other diagnoses were considered including (but not limited to) ***. These are considered less likely due to history of present illness and physical exam findings.   {crccopa:27899}  Initial Plan:  ***  ***Screening labs including CBC and Metabolic panel to evaluate for infectious or metabolic etiology of disease.  ***Urinalysis with reflex culture ordered to evaluate for UTI or relevant urologic/nephrologic pathology.  ***CXR to evaluate for structural/infectious intrathoracic pathology.  {crccardiactesting:32591::EKG to evaluate for cardiac pathology} Objective evaluation as below reviewed   Initial Study Results:   Laboratory  All laboratory results reviewed without  evidence of clinically relevant pathology.   ***Exceptions include: ***   ***EKG EKG was reviewed independently. Rate, rhythm, axis, intervals all examined and without medically relevant abnormality. ST segments without concerns for elevations.    Radiology:  All images reviewed independently. ***Agree with radiology report at this time.   No results found.    Consults: Case discussed with ***.   Reassessment and Plan:   ***    ***  Clinical Impression: No diagnosis found.   Data Unavailable   Final Clinical Impression(s) / ED Diagnoses Final diagnoses:  None    Rx / DC Orders ED Discharge Orders     None

## 2024-05-20 NOTE — ED Notes (Signed)
 Caregiver at bedside

## 2024-05-29 ENCOUNTER — Other Ambulatory Visit: Payer: Self-pay | Admitting: Family

## 2024-05-29 NOTE — Telephone Encounter (Signed)
 Copied from CRM #[id number]. Topic: Clinical - Medication Refill >> May 29, 2024  1:44 PM Laurier C wrote: Medication: baclofen  (LIORESAL ) 10 MG tablet  Patient states the meds should be 5MG   Has the patient contacted their pharmacy? Yes (Agent: If no, request that the patient contact the pharmacy for the refill. If patient does not wish to contact the pharmacy document the reason why and proceed with request.) (Agent: If yes, when and what did the pharmacy advise?)  This is the patient's preferred pharmacy:  [id number] - [place], [place] - [address] AT [facility] [address] [place] [address] Phone: [phone] Fax: [phone]   Is this the correct pharmacy for this prescription? Yes  Has the prescription been filled recently? No  Is the patient out of the medication? No  Has the patient been seen for an appointment in the last year OR does the patient have an upcoming appointment? Yes  Can we respond through MyChart? Yes  Agent: Please be advised that Rx refills may take up to 3 business days. We ask that you follow-up with your pharmacy.

## 2024-06-01 MED ORDER — BACLOFEN 10 MG PO TABS: 5.0000 mg | ORAL_TABLET | Freq: Three times a day (TID) | ORAL | 1 refills | Status: DC

## 2024-06-03 ENCOUNTER — Other Ambulatory Visit: Payer: Self-pay

## 2024-06-03 MED ORDER — BACLOFEN 5 MG PO TABS: 5.0000 mg | ORAL_TABLET | Freq: Three times a day (TID) | ORAL | 1 refills | Status: AC

## 2024-06-03 NOTE — Telephone Encounter (Signed)
 Patient's caregiver walk in the office to request to for Baclofen  5 mg because the dose of the medication was incorrect. Rx has been sent to [facility] in [place]

## 2024-06-12 ENCOUNTER — Emergency Department (HOSPITAL_COMMUNITY): Admission: EM | Admit: 2024-06-12 | Discharge: 2024-06-12 | Disposition: A | Discharge status: Home / Self Care

## 2024-06-12 ENCOUNTER — Encounter (HOSPITAL_COMMUNITY): Payer: Self-pay | Admitting: *Deleted

## 2024-06-12 ENCOUNTER — Emergency Department (HOSPITAL_COMMUNITY)

## 2024-06-12 ENCOUNTER — Other Ambulatory Visit: Payer: Self-pay

## 2024-06-12 DIAGNOSIS — K9423 Gastrostomy malfunction: Secondary | ICD-10-CM | POA: Diagnosis present

## 2024-06-12 DIAGNOSIS — Z7901 Long term (current) use of anticoagulants: Secondary | ICD-10-CM | POA: Insufficient documentation

## 2024-06-12 DIAGNOSIS — Z79899 Other long term (current) drug therapy: Secondary | ICD-10-CM | POA: Diagnosis not present

## 2024-06-12 MED ORDER — IOHEXOL 300 MG/ML  SOLN
20.0000 mL | Freq: Once | INTRAMUSCULAR | Status: AC | PRN
  Administered 2024-06-12: 20 mL

## 2024-06-12 NOTE — ED Triage Notes (Addendum)
 BIB live-in caregiver from home for g-tube leaking. Onset noticed yesterday am. Last replaced here in ED byu EDP on 11/5. Denies fever, NV, or pain. Alert, NAD, calm, interactive, resps e/u. Skin W&D. Sitting in w/c.

## 2024-06-12 NOTE — ED Provider Notes (Signed)
 [place] EMERGENCY DEPARTMENT AT [facility] Provider Note   CSN: [id number] Arrival date & time: 06/12/24  1055     Patient presents with: g-tube leaking   Tara [name] is a 73 y.o. female presents with G-tube problem, caregiver reports that when she was trying to get off the dressing today she accidentally cut the tube and now it is leaking.  They need to replace.  Currently has 43 French in place.  This was placed due to poor feedings.  No other complication.   HPI     Prior to Admission medications   Medication Sig Start Date End Date Taking? Authorizing Provider  albuterol  (VENTOLIN  HFA) 108 (90 Base) MCG/ACT inhaler Inhale 2 puffs into the lungs every 6 (six) hours as needed for wheezing or shortness of breath. 02/20/24   Ngetich, Dinah C, NP  Amantadine HCl 100 MG tablet Take by mouth. 10/01/23   [provider]  apixaban  (ELIQUIS ) 5 MG TABS tablet Take 1 tablet (5 mg total) by mouth 2 (two) times daily. 04/01/24   Fenton, Clint R, PA  baclofen  5 MG TABS Take 1 tablet (5 mg total) by mouth 3 (three) times daily. 06/03/24   Ngetich, Dinah C, NP  brimonidine -timolol  (COMBIGAN ) 0.2-0.5 % ophthalmic solution Place 1 drop into both eyes every 12 (twelve) hours. 11/04/23   Norleen Lynwood ORN, MD  Cholecalciferol  (VITAMIN D -3 PO) Take 1 tablet by mouth every morning.    [provider]  clorazepate  (TRANXENE ) 7.5 MG tablet Take 1 tablet (7.5 mg total) by mouth 2 (two) times daily as needed for anxiety (for anxiety). 04/20/24   Ngetich, Dinah C, NP  desvenlafaxine  (PRISTIQ ) 100 MG 24 hr tablet Take 1 tablet (100 mg total) by mouth at bedtime. 02/20/24   Ngetich, Dinah C, NP  diltiazem  (CARDIZEM ) 60 MG tablet Take 1 tablet (60 mg total) by mouth every 8 (eight) hours as needed. TAKE 1 TABLET BY FEEDING TUBE EVERY 8 HOURS AS NEEDED 02/20/24   Ngetich, Dinah C, NP  gabapentin  (NEURONTIN ) 100 MG capsule Take 1 capsule (100 mg total) by mouth 3 (three) times daily. 02/20/24    Ngetich, Dinah C, NP  hydrALAZINE  (APRESOLINE ) 100 MG tablet Place 1 tablet (100 mg total) into feeding tube 3 (three) times daily. 02/20/24   Ngetich, Dinah C, NP  ipratropium-albuterol  (DUONEB) 0.5-2.5 (3) MG/3ML SOLN Inhale 3 mLs into the lungs every 6 (six) hours as needed. Inhale into the lungs. 02/20/24   Ngetich, Dinah C, NP  isosorbide  dinitrate (ISORDIL ) 10 MG tablet Take 1 tablet (10 mg total) by mouth 3 (three) times daily. 02/20/24   Ngetich, Dinah C, NP  latanoprost  (XALATAN ) 0.005 % ophthalmic solution Place 1 drop into both eyes at bedtime. 03/26/23   Norleen Lynwood ORN, MD  Multiple Vitamins-Minerals (AIRBORNE PO) Take by mouth in the morning and at bedtime.    [provider]  nitrofurantoin , macrocrystal-monohydrate, (MACROBID ) 100 MG capsule Take 1 capsule (100 mg total) by mouth at bedtime. Suppression dosing to prevent a bladder infection 10/16/23   Glennon Almarie POUR, MD  Nutritional Supplements (NUTREN 1.5 PO) Take by mouth. 1.5 @ 41ml/hr every 24 hours    [provider]  nystatin  ointment (MYCOSTATIN ) Apply 1 Application topically 2 (two) times daily.    [provider]  omeprazole  (PRILOSEC) 40 MG capsule Take 1 capsule (40 mg total) by mouth daily. 02/20/24   Ngetich, Dinah C, NP  UNABLE TO FIND Miscellaneous- Compression stockings bilaterally during the day,  left leg brace applied during the day prior to getting up, and bilateral boot for heels at night    [provider]  valbenazine  (INGREZZA ) 40 MG capsule Take 40 mg by mouth daily.    [provider]  zaleplon (SONATA) 10 MG capsule Take 20 mg by mouth at bedtime. 02/15/23   [provider]    Allergies: Sulfa antibiotics, Fluoxetine hcl, Cocoa, Eicosapentaenoic acid, and Omega-3 fatty acids (plant)    Review of Systems  Gastrointestinal:  Positive for abdominal pain.    Updated Vital Signs BP 106/86 (BP Location: Left Arm)   Pulse 60   Temp 97.6 F (36.4 C) (Oral)   Resp  16   Wt 87.5 kg   SpO2 99%   BMI 31.15 kg/m   Physical Exam Vitals and nursing note reviewed.  Constitutional:      General: She is not in acute distress.    Appearance: She is not toxic-appearing.  HENT:     Head: Normocephalic and atraumatic.  Eyes:     General: No scleral icterus.    Conjunctiva/sclera: Conjunctivae normal.  Cardiovascular:     Rate and Rhythm: Normal rate and regular rhythm.     Pulses: Normal pulses.     Heart sounds: Normal heart sounds.  Pulmonary:     Effort: Pulmonary effort is normal. No respiratory distress.     Breath sounds: Normal breath sounds.  Abdominal:     General: Abdomen is flat. Bowel sounds are normal.     Palpations: Abdomen is soft.     Tenderness: There is no abdominal tenderness.     Comments: G tube in place, tract normal appearing, no surrounding cellulitis  Skin:    General: Skin is warm and dry.     Findings: No lesion.  Neurological:     General: No focal deficit present.     Mental Status: She is alert and oriented to person, place, and time. Mental status is at baseline.     (all labs ordered are listed, but only abnormal results are displayed) Labs Reviewed - No data to display  EKG: None  Radiology: DG Abdomen PEG Tube Location Result Date: 06/12/2024 CLINICAL DATA:  Gastrostomy tube bowel function.  Check placement. EXAM: ABDOMEN - 1 VIEW COMPARISON:  None Available. FINDINGS: Oral contrast material from percutaneous gastrostomy tube is seen within the proximal to mid stomach. No evidence of contrast leak or extravasation. The bowel gas pattern is normal. IMPRESSION: Percutaneous gastrostomy tube within the proximal to mid stomach. No evidence of contrast leak. Electronically Signed   By: Norleen DELENA Kil M.D.   On: 06/12/2024 13:46     .Gastrostomy tube replacement  Date/Time: 06/12/2024 12:36 PM  Performed by: Shermon Warren SAILOR, PA-C Authorized by: Shermon Warren SAILOR, PA-C  Consent: Verbal consent  obtained Consent given by: patient and guardian Patient understanding: patient states understanding of the procedure being performed Patient consent: the patient's understanding of the procedure matches consent given Procedure consent: procedure consent matches procedure scheduled Relevant documents: relevant documents present and verified Test results: test results available and properly labeled Site marked: the operative site was marked Imaging studies: imaging studies available Patient identity confirmed: verbally with patient and arm band Time out: Immediately prior to procedure a time out was called to verify the correct patient, procedure, equipment, support staff and site/side marked as required. Local anesthesia used: no  Anesthesia: Local anesthesia used: no  Sedation: Patient sedated: no  Patient tolerance: patient tolerated the procedure  well with no immediate complications Comments: 21F      Medications Ordered in the ED  iohexol  (OMNIPAQUE ) 300 MG/ML solution 20 mL (20 mLs Per Tube Contrast Given 06/12/24 1325)                                    Medical Decision Making Amount and/or Complexity of Data Reviewed Radiology: ordered.  Risk Prescription drug management.   This patient presents to the ED for concern of g tube problem, this involves an extensive number of treatment options, and is a complaint that carries with it a high risk of complications and morbidity.  The differential diagnosis includes g tube displacement    Imaging Studies ordered:  I ordered imaging studies including Abd x-ray  I independently visualized and interpreted imaging which placement, no leak I agree with the radiologist interpretation   Cardiac Monitoring: / EKG:  The patient was maintained on a cardiac monitor.     Problem List / ED Course / Critical interventions / Medication management  Patient presents to emergency room with complaint of G-tube problem.  They  would like it replaced.  Patient consented for procedure G-tube was replaced without complication.  Confirmed placement with x-ray w/ contrast.  Signs are stable, well-appearing no other complaints feel stable for discharge with close outpatient follow-up.        Final diagnoses:  Gastrostomy tube dysfunction [facility])    ED Discharge Orders     None          Shermon Warren SAILOR, PA-C 06/12/24 1354    Gennaro Bouchard L, DO 06/13/24 [phone]

## 2024-06-12 NOTE — Discharge Instructions (Addendum)
 Your G-tube was replaced here.  Please return to emergency room with new or worsening symptoms and follow-up with your GI team.  You can call [facility] or [facility] for follow up.

## 2024-06-12 NOTE — ED Notes (Signed)
 Pt caregiver at bedside demanding pt be placed in a room. Explained to her that we don't have any rooms available at the moment. She pointed out room 12 was empty, I had to explain to her that the pt doesn't require a room at this time and we are anticipating using that room for a triage or EMS pt. She proceeded to go back to the pt and tell her that if she had come in by EMS then she would have been placed in a room. Several staff members heard this and immediately attempted to correct her by telling her that comment was inappropriate as well as incorrect. Caregiver argued with staff, RN stated Do not tell her that she's not in a room just because she didn't come in by EMS. That is not correct. Caregiver insisted to pt that she should have come by EMS so she would be in a room. Charge nurse heard conversation and is aware of caregivers behavior.

## 2024-06-18 NOTE — Telephone Encounter (Signed)
 Medication Access Center Summary  PA Status Approved  Medication AUSTEDO 12MG  tablets   PA Approval Dates   PA Number 06/18/24 - 07/15/25  [id number]   Provider Deepal Shah-Zamora  Insurance Company [employer]   Approval letter will be saved to Media tab upon receipt.  Initial authorization info received via covermymeds

## 2024-06-24 NOTE — Telephone Encounter (Signed)
 I spoke to Lauren at pharmacy to confirm receipt of the new prescriptions at 427 pm today.  Electronically signed by: Ginnie Nicolas Oar, RN 06/24/2024 4:27 PM

## 2024-06-24 NOTE — Telephone Encounter (Signed)
 I put in separate orders so that she can : Start with 6mg  BID for 14 days (order #1)  then 9mg  BID for 14 days (order #2)  then 12mg  BID till follow up (order #3)

## 2024-06-24 NOTE — Telephone Encounter (Signed)
 Tara [name] with [facility] is calling regarding the prescription for [place]. She states the titration kit for twice daily dosing is no longer available. If doctor wants this, they would need to send in two individual prescriptions for twice daily dosing or if doctor would like, they can change to extended release titration kit as it is still available.  Ph: [phone]

## 2024-06-25 ENCOUNTER — Other Ambulatory Visit: Payer: Self-pay | Admitting: Physician Assistant

## 2024-06-25 ENCOUNTER — Other Ambulatory Visit (HOSPITAL_COMMUNITY): Payer: Self-pay | Admitting: *Deleted

## 2024-06-25 DIAGNOSIS — I480 Paroxysmal atrial fibrillation: Secondary | ICD-10-CM

## 2024-07-02 ENCOUNTER — Other Ambulatory Visit: Payer: Self-pay | Admitting: Family

## 2024-07-02 ENCOUNTER — Other Ambulatory Visit: Payer: Self-pay | Admitting: Internal Medicine

## 2024-07-02 MED ORDER — LATANOPROST 0.005 % OP SOLN: 1.0000 [drp] | Freq: Every day | OPHTHALMIC | 0 refills | Status: DC

## 2024-07-02 NOTE — Telephone Encounter (Signed)
 Copied from CRM #[id number]. Topic: Clinical - Medication Refill >> Jul 02, 2024  2:55 PM DeAngela L wrote: Medication: latanoprost  (XALATAN ) 0.005 % ophthalmic solution The patient usually have 3 bottles at a time when she got her previous prescription refills   Has the patient contacted their pharmacy? Yes  (Agent: If no, request that the patient contact the pharmacy for the refill. If patient does not wish to contact the pharmacy document the reason why and proceed with request.) (Agent: If yes, when and what did the pharmacy advise?)  This is the patient's preferred pharmacy:  [id number] - [place], [place] - [address] AT [address] [place] [address] Phone: [phone] Fax: [phone]  Is this the correct pharmacy for this prescription? Yes  If no, delete pharmacy and type the correct one.   Has the prescription been filled recently? Yes   Is the patient out of the medication? Yes   Has the patient been seen for an appointment in the last year OR does the patient have an upcoming appointment? Yes   Can we respond through MyChart? Yes   Agent: Please be advised that Rx refills may take up to 3 business days. We ask that you follow-up with your pharmacy.

## 2024-07-02 NOTE — Telephone Encounter (Signed)
 The patient usually have 3 bottles at a time when she got her previous prescription refills

## 2024-07-03 ENCOUNTER — Other Ambulatory Visit: Payer: Self-pay | Admitting: Adult Health

## 2024-07-04 ENCOUNTER — Other Ambulatory Visit: Payer: Self-pay | Admitting: Adult Health

## 2024-07-22 ENCOUNTER — Ambulatory Visit: Admitting: Family

## 2024-07-23 ENCOUNTER — Ambulatory Visit: Admitting: Family

## 2024-07-23 ENCOUNTER — Encounter: Payer: Self-pay | Admitting: Family

## 2024-07-23 VITALS — BP 126/76 | HR 64 | Temp 97.4°F | Resp 18 | Ht 66.0 in | Wt 186.0 lb

## 2024-07-23 DIAGNOSIS — F320 Major depressive disorder, single episode, mild: Secondary | ICD-10-CM | POA: Diagnosis not present

## 2024-07-23 DIAGNOSIS — F411 Generalized anxiety disorder: Secondary | ICD-10-CM

## 2024-07-23 DIAGNOSIS — K5901 Slow transit constipation: Secondary | ICD-10-CM | POA: Diagnosis not present

## 2024-07-23 DIAGNOSIS — G20B1 Parkinson's disease with dyskinesia, without mention of fluctuations: Secondary | ICD-10-CM

## 2024-07-23 DIAGNOSIS — R1310 Dysphagia, unspecified: Secondary | ICD-10-CM | POA: Diagnosis not present

## 2024-07-23 DIAGNOSIS — E559 Vitamin D deficiency, unspecified: Secondary | ICD-10-CM | POA: Diagnosis not present

## 2024-07-23 DIAGNOSIS — I10 Essential (primary) hypertension: Secondary | ICD-10-CM

## 2024-07-23 DIAGNOSIS — I482 Chronic atrial fibrillation, unspecified: Secondary | ICD-10-CM | POA: Diagnosis not present

## 2024-07-23 DIAGNOSIS — N184 Chronic kidney disease, stage 4 (severe): Secondary | ICD-10-CM

## 2024-07-23 DIAGNOSIS — E7800 Pure hypercholesterolemia, unspecified: Secondary | ICD-10-CM

## 2024-07-23 MED ORDER — CLORAZEPATE DIPOTASSIUM 7.5 MG PO TABS: 7.5000 mg | ORAL_TABLET | Freq: Two times a day (BID) | ORAL | Status: AC

## 2024-07-31 ENCOUNTER — Other Ambulatory Visit: Payer: Self-pay | Admitting: Family

## 2024-08-02 NOTE — Progress Notes (Signed)
 "  Provider: Rebbie Lauricella FNP-C   Stanly Si, Roxan BROCKS, NP  Patient Care Team: Milan Perkins, Roxan BROCKS, NP as PCP - General (Family Medicine) Alvan Ronal BRAVO, MD (Inactive) as PCP - Cardiology (Cardiology) Patrcia Sharper, MD as Consulting Physician (Ophthalmology)  Extended Emergency Contact Information Primary Emergency Contact: Arleen Rueben [place] Mobile Phone: [phone] Relation: Other Secondary Emergency Contact: Weiss,Patricia Address: [address] [place] [address] [place] Home Phone: [phone] Mobile Phone: [phone] Relation: Sister  Code Status: Full code Goals of care: Advanced Directive information    06/12/2024   11:52 AM  Advanced Directives  Does Patient Have a Medical Advance Directive? No     Chief Complaint  Patient presents with   Form Completion    FL2 request      History of Present Illness   Tara [name] is a 74 year old female with Parkinson's disease who presents for medication management and preparation for admission to a care facility.  She experiences difficulty with sleep, which is influenced by the severity of her involuntary movements. When the movements are less severe, she is able to sleep, but when they are more intense, they override the effects of her medication. She is currently taking clonazepam 7.5 mg twice daily for anxiety and Sonata 20 mg at bedtime for sleep.  Her medication regimen includes Austedo, currently at 9 mg with plans to increase to 12 mg, albuterol  inhaler as needed, baclofen  5 mg three times daily, and Combigan  eye drops. She has not seen an eye doctor recently but has an appointment scheduled. Additionally, she takes diltiazem  60 mg twice daily for blood pressure, Eliquis  5 mg twice daily, gabapentin  100 mg three times daily, hydralazine  100 mg twice daily, and omeprazole  40 mg daily. She is also on nitrofurantoin  100 mg at bedtime, nystatin  ointment twice daily, and a  multivitamin twice daily.  She has a history of constipation, for which she uses watermelon and milk of magnesia 30 mL as needed. She also experiences difficulty swallowing and is on a tube feeding regimen with Neutrin 1.5, administered continuously with water  flushes every six hours.   Past Medical History:  Diagnosis Date   Anemia    on meds   ANKLE PAIN, LEFT 04/01/2008   ANXIETY 04/17/2007   on meds   Arthritis    generalized   Cataract    bilateral sx   Chronic kidney disease    stage 3   Colon polyp    COLONIC POLYPS, HX OF 08/01/2007   CONTUSIONS, MULTIPLE 04/01/2009   DEPRESSION 04/17/2007   on meds   DIZZINESS 08/01/2007   DYSPNEA 08/01/2007   with exertion   Enlargement of lymph nodes 08/13/2007   Excessive involuntary blinking    per pt,going on since 2018   Glaucoma    on meds   GLUCOSE INTOLERANCE 08/01/2007   Hypercalcemia due to sarcoidosis 2014   HYPERLIPIDEMIA 08/01/2007   no meds   HYPERSOMNIA 07/28/2008   HYPERTENSION 04/17/2007   Impaired glucose tolerance 03/23/2011   JOINT EFFUSION, LEFT KNEE 06/02/2010   Loose body in knee 04/01/2009   pt not sure?   Metabolic encephalopathy 12/09/2015-12/2015   Migraines    stopped 3-4 yr ago (07/23/2012)   Morbid obesity (HCC) 04/20/2007   OTHER DISEASES OF LUNG NOT ELSEWHERE CLASSIFIED 08/01/2007   Pain in joint, lower leg 04/01/2009   PERIPHERAL EDEMA 04/21/2009   Pre-diabetes  Sarcoidosis 09/25/2007   Seasonal allergies    SHOULDER PAIN, LEFT 04/01/2009   Sleep apnea    does not use Cpap   Past Surgical History:  Procedure Laterality Date   BREAST SURGERY     Biopsy benign/bil breaST   CARDIOVERSION N/A 12/15/2021   Procedure: CARDIOVERSION;  Surgeon: Raford Riggs, MD;  Location: [facility] ENDOSCOPY;  Service: Cardiovascular;  Laterality: N/A;   CARDIOVERSION N/A 02/09/2022   Procedure: CARDIOVERSION;  Surgeon: Mona Vinie BROCKS, MD;  Location: [facility] ENDOSCOPY;  Service: Cardiovascular;  Laterality:  N/A;   CATARACT EXTRACTION     RIGHT EYE   COLONOSCOPY  2020   KN-MAC-suprep(good)-tics/TA's   COMBINED MEDIASTINOSCOPY AND BRONCHOSCOPY  08/2007   COMBINED MEDIASTINOSCOPY AND BRONCHOSCOPY  2009   Dental implant     FRACTURE SURGERY  ?02/1997   left upper arm; put rod in (07/23/2012)   GUM SURGERY  2000-?2009   several ORs; soft tissue graft; took material from roof of mouth (07/23/2012   HUMERUS SURGERY Left 1998   rod insertion   IR GASTROSTOMY TUBE MOD SED  06/22/2022   IR REPLC GASTRO/COLONIC TUBE PERCUT W/FLUORO  10/25/2022   KNEE ARTHROSCOPY  03/2004; 06/2009   right; left Dr. Gerome   KNEE SURGERY  2012   ARTHROSCOPIC LEFT KNEE   LYMPH NODE BIOPSY  ~ 2009   for sarcoidosis; don't know exactly which nodes (07/23/2102)   MYOMECTOMY  1994   Open   POLYPECTOMY  2020   TA's   REFRACTIVE SURGERY  08/1998   both eyes (07/23/2012)   REFRACTIVE SURGERY  2000   Bil   TOTAL KNEE ARTHROPLASTY Left 09/24/2016   Procedure: TOTAL KNEE ARTHROPLASTY;  Surgeon: Yvone Rush, MD;  Location: [facility] OR;  Service: Orthopedics;  Laterality: Left;    Allergies[1]  Allergies as of 07/23/2024       Reactions   Sulfa Antibiotics Rash   Fluoxetine Hcl Other (See Comments)   (PROZAC) Suicidal thoughts   Cocoa    Eicosapentaenoic Acid    Omega-3 Fatty Acids (plant)         Medication List        Accurate as of July 23, 2024 11:59 PM. If you have any questions, ask your nurse or doctor.          STOP taking these medications    valbenazine  40 MG capsule Commonly known as: INGREZZA  Stopped by: Roxan Plough, NP       TAKE these medications    Amantadine HCl 100 MG tablet Take by mouth. The timing of this medication is very important.   AIRBORNE PO Take by mouth in the morning and at bedtime.   albuterol  108 (90 Base) MCG/ACT inhaler Commonly known as: VENTOLIN  HFA Inhale 2 puffs into the lungs every 6 (six) hours as needed for wheezing or shortness of breath.    Baclofen  5 MG Tabs Take 1 tablet (5 mg total) by mouth 3 (three) times daily.   brimonidine -timolol  0.2-0.5 % ophthalmic solution Commonly known as: Combigan  Place 1 drop into both eyes every 12 (twelve) hours.   clorazepate  7.5 MG tablet Commonly known as: TRANXENE  Take 1 tablet (7.5 mg total) by mouth 2 (two) times daily. What changed:  when to take this reasons to take this Changed by: Roxan Plough, NP   desvenlafaxine  100 MG 24 hr tablet Commonly known as: PRISTIQ  Take 1 tablet (100 mg total) by mouth at bedtime.   Desvenlafaxine  ER 100 MG Tb24 Take 100 mg by mouth  at bedtime.   Austedo 9 MG Tabs Generic drug: Deutetrabenazine Take 1 tablet by mouth 2 (two) times daily. X 14 days ending on Wednesday 07/29/24 and to begin 12 mg   Deutetrabenazine 12 MG Tabs Take 12 mg by mouth in the morning and at bedtime. Will begin on Thursday 07/30/24   diltiazem  60 MG tablet Commonly known as: CARDIZEM  Take 60 mg by mouth 2 (two) times daily. What changed: Another medication with the same name was removed. Continue taking this medication, and follow the directions you see here. Changed by: Roxan Plough, NP   Eliquis  5 MG Tabs tablet Generic drug: apixaban  TAKE 1 TABLET(5 MG) BY MOUTH TWICE DAILY   gabapentin  100 MG capsule Commonly known as: NEURONTIN  Take 1 capsule (100 mg total) by mouth 3 (three) times daily.   hydrALAZINE  100 MG tablet Commonly known as: APRESOLINE  Place 1 tablet (100 mg total) into feeding tube 3 (three) times daily. What changed: when to take this   ipratropium-albuterol  0.5-2.5 (3) MG/3ML Soln Commonly known as: DUONEB Inhale 3 mLs into the lungs every 6 (six) hours as needed. Inhale into the lungs.   isosorbide  dinitrate 10 MG tablet Commonly known as: ISORDIL  Take 1 tablet (10 mg total) by mouth 3 (three) times daily.   latanoprost  0.005 % ophthalmic solution Commonly known as: XALATAN  INSTILL 1 DROP IN BOTH EYES AT BEDTIME   magnesium   hydroxide 400 MG/5ML suspension Commonly known as: MILK OF MAGNESIA Take 30 mLs by mouth daily as needed for mild constipation.   nitrofurantoin  (macrocrystal-monohydrate) 100 MG capsule Commonly known as: MACROBID  Take 1 capsule (100 mg total) by mouth at bedtime. Suppression dosing to prevent a bladder infection   NUTREN 1.5 PO Take by mouth. 1.5 @ 38ml/hr every 24 hours   nystatin  ointment Commonly known as: MYCOSTATIN  Apply 1 Application topically 2 (two) times daily.   omeprazole  40 MG capsule Commonly known as: PRILOSEC Take 1 capsule (40 mg total) by mouth daily.   UNABLE TO FIND Miscellaneous- Compression stockings bilaterally during the day, left leg brace applied during the day prior to getting up, and bilateral boot for heels at night   VITAMIN D -3 PO Take 1 tablet by mouth every morning.   zaleplon 10 MG capsule Commonly known as: SONATA Take 20 mg by mouth at bedtime.        Review of Systems  Unable to perform ROS: Other (Additional HPI information provided by caregiver)  Constitutional:  Negative for appetite change, chills, fatigue, fever and unexpected weight change.  HENT:  Negative for congestion, dental problem, ear discharge, ear pain, facial swelling, hearing loss, nosebleeds, postnasal drip, rhinorrhea, sinus pressure, sinus pain, sneezing, sore throat, tinnitus and trouble swallowing.   Eyes:  Negative for pain, discharge, redness, itching and visual disturbance.  Respiratory:  Negative for cough, chest tightness, shortness of breath and wheezing.   Cardiovascular:  Negative for chest pain, palpitations and leg swelling.  Gastrointestinal:  Negative for abdominal distention, abdominal pain, blood in stool, constipation, diarrhea, nausea and vomiting.  Endocrine: Negative for cold intolerance, heat intolerance, polydipsia, polyphagia and polyuria.  Genitourinary:  Negative for difficulty urinating, dysuria, flank pain, frequency and urgency.   Musculoskeletal:  Negative for arthralgias, back pain, gait problem, joint swelling, myalgias, neck pain and neck stiffness.  Skin:  Negative for color change, pallor, rash and wound.  Neurological:  Negative for dizziness, syncope, speech difficulty, weakness, light-headedness and headaches.       Involuntary movements  Psychiatric/Behavioral:  Negative  for agitation, behavioral problems, confusion, hallucinations, self-injury, sleep disturbance and suicidal ideas. The patient is not nervous/anxious.     Immunization History  Administered Date(s) Administered   Fluad Quad(high Dose 65+) 03/11/2019, 05/27/2020, 04/11/2021   Fluad Trivalent(High Dose 65+) 08/29/2023   INFLUENZA, HIGH DOSE SEASONAL PF 03/27/2018, 05/25/2019   Influenza Split 03/29/2013   Influenza Whole 05/09/1999, 05/19/2007, 04/01/2008, 04/21/2009, 03/26/2011   Influenza,inj,Quad PF,6+ Mos 03/25/2014, 03/27/2016, 06/13/2017   Influenza-Unspecified 04/15/2012, 04/14/2015   Moderna Sars-Covid-2 Vaccination 08/28/2019, 09/25/2019, 11/30/2019, 05/27/2020, 11/25/2020   Pneumococcal Conjugate-13 03/18/2020   Pneumococcal Polysaccharide-23 06/05/2013, 10/10/2021   Td 07/16/1992, 10/21/2008   Tdap 03/18/2020   Zoster, Live 12/14/2012   Pertinent  Health Maintenance Due  Topic Date Due   OPHTHALMOLOGY EXAM  03/10/2023   HEMOGLOBIN A1C  02/26/2024   Colonoscopy  11/22/2024   FOOT EXAM  12/02/2024   Influenza Vaccine  Completed   Bone Density Scan  Completed   Mammogram  Discontinued      04/01/2023   10:09 AM 08/29/2023   11:08 AM 12/03/2023    3:54 PM 12/10/2023    2:47 PM 04/01/2024    2:04 PM  Fall Risk  Falls in the past year? 0 0 0 0 0  Was there an injury with Fall? 0  0  0  0  0   Fall Risk Category Calculator 0 0 0 0 0  Patient at Risk for Falls Due to No Fall Risks No Fall Risks Impaired balance/gait Impaired balance/gait No Fall Risks  Fall risk Follow up Falls evaluation completed Falls evaluation completed  Falls evaluation completed;Falls prevention discussed Falls evaluation completed Falls evaluation completed     Data saved with a previous flowsheet row definition   Functional Status Survey:    Vitals:   07/23/24 1039  BP: 126/76  Pulse: 64  Resp: 18  Temp: (!) 97.4 F (36.3 C)  SpO2: 90%  Weight: 186 lb (84.4 kg)  Height: 5' 6 (1.676 m)   Body mass index is 30.02 kg/m. Physical Exam  GENERAL: Alert, cooperative, well developed, no acute distress. HEENT: Normocephalic, normal oropharynx, moist mucous membranes. CHEST: Clear to auscultation bilaterally, no wheezes, rhonchi, or crackles. CARDIOVASCULAR: Normal heart rate and rhythm, S1 and S2 normal without murmurs. ABDOMEN: Soft, non-tender, non-distended, without organomegaly, normal bowel sounds. EXTREMITIES: No cyanosis, edema, or swelling. NEUROLOGICAL: Cranial nerves grossly intact, moves all extremities without gross motor or sensory deficit. SKIN: Normal, no sores or rash.  SKIN: No rash,no lesion or erythema   PSYCHIATRY/BEHAVIORAL: Mood stable   Labs reviewed: Recent Labs    12/10/23 1541  NA 141  K 4.0  CL 104  CO2 27  GLUCOSE 71  BUN 16  CREATININE 1.07*  CALCIUM  9.7   Recent Labs    12/10/23 1541  AST 19  ALT 18  BILITOT 0.7  PROT 6.5   Recent Labs    12/10/23 1541  WBC 6.9  NEUTROABS 4,451  HGB 15.3  HCT 46.6*  MCV 87.6  PLT 207   Lab Results  Component Value Date   TSH 1.46 12/10/2023   Lab Results  Component Value Date   HGBA1C 5.3 08/29/2023   Lab Results  Component Value Date   CHOL 157 12/10/2023   HDL 45 (L) 12/10/2023   LDLCALC 83 12/10/2023   LDLDIRECT 59.0 03/07/2023   TRIG 194 (H) 12/10/2023   CHOLHDL 3.5 12/10/2023    Significant Diagnostic Results in last 30 days:  No results found.  Assessment/Plan  Parkinson's disease with dyskinesia Managed with Austedo, transitioning from 9 mg to 12 mg. Involuntary movements affect sleep quality. Clonazepam adjusted  to twice daily for anxiety management. - Continue Austedo, transitioning to 12 mg as planned. - Adjusted clonazepam to twice daily for anxiety management.  Dysphagia Persists, requiring assistance with feeding and hydration. Caregiver to provide feeding by mouth only, including medication and two feedings with feeding flush. - Caregiver to provide feeding by mouth only, including medication and two feedings with feeding flush.  Chronic atrial fibrillation Managed with Eliquis  5 mg twice daily. - Continue Eliquis  5 mg twice daily.  Essential hypertension Managed with diltiazem  and hydralazine . Blood pressure elevated in the afternoon due to physical activity. - Continue diltiazem  60 mg twice daily. - Continue hydralazine  100 mg twice daily.  Major depressive disorder Managed with Pristiq  100 mg at bedtime. - Continue Pristiq  100 mg at bedtime.  Generalized anxiety disorder Managed with clonazepam, adjusted to twice daily for better control. - Continue clonazepam twice daily for anxiety management.  Chronic kidney disease stage 4 - Continue current management.  Slow transit constipation Managed with dietary modifications and milk of magnesia as needed. - Added milk of magnesia to FL2 form for constipation management.  Vitamin D  deficiency - Continue multivitamin supplementation.   Family/ staff Communication: Reviewed plan of care with patient and caregiver verbalized understanding  Labs/tests ordered: None   Next Appointment : Return if symptoms worsen or fail to improve.   Spent 30 minutes of Face to face and non-face to face with patient  >50% time spent counseling; reviewing medical record; tests; labs; documentation and developing future plan of care.   Roxan BROCKS Sadye Kiernan, NP      [1]  Allergies Allergen Reactions   Sulfa Antibiotics Rash   Fluoxetine Hcl Other (See Comments)    (PROZAC) Suicidal thoughts   Cocoa    Eicosapentaenoic Acid    Omega-3 Fatty  Acids (Plant)    "

## 2024-08-03 NOTE — Addendum Note (Signed)
 Addended by: RENELLA KEEN on: 08/03/2024 09:34 AM   Modules accepted: Orders

## 2024-09-08 ENCOUNTER — Ambulatory Visit: Admitting: Nurse Practitioner

## 2024-09-23 ENCOUNTER — Other Ambulatory Visit: Payer: Self-pay

## 2024-09-30 ENCOUNTER — Ambulatory Visit: Payer: Self-pay | Admitting: Family

## 2024-12-03 ENCOUNTER — Ambulatory Visit
